# Patient Record
Sex: Male | Born: 1940 | Race: White | Hispanic: No | Smoking: Former smoker
Health system: Southern US, Community
[De-identification: ages and names within clinical notes are randomized; demographics above are authoritative.]

## PROBLEM LIST (undated history)

## (undated) DIAGNOSIS — I499 Cardiac arrhythmia, unspecified: Secondary | ICD-10-CM

## (undated) DIAGNOSIS — N400 Enlarged prostate without lower urinary tract symptoms: Secondary | ICD-10-CM

## (undated) DIAGNOSIS — G4752 REM sleep behavior disorder: Secondary | ICD-10-CM

## (undated) DIAGNOSIS — C7951 Secondary malignant neoplasm of bone: Secondary | ICD-10-CM

## (undated) DIAGNOSIS — C801 Malignant (primary) neoplasm, unspecified: Secondary | ICD-10-CM

## (undated) DIAGNOSIS — G4730 Sleep apnea, unspecified: Secondary | ICD-10-CM

## (undated) DIAGNOSIS — G20A1 Parkinson's disease without dyskinesia, without mention of fluctuations: Secondary | ICD-10-CM

## (undated) DIAGNOSIS — M8088XA Other osteoporosis with current pathological fracture, vertebra(e), initial encounter for fracture: Secondary | ICD-10-CM

## (undated) DIAGNOSIS — C61 Malignant neoplasm of prostate: Secondary | ICD-10-CM

## (undated) DIAGNOSIS — I4892 Unspecified atrial flutter: Secondary | ICD-10-CM

## (undated) DIAGNOSIS — G20 Parkinson's disease: Secondary | ICD-10-CM

## (undated) HISTORY — PX: COLONOSCOPY: SHX174

## (undated) HISTORY — DX: REM sleep behavior disorder: G47.52

## (undated) HISTORY — DX: Sleep apnea, unspecified: G47.30

## (undated) HISTORY — PX: APPENDECTOMY: SHX54

---

## 2001-08-17 ENCOUNTER — Ambulatory Visit (HOSPITAL_COMMUNITY): Admission: RE | Admit: 2001-08-17 | Discharge: 2001-08-17 | Payer: Self-pay | Admitting: Gastroenterology

## 2008-08-07 ENCOUNTER — Ambulatory Visit: Payer: Self-pay | Admitting: Internal Medicine

## 2008-10-04 ENCOUNTER — Ambulatory Visit: Payer: Self-pay | Admitting: Gastroenterology

## 2012-01-17 DIAGNOSIS — G4752 REM sleep behavior disorder: Secondary | ICD-10-CM | POA: Insufficient documentation

## 2012-01-17 DIAGNOSIS — G4733 Obstructive sleep apnea (adult) (pediatric): Secondary | ICD-10-CM | POA: Insufficient documentation

## 2013-09-07 DIAGNOSIS — K5790 Diverticulosis of intestine, part unspecified, without perforation or abscess without bleeding: Secondary | ICD-10-CM

## 2013-09-07 HISTORY — DX: Diverticulosis of intestine, part unspecified, without perforation or abscess without bleeding: K57.90

## 2014-01-12 DIAGNOSIS — N400 Enlarged prostate without lower urinary tract symptoms: Secondary | ICD-10-CM | POA: Insufficient documentation

## 2014-05-18 DIAGNOSIS — Z860100 Personal history of colon polyps, unspecified: Secondary | ICD-10-CM | POA: Insufficient documentation

## 2014-06-05 ENCOUNTER — Ambulatory Visit: Payer: Self-pay | Admitting: Gastroenterology

## 2015-05-09 HISTORY — PX: KIDNEY DONATION: SHX685

## 2015-05-20 DIAGNOSIS — Z524 Kidney donor: Secondary | ICD-10-CM | POA: Insufficient documentation

## 2015-06-10 DIAGNOSIS — Z905 Acquired absence of kidney: Secondary | ICD-10-CM | POA: Insufficient documentation

## 2015-11-07 DIAGNOSIS — G20 Parkinson's disease: Secondary | ICD-10-CM | POA: Insufficient documentation

## 2015-11-29 ENCOUNTER — Ambulatory Visit: Payer: Medicare Other | Admitting: Occupational Therapy

## 2015-11-29 ENCOUNTER — Ambulatory Visit: Payer: Medicare Other | Admitting: Physical Therapy

## 2015-12-03 ENCOUNTER — Encounter: Payer: Self-pay | Admitting: Occupational Therapy

## 2015-12-03 ENCOUNTER — Ambulatory Visit: Payer: Medicare Other | Attending: Neurology | Admitting: Physical Therapy

## 2015-12-03 ENCOUNTER — Ambulatory Visit: Payer: Medicare Other | Admitting: Occupational Therapy

## 2015-12-03 DIAGNOSIS — R2689 Other abnormalities of gait and mobility: Secondary | ICD-10-CM | POA: Insufficient documentation

## 2015-12-03 DIAGNOSIS — R293 Abnormal posture: Secondary | ICD-10-CM | POA: Insufficient documentation

## 2015-12-03 DIAGNOSIS — R2681 Unsteadiness on feet: Secondary | ICD-10-CM

## 2015-12-03 DIAGNOSIS — R279 Unspecified lack of coordination: Principal | ICD-10-CM

## 2015-12-03 DIAGNOSIS — R278 Other lack of coordination: Secondary | ICD-10-CM

## 2015-12-03 NOTE — Therapy (Signed)
[name] [name] [address] [place], [place], [address] Phone: [phone]   Fax:  [phone]  Occupational Therapy Evaluation  Patient Details  Name: [name] [name] MRN: [id number] Date of Birth: 21-Nov-1940 Referring Provider: Dr. Erie Noe  Encounter Date: 12/03/2015      OT End of Session - 12/03/15 1301    Visit Number 1   Number of Visits 17   Authorization Type Medicare, Tricare (g-code)   Authorization - Visit Number 1   Authorization - Number of Visits 10   OT Start Time 0850   OT Stop Time 0933   OT Time Calculation (min) 43 min   Activity Tolerance Patient tolerated treatment well   Behavior During Therapy [facility] for tasks assessed/performed      Past Medical History  Diagnosis Date  . Sleep apnea   . REM sleep behavior disorder     Past Surgical History  Procedure Laterality Date  . Appendectomy    . 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  Visit Diagnosis:  Decreased coordination  Unsteadiness on feet  Abnormal posture      Subjective Assessment - 12/03/15 0854    Subjective  Pt reports that he has had PD symptoms for approx 2 years and was diagnosed approx a month ago   Patient Stated Goals improve writing   Currently in Pain? No/denies           [facility] OT Assessment - 12/03/15 0854    Assessment   Diagnosis Parkinsons disease   Referring Provider Dr. Erie Noe   Onset Date --  a month ago diagnosis   Prior Therapy none   Precautions   Precautions Fall   Balance Screen   Has the patient fallen in the past 6 months No   Home  Environment   Family/patient expects to be discharged to: Private residence   Lives With Spouse   Prior Function   Level of Independence Independent with household mobility without device;Independent with community mobility without device;Independent with basic ADLs;Independent with homemaking with ambulation    Vocation Retired   [employer] retired Chief Financial Officer    Leisure Enjoys outside [employer], [place] grass; bill paying/paperwork activities  big walking, sit-ups, barbells for exercise   ADL   Eating/Feeding Modified independent  eating with L hand approx 50% of the time due to tremor   Grooming Modified independent  using both hands for shaving   Grooming Patient Percentage --  difficulty manipulating toothbrush   Upper Body Bathing Modified independent   Lower Body Bathing Modified independent   Upper Body Dressing Increased time  min difficulty with button   Lower Body Dressing Increased time   Engineering geologist -  Hydrographic surveyor Walk in shower   Transfers/Ambulation Related to ADL's mod I   IADL   Shopping Takes care of all shopping needs independently   Prior Level of Function Light Housekeeping --  house cleaners, but pt/wife share simple tasks   Light Housekeeping --  incr time for yard work/home maintence tasks   Prior Level of Function Meal Prep wife performs, but pt helps   Meal Prep --  incr time, particularly with cutting    [place] own vehicle  pt reports tremors R foot, but not limiting driving   Medication Management Is responsible for taking medication in correct dosages at correct  time   Financial Management --  difficulty writing   Mobility   Mobility Status Independent   Written Expression   Dominant Hand Right   Handwriting Increased time  prints more now, reports decr size if writing longer    Written Experience --  pt wrote 3 sentences   Vision - History   Baseline Vision Wears glasses all the time  trifocals   Additional Comments denies changes   Activity Tolerance   Activity Tolerance Comments pt reports incr fatigue   Cognition   Overall Cognitive Status Within Functional Limits for tasks assessed  pt denies changes    Observation/Other Assessments   Observations Posture:  forward head, rounded shoulders, posterior pelvic tilt   Standing Functional Reach Test R-14.5", L-13.5"   Other Surveys  Select   Physical Performance Test   Yes   Simulated Eating Time (seconds) 20.16sec   Donning Doffing Jacket Time (seconds) 13.81   Donning Doffing Jacket Comments Buttoning/unbuttoning 3 buttons on table in 21.25sec   Coordination   9 Hole Peg Test Right;Left   Right 9 Hole Peg Test 40.16sec   Left 9 Hole Peg Test 29.34sec   Box and Blocks R-36blocks, L-45blocks   Coordination Pt demo small amplitudemovements when manipulating items in pocket and picking up jacket (R hand)   Tone   Assessment Location Right Upper Extremity;Left Upper Extremity   ROM / Strength   AROM / PROM / Strength AROM   AROM   Overall AROM  Deficits   Overall AROM Comments BUEs grossly WNL except R shoulder flex 125* with -20* elbow extension (elbow ext improved with cues) which appears to be due to bradykinesia/rigidity (LUE 140* with full elbow ext without cues)   RUE Tone   RUE Tone Mild   LUE Tone   LUE Tone Within Functional Limits                         OT Education - 12/03/15 1252    Education provided Yes   Education Details POC, Eval results   Person(s) Educated Patient   Methods Explanation;Demonstration;Verbal cues   Comprehension Verbalized understanding;Returned demonstration          OT Short Term Goals - 12/03/15 1331    OT SHORT TERM GOAL #1   Title Pt will be independent with PD-specific HEP--check STGs 01/02/16   Time 4   Period Weeks   Status New   OT SHORT TERM GOAL #2   Title Pt will improve R shoulder flex to at least 135 with -10* elbow extension for functional reaching (without cues)   Baseline 125* with -20* elbow ext   Time 4   Period Weeks   Status New   OT SHORT TERM GOAL #3   Title Pt will improve coordination for ADLs as shown by improving time on 9-hole peg test by at  least 5sec with R hand.   Baseline 40.16sec   Time 4   Period Weeks   Status New   OT SHORT TERM GOAL #4   Title Pt will improve functional reaching/coordination for ADLs as shown by improving score on box and blocks test by at least 5 with RUE.   Baseline 36   Time 4   Period Weeks   Status New   OT SHORT TERM GOAL #5   Title Pt will demo incr ease with eating as shown by improving time on PPT#2 by at least 4sec.   Baseline 20.16sec  Time 4   Period Weeks   Status New   Additional Short Term Goals   Additional Short Term Goals Yes   OT SHORT TERM GOAL #6   Title Pt will be able to write 1/2 page with no significant decr in size.   Time 4   Period Weeks   Status New           OT Long Term Goals - 29-Dec-2015 1640    OT LONG TERM GOAL #1   Title Pt will verbalize understanding of adaptive strategies to incr ease of ADLs/IADLs prn.   Time 8   Period Weeks   Status New   OT LONG TERM GOAL #2   Title Pt will demo incr ability to dress as shown by improving PPT#4 by at least 4sec   Baseline 21.25sec   Time 8   Period Weeks   Status New   OT LONG TERM GOAL #3   Title Pt will improve coordination for ADLs as shown by improving time on 9-hole peg test by at least 10sec with R hand.   Baseline 40.16   Time 8   Period Weeks   Status New   OT LONG TERM GOAL #4   Title Pt will improve functional reaching/coordination for ADLs as shown by improving score on box and blocks test by at least 10 with RUE.   Baseline 36   Time 8   Period Weeks   Status New   OT LONG TERM GOAL #5   Title Pt will report eating with dominant RUE at least 90% of the time.   Baseline 50%   Time 8   Period Weeks   Status New   Long Term Additional Goals   Additional Long Term Goals Yes   OT LONG TERM GOAL #6   Title Pt will verbalize understanding of ways to prevent future complications and PD-specific community resources prn.   Time 8   Period Weeks   Status New               Plan  - 2015/12/29 1303    Clinical Impression Statement Pt is a 75 y.o. male recently diagnosed with Parkinsons disease.  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  Pt presents with bradykinesia, rigidity, decr coordination, decr balance for ADLs, abnormal posture.  Pt would benefit from occupational therapy to address these deficits in order to improve ADL/IADL performance, establish PD-specific HEP, improve dominant RUE functional use, amd prevent future complications.   Pt will benefit from skilled therapeutic intervention in order to improve on the following deficits (Retired) Decreased mobility;Impaired UE functional use;Decreased knowledge of use of DME;Decreased balance;Decreased activity tolerance;Impaired tone;Decreased coordination;Decreased range of motion;Improper spinal/pelvic alignment  bradykinesia   Rehab Potential Good   OT Frequency 2x / week   OT Duration 8 weeks  +eval   OT Treatment/Interventions Self-care/ADL training;Neuromuscular education;Balance training;Patient/family education;Therapist, nutritional;Therapeutic exercise;Therapeutic exercises;Therapeutic activities;DME and/or AE instruction;Cryotherapy;Electrical Stimulation;Moist Heat;Passive range of motion;Cognitive remediation/compensation;Ultrasound;Energy conservation;Manual Therapy   Plan initiate PWR! HEP   Consulted and Agree with Plan of Care Patient          G-Codes - 12-29-2015 1648    Functional Assessment Tool Used 9-hole peg test:  R-40.16sec, box and blocks test: 36blocks, only eating with RUE 50% of the time   Functional Limitation Carrying, moving and handling objects   Carrying, Moving and Handling Objects Current Status [id number]) At least 20 percent but less than 40 percent  impaired, limited or restricted   Carrying, Moving and Handling Objects Goal Status [phone]) At least 1 percent but less than 20 percent impaired, limited or restricted      Problem  List There are no active problems to display for this patient.   [facility] 12/03/2015, 4:51 PM  [place] [address] [place], [place], [address] Phone: [phone]   Fax:  [phone]  Name: [name] [name] MRN: [id number] Date of Birth: 1941-09-01  Vianne Bulls, OTR/L [facility] [address] [place], [place]  [phone] phone [phone] 12/03/2015 4:51 PM

## 2015-12-03 NOTE — Therapy (Signed)
[facility] [address] [place], [place], [address] Phone: [phone]   Fax:  [phone]  Physical Therapy Evaluation  Patient Details  Name: [name] [name] MRN: [id number] Date of Birth: 01/23/1941 Referring Provider: Dr. Erie Noe ([place])  Encounter Date: 12/03/2015      PT End of Session - 12/03/15 0950    Visit Number 1   Number of Visits 17   Date for PT Re-Evaluation 02/01/16   Authorization Type Medicare/Tricare 2nd-GCODE every 10th visit; No PTA   PT Start Time 0801   PT Stop Time 0844   PT Time Calculation (min) 43 min   Activity Tolerance Patient tolerated treatment well   Behavior During Therapy [facility] for tasks assessed/performed      No past medical history on file.  No past surgical history on file.  There were no vitals filed for this visit.  Visit Diagnosis:  Other abnormalities of gait and mobility  Unsteadiness on feet  Abnormal posture      Subjective Assessment - 12/03/15 0807    Subjective Pt is a 75 year old male with history of Parkinson's disease, who presents to OP PT with some reports of R foot drag, R foot tremors, R hand tremors.  He was diagnosed with PD about one month ago, despite concerns over the past several years.  He notes some minimal changes in balance, slowed movements in general.   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   Currently in Pain? No/denies            [facility] PT Assessment - 12/03/15 0001    Assessment   Medical Diagnosis REM behavioral disorder; Parkinson's disease   Referring Provider Dr. Erie Noe ([place])   Onset Date/Surgical Date --  one month ago-PD dx   Precautions   Precautions Fall   Balance Screen   Has the patient fallen in the past 6 months No    Has the patient had a decrease in activity level because of a fear of falling?  No   Is the patient reluctant to leave their home because of a fear of falling?  No   Home Environment   Living Environment Private residence   Living Arrangements Spouse/significant other   Available Help at Discharge Family   Type of [place] to enter   Entrance Stairs-Number of Steps 5   Entrance Stairs-Rails Right   [place] Two level   Prior Function   Level of Independence Independent with household mobility without device;Independent with community mobility without device;Independent with basic ADLs   Vocation Retired   Leisure Enjoys outside [employer], mowing grass; Location manager activities   Posture/Postural Control   Posture/Postural Control Postural limitations   Postural Limitations Rounded Shoulders;Forward head;Posterior pelvic tilt   ROM / Strength   AROM / PROM / Strength PROM;Strength   PROM   Overall PROM Comments grossly tested, noted stiffness RLE> LLE   Strength   Overall Strength Comments Grossly tested at least 4/5 throughout lower extremities; pt does note fatigue more quickly with activities than he used to.   Transfers   Transfers Sit to Stand;Stand to Sit   Sit to Stand 6: Modified independent (Device/Increase time);Without upper extremity assist;From chair/3-in-1   Five time sit to stand comments  8.91 sec   Stand to Sit 6: Modified independent (Device/Increase time);Without upper extremity assist;To chair/3-in-1   Ambulation/Gait   Ambulation/Gait Yes   Ambulation/Gait Assistance 7: Independent   Ambulation Distance (Feet) 200 Feet   Assistive device None   Gait Pattern Decreased arm swing - right;Decreased step length - right;Decreased stance time - right;Decreased dorsiflexion - right;Decreased trunk rotation;Narrow base of support;Poor foot clearance - right  R foot scuffs floor with gait   Ambulation Surface Level;Indoor   Gait  velocity 9.54 sec= 3.44 ft/sec   Standardized Balance Assessment   Standardized Balance Assessment Timed Up and Go Test;Four Square Step Test   Timed Up and Go Test   Normal TUG (seconds) 10.74   Manual TUG (seconds) 11.5   Cognitive TUG (seconds) 12.74   Four Square Step Test    Trial One  11.58   Trial Two 10.72   High Level Balance   High Level Balance Comments Push and release test:  posterior and anterior:  pt takes 2 small steps to recover   Functional Gait  Assessment   Gait assessed  Yes   Gait Level Surface Walks 20 ft in less than 7 sec but greater than 5.5 sec, uses assistive device, slower speed, mild gait deviations, or deviates 6-10 in outside of the 12 in walkway width.  6 sec   Change in Gait Speed Able to change speed, demonstrates mild gait deviations, deviates 6-10 in outside of the 12 in walkway width, or no gait deviations, unable to achieve a major change in velocity, or uses a change in velocity, or uses an assistive device.   Gait with Horizontal Head Turns Performs head turns with moderate changes in gait velocity, slows down, deviates 10-15 in outside 12 in walkway width but recovers, can continue to walk.   Gait with Vertical Head Turns Performs task with slight change in gait velocity (eg, minor disruption to smooth gait path), deviates 6 - 10 in outside 12 in walkway width or uses assistive device   Gait and Pivot Turn Pivot turns safely within 3 sec and stops quickly with no loss of balance.   Step Over Obstacle Is able to step over one shoe box (4.5 in total height) but must slow down and adjust steps to clear box safely. May require verbal cueing.   Gait with Narrow Base of Support Ambulates 7-9 steps.   Gait with Eyes Closed Walks 20 ft, slow speed, abnormal gait pattern, evidence for imbalance, deviates 10-15 in outside 12 in walkway width. Requires more than 9 sec to ambulate 20 ft.  9.04 sec; veers to L   Ambulating Backwards Walks 20 ft, uses assistive  device, slower speed, mild gait deviations, deviates 6-10 in outside 12 in walkway width.  12.72 sec   Steps Alternating feet, must use rail.  decreased foot clearance going up steps   Total Score 18   FGA comment: Scores <22/30 indicate increased fall risk                             PT Short Term Goals - 12/03/15 0958    PT SHORT TERM GOAL #1   Title Pt will be independent with HEP for improved balance, gait, functional mobility.  TARGET 01/02/16   Time 4   Period Weeks   Status New   PT SHORT TERM GOAL #2   Title Pt will improve TUG and TUG cognitive score to <10% difference  to demonstrate improved dual tasking with gait activities.   Time 4   Period Weeks   Status New   PT SHORT TERM GOAL #3   Title Pt will demonstrate improved balance recovery, being able to take <2 steps in posterior and anterior push and release test.   Time 4   Period Weeks   Status New   PT SHORT TERM GOAL #4   Title Pt will verbalize understanding of local Parkinson's disease related resources.   Time 4   Period Weeks   Status New           PT Long Term Goals - 2015-12-12 1000    PT LONG TERM GOAL #1   Title Pt will verbalize understanding of fall prevention in the home environment.  TARGET 02/01/16   Time 8   Period Weeks   Status New   PT LONG TERM GOAL #2   Title Pt will improve Functional Gait Assessment to at least 22/30 for improved dynamic balance and gait.   Time 8   Period Weeks   Status New   PT LONG TERM GOAL #3   Title 6 minute walk test to be completed, with pt improving distance in 6 minute walk test by at least 50 ft for improved gait efficiency.   Time 8   Period Weeks   Status New   PT LONG TERM GOAL #4   Title Pt will verbalize understanding of optimal fitness plans upon D/C from PT.   Time 8   Period Weeks   Status New               Plan - 2015-12-12 0951    Clinical Impression Statement Pt is a 75 year old male who presents to OP PT with  recent diagnosis (within past month) for moderate complexity PT evaluation.  Pt presents with bradykinesia, tremor, rigidity, decreased timing and coordination of gait, decreased dynamic balance, postural instability with balance strategies, abnormal posture.  Pt demonstrates difficulty with dual tasking with >10% difference on TUG and TUG cognitive scores.  Pt is at fall risk per Functional Gait Assessment score.  Pt would benefit from further skilled PT to address the above stated deficits to improve functional mobility and to decrease risk of falls.   Pt will benefit from skilled therapeutic intervention in order to improve on the following deficits Abnormal gait;Decreased balance;Decreased mobility;Difficulty walking;Impaired flexibility;Postural dysfunction   Rehab Potential Good   PT Frequency 2x / week  1x/wk 1 week, then    PT Duration 8 weeks   PT Treatment/Interventions ADLs/Self Care Home Management;Therapeutic exercise;Therapeutic activities;Functional mobility training;Gait training;Balance training;Neuromuscular re-education;Patient/family education   PT Next Visit Plan Initiate PWR! Moves (coordinate with OT) for large amplitude movement patterns; gait with large amplitude foot clearance and arm swing; perform 6 minute walk test   Consulted and Agree with Plan of Care Patient          G-Codes - 12/12/15 1005    Functional Assessment Tool Used Functional Gait Assessment 18/30; TUG 10.74 sec, TUG cog 12.74 sec; push and release test:  2+ small steps with therapist assistance-forward and posterior directions.   Functional Limitation Mobility: Walking and moving around   Mobility: Walking and Moving Around Current Status [phone]) At least 20 percent but less than 40 percent impaired, limited or restricted   Mobility: Walking and Moving Around Goal Status [id number]) At least 1 percent but less than 20 percent impaired, limited or restricted  Problem List There are no active  problems to display for this patient.   Gyselle Matthew W. 12/03/2015, 10:06 AM Frazier Butt., PT [place] [address] [place], [place], [address] Phone: [phone]   Fax:  [phone]  Name: [name] [name] MRN: [id number] Date of Birth: [birth year]

## 2015-12-06 ENCOUNTER — Ambulatory Visit: Payer: Medicare Other | Admitting: Physical Therapy

## 2015-12-06 DIAGNOSIS — R293 Abnormal posture: Secondary | ICD-10-CM

## 2015-12-06 DIAGNOSIS — R2689 Other abnormalities of gait and mobility: Secondary | ICD-10-CM | POA: Diagnosis not present

## 2015-12-06 DIAGNOSIS — R2681 Unsteadiness on feet: Secondary | ICD-10-CM

## 2015-12-06 NOTE — Therapy (Signed)
[facility] [address] [place], [place], [address] Phone: [phone]   Fax:  [phone]  Physical Therapy Treatment  Patient Details  Name: [name] [name] MRN: [id number] Date of Birth: 13-Apr-1941 Referring Provider: Dr. Erie Noe ([place])  Encounter Date: 12/06/2015      PT End of Session - 12/06/15 1159    Visit Number 2   Number of Visits 17   Date for PT Re-Evaluation 02/01/16   Authorization Type Medicare/Tricare 2nd-GCODE every 10th visit; No PTA   PT Start Time 0806   PT Stop Time 0844   PT Time Calculation (min) 38 min   Activity Tolerance Patient tolerated treatment well   Behavior During Therapy [facility] for tasks assessed/performed      Past Medical History  Diagnosis Date  . Sleep apnea   . REM sleep behavior disorder     Past Surgical History  Procedure Laterality Date  . Appendectomy    . 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  Visit Diagnosis:  Other abnormalities of gait and mobility  Abnormal posture  Unsteadiness on feet      Subjective Assessment - 12/06/15 1154    Subjective No changes since last visit-trying to walk with bigger effort to clear my feet.   Patient Stated Goals Pt's goals for therapy are to get started on regular exercise disease to control PD symptoms.   Currently in Pain? No/denies                         [facility] Adult PT Treatment/Exercise - 12/06/15 0834    Ambulation/Gait   Ambulation/Gait Yes   Ambulation/Gait Assistance 7: Independent   Ambulation Distance (Feet) 1300 Feet  400 ft, 200 ft   Assistive device None  use of walking poles to facilitate arm swing   Gait Pattern Decreased arm swing - right;Decreased step length - right;Decreased stance time - right;Decreased dorsiflexion - right;Decreased trunk rotation;Narrow base of support;Poor foot clearance - right  foot scuffs floor with gait    Ambulation Surface Level;Indoor   Gait Comments Intentional gait with cues for increased stride, increased heelstrike, relaxed armswing with improved trunk rotation with gait.  Utilized bilateral walking poles to initially cue pt in improved reciprocal arm swing.   High Level Balance   High Level Balance Comments Stagger stance forward and back weightshift x 10 reps each foot position           PWR [facility]) - 12/06/15 [id number]    PWR! exercises Moves in standing   PWR! Up x 15   PWR! Rock x 10   PWR! Twist x 10   PWR Step x 10  Forward step and weightshift x 10 reps   Comments Verbal, visual cues provided for technique, maximal BIG, deliberate effort      PWR! Up for posture, PWR! Rock for [employer], [place]! Twist for trunk rotation, then PWR! Step for improved transition step.  Explained to patient connection with each PWR! Move to functional activities.       PT Education - 12/06/15 1158    Education provided Yes   Education Details walking pattern with household gait; PWR! Moves in standing   Person(s) Educated Patient   Methods Explanation;Demonstration;Handout   Comprehension Verbalized understanding;Returned demonstration;Verbal cues required          PT Short Term Goals - 12/03/15 0958    PT SHORT TERM GOAL #  1   Title Pt will be independent with HEP for improved balance, gait, functional mobility.  TARGET 01/02/16   Time 4   Period Weeks   Status New   PT SHORT TERM GOAL #2   Title Pt will improve TUG and TUG cognitive score to <10% difference to demonstrate improved dual tasking with gait activities.   Time 4   Period Weeks   Status New   PT SHORT TERM GOAL #3   Title Pt will demonstrate improved balance recovery, being able to take <2 steps in posterior and anterior push and release test.   Time 4   Period Weeks   Status New   PT SHORT TERM GOAL #4   Title Pt will verbalize understanding of local Parkinson's disease related resources.   Time 4   Period  Weeks   Status New           PT Long Term Goals - 12/03/15 1000    PT LONG TERM GOAL #1   Title Pt will verbalize understanding of fall prevention in the home environment.  TARGET 02/01/16   Time 8   Period Weeks   Status New   PT LONG TERM GOAL #2   Title Pt will improve Functional Gait Assessment to at least 22/30 for improved dynamic balance and gait.   Time 8   Period Weeks   Status New   PT LONG TERM GOAL #3   Title 6 minute walk test to be completed, with pt improving distance in 6 minute walk test by at least 50 ft for improved gait efficiency.   Time 8   Period Weeks   Status New   PT LONG TERM GOAL #4   Title Pt will verbalize understanding of optimal fitness plans upon D/C from PT.   Time 8   Period Weeks   Status New               Plan - 12/06/15 1159    Clinical Impression Statement Initiated HEP for standing PWR! Moves today for improved posture, weightshift, trunk rotation, transition step.  Pt performs exercises well, with cues for improved deliberate large amplitude movements.  Pt will continue to benefit from further skilled PT to address balance, gait, functional mobility.   Pt will benefit from skilled therapeutic intervention in order to improve on the following deficits Abnormal gait;Decreased balance;Decreased mobility;Difficulty walking;Impaired flexibility;Postural dysfunction   Rehab Potential Good   PT Frequency 2x / week  1x/wk 1 week, then    PT Duration 8 weeks   PT Treatment/Interventions ADLs/Self Care Home Management;Therapeutic exercise;Therapeutic activities;Functional mobility training;Gait training;Balance training;Neuromuscular re-education;Patient/family education   PT Next Visit Plan Review PWR! MOves in standing; add step and weightshift forward and try backward; also add stagger stance rock and reach for improved dynamic balance and arm swing   Consulted and Agree with Plan of Care Patient        Problem List There are no  active problems to display for this patient.   [name] [name] W. 12/06/2015, 12:04 PM  [facility]raz[facility] Butt., PT [facility] [address] [place], [place], [address] Phone: [phone]   Fax:  [phone]  Name: [name] [name] MRN: [id number] Date of Birth: 24-Sep-1940

## 2015-12-06 NOTE — Patient Instructions (Signed)
Provided patient with PWR! Moves exercises in standing: -PWR! Up x 20 -PWR! Rock x 20 -PWR! Twist x 20 -PWR! Step x 20  To be performed once per day   -Encouraged patient to utilize maximal large effort, large step length and arm swing, even with short, room-room distances.

## 2015-12-11 ENCOUNTER — Ambulatory Visit: Payer: Medicare Other | Attending: Neurology | Admitting: Rehabilitative and Restorative Service Providers"

## 2015-12-11 DIAGNOSIS — R2689 Other abnormalities of gait and mobility: Secondary | ICD-10-CM | POA: Diagnosis present

## 2015-12-11 DIAGNOSIS — R293 Abnormal posture: Secondary | ICD-10-CM

## 2015-12-11 DIAGNOSIS — R29898 Other symptoms and signs involving the musculoskeletal system: Secondary | ICD-10-CM | POA: Diagnosis present

## 2015-12-11 DIAGNOSIS — R278 Other lack of coordination: Secondary | ICD-10-CM | POA: Insufficient documentation

## 2015-12-11 DIAGNOSIS — R29818 Other symptoms and signs involving the nervous system: Secondary | ICD-10-CM | POA: Insufficient documentation

## 2015-12-11 DIAGNOSIS — R2681 Unsteadiness on feet: Secondary | ICD-10-CM | POA: Diagnosis present

## 2015-12-11 NOTE — Therapy (Signed)
[facility] [address] [place], [place], [address] Phone: [phone]   Fax:  [phone]  Physical Therapy Treatment  Patient Details  Name: [name] [name] MRN: [id number] Date of Birth: August 24, 1941 Referring Provider: Dr. Erie Noe ([facility])  Encounter Date: 12/11/2015      PT End of Session - 12/11/15 1441    Visit Number 3   Number of Visits 17   Date for PT Re-Evaluation 02/01/16   Authorization Type Medicare/Tricare 2nd-GCODE every 10th visit; No PTA   PT Start Time 0933   PT Stop Time 1015   PT Time Calculation (min) 42 min   Activity Tolerance Patient tolerated treatment well   Behavior During Therapy WFL for tasks assessed/performed      Past Medical History  Diagnosis Date  . Sleep apnea   . REM sleep behavior disorder     Past Surgical History  Procedure Laterality Date  . Appendectomy    . 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  Visit Diagnosis:  Other abnormalities of gait and mobility  Unsteadiness on feet  Abnormal posture      Subjective Assessment - 12/11/15 0937    Subjective The patient is doing HEP for PWR! Moves.  Patient reports his most difficult tasks involve use of R hand (writing and typing).   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   Currently in Pain? No/denies                         [facility] Adult PT Treatment/Exercise - 12/11/15 0001    Ambulation/Gait   Ambulation/Gait Yes   Ambulation/Gait Assistance 7: Independent   Ambulation Distance (Feet) 1500 Feet   Assistive device None   Gait Pattern --  Dec'd R arm swing and R foot clearance worse with multi-task   Ambulation Surface Level   Gait Comments Gait with verbal cues for R  heel strike and longer stride length.  Tactile cues for R arm swing.  Performed gait activities forwards/backwards walking, multi-tasking with ball toss with cues to attend to R foot clearance (which is worse during multi-tasking), and cues for direction changes.   Neuro Re-ed    Neuro Re-ed Details  Marching with large UE movement/alternating UE motion x 20 times.  Sidestepping with lunges and exaggerated UE motion            PWR [facility]) - 12/11/15 0956    PWR! exercises Moves in standing;Moves in [place]! Up x 10   PWR! Rock x 10   PWR! Twist x 10   PWR! Step x 10   Comments Repeated each activity with cues to extend all the way through R fingertips during PWR twist q-ped.   PWR! Up x 15   PWR Step x 10 lateral and ant/posterior   Reaching Comments Lateral step and reach posterior emphasizing opening hand and dynamic weight shifting.   PWR! Multi-Directional Movements with Dual Tasks Randomized PWR! Moves adding alternating LEs and multi-directions.  Patient has a more difficult time in random vs bocked practice.           LSVT [facility]) - 12/11/15 1440    Step and Reach Backward 10 reps   Rock and  Reach Forward/Backward 10 reps            PT Education - 12/11/15 1440    Education provided Yes   Education Details HEP: Added LSVT rock and reach exercise to HEP.   Person(s) Educated Patient   Methods Explanation;Demonstration;Handout   Comprehension Returned demonstration;Verbalized understanding          PT Short Term Goals - 12/03/15 0958    PT SHORT TERM GOAL #1   Title Pt will be independent with HEP for improved balance, gait, functional mobility.  TARGET 01/02/16   Time 4   Period Weeks   Status New   PT SHORT TERM GOAL #2   Title Pt will improve TUG and TUG cognitive score to <10% difference to demonstrate improved dual tasking with gait activities.   Time 4   Period Weeks   Status New   PT SHORT TERM GOAL #3   Title Pt will demonstrate improved  balance recovery, being able to take <2 steps in posterior and anterior push and release test.   Time 4   Period Weeks   Status New   PT SHORT TERM GOAL #4   Title Pt will verbalize understanding of local Parkinson's disease related resources.   Time 4   Period Weeks   Status New           PT Long Term Goals - 12/03/15 1000    PT LONG TERM GOAL #1   Title Pt will verbalize understanding of fall prevention in the home environment.  TARGET 02/01/16   Time 8   Period Weeks   Status New   PT LONG TERM GOAL #2   Title Pt will improve Functional Gait Assessment to at least 22/30 for improved dynamic balance and gait.   Time 8   Period Weeks   Status New   PT LONG TERM GOAL #3   Title 6 minute walk test to be completed, with pt improving distance in 6 minute walk test by at least 50 ft for improved gait efficiency.   Time 8   Period Weeks   Status New   PT LONG TERM GOAL #4   Title Pt will verbalize understanding of optimal fitness plans upon D/C from PT.   Time 8   Period Weeks   Status New               Plan - 12/11/15 1441    Clinical Impression Statement The patient is tolerating activities well in HEP and in therapy.  Patient improving gait with attention to task, multi-tasking is more challenging with R foot drag noted.  Continue towards STG/LTGs.   Pt will benefit from skilled therapeutic intervention in order to improve on the following deficits Abnormal gait;Decreased balance;Decreased mobility;Difficulty walking;Impaired flexibility;Postural dysfunction   PT Treatment/Interventions ADLs/Self Care Home Management;Therapeutic exercise;Therapeutic activities;Functional mobility training;Gait training;Balance training;Neuromuscular re-education;Patient/family education   PT Next Visit Plan Review PWR! HEP and rock and reach for HEP.  Multi-tasking during gait.  Quadriped trunk extension (PWR weight shift) for posture.   Consulted and Agree with Plan of Care Patient         Problem List There are no active problems to display for this patient.   [place], [place] 12/11/2015, 2:43 PM  [facility] [address] [place], [place], [address] Phone: [phone]   Fax:  [phone]  Name: [name] [name] MRN: [id number] Date of Birth: Jan 13, 1941

## 2015-12-12 ENCOUNTER — Ambulatory Visit: Payer: Medicare Other | Admitting: Occupational Therapy

## 2015-12-12 DIAGNOSIS — R293 Abnormal posture: Secondary | ICD-10-CM

## 2015-12-12 DIAGNOSIS — R29818 Other symptoms and signs involving the nervous system: Secondary | ICD-10-CM

## 2015-12-12 DIAGNOSIS — R29898 Other symptoms and signs involving the musculoskeletal system: Secondary | ICD-10-CM

## 2015-12-12 DIAGNOSIS — R2681 Unsteadiness on feet: Secondary | ICD-10-CM

## 2015-12-12 DIAGNOSIS — R278 Other lack of coordination: Secondary | ICD-10-CM

## 2015-12-12 DIAGNOSIS — R2689 Other abnormalities of gait and mobility: Secondary | ICD-10-CM | POA: Diagnosis not present

## 2015-12-12 NOTE — Patient Instructions (Signed)
PWR! Hands  With arms stretched out in front of you (elbows straight), perform the following:  PWR! Rock: Move wrists up and down [employer]! Twist: Twist palms up and down BIG  Then, start with elbows bent and hands closed.  PWR! Step: Touch index finger to thumb while keeping other fingers straight. Flick fingers out BIG (thumb out/straighten fingers). Repeat with other fingers. (Step your thumb to each finger).  PWR! Hands: Push hands out BIG. Elbows straight, wrists up, fingers open and spread apart BIG. (Can also perform by pushing down on table, chair, knees. Push above head, out to the side, behind you, in front of you.)   ** Make each movement big and deliberate so that you feel the movement.  Perform at least 10 repetitions 1x/day, but perform PWR! hands throughout the day when you are having trouble using your hands (picking up/manipulating small objects, writing, eating, typing, sewing, buttoning, etc.).   Coordination Exercises  Perform the following exercises for 1 times per day. Perform with both hand(s). Perform using big movements.   Flipping Cards: Place deck of cards on the table. Flip cards over by opening your hand big to grasp and then turn your palm up big, opening hand fully to release.  Deal cards: Hold 1/2 or whole deck in your hand. Use thumb to push card off top of deck with one big push.  Rotate ball with fingertips: Pick up with fingers/thumb and move as much as you can with each turn/movement (clockwise and counter-clockwise).

## 2015-12-13 ENCOUNTER — Ambulatory Visit: Payer: Medicare Other | Admitting: Rehabilitative and Restorative Service Providers"

## 2015-12-13 DIAGNOSIS — R2689 Other abnormalities of gait and mobility: Secondary | ICD-10-CM | POA: Diagnosis not present

## 2015-12-13 DIAGNOSIS — R2681 Unsteadiness on feet: Secondary | ICD-10-CM

## 2015-12-13 DIAGNOSIS — R293 Abnormal posture: Secondary | ICD-10-CM

## 2015-12-13 NOTE — Therapy (Signed)
[facility] [address] [place], [place], [address] Phone: [phone]   Fax:  [phone]  Physical Therapy Treatment  Patient Details  Name: [name] [name] MRN: [id number] Date of Birth: Mar 01, 1941 Referring Provider: Dr. Erie Noe ([facility])  Encounter Date: 12/13/2015      PT End of Session - 12/13/15 1453    Visit Number 4   Number of Visits 17   Date for PT Re-Evaluation 02/01/16   Authorization Type Medicare/Tricare 2nd-GCODE every 10th visit; No PTA   PT Start Time 1402   PT Stop Time 1443   PT Time Calculation (min) 41 min   Activity Tolerance Patient tolerated treatment well   Behavior During Therapy WFL for tasks assessed/performed      Past Medical History  Diagnosis Date  . Sleep apnea   . REM sleep behavior disorder     Past Surgical History  Procedure Laterality Date  . Appendectomy    . 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      Subjective Assessment - 12/13/15 1403    Subjective The patient is doing rock and reach HEP.  Patient had OT yesterday.  He was tired after PT 2 days ago.   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   Currently in Pain? No/denies          [facility] Adult PT Treatment/Exercise - 12/13/15 1455    Ambulation/Gait   Ambulation/Gait Yes   Ambulation/Gait Assistance 7: Independent   Ambulation Distance (Feet) 1500 Feet   Assistive device None   Gait Pattern --  Dec'd R arm swing and R foot clearance worse with multi-task   Ambulation Surface Level   Gait Comments Treadmill to warm up performing 2.7 mph with bilateral UE support working up to 4% incline x 5 minutes.  Gait with verbal cues for R heel strike and longer stride length.  Tactile cues for R  arm swing.  Performed gait activities forwards/backwards walking, multi-tasking with ball toss with cues to attend to R foot clearance (which is worse during multi-tasking), and cues for direction changes.   Neuro Re-ed    Neuro Re-ed Details  Sidestepping activities working on large amplitude LE foot clearance on R with side step.  Performed lateral step on/off compliant surfaces with SBA for safety.   Exercises   Exercises Other Exercises   Other Exercises  towel roll stretch supine           PWR [facility]) - 12/13/15 1457    PWR! exercises Moves in standing   PWR! Rock x 10   PWR Step x 10 lateral and anterior   Comments Verbal cues to extend through R fingers and for R foot clearance.         LSVT [facility]) - 12/13/15 1457    Step and Reach Backward 10 reps   Rock and Reach Forward/Backward 10 reps              PT Short Term Goals - 12/03/15 0958    PT SHORT TERM GOAL #1   Title Pt will be independent with HEP for improved balance, gait, functional mobility.  TARGET 01/02/16   Time 4   Period Weeks   Status New   PT SHORT TERM  GOAL #2   Title Pt will improve TUG and TUG cognitive score to <10% difference to demonstrate improved dual tasking with gait activities.   Time 4   Period Weeks   Status New   PT SHORT TERM GOAL #3   Title Pt will demonstrate improved balance recovery, being able to take <2 steps in posterior and anterior push and release test.   Time 4   Period Weeks   Status New   PT SHORT TERM GOAL #4   Title Pt will verbalize understanding of local Parkinson's disease related resources.   Time 4   Period Weeks   Status New           PT Long Term Goals - 12/03/15 1000    PT LONG TERM GOAL #1   Title Pt will verbalize understanding of fall prevention in the home environment.  TARGET 02/01/16   Time 8   Period Weeks   Status New   PT LONG TERM GOAL #2   Title Pt will improve Functional Gait Assessment to at least 22/30 for improved dynamic balance  and gait.   Time 8   Period Weeks   Status New   PT LONG TERM GOAL #3   Title 6 minute walk test to be completed, with pt improving distance in 6 minute walk test by at least 50 ft for improved gait efficiency.   Time 8   Period Weeks   Status New   PT LONG TERM GOAL #4   Title Pt will verbalize understanding of optimal fitness plans upon D/C from PT.   Time 8   Period Weeks   Status New               Plan - 12/13/15 1453    Clinical Impression Statement The patient has improved stride length with treadmill use and with walking poles emphasizing larger amplitude movements.   Continue working towards [employer].   Rehab Potential Good   PT Frequency 2x / week   PT Duration 8 weeks   PT Treatment/Interventions ADLs/Self Care Home Management;Therapeutic exercise;Therapeutic activities;Functional mobility training;Gait training;Balance training;Neuromuscular re-education;Patient/family education   PT Next Visit Plan Review PWR! HEP and rock and reach for HEP.  Multi-tasking during gait.  Quadriped trunk extension (PWR weight shift) for posture.   Consulted and Agree with Plan of Care Patient      Patient will benefit from skilled therapeutic intervention in order to improve the following deficits and impairments:  Abnormal gait, Decreased balance, Decreased mobility, Difficulty walking, Impaired flexibility, Postural dysfunction  Visit Diagnosis: Other abnormalities of gait and mobility  Unsteadiness on feet  Abnormal posture     Problem List There are no active problems to display for this patient.   [place], [place] 12/13/2015, 3:00 PM  [facility] [address] [place], [place], [address] Phone: [phone]   Fax:  [phone]  Name: [name] [name] MRN: [id number] Date of Birth: 1941-02-03

## 2015-12-13 NOTE — Therapy (Signed)
[facility] [address] [place], [place], [address] Phone: [phone]   Fax:  [phone]  Occupational Therapy Treatment  Patient Details  Name: [name] [name] MRN: [id number] Date of Birth: 1941-05-12 Referring Provider: Dr. Erie Noe  Encounter Date: 12/12/2015      OT End of Session - 12/13/15 1521    Visit Number 2   Number of Visits 17   Authorization Type Medicare, Tricare (g-code)   Authorization Time Period re-cert period 123456   Authorization - Visit Number 2   Authorization - Number of Visits 10   OT Start Time [id number]   OT Stop Time 1402   OT Time Calculation (min) 45 min   Activity Tolerance Patient tolerated treatment well   Behavior During Therapy [facility] for tasks assessed/performed      Past Medical History  Diagnosis Date  . Sleep apnea   . REM sleep behavior disorder     Past Surgical History  Procedure Laterality Date  . Appendectomy    . 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   Patient Stated Goals improve writing   Currently in Pain? No/denies                              OT Education - 12/13/15 1516    Education Details PWR! Moves in sitting (basic 4); PWR! hands (basic 4); initiated coordination HEP with focus on large amplitude movements--see pt instructions   Person(s) Educated Patient   Methods Explanation;Demonstration;Handout;Verbal cues   Comprehension Verbalized understanding;Returned demonstration;Verbal cues required  min cueing for large amplitude          OT Short Term Goals - 12/03/15 1331    OT SHORT TERM GOAL #1   Title Pt will be independent with PD-specific HEP--check STGs 01/02/16   Time 4   Period Weeks   Status New   OT  SHORT TERM GOAL #2   Title Pt will improve R shoulder flex to at least 135 with -10* elbow extension for functional reaching (without cues)   Baseline 125* with -20* elbow ext   Time 4   Period Weeks   Status New   OT SHORT TERM GOAL #3   Title Pt will improve coordination for ADLs as shown by improving time on 9-hole peg test by at least 5sec with R hand.   Baseline 40.16sec   Time 4   Period Weeks   Status New   OT SHORT TERM GOAL #4   Title Pt will improve functional reaching/coordination for ADLs as shown by improving score on box and blocks test by at least 5 with RUE.   Baseline 36   Time 4   Period Weeks   Status New   OT SHORT TERM GOAL #5   Title Pt will demo incr ease with eating as shown by improving time on PPT#2 by at least 4sec.   Baseline 20.16sec   Time 4   Period Weeks   Status New   Additional Short Term Goals   Additional Short Term Goals Yes   OT SHORT TERM GOAL #6   Title Pt will be able to write 1/2 page with no significant decr in size.  Time 4   Period Weeks   Status New           OT Long Term Goals - 12/03/15 1640    OT LONG TERM GOAL #1   Title Pt will verbalize understanding of adaptive strategies to incr ease of ADLs/IADLs prn.   Time 8   Period Weeks   Status New   OT LONG TERM GOAL #2   Title Pt will demo incr ability to dress as shown by improving PPT#4 by at least 4sec   Baseline 21.25sec   Time 8   Period Weeks   Status New   OT LONG TERM GOAL #3   Title Pt will improve coordination for ADLs as shown by improving time on 9-hole peg test by at least 10sec with R hand.   Baseline 40.16   Time 8   Period Weeks   Status New   OT LONG TERM GOAL #4   Title Pt will improve functional reaching/coordination for ADLs as shown by improving score on box and blocks test by at least 10 with RUE.   Baseline 36   Time 8   Period Weeks   Status New   OT LONG TERM GOAL #5   Title Pt will report eating with dominant RUE at least 90% of the  time.   Baseline 50%   Time 8   Period Weeks   Status New   Long Term Additional Goals   Additional Long Term Goals Yes   OT LONG TERM GOAL #6   Title Pt will verbalize understanding of ways to prevent future complications and PD-specific community resources prn.   Time 8   Period Weeks   Status New               Plan - 12/13/15 1522    Clinical Impression Statement Pt responded well to cueing for large amplitude movements during session and is progressing toward goals.   Rehab Potential Good   OT Frequency 2x / week   OT Duration 8 weeks  +eval   OT Treatment/Interventions Self-care/ADL training;Neuromuscular education;Balance training;Patient/family education;Therapist, nutritional;Therapeutic exercise;Therapeutic exercises;Therapeutic activities;DME and/or AE instruction;Cryotherapy;Electrical Stimulation;Moist Heat;Passive range of motion;Cognitive remediation/compensation;Ultrasound;Energy conservation;Manual Therapy   Plan review PWR! hands (basic 4) and continue instruction of coordination HEP   OT Home Exercise Plan Education provided:  Seated PWR! moves (basic 4), PWR! hands (basic 4), initiated coordination HEP with emphasis on large amplitude   Consulted and Agree with Plan of Care Patient      Patient will benefit from skilled therapeutic intervention in order to improve the following deficits and impairments:  Decreased mobility, Impaired UE functional use, Decreased knowledge of use of DME, Decreased balance, Decreased activity tolerance, Impaired tone, Decreased coordination, Decreased range of motion, Improper spinal/pelvic alignment (bradykinesia)  Visit Diagnosis: Other lack of coordination  Unsteadiness on feet  Abnormal posture  Other symptoms and signs involving the musculoskeletal system  Other symptoms and signs involving the nervous system    Problem List There are no active problems to display for this  patient.   [facility] 12/13/2015, 3:25 PM  [facility] [address] [place], [place], [address] Phone: [phone]   Fax:  [phone]  Name: [name] [name] MRN: [id number] Date of Birth: [birth year]  Vianne Bulls, OTR/L [facility] [address] [place], [place]  [address] [phone] phone [phone] 12/13/2015 3:25 PM

## 2015-12-16 ENCOUNTER — Ambulatory Visit: Payer: Medicare Other | Admitting: Physical Therapy

## 2015-12-16 DIAGNOSIS — R2689 Other abnormalities of gait and mobility: Secondary | ICD-10-CM | POA: Diagnosis not present

## 2015-12-16 DIAGNOSIS — R2681 Unsteadiness on feet: Secondary | ICD-10-CM

## 2015-12-16 DIAGNOSIS — R293 Abnormal posture: Secondary | ICD-10-CM

## 2015-12-16 NOTE — Therapy (Signed)
[place] [facility] [address] [place], [place], [address] Phone: [phone]   Fax:  [phone]  Physical Therapy Treatment  Patient Details  Name: [name] [name] MRN: [id number] Date of Birth: 1941-02-02 Referring Provider: Dr. Erie Noe ([place])  Encounter Date: 12/16/2015      PT End of Session - 12/16/15 1506    Visit Number 4   Number of Visits 17   Date for PT Re-Evaluation 02/01/16   Authorization Type Medicare/Tricare 2nd-GCODE every 10th visit; No PTA   PT Start Time 0850   PT Stop Time 0929   PT Time Calculation (min) 39 min   Activity Tolerance Patient tolerated treatment well   Behavior During Therapy [facility] for tasks assessed/performed      Past Medical History  Diagnosis Date  . Sleep apnea   . REM sleep behavior disorder     Past Surgical History  Procedure Laterality Date  . Appendectomy    . 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      Subjective Assessment - 12/16/15 0848    Subjective Been practicing the exercises, trying to get into a routine.  Trying to do outside walking.   Patient Stated Goals Pt's goals for therapy are to get started on regular exercise disease to control PD symptoms.   Currently in Pain? Yes   Pain Score 7    Pain Location Shoulder   Pain Orientation Right   Pain Descriptors / Indicators Aching   Pain Type Acute pain   Pain Onset In the past 7 days   Pain Frequency Intermittent   Aggravating Factors  Reaching with RUE   Pain Relieving Factors resting/keeping in neutral position                         [facility] Adult PT Treatment/Exercise - 12/16/15 0903    Ambulation/Gait   Ambulation/Gait Yes   Ambulation/Gait Assistance 7: Independent   Ambulation Distance (Feet) 1000 Feet  then 120 ft with focus on trunk rotation   Assistive device None   Gait Pattern Decreased arm swing - right;Decreased step length -  right;Decreased stance time - right;Decreased dorsiflexion - right;Decreased trunk rotation;Narrow base of support;Poor foot clearance - right  improved with cues   Ambulation Surface Level;Indoor   Pre-Gait Activities Step and stop with exaggerated arm swing, marching with reciprocal arm swing/knee taps, forward/back exaggerated walking with cues for improved coordination of UE/LEs.  Trunk rotation in corner, reaching across body for improved trunk rotation with gait.   Gait Comments Slowed gait, reverts back to decr. R step length and decre. R arm swing with cognitive task with gait   High Level Balance   High Level Balance Comments Stagger stance position forward and back weightshift, rock and reach with bilateral arm swing, 10 reps each leg.           PWR [facility]) - 12/16/15 [id number]    PWR! exercises Moves in standing   PWR! Up x 20   PWR! Rock x 20  cues for posture/positioning for RUE reach   [employer]! Twist x 20   PWR Step x 10  lateral step, anterior step, posterior step-10 each     PWR! Up for posture, PWR! Rock for [employer] and reach, PWR! Twist for trunk rotation, PWR! Step for transition step and balance.  Provided manual/tactile cues for hand placement/positioning, to decreased R shoulder pain (pt has  no c/o pain in R shoulder during PT session)        PT Education - 12/16/15 1505    Education provided Yes   Education Details Review of HEP; added forward and back step and reach to HEP   Person(s) Educated Patient   Methods Explanation;Demonstration;Handout   Comprehension Verbalized understanding;Returned demonstration;Verbal cues required          PT Short Term Goals - 12/03/15 0958    PT SHORT TERM GOAL #1   Title Pt will be independent with HEP for improved balance, gait, functional mobility.  TARGET 01/02/16   Time 4   Period Weeks   Status New   PT SHORT TERM GOAL #2   Title Pt will improve TUG and TUG cognitive score to <10% difference to demonstrate improved  dual tasking with gait activities.   Time 4   Period Weeks   Status New   PT SHORT TERM GOAL #3   Title Pt will demonstrate improved balance recovery, being able to take <2 steps in posterior and anterior push and release test.   Time 4   Period Weeks   Status New   PT SHORT TERM GOAL #4   Title Pt will verbalize understanding of local Parkinson's disease related resources.   Time 4   Period Weeks   Status New           PT Long Term Goals - 12/03/15 1000    PT LONG TERM GOAL #1   Title Pt will verbalize understanding of fall prevention in the home environment.  TARGET 02/01/16   Time 8   Period Weeks   Status New   PT LONG TERM GOAL #2   Title Pt will improve Functional Gait Assessment to at least 22/30 for improved dynamic balance and gait.   Time 8   Period Weeks   Status New   PT LONG TERM GOAL #3   Title 6 minute walk test to be completed, with pt improving distance in 6 minute walk test by at least 50 ft for improved gait efficiency.   Time 8   Period Weeks   Status New   PT LONG TERM GOAL #4   Title Pt will verbalize understanding of optimal fitness plans upon D/C from PT.   Time 8   Period Weeks   Status New               Plan - 12/16/15 1507    Clinical Impression Statement With dual tasking activities, pt has difficulty with keeping large amplitude movement pattern of RUE and RLE.  Pt will continue to benefit from further skilled PT to address balance and gait and dual tasking activities and to conitnue to update HEP.   Rehab Potential Good   PT Frequency 2x / week   PT Duration 8 weeks   PT Treatment/Interventions ADLs/Self Care Home Management;Therapeutic exercise;Therapeutic activities;Functional mobility training;Gait training;Balance training;Neuromuscular re-education;Patient/family education   PT Next Visit Plan   Multi-tasking during gait, while maintaining large amplitude foot clearance/arm swing;  Quadriped trunk extension (PWR weight shift)  for posture.; may consider compliant surface work   Oncologist with Plan of Care Patient      Patient will benefit from skilled therapeutic intervention in order to improve the following deficits and impairments:  Abnormal gait, Decreased balance, Decreased mobility, Difficulty walking, Impaired flexibility, Postural dysfunction  Visit Diagnosis: Other abnormalities of gait and mobility  Abnormal posture  Unsteadiness on feet  Problem List There are no active problems to display for this patient.   [place] [facility] W. 12/16/2015, 3:11 PM Frazier Butt., PT [place] [address] [place], [place], [address] Phone: [phone]   Fax:  [phone]  Name: [name] [name] MRN: [id number] Date of Birth: Jan 30, 1941

## 2015-12-16 NOTE — Patient Instructions (Signed)
Provided patient with forward step and reach exercise, x 10 reps each leg, once per day  Provided patient with backward step and reach exercise, x 10 reps each leg, once per day

## 2015-12-24 ENCOUNTER — Ambulatory Visit: Payer: Medicare Other | Admitting: Occupational Therapy

## 2015-12-24 ENCOUNTER — Ambulatory Visit: Payer: Medicare Other | Admitting: Physical Therapy

## 2015-12-24 DIAGNOSIS — R2689 Other abnormalities of gait and mobility: Secondary | ICD-10-CM | POA: Diagnosis not present

## 2015-12-24 DIAGNOSIS — R29898 Other symptoms and signs involving the musculoskeletal system: Secondary | ICD-10-CM

## 2015-12-24 DIAGNOSIS — R278 Other lack of coordination: Secondary | ICD-10-CM

## 2015-12-24 DIAGNOSIS — R29818 Other symptoms and signs involving the nervous system: Secondary | ICD-10-CM

## 2015-12-24 NOTE — Therapy (Signed)
[facility] [address] [place], [place], [address] Phone: [phone]   Fax:  [phone]  Occupational Therapy Treatment  Patient Details  Name: [name] [name] MRN: [id number] Date of Birth: 12/24/1940 Referring Provider: Dr. Erie Noe  Encounter Date: 12/24/2015      OT End of Session - 12/24/15 1036    Visit Number 3   Number of Visits 17   Authorization Type Medicare, Tricare (g-code)   Authorization Time Period re-cert period 123456   Authorization - Visit Number 3   Authorization - Number of Visits 10   OT Start Time 1020   OT Stop Time 1100   OT Time Calculation (min) 40 min   Activity Tolerance Patient tolerated treatment well   Behavior During Therapy [facility] for tasks assessed/performed      Past Medical History  Diagnosis Date  . Sleep apnea   . REM sleep behavior disorder     Past Surgical History  Procedure Laterality Date  . Appendectomy    . 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   Patient Stated Goals improve writing   Currently in Pain? No/denies                              OT Education - 12/24/15 1353    Education Details Added to Coordination HEP and reviewed previous coordination and PWR! hands HEP--see pt instructions;  Use of exercise, trunk movements to prevent future complications (exercise as medicine)   Person(s) Educated Patient   Methods Explanation;Demonstration;Verbal cues;Handout   Comprehension Verbalized understanding;Returned demonstration;Verbal cues required  for incr movement amplitude          OT Short Term Goals - 12/03/15 1331    OT SHORT TERM GOAL #1   Title Pt will be  independent with PD-specific HEP--check STGs 01/02/16   Time 4   Period Weeks   Status New   OT SHORT TERM GOAL #2   Title Pt will improve R shoulder flex to at least 135 with -10* elbow extension for functional reaching (without cues)   Baseline 125* with -20* elbow ext   Time 4   Period Weeks   Status New   OT SHORT TERM GOAL #3   Title Pt will improve coordination for ADLs as shown by improving time on 9-hole peg test by at least 5sec with R hand.   Baseline 40.16sec   Time 4   Period Weeks   Status New   OT SHORT TERM GOAL #4   Title Pt will improve functional reaching/coordination for ADLs as shown by improving score on box and blocks test by at least 5 with RUE.   Baseline 36   Time 4   Period Weeks   Status New   OT SHORT TERM GOAL #5   Title Pt will demo incr ease with eating as shown by improving time on PPT#2 by at least 4sec.   Baseline 20.16sec   Time 4   Period Weeks   Status New   Additional Short Term Goals   Additional Short Term Goals Yes   OT SHORT TERM GOAL #6  Title Pt will be able to write 1/2 page with no significant decr in size.   Time 4   Period Weeks   Status New           OT Long Term Goals - 12/03/15 1640    OT LONG TERM GOAL #1   Title Pt will verbalize understanding of adaptive strategies to incr ease of ADLs/IADLs prn.   Time 8   Period Weeks   Status New   OT LONG TERM GOAL #2   Title Pt will demo incr ability to dress as shown by improving PPT#4 by at least 4sec   Baseline 21.25sec   Time 8   Period Weeks   Status New   OT LONG TERM GOAL #3   Title Pt will improve coordination for ADLs as shown by improving time on 9-hole peg test by at least 10sec with R hand.   Baseline 40.16   Time 8   Period Weeks   Status New   OT LONG TERM GOAL #4   Title Pt will improve functional reaching/coordination for ADLs as shown by improving score on box and blocks test by at least 10 with RUE.   Baseline 36   Time 8   Period Weeks    Status New   OT LONG TERM GOAL #5   Title Pt will report eating with dominant RUE at least 90% of the time.   Baseline 50%   Time 8   Period Weeks   Status New   Long Term Additional Goals   Additional Long Term Goals Yes   OT LONG TERM GOAL #6   Title Pt will verbalize understanding of ways to prevent future complications and PD-specific community resources prn.   Time 8   Period Weeks   Status New               Plan - 12/24/15 1036    Clinical Impression Statement Pt demo improvement with previously issued coordination and PWR! hands HEP and responds well to large amplitude movements.   Rehab Potential Good   OT Frequency 2x / week   OT Duration 8 weeks  +eval   OT Treatment/Interventions Self-care/ADL training;Neuromuscular education;Balance training;Patient/family education;Therapist, nutritional;Therapeutic exercise;Therapeutic exercises;Therapeutic activities;DME and/or AE instruction;Cryotherapy;Electrical Stimulation;Moist Heat;Passive range of motion;Cognitive remediation/compensation;Ultrasound;Energy conservation;Manual Therapy   Plan PWR! moves in quadraped, large amplitude movements for ADLs   OT Home Exercise Plan Education provided:  Seated PWR! moves (basic 4), PWR! hands (basic 4), initiated coordination HEP with emphasis on large amplitude   Consulted and Agree with Plan of Care Patient      Patient will benefit from skilled therapeutic intervention in order to improve the following deficits and impairments:  Decreased mobility, Impaired UE functional use, Decreased knowledge of use of DME, Decreased balance, Decreased activity tolerance, Impaired tone, Decreased coordination, Decreased range of motion, Improper spinal/pelvic alignment (bradykinesia)  Visit Diagnosis: Other symptoms and signs involving the nervous system  Other symptoms and signs involving the musculoskeletal system  Other lack of coordination    Problem List There are no  active problems to display for this patient.   [facility] 12/24/2015, 1:54 PM  [place] [address] [place], [place], [address] Phone: [phone]   Fax:  [phone]  Name: [name] [name] MRN: [id number] Date of Birth: [birth year]  Vianne Bulls, OTR/L [facility] [address] [place], [place]  [address] [phone] phone [phone] 12/24/2015 1:54 PM

## 2015-12-25 ENCOUNTER — Ambulatory Visit: Payer: Medicare Other | Admitting: Physical Therapy

## 2015-12-25 ENCOUNTER — Encounter: Payer: Medicare Other | Admitting: Occupational Therapy

## 2015-12-30 DIAGNOSIS — N400 Enlarged prostate without lower urinary tract symptoms: Secondary | ICD-10-CM | POA: Insufficient documentation

## 2015-12-31 ENCOUNTER — Encounter: Payer: Medicare Other | Admitting: Occupational Therapy

## 2015-12-31 ENCOUNTER — Ambulatory Visit: Payer: Medicare Other | Admitting: Physical Therapy

## 2016-01-02 ENCOUNTER — Ambulatory Visit: Payer: Medicare Other | Admitting: Physical Therapy

## 2016-01-02 ENCOUNTER — Ambulatory Visit: Payer: Medicare Other | Admitting: Occupational Therapy

## 2016-01-02 DIAGNOSIS — R29898 Other symptoms and signs involving the musculoskeletal system: Secondary | ICD-10-CM

## 2016-01-02 DIAGNOSIS — R2689 Other abnormalities of gait and mobility: Secondary | ICD-10-CM | POA: Diagnosis not present

## 2016-01-02 DIAGNOSIS — R293 Abnormal posture: Secondary | ICD-10-CM

## 2016-01-02 DIAGNOSIS — R278 Other lack of coordination: Secondary | ICD-10-CM

## 2016-01-02 DIAGNOSIS — R29818 Other symptoms and signs involving the nervous system: Secondary | ICD-10-CM

## 2016-01-02 NOTE — Therapy (Signed)
[facility] [address] [place], [place], [address] Phone: [phone]   Fax:  [phone]  Occupational Therapy Treatment  Patient Details  Name: [name] [name] MRN: [id number] Date of Birth: [birth year] Referring Provider: Dr. Erie Noe  Encounter Date: 01/02/2016      OT End of Session - 01/02/16 1018    Visit Number 4   Number of Visits 17   Date for OT Re-Evaluation 02/10/16   Authorization Type Medicare, Tricare (g-code)   Authorization Time Period re-cert period 123456   OT Start Time 0935   OT Stop Time 1015   OT Time Calculation (min) 40 min   Activity Tolerance Patient tolerated treatment well   Behavior During Therapy [facility] for tasks assessed/performed      Past Medical History  Diagnosis Date  . Sleep apnea   . REM sleep behavior disorder     Past Surgical History  Procedure Laterality Date  . Appendectomy    . 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   Patient Stated Goals improve writing   Currently in Pain? No/denies                         PWR [facility]) - 01/02/16 1032    PWR! exercises Moves in [place]! Up x20   PWR! Rock x10   PWR! Twist x20   PWR! Step x10   Comments min v.c.   PWR! Up x10   PWR! Rock x10   PWR! Twist x10   PWR! Step x5   Comments min v.c. pt followed with placing grooved pegs in pegboard and removing with PWR! step techniques bilateral UE's   ADL Comments Donning / doffing jacket with adapted techniques usinglarge amplitude movements x 3       Arm bike x 5 mins level 1 for conditioning, pt maintained >40 RPM       OT Education - 01/02/16 1010    Education provided Yes   Education Details PWR! modified quadraped    Person(s) Educated Patient   Methods Explanation;Demonstration;Verbal cues;Handout   Comprehension Verbalized understanding;Returned demonstration          OT Short Term Goals - 12/03/15 1331    OT SHORT TERM GOAL #1   Title Pt will be independent with PD-specific HEP--check STGs 01/02/16   Time 4   Period Weeks   Status New   OT SHORT TERM GOAL #2   Title Pt will improve R shoulder flex to at least 135 with -10* elbow extension for functional reaching (without cues)   Baseline 125* with -20* elbow ext   Time 4   Period Weeks   Status New   OT SHORT TERM GOAL #3   Title Pt will improve coordination for ADLs as shown by improving time on 9-hole peg test by at least 5sec with R hand.   Baseline 40.16sec   Time 4   Period Weeks   Status New   OT SHORT TERM GOAL #4   Title Pt will improve functional reaching/coordination for ADLs as shown by improving score on box and blocks test by at least 5 with RUE.   Baseline 36   Time 4   Period  Weeks   Status New   OT SHORT TERM GOAL #5   Title Pt will demo incr ease with eating as shown by improving time on PPT#2 by at least 4sec.   Baseline 20.16sec   Time 4   Period Weeks   Status New   Additional Short Term Goals   Additional Short Term Goals Yes   OT SHORT TERM GOAL #6   Title Pt will be able to write 1/2 page with no significant decr in size.   Time 4   Period Weeks   Status New           OT Long Term Goals - 12/03/15 1640    OT LONG TERM GOAL #1   Title Pt will verbalize understanding of adaptive strategies to incr ease of ADLs/IADLs prn.   Time 8   Period Weeks   Status New   OT LONG TERM GOAL #2   Title Pt will demo incr ability to dress as shown by improving PPT#4 by at least 4sec   Baseline 21.25sec   Time 8   Period Weeks   Status New   OT LONG TERM GOAL #3   Title Pt will improve coordination for ADLs as shown by improving time on 9-hole peg test by at least 10sec with R hand.   Baseline 40.16   Time 8    Period Weeks   Status New   OT LONG TERM GOAL #4   Title Pt will improve functional reaching/coordination for ADLs as shown by improving score on box and blocks test by at least 10 with RUE.   Baseline 36   Time 8   Period Weeks   Status New   OT LONG TERM GOAL #5   Title Pt will report eating with dominant RUE at least 90% of the time.   Baseline 50%   Time 8   Period Weeks   Status New   Long Term Additional Goals   Additional Long Term Goals Yes   OT LONG TERM GOAL #6   Title Pt will verbalize understanding of ways to prevent future complications and PD-specific community resources prn.   Time 8   Period Weeks   Status New               Plan - 01/02/16 1012    Clinical Impression Statement Pt is progressing towards goals. He responds well to cues for larger amplitude movements with right UE.   Rehab Potential Good   OT Frequency 2x / week   OT Duration 8 weeks   OT Treatment/Interventions Self-care/ADL training;Neuromuscular education;Balance training;Patient/family education;Therapist, nutritional;Therapeutic exercise;Therapeutic exercises;Therapeutic activities;DME and/or AE instruction;Cryotherapy;Electrical Stimulation;Moist Heat;Passive range of motion;Cognitive remediation/compensation;Ultrasound;Energy conservation;Manual Therapy   Plan large amplitude movements with ADLS,    OT Home Exercise Plan Education provided:  Seated PWR! moves (basic 4), PWR! hands (basic 4), initiated coordination HEP with emphasis on large amplitude, PWR1 modified quadraped   Consulted and Agree with Plan of Care Patient      Patient will benefit from skilled therapeutic intervention in order to improve the following deficits and impairments:  Decreased mobility, Impaired UE functional use, Decreased knowledge of use of DME, Decreased balance, Decreased activity tolerance, Impaired tone, Decreased coordination, Decreased range of motion, Improper spinal/pelvic  alignment  Visit Diagnosis: Other symptoms and signs involving the nervous system  Other symptoms and signs involving the musculoskeletal system  Other lack of coordination  Abnormal posture    Problem List There are no active  problems to display for this patient.   Khalel Alms 01/02/2016, 10:35 AM Theone Murdoch, OTR/L [phone] Phone: [phone] 10:35 AM 01/02/2016 [facility] [address] [place], [place], [address] Phone: [phone]   Fax:  [phone]  Name: [name] [name] MRN: [id number] Date of Birth: 03/19/1941

## 2016-01-02 NOTE — Therapy (Signed)
[facility] [address] [place], [place], [address] Phone: [phone]   Fax:  [phone]  Physical Therapy Treatment  Patient Details  Name: [name] [name] MRN: [id number] Date of Birth: 10-11-1940 Referring Provider: Dr. Erie Noe ([place])  Encounter Date: 01/02/2016      PT End of Session - 01/02/16 1416    Visit Number 5   Number of Visits 17   Date for PT Re-Evaluation 02/01/16   Authorization Type Medicare/Tricare 2nd-GCODE every 10th visit; No PTA   PT Start Time [phone]   PT Stop Time 0929   PT Time Calculation (min) 40 min   Activity Tolerance Patient tolerated treatment well   Behavior During Therapy [facility] for tasks assessed/performed      Past Medical History  Diagnosis Date  . Sleep apnea   . REM sleep behavior disorder     Past Surgical History  Procedure Laterality Date  . Appendectomy    . 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      Subjective Assessment - 01/02/16 0851    Subjective Had prostate surgery (outpatient planned surgery>overnight), with pt reporting no restrictions from surgery in regards to therapy or activity level.   Patient Stated Goals Pt's goals for therapy are to get started on regular exercise disease to control PD symptoms.   Currently in Pain? No/denies                         [facility] Adult PT Treatment/Exercise - 01/02/16 0907    Ambulation/Gait   Ambulation/Gait Yes   Ambulation/Gait Assistance 7: Independent   Ambulation Distance (Feet) 400 Feet  then, 800 ft outdoors   Assistive device None   Gait Pattern Decreased arm swing - right;Decreased step length - right;Decreased stance time - right;Decreased dorsiflexion - right;Decreased trunk rotation;Narrow base of support;Poor foot clearance - right  improved with cues   Ambulation Surface Level;Indoor;Outdoor;Paved   Standardized Balance Assessment   Standardized Balance Assessment Timed Up and Go Test   Timed Up and Go Test   TUG Normal TUG;Manual TUG;Cognitive TUG   Normal TUG (seconds) 10.34   Manual TUG (seconds) 12.38   Cognitive TUG (seconds) 10.25   Self-Care   Self-Care Other Self-Care Comments   Other Self-Care Comments  Discussed progress towards goals, plan of care, addressing mobility to work towards LTGs.  Discussed Power over Parkinson's group and discussed transition to community fitness upon eventual d/c from therapy.  Discussed possibility of using foot-up brace on RLE with gait next session.     With gait outdoors with conversation tasks/distractions, pt has episode of decreased R foot clearance.      PWR [facility]) - 01/02/16 1420    PWR! exercises Moves in standing   PWR! Up x 20   PWR! Rock x 20   PWR! Twist x 20   PWR Step x 20  forward, side, back directions   Comments Standing rock and reach with stagger stance with coordinated arm swing x 10 reps each foot position; pt needs occasional cues for technique, but overall appears independent with HEP.             PT Education - 01/02/16 1415    Education provided Yes   Education Details POC, goal progress, parkinson's resources, plans to try foot-up brace for improved R foot clearance   Person(s) Educated Patient   Methods Explanation   Comprehension  Verbalized understanding          PT Short Term Goals - 01/02/16 0906    PT SHORT TERM GOAL #1   Title Pt will be independent with HEP for improved balance, gait, functional mobility.  TARGET 01/02/16   Time 4   Period Weeks   Status Achieved   PT SHORT TERM GOAL #2   Title Pt will improve TUG and TUG cognitive score to <10% difference to demonstrate improved dual tasking with gait activities.   Time 4   Period Weeks   Status Partially Met   PT SHORT TERM GOAL #3   Title Pt will demonstrate improved balance recovery, being able to take <2 steps in posterior and anterior push and release test.    Time 4   Period Weeks   Status Achieved   PT SHORT TERM GOAL #4   Title Pt will verbalize understanding of local Parkinson's disease related resources.   Time 4   Period Weeks   Status Achieved           PT Long Term Goals - 12/03/15 1000    PT LONG TERM GOAL #1   Title Pt will verbalize understanding of fall prevention in the home environment.  TARGET 02/01/16   Time 8   Period Weeks   Status New   PT LONG TERM GOAL #2   Title Pt will improve Functional Gait Assessment to at least 22/30 for improved dynamic balance and gait.   Time 8   Period Weeks   Status New   PT LONG TERM GOAL #3   Title 6 minute walk test to be completed, with pt improving distance in 6 minute walk test by at least 50 ft for improved gait efficiency.   Time 8   Period Weeks   Status New   PT LONG TERM GOAL #4   Title Pt will verbalize understanding of optimal fitness plans upon D/C from PT.   Time 8   Period Weeks   Status New               Plan - 01/02/16 1417    Clinical Impression Statement Pt has missed several appointments due to therapist being out and pt cancelling previous visit this week due to planned prostate surgery.  Goals checked today, with pt meeting 3 of 4 STGs.  Pt feels he is making progress with mobility.  With fatigue and with distractions/dual tasking, pt has increased difficulty with R foot clearance.  Pt may benefit from trial of R foot-up brace for improved awareness and consistency of foot clearance.   Rehab Potential Good   PT Frequency 2x / week   PT Duration 8 weeks   PT Treatment/Interventions ADLs/Self Care Home Management;Therapeutic exercise;Therapeutic activities;Functional mobility training;Gait training;Balance training;Neuromuscular re-education;Patient/family education   PT Next Visit Plan Compliant surface, multi-tasking with gait; trial of foot-up brace on RLE   Consulted and Agree with Plan of Care Patient      Patient will benefit from skilled  therapeutic intervention in order to improve the following deficits and impairments:  Abnormal gait, Decreased balance, Decreased mobility, Difficulty walking, Impaired flexibility, Postural dysfunction  Visit Diagnosis: Other abnormalities of gait and mobility  Abnormal posture     Problem List There are no active problems to display for this patient.   Frazier Butt. 01/02/2016, 2:22 PM  Frazier Butt., PT  [facility] [address] [place], [place], [address] Phone: [phone]   Fax:  [place]  Name: [name] [name] MRN: [id number] Date of Birth: 07/05/1941

## 2016-01-07 ENCOUNTER — Ambulatory Visit: Payer: Medicare Other | Admitting: Occupational Therapy

## 2016-01-07 ENCOUNTER — Ambulatory Visit: Payer: Medicare Other | Attending: Neurology | Admitting: Physical Therapy

## 2016-01-07 DIAGNOSIS — R29818 Other symptoms and signs involving the nervous system: Secondary | ICD-10-CM | POA: Insufficient documentation

## 2016-01-07 DIAGNOSIS — R293 Abnormal posture: Secondary | ICD-10-CM

## 2016-01-07 DIAGNOSIS — R2689 Other abnormalities of gait and mobility: Secondary | ICD-10-CM | POA: Insufficient documentation

## 2016-01-07 DIAGNOSIS — R29898 Other symptoms and signs involving the musculoskeletal system: Secondary | ICD-10-CM | POA: Diagnosis present

## 2016-01-07 DIAGNOSIS — R278 Other lack of coordination: Secondary | ICD-10-CM

## 2016-01-07 DIAGNOSIS — R2681 Unsteadiness on feet: Secondary | ICD-10-CM | POA: Insufficient documentation

## 2016-01-07 NOTE — Therapy (Signed)
[facility] [address] [place], [place], [address] Phone: [phone]   Fax:  [phone]  Occupational Therapy Treatment  Patient Details  Name: [name] [name] MRN: [id number] Date of Birth: 05-07-1941 Referring Provider: Dr. Erie Noe  Encounter Date: 01/07/2016      OT End of Session - 01/07/16 1239    Visit Number 5   Number of Visits 17   Date for OT Re-Evaluation 02/10/16   Authorization Type Medicare, Tricare (g-code)   Authorization Time Period re-cert period 123456   Authorization - Visit Number 4   Authorization - Number of Visits 10   OT Start Time [phone]   OT Stop Time 1017   OT Time Calculation (min) 41 min      Past Medical History  Diagnosis Date  . Sleep apnea   . REM sleep behavior disorder     Past Surgical History  Procedure Laterality Date  . Appendectomy    . 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   Patient Stated Goals improve writing   Currently in Pain? Yes   Pain Score 3    Pain Location Shoulder   Pain Orientation Right   Pain Descriptors / Indicators Aching   Pain Type Acute pain   Pain Onset In the past 7 days   Pain Frequency Intermittent   Aggravating Factors  reaching with RUE   Pain Relieving Factors stretching, repositioning   Multiple Pain Sites No              Dynamic reaching with trunk rotation to place graded clothes pins on vertical antennae with bilateral UE's min v.c. For larger amplitude movements and PWR! Hands Donning / doffing jacket using adapted strategies, with improved performance x 2 reps, fastening buttons, using flicks in between each button Handwriting strategies and handout provided, min v.c, foam grip  issued for increased ease with handwriting           PWR [facility]) - 01/07/16 1241    PWR! exercises Moves in [place]! Up x10   PWR! Rock x10   PWR! Twist x10   PWR! Step x10   Comments min v.c. for larger amplitude movments with RUE             OT Education - 01/07/16 1244    Education provided Yes   Education Details Handwriting strategies, ways to prevent future complications   Person(s) Educated Patient   Methods Explanation;Demonstration;Verbal cues;Handout   Comprehension Verbalized understanding;Returned demonstration          OT Short Term Goals - 12/03/15 1331    OT SHORT TERM GOAL #1   Title Pt will be independent with PD-specific HEP--check STGs  Check 01/09/16   Time 4   Period Weeks   Status New   OT SHORT TERM GOAL #2   Title Pt will improve R shoulder flex to at least 135 with -10* elbow extension for functional reaching (without cues)   Baseline 125* with -20* elbow ext   Time 4   Period Weeks   Status New   OT SHORT TERM GOAL #3   Title Pt will improve coordination for ADLs as shown by improving time on 9-hole peg test by  at least 5sec with R hand.   Baseline 40.16sec   Time 4   Period Weeks   Status New   OT SHORT TERM GOAL #4   Title Pt will improve functional reaching/coordination for ADLs as shown by improving score on box and blocks test by at least 5 with RUE.   Baseline 36   Time 4   Period Weeks   Status New   OT SHORT TERM GOAL #5   Title Pt will demo incr ease with eating as shown by improving time on PPT#2 by at least 4sec.   Baseline 20.16sec   Time 4   Period Weeks   Status New   Additional Short Term Goals   Additional Short Term Goals Yes   OT SHORT TERM GOAL #6   Title Pt will be able to write 1/2 page with no significant decr in size.   Time 4   Period Weeks   Status New           OT Long Term Goals - 12/03/15 1640    OT LONG TERM GOAL #1   Title Pt will verbalize understanding of adaptive strategies  to incr ease of ADLs/IADLs prn.   Time 8   Period Weeks   Status New   OT LONG TERM GOAL #2   Title Pt will demo incr ability to dress as shown by improving PPT#4 by at least 4sec   Baseline 21.25sec   Time 8   Period Weeks   Status New   OT LONG TERM GOAL #3   Title Pt will improve coordination for ADLs as shown by improving time on 9-hole peg test by at least 10sec with R hand.   Baseline 40.16   Time 8   Period Weeks   Status New   OT LONG TERM GOAL #4   Title Pt will improve functional reaching/coordination for ADLs as shown by improving score on box and blocks test by at least 10 with RUE.   Baseline 36   Time 8   Period Weeks   Status New   OT LONG TERM GOAL #5   Title Pt will report eating with dominant RUE at least 90% of the time.   Baseline 50%   Time 8   Period Weeks   Status New   Long Term Additional Goals   Additional Long Term Goals Yes   OT LONG TERM GOAL #6   Title Pt will verbalize understanding of ways to prevent future complications and PD-specific community resources prn.   Time 8   Period Weeks   Status New               Plan - 01/07/16 1238    Clinical Impression Statement Pt is progressing towards goals for large amplitude movements with ADLS. He demonstrates improved performance of donning/ doffing jacket, and fastening buttons.   Rehab Potential Good   OT Frequency 2x / week   OT Duration 8 weeks   OT Treatment/Interventions Self-care/ADL training;Neuromuscular education;Balance training;Patient/family education;Therapist, nutritional;Therapeutic exercise;Therapeutic exercises;Therapeutic activities;DME and/or AE instruction;Cryotherapy;Electrical Stimulation;Moist Heat;Passive range of motion;Cognitive remediation/compensation;Ultrasound;Energy conservation;Manual Therapy   Plan large amplitude movements with ADLS   OT Home Exercise Plan Education provided:  Seated PWR! moves (basic 4), PWR! hands (basic 4), initiated coordination  HEP with emphasis on large amplitude, PWR!modified quadraped, handwriting strategies, donning jacket/buttons, ways to prevent future complications   Consulted and Agree with Plan of Care Patient      Patient will benefit from  skilled therapeutic intervention in order to improve the following deficits and impairments:  Decreased mobility, Impaired UE functional use, Decreased knowledge of use of DME, Decreased balance, Decreased activity tolerance, Impaired tone, Decreased coordination, Decreased range of motion, Improper spinal/pelvic alignment  Visit Diagnosis: Other symptoms and signs involving the nervous system  Other symptoms and signs involving the musculoskeletal system  Other lack of coordination  Abnormal posture    Problem List There are no active problems to display for this patient.   Eliyanna Ault 01/07/2016, 12:46 PM Theone Murdoch, OTR/L [phone] Phone: [phone] 12:46 PM 01/07/2016 [facility] [address] [place], [place], [address] Phone: [phone]   Fax:  [phone]  Name: LEIGHLAND MISFELDT MRN: [id number] Date of Birth: [birth year]

## 2016-01-09 ENCOUNTER — Ambulatory Visit: Payer: Medicare Other | Admitting: Occupational Therapy

## 2016-01-09 ENCOUNTER — Ambulatory Visit: Payer: Medicare Other | Admitting: Rehabilitative and Restorative Service Providers"

## 2016-01-09 DIAGNOSIS — R29818 Other symptoms and signs involving the nervous system: Secondary | ICD-10-CM

## 2016-01-09 DIAGNOSIS — R293 Abnormal posture: Secondary | ICD-10-CM

## 2016-01-09 DIAGNOSIS — R278 Other lack of coordination: Secondary | ICD-10-CM

## 2016-01-09 DIAGNOSIS — R29898 Other symptoms and signs involving the musculoskeletal system: Secondary | ICD-10-CM | POA: Diagnosis not present

## 2016-01-09 DIAGNOSIS — R2689 Other abnormalities of gait and mobility: Secondary | ICD-10-CM

## 2016-01-09 DIAGNOSIS — R2681 Unsteadiness on feet: Secondary | ICD-10-CM

## 2016-01-09 NOTE — Therapy (Signed)
[facility] [address] [place], [place], [address] Phone: [phone]   Fax:  [phone]  Occupational Therapy Treatment  Patient Details  Name: [name] [name] MRN: [id number] Date of Birth: 03/14/1941 Referring Provider: Dr. Erie Noe  Encounter Date: 01/09/2016      OT End of Session - 01/09/16 1158    Visit Number 6   Number of Visits 17   Date for OT Re-Evaluation 02/10/16   Authorization Type Medicare, Tricare (g-code)   Authorization Time Period re-cert period 123456   Authorization - Visit Number 6   Authorization - Number of Visits 10   OT Start Time 1021   OT Stop Time 1105   OT Time Calculation (min) 44 min   Activity Tolerance Patient tolerated treatment well   Behavior During Therapy [facility] for tasks assessed/performed      Past Medical History  Diagnosis Date  . Sleep apnea   . REM sleep behavior disorder     Past Surgical History  Procedure Laterality Date  . Appendectomy    . 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      Subjective Assessment - 01/09/16 1143    Subjective  Pt reports tightness in R shoulder.   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   Patient Stated Goals improve writing   Currently in Pain? Yes   Pain Score 3    Pain Location Shoulder   Pain Descriptors / Indicators Dull;Aching   Pain Frequency Intermittent   Aggravating Factors  with end range shoulder movement   Pain Relieving Factors stretching, repositioning           Neuro re-ed:  PWR! Moves (basic 4) in supine x 10 each with min cues For incr movement amplitude.  Self Care:     Simulated ADLs with bag with focus/min cues for large amplitude movements:  Donning/doffing pull-over shirt, donning/doffing pants, drying back.  Pt instructed in ways to prevent future complications and importance of exercise as medicine for  PD.  Pt verbalized understanding.                       OT Education - 01/09/16 1147    Education Details Large amplitude movements for ADLs; PD exercise flowsheet (recommended alternating exercises, daily PWR! hands) and discussed how to do so   Person(s) Educated Patient   Methods Explanation;Demonstration;Handout   Comprehension Verbalized understanding          OT Short Term Goals - 12/03/15 1331    OT SHORT TERM GOAL #1   Title Pt will be independent with PD-specific HEP--check STGs 01/02/16   Time 4   Period Weeks   Status New   OT SHORT TERM GOAL #2   Title Pt will improve R shoulder flex to at least 135 with -10* elbow extension for functional reaching (without cues)   Baseline 125* with -20* elbow ext   Time 4   Period Weeks   Status New   OT SHORT TERM GOAL #3   Title Pt will improve coordination for ADLs as shown by improving time on 9-hole peg test by at least 5sec with R hand.   Baseline 40.16sec   Time 4   Period Weeks   Status New   OT SHORT TERM GOAL #4   Title Pt will improve functional reaching/coordination for ADLs as shown by improving score on box and  blocks test by at least 5 with RUE.   Baseline 36   Time 4   Period Weeks   Status New   OT SHORT TERM GOAL #5   Title Pt will demo incr ease with eating as shown by improving time on PPT#2 by at least 4sec.   Baseline 20.16sec   Time 4   Period Weeks   Status New   Additional Short Term Goals   Additional Short Term Goals Yes   OT SHORT TERM GOAL #6   Title Pt will be able to write 1/2 page with no significant decr in size.   Time 4   Period Weeks   Status New           OT Long Term Goals - 12/03/15 1640    OT LONG TERM GOAL #1   Title Pt will verbalize understanding of adaptive strategies to incr ease of ADLs/IADLs prn.   Time 8   Period Weeks   Status New   OT LONG TERM GOAL #2   Title Pt will demo incr ability to dress as shown by improving PPT#4 by at least 4sec    Baseline 21.25sec   Time 8   Period Weeks   Status New   OT LONG TERM GOAL #3   Title Pt will improve coordination for ADLs as shown by improving time on 9-hole peg test by at least 10sec with R hand.   Baseline 40.16   Time 8   Period Weeks   Status New   OT LONG TERM GOAL #4   Title Pt will improve functional reaching/coordination for ADLs as shown by improving score on box and blocks test by at least 10 with RUE.   Baseline 36   Time 8   Period Weeks   Status New   OT LONG TERM GOAL #5   Title Pt will report eating with dominant RUE at least 90% of the time.   Baseline 50%   Time 8   Period Weeks   Status New   Long Term Additional Goals   Additional Long Term Goals Yes   OT LONG TERM GOAL #6   Title Pt will verbalize understanding of ways to prevent future complications and PD-specific community resources prn.   Time 8   Period Weeks   Status New               Plan - 01/09/16 1022    Clinical Impression Statement Pt is progressing towards goals with improved movement amplitude for ADLs.  Pt demo good response to cueing for large amplitude movements.    Rehab Potential Good   OT Frequency 2x / week   OT Duration 8 weeks   OT Treatment/Interventions Self-care/ADL training;Neuromuscular education;Balance training;Patient/family education;Therapist, nutritional;Therapeutic exercise;Therapeutic exercises;Therapeutic activities;DME and/or AE instruction;Cryotherapy;Electrical Stimulation;Moist Heat;Passive range of motion;Cognitive remediation/compensation;Ultrasound;Energy conservation;Manual Therapy   Plan check STGs (extended due to decr frequency with pt cancels) PWR! supine (?issue for home); coordination with large amplitude movemens   OT Home Exercise Plan Education provided:  Seated PWR! moves (basic 4), PWR! hands (basic 4), initiated coordination HEP with emphasis on large amplitude, PWR!modified quadraped, handwriting strategies, donning jacket/buttons,  ways to prevent future complications   Consulted and Agree with Plan of Care Patient      Patient will benefit from skilled therapeutic intervention in order to improve the following deficits and impairments:  Decreased mobility, Impaired UE functional use, Decreased knowledge of use of DME, Decreased balance, Decreased activity tolerance, Impaired  tone, Decreased coordination, Decreased range of motion, Improper spinal/pelvic alignment  Visit Diagnosis: Other symptoms and signs involving the nervous system  Other symptoms and signs involving the musculoskeletal system  Other lack of coordination  Abnormal posture  Unsteadiness on feet    Problem List There are no active problems to display for this patient.   [facility] 01/09/2016, 12:43 PM  [facility] [address] [place], [place], [address] Phone: [phone]   Fax:  [phone]  Name: [name] [name] MRN: [id number] Date of Birth: [birth year]  Vianne Bulls, OTR/L [facility] [address] [place], [place]  [address] [phone] phone [phone] 01/09/2016 12:44 PM

## 2016-01-09 NOTE — Patient Instructions (Addendum)
(  Exercise) Monday Tuesday Wednesday Thursday Friday Saturday Sunday  PWR hands (every day)         coordination           PWR  sitting           PWR Hands and knees          PWR standing                                                                                                Performing Daily Activities with Big Movements  Pick at least one activity a day and perform with BIG, DELIBERATE movements/effort. This can make the activity easier and turn daily activities into exercise!  If you are standing during the activity, make sure to keep feet apart and stand with good/big/PWR! UP posture.  Examples:  Dressing - Push arms in sleeves, twist when putting on jacket, push foot into pants, open hands to pull down shirt/put on socks/pull up pants  Bathing - Wash/dry with long strokes  Brushing your teeth - Big, slow movements  Cutting food - Long deliberate cuts  Opening jar/bottle - Move as much as you can with each turn  Picking up a cup/bottle - Open hand up big and get object all the way in palm  Hanging up clothes/getting clothes down from closet - Reach with big effort  Putting away groceries/dishes - Reach with big effort  Wiping counter/table - Move in big, long strokes  Stirring while cooking - Exaggerate movement  Cleaning windows - Move in big, long strokes  Sweeping - Move arms in big, long strokes  Vacuuming - Push with big movement  Folding clothes - Exaggerate arm movements  Washing car - Move in big, long strokes  Raking - Move arms in big, long strokes  Changing light bulb - Move as much as you can with each turn  Using a screwdriver - Move as much as you can with each turn  Walking into a store/restaurant - Walk with big steps, swing arms if able  Standing up from a chair/recliner/sofa - Scoot forward, lean forward, and stand with big effort

## 2016-01-09 NOTE — Therapy (Signed)
[facility] [address] [place], [place], [address] Phone: [phone]   Fax:  [phone]  Physical Therapy Treatment  Patient Details  Name: [name] [name] MRN: [id number] Date of Birth: 1940-12-01 Referring Provider: Dr. Erie Noe ([place])  Encounter Date: 01/09/2016      PT End of Session - 01/09/16 1214    Visit Number 6   Number of Visits 17   Date for PT Re-Evaluation 02/01/16   Authorization Type Medicare/Tricare 2nd-GCODE every 10th visit; No PTA   PT Start Time 0932   PT Stop Time 1015   PT Time Calculation (min) 43 min   Activity Tolerance Patient tolerated treatment well   Behavior During Therapy [facility] for tasks assessed/performed      Past Medical History  Diagnosis Date  . Sleep apnea   . REM sleep behavior disorder     Past Surgical History  Procedure Laterality Date  . Appendectomy    . 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      Subjective Assessment - 01/09/16 0936    Subjective The patient reports he is having difficulty managing all HEP with medical appts for himself and his wife.  PT and patient discussed dividing exercises into 2 groups and alternating days to make regular exercise more consistent.    Patient Stated Goals Pt's goals for therapy are to get started on regular exercise program to control PD symptoms.   Currently in Pain? Yes   Pain Score 3    Pain Location Shoulder  *OT addressing                         [facility] Adult PT Treatment/Exercise - 01/09/16 0950    Ambulation/Gait   Ambulation/Gait Yes   Ambulation/Gait Assistance 7: Independent   Ambulation Distance (Feet) 1000 Feet   Assistive device None   Gait Comments Tried walk on and foot up brace with patient reporting that braces feel that they hinder movement.  Walked outdoors on level and unleve surfaces without a device without loss of balance.  Added ball  toss and other multi-tasking without difficulty.   Performed heel/toe walking, direction changes, braiding, sidestepping, marching, without loss of balance.   Self-Care   Self-Care Other Self-Care Comments   Other Self-Care Comments  PT and patient discussed options with brace and when bracing indicated.  At  this time, the patient does not occasional toe drag, but it able to easily self recover.  He is walking for exercise daily.  The patient discussed difficulty with performing HEP PWR exercises and PT discussed possibility of alternating days to diminish time requirements due to patient + wife having medical appts.     Exercises   Exercises Other Exercises   Other Exercises  Seated toe/foot exercises including tennis ball roll, moving toes into flex/extension over tennis ball, ankle eversion, alternating inversion/eversion.  Performed due to patient report of difficulty slipping into shoes with right foot.                  PT Short Term Goals - 01/02/16 0906    PT SHORT TERM GOAL #1   Title Pt will be independent with HEP for improved balance, gait, functional mobility.  TARGET 01/02/16   Time 4   Period Weeks   Status Achieved   PT SHORT TERM GOAL #2   Title Pt will improve TUG and TUG cognitive  score to <10% difference to demonstrate improved dual tasking with gait activities.   Time 4   Period Weeks   Status Partially Met   PT SHORT TERM GOAL #3   Title Pt will demonstrate improved balance recovery, being able to take <2 steps in posterior and anterior push and release test.   Time 4   Period Weeks   Status Achieved   PT SHORT TERM GOAL #4   Title Pt will verbalize understanding of local Parkinson's disease related resources.   Time 4   Period Weeks   Status Achieved           PT Long Term Goals - 12/03/15 1000    PT LONG TERM GOAL #1   Title Pt will verbalize understanding of fall prevention in the home environment.  TARGET 02/01/16   Time 8   Period Weeks    Status New   PT LONG TERM GOAL #2   Title Pt will improve Functional Gait Assessment to at least 22/30 for improved dynamic balance and gait.   Time 8   Period Weeks   Status New   PT LONG TERM GOAL #3   Title 6 minute walk test to be completed, with pt improving distance in 6 minute walk test by at least 50 ft for improved gait efficiency.   Time 8   Period Weeks   Status New   PT LONG TERM GOAL #4   Title Pt will verbalize understanding of optimal fitness plans upon D/C from PT.   Time 8   Period Weeks   Status New               Plan - 01/09/16 1214    Clinical Impression Statement The patient did not feel that foot up or off the shelf AFO improved walking.  Per observation, it appeared to slow timining/initiation of R hip swing phase of gait.  PT to continue working towards [place].  Patient feels largest current deficits are in UE/hand being addressed by OT.    PT Treatment/Interventions ADLs/Self Care Home Management;Therapeutic exercise;Therapeutic activities;Functional mobility training;Gait training;Balance training;Neuromuscular re-education;Patient/family education   PT Next Visit Plan Compliant surfaces, multi-tasking, establishing consistent HEP, consider early d/c if continuing to progress well to LTGs.   Consulted and Agree with Plan of Care Patient      Patient will benefit from skilled therapeutic intervention in order to improve the following deficits and impairments:  Abnormal gait, Decreased balance, Decreased mobility, Difficulty walking, Impaired flexibility, Postural dysfunction  Visit Diagnosis: Other symptoms and signs involving the nervous system  Other abnormalities of gait and mobility  Unsteadiness on feet     Problem List There are no active problems to display for this patient.   [place], [place] 01/09/2016, 12:26 PM  [facility] [address] [place], [place],  [address] Phone: [phone]   Fax:  [phone]  Name: BARNES FLOREK MRN: [id number] Date of Birth: Oct 25, 1940

## 2016-01-14 ENCOUNTER — Ambulatory Visit: Payer: Medicare Other | Admitting: Physical Therapy

## 2016-01-14 ENCOUNTER — Ambulatory Visit: Payer: Medicare Other | Admitting: Occupational Therapy

## 2016-01-14 DIAGNOSIS — R2681 Unsteadiness on feet: Secondary | ICD-10-CM

## 2016-01-14 DIAGNOSIS — R29818 Other symptoms and signs involving the nervous system: Secondary | ICD-10-CM

## 2016-01-14 DIAGNOSIS — R293 Abnormal posture: Secondary | ICD-10-CM

## 2016-01-14 DIAGNOSIS — R29898 Other symptoms and signs involving the musculoskeletal system: Secondary | ICD-10-CM | POA: Diagnosis not present

## 2016-01-14 DIAGNOSIS — R278 Other lack of coordination: Secondary | ICD-10-CM

## 2016-01-14 DIAGNOSIS — R2689 Other abnormalities of gait and mobility: Secondary | ICD-10-CM

## 2016-01-14 NOTE — Therapy (Signed)
[facility] [address] [place], [place], [address] Phone: [phone]   Fax:  [phone]  Occupational Therapy Treatment  Patient Details  Name: [name] [name] MRN: [id number] Date of Birth: 1941/04/10 Referring Provider: Dr. Erie Noe  Encounter Date: 01/14/2016      OT End of Session - 01/14/16 0948    Visit Number 7   Number of Visits 17   Date for OT Re-Evaluation 02/10/16   Authorization Type Medicare, Tricare (g-code)   Authorization Time Period re-cert period 01/06/75-09/12/05   Authorization - Visit Number 7   Authorization - Number of Visits 10   OT Start Time [phone]   OT Stop Time 1019   OT Time Calculation (min) 45 min   Activity Tolerance Patient tolerated treatment well   Behavior During Therapy [facility] for tasks assessed/performed      Past Medical History  Diagnosis Date  . Sleep apnea   . REM sleep behavior disorder     Past Surgical History  Procedure Laterality Date  . Appendectomy    . 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      Subjective Assessment - 01/14/16 1232    Subjective  Pt reports improved coordination and writing   Pertinent History kidney donation (9/16), REM sleep disorder, sleep apnea.   Patient Stated Goals improve writing   Currently in Pain? No/denies          Self Care:    Writing:  Practiced copying 5 sentences with good legibility and size.  Very minor decr in size comparing 1st sentence with last, but no decline within words/sentences.   Pt utilized [employer]! Hands prn without cueing.  Checked STGs (see below) and discussed progress.  Reviewed use of HEP flowsheet.  Pt reports that this has helped with good success and that he thinks that he will be able to do all exercises everyday eventually.   Reviewed ways to prevent future complications from PD, potential/typical cognitive changes with PD and ways to challenge  cognition.  Pt verbalized understanding.                    OT Education - 01/14/16 1011    Education Details PWR! (basic 4) in supine;  Keeping thinking Skills [employer]) Educated Patient   Methods Explanation;Handout;Verbal cues;Demonstration   Comprehension Verbalized understanding;Returned demonstration;Verbal cues required  min v.c. intermittently for large amplitude movements          OT Short Term Goals - 01/14/16 0940    OT SHORT TERM GOAL #1   Title Pt will be independent with PD-specific HEP--check STGs 01/02/16   Time 4   Period Weeks   Status On-going  met with current HEP, but would benefit from updates   OT SHORT TERM GOAL #2   Title Pt will improve R shoulder flex to at least 135 with -10* elbow extension for functional reaching (without cues)   Baseline 125* with -20* elbow ext   Time 4   Period Weeks   Status Achieved  01/14/16:  met 135* with -10* elbow ext   OT SHORT TERM GOAL #3   Title Pt will improve coordination for ADLs as shown by improving time on 9-hole peg test by at least 5sec with R hand.   Baseline 40.16sec   Time 4   Period Weeks   Status Achieved  01/14/16:  32.15sec   OT SHORT TERM GOAL #  4   Title Pt will improve functional reaching/coordination for ADLs as shown by improving score on box and blocks test by at least 5 with RUE.   Baseline 36   Time 4   Period Weeks   Status Achieved  01/14/16:  46 blocks   OT SHORT TERM GOAL #5   Title Pt will demo incr ease with eating as shown by improving time on PPT#2 by at least 4sec.   Baseline 20.16sec   Time 4   Period Weeks   Status Achieved  01/14/15: 13.28sec   OT SHORT TERM GOAL #6   Title Pt will be able to write 1/2 page with no significant decr in size.   Time 4   Period Weeks   Status Achieved  01/14/16 met with incr time, good legibility and size           OT Long Term Goals - 12/03/15 1640    OT LONG TERM GOAL #1   Title Pt will verbalize understanding of  adaptive strategies to incr ease of ADLs/IADLs prn.   Time 8   Period Weeks   Status New   OT LONG TERM GOAL #2   Title Pt will demo incr ability to dress as shown by improving PPT#4 by at least 4sec   Baseline 21.25sec   Time 8   Period Weeks   Status New   OT LONG TERM GOAL #3   Title Pt will improve coordination for ADLs as shown by improving time on 9-hole peg test by at least 10sec with R hand.   Baseline 40.16   Time 8   Period Weeks   Status New   OT LONG TERM GOAL #4   Title Pt will improve functional reaching/coordination for ADLs as shown by improving score on box and blocks test by at least 10 with RUE.   Baseline 36   Time 8   Period Weeks   Status New   OT LONG TERM GOAL #5   Title Pt will report eating with dominant RUE at least 90% of the time.   Baseline 50%   Time 8   Period Weeks   Status New   Long Term Additional Goals   Additional Long Term Goals Yes   OT LONG TERM GOAL #6   Title Pt will verbalize understanding of ways to prevent future complications and PD-specific community resources prn.   Time 8   Period Weeks   Status New               Plan - 01/14/16 1014    Clinical Impression Statement Pt demo excellent progress towards goals with 5/6 STGs met (HEP ongoing).     Rehab Potential Good   OT Frequency 2x / week   OT Duration 8 weeks   OT Treatment/Interventions Self-care/ADL training;Neuromuscular education;Balance training;Patient/family education;Therapist, nutritional;Therapeutic exercise;Therapeutic exercises;Therapeutic activities;DME and/or AE instruction;Cryotherapy;Electrical Stimulation;Moist Heat;Passive range of motion;Cognitive remediation/compensation;Ultrasound;Energy conservation;Manual Therapy   Plan PWR! prone; coordination; large amplitude movements with dual task   OT Home Exercise Plan Education provided:  Seated PWR! moves (basic 4), PWR! hands (basic 4), initiated coordination HEP with emphasis on large  amplitude, PWR!modified quadraped, handwriting strategies, donning jacket/buttons, ways to prevent future complications; large amplitude movements for ADLs; 01/14/16 PWR! supine (basic 4)   Consulted and Agree with Plan of Care Patient      Patient will benefit from skilled therapeutic intervention in order to improve the following deficits and impairments:  Decreased mobility, Impaired UE  functional use, Decreased knowledge of use of DME, Decreased balance, Decreased activity tolerance, Impaired tone, Decreased coordination, Decreased range of motion, Improper spinal/pelvic alignment  Visit Diagnosis: Other symptoms and signs involving the nervous system  Other symptoms and signs involving the musculoskeletal system  Other lack of coordination  Abnormal posture  Unsteadiness on feet    Problem List There are no active problems to display for this patient.   [name] 01/14/2016, 12:37 PM  [facility] [place] [address] [place], [place], [address] Phone: [phone]   Fax:  [phone]  Name: [name] [name] MRN: [id number] Date of Birth: 12/12/1940  Vianne Bulls, OTR/L [facility] [address] [place], [place]  [address] [phone] phone [phone] 01/14/2016 12:37 PM

## 2016-01-14 NOTE — Patient Instructions (Signed)
Toe / Heel Raise    Standing at counter:  Gently rock back on heels and raise toes.  Hold for 3 seconds. Then rock forward on toes and raise heels.  Hold 3 seconds (make sure that your full foot, including big toe, are in contact with the ground.) Repeat sequence 2 sets of __15__ times per session. Do __3__ sessions per week.  Copyright  VHI. All rights reserved.  Walking on Heels    Walk on heels for _10-15__ feet while continuing on a straight path. You can hold to a counter for support, 3-5 lengths of the counter Do _1__ sessions per day.  Copyright  VHI. All rights reserved.  ANKLE: Inversion, Unilateral    Sit at edge of surface, feet on floor. Raise toes of foot up and move toward body. Do not move hip. 2 sets of _15__ reps , _3__ days per week  Copyright  VHI. All rights reserved.  ANKLE: Eversion, Unilateral    Sit at edge of surface, feet on floor. Raise toes of foot up and move away from body. Do not move hip or knee. _2 sets of 15__ reps , _3__ days per week  Copyright  VHI. All rights reserved.

## 2016-01-14 NOTE — Patient Instructions (Signed)
Keeping Thinking Skills [place]: 1. Jigsaw puzzles 2. Card/board games 3. Talking on the phone/social events 4. Lum[employer]com 5. Online games 6. Word serches/crossword puzzles 7.  Logic puzzles 8. Aerobic exercise (stationary bike) 9. Eating balanced diet (fruits & veggies) 10. Drink water 11. Try something new--new recipe, hobby 12. Crafts 13. Do a variety of activities that are challenging 14. Add cognitive activities to walking/exercising (think of animal/food/city with each letter of the alphabet, counting backwards, thinking of as many vegetables as you can, etc.).--Only do this  If safe (no freezing/falls).  

## 2016-01-15 NOTE — Therapy (Signed)
[facility] [address] [place], [place], [address] Phone: [phone]   Fax:  [phone]  Physical Therapy Treatment  Patient Details  Name: GURVIR SCHROM MRN: [id number] Date of Birth: 11-23-1940 Referring Provider: Dr. Erie Noe ([facility] [facility])  Encounter Date: 01/14/2016      PT End of Session - 01/15/16 0942    Visit Number 7   Number of Visits 17   Date for PT Re-Evaluation 02/01/16   Authorization Type Medicare/Tricare 2nd-GCODE every 10th visit; No PTA   PT Start Time 0933   PT Stop Time 1017   PT Time Calculation (min) 44 min   Activity Tolerance Patient tolerated treatment well   Behavior During Therapy WFL for tasks assessed/performed      Past Medical History  Diagnosis Date  . Sleep apnea   . REM sleep behavior disorder     Past Surgical History  Procedure Laterality Date  . Appendectomy    . 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      Subjective Assessment - 01/14/16 1022    Subjective Utilized the exercise chart to alternate days for performing exercise-the chart really helped.     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   Currently in Pain? No/denies                         [facility] Adult PT Treatment/Exercise - 01/14/16 1026    Ambulation/Gait   Ambulation/Gait Yes   Ambulation/Gait Assistance 7: Independent   Ambulation Distance (Feet) 1310 Feet  in 6 minute walk (1425 ft total)   Assistive device None   Gait Pattern Decreased arm swing - right;Decreased step length - right;Decreased stance time - right;Decreased dorsiflexion - right;Decreased trunk rotation;Narrow base of support;Poor foot clearance - right   Ambulation Surface Level;Indoor   Gait  Comments Dynamic gait activities to incorporate improved dorsiflexion and foot clearance:  heel walking, 12 ft x 3 reps then squat heel walking x 3 reps of 12 ft.   Exercises   Exercises Other Exercises  Heel/toe raises in standing x 10 reps 2 sets   Other Exercises  Seated resisted ankle dorsiflexion with green band 2 sets x 10 reps, then seated resisted ankle eversion with green band x 10 reps with cues to avoid compensation with hip.  Pt has difficulty with resisted eversion, so A/ROM eversion and inversion on RLE performed, using pillow case as visual mechanism for exercise, 3 sets each.     See instructions for HEP additions-     Self Care:      PT Education - 01/15/16 0940    Education provided Yes   Education Details Review of exercise chart for prioriting exercise; discussion/review of foot-up brace (pt prefers not to use at this time) and strategies for strengthening LLE at ankle for improved foot clearance   Person(s) Educated Patient   Methods Explanation;Demonstration;Handout;Verbal cues   Comprehension Verbalized understanding;Returned demonstration;Verbal cues required          PT Short Term Goals - 01/02/16 0906    PT SHORT TERM GOAL #1   Title Pt will be independent with HEP for improved balance, gait, functional mobility.  TARGET 01/02/16   Time 4  Period Weeks   Status Achieved   PT SHORT TERM GOAL #2   Title Pt will improve TUG and TUG cognitive score to <10% difference to demonstrate improved dual tasking with gait activities.   Time 4   Period Weeks   Status Partially Met   PT SHORT TERM GOAL #3   Title Pt will demonstrate improved balance recovery, being able to take <2 steps in posterior and anterior push and release test.   Time 4   Period Weeks   Status Achieved   PT SHORT TERM GOAL #4   Title Pt will verbalize understanding of local Parkinson's disease related resources.   Time 4   Period Weeks   Status Achieved           PT Long Term  Goals - 12/03/15 1000    PT LONG TERM GOAL #1   Title Pt will verbalize understanding of fall prevention in the home environment.  TARGET 02/01/16   Time 8   Period Weeks   Status New   PT LONG TERM GOAL #2   Title Pt will improve Functional Gait Assessment to at least 22/30 for improved dynamic balance and gait.   Time 8   Period Weeks   Status New   PT LONG TERM GOAL #3   Title 6 minute walk test to be completed, with pt improving distance in 6 minute walk test by at least 50 ft for improved gait efficiency.   Time 8   Period Weeks   Status New   PT LONG TERM GOAL #4   Title Pt will verbalize understanding of optimal fitness plans upon D/C from PT.   Time 8   Period Weeks   Status New               Plan - 01/15/16 0945    Clinical Impression Statement Treatment session focused today on strengthening exercises for R ankle and lower leg, with additions made to HEP.  Pt will continue to benefit from further skilled PT to address strength, timing and coordination of gait and balance.   PT Treatment/Interventions ADLs/Self Care Home Management;Therapeutic exercise;Therapeutic activities;Functional mobility training;Gait training;Balance training;Neuromuscular re-education;Patient/family education   PT Next Visit Plan REview HEP for ankle strengthening, work on multi-tasking/compliant surface, timing/coordination of gait, consider early d/c if continuing to progress well to LTGs.   Consulted and Agree with Plan of Care Patient      Patient will benefit from skilled therapeutic intervention in order to improve the following deficits and impairments:  Abnormal gait, Decreased balance, Decreased mobility, Difficulty walking, Impaired flexibility, Postural dysfunction  Visit Diagnosis: Other abnormalities of gait and mobility  Unsteadiness on feet  Abnormal posture     Problem List There are no active problems to display for this patient.   Ebony Yorio W. 01/15/2016,  9:49 AM  Frazier Butt., PT  [facility] [address] [place], [place], [address] Phone: [phone]   Fax:  [phone]  Name: BREYLEN AGYEMAN MRN: [id number] Date of Birth: [birth year]

## 2016-01-16 ENCOUNTER — Ambulatory Visit: Payer: Medicare Other | Admitting: Physical Therapy

## 2016-01-16 ENCOUNTER — Ambulatory Visit: Payer: Medicare Other | Admitting: Occupational Therapy

## 2016-01-16 DIAGNOSIS — R2689 Other abnormalities of gait and mobility: Secondary | ICD-10-CM

## 2016-01-16 DIAGNOSIS — R29898 Other symptoms and signs involving the musculoskeletal system: Secondary | ICD-10-CM

## 2016-01-16 DIAGNOSIS — R278 Other lack of coordination: Secondary | ICD-10-CM

## 2016-01-16 DIAGNOSIS — R29818 Other symptoms and signs involving the nervous system: Secondary | ICD-10-CM

## 2016-01-16 NOTE — Therapy (Signed)
[facility] [address] [place], [place], [address] Phone: [phone]   Fax:  [phone]  Occupational Therapy Treatment  Patient Details  Name: [name] [name] MRN: [id number] Date of Birth: Apr 18, 1941 Referring Provider: Dr. Erie Noe  Encounter Date: 01/16/2016      OT End of Session - 01/16/16 0952    Visit Number 8   Number of Visits 17   Date for OT Re-Evaluation 02/10/16   Authorization Type Medicare, Tricare (g-code)   Authorization Time Period re-cert period 03/13/15-0/7/37   Authorization - Visit Number 8   Authorization - Number of Visits 10   OT Start Time [phone]   OT Stop Time 1017   OT Time Calculation (min) 40 min   Activity Tolerance Patient tolerated treatment well      Past Medical History  Diagnosis Date  . Sleep apnea   . REM sleep behavior disorder     Past Surgical History  Procedure Laterality Date  . Appendectomy    . 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      Subjective Assessment - 01/16/16 1011    Pertinent History kidney donation (9/16), REM sleep disorder, sleep apnea.   Patient Stated Goals improve writing   Currently in Pain? No/denies        Ambulating while tossing ball between hands and performing category generation for dual-tasksing min difficulty/ drops Copying small peg design on vertical surface with RUE, incorporating trunk rotation and elbow extension, min v.c. For correct design/ techniques.   Armbike x 5 mins at for conditioning, pt maintained 40RPM                  PWR [facility]) - 01/16/16 1012    PWR! exercises Moves in prone   PWR! Up x20   PWR! Rock [employer]! Twist x20   PWR! Step x10   Comments min v.c./ demonstration               OT Short Term Goals - 01/14/16 0940    OT SHORT TERM GOAL #1   Title Pt will be independent with PD-specific HEP--check STGs 01/02/16   Time 4   Period  Weeks   Status On-going  met with current HEP, but would benefit from updates   OT SHORT TERM GOAL #2   Title Pt will improve R shoulder flex to at least 135 with -10* elbow extension for functional reaching (without cues)   Baseline 125* with -20* elbow ext   Time 4   Period Weeks   Status Achieved  01/14/16:  met 135* with -10* elbow ext   OT SHORT TERM GOAL #3   Title Pt will improve coordination for ADLs as shown by improving time on 9-hole peg test by at least 5sec with R hand.   Baseline 40.16sec   Time 4   Period Weeks   Status Achieved  01/14/16:  32.15sec   OT SHORT TERM GOAL #4   Title Pt will improve functional reaching/coordination for ADLs as shown by improving score on box and blocks test by at least 5 with RUE.   Baseline 36   Time 4   Period Weeks   Status Achieved  01/14/16:  46 blocks   OT SHORT TERM GOAL #5   Title Pt will demo incr ease with eating as shown by improving time on PPT#2 by at least 4sec.   Baseline 20.16sec  Time 4   Period Weeks   Status Achieved  01/14/15: 13.28sec   OT SHORT TERM GOAL #6   Title Pt will be able to write 1/2 page with no significant decr in size.   Time 4   Period Weeks   Status Achieved  01/14/16 met with incr time, good legibility and size           OT Long Term Goals - 12/03/15 1640    OT LONG TERM GOAL #1   Title Pt will verbalize understanding of adaptive strategies to incr ease of ADLs/IADLs prn.   Time 8   Period Weeks   Status New   OT LONG TERM GOAL #2   Title Pt will demo incr ability to dress as shown by improving PPT#4 by at least 4sec   Baseline 21.25sec   Time 8   Period Weeks   Status New   OT LONG TERM GOAL #3   Title Pt will improve coordination for ADLs as shown by improving time on 9-hole peg test by at least 10sec with R hand.   Baseline 40.16   Time 8   Period Weeks   Status New   OT LONG TERM GOAL #4   Title Pt will improve functional reaching/coordination for ADLs as shown by improving  score on box and blocks test by at least 10 with RUE.   Baseline 36   Time 8   Period Weeks   Status New   OT LONG TERM GOAL #5   Title Pt will report eating with dominant RUE at least 90% of the time.   Baseline 50%   Time 8   Period Weeks   Status New   Long Term Additional Goals   Additional Long Term Goals Yes   OT LONG TERM GOAL #6   Title Pt will verbalize understanding of ways to prevent future complications and PD-specific community resources prn.   Time 8   Period Weeks   Status New               Plan - 01/16/16 0942    Clinical Impression Statement Pt is progressing towards goals for PD specific HEP      Patient will benefit from skilled therapeutic intervention in order to improve the following deficits and impairments:     Visit Diagnosis: Other symptoms and signs involving the nervous system  Other symptoms and signs involving the musculoskeletal system  Other lack of coordination    Problem List There are no active problems to display for this patient.   RINE,KATHRYN 01/16/2016, 10:13 AM  [facility] [address] [place], [place], [address] Phone: [phone]   Fax:  [phone]  Name: [name] [name] MRN: [id number] Date of Birth: 1941-06-01

## 2016-01-16 NOTE — Patient Instructions (Signed)
ANKLE: Dorsiflexion (Band)    Sit at edge of surface. Place band around top of foot. Keeping heel on floor, raise toes of banded foot. Hold __3_ seconds. Use ____green____ band. _10__ reps per set, __2_ sets per day  Copyright  VHI. All rights reserved.

## 2016-01-16 NOTE — Therapy (Signed)
[facility] [address] [place], [place], [address] Phone: [phone]   Fax:  [phone]  Physical Therapy Treatment  Patient Details  Name: [name] [name] MRN: [id number] Date of Birth: [birth year] Referring Provider: Dr. Erie Noe ([place])  Encounter Date: 01/16/2016      PT End of Session - 01/16/16 1414    Visit Number 8   Number of Visits 17   Date for PT Re-Evaluation 02/01/16   Authorization Type Medicare/Tricare 2nd-GCODE every 10th visit; No PTA   PT Start Time [phone]   PT Stop Time 0930   PT Time Calculation (min) 41 min   Activity Tolerance Patient tolerated treatment well   Behavior During Therapy [facility] for tasks assessed/performed      Past Medical History  Diagnosis Date  . Sleep apnea   . REM sleep behavior disorder     Past Surgical History  Procedure Laterality Date  . Appendectomy    . 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      Subjective Assessment - 01/16/16 0850    Subjective Went to [place] yesterday to take my granddaughter some furniture-got some exercise doing that.   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   Currently in Pain? No/denies            Therapeutic Exercise: -Review of ankle strengthening exercises as part of HEP given last visit-pt requires verbal cues for technique of ankle inversion and eversion to avoid hip compensation. -Seated ankle dorsflexion resisted with green band 2 sets x 10 reps-added to HEP and provided patient with green theraband.  -Gait:  The following pre-gait activities performed with increased attention to awareness of timing and coordination with R hip flexion/swing through phase with gait. -Standing forward/back step  and weigthshift x 15 reps  -Standing stagger stance forward/back weightshifting with coordinated arm swing x 15 reps each -Standing step taps to 12" step, followed by step back and posterior weightshift x 10 reps, then step tap to 18" step, followed by step back and posterior weightshift x 10 reps each with UE support.  Gait activities in gym, indoor, level surface area, 400 ft, then 200 ft, then outdoor gait (sidewalk and paved surfaces, slight inclines/declines), no assistive device, x 800 ft, independently, with cues for improved awareness of timing and coordination with R hip flexion/swing through phase of gait.  Pt noted to have overall improved gait pattern, with improved trunk rotation, decreased incidence of foot slap on RLE.  Towards end of outdoor gait, pt noticeably fatigued with several episodes of decreased R foot clearance.                      PT Education - 01/16/16 1414    Education provided Yes   Education Details HEP addition-resisted seated ankle dorsiflexion   Person(s) Educated Patient   Methods Explanation;Demonstration;Handout   Comprehension Verbalized understanding;Returned demonstration          PT Short Term Goals - 01/02/16 0906    PT SHORT TERM GOAL #1   Title Pt will be independent with HEP for improved balance, gait, functional mobility.  TARGET 01/02/16   Time 4   Period Weeks   Status Achieved   PT SHORT TERM GOAL #  2   Title Pt will improve TUG and TUG cognitive score to <10% difference to demonstrate improved dual tasking with gait activities.   Time 4   Period Weeks   Status Partially Met   PT SHORT TERM GOAL #3   Title Pt will demonstrate improved balance recovery, being able to take <2 steps in posterior and anterior push and release test.   Time 4   Period Weeks   Status Achieved   PT SHORT TERM GOAL #4   Title Pt will verbalize understanding of local Parkinson's disease related resources.   Time 4   Period Weeks   Status  Achieved           PT Long Term Goals - 12/03/15 1000    PT LONG TERM GOAL #1   Title Pt will verbalize understanding of fall prevention in the home environment.  TARGET 02/01/16   Time 8   Period Weeks   Status New   PT LONG TERM GOAL #2   Title Pt will improve Functional Gait Assessment to at least 22/30 for improved dynamic balance and gait.   Time 8   Period Weeks   Status New   PT LONG TERM GOAL #3   Title 6 minute walk test to be completed, with pt improving distance in 6 minute walk test by at least 50 ft for improved gait efficiency.   Time 8   Period Weeks   Status New   PT LONG TERM GOAL #4   Title Pt will verbalize understanding of optimal fitness plans upon D/C from PT.   Time 8   Period Weeks   Status New               Plan - 01/16/16 1416    Clinical Impression Statement Treatment session focused on review of ankle strengthening exercises as well as pre-gait and gait activities focusing on initiation of hip flexion and improved swing phase on RLE during gait.  With improved focus and awareness on this, pt is able to sustain more nomral walking pattern for longer periods of time without R foot slap.  Discussed plans for next visit, including possible discharge from therapy due to pt being on target for meeting goals.   PT Treatment/Interventions ADLs/Self Care Home Management;Therapeutic exercise;Therapeutic activities;Functional mobility training;Gait training;Balance training;Neuromuscular re-education;Patient/family education   PT Next Visit Plan Review full HEP and discuss how to progress; discuss community fitness and plan for discharge next visit   Consulted and Agree with Plan of Care Patient      Patient will benefit from skilled therapeutic intervention in order to improve the following deficits and impairments:  Abnormal gait, Decreased balance, Decreased mobility, Difficulty walking, Impaired flexibility, Postural dysfunction  Visit  Diagnosis: Other abnormalities of gait and mobility     Problem List There are no active problems to display for this patient.   Tanaiya Kolarik W. 01/16/2016, 2:19 PM  Frazier Butt., PT  [facility] [address] [place], [place], [address] Phone: [phone]   Fax:  [phone]  Name: [name] [name] MRN: [id number] Date of Birth: 01/22/1941

## 2016-01-21 ENCOUNTER — Ambulatory Visit: Payer: Medicare Other | Admitting: Occupational Therapy

## 2016-01-21 ENCOUNTER — Ambulatory Visit: Payer: Medicare Other | Admitting: Physical Therapy

## 2016-01-21 DIAGNOSIS — R293 Abnormal posture: Secondary | ICD-10-CM

## 2016-01-21 DIAGNOSIS — R278 Other lack of coordination: Secondary | ICD-10-CM

## 2016-01-21 DIAGNOSIS — R29898 Other symptoms and signs involving the musculoskeletal system: Secondary | ICD-10-CM | POA: Diagnosis not present

## 2016-01-21 DIAGNOSIS — R2689 Other abnormalities of gait and mobility: Secondary | ICD-10-CM

## 2016-01-21 DIAGNOSIS — R2681 Unsteadiness on feet: Secondary | ICD-10-CM

## 2016-01-21 DIAGNOSIS — R29818 Other symptoms and signs involving the nervous system: Secondary | ICD-10-CM

## 2016-01-21 NOTE — Patient Instructions (Signed)

## 2016-01-21 NOTE — Therapy (Signed)
[facility] [address] [place], [place], [address] Phone: [phone]   Fax:  [phone]  Physical Therapy Treatment  Patient Details  Name: MERRIT FRIESEN MRN: [id number] Date of Birth: November 17, 1940 Referring Provider: Dr. Erie Noe ([place])  Encounter Date: 01/21/2016      PT End of Session - 01/21/16 1042    Visit Number 9   Number of Visits 17   Date for PT Re-Evaluation 02/01/16   Authorization Type Medicare/Tricare 2nd-GCODE every 10th visit; No PTA   PT Start Time 0933   PT Stop Time 1021   PT Time Calculation (min) 48 min   Activity Tolerance Patient tolerated treatment well   Behavior During Therapy WFL for tasks assessed/performed      Past Medical History  Diagnosis Date  . Sleep apnea   . REM sleep behavior disorder     Past Surgical History  Procedure Laterality Date  . Appendectomy    . 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      Subjective Assessment - 01/21/16 0936    Subjective No new changes, no difficulties today.  Want to go over my exercises and make sure I'm doing them correctly.   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   Currently in Pain? Yes   Pain Score 3    Pain Location Shoulder   Pain Orientation Right   Pain Descriptors / Indicators Aching   Pain Onset 1 to 4 weeks ago   Pain Frequency Intermittent   Aggravating Factors  hurts with certain movements-overhead   Pain Relieving Factors exercises and repositioning            [facility] PT Assessment - 01/21/16 0001    Transfers   Transfers Sit to Stand;Stand to Sit   Sit to Stand 6: Modified independent (Device/Increase time);Without upper extremity assist;From chair/3-in-1   Five time sit to stand  comments  11.49    Stand to Sit 6: Modified independent (Device/Increase time);Without upper extremity assist;To chair/3-in-1   Ambulation/Gait   Ambulation/Gait Yes   Ambulation/Gait Assistance 7: Independent   Ambulation Distance (Feet) 1425 Feet  in 3 minute walk test   Assistive device None   Gait Pattern Decreased arm swing - right;Decreased trunk rotation   Ambulation Surface Level;Indoor   Gait velocity 9.57 sec= 3.43 ft/sec   Timed Up and Go Test   Normal TUG (seconds) 8.84   Functional Gait  Assessment   Gait assessed  Yes   Gait Level Surface Walks 20 ft in less than 5.5 sec, no assistive devices, good speed, no evidence for imbalance, normal gait pattern, deviates no more than 6 in outside of the 12 in walkway width.   Change in Gait Speed Able to smoothly change walking speed without loss of balance or gait deviation. Deviate no more than 6 in outside of the 12 in walkway width.   Gait with Horizontal Head Turns Performs head turns smoothly with slight change in gait velocity (eg, minor disruption to smooth gait path), deviates 6-10 in outside 12 in walkway width, or uses an assistive device.   Gait with Vertical Head Turns Performs task with slight change in gait velocity (eg, minor disruption to smooth gait path), deviates 6 - 10 in  outside 12 in walkway width or uses assistive device   Gait and Pivot Turn Pivot turns safely within 3 sec and stops quickly with no loss of balance.   Step Over Obstacle Is able to step over one shoe box (4.5 in total height) without changing gait speed. No evidence of imbalance.   Gait with Narrow Base of Support Is able to ambulate for 10 steps heel to toe with no staggering.   Gait with Eyes Closed Walks 20 ft, uses assistive device, slower speed, mild gait deviations, deviates 6-10 in outside 12 in walkway width. Ambulates 20 ft in less than 9 sec but greater than 7 sec.   Ambulating Backwards Walks 20 ft, no assistive devices, good speed, no  evidence for imbalance, normal gait   Steps Alternating feet, no rail.   Total Score 26   FGA comment: improved from 18/30                     [facility] Adult PT Treatment/Exercise - 01/21/16 0001    Self-Care   Self-Care Other Self-Care Comments   Other Self-Care Comments  Provided patient with community fitness information regarding PWR! Moves exercise classes, fall prevention information and plans for discharge.  Discussed screens/vs. PT eval return in 6-9 months.           PWR [facility]) - 01/21/16 5726    PWR! exercises Moves in standing   PWR! Up x 10   PWR! Rock x 10   PWR! Twist x 10   PWR Step x 10   Comments Review of HEP-pt demo understanding.  Performed standing PWR! moves in FLOW as progression option for HEP.  Pt performs standing rock and reach x 10 reps with coordinated arm swing-return demo understanding.             PT Education - 01/21/16 1042    Education provided Yes   Education Details Plans for discharge, return screen/eval in 6 months; community fitness   Person(s) Educated Patient   Methods Explanation;Demonstration;Handout   Comprehension Verbalized understanding;Returned demonstration          PT Short Term Goals - 01/02/16 0906    PT SHORT TERM GOAL #1   Title Pt will be independent with HEP for improved balance, gait, functional mobility.  TARGET 01/02/16   Time 4   Period Weeks   Status Achieved   PT SHORT TERM GOAL #2   Title Pt will improve TUG and TUG cognitive score to <10% difference to demonstrate improved dual tasking with gait activities.   Time 4   Period Weeks   Status Partially Met   PT SHORT TERM GOAL #3   Title Pt will demonstrate improved balance recovery, being able to take <2 steps in posterior and anterior push and release test.   Time 4   Period Weeks   Status Achieved   PT SHORT TERM GOAL #4   Title Pt will verbalize understanding of local Parkinson's disease related resources.   Time 4   Period Weeks    Status Achieved           PT Long Term Goals - 01/21/16 0954    PT LONG TERM GOAL #1   Title Pt will verbalize understanding of fall prevention in the home environment.  TARGET 02/01/16   Time 8   Period Weeks   Status Achieved   PT LONG TERM GOAL #2   Title Pt will improve Functional Gait Assessment to at least  22/30 for improved dynamic balance and gait.   Time 8   Period Weeks   Status Achieved   PT LONG TERM GOAL #3   Title 6 minute walk test to be completed, with pt improving distance in 6 minute walk test by at least 50 ft for improved gait efficiency.   Time 8   Period Weeks   Status Achieved   PT LONG TERM GOAL #4   Title Pt will verbalize understanding of optimal fitness plans upon D/C from PT.   Time 8   Period Weeks   Status Achieved               Plan - 28-Jan-2016 1043    Clinical Impression Statement Pt has met all LTGs.  Pt has made good progress with functional mobility and balance during the course of therapy.  Pt return demonstrates understanding of HEP and verbalizes plans for continued community fitness.  Pt is appropriate for discharge at this time.   PT Treatment/Interventions ADLs/Self Care Home Management;Therapeutic exercise;Therapeutic activities;Functional mobility training;Gait training;Balance training;Neuromuscular re-education;Patient/family education   PT Next Visit Plan Discharge this visit; return for PT screen in 6-9 months.   Consulted and Agree with Plan of Care Patient      Patient will benefit from skilled therapeutic intervention in order to improve the following deficits and impairments:  Abnormal gait, Decreased balance, Decreased mobility, Difficulty walking, Impaired flexibility, Postural dysfunction  Visit Diagnosis: Other abnormalities of gait and mobility       G-Codes - 01-28-16 1045    Functional Assessment Tool Used Functional Gait Assessment 26/30, TUG 8.84 sec,6 minute walk test:  1425 ft (improved from 1310 ft)    Functional Limitation Mobility: Walking and moving around   Mobility: Walking and Moving Around Current Status [phone]) --   Mobility: Walking and Moving Around Goal Status [phone]) At least 1 percent but less than 20 percent impaired, limited or restricted   Mobility: Walking and Moving Around Discharge Status [phone]) At least 1 percent but less than 20 percent impaired, limited or restricted      Problem List There are no active problems to display for this patient.   Edison Wollschlager W. 01-28-16, 10:47 AM Frazier Butt., PT  [facility] [address] [place], [place], [address] Phone: [phone]   Fax:  [phone]  Name: TRACEY STEWART MRN: [id number] Date of Birth: Aug 06, 1941   PHYSICAL THERAPY DISCHARGE SUMMARY  Visits from Start of Care: 9  Current functional level related to goals / functional outcomes:     PT Long Term Goals - 01-28-2016 0954    PT LONG TERM GOAL #1   Title Pt will verbalize understanding of fall prevention in the home environment.  TARGET 02/01/16   Time 8   Period Weeks   Status Achieved   PT LONG TERM GOAL #2   Title Pt will improve Functional Gait Assessment to at least 22/30 for improved dynamic balance and gait.   Time 8   Period Weeks   Status Achieved   PT LONG TERM GOAL #3   Title 6 minute walk test to be completed, with pt improving distance in 6 minute walk test by at least 50 ft for improved gait efficiency.   Time 8   Period Weeks   Status Achieved   PT LONG TERM GOAL #4   Title Pt will verbalize understanding of optimal fitness plans upon D/C from PT.   Time 8   Period Weeks  Status Achieved    Pt has met all LTGs.   Remaining deficits: Bradykinesia, stiffness/rigidity, balance   Education / Equipment: HEP, community fitness, fall prevention  Plan: Patient agrees to discharge.  Patient goals were met. Patient is being discharged due to meeting the stated rehab  goals.  ?????Recommend PT screen in 6-9 months.   Mady Haagensen, PT 01/21/2016 10:49 AM Phone: [phone] Fax: [phone]

## 2016-01-21 NOTE — Therapy (Signed)
[facility] [address] [place], [place], [address] Phone: [phone]   Fax:  [phone]  Occupational Therapy Treatment  Patient Details  Name: [name] [name] MRN: [id number] Date of Birth: 06-07-1941 Referring Provider: Dr. Erie Noe  Encounter Date: 01/21/2016      OT End of Session - 01/21/16 1419    Visit Number 9   Number of Visits 17   Date for OT Re-Evaluation 02/10/16   Authorization Type Medicare, Tricare (g-code)   Authorization Time Period re-cert period 03/15/23-10/11/51   Authorization - Visit Number 9   Authorization - Number of Visits 10   OT Start Time 1022   OT Stop Time 1103   OT Time Calculation (min) 41 min   Activity Tolerance Patient tolerated treatment well   Behavior During Therapy [facility] for tasks assessed/performed      Past Medical History  Diagnosis Date  . Sleep apnea   . REM sleep behavior disorder     Past Surgical History  Procedure Laterality Date  . Appendectomy    . 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      Subjective Assessment - 01/21/16 1417    Patient Stated Goals improve writing   Currently in Pain? Yes   Pain Score 3    Pain Location Shoulder   Pain Orientation Right   Pain Descriptors / Indicators Aching   Pain Frequency Intermittent   Aggravating Factors  sometimes with overhead movements in shoulder flex/abduction   Pain Relieving Factors exercise, repositioning      Neuro re-ed:  PWR! Moves (basic 4) in supine, prone, quadraped x 10 each with min cues For incr movement amplitude/performance. (particularly head/eye turns, R elbow ext, and finger ext).  Reviewed/educated pt regarding importance of trunk rotation/posture and positioning for decr shoulder strain/prevention of pain.  Pt verbalized understanding and reports R shoulder pain improved with exercise and no pain by end of session.      Reviewed the  following for coordination HEP.  Flipping cards with min cues for larger amplitude movements with R hand.  Rotating 2 small balls in each hand with min-mod difficulty/cues R hand, min difficulty L hand.  Rotating ball in fingertips with R hand with min-mod difficulty/cues--improved movement with this.                           OT Education - 01/21/16 1418    Education Details Reviewed PWR! HEP   Person(s) Educated Patient   Methods Explanation;Demonstration;Verbal cues   Comprehension Verbalized understanding;Returned demonstration          OT Short Term Goals - 01/14/16 0940    OT SHORT TERM GOAL #1   Title Pt will be independent with PD-specific HEP--check STGs 01/02/16   Time 4   Period Weeks   Status On-going  met with current HEP, but would benefit from updates   OT SHORT TERM GOAL #2   Title Pt will improve R shoulder flex to at least 135 with -10* elbow extension for functional reaching (without cues)   Baseline 125* with -20* elbow ext   Time 4   Period Weeks   Status Achieved  01/14/16:  met 135* with -10* elbow ext   OT SHORT TERM GOAL #3   Title Pt will improve coordination for ADLs as shown by improving time on 9-hole peg test by at least 5sec with R  hand.   Baseline 40.16sec   Time 4   Period Weeks   Status Achieved  01/14/16:  32.15sec   OT SHORT TERM GOAL #4   Title Pt will improve functional reaching/coordination for ADLs as shown by improving score on box and blocks test by at least 5 with RUE.   Baseline 36   Time 4   Period Weeks   Status Achieved  01/14/16:  46 blocks   OT SHORT TERM GOAL #5   Title Pt will demo incr ease with eating as shown by improving time on PPT#2 by at least 4sec.   Baseline 20.16sec   Time 4   Period Weeks   Status Achieved  01/14/15: 13.28sec   OT SHORT TERM GOAL #6   Title Pt will be able to write 1/2 page with no significant decr in size.   Time 4   Period Weeks   Status Achieved  01/14/16 met with  incr time, good legibility and size           OT Long Term Goals - 01/21/16 1422    OT LONG TERM GOAL #1   Title Pt will verbalize understanding of adaptive strategies to incr ease of ADLs/IADLs prn.   Time 8   Period Weeks   Status New   OT LONG TERM GOAL #2   Title Pt will demo incr ability to dress as shown by improving PPT#4 by at least 4sec   Baseline 21.25sec   Time 8   Period Weeks   Status New   OT LONG TERM GOAL #3   Title Pt will improve coordination for ADLs as shown by improving time on 9-hole peg test by at least 10sec with R hand.   Baseline 40.16   Time 8   Period Weeks   Status New   OT LONG TERM GOAL #4   Title Pt will improve functional reaching/coordination for ADLs as shown by improving score on box and blocks test by at least 10 with RUE.   Baseline 36   Time 8   Period Weeks   Status Achieved   OT LONG TERM GOAL #5   Title Pt will report eating with dominant RUE at least 90% of the time.   Baseline 50%   Time 8   Period Weeks   Status New   OT LONG TERM GOAL #6   Title Pt will verbalize understanding of ways to prevent future complications and PD-specific community resources prn.   Time 8   Period Weeks   Status New               Plan - 01/21/16 1420    Clinical Impression Statement Pt is progressing towards goals with improved performance of HEP and improved movement amplitude with less cueing.   Rehab Potential Good   OT Frequency 2x / week   OT Duration 8 weeks   OT Treatment/Interventions Self-care/ADL training;Neuromuscular education;Balance training;Patient/family education;Therapist, nutritional;Therapeutic exercise;Therapeutic exercises;Therapeutic activities;DME and/or AE instruction;Cryotherapy;Electrical Stimulation;Moist Heat;Passive range of motion;Cognitive remediation/compensation;Ultrasound;Energy conservation;Manual Therapy   Plan **G-code next visit; coordination, large amplitude movements for ADLs   OT Home  Exercise Plan Education provided:  Seated PWR! moves (basic 4), PWR! hands (basic 4), initiated coordination HEP with emphasis on large amplitude, PWR!modified quadraped, handwriting strategies, donning jacket/buttons, ways to prevent future complications; large amplitude movements for ADLs; 01/14/16 PWR! supine (basic 4); prone (basic 4)   Consulted and Agree with Plan of Care Patient      Patient  will benefit from skilled therapeutic intervention in order to improve the following deficits and impairments:  Decreased mobility, Impaired UE functional use, Decreased knowledge of use of DME, Decreased balance, Decreased activity tolerance, Impaired tone, Decreased coordination, Decreased range of motion, Improper spinal/pelvic alignment  Visit Diagnosis: Other symptoms and signs involving the musculoskeletal system  Other symptoms and signs involving the nervous system  Other lack of coordination  Abnormal posture  Unsteadiness on feet    Problem List There are no active problems to display for this patient.   [facility] 01/21/2016, 2:23 PM  [facility] [address] [place], [place], [address] Phone: [phone]   Fax:  [phone]  Name: KIPPY GOHMAN MRN: [id number] Date of Birth: 1940-09-18  Vianne Bulls, OTR/L [facility] [address] [place], [place]  [address] [phone] phone [phone] 01/21/2016 2:23 PM

## 2016-01-24 ENCOUNTER — Ambulatory Visit: Payer: Medicare Other | Admitting: Occupational Therapy

## 2016-01-24 ENCOUNTER — Ambulatory Visit: Payer: Medicare Other | Admitting: Physical Therapy

## 2016-01-24 DIAGNOSIS — R278 Other lack of coordination: Secondary | ICD-10-CM

## 2016-01-24 DIAGNOSIS — R2681 Unsteadiness on feet: Secondary | ICD-10-CM

## 2016-01-24 DIAGNOSIS — R29898 Other symptoms and signs involving the musculoskeletal system: Secondary | ICD-10-CM | POA: Diagnosis not present

## 2016-01-24 DIAGNOSIS — R29818 Other symptoms and signs involving the nervous system: Secondary | ICD-10-CM

## 2016-01-24 DIAGNOSIS — R293 Abnormal posture: Secondary | ICD-10-CM

## 2016-01-24 NOTE — Therapy (Signed)
[facility] [address] [place], [place], [address] Phone: [phone]   Fax:  [phone]  Occupational Therapy Treatment  Patient Details  Name: [name] [name] MRN: [id number] Date of Birth: 1940/11/26 Referring Provider: Dr. Erie Noe  Encounter Date: 01/24/2016      OT End of Session - 01/24/16 0939    Visit Number 10   Number of Visits 17   Date for OT Re-Evaluation 02/10/16   Authorization Type Medicare, Tricare (g-code)   Authorization Time Period re-cert period 02/11/58-09/12/36   Authorization - Visit Number 10   Authorization - Number of Visits 10   OT Start Time [phone]   OT Stop Time 1016   OT Time Calculation (min) 39 min   Activity Tolerance Patient tolerated treatment well   Behavior During Therapy [facility] for tasks assessed/performed      Past Medical History  Diagnosis Date  . Sleep apnea   . REM sleep behavior disorder     Past Surgical History  Procedure Laterality Date  . Appendectomy    . 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      Checked box/ blocks and , 9 hole peg test for RUE for G-code see below. Arm bike x 6 mins level1 for conditioning/ warm up, pt maintained> 40 RPM Standing to perform dynamic reaching in diagonal pattern with trunk rotation to toss scarves to target with bilateral UE's min v.c. for intensity/ larger amplitude Ambulating while tossing scarf between hands and performing category generation for  dual tasking, min-mod v.c. Placing grooved pegs in pegboard then removing using PWR! Step for bilateral UE's min v.c. For technique                        OT Short Term Goals - 01/14/16 0940    OT SHORT TERM GOAL #1   Title Pt will be independent with PD-specific HEP--check STGs 01/02/16   Time 4   Period Weeks   Status On-going  met with current HEP, but would benefit from updates   OT SHORT TERM GOAL #2   Title Pt will improve R shoulder flex to at least 135 with -10* elbow extension for functional reaching (without cues)   Baseline 125* with -20* elbow ext   Time 4   Period Weeks   Status Achieved  01/14/16:  met 135* with -10* elbow ext   OT SHORT TERM GOAL #3   Title Pt will improve coordination for ADLs as shown by improving time on 9-hole peg test by at least 5sec with R hand.   Baseline 40.16sec   Time 4   Period Weeks   Status Achieved  01/14/16:  32.15sec   OT SHORT TERM GOAL #4   Title Pt will improve functional reaching/coordination for ADLs as shown by improving score on box and blocks test by at least 5 with RUE.   Baseline 36   Time 4   Period Weeks   Status Achieved  01/14/16:  46 blocks   OT SHORT TERM GOAL #5   Title Pt will demo incr ease with eating as shown by improving time on PPT#2 by at least 4sec.   Baseline 20.16sec   Time 4   Period Weeks   Status Achieved  01/14/15: 13.28sec   OT SHORT TERM GOAL #6   Title Pt will be able to write 1/2 page with no significant decr in size.  Time 4   Period Weeks   Status Achieved  01/14/16 met with incr time, good legibility and size           OT Long Term Goals - 02/17/16 0950    OT LONG TERM GOAL #1   Title Pt will verbalize understanding of adaptive strategies to incr ease of ADLs/IADLs prn.   Time 8   Period Weeks   Status New   OT LONG TERM GOAL #2   Title Pt will demo incr ability to dress as shown by improving PPT#4 by at least 4sec   Baseline 21.25sec   Time 8   Period Weeks   Status New   OT LONG TERM GOAL #3   Title Pt will improve coordination for ADLs as shown by improving time on 9-hole peg test by at least 10sec with R hand.   Baseline 40.16   Time 8   Period Weeks   Status On-going  33.03 secs   OT LONG TERM GOAL #4   Title Pt will improve functional reaching/coordination for ADLs as shown by improving score on box and blocks test by at least 10 with RUE.   Baseline 36   Time 8   Period  Weeks   Status Achieved   OT LONG TERM GOAL #5   Title Pt will report eating with dominant RUE at least 90% of the time.   Baseline 50%   Time 8   Period Weeks   Status New   OT LONG TERM GOAL #6   Title Pt will verbalize understanding of ways to prevent future complications and PD-specific community resources prn.   Time 8   Period Weeks   Status New               Plan - 2016/02/17 1010    Clinical Impression Statement Pt is progressing towards goals for ADLS with large amplitude movements and fine motor coordination. Pt can benefit from continued skilled occupational therapy to address the following deficits:rigidity, bradykinesia, decreased coordination, decreased balance, tremor in order to maximize independence with ADLs/ IADLs    Rehab Potential Good   OT Frequency 2x / week   OT Duration 8 weeks   OT Treatment/Interventions Self-care/ADL training;Neuromuscular education;Balance training;Patient/family education;Therapist, nutritional;Therapeutic exercise;Therapeutic exercises;Therapeutic activities;DME and/or AE instruction;Cryotherapy;Electrical Stimulation;Moist Heat;Passive range of motion;Cognitive remediation/compensation;Ultrasound;Energy conservation;Manual Therapy   Plan check long term goals, renew, large amplitude movements with ADLs.   OT Home Exercise Plan Education provided:  Seated PWR! moves (basic 4), PWR! hands (basic 4), initiated coordination HEP with emphasis on large amplitude, PWR!modified quadraped, handwriting strategies, donning jacket/buttons, ways to prevent future complications; large amplitude movements for ADLs; 01/14/16 PWR! supine (basic 4); prone (basic 4)   Consulted and Agree with Plan of Care Patient      Patient will benefit from skilled therapeutic intervention in order to improve the following deficits and impairments:  bradykinesia, decreased coordination, rigidity,Decreased mobility, Impaired UE functional use, Decreased knowledge  of use of DME, Decreased balance, Decreased activity tolerance, Impaired tone, Decreased coordination, Decreased range of motion, Improper spinal/pelvic alignmenttremor, abnormal posture, decreased balance  Visit Diagnosis: Other symptoms and signs involving the musculoskeletal system  Other symptoms and signs involving the nervous system  Other lack of coordination  Abnormal posture  Unsteadiness on feet      G-Codes - 02-17-2016 0940    Functional Assessment Tool Used 9 hole peg test RUE 33.03 secs     Box/ blocks 44, 49 blocks   eating  with RUE 60% of the time   Carrying, Moving and Handling Objects Current Status [phone]) At least 20 percent but less than 40 percent impaired, limited or restricted   Carrying, Moving and Handling Objects Goal Status ([id number]) At least 1 percent but less than 20 percent impaired, limited or restricted    Occupational Therapy Progress Note  Dates of Reporting Period: 12/03/15 to 01/24/16  Objective Reports of Subjective Statement: see above  Objective Measurements: see above  Goal Update: Pt met all short term goals, he is progressing towards long term goals  Plan: Continue to work towards unmet LTG's   Reason Skilled Services are Required: see clinical impressions  There are no active problems to display for this patient.   RINE,KATHRYN 01/24/2016, 3:43 PM Theone Murdoch, OTR/L [phone] Phone: [phone] 3:43 PM 01/24/2016 [facility] [address] [place], [place], [address] Phone: [phone]   Fax:  [phone]  Name: KAYIN OSMENT MRN: [id number] Date of Birth: 10/13/1940

## 2016-01-27 ENCOUNTER — Encounter: Payer: Medicare Other | Admitting: Occupational Therapy

## 2016-01-28 ENCOUNTER — Ambulatory Visit: Payer: Medicare Other | Admitting: Physical Therapy

## 2016-01-28 ENCOUNTER — Encounter: Payer: Medicare Other | Admitting: Occupational Therapy

## 2016-01-30 ENCOUNTER — Encounter: Payer: Medicare Other | Admitting: Occupational Therapy

## 2016-01-30 ENCOUNTER — Ambulatory Visit: Payer: Medicare Other | Admitting: Physical Therapy

## 2016-02-04 ENCOUNTER — Ambulatory Visit: Payer: Medicare Other | Admitting: Occupational Therapy

## 2016-02-04 ENCOUNTER — Encounter: Payer: Medicare Other | Admitting: Occupational Therapy

## 2016-02-04 DIAGNOSIS — R29818 Other symptoms and signs involving the nervous system: Secondary | ICD-10-CM

## 2016-02-04 DIAGNOSIS — R293 Abnormal posture: Secondary | ICD-10-CM

## 2016-02-04 DIAGNOSIS — R29898 Other symptoms and signs involving the musculoskeletal system: Secondary | ICD-10-CM

## 2016-02-04 DIAGNOSIS — R2681 Unsteadiness on feet: Secondary | ICD-10-CM

## 2016-02-04 DIAGNOSIS — R278 Other lack of coordination: Secondary | ICD-10-CM

## 2016-02-04 NOTE — Therapy (Signed)
[facility] [address] [place], [place], [address] Phone: [phone]   Fax:  [phone]  Occupational Therapy Treatment  Patient Details  Name: [name] [name] MRN: [id number] Date of Birth: 1941-07-10 Referring Provider: Dr. Erie Noe  Encounter Date: 02/04/2016      OT End of Session - 02/04/16 1039    Visit Number 11   Number of Visits 17   Date for OT Re-Evaluation 02/10/16   Authorization Type Medicare, T[employer]are (g-code)   Authorization Time Period re-cert period 02/13/61-05/13/27   Authorization - Visit Number 11   Authorization - Number of Visits 20   OT Start Time [phone]   OT Stop Time 1020   OT Time Calculation (min) 43 min   Activity Tolerance Patient tolerated treatment well   Behavior During Therapy [facility] for tasks assessed/performed      Past Medical History  Diagnosis Date  . Sleep apnea   . REM sleep behavior disorder     Past Surgical History  Procedure Laterality Date  . Appendectomy    . 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   Patient Stated Goals improve writing   Currently in Pain? No/denies         Treatment: Reviewed PWR! Exercises basic 4 in prone, 10-20 reps each , min v.c. And demonstration for larger amplitude movements.  Reviewed donning/ doffing jacket with adapted strategy, pt returned demonstration. (Therapist checked associated LTG and pt made excellent progress.) Cutting food using larger amplitude movements with improved performance. Reviewed community resources and ways to prevent future PD-related complications Handwriting strategies with improved letter size with min v.c. And PWR! Hands. Ambulating will  tossing scarf between hands and carrying on conversation for cognitive component/ dual tasking.                        OT Short Term Goals - 01/14/16 0940    OT SHORT TERM GOAL #1   Title Pt will be independent with PD-specific HEP--check STGs 01/02/16   Time 4   Period Weeks   Status On-going  met with current HEP, but would benefit from updates   OT SHORT TERM GOAL #2   Title Pt will improve R shoulder flex to at least 135 with -10* elbow extension for functional reaching (without cues)   Baseline 125* with -20* elbow ext   Time 4   Period Weeks   Status Achieved  01/14/16:  met 135* with -10* elbow ext   OT SHORT TERM GOAL #3   Title Pt will improve coordination for ADLs as shown by improving time on 9-hole peg test by at least 5sec with R hand.   Baseline 40.16sec   Time 4   Period Weeks   Status Achieved  01/14/16:  32.15sec   OT SHORT TERM GOAL #4   Title Pt will improve functional reaching/coordination for ADLs as shown by improving score on box and blocks test by at least 5 with RUE.   Baseline 36   Time 4   Period Weeks   Status Achieved  01/14/16:  46 blocks   OT SHORT TERM GOAL #5   Title Pt will demo  incr ease with eating as shown by improving time on PPT#2 by at least 4sec.   Baseline 20.16sec   Time 4   Period Weeks   Status Achieved  01/14/15: 13.28sec   OT SHORT TERM GOAL #6   Title Pt will be able to write 1/2 page with no significant decr in size.   Time 4   Period Weeks   Status Achieved  01/14/16 met with incr time, good legibility and size           OT Long Term Goals - 02/04/16 0948    OT LONG TERM GOAL #1   Title Pt will verbalize understanding of adaptive strategies to incr ease of ADLs/IADLs prn.   Time 8   Period Weeks   Status On-going   OT LONG TERM GOAL #2   Title Pt will demo incr ability to dress as shown by improving PPT#4 by at least 4sec   Baseline 21.25sec   Time 8   Period Weeks   Status Achieved  7.90   OT  LONG TERM GOAL #3   Title Pt will improve coordination for ADLs as shown by improving time on 9-hole peg test by at least 10sec with R hand.   Time 8   Period Weeks   Status On-going   OT LONG TERM GOAL #4   Title Pt will improve functional reaching/coordination for ADLs as shown by improving score on box and blocks test by at least 10 with RUE.   Time 8   Period Weeks   Status Achieved   OT LONG TERM GOAL #5   Title Pt will report eating with dominant RUE at least 90% of the time.   Time 8   Period Weeks   Status On-going   improved 75%   OT LONG TERM GOAL #6   Title Pt will verbalize understanding of ways to prevent future complications and PD-specific community resources prn.   Time 8   Period Weeks   Status Achieved               Plan - 02/04/16 1036    Clinical Impression Statement Pt demonstrates good overall progress. Pt reports he feels as thought he is nearing discharge and pt would like to wrap up next visit.   Rehab Potential Good   OT Frequency 2x / week   OT Duration 8 weeks   Plan finish checking goals, review exercises anticipate d/c   OT Home Exercise Plan Education provided:  Seated PWR! moves (basic 4), PWR! hands (basic 4), initiated coordination HEP with emphasis on large amplitude, PWR!modified quadraped, handwriting strategies, donning jacket/buttons, ways to prevent future complications; large amplitude movements for ADLs; 01/14/16 PWR! supine (basic 4); prone (basic 4)   Consulted and Agree with Plan of Care Patient      Patient will benefit from skilled therapeutic intervention in order to improve the following deficits and impairments:  Decreased mobility, Impaired UE functional use, Decreased knowledge of use of DME, Decreased balance, Decreased activity tolerance, Impaired tone, Decreased coordination, Decreased range of motion, Improper spinal/pelvic alignment  Visit Diagnosis: Other symptoms and signs involving the musculoskeletal  system  Other symptoms and signs involving the nervous system  Other lack of coordination  Abnormal posture  Unsteadiness on feet    Problem List There are no active problems to display for this patient.   [name] [name] 02/04/2016, 10:40 AM Theone Murdoch, OTR/L [phone] Phone: [phone] 10:40 AM 02/04/2016 [facility]  [address] [place], [place], [address] Phone: [phone]   Fax:  [phone]  Name: [name] [name] MRN: [id number] Date of Birth: 1941/09/04

## 2016-02-06 ENCOUNTER — Ambulatory Visit: Payer: Medicare Other | Attending: Neurology | Admitting: Occupational Therapy

## 2016-02-06 DIAGNOSIS — R293 Abnormal posture: Secondary | ICD-10-CM

## 2016-02-06 DIAGNOSIS — R278 Other lack of coordination: Secondary | ICD-10-CM | POA: Diagnosis present

## 2016-02-06 DIAGNOSIS — R29818 Other symptoms and signs involving the nervous system: Secondary | ICD-10-CM

## 2016-02-06 DIAGNOSIS — R29898 Other symptoms and signs involving the musculoskeletal system: Secondary | ICD-10-CM | POA: Insufficient documentation

## 2016-02-06 NOTE — Therapy (Signed)
[facility] [address] [place], [place], [address] Phone: [phone]   Fax:  [phone]  Occupational Therapy Treatment  Patient Details  Name: [name] [name] MRN: [id number] Date of Birth: August 09, 1941 Referring Provider: Dr. Erie Noe  Encounter Date: 02/06/2016      OT End of Session - 02/06/16 1313    Visit Number 12   Number of Visits 17   Date for OT Re-Evaluation 02/10/16   Authorization Type Medicare, T[employer]are (g-code)   Authorization Time Period re-cert period 01/13/73-09/12/36   Authorization - Visit Number 12   Authorization - Number of Visits 20   OT Start Time 0850   OT Stop Time 0920   OT Time Calculation (min) 30 min   Activity Tolerance Patient tolerated treatment well   Behavior During Therapy [facility] for tasks assessed/performed      Past Medical History  Diagnosis Date  . Sleep apnea   . REM sleep behavior disorder     Past Surgical History  Procedure Laterality Date  . Appendectomy    . 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   Patient Stated Goals improve writing   Currently in Pain? No/denies           Therapist checked progress towards goals and discussed overall progress with pt. Pt agrees with plans for a screen in 6 mons and he is aware of community exercise and support group options. Arm bike x 6 mins level 1 for conditioning, pt maintains 40 RPM. Reviewed activities from coordination HEP, stacking and manipulating coins and rotating1 to 2 balls in hand, min v.c then pt returned demonstration.                     OT Short Term Goals - 02/06/16 0856    OT SHORT TERM GOAL #1   Title Pt will be independent with PD-specific HEP--check STGs 01/02/16   Time 4   Period Weeks   Status Achieved  met with current HEP, but would benefit from updates   OT SHORT TERM GOAL #2   Title Pt will improve R shoulder flex to at least 135 with -10* elbow extension for functional reaching (without cues)   Baseline 125* with -20* elbow ext   Time 4   Period Weeks   Status Achieved  01/14/16:  met 135* with -10* elbow ext   OT SHORT TERM GOAL #3   Title Pt will improve coordination for ADLs as shown by improving time on 9-hole peg test by at least 5sec with R hand.   Baseline 40.16sec   Time 4   Period Weeks   Status Achieved  01/14/16:  32.15sec   OT SHORT TERM GOAL #4   Title Pt will improve functional reaching/coordination for ADLs as shown by improving score on box and blocks test by at least 5 with RUE.   Baseline 36   Time 4   Period Weeks   Status Achieved  01/14/16:  46 blocks   OT SHORT TERM GOAL #5   Title Pt will demo incr ease with eating as shown by improving time on PPT#2 by at least 4sec.   Baseline 20.16sec   Time 4   Period Weeks   Status  Achieved  01/14/15: 13.28sec   OT SHORT TERM GOAL #6   Title Pt will be able to write 1/2 page with no significant decr in size.   Time 4   Period Weeks   Status Achieved  01/14/16 met with incr time, good legibility and size           OT Long Term Goals - 02-22-2016 0858    OT LONG TERM GOAL #1   Title Pt will verbalize understanding of adaptive strategies to incr ease of ADLs/IADLs prn.   Time 8   Period Weeks   Status Achieved   OT LONG TERM GOAL #2   Title Pt will demo incr ability to dress as shown by improving PPT#4 by at least 4sec   Baseline 21.25sec   Time 8   Period Weeks   Status Achieved   OT LONG TERM GOAL #3   Title Pt will improve coordination for ADLs as shown by improving time on 9-hole peg test by at least 10sec with R hand.   Time 8   Period Weeks   Status Achieved  27.74 secs   OT LONG TERM GOAL #4   Title Pt will improve functional reaching/coordination for ADLs as shown  by improving score on box and blocks test by at least 10 with RUE.   Time 8   Period Weeks   Status Achieved   OT LONG TERM GOAL #5   Title Pt will report eating with dominant RUE at least 90% of the time.   Time 8   Period Weeks   Status Not Met  75%   OT LONG TERM GOAL #6   Title Pt will verbalize understanding of ways to prevent future complications and PD-specific community resources prn.   Time 8   Period Weeks   Status Achieved               Plan - Feb 22, 2016 1311    Clinical Impression Statement Pt made excellent overall progress. Pt agrees with plans to discharge today.   Rehab Potential Good   OT Frequency 2x / week   OT Duration 8 weeks   OT Treatment/Interventions Self-care/ADL training;Neuromuscular education;Balance training;Patient/family education;Therapist, nutritional;Therapeutic exercise;Therapeutic exercises;Therapeutic activities;DME and/or AE instruction;Cryotherapy;Electrical Stimulation;Moist Heat;Passive range of motion;Cognitive remediation/compensation;Ultrasound;Energy conservation;Manual Therapy   Plan discharge OT   OT Home Exercise Plan Education provided:  Seated PWR! moves (basic 4), PWR! hands (basic 4), initiated coordination HEP with emphasis on large amplitude, PWR!modified quadraped, handwriting strategies, donning jacket/buttons, ways to prevent future complications; large amplitude movements for ADLs; 01/14/16 PWR! supine (basic 4); prone (basic 4)   Consulted and Agree with Plan of Care Patient      Patient will benefit from skilled therapeutic intervention in order to improve the following deficits and impairments:  Decreased mobility, Impaired UE functional use, Decreased knowledge of use of DME, Decreased balance, Decreased activity tolerance, Impaired tone, Decreased coordination, Decreased range of motion, Improper spinal/pelvic alignment  Visit Diagnosis: Other symptoms and signs involving the musculoskeletal system  Other  symptoms and signs involving the nervous system  Other lack of coordination  Abnormal posture      G-Codes - February 22, 2016 0915    Functional Assessment Tool Used 9 hole peg test RUE 27.74 secs, eating 75% of the time with RUE, box/ blocks RUE 46 blocks    Functional Limitation Carrying, moving and handling objects   Carrying, Moving and Handling Objects Goal Status (C9449) At least 1 percent but less than 20 percent  impaired, limited or restricted   Carrying, Moving and Handling Objects Discharge Status [phone]) At least 1 percent but less than 20 percent impaired, limited or restricted     OCCUPATIONAL THERAPY DISCHARGE SUMMARY   Current functional level related to goals / functional outcomes: Pt made excellent progress and achieved 5/6 long term goals.   Remaining deficits: Decreased coordination, rigidity, bradykinesia, decreased balance, abnormal posture   Education / Equipment: Pt was educated regarding: HEP, community resources, ways to prevent future PD- related complications and adapted strategies for ADLs. Pt verbalizes understanding of all education. HE can benefit from a PD screen in 6 mons due to the progressive nature of his diagnosis. Plan: Patient agrees to discharge.  Patient goals were partially met. Patient is being discharged due to being pleased with the current functional level.  ?????     Problem List There are no active problems to display for this patient.   RINE,KATHRYN 02/06/2016, 1:21 PM Theone Murdoch, OTR/L [phone] Phone: [phone] 1:21 PM 02/06/2016 [facility] [address] [place], [place], [address] Phone: [phone]   Fax:  [phone]  Name: [name] [name] MRN: [id number] Date of Birth: 06/15/1941

## 2016-02-11 ENCOUNTER — Encounter: Payer: Medicare Other | Admitting: Occupational Therapy

## 2016-02-13 ENCOUNTER — Encounter: Payer: Medicare Other | Admitting: Occupational Therapy

## 2016-02-18 ENCOUNTER — Encounter: Payer: Medicare Other | Admitting: Occupational Therapy

## 2016-02-20 ENCOUNTER — Encounter: Payer: Medicare Other | Admitting: Occupational Therapy

## 2016-02-25 ENCOUNTER — Encounter: Payer: Medicare Other | Admitting: Occupational Therapy

## 2016-02-27 ENCOUNTER — Ambulatory Visit: Payer: Medicare Other | Admitting: Occupational Therapy

## 2016-08-25 ENCOUNTER — Ambulatory Visit: Payer: Medicare Other | Attending: Neurology | Admitting: Physical Therapy

## 2016-08-25 DIAGNOSIS — R2689 Other abnormalities of gait and mobility: Secondary | ICD-10-CM

## 2016-08-25 NOTE — Therapy (Signed)
[facility] [address] [place], [place], [address] Phone: [phone]   Fax:  [phone]  Patient Details  Name: [name] [name] MRN: [id number] Date of Birth: [birth year] Referring Provider:  Crecencio Mc,*  Encounter Date: 08/25/2016  Physical Therapy Parkinson's Disease Screen   Timed Up and Go test:10.08 sec  10 meter walk test:10.21 sec (3.21 ft/sec)  5 time sit to stand test:11.05 sec    Patient does not require Physical Therapy services at this time.  Recommend Physical Therapy screen in 6 months.  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  He reports he will attempt to return to consistent HEP performance.     Jadarion Halbig W. 08/25/2016, 1:19 PM  Frazier Butt., PT  [facility] [address] [place], [place], [address] Phone: [phone]   Fax:  [phone]

## 2016-09-01 ENCOUNTER — Ambulatory Visit: Payer: Medicare Other | Admitting: Occupational Therapy

## 2016-09-01 ENCOUNTER — Ambulatory Visit: Payer: Medicare Other

## 2016-09-01 ENCOUNTER — Ambulatory Visit: Payer: Medicare Other | Admitting: Physical Therapy

## 2016-09-08 ENCOUNTER — Other Ambulatory Visit: Payer: Self-pay | Admitting: Internal Medicine

## 2016-09-08 DIAGNOSIS — M5431 Sciatica, right side: Secondary | ICD-10-CM

## 2016-09-15 ENCOUNTER — Ambulatory Visit
Admission: RE | Admit: 2016-09-15 | Discharge: 2016-09-15 | Disposition: A | Discharge status: Home / Self Care | Payer: Medicare Other | Source: Ambulatory Visit | Attending: Internal Medicine | Admitting: Internal Medicine

## 2016-09-15 DIAGNOSIS — M545 Low back pain: Secondary | ICD-10-CM | POA: Diagnosis not present

## 2016-09-15 DIAGNOSIS — M5431 Sciatica, right side: Secondary | ICD-10-CM | POA: Diagnosis present

## 2016-09-15 DIAGNOSIS — M48061 Spinal stenosis, lumbar region without neurogenic claudication: Secondary | ICD-10-CM | POA: Insufficient documentation

## 2016-09-15 DIAGNOSIS — M5126 Other intervertebral disc displacement, lumbar region: Secondary | ICD-10-CM | POA: Diagnosis not present

## 2016-10-01 DIAGNOSIS — M5416 Radiculopathy, lumbar region: Secondary | ICD-10-CM | POA: Diagnosis not present

## 2016-10-05 DIAGNOSIS — G20 Parkinson's disease: Secondary | ICD-10-CM | POA: Diagnosis not present

## 2016-10-15 ENCOUNTER — Telehealth: Payer: Self-pay

## 2016-10-15 ENCOUNTER — Ambulatory Visit: Payer: Medicare Other | Attending: Neurology | Admitting: Occupational Therapy

## 2016-10-15 ENCOUNTER — Ambulatory Visit: Payer: Medicare Other | Admitting: Physical Therapy

## 2016-10-15 ENCOUNTER — Ambulatory Visit: Payer: Medicare Other | Admitting: Speech Pathology

## 2016-10-15 DIAGNOSIS — R471 Dysarthria and anarthria: Secondary | ICD-10-CM

## 2016-10-15 DIAGNOSIS — R278 Other lack of coordination: Secondary | ICD-10-CM | POA: Insufficient documentation

## 2016-10-15 DIAGNOSIS — R1312 Dysphagia, oropharyngeal phase: Secondary | ICD-10-CM

## 2016-10-15 NOTE — Therapy (Signed)
[address] [place], [place], [address] Phone: [phone]   Fax:  [phone]  Patient Details  Name: [name] [name] MRN: [id number] Date of Birth: [birth year] Referring Provider:  Crecencio Mc,*  Encounter Date: 10/15/2016  Speech Therapy Parkinson's Disease Screen         Decibel Level today: 69dB  (WNL=70-72 dB), with sound level meter 30cm away from pt's mouth.  Sustained /a/ 66.1 dB. Pt reports his wife complains of difficulty hearing him in the last year.   Pt complains of getting "choked up every now and then," about "one out of every ten times" which has occured with increasing frequency in the last year. Endorses difficulty with crackers.   Recommend clinical swallowing evaluation and assessment for dysarthria to determine need for skilled SLP intervention to address functional deficits in swallowing and communication.  [facility], [place] CF-SLP Speech-Language Pathologist  Aliene Altes 10/15/2016, 9:55 AM  [facility] [address] [place], [place], [address] Phone: [phone]   Fax:  [phone]

## 2016-10-15 NOTE — Telephone Encounter (Signed)
Dr. Anna Genre- We saw [name] [name] in our multi-disciplinary Parkinson's disease clinic today and as a result, a speech therapy evaluation was recommended due to decr'd voice volume and difficulty with liquids. If you agree, please fax an order to [phone].   Please call with any questions you may have.  Thank you. Garald Balding, ,[place], [place] [phone]

## 2016-10-15 NOTE — Therapy (Signed)
[facility] [address] [place], [place], [address] Phone: [phone]   Fax:  [phone]  Patient Details  Name: [name] [name] MRN: [id number] Date of Birth: 07/20/1941 Referring Provider:  Crecencio Mc,*  Encounter Date: 10/15/2016  Occupational Therapy Parkinson's Disease Screen Physical Performance Test item #2 (simulated eating):  14.22sec    9 hole peg test:    RUE  38.31sec        LUE  30.12sec  Change in ability to perform ADLs/IADLs:  Pt denies change in ADLs  Handwriting is legible, but demonstrates mild micrographia.   Pt does not require occupational therapy services at this time.  Recommended occupational therapy screen in   6 mons  [name] [name] 10/15/2016, 11:14 AM  [facility] [address] [place], [place], [address] Phone: [phone]   Fax:  [phone]

## 2016-10-23 DIAGNOSIS — M5126 Other intervertebral disc displacement, lumbar region: Secondary | ICD-10-CM | POA: Diagnosis not present

## 2016-10-23 DIAGNOSIS — M5416 Radiculopathy, lumbar region: Secondary | ICD-10-CM | POA: Diagnosis not present

## 2017-02-11 DIAGNOSIS — G4752 REM sleep behavior disorder: Secondary | ICD-10-CM | POA: Diagnosis not present

## 2017-02-11 DIAGNOSIS — Z9989 Dependence on other enabling machines and devices: Secondary | ICD-10-CM | POA: Diagnosis not present

## 2017-02-11 DIAGNOSIS — G4733 Obstructive sleep apnea (adult) (pediatric): Secondary | ICD-10-CM | POA: Diagnosis not present

## 2017-02-17 DIAGNOSIS — G20 Parkinson's disease: Secondary | ICD-10-CM | POA: Diagnosis not present

## 2017-02-17 DIAGNOSIS — Z9989 Dependence on other enabling machines and devices: Secondary | ICD-10-CM | POA: Diagnosis not present

## 2017-02-17 DIAGNOSIS — G4733 Obstructive sleep apnea (adult) (pediatric): Secondary | ICD-10-CM | POA: Diagnosis not present

## 2017-04-15 ENCOUNTER — Ambulatory Visit: Payer: Medicare Other | Attending: Internal Medicine | Admitting: Occupational Therapy

## 2017-04-15 ENCOUNTER — Ambulatory Visit: Payer: Medicare Other

## 2017-04-15 ENCOUNTER — Ambulatory Visit: Payer: Medicare Other | Admitting: Physical Therapy

## 2017-04-15 DIAGNOSIS — X32XXXA Exposure to sunlight, initial encounter: Secondary | ICD-10-CM | POA: Diagnosis not present

## 2017-04-15 DIAGNOSIS — R278 Other lack of coordination: Secondary | ICD-10-CM | POA: Insufficient documentation

## 2017-04-15 DIAGNOSIS — D485 Neoplasm of uncertain behavior of skin: Secondary | ICD-10-CM | POA: Diagnosis not present

## 2017-04-15 DIAGNOSIS — R29818 Other symptoms and signs involving the nervous system: Secondary | ICD-10-CM | POA: Insufficient documentation

## 2017-04-15 DIAGNOSIS — D2262 Melanocytic nevi of left upper limb, including shoulder: Secondary | ICD-10-CM | POA: Diagnosis not present

## 2017-04-15 DIAGNOSIS — D2261 Melanocytic nevi of right upper limb, including shoulder: Secondary | ICD-10-CM | POA: Diagnosis not present

## 2017-04-15 DIAGNOSIS — R1310 Dysphagia, unspecified: Secondary | ICD-10-CM | POA: Insufficient documentation

## 2017-04-15 DIAGNOSIS — D2272 Melanocytic nevi of left lower limb, including hip: Secondary | ICD-10-CM | POA: Diagnosis not present

## 2017-04-15 DIAGNOSIS — R471 Dysarthria and anarthria: Secondary | ICD-10-CM

## 2017-04-15 DIAGNOSIS — D225 Melanocytic nevi of trunk: Secondary | ICD-10-CM | POA: Diagnosis not present

## 2017-04-15 DIAGNOSIS — L814 Other melanin hyperpigmentation: Secondary | ICD-10-CM | POA: Diagnosis not present

## 2017-04-15 DIAGNOSIS — L570 Actinic keratosis: Secondary | ICD-10-CM | POA: Diagnosis not present

## 2017-04-15 NOTE — Therapy (Signed)
[facility] [address] [place], [place], [address] Phone: [phone]   Fax:  [phone]  Patient Details  Name: [name] [name] MRN: [id number] Date of Birth: 1941-08-10 Referring Provider:  Madelyn Brunner, MD  Encounter Date: 04/15/2017  Speech Therapy Parkinson's Disease Screen   Decibel Level today: mid to upper 60s dB  (WNL=70-72 dB) with sound level meter 30cm away from pt's mouth. Pt's conversational is below WNL.  At patient's last screen he reported difficulty swallowing liquids.  Pt would benefit from speech-language eval for dysarthria and possibly a bedside swallow eval.   Makaelah Cranfield ,MS, CCC-SLP  04/15/2017, 12:14 PM  [facility] [address] [place], [place], [address] Phone: [phone]   Fax:  [phone]

## 2017-04-15 NOTE — Therapy (Signed)
[facility] [address] [place], [place], [address] Phone: [phone]   Fax:  [phone]  Patient Details  Name: [name] [name] MRN: [id number] Date of Birth: 31-Jul-1941 Referring Provider:  Madelyn Brunner, MD  Encounter Date: 04/15/2017  Physical Therapy Parkinson's Disease Screen   Timed Up and Go test: 9.57 sec  10 meter walk test: 3.42 ft/sec  5 time sit to stand test: 9.09 sec   Patient does not require Physical Therapy services at this time.  Recommend Physical Therapy screen in 6 months.   Patient reports he has been more active this summer with walking and outdoor activities. He has not been doing his HEP and discussed signing up for PWR! Exercise class. Patient was given information for class in [place] as he prefers not to drive to [place] for the class.           Rexanne Mano, PT 04/15/2017, 8:09 AM  [facility] [address] [place], [place], [address] Phone: [phone]   Fax:  [phone]

## 2017-04-15 NOTE — Therapy (Deleted)
[facility] [address] [place], [place], [address] Phone: [phone]   Fax:  [phone]  Occupational Therapy Treatment  Patient Details  Name: EWART CARRERA MRN: [id number] Date of Birth: [birth year] No Data Recorded  Encounter Date: 04/15/2017    Past Medical History:  Diagnosis Date  . REM sleep behavior disorder   . Sleep apnea     Past Surgical History:  Procedure Laterality Date  . APPENDECTOMY    . 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                                      Patient will benefit from skilled therapeutic intervention in order to improve the following deficits and impairments:     Visit Diagnosis: No diagnosis found.    Problem List There are no active problems to display for this patient.   RINE,KATHRYN 04/15/2017, 8:08 AM  [facility] [address] [place], [place], [address] Phone: [phone]   Fax:  [phone]  Name: ERI PLATTEN MRN: [id number] Date of Birth: 04-06-1941

## 2017-04-15 NOTE — Therapy (Signed)
[facility] [address] [place], [place], [address] Phone: [phone]   Fax:  [phone]  Patient Details  Name: [name] [name] MRN: [id number] Date of Birth: Apr 26, 1941 Referring Provider:  Madelyn Brunner, MD  Encounter Date: 04/15/2017 Occupational Therapy Parkinson's Disease Screen  Hand dominance:  RUE  9-hole peg test:    RUE  41.31 sec        LUE  29.97 sec  Box & Blocks Test:   RUE  43 blocks        LUE  47 blocks  Change in ability to perform ADLs/IADLs:  Pt reports he has been using his left hand more due to difficulty using RUE. Pt demonstrates limited RUE shoulder ROM.  Handwriting is good letter size and legibility with printing.  Pt would benefit from occupational therapy evaluation due to  Decline in LUE coordination and ROM for ADLs.    RINE,KATHRYN 04/15/2017, 8:09 AM Theone Murdoch, OTR/L [phone] Phone: [phone] 8:37 AM 04/15/17 [facility] [address] [place], [place], [address] Phone: [phone]   Fax:  [phone]

## 2017-04-22 DIAGNOSIS — G20 Parkinson's disease: Secondary | ICD-10-CM | POA: Diagnosis not present

## 2017-05-24 ENCOUNTER — Ambulatory Visit: Payer: Medicare Other | Admitting: Speech Pathology

## 2017-05-24 ENCOUNTER — Ambulatory Visit: Payer: Medicare Other | Attending: Internal Medicine | Admitting: Occupational Therapy

## 2017-05-24 DIAGNOSIS — R471 Dysarthria and anarthria: Secondary | ICD-10-CM | POA: Insufficient documentation

## 2017-05-24 DIAGNOSIS — R29818 Other symptoms and signs involving the nervous system: Secondary | ICD-10-CM | POA: Diagnosis not present

## 2017-05-24 DIAGNOSIS — R29898 Other symptoms and signs involving the musculoskeletal system: Secondary | ICD-10-CM | POA: Diagnosis not present

## 2017-05-24 DIAGNOSIS — R2689 Other abnormalities of gait and mobility: Secondary | ICD-10-CM | POA: Diagnosis not present

## 2017-05-24 DIAGNOSIS — R1312 Dysphagia, oropharyngeal phase: Secondary | ICD-10-CM | POA: Insufficient documentation

## 2017-05-24 DIAGNOSIS — R293 Abnormal posture: Secondary | ICD-10-CM | POA: Insufficient documentation

## 2017-05-24 DIAGNOSIS — R278 Other lack of coordination: Secondary | ICD-10-CM | POA: Diagnosis not present

## 2017-05-24 DIAGNOSIS — R2681 Unsteadiness on feet: Secondary | ICD-10-CM | POA: Diagnosis not present

## 2017-05-24 NOTE — Therapy (Signed)
[facility] [address] [place], [place], [address] Phone: [phone]   Fax:  [phone]  Speech Language Pathology Evaluation  Patient Details  Name: [name] [name] MRN: [id number] Date of Birth: 14-Dec-1940 Referring Provider: Clyde Canterbury PA  Encounter Date: 05/24/2017      End of Session - 05/24/17 1315    Visit Number 1   Number of Visits 17   Date for SLP Re-Evaluation 07/23/17   Authorization Type MCR needs G code   SLP Start Time 1025  pt 10 min late   SLP Stop Time  1100   SLP Time Calculation (min) 35 min   Activity Tolerance Patient tolerated treatment well      Past Medical History:  Diagnosis Date  . REM sleep behavior disorder   . Sleep apnea     Past Surgical History:  Procedure Laterality Date  . APPENDECTOMY    . 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      Subjective Assessment - 05/24/17 1029    Subjective "I've always been kind of a low-spoken person, but I think it may be getting even lower, when I get tired especially."   Currently in Pain? No/denies            SLP Evaluation [facility] - 05/24/17 1029      SLP Visit Information   SLP Received On 05/24/17   Referring Provider Clyde Canterbury PA   Onset Date 11/2015   Medical Diagnosis Parkinson's Disease     General Information   HPI [name] [name] is a 76 y.o. male with hx RBD (REM Behavior Disorder). Pt was diagnosed with Parkinson's Disease in March 2017 at [facility].    Behavioral/Cognition alert, cooperative   Mobility Status ambulated to session     Prior Functional Status   Cognitive/Linguistic Baseline Within functional limits   Type of Home House    Lives With Spouse   Available Support Family;Available PRN/intermittently   Vocation Retired     [employer]   Overall Cognitive Status --  Will assess further in therapy     Auditory Comprehension   Overall Auditory  Comprehension Appears within functional limits for tasks assessed     Visual Recognition/Discrimination   Discrimination Not tested     Reading Comprehension   Reading Status Within funtional limits     Expression   Primary Mode of Expression Verbal     Verbal Expression   Overall Verbal Expression Appears within functional limits for tasks assessed     Written Expression   Dominant Hand Right   Written Expression Not tested     Oral Motor/Sensory Function   Overall Oral Motor/Sensory Function Appears within functional limits for tasks assessed   Labial ROM Reduced right   Overall Oral Motor/Sensory Function overall functional strength and ROM; mild labial tremor noted     Motor Speech   Overall Motor Speech Impaired   Respiration Impaired   Level of Impairment Phrase   Phonation Low vocal intensity   Resonance Within functional limits   Articulation Impaired   Level of Impairment Conversation   Intelligibility Intelligible  >95% in quiet environment   Motor Planning Witnin functional limits   Motor Speech Errors Not applicable   Effective Techniques Slow rate;Increased vocal intensity;Over-articulate   Phonation Impaired   Volume Soft   Pitch --  monotone     Standardized Assessments   Standardized Assessments  Other  Assessment   Other Assessment see clinical impressions                         SLP Education - 05/24/17 1316    Education provided Yes   Education Details loud /a/, HEP   Person(s) Educated Patient   Methods Explanation;Demonstration;Verbal cues;Handout   Comprehension Verbalized understanding          SLP Short Term Goals - 05/24/17 1324      SLP SHORT TERM GOAL #1   Title Pt will achieve average loud /a/ of average mid-upper 80sdB over 3 sessions   Time 4   Period Weeks   Status New     SLP SHORT TERM GOAL #2   Title Pt will use speech volume average low 70sdB when responding with sentences 18/20 over two sessions    Time 4   Period Weeks   Status New     SLP SHORT TERM GOAL #3   Title In 7 minutes simple conversation pt will achieve average speech volume of low 70s dB over two sessions   Time 4   Period Weeks   Status New     SLP SHORT TERM GOAL #4   Title pt will demo abdominal breathing at rest 75% of the time with rare min A   Time 4   Period Weeks   Status New     SLP SHORT TERM GOAL #5   Title pt will tell SLP 3 signs of aspiration pneumonia   Time 4   Period Weeks   Status New          SLP Long Term Goals - 05/24/17 1327      SLP LONG TERM GOAL #1   Title Pt will achieve average loud /a/ of average mid-upper 80sdB over total 6 sessions   Time 8   Period Weeks   Status New     SLP LONG TERM GOAL #2   Title pt will report lower frequency of people asking for repeats in conversation   Time 8   Period Weeks   Status New     SLP LONG TERM GOAL #3   Title Pt will use abdominal breathing in 5 minutes simple conversation 75% success over 2 sessions   Time 8   Period Weeks   Status New     SLP LONG TERM GOAL #4   Title Pt will maintain loudness average low 70s dB in 10 minutes simple conversation    Time 8   Period Weeks   Status New          Plan - 05/24/17 1314    Clinical Impression Statement Patient presents today 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 He reports his wife frequently requests repeats, "she complains she can't hear me," and states, "I sound monotone." He also reports occasional coughing/choking with liquids "more than I did a long time ago." Oral motor assessment revealed WFL lingual ROM and WFL lingual strength. Labial ROM was mildly reduced R>L and strength was [facility]. Velar ROM appeared [facility]. Measured when a sound level meter was placed 30 cm away from pt's mouth, 10 minutes of conversational speech was reduced today, at average  63.2dB (WNL= average 70-72dB) with range of 58-70 dB. Overall speech intelligibility for this listener in a quiet environment was not affected, at >95%. Production of sustained /a/ averaged 69.5dB (range of 66  to 75), 14.5 seconds avg. duration. In paragraph level reading task, pt's pitch range was 92.5-123.5 Hz, however pt primarily speaking in limited range ~103.8 Hz. With demonstration and mod-max verbal cues for loudness, pt stimulable for loud /a/ averaging 80 dB with pt reported effort level of 8/10 (10=maximal effort). In phrase level task pt was asked to use the same amount of effort as with loud /a/. Loudness average with this increased effort was 73.6dB (range of 70 to 78) with occasional mod A for loudness. Pt would benefit from skilled ST in order to improve speech intelligibility and pt's QOL. He may also benefit from swallowing evaluation; will assess further in therapy and add goals PRN.   Speech Therapy Frequency 2x / week   Duration --  8 weeks or 16 additional visits   Treatment/Interventions Aspiration precaution training;Environmental controls;Cueing hierarchy;SLP instruction and feedback;Functional tasks;Compensatory strategies;Patient/family education   Potential to Achieve Goals Good   SLP Home Exercise Plan reviewed and handout provided   Consulted and Agree with Plan of Care Patient      Patient will benefit from skilled therapeutic intervention in order to improve the following deficits and impairments:   Dysarthria and anarthria  Dysphagia, oropharyngeal phase      G-Codes - 06-18-2017 1332    Functional Assessment Tool Used skilled clinical judgment, NOMS   Functional Limitations Motor speech   Motor Speech Current Status [phone]) At least 20 percent but less than 40 percent impaired, limited or restricted   Motor Speech Goal Status (O2947) At least 1 percent but less than 20 percent impaired, limited or restricted      Problem List There are no active problems to  display for this patient.  Deneise Lever, [place], CCC-SLP Speech-Language Pathologist   Aliene Altes 06-18-17, 1:33 PM  [facility] [address] [place], [place], [address] Phone: [phone]   Fax:  [phone]  Name: [name] [name] MRN: [id number] Date of Birth: 1941/06/19

## 2017-05-24 NOTE — Therapy (Signed)
[facility] [address] [place] [address] [place], [place], [address] Phone: [phone]   Fax:  [phone]  Occupational Therapy Evaluation  Patient Details  Name: [name] [name] MRN: [id number] Date of Birth: Jan 24, 1941 Referring Provider: Dr. Cathlyn Parsons PA-c  Encounter Date: 05/24/2017      OT End of Session - 05/24/17 1312    Visit Number 1   Number of Visits 17   Date for OT Re-Evaluation 07/23/17   Authorization Type MCR / tricare secondary   Authorization - Visit Number 1   Authorization - Number of Visits 10   OT Start Time 1105   OT Stop Time 1145   OT Time Calculation (min) 40 min   Activity Tolerance Patient tolerated treatment well   Behavior During Therapy [facility] for tasks assessed/performed      Past Medical History:  Diagnosis Date  . REM sleep behavior disorder   . Sleep apnea     Past Surgical History:  Procedure Laterality Date  . APPENDECTOMY    . 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      Subjective Assessment - 05/24/17 1105    Subjective  Pt reports his MD has increased his sinemet to 3x day   Patient Stated Goals improve coordination for ADLs   Currently in Pain? No/denies           [facility] OT Assessment - 05/24/17 0001      Assessment   Diagnosis Parkinson's disease   Referring Provider Dr. Cathlyn Parsons PA-c   Onset Date --  March 2017   Prior Therapy OT     Precautions   Precautions Fall     Balance Screen   Has the patient fallen in the past 6 months No   Has the patient had a decrease in activity level because of a fear of falling?  No   Is the patient reluctant to leave their home because of a fear of falling?  No     Home  Environment   Family/patient expects to be discharged to: Private residence   Type of [place]   Lives With Spouse     Prior Function   Level of  [place] Retired   Leisure watch TV     ADL   Eating/Feeding Modified independent   Grooming Modified independent  increased time   [place] independent   [place] independent   Upper Body Dressing Increased time;Independent   Lower Body Dressing Increased time;Modified independent   Toilet Transfer Modified independent   Tub/Shower Transfer Modified independent     IADL   Shopping Takes care of all shopping needs independently   Light Housekeeping Performs light daily tasks such as dishwashing, bed making  has assist every other week,    Meal Prep Able to complete simple warm meal prep   Medication Management Is responsible for taking medication in correct dosages at correct time   Financial Management Manages financial matters independently (budgets, writes checks, pays rent, bills goes to bank), collects and keeps track of income     Mobility   Mobility Status Independent     Written Expression   Dominant Hand Right   Handwriting Increased time;100% legible  printing     Cognition   Overall Cognitive Status Within Functional Limits for tasks assessed     Observation/Other  Assessments   Other Surveys  Select   Simulated Eating Time (seconds) 15.48 secs   Donning Doffing Jacket Time (seconds) 17.57 secs   Donning Doffing Jacket Comments 3 button/ unbutton: 44.46 secs     Sensation   Light Touch Appears Intact     Coordination   Gross Motor Movements are Fluid and Coordinated No   Fine Motor Movements are Fluid and Coordinated No   9 Hole Peg Test Right;Left   Right 9 Hole Peg Test 51.22 secs   Left 9 Hole Peg Test 24.58 secs   Box and Blocks RUE 37 blocks, LUE 54 blocks   Tremors RUE resting and action tremor, LUE action tremor only     Tone   Assessment Location Right Upper Extremity;Left Upper Extremity     ROM / Strength   AROM / PROM / Strength AROM     AROM   Overall AROM  Within  functional limits for tasks performed   Overall AROM Comments RUE shoulder flexion 115 , -15 elbow extension, mild limitations in supination, wrist ext, , LUE sh. flex 125     RUE Tone   RUE Tone Moderate  rigidity     LUE Tone   LUE Tone Mild  rigidity                           OT Short Term Goals - 05/24/17 1313      OT SHORT TERM GOAL #1   Title Pt will be independent with PD-specific HEP   Time 4   Period Weeks   Status On-going   Target Date 06/23/17     OT SHORT TERM GOAL #2   Title Pt will improve R shoulder flex to at least 125 with -10* elbow extension for functional reaching (without cues)   Baseline 115 with -15 elbow ext   Time 4   Period Weeks   Status New     OT SHORT TERM GOAL #3   Title Pt will improve coordination for ADLs as shown by improving time on 9-hole peg test by at least 5sec with R hand.   Baseline 51.22 secs   Time 4   Period Weeks   Status New     OT SHORT TERM GOAL #4   Title Pt will improve functional reaching/coordination for ADLs as shown by improving score on box and blocks test by at least 5 with RUE.   Baseline 37 blocks   Time 4   Period Weeks   Status New     OT SHORT TERM GOAL #5   Title Pt will demonstrate improved ease with dressing as evidenced by decreasing PPT#4 to 14 secs or less.   Baseline 17.57 secs   Time 4   Period Weeks   Status New           OT Long Term Goals - 05/24/17 1316      OT LONG TERM GOAL #1   Title Pt will verbalize understanding of adaptive strategies to incr ease of ADLs/IADLs prn.   Time 8   Period Weeks   Status New   Target Date 07/23/17     OT LONG TERM GOAL #2   Title Pt will decrease 3 button/ unbutton time to 40 secs or less   Baseline 44.46 secs   Time 8   Period Weeks   Status New     OT LONG TERM GOAL #3   Title Pt will  improve coordination for ADLs as shown by improving time on 9-hole peg test by at least 10sec with R hand.   Baseline 51.22 secs     Time 8   Period Weeks   Status New     OT LONG TERM GOAL #4   Title Pt will verbalize understanding of ways to prevent future complications, and PD specific community resources.   Time 8   Period Weeks   Status New               Plan - 06-12-17 1337    Clinical Impression Statement Pt is a 76 y.o with diagnosis of Parkinson's disease who returns to occupational therapy with a decline in ADLS/IADLS. Pt demonstrates the following: decreased coordination, decreased balance, rigidity , tremor, and decreased ROM which limits pt's performance of daily activities. Pt can benefit from skilled occupational therapy to maximize safety and independence with ADLs/ IADLS.   Occupational Profile and client history currently impacting functional performance Pt is retired and caring for his wife who had a kidney transplant several years ago. PMH: REM sleep disorder, sleep apnea   Occupational performance deficits (Please refer to evaluation for details): ADL's;IADL's;Leisure;Social Participation   Rehab Potential Good   OT Frequency 2x / week   OT Duration 8 weeks   OT Treatment/Interventions Self-care/ADL training;Moist Heat;Fluidtherapy;DME and/or AE instruction;Splinting;Patient/family education;Balance training;Therapeutic exercises;Therapeutic activities;Therapeutic exercise;Cryotherapy;Iontophoresis;Neuromuscular education;Functional Mobility Training;Manual Therapy   Plan initiate coordinate HEP   Clinical Decision Making Limited treatment options, no task modification necessary   Consulted and Agree with Plan of Care Patient      Patient will benefit from skilled therapeutic intervention in order to improve the following deficits and impairments:  Abnormal gait, Decreased coordination, Decreased range of motion, Difficulty walking, Decreased endurance, Decreased safety awareness, Impaired tone, Decreased activity tolerance, Decreased knowledge of precautions, Decreased balance, Decreased  knowledge of use of DME, Pain, Impaired perceived functional ability, Decreased strength, Decreased mobility, Impaired UE functional use, Impaired flexibility  Visit Diagnosis: Other symptoms and signs involving the nervous system  Other lack of coordination  Other abnormalities of gait and mobility  Other symptoms and signs involving the musculoskeletal system  Abnormal posture  Unsteadiness on feet      G-Codes - 2017/06/12 1335    Functional Assessment Tool Used (Outpatient only) PPT#4 17.57 secs, 9 hole peg test RUE 51.22 secs, LUE 24.58 secs, Box/ blocks: RUE 37 blocks, LUE 54 blocks   Functional Limitation Self care   Self Care Current Status (E8315) At least 20 percent but less than 40 percent impaired, limited or restricted   Self Care Goal Status (V7616) At least 1 percent but less than 20 percent impaired, limited or restricted      Problem List There are no active problems to display for this patient.   Nashae Maudlin 2017-06-12, 1:47 PM Theone Murdoch, OTR/L [phone] Phone: [phone] 1:47 PM 06-12-2017 [facility] [address] [place], [place], [address] Phone: [phone]   Fax:  [phone]  Name: [name] [name] MRN: [id number] Date of Birth: 1940/11/19

## 2017-05-24 NOTE — Patient Instructions (Signed)
Home exercises, complete 2x day.  1) Say "ah" with your loud, good quality voice (use your abs). Aim for effort level 7 or 8.   5 times  2) Read aloud from your list of 10 phrases in your loud, good quality voice.   1.  2.   3.  4.   5.   6.  7.  8.  9.  10.   3) Additional reading tasks assigned by your therapist or read aloud (aim for at least 10 min per day). Remember to use your abdominal breathing and loud voice.

## 2017-05-26 DIAGNOSIS — M7751 Other enthesopathy of right foot: Secondary | ICD-10-CM | POA: Diagnosis not present

## 2017-05-26 DIAGNOSIS — Q6689 Other  specified congenital deformities of feet: Secondary | ICD-10-CM | POA: Diagnosis not present

## 2017-06-01 ENCOUNTER — Ambulatory Visit: Payer: Medicare Other | Admitting: Occupational Therapy

## 2017-06-01 ENCOUNTER — Ambulatory Visit: Payer: Medicare Other | Admitting: Speech Pathology

## 2017-06-01 DIAGNOSIS — R2681 Unsteadiness on feet: Secondary | ICD-10-CM

## 2017-06-01 DIAGNOSIS — R293 Abnormal posture: Secondary | ICD-10-CM | POA: Diagnosis not present

## 2017-06-01 DIAGNOSIS — R29898 Other symptoms and signs involving the musculoskeletal system: Secondary | ICD-10-CM | POA: Diagnosis not present

## 2017-06-01 DIAGNOSIS — R471 Dysarthria and anarthria: Secondary | ICD-10-CM

## 2017-06-01 DIAGNOSIS — R29818 Other symptoms and signs involving the nervous system: Secondary | ICD-10-CM | POA: Diagnosis not present

## 2017-06-01 DIAGNOSIS — R2689 Other abnormalities of gait and mobility: Secondary | ICD-10-CM

## 2017-06-01 DIAGNOSIS — R278 Other lack of coordination: Secondary | ICD-10-CM | POA: Diagnosis not present

## 2017-06-01 DIAGNOSIS — R1312 Dysphagia, oropharyngeal phase: Secondary | ICD-10-CM

## 2017-06-01 NOTE — Therapy (Signed)
[facility] [address] [place], [place], [address] Phone: [phone]   Fax:  [phone]  Speech Language Pathology Treatment  Patient Details  Name: [name] [name] MRN: [id number] Date of Birth: 30-Jul-1941 Referring Provider: Clyde Canterbury PA  Encounter Date: 06/01/2017      End of Session - 06/01/17 1518    Visit Number 2   Number of Visits 17   Date for SLP Re-Evaluation 07/23/17   Authorization Type MCR needs G code   SLP Start Time 1315   SLP Stop Time  1358   SLP Time Calculation (min) 43 min   Activity Tolerance Patient tolerated treatment well      Past Medical History:  Diagnosis Date  . REM sleep behavior disorder   . Sleep apnea     Past Surgical History:  Procedure Laterality Date  . APPENDECTOMY    . 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      Subjective Assessment - 06/01/17 1322    Subjective "Sometimes I swallow before my throat is ready"   Currently in Pain? No/denies               ADULT SLP TREATMENT - 06/01/17 1322      General Information   Behavior/Cognition Alert;Cooperative;Pleasant mood     Treatment Provided   Treatment provided Cognitive-Linquistic;Dysphagia     Dysphagia Treatment   Temperature Spikes Noted No   Respiratory Status Room air   Treatment Methods Skilled observation;Compensation strategy training;Patient/caregiver education   Patient observed directly with PO's Yes   Type of PO's observed Dysphagia 3 (soft);Thin liquids   Feeding Able to feed self   Liquids provided via Cup   Pharyngeal Phase Signs & Symptoms Delayed throat clear   Type of cueing Verbal   Amount of cueing Minimal   Other treatment/comments trained in basic swallow precautions     Pain Assessment   Pain Assessment No/denies pain     Cognitive-Linquistic Treatment   Treatment focused on Dysarthria   Skilled Treatment Loud /a/  to recalibrate loudness - average of 86dB with usual mod cues. Oral reading 5-10 word seneteces with average of 72dB and occasional min visual/verbal cues. Structured speech tasks with mild cognitive load - average of 68dB with usual min to mod cues for breath support and maintain volume.     Assessment / Recommendations / Plan   Plan Continue with current plan of care     Dysphagia Recommendations   Diet recommendations Regular;Thin liquid   Liquids provided via Cup   Medication Administration Whole meds with liquid   Compensations Small sips/bites;Slow rate;Effortful swallow   Postural Changes and/or Swallow Maneuvers Out of bed for meals;Seated upright 90 degrees     Progression Toward Goals   Progression toward goals Progressing toward goals          SLP Education - 06/01/17 1359    Education provided Yes   Education Details continue loud /a/; s/s of aspiration pna; swallow precautions   Person(s) Educated Patient   Methods Explanation;Demonstration;Verbal cues;Handout   Comprehension Verbalized understanding          SLP Short Term Goals - 06/01/17 1518      SLP SHORT TERM GOAL #1   Title Pt will achieve average loud /a/ of average mid-upper 80sdB over 3 sessions   Time 4   Period Weeks   Status On-going     SLP SHORT  TERM GOAL #2   Title Pt will use speech volume average low 70sdB when responding with sentences 18/20 over two sessions   Time 4   Period Weeks   Status On-going     SLP SHORT TERM GOAL #3   Title In 7 minutes simple conversation pt will achieve average speech volume of low 70s dB over two sessions   Time 4   Period Weeks   Status New     SLP SHORT TERM GOAL #4   Title pt will demo abdominal breathing at rest 75% of the time with rare min A   Time 4   Period Weeks   Status New     SLP SHORT TERM GOAL #5   Title pt will tell SLP 3 signs of aspiration pneumonia   Time 4   Period Weeks   Status On-going          SLP Long Term Goals -  06/01/17 1518      SLP LONG TERM GOAL #1   Title Pt will achieve average loud /a/ of average mid-upper 80sdB over total 6 sessions   Time 8   Period Weeks   Status On-going     SLP LONG TERM GOAL #2   Title pt will report lower frequency of people asking for repeats in conversation   Time 8   Period Weeks   Status On-going     SLP LONG TERM GOAL #3   Title Pt will use abdominal breathing in 5 minutes simple conversation 75% success over 2 sessions   Time 8   Period Weeks   Status On-going     SLP LONG TERM GOAL #4   Title Pt will maintain loudness average low 70s dB in 10 minutes simple conversation    Time 8   Period Weeks   Status On-going          Plan - 06/01/17 1401    Clinical Impression Statement Pt required usual min to mod A for breath support and volume in structured speech tasks and simple conversation. Volume continues to affect intellgibility. Trained pt in compensations for dysarthria and environmental compensations to improve intellgilbility. Continue skilled ST to maximize intellgibility for improved independence and QOL.   Speech Therapy Frequency 2x / week   Treatment/Interventions Aspiration precaution training;Environmental controls;Cueing hierarchy;SLP instruction and feedback;Functional tasks;Compensatory strategies;Patient/family education   Potential to Achieve Goals Good      Patient will benefit from skilled therapeutic intervention in order to improve the following deficits and impairments:   Dysarthria and anarthria  Dysphagia, oropharyngeal phase    Problem List There are no active problems to display for this patient.   [place], Annye Rusk MS, CCC-SLP 06/01/2017, 3:19 PM  [facility] [address] [place], [place], [address] Phone: [phone]   Fax:  [phone]   Name: TREYCE SPILLERS MRN: [id number] Date of Birth: 24-Mar-1941

## 2017-06-01 NOTE — Patient Instructions (Signed)
  PWR! Hands  With arms stretched out in front of you (elbows straight), perform the following: 1. PWR! Rock: Move wrists up and down BIG 2. PWR! Twist: Twist palms up and down BIG  Then, start with elbows bent and hands closed.  PWR! Step: Touch index finger to thumb while keeping other fingers straight. Flick fingers out BIG (thumb out/straighten fingers). Repeat with other fingers. (Step your thumb to each finger).  PWR! Hands: Push hands out BIG. Elbows straight, wrists up, fingers open and spread apart BIG. (Can also perform by pushing down on table, chair, knees. Push above head, out to the side, behind you, in front of you.)   ** Make each movement big and deliberate so that you feel the movement.  Perform at least 10 repetitions 1x/day, but perform PWR! hands throughout the day when you are having trouble using your hands (picking up/manipulating small objects, writing, eating, typing, sewing, buttoning, etc.).  Coordination Exercises  Perform the following exercises for 20 minutes 1 times per day. Perform with both hand(s). Perform using big movements.   Flipping Cards: Place deck of cards on the table. Flip cards over by opening your hand big to grasp and then turn your palm up big, opening hand fully to release.  Deal cards: Hold 1/2 or whole deck in your hand. Use thumb to push card off top of deck with one big push.  Rotate ball with fingertips: Pick up with fingers/thumb and move as much as you can with each turn/movement (clockwise and counter-clockwise).  Toss ball from one hand to the other: Toss big/high.  Deliberately open with toss and deliberately close hand after catch.  Toss ball in the air and catch with the same hand: Toss big/high.  Deliberately open with toss and deliberately close hand after catch.  Juggle 2 balls: Do not go fast. Pause after each toss.  Rotate 2 golf balls in your hand: Both directions.  Pick up 5-10 coins one at a time and  hold in palm. Then, move coins from palm to fingertips one at a time to stack.  Practice writing: Slow down, write big, and focus on forming each letter.  Practice typing.  Fasten nuts/bolts or put on bottle caps: Turn as much/as big as you can with each turn.  Move wrist.

## 2017-06-01 NOTE — Therapy (Signed)
[facility] [address] [place], [place], [address] Phone: [phone]   Fax:  [phone]  Occupational Therapy Treatment  Patient Details  Name: [name] [name] MRN: [id number] Date of Birth: June 23, 1941 Referring Provider: Dr. Cathlyn Parsons PA-c  Encounter Date: 06/01/2017      OT End of Session - 06/01/17 1407    Visit Number 2   Number of Visits 17   Date for OT Re-Evaluation 07/23/17   Authorization Type MCR / [employer] secondary   Authorization - Visit Number 2   Authorization - Number of Visits 10   OT Start Time 1408   OT Stop Time 1448   OT Time Calculation (min) 40 min   Activity Tolerance Patient tolerated treatment well   Behavior During Therapy [facility] for tasks assessed/performed      Past Medical History:  Diagnosis Date  . REM sleep behavior disorder   . Sleep apnea     Past Surgical History:  Procedure Laterality Date  . APPENDECTOMY    . 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      Subjective Assessment - 06/01/17 1408    Subjective  I'm having trouble with my dexterity   Patient Stated Goals improve coordination for ADLs   Currently in Pain? No/denies                              OT Education - 06/01/17 1416    Education provided Yes   Education Details Reviewed/re-issued PWR! hands (basic 4) and coordination HEP with focus on large amplitude movements--see pt instructions   Person(s) Educated Patient   Methods Explanation;Demonstration;Verbal cues;Handout   Comprehension Verbalized understanding;Returned demonstration;Verbal cues required  min cueing for incr movement amplitude          OT Short Term Goals - 05/24/17 1313      OT SHORT TERM GOAL #1   Title Pt will be independent with PD-specific HEP   Time 4   Period Weeks   Status On-going   Target Date 06/23/17     OT SHORT TERM GOAL #2   Title  Pt will improve R shoulder flex to at least 125 with -10* elbow extension for functional reaching (without cues)   Baseline 115 with -15 elbow ext   Time 4   Period Weeks   Status New     OT SHORT TERM GOAL #3   Title Pt will improve coordination for ADLs as shown by improving time on 9-hole peg test by at least 5sec with R hand.   Baseline 51.22 secs   Time 4   Period Weeks   Status New     OT SHORT TERM GOAL #4   Title Pt will improve functional reaching/coordination for ADLs as shown by improving score on box and blocks test by at least 5 with RUE.   Baseline 37 blocks   Time 4   Period Weeks   Status New     OT SHORT TERM GOAL #5   Title Pt will demonstrate improved ease with dressing as evidenced by decreasing PPT#4 to 14 secs or less.   Baseline 17.57 secs   Time 4   Period Weeks   Status New           OT Long Term Goals - 05/24/17 1316      OT LONG TERM GOAL #1  Title Pt will verbalize understanding of adaptive strategies to incr ease of ADLs/IADLs prn.   Time 8   Period Weeks   Status New   Target Date 07/23/17     OT LONG TERM GOAL #2   Title Pt will decrease 3 button/ unbutton time to 40 secs or less   Baseline 44.46 secs   Time 8   Period Weeks   Status New     OT LONG TERM GOAL #3   Title Pt will improve coordination for ADLs as shown by improving time on 9-hole peg test by at least 10sec with R hand.   Baseline 51.22 secs    Time 8   Period Weeks   Status New     OT LONG TERM GOAL #4   Title Pt will verbalize understanding of ways to prevent future complications, and PD specific community resources.   Time 8   Period Weeks   Status New               Plan - 06/01/17 1422    Clinical Impression Statement Pt responds well to cueing for large amplitude movements.  However, pt demo significant difficulty with R hand in-hand manipulation.     Rehab Potential Good   OT Frequency 2x / week   OT Duration 8 weeks   OT  Treatment/Interventions Self-care/ADL training;Moist Heat;Fluidtherapy;DME and/or AE instruction;Splinting;Patient/family education;Balance training;Therapeutic exercises;Therapeutic activities;Therapeutic exercise;Cryotherapy;Iontophoresis;Neuromuscular education;Functional Mobility Training;Manual Therapy   Plan PWR! moves (seated/quadruped)?, coordination with large amplitude   OT Home Exercise Plan Education provided:  PWR! hands (basic 4); coordination HEP   Consulted and Agree with Plan of Care Patient      Patient will benefit from skilled therapeutic intervention in order to improve the following deficits and impairments:  Abnormal gait, Decreased coordination, Decreased range of motion, Difficulty walking, Decreased endurance, Decreased safety awareness, Impaired tone, Decreased activity tolerance, Decreased knowledge of precautions, Decreased balance, Decreased knowledge of use of DME, Pain, Impaired perceived functional ability, Decreased strength, Decreased mobility, Impaired UE functional use, Impaired flexibility  Visit Diagnosis: Other symptoms and signs involving the nervous system  Other lack of coordination  Other abnormalities of gait and mobility  Other symptoms and signs involving the musculoskeletal system  Abnormal posture  Unsteadiness on feet    Problem List There are no active problems to display for this patient.   [facility] 06/01/2017, Ester Rink PM  [facility] [address] [place], [place], [address] Phone: [phone]   Fax:  [phone]  Name: [name] [name] MRN: [id number] Date of Birth: Nov 14, 1940   Vianne Bulls, OTR/L [facility] [address] [place], [place]  [address] [phone] phone [phone] 06/01/17 3:24 PM

## 2017-06-01 NOTE — Patient Instructions (Addendum)
   Limit distractions while eating - conversation, TV etc.  Small bites  Chew thoroughly  Single sips  Hard swallow with each bite/sip  Be aware of any throat clearing or coughing with meals or after you eat - journal   Try not to talk while eating  Be aware of any difficulty swallowing pills  Don't lay down for 30 minutes after eating  If your swallowing gets worse let your doctor or ST know and we can do a swallow x-ray (Modified Barium Swallow Study)   Signs of Aspiration Pneumonia   . Chest pain/tightness . Fever (can be low grade) . Cough  o With foul-smelling phlegm (sputum) o With sputum containing pus or blood o With greenish sputum . Fatigue  . Shortness of breath  . Wheezing   **IF YOU HAVE THESE SIGNS, CONTACT YOUR DOCTOR OR GO TO THE EMERGENCY DEPARTMENT OR URGENT CARE AS SOON AS POSSIBLE**      Get the persons attention before you speak  Use eye contact and face the person you are speaking to  Be in close proximity to the person you are speaking to  Turn down any noise in the environment such as the TV, walk away from loud appliances, air conditioners, fans, dish washers etc

## 2017-06-02 DIAGNOSIS — Z9079 Acquired absence of other genital organ(s): Secondary | ICD-10-CM | POA: Diagnosis not present

## 2017-06-03 ENCOUNTER — Ambulatory Visit: Payer: Medicare Other | Admitting: Speech Pathology

## 2017-06-03 ENCOUNTER — Ambulatory Visit: Payer: Medicare Other | Admitting: Occupational Therapy

## 2017-06-03 DIAGNOSIS — R29818 Other symptoms and signs involving the nervous system: Secondary | ICD-10-CM | POA: Diagnosis not present

## 2017-06-03 DIAGNOSIS — R29898 Other symptoms and signs involving the musculoskeletal system: Secondary | ICD-10-CM | POA: Diagnosis not present

## 2017-06-03 DIAGNOSIS — R278 Other lack of coordination: Secondary | ICD-10-CM | POA: Diagnosis not present

## 2017-06-03 DIAGNOSIS — R2681 Unsteadiness on feet: Secondary | ICD-10-CM | POA: Diagnosis not present

## 2017-06-03 DIAGNOSIS — R471 Dysarthria and anarthria: Secondary | ICD-10-CM

## 2017-06-03 DIAGNOSIS — R2689 Other abnormalities of gait and mobility: Secondary | ICD-10-CM | POA: Diagnosis not present

## 2017-06-03 DIAGNOSIS — R293 Abnormal posture: Secondary | ICD-10-CM | POA: Diagnosis not present

## 2017-06-03 NOTE — Therapy (Signed)
[facility] [address] [place], [place], [address] Phone: [phone]   Fax:  [phone]  Speech Language Pathology Treatment  Patient Details  Name: [name] [name] MRN: [id number] Date of Birth: [birth year] Referring Provider: Clyde Canterbury PA  Encounter Date: 06/03/2017      End of Session - 06/03/17 1235    Visit Number 3   Number of Visits 17   Date for SLP Re-Evaluation 07/23/17   SLP Start Time 0846   SLP Stop Time  0925   SLP Time Calculation (min) 39 min   Activity Tolerance Patient tolerated treatment well      Past Medical History:  Diagnosis Date  . REM sleep behavior disorder   . Sleep apnea     Past Surgical History:  Procedure Laterality Date  . APPENDECTOMY    . 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      Subjective Assessment - 06/03/17 0851    Subjective "I had to go to [place] so I got some practice in the car"   Currently in Pain? 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 Oral reading 10+ word sentences, with rare min A for breath support averaged 75dB. Structured speech tasks average of 71dB on low cognitive load task. Pt required occasional min to mod A to average 68dB with higher cognitive task generating sentneces with multiple meaning.      Assessment / Recommendations / Plan   Plan Continue with current plan of care     Progression Toward Goals   Progression toward goals Progressing  toward goals          SLP Education - 06/03/17 0925    Education provided Yes   Education Details Conitnue loud /a/, breath support for speech   Person(s) Educated Patient   Methods Explanation;Demonstration;Handout   Comprehension Verbalized understanding;Returned demonstration;Verbal cues required          SLP Short Term Goals - 06/03/17 1234      SLP SHORT TERM GOAL #1   Title Pt will achieve average loud /a/ of average mid-upper 80sdB over 3 sessions   Time 4   Period Weeks   Status On-going     SLP SHORT TERM GOAL #2   Title Pt will use speech volume average low 70sdB when responding with sentences 18/20 over two sessions   Time 4   Period Weeks   Status On-going     SLP SHORT TERM GOAL #3   Title In 7 minutes simple conversation pt will achieve average speech volume of low 70s dB over two sessions   Time 4   Period Weeks   Status New     SLP SHORT TERM GOAL #4   Title pt will demo abdominal breathing at rest 75% of the time with rare min A   Time 4   Period Weeks   Status New     SLP SHORT TERM GOAL #5  Title pt will tell SLP 3 signs of aspiration pneumonia   Time 4   Period Weeks   Status On-going          SLP Long Term Goals - 06/03/17 1234      SLP LONG TERM GOAL #1   Title Pt will achieve average loud /a/ of average mid-upper 80sdB over total 6 sessions   Time 8   Period Weeks   Status On-going     SLP LONG TERM GOAL #2   Title pt will report lower frequency of people asking for repeats in conversation   Time 8   Period Weeks   Status On-going     SLP LONG TERM GOAL #3   Title Pt will use abdominal breathing in 5 minutes simple conversation 75% success over 2 sessions   Time 8   Period Weeks   Status On-going     SLP LONG TERM GOAL #4   Title Pt will maintain loudness average low 70s dB in 10 minutes simple conversation    Time 8   Period Weeks   Status On-going          Plan - 06/03/17 1233    Clinical Impression  Statement Pt required usual min to mod A for breath support and volume in structured speech tasks and simple conversation. Volume continues to affect intellgibility. Trained pt in compensations for dysarthria and environmental compensations to improve intellgilbility. Continue skilled ST to maximize intellgibility for improved independence and QOL.   Speech Therapy Frequency 2x / week   Treatment/Interventions Aspiration precaution training;Environmental controls;Cueing hierarchy;SLP instruction and feedback;Functional tasks;Compensatory strategies;Patient/family education   Potential to Achieve Goals Good   SLP Home Exercise Plan reviewed and handout provided   Consulted and Agree with Plan of Care Patient      Patient will benefit from skilled therapeutic intervention in order to improve the following deficits and impairments:   Dysarthria and anarthria    Problem List There are no active problems to display for this patient.   [name] [name], [name] Rusk MS, CCC-SLP 06/03/2017, 12:36 PM  [facility] [address] [place], [place], [address] Phone: [phone]   Fax:  [phone]   Name: [name] [name] MRN: [id number] Date of Birth: 26-May-1941

## 2017-06-03 NOTE — Therapy (Signed)
[facility] [address] [place], [place], [address] Phone: [phone]   Fax:  [phone]  Occupational Therapy Treatment  Patient Details  Name: JALANI ROMINGER MRN: [id number] Date of Birth: [birth year] Referring Provider: Dr. Cathlyn Parsons PA-c  Encounter Date: 06/03/2017      OT End of Session - 06/03/17 0834    Visit Number 3   Number of Visits 17   Date for OT Re-Evaluation 07/23/17   Authorization Type MCR / [employer] secondary   Authorization - Visit Number 3   Authorization - Number of Visits 10   OT Start Time 0805   OT Stop Time 0845   OT Time Calculation (min) 40 min   Activity Tolerance Patient tolerated treatment well   Behavior During Therapy [facility] for tasks assessed/performed      Past Medical History:  Diagnosis Date  . REM sleep behavior disorder   . Sleep apnea     Past Surgical History:  Procedure Laterality Date  . APPENDECTOMY    . 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      Subjective Assessment - 06/03/17 0806    Patient Stated Goals improve coordination for ADLs   Currently in Pain? No/denies                              OT Education - 06/03/17 1239    Education provided Yes   Education Details coordination HEP review, quadraped PWR!, standing PWR! basic  4   Person(s) Educated Patient   Methods Explanation;Demonstration;Verbal cues;Handout   Comprehension Verbalized understanding;Returned demonstration;Verbal cues required          OT Short Term Goals - 05/24/17 1313      OT SHORT TERM GOAL #1   Title Pt will be independent with PD-specific HEP   Time 4   Period Weeks   Status On-going   Target Date 06/23/17     OT SHORT TERM GOAL #2   Title Pt will improve R shoulder flex to at least 125 with -10* elbow extension for functional reaching (without cues)   Baseline 115 with -15 elbow ext   Time 4   Period Weeks   Status New     OT SHORT TERM GOAL #3   Title Pt will improve coordination for ADLs as shown by improving time on 9-hole peg test by at least 5sec with R hand.   Baseline 51.22 secs   Time 4   Period Weeks   Status New     OT SHORT TERM GOAL #4   Title Pt will improve functional reaching/coordination for ADLs as shown by improving score on box and blocks test by at least 5 with RUE.   Baseline 37 blocks   Time 4   Period Weeks   Status New     OT SHORT TERM GOAL #5   Title Pt will demonstrate improved ease with dressing as evidenced by decreasing PPT#4 to 14 secs or less.   Baseline 17.57 secs   Time 4   Period Weeks   Status New           OT Long Term Goals - 05/24/17 1316      OT LONG TERM GOAL #1   Title Pt will verbalize understanding of adaptive strategies to incr ease of ADLs/IADLs prn.   Time 8   Period Weeks  Status New   Target Date 07/23/17     OT LONG TERM GOAL #2   Title Pt will decrease 3 button/ unbutton time to 40 secs or less   Baseline 44.46 secs   Time 8   Period Weeks   Status New     OT LONG TERM GOAL #3   Title Pt will improve coordination for ADLs as shown by improving time on 9-hole peg test by at least 10sec with R hand.   Baseline 51.22 secs    Time 8   Period Weeks   Status New     OT LONG TERM GOAL #4   Title Pt will verbalize understanding of ways to prevent future complications, and PD specific community resources.   Time 8   Period Weeks   Status New               Plan - 06/03/17 8756    Clinical Impression Statement Pt is progressing towards goals . Pt demonstrates understanding of coordination and quadraped PWR! exercises.   Rehab Potential Good   OT Frequency 2x / week   OT Duration 8 weeks   OT Treatment/Interventions Self-care/ADL training;Moist Heat;Fluidtherapy;DME and/or AE instruction;Splinting;Patient/family education;Balance training;Therapeutic exercises;Therapeutic  activities;Therapeutic exercise;Cryotherapy;Iontophoresis;Neuromuscular education;Functional Mobility Training;Manual Therapy   Plan review standing PWR! ADL strategies   OT Home Exercise Plan Education provided:  PWR! hands (basic 4); coordination HEP, PWR! basic 4 standing   Consulted and Agree with Plan of Care Patient      Patient will benefit from skilled therapeutic intervention in order to improve the following deficits and impairments:  Abnormal gait, Decreased coordination, Decreased range of motion, Difficulty walking, Decreased endurance, Decreased safety awareness, Impaired tone, Decreased activity tolerance, Decreased knowledge of precautions, Decreased balance, Decreased knowledge of use of DME, Pain, Impaired perceived functional ability, Decreased strength, Decreased mobility, Impaired UE functional use, Impaired flexibility  Visit Diagnosis: Other symptoms and signs involving the nervous system  Other lack of coordination  Other symptoms and signs involving the musculoskeletal system    Problem List There are no active problems to display for this patient.   RINE,KATHRYN 06/03/2017, 12:42 PM  [facility] [address] [place], [place], [address] Phone: [phone]   Fax:  [phone]  Name: ALYAS CREARY MRN: [id number] Date of Birth: 1940-10-06

## 2017-06-03 NOTE — Patient Instructions (Signed)
  Continue loud AH! 5x twice a day, followed by loud speech practice  When you are in your car you can practice loudly things you have memorized such as prayers, pledges, verses, poems/rhymes etc  Singing loudly can help with volume and maintaining pitch/prosody  Continue to be aware of taking a big breath to generate good volume  If you don't feel like you are taking too loud, you are likely not talking loud enough

## 2017-06-08 ENCOUNTER — Ambulatory Visit: Payer: Medicare Other | Admitting: Occupational Therapy

## 2017-06-08 ENCOUNTER — Ambulatory Visit: Payer: Medicare Other | Attending: Internal Medicine

## 2017-06-08 DIAGNOSIS — R29898 Other symptoms and signs involving the musculoskeletal system: Secondary | ICD-10-CM | POA: Insufficient documentation

## 2017-06-08 DIAGNOSIS — R2689 Other abnormalities of gait and mobility: Secondary | ICD-10-CM

## 2017-06-08 DIAGNOSIS — R293 Abnormal posture: Secondary | ICD-10-CM | POA: Insufficient documentation

## 2017-06-08 DIAGNOSIS — R2681 Unsteadiness on feet: Secondary | ICD-10-CM | POA: Insufficient documentation

## 2017-06-08 DIAGNOSIS — R278 Other lack of coordination: Secondary | ICD-10-CM | POA: Diagnosis not present

## 2017-06-08 DIAGNOSIS — R29818 Other symptoms and signs involving the nervous system: Secondary | ICD-10-CM | POA: Insufficient documentation

## 2017-06-08 DIAGNOSIS — R1312 Dysphagia, oropharyngeal phase: Secondary | ICD-10-CM | POA: Diagnosis not present

## 2017-06-08 DIAGNOSIS — R471 Dysarthria and anarthria: Secondary | ICD-10-CM

## 2017-06-08 NOTE — Therapy (Signed)
[facility] [address] [place], [place], [address] Phone: [phone]   Fax:  [phone]  Speech Language Pathology Treatment  Patient Details  Name: [name] [name] MRN: [id number] Date of Birth: 1940-12-17 Referring Provider: Clyde Canterbury PA  Encounter Date: 06/08/2017      End of Session - 06/08/17 0934    Visit Number 4   Number of Visits 17   Date for SLP Re-Evaluation 07/23/17   SLP Start Time 0846   SLP Stop Time  0930   SLP Time Calculation (min) 44 min   Activity Tolerance Patient tolerated treatment well      Past Medical History:  Diagnosis Date  . REM sleep behavior disorder   . Sleep apnea     Past Surgical History:  Procedure Laterality Date  . APPENDECTOMY    . 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      Subjective Assessment - 06/08/17 0856    Subjective "Yes, and that's a challenge for me." (pt, re: maintaining WNL conversational loudness)   Currently in Pain? 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 Oral reading 10+ word sentences, with rare min A for breath support averaged 75dB. Structured speech tasks average of 71dB on low cognitive load task. Pt required occasional min to mod A to average 68dB with higher cognitive task generating sentneces with multiple meaning.      Assessment / Recommendations / Plan   Plan Continue with current plan of care     Progression Toward Goals   Progression toward goals Progressing toward goals             SLP Short Term Goals - 06/08/17 0936      SLP SHORT TERM GOAL #1   Title Pt will achieve average loud /a/ of average mid-upper 80sdB over 3 sessions   Baseline 06-08-17   Time 3   Period Weeks   Status On-going     SLP SHORT TERM GOAL #2   Title Pt will use speech volume average low 70sdB when responding with sentences 18/20 over two sessions   Baseline 06-08-17   Time 3   Period Weeks   Status On-going     SLP SHORT TERM GOAL #3   Title In 7 minutes simple conversation pt will achieve average speech volume of low 70s dB over two sessions   Time 3   Period Weeks   Status New     SLP SHORT TERM GOAL #4   Title pt will demo abdominal breathing at rest 75% of the time with rare min A   Baseline 06-08-17   Time 3   Period Weeks   Status New     SLP SHORT TERM GOAL #5   Title pt will tell SLP 3 signs of aspiration pneumonia   Time 3   Period Weeks   Status On-going          SLP Long Term Goals - 06/08/17 0932      SLP LONG  TERM GOAL #1   Title Pt will achieve average loud /a/ of average mid-upper 80sdB over total 6 sessions   Time 7   Period Weeks   Status On-going     SLP LONG TERM GOAL #2   Title pt will report lower frequency of people asking for repeats in conversation   Time 7   Period Weeks   Status On-going     SLP LONG TERM GOAL #3   Title Pt will use abdominal breathing in 5 minutes simple conversation 75% success over 2 sessions   Time 7   Period Weeks   Status On-going     SLP LONG TERM GOAL #4   Title Pt will maintain loudness average low 70s dB in 10 minutes simple conversation    Time 7   Period Weeks   Status On-going          Plan - 06/08/17 0935    Clinical Impression Statement Pt presented today with suboptimal loudness upon entering ST room. however after loud /a/ maintained WNL loudness for remainder of session, some min cues (SBA) for breath support during session. Continue skilled ST to maximize intellgibility for improved  independence and QOL.   Speech Therapy Frequency 2x / week   Treatment/Interventions Aspiration precaution training;Environmental controls;Cueing hierarchy;SLP instruction and feedback;Functional tasks;Compensatory strategies;Patient/family education   Potential to Achieve Goals Good   SLP Home Exercise Plan reviewed and handout provided   Consulted and Agree with Plan of Care Patient      Patient will benefit from skilled therapeutic intervention in order to improve the following deficits and impairments:   Dysarthria and anarthria  Dysphagia, oropharyngeal phase    Problem List There are no active problems to display for this patient.   [facility], [place]  06/08/2017, 9:37 AM  [facility] [address] [place], [place], [address] Phone: [phone]   Fax:  [phone]   Name: [name] [name] MRN: [id number] Date of Birth: Dec 05, 1940

## 2017-06-08 NOTE — Therapy (Signed)
[facility] [address] [place], [place], [address] Phone: [phone]   Fax:  [phone]  Occupational Therapy Treatment  Patient Details  Name: [name] [name] MRN: [id number] Date of Birth: 07/22/1941 Referring Provider: Dr. Cathlyn Parsons PA-c  Encounter Date: 06/08/2017      OT End of Session - 06/08/17 0814    Visit Number 4   Number of Visits 17   Date for OT Re-Evaluation 07/23/17   Authorization Type MCR / tric[employer] secondary   Authorization - Visit Number 4   Authorization - Number of Visits 10   OT Start Time 0809   OT Stop Time 0845   OT Time Calculation (min) 36 min   Activity Tolerance Patient tolerated treatment well   Behavior During Therapy [facility] for tasks assessed/performed      Past Medical History:  Diagnosis Date  . REM sleep behavior disorder   . Sleep apnea     Past Surgical History:  Procedure Laterality Date  . APPENDECTOMY    . 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      Subjective Assessment - 06/08/17 0808    Subjective  A little sore   Patient Stated Goals improve coordination for ADLs   Currently in Pain? No/denies  soreness from yardwork          Treatment: Reviewed PWR! Basic 4 in standing 10-20 reps each, min v.c for larger amplitude movements. PWR! Hands basic 4 5-10 reps each, min v.c Fastening buttons with adapted strategy improved performance. Dynamic step and reach stepping over target to emphasize larger amplitude movments, while copying small peg design for cognitive component, mod difficutlty/ v.c when stepping with RUE.                      OT Short Term Goals - 05/24/17 1313      OT SHORT TERM GOAL #1   Title Pt will be independent with PD-specific HEP   Time 4   Period Weeks   Status On-going   Target Date 06/23/17     OT SHORT TERM GOAL #2   Title Pt will improve R shoulder  flex to at least 125 with -10* elbow extension for functional reaching (without cues)   Baseline 115 with -15 elbow ext   Time 4   Period Weeks   Status New     OT SHORT TERM GOAL #3   Title Pt will improve coordination for ADLs as shown by improving time on 9-hole peg test by at least 5sec with R hand.   Baseline 51.22 secs   Time 4   Period Weeks   Status New     OT SHORT TERM GOAL #4   Title Pt will improve functional reaching/coordination for ADLs as shown by improving score on box and blocks test by at least 5 with RUE.   Baseline 37 blocks   Time 4   Period Weeks   Status New     OT SHORT TERM GOAL #5   Title Pt will demonstrate improved ease with dressing as evidenced by decreasing PPT#4 to 14 secs or less.   Baseline 17.57 secs   Time 4   Period Weeks   Status New           OT Long Term Goals - 05/24/17 1316      OT LONG TERM GOAL #1   Title Pt will  verbalize understanding of adaptive strategies to incr ease of ADLs/IADLs prn.   Time 8   Period Weeks   Status New   Target Date 07/23/17     OT LONG TERM GOAL #2   Title Pt will decrease 3 button/ unbutton time to 40 secs or less   Baseline 44.46 secs   Time 8   Period Weeks   Status New     OT LONG TERM GOAL #3   Title Pt will improve coordination for ADLs as shown by improving time on 9-hole peg test by at least 10sec with R hand.   Baseline 51.22 secs    Time 8   Period Weeks   Status New     OT LONG TERM GOAL #4   Title Pt will verbalize understanding of ways to prevent future complications, and PD specific community resources.   Time 8   Period Weeks   Status New               Plan - 06/08/17 1000    Clinical Impression Statement Pt is progressing towards goals. He can benefit from continued dynamic stepping during functional reaching for reinforcement of larger amplitude movments. Pt continues to drag his right foot at times.   Rehab Potential Good   OT Duration 8 weeks   OT  Treatment/Interventions Self-care/ADL training;Moist Heat;Fluidtherapy;DME and/or AE instruction;Splinting;Patient/family education;Balance training;Therapeutic exercises;Therapeutic activities;Therapeutic exercise;Cryotherapy;Iontophoresis;Neuromuscular education;Functional Mobility Training;Manual Therapy   Plan continue dynamic stpping with big movements, ADL strategies   OT Home Exercise Plan Education provided:  PWR! hands (basic 4); coordination HEP, PWR! basic 4 standing   Consulted and Agree with Plan of Care Patient      Patient will benefit from skilled therapeutic intervention in order to improve the following deficits and impairments:  Abnormal gait, Decreased coordination, Decreased range of motion, Difficulty walking, Decreased endurance, Decreased safety awareness, Impaired tone, Decreased activity tolerance, Decreased knowledge of precautions, Decreased balance, Decreased knowledge of use of DME, Pain, Impaired perceived functional ability, Decreased strength, Decreased mobility, Impaired UE functional use, Impaired flexibility  Visit Diagnosis: Other symptoms and signs involving the nervous system  Other lack of coordination  Other symptoms and signs involving the musculoskeletal system  Other abnormalities of gait and mobility  Abnormal posture    Problem List There are no active problems to display for this patient.   RINE,KATHRYN 06/08/2017, 10:03 AM  [facility] [address] [place], [place], [address] Phone: [phone]   Fax:  [phone]  Name: [name] [name] MRN: [id number] Date of Birth: 1940/10/19

## 2017-06-08 NOTE — Patient Instructions (Signed)
  Please complete the assigned speech therapy homework prior to your next session and return it to the speech therapist at your next visit.  

## 2017-06-09 ENCOUNTER — Ambulatory Visit: Payer: Medicare Other | Admitting: Speech Pathology

## 2017-06-09 ENCOUNTER — Ambulatory Visit: Payer: Medicare Other | Admitting: Occupational Therapy

## 2017-06-09 DIAGNOSIS — R471 Dysarthria and anarthria: Secondary | ICD-10-CM

## 2017-06-09 DIAGNOSIS — R29898 Other symptoms and signs involving the musculoskeletal system: Secondary | ICD-10-CM | POA: Diagnosis not present

## 2017-06-09 DIAGNOSIS — R293 Abnormal posture: Secondary | ICD-10-CM | POA: Diagnosis not present

## 2017-06-09 DIAGNOSIS — R29818 Other symptoms and signs involving the nervous system: Secondary | ICD-10-CM | POA: Diagnosis not present

## 2017-06-09 DIAGNOSIS — R2689 Other abnormalities of gait and mobility: Secondary | ICD-10-CM

## 2017-06-09 DIAGNOSIS — R1312 Dysphagia, oropharyngeal phase: Secondary | ICD-10-CM | POA: Diagnosis not present

## 2017-06-09 DIAGNOSIS — R278 Other lack of coordination: Secondary | ICD-10-CM

## 2017-06-09 NOTE — Therapy (Signed)
[facility] [address] [place], [place], [address] Phone: [phone]   Fax:  [phone]  Speech Language Pathology Treatment  Patient Details  Name: [name] [name] MRN: [id number] Date of Birth: Jan 13, 1941 Referring Provider: Clyde Canterbury PA  Encounter Date: 06/09/2017      End of Session - 06/09/17 1440    Visit Number 5   Number of Visits 17   Date for SLP Re-Evaluation 07/23/17   Authorization Type MCR needs G code   SLP Start Time 1400   SLP Stop Time  1440   SLP Time Calculation (min) 40 min   Activity Tolerance Patient tolerated treatment well      Past Medical History:  Diagnosis Date  . REM sleep behavior disorder   . Sleep apnea     Past Surgical History:  Procedure Laterality Date  . APPENDECTOMY    . 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      Subjective Assessment - 06/09/17 1404    Subjective "I didn't sleep well last night - I've had a little bit of time to practice"   Currently in Pain? 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 Simple to mildly complex conversation over 20 minutes with various topics, pt required occasional to usual min verbal and visual cues for breath support and volume as volume decreases throughout conversation. Average of 71dB.      Assessment / Recommendations / Plan   Plan Continue with current plan of  care     Progression Toward Goals   Progression toward goals Progressing toward goals            SLP Short Term Goals - 06/09/17 1439      SLP SHORT TERM GOAL #1   Title Pt will achieve average loud /a/ of average mid-upper 80sdB over 3 sessions   Baseline 06-08-17, 06-09-17   Time 3   Period Weeks   Status On-going     SLP SHORT TERM GOAL #2   Title Pt will use speech volume average low 70sdB when responding with sentences 18/20 over two sessions   Baseline 06-08-17, 06-09-17   Time 3   Period Weeks   Status On-going     SLP SHORT TERM GOAL #3   Title In 7 minutes simple conversation pt will achieve average speech volume of low 70s dB over two sessions   Baseline 06-09-17   Time 3   Period Weeks   Status New     SLP SHORT TERM GOAL #4   Title pt will demo abdominal breathing at rest 75% of the time with rare min A   Baseline 06-08-17   Time 3   Period Weeks   Status New     SLP SHORT TERM GOAL #5   Title pt will tell SLP 3 signs of aspiration pneumonia   Time 3  Period Weeks   Status On-going          SLP Long Term Goals - 06/09/17 1440      SLP LONG TERM GOAL #1   Title Pt will achieve average loud /a/ of average mid-upper 80sdB over total 6 sessions   Time 7   Period Weeks   Status On-going     SLP LONG TERM GOAL #2   Title pt will report lower frequency of people asking for repeats in conversation   Time 7   Period Weeks   Status On-going     SLP LONG TERM GOAL #3   Title Pt will use abdominal breathing in 5 minutes simple conversation 75% success over 2 sessions   Time 7   Period Weeks   Status On-going     SLP LONG TERM GOAL #4   Title Pt will maintain loudness average low 70s dB in 10 minutes simple conversation    Time 7   Period Weeks   Status On-going          Plan - 06/09/17 1438    Clinical Impression Statement Pt reports his spouse has notice improvement in his volume and intellgiblity. Pt required occasional to usual min A to  maintain volume and breath support over 20 minutes conversing on various topics. Volume decay persists at conversation level. Continue skilled ST to maximize intellgilbity for improve indpendence and QOL.    Speech Therapy Frequency 2x / week   Treatment/Interventions Aspiration precaution training;Environmental controls;Cueing hierarchy;SLP instruction and feedback;Functional tasks;Compensatory strategies;Patient/family education   Potential to Achieve Goals Good      Patient will benefit from skilled therapeutic intervention in order to improve the following deficits and impairments:   Dysarthria and anarthria    Problem List There are no active problems to display for this patient.   Ashey Tramontana, Annye Rusk MS, CCC-SLP 06/09/2017, 2:40 PM  [facility] [address] [place], [place], [address] Phone: [phone]   Fax:  [phone]   Name: [name] [name] MRN: [id number] Date of Birth: 1941/05/23

## 2017-06-09 NOTE — Therapy (Signed)
[facility] [address] [place], [place], [address] Phone: [phone]   Fax:  [phone]  Occupational Therapy Treatment  Patient Details  Name: [name] [name] MRN: [id number] Date of Birth: Dec 19, 1940 Referring Provider: Dr. Cathlyn Parsons PA-c  Encounter Date: 06/09/2017      OT End of Session - 06/09/17 1638    Visit Number 5   Number of Visits 17   Date for OT Re-Evaluation 07/23/17   Authorization Type MCR / tricare secondary   Authorization - Visit Number 5   Authorization - Number of Visits 10   OT Start Time 7673   OT Stop Time 1615   OT Time Calculation (min) 40 min   Activity Tolerance Patient tolerated treatment well   Behavior During Therapy [facility] for tasks assessed/performed      Past Medical History:  Diagnosis Date  . REM sleep behavior disorder   . Sleep apnea     Past Surgical History:  Procedure Laterality Date  . APPENDECTOMY    . 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      Subjective Assessment - 06/09/17 1637    Patient Stated Goals improve coordination for ADLs   Currently in Pain? No/denies             Treatment: Dynamic step and reach over target to promote larger steps, min-mod v.c for larger amplitude movements/ steps Multi-directional stepping to follow written directions and step to particular targets, min difficulty/ v.c Placing grooved pegs into pegboard, using PWR! Step with LUE min-mod difficulty then placing using index finger and thumb with RUE, mod difficulty                 OT Education - 06/09/17 1637    Education provided Yes   Education Details reveiwed PWR! seated, basic 4, quadraped PWR! rock   [employer]) Educated Patient   Methods Explanation;Demonstration;Verbal cues;Handout   Comprehension Verbalized understanding;Returned demonstration;Verbal cues required          OT Short  Term Goals - 05/24/17 1313      OT SHORT TERM GOAL #1   Title Pt will be independent with PD-specific HEP   Time 4   Period Weeks   Status On-going   Target Date 06/23/17     OT SHORT TERM GOAL #2   Title Pt will improve R shoulder flex to at least 125 with -10* elbow extension for functional reaching (without cues)   Baseline 115 with -15 elbow ext   Time 4   Period Weeks   Status New     OT SHORT TERM GOAL #3   Title Pt will improve coordination for ADLs as shown by improving time on 9-hole peg test by at least 5sec with R hand.   Baseline 51.22 secs   Time 4   Period Weeks   Status New     OT SHORT TERM GOAL #4   Title Pt will improve functional reaching/coordination for ADLs as shown by improving score on box and blocks test by at least 5 with RUE.   Baseline 37 blocks   Time 4   Period Weeks   Status New     OT SHORT TERM GOAL #5   Title Pt will demonstrate improved ease with dressing as evidenced by decreasing PPT#4 to 14 secs or less.   Baseline 17.57 secs   Time 4   Period Weeks   Status  New           OT Long Term Goals - 05/24/17 1316      OT LONG TERM GOAL #1   Title Pt will verbalize understanding of adaptive strategies to incr ease of ADLs/IADLs prn.   Time 8   Period Weeks   Status New   Target Date 07/23/17     OT LONG TERM GOAL #2   Title Pt will decrease 3 button/ unbutton time to 40 secs or less   Baseline 44.46 secs   Time 8   Period Weeks   Status New     OT LONG TERM GOAL #3   Title Pt will improve coordination for ADLs as shown by improving time on 9-hole peg test by at least 10sec with R hand.   Baseline 51.22 secs    Time 8   Period Weeks   Status New     OT LONG TERM GOAL #4   Title Pt will verbalize understanding of ways to prevent future complications, and PD specific community resources.   Time 8   Period Weeks   Status New               Plan - 06/09/17 1639    Clinical Impression Statement Pt is progressing  towards goals. He benefits from repetition/ review of larger amplitude movements during functional activity.   Rehab Potential Good   OT Frequency 2x / week   OT Duration 8 weeks   Plan practice donning/ doffing jacket, functional overhead reaching   [place] Education provided:  PWR! hands (basic 4); coordination HEP, PWR! basic 4 standing   Consulted and Agree with Plan of Care Patient      Patient will benefit from skilled therapeutic intervention in order to improve the following deficits and impairments:  Abnormal gait, Decreased coordination, Decreased range of motion, Difficulty walking, Decreased endurance, Decreased safety awareness, Impaired tone, Decreased activity tolerance, Decreased knowledge of precautions, Decreased balance, Decreased knowledge of use of DME, Pain, Impaired perceived functional ability, Decreased strength, Decreased mobility, Impaired UE functional use, Impaired flexibility  Visit Diagnosis: Other symptoms and signs involving the nervous system  Other lack of coordination  Other symptoms and signs involving the musculoskeletal system  Other abnormalities of gait and mobility  Abnormal posture    Problem List There are no active problems to display for this patient.   RINE,KATHRYN 06/09/2017, 4:52 PM  [facility] [address] [place], [place], [address] Phone: [phone]   Fax:  [phone]  Name: [name] [name] MRN: [id number] Date of Birth: [birth year]

## 2017-06-16 ENCOUNTER — Ambulatory Visit: Payer: Medicare Other | Admitting: Speech Pathology

## 2017-06-16 ENCOUNTER — Ambulatory Visit: Payer: Medicare Other | Admitting: Occupational Therapy

## 2017-06-16 DIAGNOSIS — R29898 Other symptoms and signs involving the musculoskeletal system: Secondary | ICD-10-CM

## 2017-06-16 DIAGNOSIS — R29818 Other symptoms and signs involving the nervous system: Secondary | ICD-10-CM

## 2017-06-16 DIAGNOSIS — R2689 Other abnormalities of gait and mobility: Secondary | ICD-10-CM | POA: Diagnosis not present

## 2017-06-16 DIAGNOSIS — R471 Dysarthria and anarthria: Secondary | ICD-10-CM | POA: Diagnosis not present

## 2017-06-16 DIAGNOSIS — R293 Abnormal posture: Secondary | ICD-10-CM | POA: Diagnosis not present

## 2017-06-16 DIAGNOSIS — R1312 Dysphagia, oropharyngeal phase: Secondary | ICD-10-CM | POA: Diagnosis not present

## 2017-06-16 DIAGNOSIS — R278 Other lack of coordination: Secondary | ICD-10-CM

## 2017-06-16 NOTE — Therapy (Signed)
[facility] [address] [place], [place], [address] Phone: [phone]   Fax:  [phone]  Occupational Therapy Treatment  Patient Details  Name: [name] [name] MRN: [id number] Date of Birth: 13-Aug-1941 Referring Provider: Dr. Cathlyn Parsons PA-c  Encounter Date: 06/16/2017      OT End of Session - 06/16/17 1717    Visit Number 6   Number of Visits 17   Date for OT Re-Evaluation 07/23/17   Authorization Type MCR / tricare secondary   Authorization - Visit Number 6   Authorization - Number of Visits 10      Past Medical History:  Diagnosis Date  . REM sleep behavior disorder   . Sleep apnea     Past Surgical History:  Procedure Laterality Date  . APPENDECTOMY    . 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      Subjective Assessment - 06/16/17 1715    Subjective  Pt reports that he is not feeling well today   Patient Stated Goals improve coordination for ADLs   Currently in Pain? No/denies              Treatment: Therapist went througt exercise note book and streamlined pt's HEP and added to a flowsheet. Reviewed multi directional stepping exercises with patient. Dynamic step and reach with right and left UE/LE, stepping over targets, min difficulty/ v.c Arm bike x 5 mins level1 for conditioning                  OT Short Term Goals - 05/24/17 1313      OT SHORT TERM GOAL #1   Title Pt will be independent with PD-specific HEP   Time 4   Period Weeks   Status On-going   Target Date 06/23/17     OT SHORT TERM GOAL #2   Title Pt will improve R shoulder flex to at least 125 with -10* elbow extension for functional reaching (without cues)   Baseline 115 with -15 elbow ext   Time 4   Period Weeks   Status New     OT SHORT TERM GOAL #3   Title Pt will improve coordination for ADLs as shown by improving time on 9-hole peg test  by at least 5sec with R hand.   Baseline 51.22 secs   Time 4   Period Weeks   Status New     OT SHORT TERM GOAL #4   Title Pt will improve functional reaching/coordination for ADLs as shown by improving score on box and blocks test by at least 5 with RUE.   Baseline 37 blocks   Time 4   Period Weeks   Status New     OT SHORT TERM GOAL #5   Title Pt will demonstrate improved ease with dressing as evidenced by decreasing PPT#4 to 14 secs or less.   Baseline 17.57 secs   Time 4   Period Weeks   Status New           OT Long Term Goals - 05/24/17 1316      OT LONG TERM GOAL #1   Title Pt will verbalize understanding of adaptive strategies to incr ease of ADLs/IADLs prn.   Time 8   Period Weeks   Status New   Target Date 07/23/17     OT LONG TERM GOAL #2   Title Pt will decrease 3 button/ unbutton time to  40 secs or less   Baseline 44.46 secs   Time 8   Period Weeks   Status New     OT LONG TERM GOAL #3   Title Pt will improve coordination for ADLs as shown by improving time on 9-hole peg test by at least 10sec with R hand.   Baseline 51.22 secs    Time 8   Period Weeks   Status New     OT LONG TERM GOAL #4   Title Pt will verbalize understanding of ways to prevent future complications, and PD specific community resources.   Time 8   Period Weeks   Status New               Plan - 06/16/17 1717    Clinical Impression Statement Pt is progressing toward goals. He demonstrates understanding of updated exercise flow sheet   Rehab Potential Good   OT Frequency 2x / week   OT Duration 8 weeks   OT Treatment/Interventions Self-care/ADL training;Moist Heat;Fluidtherapy;DME and/or AE instruction;Splinting;Patient/family education;Balance training;Therapeutic exercises;Therapeutic activities;Therapeutic exercise;Cryotherapy;Iontophoresis;Neuromuscular education;Functional Mobility Training;Manual Therapy   Plan ways to prevent future complications, info about PWR!  class,       Patient will benefit from skilled therapeutic intervention in order to improve the following deficits and impairments:  Abnormal gait, Decreased coordination, Decreased range of motion, Difficulty walking, Decreased endurance, Decreased safety awareness, Impaired tone, Decreased activity tolerance, Decreased knowledge of precautions, Decreased balance, Decreased knowledge of use of DME, Pain, Impaired perceived functional ability, Decreased strength, Decreased mobility, Impaired UE functional use, Impaired flexibility  Visit Diagnosis: Other lack of coordination  Other symptoms and signs involving the nervous system  Other symptoms and signs involving the musculoskeletal system    Problem List There are no active problems to display for this patient.   Michele Kerlin 06/16/2017, 5:20 PM  [facility] [address] [place], [place], [address] Phone: [phone]   Fax:  [phone]  Name: [name] [name] MRN: [id number] Date of Birth: 1940/11/27

## 2017-06-16 NOTE — Therapy (Signed)
[facility] [address] [place], [place], [address] Phone: [phone]   Fax:  [phone]  Speech Language Pathology Treatment  Patient Details  Name: [name] [name] MRN: [id number] Date of Birth: 28-Jul-1941 Referring Provider: Clyde Canterbury PA  Encounter Date: 06/16/2017      End of Session - 06/16/17 1358    Visit Number 6   Number of Visits 17   Date for SLP Re-Evaluation 07/23/17   Authorization Type MCR needs G code   SLP Start Time 6962   SLP Stop Time  1356   SLP Time Calculation (min) 39 min   Activity Tolerance Patient tolerated treatment well      Past Medical History:  Diagnosis Date  . REM sleep behavior disorder   . Sleep apnea     Past Surgical History:  Procedure Laterality Date  . APPENDECTOMY    . 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      Subjective Assessment - 06/16/17 1325    Subjective "I lost my voice earlier today"   Currently in Pain? No/denies               ADULT SLP TREATMENT - 06/16/17 1329      General Information   Behavior/Cognition Alert;Cooperative;Pleasant mood     Treatment Provided   Treatment provided Cognitive-Linquistic     Pain Assessment   Pain Assessment No/denies pain     Cognitive-Linquistic Treatment   Treatment focused on Dysarthria   Skilled Treatment Facilitated volume with loud /a/ average of 85dB. Simple conversation  average 72dB with structured topics. Pt noted to have some hoarseness today - pt aware of this. Spontaneous converation average of 70dB with rare min A.       Assessment / Recommendations / Plan   Plan Continue with current plan of care     Progression Toward Goals   Progression toward goals Progressing toward goals            SLP Short Term Goals - 06/16/17 1356      SLP SHORT TERM GOAL #1   Title Pt will achieve average loud /a/ of average mid-upper 80sdB over 3  sessions   Baseline 06-08-17, 06-09-17, 06/16/17   Time 3   Period Weeks   Status Achieved     SLP SHORT TERM GOAL #2   Title Pt will use speech volume average low 70sdB when responding with sentences 18/20 over two sessions   Baseline 06-08-17, 06-09-17, 06/16/17   Time 3   Period Weeks   Status Achieved     SLP SHORT TERM GOAL #3   Title In 7 minutes simple conversation pt will achieve average speech volume of low 70s dB over two sessions   Baseline 06-09-17, 06/16/17   Time 3   Period Weeks   Status Achieved     SLP SHORT TERM GOAL #4   Title pt will demo abdominal breathing at rest 75% of the time with rare min A   Baseline 06-08-17   Time 3   Period Weeks   Status New     SLP SHORT TERM GOAL #5   Title pt will tell SLP 3 signs of aspiration pneumonia   Time 3   Period Weeks   Status On-going          SLP Long Term Goals - 06/16/17 1358      SLP LONG TERM GOAL #1  Title Pt will achieve average loud /a/ of average mid-upper 80sdB over total 6 sessions   Time 6   Period Weeks   Status On-going     SLP LONG TERM GOAL #2   Title pt will report lower frequency of people asking for repeats in conversation   Time 6   Period Weeks   Status On-going     SLP LONG TERM GOAL #3   Title Pt will use abdominal breathing in 5 minutes simple conversation 75% success over 2 sessions   Time 6   Period Weeks   Status On-going     SLP LONG TERM GOAL #4   Title Pt will maintain loudness average low 70s dB in 10 minutes simple conversation    Time 5   Period Weeks   Status On-going          Plan - 06/16/17 1352    Clinical Impression Statement Pt continues to improve with carryover of volume in converation following structured practice - he reports his wife still requests him to repeat himself at times. Continue skilled ST to maximize carryover of volume and intellgibility in a variety of environments.    Speech Therapy Frequency 2x / week   Treatment/Interventions  Aspiration precaution training;Environmental controls;Cueing hierarchy;SLP instruction and feedback;Functional tasks;Compensatory strategies;Patient/family education   Potential to Achieve Goals Good   Consulted and Agree with Plan of Care Patient      Patient will benefit from skilled therapeutic intervention in order to improve the following deficits and impairments:   Dysarthria and anarthria    Problem List There are no active problems to display for this patient.   Lovvorn, Annye Rusk MS, CCC-SLP 06/16/2017, 1:59 PM  [facility] [address] [place], [place], [address] Phone: [phone]   Fax:  [phone]   Name: CON ARGANBRIGHT MRN: [id number] Date of Birth: December 20, 1940

## 2017-06-16 NOTE — Patient Instructions (Addendum)
(  Exercise) Monday Tuesday Wednesday Thursday Friday Saturday Sunday  PWR! Hands/ coordination activities          PWR! stand          PWR! Laying down          [employer]! on all 4's          PWR! seated          Multi- directional step            Walking

## 2017-06-18 ENCOUNTER — Ambulatory Visit: Payer: Medicare Other

## 2017-06-18 ENCOUNTER — Ambulatory Visit: Payer: Medicare Other | Admitting: Occupational Therapy

## 2017-06-22 ENCOUNTER — Ambulatory Visit: Payer: Medicare Other | Admitting: Occupational Therapy

## 2017-06-22 ENCOUNTER — Ambulatory Visit: Payer: Medicare Other | Admitting: Speech Pathology

## 2017-06-22 DIAGNOSIS — R29898 Other symptoms and signs involving the musculoskeletal system: Secondary | ICD-10-CM

## 2017-06-22 DIAGNOSIS — R29818 Other symptoms and signs involving the nervous system: Secondary | ICD-10-CM

## 2017-06-22 DIAGNOSIS — R2681 Unsteadiness on feet: Secondary | ICD-10-CM

## 2017-06-22 DIAGNOSIS — R471 Dysarthria and anarthria: Secondary | ICD-10-CM

## 2017-06-22 DIAGNOSIS — R278 Other lack of coordination: Secondary | ICD-10-CM

## 2017-06-22 DIAGNOSIS — R293 Abnormal posture: Secondary | ICD-10-CM

## 2017-06-22 DIAGNOSIS — R1312 Dysphagia, oropharyngeal phase: Secondary | ICD-10-CM | POA: Diagnosis not present

## 2017-06-22 DIAGNOSIS — R2689 Other abnormalities of gait and mobility: Secondary | ICD-10-CM | POA: Diagnosis not present

## 2017-06-22 NOTE — Therapy (Signed)
[facility] [address] [place], [place], [address] Phone: [phone]   Fax:  [phone]  Speech Language Pathology Treatment  Patient Details  Name: LONDEN BOK MRN: [id number] Date of Birth: [birth year] Referring Provider: Clyde Canterbury PA  Encounter Date: 06/22/2017      End of Session - 06/22/17 1201    Visit Number 7   Number of Visits 17   Date for SLP Re-Evaluation 07/23/17   Authorization Type MCR needs G code   SLP Start Time 1102   SLP Stop Time  1146   SLP Time Calculation (min) 44 min   Activity Tolerance Patient tolerated treatment well      Past Medical History:  Diagnosis Date  . REM sleep behavior disorder   . Sleep apnea     Past Surgical History:  Procedure Laterality Date  . APPENDECTOMY    . 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      Subjective Assessment - 06/22/17 1110    Subjective "I've been talking not well - I've been catching myself, but my wife is asking me to speak up"   Currently in Pain? No/denies               ADULT SLP TREATMENT - 06/22/17 1112      General Information   Behavior/Cognition Alert;Cooperative;Pleasant mood     Treatment Provided   Treatment provided Cognitive-Linquistic     Pain Assessment   Pain Assessment No/denies pain     Cognitive-Linquistic Treatment   Treatment focused on Dysarthria   Skilled Treatment Recalibrated average of 85dB - with rare min A. Structured speech tasks with average of 70dB with rare min A. Multitasking, walking outside near noisy traffic while maintaining audible volume with rare min A over 15 minutes.      Assessment / Recommendations / Plan   Plan Continue with current plan of care     Progression Toward Goals   Progression toward goals Progressing toward goals          SLP Education - 06/22/17 1158    Education provided Yes   Education Details  consistent med schedule every 4-5 hours (reinforcing OT educations)   Person(s) Educated Patient   Methods Explanation;Verbal cues   Comprehension Verbalized understanding          SLP Short Term Goals - 06/22/17 1200      SLP SHORT TERM GOAL #1   Title Pt will achieve average loud /a/ of average mid-upper 80sdB over 3 sessions   Baseline 06-08-17, 06-09-17, 06/16/17   Time 3   Period Weeks   Status Achieved     SLP SHORT TERM GOAL #2   Title Pt will use speech volume average low 70sdB when responding with sentences 18/20 over two sessions   Baseline 06-08-17, 06-09-17, 06/16/17   Time 3   Period Weeks   Status Achieved     SLP SHORT TERM GOAL #3   Title In 7 minutes simple conversation pt will achieve average speech volume of low 70s dB over two sessions   Baseline 06-09-17, 06/16/17   Time 3   Period Weeks   Status Achieved     SLP SHORT TERM GOAL #4   Title pt will demo abdominal breathing at rest 75% of the time with rare min A   Baseline 06-08-17   Time 3   Period Weeks   Status New  SLP SHORT TERM GOAL #5   Title pt will tell SLP 3 signs of aspiration pneumonia   Time 2   Period Weeks   Status On-going          SLP Long Term Goals - 06/22/17 1201      SLP LONG TERM GOAL #1   Title Pt will achieve average loud /a/ of average mid-upper 80sdB over total 6 sessions   Time 5   Period Weeks   Status On-going     SLP LONG TERM GOAL #2   Title pt will report lower frequency of people asking for repeats in conversation   Time 5   Period Weeks   Status On-going     SLP LONG TERM GOAL #3   Title Pt will use abdominal breathing in 5 minutes simple conversation 75% success over 2 sessions   Time 5   Period Weeks   Status On-going     SLP LONG TERM GOAL #4   Title Pt will maintain loudness average low 70s dB in 10 minutes simple conversation    Time 5   Period Weeks   Status On-going          Plan - 06/22/17 1159    Clinical Impression Statement Pt  continues to improve with carryover of volume in converation following structured practice - he reports his wife still requests him to repeat himself at times. Continue skilled ST to maximize carryover of volume and intellgibility in a variety of environments. Pt requesting to be d/c'd next session as he is being d/c'd from OT and has many medical appointments with his wife. Will plan on d/cing next visits per his request.    Speech Therapy Frequency 2x / week   Treatment/Interventions Aspiration precaution training;Environmental controls;Cueing hierarchy;SLP instruction and feedback;Functional tasks;Compensatory strategies;Patient/family education   Potential to Achieve Goals Good      Patient will benefit from skilled therapeutic intervention in order to improve the following deficits and impairments:   Dysarthria and anarthria    Problem List There are no active problems to display for this patient.   Lovvorn, Annye Rusk MS, CCC-SLP 06/22/2017, 12:02 PM  [facility] [address] [place], [place], [address] Phone: [phone]   Fax:  [phone]   Name: TRUMAN ACEITUNO MRN: [id number] Date of Birth: [birth year]

## 2017-06-22 NOTE — Therapy (Signed)
[facility] [address] [place], [place], [address] Phone: [phone]   Fax:  [phone]  Occupational Therapy Treatment  Patient Details  Name: [name] [name] MRN: [id number] Date of Birth: 1941-02-26 Referring Provider: Dr. Cathlyn Parsons PA-c  Encounter Date: 06/22/2017      OT End of Session - 06/22/17 1455    Visit Number 7   Number of Visits 17   Date for OT Re-Evaluation 07/23/17   Authorization Type MCR / [employer] secondary   Authorization - Visit Number 7   Authorization - Number of Visits 10   OT Start Time 1761   OT Stop Time 1105   OT Time Calculation (min) 47 min   Activity Tolerance Patient tolerated treatment well   Behavior During Therapy [facility] for tasks assessed/performed      Past Medical History:  Diagnosis Date  . REM sleep behavior disorder   . Sleep apnea     Past Surgical History:  Procedure Laterality Date  . APPENDECTOMY    . 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      Subjective Assessment - 06/22/17 1453    Subjective  Pt reports that he is having trouble finding time to exercise due to caring for his wife but exercise chart helped   Patient Stated Goals improve coordination for ADLs   Currently in Pain? No/denies      Pt reports that he is not taking meds consistently and that he feels that tremors are affecting driving.  Pt educated on general principles of PD med timing and timing with meals as well as importance of consistency.  Pt instructed in importance of performing ADLs/IADLs with large amplitude to prevent future complications as well as to incr ease and assist in re-training the brain.  Emphasized importance of HEP to help with brain change/prevent future complications and recommended PWR! Moves class.  Sit>stand with large amplitude movement strategies with min cueing.                         OT Education - 06/22/17 1454    Education Details Using large amplitude movements for ADLs; PWR! moves class information; Timing of medication with meals and importance of consistency   Person(s) Educated Patient   Methods Explanation;Handout;Demonstration   Comprehension Verbalized understanding          OT Short Term Goals - 05/24/17 1313      OT SHORT TERM GOAL #1   Title Pt will be independent with PD-specific HEP   Time 4   Period Weeks   Status On-going   Target Date 06/23/17     OT SHORT TERM GOAL #2   Title Pt will improve R shoulder flex to at least 125 with -10* elbow extension for functional reaching (without cues)   Baseline 115 with -15 elbow ext   Time 4   Period Weeks   Status New     OT SHORT TERM GOAL #3   Title Pt will improve coordination for ADLs as shown by improving time on 9-hole peg test by at least 5sec with R hand.   Baseline 51.22 secs   Time 4   Period Weeks   Status New     OT SHORT TERM GOAL #4   Title Pt will improve functional reaching/coordination for ADLs as shown by improving score on box and blocks test by at least 5  with RUE.   Baseline 37 blocks   Time 4   Period Weeks   Status New     OT SHORT TERM GOAL #5   Title Pt will demonstrate improved ease with dressing as evidenced by decreasing PPT#4 to 14 secs or less.   Baseline 17.57 secs   Time 4   Period Weeks   Status New           OT Long Term Goals - 05/24/17 1316      OT LONG TERM GOAL #1   Title Pt will verbalize understanding of adaptive strategies to incr ease of ADLs/IADLs prn.   Time 8   Period Weeks   Status New   Target Date 07/23/17     OT LONG TERM GOAL #2   Title Pt will decrease 3 button/ unbutton time to 40 secs or less   Baseline 44.46 secs   Time 8   Period Weeks   Status New     OT LONG TERM GOAL #3   Title Pt will improve coordination for ADLs as shown by improving time on 9-hole peg test by at least 10sec with R hand.   Baseline 51.22  secs    Time 8   Period Weeks   Status New     OT LONG TERM GOAL #4   Title Pt will verbalize understanding of ways to prevent future complications, and PD specific community resources.   Time 8   Period Weeks   Status New               Plan - 06/22/17 1455    Clinical Impression Statement Pt is progressing towards goals and verbalizes understanding of education provided and use of large amplitude movement strategies for ADLs/IADLs.   Rehab Potential Good   OT Frequency 2x / week   OT Duration 8 weeks   OT Treatment/Interventions Self-care/ADL training;Moist Heat;Fluidtherapy;DME and/or AE instruction;Splinting;Patient/family education;Balance training;Therapeutic exercises;Therapeutic activities;Therapeutic exercise;Cryotherapy;Iontophoresis;Neuromuscular education;Functional Mobility Training;Manual Therapy   Plan continue with ways to prevent complications   OT Home Exercise Plan Education provided:  PWR! hands (basic 4); coordination HEP, PWR! basic 4 standing   Consulted and Agree with Plan of Care Patient      Patient will benefit from skilled therapeutic intervention in order to improve the following deficits and impairments:  Abnormal gait, Decreased coordination, Decreased range of motion, Difficulty walking, Decreased endurance, Decreased safety awareness, Impaired tone, Decreased activity tolerance, Decreased knowledge of precautions, Decreased balance, Decreased knowledge of use of DME, Pain, Impaired perceived functional ability, Decreased strength, Decreased mobility, Impaired UE functional use, Impaired flexibility  Visit Diagnosis: Other symptoms and signs involving the nervous system  Other symptoms and signs involving the musculoskeletal system  Other abnormalities of gait and mobility  Abnormal posture  Other lack of coordination  Unsteadiness on feet    Problem List There are no active problems to display for this  patient.   [facility] 06/22/2017, 3:02 PM  [facility] [address] [place], [place], [address] Phone: [phone]   Fax:  [phone]  Name: DENIRO LAYMON MRN: [id number] Date of Birth: 1941-07-28   Vianne Bulls, OTR/L [facility] [address] [place], [place]  [address] [phone] phone [phone] 06/22/17 3:06 PM

## 2017-06-22 NOTE — Patient Instructions (Signed)
  Take meds 1/2 hour before meals or 1 hour after  Be consistent - take PD meds about every 4 1/2 and 5 hours.

## 2017-06-24 ENCOUNTER — Encounter: Payer: Medicare Other | Admitting: Speech Pathology

## 2017-06-24 ENCOUNTER — Encounter: Payer: Medicare Other | Admitting: Occupational Therapy

## 2017-06-29 ENCOUNTER — Ambulatory Visit: Payer: Medicare Other | Admitting: Speech Pathology

## 2017-06-29 ENCOUNTER — Ambulatory Visit: Payer: Medicare Other | Admitting: Occupational Therapy

## 2017-06-29 DIAGNOSIS — R2689 Other abnormalities of gait and mobility: Secondary | ICD-10-CM

## 2017-06-29 DIAGNOSIS — R2681 Unsteadiness on feet: Secondary | ICD-10-CM

## 2017-06-29 DIAGNOSIS — R29818 Other symptoms and signs involving the nervous system: Secondary | ICD-10-CM | POA: Diagnosis not present

## 2017-06-29 DIAGNOSIS — R29898 Other symptoms and signs involving the musculoskeletal system: Secondary | ICD-10-CM | POA: Diagnosis not present

## 2017-06-29 DIAGNOSIS — R293 Abnormal posture: Secondary | ICD-10-CM

## 2017-06-29 DIAGNOSIS — R278 Other lack of coordination: Secondary | ICD-10-CM

## 2017-06-29 DIAGNOSIS — R471 Dysarthria and anarthria: Secondary | ICD-10-CM

## 2017-06-29 DIAGNOSIS — R1312 Dysphagia, oropharyngeal phase: Secondary | ICD-10-CM | POA: Diagnosis not present

## 2017-06-29 NOTE — Patient Instructions (Addendum)
  Continue daily loud "AH!" and oral reading  When you are asked to repeat yourself - take a big breath and Think Loud!  Continue to practice loud conversations throughout the week  Be aware of any changes in your swallowing - more frequent coughing on liquids, choking, strangling or feeling like food is going down the wrong pipe or getting stuck in your throat  Keep up the good work!!

## 2017-06-29 NOTE — Patient Instructions (Signed)

## 2017-06-29 NOTE — Therapy (Signed)
[facility] [address] [place], [place], [address] Phone: [phone]   Fax:  [phone]  Speech Language Pathology Treatment  Patient Details  Name: [name] [name] MRN: [id number] Date of Birth: 1940-09-30 Referring Provider: Clyde Canterbury PA  Encounter Date: 06/29/2017      End of Session - 06/29/17 1357    Visit Number 8   Number of Visits 17   Date for SLP Re-Evaluation 07/23/17   Authorization Type MCR needs G code   SLP Start Time 0539   SLP Stop Time  1358   SLP Time Calculation (min) 41 min   Activity Tolerance Patient tolerated treatment well      Past Medical History:  Diagnosis Date  . REM sleep behavior disorder   . Sleep apnea     Past Surgical History:  Procedure Laterality Date  . APPENDECTOMY    . 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      Subjective Assessment - 06/29/17 1321    Subjective "I can catch it pretty well when I start to talk low"   Currently in Pain? No/denies               ADULT SLP TREATMENT - 06/29/17 1321      General Information   Behavior/Cognition Alert;Cooperative;Pleasant mood     Treatment Provided   Treatment provided Cognitive-Linquistic     Pain Assessment   Pain Assessment No/denies pain     Cognitive-Linquistic Treatment   Treatment focused on Dysarthria   Skilled Treatment Loud /a/ to recalibrate volume with average of 87dB  and rare min A. Oral reading paragraph with average 73dB and rare min A. Complex conversation  with average 70dB with rare min A.      Assessment / Recommendations / Plan   Plan Continue with current plan of care     Progression Toward Goals   Progression toward goals Goals met, education completed, patient discharged from [facility] Education - 06/29/17 1355    Education provided Yes   Education Details continue loud /a/ and loud  conversation practice  upon d/c.    Person(s) Educated Patient   Methods Explanation;Handout   Comprehension Verbalized understanding    SPEECH THERAPY DISCHARGE SUMMARY  Visits from Start of Care: 8  Current functional level related to goals / functional outcomes: See goals below   Remaining deficits: dysarthria   Education / Equipment: Compensations for dysarthria, loud AH, s/s of dysphagia Plan: Patient agrees to discharge.  Patient goals were met. Patient is being discharged due to meeting the stated rehab goals.  ?????           SLP Short Term Goals - 06/29/17 1357      SLP SHORT TERM GOAL #1   Title Pt will achieve average loud /a/ of average mid-upper 80sdB over 3 sessions   Baseline 06-08-17, 06-09-17, 06/16/17   Time 3   Period Weeks   Status Achieved     SLP SHORT TERM GOAL #2   Title Pt will use speech volume average low 70sdB when responding with sentences 18/20 over two sessions   Baseline 06-08-17, 06-09-17, 06/16/17   Time 3   Period Weeks   Status Achieved     SLP SHORT TERM GOAL #3   Title In 7 minutes simple conversation pt will achieve average speech volume of low 70s dB  over two sessions   Baseline 06-09-17, 06/16/17   Time 3   Period Weeks   Status Achieved     SLP SHORT TERM GOAL #4   Title pt will demo abdominal breathing at rest 75% of the time with rare min A   Baseline 06-08-17   Time 3   Period Weeks   Status New     SLP SHORT TERM GOAL #5   Title pt will tell SLP 3 signs of aspiration pneumonia   Time 2   Period Weeks   Status On-going          SLP Long Term Goals - 07-13-2017 1357      SLP LONG TERM GOAL #1   Title Pt will achieve average loud /a/ of average mid-upper 80sdB over total 6 sessions   Time 5   Period Weeks   Status Achieved     SLP LONG TERM GOAL #2   Title pt will report lower frequency of people asking for repeats in conversation   Time 5   Period Weeks   Status Achieved     SLP LONG TERM GOAL #3   Title Pt will use  abdominal breathing in 5 minutes simple conversation 75% success over 2 sessions   Time 5   Period Weeks   Status Achieved     SLP LONG TERM GOAL #4   Title Pt will maintain loudness average low 70s dB in 10 minutes simple conversation    Time 5   Period Weeks   Status Achieved          Plan - 07-13-17 1355    Clinical Impression Statement Pt demonstrated average of 70dB over 25  minutes of complex conversation. He is aware of when his volume decreases with rare min A. He is to continue loud /a/ and conversation practice to maintain volume. D/c ST at this time. Pt in agreement.    Speech Therapy Frequency 2x / week   Treatment/Interventions Aspiration precaution training;Environmental controls;Cueing hierarchy;SLP instruction and feedback;Functional tasks;Compensatory strategies;Patient/family education   Potential to Achieve Goals Good   SLP Home Exercise Plan reviewed and handout provided   Consulted and Agree with Plan of Care Patient      Patient will benefit from skilled therapeutic intervention in order to improve the following deficits and impairments:   Dysarthria and anarthria      G-Codes - Jul 13, 2017 1358    Functional Assessment Tool Used skilled clinical judgment, NOMS   Functional Limitations Motor speech   Motor Speech Goal Status [phone]) At least 1 percent but less than 20 percent impaired, limited or restricted   Motor Speech Goal Status ([id number]) At least 1 percent but less than 20 percent impaired, limited or restricted  discharge status      Problem List There are no active problems to display for this patient.   Lovvorn, Annye Rusk MS, CCC-SLP 13-Jul-2017, 1:59 PM  [facility] [address] [place], [place], [address] Phone: [phone]   Fax:  [phone]   Name: [name] [name] MRN: [id number] Date of Birth: 05-Sep-1941

## 2017-06-30 NOTE — Therapy (Signed)
[facility] [address] [place], [place], [address] Phone: [phone]   Fax:  [phone]  Occupational Therapy Treatment  Patient Details  Name: [name] [name] MRN: [id number] Date of Birth: 1940-11-28 Referring Provider: Dr. Cathlyn Parsons PA-c  Encounter Date: 06/29/2017      OT End of Session - 06/29/17 1409    Visit Number 8   Number of Visits 17   Date for OT Re-Evaluation 07/23/17   Authorization Type MCR / tricare secondary   Authorization - Visit Number 8   Authorization - Number of Visits 10   OT Start Time 4656   OT Stop Time 1443   OT Time Calculation (min) 38 min   Activity Tolerance Patient tolerated treatment well   Behavior During Therapy [facility] for tasks assessed/performed      Past Medical History:  Diagnosis Date  . REM sleep behavior disorder   . Sleep apnea     Past Surgical History:  Procedure Laterality Date  . APPENDECTOMY    . 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      Subjective Assessment - 06/30/17 1638    Subjective  Pt reports that he is having trouble finding time to exercise due to caring for his wife but exercise chart helped   Patient Stated Goals improve coordination for ADLs   Currently in Pain? No/denies                              OT Education - 06/30/17 1641    Education provided Yes   Education Details ways to prevent future complications, checked progress towards goals, importance of continued regualr exercises and big movements with ADLs.   Person(s) Educated Patient   Methods Demonstration;Explanation;Verbal cues;Handout   Comprehension Verbalized understanding;Returned demonstration;Verbal cues required          OT Short Term Goals - 06/29/17 1411      OT SHORT TERM GOAL #1   Title Pt will be independent with PD-specific HEP   Status Achieved     OT SHORT TERM GOAL #2   Title Pt will improve R shoulder flex to at least 125 with -10* elbow extension for functional reaching (without cues)   Status Partially Met  125, -15     OT SHORT TERM GOAL #3   Title Pt will improve coordination for ADLs as shown by improving time on 9-hole peg test by at least 5sec with R hand.   Status Not Met  51.22 secs     OT SHORT TERM GOAL #4   Title Pt will improve functional reaching/coordination for ADLs as shown by improving score on box and blocks test by at least 5 with RUE.   Status Achieved  43 blocks     OT SHORT TERM GOAL #5   Title Pt will demonstrate improved ease with dressing as evidenced by decreasing PPT#4 to 14 secs or less.   Status Achieved  9.84 secs           OT Long Term Goals - 06/29/17 1440      OT LONG TERM GOAL #1   Title Pt will verbalize understanding of adaptive strategies to incr ease of ADLs/IADLs prn.   Time 8   Period Weeks   Status Achieved     OT LONG TERM GOAL #2   Title Pt will decrease 3 button/  unbutton time to 40 secs or less   Baseline 44.46 secs   Time 8   Period Weeks   Status Achieved  31.25     OT LONG TERM GOAL #3   Title Pt will improve coordination for ADLs as shown by improving time on 9-hole peg test by at least 10sec with R hand.   Baseline 51.22 secs    Time 8   Period Weeks   Status Not Met  51.22     OT LONG TERM GOAL #4   Title Pt will verbalize understanding of ways to prevent future complications, and PD specific community resources.   Time 8   Period Weeks   Status Achieved               Plan - 06/30/17 1647    Clinical Impression Statement Pt agrees with plans for discharge.   Rehab Potential Good   OT Frequency 2x / week   OT Duration 8 weeks   OT Treatment/Interventions Self-care/ADL training;Moist Heat;Fluidtherapy;DME and/or AE instruction;Splinting;Patient/family education;Balance training;Therapeutic exercises;Therapeutic activities;Therapeutic  exercise;Cryotherapy;Iontophoresis;Neuromuscular education;Functional Mobility Training;Manual Therapy   Plan d/c OT, PD screen in 6 mons   OT Home Exercise Plan Education provided:  PWR! hands (basic 4); coordination HEP, PWR! basic 4 standing   Consulted and Agree with Plan of Care Patient      Patient will benefit from skilled therapeutic intervention in order to improve the following deficits and impairments:  Abnormal gait, Decreased coordination, Decreased range of motion, Difficulty walking, Decreased endurance, Decreased safety awareness, Impaired tone, Decreased activity tolerance, Decreased knowledge of precautions, Decreased balance, Decreased knowledge of use of DME, Pain, Impaired perceived functional ability, Decreased strength, Decreased mobility, Impaired UE functional use, Impaired flexibility  Visit Diagnosis: Other symptoms and signs involving the nervous system  Other symptoms and signs involving the musculoskeletal system  Other abnormalities of gait and mobility  Abnormal posture  Other lack of coordination  Unsteadiness on feet      G-Codes - 07/27/17 1639    Functional Assessment Tool Used (Outpatient only) PPT#4:  9.84secs, 9 hole peg test RUE 51.22 secs, Box/ blocks: RUE 43 blocks,    Functional Limitation Self care   Self Care Goal Status (J0312) At least 1 percent but less than 20 percent impaired, limited or restricted   Self Care Discharge Status [phone]) At least 1 percent but less than 20 percent impaired, limited or restricted     OCCUPATIONAL THERAPY DISCHARGE SUMMARY    Current functional level related to goals / functional outcomes: Pt demonstrates overall good progress towards goals -see above.   Remaining deficits: Bradykinesia, decreased balance, abnormal posture, decreased coordination,    Education / Equipment: Pt was educated regarding : big movements with ADLs, HEP, ways to prevent future complications, and community resources. Pt  verbalizes understanding of all education.   Plan: Patient agrees to discharge.  Patient goals were partially met. Patient is being discharged due to being pleased with the current functional level.  ?????     Problem List There are no active problems to display for this patient.   [name] [name] 06/30/2017, 4:48 PM [name] [name], OTR/L [phone] Phone: [phone] 4:48 PM 06/30/17 [facility] [address] [place], [place], [address] Phone: [phone]   Fax:  [phone]  Name: [name] [name] MRN: [id number] Date of Birth: [birth year]

## 2017-07-01 ENCOUNTER — Encounter: Payer: Medicare Other | Admitting: Occupational Therapy

## 2017-07-01 ENCOUNTER — Encounter: Payer: Medicare Other | Admitting: Speech Pathology

## 2017-07-06 ENCOUNTER — Encounter: Payer: Medicare Other | Admitting: Speech Pathology

## 2017-07-06 ENCOUNTER — Encounter: Payer: Medicare Other | Admitting: Occupational Therapy

## 2017-07-08 ENCOUNTER — Encounter: Payer: Medicare Other | Admitting: Speech Pathology

## 2017-07-08 ENCOUNTER — Encounter: Payer: Medicare Other | Admitting: Occupational Therapy

## 2017-08-09 DIAGNOSIS — Z9989 Dependence on other enabling machines and devices: Secondary | ICD-10-CM | POA: Diagnosis not present

## 2017-08-09 DIAGNOSIS — G4733 Obstructive sleep apnea (adult) (pediatric): Secondary | ICD-10-CM | POA: Diagnosis not present

## 2017-08-09 DIAGNOSIS — G20 Parkinson's disease: Secondary | ICD-10-CM | POA: Diagnosis not present

## 2017-08-09 DIAGNOSIS — N400 Enlarged prostate without lower urinary tract symptoms: Secondary | ICD-10-CM | POA: Diagnosis not present

## 2017-08-13 DIAGNOSIS — M47812 Spondylosis without myelopathy or radiculopathy, cervical region: Secondary | ICD-10-CM | POA: Diagnosis not present

## 2017-08-13 DIAGNOSIS — M542 Cervicalgia: Secondary | ICD-10-CM | POA: Diagnosis not present

## 2017-08-18 ENCOUNTER — Other Ambulatory Visit: Payer: Self-pay | Admitting: Internal Medicine

## 2017-08-18 DIAGNOSIS — M542 Cervicalgia: Secondary | ICD-10-CM

## 2017-08-27 ENCOUNTER — Ambulatory Visit
Admission: RE | Admit: 2017-08-27 | Discharge: 2017-08-27 | Disposition: A | Discharge status: Home / Self Care | Payer: Medicare Other | Source: Ambulatory Visit | Attending: Internal Medicine | Admitting: Internal Medicine

## 2017-08-27 DIAGNOSIS — M4802 Spinal stenosis, cervical region: Secondary | ICD-10-CM | POA: Diagnosis not present

## 2017-08-27 DIAGNOSIS — M542 Cervicalgia: Secondary | ICD-10-CM | POA: Diagnosis not present

## 2017-08-27 DIAGNOSIS — M47892 Other spondylosis, cervical region: Secondary | ICD-10-CM | POA: Insufficient documentation

## 2017-08-27 DIAGNOSIS — M5031 Other cervical disc degeneration,  high cervical region: Secondary | ICD-10-CM | POA: Insufficient documentation

## 2017-10-27 DIAGNOSIS — G20 Parkinson's disease: Secondary | ICD-10-CM | POA: Diagnosis not present

## 2017-10-27 DIAGNOSIS — G4752 REM sleep behavior disorder: Secondary | ICD-10-CM | POA: Diagnosis not present

## 2018-02-21 DIAGNOSIS — Z9989 Dependence on other enabling machines and devices: Secondary | ICD-10-CM | POA: Diagnosis not present

## 2018-02-21 DIAGNOSIS — Z0000 Encounter for general adult medical examination without abnormal findings: Secondary | ICD-10-CM | POA: Diagnosis not present

## 2018-02-21 DIAGNOSIS — N400 Enlarged prostate without lower urinary tract symptoms: Secondary | ICD-10-CM | POA: Diagnosis not present

## 2018-02-21 DIAGNOSIS — G20 Parkinson's disease: Secondary | ICD-10-CM | POA: Diagnosis not present

## 2018-02-21 DIAGNOSIS — N529 Male erectile dysfunction, unspecified: Secondary | ICD-10-CM | POA: Diagnosis not present

## 2018-02-21 DIAGNOSIS — Z905 Acquired absence of kidney: Secondary | ICD-10-CM | POA: Diagnosis not present

## 2018-02-21 DIAGNOSIS — Z13220 Encounter for screening for lipoid disorders: Secondary | ICD-10-CM | POA: Diagnosis not present

## 2018-02-21 DIAGNOSIS — G4733 Obstructive sleep apnea (adult) (pediatric): Secondary | ICD-10-CM | POA: Diagnosis not present

## 2018-02-21 DIAGNOSIS — Z5181 Encounter for therapeutic drug level monitoring: Secondary | ICD-10-CM | POA: Diagnosis not present

## 2018-02-22 DIAGNOSIS — G4752 REM sleep behavior disorder: Secondary | ICD-10-CM | POA: Diagnosis not present

## 2018-02-22 DIAGNOSIS — G4733 Obstructive sleep apnea (adult) (pediatric): Secondary | ICD-10-CM | POA: Diagnosis not present

## 2018-03-01 ENCOUNTER — Ambulatory Visit: Payer: Medicare Other | Attending: Internal Medicine | Admitting: Physical Therapy

## 2018-03-01 ENCOUNTER — Ambulatory Visit: Payer: Medicare Other | Admitting: Occupational Therapy

## 2018-03-01 ENCOUNTER — Ambulatory Visit: Payer: Medicare Other | Admitting: Speech Pathology

## 2018-03-01 DIAGNOSIS — R29818 Other symptoms and signs involving the nervous system: Secondary | ICD-10-CM

## 2018-03-01 DIAGNOSIS — R471 Dysarthria and anarthria: Secondary | ICD-10-CM | POA: Insufficient documentation

## 2018-03-01 DIAGNOSIS — R2689 Other abnormalities of gait and mobility: Secondary | ICD-10-CM

## 2018-03-01 NOTE — Therapy (Signed)
[facility] [address] [place], [place], [address] Phone: [phone]   Fax:  [phone]  Patient Details  Name: [name] [name] MRN: [id number] Date of Birth: 1941-07-02 Referring Provider:  Madelyn Brunner, MD  Encounter Date: 03/01/2018   Speech Therapy Parkinson's Disease Screen   Decibel Level today: 68-72dB  (WNL=70-72 dB) with sound level meter 30cm away from pt's mouth. Pt's conversational volume has maintained since last treatment course  Pt has not experienced difficulty in swallowing warranting objective evaluation  Pt reports completing HEP for dysarthria regularly. He verbalizes strategies for volume with mod I.  Pt does does not require speech therapy services at this time. Recommend ST EVAL in another 6 months    Kyrianna Barletta, Annye Rusk MS, CCC-SLP 03/01/2018, 1:49 PM  [facility] [address] [place], [place], [address] Phone: [phone]   Fax:  [phone]

## 2018-03-01 NOTE — Therapy (Signed)
[facility] [address] [place], [place], [address] Phone: [phone]   Fax:  [phone]  Patient Details  Name: [name] [name] MRN: [id number] Date of Birth: 1940/09/30 Referring Provider:  Madelyn Brunner, MD  Encounter Date: 03/01/2018  Occupational Therapy Parkinson's Disease Screen  Hand dominance:  right   Physical Performance Test item #4 (donning/doffing jacket):  27.32 sec  Fastening/unfastening 3 buttons in:  36.56sec  9-hole peg test:    RUE  39.19 sec        LUE  26.44 sec  Change in ability to perform ADLs/IADLs:  Pt reports incr time for dressing, incr time in kitchen and fatigue for kitchen tasks.  Pt reports that he may be getting help for some meal prep soon.  Other Comments:  Pt reports participating in [employer] 3 days/week in [place].  OT/PWR! Moves HEP maybe 2x/week (inconsistent).  Pt would benefit from occupational therapy evaluation due to  Slowing/incr bradykinesia with ADLs/IADLs.  However, pt desires to wait and schedule evaluations in approx 6 months due to his personal obligations at this time.  Therefore, plan to request orders for evaluations in approx 6 months (pt scheduled evaluation today).     [facility] 03/01/2018, 1:21 PM  [facility] [address] [place], [place], [address] Phone: [phone]   Fax:  [place], OTR/L [facility] [address] [place], [place]  [address] [phone] phone [phone] 03/01/18 1:21 PM

## 2018-03-01 NOTE — Patient Instructions (Signed)
  [place]  [phone]  Audiologists    Get the persons attention before you speak  Use eye contact and face the person you are speaking to  Be in close proximity to the person you are speaking to  Turn down any noise in the environment such as the TV, walk away from loud appliances, air conditioners, fans, dish washers, faucets etc

## 2018-03-01 NOTE — Therapy (Signed)
[facility] [address] [place], [place], [address] Phone: [phone]   Fax:  [phone]  Patient Details  Name: [name] [name] MRN: [id number] Date of Birth: 22-Dec-1940 Referring Provider:  Madelyn Brunner, MD  Encounter Date: 03/01/2018  Physical Therapy Parkinson's Disease Screen   Timed Up and Go test: 10.57 sec  10 meter walk test: 8.41 sec (3.9 ft/sec)  5 time sit to stand test: 9.34 sec  Previous screen numbers from 04/2017:  TUG 9.57 sec, gt vel 3.42 ft/sec, 5x:  9.09 sec.  Pt noted to have slowed slightly with TUG score and 5x sit<>stand.  His gait pattern includes decreased timing and coordination of RLE swing through with gait. Pt feels he is very careful and does not notice balance changes.  He would like to hold on PT at this time, but pursue PT eval in 6 months.  Patient does not require Physical Therapy services at this time.  Recommend Physical Therapy EVAL in 6 months.     Rad Gramling W. 03/01/2018, 1:49 PM  Mady Haagensen, PT 03/01/18 1:59 PM Phone: [phone] Fax: [facility] [address] [place], [place], [address] Phone: [phone]   Fax:  [phone]

## 2018-04-06 DIAGNOSIS — H04123 Dry eye syndrome of bilateral lacrimal glands: Secondary | ICD-10-CM | POA: Diagnosis not present

## 2018-04-06 DIAGNOSIS — H2513 Age-related nuclear cataract, bilateral: Secondary | ICD-10-CM | POA: Diagnosis not present

## 2018-04-06 DIAGNOSIS — H43813 Vitreous degeneration, bilateral: Secondary | ICD-10-CM | POA: Diagnosis not present

## 2018-04-14 DIAGNOSIS — D225 Melanocytic nevi of trunk: Secondary | ICD-10-CM | POA: Diagnosis not present

## 2018-04-14 DIAGNOSIS — D2261 Melanocytic nevi of right upper limb, including shoulder: Secondary | ICD-10-CM | POA: Diagnosis not present

## 2018-04-14 DIAGNOSIS — L821 Other seborrheic keratosis: Secondary | ICD-10-CM | POA: Diagnosis not present

## 2018-04-14 DIAGNOSIS — D2272 Melanocytic nevi of left lower limb, including hip: Secondary | ICD-10-CM | POA: Diagnosis not present

## 2018-04-14 DIAGNOSIS — D2262 Melanocytic nevi of left upper limb, including shoulder: Secondary | ICD-10-CM | POA: Diagnosis not present

## 2018-04-14 DIAGNOSIS — D2271 Melanocytic nevi of right lower limb, including hip: Secondary | ICD-10-CM | POA: Diagnosis not present

## 2018-04-14 DIAGNOSIS — L218 Other seborrheic dermatitis: Secondary | ICD-10-CM | POA: Diagnosis not present

## 2018-05-24 DIAGNOSIS — G20 Parkinson's disease: Secondary | ICD-10-CM | POA: Diagnosis not present

## 2018-05-24 DIAGNOSIS — G4752 REM sleep behavior disorder: Secondary | ICD-10-CM | POA: Diagnosis not present

## 2018-06-07 DIAGNOSIS — Z9079 Acquired absence of other genital organ(s): Secondary | ICD-10-CM | POA: Diagnosis not present

## 2018-06-07 DIAGNOSIS — Z87438 Personal history of other diseases of male genital organs: Secondary | ICD-10-CM | POA: Diagnosis not present

## 2018-08-09 ENCOUNTER — Ambulatory Visit: Payer: Medicare Other | Admitting: Physical Therapy

## 2018-08-09 ENCOUNTER — Ambulatory Visit: Payer: Medicare Other | Admitting: Speech Pathology

## 2018-08-09 ENCOUNTER — Ambulatory Visit: Payer: Medicare Other | Admitting: Occupational Therapy

## 2018-08-29 ENCOUNTER — Encounter: Payer: Self-pay | Admitting: Physical Therapy

## 2018-08-29 ENCOUNTER — Ambulatory Visit: Payer: Medicare Other | Attending: Neurology | Admitting: Physical Therapy

## 2018-08-29 DIAGNOSIS — R2681 Unsteadiness on feet: Secondary | ICD-10-CM | POA: Diagnosis not present

## 2018-08-29 DIAGNOSIS — M25621 Stiffness of right elbow, not elsewhere classified: Secondary | ICD-10-CM | POA: Insufficient documentation

## 2018-08-29 DIAGNOSIS — R471 Dysarthria and anarthria: Secondary | ICD-10-CM | POA: Diagnosis not present

## 2018-08-29 DIAGNOSIS — R293 Abnormal posture: Secondary | ICD-10-CM | POA: Insufficient documentation

## 2018-08-29 DIAGNOSIS — R29898 Other symptoms and signs involving the musculoskeletal system: Secondary | ICD-10-CM | POA: Insufficient documentation

## 2018-08-29 DIAGNOSIS — R278 Other lack of coordination: Secondary | ICD-10-CM | POA: Diagnosis not present

## 2018-08-29 DIAGNOSIS — R29818 Other symptoms and signs involving the nervous system: Secondary | ICD-10-CM | POA: Diagnosis not present

## 2018-08-29 DIAGNOSIS — R2689 Other abnormalities of gait and mobility: Secondary | ICD-10-CM | POA: Insufficient documentation

## 2018-08-30 NOTE — Therapy (Signed)
[facility] [address] [place], [place], [address] Phone: [phone]   Fax:  [phone]  Physical Therapy Evaluation  Patient Details  Name: [name] [name] MRN: [id number] Date of Birth: 05-04-1941 Referring Provider (PT): Yetta Flock, MD (([facility]))   Encounter Date: 08/29/2018  PT End of Session - 08/30/18 0848    Visit Number  1    Number of Visits  1    Authorization Type  Medicare/Tricare    PT Start Time  1607    PT Stop Time  1316    PT Time Calculation (min)  42 min    Activity Tolerance  Patient tolerated treatment well    Behavior During Therapy  [facility] for tasks assessed/performed       Past Medical History:  Diagnosis Date  . REM sleep behavior disorder   . Sleep apnea     Past Surgical History:  Procedure Laterality Date  . APPENDECTOMY    . 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   Subjective Assessment - 08/29/18 1238    Subjective  Doing about the same.  No reported falls, no changes.  I feel like I might be a little better.  Limp a little, especially when I'm tired.  Really just wanted to get an eval every 6 months or so to see how I'm doing.    Pertinent History  Parkinson's disease    Patient Stated Goals  Pt's goals for therapy-can't think of anything.    Currently in Pain?  No/denies         [facility] PT Assessment - 08/29/18 1240      Assessment   Medical Diagnosis  Parkinson's disease    Referring Provider (PT)  Yetta Flock, MD   ([facility])   Onset Date/Surgical Date  --   from screens 02/2018     Precautions   Precautions  Fall      Balance Screen   Has the patient fallen in the past 6 months  No    Has the patient had a decrease in activity level because of a fear of falling?   No    Is the patient reluctant to leave their home because of a fear of falling?   No      Home Film/video editor residence    Living  Arrangements  Spouse/significant other    Available Help at Discharge  Family    Type of [place] to enter    Entrance Stairs-Number of Steps  5    Entrance Stairs-Rails  Right    [place]  Two level;Able to live on main level with bedroom/bathroom    Home Equipment  None    Additional Comments  Provides care and assistance for wife      Prior Function   Level of Independence  Independent    Leisure  Enjoys 3x/week [employer]); enjoys outside work and gardening      Observation/Other Assessments   Focus on Therapeutic Outcomes (FOTO)   NA      ROM / Strength   AROM / PROM / Strength  Strength      Strength   Overall Strength  Within functional limits for tasks performed      Transfers   Transfers  Sit to Stand;Stand to Sit    Sit to Stand  6: Modified independent (Device/Increase  time);Without upper extremity assist;From chair/3-in-1    Five time sit to stand comments   7.29    Stand to Sit  6: Modified independent (Device/Increase time);Without upper extremity assist;To chair/3-in-1      Ambulation/Gait   Ambulation/Gait  Yes    Ambulation/Gait Assistance  7: Independent    Ambulation Distance (Feet)  200 Feet    Assistive device  None    Gait Pattern  Step-through pattern;Decreased arm swing - right;Decreased step length - right    Ambulation Surface  Level;Indoor    Gait velocity  9.09 sec = 3.61 ft/sec      Standardized Balance Assessment   Standardized Balance Assessment  Timed Up and Go Test      Timed Up and Go Test   Normal TUG (seconds)  9.87    Manual TUG (seconds)  10.87    Cognitive TUG (seconds)  9.75    TUG Comments  Scores >13.5-15 seconds indicate increased fall risk      High Level Balance   High Level Balance Comments  MiniBESTest score:  25/28       On MiniBESTest, pt scores 1 on SLS, stepping over high obstacle, and head turns with gait.         Objective measurements completed on  examination: See above findings.              PT Education - 08/30/18 0847    Education Details  Results of objective measures (very comparable to previous screens) and appears to have no need for skilled PT at this time    Person(s) Educated  Patient    Methods  Explanation    Comprehension  Verbalized understanding                  Plan - 08/30/18 0849    Clinical Impression Statement  Pt is a 77 year old male who presents to OPPT with history of Parkinson's disease, REM sleep disorder.  He was seen for screens in June 2019, where he showed slightly slowed mobility measures and desired a follow-up evaluation.  With full evaluation today, pt demonstrates mobility measures within functional limits for transfers, gait and balance.  He does demonstrate decreased timing and coordination of RLE with gait at times; however, pt feels this has not changed or worsened.  He has had no falls and is actively participating in community fitness at least 3 times per week.  He does not appear to have skilled physical therapy needs at this time and is agreeable to return PT screen in 6 months.    History and Personal Factors relevant to plan of care:  Parkinson's disease, REM sleep disorder    Clinical Presentation  Stable    Clinical Presentation due to:  hx of PD    Clinical Decision Making  Low    PT Frequency  One time visit   Eval only   PT Next Visit Plan  Pt does not appear to have skilled PT needs at this time; recommend PT screen in 6 months.    Consulted and Agree with Plan of Care  Patient       Patient will benefit from skilled therapeutic intervention in order to improve the following deficits and impairments:     Visit Diagnosis: Other abnormalities of gait and mobility     Problem List There are no active problems to display for this patient.   MARRIOTT,AMY W. 08/30/2018, 8:54 AM Frazier Butt., PT [facility]  [facility] [address] [place], [place], [address] Phone: [phone]   Fax:  [phone]  Name: [name] [name] MRN: [id number] Date of Birth: 01-15-1941

## 2018-09-06 ENCOUNTER — Other Ambulatory Visit: Payer: Self-pay

## 2018-09-06 ENCOUNTER — Ambulatory Visit: Payer: Medicare Other | Admitting: Speech Pathology

## 2018-09-06 ENCOUNTER — Encounter: Payer: Self-pay | Admitting: Occupational Therapy

## 2018-09-06 ENCOUNTER — Ambulatory Visit: Payer: Medicare Other | Admitting: Occupational Therapy

## 2018-09-06 DIAGNOSIS — R278 Other lack of coordination: Secondary | ICD-10-CM

## 2018-09-06 DIAGNOSIS — R29818 Other symptoms and signs involving the nervous system: Secondary | ICD-10-CM

## 2018-09-06 DIAGNOSIS — R293 Abnormal posture: Secondary | ICD-10-CM | POA: Diagnosis not present

## 2018-09-06 DIAGNOSIS — R2681 Unsteadiness on feet: Secondary | ICD-10-CM | POA: Diagnosis not present

## 2018-09-06 DIAGNOSIS — R2689 Other abnormalities of gait and mobility: Secondary | ICD-10-CM

## 2018-09-06 DIAGNOSIS — M25621 Stiffness of right elbow, not elsewhere classified: Secondary | ICD-10-CM

## 2018-09-06 DIAGNOSIS — R471 Dysarthria and anarthria: Secondary | ICD-10-CM

## 2018-09-06 DIAGNOSIS — R29898 Other symptoms and signs involving the musculoskeletal system: Secondary | ICD-10-CM | POA: Diagnosis not present

## 2018-09-06 NOTE — Patient Instructions (Signed)
   Loud AH! 5x a day (effort of 8/10)  Read aloud 5 minutes 5 days a week so that your wife can hear you in the other room  You can come back sooner than 6 months if you need to - you don't have to wait 6 months if you are having difficulties

## 2018-09-06 NOTE — Therapy (Signed)
[facility] [address] [name], [place], [address] Phone: [phone]   Fax:  [phone]  Occupational Therapy Evaluation  Patient Details  Name: [name] [name] MRN: [id number] Date of Birth: 09/13/1940 Referring Provider (OT): Dr. Yetta Flock   Encounter Date: 09/06/2018  OT End of Session - 09/06/18 1544    Visit Number  1    Number of Visits  1    Authorization Type  Medicare & Tricare for Life, covered 100%    OT Start Time  1150    OT Stop Time  1239    OT Time Calculation (min)  49 min    Activity Tolerance  Patient tolerated treatment well    Behavior During Therapy  [facility] for tasks assessed/performed       Past Medical History:  Diagnosis Date  . REM sleep behavior disorder   . Sleep apnea     Past Surgical History:  Procedure Laterality Date  . APPENDECTOMY    . 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  Subjective Assessment - 09/06/18 1153    Subjective   I'm doing everything, just may be slower.  Pt reports no significant changes in last 6 months    Pertinent History  Parkinson's disease, REM sleep disorder, sleep apnea, kidney donation    Currently in Pain?  No/denies        [facility] OT Assessment - 09/06/18 1155      Assessment   Medical Diagnosis  Parkinson's disease    Referring Provider (OT)  Dr. Yetta Flock    Onset Date/Surgical Date  --   08/10/18 referral, last OT screen 03/01/18   Hand Dominance  Right    Prior Therapy  OT screen 03/01/18, last OT treatment d/c 06/29/17      Precautions   Precautions  Fall      Balance Screen   Has the patient fallen in the past 6 months  No      Prior Function   Level of Independence  Independent    Vocation  Retired    Leisure  Enjoys [employer]); enjoys outside work and gardening      ADL   Eating/Feeding  Modified independent   using soup spoon more than fork/small  spoon   Grooming  --   using LUE more than R for grooming   Upper Body Bathing  Modified independent    Lower Body Bathing  Modified independent    Upper Body Dressing  Increased time   difficulty with buttons   Lower Body Dressing  Modified independent    Toilet Transfer  Modified independent    Toileting - Clothing Manipulation  Modified independent    [place] Transfer  --   shower stall, bench seat   Transfers/Ambulation Related to ADL's  mod I per pt report    ADL comments  now has approx 15hrs help/week for cooking/cleaning tasks      IADL   Shopping  Takes care of all shopping needs independently    Prior Level of Function Light Housekeeping  incr time now    Prior Level of Function Meal Prep  incr time now, difficulty stirring with [place] own vehicle    Medication Management  Is responsible for taking medication in correct dosages at correct time    Financial Management  Manages financial matters independently (budgets, writes checks, pays rent, bills goes to bank), collects and keeps track of income      Mobility   Mobility Status  Independent    Mobility Status Comments  see PT eval for details      Written Expression   Dominant Hand  Right    Handwriting  Increased time;100% legible   printing     Vision - History   Baseline Vision  Wears glasses all the time      Cognition   Overall Cognitive Status  Within Functional Limits for tasks assessed   pt denies change     Observation/Other Assessments   Standing Functional Reach Test  R-10", L-10"    Other Surveys   Select    Physical Performance Test    Yes    Simulated Eating Time (seconds)  13.85   holds spoon at very end of utensil   Donning Doffing Jacket Time (seconds)  10.49    Donning Doffing Jacket Comments  Fasten/unfasten 3 buttons in 30.22sec      Posture/Postural Control   Posture/Postural Control  Postural limitations     Postural Limitations  Forward head;Rounded Shoulders      Coordination   9 Hole Peg Test  Right;Left    Right 9 Hole Peg Test  30.31    Left 9 Hole Peg Test  27.94    Box and Blocks  R-47blocks, L-52blocks    Tremors  bilateral hand tremor with rushing, fatigue, anxiety (R>L)      Tone   Assessment Location  Right Upper Extremity;Left Upper Extremity      ROM / Strength   AROM / PROM / Strength  AROM      AROM   Overall AROM   Deficits    Overall AROM Comments  shoulder flex R 135* with -30* elbow ext (-25* when cued), L shoulder flex 135* with elbow ext WNL      RUE Tone   RUE Tone  Mild      LUE Tone   LUE Tone  Within Functional Limits                      OT Education - 09/06/18 1541    Education Details  OT eval results; Recommendation that pt continue and refocus on coordination HEP, focus on elbow ext with RUE functional reach and with rock steady boxing, emphasize trunk movement in boxing class, open hand prior to grasp of objects with R hand, and hold spoon/fork in middle of handle vs. end; indications for earlier return to therapy    Person(s) Educated  Patient    Methods  Explanation    Comprehension  Verbalized understanding                 Plan - 09/06/18 1544    Clinical Impression Statement  Pt is a 77 y.o. male with Parkinson's disease.  Pt is known to this therapist from previous occupational therapy.  PMH includes:  REM sleep disorder, sleep apnea, kidney donation.  Pt was last seen for OT PD screen 03/01/18 and OT eval was recommended at that time due to slowing/incr bradykinesia with ADLs.  Pt presents with bradykinesia, rigidity, decr ROM, decr coordination, decr balance, and decr posture.  However, pt reports/demo improvement since screen and now has assist with cooking/cleaning tasks.  Pt's objective measurements are now close to when last discharged from OT 06/2017.   Therefore, no ongoing occupational therapy  recommended at this  time (pt in agreement).    Occupational Profile and client history currently impacting functional performance  Pt reports that he now has assist for cooking/cleaning tasks, but still does most IADLs due to wife's medical status.  Pt is mod I with BADLs.    Occupational performance deficits (Please refer to evaluation for details):  ADL's;IADL's;Leisure    Rehab Potential  Good    OT Frequency  --   eval only   OT Treatment/Interventions  Self-care/ADL training;DME and/or AE instruction;Neuromuscular education;Patient/family education    Plan  OT PD screen in approx 6 months due to progressive nature of diagnosis.  Pt to continue with community exercise, previous therapy recommendations, and HEP.  Pt agrees with plan.    Clinical Decision Making  Limited treatment options, no task modification necessary    Recommended Other Services  none    Consulted and Agree with Plan of Care  Patient       Patient will benefit from skilled therapeutic intervention in order to improve the following deficits and impairments:  Decreased range of motion, Impaired perceived functional ability, Impaired tone, Impaired UE functional use, Improper body mechanics, Decreased coordination, Decreased balance  Visit Diagnosis: Other symptoms and signs involving the nervous system  Other symptoms and signs involving the musculoskeletal system  Abnormal posture  Other lack of coordination  Unsteadiness on feet  Other abnormalities of gait and mobility  Stiffness of right elbow, not elsewhere classified    Problem List There are no active problems to display for this patient.   [facility] 09/06/2018, 3:58 PM  [facility] [address] [place], [place], [address] Phone: [phone]   Fax:  [phone]  Name: [name] [name] MRN: [id number] Date of Birth: 11/27/1940   Vianne Bulls, OTR/L [facility] [address] [place], [place]  [address] [phone] phone [phone] 09/06/18 3:58 PM

## 2018-09-06 NOTE — Therapy (Signed)
[facility] [address] [place], [place], [address] Phone: [phone]   Fax:  [phone]  Speech Language Pathology Evaluation  Patient Details  Name: TAGE FEGGINS MRN: [id number] Date of Birth: 1941/05/10 Referring Provider (SLP): Dr. Randa Evens   Encounter Date: 09/06/2018  End of Session - 09/06/18 1137    Visit Number  1    Number of Visits  1    Date for SLP Re-Evaluation  03/07/19    SLP Start Time  38    SLP Stop Time   1140    SLP Time Calculation (min)  38 min    Activity Tolerance  Patient tolerated treatment well       Past Medical History:  Diagnosis Date  . REM sleep behavior disorder   . Sleep apnea     Past Surgical History:  Procedure Laterality Date  . APPENDECTOMY    . 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  Subjective Assessment - 09/06/18 1114    Subjective  "My wife gets frustrated sometimes, I think her hearing is going a little"    Currently in Pain?  No/denies         SLP Evaluation [facility] - 09/06/18 1114      SLP Visit Information   SLP Received On  09/06/18    Referring Provider (SLP)  Dr. Randa Evens    Onset Date  last screen 02/2018    Medical Diagnosis  Parkinson's Disease      Subjective   Patient/Family Stated Goal  "Don't really need anything"      General Information   HPI  Mr. Alvizo is 77  y.o. male well known to [place] from prior course of ST. due to PD. His last screen was 02/2018. He returns for evaluation today. He denies any changes is speech and swallowing    Mobility Status  walks independently      Balance Screen   Has the patient fallen in the past 6 months  No    Has the patient had a decrease in activity level because of a fear of falling?   No    Is the patient reluctant to leave their home because of a fear of falling?   No      Prior Functional Status   Cognitive/Linguistic Baseline  Within  functional limits    Type of Home  House     Lives With  Spouse    Available Support  Family    Vocation  Retired      Associate Professor   Overall Cognitive Status  Within Functional Limits for tasks assessed      Visual Recognition/Discrimination   Discrimination  Not tested      Expression   Primary Mode of Expression  Verbal      Verbal Expression   Overall Verbal Expression  Appears within functional limits for tasks assessed      Oral Motor/Sensory Function   Overall Oral Motor/Sensory Function  Appears within functional limits for tasks assessed      Motor Speech   Overall Motor Speech  Appears within functional limits for tasks assessed    Respiration  Within functional limits    Phonation  Normal    Resonance  Within functional limits    Articulation  Within functional limitis    Intelligibility  Intelligible    Motor Planning  Witnin functional limits    Motor Speech Errors  Not applicable    Interfering Components  Premorbid status    Effective Techniques  Increased vocal intensity                      SLP Education - 09/06/18 1136    Education Details  Loud ah 5x and read aloud 5/7 days ; log for these exercises    Person(s) Educated  Patient    Methods  Explanation;Handout    Comprehension  Verbalized understanding           Plan - 09/06/18 1143    Clinical Impression Statement  Mr. Dollinger presents today with speech and langugae WNL. He averaged 68 to 70dB in conversation over 18 minutes. He required min verbal cue and 1 demonstration for accurate completion of loud /a/ to average 92dB. He rated this an effort of 8/10. Currentlyk Mr. Salehi reports completing loud /a/ 1x a week. I provided instruction and daily log to improve frequency of speech exercises 5/7 days each week. Pt verbalized understanding of need to complete speech/voice exercises daily. Mr. Monnier denies difficulty communicating with his family and in the community. He does state  his wife sometimes has difficulty hearing him, however he is able to repair this independently. At this time, I do not recommend skilled ST. Recommend screen vs re-eval in 6 months, unless pt has decline before then.     Potential Considerations  Medical prognosis    SLP Home Exercise Plan  loud /a/ and reading aloud - see pt instructions    Consulted and Agree with Plan of Care  Patient       Patient will benefit from skilled therapeutic intervention in order to improve the following deficits and impairments:   Dysarthria and anarthria    Problem List There are no active problems to display for this patient.   Noel Rodier, Annye Rusk MS, CCC-SLP 09/06/2018, 11:48 AM  [facility] [address] [place], [place], [address] Phone: [phone]   Fax:  [phone]  Name: ORLANDUS BOROWSKI MRN: [id number] Date of Birth: 03-May-1941

## 2018-09-26 ENCOUNTER — Encounter: Payer: Self-pay | Admitting: Family Medicine

## 2018-09-26 ENCOUNTER — Ambulatory Visit (INDEPENDENT_AMBULATORY_CARE_PROVIDER_SITE_OTHER): Payer: Medicare Other | Admitting: Family Medicine

## 2018-09-26 DIAGNOSIS — Z7689 Persons encountering health services in other specified circumstances: Secondary | ICD-10-CM | POA: Diagnosis not present

## 2018-09-26 NOTE — Progress Notes (Signed)
Date:  09/26/2018   Name:  [name] [name]   DOB:  [birth year]   MRN:  [id number]   Chief Complaint: Establish Care (switching from [facility])  Patient is a 78 year old male who presents for an establishment of care exam. The patient reports the following problems: parkinsonism. Health maintenance has been reviewed up to date.   Review of Systems  Constitutional: Negative for activity change, chills, fatigue, fever and unexpected weight change.  HENT: Positive for postnasal drip. Negative for congestion, drooling, ear discharge, ear pain and sore throat.   Eyes: Positive for visual disturbance.  Respiratory: Negative for cough, chest tightness, shortness of breath and wheezing.   Cardiovascular: Negative for chest pain, palpitations and leg swelling.  Gastrointestinal: Negative for abdominal pain, blood in stool, constipation, diarrhea and nausea.  Endocrine: Negative for polydipsia.  Genitourinary: Negative for dysuria, frequency, genital sores, hematuria, scrotal swelling, testicular pain and urgency.       S/p turp  Musculoskeletal: Negative for back pain, myalgias, neck pain and neck stiffness.  Skin: Negative for color change, pallor, rash and wound.       xerodermia  Allergic/Immunologic: Negative for environmental allergies.  Neurological: Negative for dizziness and headaches.  Hematological: Negative for adenopathy. Does not bruise/bleed easily.  Psychiatric/Behavioral: Positive for sleep disturbance. Negative for suicidal ideas. The patient is not nervous/anxious.     Patient Active Problem List   Diagnosis Date Noted  . 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 Parkinson's disease ([place]) 11/07/2015  . 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 Hx of colonic polyps 05/18/2014  . BPH (benign prostatic hyperplasia) 01/12/2014  . Obstructive sleep apnea (adult) (pediatric) 01/17/2012  . 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    Allergies    Allergen Reactions  . Influenza Vaccines Anaphylaxis    Flu shot: 1974 anaphylaxis. 2nd time swollen arm Flu shot: 1974 anaphylaxis. 2nd time swollen arm     Past Surgical History:  Procedure Laterality Date  . APPENDECTOMY    . 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 Smoking status: Former Smoker    Types: Cigarettes, Cigars    Last attempt to quit: 09/07/2010    Years since quitting: 8.0  . Smokeless tobacco: Never Used  Substance Use Topics  . Alcohol use: Yes    Comment: occasionally  . Drug use: Never     Medication list has been reviewed and updated.  Current Meds  Medication Sig  . carbidopa-levodopa (SINEMET IR) 25-100 MG tablet Take 2 tablets by mouth 3 (three) times daily. Dr Anna Genre  . clonazePAM (KLONOPIN) 0.5 MG tablet Take 1 tablet by mouth at bedtime. Dr Anna Genre  . Glycerin-Hypromellose-PEG 400 (VISINE DRY EYE OP) Apply to eye. otc  . ibuprofen (ADVIL,MOTRIN) 600 MG tablet Take by mouth as needed. otc  . [DISCONTINUED] carbidopa-levodopa (SINEMET IR) 25-100 MG tablet Take 1 tablet by mouth 3 (three) times daily.    PHQ 2/9 Scores 09/26/2018  PHQ - 2 Score 0  PHQ- 9 Score 0    Physical Exam Vitals signs and nursing note reviewed.  HENT:     Head: Normocephalic.     Right Ear: Tympanic membrane, ear canal and external ear normal.     Left Ear: Tympanic membrane, ear canal and external ear normal.     Nose: Nose normal. No congestion or rhinorrhea.     Mouth/Throat:     Mouth: Mucous membranes  are moist.  Eyes:     General: No scleral icterus.       Right eye: No discharge.        Left eye: No discharge.     Conjunctiva/sclera: Conjunctivae normal.     Pupils: Pupils are equal, round, and reactive to light.  Neck:     Musculoskeletal: Normal range of motion and neck supple.     Thyroid: No thyromegaly.     Vascular: No JVD.     Trachea: No tracheal deviation.  Cardiovascular:     Rate and Rhythm: Normal rate and regular  rhythm.     Pulses: Normal pulses.     Heart sounds: Normal heart sounds. No murmur. No friction rub. No gallop.   Pulmonary:     Effort: Pulmonary effort is normal. No respiratory distress.     Breath sounds: Normal breath sounds. No stridor. No wheezing, rhonchi or rales.  Chest:     Chest wall: No tenderness.  Abdominal:     General: Abdomen is flat. Bowel sounds are normal. There is no distension.     Palpations: Abdomen is soft. There is no mass.     Tenderness: There is no abdominal tenderness. There is no guarding or rebound.     Hernia: No hernia is present.  Musculoskeletal: Normal range of motion.        General: No swelling or tenderness.  Lymphadenopathy:     Cervical: No cervical adenopathy.  Skin:    General: Skin is warm.     Capillary Refill: Capillary refill takes less than 2 seconds.     Findings: No rash.  Neurological:     General: No focal deficit present.     Mental Status: He is alert and oriented to person, place, and time.     Cranial Nerves: No cranial nerve deficit.     Deep Tendon Reflexes: Reflexes are normal and symmetric.     BP 100/72   Pulse 64   Ht 5\' 10"  (1.778 m)   Wt 155 lb (70.3 kg)   BMI 22.24 kg/m   Assessment and Plan:  1. Establishing care with new doctor, encounter for Patient establishing care with a new physician.  Discussed his history of parkinsonism, REM behavior disorder, and prostate hypertrophy.  Will return in 6 months and we will do full medical exam at that time.

## 2018-11-24 ENCOUNTER — Encounter: Payer: Self-pay | Admitting: Neurology

## 2018-11-29 ENCOUNTER — Ambulatory Visit: Payer: Medicare Other | Admitting: Neurology

## 2019-01-07 ENCOUNTER — Emergency Department
Admission: EM | Admit: 2019-01-07 | Discharge: 2019-01-07 | Disposition: A | Discharge status: Home / Self Care | Payer: Medicare Other | Attending: Emergency Medicine | Admitting: Emergency Medicine

## 2019-01-07 ENCOUNTER — Emergency Department: Payer: Medicare Other

## 2019-01-07 ENCOUNTER — Other Ambulatory Visit: Payer: Self-pay

## 2019-01-07 ENCOUNTER — Encounter: Payer: Self-pay | Admitting: Emergency Medicine

## 2019-01-07 DIAGNOSIS — G20 Parkinson's disease: Secondary | ICD-10-CM | POA: Diagnosis not present

## 2019-01-07 DIAGNOSIS — K5732 Diverticulitis of large intestine without perforation or abscess without bleeding: Secondary | ICD-10-CM | POA: Insufficient documentation

## 2019-01-07 DIAGNOSIS — Z87891 Personal history of nicotine dependence: Secondary | ICD-10-CM | POA: Diagnosis not present

## 2019-01-07 DIAGNOSIS — Z79899 Other long term (current) drug therapy: Secondary | ICD-10-CM | POA: Diagnosis not present

## 2019-01-07 DIAGNOSIS — K7689 Other specified diseases of liver: Secondary | ICD-10-CM | POA: Diagnosis not present

## 2019-01-07 DIAGNOSIS — R1032 Left lower quadrant pain: Secondary | ICD-10-CM | POA: Diagnosis not present

## 2019-01-07 HISTORY — DX: Parkinson's disease without dyskinesia, without mention of fluctuations: G20.A1

## 2019-01-07 HISTORY — DX: Parkinson's disease: G20

## 2019-01-07 LAB — URINALYSIS, COMPLETE (UACMP) WITH MICROSCOPIC
Bacteria, UA: NONE SEEN
Bilirubin Urine: NEGATIVE
Glucose, UA: NEGATIVE mg/dL
Hgb urine dipstick: NEGATIVE
Ketones, ur: NEGATIVE mg/dL
Leukocytes,Ua: NEGATIVE
Nitrite: NEGATIVE
Protein, ur: NEGATIVE mg/dL
Specific Gravity, Urine: 1.002 — ABNORMAL LOW (ref 1.005–1.030)
Squamous Epithelial / LPF: NONE SEEN (ref 0–5)
WBC, UA: NONE SEEN WBC/hpf (ref 0–5)
pH: 7 (ref 5.0–8.0)

## 2019-01-07 LAB — CBC WITH DIFFERENTIAL/PLATELET
Abs Immature Granulocytes: 0.05 10*3/uL (ref 0.00–0.07)
Basophils Absolute: 0 10*3/uL (ref 0.0–0.1)
Basophils Relative: 0 %
Eosinophils Absolute: 0.1 10*3/uL (ref 0.0–0.5)
Eosinophils Relative: 1 %
HCT: 45.7 % (ref 39.0–52.0)
Hemoglobin: 14.9 g/dL (ref 13.0–17.0)
Immature Granulocytes: 0 %
Lymphocytes Relative: 17 %
Lymphs Abs: 2.1 10*3/uL (ref 0.7–4.0)
MCH: 30.5 pg (ref 26.0–34.0)
MCHC: 32.6 g/dL (ref 30.0–36.0)
MCV: 93.6 fL (ref 80.0–100.0)
Monocytes Absolute: 1.2 10*3/uL — ABNORMAL HIGH (ref 0.1–1.0)
Monocytes Relative: 10 %
Neutro Abs: 8.8 10*3/uL — ABNORMAL HIGH (ref 1.7–7.7)
Neutrophils Relative %: 72 %
Platelets: 273 10*3/uL (ref 150–400)
RBC: 4.88 MIL/uL (ref 4.22–5.81)
RDW: 13 % (ref 11.5–15.5)
WBC: 12.2 10*3/uL — ABNORMAL HIGH (ref 4.0–10.5)
nRBC: 0 % (ref 0.0–0.2)

## 2019-01-07 LAB — COMPREHENSIVE METABOLIC PANEL
ALT: <5 U/L (ref 0–44)
AST: 22 U/L (ref 15–41)
Albumin: 4.1 g/dL (ref 3.5–5.0)
Alkaline Phosphatase: 68 U/L (ref 38–126)
Anion gap: 9 (ref 5–15)
BUN: 13 mg/dL (ref 8–23)
CO2: 26 mmol/L (ref 22–32)
Calcium: 9.1 mg/dL (ref 8.9–10.3)
Chloride: 100 mmol/L (ref 98–111)
Creatinine, Ser: 1.29 mg/dL — ABNORMAL HIGH (ref 0.61–1.24)
GFR calc Af Amer: >60 mL/min (ref >60)
GFR calc non Af Amer: 53 mL/min — ABNORMAL LOW (ref >60)
Glucose, Bld: 102 mg/dL — ABNORMAL HIGH (ref 70–99)
Potassium: 3.8 mmol/L (ref 3.5–5.1)
Sodium: 135 mmol/L (ref 135–145)
Total Bilirubin: 2.6 mg/dL — ABNORMAL HIGH (ref 0.3–1.2)
Total Protein: 7.5 g/dL (ref 6.5–8.1)

## 2019-01-07 LAB — LIPASE, BLOOD: Lipase: 35 U/L (ref 11–51)

## 2019-01-07 MED ORDER — AMOXICILLIN-POT CLAVULANATE 875-125 MG PO TABS
1.0000 | ORAL_TABLET | Freq: Once | ORAL | Status: AC
  Administered 2019-01-07: 1 via ORAL
  Filled 2019-01-07: qty 1

## 2019-01-07 MED ORDER — METRONIDAZOLE 500 MG PO TABS: 500.0000 mg | ORAL_TABLET | Freq: Three times a day (TID) | ORAL | 0 refills | Status: AC

## 2019-01-07 MED ORDER — METRONIDAZOLE 500 MG PO TABS
500.0000 mg | ORAL_TABLET | Freq: Once | ORAL | Status: AC
  Administered 2019-01-07: 500 mg via ORAL
  Filled 2019-01-07: qty 1

## 2019-01-07 MED ORDER — ONDANSETRON 4 MG PO TBDP: 4.0000 mg | ORAL_TABLET | Freq: Three times a day (TID) | ORAL | 0 refills | Status: DC | PRN

## 2019-01-07 MED ORDER — AMOXICILLIN-POT CLAVULANATE 875-125 MG PO TABS: 1.0000 | ORAL_TABLET | Freq: Two times a day (BID) | ORAL | 0 refills | Status: DC

## 2019-01-07 MED ORDER — OXYCODONE HCL 5 MG PO TABS: 5.0000 mg | ORAL_TABLET | Freq: Three times a day (TID) | ORAL | 0 refills | Status: DC | PRN

## 2019-01-07 NOTE — ED Triage Notes (Signed)
Pt to ED via POV c/o LLQ abdominal pain x 2 days. Pt states that he was thinks that he is constipated. Pt used OTC medication and was able to have a bowel movement but he is still having pain. Pt is in NAD .

## 2019-01-07 NOTE — ED Notes (Signed)
Patient transported to CT 

## 2019-01-07 NOTE — ED Provider Notes (Signed)
[facility] Emergency Department Provider Note  ____________________________________________  Time seen: Approximately 4:13 PM  I have reviewed the triage vital signs and the nursing notes.   HISTORY  Chief Complaint Abdominal Pain   HPI [name] [name] is a 78 y.o. male history of Parkinson's disease, BPH, kidney transplant donor, appendectomy, colonic polyps who presents for evaluation of abdominal pain.  Patient reports 2 days of dull left lower quadrant abdominal pain.  The pain is mild at rest but when he moves around the pain becomes moderate in intensity.  Patient initially felt it could be gas or constipation.  He took a Dulcolax and Gas-X.  He reports passing a lot of gas and having normal bowel movement with no changes in his pain.  No nausea or vomiting, no fever or chills, no dysuria or hematuria.  Patient denies ever having similar pain.  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 Authorizing Provider  amoxicillin-clavulanate (AUGMENTIN) 875-125 MG tablet Take 1 tablet by mouth 2 (two) times daily for 10 days. 01/07/19 01/17/19  Rudene Re, MD  carbidopa-levodopa (SINEMET IR) 25-100 MG tablet Take 2 tablets by mouth 3 (three) times  daily. Dr Anna Genre 10/27/17   [provider]  clonazePAM (KLONOPIN) 0.5 MG tablet Take 1 tablet by mouth at bedtime. Dr Anna Genre 09/09/18 03/08/19  [provider]  Glycerin-Hypromellose-PEG 400 (VISINE DRY EYE OP) Apply to eye. otc    [provider]  ibuprofen (ADVIL,MOTRIN) 600 MG tablet Take by mouth as needed. otc    [provider]  metroNIDAZOLE (FLAGYL) 500 MG tablet Take 1 tablet (500 mg total) by mouth 3 (three) times daily for 10 days. 01/07/19 01/17/19  Rudene Re, MD  ondansetron (ZOFRAN ODT) 4 MG disintegrating tablet Take 1 tablet (4 mg total) by mouth every 8 (eight) hours as needed. 01/07/19   Rudene Re, MD  oxyCODONE (ROXICODONE) 5 MG immediate release tablet Take 1 tablet (5 mg total) by mouth every 8 (eight) hours as needed. 01/07/19 01/07/20  Rudene Re, MD    Allergies Influenza vaccines  Family History  Problem Relation Age of Onset   Heart disease Maternal Grandfather    Diabetes Paternal Grandfather     Social History Social History   Tobacco Use   Smoking status: Former Smoker    Types: Cigarettes, Cigars    Last attempt to quit: 09/07/2010    Years since quitting: 8.3   Smokeless tobacco: Never Used  Substance Use Topics   Alcohol use: Yes    Comment: occasionally   Drug use: Never    Review of Systems  Constitutional: Negative for fever. Eyes: Negative for visual changes. ENT: Negative for sore throat. Neck: No neck pain  Cardiovascular: Negative for  chest pain. Respiratory: Negative for shortness of breath. Gastrointestinal: + LLQ abdominal pain. No vomiting or diarrhea. Genitourinary: Negative for dysuria. Musculoskeletal: Negative for back pain. Skin: Negative for rash. Neurological: Negative for headaches, weakness or numbness. Psych: No SI or HI  ____________________________________________   PHYSICAL EXAM:  VITAL SIGNS: ED Triage Vitals [01/07/19 1447]  Enc Vitals Group     BP  127/83     Pulse Rate (!) 57     Resp 16     Temp 97.6 F (36.4 C)     Temp Source Oral     SpO2 97 %     Weight 152 lb (68.9 kg)     Height 5\' 10"  (1.778 m)     Head Circumference      Peak Flow      Pain Score 2     Pain Loc      Pain Edu?      Excl. in [place]?     Constitutional: Alert and oriented. Well appearing and in no apparent distress. HEENT:      Head: Normocephalic and atraumatic.         Eyes: Conjunctivae are normal. Sclera is non-icteric.       Mouth/Throat: Mucous membranes are moist.       Neck: Supple with no signs of meningismus. Cardiovascular: Regular rate and rhythm. No murmurs, gallops, or rubs. 2+ symmetrical distal pulses are present in all extremities. No JVD. Respiratory: Normal respiratory effort. Lungs are clear to auscultation bilaterally. No wheezes, crackles, or rhonchi.  Gastrointestinal: Soft, tender to palpation over the LLQ, and non distended with positive bowel sounds. No rebound or guarding. Musculoskeletal: Nontender with normal range of motion in all extremities. No edema, cyanosis, or erythema of extremities. Neurologic: Normal speech and language. Face is symmetric. Moving all extremities. No gross focal neurologic deficits are appreciated. Skin: Skin is warm, dry and intact. No rash noted. Psychiatric: Mood and affect are normal. Speech and behavior are normal.  ____________________________________________   LABS (all labs ordered are listed, but only abnormal results are displayed)  Labs Reviewed  CBC WITH DIFFERENTIAL/PLATELET - Abnormal; Notable for the following components:      Result Value   WBC 12.2 (*)    Neutro Abs 8.8 (*)    Monocytes Absolute 1.2 (*)    All other components within normal limits  COMPREHENSIVE METABOLIC PANEL - Abnormal; Notable for the following components:   Glucose, Bld 102 (*)    Creatinine, Ser 1.29 (*)    Total Bilirubin 2.6 (*)    GFR calc non Af Amer 53 (*)    All other components within normal  limits  URINALYSIS, COMPLETE (UACMP) WITH MICROSCOPIC - Abnormal; Notable for the following components:   Color, Urine STRAW (*)    APPearance CLEAR (*)    Specific Gravity, Urine 1.002 (*)    All other components within normal limits  LIPASE, BLOOD   ____________________________________________  EKG  none  ____________________________________________  RADIOLOGY  I have personally reviewed the images performed during this visit and I agree with the Radiologist's read.   Interpretation by Radiologist:  Ct Abdomen Pelvis Wo Contrast  Result Date: 01/07/2019 CLINICAL DATA:  Left lower quadrant pain for 2 days. Left nephrectomy due to kidney donation. EXAM: CT ABDOMEN AND PELVIS WITHOUT CONTRAST TECHNIQUE: Multidetector CT imaging of the abdomen and pelvis was performed following the standard protocol without IV contrast. COMPARISON:  None. FINDINGS: Lower chest: No acute abnormality. Hepatobiliary: There is a cyst  in the liver on series 2, image 11. There may be a few other tiny cysts in the dome, not well assessed without contrast. The liver is otherwise normal. There appears to be a tiny stone at the neck of the gallbladder seen on coronal coronal image 34. The gallbladder is otherwise normal in appearance without distension. Pancreas: Unremarkable. No pancreatic ductal dilatation or surrounding inflammatory changes. Spleen: Normal in size without focal abnormality. Adrenals/Urinary Tract: The patient is status post left nephrectomy. Adrenal glands are normal. The right kidney is normal in appearance. The right ureter is normal. The bladder is unremarkable. Stomach/Bowel: The stomach and small bowel are normal. 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 There is pericolonic stranding adjacent to the colon at the junction of the distal descending colon and sigmoid colon, in the region of diverticular prevalent. The findings are  consistent with diverticulitis. No extraluminal gas or abscess noted. The colon is otherwise normal. The patient is status post appendectomy. Vascular/Lymphatic: There is atherosclerosis in the tortuous nonaneurysmal aorta and iliac vessels. No adenopathy. Reproductive: Prostate is unremarkable. Other: No abdominal wall hernia or abnormality. No abdominopelvic ascites. Musculoskeletal: No acute or significant osseous findings. IMPRESSION: 1. The findings are consistent with diverticulitis at the junction of the distal descending colon and the proximal sigmoid colon. Recommend follow-up to resolution. 2. There appears to be a tiny stone at the neck of the gallbladder without wall thickening or pericholecystic stranding. 3. Left nephrectomy. 4. Atherosclerotic change in the nonaneurysmal aorta. Electronically Signed   By: Dorise Bullion III M.D   On: 01/07/2019 16:04     ____________________________________________   PROCEDURES  Procedure(s) performed: None Procedures Critical Care performed:  None ____________________________________________   INITIAL IMPRESSION / ASSESSMENT AND PLAN / ED COURSE   78 y.o. male history of Parkinson's disease, BPH, kidney transplant donor, appendectomy, colonic polyps who presents for evaluation of LLQ abdominal pain x 2 days.  Patient is well-appearing and in no distress, normal vital signs, abdomen is soft with left lower quadrant tenderness, no rebound or guarding.  Presentation was concerning for diverticulitis which was confirmed on CT scan with no evidence of perforation or abscess.  CT also concerning for possible small stone at the neck of the gallbladder, patient has no right upper quadrant or epigastric tenderness, normal LFTs and lipase with no clinical signs of cholecystitis. Patient started on augmentin and flagyl. Will dc home on standard return precautions and f/u with PCP.      As part of my medical decision making, I reviewed the following data  within the [facility] notes reviewed and incorporated, Labs reviewed , Old chart reviewed, Radiograph reviewed , Notes from prior ED visits and [place] Controlled Substance Database    Pertinent labs & imaging results that were available during my care of the patient were reviewed by me and considered in my medical decision making (see chart for details).    ____________________________________________   FINAL CLINICAL IMPRESSION(S) / ED DIAGNOSES  Final diagnoses:  Diverticulitis large intestine w/o perforation or abscess w/o bleeding      NEW MEDICATIONS STARTED DURING THIS VISIT:  ED Discharge Orders         Ordered    oxyCODONE (ROXICODONE) 5 MG immediate release tablet  Every 8 hours PRN     01/07/19 1645    ondansetron (ZOFRAN ODT) 4 MG disintegrating tablet  Every 8 hours PRN     01/07/19 1645  amoxicillin-clavulanate (AUGMENTIN) 875-125 MG tablet  2 times daily     01/07/19 1645    metroNIDAZOLE (FLAGYL) 500 MG tablet  3 times daily     01/07/19 1645           Note:  This document was prepared using Dragon voice recognition software and may include unintentional dictation errors.    Alfred Levins, [place], MD 01/07/19 [phone]

## 2019-01-07 NOTE — Discharge Instructions (Signed)
Pain control: Take tylenol 1000mg every 8 hours. Take 5mg of oxycodone every 6 hours for breakthrough pain. If you need the oxycodone make sure to take one senokot as well to prevent constipation.  Do not drink alcohol, drive or participate in any other potentially dangerous activities while taking this medication as it may make you sleepy. Do not take this medication with any other sedating medications, either prescription or over-the-counter.  

## 2019-01-11 ENCOUNTER — Encounter: Payer: Self-pay | Admitting: Family Medicine

## 2019-01-11 ENCOUNTER — Other Ambulatory Visit: Payer: Self-pay

## 2019-01-11 ENCOUNTER — Ambulatory Visit (INDEPENDENT_AMBULATORY_CARE_PROVIDER_SITE_OTHER): Payer: Medicare Other | Admitting: Family Medicine

## 2019-01-11 VITALS — BP 118/82 | HR 60 | Ht 70.0 in | Wt 155.0 lb

## 2019-01-11 DIAGNOSIS — K5792 Diverticulitis of intestine, part unspecified, without perforation or abscess without bleeding: Secondary | ICD-10-CM

## 2019-01-11 DIAGNOSIS — E860 Dehydration: Secondary | ICD-10-CM | POA: Diagnosis not present

## 2019-01-11 MED ORDER — LEVOFLOXACIN 500 MG PO TABS: 500.0000 mg | ORAL_TABLET | Freq: Every day | ORAL | 0 refills | Status: DC

## 2019-01-11 NOTE — Patient Instructions (Signed)
Diverticulitis  Diverticulitis is infection or inflammation of small pouches (diverticula) in the colon that form due to a condition called diverticulosis. Diverticula can trap stool (feces) and bacteria, causing infection and inflammation. Diverticulitis may cause severe stomach pain and diarrhea. It may lead to tissue damage in the colon that causes bleeding. The diverticula may also burst (rupture) and cause infected stool to enter other areas of the abdomen. Complications of diverticulitis can include:  Bleeding.  Severe infection.  Severe pain.  Rupture (perforation) of the colon.  Blockage (obstruction) of the colon. What are the causes? This condition is caused by stool becoming trapped in the diverticula, which allows bacteria to grow in the diverticula. This leads to inflammation and infection. What increases the risk? You are more likely to develop this condition if:  You have diverticulosis. The risk for diverticulosis increases if: ? You are overweight or obese. ? You use tobacco products. ? You do not get enough exercise.  You eat a diet that does not include enough fiber. High-fiber foods include fruits, vegetables, beans, nuts, and whole grains. What are the signs or symptoms? Symptoms of this condition may include:  Pain and tenderness in the abdomen. The pain is normally located on the left side of the abdomen, but it may occur in other areas.  Fever and chills.  Bloating.  Cramping.  Nausea.  Vomiting.  Changes in bowel routines.  Blood in your stool. How is this diagnosed? This condition is diagnosed based on:  Your medical history.  A physical exam.  Tests to make sure there is nothing else causing your condition. These tests may include: ? Blood tests. ? Urine tests. ? Imaging tests of the abdomen, including X-rays, ultrasounds, MRIs, or CT scans. How is this treated? Most cases of this condition are mild and can be treated at home.  Treatment may include:  Taking over-the-counter pain medicines.  Following a clear liquid diet.  Taking antibiotic medicines by mouth.  Rest. More severe cases may need to be treated at a hospital. Treatment may include:  Not eating or drinking.  Taking prescription pain medicine.  Receiving antibiotic medicines through an IV tube.  Receiving fluids and nutrition through an IV tube.  Surgery. When your condition is under control, your health care provider may recommend that you have a colonoscopy. This is an exam to look at the entire large intestine. During the exam, a lubricated, bendable tube is inserted into the anus and then passed into the rectum, colon, and other parts of the large intestine. A colonoscopy can show how severe your diverticula are and whether something else may be causing your symptoms. Follow these instructions at home: Medicines  Take over-the-counter and prescription medicines only as told by your health care provider. These include fiber supplements, probiotics, and stool softeners.  If you were prescribed an antibiotic medicine, take it as told by your health care provider. Do not stop taking the antibiotic even if you start to feel better.  Do not drive or use heavy machinery while taking prescription pain medicine. General instructions   Follow a full liquid diet or another diet as directed by your health care provider. After your symptoms improve, your health care provider may tell you to change your diet. He or she may recommend that you eat a diet that contains at least 25 g (25 grams) of fiber daily. Fiber makes it easier to pass stool. Healthy sources of fiber include: ? Berries. One cup contains 4-8 grams of   fiber. ? Beans or lentils. One half cup contains 5-8 grams of fiber. ? Green vegetables. One cup contains 4 grams of fiber.  Exercise for at least 30 minutes, 3 times each week. You should exercise hard enough to raise your heart rate and  break a sweat.  Keep all follow-up visits as told by your health care provider. This is important. You may need a colonoscopy. Contact a health care provider if:  Your pain does not improve.  You have a hard time drinking or eating food.  Your bowel movements do not return to normal. Get help right away if:  Your pain gets worse.  Your symptoms do not get better with treatment.  Your symptoms suddenly get worse.  You have a fever.  You vomit more than one time.  You have stools that are bloody, black, or tarry. Summary  Diverticulitis is infection or inflammation of small pouches (diverticula) in the colon that form due to a condition called diverticulosis. Diverticula can trap stool (feces) and bacteria, causing infection and inflammation.  You are at higher risk for this condition if you have diverticulosis and you eat a diet that does not include enough fiber.  Most cases of this condition are mild and can be treated at home. More severe cases may need to be treated at a hospital.  When your condition is under control, your health care provider may recommend that you have an exam called a colonoscopy. This exam can show how severe your diverticula are and whether something else may be causing your symptoms. This information is not intended to replace advice given to you by your health care provider. Make sure you discuss any questions you have with your health care provider. Document Released: 06/03/2005 Document Revised: 09/26/2016 Document Reviewed: 09/26/2016 Elsevier Interactive Patient Education  2019 [employer]vier Inc[employer]  

## 2019-01-11 NOTE — Progress Notes (Signed)
Date:  01/11/2019   Name:  [name] [name]   DOB:  04/22/1941   MRN:  [id number]   Chief Complaint: Follow-up (went to ER on Sat 5/2- was dx with diverticulitis- put on antibiotics- feels better, just tired)  Abdominal Pain  This is a new problem. The current episode started in the past 7 days. The onset quality is sudden. The problem occurs constantly. The problem has been waxing and waning. The pain is located in the LLQ. The pain is at a severity of 0/10. The patient is experiencing no pain. The abdominal pain does not radiate. Associated symptoms include diarrhea and nausea. Pertinent negatives include no constipation, dysuria, fever, frequency, headaches, hematochezia, hematuria, myalgias, vomiting or weight loss. Exacerbated by: palpation. Relieved by: augmentin/flagyl. He has tried antibiotics for the symptoms. The treatment provided moderate relief. Prior diagnostic workup includes CT scan. diverticulosis    Review of Systems  Constitutional: Negative for chills, fever and weight loss.  HENT: Negative for drooling, ear discharge, ear pain and sore throat.   Respiratory: Negative for cough, shortness of breath and wheezing.   Cardiovascular: Negative for chest pain, palpitations and leg swelling.  Gastrointestinal: Positive for abdominal pain, diarrhea and nausea. Negative for anal bleeding, blood in stool, constipation, hematochezia, rectal pain and vomiting.  Endocrine: Negative for polydipsia.  Genitourinary: Negative for dysuria, frequency, hematuria and urgency.  Musculoskeletal: Negative for back pain, myalgias and neck pain.  Skin: Negative for rash.  Allergic/Immunologic: Negative for environmental allergies.  Neurological: Negative for dizziness and headaches.  Hematological: Does not bruise/bleed easily.  Psychiatric/Behavioral: Negative for suicidal ideas. The patient is not nervous/anxious.     Patient Active Problem List   Diagnosis Date Noted  . Establishing care  with new doctor, encounter for 09/26/2018  . 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 Parkinson's disease ([place]) 11/07/2015  . 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 Hx of colonic polyps 05/18/2014  . BPH (benign prostatic hyperplasia) 01/12/2014  . Obstructive sleep apnea (adult) (pediatric) 01/17/2012  . 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 Influenza Vaccines Anaphylaxis    Flu shot: 1974 anaphylaxis. 2nd time swollen arm Flu shot: 1974 anaphylaxis. 2nd time swollen arm     Past Surgical History:  Procedure Laterality Date  . APPENDECTOMY    . 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 Smoking status: Former Smoker    Types: Cigarettes, Cigars    Last attempt to quit: 09/07/2010    Years since quitting: 8.3  . Smokeless tobacco: Never Used  Substance Use Topics  . Alcohol use: Yes    Comment: occasionally  . Drug use: Never     Medication list has been reviewed and updated.  Current Meds  Medication Sig  . amoxicillin-clavulanate (AUGMENTIN) 875-125 MG tablet Take 1 tablet by mouth 2 (two) times daily for 10 days.  . carbidopa-levodopa (SINEMET IR) 25-100 MG tablet Take 2 tablets by mouth 3 (three) times daily. Dr Anna Genre  . clonazePAM (KLONOPIN) 0.5 MG tablet Take 1 tablet by mouth at bedtime. Dr Anna Genre  . Glycerin-Hypromellose-PEG 400 (VISINE DRY EYE OP) Apply to eye. otc  . ibuprofen (ADVIL,MOTRIN) 600 MG tablet Take by mouth as needed. otc  . metroNIDAZOLE (FLAGYL) 500 MG tablet Take 1 tablet (500 mg total) by mouth 3 (three) times daily for 10 days.    PHQ 2/9 Scores 01/11/2019 09/26/2018  PHQ - 2 Score 0 0  PHQ- 9 Score 0 0    BP Readings from Last 3 Encounters:  01/11/19 118/82  01/07/19 (!) 136/92  09/26/18 100/72    Physical Exam Vitals signs and nursing note reviewed.  HENT:     Head: Normocephalic.     Right Ear: Tympanic membrane, ear canal and external  ear normal.     Left Ear: Tympanic membrane, ear canal and external ear normal.     Nose: Nose normal.     Mouth/Throat:     Mouth: Mucous membranes are dry.     Pharynx: No oropharyngeal exudate or posterior oropharyngeal erythema.  Eyes:     General: No scleral icterus.       Right eye: No discharge.        Left eye: No discharge.     Conjunctiva/sclera: Conjunctivae normal.     Pupils: Pupils are equal, round, and reactive to light.  Neck:     Musculoskeletal: Normal range of motion and neck supple.     Thyroid: No thyromegaly.     Vascular: No JVD.     Trachea: No tracheal deviation.  Cardiovascular:     Rate and Rhythm: Normal rate and regular rhythm.     Heart sounds: Normal heart sounds. No murmur. No friction rub. No gallop.   Pulmonary:     Effort: No respiratory distress.     Breath sounds: Normal breath sounds. No wheezing or rales.  Abdominal:     General: Abdomen is flat. Bowel sounds are normal. There is no distension.     Palpations: Abdomen is soft. There is no hepatomegaly, splenomegaly or mass.     Tenderness: There is no abdominal tenderness. There is no guarding or rebound.  Musculoskeletal: Normal range of motion.        General: No tenderness.  Lymphadenopathy:     Cervical: No cervical adenopathy.  Skin:    General: Skin is warm.     Findings: No rash.  Neurological:     Mental Status: He is alert and oriented to person, place, and time.     Cranial Nerves: No cranial nerve deficit.     Deep Tendon Reflexes: Reflexes are normal and symmetric.     Wt Readings from Last 3 Encounters:  01/11/19 155 lb (70.3 kg)  01/07/19 152 lb (68.9 kg)  09/26/18 155 lb (70.3 kg)    BP 118/82   Pulse 60   Ht 5\' 10"  (1.778 m)   Wt 155 lb (70.3 kg)   BMI 22.24 kg/m   Assessment and Plan: 1. Diverticulitis Patient has evidence of diverticulosis on colonoscopy.  Patient had an acute flareup of diverticulitis which was initially evaluated in the emergency room  and treated with Augmentin and metronidazole.  Patient has had reduction of his symptoms of pain and seems to be doing better however patient has had 2 episodes of diarrhea.  This is likely due to the Augmentin and therefore we will need to discontinue the Augmentin and replace it with Levaquin 500 mg once a day for 7 days.  Patient was instructed to return to clinic or contact us if the diarrhea continues in case we need to evaluate for C. difficile.  We will however check a CBC to make sure that the 12,200 white blood cell count is decreasing. - levofloxacin (LEVAQUIN) 500 MG tablet; Take 1 tablet (500 mg total) by mouth daily.  Dispense: 7 tablet; Refill: 0 - CBC with Differential/Platelet  2. Dehydration Patient was noted to be dehydrated and will at this time encourage fluids.

## 2019-01-12 LAB — CBC WITH DIFFERENTIAL/PLATELET
Basophils Absolute: 0.1 10*3/uL (ref 0.0–0.2)
Basos: 1 %
EOS (ABSOLUTE): 0.1 10*3/uL (ref 0.0–0.4)
Eos: 2 %
Hematocrit: 46.6 % (ref 37.5–51.0)
Hemoglobin: 15.7 g/dL (ref 13.0–17.7)
Immature Grans (Abs): 0.1 10*3/uL (ref 0.0–0.1)
Immature Granulocytes: 1 %
Lymphocytes Absolute: 1.7 10*3/uL (ref 0.7–3.1)
Lymphs: 26 %
MCH: 31.1 pg (ref 26.6–33.0)
MCHC: 33.7 g/dL (ref 31.5–35.7)
MCV: 92 fL (ref 79–97)
Monocytes Absolute: 0.8 10*3/uL (ref 0.1–0.9)
Monocytes: 12 %
Neutrophils Absolute: 3.9 10*3/uL (ref 1.4–7.0)
Neutrophils: 58 %
Platelets: 317 10*3/uL (ref 150–450)
RBC: 5.05 x10E6/uL (ref 4.14–5.80)
RDW: 12.7 % (ref 11.6–15.4)
WBC: 6.6 10*3/uL (ref 3.4–10.8)

## 2019-01-24 ENCOUNTER — Ambulatory Visit: Payer: Medicare Other | Admitting: Neurology

## 2019-02-02 NOTE — Progress Notes (Signed)
[name] [name] was seen today in the movement disorders clinic for neurologic consultation at the request of Baxter Hire, MD.  The consultation is for the evaluation of Parkinson's disease.  Patient was previously taken care of at [facility] by Dr. Maxine Glenn and then Dr. Anna Genre.  I have reviewed numerous records made available to me.  Patient first saw Dr. Maxine Glenn on November 06, 2015.  He presented with a long history of REM behavior disorder and dragging of the right foot.  He also had some tremor in the right side of the body.  He was diagnosed with Parkinson's disease at that first visit in March, 2017 and started on Azilect.  He began to see Dr. Anna Genre in January, 2018.  Carbidopa/levodopa 25/100 was started at that visit, 1 tablet 3 times per day.  He initially had nausea with that, so took it very inconsistently.  By February, 2019 he was still only taking it twice per day.  He was told in February, 2019 to take 2 tablets 2-3 times per day.  He was last seen at [facility] by the physician assistant on May 24, 2018.  He is currently taking carbidopa/levodopa 25/100, 2 tablets at 8am/1pm/6pm.  He has long been on clonazepam, 0.5 mg at night for REM behavior disorder.  He also has a history of sleep apnea and wears bipap.   Specific Symptoms:  Tremor: Yes.  , it comes and goes in the R hand and R leg (notes it in the R leg when trying to concentrate on handwriting) Family hx of similar:  No. Voice: wife tells him its gotten some weaker Sleep: sleeping  Vivid Dreams:  Yes.  , but does well with klonopin  Acting out dreams:  Yes.  , but does well with klonopin and "only jumps out of bed" rarely - 1 time a month may kick Wet Pillows: No. Postural symptoms:  No.  Falls?  No. Bradykinesia symptoms: shuffling gait Loss of smell:  Yes.   Loss of taste:  No. Urinary Incontinence:  No. but has frequency at night Difficulty Swallowing:  No. Handwriting, micrographia: No. Trouble with ADL's:  No.  Trouble buttoning clothing: Yes.   (can do it but is slow) Depression:  Yes.  , "just a little because my wife is not well and I take care of her." Memory changes:  No. Hallucinations:  No.  visual distortions: No. N/V:  No. Lightheaded:  No.  Syncope: No. Diplopia:  No. Dyskinesia:  No. Prior exposure to reglan/antipsychotics: No.  Neuroimaging of the brain has not previously been performed.    PREVIOUS MEDICATIONS: Sinemet; azilect  ALLERGIES:   Allergies  Allergen Reactions  . Influenza Vaccines Anaphylaxis    Flu shot: 1974 anaphylaxis. 2nd time swollen arm Flu shot: 1974 anaphylaxis. 2nd time swollen arm     CURRENT MEDICATIONS:  Outpatient Encounter Medications as of 02/06/2019  Medication Sig  . carbidopa-levodopa (SINEMET IR) 25-100 MG tablet Take 2 tablets by mouth 3 (three) times daily. Dr Anna Genre  . clonazePAM (KLONOPIN) 0.5 MG tablet Take 1 tablet by mouth at bedtime. Dr Anna Genre  . Glycerin-Hypromellose-PEG 400 (VISINE DRY EYE OP) Apply to eye. otc  . ibuprofen (ADVIL,MOTRIN) 600 MG tablet Take by mouth as needed. otc  . [DISCONTINUED] levofloxacin (LEVAQUIN) 500 MG tablet Take 1 tablet (500 mg total) by mouth daily. (Patient not taking: Reported on 02/06/2019)  . [DISCONTINUED] ondansetron (ZOFRAN ODT) 4 MG disintegrating tablet Take 1 tablet (4 mg total) by mouth  every 8 (eight) hours as needed. (Patient not taking: Reported on 01/11/2019)  . [DISCONTINUED] oxyCODONE (ROXICODONE) 5 MG immediate release tablet Take 1 tablet (5 mg total) by mouth every 8 (eight) hours as needed. (Patient not taking: Reported on 01/11/2019)   No facility-administered encounter medications on file as of 02/06/2019.     PAST MEDICAL HISTORY:   Past Medical History:  Diagnosis Date  . Parkinson's disease ([place])   . REM sleep behavior disorder   . Sleep apnea     PAST SURGICAL HISTORY:   Past Surgical History:  Procedure Laterality Date  . APPENDECTOMY    . 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     SOCIAL HISTORY:   Social History   Socioeconomic History  . Marital status: Married    Spouse name: Not on file  . Number of children: Not on file  . Years of education: Not on file  . Highest education level: Not on file  Occupational History  . Occupation: retired    Comment: Nutritional therapist  Social Needs  . Financial resource strain: Not on file  . Food insecurity:    Worry: Not on file    Inability: Not on file  . Transportation needs:    Medical: Not on file    Non-medical: Not on file  Tobacco Use  . Smoking status: Former Smoker    Types: Cigarettes, Cigars    Last attempt to quit: 09/07/2010    Years since quitting: 8.4  . Smokeless tobacco: Never Used  Substance and Sexual Activity  . Alcohol use: Yes    Comment: 1 can beer/day  . Drug use: Never  . Sexual activity: Not Currently  Lifestyle  . Physical activity:    Days per week: Not on file    Minutes per session: Not on file  . Stress: Not on file  Relationships  . Social connections:    Talks on phone: Not on file    Gets together: Not on file    Attends religious service: Not on file    Active member of club or organization: Not on file    Attends meetings of clubs or organizations: Not on file    Relationship status: Not on file  . Intimate partner violence:    Fear of current or ex partner: Not on file    Emotionally abused: Not on file    Physically abused: Not on file    Forced sexual activity: Not on file  Other Topics Concern  . Not on file  Social History Narrative  . Not on file    FAMILY HISTORY:   Family Status  Relation Name Status  . MGF  (Not Specified)  . PGF  (Not Specified)  . Mother  Deceased  . Father  Deceased  . Brother  Alive  . Daughter  Alive  . Son  Alive    ROS:  Review of Systems  Constitutional: Negative.   HENT: Negative.   Eyes: Negative.   Cardiovascular: Negative.   Gastrointestinal: Negative.   Genitourinary: Negative.   Musculoskeletal:  Negative.   Skin: Negative.   Endo/Heme/Allergies: Negative.     PHYSICAL EXAMINATION:    VITALS:   Vitals:   02/06/19 1254  BP: 128/78  Pulse: (!) 54  Temp: 98.2 F (36.8 C)  SpO2: 95%  Weight: 156 lb (70.8 kg)  Height: 5\' 10"  (1.778 m)    GEN:  The patient appears stated age and is in NAD. HEENT:  Normocephalic, atraumatic.  The  mucous membranes are moist. The superficial temporal arteries are without ropiness or tenderness. CV:  Loletha Grayer.  Regular Lungs:  CTAB Neck/HEME:  There are no carotid bruits bilaterally.  Neurological examination:  Orientation: The patient is alert and oriented x3. Fund of knowledge is appropriate.  Recent and remote memory are intact.  Attention and concentration are normal.    Able to name objects and repeat phrases. Cranial nerves: There is good facial symmetry. Pupils are equal round and reactive to light bilaterally. Fundoscopic exam reveals clear margins bilaterally. Extraocular muscles are intact. The visual fields are full to confrontational testing. The speech is fluent and clear. Soft palate rises symmetrically and there is no tongue deviation. Hearing is intact to conversational tone. Sensation: Sensation is intact to light and pinprick throughout (facial, trunk, extremities). Vibration is decreased at the bilateral big toe. There is no extinction with double simultaneous stimulation. There is no sensory dermatomal level identified. Motor: Strength is 5/5 in the bilateral upper and lower extremities.   Shoulder shrug is equal and symmetric.  There is no pronator drift. Deep tendon reflexes: Deep tendon reflexes are 3/4 at the bilateral biceps, triceps, brachioradialis, 3+ patella and achilles. Plantar responses are downgoing bilaterally.  Movement examination: Tone: There is mod increased tone in the RUE.  Tone elsewhere is normal Abnormal movements: There is RLE rest tremor and more rare right upper extremity rest tremor Coordination:  There is  decremation with RAM's, with any form of RAMS, including alternating supination and pronation of the forearm, hand opening and closing, finger taps, heel taps and toe taps on the right. Gait and Station: The patient has no difficulty arising out of a deep-seated chair without the use of the hands. The patient's stride length is good.  The patient has a neg pull test.      ASSESSMENT/PLAN:  1.   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  The patient has tremor, bradykinesia, rigidity and mild postural instability.  -We discussed the diagnosis as well as pathophysiology of the disease.  We discussed treatment options as well as prognostic indicators.  Patient education was provided.  -We discussed that it used to be thought that levodopa would increase risk of melanoma but now it is believed that Parkinsons itself likely increases risk of melanoma. he is to get regular skin checks.  -Greater than 50% of the 60 minute visit was spent in counseling answering questions and talking about what to expect now as well as in the future.  We talked about medication options as well as potential future surgical options.  We talked about safety in the home.  -We decided to increase carbidopa/levodopa 25/100, to 2 tablets at 7 AM/1 tablet at 11 AM/2 tablets at 3 PM/1 tablet at 7 PM.  -I will refer the patient to the Parkinson's program at the [facility], for PT/OT and ST.  We talked about the importance of safe, cardiovascular exercise in Parkinson's disease.  -We discussed community resources in the area including patient support groups and community exercise programs for PD and pt education was provided to the patient.  2.  REM behavior disorder  -This is commonly associated with PD and the patient is experiencing this.  We discussed that this can be very serious and even harmful.  We talked about medications as well as physical barriers to put in the bed (particularly soft bed  rails, pillow barriers).  We talked about moving the night stand so that it is not so close  to the side of the bed.  Patient has been on clonazepam for quite some time and is doing well with this.  He will continue the clonazepam, 0.5 mg at night.  3.  Obstructive sleep apnea syndrome.  -We discussed morbidity and mortality associated with untreated sleep apnea.  The patient expressed understanding.  He is on bipap.  4.  Hyperreflexia  -denies neck or back pain and otherwise has no s/s of myelopathy.  Will monitor and pt will let me know if sx's change  5.  Follow up is anticipated in the next 5-6 months, sooner should new neurologic issues arise.   Time above did not include the 40 min of record review which was detailed above, which was non face to face time.   Cc:  Juline Patch, MD '

## 2019-02-06 ENCOUNTER — Ambulatory Visit (INDEPENDENT_AMBULATORY_CARE_PROVIDER_SITE_OTHER): Payer: Medicare Other | Admitting: Neurology

## 2019-02-06 ENCOUNTER — Other Ambulatory Visit: Payer: Self-pay

## 2019-02-06 ENCOUNTER — Encounter: Payer: Self-pay | Admitting: Neurology

## 2019-02-06 VITALS — BP 128/78 | HR 54 | Temp 98.2°F | Ht 70.0 in | Wt 156.0 lb

## 2019-02-06 DIAGNOSIS — R292 Abnormal reflex: Secondary | ICD-10-CM

## 2019-02-06 DIAGNOSIS — G20 Parkinson's disease: Secondary | ICD-10-CM

## 2019-02-06 DIAGNOSIS — G4752 REM sleep behavior disorder: Secondary | ICD-10-CM | POA: Diagnosis not present

## 2019-02-06 DIAGNOSIS — G4733 Obstructive sleep apnea (adult) (pediatric): Secondary | ICD-10-CM

## 2019-02-06 NOTE — Patient Instructions (Addendum)
-  carbidopa-levodopa 25/100 2 tablets at 7AM, 1 tablet at 11AM, 2 tablets at 3PM, 1 tablet at [place] have been referred to [facility] for therapy. They will call you directly to schedule an appointment.  Please call [phone] if you do not hear from them.

## 2019-02-07 ENCOUNTER — Telehealth: Payer: Self-pay

## 2019-02-07 DIAGNOSIS — G20 Parkinson's disease: Secondary | ICD-10-CM

## 2019-02-07 NOTE — Telephone Encounter (Signed)
-----   Message from [place], DO sent at 02/06/2019  4:19 PM EDT ----- [place],   Can you please make sure that the referral reflects the services needed?  thanks ----- Message ----- From: Frazier Butt, PT Sent: 02/06/2019   4:02 PM EDT To: Eustace Quail Tat, DO  I see it when I look at his chart, but it just says [facility].  Can you please make sure it says PT, OT, speech?  Thank you.  Amy ----- Message ----- From: Ludwig Clarks, DO Sent: 02/06/2019   1:39 PM EDT To: Frazier Butt, PT  Will you look for this referral for PT/OT/ST?  thanks

## 2019-02-15 ENCOUNTER — Telehealth: Payer: Self-pay | Admitting: Neurology

## 2019-02-15 NOTE — Telephone Encounter (Signed)
Pt declined rehab.

## 2019-02-15 NOTE — Telephone Encounter (Signed)
-----   Message from Frazier Butt, PT sent at 02/15/2019  8:13 AM EDT ----- Good morning, Dr. Nani Gasser wanted you to know that when our office called Mr. Keena, he declined scheduling therapy evals at this time.  I called and spoke with him yesterday, and he stated no significant functional changes,and he wanted to wait to come into his scheduled screens, which are 04/06/2019.  I let him know that you had placed the referrals and they would still be good at the time of his screens, should we feel therapy is necessary at that time.  Thank you, Mady Haagensen, PT ----- Message ----- From: Ludwig Clarks, DO Sent: 02/06/2019   4:19 PM EDT To: Frazier Butt, PT, Ranae Plumber, CMA  [place],   Can you please make sure that the referral reflects the services needed?  thanks ----- Message ----- From: Frazier Butt, PT Sent: 02/06/2019   4:02 PM EDT To: Eustace Quail Selassie Spatafore, DO  I see it when I look at his chart, but it just says Neurorehab.  Can you please make sure it says PT, OT, speech?  Thank you.  Amy ----- Message ----- From: Ludwig Clarks, DO Sent: 02/06/2019   1:39 PM EDT To: Frazier Butt, PT  Will you look for this referral for PT/OT/ST?  thanks

## 2019-03-06 DIAGNOSIS — G4752 REM sleep behavior disorder: Secondary | ICD-10-CM | POA: Diagnosis not present

## 2019-03-06 DIAGNOSIS — Z9989 Dependence on other enabling machines and devices: Secondary | ICD-10-CM | POA: Diagnosis not present

## 2019-03-06 DIAGNOSIS — Z79899 Other long term (current) drug therapy: Secondary | ICD-10-CM | POA: Diagnosis not present

## 2019-03-06 DIAGNOSIS — G4733 Obstructive sleep apnea (adult) (pediatric): Secondary | ICD-10-CM | POA: Diagnosis not present

## 2019-03-16 ENCOUNTER — Other Ambulatory Visit: Payer: Self-pay

## 2019-03-16 ENCOUNTER — Encounter: Payer: Self-pay | Admitting: Family Medicine

## 2019-03-16 ENCOUNTER — Ambulatory Visit (INDEPENDENT_AMBULATORY_CARE_PROVIDER_SITE_OTHER): Payer: Medicare Other | Admitting: Family Medicine

## 2019-03-16 VITALS — BP 110/70 | HR 60 | Ht 70.0 in | Wt 156.0 lb

## 2019-03-16 DIAGNOSIS — N400 Enlarged prostate without lower urinary tract symptoms: Secondary | ICD-10-CM | POA: Diagnosis not present

## 2019-03-16 NOTE — Progress Notes (Signed)
Date:  03/16/2019   Name:  [name] [name]   DOB:  June 23, 1941   MRN:  [id number]   Chief Complaint: Follow-up (sees neurology for carb- lev)  Benign Prostatic Hypertrophy This is a recurrent problem. The problem has been gradually worsening since onset. Irritative symptoms include frequency and nocturia. Irritative symptoms do not include urgency. Obstructive symptoms include dribbling, a slower stream, straining and a weak stream. Obstructive symptoms do not include incomplete emptying or an intermittent stream. Associated symptoms include hesitancy. Pertinent negatives include no chills, dysuria, genital pain, hematuria, nausea or vomiting. The treatment provided mild relief.    Review of Systems  Constitutional: Negative for chills and fever.  HENT: Negative for drooling, ear discharge, ear pain and sore throat.   Respiratory: Negative for cough, shortness of breath and wheezing.   Cardiovascular: Negative for chest pain, palpitations and leg swelling.  Gastrointestinal: Negative for abdominal pain, blood in stool, constipation, diarrhea, nausea and vomiting.  Endocrine: Negative for polydipsia.  Genitourinary: Positive for difficulty urinating, frequency, hesitancy and nocturia. Negative for dysuria, flank pain, hematuria, incomplete emptying and urgency.  Musculoskeletal: Negative for back pain, myalgias and neck pain.  Skin: Negative for rash.  Allergic/Immunologic: Negative for environmental allergies.  Neurological: Negative for dizziness and headaches.  Hematological: Does not bruise/bleed easily.  Psychiatric/Behavioral: Negative for suicidal ideas. The patient is not nervous/anxious.     Patient Active Problem List   Diagnosis Date Noted  . Establishing care with new doctor, encounter for 09/26/2018  . 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 Hx of colonic  polyps 05/18/2014  . BPH (benign prostatic hyperplasia) 01/12/2014  . Obstructive sleep apnea (adult) (pediatric) 01/17/2012  . 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 Influenza Vaccines Anaphylaxis    Flu shot: 1974 anaphylaxis. 2nd time swollen arm Flu shot: 1974 anaphylaxis. 2nd time swollen arm     Past Surgical History:  Procedure Laterality Date  . APPENDECTOMY    . 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 Smoking status: Former Smoker    Types: Cigarettes, Cigars    Quit date: 09/07/2010    Years since quitting: 8.5  . Smokeless tobacco: Never Used  Substance Use Topics  . Alcohol use: Yes    Comment: 1 can beer/day  . Drug use: Never     Medication list has been reviewed and updated.  Current Meds  Medication Sig  . carbidopa-levodopa (SINEMET IR) 25-100 MG tablet Take 2 tablets by mouth 3 (three) times daily. TAKE 2 tablets at 7AM, 1 tablet at 11AM, 2 tablets at 3PM, 1 tablet at 7PM  . clonazePAM (KLONOPIN) 0.5 MG tablet Take 1 tablet by mouth at bedtime. Dr Anna Genre  . Glycerin-Hypromellose-PEG 400 (VISINE DRY EYE OP) Apply to eye. otc  . ibuprofen (ADVIL,MOTRIN) 600 MG tablet Take by mouth as needed. otc    PHQ 2/9 Scores 03/16/2019 01/11/2019 09/26/2018  PHQ - 2 Score 0 0 0  PHQ- 9 Score 0 0 0    BP Readings from Last 3 Encounters:  03/16/19 110/70  02/06/19 128/78  01/11/19 118/82    Physical Exam Vitals signs and nursing note reviewed.  HENT:     Head: Normocephalic.     Right Ear: External ear normal.     Left Ear: External ear normal.  Nose: Nose normal.  Eyes:     General: No scleral icterus.       Right eye: No discharge.        Left eye: No discharge.     Conjunctiva/sclera: Conjunctivae normal.     Pupils: Pupils are equal, round, and reactive to light.  Neck:     Musculoskeletal: Normal range of motion and neck supple.     Thyroid: No thyromegaly.     Vascular: No JVD.      Trachea: No tracheal deviation.  Cardiovascular:     Rate and Rhythm: Normal rate and regular rhythm.     Heart sounds: Normal heart sounds. No murmur. No friction rub. No gallop.   Pulmonary:     Effort: No respiratory distress.     Breath sounds: Normal breath sounds. No wheezing or rales.  Abdominal:     General: Bowel sounds are normal.     Palpations: Abdomen is soft. There is no mass.     Tenderness: There is no abdominal tenderness. There is no guarding or rebound.  Genitourinary:    Prostate: Enlarged, tender and nodules present.     Rectum: Guaiac result negative. No mass.  Musculoskeletal: Normal range of motion.        General: No tenderness.  Lymphadenopathy:     Cervical: No cervical adenopathy.  Skin:    General: Skin is warm.     Findings: No rash.  Neurological:     Mental Status: He is alert and oriented to person, place, and time.     Cranial Nerves: No cranial nerve deficit.     Deep Tendon Reflexes: Reflexes are normal and symmetric.     Wt Readings from Last 3 Encounters:  03/16/19 156 lb (70.8 kg)  02/06/19 156 lb (70.8 kg)  01/11/19 155 lb (70.3 kg)    BP 110/70   Pulse 60   Ht 5\' 10"  (1.778 m)   Wt 156 lb (70.8 kg)   BMI 22.38 kg/m   Assessment and Plan:  1. Enlarged prostate Patient has apparently had a laser TU RP in [place] but 2 to 3 years ago.  Over the past 6 months he is increasingly developing the same symptoms which led to this including hesitancy and decreased stream and feeling like he needs to void after a urination this evening more urine is present.  On DRE patient has an enlarged very firm irregular almost nodular prostate noted.  There is no mention of prostate cancer in the notes and I am not certain what is going on we will obtain a PSA will refer to urology for concern of prostate malignancy. - Ambulatory referral to Urology - PSA

## 2019-03-17 LAB — PSA: Prostate Specific Ag, Serum: 26.5 ng/mL — ABNORMAL HIGH (ref 0.0–4.0)

## 2019-03-17 MED ORDER — TAMSULOSIN HCL 0.4 MG PO CAPS: 0.4000 mg | ORAL_CAPSULE | Freq: Every day | ORAL | 0 refills | Status: DC

## 2019-03-17 NOTE — Addendum Note (Signed)
Addended by: Fredderick Severance on: 03/17/2019 11:41 AM   Modules accepted: Orders

## 2019-03-20 ENCOUNTER — Encounter: Payer: Self-pay | Admitting: Urology

## 2019-03-20 ENCOUNTER — Ambulatory Visit (INDEPENDENT_AMBULATORY_CARE_PROVIDER_SITE_OTHER): Payer: Medicare Other | Admitting: Urology

## 2019-03-20 ENCOUNTER — Other Ambulatory Visit: Payer: Self-pay

## 2019-03-20 VITALS — BP 112/73 | HR 64 | Ht 69.0 in | Wt 154.0 lb

## 2019-03-20 DIAGNOSIS — N400 Enlarged prostate without lower urinary tract symptoms: Secondary | ICD-10-CM | POA: Diagnosis not present

## 2019-03-20 LAB — BLADDER SCAN AMB NON-IMAGING

## 2019-03-20 LAB — MICROSCOPIC EXAMINATION
Bacteria, UA: NONE SEEN
Epithelial Cells (non renal): NONE SEEN /hpf (ref 0–10)
RBC, Urine: NONE SEEN /hpf (ref 0–2)
WBC, UA: NONE SEEN /hpf (ref 0–5)

## 2019-03-20 LAB — URINALYSIS, COMPLETE
Bilirubin, UA: NEGATIVE
Glucose, UA: NEGATIVE
Ketones, UA: NEGATIVE
Leukocytes,UA: NEGATIVE
Nitrite, UA: NEGATIVE
Protein,UA: NEGATIVE
RBC, UA: NEGATIVE
Specific Gravity, UA: <1.005 — ABNORMAL LOW (ref 1.005–1.030)
Urobilinogen, Ur: 0.2 mg/dL (ref 0.2–1.0)
pH, UA: 6 (ref 5.0–7.5)

## 2019-03-20 NOTE — Progress Notes (Signed)
03/20/19 3:32 PM   [name] [birth year] [id number]  Referring provider: Juline Patch, MD [place],  [place] [address]  CC: Elevated PSA  HPI: I saw Mr. Nicklaus in urology clinic today in consultation from Dr. Ronnald Ramp for elevated PSA of 26.5.  He is a 78 year old male with past medical history notable for well-controlled Parkinson's, as well as PVP in 2017 in [place] for obstructive urinary symptoms of weak stream and feeling of incomplete emptying who recently presented to his PCP with worsening urinary symptoms at which point a PSA was checked and was grossly elevated at 26.5.  He denies any weight loss or bone pain.  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  He denies a family history of prostate cancer, however there is a history of breast cancer in his daughter.  He denies any weight loss or bone pain.  There are no aggravating or alleviating factors.  Regarding his urinary symptoms, he is minimally bothered by his urinary symptoms of intermittency and weak stream.  IPSS score today is 16, with quality of life mostly satisfied.  PVR 65 cc.  Urinalysis is benign with 0 WBCs, 0 RBCs, and no bacteria.  Recent CT abdomen pelvis without contrast in May 2020 showed a 75 g prostate, but no hydronephrosis or obvious lymphadenopathy.   PMH: Past Medical History:  Diagnosis Date  . 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 REM sleep behavior disorder   . Sleep apnea     Surgical History: Past Surgical History:  Procedure Laterality Date  . APPENDECTOMY    . 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    Allergies:  Allergies  Allergen Reactions  . Influenza Vaccines Anaphylaxis    Flu shot: 1974 anaphylaxis. 2nd time swollen arm Flu shot: 1974 anaphylaxis. 2nd time swollen arm     Family History: Family History  Problem Relation Age of Onset  . Heart disease Maternal Grandfather   . Diabetes Paternal Grandfather   . Breast cancer Daughter      Social History:  reports that he quit smoking about 8 years ago. His smoking use included cigarettes and cigars. He has never used smokeless tobacco. He reports current alcohol use. He reports that he does not use drugs.  ROS: Please see flowsheet from today's date for complete review of systems.  Physical Exam: BP 112/73 (BP Location: Left Arm, Patient Position: Sitting)   Pulse 64   Ht 5\' 9"  (1.753 m)   Wt 154 lb (69.9 kg)   BMI 22.74 kg/m    Constitutional:  Alert and oriented, No acute distress. Cardiovascular: No clubbing, cyanosis, or edema. Respiratory: Normal respiratory effort, no increased work of breathing. GI: Abdomen is soft, nontender, nondistended, no abdominal masses GU: No CVA tenderness, phallus without lesions, widely patent meatus DRE: 75 g, grossly abnormal and nodular Lymph: No cervical or inguinal lymphadenopathy. Skin: No rashes, bruises or suspicious lesions. Neurologic: Grossly intact, no focal deficits, moving all 4 extremities. Psychiatric: Normal mood and affect.  Laboratory Data: Reviewed  Pertinent Imaging: Reviewed, see HPI  Assessment & Plan:   In summary, the patient is a 78 year old male with past medical history notable for well-controlled Parkinson's, as well as laser PVP in 2017 who presents with significantly elevated PSA of 26.5 and grossly abnormal DRE worrisome for prostate cancer, as well as moderate urinary symptoms of weak stream and intermittency.  We reviewed the implications of an elevated PSA and the uncertainty surrounding it.  In general, a man's PSA increases with age and is produced by both normal and cancerous prostate tissue. The differential diagnosis for elevated PSA includes BPH, prostate cancer, infection, recent intercourse/ejaculation, recent urethroscopic manipulation (foley placement/cystoscopy) or trauma, and prostatitis.   Management of an elevated PSA can include observation or prostate biopsy and we discussed  this in detail. Our goal is to detect clinically significant prostate cancers, and manage with either active surveillance, surgery, or radiation for localized disease. Risks of prostate biopsy include bleeding, infection (including life threatening sepsis), pain, and lower urinary symptoms. Hematuria, hematospermia, and blood in the stool are all common after biopsy and can persist up to 4 weeks.   Return for prostate biopsy  Billey Co, MD  [facility] [address], [place], [place] [address] [phone]

## 2019-03-20 NOTE — Patient Instructions (Signed)
Prostate Biopsy Instructions  Stop all aspirin or blood thinners (aspirin, plavix, coumadin, warfarin, motrin, ibuprofen, advil, aleve, naproxen, naprosyn) for 7 days prior to the procedure.  If you have any questions about stopping these medications, please contact your primary care physician or cardiologist.  Having a light meal prior to the procedure is recommended.  If you are diabetic or have low blood sugar please bring a small snack or glucose tablet.  A Fleets enema is needed to be purchased over the counter at a local pharmacy and used 2 hours before you scheduled appointment.  This can be purchased over the counter at any pharmacy.  Antibiotics will be administered in the clinic at the time of the procedure unless otherwise specified.    Please bring someone with you to the procedure to drive you home.  A follow up appointment has been scheduled for you to receive the results of the biopsy.  If you have any questions or concerns, please feel free to call the office at [phone] or send a Mychart message.    Thank you, Staff at [facility], Care After This sheet gives you information about how to care for yourself after your procedure. Your doctor may also give you more specific instructions. If you have problems or questions, contact your doctor. What can I expect after the procedure? After the procedure, it is common to have:  Pain and discomfort in your butt, especially while sitting.  Pink-colored pee (urine), due to small amounts of blood in the pee.  Burning while peeing (urinating).  Blood in your poop (stool).  Bleeding from your butt.  Blood in your semen. Follow these instructions at home: Medicines  Take over-the-counter and prescription medicines only as told by your doctor.  If you were prescribed antibiotic medicine, take it as told by your doctor. Do not stop taking the antibiotic even if  you start to feel better. Activity   Do not drive for 24 hours if you were given a medicine to help you relax (sedative) during your procedure.  Return to your normal activities as told by your doctor. Ask your doctor what activities are safe for you.  Ask your doctor when it is okay for you to have sex.  Do not lift anything that is heavier than 10 lb (4.5 kg), or the limit that you are told, until your doctor says that it is safe. General instructions   Drink enough water to keep your pee pale yellow.  Watch your pee, poop, and semen for new bleeding or bleeding that gets worse.  Keep all follow-up visits as told by your doctor. This is important. Contact a doctor if you:  Have blood clots in your pee or poop.  Notice that your pee smells bad or unusual.  Have very bad belly pain.  Have trouble peeing.  Notice that your lower belly feels firm.  Have blood in your pee for more than 2 weeks after the procedure.  Have blood in your semen for more than 2 months after the procedure.  Have problems getting an erection.  Feel sick to your stomach (nauseous).  Throw up (vomit).  Have new or worse bleeding in your pee, poop, or semen. Get help right away if you:  Have a fever or chills.  Have bright red pee.  Have very bad pain that does not get better with medicine.  Cannot pee. Summary  After this procedure, it is common to have  pain and discomfort around your butt, especially while sitting.  You may have blood in your pee and poop.  It is common to have blood in your semen for 1-2 months.  If you were prescribed antibiotic medicine, take it as told by your doctor. Do not stop taking the antibiotic even if you start to feel better.  Get help right away if you have a fever or chills. This information is not intended to replace advice given to you by your health care provider. Make sure you discuss any questions you have with your health care provider. Document  Released: 06/22/2017 Document Revised: 12/14/2018 Document Reviewed: 06/22/2017 Elsevier Patient Education  2020 [employer].    Prostate Cancer Screening  The prostate is a walnut-sized gland that is located below the bladder and in front of the rectum in males. The function of the prostate (prostate gland) is to add fluid to semen during ejaculation. Prostate cancer is the second most common type of cancer in men. A screening test for cancer is a test that is done before cancer symptoms start. Screening can help to identify cancer at an early stage, when the cancer can be treated more easily. The recommended prostate cancer screening test is a blood test called the prostate-specific antigen (PSA) test. PSA is a protein that is made in the prostate. As you age, your prostate naturally produces more PSA. Abnormally high PSA levels may be caused by:  Prostate cancer.  An enlarged prostate that is not caused by cancer (benign prostatic hyperplasia, BPH). This condition is very common in older men.  A prostate gland infection (prostatitis).  Medicines to assist with hair growth, such as finasteride. Depending on the PSA results, you may need more tests, such as:  A physical exam to check the size of your prostate gland.  Blood and imaging tests.  A procedure to remove tissue samples from your prostate gland for testing (biopsy). Who should have screening? Screening recommendations vary based on age.  If you are younger than age 53, screening is not recommended.  If you are age 56-54 and you have no risk factors, screening is not recommended.  If you are younger than age 76, ask your health care provider if you need screening if you have one of these risk factors: ? Being of African-American descent. ? Having a family history of prostate cancer.  If you are age 35-69, talk with your health care provider about your need for screening and how often screening should be done.  If you  are older than age 30, screening is not recommended. This is because the risks that screening can cause are greater than the benefits that it may provide (risks outweigh the benefits). If you are at high risk for prostate cancer, your health care provider may recommend that you have screenings more often or start screening at a younger age. You may be at high risk if you:  Are older than age 49.  Are African-American.  Have a father, brother, or uncle who has been diagnosed with prostate cancer. The risk may be higher if your family member's cancer occurred at an early age. What are the benefits of screening? There is a small chance that screening may lower your risk of dying from prostate cancer. The chance is small because prostate cancer is typically a slow-growing cancer, and most men with prostate cancer die from a different cause. What are the risks of screening? The main risk of prostate cancer screening is diagnosing  and treating prostate cancer that would never have caused any symptoms or problems (overdiagnosis and overtreatment). PSA screening cannot tell you if your PSA is high due to cancer or a different cause. A prostate biopsy is the only procedure to diagnose prostate cancer. Even the results of a biopsy may not tell you if your cancer needs to be treated. Slow-growing prostate cancer may not need any treatment other than monitoring, so diagnosing and treating it may cause unnecessary stress or other side effects. A prostate biopsy may also cause:  Infection or fever.  A false negative. This is a result that shows that you do not have prostate cancer when you actually do have prostate cancer. Questions to ask your health care provider  When should I start prostate cancer screening?  What is my risk for prostate cancer?  How often do I need screening?  What type of screening tests do I need?  How do I get my test results?  What do my results mean?  Do I need treatment?  Contact a health care provider if:  You have difficulty urinating.  You have pain when you urinate or ejaculate.  You have blood in your urine or semen.  You have pain in your back or in the area of your prostate.  You have trouble getting or maintaining an erection (erectile dysfunction, ED). Summary  Prostate cancer is a common type of cancer in men. The prostate (prostate gland) is located below the bladder and in front of the rectum. This gland adds fluid to semen during ejaculation.  Prostate cancer screening may identify cancer at an early stage, when the cancer can be treated more easily.  The prostate-specific antigen (PSA) test is the recommended screening test for prostate cancer.  Discuss the risks and benefits of prostate cancer screening with your health care provider. If you are age 73 or older, screening is likely to lead to more risks than benefits (risks outweigh the benefits). This information is not intended to replace advice given to you by your health care provider. Make sure you discuss any questions you have with your health care provider. Document Released: 06/04/2017 Document Revised: 08/06/2017 Document Reviewed: 06/04/2017 Elsevier Patient Education  2020 [employer].

## 2019-04-06 ENCOUNTER — Ambulatory Visit: Payer: Medicare Other | Attending: Family Medicine | Admitting: Physical Therapy

## 2019-04-06 ENCOUNTER — Other Ambulatory Visit: Payer: Self-pay

## 2019-04-06 ENCOUNTER — Ambulatory Visit: Payer: Medicare Other | Admitting: Occupational Therapy

## 2019-04-06 ENCOUNTER — Ambulatory Visit: Payer: Medicare Other

## 2019-04-06 DIAGNOSIS — R29818 Other symptoms and signs involving the nervous system: Secondary | ICD-10-CM

## 2019-04-06 DIAGNOSIS — R471 Dysarthria and anarthria: Secondary | ICD-10-CM | POA: Insufficient documentation

## 2019-04-06 DIAGNOSIS — R1312 Dysphagia, oropharyngeal phase: Secondary | ICD-10-CM

## 2019-04-06 DIAGNOSIS — R2689 Other abnormalities of gait and mobility: Secondary | ICD-10-CM | POA: Insufficient documentation

## 2019-04-06 NOTE — Therapy (Addendum)
[facility] [address] [place], [place], [address] Phone: [phone]   Fax:  [phone]  Patient Details  Name: [name] [name] MRN: [id number] Date of Birth: 07-Mar-1941 Referring Provider:  Juline Patch, MD  Encounter Date: 04/06/2019  Occupational Therapy Parkinson's Disease Screen  Hand dominance:  right   Fastening/unfastening 3 buttons in:  34.62sec  9-hole peg test:    RUE  43.28 sec        LUE  29.34 sec  Box & Blocks Test:   RUE  42 blocks        LUE  56 blocks  Change in ability to perform ADLs/IADLs:  Slowing in all ADLs, particularly with cooking tasks.  Other Comments:  Unable to participate in community exercise classes.  Pt would benefit from occupational therapy evaluation due to  Slowing of ADLs and decline in R dominant hand coordination.  Pt agrees with plan.  (Current OT referral was place in Epic 02/07/19)     [facility] 04/06/2019, 10:52 AM  [facility] [address] [place], [place], [address] Phone: [phone]   Fax:  [place], OTR/L [facility] [address] [place], [place]  [address] [phone] phone [phone] 04/06/19 10:52 AM

## 2019-04-06 NOTE — Therapy (Signed)
[facility] [address] [place], [place], [address] Phone: [phone]   Fax:  [phone]  Patient Details  Name: [name] [name] MRN: [id number] Date of Birth: November 18, 1940 Referring Provider:  Alonza Bogus, DO  Encounter Date: 04/06/2019  Speech Therapy Parkinson's Disease Screen   Decibel Level today: upper 60s-low 70sdB  (WNL=70-72 dB), masked, with sound level meter 30cm away from pt's mouth. Pt's conversational volume has remained the same/increased since last evaluation December 2019.  Pt does not report difficulty in swallowing warranting furhter evaluation.  Pt does does not require speech therapy services at this time. Recommend ST screen in another approx 6  months    [facility] ,MS, CCC-SLP  04/06/2019, 11:09 AM  [facility] [address] [place], [place], [address] Phone: [phone]   Fax:  [phone]

## 2019-04-06 NOTE — Therapy (Addendum)
[facility] [address] [place], [place], [address] Phone: [phone]   Fax:  [phone]  Patient Details  Name: [name] [name] MRN: [id number] Date of Birth: 10-Jan-1941 Referring Provider:  Juline Patch, MD  Encounter Date: 04/06/2019  Physical Therapy Parkinson's Disease Screen  Was doing [place] [employer] before covid.  Still doing some exercises from previous PT sessions and from [facility]. No falls since last screen  Timed Up and Go test: 7.44 sec normal     8.31 sec manual     8.11 sec cognitive   10 meter walk test:  7.82 sec=4.19 ft/sec 5 time sit to stand test: 9.25 seconds  Patient does not require Physical Therapy services at this time.  Recommend Physical Therapy screen in 6 months.    Narda Bonds, [place] 04/06/19 11:38 AM Phone: [phone] Fax: [facility] [place] [address] [place], [place], [address] Phone: [phone]   Fax:  [phone]  Agree with screen information and recommend screen in 6 months. Mady Haagensen, PT 04/07/19 7:08 AM Phone: [phone] Fax: [phone]

## 2019-04-11 DIAGNOSIS — H2513 Age-related nuclear cataract, bilateral: Secondary | ICD-10-CM | POA: Diagnosis not present

## 2019-04-11 DIAGNOSIS — H43813 Vitreous degeneration, bilateral: Secondary | ICD-10-CM | POA: Diagnosis not present

## 2019-04-11 DIAGNOSIS — H04123 Dry eye syndrome of bilateral lacrimal glands: Secondary | ICD-10-CM | POA: Diagnosis not present

## 2019-04-11 DIAGNOSIS — H21233 Degeneration of iris (pigmentary), bilateral: Secondary | ICD-10-CM | POA: Diagnosis not present

## 2019-04-14 ENCOUNTER — Ambulatory Visit: Payer: Medicare Other | Admitting: Urology

## 2019-04-17 ENCOUNTER — Ambulatory Visit (INDEPENDENT_AMBULATORY_CARE_PROVIDER_SITE_OTHER): Payer: Medicare Other | Admitting: Urology

## 2019-04-17 ENCOUNTER — Other Ambulatory Visit: Payer: Medicare Other | Admitting: Urology

## 2019-04-17 ENCOUNTER — Encounter: Payer: Self-pay | Admitting: Urology

## 2019-04-17 ENCOUNTER — Other Ambulatory Visit: Payer: Self-pay

## 2019-04-17 ENCOUNTER — Other Ambulatory Visit: Payer: Self-pay | Admitting: Urology

## 2019-04-17 VITALS — BP 104/68 | HR 60 | Ht 69.0 in | Wt 157.0 lb

## 2019-04-17 DIAGNOSIS — C61 Malignant neoplasm of prostate: Secondary | ICD-10-CM | POA: Diagnosis not present

## 2019-04-17 DIAGNOSIS — R972 Elevated prostate specific antigen [PSA]: Secondary | ICD-10-CM

## 2019-04-17 DIAGNOSIS — N400 Enlarged prostate without lower urinary tract symptoms: Secondary | ICD-10-CM

## 2019-04-17 MED ORDER — GENTAMICIN SULFATE 40 MG/ML IJ SOLN
80.0000 mg | Freq: Once | INTRAMUSCULAR | Status: AC
  Administered 2019-04-17: 80 mg via INTRAMUSCULAR

## 2019-04-17 MED ORDER — LEVOFLOXACIN 500 MG PO TABS
500.0000 mg | ORAL_TABLET | Freq: Once | ORAL | Status: AC
  Administered 2019-04-17: 500 mg via ORAL

## 2019-04-17 NOTE — Progress Notes (Signed)
   04/17/19  Indication: Elevated PSA, 26.5  Prostate Biopsy Procedure   Informed consent was obtained, and we discussed the risks of bleeding and infection/sepsis. A time out was performed to ensure correct patient identity.  Pre-Procedure: - Last PSA Level: 26.5 - Gentamicin and levaquin given for antibiotic prophylaxis - Transrectal Ultrasound performed revealing a 36 gm prostate, PSA density 0.73 - No significant median lobe noted - Right side of gland hypoechoic  Procedure: - Prostate block performed using 10 cc 1% lidocaine and biopsies taken from sextant areas, a total of 12 under ultrasound guidance.  Post-Procedure: - Patient tolerated the procedure well - He was counseled to seek immediate medical attention if experiences significant bleeding, fevers, or severe pain - Return in one week to discuss biopsy results  Assessment/ Plan: Will follow up in 1-2 weeks to discuss pathology  Nickolas Madrid, MD 04/17/2019

## 2019-04-18 ENCOUNTER — Ambulatory Visit: Payer: Medicare Other | Attending: Neurology | Admitting: Occupational Therapy

## 2019-04-18 DIAGNOSIS — R29818 Other symptoms and signs involving the nervous system: Secondary | ICD-10-CM | POA: Insufficient documentation

## 2019-04-18 DIAGNOSIS — R2689 Other abnormalities of gait and mobility: Secondary | ICD-10-CM | POA: Diagnosis not present

## 2019-04-18 DIAGNOSIS — R2681 Unsteadiness on feet: Secondary | ICD-10-CM | POA: Diagnosis not present

## 2019-04-18 DIAGNOSIS — R293 Abnormal posture: Secondary | ICD-10-CM | POA: Insufficient documentation

## 2019-04-18 DIAGNOSIS — R29898 Other symptoms and signs involving the musculoskeletal system: Secondary | ICD-10-CM | POA: Insufficient documentation

## 2019-04-18 DIAGNOSIS — M25621 Stiffness of right elbow, not elsewhere classified: Secondary | ICD-10-CM | POA: Diagnosis not present

## 2019-04-18 DIAGNOSIS — R278 Other lack of coordination: Secondary | ICD-10-CM | POA: Diagnosis not present

## 2019-04-18 NOTE — Patient Instructions (Signed)
?

## 2019-04-19 NOTE — Therapy (Signed)
[facility] [address] [name], [place], [address] Phone: [phone]   Fax:  [phone]  Occupational Therapy Evaluation  Patient Details  Name: [name] [name] MRN: [id number] Date of Birth: Jan 17, 1941 Referring Provider (OT): Dr. Carles Collet   Encounter Date: 04/18/2019  OT End of Session - 04/19/19 1055    Visit Number  1    Number of Visits  11    Date for OT Re-Evaluation  05/26/19    Authorization Type  Medicare/ Tricare    Authorization - Visit Number  1    Authorization - Number of Visits  10    OT Start Time  1107    OT Stop Time  1144    OT Time Calculation (min)  37 min    Activity Tolerance  Patient tolerated treatment well    Behavior During Therapy  [facility] for tasks assessed/performed       Past Medical History:  Diagnosis Date  . 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 REM sleep behavior disorder   . Sleep apnea     Past Surgical History:  Procedure Laterality Date  . APPENDECTOMY    . 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  Subjective Assessment - 04/18/19 1109    Subjective   Pt reports a decline in dexterity    Patient Stated Goals  improve dexterity and fine motor skills    Currently in Pain?  No/denies        [facility] OT Assessment - 04/19/19 0001      Assessment   Medical Diagnosis  Parkinson's diesease    Referring Provider (OT)  Dr. Carles Collet    Onset Date/Surgical Date  02/07/19    Hand Dominance  Right    Prior Therapy  OT,       Precautions   Precautions  Fall      Balance Screen   Has the patient fallen in the past 6 months  No    Has the patient had a decrease in activity level because of a fear of falling?   No    Is the patient reluctant to leave their home because of a fear of falling?   No      Home  Environment   Family/patient expects to be discharged to:  Private residence    Home Access  Stairs    Lives With  Spouse      Prior Function   Level of [place]  Retired    Leisure  went to rock steady 3x week prior to [employer]      ADL   Eating/Feeding  Modified independent   increased spills   Grooming  Modified independent    Upper Body Bathing  Modified independent    Lower Body Bathing  Modified independent    Upper Body Dressing  Increased time   modified independnet, difficulty with fastening buttons   Lower Body Dressing  Increased time    Toilet Transfer  Modified independent    Tub/Shower Transfer  Modified independent    ADL comments  Pt reports difficulty with the following activities: fastening buttons, writing, eating, cutting food      IADL   Shopping  Takes care of all shopping needs independently    [place] alone or with occasional assistance    Meal Prep  Plans, prepares and serves adequate meals independently    Medication  Management  Is responsible for taking medication in correct dosages at correct time    Financial Management  --   slowed handwriting     Mobility   Mobility Status  Independent      Written Expression   Dominant Hand  Right    Handwriting  100% legible;Increased time   Pt reports handwriting gets more difficult writing a while     Vision Assessment   Vision Assessment  Vision not tested      Cognition   Overall Cognitive Status  Within Functional Limits for tasks assessed      Observation/Other Assessments   Physical Performance Test    Yes    Simulated Eating Time (seconds)  18.09    Donning Doffing Jacket Time (seconds)  14.47    Donning Doffing Jacket Comments  3 button/ unbutton: 36.09      Sensation   Light Touch  Not tested      Coordination   Gross Motor Movements are Fluid and Coordinated  No    Fine Motor Movements are Fluid and Coordinated  No    9 Hole Peg Test  Right;Left    Right 9 Hole Peg Test  36.44    Left 9 Hole Peg Test  25.28    Box and Blocks  RUE 50 blocks, LUE 56    Tremors  RUE and RLE  resting tremor    Coordination  impaired for RUE      Tone   Assessment Location  Right Upper Extremity      ROM / Strength   AROM / PROM / Strength  AROM      AROM   Overall AROM   Deficits    Overall AROM Comments  RUE shoulder flexion: 110, elbow extension:-15, LUE shoulder flexion 120, elbow extension -10, Pt maintains MP's flexed at rest for RUE.      RUE Tone   RUE Tone  Mild   rigidity                     OT Education - 04/19/19 1056    Education Details  beginning coordination HEP    Person(s) Educated  Patient    Methods  Explanation;Demonstration;Handout;Verbal cues    Comprehension  Verbalized understanding;Returned demonstration          OT Long Term Goals - 04/19/19 1044      OT LONG TERM GOAL #1   Title  I with PD specific HEP    Time  5    Period  Weeks    Status  New    Target Date  05/26/19      OT LONG TERM GOAL #2   Title  Pt will demonstrate improved fine motor coordination for ADLs as evidenced by completing 9 hole peg test for RUE in 33secs or less    Baseline  RUE 36.94, LUE 25.28    Time  5    Period  Weeks    Status  New      OT LONG TERM GOAL #3   Title  Pt will verbalize understanding of adapted strategies for ADLs/IADLs (particularly strategies for writing, cutting food, eating, fasten buttons)    Time  5    Period  Weeks    Status  New      OT LONG TERM GOAL #4   Title  Pt will demonstrate ability to retrieve a lightweight object at 115 shoulder flexion with -10 elbow extension with RUE.  Baseline  RUE 110, -15, LUE 120, -10    Time  5    Period  Weeks    Status  New      OT LONG TERM GOAL #5   Title  Pt will demonstrate increased ease with fastening buttons as evidenced by performing 3 button/ unbutton in 32 secs or less.    Baseline  36.09    Time  5    Period  Weeks    Status  New      Long Term Additional Goals   Additional Long Term Goals  Yes      OT LONG TERM GOAL #6   Title  Pt will demonstrate  improved ease with self feeding as evidenced by decreasing PPT#2 to 15 secs or less    Baseline  18.09    Time  5    Period  Weeks    Status  New            Plan - 04/19/19 1041    Clinical Impression Statement  Pt is a 78 y.o with diagnosis of Parkinson's disease who returns to occupational therapy with a decline in ADLS/IADLS. Pt demonstrates the following: decreased coordination, decreased balance, rigidity , tremor, and decreased ROM which limits pt's performance of daily activities. Pt can benefit from skilled occupational therapy to maximize safety and independence with ADLs/ IADLS.    OT Occupational Profile and History  Problem Focused Assessment - Including review of records relating to presenting problem    Occupational performance deficits (Please refer to evaluation for details):  ADL's;IADL's;Leisure;Social Participation    Body Structure / Function / Physical Skills  ADL;UE functional use;Balance;Flexibility;FMC;ROM;Gait;Coordination;GMC;Decreased knowledge of precautions;Dexterity;Mobility;Tone;Strength    Rehab Potential  Good    Clinical Decision Making  Limited treatment options, no task modification necessary    Comorbidities Affecting Occupational Performance:  None    Modification or Assistance to Complete Evaluation   No modification of tasks or assist necessary to complete eval    OT Frequency  2x / week    OT Duration  --   5 weeks, plus eval   OT Treatment/Interventions  Self-care/ADL training;Energy conservation;Aquatic Therapy;DME and/or AE instruction;Patient/family education;Balance training;Passive range of motion;Paraffin;Gait Training;Fluidtherapy;Functional Mobility Training;Moist Heat;Therapeutic exercise;Manual Therapy;Therapeutic activities;Neuromuscular education    Plan  add to coordination HEP, PWR! moves    Consulted and Agree with Plan of Care  Patient       Patient will benefit from skilled therapeutic intervention in order to improve the  following deficits and impairments:   Body Structure / Function / Physical Skills: ADL, UE functional use, Balance, Flexibility, FMC, ROM, Gait, Coordination, GMC, Decreased knowledge of precautions, Dexterity, Mobility, Tone, Strength       Visit Diagnosis: 1. Other symptoms and signs involving the nervous system   2. Other symptoms and signs involving the musculoskeletal system   3. Other lack of coordination   4. Other abnormalities of gait and mobility   5. Abnormal posture       Problem List Patient Active Problem List   Diagnosis Date Noted  . Establishing care with new doctor, encounter for 09/26/2018  . 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 Hx of colonic polyps 05/18/2014  . BPH (benign prostatic hyperplasia) 01/12/2014  . Obstructive sleep apnea (adult) (pediatric) 01/17/2012  . 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    RINE,KATHRYN 04/19/2019, 10:59 AM  [facility] [address] [place], [place], [address] Phone: [phone]   Fax:  [phone]  Name: [name] [name] MRN: [id number] Date of Birth: Jan 29, 1941

## 2019-04-20 ENCOUNTER — Ambulatory Visit: Payer: Medicare Other | Admitting: Occupational Therapy

## 2019-04-21 ENCOUNTER — Encounter: Payer: Self-pay | Admitting: Family Medicine

## 2019-04-21 LAB — ANATOMIC PATHOLOGY REPORT: PDF Image: 0

## 2019-04-25 ENCOUNTER — Ambulatory Visit: Payer: Medicare Other | Admitting: Occupational Therapy

## 2019-04-25 ENCOUNTER — Other Ambulatory Visit: Payer: Self-pay

## 2019-04-25 DIAGNOSIS — R278 Other lack of coordination: Secondary | ICD-10-CM

## 2019-04-25 DIAGNOSIS — R293 Abnormal posture: Secondary | ICD-10-CM | POA: Diagnosis not present

## 2019-04-25 DIAGNOSIS — R2681 Unsteadiness on feet: Secondary | ICD-10-CM | POA: Diagnosis not present

## 2019-04-25 DIAGNOSIS — R2689 Other abnormalities of gait and mobility: Secondary | ICD-10-CM

## 2019-04-25 DIAGNOSIS — N400 Enlarged prostate without lower urinary tract symptoms: Secondary | ICD-10-CM

## 2019-04-25 DIAGNOSIS — R29898 Other symptoms and signs involving the musculoskeletal system: Secondary | ICD-10-CM | POA: Diagnosis not present

## 2019-04-25 DIAGNOSIS — R29818 Other symptoms and signs involving the nervous system: Secondary | ICD-10-CM

## 2019-04-25 MED ORDER — TAMSULOSIN HCL 0.4 MG PO CAPS: 0.4000 mg | ORAL_CAPSULE | Freq: Every day | ORAL | 3 refills | Status: DC

## 2019-04-25 NOTE — Therapy (Signed)
[facility] [address] [place], [place], [address] Phone: [phone]   Fax:  [phone]  Occupational Therapy Treatment  Patient Details  Name: [name] [name] MRN: [id number] Date of Birth: 01/14/1941 Referring Provider (OT): Dr. Carles Collet   Encounter Date: 04/25/2019  OT End of Session - 04/25/19 1524    Visit Number  2    Number of Visits  11    Date for OT Re-Evaluation  05/26/19    Authorization Type  Medicare/ Tricare    Authorization - Visit Number  2    Authorization - Number of Visits  10    OT Start Time  1450    OT Stop Time  1530    OT Time Calculation (min)  40 min    Activity Tolerance  Patient tolerated treatment well    Behavior During Therapy  [facility] for tasks assessed/performed       Past Medical History:  Diagnosis Date  . 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 REM sleep behavior disorder   . Sleep apnea     Past Surgical History:  Procedure Laterality Date  . APPENDECTOMY    . 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  Subjective Assessment - 04/25/19 1448    Subjective   --    Patient Stated Goals  improve dexterity and fine motor skills    Currently in Pain?  No/denies              Treatment: Pt practice writing with RUE supported on tabletop, min v.c for letter size, good legibility.             OT Education - 04/25/19 1502    Education Details  PWR! moves basic 4 standing 20 reps each, min v.c for amplitude, coordination HEP review, min v.c for larger amplitude movements, handwriting strategies    Person(s) Educated  Patient    Methods  Explanation;Demonstration;Verbal cues;Handout    Comprehension  Verbalized understanding;Returned demonstration;Verbal cues required          OT Long Term Goals - 04/19/19 1044      OT LONG TERM GOAL #1   Title  I with PD specific HEP    Time  5    Period  Weeks    Status  New    Target Date   05/26/19      OT LONG TERM GOAL #2   Title  Pt will demonstrate improved fine motor coordination for ADLs as evidenced by completing 9 hole peg test for RUE in 33secs or less    Baseline  RUE 36.94, LUE 25.28    Time  5    Period  Weeks    Status  New      OT LONG TERM GOAL #3   Title  Pt will verbalize understanding of adapted strategies for ADLs/IADLs (particularly strategies for writing, cutting food, eating, fasten buttons)    Time  5    Period  Weeks    Status  New      OT LONG TERM GOAL #4   Title  Pt will demonstrate ability to retrieve a lightweight object at 115 shoulder flexion with -10 elbow extension with RUE.    Baseline  RUE 110, -15, LUE 120, -10    Time  5    Period  Weeks    Status  New      OT LONG TERM GOAL #5   Title  Pt will demonstrate increased ease with fastening buttons as evidenced by performing 3 button/ unbutton in 32 secs or less.    Baseline  36.09    Time  5    Period  Weeks    Status  New      Long Term Additional Goals   Additional Long Term Goals  Yes      OT LONG TERM GOAL #6   Title  Pt will demonstrate improved ease with self feeding as evidenced by decreasing PPT#2 to 15 secs or less    Baseline  18.09    Time  5    Period  Weeks    Status  New              Patient will benefit from skilled therapeutic intervention in order to improve the following deficits and impairments:           Visit Diagnosis: No diagnosis found.    Problem List Patient Active Problem List   Diagnosis Date Noted  . Establishing care with new doctor, encounter for 09/26/2018  . 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 Hx of colonic polyps 05/18/2014  . BPH (benign prostatic hyperplasia) 01/12/2014  . Obstructive sleep apnea (adult) (pediatric) 01/17/2012  . 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     Tylor Courtwright 04/25/2019, 3:42 PM  [facility] [address] [place], [place], [address] Phone: [phone]   Fax:  [phone]  Name: [name] [name] MRN: [id number] Date of Birth: 1940/12/31

## 2019-04-27 ENCOUNTER — Ambulatory Visit: Payer: Medicare Other | Admitting: Occupational Therapy

## 2019-04-27 NOTE — Telephone Encounter (Signed)
-----   Message from Royanne Foots, [place] sent at 04/25/2019  9:32 AM EDT ----- Regarding: FW: virtual visit  ----- Message ----- From: Billey Co, MD Sent: 04/24/2019   4:46 PM EDT To: Rowe Robert Clinical Subject: virtual visit                                  I was unable to reach the patient by phone today, please set up a 15 minute virtual visit this week to review results, OK to overbook at 12pm or 4pm any day I'm in clinic  Nickolas Madrid, MD 04/24/2019

## 2019-04-27 NOTE — Telephone Encounter (Signed)
App made and patient is aware ° °[name] [name] °

## 2019-05-02 ENCOUNTER — Encounter: Payer: Self-pay | Admitting: Occupational Therapy

## 2019-05-02 ENCOUNTER — Ambulatory Visit: Payer: Medicare Other | Admitting: Occupational Therapy

## 2019-05-02 ENCOUNTER — Other Ambulatory Visit: Payer: Self-pay

## 2019-05-02 DIAGNOSIS — R2681 Unsteadiness on feet: Secondary | ICD-10-CM | POA: Diagnosis not present

## 2019-05-02 DIAGNOSIS — R2689 Other abnormalities of gait and mobility: Secondary | ICD-10-CM | POA: Diagnosis not present

## 2019-05-02 DIAGNOSIS — R278 Other lack of coordination: Secondary | ICD-10-CM

## 2019-05-02 DIAGNOSIS — R293 Abnormal posture: Secondary | ICD-10-CM

## 2019-05-02 DIAGNOSIS — R29818 Other symptoms and signs involving the nervous system: Secondary | ICD-10-CM | POA: Diagnosis not present

## 2019-05-02 DIAGNOSIS — R29898 Other symptoms and signs involving the musculoskeletal system: Secondary | ICD-10-CM

## 2019-05-02 DIAGNOSIS — M25621 Stiffness of right elbow, not elsewhere classified: Secondary | ICD-10-CM

## 2019-05-02 NOTE — Therapy (Signed)
[name] [address] [place], [place], [address] Phone: [phone]   Fax:  [phone]  Occupational Therapy Treatment  Patient Details  Name: [name] [name] MRN: [id number] Date of Birth: 1941-06-16 Referring Provider (OT): Dr. Carles Collet   Encounter Date: 05/02/2019  OT End of Session - 05/02/19 1138    Visit Number  3    Number of Visits  11    Date for OT Re-Evaluation  05/26/19    Authorization Type  Medicare/ Tricare    Authorization - Visit Number  3    Authorization - Number of Visits  10    OT Start Time  [id number]    OT Stop Time  1144    OT Time Calculation (min)  39 min    Activity Tolerance  Patient tolerated treatment well    Behavior During Therapy  [facility] for tasks assessed/performed       Past Medical History:  Diagnosis Date  . 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 REM sleep behavior disorder   . Sleep apnea     Past Surgical History:  Procedure Laterality Date  . APPENDECTOMY    . 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  Subjective Assessment - 05/02/19 1127    Subjective   missed last scheduled session    Patient Stated Goals  improve dexterity and fine motor skills    Currently in Pain?  No/denies           PWR! Moves (basic 4) in modified quadruped x 20 each with min cues For incr movement amplitude and deliberate stop/start to movement as well as to avoid hyperext trunk with PWR! up.  Sliding cards off table by using PWR! Hands with focus on finger ext and min cues for incr movement amplitude.  Min-mod difficulty R hand.  Tossing/bouncing ball with therapist, against floor, and against wall with BUEs with focus on deliberate/powerful opening of fingers and extending UEs with min cueing, then progressed to add sequencing for dual task.  Writing (copying recipe) with good size/legibility, but incr time.  Pt educated to observe hand positioning when holding  pen.  Simulated cutting food with fork/knife with min cueing for positioning and large amplitude movement strategies.  Reviewed strategies for eating with spoon/fork.  Verbally reviewed strategies for buttoning (use of PWR! Hands).            OT Long Term Goals - 04/19/19 1044      OT LONG TERM GOAL #1   Title  I with PD specific HEP    Time  5    Period  Weeks    Status  New    Target Date  05/26/19      OT LONG TERM GOAL #2   Title  Pt will demonstrate improved fine motor coordination for ADLs as evidenced by completing 9 hole peg test for RUE in 33secs or less    Baseline  RUE 36.94, LUE 25.28    Time  5    Period  Weeks    Status  New      OT LONG TERM GOAL #3   Title  Pt will verbalize understanding of adapted strategies for ADLs/IADLs (particularly strategies for writing, cutting food, eating, fasten buttons)    Time  5    Period  Weeks    Status  New      OT LONG TERM GOAL #4   Title  Pt  will demonstrate ability to retrieve a lightweight object at 115 shoulder flexion with -10 elbow extension with RUE.    Baseline  RUE 110, -15, LUE 120, -10    Time  5    Period  Weeks    Status  New      OT LONG TERM GOAL #5   Title  Pt will demonstrate increased ease with fastening buttons as evidenced by performing 3 button/ unbutton in 32 secs or less.    Baseline  36.09    Time  5    Period  Weeks    Status  New      Long Term Additional Goals   Additional Long Term Goals  Yes      OT LONG TERM GOAL #6   Title  Pt will demonstrate improved ease with self feeding as evidenced by decreasing PPT#2 to 15 secs or less    Baseline  18.09    Time  5    Period  Weeks    Status  New            Plan - 05/02/19 1139    Clinical Impression Statement  Pt is progressing towards goals with good understanding of adaptive strategies for ADLs.    OT Occupational Profile and History  Problem Focused Assessment - Including review of records relating to presenting problem     Occupational performance deficits (Please refer to evaluation for details):  ADL's;IADL's;Leisure;Social Participation    Body Structure / Function / Physical Skills  ADL;UE functional use;Balance;Flexibility;FMC;ROM;Gait;Coordination;GMC;Decreased knowledge of precautions;Dexterity;Mobility;Tone;Strength    Rehab Potential  Good    Clinical Decision Making  Limited treatment options, no task modification necessary    Comorbidities Affecting Occupational Performance:  None    Modification or Assistance to Complete Evaluation   No modification of tasks or assist necessary to complete eval    OT Frequency  2x / week    OT Duration  --   5 weeks, plus eval   OT Treatment/Interventions  Self-care/ADL training;Energy conservation;Aquatic Therapy;DME and/or AE instruction;Patient/family education;Balance training;Passive range of motion;Paraffin;Gait Training;Fluidtherapy;Functional Mobility Training;Moist Heat;Therapeutic exercise;Manual Therapy;Therapeutic activities;Neuromuscular education    Plan  continue with ADL strategies and large amplitude movements    Consulted and Agree with Plan of Care  Patient       Patient will benefit from skilled therapeutic intervention in order to improve the following deficits and impairments:   Body Structure / Function / Physical Skills: ADL, UE functional use, Balance, Flexibility, FMC, ROM, Gait, Coordination, GMC, Decreased knowledge of precautions, Dexterity, Mobility, Tone, Strength       Visit Diagnosis: Other symptoms and signs involving the nervous system  Other symptoms and signs involving the musculoskeletal system  Other lack of coordination  Other abnormalities of gait and mobility  Abnormal posture  Unsteadiness on feet  Stiffness of right elbow, not elsewhere classified    Problem List Patient Active Problem List   Diagnosis Date Noted  . Establishing care with new doctor, encounter for 09/26/2018  . Prostate hypertrophy  12/30/2015  . 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 Hx of colonic polyps 05/18/2014  . BPH (benign prostatic hyperplasia) 01/12/2014  . Obstructive sleep apnea (adult) (pediatric) 01/17/2012  . 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 [place], [place]  [address] [phone] phone [phone] 05/02/19 2:51 PM

## 2019-05-03 ENCOUNTER — Telehealth (INDEPENDENT_AMBULATORY_CARE_PROVIDER_SITE_OTHER): Payer: Medicare Other | Admitting: Urology

## 2019-05-03 ENCOUNTER — Telehealth: Payer: Self-pay | Admitting: Urology

## 2019-05-03 DIAGNOSIS — C61 Malignant neoplasm of prostate: Secondary | ICD-10-CM | POA: Diagnosis not present

## 2019-05-03 NOTE — Telephone Encounter (Signed)
-----   Message from Billey Co, MD sent at 05/03/2019  4:22 PM EDT ----- Regarding: follow up Please schedule follow up with me in 2 weeks to discuss imaging results.  Nickolas Madrid, MD 05/03/2019

## 2019-05-03 NOTE — Progress Notes (Signed)
Virtual Visit via Telephone Note  I connected with [name] [name] on 05/03/19 at  4:00 PM EDT by telephone and verified that I am speaking with the correct person using two identifiers.   I discussed the limitations, risks, security and privacy concerns of performing an evaluation and management service by telephone and the availability of in person appointments. We discussed the impact of the COVID-19 pandemic on the healthcare system, and the importance of social distancing and reducing patient and provider exposure. I also discussed with the patient that there may be a patient responsible charge related to this service. The patient expressed understanding and agreed to proceed.  Reason for visit: Discuss prostate biopsy results  History of Present Illness: I called [name] [name] today to discuss his prostate biopsy results.  To briefly summarize, he is a 78 year old relatively healthy male with well-controlled Parkinson's who was referred for an elevated PSA of 26.5.  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  These were almost entirely Gleason score 4+5=9, and almost all cores were greater than 80% involvement.  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  We discussed the next step would be staging imaging with CT abdomen pelvis with contrast(MRI if unable to get contrast from CKD), and bone scan.  We will obtain these this week, and follow-up next week to review results.    We discussed treatment options very on evidence of metastatic disease, but will likely include hormone treatment, and possible referral to medical oncology.  Radiation may also play a role pending the findings on his staging imaging.    Follow Up:  Complete staging imaging with bone scan and CT abdomen pelvis with contrast this week Follow-up in 1 week to discuss results   I  discussed the assessment and treatment plan with the patient. The patient was provided an opportunity to ask questions and all were answered. The patient agreed with the plan and demonstrated an understanding of the instructions.   The patient was advised to call back or seek an in-person evaluation if the symptoms worsen or if the condition fails to improve as anticipated.  I provided 15 minutes of non-face-to-face time during this encounter.   Billey Co, MD

## 2019-05-03 NOTE — Telephone Encounter (Signed)
Scheduled 2 wk follow up

## 2019-05-04 ENCOUNTER — Other Ambulatory Visit: Payer: Self-pay

## 2019-05-04 ENCOUNTER — Ambulatory Visit: Payer: Medicare Other | Admitting: Occupational Therapy

## 2019-05-04 DIAGNOSIS — R29818 Other symptoms and signs involving the nervous system: Secondary | ICD-10-CM | POA: Diagnosis not present

## 2019-05-04 DIAGNOSIS — R29898 Other symptoms and signs involving the musculoskeletal system: Secondary | ICD-10-CM | POA: Diagnosis not present

## 2019-05-04 DIAGNOSIS — R293 Abnormal posture: Secondary | ICD-10-CM | POA: Diagnosis not present

## 2019-05-04 DIAGNOSIS — R2689 Other abnormalities of gait and mobility: Secondary | ICD-10-CM

## 2019-05-04 DIAGNOSIS — R2681 Unsteadiness on feet: Secondary | ICD-10-CM | POA: Diagnosis not present

## 2019-05-04 DIAGNOSIS — R278 Other lack of coordination: Secondary | ICD-10-CM

## 2019-05-04 NOTE — Therapy (Signed)
[facility] [address] [place], [place], [address] Phone: [phone]   Fax:  [phone]  Occupational Therapy Treatment  Patient Details  Name: [name] [name] MRN: [id number] Date of Birth: April 19, 1941 Referring Provider (OT): Dr. Carles Collet   Encounter Date: 05/04/2019  OT End of Session - 05/04/19 1606    Visit Number  4    Number of Visits  11    Date for OT Re-Evaluation  05/26/19    Authorization Type  Medicare/ Tricare    Authorization - Visit Number  4    Authorization - Number of Visits  10    OT Start Time  [id number]    OT Stop Time  1614   2 units pt in BR   OT Time Calculation (min)  42 min    Activity Tolerance  Patient tolerated treatment well    Behavior During Therapy  [facility] for tasks assessed/performed       Past Medical History:  Diagnosis Date  . Parkinson's disease ([place])   . REM sleep behavior disorder   . Sleep apnea     Past Surgical History:  Procedure Laterality Date  . APPENDECTOMY    . 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  Subjective Assessment - 05/04/19 1535    Subjective   Pt reports he was diagnosed with prostate CA    Patient Stated Goals  improve dexterity and fine motor skills    Currently in Pain?  No/denies                Treatment: PWR! Moves basic 4 in standing 20 reps each, min v.c and demo Ambulating while tossing ball for dual tasking, min v.c for larger steps. Dynamic step and reach to flip large playing cards with right and left hand, min v.c Rotating ball and then 2 balls in hand and stacking coins for increased fine motor coordination, min v.c, mod difficulty with RUE                 OT Long Term Goals - 04/19/19 1044      OT LONG TERM GOAL #1   Title  I with PD specific HEP    Time  5    Period  Weeks    Status  New    Target Date  05/26/19      OT LONG TERM GOAL #2   Title  Pt will demonstrate  improved fine motor coordination for ADLs as evidenced by completing 9 hole peg test for RUE in 33secs or less    Baseline  RUE 36.94, LUE 25.28    Time  5    Period  Weeks    Status  New      OT LONG TERM GOAL #3   Title  Pt will verbalize understanding of adapted strategies for ADLs/IADLs (particularly strategies for writing, cutting food, eating, fasten buttons)    Time  5    Period  Weeks    Status  New      OT LONG TERM GOAL #4   Title  Pt will demonstrate ability to retrieve a lightweight object at 115 shoulder flexion with -10 elbow extension with RUE.    Baseline  RUE 110, -15, LUE 120, -10    Time  5    Period  Weeks    Status  New      OT LONG TERM GOAL #5   Title  Pt will demonstrate increased ease with fastening buttons as evidenced by performing 3 button/ unbutton in 32 secs or less.    Baseline  36.09    Time  5    Period  Weeks    Status  New      Long Term Additional Goals   Additional Long Term Goals  Yes      OT LONG TERM GOAL #6   Title  Pt will demonstrate improved ease with self feeding as evidenced by decreasing PPT#2 to 15 secs or less    Baseline  18.09    Time  5    Period  Weeks    Status  New            Plan - 05/04/19 1701    Clinical Impression Statement  Pt is progressing towards goals. He demonstrates understanding of PWR! moves yet has difficulty with RUe particularly when fatigued    OT Occupational Profile and History  Problem Focused Assessment - Including review of records relating to presenting problem    Occupational performance deficits (Please refer to evaluation for details):  ADL's;IADL's;Leisure;Social Participation    Body Structure / Function / Physical Skills  ADL;UE functional use;Balance;Flexibility;FMC;ROM;Gait;Coordination;GMC;Decreased knowledge of precautions;Dexterity;Mobility;Tone;Strength    Rehab Potential  Good    Clinical Decision Making  Limited treatment options, no task modification necessary     Comorbidities Affecting Occupational Performance:  None    Modification or Assistance to Complete Evaluation   No modification of tasks or assist necessary to complete eval    OT Frequency  2x / week    OT Duration  --   5 weeks, plus eval   OT Treatment/Interventions  Self-care/ADL training;Energy conservation;Aquatic Therapy;DME and/or AE instruction;Patient/family education;Balance training;Passive range of motion;Paraffin;Gait Training;Fluidtherapy;Functional Mobility Training;Moist Heat;Therapeutic exercise;Manual Therapy;Therapeutic activities;Neuromuscular education    Plan  continue with ADL strategies and large amplitude movements    Consulted and Agree with Plan of Care  Patient       Patient will benefit from skilled therapeutic intervention in order to improve the following deficits and impairments:   Body Structure / Function / Physical Skills: ADL, UE functional use, Balance, Flexibility, FMC, ROM, Gait, Coordination, GMC, Decreased knowledge of precautions, Dexterity, Mobility, Tone, Strength       Visit Diagnosis: Other symptoms and signs involving the nervous system  Other symptoms and signs involving the musculoskeletal system  Other lack of coordination  Other abnormalities of gait and mobility    Problem List Patient Active Problem List   Diagnosis Date Noted  . Establishing care with new doctor, encounter for 09/26/2018  . 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 Parkinson's disease ([place]) 11/07/2015  . 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 Hx of colonic polyps 05/18/2014  . BPH (benign prostatic hyperplasia) 01/12/2014  . Obstructive sleep apnea (adult) (pediatric) 01/17/2012  . 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    , 05/04/2019, 5:03 PM  [facility] [address] [place], [place], [address] Phone: [phone]   Fax:  [phone]  Name:  GREYCEN GEDDES MRN: [id number] Date of Birth: 1940/12/19

## 2019-05-09 ENCOUNTER — Other Ambulatory Visit: Payer: Self-pay

## 2019-05-09 ENCOUNTER — Ambulatory Visit: Payer: Medicare Other | Attending: Neurology | Admitting: Occupational Therapy

## 2019-05-09 ENCOUNTER — Encounter: Payer: Self-pay | Admitting: Occupational Therapy

## 2019-05-09 DIAGNOSIS — R293 Abnormal posture: Secondary | ICD-10-CM | POA: Diagnosis not present

## 2019-05-09 DIAGNOSIS — M25621 Stiffness of right elbow, not elsewhere classified: Secondary | ICD-10-CM | POA: Diagnosis not present

## 2019-05-09 DIAGNOSIS — R2689 Other abnormalities of gait and mobility: Secondary | ICD-10-CM | POA: Diagnosis not present

## 2019-05-09 DIAGNOSIS — R29818 Other symptoms and signs involving the nervous system: Secondary | ICD-10-CM

## 2019-05-09 DIAGNOSIS — R278 Other lack of coordination: Secondary | ICD-10-CM | POA: Diagnosis not present

## 2019-05-09 DIAGNOSIS — R2681 Unsteadiness on feet: Secondary | ICD-10-CM | POA: Diagnosis not present

## 2019-05-09 DIAGNOSIS — R29898 Other symptoms and signs involving the musculoskeletal system: Secondary | ICD-10-CM | POA: Diagnosis not present

## 2019-05-09 NOTE — Therapy (Signed)
[facility] [address] [place], [place], [address] Phone: [phone]   Fax:  [phone]  Occupational Therapy Treatment  Patient Details  Name: [name] [name] MRN: [id number] Date of Birth: 06-28-1941 Referring Provider (OT): Dr. Carles Collet   Encounter Date: 05/09/2019  OT End of Session - 05/09/19 1245    Visit Number  5    Number of Visits  11    Date for OT Re-Evaluation  05/26/19    Authorization Type  Medicare/ Tricare    Authorization - Visit Number  5    Authorization - Number of Visits  10    OT Start Time  [id number]    OT Stop Time  1317    OT Time Calculation (min)  39 min    Activity Tolerance  Patient tolerated treatment well    Behavior During Therapy  [facility] for tasks assessed/performed       Past Medical History:  Diagnosis Date  . Parkinson's disease ([place])   . REM sleep behavior disorder   . Sleep apnea     Past Surgical History:  Procedure Laterality Date  . APPENDECTOMY    . 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  Subjective Assessment - 05/09/19 1242    Subjective   doing ok, rotating ball is hard    Pertinent History  Parkinson's Disease.  PMH:  newly diagnosed with prostate CA    Patient Stated Goals  improve dexterity and fine motor skills    Currently in Pain?  No/denies        PWR! Moves (basic 4) in modified quadruped with min v.c. for large amplitude and incr effort/deliberate movements.  In standing, functional step and reach to the side to place small pegs in vertical pegboard with each hand with min difficulty/cueing for large amplitude movements.  Copied design accurately.  Practiced buttoning/unbuttoning shirt on table top with min cues for use of PWR! Hands prior to buttoning and use of deliberate/large amplitude movements for fastening and push-pull technique for unfastening after instruction.  Pt demo improvement with repetition and min cues for  use of large amplitude movements.  Then practiced with shirt that pt was wearing with incr ease reported.  Tossing/bouncing ball with therapist, against floor with BUEs and each UE in various positions with focus on deliberate/powerful opening of fingers and extending UEs with min cueing, then progressed to add sequencing for dual task.         OT Long Term Goals - 05/09/19 1304      OT LONG TERM GOAL #1   Title  I with PD specific HEP    Time  5    Period  Weeks    Status  New      OT LONG TERM GOAL #2   Title  Pt will demonstrate improved fine motor coordination for ADLs as evidenced by completing 9 hole peg test for RUE in 33secs or less    Baseline  RUE 36.94, LUE 25.28    Time  5    Period  Weeks    Status  New      OT LONG TERM GOAL #3   Title  Pt will verbalize understanding of adapted strategies for ADLs/IADLs (particularly strategies for writing, cutting food, eating, fasten buttons)    Time  5    Period  Weeks    Status  New      OT LONG TERM GOAL #  4   Title  Pt will demonstrate ability to retrieve a lightweight object at 115 shoulder flexion with -10 elbow extension with RUE.    Baseline  RUE 110, -15, LUE 120, -10    Time  5    Period  Weeks    Status  Achieved   05/09/19:  120* with -10* elbow ext     OT LONG TERM GOAL #5   Title  Pt will demonstrate increased ease with fastening buttons as evidenced by performing 3 button/ unbutton in 32 secs or less.    Baseline  36.09    Time  5    Period  Weeks    Status  New      OT LONG TERM GOAL #6   Title  Pt will demonstrate improved ease with self feeding as evidenced by decreasing PPT#2 to 15 secs or less    Baseline  18.09    Time  5    Period  Weeks    Status  New            Plan - 05/09/19 1258    Clinical Impression Statement  Pt is progressing towards goals with improved RUE ROM.    OT Occupational Profile and History  Problem Focused Assessment - Including review of records relating to  presenting problem    Occupational performance deficits (Please refer to evaluation for details):  ADL's;IADL's;Leisure;Social Participation    Body Structure / Function / Physical Skills  ADL;UE functional use;Balance;Flexibility;FMC;ROM;Gait;Coordination;GMC;Decreased knowledge of precautions;Dexterity;Mobility;Tone;Strength    Rehab Potential  Good    Clinical Decision Making  Limited treatment options, no task modification necessary    Comorbidities Affecting Occupational Performance:  None    Modification or Assistance to Complete Evaluation   No modification of tasks or assist necessary to complete eval    OT Frequency  2x / week    OT Duration  --   5 weeks, plus eval   OT Treatment/Interventions  Self-care/ADL training;Energy conservation;Aquatic Therapy;DME and/or AE instruction;Patient/family education;Balance training;Passive range of motion;Paraffin;Gait Training;Fluidtherapy;Functional Mobility Training;Moist Heat;Therapeutic exercise;Manual Therapy;Therapeutic activities;Neuromuscular education    Plan  check goals, ?d/c    Consulted and Agree with Plan of Care  Patient       Patient will benefit from skilled therapeutic intervention in order to improve the following deficits and impairments:   Body Structure / Function / Physical Skills: ADL, UE functional use, Balance, Flexibility, FMC, ROM, Gait, Coordination, GMC, Decreased knowledge of precautions, Dexterity, Mobility, Tone, Strength       Visit Diagnosis: Other symptoms and signs involving the nervous system  Other symptoms and signs involving the musculoskeletal system  Other lack of coordination  Other abnormalities of gait and mobility  Abnormal posture  Unsteadiness on feet  Stiffness of right elbow, not elsewhere classified    Problem List Patient Active Problem List   Diagnosis Date Noted  . Establishing care with new doctor, encounter for 09/26/2018  . 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  Parkinson's disease ([place]) 11/07/2015  . 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 Hx of colonic polyps 05/18/2014  . BPH (benign prostatic hyperplasia) 01/12/2014  . Obstructive sleep apnea (adult) (pediatric) 01/17/2012  . 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    [facility] 05/09/2019, 2:48 PM  [facility] [address] [place], [place], [address] Phone: [phone]   Fax:  [phone]  Name: [name] [name] MRN: [id number] Date of Birth: 04-09-1941  Vianne Bulls, OTR/L [facility] [address] [place], [place]  [address] [phone] phone [phone] 05/09/19 2:48 PM

## 2019-05-12 ENCOUNTER — Encounter: Payer: Self-pay | Admitting: Occupational Therapy

## 2019-05-12 ENCOUNTER — Other Ambulatory Visit: Payer: Self-pay

## 2019-05-12 ENCOUNTER — Ambulatory Visit: Payer: Medicare Other | Admitting: Occupational Therapy

## 2019-05-12 DIAGNOSIS — R29898 Other symptoms and signs involving the musculoskeletal system: Secondary | ICD-10-CM

## 2019-05-12 DIAGNOSIS — R2689 Other abnormalities of gait and mobility: Secondary | ICD-10-CM | POA: Diagnosis not present

## 2019-05-12 DIAGNOSIS — R293 Abnormal posture: Secondary | ICD-10-CM | POA: Diagnosis not present

## 2019-05-12 DIAGNOSIS — R278 Other lack of coordination: Secondary | ICD-10-CM

## 2019-05-12 DIAGNOSIS — R2681 Unsteadiness on feet: Secondary | ICD-10-CM

## 2019-05-12 DIAGNOSIS — M25621 Stiffness of right elbow, not elsewhere classified: Secondary | ICD-10-CM

## 2019-05-12 DIAGNOSIS — R29818 Other symptoms and signs involving the nervous system: Secondary | ICD-10-CM | POA: Diagnosis not present

## 2019-05-12 NOTE — Therapy (Signed)
[facility] [address] [place], [place], [address] Phone: [phone]   Fax:  [phone]  Occupational Therapy Treatment  Patient Details  Name: [name] [name] MRN: [id number] Date of Birth: 27-Feb-1941 Referring Provider (OT): Dr. Carles Collet   Encounter Date: 05/12/2019  OT End of Session - 05/12/19 1108    Visit Number  6    Number of Visits  11    Date for OT Re-Evaluation  05/26/19    Authorization Type  Medicare/ Tricare    Authorization - Visit Number  6    Authorization - Number of Visits  10    OT Start Time  1106    OT Stop Time  1145    OT Time Calculation (min)  39 min    Activity Tolerance  Patient tolerated treatment well    Behavior During Therapy  [facility] for tasks assessed/performed       Past Medical History:  Diagnosis Date  . Parkinson's disease ([place])   . REM sleep behavior disorder   . Sleep apnea     Past Surgical History:  Procedure Laterality Date  . APPENDECTOMY    . 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  Subjective Assessment - 05/12/19 1108    Subjective   pt reports that he only took 1 pill this morning instead of 3 that he should've had, was doing yardwork and forgot    Pertinent History  Parkinson's Disease.  PMH:  newly diagnosed with prostate CA    Patient Stated Goals  improve dexterity and fine motor skills    Currently in Pain?  No/denies          PWR! Moves (basic 4) in quadruped, standing, and prone x 10-20 each with min cues For incr movement amplitude/technique. (reissued handouts for quadruped and prone)    Checked remaining goals and discussed progress--see below.  Pt reports that he has missed medication in car, as it was near end of session, therapist gave pt cup of water to take meds when he leaves.     OT Education - 05/12/19 1116    Education Details  recommendation to perform quadruped and prone PWR! moves, plank instead of  sit-up to address core strength without increasing rigidity and for improved function.    Person(s) Educated  Patient    Methods  Explanation    Comprehension  Verbalized understanding          OT Long Term Goals - 05/12/19 1133      OT LONG TERM GOAL #1   Title  I with PD specific HEP    Time  5    Period  Weeks    Status  Achieved      OT LONG TERM GOAL #2   Title  Pt will demonstrate improved fine motor coordination for ADLs as evidenced by completing 9 hole peg test for RUE in 33secs or less    Baseline  RUE 36.94, LUE 25.28    Time  5    Period  Weeks    Status  Not Met   05/12/19: 70.10sec (but missed 2 doses of meds today, with significantly incr tremors)     OT LONG TERM GOAL #3   Title  Pt will verbalize understanding of adapted strategies for ADLs/IADLs (particularly strategies for writing, cutting food, eating, fasten buttons)    Time  5    Period  Weeks    Status  Achieved      OT LONG TERM GOAL #4   Title  Pt will demonstrate ability to retrieve a lightweight object at 115 shoulder flexion with -10 elbow extension with RUE.    Baseline  RUE 110, -15, LUE 120, -10    Time  5    Period  Weeks    Status  Achieved   05/09/19:  120* with -10* elbow ext     OT LONG TERM GOAL #5   Title  Pt will demonstrate increased ease with fastening buttons as evidenced by performing 3 button/ unbutton in 32 secs or less.    Baseline  36.09    Time  5    Period  Weeks    Status  Partially Met   05/12/19:  37.75sec, 30.94sec (inconsistent, but missed 2 doses of medication today)     OT LONG TERM GOAL #6   Title  Pt will demonstrate improved ease with self feeding as evidenced by decreasing PPT#2 to 15 secs or less    Baseline  18.09    Time  5    Period  Weeks    Status  Not Met   05/12/19:  24.16sec, 23.21sec (missed 2 doses of meds with incr tremors today)           Plan - 05/12/19 1108    Clinical Impression Statement  Pt is pleased with progress, however, pt demo  incr difficulty with tremors/coordination today due to missing 2 pills of PD meds.    OT Occupational Profile and History  Problem Focused Assessment - Including review of records relating to presenting problem    Occupational performance deficits (Please refer to evaluation for details):  ADL's;IADL's;Leisure;Social Participation    Body Structure / Function / Physical Skills  ADL;UE functional use;Balance;Flexibility;FMC;ROM;Gait;Coordination;GMC;Decreased knowledge of precautions;Dexterity;Mobility;Tone;Strength    Rehab Potential  Good    Clinical Decision Making  Limited treatment options, no task modification necessary    Comorbidities Affecting Occupational Performance:  None    Modification or Assistance to Complete Evaluation   No modification of tasks or assist necessary to complete eval    OT Frequency  2x / week    OT Duration  --   5 weeks, plus eval   OT Treatment/Interventions  Self-care/ADL training;Energy conservation;Aquatic Therapy;DME and/or AE instruction;Patient/family education;Balance training;Passive range of motion;Paraffin;Gait Training;Fluidtherapy;Functional Mobility Training;Moist Heat;Therapeutic exercise;Manual Therapy;Therapeutic activities;Neuromuscular education    Plan  d/c OT; recommend follow-up therapy screen in approx 6 months    Consulted and Agree with Plan of Care  Patient       Patient will benefit from skilled therapeutic intervention in order to improve the following deficits and impairments:   Body Structure / Function / Physical Skills: ADL, UE functional use, Balance, Flexibility, FMC, ROM, Gait, Coordination, GMC, Decreased knowledge of precautions, Dexterity, Mobility, Tone, Strength       Visit Diagnosis: Other symptoms and signs involving the nervous system  Other symptoms and signs involving the musculoskeletal system  Other lack of coordination  Other abnormalities of gait and mobility  Abnormal posture  Unsteadiness on  feet  Stiffness of right elbow, not elsewhere classified    Problem List Patient Active Problem List   Diagnosis Date Noted  . Establishing care with new doctor, encounter for 09/26/2018  . 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 Parkinson's disease ([place]) 11/07/2015  . 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 Hx of colonic polyps 05/18/2014  . BPH (benign prostatic  hyperplasia) 01/12/2014  . Obstructive sleep apnea (adult) (pediatric) 01/17/2012  . 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  Pt verbalized understanding of all education provided.    Plan: Patient agrees to discharge.  Patient goals were partially met. Patient is being discharged due to being pleased with the current functional level.  All goals not met, but pt missed 2 pills of PD meds this morning when re-assesed.   ????       [facility] 05/12/2019, 12:05 PM  [facility] [address] [place], [place], [address] Phone: [phone]   Fax:  [phone]  Name: [name] [name] MRN: [id number] Date of Birth: Feb 19, 1941   Vianne Bulls, OTR/L [facility] [address] [place], [place]  [address] [phone] phone [phone] 05/12/19 12:05 PM

## 2019-05-16 ENCOUNTER — Encounter
Admission: RE | Admit: 2019-05-16 | Discharge: 2019-05-16 | Disposition: A | Discharge status: Home / Self Care | Payer: Medicare Other | Source: Ambulatory Visit | Attending: Urology | Admitting: Urology

## 2019-05-16 ENCOUNTER — Ambulatory Visit
Admission: RE | Admit: 2019-05-16 | Discharge: 2019-05-16 | Disposition: A | Discharge status: Home / Self Care | Payer: Medicare Other | Source: Ambulatory Visit | Attending: Urology | Admitting: Urology

## 2019-05-16 ENCOUNTER — Other Ambulatory Visit: Payer: Self-pay

## 2019-05-16 DIAGNOSIS — C61 Malignant neoplasm of prostate: Secondary | ICD-10-CM | POA: Insufficient documentation

## 2019-05-16 DIAGNOSIS — K5730 Diverticulosis of large intestine without perforation or abscess without bleeding: Secondary | ICD-10-CM | POA: Diagnosis not present

## 2019-05-16 LAB — POCT I-STAT CREATININE: Creatinine, Ser: 1.5 mg/dL — ABNORMAL HIGH (ref 0.61–1.24)

## 2019-05-16 MED ORDER — IOHEXOL 300 MG/ML  SOLN
80.0000 mL | Freq: Once | INTRAMUSCULAR | Status: AC | PRN
  Administered 2019-05-16: 80 mL via INTRAVENOUS

## 2019-05-16 MED ORDER — IOHEXOL 300 MG/ML  SOLN: 100.0000 mL | Freq: Once | INTRAMUSCULAR | Status: DC | PRN

## 2019-05-16 MED ORDER — TECHNETIUM TC 99M MEDRONATE IV KIT
24.0200 | PACK | Freq: Once | INTRAVENOUS | Status: AC | PRN
  Administered 2019-05-16: 10:00:00 24.02 via INTRAVENOUS

## 2019-05-17 ENCOUNTER — Ambulatory Visit: Payer: Medicare Other | Admitting: Urology

## 2019-05-24 ENCOUNTER — Encounter: Payer: Self-pay | Admitting: Urology

## 2019-05-24 ENCOUNTER — Other Ambulatory Visit: Payer: Self-pay

## 2019-05-24 ENCOUNTER — Ambulatory Visit (INDEPENDENT_AMBULATORY_CARE_PROVIDER_SITE_OTHER): Payer: Medicare Other | Admitting: Urology

## 2019-05-24 VITALS — BP 114/71 | HR 48 | Ht 69.0 in | Wt 158.6 lb

## 2019-05-24 DIAGNOSIS — C7951 Secondary malignant neoplasm of bone: Secondary | ICD-10-CM | POA: Diagnosis not present

## 2019-05-24 DIAGNOSIS — C61 Malignant neoplasm of prostate: Secondary | ICD-10-CM | POA: Diagnosis not present

## 2019-05-24 MED ORDER — LEUPROLIDE ACETATE (4 MONTH) 30 MG IM KIT
30.0000 mg | PACK | Freq: Once | INTRAMUSCULAR | Status: AC
  Administered 2019-05-24: 30 mg via INTRAMUSCULAR

## 2019-05-24 NOTE — Progress Notes (Signed)
   05/24/2019 1:05 PM   [name] [name] 28-Oct-1940 [id number]  Reason for visit: Discuss prostate biopsy results  HPI: I saw [name] [name] in urology clinic to discuss his prostate biopsy results.  Briefly, he is a 78 year old relatively healthy male with well-controlled Parkinson's disease who was referred to me for an elevated PSA of 26.5.  His DRE was grossly abnormal and nodular consistent with prostate cancer.  He underwent prostate biopsy on 04/17/2019 which showed a 36 g prostate with a PSA density of 0.73, and all 12 cores were positive for prostate adenocarcinoma, almost entirely Gleason score 4+5 = 9, with almost all cores greater than 80% involvement.  Staging imaging performed on 05/16/2019 with bone scan and CT abdomen pelvis with contrast showed a foci of uptake in the left ilium and left ischial tuberosity consistent with metastatic disease, particularly given increase in size from May 2020.  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  We discussed there is not a role for prostatectomy in patients with metastatic disease, especially with his advanced age.  We discussed treatment typically consist of hormone therapy, antiandrogens, and even some newer medications that have been improved in the last few years.  We reviewed that medical oncology typically manages patients with metastatic prostate cancer, and I placed a referral to them.  He was given a 46-month depot Lupron injection today, and we discussed the risks and benefits at length.  We discussed ADT as a mainstay of treatment for metastatic prostate cancer, and we will continued to trend the PSA and I would defer further imaging and follow-up imaging to medical oncology.  Finally, he does have some urinary symptoms at baseline of weak stream and feeling of incomplete emptying.  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   Flomax is only minimally improved his symptoms.  I discussed with him that patients on ADT will often have significant improvement in their urinary symptoms as the prostate typically shrinks, however we could consider a TURP in the future if he continues to have severe urinary symptoms in the future despite ADT.  Referral placed to medical oncology for metastatic prostate cancer RTC 4 months for repeat Lupron and discuss urinary symptoms, PVR  A total of 25 minutes were spent face-to-face with the patient, greater than 50% was spent in patient education, counseling, and coordination of care regarding new diagnosis of metastatic prostate cancer.  [place], [facility] [address] [place], [place] [address] [phone]

## 2019-05-24 NOTE — Progress Notes (Signed)
Lupron IM Injection   Due to Prostate Cancer patient is present today for a Lupron Injection.  Medication: Lupron 4 month Dose: 30 mg  Location: left upper outer buttocks Lot: [id number] Exp: 04/27/2021  Patient tolerated well, no complications were noted  Performed by: Fonnie Jarvis, CMA  Follow up: 78mo

## 2019-05-24 NOTE — Patient Instructions (Signed)

## 2019-05-26 ENCOUNTER — Other Ambulatory Visit: Payer: Self-pay

## 2019-05-29 ENCOUNTER — Encounter: Payer: Self-pay | Admitting: Internal Medicine

## 2019-05-29 ENCOUNTER — Inpatient Hospital Stay: Payer: Medicare Other | Attending: Internal Medicine | Admitting: Internal Medicine

## 2019-05-29 ENCOUNTER — Other Ambulatory Visit: Payer: Self-pay

## 2019-05-29 DIAGNOSIS — Z8249 Family history of ischemic heart disease and other diseases of the circulatory system: Secondary | ICD-10-CM | POA: Diagnosis not present

## 2019-05-29 DIAGNOSIS — Z905 Acquired absence of kidney: Secondary | ICD-10-CM | POA: Diagnosis not present

## 2019-05-29 DIAGNOSIS — K5730 Diverticulosis of large intestine without perforation or abscess without bleeding: Secondary | ICD-10-CM

## 2019-05-29 DIAGNOSIS — I700 Atherosclerosis of aorta: Secondary | ICD-10-CM

## 2019-05-29 DIAGNOSIS — Z79899 Other long term (current) drug therapy: Secondary | ICD-10-CM

## 2019-05-29 DIAGNOSIS — G20 Parkinson's disease: Secondary | ICD-10-CM | POA: Insufficient documentation

## 2019-05-29 DIAGNOSIS — N400 Enlarged prostate without lower urinary tract symptoms: Secondary | ICD-10-CM | POA: Diagnosis not present

## 2019-05-29 DIAGNOSIS — Z833 Family history of diabetes mellitus: Secondary | ICD-10-CM

## 2019-05-29 DIAGNOSIS — Z803 Family history of malignant neoplasm of breast: Secondary | ICD-10-CM | POA: Insufficient documentation

## 2019-05-29 DIAGNOSIS — C7951 Secondary malignant neoplasm of bone: Secondary | ICD-10-CM | POA: Insufficient documentation

## 2019-05-29 DIAGNOSIS — C61 Malignant neoplasm of prostate: Secondary | ICD-10-CM | POA: Insufficient documentation

## 2019-05-29 DIAGNOSIS — R3915 Urgency of urination: Secondary | ICD-10-CM | POA: Insufficient documentation

## 2019-05-29 DIAGNOSIS — K7689 Other specified diseases of liver: Secondary | ICD-10-CM | POA: Diagnosis not present

## 2019-05-29 DIAGNOSIS — K429 Umbilical hernia without obstruction or gangrene: Secondary | ICD-10-CM | POA: Insufficient documentation

## 2019-05-29 DIAGNOSIS — R350 Frequency of micturition: Secondary | ICD-10-CM | POA: Diagnosis not present

## 2019-05-29 DIAGNOSIS — R3912 Poor urinary stream: Secondary | ICD-10-CM | POA: Diagnosis not present

## 2019-05-29 NOTE — Assessment & Plan Note (Addendum)
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  Long discussion the patient regarding stage and/the fact that unfortunately this cannot be cured.  Discussed the median survival for castrate sensitive prostate cancer metastatic-is anywhere between 3 to 5 years.  # I reviewed the data from STAMPEDE/CHAARTED clinical trials-in patients with high-risk /castrate sensitive metastatic prostate cancer; that have shown the benefit of chemotherapy-Taxotere every 3 weeks x 6 cycles-is an option.  #I also reviewed the data of from LATITUDE study, given the use of abiraterone plus prednisone-showed to improve survival benefit [median survival-53 versus 36 months.]  This would be a reasonable option given his age/oligometastatic disease/Parkinson's disease.   #Patient leaning towards the addition of abiraterone plus prednisone; encouraged the patient to talk to family/, with questions to discuss at next visit.  Will start prescription next visit.  #Difficulty with urination-stable.  Continue follow-up with urology.  # Genetics counseling-patient's daughter with breast cancer in her 72s [survived]; discussed genetic counseling/testing.  This would also help therapeutically back for the patient.  Will defer to next visit.  #We will speak to patient's wife as per his request.  Thank you Dr.Sninski for allowing me to participate in the care of your pleasant patient. Please do not hesitate to contact me with questions or concerns in the interim.  # DISPOSITION: # follow up in 3 weeks-MD- labs- cbc/cmp/PSA-Dr.B  # I reviewed the blood work- with the patient in detail; also reviewed the imaging independently [as summarized above]; and with the patient in detail.   I spoke at length with the patient's family/wife- regarding the patient's clinical status/plan of care/prognosis as discussed above.  Family agreement.

## 2019-05-29 NOTE — Progress Notes (Signed)
[facility] NOTE  Patient Care Team: Juline Patch, MD as PCP - General (Family Medicine) Tat, Eustace Quail, DO as Consulting Physician (Neurology)  CHIEF COMPLAINTS/PURPOSE OF CONSULTATION: PROSTATE CANCER  #  Oncology History Overview Note  # PROSTATE CANCER- METATSTATIC to BONE; PSA- 26.5. Sclerotic 1.5 cm left ischial lesion/ Sclerotic medial left iliac bone 1.5 cm lesion (series 2/image 47), increased from 1.0 cm. Gleason score of 4+5= 9; with almost all cores involved greater than 80%.  9/16 Lupron 78-month depot on 9/16. 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  male history of Parkinson's disease well-controlled; has been referred to urology regarding diagnosis of prostate cancer.  Patient had a prostate biopsy that showed a Gleason score of 4+5= 9; with almost all cores involved greater than 80%.  Pretreatment PSA 26.5.  Patient received Lupron 77-month depot on 9/16.  CT scan abdomen pelvis-left ilium/ischial tuberosity uptake.  Referred to [place] for further evaluation and recommendations for metastatic prostate cancer.  Patient continues to complain of a weak urinary stream.  Otherwise denies any significant pain.  No nausea no vomiting but no headaches.  Finally, he does have some urinary symptoms at baseline of weak stream and feeling of incomplete emptying.  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  Flomax is only minimally improved his symptoms.  I discussed with him that patients on ADT will often have significant improvement in their  urinary symptoms as the prostate typically shrinks, however we could consider a TURP in the future if he continues to have severe urinary symptoms in the future despite ADT.  Review of Systems  Constitutional: Negative for chills, diaphoresis, fever, malaise/fatigue and weight loss.  HENT: Negative for nosebleeds and sore throat.   Eyes: Negative for double vision.  Respiratory: Negative for cough, hemoptysis, sputum production, shortness of breath and wheezing.   Cardiovascular: Negative for chest pain, palpitations, orthopnea and leg swelling.  Gastrointestinal: Negative for abdominal pain, blood in stool, constipation, diarrhea, heartburn, melena, nausea and vomiting.  Genitourinary: Positive for frequency and urgency. Negative for dysuria.  Musculoskeletal: Negative for back pain and joint pain.  Skin: Negative.  Negative for itching and rash.  Neurological: Positive for tremors. Negative for dizziness, tingling, focal weakness, weakness and headaches.  Endo/Heme/Allergies: Does not bruise/bleed easily.  Psychiatric/Behavioral: Negative for depression. The patient is not nervous/anxious and does not have insomnia.     MEDICAL HISTORY:  Past Medical History:  Diagnosis Date  . Parkinson's disease ([place])   . REM sleep behavior disorder   . Sleep apnea     SURGICAL HISTORY: Past Surgical History:  Procedure Laterality Date  . APPENDECTOMY    . 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 Marital status: Married    Spouse name: Not on file  . Number of children: Not on file  . Years of education: Not on file  . Highest education level: Not on file  Occupational History  . Occupation: retired  Comment: Nutritional therapist  Social Needs  . Financial resource strain: Not on file  . Food insecurity    Worry: Not on file    Inability: Not on file  . Transportation needs    Medical: Not on file    Non-medical: Not on file   Tobacco Use  . Smoking status: Former Smoker    Types: Cigarettes, Cigars    Quit date: 09/07/2010    Years since quitting: 8.7  . Smokeless tobacco: Never Used  Substance and Sexual Activity  . Alcohol use: Yes    Comment: 1 can beer/day  . Drug use: Never  . Sexual activity: Not Currently  Lifestyle  . Physical activity    Days per week: Not on file    Minutes per session: Not on file  . Stress: Not on file  Relationships  . Social Herbalist on phone: Not on file    Gets together: Not on file    Attends religious service: Not on file    Active member of club or organization: Not on file    Attends meetings of clubs or organizations: Not on file    Relationship status: Not on file  . Intimate partner violence    Fear of current or ex partner: Not on file    Emotionally abused: Not on file    Physically abused: Not on file    Forced sexual activity: Not on file  Other Topics Concern  . Not on file  Social History Narrative   Lives in [place]; quit smoking in 2012; ocassional alcohol; Camera operator; wife; 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     FAMILY HISTORY: Family History  Problem Relation Age of Onset  . Heart disease Maternal Grandfather   . Diabetes Paternal Grandfather   . Breast cancer Daughter     ALLERGIES:  is allergic to influenza vaccines.  MEDICATIONS:  Current Outpatient Medications  Medication Sig Dispense Refill  . carbidopa-levodopa (SINEMET IR) 25-100 MG tablet Take 2 tablets by mouth 3 (three) times daily. TAKE 2 tablets at 7AM, 1 tablet at 11AM, 2 tablets at 3PM, 1 tablet at 7PM    . Glycerin-Hypromellose-PEG 400 (VISINE DRY EYE OP) Apply to eye. otc    . ibuprofen (ADVIL,MOTRIN) 600 MG tablet Take by mouth as needed. otc    . ketoconazole (NIZORAL) 2 % shampoo     . tamsulosin (FLOMAX) 0.4 MG CAPS capsule Take 1 capsule (0.4 mg total) by mouth daily. 90 capsule 3  . clonazePAM (KLONOPIN) 0.5 MG tablet Take 1 tablet by mouth at bedtime. Dr  Anna Genre     No current facility-administered medications for this visit.       Marland Kitchen  PHYSICAL EXAMINATION: ECOG PERFORMANCE STATUS: 0 - Asymptomatic  Vitals:   05/29/19 0902  BP: 115/78  Pulse: (!) 51  Resp: 16  Temp: 97.8 F (36.6 C)   Filed Weights   05/29/19 0902  Weight: 159 lb 14.4 oz (72.5 kg)    Physical Exam  Constitutional: He is oriented to person, place, and time and well-developed, well-nourished, and in no distress.  HENT:  Head: Normocephalic and atraumatic.  Mouth/Throat: Oropharynx is clear and moist. No oropharyngeal exudate.  Eyes: Pupils are equal, round, and reactive to light.  Neck: Normal range of motion. Neck supple.  Cardiovascular: Normal rate and regular rhythm.  Pulmonary/Chest: No respiratory distress. He has no wheezes.  Abdominal: Soft. Bowel sounds are normal. He exhibits no distension and no mass. There is  no abdominal tenderness. There is no rebound and no guarding.  Musculoskeletal: Normal range of motion.        General: No tenderness or edema.  Neurological: He is alert and oriented to person, place, and time.  Tremor of right upper extremity.  Skin: Skin is warm.  Psychiatric: Affect normal.     LABORATORY DATA:  I have reviewed the data as listed Lab Results  Component Value Date   WBC 6.6 01/11/2019   HGB 15.7 01/11/2019   HCT 46.6 01/11/2019   MCV 92 01/11/2019   PLT 317 01/11/2019   Recent Labs    01/07/19 1541 05/16/19 1036  NA 135  --   K 3.8  --   CL 100  --   CO2 26  --   GLUCOSE 102*  --   BUN 13  --   CREATININE 1.29* 1.50*  CALCIUM 9.1  --   GFRNONAA 53*  --   GFRAA >60  --   PROT 7.5  --   ALBUMIN 4.1  --   AST 22  --   ALT <5  --   ALKPHOS 68  --   BILITOT 2.6*  --     RADIOGRAPHIC STUDIES: I have personally reviewed the radiological images as listed and agreed with the findings in the report. Nm Bone Scan Whole Body  Result Date: 05/16/2019 CLINICAL DATA:  New diagnosis prostate carcinoma.  EXAM: NUCLEAR MEDICINE WHOLE BODY BONE SCAN TECHNIQUE: Whole body anterior and posterior images were obtained approximately 3 hours after intravenous injection of radiopharmaceutical. RADIOPHARMACEUTICALS:  24.02 mCi Technetium-67m MDP IV COMPARISON:  CT abdomen and pelvis this same day and 01/07/2019. FINDINGS: Foci of abnormal increased uptake are seen in the inferior aspect of the left ischium and in the right ilium adjacent to the SI joint and correlate with sclerotic lesions seen on the prior CT scans. Osseous uptake is otherwise normal. The patient has a solitary right kidney. Soft tissues are otherwise unremarkable. IMPRESSION: Foci of increased uptake in the left ilium and left ischial tuberosity are most likely due to metastatic disease particularly given increase in size as reported on CT scan abdomen and pelvis today. Electronically Signed   By: Inge Rise M.D.   On: 05/16/2019 14:06   Ct Abdomen Pelvis W Contrast  Result Date: 05/16/2019 CLINICAL DATA:  High-risk prostate cancer. Staging evaluation. History of left renal donation and appendectomy. EXAM: CT ABDOMEN AND PELVIS WITH CONTRAST TECHNIQUE: Multidetector CT imaging of the abdomen and pelvis was performed using the standard protocol following bolus administration of intravenous contrast. CONTRAST:  94mL OMNIPAQUE IOHEXOL 300 MG/ML  SOLN COMPARISON:  01/07/2027 CT abdomen/pelvis. FINDINGS: Lower chest: No significant pulmonary nodules or acute consolidative airspace disease. Hepatobiliary: Normal liver size. Simple 1.2 cm central liver cyst. Additional scattered subcentimeter hypodense liver lesions are too small to characterize and unchanged, probably benign. No new liver lesions. Normal gallbladder with no radiopaque cholelithiasis. No biliary ductal dilatation. Pancreas: Normal, with no mass or duct dilation. Spleen: Normal size. No mass. Adrenals/Urinary Tract: Normal adrenals. Status post left nephrectomy. No mass or fluid collection  in the left nephrectomy bed. Normal size right kidney. No right hydronephrosis. Subcentimeter hypodense renal cortical lesion in the posterior interpolar right kidney is too small to characterize and requires no follow-up. No additional right renal lesions. Normal bladder. Stomach/Bowel: Normal non-distended stomach. Normal caliber small bowel with no small bowel wall thickening. Appendectomy. Moderate sigmoid diverticulosis, with no large bowel wall thickening or significant pericolonic  fat stranding. Vascular/Lymphatic: Atherosclerotic nonaneurysmal abdominal aorta. Patent portal, splenic, hepatic and right renal veins. No pathologically enlarged lymph nodes in the abdomen or pelvis. Reproductive: Mildly enlarged prostate with heterogeneous enhancement. Other: No pneumoperitoneum, ascites or focal fluid collection. Tiny fat containing umbilical hernia unchanged. Musculoskeletal: Sclerotic 1.5 cm left ischial lesion (series 2/image 76), mildly increased from 1.3 cm on 01/07/2019 CT. Sclerotic medial left iliac bone 1.5 cm lesion (series 2/image 47), increased from 1.0 cm. Mild lumbar spondylosis. IMPRESSION: 1. Two sclerotic osseous lesions in the left pelvis have increased in size since 01/07/2019 CT, suspicious for osseous metastases. 2. No lymphadenopathy or other findings of metastatic disease in the abdomen or pelvis. 3. Mildly enlarged prostate. 4. Moderate sigmoid diverticulosis. 5.  Aortic Atherosclerosis (ICD10-I70.0). Electronically Signed   By: Ilona Sorrel M.D.   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  Long discussion the patient regarding stage and/the fact that unfortunately this cannot be cured.  Discussed the median survival for castrate sensitive prostate cancer metastatic-is anywhere between 3 to 5 years.  # I reviewed the data from  STAMPEDE/CHAARTED clinical trials-in patients with high-risk /castrate sensitive metastatic prostate cancer; that have shown the benefit of chemotherapy-Taxotere every 3 weeks x 6 cycles-is an option.  #I also reviewed the data of from LATITUDE study, given the use of abiraterone plus prednisone-showed to improve survival benefit [median survival-53 versus 36 months.]  This would be a reasonable option given his age/oligometastatic disease/Parkinson's disease.   #Patient leaning towards the addition of abiraterone plus prednisone; encouraged the patient to talk to family/, with questions to discuss at next visit.  Will start prescription next visit.  #Difficulty with urination-stable.  Continue follow-up with urology.  # Genetics counseling-patient's daughter with breast cancer in her 56s [survived]; discussed genetic counseling/testing.  This would also help therapeutically back for the patient.  Will defer to next visit.  #We will speak to patient's wife as per his request.  Thank you Dr.Sninski for allowing me to participate in the care of your pleasant patient. Please do not hesitate to contact me with questions or concerns in the interim.  # DISPOSITION: # follow up in 3 weeks-MD- labs- cbc/cmp/PSA-Dr.B  # I reviewed the blood work- with the patient in detail; also reviewed the imaging independently [as summarized above]; and with the patient in detail.   All questions were answered. The patient knows to call the clinic with any problems, questions or concerns.    Cammie Sickle, MD 05/29/2019 10:13 AM

## 2019-06-16 ENCOUNTER — Other Ambulatory Visit: Payer: Self-pay

## 2019-06-16 ENCOUNTER — Encounter: Payer: Self-pay | Admitting: Internal Medicine

## 2019-06-16 NOTE — Progress Notes (Signed)
Patient pre screened for office appointment, no questions or concerns today. 

## 2019-06-19 ENCOUNTER — Inpatient Hospital Stay (HOSPITAL_BASED_OUTPATIENT_CLINIC_OR_DEPARTMENT_OTHER): Payer: Medicare Other | Admitting: Internal Medicine

## 2019-06-19 ENCOUNTER — Other Ambulatory Visit: Payer: Self-pay

## 2019-06-19 ENCOUNTER — Telehealth: Payer: Self-pay | Admitting: Pharmacist

## 2019-06-19 ENCOUNTER — Telehealth: Payer: Self-pay | Admitting: Internal Medicine

## 2019-06-19 ENCOUNTER — Inpatient Hospital Stay: Payer: Medicare Other | Attending: Internal Medicine

## 2019-06-19 ENCOUNTER — Telehealth: Payer: Self-pay | Admitting: Pharmacy Technician

## 2019-06-19 DIAGNOSIS — R350 Frequency of micturition: Secondary | ICD-10-CM | POA: Diagnosis not present

## 2019-06-19 DIAGNOSIS — Z833 Family history of diabetes mellitus: Secondary | ICD-10-CM | POA: Insufficient documentation

## 2019-06-19 DIAGNOSIS — N400 Enlarged prostate without lower urinary tract symptoms: Secondary | ICD-10-CM | POA: Diagnosis not present

## 2019-06-19 DIAGNOSIS — C61 Malignant neoplasm of prostate: Secondary | ICD-10-CM

## 2019-06-19 DIAGNOSIS — Z803 Family history of malignant neoplasm of breast: Secondary | ICD-10-CM | POA: Insufficient documentation

## 2019-06-19 DIAGNOSIS — C7951 Secondary malignant neoplasm of bone: Secondary | ICD-10-CM

## 2019-06-19 DIAGNOSIS — Z79899 Other long term (current) drug therapy: Secondary | ICD-10-CM | POA: Insufficient documentation

## 2019-06-19 DIAGNOSIS — G20 Parkinson's disease: Secondary | ICD-10-CM | POA: Diagnosis not present

## 2019-06-19 DIAGNOSIS — R232 Flushing: Secondary | ICD-10-CM | POA: Diagnosis not present

## 2019-06-19 DIAGNOSIS — Z8249 Family history of ischemic heart disease and other diseases of the circulatory system: Secondary | ICD-10-CM | POA: Insufficient documentation

## 2019-06-19 DIAGNOSIS — N183 Chronic kidney disease, stage 3 unspecified: Secondary | ICD-10-CM | POA: Insufficient documentation

## 2019-06-19 LAB — COMPREHENSIVE METABOLIC PANEL
ALT: <5 U/L (ref 0–44)
AST: 29 U/L (ref 15–41)
Albumin: 4.1 g/dL (ref 3.5–5.0)
Alkaline Phosphatase: 51 U/L (ref 38–126)
Anion gap: 8 (ref 5–15)
BUN: 20 mg/dL (ref 8–23)
CO2: 25 mmol/L (ref 22–32)
Calcium: 9.5 mg/dL (ref 8.9–10.3)
Chloride: 106 mmol/L (ref 98–111)
Creatinine, Ser: 1.42 mg/dL — ABNORMAL HIGH (ref 0.61–1.24)
GFR calc Af Amer: 55 mL/min — ABNORMAL LOW (ref >60)
GFR calc non Af Amer: 47 mL/min — ABNORMAL LOW (ref >60)
Glucose, Bld: 111 mg/dL — ABNORMAL HIGH (ref 70–99)
Potassium: 4.1 mmol/L (ref 3.5–5.1)
Sodium: 139 mmol/L (ref 135–145)
Total Bilirubin: 1.9 mg/dL — ABNORMAL HIGH (ref 0.3–1.2)
Total Protein: 7.4 g/dL (ref 6.5–8.1)

## 2019-06-19 LAB — CBC WITH DIFFERENTIAL/PLATELET
Abs Immature Granulocytes: 0.02 10*3/uL (ref 0.00–0.07)
Basophils Absolute: 0.1 10*3/uL (ref 0.0–0.1)
Basophils Relative: 1 %
Eosinophils Absolute: 0.2 10*3/uL (ref 0.0–0.5)
Eosinophils Relative: 2 %
HCT: 44.6 % (ref 39.0–52.0)
Hemoglobin: 14.7 g/dL (ref 13.0–17.0)
Immature Granulocytes: 0 %
Lymphocytes Relative: 23 %
Lymphs Abs: 1.7 10*3/uL (ref 0.7–4.0)
MCH: 30.9 pg (ref 26.0–34.0)
MCHC: 33 g/dL (ref 30.0–36.0)
MCV: 93.9 fL (ref 80.0–100.0)
Monocytes Absolute: 0.7 10*3/uL (ref 0.1–1.0)
Monocytes Relative: 9 %
Neutro Abs: 4.8 10*3/uL (ref 1.7–7.7)
Neutrophils Relative %: 65 %
Platelets: 202 10*3/uL (ref 150–400)
RBC: 4.75 MIL/uL (ref 4.22–5.81)
RDW: 13 % (ref 11.5–15.5)
WBC: 7.4 10*3/uL (ref 4.0–10.5)
nRBC: 0 % (ref 0.0–0.2)

## 2019-06-19 LAB — PSA: Prostatic Specific Antigen: 15.87 ng/mL — ABNORMAL HIGH (ref 0.00–4.00)

## 2019-06-19 MED ORDER — ABIRATERONE ACETATE 250 MG PO TABS: 1000.0000 mg | ORAL_TABLET | Freq: Every day | ORAL | 4 refills | Status: DC

## 2019-06-19 MED ORDER — PREDNISONE 10 MG PO TABS: 10.0000 mg | ORAL_TABLET | Freq: Every day | ORAL | 6 refills | Status: DC

## 2019-06-19 NOTE — Assessment & Plan Note (Addendum)
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  #After lengthy discussion at last visit chemotherapy versus Zytiga/prednisone-patient is willing to proceed with Zytiga plus prednisone therapy.  Discussed the potential side effects including but not limited to leg swelling hypokalemia LFTs abnormalities elevated blood pressure etc.  Recommend prednisone 10 mg a day.  Prescription started.  Discussed with pharmacy.  Patient can get started when the medication available.  #Difficulty with urination-stable.  Continue follow-up with urology.  # Genetics counseling-patient's daughter with breast cancer in her 19s [survived]; discussed genetic counseling/testing.  Will refer next visit.  # I spoke at length with the patient's family/wife- regarding the patient's clinical status/plan of care.  Family agreement.   # DISPOSITION: # follow up in 4 weeks-MD- labs- cbc/cmp/PSA-Dr.B

## 2019-06-19 NOTE — Telephone Encounter (Signed)
Left a message for patient's wife/-with an update; PSA improving.

## 2019-06-19 NOTE — Telephone Encounter (Signed)
Oral Oncology Patient Advocate Encounter  Received notification from [employer] that prior authorization for Zytiga (Abiraterone) is required.  PA submitted on CoverMyMeds Key [id number]  Status is pending  Oral Oncology Clinic will continue to follow.   [name] [name] Phone [phone] Fax [phone] 06/19/2019 11:48 AM

## 2019-06-19 NOTE — Telephone Encounter (Signed)
Oral Oncology Patient Advocate Encounter  Prior Authorization for Abiraterone Fabio Asa) has been approved.    PA# [id number] Effective dates: 05/20/2019 through 09/06/2098  Patients co-pay is $13.00  Oral Oncology Clinic will continue to follow.   [place] [facility] [place] Phone [phone] Fax [phone] 06/19/2019 11:49 AM

## 2019-06-19 NOTE — Telephone Encounter (Signed)
Oral Chemotherapy Pharmacist Encounter  Patient Education I spoke with patient for overview of new oral chemotherapy medication: Zytiga (abiraterone) for the treatment of newly diagnosed metastatic castrate sensitive prostate cancer in conjunction with Lupron, planned duration until disease progression or unacceptable drug toxicity.  Counseled patient on administration, dosing, side effects, monitoring, drug-food interactions, safe handling, storage, and disposal. Patient will take abiraterone 4 tablets (1,000 mg total) by mouth daily. Take on an empty stomach 1 hour before or 2 hours after a meal.  He will also take prednisone 1 tablet (10 mg total) by mouth daily with breakfast  Side effects include but not limited to: HTN, fatigue, decreased wbc.    Reviewed with patient importance of keeping a medication schedule and plan for any missed doses.  [name] [name] voiced understanding and appreciation. All questions answered. Medication handout placed in the mail.  Provided patient with [facility] phone number. Patient knows to call the office with questions or concerns. Oral Chemotherapy Navigation Clinic will continue to follow.  Darl Pikes, PharmD, BCPS, [facility] Hematology/Oncology Clinical Pharmacist [facility]/HP/AP [facility] [phone]  06/19/2019 3:05 PM

## 2019-06-19 NOTE — Progress Notes (Signed)
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 Sclerotic 1.5 cm left ischial lesion/ Sclerotic medial left iliac bone 1.5 cm lesion (series 2/image 47), increased from 1.0 cm. Gleason score of 4+5= 9; with almost all cores involved greater than 80%.  9/16 Lupron 6-month depot on 9/16. 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  Patient denies any worsening joint pains or bone pain.  Appetite is fair.  Continues to have increased frequency of urination not any worse.  Intermittent hot flashes.   Review of Systems  Constitutional: Negative for chills, diaphoresis, fever, malaise/fatigue and weight loss.  HENT: Negative for nosebleeds and sore throat.   Eyes: Negative for double vision.  Respiratory: Negative for cough, hemoptysis, sputum production, shortness of breath and wheezing.   Cardiovascular: Negative for chest pain, palpitations, orthopnea and leg swelling.  Gastrointestinal: Negative for abdominal pain, blood in stool, constipation, diarrhea, heartburn, melena, nausea and vomiting.  Genitourinary: Positive for frequency and urgency. Negative for dysuria.  Musculoskeletal: Negative for back  pain and joint pain.  Skin: Negative.  Negative for itching and rash.  Neurological: Positive for tremors. Negative for dizziness, tingling, focal weakness, weakness and headaches.  Endo/Heme/Allergies: Does not bruise/bleed easily.  Psychiatric/Behavioral: Negative for depression. The patient is not nervous/anxious and does not have insomnia.     MEDICAL HISTORY:  Past Medical History:  Diagnosis Date  . Parkinson's disease ([place])   . REM sleep behavior disorder   . Sleep apnea     SURGICAL HISTORY: Past Surgical History:  Procedure Laterality Date  . APPENDECTOMY    . 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 Marital status: Married    Spouse name: Not on file  . Number of children: Not on file  . Years of education: Not on file  . Highest education level: Not on file  Occupational History  . Occupation: retired    Comment: Nutritional therapist  Social Needs  . Financial resource strain: Not on file  . Food insecurity    Worry: Not on file    Inability: Not on file  . Transportation needs    Medical: Not on file    Non-medical: Not on file  Tobacco Use  . Smoking status: Former Smoker    Types: Cigarettes, Cigars    Quit date: 09/07/2010    Years since quitting: 8.7  . Smokeless tobacco: Never Used  Substance and Sexual Activity  . Alcohol use: Yes    Comment: 1 can beer/day  . Drug use: Never  . Sexual activity: Not Currently  Lifestyle  . Physical activity    Days per week: Not on file    Minutes per session: Not  on file  . Stress: Not on file  Relationships  . Social Herbalist on phone: Not on file    Gets together: Not on file    Attends religious service: Not on file    Active member of club or organization: Not on file    Attends meetings of clubs or organizations: Not on file    Relationship status: Not on file  . Intimate partner violence    Fear of current or ex partner: Not on file     Emotionally abused: Not on file    Physically abused: Not on file    Forced sexual activity: Not on file  Other Topics Concern  . 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     FAMILY HISTORY: Family History  Problem Relation Age of Onset  . Heart disease Maternal Grandfather   . Diabetes Paternal Grandfather   . Breast cancer Daughter     ALLERGIES:  is allergic to influenza vaccines.  MEDICATIONS:  Current Outpatient Medications  Medication Sig Dispense Refill  . carbidopa-levodopa (SINEMET IR) 25-100 MG tablet Take 2 tablets by mouth 3 (three) times daily. TAKE 2 tablets at 7AM, 1 tablet at 11AM, 2 tablets at 3PM, 1 tablet at 7PM    . clonazePAM (KLONOPIN) 0.5 MG tablet Take 1 tablet by mouth at bedtime. Dr Anna Genre    . Glycerin-Hypromellose-PEG 400 (VISINE DRY EYE OP) Apply to eye. otc    . ibuprofen (ADVIL,MOTRIN) 600 MG tablet Take by mouth as needed. otc    . ketoconazole (NIZORAL) 2 % shampoo     . tamsulosin (FLOMAX) 0.4 MG CAPS capsule Take 1 capsule (0.4 mg total) by mouth daily. 90 capsule 3  . abiraterone acetate (ZYTIGA) 250 MG tablet Take 4 tablets (1,000 mg total) by mouth daily. Take on an empty stomach 1 hour before or 2 hours after a meal 120 tablet 4  . predniSONE (DELTASONE) 10 MG tablet Take 1 tablet (10 mg total) by mouth daily with breakfast. 30 tablet 6   No current facility-administered medications for this visit.       Marland Kitchen  PHYSICAL EXAMINATION: ECOG PERFORMANCE STATUS: 0 - Asymptomatic  Vitals:   06/19/19 0954  BP: 117/77  Pulse: (!) 49  Temp: 98 F (36.7 C)   Filed Weights   06/19/19 0954  Weight: 157 lb (71.2 kg)    Physical Exam  Constitutional: He is oriented to person, place, and time and well-developed, well-nourished, and in no distress.  HENT:  Head: Normocephalic and atraumatic.  Mouth/Throat: Oropharynx is clear and  moist. No oropharyngeal exudate.  Eyes: Pupils are equal, round, and reactive to light.  Neck: Normal range of motion. Neck supple.  Cardiovascular: Normal rate and regular rhythm.  Pulmonary/Chest: No respiratory distress. He has no wheezes.  Abdominal: Soft. Bowel sounds are normal. He exhibits no distension and no mass. There is no abdominal tenderness. There is no rebound and no guarding.  Musculoskeletal: Normal range of motion.        General: No tenderness or edema.  Neurological: He is alert and oriented to person, place, and time.  Tremor of right upper extremity.  Skin: Skin is warm.  Psychiatric: Affect normal.     LABORATORY DATA:  I have reviewed the data as listed Lab Results  Component Value Date   WBC 7.4 06/19/2019   HGB 14.7  06/19/2019   HCT 44.6 06/19/2019   MCV 93.9 06/19/2019   PLT 202 06/19/2019   Recent Labs    01/07/19 1541 05/16/19 1036 06/19/19 0925  NA 135  --  139  K 3.8  --  4.1  CL 100  --  106  CO2 26  --  25  GLUCOSE 102*  --  111*  BUN 13  --  20  CREATININE 1.29* 1.50* 1.42*  CALCIUM 9.1  --  9.5  GFRNONAA 53*  --  47*  GFRAA >60  --  55*  PROT 7.5  --  7.4  ALBUMIN 4.1  --  4.1  AST 22  --  29  ALT <5  --  <5  ALKPHOS 68  --  51  BILITOT 2.6*  --  1.9*    RADIOGRAPHIC STUDIES: I have personally reviewed the radiological images as listed and agreed with the findings in the report. No results found.  ASSESSMENT & PLAN:   Prostate cancer metastatic to bone ([place]) # Castrate sensitive prostate cancer-metastatic to bone-left ischial tuberosity/ilium-oligometastatic.  #After lengthy discussion at last visit chemotherapy versus Zytiga/prednisone-patient is willing to proceed with Zytiga plus prednisone therapy.  Discussed the potential side effects including but not limited to leg swelling hypokalemia LFTs abnormalities elevated blood pressure etc.  Recommend prednisone 10 mg a day.  Prescription started.  Discussed with pharmacy.   Patient can get started when the medication available.  #Difficulty with urination-stable.  Continue follow-up with urology.  # Genetics counseling-patient's daughter with breast cancer in her 42s [survived]; discussed genetic counseling/testing.  Will refer next visit.  # I spoke at length with the patient's family/wife- regarding the patient's clinical status/plan of care.  Family agreement.   # DISPOSITION: # follow up in 4 weeks-MD- labs- cbc/cmp/PSA-Dr.B   All questions were answered. The patient knows to call the clinic with any problems, questions or concerns.    Cammie Sickle, MD 06/19/2019 10:54 AM

## 2019-06-19 NOTE — Telephone Encounter (Signed)
Scheduled Zytiga and prednisone to be shipped 10/13 for delivery 10/14 from [facility].

## 2019-06-19 NOTE — Telephone Encounter (Signed)
Oral Oncology Pharmacist Encounter  Received new prescription for Zytiga (abiraterone) for the treatment of newly diagnosed metastatic castrate sensitive prostate cancer in conjunction with Lupron, planned duration until disease progression or unacceptable drug toxicity.  CMP from 06/19/2019 assessed, no relevant lab abnormalities. Prescription dose and frequency assessed.   Current medication list in Epic reviewed, one DDIs with abiraterone identified: - Tamsulosin: Abiraterone may increase the serum concentration of tamsulosin. Monitor for increased tamsulosin side effects (eg, hypotension, orthostasis). No baseline dose adjustment needed.   Prescription has been e-scribed to the [facility] for benefits analysis and approval.  Oral Oncology Clinic will continue to follow for insurance authorization, copayment issues, initial counseling and start date.  Darl Pikes, PharmD, BCPS, [facility] Hematology/Oncology Clinical Pharmacist [facility]/AP Oral [facility] [phone]  06/19/2019 11:08 AM

## 2019-06-20 MED FILL — ABIRATERONE ACETATE 250 MG: 250 | 30 days supply | Qty: 120 | Fill #0

## 2019-06-20 MED FILL — predniSONE 10 MG TABS: 10 | 30 days supply | Qty: 30 | Fill #0

## 2019-07-13 MED FILL — ABIRATERONE ACETATE 250 MG: 250 | 30 days supply | Qty: 120 | Fill #1

## 2019-07-13 MED FILL — predniSONE 10 MG TABS: 10 | 30 days supply | Qty: 30 | Fill #1

## 2019-07-17 ENCOUNTER — Inpatient Hospital Stay (HOSPITAL_BASED_OUTPATIENT_CLINIC_OR_DEPARTMENT_OTHER): Payer: Medicare Other | Admitting: Internal Medicine

## 2019-07-17 ENCOUNTER — Other Ambulatory Visit: Payer: Self-pay

## 2019-07-17 ENCOUNTER — Encounter: Payer: Self-pay | Admitting: Internal Medicine

## 2019-07-17 ENCOUNTER — Inpatient Hospital Stay: Payer: Medicare Other | Attending: Internal Medicine

## 2019-07-17 DIAGNOSIS — R232 Flushing: Secondary | ICD-10-CM | POA: Insufficient documentation

## 2019-07-17 DIAGNOSIS — N183 Chronic kidney disease, stage 3 unspecified: Secondary | ICD-10-CM | POA: Insufficient documentation

## 2019-07-17 DIAGNOSIS — Z87891 Personal history of nicotine dependence: Secondary | ICD-10-CM | POA: Insufficient documentation

## 2019-07-17 DIAGNOSIS — Z79899 Other long term (current) drug therapy: Secondary | ICD-10-CM | POA: Diagnosis not present

## 2019-07-17 DIAGNOSIS — C61 Malignant neoplasm of prostate: Secondary | ICD-10-CM

## 2019-07-17 DIAGNOSIS — Z7189 Other specified counseling: Secondary | ICD-10-CM

## 2019-07-17 DIAGNOSIS — Z833 Family history of diabetes mellitus: Secondary | ICD-10-CM | POA: Insufficient documentation

## 2019-07-17 DIAGNOSIS — C7951 Secondary malignant neoplasm of bone: Secondary | ICD-10-CM | POA: Diagnosis not present

## 2019-07-17 DIAGNOSIS — G20 Parkinson's disease: Secondary | ICD-10-CM | POA: Diagnosis not present

## 2019-07-17 DIAGNOSIS — Z8249 Family history of ischemic heart disease and other diseases of the circulatory system: Secondary | ICD-10-CM | POA: Insufficient documentation

## 2019-07-17 DIAGNOSIS — Z803 Family history of malignant neoplasm of breast: Secondary | ICD-10-CM | POA: Diagnosis not present

## 2019-07-17 DIAGNOSIS — Z5111 Encounter for antineoplastic chemotherapy: Secondary | ICD-10-CM | POA: Insufficient documentation

## 2019-07-17 LAB — COMPREHENSIVE METABOLIC PANEL
ALT: 6 U/L (ref 0–44)
AST: 32 U/L (ref 15–41)
Albumin: 4 g/dL (ref 3.5–5.0)
Alkaline Phosphatase: 55 U/L (ref 38–126)
Anion gap: 8 (ref 5–15)
BUN: 15 mg/dL (ref 8–23)
CO2: 27 mmol/L (ref 22–32)
Calcium: 9.5 mg/dL (ref 8.9–10.3)
Chloride: 105 mmol/L (ref 98–111)
Creatinine, Ser: 1.43 mg/dL — ABNORMAL HIGH (ref 0.61–1.24)
GFR calc Af Amer: 54 mL/min — ABNORMAL LOW (ref >60)
GFR calc non Af Amer: 47 mL/min — ABNORMAL LOW (ref >60)
Glucose, Bld: 106 mg/dL — ABNORMAL HIGH (ref 70–99)
Potassium: 3.6 mmol/L (ref 3.5–5.1)
Sodium: 140 mmol/L (ref 135–145)
Total Bilirubin: 1.6 mg/dL — ABNORMAL HIGH (ref 0.3–1.2)
Total Protein: 7 g/dL (ref 6.5–8.1)

## 2019-07-17 LAB — CBC WITH DIFFERENTIAL/PLATELET
Abs Immature Granulocytes: 0.05 10*3/uL (ref 0.00–0.07)
Basophils Absolute: 0.1 10*3/uL (ref 0.0–0.1)
Basophils Relative: 1 %
Eosinophils Absolute: 0.1 10*3/uL (ref 0.0–0.5)
Eosinophils Relative: 1 %
HCT: 44.2 % (ref 39.0–52.0)
Hemoglobin: 14.1 g/dL (ref 13.0–17.0)
Immature Granulocytes: 1 %
Lymphocytes Relative: 16 %
Lymphs Abs: 1.6 10*3/uL (ref 0.7–4.0)
MCH: 30.4 pg (ref 26.0–34.0)
MCHC: 31.9 g/dL (ref 30.0–36.0)
MCV: 95.3 fL (ref 80.0–100.0)
Monocytes Absolute: 0.7 10*3/uL (ref 0.1–1.0)
Monocytes Relative: 8 %
Neutro Abs: 7.3 10*3/uL (ref 1.7–7.7)
Neutrophils Relative %: 73 %
Platelets: 219 10*3/uL (ref 150–400)
RBC: 4.64 MIL/uL (ref 4.22–5.81)
RDW: 13.7 % (ref 11.5–15.5)
WBC: 9.9 10*3/uL (ref 4.0–10.5)
nRBC: 0 % (ref 0.0–0.2)

## 2019-07-17 LAB — PSA: Prostatic Specific Antigen: 4.91 ng/mL — ABNORMAL HIGH (ref 0.00–4.00)

## 2019-07-17 NOTE — Assessment & Plan Note (Addendum)
#   Castrate sensitive prostate cancer-metastatic to bone-left ischial tuberosity/ilium-oligometastatic.on Lupron [urology/ q36m;last 9/16] + Zytiga+prednsione.   # continue Zytiga+prednisone; Labs today reviewed;  acceptable for treatment today.  Again discussed the palliative nature of the treatments.  #Difficulty with urination-on flomax; stable.  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  Stable.  Monitor for now.  # Genetics counseling-patient's daughter with breast cancer in her 24s [survived]; discussed genetic counseling/testing.  Will refer to [facility].   # Flu shot [allergic/anaphylaxis]- HOLD off.   # DISPOSITION: # genetic counseling referral- Dx- Breast cancer # follow up in 4 weeks-MD- labs- cbc/cmp/PSA-Dr.B

## 2019-07-17 NOTE — Progress Notes (Signed)
[place] NOTE  Patient Care Team: Juline Patch, MD as PCP - General (Family Medicine) Tat, Eustace Quail, DO as Consulting Physician (Neurology)  CHIEF COMPLAINTS/PURPOSE OF CONSULTATION: PROSTATE CANCER  #  Oncology History Overview Note  # PROSTATE CANCER- METATSTATIC to BONE; PSA- 26.5. Sclerotic 1.5 cm left ischial lesion/ Sclerotic medial left iliac bone 1.5 cm lesion (series 2/image 47), increased from 1.0 cm. Gleason score of 4+5= 9; with almost all cores involved greater than 80%.  9/16 Lupron 63-month depot on 9/16. [Urology; Dr.Siniski]  # MID OCT 2020- Zytiga 1000 mg+ prednisone  # Parkinsons's syndrome [Dr.Tat; [place]; neurologist]; CKD-III [creat1.3-1.5]  DIAGNOSIS: Prostate cancer  STAGE:     4    ;  GOALS: Palliative/control  CURRENT/MOST RECENT THERAPY : Lupron      Prostate cancer metastatic to bone ([place])  05/24/2019 Initial Diagnosis   Prostate cancer metastatic to bone ([facility])      HISTORY OF PRESENTING ILLNESS:  [name] [name] 78 y.o.  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  Patient is currently on Lupron; and started on Zytiga approximately a month ago.  Patient's appetite is good.  No weight loss.  Denies any nausea vomiting diarrhea.  Denies any swelling in the legs.  Continues to have chronic mild difficulty with urination.  Not any worse.  Intermittent hot flashes.  Review of Systems  Constitutional: Negative for chills, diaphoresis, fever, malaise/fatigue and weight loss.  HENT: Negative for nosebleeds and sore throat.   Eyes: Negative for double vision.  Respiratory: Negative for cough, hemoptysis, sputum production, shortness of breath and wheezing.   Cardiovascular: Negative for chest pain, palpitations, orthopnea and leg swelling.  Gastrointestinal: Negative for abdominal pain, blood in stool, constipation, diarrhea, heartburn, melena, nausea and  vomiting.  Genitourinary: Positive for frequency and urgency. Negative for dysuria.  Musculoskeletal: Negative for back pain and joint pain.  Skin: Negative.  Negative for itching and rash.  Neurological: Positive for tremors. Negative for dizziness, tingling, focal weakness, weakness and headaches.  Endo/Heme/Allergies: Does not bruise/bleed easily.  Psychiatric/Behavioral: Negative for depression. The patient is not nervous/anxious and does not have insomnia.     MEDICAL HISTORY:  Past Medical History:  Diagnosis Date  . Parkinson's disease ([place])   . REM sleep behavior disorder   . Sleep apnea     SURGICAL HISTORY: Past Surgical History:  Procedure Laterality Date  . APPENDECTOMY    . 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 Marital status: Married    Spouse name: Not on file  . Number of children: Not on file  . Years of education: Not on file  . Highest education level: Not on file  Occupational History  . Occupation: retired    Comment: Nutritional therapist  Social Needs  . Financial resource strain: Not on file  . Food insecurity    Worry: Not on file    Inability: Not on file  . Transportation needs    Medical: Not on file    Non-medical: Not on file  Tobacco Use  . Smoking status: Former Smoker    Types: Cigarettes, Cigars    Quit date: 09/07/2010    Years since quitting: 8.8  . Smokeless tobacco: Never Used  Substance and Sexual Activity  . Alcohol use: Yes    Comment: 1 can beer/day  . Drug use: Never  . Sexual  activity: Not Currently  Lifestyle  . Physical activity    Days per week: Not on file    Minutes per session: Not on file  . Stress: Not on file  Relationships  . Social Herbalist on phone: Not on file    Gets together: Not on file    Attends religious service: Not on file    Active member of club or organization: Not on file    Attends meetings of clubs or organizations: Not on  file    Relationship status: Not on file  . Intimate partner violence    Fear of current or ex partner: Not on file    Emotionally abused: Not on file    Physically abused: Not on file    Forced sexual activity: Not on file  Other Topics Concern  . 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     FAMILY HISTORY: Family History  Problem Relation Age of Onset  . Heart disease Maternal Grandfather   . Diabetes Paternal Grandfather   . Breast cancer Daughter     ALLERGIES:  is allergic to influenza vaccines.  MEDICATIONS:  Current Outpatient Medications  Medication Sig Dispense Refill  . abiraterone acetate (ZYTIGA) 250 MG tablet Take 4 tablets (1,000 mg total) by mouth daily. Take on an empty stomach 1 hour before or 2 hours after a meal 120 tablet 4  . carbidopa-levodopa (SINEMET IR) 25-100 MG tablet Take 2 tablets by mouth 3 (three) times daily. TAKE 2 tablets at 7AM, 1 tablet at 11AM, 2 tablets at 3PM, 1 tablet at 7PM    . Glycerin-Hypromellose-PEG 400 (VISINE DRY EYE OP) Apply to eye. otc    . ibuprofen (ADVIL,MOTRIN) 600 MG tablet Take by mouth as needed. otc    . ketoconazole (NIZORAL) 2 % shampoo     . predniSONE (DELTASONE) 10 MG tablet Take 1 tablet (10 mg total) by mouth daily with breakfast. 30 tablet 6  . tamsulosin (FLOMAX) 0.4 MG CAPS capsule Take 1 capsule (0.4 mg total) by mouth daily. 90 capsule 3  . clonazePAM (KLONOPIN) 0.5 MG tablet Take 1 tablet by mouth at bedtime. Dr Anna Genre     No current facility-administered medications for this visit.       Marland Kitchen  PHYSICAL EXAMINATION: ECOG PERFORMANCE STATUS: 0 - Asymptomatic  Vitals:   07/17/19 0959  BP: 103/72  Pulse: (!) 48  Temp: 97.8 F (36.6 C)   Filed Weights   07/17/19 0959  Weight: 158 lb 8 oz (71.9 kg)    Physical Exam  Constitutional: He is oriented to person, place, and time and  well-developed, well-nourished, and in no distress.  HENT:  Head: Normocephalic and atraumatic.  Mouth/Throat: Oropharynx is clear and moist. No oropharyngeal exudate.  Eyes: Pupils are equal, round, and reactive to light.  Neck: Normal range of motion. Neck supple.  Cardiovascular: Normal rate and regular rhythm.  Pulmonary/Chest: No respiratory distress. He has no wheezes.  Abdominal: Soft. Bowel sounds are normal. He exhibits no distension and no mass. There is no abdominal tenderness. There is no rebound and no guarding.  Musculoskeletal: Normal range of motion.        General: No tenderness or edema.  Neurological: He is alert and oriented to person, place, and time.  Tremor of right upper extremity.  Skin: Skin is warm.  Psychiatric: Affect normal.  LABORATORY DATA:  I have reviewed the data as listed Lab Results  Component Value Date   WBC 9.9 07/17/2019   HGB 14.1 07/17/2019   HCT 44.2 07/17/2019   MCV 95.3 07/17/2019   PLT 219 07/17/2019   Recent Labs    01/07/19 1541 05/16/19 1036 06/19/19 0925 07/17/19 0948  NA 135  --  139 140  K 3.8  --  4.1 3.6  CL 100  --  106 105  CO2 26  --  25 27  GLUCOSE 102*  --  111* 106*  BUN 13  --  20 15  CREATININE 1.29* 1.50* 1.42* 1.43*  CALCIUM 9.1  --  9.5 9.5  GFRNONAA 53*  --  47* 47*  GFRAA >60  --  55* 54*  PROT 7.5  --  7.4 7.0  ALBUMIN 4.1  --  4.1 4.0  AST 22  --  29 32  ALT <5  --  <5 6  ALKPHOS 68  --  51 55  BILITOT 2.6*  --  1.9* 1.6*    RADIOGRAPHIC STUDIES: I have personally reviewed the radiological images as listed and agreed with the findings in the report. No results found.  ASSESSMENT & PLAN:   Prostate cancer metastatic to bone ([place]) # Castrate sensitive prostate cancer-metastatic to bone-left ischial tuberosity/ilium-oligometastatic.on Lupron [urology/ q52m;last 9/16] + Zytiga+prednsione.   # continue Zytiga+prednisone; Labs today reviewed;  acceptable for treatment today.  Again discussed  the palliative nature of the treatments.  #Difficulty with urination-on flomax; stable.  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  Stable.  Monitor for now.  # Genetics counseling-patient's daughter with breast cancer in her 21s [survived]; discussed genetic counseling/testing.  Will refer to [facility].   # Flu shot [allergic/anaphylaxis]- HOLD off.   # DISPOSITION: # genetic counseling referral- Dx- Breast cancer # follow up in 4 weeks-MD- labs- cbc/cmp/PSA-Dr.B   All questions were answered. The patient knows to call the clinic with any problems, questions or concerns.    Cammie Sickle, MD 07/17/2019 10:27 AM

## 2019-07-18 ENCOUNTER — Ambulatory Visit: Payer: Medicare Other | Admitting: Neurology

## 2019-07-25 NOTE — Progress Notes (Signed)
[name] [name] was seen today in follow up for Parkinsons disease.  His levodopa was increased last visit.  He reports that he is having no SE with the medication.  He did go to physical and occupational therapy since our last visit.  He is having a bit more problem with R hand dexterity and coordination. Those notes have been reviewed.  He also has been seen by [facility] neurology for his sleep apnea/BiPAP.  I have reviewed those notes as well.  Pt denies falls.  Pt denies lightheadedness, near syncope.  No hallucinations.  Mood has been good.  Patient has been seen by urology on July 13 and September 16.  Unfortunately, he was diagnosed with metastatic prostate cancer.  He does have bony mets.  He has no pain from that.  caregiving for wife who fell and broke her patella and that has been stressful.  Current prescribed movement disorder medications: Carbidopa/levodopa 25/100, 2 tablets at 7 AM/1 tablet at 11 AM/2 tablets at 3 PM/1 tablet at 7 PM (increased last visit) Clonazepam 0.5 mg, 1 tablet at bedtime   PREVIOUS MEDICATIONS: azilect, carbidopa/levodopa 25/100; klonopin  ALLERGIES:   Allergies  Allergen Reactions   Influenza Vaccines Anaphylaxis    Flu shot: 1974 anaphylaxis. 2nd time swollen arm Flu shot: 1974 anaphylaxis. 2nd time swollen arm     CURRENT MEDICATIONS:  Outpatient Encounter Medications as of 07/27/2019  Medication Sig   abiraterone acetate (ZYTIGA) 250 MG tablet Take 4 tablets (1,000 mg total) by mouth daily. Take on an empty stomach 1 hour before or 2 hours after a meal   carbidopa-levodopa (SINEMET IR) 25-100 MG tablet Take 2 tablets by mouth 3 (three) times daily. TAKE 2 tablets at 7AM, 2 tablet at 11AM, 2 tablets at 3PM, 1 tablet at 7PM   clonazePAM (KLONOPIN) 0.5 MG tablet Take 1 tablet by mouth at bedtime. Dr Anna Genre   Glycerin-Hypromellose-PEG 400 (VISINE DRY EYE OP) Apply to eye. otc   ibuprofen (ADVIL,MOTRIN) 600 MG tablet Take by mouth as needed. otc     ketoconazole (NIZORAL) 2 % shampoo    predniSONE (DELTASONE) 10 MG tablet Take 1 tablet (10 mg total) by mouth daily with breakfast.   tamsulosin (FLOMAX) 0.4 MG CAPS capsule Take 1 capsule (0.4 mg total) by mouth daily.   No facility-administered encounter medications on file as of 07/27/2019.     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    SOCIAL HISTORY:   Social History   Socioeconomic History   Marital status: Married    Spouse name: Not on file   Number of children: Not on file   Years of education: Not on file   Highest education level: Not on file  Occupational History   Occupation: retired    Comment: Technical sales engineer strain: Not on file   Food insecurity    Worry: Not on file    Inability: Not on file   Transportation needs    Medical: Not on file    Non-medical: Not on file  Tobacco Use   Smoking status: Former Smoker    Types: Cigarettes, Cigars    Quit date: 09/07/2010  Years since quitting: 8.8   Smokeless tobacco: Never Used  Substance and Sexual Activity   Alcohol use: Yes    Comment: 1 can beer/day   Drug use: Never   Sexual activity: Not Currently  Lifestyle   Physical activity    Days per week: Not on file    Minutes per session: Not on file   Stress: Not on file  Relationships   Social connections    Talks on phone: Not on file    Gets together: Not on file    Attends religious service: Not on file    Active member of club or organization: Not on file    Attends meetings of clubs or organizations: Not on file    Relationship status: Not on file   Intimate partner violence    Fear of current or ex partner: Not on file    Emotionally abused: Not on  file    Physically abused: Not on file    Forced sexual activity: Not on file  Other Topics Concern   Not on file  Social History Narrative   Lives in [place]; quit smoking in 2012; ocassional alcohol; Camera operator; wife; 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     FAMILY HISTORY:   Family Status  Relation Name Status   MGF  (Not Specified)   PGF  (Not Specified)   Mother  Deceased   Father  Deceased   Brother  Alive   Daughter  Alive   Son  Alive    ROS:  Review of Systems  Constitutional: Negative.   HENT: Negative.   Eyes: Negative.   Respiratory: Negative.   Cardiovascular: Negative.   Gastrointestinal: Negative.   Genitourinary: Negative.   Musculoskeletal: Negative.   Skin: Negative.     PHYSICAL EXAMINATION:    VITALS:   Vitals:   07/27/19 1043  BP: 102/61  Pulse: (!) 52  SpO2: 96%  Weight: 158 lb (71.7 kg)  Height: 5' 9"  (1.753 m)    GEN:  The patient appears stated age and is in NAD. HEENT:  Normocephalic, atraumatic.  The mucous membranes are moist. The superficial temporal arteries are without ropiness or tenderness. CV:  Loletha Grayer.  regular Lungs:  CTAB Neck/HEME:  There are no carotid bruits bilaterally.  Neurological examination:  Orientation: The patient is alert and oriented x3. Cranial nerves: There is good facial symmetry with mild facial hypomimia. The speech is fluent and clear. Soft palate rises symmetrically and there is no tongue deviation. Hearing is intact to conversational tone. Sensation: Sensation is intact to light touch throughout Motor: Strength is at least antigravity x4.  Movement examination: Tone: There is mild increased tone in the RUE Abnormal movements: none seen but mild felt in the RUE Coordination:  There is mild decremation with RAM's, with any form of RAMS, including alternating supination and pronation of the forearm, hand opening and closing, finger taps, heel taps and toe taps, on the R, UE more than LE Gait and  Station: The patient has no difficulty arising out of a deep-seated chair without the use of the hands. The patient's stride length is good.     ASSESSMENT/PLAN:  1.   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  The patient has tremor, bradykinesia, rigidity and mild postural instability.             -We decided to increase carbidopa/levodopa 25/100, to 2 tablets at 7 AM/2 tablet at 11 AM/2 tablets at 3 PM/1 tablet at 7 PM.             -  We discussed community resources in the area including patient support groups and community exercise programs for PD and pt education was provided to the patient.  -met with social worker today  -discussed online RSB as he doesn't feel comfortable with in person any longer  2.  REM behavior disorder             -This is commonly associated with PD and the patient is experiencing this.  We discussed that this can be very serious and even harmful.  We talked about medications as well as physical barriers to put in the bed (particularly soft bed rails, pillow barriers).  We talked about moving the night stand so that it is not so close to the side of the bed.  Patient has been on clonazepam for quite some time and is doing well with this.  He will continue the clonazepam, 0.5 mg at night.  3.  Obstructive sleep apnea syndrome.             -We discussed morbidity and mortality associated with untreated sleep apnea.  The patient expressed understanding.  He is on bipap.  -following with [facility] neuro for this  4.    Metastatic prostate cancer with bony mets  -Following with oncology.  5.  Follow up is anticipated in the next 4-6 months, sooner should new neurologic issues arise.  Much greater than 50% of this visit was spent in counseling and coordinating care.  Total face to face time:  25 min  Cc:  Juline Patch, MD

## 2019-07-27 ENCOUNTER — Ambulatory Visit (INDEPENDENT_AMBULATORY_CARE_PROVIDER_SITE_OTHER): Payer: Medicare Other | Admitting: Neurology

## 2019-07-27 ENCOUNTER — Other Ambulatory Visit: Payer: Self-pay

## 2019-07-27 ENCOUNTER — Encounter: Payer: Self-pay | Admitting: Neurology

## 2019-07-27 ENCOUNTER — Ambulatory Visit (INDEPENDENT_AMBULATORY_CARE_PROVIDER_SITE_OTHER): Payer: Medicare Other | Admitting: Clinical

## 2019-07-27 VITALS — BP 102/61 | HR 52 | Ht 69.0 in | Wt 158.0 lb

## 2019-07-27 DIAGNOSIS — C7951 Secondary malignant neoplasm of bone: Secondary | ICD-10-CM

## 2019-07-27 DIAGNOSIS — G20 Parkinson's disease: Secondary | ICD-10-CM | POA: Diagnosis not present

## 2019-07-27 DIAGNOSIS — Z719 Counseling, unspecified: Secondary | ICD-10-CM

## 2019-07-27 DIAGNOSIS — C61 Malignant neoplasm of prostate: Secondary | ICD-10-CM | POA: Diagnosis not present

## 2019-07-27 NOTE — Patient Instructions (Addendum)
increase carbidopa/levodopa 25/100, to 2 tablets at 7 AM/2 tablet at 11 AM/2 tablets at 3 PM/1 tablet at 7 PM.  The physicians and staff at [facility] are committed to providing excellent care. You may receive a survey requesting feedback about your experience at our office. We strive to receive "very good" responses to the survey questions. If you feel that your experience would prevent you from giving the office a "very good " response, please contact our office to try to remedy the situation. We may be reached at [phone]. Thank you for taking the time out of your busy day to complete the survey.

## 2019-08-01 NOTE — BH Specialist Note (Signed)
Referring Provider: Alonza Bogus, DO Date of Referral: 07/27/19 Primary Reason for Referral: Pt Education Location of Service: Individual, office visit  Suicide/Homicide Risk: Pt denies risk  Pt presentswith h/o Parkinson's Disease. Pt presents today for patient education re management of chronic sx related to PD. LCSW provided patienteducation re non-motor sx including cognitive and moodcomponents of the disease as well as thevalue of forced intense exercise to manage motor sx (in addition to medication regimen).Pt responded receptively to information. Pt also provided with information for PD educational support group, andexercise info for Parkinson's [employer], as well as plan for more options for virtual classes in future. Ptattended RSB online in past and is interested in online exercise opportunities.LCSW will remain available for future consultation.   1. Patient to follow up with LCSW: as needed 2. Medication Recommendation:none 3. Behavioral Recommendation(s): A:Attend RSB online orientation 12/3 B Consider goal of 3-4 hrs forced intense exercise/week to maximize benefit of slowing PD progression

## 2019-08-14 ENCOUNTER — Encounter: Payer: Self-pay | Admitting: Internal Medicine

## 2019-08-14 ENCOUNTER — Inpatient Hospital Stay: Payer: Medicare Other | Attending: Internal Medicine

## 2019-08-14 ENCOUNTER — Inpatient Hospital Stay (HOSPITAL_BASED_OUTPATIENT_CLINIC_OR_DEPARTMENT_OTHER): Payer: Medicare Other | Admitting: Internal Medicine

## 2019-08-14 ENCOUNTER — Other Ambulatory Visit: Payer: Self-pay

## 2019-08-14 DIAGNOSIS — N183 Chronic kidney disease, stage 3 unspecified: Secondary | ICD-10-CM | POA: Diagnosis not present

## 2019-08-14 DIAGNOSIS — I4892 Unspecified atrial flutter: Secondary | ICD-10-CM | POA: Diagnosis not present

## 2019-08-14 DIAGNOSIS — Z20828 Contact with and (suspected) exposure to other viral communicable diseases: Secondary | ICD-10-CM | POA: Diagnosis not present

## 2019-08-14 DIAGNOSIS — C61 Malignant neoplasm of prostate: Secondary | ICD-10-CM

## 2019-08-14 DIAGNOSIS — Z79899 Other long term (current) drug therapy: Secondary | ICD-10-CM | POA: Diagnosis not present

## 2019-08-14 DIAGNOSIS — C7951 Secondary malignant neoplasm of bone: Secondary | ICD-10-CM

## 2019-08-14 DIAGNOSIS — Z03818 Encounter for observation for suspected exposure to other biological agents ruled out: Secondary | ICD-10-CM | POA: Diagnosis not present

## 2019-08-14 LAB — COMPREHENSIVE METABOLIC PANEL
ALT: 8 U/L (ref 0–44)
AST: 33 U/L (ref 15–41)
Albumin: 3.9 g/dL (ref 3.5–5.0)
Alkaline Phosphatase: 58 U/L (ref 38–126)
Anion gap: 8 (ref 5–15)
BUN: 19 mg/dL (ref 8–23)
CO2: 23 mmol/L (ref 22–32)
Calcium: 9.7 mg/dL (ref 8.9–10.3)
Chloride: 109 mmol/L (ref 98–111)
Creatinine, Ser: 1.42 mg/dL — ABNORMAL HIGH (ref 0.61–1.24)
GFR calc Af Amer: 55 mL/min — ABNORMAL LOW (ref >60)
GFR calc non Af Amer: 47 mL/min — ABNORMAL LOW (ref >60)
Glucose, Bld: 122 mg/dL — ABNORMAL HIGH (ref 70–99)
Potassium: 3.7 mmol/L (ref 3.5–5.1)
Sodium: 140 mmol/L (ref 135–145)
Total Bilirubin: 1.8 mg/dL — ABNORMAL HIGH (ref 0.3–1.2)
Total Protein: 6.8 g/dL (ref 6.5–8.1)

## 2019-08-14 LAB — CBC WITH DIFFERENTIAL/PLATELET
Abs Immature Granulocytes: 0.08 10*3/uL — ABNORMAL HIGH (ref 0.00–0.07)
Basophils Absolute: 0.1 10*3/uL (ref 0.0–0.1)
Basophils Relative: 1 %
Eosinophils Absolute: 0.1 10*3/uL (ref 0.0–0.5)
Eosinophils Relative: 1 %
HCT: 45.2 % (ref 39.0–52.0)
Hemoglobin: 14.5 g/dL (ref 13.0–17.0)
Immature Granulocytes: 1 %
Lymphocytes Relative: 16 %
Lymphs Abs: 1.6 10*3/uL (ref 0.7–4.0)
MCH: 30.7 pg (ref 26.0–34.0)
MCHC: 32.1 g/dL (ref 30.0–36.0)
MCV: 95.8 fL (ref 80.0–100.0)
Monocytes Absolute: 0.6 10*3/uL (ref 0.1–1.0)
Monocytes Relative: 7 %
Neutro Abs: 7.3 10*3/uL (ref 1.7–7.7)
Neutrophils Relative %: 74 %
Platelets: 236 10*3/uL (ref 150–400)
RBC: 4.72 MIL/uL (ref 4.22–5.81)
RDW: 13.9 % (ref 11.5–15.5)
WBC: 9.7 10*3/uL (ref 4.0–10.5)
nRBC: 0 % (ref 0.0–0.2)

## 2019-08-14 LAB — PSA: Prostatic Specific Antigen: 2.4 ng/mL (ref 0.00–4.00)

## 2019-08-14 MED FILL — ABIRATERONE ACETATE 250 MG: 250 | 30 days supply | Qty: 120 | Fill #2

## 2019-08-14 MED FILL — predniSONE 10 MG TABS: 10 | 30 days supply | Qty: 30 | Fill #2

## 2019-08-14 NOTE — Assessment & Plan Note (Addendum)
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 PSA- 4.9; trending down; STABLE.   # continue Zytiga+prednisone; Labs today reviewed;  acceptable for treatment today.  #Difficulty with urination-on flomax; stable.  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  STABLE;  Monitor for now.  # HOt flashes-grade 1-2 sec to Anti-androgen therapies-monitor for now.  # Genetics counseling-patient's daughter with breast cancer in her 64s [survived]; discussed genetic counseling/testing.  Will refer to [facility]. Again discussed today.   # ? Panic attack- wife's care giver [broken knee]- defer to PCP.   # DISPOSITION: # genetic counseling referral- Dx- prostate cancer # follow up in 4 weeks-MD-VIDEO; Labs-1-2 day prior- cbc/cmp/PSA;-Dr.B

## 2019-08-14 NOTE — Progress Notes (Signed)
[facility] NOTE  Patient Care Team: Juline Patch, MD as PCP - General (Family Medicine) Tat, Eustace Quail, DO as Consulting Physician (Neurology)  CHIEF COMPLAINTS/PURPOSE OF CONSULTATION: PROSTATE CANCER  #  Oncology History Overview Note  # PROSTATE CANCER- METATSTATIC to BONE; PSA- 26.5. Sclerotic 1.5 cm left ischial lesion/ Sclerotic medial left iliac bone 1.5 cm lesion (series 2/image 47), increased from 1.0 cm. Gleason score of 4+5= 9; with almost all cores involved greater than 80%.  9/16 Lupron 29-month depot on 9/16. [Urology; Dr.Siniski]  # MID OCT 2020- Zytiga 1000 mg+ prednisone  # Parkinsons's syndrome [Dr.Tat; [place]; neurologist]; CKD-III [creat1.3-1.5]  DIAGNOSIS: Prostate cancer  STAGE:     4    ;  GOALS: Palliative/control  CURRENT/MOST RECENT THERAPY : Lupron      Prostate cancer metastatic to bone ([place])  05/24/2019 Initial Diagnosis   Prostate cancer metastatic to bone ([facility])    HISTORY OF PRESENTING ILLNESS:  [name] [name] 78 y.o.  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  Patient is currently on Lupron; and started on Zytiga approximately 2 months ago.  Patient's appetite is good but denies any bone pain pain no nausea no vomiting no diarrhea. No swelling in the legs.  He does complain of intermittent hot flashes.  Patient noted to have episode of chest tightness/palpitations and sweating that lasted for few minutes. It has resolved since then. Not associated with any exertion or chest pain. Patient has history of panic attacks. He is currently taking care of his wife who has a broken knee. States he is under a lot of stress at home  Review of Systems  Constitutional: Negative for chills, diaphoresis, fever, malaise/fatigue and weight loss.  HENT: Negative for nosebleeds and sore throat.   Eyes: Negative for double vision.  Respiratory: Negative for cough,  hemoptysis, sputum production, shortness of breath and wheezing.   Cardiovascular: Negative for chest pain, palpitations, orthopnea and leg swelling.  Gastrointestinal: Negative for abdominal pain, blood in stool, constipation, diarrhea, heartburn, melena, nausea and vomiting.  Genitourinary: Positive for frequency and urgency. Negative for dysuria.  Musculoskeletal: Negative for back pain and joint pain.  Skin: Negative.  Negative for itching and rash.  Neurological: Positive for tremors. Negative for dizziness, tingling, focal weakness, weakness and headaches.  Endo/Heme/Allergies: Does not bruise/bleed easily.  Psychiatric/Behavioral: Negative for depression. The patient is not nervous/anxious and does not have insomnia.     MEDICAL HISTORY:  Past Medical History:  Diagnosis Date  . Parkinson's disease ([place])   . REM sleep behavior disorder   . Sleep apnea     SURGICAL HISTORY: Past Surgical History:  Procedure Laterality Date  . APPENDECTOMY    . 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 Marital status: Married    Spouse name: Not on file  . Number of children: Not on file  . Years of education: Not on file  . Highest education level: Not on file  Occupational History  . Occupation: retired    Comment: Nutritional therapist  Social Needs  . Financial resource strain: Not on file  . Food insecurity    Worry: Not on file    Inability: Not on file  . Transportation needs    Medical: Not on file    Non-medical: Not on file  Tobacco Use  . Smoking status: Former Smoker  Types: Cigarettes, Cigars    Quit date: 09/07/2010    Years since quitting: 8.9  . Smokeless tobacco: Never Used  Substance and Sexual Activity  . Alcohol use: Yes    Comment: 1 can beer/day  . Drug use: Never  . Sexual activity: Not Currently  Lifestyle  . Physical activity    Days per week: Not on file    Minutes per session: Not on file  . Stress:  Not on file  Relationships  . Social Herbalist on phone: Not on file    Gets together: Not on file    Attends religious service: Not on file    Active member of club or organization: Not on file    Attends meetings of clubs or organizations: Not on file    Relationship status: Not on file  . Intimate partner violence    Fear of current or ex partner: Not on file    Emotionally abused: Not on file    Physically abused: Not on file    Forced sexual activity: Not on file  Other Topics Concern  . 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     FAMILY HISTORY: Family History  Problem Relation Age of Onset  . Heart disease Maternal Grandfather   . Diabetes Paternal Grandfather   . Breast cancer Daughter     ALLERGIES:  is allergic to influenza vaccines.  MEDICATIONS:  Current Outpatient Medications  Medication Sig Dispense Refill  . abiraterone acetate (ZYTIGA) 250 MG tablet Take 4 tablets (1,000 mg total) by mouth daily. Take on an empty stomach 1 hour before or 2 hours after a meal 120 tablet 4  . carbidopa-levodopa (SINEMET IR) 25-100 MG tablet Take 2 tablets by mouth 3 (three) times daily. TAKE 2 tablets at 7AM, 2 tablet at 11AM, 2 tablets at 3PM, 1 tablet at 7PM    . Glycerin-Hypromellose-PEG 400 (VISINE DRY EYE OP) Apply to eye. otc    . ibuprofen (ADVIL,MOTRIN) 600 MG tablet Take by mouth as needed. otc    . ketoconazole (NIZORAL) 2 % shampoo     . predniSONE (DELTASONE) 10 MG tablet Take 1 tablet (10 mg total) by mouth daily with breakfast. 30 tablet 6  . tamsulosin (FLOMAX) 0.4 MG CAPS capsule Take 1 capsule (0.4 mg total) by mouth daily. 90 capsule 3  . clonazePAM (KLONOPIN) 0.5 MG tablet Take 1 tablet by mouth at bedtime. Dr Anna Genre     No current facility-administered medications for this visit.       Marland Kitchen  PHYSICAL EXAMINATION: ECOG PERFORMANCE  STATUS: 0 - Asymptomatic  Vitals:   08/14/19 1018  BP: 108/67  Pulse: (!) 59  Resp: 18  Temp: 97.6 F (36.4 C)   Filed Weights   08/14/19 1018  Weight: 158 lb (71.7 kg)    Physical Exam  Constitutional: He is oriented to person, place, and time and well-developed, well-nourished, and in no distress.  HENT:  Head: Normocephalic and atraumatic.  Mouth/Throat: Oropharynx is clear and moist. No oropharyngeal exudate.  Eyes: Pupils are equal, round, and reactive to light.  Neck: Normal range of motion. Neck supple.  Cardiovascular: Normal rate and regular rhythm.  Pulmonary/Chest: No respiratory distress. He has no wheezes.  Abdominal: Soft. Bowel sounds are normal. He exhibits no distension and no mass. There is no abdominal tenderness. There is no rebound and no  guarding.  Musculoskeletal: Normal range of motion.        General: No tenderness or edema.  Neurological: He is alert and oriented to person, place, and time.  Tremor of right upper extremity.  Skin: Skin is warm.  Psychiatric: Affect normal.     LABORATORY DATA:  I have reviewed the data as listed Lab Results  Component Value Date   WBC 9.7 08/14/2019   HGB 14.5 08/14/2019   HCT 45.2 08/14/2019   MCV 95.8 08/14/2019   PLT 236 08/14/2019   Recent Labs    06/19/19 0925 07/17/19 0948 08/14/19 0953  NA 139 140 140  K 4.1 3.6 3.7  CL 106 105 109  CO2 25 27 23   GLUCOSE 111* 106* 122*  BUN 20 15 19   CREATININE 1.42* 1.43* 1.42*  CALCIUM 9.5 9.5 9.7  GFRNONAA 47* 47* 47*  GFRAA 55* 54* 55*  PROT 7.4 7.0 6.8  ALBUMIN 4.1 4.0 3.9  AST 29 32 33  ALT 5 6 8   ALKPHOS 51 55 58  BILITOT 1.9* 1.6* 1.8*    RADIOGRAPHIC STUDIES: I have personally reviewed the radiological images as listed and agreed with the findings in the report. No results found.  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 PSA- 4.9; trending down; STABLE.   # continue Zytiga+prednisone; Labs today reviewed;  acceptable for treatment today.  #Difficulty with urination-on flomax; stable.  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  STABLE;  Monitor for now.  # HOt flashes-grade 1-2 sec to Anti-androgen therapies-monitor for now.  # Genetics counseling-patient's daughter with breast cancer in her 24s [survived]; discussed genetic counseling/testing.  Will refer to [facility]. Again discussed today.   # ? Panic attack- wife's care giver [broken knee]- defer to PCP.   # DISPOSITION: # genetic counseling referral- Dx- prostate cancer # follow up in 4 weeks-MD-VIDEO; Labs-1-2 day prior- cbc/cmp/PSA;-Dr.B   All questions were answered. The patient knows to call the clinic with any problems, questions or concerns.    Cammie Sickle, MD 08/14/2019 11:46 AM

## 2019-08-16 ENCOUNTER — Encounter: Payer: Self-pay | Admitting: Emergency Medicine

## 2019-08-16 ENCOUNTER — Encounter: Payer: Self-pay | Admitting: Family Medicine

## 2019-08-16 ENCOUNTER — Ambulatory Visit (INDEPENDENT_AMBULATORY_CARE_PROVIDER_SITE_OTHER): Payer: Medicare Other | Admitting: Family Medicine

## 2019-08-16 ENCOUNTER — Other Ambulatory Visit: Payer: Self-pay

## 2019-08-16 ENCOUNTER — Emergency Department: Payer: Medicare Other

## 2019-08-16 ENCOUNTER — Inpatient Hospital Stay
Admission: EM | Admit: 2019-08-16 | Discharge: 2019-08-18 | DRG: 309 | Disposition: A | Discharge status: Home / Self Care | Payer: Medicare Other | Source: Ambulatory Visit | Attending: Internal Medicine | Admitting: Internal Medicine

## 2019-08-16 VITALS — BP 108/91 | HR 96 | Ht 69.0 in | Wt 158.0 lb

## 2019-08-16 DIAGNOSIS — Z803 Family history of malignant neoplasm of breast: Secondary | ICD-10-CM | POA: Diagnosis not present

## 2019-08-16 DIAGNOSIS — F419 Anxiety disorder, unspecified: Secondary | ICD-10-CM | POA: Diagnosis present

## 2019-08-16 DIAGNOSIS — G20 Parkinson's disease: Secondary | ICD-10-CM | POA: Diagnosis present

## 2019-08-16 DIAGNOSIS — R778 Other specified abnormalities of plasma proteins: Secondary | ICD-10-CM | POA: Diagnosis present

## 2019-08-16 DIAGNOSIS — G4730 Sleep apnea, unspecified: Secondary | ICD-10-CM | POA: Diagnosis present

## 2019-08-16 DIAGNOSIS — R000 Tachycardia, unspecified: Secondary | ICD-10-CM

## 2019-08-16 DIAGNOSIS — I4891 Unspecified atrial fibrillation: Secondary | ICD-10-CM | POA: Diagnosis not present

## 2019-08-16 DIAGNOSIS — G4733 Obstructive sleep apnea (adult) (pediatric): Secondary | ICD-10-CM | POA: Diagnosis not present

## 2019-08-16 DIAGNOSIS — G20A1 Parkinson's disease without dyskinesia, without mention of fluctuations: Secondary | ICD-10-CM | POA: Diagnosis present

## 2019-08-16 DIAGNOSIS — R0789 Other chest pain: Secondary | ICD-10-CM

## 2019-08-16 DIAGNOSIS — C61 Malignant neoplasm of prostate: Secondary | ICD-10-CM | POA: Diagnosis present

## 2019-08-16 DIAGNOSIS — Z03818 Encounter for observation for suspected exposure to other biological agents ruled out: Secondary | ICD-10-CM | POA: Diagnosis not present

## 2019-08-16 DIAGNOSIS — Z20828 Contact with and (suspected) exposure to other viral communicable diseases: Secondary | ICD-10-CM | POA: Diagnosis present

## 2019-08-16 DIAGNOSIS — Z87891 Personal history of nicotine dependence: Secondary | ICD-10-CM | POA: Diagnosis not present

## 2019-08-16 DIAGNOSIS — I4892 Unspecified atrial flutter: Secondary | ICD-10-CM | POA: Diagnosis present

## 2019-08-16 DIAGNOSIS — Z887 Allergy status to serum and vaccine status: Secondary | ICD-10-CM | POA: Diagnosis not present

## 2019-08-16 DIAGNOSIS — Z79899 Other long term (current) drug therapy: Secondary | ICD-10-CM | POA: Diagnosis not present

## 2019-08-16 DIAGNOSIS — C7951 Secondary malignant neoplasm of bone: Secondary | ICD-10-CM | POA: Diagnosis present

## 2019-08-16 DIAGNOSIS — Z833 Family history of diabetes mellitus: Secondary | ICD-10-CM

## 2019-08-16 DIAGNOSIS — Z7952 Long term (current) use of systemic steroids: Secondary | ICD-10-CM

## 2019-08-16 DIAGNOSIS — Z9989 Dependence on other enabling machines and devices: Secondary | ICD-10-CM

## 2019-08-16 DIAGNOSIS — R002 Palpitations: Secondary | ICD-10-CM

## 2019-08-16 DIAGNOSIS — N183 Chronic kidney disease, stage 3 unspecified: Secondary | ICD-10-CM | POA: Diagnosis present

## 2019-08-16 DIAGNOSIS — Z524 Kidney donor: Secondary | ICD-10-CM

## 2019-08-16 DIAGNOSIS — Z905 Acquired absence of kidney: Secondary | ICD-10-CM

## 2019-08-16 DIAGNOSIS — I471 Supraventricular tachycardia: Secondary | ICD-10-CM | POA: Diagnosis not present

## 2019-08-16 DIAGNOSIS — Z791 Long term (current) use of non-steroidal anti-inflammatories (NSAID): Secondary | ICD-10-CM | POA: Diagnosis not present

## 2019-08-16 DIAGNOSIS — Z8249 Family history of ischemic heart disease and other diseases of the circulatory system: Secondary | ICD-10-CM

## 2019-08-16 LAB — CBC WITH DIFFERENTIAL/PLATELET
Abs Immature Granulocytes: 0.05 10*3/uL (ref 0.00–0.07)
Basophils Absolute: 0 10*3/uL (ref 0.0–0.1)
Basophils Relative: 0 %
Eosinophils Absolute: 0 10*3/uL (ref 0.0–0.5)
Eosinophils Relative: 0 %
HCT: 48.4 % (ref 39.0–52.0)
Hemoglobin: 16.2 g/dL (ref 13.0–17.0)
Immature Granulocytes: 1 %
Lymphocytes Relative: 12 %
Lymphs Abs: 1 10*3/uL (ref 0.7–4.0)
MCH: 30.8 pg (ref 26.0–34.0)
MCHC: 33.5 g/dL (ref 30.0–36.0)
MCV: 92 fL (ref 80.0–100.0)
Monocytes Absolute: 0.4 10*3/uL (ref 0.1–1.0)
Monocytes Relative: 4 %
Neutro Abs: 7.5 10*3/uL (ref 1.7–7.7)
Neutrophils Relative %: 83 %
Platelets: 265 10*3/uL (ref 150–400)
RBC: 5.26 MIL/uL (ref 4.22–5.81)
RDW: 14.1 % (ref 11.5–15.5)
WBC: 9 10*3/uL (ref 4.0–10.5)
nRBC: 0 % (ref 0.0–0.2)

## 2019-08-16 LAB — COMPREHENSIVE METABOLIC PANEL
ALT: 10 U/L (ref 0–44)
AST: 41 U/L (ref 15–41)
Albumin: 4.2 g/dL (ref 3.5–5.0)
Alkaline Phosphatase: 58 U/L (ref 38–126)
Anion gap: 12 (ref 5–15)
BUN: 18 mg/dL (ref 8–23)
CO2: 23 mmol/L (ref 22–32)
Calcium: 10.2 mg/dL (ref 8.9–10.3)
Chloride: 107 mmol/L (ref 98–111)
Creatinine, Ser: 1.48 mg/dL — ABNORMAL HIGH (ref 0.61–1.24)
GFR calc Af Amer: 52 mL/min — ABNORMAL LOW (ref >60)
GFR calc non Af Amer: 45 mL/min — ABNORMAL LOW (ref >60)
Glucose, Bld: 113 mg/dL — ABNORMAL HIGH (ref 70–99)
Potassium: 4.4 mmol/L (ref 3.5–5.1)
Sodium: 142 mmol/L (ref 135–145)
Total Bilirubin: 2.3 mg/dL — ABNORMAL HIGH (ref 0.3–1.2)
Total Protein: 7.1 g/dL (ref 6.5–8.1)

## 2019-08-16 LAB — TROPONIN I (HIGH SENSITIVITY)
Troponin I (High Sensitivity): 223 ng/L (ref <18)
Troponin I (High Sensitivity): 330 ng/L (ref <18)
Troponin I (High Sensitivity): 348 ng/L (ref <18)

## 2019-08-16 LAB — TSH: TSH: 1.217 u[IU]/mL (ref 0.350–4.500)

## 2019-08-16 MED ORDER — ABIRATERONE ACETATE 250 MG PO TABS
1000.0000 mg | ORAL_TABLET | Freq: Every day | ORAL | Status: DC
  Administered 2019-08-17 – 2019-08-18 (×2): 1000 mg via ORAL
  Filled 2019-08-16 (×2): qty 4

## 2019-08-16 MED ORDER — ADENOSINE 12 MG/4ML IV SOLN
INTRAVENOUS | Status: AC
  Filled 2019-08-16: qty 4

## 2019-08-16 MED ORDER — DILTIAZEM HCL ER COATED BEADS 240 MG PO CP24
240.0000 mg | ORAL_CAPSULE | Freq: Once | ORAL | Status: AC
  Administered 2019-08-16: 240 mg via ORAL
  Filled 2019-08-16 (×3): qty 1

## 2019-08-16 MED ORDER — DILTIAZEM HCL 25 MG/5ML IV SOLN
25.0000 mg | Freq: Once | INTRAVENOUS | Status: AC
  Administered 2019-08-16: 25 mg via INTRAVENOUS

## 2019-08-16 MED ORDER — APIXABAN 5 MG PO TABS
5.0000 mg | ORAL_TABLET | Freq: Two times a day (BID) | ORAL | Status: DC
  Administered 2019-08-16 – 2019-08-18 (×4): 5 mg via ORAL
  Filled 2019-08-16 (×4): qty 1

## 2019-08-16 MED ORDER — CARBIDOPA-LEVODOPA 25-100 MG PO TABS
2.0000 | ORAL_TABLET | ORAL | Status: DC
  Administered 2019-08-17 – 2019-08-18 (×5): 2 via ORAL
  Filled 2019-08-16 (×7): qty 2

## 2019-08-16 MED ORDER — CLONAZEPAM 0.5 MG PO TABS
0.5000 mg | ORAL_TABLET | Freq: Every day | ORAL | Status: DC
  Administered 2019-08-17 (×2): 0.5 mg via ORAL
  Filled 2019-08-16 (×2): qty 1

## 2019-08-16 MED ORDER — ONDANSETRON HCL 4 MG/2ML IJ SOLN: 4.0000 mg | Freq: Four times a day (QID) | INTRAMUSCULAR | Status: DC | PRN

## 2019-08-16 MED ORDER — CARBIDOPA-LEVODOPA 25-100 MG PO TABS
1.0000 | ORAL_TABLET | ORAL | Status: DC
  Administered 2019-08-17: 1 via ORAL
  Filled 2019-08-16 (×2): qty 1

## 2019-08-16 MED ORDER — ADENOSINE 6 MG/2ML IV SOLN
6.0000 mg | Freq: Once | INTRAVENOUS | Status: AC
  Administered 2019-08-16: 6 mg via INTRAVENOUS
  Filled 2019-08-16: qty 2

## 2019-08-16 MED ORDER — DILTIAZEM HCL 25 MG/5ML IV SOLN
INTRAVENOUS | Status: AC
  Filled 2019-08-16: qty 5

## 2019-08-16 MED ORDER — ACETAMINOPHEN 325 MG PO TABS
650.0000 mg | ORAL_TABLET | ORAL | Status: DC | PRN
  Administered 2019-08-16: 650 mg via ORAL
  Filled 2019-08-16: qty 2

## 2019-08-16 MED ORDER — TAMSULOSIN HCL 0.4 MG PO CAPS
0.4000 mg | ORAL_CAPSULE | Freq: Every day | ORAL | Status: DC
  Administered 2019-08-17 – 2019-08-18 (×2): 0.4 mg via ORAL
  Filled 2019-08-16 (×2): qty 1

## 2019-08-16 MED ORDER — DILTIAZEM HCL 100 MG IV SOLR
5.0000 mg/h | INTRAVENOUS | Status: DC
  Administered 2019-08-16: 5 mg/h via INTRAVENOUS
  Filled 2019-08-16: qty 100

## 2019-08-16 NOTE — ED Triage Notes (Signed)
Pt sent for elevated HR.  Pt c/o chest discomfort.  No SHOB. No fever or cough.  Sx started today when pt woke up.

## 2019-08-16 NOTE — ED Notes (Signed)
Pt assisted to bathroom

## 2019-08-16 NOTE — ED Notes (Signed)
Date and time results received: 08/16/19 1432   (use smartphrase ".now" to insert current time)  Test: trop Critical Value: 232  Name of Provider Notified: malinda md  Orders Received? Or Actions Taken?: Actions Taken: md malinda made aware of results.

## 2019-08-16 NOTE — ED Provider Notes (Signed)
[facility] Emergency Department Provider Note   ____________________________________________   First MD Initiated Contact with Patient 08/16/19 1511     (approximate)  I have reviewed the triage vital signs and the nursing notes.   HISTORY  Chief Complaint Tachycardia    HPI [name] [name] is a 78 y.o. male who went to his doctor's office with his heart racing.  His doctor sent him to the cardiologist who then sent him here to the emergency room.  Here in the emergency room patient reports his heart has been racing.  He told me he did not have any chest discomfort apparently he told the triage nurse he did.  He has been having symptoms since he woke up this morning.  Patient says years ago he had several episodes like this but they resolved and have stayed gone.  He does not drink caffeine.  He has about 2 beers every other day but that is it.  Discussed him in detail with Dr. Laverle Patter shows.  We tried some adenosine IV which revealed a flutter.  Then we gave him some diltiazem IV which for about 5 to 10 minutes brought him down to a flutter at a normal rate but he sped back up again.  We therefore we after discussing again with Dr. Pressures gave him some p.o. diltiazem however over an hour later he is still not change his heart rate.  Dr. Saralyn Pilar is not worried about his troponin.  However since the patient is still not slowing his heart rate we will go ahead and start him on a dill drip and get him in the hospital.         Past Medical History:  Diagnosis Date  . Parkinson's disease ([place])   . REM sleep behavior disorder   . Sleep apnea     Patient Active Problem List   Diagnosis Date Noted  . Goals of care, counseling/discussion 07/17/2019  . 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 Establishing care with new doctor, encounter for 09/26/2018  . 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 Parkinson's disease ([place]) 11/07/2015  . 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 Hx of colonic polyps 05/18/2014  . BPH (benign prostatic hyperplasia) 01/12/2014  . Obstructive sleep apnea (adult) (pediatric) 01/17/2012  . 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 APPENDECTOMY    . 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 Authorizing Provider  abiraterone acetate (ZYTIGA) 250 MG tablet Take 4 tablets (1,000 mg total) by mouth daily. Take on an empty stomach 1 hour before or 2 hours after a meal 06/19/19   Cammie Sickle, MD  carbidopa-levodopa (SINEMET IR) 25-100 MG tablet Take 2 tablets by mouth 3 (three) times daily. TAKE 2 tablets at 7AM, 2 tablet at 11AM, 2 tablets at 3PM, 1 tablet at [facility] 10/27/17   [provider]  clonazePAM (KLONOPIN) 0.5 MG tablet Take 1 tablet by mouth at bedtime. Dr Lisabeth Devoid 09/09/18 08/16/19  [provider]  Glycerin-Hypromellose-PEG 400 (VISINE DRY EYE OP) Apply to eye. otc    [provider]  ibuprofen (ADVIL,MOTRIN) 600 MG tablet Take by mouth as needed. otc    [provider]  ketoconazole (NIZORAL) 2 % shampoo  02/15/19   [provider]  predniSONE (DELTASONE) 10 MG tablet Take 1 tablet (  10 mg total) by mouth daily with breakfast. 06/19/19   Cammie Sickle, MD  tamsulosin (FLOMAX) 0.4 MG CAPS capsule Take 1 capsule (0.4 mg total) by mouth daily. Patient taking differently: Take 0.4 mg by mouth daily. [place] 04/25/19   Billey Co, MD    Allergies Influenza vaccines  Family History  Problem Relation Age of Onset  . Heart disease Maternal Grandfather   . Diabetes Paternal Grandfather   . Breast cancer Daughter     Social History Social History   Tobacco Use  . Smoking status: Former Smoker    Types: Cigarettes, Cigars    Quit date: 09/07/2010    Years since quitting: 8.9  .  Smokeless tobacco: Never Used  Substance Use Topics  . Alcohol use: Yes    Comment: 1 can beer/day  . Drug use: Never    Review of Systems  Constitutional: No fever/chills Eyes: No visual changes. ENT: No sore throat. Cardiovascular: Denies chest pain. Respiratory: Denies shortness of breath. Gastrointestinal: No abdominal pain.  No nausea, no vomiting.  No diarrhea.  No constipation. Genitourinary: Negative for dysuria. Musculoskeletal: Negative for back pain. Skin: Negative for rash. Neurological: Negative for headaches, focal weakness   ____________________________________________   PHYSICAL EXAM:  VITAL SIGNS: ED Triage Vitals [08/16/19 1506]  Enc Vitals Group     BP (!) 144/64     Pulse Rate (!) 136     Resp 20     Temp 97.7 F (36.5 C)     Temp Source Oral     SpO2 96 %     Weight 158 lb (71.7 kg)     Height 5\' 9"  (1.753 m)     Head Circumference      Peak Flow      Pain Score 5     Pain Loc      Pain Edu?      Excl. in [place]?     Constitutional: Alert and oriented. Well appearing and in no acute distress. Eyes: Conjunctivae are normal.  Head: Atraumatic. Nose: No congestion/rhinnorhea. Mouth/Throat: Mucous membranes are moist.  Oropharynx non-erythematous. Neck: No stridor.  Cardiovascular: Normal rate, regular rhythm. Grossly normal heart sounds.  Good peripheral circulation. Respiratory: Normal respiratory effort.  No retractions. Lungs CTAB. Gastrointestinal: Soft and nontender. No distention. No abdominal bruits. No CVA tenderness. Musculoskeletal: No lower extremity tenderness nor edema.  Neurologic:  Normal speech and language. No gross focal neurologic deficits are appreciated.  Skin:  Skin is warm, dry and intact. No rash noted.   ____________________________________________   LABS (all labs ordered are listed, but only abnormal results are displayed)  Labs Reviewed  COMPREHENSIVE METABOLIC PANEL - Abnormal; Notable for the following  components:      Result Value   Glucose, Bld 113 (*)    Creatinine, Ser 1.48 (*)    Total Bilirubin 2.3 (*)    GFR calc non Af Amer 45 (*)    GFR calc Af Amer 52 (*)    All other components within normal limits  TROPONIN I (HIGH SENSITIVITY) - Abnormal; Notable for the following components:   Troponin I (High Sensitivity) 223 (*)    All other components within normal limits  TROPONIN I (HIGH SENSITIVITY) - Abnormal; Notable for the following components:   Troponin I (High Sensitivity) 330 (*)    All other components within normal limits  CBC WITH DIFFERENTIAL/PLATELET  TSH   ____________________________________________  EKG EKG #1 read and interpreted by me shows a flutter  at a rate of 136 left axis nonspecific ST-T changes incomplete right bundle branch block and computer is reading left anterior hemiblock. EKG #2 no change from #1.  I read interpreted both of these ____________________________________________  RADIOLOGY  ED MD interpretation: Chest x-ray read by radiology reviewed by me shows no active disease  Official radiology report(s): Dg Chest Portable 1 View  Result Date: 08/16/2019 CLINICAL DATA:  Atrial flutter. EXAM: PORTABLE CHEST 1 VIEW COMPARISON:  None. FINDINGS: The heart size and mediastinal contours are within normal limits. Both lungs are clear. No pneumothorax or pleural effusion is noted. The visualized skeletal structures are unremarkable. IMPRESSION: No active disease. Electronically Signed   By: Marijo Conception M.D.   On: 08/16/2019 15:38    ____________________________________________   PROCEDURES  Procedure(s) performed (including Critical Care):  Procedures   ____________________________________________   INITIAL IMPRESSION / ASSESSMENT AND PLAN / ED COURSE ANTRON GLAVIANO was evaluated in Emergency Department on 08/16/2019 for the symptoms described in the history of present illness. He was evaluated in the context of the global COVID-19  pandemic, which necessitated consideration that the patient might be at risk for infection with the SARS-CoV-2 virus that causes COVID-19. Institutional protocols and algorithms that pertain to the evaluation of patients at risk for COVID-19 are in a state of rapid change based on information released by regulatory bodies including the CDC and federal and state organizations. These policies and algorithms were followed during the patient's care in the ED.    Patient with no response to p.o. diltiazem we will start IV drip and the plan on admitting him.          ____________________________________________   FINAL CLINICAL IMPRESSION(S) / ED DIAGNOSES  Final diagnoses:  Atrial flutter with rapid ventricular response [facility])     ED Discharge Orders    None       Note:  This document was prepared using Dragon voice recognition software and may include unintentional dictation errors.    Nena Polio, MD 08/16/19 [id number]

## 2019-08-16 NOTE — ED Notes (Signed)
Pt assisted to restroom.  

## 2019-08-16 NOTE — ED Notes (Signed)
Pt's HR consistently in the 85-90s. Pt continues to be asymptomatic. Will continue to monitor HR and BP.

## 2019-08-16 NOTE — ED Notes (Addendum)
Patient denies pain, CP or SOB and is resting comfortably. Pt HR is still in the 140s. Will continue to monitor.

## 2019-08-16 NOTE — Progress Notes (Signed)
Date:  08/16/2019   Name:  [name] [name]   DOB:  [birth year]   MRN:  [id number]   Chief Complaint: Palpitations (elevated b/p and pulse reading at home x 7-10 days ago. Checking b/p when I feel "anxiety attack")  Palpitations  This is a new problem. The current episode started 1 to 4 weeks ago (10 - 14 days). The problem occurs every several days. The problem has been waxing and waning. Episode Length: 1-2. Nothing (? Zytiga) aggravates the symptoms. Associated symptoms include anxiety, chest fullness and chest pain. Pertinent negatives include no coughing, diaphoresis, dizziness, fever, irregular heartbeat, malaise/fatigue, nausea, near-syncope, shortness of breath or syncope. He has tried nothing for the symptoms. The treatment provided moderate relief.  Chest Pain  This is a new problem. The current episode started in the past 7 days. The onset quality is sudden. The problem occurs every several days. The problem has been waxing and waning. The pain is present in the substernal region. The quality of the pain is described as tightness. Radiates to: throat tightness. Associated symptoms include palpitations. Pertinent negatives include no abdominal pain, back pain, cough, diaphoresis, dizziness, fever, headaches, irregular heartbeat, lower extremity edema, malaise/fatigue, nausea, near-syncope, shortness of breath or syncope. The pain is aggravated by nothing (? stress).    Lab Results  Component Value Date   CREATININE 1.42 (H) 08/14/2019   BUN 19 08/14/2019   NA 140 08/14/2019   K 3.7 08/14/2019   CL 109 08/14/2019   CO2 23 08/14/2019   No results found for: CHOL, HDL, LDLCALC, LDLDIRECT, TRIG, CHOLHDL No results found for: TSH No results found for: HGBA1C   Review of Systems  Constitutional: Negative for chills, diaphoresis, fever and malaise/fatigue.  HENT: Negative for drooling, ear discharge, ear pain and sore throat.   Respiratory: Negative for cough, shortness of breath  and wheezing.   Cardiovascular: Positive for chest pain and palpitations. Negative for leg swelling, syncope and near-syncope.  Gastrointestinal: Negative for abdominal pain, blood in stool, constipation, diarrhea and nausea.  Endocrine: Negative for polydipsia.  Genitourinary: Negative for dysuria, frequency, hematuria and urgency.  Musculoskeletal: Negative for back pain, myalgias and neck pain.  Skin: Negative for rash.  Allergic/Immunologic: Negative for environmental allergies.  Neurological: Negative for dizziness and headaches.  Hematological: Does not bruise/bleed easily.  Psychiatric/Behavioral: Negative for suicidal ideas. The patient is nervous/anxious.     Patient Active Problem List   Diagnosis Date Noted  . Goals of care, counseling/discussion 07/17/2019  . 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 Establishing care with new doctor, encounter for 09/26/2018  . 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 Parkinson's disease ([place]) 11/07/2015  . 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 Hx of colonic polyps 05/18/2014  . BPH (benign prostatic hyperplasia) 01/12/2014  . Obstructive sleep apnea (adult) (pediatric) 01/17/2012  . 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 Influenza Vaccines Anaphylaxis    Flu shot: 1974 anaphylaxis. 2nd time swollen arm Flu shot: 1974 anaphylaxis. 2nd time swollen arm     Past Surgical History:  Procedure Laterality Date  . APPENDECTOMY    . 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 Smoking status: Former Smoker    Types: Cigarettes, Cigars    Quit date: 09/07/2010    Years since quitting: 8.9  . Smokeless tobacco: Never Used  Substance Use Topics  .  Alcohol use: Yes    Comment: 1 can beer/day  . Drug use: Never     Medication list has been reviewed and updated.  Current Meds  Medication Sig  . abiraterone acetate (ZYTIGA) 250  MG tablet Take 4 tablets (1,000 mg total) by mouth daily. Take on an empty stomach 1 hour before or 2 hours after a meal  . carbidopa-levodopa (SINEMET IR) 25-100 MG tablet Take 2 tablets by mouth 3 (three) times daily. TAKE 2 tablets at 7AM, 2 tablet at 11AM, 2 tablets at 3PM, 1 tablet at 7PM  . clonazePAM (KLONOPIN) 0.5 MG tablet Take 1 tablet by mouth at bedtime. Dr Lisabeth Devoid  . Glycerin-Hypromellose-PEG 400 (VISINE DRY EYE OP) Apply to eye. otc  . ibuprofen (ADVIL,MOTRIN) 600 MG tablet Take by mouth as needed. otc  . ketoconazole (NIZORAL) 2 % shampoo   . predniSONE (DELTASONE) 10 MG tablet Take 1 tablet (10 mg total) by mouth daily with breakfast.  . tamsulosin (FLOMAX) 0.4 MG CAPS capsule Take 1 capsule (0.4 mg total) by mouth daily. (Patient taking differently: Take 0.4 mg by mouth daily. [place])    PHQ 2/9 Scores 08/16/2019 03/16/2019 01/11/2019 09/26/2018  PHQ - 2 Score 0 0 0 0  PHQ- 9 Score 0 0 0 0    BP Readings from Last 3 Encounters:  08/16/19 (!) 108/91  08/14/19 108/67  07/27/19 102/61    Physical Exam Vitals signs and nursing note reviewed.  HENT:     Head: Normocephalic.     Right Ear: Tympanic membrane, ear canal and external ear normal.     Left Ear: Tympanic membrane, ear canal and external ear normal.     Nose: Nose normal.  Eyes:     General: No scleral icterus.       Right eye: No discharge.        Left eye: No discharge.     Conjunctiva/sclera: Conjunctivae normal.     Pupils: Pupils are equal, round, and reactive to light.  Neck:     Musculoskeletal: Normal range of motion and neck supple.     Thyroid: No thyromegaly.     Vascular: No JVD.     Trachea: No tracheal deviation.  Cardiovascular:     Rate and Rhythm: Regular rhythm. Tachycardia present.     Heart sounds: Normal heart sounds. No murmur. No friction rub. No gallop.   Pulmonary:     Effort: No respiratory distress.     Breath sounds: Normal breath sounds. No wheezing or rales.  Abdominal:      General: Bowel sounds are normal.     Palpations: Abdomen is soft. There is no mass.     Tenderness: There is no abdominal tenderness. There is no right CVA tenderness, left CVA tenderness, guarding or rebound.  Musculoskeletal: Normal range of motion.        General: No tenderness.  Lymphadenopathy:     Cervical: No cervical adenopathy.  Skin:    General: Skin is warm.     Findings: No rash.  Neurological:     Mental Status: He is alert and oriented to person, place, and time.     Cranial Nerves: No cranial nerve deficit.     Motor: No weakness.     Deep Tendon Reflexes: Reflexes are normal and symmetric.     Wt Readings from Last 3 Encounters:  08/16/19 158 lb (71.7 kg)  08/14/19 158 lb (71.7 kg)  07/27/19 158 lb (71.7 kg)    BP Marland Kitchen)  108/91   Pulse 96   Ht 5\' 9"  (1.753 m)   Wt 158 lb (71.7 kg)   BMI 23.33 kg/m   Assessment and Plan: 1. Palpitations For the past 10 to 14 days patient is felt palpitations for which he bought a blood pressure cuff.  Even though blood pressure has not been elevated he has noted that his pulse rate has been elevated at times which may last 1 to 2 hours.  There is no association of dizziness near syncope or nausea diaphoresis or dyspnea. - EKG 12-Lead  2. Tachycardia EKG notes that he has a pulse rate of 130 with no discernible P waves.  This appears to either be in SVT versus an atrial flutter/fib with rapid ventricular response.  Will refer to cardiology for evaluation patient is to be seen this afternoon by Dr. Josefa Half for evaluation and treatment.  Attempts to slow the ventricular rate down by carotid massage were not successful.  Nurse noted that there was initially a pulse rate of 88 and then it went to 96 manually but with EKG was noted to be in the 130 range. I have reviewed EKG which shows an undetermined etiology of tachycardia as noted SVT versus A. fib flutter with rapid ventricular response.  There is also noted to be a right bundle  branch block which is present on EKG done in 2015.   Marland Kitchen Comparison to previous EKG dated 2015 which is noted to be in sinus bradycardia with right bundle branch block but no ischemic changes. 3. Chest discomfort Patient when he is having the rapid heart rate and not particularly right now has noticed some chest discomfort and one time it went up into the throat there was almost a squeezing sensation.  This is concerning for possible anginal circumstance and this may need to be further evaluated for CAD.

## 2019-08-17 ENCOUNTER — Inpatient Hospital Stay
Admit: 2019-08-17 | Discharge: 2019-08-17 | Disposition: A | Discharge status: Home / Self Care | Payer: Medicare Other | Attending: Internal Medicine | Admitting: Internal Medicine

## 2019-08-17 ENCOUNTER — Other Ambulatory Visit: Payer: Self-pay

## 2019-08-17 LAB — CBC
HCT: 44.4 % (ref 39.0–52.0)
Hemoglobin: 14.6 g/dL (ref 13.0–17.0)
MCH: 30.8 pg (ref 26.0–34.0)
MCHC: 32.9 g/dL (ref 30.0–36.0)
MCV: 93.7 fL (ref 80.0–100.0)
Platelets: 223 10*3/uL (ref 150–400)
RBC: 4.74 MIL/uL (ref 4.22–5.81)
RDW: 13.9 % (ref 11.5–15.5)
WBC: 10.3 10*3/uL (ref 4.0–10.5)
nRBC: 0 % (ref 0.0–0.2)

## 2019-08-17 LAB — LIPID PANEL
Cholesterol: 216 mg/dL — ABNORMAL HIGH (ref 0–200)
HDL: 72 mg/dL (ref >40)
LDL Cholesterol: 124 mg/dL — ABNORMAL HIGH (ref 0–99)
Total CHOL/HDL Ratio: 3 RATIO
Triglycerides: 98 mg/dL (ref <150)
VLDL: 20 mg/dL (ref 0–40)

## 2019-08-17 LAB — BASIC METABOLIC PANEL
Anion gap: 8 (ref 5–15)
BUN: 16 mg/dL (ref 8–23)
CO2: 24 mmol/L (ref 22–32)
Calcium: 9.2 mg/dL (ref 8.9–10.3)
Chloride: 108 mmol/L (ref 98–111)
Creatinine, Ser: 1.3 mg/dL — ABNORMAL HIGH (ref 0.61–1.24)
GFR calc Af Amer: >60 mL/min (ref >60)
GFR calc non Af Amer: 53 mL/min — ABNORMAL LOW (ref >60)
Glucose, Bld: 103 mg/dL — ABNORMAL HIGH (ref 70–99)
Potassium: 3.6 mmol/L (ref 3.5–5.1)
Sodium: 140 mmol/L (ref 135–145)

## 2019-08-17 LAB — ECHOCARDIOGRAM COMPLETE
Height: 69 in
Weight: 2527.99 oz

## 2019-08-17 LAB — SARS CORONAVIRUS 2 (TAT 6-24 HRS): SARS Coronavirus 2: NEGATIVE

## 2019-08-17 LAB — TROPONIN I (HIGH SENSITIVITY): Troponin I (High Sensitivity): 539 ng/L (ref <18)

## 2019-08-17 MED ORDER — DILTIAZEM HCL 30 MG PO TABS
30.0000 mg | ORAL_TABLET | Freq: Four times a day (QID) | ORAL | Status: DC
  Administered 2019-08-17 – 2019-08-18 (×4): 30 mg via ORAL
  Filled 2019-08-17 (×4): qty 1

## 2019-08-17 MED ORDER — PREDNISONE 10 MG PO TABS
10.0000 mg | ORAL_TABLET | Freq: Every day | ORAL | Status: DC
  Administered 2019-08-18: 10 mg via ORAL
  Filled 2019-08-17: qty 1

## 2019-08-17 NOTE — Progress Notes (Signed)
PROGRESS NOTE                                                                                                                                                                                                             Patient Demographics:    [name] [name], is a 78 y.o. male, DOB - 1941/01/06, [id number]  Admit date - 08/16/2019   Admitting Physician Athena Masse, MD  Outpatient Primary MD for the patient is Juline Patch, MD  LOS - 1  Outpatient Specialists: None  Chief Complaint  Patient presents with  . Tachycardia       Brief Narrative 78 year old male with history of Parkinson's disease, recently diagnosed prostate cancer, OSA on nighttime CPAP, anxiety, presented with palpitations for past 1 week.  On the day of admission he woke up early in the morning with palpitations and feeling weak.  Denies any chest pain, shortness of breath, fevers, chills, nausea, vomiting, abdominal pain, weight loss tremors or recent illness.  His PCP referred him to cardiologist to then sent him to the ED. In the ED his heart rate was in the 140s and in rapid A.  Flutter. Received 6 mg IV hydration with no improvement and then got IV Cardizem bolus followed by infusion.  Blood work showed mildly elevated troponin. Admitted for further management.   Subjective:   Patient seen and examined.  Denies palpitation at this time.  Heart rate better controlled with Cardizem.   Assessment  & Plan :    Principal Problem:   New onset atrial flutter with RVR (HCC) No clear etiology.  Rate controlled with Cardizem infusion, transition to p.o. Cardizem 30 mg every 6 hours.  Transition to long-acting Cardizem in a.m. if heart rate stable.  Check TSH.  Monitor K and mag.  He is CHA2DS2-VASc score is 2 and started on Eliquis. Check 2D echo.  Cardiology consulted and will follow up with recommendations.   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  Elevated troponin No chest pain symptoms.  Follow 2D echo.    Sleep apnea treated with nocturnal BiPAP Stable.    Parkinson's disease ([place]) Stable.  Continue Sinemet    Prostate cancer metastatic to bone [facility]) Outpatient follow-up.    Code Status : Full code  Family Communication  :  None  Disposition Plan  : Home tomorrow if heart rate controlled  Barriers For Discharge : Active symptoms  Consults  : Cardiology  Procedures  : 2D echo  DVT Prophylaxis  : Eliquis  Lab Results  Component Value Date   PLT 223 08/17/2019    Antibiotics  :    Anti-infectives (From admission, onward)   None        Objective:   Vitals:   08/17/19 0938 08/17/19 0955 08/17/19 1000 08/17/19 1119  BP: 111/86  104/84 105/81  Pulse: 89  80 80  Resp: (!) 28  (!) 27 15  Temp:      TempSrc:      SpO2: 99%  99% 99%  Weight:  71.7 kg    Height:  5\' 9"  (1.753 m)      Wt Readings from Last 3 Encounters:  08/17/19 71.7 kg  08/16/19 71.7 kg  08/14/19 71.7 kg     Intake/Output Summary (Last 24 hours) at 08/17/2019 1300 Last data filed at 08/17/2019 0954 Gross per 24 hour  Intake -  Output 1150 ml  Net -1150 ml     Physical Exam  Gen: not in distress HEENT: , moist mucosa, supple neck Chest: clear b/l, no added sounds CVS: S1-S2 irregularly irregular, no murmurs rub or gallop GI: soft, NT, ND, BS+ Musculoskeletal: warm, no edema     Data Review:    CBC Recent Labs  Lab 08/14/19 0953 08/16/19 1505 08/17/19 0445  WBC 9.7 9.0 10.3  HGB 14.5 16.2 14.6  HCT 45.2 48.4 44.4  PLT 236 265 223  MCV 95.8 92.0 93.7  MCH 30.7 30.8 30.8  MCHC 32.1 33.5 32.9  RDW 13.9 14.1 13.9  LYMPHSABS 1.6 1.0  --   MONOABS 0.6 0.4  --   EOSABS 0.1 0.0  --   BASOSABS 0.1 0.0  --     Chemistries  Recent Labs  Lab 08/14/19 0953 08/16/19 1505 08/17/19 0445  NA 140 142 140  K 3.7 4.4 3.6  CL 109 107 108  CO2 23  23 24   GLUCOSE 122* 113* 103*  BUN 19 18 16   CREATININE 1.42* 1.48* 1.30*  CALCIUM 9.7 10.2 9.2  AST 33 41  --   ALT 8 10  --   ALKPHOS 58 58  --   BILITOT 1.8* 2.3*  --    ------------------------------------------------------------------------------------------------------------------ Recent Labs    08/17/19 0445  CHOL 216*  HDL 72  LDLCALC 124*  TRIG 98  CHOLHDL 3.0    No results found for: HGBA1C ------------------------------------------------------------------------------------------------------------------ Recent Labs    08/16/19 1505  TSH 1.217   ------------------------------------------------------------------------------------------------------------------ No results for input(s): VITAMINB12, FOLATE, FERRITIN, TIBC, IRON, RETICCTPCT in the last 72 hours.  Coagulation profile No results for input(s): INR, PROTIME in the last 168 hours.  No results for input(s): DDIMER in the last 72 hours.  Cardiac Enzymes No results for input(s): CKMB, TROPONINI, MYOGLOBIN in the last 168 hours.  Invalid input(s): CK ------------------------------------------------------------------------------------------------------------------ No results found for: BNP  Inpatient Medications  Scheduled Meds: . abiraterone acetate  1,000 mg Oral Daily  . apixaban  5 mg Oral BID  . carbidopa-levodopa  1 tablet Oral Q24H  . carbidopa-levodopa  2 tablet Oral 3 times per day  . clonazePAM  0.5 mg Oral QHS  . diltiazem  30 mg Oral Q6H  . tamsulosin  0.4 mg Oral Daily   Continuous Infusions: PRN Meds:.acetaminophen, ondansetron (ZOFRAN) IV  Micro Results Recent Results (from the past  240 hour(s))  SARS CORONAVIRUS 2 (TAT 6-24 HRS) Nasopharyngeal Nasopharyngeal Swab     Status: None   Collection Time: 08/16/19  7:35 PM   Specimen: Nasopharyngeal Swab  Result Value Ref Range Status   SARS Coronavirus 2 NEGATIVE NEGATIVE Final    Comment: (NOTE) SARS-CoV-2 target nucleic acids are  NOT DETECTED. The SARS-CoV-2 RNA is generally detectable in upper and lower respiratory specimens during the acute phase of infection. Negative results do not preclude SARS-CoV-2 infection, do not rule out co-infections with other pathogens, and should not be used as the sole basis for treatment or other patient management decisions. Negative results must be combined with clinical observations, patient history, and epidemiological information. The expected result is Negative. Fact Sheet for Patients: SugarRoll.be Fact Sheet for Healthcare Providers: https://www.woods-mathews.com/ This test is not yet approved or cleared by the [place] FDA and  has been authorized for detection and/or diagnosis of SARS-CoV-2 by FDA under an Emergency Use Authorization (EUA). This EUA will remain  in effect (meaning this test can be used) for the duration of the COVID-19 declaration under Section 56 4(b)(1) of the Act, 21 U.S.C. section 360bbb-3(b)(1), unless the authorization is terminated or revoked sooner. Performed at [facility], [place] [address], [place], [place] [address]     Radiology Reports DG Chest Portable 1 View  Result Date: 08/16/2019 CLINICAL DATA:  Atrial flutter. EXAM: PORTABLE CHEST 1 VIEW COMPARISON:  None. FINDINGS: The heart size and mediastinal contours are within normal limits. Both lungs are clear. No pneumothorax or pleural effusion is noted. The visualized skeletal structures are unremarkable. IMPRESSION: No active disease. Electronically Signed   By: Marijo Conception M.D.   On: 08/16/2019 15:38    Time Spent in minutes  25   Mairely Foxworth M.D on 08/17/2019 at 1:00 PM  Between 7am to 7pm - Pager - [phone]  After 7pm go to [email or web address] - password [facility]  [facility] -  Office  [phone]

## 2019-08-17 NOTE — ED Notes (Signed)
Pt given meal tray at this time 

## 2019-08-17 NOTE — ED Notes (Signed)
Pt up to toilet at this time. ?

## 2019-08-17 NOTE — ED Notes (Signed)
Pt updated on room status and plan of care

## 2019-08-17 NOTE — ED Notes (Signed)
Attempted to call floor for room assignment, per charge nurse they are unable to take any patients at this time and they will be contacting the nursing supervisor. Will inform charge nurse RN Marya Amsler.

## 2019-08-17 NOTE — ED Notes (Signed)
ED TO INPATIENT HANDOFF REPORT  ED Nurse Name and Phone #: Bascom Levels Name/Age/Gender [name] [name] 78 y.o. male Room/Bed: ED14A/ED14A  Code Status   Code Status: Full Code  Home/SNF/Other Home Patient oriented to: self, place, time and situation Is this baseline? Yes   Triage Complete: Triage complete  Chief Complaint Atrial flutter with rapid ventricular response ([place]) [I48.92]  Triage Note Pt sent for elevated HR.  Pt c/o chest discomfort.  No SHOB. No fever or cough.  Sx started today when pt woke up.      Allergies Allergies  Allergen Reactions  . Influenza Vaccines Anaphylaxis    Flu shot: 1974 anaphylaxis. 2nd time swollen arm Flu shot: 1974 anaphylaxis. 2nd time swollen arm     Level of Care/Admitting Diagnosis ED Disposition    ED Disposition Condition [facility] Area: [place] [[id number]]  Level of Care: Telemetry [5]  Covid Evaluation: Asymptomatic Screening Protocol (No Symptoms)  Diagnosis: Atrial flutter with rapid ventricular response [id number]  Admitting Physician: Athena Masse [id number]  Attending Physician: Athena Masse [id number]  Estimated length of stay: 3 - 4 days  Certification:: I certify this patient will need inpatient services for at least 2 midnights  PT Class (Do Not Modify): Inpatient [101]  PT Acc Code (Do Not Modify): Private [1]       B Medical/Surgery History Past Medical History:  Diagnosis Date  . Parkinson's disease ([place])   . REM sleep behavior disorder   . Sleep apnea    Past Surgical History:  Procedure Laterality Date  . APPENDECTOMY    . 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     A IV Location/Drains/Wounds Patient Lines/Drains/Airways Status   Active Line/Drains/Airways    Name:   Placement date:   Placement time:   Site:   Days:   Peripheral IV 08/16/19 Left Antecubital   08/16/19    1527    Antecubital   1          Intake/Output Last 24 hours  Intake/Output  Summary (Last 24 hours) at 08/17/2019 1936 Last data filed at 08/17/2019 [id number] Gross per 24 hour  Intake --  Output 650 ml  Net -650 ml    Labs/Imaging Results for orders placed or performed during the hospital encounter of 08/16/19 (from the past 48 hour(s))  Comprehensive metabolic panel     Status: Abnormal   Collection Time: 08/16/19  3:05 PM  Result Value Ref Range   Sodium 142 135 - 145 mmol/L   Potassium 4.4 3.5 - 5.1 mmol/L    Comment: HEMOLYSIS AT THIS LEVEL MAY AFFECT RESULT   Chloride 107 98 - 111 mmol/L   CO2 23 22 - 32 mmol/L   Glucose, Bld 113 (H) 70 - 99 mg/dL   BUN 18 8 - 23 mg/dL   Creatinine, Ser 1.48 (H) 0.61 - 1.24 mg/dL   Calcium 10.2 8.9 - 10.3 mg/dL   Total Protein 7.1 6.5 - 8.1 g/dL   Albumin 4.2 3.5 - 5.0 g/dL   AST 41 15 - 41 U/L   ALT 10 0 - 44 U/L   Alkaline Phosphatase 58 38 - 126 U/L   Total Bilirubin 2.3 (H) 0.3 - 1.2 mg/dL   GFR calc non Af Amer 45 (L) >60 mL/min   GFR calc Af Amer 52 (L) >60 mL/min   Anion gap 12 5 - 15    Comment: Performed at [facility], [address] [place], [place] [address]  CBC with Differential     Status: None   Collection Time: 08/16/19  3:05 PM  Result Value Ref Range   WBC 9.0 4.0 - 10.5 K/uL   RBC 5.26 4.22 - 5.81 MIL/uL   Hemoglobin 16.2 13.0 - 17.0 g/dL   HCT 48.4 39.0 - 52.0 %   MCV 92.0 80.0 - 100.0 fL   MCH 30.8 26.0 - 34.0 pg   MCHC 33.5 30.0 - 36.0 g/dL   RDW 14.1 11.5 - 15.5 %   Platelets 265 150 - 400 K/uL   nRBC 0.0 0.0 - 0.2 %   Neutrophils Relative % 83 %   Neutro Abs 7.5 1.7 - 7.7 K/uL   Lymphocytes Relative 12 %   Lymphs Abs 1.0 0.7 - 4.0 K/uL   Monocytes Relative 4 %   Monocytes Absolute 0.4 0.1 - 1.0 K/uL   Eosinophils Relative 0 %   Eosinophils Absolute 0.0 0.0 - 0.5 K/uL   Basophils Relative 0 %   Basophils Absolute 0.0 0.0 - 0.1 K/uL   Immature Granulocytes 1 %   Abs Immature Granulocytes 0.05 0.00 - 0.07 K/uL    Comment: Performed at [facility], [place], [place] [address]  Troponin I (High Sensitivity)     Status: Abnormal   Collection Time: 08/16/19  3:05 PM  Result Value Ref Range   Troponin I (High Sensitivity) 223 (HH) <18 ng/L    Comment: CRITICAL RESULT CALLED TO, READ BACK BY AND VERIFIED WITH YESSICA AGUAS @1631  08/16/19 MJU (NOTE) Elevated high sensitivity troponin I (hsTnI) values and significant  changes across serial measurements may suggest ACS but many other  chronic and acute conditions are known to elevate hsTnI results.  Refer to the "Links" section for chest pain algorithms and additional  guidance. Performed at [facility], [address] [place], [place] [address]   TSH     Status: None   Collection Time: 08/16/19  3:05 PM  Result Value Ref Range   TSH 1.217 0.350 - 4.500 uIU/mL    Comment: Performed by a 3rd Generation assay with a functional sensitivity of <=0.01 uIU/mL. Performed at [facility], [address] [place], [place] [address]   Troponin I (High Sensitivity)     Status: Abnormal   Collection Time: 08/16/19  5:54 PM  Result Value Ref Range   Troponin I (High Sensitivity) 330 (HH) <18 ng/L    Comment: CRITICAL VALUE NOTED. VALUE IS CONSISTENT WITH PREVIOUSLY REPORTED/CALLED VALUE MJU (NOTE) Elevated high sensitivity troponin I (hsTnI) values and significant  changes across serial measurements may suggest ACS but many other  chronic and acute conditions are known to elevate hsTnI results.  Refer to the "Links" section for chest pain algorithms and additional  guidance. Performed at [facility], [place], [place] [address]   SARS CORONAVIRUS 2 (TAT 6-24 HRS) Nasopharyngeal Nasopharyngeal Swab     Status: None   Collection Time: 08/16/19  7:35 PM   Specimen: Nasopharyngeal Swab  Result Value Ref Range   SARS Coronavirus 2 NEGATIVE NEGATIVE    Comment: (NOTE) SARS-CoV-2 target nucleic acids are NOT DETECTED. The SARS-CoV-2 RNA  is generally detectable in upper and lower respiratory specimens during the acute phase of infection. Negative results do not preclude SARS-CoV-2 infection, do not rule out co-infections with other pathogens, and should not be used as the sole basis for treatment or other patient management decisions. Negative results  must be combined with clinical observations, patient history, and epidemiological information. The expected result is Negative. Fact Sheet for Patients: SugarRoll.be Fact Sheet for Healthcare Providers: https://www.woods-mathews.com/ This test is not yet approved or cleared by the [place] FDA and  has been authorized for detection and/or diagnosis of SARS-CoV-2 by FDA under an Emergency Use Authorization (EUA). This EUA will remain  in effect (meaning this test can be used) for the duration of the COVID-19 declaration under Section 56 4(b)(1) of the Act, 21 U.S.C. section 360bbb-3(b)(1), unless the authorization is terminated or revoked sooner. Performed at [facility], [place] [address], [place], [place] [address]   Troponin I (High Sensitivity)     Status: Abnormal   Collection Time: 08/16/19  7:35 PM  Result Value Ref Range   Troponin I (High Sensitivity) 348 (HH) <18 ng/L    Comment: CRITICAL VALUE NOTED. VALUE IS CONSISTENT WITH PREVIOUSLY REPORTED/CALLED VALUE MJU (NOTE) Elevated high sensitivity troponin I (hsTnI) values and significant  changes across serial measurements may suggest ACS but many other  chronic and acute conditions are known to elevate hsTnI results.  Refer to the "Links" section for chest pain algorithms and additional  guidance. Performed at [facility], [address] [place], [place] [address]   Troponin I (High Sensitivity)     Status: Abnormal   Collection Time: 08/17/19 12:15 AM  Result Value Ref Range   Troponin I (High Sensitivity) 539 (HH) <18 ng/L    Comment: CRITICAL  VALUE NOTED. VALUE IS CONSISTENT WITH PREVIOUSLY REPORTED/CALLED VALUE SNG (NOTE) Elevated high sensitivity troponin I (hsTnI) values and significant  changes across serial measurements may suggest ACS but many other  chronic and acute conditions are known to elevate hsTnI results.  Refer to the "Links" section for chest pain algorithms and additional  guidance. Performed at [facility], [address] [place], [place] [id number]   Basic metabolic panel     Status: Abnormal   Collection Time: 08/17/19  4:45 AM  Result Value Ref Range   Sodium 140 135 - 145 mmol/L   Potassium 3.6 3.5 - 5.1 mmol/L   Chloride 108 98 - 111 mmol/L   CO2 24 22 - 32 mmol/L   Glucose, Bld 103 (H) 70 - 99 mg/dL   BUN 16 8 - 23 mg/dL   Creatinine, Ser 1.30 (H) 0.61 - 1.24 mg/dL   Calcium 9.2 8.9 - 10.3 mg/dL   GFR calc non Af Amer 53 (L) >60 mL/min   GFR calc Af Amer >60 >60 mL/min   Anion gap 8 5 - 15    Comment: Performed at [facility], [address] [place], [place] [address]  CBC     Status: None   Collection Time: 08/17/19  4:45 AM  Result Value Ref Range   WBC 10.3 4.0 - 10.5 K/uL   RBC 4.74 4.22 - 5.81 MIL/uL   Hemoglobin 14.6 13.0 - 17.0 g/dL   HCT 44.4 39.0 - 52.0 %   MCV 93.7 80.0 - 100.0 fL   MCH 30.8 26.0 - 34.0 pg   MCHC 32.9 30.0 - 36.0 g/dL   RDW 13.9 11.5 - 15.5 %   Platelets 223 150 - 400 K/uL   nRBC 0.0 0.0 - 0.2 %    Comment: Performed at [facility], [address], [place],  [address]  Lipid panel     Status: Abnormal   Collection Time: 08/17/19  4:45 AM  Result Value  Ref Range   Cholesterol 216 (H) 0 - 200 mg/dL   Triglycerides 98 <150 mg/dL   HDL 72 >40 mg/dL   Total CHOL/HDL Ratio 3.0 RATIO   VLDL 20 0 - 40 mg/dL   LDL Cholesterol 124 (H) 0 - 99 mg/dL    Comment:        Total Cholesterol/HDL:CHD Risk Coronary Heart Disease Risk Table                     Men   Women  1/2 Average Risk   3.4   3.3  Average Risk       5.0    4.4  2 X Average Risk   9.6   7.1  3 X Average Risk  23.4   11.0        Use the calculated Patient Ratio above and the CHD Risk Table to determine the patient's CHD Risk.        ATP III CLASSIFICATION (LDL):  <100     mg/dL   Optimal  100-129  mg/dL   Near or Above                    Optimal  130-159  mg/dL   Borderline  160-189  mg/dL   High  >190     mg/dL   Very High Performed at [facility], [address], [place], [place] [address]    DG Chest Portable 1 View  Result Date: 08/16/2019 CLINICAL DATA:  Atrial flutter. EXAM: PORTABLE CHEST 1 VIEW COMPARISON:  None. FINDINGS: The heart size and mediastinal contours are within normal limits. Both lungs are clear. No pneumothorax or pleural effusion is noted. The visualized skeletal structures are unremarkable. IMPRESSION: No active disease. Electronically Signed   By: Marijo Conception M.D.   On: 08/16/2019 15:38   ECHOCARDIOGRAM COMPLETE  Result Date: 08/17/2019   ECHOCARDIOGRAM REPORT   Patient Name:   [name] [name] Date of Exam: 08/17/2019 Medical Rec #:  [id number]        Height:       69.0 in Accession #:    [id number]       Weight:       158.0 lb Date of Birth:  [birth year]       BSA:          1.87 m Patient Age:    7 years         BP:           98/69 mmHg Patient Gender: M                HR:           102 bpm. Exam Location:  [facility] Procedure: 2D Echo, Cardiac Doppler and Color Doppler Indications:     Atrial Fibrillation 427.31  History:         Patient has no prior history of Echocardiogram examinations.                  Sleep apnea.  Sonographer:     Sherrie Sport RDCS (AE) Referring Phys:  [id number] [facility] Diagnosing Phys: Bartholome Bill MD IMPRESSIONS  1. Left ventricular ejection fraction, by visual estimation, is 50 to 55%. The left ventricle has low normal function. Left ventricular septal wall thickness was moderately increased. Moderately increased left ventricular posterior wall thickness. There  is moderately  increased left ventricular hypertrophy.  2. Left ventricular diastolic parameters are consistent with  Grade I diastolic dysfunction (impaired relaxation).  3. The left ventricle has no regional wall motion abnormalities.  4. Global right ventricle has normal systolic function.The right ventricular size is normal. No increase in right ventricular wall thickness.  5. Left atrial size was normal.  6. Right atrial size was mildly dilated.  7. The mitral valve was not well visualized. Trivial mitral valve regurgitation.  8. The tricuspid valve is not well visualized. Tricuspid valve regurgitation is trivial.  9. The aortic valve was not well visualized. Aortic valve regurgitation is trivial. 10. The pulmonic valve was not well visualized. Pulmonic valve regurgitation is trivial. 11. The aortic root was not well visualized. 12. Normal pulmonary artery systolic pressure. 13. The atrial septum is grossly normal. FINDINGS  Left Ventricle: Left ventricular ejection fraction, by visual estimation, is 50 to 55%. The left ventricle has low normal function. The left ventricle has no regional wall motion abnormalities. Moderately increased left ventricular posterior wall thickness. There is moderately increased left ventricular hypertrophy. Left ventricular diastolic parameters are consistent with Grade I diastolic dysfunction (impaired relaxation). Right Ventricle: The right ventricular size is normal. No increase in right ventricular wall thickness. Global RV systolic function is has normal systolic function. The tricuspid regurgitant velocity is 2.04 m/s, and with an assumed right atrial pressure  of 10 mmHg, the estimated right ventricular systolic pressure is normal at 26.6 mmHg. Left Atrium: Left atrial size was normal in size. Right Atrium: Right atrial size was mildly dilated Pericardium: There is no evidence of pericardial effusion. Mitral Valve: The mitral valve was not well visualized. Trivial mitral valve  regurgitation. Tricuspid Valve: The tricuspid valve is not well visualized. Tricuspid valve regurgitation is trivial. Aortic Valve: The aortic valve was not well visualized. Aortic valve regurgitation is trivial. Aortic valve mean gradient measures 1.5 mmHg. Aortic valve peak gradient measures 2.7 mmHg. Aortic valve area, by VTI measures 3.10 cm. Pulmonic Valve: The pulmonic valve was not well visualized. Pulmonic valve regurgitation is trivial. Pulmonic regurgitation is trivial. Aorta: The aortic root was not well visualized. IAS/Shunts: The atrial septum is grossly normal.  LEFT VENTRICLE PLAX 2D LVIDd:         3.94 cm LVIDs:         2.94 cm LV PW:         1.40 cm LV IVS:        1.19 cm LVOT diam:     2.10 cm LV SV:         34 ml LV SV Index:   18.28 LVOT Area:     3.46 cm  RIGHT VENTRICLE RV Basal diam:  3.79 cm LEFT ATRIUM             Index       RIGHT ATRIUM           Index LA diam:        3.70 cm 1.98 cm/m  RA Area:     18.90 cm LA Vol (A2C):   59.8 ml 31.99 ml/m RA Volume:   48.50 ml  25.95 ml/m LA Vol (A4C):   42.4 ml 22.69 ml/m LA Biplane Vol: 49.8 ml 26.64 ml/m  AORTIC VALVE AV Area (Vmax):    2.82 cm AV Area (Vmean):   2.76 cm AV Area (VTI):     3.10 cm AV Vmax:           82.50 cm/s AV Vmean:          52.450 cm/s AV VTI:  0.096 m AV Peak Grad:      2.7 mmHg AV Mean Grad:      1.5 mmHg LVOT Vmax:         67.20 cm/s LVOT Vmean:        41.800 cm/s LVOT VTI:          0.086 m LVOT/AV VTI ratio: 0.90  AORTA Ao Root diam: 3.50 cm MITRAL VALVE                       TRICUSPID VALVE MV Area (PHT): 3.21 cm            TR Peak grad:   16.6 mmHg MV PHT:        68.44 msec          TR Vmax:        204.00 cm/s MV Decel Time: 236 msec MV E velocity: 53.80 cm/s 103 cm/s SHUNTS                                    Systemic VTI:  0.09 m                                    Systemic Diam: 2.10 cm  Bartholome Bill MD Electronically signed by Bartholome Bill MD Signature Date/Time: 08/17/2019/4:13:35 PM    Final      Pending Labs Unresulted Labs (From admission, onward)   None      Vitals/Pain Today's Vitals   08/17/19 1715 08/17/19 1730 08/17/19 1800 08/17/19 1830  BP:  98/77 110/85 109/68  Pulse: (!) 58 (!) 45 66 60  Resp: 18 19 (!) 25 20  Temp:      TempSrc:      SpO2: 98% 99% 97% 98%  Weight:      Height:      PainSc:        Isolation Precautions No active isolations  Medications Medications  apixaban (ELIQUIS) tablet 5 mg (5 mg Oral Given 08/17/19 1114)  acetaminophen (TYLENOL) tablet 650 mg (650 mg Oral Given 08/16/19 2304)  ondansetron (ZOFRAN) injection 4 mg (has no administration in time range)  abiraterone acetate (ZYTIGA) tablet 1,000 mg (1,000 mg Oral Given 08/17/19 1116)  tamsulosin (FLOMAX) capsule 0.4 mg (0.4 mg Oral Given 08/17/19 1113)  carbidopa-levodopa (SINEMET IR) 25-100 MG per tablet immediate release 2 tablet (2 tablets Oral Given 08/17/19 1539)  clonazePAM (KLONOPIN) tablet 0.5 mg (0.5 mg Oral Given 08/17/19 0056)  carbidopa-levodopa (SINEMET IR) 25-100 MG per tablet immediate release 1 tablet (has no administration in time range)  diltiazem (CARDIZEM) tablet 30 mg (30 mg Oral Given 08/17/19 1814)  predniSONE (DELTASONE) tablet 10 mg (has no administration in time range)  adenosine (ADENOCARD) 6 MG/2ML injection 6 mg ( Intravenous Not Given 08/16/19 1559)  diltiazem (CARDIZEM) injection 25 mg (25 mg Intravenous Given 08/16/19 1531)  diltiazem (CARDIZEM CD) 24 hr capsule 240 mg (240 mg Oral Given 08/16/19 1748)    Mobility walks with person assist Moderate fall risk   Focused Assessments Cardiac Assessment Handoff:  Cardiac Rhythm: Supraventricular tachycardia(HR 148) No results found for: CKTOTAL, CKMB, CKMBINDEX, TROPONINI No results found for: DDIMER Does the Patient currently have chest pain? No      R Recommendations: See Admitting Provider Note  Report given to:   Additional Notes:

## 2019-08-17 NOTE — ED Notes (Signed)
Admit MD at bedside

## 2019-08-17 NOTE — ED Notes (Signed)
Pt given graham crackers and water.

## 2019-08-17 NOTE — Progress Notes (Signed)
*  PRELIMINARY RESULTS* Echocardiogram 2D Echocardiogram has been performed.  [name] [name] 08/17/2019, 1:47 PM

## 2019-08-17 NOTE — Progress Notes (Signed)
Nurse reports IV diltiazem discontinued with slow rate earlier in shift/day.  Atrial flutter remains, rate now 76. With stable BP./  Will start short acting cardizem until dose required to control rate can be determines and transisitoned to long acting. Noted patietn is anticoagulated on eliquis

## 2019-08-17 NOTE — H&P (Signed)
History and Physical    [name] [name] [id number] DOB: Nov 04, 1940 DOA: 08/16/2019  PCP: Juline Patch, MD (Confirm with patient/family/NH records and if not entered, this has to be entered at [facility] point of entry) Patient coming from: home  I have personally briefly reviewed patient's old medical records in [place]  Chief Complaint: palpitations  HPI: [name] [name] is a 78 y.o. 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 Reports that he has had intermittent heart palpitations, up to 4 episodes in the past week with the longest one lasting an hour and a half, however this morning he awoke in the palpitations. He states that he has remittent chest discomfort.  Denies nausea vomiting or diaphoresis denies shortness of breath.  Denies headache or blurred vision.  Care provider sent him to cardiologist Dr. Saralyn Pilar who subsequently sent him to the emergency room for arrival.  On arrival to the emergency room his heart rate is 140s.  He got a 6 mg dose of adenosine with only transient improvement and subsequently got to IV diltiazem boluses due to only transient improvement he was subsequently placed on an IV infusion.  EKG showed rapid a flutter.  Blood work significant for elevated troponin of first set 230 and second set 330s.  Emergency room provider discussed with cardiologist Dr. Lorinda Creed, who will see patient .  Review of Systems: As per HPI otherwise 10 point review of systems negative.   Past Medical History:  Diagnosis Date  . Parkinson's disease ([place])   . REM sleep behavior  disorder   . Sleep apnea     Past Surgical History:  Procedure Laterality Date  . APPENDECTOMY    . 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     reports that he quit smoking about 8 years ago. His smoking use included cigarettes and cigars. He has never used smokeless tobacco. He reports current alcohol use. He reports that he does not use drugs.  Allergies  Allergen Reactions  . Influenza Vaccines Anaphylaxis    Flu shot: 1974 anaphylaxis. 2nd time swollen arm Flu shot: 1974 anaphylaxis. 2nd time swollen arm     Family History  Problem Relation Age of Onset  . Heart disease Maternal Grandfather   . Diabetes Paternal Grandfather   . Breast cancer Daughter     Unacceptable: Noncontributory, unremarkable, or negative. Acceptable: (example)Family history negative for heart disease  Prior to Admission medications   Medication Sig Start Date End Date Taking? Authorizing Provider  abiraterone acetate (ZYTIGA) 250 MG tablet Take 4 tablets (1,000 mg total) by mouth daily. Take on an empty stomach 1 hour before or 2 hours after a meal 06/19/19  Yes Brahmanday, Elisha Headland, MD  carbidopa-levodopa (SINEMET IR) 25-100 MG tablet Take 2 tablets by mouth 4 (four) times daily. TAKE 2 tablets at 7AM, 2 tablet at 11AM, 2 tablets at 3PM, 1 tablet at [facility] 10/27/17  Yes [provider]  clonazePAM (KLONOPIN) 0.5 MG tablet Take 0.5 mg by mouth at bedtime. Dr Lisabeth Devoid 09/09/18 08/16/19 Yes [provider]  predniSONE (DELTASONE) 10 MG tablet Take 1 tablet (10 mg total) by mouth  daily with breakfast. 06/19/19  Yes Cammie Sickle, MD  tamsulosin (FLOMAX) 0.4 MG CAPS capsule Take 1 capsule (0.4 mg total) by mouth daily. Patient taking differently: Take 0.4 mg by mouth daily. [place] 04/25/19  Yes Billey Co, MD  Glycerin-Hypromellose-PEG 400 (VISINE DRY EYE OP) Apply to eye. otc    [provider]  ibuprofen (ADVIL,MOTRIN) 600 MG tablet Take by mouth as needed. otc    [provider]  ketoconazole (NIZORAL) 2 % shampoo  02/15/19   [provider]    Physical Exam: Vitals:   08/17/19 0350 08/17/19 0400 08/17/19 0430 08/17/19 0500  BP:  103/76 104/79 104/76  Pulse: (!) 58 76 74 77  Resp: 15 18 17 18   Temp:      TempSrc:      SpO2: 99% 97% 96% 96%  Weight:      Height:        Constitutional: NAD, calm, comfortable Vitals:   08/17/19 0350 08/17/19 0400 08/17/19 0430 08/17/19 0500  BP:  103/76 104/79 104/76  Pulse: (!) 58 76 74 77  Resp: 15 18 17 18   Temp:      TempSrc:      SpO2: 99% 97% 96% 96%  Weight:      Height:       Eyes: PERRL, lids and conjunctivae normal ENMT: Mucous membranes are moist. Posterior pharynx clear of any exudate or lesions.Normal dentition.  Neck: normal, supple, no masses, no thyromegaly Respiratory: clear to auscultation bilaterally, no wheezing, no crackles. Normal respiratory effort. No accessory muscle use.  Cardiovascular: irregularly irregular rhythm Abdomen: no tenderness, no masses palpated. No hepatosplenomegaly. Bowel sounds positive.  Musculoskeletal: no clubbing / cyanosis. No joint deformity upper and lower extremities. Good ROM, no contractures. Normal muscle tone.  Skin: no rashes, lesions, ulcers. No induration Neurologic: CN 2-12 grossly intact. Sensation intact, DTR normal. Strength 5/5 in all 4.  Psychiatric: Normal judgment and insight. Alert and oriented x 3. Normal mood.    Labs on Admission: I have personally reviewed following labs and imaging studies  CBC: Recent Labs  Lab 08/14/19 0953 08/16/19 1505 08/17/19 0445  WBC 9.7 9.0 10.3  NEUTROABS 7.3 7.5  --   HGB 14.5 16.2 14.6  HCT 45.2 48.4 44.4  MCV 95.8 92.0 93.7  PLT 236 265 [id number]   Basic Metabolic Panel: Recent Labs  Lab 08/14/19 0953 08/16/19 1505 08/17/19 0445  NA 140 142 140  K 3.7 4.4 3.6  CL 109 107 108  CO2 23 23 24   GLUCOSE 122* 113* 103*  BUN 19 18 16   CREATININE 1.42* 1.48* 1.30*  CALCIUM 9.7 10.2 9.2   GFR:  Estimated Creatinine Clearance: 47.6 mL/min (A) (by C-G formula based on SCr of 1.3 mg/dL (H)). Liver Function Tests: Recent Labs  Lab 08/14/19 0953 08/16/19 1505  AST 33 41  ALT 8 10  ALKPHOS 58 58  BILITOT 1.8* 2.3*  PROT 6.8 7.1  ALBUMIN 3.9 4.2   No results for input(s): LIPASE, AMYLASE in the last 168 hours. No results for input(s): AMMONIA in the last 168 hours. Coagulation Profile: No results for input(s): INR, PROTIME in the last 168 hours. Cardiac Enzymes: No results for input(s): CKTOTAL, CKMB, CKMBINDEX, TROPONINI in the last 168 hours. BNP (last 3 results) No results for input(s): PROBNP in the last 8760 hours. HbA1C: No results for input(s): HGBA1C in the last 72 hours. CBG: No results for input(s): GLUCAP in the last 168 hours. Lipid Profile: Recent  Labs    08/17/19 0445  CHOL 216*  HDL 72  LDLCALC 124*  TRIG 98  CHOLHDL 3.0   Thyroid Function Tests: Recent Labs    08/16/19 1505  TSH 1.217   Anemia Panel: No results for input(s): VITAMINB12, FOLATE, FERRITIN, TIBC, IRON, RETICCTPCT in the last 72 hours. Urine analysis:    Component Value Date/Time   COLORURINE STRAW (A) 01/07/2019 1541   APPEARANCEUR Clear 03/20/2019 1412   LABSPEC 1.002 (L) 01/07/2019 1541   PHURINE 7.0 01/07/2019 1541   GLUCOSEU Negative 03/20/2019 1412   HGBUR NEGATIVE 01/07/2019 1541   BILIRUBINUR Negative 03/20/2019 1412   KETONESUR NEGATIVE 01/07/2019 1541   PROTEINUR Negative 03/20/2019 1412   PROTEINUR NEGATIVE 01/07/2019 1541   NITRITE Negative 03/20/2019 1412   NITRITE NEGATIVE 01/07/2019 1541   LEUKOCYTESUR Negative 03/20/2019 1412   LEUKOCYTESUR NEGATIVE 01/07/2019 1541    Radiological Exams on Admission: DG Chest Portable 1 View  Result Date: 08/16/2019 CLINICAL DATA:  Atrial flutter. EXAM: PORTABLE CHEST 1 VIEW COMPARISON:  None. FINDINGS: The heart size and mediastinal contours are within normal limits. Both lungs are clear. No pneumothorax or pleural  effusion is noted. The visualized skeletal structures are unremarkable. IMPRESSION: No active disease. Electronically Signed   By: Marijo Conception M.D.   On: 08/16/2019 15:38    EKG: Independently reviewed.   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

## 2019-08-17 NOTE — ED Notes (Addendum)
Diltiazem infusion stopped due to HR consistently staying in the 70s and last BP 98/76. Admitting MD's request to keep HR between 80-100.

## 2019-08-17 NOTE — ED Notes (Signed)
Patient's home medications delivered to pharmacy by this RN

## 2019-08-18 DIAGNOSIS — C7951 Secondary malignant neoplasm of bone: Secondary | ICD-10-CM

## 2019-08-18 DIAGNOSIS — C61 Malignant neoplasm of prostate: Secondary | ICD-10-CM

## 2019-08-18 DIAGNOSIS — I4892 Unspecified atrial flutter: Principal | ICD-10-CM

## 2019-08-18 MED ORDER — DILTIAZEM HCL ER COATED BEADS 120 MG PO CP24: 120.0000 mg | ORAL_CAPSULE | Freq: Every day | ORAL | 2 refills | Status: DC

## 2019-08-18 MED ORDER — DILTIAZEM HCL ER COATED BEADS 120 MG PO CP24
120.0000 mg | ORAL_CAPSULE | Freq: Every day | ORAL | Status: DC
  Administered 2019-08-18: 120 mg via ORAL
  Filled 2019-08-18: qty 1

## 2019-08-18 MED ORDER — APIXABAN 5 MG PO TABS: 5.0000 mg | ORAL_TABLET | Freq: Two times a day (BID) | ORAL | 1 refills | Status: DC

## 2019-08-18 NOTE — Care Management Important Message (Signed)
Important Message  Patient Details  Name: [name] [name] MRN: [id number] Date of Birth: 01/30/1941   Medicare Important Message Given:  Yes     Dannette Barbara 08/18/2019, 11:31 AM

## 2019-08-18 NOTE — Discharge Instructions (Signed)
Atrial Flutter    Atrial flutter is a type of abnormal heart rhythm (arrhythmia). The heart has an electrical system that tells the heart how to beat. In atrial flutter, the signals move rapidly in the top chambers of the heart (the atria). This makes your heart beat very fast. Atrial flutter can come and go, or it can be permanent.  If this condition is not treated it can cause serious complications, such as stroke or weakened heart muscle (cardiomyopathy).  What are the causes?  This condition may be caused by:   A heart condition or problem, such as:  ? A heart attack.  ? Heart failure.  ? A heart valve problem.  ? Heart surgery.   A lung problem, such as:  ? A blood clot in the lungs (pulmonary embolism, or PE).  ? Chronic obstructive pulmonary disease.   Poorly controlled high blood pressure (hypertension).   Overactive thyroid (hyperthyroidism).   Caffeine.   Some decongestant cold medicines.   Low levels of minerals called electrolytes in the blood.   Cocaine.  What increases the risk?  You are more likely to develop this condition if:   You are an elderly adult.   You are a man.   You are obese.   You have obstructive sleep apnea.   You have a family history of atrial flutter.   You have diabetes.  What are the signs or symptoms?  Symptoms of this condition include:   A feeling that your heart is pounding or racing (palpitations).   Shortness of breath.   Chest pain.   Feeling light-headed.   Dizziness.   Fainting.   Low blood pressure (hypotension).   Fatigue.  Sometimes there are no symptoms associated with arrhythmia.  How is this diagnosed?  This condition may be diagnosed based on:   An electrocardiogram (ECG). This is a test that records the electrical signals in the heart.   Ambulatory cardiac monitoring. This is a small recording device that is connected by wires to flat, sticky disks (electrodes) that are attached to your chest.   An echocardiogram. This is a test that uses  sound waves to create pictures of your heart.   A transesophageal echocardiogram (TEE). In this test, a device is placed down your esophagus. This device then uses sounds waves to create even closer pictures of your heart.   Stress test. This test records your heartbeat while you exercise and checks to see if the heart muscle is receiving adequate blood supply.  How is this treated?  This condition may be treated with:   Medicines to:  ? Make your heart beat more slowly.  ? Keep your heart in normal rhythm.  ? Prevent a stroke.   Cardioversion. This uses medicines or an electrical shock to make the heart beat normally.   Ablation. This destroys the heart tissue that is causing the problem.  In some cases, your health care provider will treat other underlying conditions.  Follow these instructions at home:  Medicines   Take over-the-counter and prescription medicines only as told by your health care provider.  ? Make sure you take your medicines exactly as told by your health care provider.  ? Do not miss any doses.   Do not take any new medicines without talking to your health care provider.  Lifestyle   Eat heart-healthy foods. Talk with a dietitian to make an eating plan that is right for you.   Do not use any products that   stress. Stress can make your symptoms worse.  Get screened for sleep apnea. If you have the condition, work with your health care provider to find a treatment that works for you.  Do not use drugs.  Avoid excessive caffeine. General instructions  Lose weight if your health care provider tells you to do that.  Keep all follow-up visits as told by your health  care provider. This is important. Contact a health care provider if:  Your symptoms get worse.  You notice that your palpitations are increasing. Get help right away if:  You have any symptoms of a stroke. "BE FAST" is an easy way to remember the main warning signs of a stroke: ? B - Balance. Signs are dizziness, sudden trouble walking, or loss of balance. ? E - Eyes. Signs are trouble seeing or a sudden change in vision. ? F - Face. Signs are sudden weakness or numbness of the face, or the face or eyelid drooping on one side. ? A - Arms. Signs are weakness or numbness in an arm. This happens suddenly and usually on one side of the body. ? S - Speech. Signs are sudden trouble speaking, slurred speech, or trouble understanding what people say. ? T - Time. Time to call emergency services. Write down what time symptoms started.  You have other signs of a stroke, such as: ? A sudden, severe headache with no known cause. ? Nausea or vomiting. ? Seizure.  You have additional symptoms, such as: ? Fainting. ? Shortness of breath. ? Pain or pressure in your chest. ? Suddenly feeling nauseous or suddenly vomiting. ? Increased sweating with no known cause.  These symptoms may represent a serious problem that is an emergency. Do not wait to see if the symptoms will go away. Get medical help right away. Call your local emergency services (911 in the [place] Do not drive yourself to the hospital. Summary  Atrial flutter is an abnormal heart rhythm that can give you symptoms of palpitations, shortness of breath, or fatigue.  Atrial flutter is often treated with medicines to keep your heart in a normal rhythm and to prevent a stroke.  You should seek immediate help if you cannot catch your breath, have chest pain or pressure, or have weakness, especially on one side of your body. This information is not intended to replace advice given to you by your health care provider. Make sure you discuss any  questions you have with your health care provider. Document Released: 01/10/2009 Document Revised: 05/13/2018 Document Reviewed: 05/27/2017 Elsevier Patient Education  2020 [employer].

## 2019-08-18 NOTE — Discharge Summary (Addendum)
Physician Discharge Summary  [name] [name] [id number] DOB: [birth year] DOA: 08/16/2019  PCP: Juline Patch, MD  Admit date: 08/16/2019 Discharge date: 08/18/2019  Admitted From: Home Disposition: Home  Recommendations for Outpatient Follow-up:  Follow-up with PCP in 1 week.  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  On the day of admission he woke up early in the morning with palpitations and feeling weak.  Denies any chest pain, shortness of breath, fevers, chills, nausea, vomiting, abdominal pain, weight loss tremors or recent illness.  His PCP referred him to cardiologist to then sent him to the ED. In the ED his heart rate was in the 140s and in rapid A.  Flutter. Received 6 mg IV hydration with no improvement and then got IV Cardizem bolus followed by infusion.  Blood work showed mildly elevated troponin. Admitted for further management.   Hospital course   Principal Problem:   New onset atrial flutter with RVR ([facility])  Rate controlled with Cardizem infusion initially, transition to oral Cardizem within few hours.  Now in sinus rhythm.  TSH and electrolytes normal. 2D echo shows low normal EF of 50-55% with increased left ventricular septal and posterior wall thickness and grade 1 diastolic dysfunction.  Patient is on  Zytiga for his prostate cancer which has <10% risk of causing arrhythmias.  If he has persistent A. fib no new arrhythmias, continuing Zytiga may need to be addressed by his oncologist.   Patient now asymptomatic and heart rate well controlled in sinus rhythm.  I will discharge him on oral long-acting Cardizem 120 mg daily.  Started him on Eliquis 5 mg twice daily given his CHADS2Vasc of 2.  (Age >75).   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  Elevated troponin No chest pain symptoms.    2D echo with low normal EF and no wall motion abnormality.    Sleep apnea treated with nocturnal BiPAP Stable.    Parkinson's disease ([place]) Stable.  Continue Sinemet    Prostate cancer metastatic to bone [facility]) Outpatient follow-up with his oncologist in [place].     Family Communication  : None  Disposition Plan  : Home    Procedures  : 2D echo  Discharge Instructions  Discharge Instructions    Amb referral to [facility]   Complete by: As directed      Allergies as of 08/18/2019      Reactions   Influenza Vaccines Anaphylaxis   Flu shot: 1974 anaphylaxis. 2nd time swollen arm Flu shot: 1974 anaphylaxis. 2nd time swollen arm      Medication List    TAKE these medications   abiraterone acetate 250 MG tablet Commonly known as: ZYTIGA Take 4 tablets (1,000 mg total) by mouth daily. Take on an empty stomach 1 hour before or 2 hours after a meal   apixaban  5 MG Tabs tablet Commonly known as: ELIQUIS Take 1 tablet (5 mg total) by mouth 2 (two) times daily.   carbidopa-levodopa 25-100 MG tablet Commonly known as: SINEMET IR Take 2 tablets by mouth 4 (four) times daily. TAKE 2 tablets at 7AM, 2 tablet at 11AM, 2 tablets at 3PM, 1 tablet at 7PM   clonazePAM 0.5 MG tablet Commonly known as: KLONOPIN Take 0.5 mg by mouth at bedtime. Dr Lisabeth Devoid   diltiazem 120 MG 24 hr capsule Commonly known as: CARDIZEM  CD Take 1 capsule (120 mg total) by mouth daily.   ibuprofen 600 MG tablet Commonly known as: ADVIL Take by mouth as needed. otc   ketoconazole 2 % shampoo Commonly known as: NIZORAL   predniSONE 10 MG tablet Commonly known as: DELTASONE Take 1 tablet (10 mg total) by mouth daily with breakfast.   tamsulosin 0.4 MG Caps capsule Commonly known as: FLOMAX Take 1 capsule (0.4 mg total) by mouth daily. What changed: additional instructions   VISINE DRY EYE OP Apply to eye. otc      Follow-up Information    Juline Patch, MD Follow up in 1 week(s).   Specialty: Family Medicine Contact information: [address] [place] [address] [phone]        Isaias Cowman, MD Follow up in 2 week(s).   Specialty: Cardiology Why: office will call Contact information: [facility] [address] [phone]          Allergies  Allergen Reactions  . Influenza Vaccines Anaphylaxis    Flu shot: 1974 anaphylaxis. 2nd time swollen arm Flu shot: 1974 anaphylaxis. 2nd time swollen arm      Procedures/Studies: DG Chest Portable 1 View  Result Date: 08/16/2019 CLINICAL DATA:  Atrial flutter. EXAM: PORTABLE CHEST 1 VIEW COMPARISON:  None. FINDINGS: The heart size and mediastinal contours are within normal limits. Both lungs are clear. No pneumothorax or pleural effusion is noted. The visualized skeletal structures are unremarkable. IMPRESSION: No active disease. Electronically Signed   By: Marijo Conception M.D.   On: 08/16/2019 15:38   ECHOCARDIOGRAM COMPLETE  Result Date: 08/17/2019   ECHOCARDIOGRAM REPORT   Patient Name:   [name] [name] Date of Exam: 08/17/2019 Medical Rec #:  [id number]        Height:       69.0 in Accession #:    [id number]       Weight:       158.0 lb Date of Birth:  [birth year]       BSA:          1.87 m Patient Age:    67 years         BP:           98/69 mmHg Patient Gender: M                 HR:           102 bpm. Exam Location:  [facility] Procedure: 2D Echo, Cardiac Doppler and Color Doppler Indications:     Atrial Fibrillation 427.31  History:         Patient has no prior history of Echocardiogram examinations.                  Sleep apnea.  Sonographer:     Sherrie Sport RDCS (AE) Referring Phys:  [id number] [facility] Diagnosing Phys: Bartholome Bill MD IMPRESSIONS  1. Left ventricular ejection fraction, by visual  estimation, is 50 to 55%. The left ventricle has low normal function. Left ventricular septal wall thickness was moderately increased. Moderately increased left ventricular posterior wall thickness. There  is moderately increased left ventricular hypertrophy.  2. Left ventricular diastolic parameters are consistent with Grade I diastolic dysfunction (impaired relaxation).  3. The left ventricle has no regional wall motion abnormalities.  4. Global right ventricle has normal systolic function.The right ventricular size is normal. No increase in right ventricular wall thickness.  5. Left atrial size was normal.  6. Right atrial size was mildly dilated.  7. The mitral valve was not well visualized. Trivial mitral valve regurgitation.  8. The tricuspid valve is not well visualized. Tricuspid valve regurgitation is trivial.  9. The aortic valve was not well visualized. Aortic valve regurgitation is trivial. 10. The pulmonic valve was not well visualized. Pulmonic valve regurgitation is trivial. 11. The aortic root was not well visualized. 12. Normal pulmonary artery systolic pressure. 13. The atrial septum is grossly normal. FINDINGS  Left Ventricle: Left ventricular ejection fraction, by visual estimation, is 50 to 55%. The left ventricle has low normal function. The left ventricle has no regional wall motion abnormalities. Moderately increased left ventricular posterior wall thickness. There is moderately increased left ventricular hypertrophy. Left ventricular diastolic parameters are consistent  with Grade I diastolic dysfunction (impaired relaxation). Right Ventricle: The right ventricular size is normal. No increase in right ventricular wall thickness. Global RV systolic function is has normal systolic function. The tricuspid regurgitant velocity is 2.04 m/s, and with an assumed right atrial pressure  of 10 mmHg, the estimated right ventricular systolic pressure is normal at 26.6 mmHg. Left Atrium: Left atrial size was normal in size. Right Atrium: Right atrial size was mildly dilated Pericardium: There is no evidence of pericardial effusion. Mitral Valve: The mitral valve was not well visualized. Trivial mitral valve regurgitation. Tricuspid Valve: The tricuspid valve is not well visualized. Tricuspid valve regurgitation is trivial. Aortic Valve: The aortic valve was not well visualized. Aortic valve regurgitation is trivial. Aortic valve mean gradient measures 1.5 mmHg. Aortic valve peak gradient measures 2.7 mmHg. Aortic valve area, by VTI measures 3.10 cm. Pulmonic Valve: The pulmonic valve was not well visualized. Pulmonic valve regurgitation is trivial. Pulmonic regurgitation is trivial. Aorta: The aortic root was not well visualized. IAS/Shunts: The atrial septum is grossly normal.  LEFT VENTRICLE PLAX 2D LVIDd:         3.94 cm LVIDs:         2.94 cm LV PW:         1.40 cm LV IVS:        1.19 cm LVOT diam:     2.10 cm LV SV:         34 ml LV SV Index:   18.28 LVOT Area:     3.46 cm  RIGHT VENTRICLE RV Basal diam:  3.79 cm LEFT ATRIUM             Index       RIGHT ATRIUM           Index LA diam:        3.70 cm 1.98 cm/m  RA Area:     18.90 cm LA Vol (A2C):   59.8 ml 31.99 ml/m RA Volume:   48.50 ml  25.95 ml/m LA Vol (A4C):   42.4 ml 22.69 ml/m LA Biplane Vol: 49.8 ml 26.64 ml/m  AORTIC VALVE AV Area (Vmax):    2.82 cm AV  Area (Vmean):   2.76 cm AV Area (VTI):     3.10 cm AV Vmax:           82.50 cm/s AV Vmean:          52.450 cm/s AV VTI:            0.096 m AV Peak Grad:      2.7 mmHg AV  Mean Grad:      1.5 mmHg LVOT Vmax:         67.20 cm/s LVOT Vmean:        41.800 cm/s LVOT VTI:          0.086 m LVOT/AV VTI ratio: 0.90  AORTA Ao Root diam: 3.50 cm MITRAL VALVE                       TRICUSPID VALVE MV Area (PHT): 3.21 cm            TR Peak grad:   16.6 mmHg MV PHT:        68.44 msec          TR Vmax:        204.00 cm/s MV Decel Time: 236 msec MV E velocity: 53.80 cm/s 103 cm/s SHUNTS                                    Systemic VTI:  0.09 m                                    Systemic Diam: 2.10 cm  Bartholome Bill MD Electronically signed by Bartholome Bill MD Signature Date/Time: 08/17/2019/4:13:35 PM    Final        Subjective: Denies further palpitations.  No overnight events.  Heart rate stable on the monitor.  Discharge Exam: Vitals:   08/18/19 0605 08/18/19 0906  BP:  101/61  Pulse: 72 69  Resp:  18  Temp:  97.9 F (36.6 C)  SpO2:  98%   Vitals:   08/17/19 2005 08/18/19 0509 08/18/19 0605 08/18/19 0906  BP: 112/68 118/80  101/61  Pulse: (!) 58 (!) 51 72 69  Resp:  18  18  Temp: 98.3 F (36.8 C) 97.6 F (36.4 C)  97.9 F (36.6 C)  TempSrc: Oral Oral  Oral  SpO2:  100%  98%  Weight: 69.4 kg 69.1 kg    Height: 5\' 9"  (1.753 m)       General: Elderly male not in distress HEENT: Moist mucosa, supple neck Chest: Clear bilaterally CVs: Normal S1-S2, no murmurs GI: Soft, nondistended, nontender Musculoskeletal: Warm, no edema    The results of significant diagnostics from this hospitalization (including imaging, microbiology, ancillary and laboratory) are listed below for reference.     Microbiology: Recent Results (from the past 240 hour(s))  SARS CORONAVIRUS 2 (TAT 6-24 HRS) Nasopharyngeal Nasopharyngeal Swab     Status: None   Collection Time: 08/16/19  7:35 PM   Specimen: Nasopharyngeal Swab  Result Value Ref Range Status   SARS Coronavirus 2 NEGATIVE NEGATIVE Final    Comment: (NOTE) SARS-CoV-2 target nucleic acids are NOT DETECTED. The SARS-CoV-2  RNA is generally detectable in upper and lower respiratory specimens during the acute phase of infection. Negative results do not preclude SARS-CoV-2 infection, do not rule out co-infections with other pathogens,  and should not be used as the sole basis for treatment or other patient management decisions. Negative results must be combined with clinical observations, patient history, and epidemiological information. The expected result is Negative. Fact Sheet for Patients: SugarRoll.be Fact Sheet for Healthcare Providers: https://www.woods-mathews.com/ This test is not yet approved or cleared by the [place] FDA and  has been authorized for detection and/or diagnosis of SARS-CoV-2 by FDA under an Emergency Use Authorization (EUA). This EUA will remain  in effect (meaning this test can be used) for the duration of the COVID-19 declaration under Section 56 4(b)(1) of the Act, 21 U.S.C. section 360bbb-3(b)(1), unless the authorization is terminated or revoked sooner. Performed at [facility], [place] [address], [place], [place] [address]      Labs: BNP (last 3 results) No results for input(s): BNP in the last 8760 hours. Basic Metabolic Panel: Recent Labs  Lab 08/14/19 0953 08/16/19 1505 08/17/19 0445  NA 140 142 140  K 3.7 4.4 3.6  CL 109 107 108  CO2 23 23 24   GLUCOSE 122* 113* 103*  BUN 19 18 16   CREATININE 1.42* 1.48* 1.30*  CALCIUM 9.7 10.2 9.2   Liver Function Tests: Recent Labs  Lab 08/14/19 0953 08/16/19 1505  AST 33 41  ALT 8 10  ALKPHOS 58 58  BILITOT 1.8* 2.3*  PROT 6.8 7.1  ALBUMIN 3.9 4.2   No results for input(s): LIPASE, AMYLASE in the last 168 hours. No results for input(s): AMMONIA in the last 168 hours. CBC: Recent Labs  Lab 08/14/19 0953 08/16/19 1505 08/17/19 0445  WBC 9.7 9.0 10.3  NEUTROABS 7.3 7.5  --   HGB 14.5 16.2 14.6  HCT 45.2 48.4 44.4  MCV 95.8 92.0 93.7  PLT 236 265 223    Cardiac Enzymes: No results for input(s): CKTOTAL, CKMB, CKMBINDEX, TROPONINI in the last 168 hours. BNP: Invalid input(s): POCBNP CBG: No results for input(s): GLUCAP in the last 168 hours. D-Dimer No results for input(s): DDIMER in the last 72 hours. Hgb A1c No results for input(s): HGBA1C in the last 72 hours. Lipid Profile Recent Labs    08/17/19 0445  CHOL 216*  HDL 72  LDLCALC 124*  TRIG 98  CHOLHDL 3.0   Thyroid function studies Recent Labs    08/16/19 1505  TSH 1.217   Anemia work up No results for input(s): VITAMINB12, FOLATE, FERRITIN, TIBC, IRON, RETICCTPCT in the last 72 hours. Urinalysis    Component Value Date/Time   COLORURINE STRAW (A) 01/07/2019 1541   APPEARANCEUR Clear 03/20/2019 1412   LABSPEC 1.002 (L) 01/07/2019 1541   PHURINE 7.0 01/07/2019 1541   GLUCOSEU Negative 03/20/2019 1412   HGBUR NEGATIVE 01/07/2019 1541   BILIRUBINUR Negative 03/20/2019 1412   KETONESUR NEGATIVE 01/07/2019 1541   PROTEINUR Negative 03/20/2019 1412   PROTEINUR NEGATIVE 01/07/2019 1541   NITRITE Negative 03/20/2019 1412   NITRITE NEGATIVE 01/07/2019 1541   LEUKOCYTESUR Negative 03/20/2019 1412   LEUKOCYTESUR NEGATIVE 01/07/2019 1541   Sepsis Labs Invalid input(s): PROCALCITONIN,  WBC,  LACTICIDVEN Microbiology Recent Results (from the past 240 hour(s))  SARS CORONAVIRUS 2 (TAT 6-24 HRS) Nasopharyngeal Nasopharyngeal Swab     Status: None   Collection Time: 08/16/19  7:35 PM   Specimen: Nasopharyngeal Swab  Result Value Ref Range Status   SARS Coronavirus 2 NEGATIVE NEGATIVE Final    Comment: (NOTE) SARS-CoV-2 target nucleic acids are NOT DETECTED. The SARS-CoV-2 RNA is generally detectable in upper and lower respiratory specimens during  the acute phase of infection. Negative results do not preclude SARS-CoV-2 infection, do not rule out co-infections with other pathogens, and should not be used as the sole basis for treatment or other patient management  decisions. Negative results must be combined with clinical observations, patient history, and epidemiological information. The expected result is Negative. Fact Sheet for Patients: SugarRoll.be Fact Sheet for Healthcare Providers: https://www.woods-mathews.com/ This test is not yet approved or cleared by the [place] FDA and  has been authorized for detection and/or diagnosis of SARS-CoV-2 by FDA under an Emergency Use Authorization (EUA). This EUA will remain  in effect (meaning this test can be used) for the duration of the COVID-19 declaration under Section 56 4(b)(1) of the Act, 21 U.S.C. section 360bbb-3(b)(1), unless the authorization is terminated or revoked sooner. Performed at [facility], [place] [address] [place], [place] [address]      Time coordinating discharge: 35 minutes  SIGNED:   Louellen Molder, MD  [facility] 08/18/2019, 11:12 AM Pager   If 7PM-7AM, please contact night-coverage [email or web address] Password [email or web address]

## 2019-08-21 ENCOUNTER — Telehealth: Payer: Self-pay | Admitting: Internal Medicine

## 2019-08-21 ENCOUNTER — Telehealth: Payer: Self-pay

## 2019-08-21 ENCOUNTER — Telehealth: Payer: Self-pay | Admitting: *Deleted

## 2019-08-21 NOTE — Telephone Encounter (Signed)
Transition Care Management Follow-up Telephone Call  Date of discharge and from where: [facility] on 08/18/19   How have you been since you were released from the hospital? Feeling much better and has been monitoring b/p and pulse often. His pulse elevates with activity to around 90 but it is better than when first d/c home. B/p has been good and steady since d/c. Declines fever, pain, dizziness, SOB or n/v/d.  Any questions or concerns? No   Items Reviewed:  Did the pt receive and understand the discharge instructions provided? Yes   Medications obtained and verified? Yes   Any new allergies since your discharge? No   Dietary orders reviewed? Yes  Do you have support at home? Yes   Other (ie: DME, Home Health, etc) N/A  Functional Questionnaire: (I = Independent and D = Dependent)  Bathing/Dressing- I   Meal Prep- I  Eating- I  Maintaining continence- I  Transferring/Ambulation- I  Managing Meds- I   Follow up appointments reviewed:    PCP Hospital f/u appt confirmed? No , pt to call the office to schedule virtual visit.  [facility] f/u appt confirmed? Yes    Are transportation arrangements needed? No   If their condition worsens, is the pt aware to call  their PCP or go to the ED? Yes  Was the patient provided with contact information for the PCP's office or ED? Yes  Was the pt encouraged to call back with questions or concerns? Yes

## 2019-08-21 NOTE — Telephone Encounter (Signed)
Spoke to patient regarding his recent admission to hospital for A. Fib.  A. fib not a common adverse event with Zytiga [cardiac erythremia up to 7%].  Patient awaiting follow-up with cardiology Dr. Stephani Police. Follow up as planned; however if a.fib reoccurs patient will call us back; then we can stop Zytiga.

## 2019-08-21 NOTE — Telephone Encounter (Addendum)
Patient called reporting that one side effect of Zytiga is increased heart rate and he is asking if his dose needs to be adjusted as he was hospitalized last week with Afib. please advise

## 2019-08-22 NOTE — Telephone Encounter (Signed)
Dr. B spoke with patient.

## 2019-08-24 ENCOUNTER — Ambulatory Visit (INDEPENDENT_AMBULATORY_CARE_PROVIDER_SITE_OTHER): Payer: Medicare Other | Admitting: Family Medicine

## 2019-08-24 ENCOUNTER — Encounter: Payer: Self-pay | Admitting: Family Medicine

## 2019-08-24 ENCOUNTER — Other Ambulatory Visit: Payer: Self-pay

## 2019-08-24 VITALS — BP 110/60 | HR 80 | Ht 69.0 in | Wt 159.0 lb

## 2019-08-24 DIAGNOSIS — Z09 Encounter for follow-up examination after completed treatment for conditions other than malignant neoplasm: Secondary | ICD-10-CM | POA: Diagnosis not present

## 2019-08-24 DIAGNOSIS — I4892 Unspecified atrial flutter: Secondary | ICD-10-CM

## 2019-08-24 DIAGNOSIS — R0789 Other chest pain: Secondary | ICD-10-CM

## 2019-08-24 NOTE — Progress Notes (Signed)
Date:  08/24/2019   Name:  [name] [name]   DOB:  15-Jul-1941   MRN:  [id number]   Chief Complaint: Follow-up (hospital admitted on 12/09 and d/c on 08/18/2019. Atrial Flutter)  Pt was recently admitted to [facility] for arial flutter with tachcardia on 08/16/19 and was discharged on 08/18/19. Transition of care call placed on 08/21/19.   Palpitations  This is a new problem. The current episode started 1 to 4 weeks ago. The problem occurs intermittently. The problem has been gradually improving. Associated symptoms include chest fullness. Pertinent negatives include no anxiety, chest pain, coughing, diaphoresis, dizziness, fever, irregular heartbeat, malaise/fatigue, nausea, near-syncope, numbness, shortness of breath, syncope, vomiting or weakness. Treatments tried: adenosine/calcium channel. The treatment provided significant relief.    Lab Results  Component Value Date   CREATININE 1.30 (H) 08/17/2019   BUN 16 08/17/2019   NA 140 08/17/2019   K 3.6 08/17/2019   CL 108 08/17/2019   CO2 24 08/17/2019   Lab Results  Component Value Date   CHOL 216 (H) 08/17/2019   HDL 72 08/17/2019   LDLCALC 124 (H) 08/17/2019   TRIG 98 08/17/2019   CHOLHDL 3.0 08/17/2019   Lab Results  Component Value Date   TSH 1.217 08/16/2019   No results found for: HGBA1C   Review of Systems  Constitutional: Negative for chills, diaphoresis, fever and malaise/fatigue.  HENT: Negative for drooling, ear discharge, ear pain, postnasal drip, rhinorrhea, sinus pressure, sinus pain and sore throat.   Respiratory: Negative for apnea, cough, chest tightness, shortness of breath and wheezing.   Cardiovascular: Positive for palpitations. Negative for chest pain, leg swelling, syncope and near-syncope.  Gastrointestinal: Negative for abdominal pain, blood in stool, constipation, diarrhea, nausea and vomiting.  Endocrine: Negative for polydipsia.  Genitourinary: Negative for dysuria, frequency,  hematuria and urgency.  Musculoskeletal: Negative for back pain, myalgias and neck pain.  Skin: Negative for rash.  Allergic/Immunologic: Negative for environmental allergies.  Neurological: Negative for dizziness, weakness, numbness and headaches.  Hematological: Does not bruise/bleed easily.  Psychiatric/Behavioral: Negative for suicidal ideas. The patient is not nervous/anxious.     Patient Active Problem List   Diagnosis Date Noted  . Atrial flutter with rapid ventricular response ([place]) 08/18/2019  . New onset atrial flutter ([place]) 08/16/2019  . Goals of care, counseling/discussion 07/17/2019  . Prostate cancer metastatic to bone ([place]) 05/24/2019  . Establishing care with new doctor, encounter for 09/26/2018  . 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 Parkinson's disease ([place]) 11/07/2015  . 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 Hx of colonic polyps 05/18/2014  . BPH (benign prostatic hyperplasia) 01/12/2014  . Sleep apnea treated with nocturnal BiPAP 01/17/2012  . 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 Influenza Vaccines Anaphylaxis    Flu shot: 1974 anaphylaxis. 2nd time swollen arm Flu shot: 1974 anaphylaxis. 2nd time swollen arm     Past Surgical History:  Procedure Laterality Date  . APPENDECTOMY    . 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 Smoking status: Former Smoker    Types: Cigarettes, Cigars    Quit date: 09/07/2010    Years since quitting: 8.9  . Smokeless tobacco: Never Used  Substance Use Topics  . Alcohol use: Yes    Comment: 1 can beer/day  . Drug use: Never     Medication list has been reviewed and updated.  Current Meds  Medication Sig  . abiraterone acetate (ZYTIGA) 250 MG tablet Take 4 tablets (1,000 mg total) by mouth daily. Take on an empty stomach 1 hour before or 2 hours after a meal  . apixaban (ELIQUIS) 5 MG TABS tablet Take 1 tablet (5 mg total) by mouth 2  (two) times daily.  . carbidopa-levodopa (SINEMET IR) 25-100 MG tablet Take 2 tablets by mouth 4 (four) times daily. TAKE 2 tablets at 7AM, 2 tablet at 11AM, 2 tablets at 3PM, 1 tablet at 7PM  . clonazePAM (KLONOPIN) 0.5 MG tablet Take 0.5 mg by mouth at bedtime. Dr Lisabeth Devoid  . diltiazem (CARDIZEM CD) 120 MG 24 hr capsule Take 1 capsule (120 mg total) by mouth daily.  . Glycerin-Hypromellose-PEG 400 (VISINE DRY EYE OP) Apply to eye. otc  . ketoconazole (NIZORAL) 2 % shampoo Apply 1 application topically 2 (two) times a week.   . predniSONE (DELTASONE) 10 MG tablet Take 1 tablet (10 mg total) by mouth daily with breakfast.  . tamsulosin (FLOMAX) 0.4 MG CAPS capsule Take 1 capsule (0.4 mg total) by mouth daily. (Patient taking differently: Take 0.4 mg by mouth daily. [place])    PHQ 2/9 Scores 08/16/2019 03/16/2019 01/11/2019 09/26/2018  PHQ - 2 Score 0 0 0 0  PHQ- 9 Score 0 0 0 0    BP Readings from Last 3 Encounters:  08/24/19 110/60  08/18/19 101/61  08/16/19 (!) 108/91    Physical Exam Vitals and nursing note reviewed.  HENT:     Head: Normocephalic.     Right Ear: Tympanic membrane, ear canal and external ear normal.     Left Ear: Tympanic membrane, ear canal and external ear normal.     Nose: Nose normal.  Eyes:     General: No scleral icterus.       Right eye: No discharge.        Left eye: No discharge.     Conjunctiva/sclera: Conjunctivae normal.     Pupils: Pupils are equal, round, and reactive to light.  Neck:     Thyroid: No thyromegaly.     Vascular: No JVD.     Trachea: No tracheal deviation.  Cardiovascular:     Rate and Rhythm: Normal rate and regular rhythm.     Heart sounds: Normal heart sounds. No murmur. No friction rub. No gallop.   Pulmonary:     Effort: No respiratory distress.     Breath sounds: Normal breath sounds. No wheezing or rales.  Abdominal:     General: Bowel sounds are normal.     Palpations: Abdomen is soft. There is no mass.     Tenderness:  There is no abdominal tenderness. There is no guarding or rebound.  Musculoskeletal:        General: No tenderness. Normal range of motion.     Cervical back: Normal range of motion and neck supple.  Lymphadenopathy:     Cervical: No cervical adenopathy.  Skin:    General: Skin is warm.     Findings: No rash.  Neurological:     Mental Status: He is alert and oriented to person, place, and time.     Cranial Nerves: No cranial nerve deficit.     Deep Tendon Reflexes: Reflexes are normal and symmetric.     Wt Readings from Last 3 Encounters:  08/24/19 159 lb (72.1 kg)  08/18/19 152 lb 6.4 oz (69.1 kg)  08/16/19 158 lb (71.7 kg)    BP 110/60   Pulse 80  Ht 5\' 9"  (1.753 m)   Wt 159 lb (72.1 kg)   BMI 23.48 kg/m     Assessment and Plan:  [facility] Discharge Acute Issues Care Follow Up                                                                        Patient Demographics  [name] [name], is a 78 y.o. male  DOB 1940-12-29  MRN [id number].  Primary MD  Juline Patch, MD   Reason for TCC follow Up - Hosp discharge followup/recheck atrial flutter/tachycardia   Past Medical History:  Diagnosis Date  . Parkinson's disease ([place])   . REM sleep behavior disorder   . Sleep apnea     Past Surgical History:  Procedure Laterality Date  . APPENDECTOMY    . 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       Recent HPI and Hospital Course Patient is doing very well status post discharge from the hospital and is currently on rate control with diltiazem and Eliquis for prophylaxis of atrial flutter fib.   [facility] Acute Care Issue to be followed in the Clinic   We also discussed that the patient was having some discomfort at the time of his tachyarrhythmia and that this needs to be addressed with the cardiologist on the when he 2 December with Dr. Tyler Aas  shows   Subjective:   Princess Bruins today has, No headache, No chest pain, No abdominal pain - No Nausea, No new weakness tingling or numbness, No Cough - SOB.  In retrospect when the patient was here he was having some tightness in the chest when his heart rate was 169 which I have encouraged him to bring to the attention of the cardiologist if further evaluation for coronary artery disease needs to be investigated.  Assessment & Plan    1. Hospital discharge follow-up Patient is status post discharge for atrial flutter with rapid ventricular response which did not respond to emergency room endeavors and required hospitalization for diltiazem drip and further evaluation to prepare for anticoagulation.  2. Atrial flutter with rapid ventricular response (HCC) Acute.  Controlled.  Stable.  Patient is on diltiazem CD 120 mg for rate control.  Patient is on Eliquis 5 mg twice a day for prophylaxis.  For oncology he is continuing Zytiga 250 mg and this is followed by Dr. Yevette Edwards.  3. Chest discomfort Patient had chest discomfort when he was initially evaluated by me when his heart rate was 169 this is resolved since he was able to control the rate.  We will suggest that he mentioned this to the cardiologist to determine if any further evaluation needs to be done.    Reason for frequent admissions/ER visits not applicable      Objective:   Vitals:   08/24/19 1400  BP: 110/60  Pulse: 80  Weight: 159 lb (72.1 kg)  Height: 5\' 9"  (1.753 m)    Wt Readings from Last 3 Encounters:  08/24/19 159 lb (72.1 kg)  08/18/19 152 lb 6.4 oz (69.1 kg)  08/16/19 158 lb (71.7 kg)    Allergies as of 08/24/2019      Reactions   Influenza Vaccines Anaphylaxis   Flu shot: 1974 anaphylaxis. 2nd time swollen arm Flu shot: 1974 anaphylaxis. 2nd time swollen arm      Medication List       Accurate as of August 24, 2019  2:29 PM. If you have any questions, ask your nurse or doctor.         abiraterone acetate 250 MG tablet Commonly known as: ZYTIGA Take 4 tablets (1,000 mg total) by mouth daily. Take on an empty stomach 1 hour before or 2 hours after a meal   apixaban 5 MG Tabs tablet Commonly known as: ELIQUIS Take 1 tablet (5 mg total) by mouth 2 (two) times daily.   carbidopa-levodopa 25-100 MG tablet Commonly known as: SINEMET IR Take 2 tablets by mouth 4 (four) times daily. TAKE 2 tablets at 7AM, 2 tablet at 11AM, 2 tablets at 3PM, 1 tablet at 7PM   clonazePAM 0.5 MG tablet Commonly known as: KLONOPIN Take 0.5 mg by mouth at bedtime. Dr Lisabeth Devoid   diltiazem 120 MG 24 hr capsule Commonly known as: CARDIZEM CD Take 1 capsule (120 mg total) by mouth daily.   ibuprofen 600 MG tablet Commonly known as: ADVIL Take by mouth as needed. otc   ketoconazole 2 % shampoo Commonly known as: NIZORAL Apply 1 application topically 2 (two) times a week.   predniSONE 10 MG tablet Commonly known as: DELTASONE Take 1 tablet (10 mg total) by mouth daily with breakfast.   tamsulosin 0.4 MG Caps capsule Commonly known as: FLOMAX Take 1 capsule (0.4 mg total) by mouth daily. What changed: additional instructions   VISINE DRY EYE OP Apply to eye. otc        Physical Exam: Constitutional: Patient appears well-developed and well-nourished. Not in obvious distress. HENT: Normocephalic, atraumatic, External right and left ear normal. Oropharynx is clear and moist.  Eyes: Conjunctivae and EOM are normal. PERRLA, no scleral icterus. Neck: Normal ROM. Neck supple. No JVD. No tracheal deviation. No thyromegaly. CVS: RRR, S1/S2 +, no murmurs, no gallops, no carotid bruit.  Pulmonary: Effort and breath sounds normal, no stridor, rhonchi, wheezes, rales.  Abdominal: Soft. BS +, no distension, tenderness, rebound or guarding.  Musculoskeletal: Normal range of motion. No edema and no tenderness.  Lymphadenopathy: No lymphadenopathy noted, cervical, inguinal or axillary Neuro:  Alert. Normal reflexes, muscle tone coordination. No cranial nerve deficit. Skin: Skin is warm and dry. No rash noted. Not diaphoretic. No erythema. No pallor. Psychiatric: Normal mood and affect. Behavior, judgment, thought content normal.   Data Review   Micro Results Recent Results (from the past 240 hour(s))  SARS CORONAVIRUS 2 (TAT 6-24 HRS) Nasopharyngeal Nasopharyngeal Swab     Status: None   Collection Time: 08/16/19  7:35 PM   Specimen: Nasopharyngeal Swab  Result Value Ref Range Status   SARS Coronavirus 2 NEGATIVE NEGATIVE Final    Comment: (NOTE) SARS-CoV-2 target nucleic acids are NOT DETECTED. The SARS-CoV-2 RNA is generally detectable in upper and lower respiratory specimens during the acute phase of infection. Negative results do not preclude SARS-CoV-2 infection, do not rule out co-infections with other pathogens, and should not be used as the sole basis for treatment or other patient management decisions. Negative results must be combined with clinical observations, patient history, and epidemiological information. The expected result  is Negative. Fact Sheet for Patients: SugarRoll.be Fact Sheet for Healthcare Providers: https://www.woods-mathews.com/ This test is not yet approved or cleared by the [place] FDA and  has been authorized for detection and/or diagnosis of SARS-CoV-2 by FDA under an Emergency Use Authorization (EUA). This EUA will remain  in effect (meaning this test can be used) for the duration of the COVID-19 declaration under Section 56 4(b)(1) of the Act, 21 U.S.C. section 360bbb-3(b)(1), unless the authorization is terminated or revoked sooner. Performed at [facility], [place] [address] [place], [place] [address]   Labs were reviewed upon discharge and there is no follow-up labs or abnormality necessitated.   CBC No results for input(s): WBC, HGB, HCT, PLT, MCV, MCH, MCHC, RDW, LYMPHSABS,  MONOABS, EOSABS, BASOSABS, BANDABS in the last 168 hours.  Invalid input(s): NEUTRABS, BANDSABD  Chemistries  No results for input(s): NA, K, CL, CO2, GLUCOSE, BUN, CREATININE, CALCIUM, MG, AST, ALT, ALKPHOS, BILITOT in the last 168 hours.  Invalid input(s): GFRCGP/will follow gfr ------------------------------------------------------------------------------------------------------------------ estimated creatinine clearance is 47.6 mL/min (A) (by C-G formula based on SCr of 1.3 mg/dL (H)). ------------------------------------------------------------------------------------------------------------------ No results for input(s): HGBA1C in the last 72 hours. ------------------------------------------------------------------------------------------------------------------ No results for input(s): CHOL, HDL, LDLCALC, TRIG, CHOLHDL, LDLDIRECT in the last 72 hours. ------------------------------------------------------------------------------------------------------------------ No results for input(s): TSH, T4TOTAL, T3FREE, THYROIDAB in the last 72 hours.  Invalid input(s): FREET3 ------------------------------------------------------------------------------------------------------------------ No results for input(s): VITAMINB12, FOLATE, FERRITIN, TIBC, IRON, RETICCTPCT in the last 72 hours.  Coagulation profile No results for input(s): INR, PROTIME in the last 168 hours.  No results for input(s): DDIMER in the last 72 hours.  Cardiac Enzymes No results for input(s): CKMB, TROPONINI, MYOGLOBIN in the last 168 hours.  Invalid input(s): CK ------------------------------------------------------------------------------------------------------------------ Invalid input(s): POCBNP   Time Spent in minutes  80.   Otilio Miu M.D on 08/24/2019 at 2:29 PM   **Disclaimer: This note may have been dictated with voice recognition software. Similar sounding words can inadvertently be transcribed  and this note may contain transcription errors which may not have been corrected upon publication of note.**

## 2019-09-11 ENCOUNTER — Other Ambulatory Visit: Payer: Self-pay

## 2019-09-11 ENCOUNTER — Inpatient Hospital Stay: Payer: Medicare Other | Attending: Internal Medicine

## 2019-09-11 DIAGNOSIS — R232 Flushing: Secondary | ICD-10-CM | POA: Diagnosis not present

## 2019-09-11 DIAGNOSIS — C61 Malignant neoplasm of prostate: Secondary | ICD-10-CM | POA: Insufficient documentation

## 2019-09-11 DIAGNOSIS — N183 Chronic kidney disease, stage 3 unspecified: Secondary | ICD-10-CM | POA: Insufficient documentation

## 2019-09-11 DIAGNOSIS — M545 Low back pain: Secondary | ICD-10-CM | POA: Diagnosis not present

## 2019-09-11 DIAGNOSIS — Z803 Family history of malignant neoplasm of breast: Secondary | ICD-10-CM | POA: Diagnosis not present

## 2019-09-11 DIAGNOSIS — Z79899 Other long term (current) drug therapy: Secondary | ICD-10-CM | POA: Insufficient documentation

## 2019-09-11 DIAGNOSIS — I4891 Unspecified atrial fibrillation: Secondary | ICD-10-CM | POA: Insufficient documentation

## 2019-09-11 DIAGNOSIS — C7951 Secondary malignant neoplasm of bone: Secondary | ICD-10-CM | POA: Insufficient documentation

## 2019-09-11 DIAGNOSIS — G20 Parkinson's disease: Secondary | ICD-10-CM | POA: Diagnosis not present

## 2019-09-11 LAB — CBC WITH DIFFERENTIAL/PLATELET
Abs Immature Granulocytes: 0.06 10*3/uL (ref 0.00–0.07)
Basophils Absolute: 0.1 10*3/uL (ref 0.0–0.1)
Basophils Relative: 1 %
Eosinophils Absolute: 0 10*3/uL (ref 0.0–0.5)
Eosinophils Relative: 0 %
HCT: 42.5 % (ref 39.0–52.0)
Hemoglobin: 13.7 g/dL (ref 13.0–17.0)
Immature Granulocytes: 1 %
Lymphocytes Relative: 12 %
Lymphs Abs: 1.1 10*3/uL (ref 0.7–4.0)
MCH: 31.1 pg (ref 26.0–34.0)
MCHC: 32.2 g/dL (ref 30.0–36.0)
MCV: 96.6 fL (ref 80.0–100.0)
Monocytes Absolute: 0.5 10*3/uL (ref 0.1–1.0)
Monocytes Relative: 5 %
Neutro Abs: 7.5 10*3/uL (ref 1.7–7.7)
Neutrophils Relative %: 81 %
Platelets: 211 10*3/uL (ref 150–400)
RBC: 4.4 MIL/uL (ref 4.22–5.81)
RDW: 13.7 % (ref 11.5–15.5)
WBC: 9.2 10*3/uL (ref 4.0–10.5)
nRBC: 0 % (ref 0.0–0.2)

## 2019-09-11 LAB — COMPREHENSIVE METABOLIC PANEL
ALT: 7 U/L (ref 0–44)
AST: 28 U/L (ref 15–41)
Albumin: 4 g/dL (ref 3.5–5.0)
Alkaline Phosphatase: 52 U/L (ref 38–126)
Anion gap: 9 (ref 5–15)
BUN: 22 mg/dL (ref 8–23)
CO2: 25 mmol/L (ref 22–32)
Calcium: 10.2 mg/dL (ref 8.9–10.3)
Chloride: 106 mmol/L (ref 98–111)
Creatinine, Ser: 1.46 mg/dL — ABNORMAL HIGH (ref 0.61–1.24)
GFR calc Af Amer: 53 mL/min — ABNORMAL LOW (ref >60)
GFR calc non Af Amer: 45 mL/min — ABNORMAL LOW (ref >60)
Glucose, Bld: 126 mg/dL — ABNORMAL HIGH (ref 70–99)
Potassium: 3.6 mmol/L (ref 3.5–5.1)
Sodium: 140 mmol/L (ref 135–145)
Total Bilirubin: 2.5 mg/dL — ABNORMAL HIGH (ref 0.3–1.2)
Total Protein: 6.9 g/dL (ref 6.5–8.1)

## 2019-09-11 LAB — PSA: Prostatic Specific Antigen: 1.55 ng/mL (ref 0.00–4.00)

## 2019-09-11 NOTE — Progress Notes (Signed)
Patient has virtual visit, he is doing well mentions minor back pain 2/10

## 2019-09-12 ENCOUNTER — Other Ambulatory Visit: Payer: Self-pay | Admitting: *Deleted

## 2019-09-12 ENCOUNTER — Inpatient Hospital Stay (HOSPITAL_BASED_OUTPATIENT_CLINIC_OR_DEPARTMENT_OTHER): Payer: Medicare Other | Admitting: Internal Medicine

## 2019-09-12 DIAGNOSIS — C7951 Secondary malignant neoplasm of bone: Secondary | ICD-10-CM

## 2019-09-12 DIAGNOSIS — C61 Malignant neoplasm of prostate: Secondary | ICD-10-CM

## 2019-09-12 DIAGNOSIS — G20 Parkinson's disease: Secondary | ICD-10-CM | POA: Diagnosis not present

## 2019-09-12 DIAGNOSIS — I4891 Unspecified atrial fibrillation: Secondary | ICD-10-CM | POA: Diagnosis not present

## 2019-09-12 DIAGNOSIS — M545 Low back pain: Secondary | ICD-10-CM | POA: Diagnosis not present

## 2019-09-12 DIAGNOSIS — N183 Chronic kidney disease, stage 3 unspecified: Secondary | ICD-10-CM | POA: Diagnosis not present

## 2019-09-12 NOTE — Progress Notes (Signed)
I connected with MADDOXX PORTNOY on 09/12/19 at 11:00 AM EST by video enabled telemedicine visit and verified that I am speaking with the correct person using two identifiers.  I discussed the limitations, risks, security and privacy concerns of performing an evaluation and management service by telemedicine and the availability of in-person appointments. I also discussed with the patient that there may be a patient responsible charge related to this service. The patient expressed understanding and agreed to proceed.    Other persons participating in the visit and their role in the encounter: RN/medical reconciliation Patient's location: home Provider's location: office  Oncology History Overview Note  # PROSTATE CANCER- METATSTATIC to BONE; PSA- 26.5. Sclerotic 1.5 cm left ischial lesion/ Sclerotic medial left iliac bone 1.5 cm lesion (series 2/image 47), increased from 1.0 cm. Gleason score of 4+5= 9; with almost all cores involved greater than 80%.  9/16 Lupron 24-month depot on 9/16. 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  male with history of metastatic prostate cancer oligometastatic is here for follow-up.  Patient was recently admitted to hospital for A. fib with RVR.  States that his heart rate is under improved control.  Complains of mild left lower back pain.  Not any worse.  No significant radiation.  Is not taking any pain medications.  He denies any swelling in the legs.  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 Jan 2021- PSA- 1.55 trending down; STABLE.   # continue Zytiga+prednisone; Labs today reviewed;  acceptable for treatment.  Discussed that we will continue current dose of Zytiga at this time.  1 pill of Zytiga could be considered down the line.  # A.fib with RVR- ? Etiology- Unlikely Zytiga- stable.   # Low Back pain-from [facility] ? Recommend tylenol; avoid motrin/aleeve/advil.   # Difficulty with urination-on flomax; STABLE.   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  STABLE;   Monitor for now.  # HOt flashes-grade 1; STABLE;   # Genetics counseling-patient's daughter with breast cancer in her 41s [survived]; discussed genetic counseling/testing.  Will refer to [facility]. Again discussed today.   # DISPOSITION: # genetic counseling referral- Dx- prostate cancer # follow up in 6 weeks-MD; Labs-1-2 day prior- cbc/cmp/PSA;-Dr.B   Follow-up instructions:  I discussed the assessment and treatment plan with the patient.  The patient was provided an opportunity to ask questions and all were answered.  The patient agreed with the plan and demonstrated understanding of instructions.  The patient was advised to call back or seek an in person evaluation if the symptoms worsen or if the condition fails to improve as anticipated.  Dr. Charlaine Dalton [place] at [facility] 09/12/2019 11:25 AM

## 2019-09-12 NOTE — Assessment & Plan Note (Addendum)
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 Jan 2021- PSA- 1.55 trending down; STABLE.   # continue Zytiga+prednisone; Labs today reviewed;  acceptable for treatment.  Discussed that we will continue current dose of Zytiga at this time.  1 pill of Zytiga could be considered down the line.  # A.fib with RVR- ? Etiology- Unlikely Zytiga- stable.   # Low Back pain-from [facility] ? Recommend tylenol; avoid motrin/aleeve/advil.   # Difficulty with urination-on flomax; STABLE.   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  STABLE;   Monitor for now.  # HOt flashes-grade 1; STABLE;   # Genetics counseling-patient's daughter with breast cancer in her 4s [survived]; discussed genetic counseling/testing.  Will refer to [facility]. Again discussed today.   # DISPOSITION: # genetic counseling referral- Dx- prostate cancer # follow up in 6 weeks-MD; [id number] day prior- cbc/cmp/PSA;-Dr.B

## 2019-09-13 MED FILL — predniSONE 10 MG TABS: 10 | 30 days supply | Qty: 30 | Fill #3

## 2019-09-13 MED FILL — ABIRATERONE ACETATE 250 MG: 250 | 30 days supply | Qty: 120 | Fill #3

## 2019-09-14 ENCOUNTER — Encounter: Payer: Self-pay | Admitting: Family Medicine

## 2019-09-21 ENCOUNTER — Other Ambulatory Visit: Payer: Self-pay

## 2019-09-21 DIAGNOSIS — C61 Malignant neoplasm of prostate: Secondary | ICD-10-CM

## 2019-09-21 DIAGNOSIS — C7951 Secondary malignant neoplasm of bone: Secondary | ICD-10-CM

## 2019-09-22 ENCOUNTER — Other Ambulatory Visit: Payer: Medicare Other

## 2019-09-22 DIAGNOSIS — C7951 Secondary malignant neoplasm of bone: Secondary | ICD-10-CM

## 2019-09-22 DIAGNOSIS — C61 Malignant neoplasm of prostate: Secondary | ICD-10-CM

## 2019-09-23 LAB — PSA: Prostate Specific Ag, Serum: 1.7 ng/mL (ref 0.0–4.0)

## 2019-09-25 ENCOUNTER — Encounter: Payer: Self-pay | Admitting: Urology

## 2019-09-25 ENCOUNTER — Ambulatory Visit (INDEPENDENT_AMBULATORY_CARE_PROVIDER_SITE_OTHER): Payer: Medicare Other | Admitting: Urology

## 2019-09-25 ENCOUNTER — Other Ambulatory Visit: Payer: Self-pay

## 2019-09-25 VITALS — BP 114/70 | HR 50 | Ht 69.0 in | Wt 160.0 lb

## 2019-09-25 DIAGNOSIS — C61 Malignant neoplasm of prostate: Secondary | ICD-10-CM

## 2019-09-25 LAB — BLADDER SCAN AMB NON-IMAGING

## 2019-09-25 MED ORDER — LEUPROLIDE ACETATE (6 MONTH) 45 MG ~~LOC~~ KIT
45.0000 mg | PACK | Freq: Once | SUBCUTANEOUS | Status: AC
  Administered 2019-09-25: 45 mg via SUBCUTANEOUS

## 2019-09-25 NOTE — Progress Notes (Signed)
   09/25/2019 2:17 PM   [name] [name] Jan 16, 1941 [id number]  Reason for visit: Follow up metastatic prostate cancer, urinary symptoms  HPI: I saw [name] back in urology clinic for the above issues.  He is a 79 year old male with Parkinson's disease, solitary right kidney, and metastatic prostate cancer to the left ischial tuberosity/ilium currently being treated with ADT, plus Zytiga and prednisone managed by oncology.  PSA is stable at 1.7.  He reports that his urination has improved since being on ADT and he denies any urinary complaints today.  He continues to take Flomax.  He denies any gross hematuria, weak stream, or incontinence.  PVR is 49 mL.  He was recently hospitalized in December for A. fib with RVR, and is on diltiazem and Eliquis.  Overall, he feels he is doing well and has no specific complaints today.  37-month Lupron injection given today.  Continue follow-up with oncology for treatment of metastatic prostate cancer RTC 6 months for Lupron injection, check on urinary symptoms  A total of 30 minutes were spent face-to-face with the patient, greater than 50% was spent in patient education, counseling, and coordination of care regarding metastatic prostate cancer and urinary symptoms.  [place], [facility] [address] [name], [place] [address] [phone]

## 2019-09-25 NOTE — Patient Instructions (Signed)
Leuprolide injection What is this medicine? LEUPROLIDE (loo PROE lide) is a man-made hormone. It is used to treat the symptoms of prostate cancer. This medicine may also be used to treat children with early onset of puberty. It may be used for other hormonal conditions. This medicine may be used for other purposes; ask your health care provider or pharmacist if you have questions. COMMON BRAND NAME(S): Lupron What should I tell my health care provider before I take this medicine? They need to know if you have any of these conditions:  diabetes  heart disease or previous heart attack  high blood pressure  high cholesterol  pain or difficulty passing urine  spinal cord metastasis  stroke  tobacco smoker  an unusual or allergic reaction to leuprolide, benzyl alcohol, other medicines, foods, dyes, or preservatives  pregnant or trying to get pregnant  breast-feeding How should I use this medicine? This medicine is for injection under the skin or into a muscle. You will be taught how to prepare and give this medicine. Use exactly as directed. Take your medicine at regular intervals. Do not take your medicine more often than directed. It is important that you put your used needles and syringes in a special sharps container. Do not put them in a trash can. If you do not have a sharps container, call your pharmacist or healthcare provider to get one. A special MedGuide will be given to you by the pharmacist with each prescription and refill. Be sure to read this information carefully each time. Talk to your pediatrician regarding the use of this medicine in children. While this medicine may be prescribed for children as young as 8 years for selected conditions, precautions do apply. Overdosage: If you think you have taken too much of this medicine contact a poison control center or emergency room at once. NOTE: This medicine is only for you. Do not share this medicine with others. What if  I miss a dose? If you miss a dose, take it as soon as you can. If it is almost time for your next dose, take only that dose. Do not take double or extra doses. What may interact with this medicine? Do not take this medicine with any of the following medications:  chasteberry This medicine may also interact with the following medications:  herbal or dietary supplements, like black cohosh or DHEA  male hormones, like estrogens or progestins and birth control pills, patches, rings, or injections  male hormones, like testosterone This list may not describe all possible interactions. Give your health care provider a list of all the medicines, herbs, non-prescription drugs, or dietary supplements you use. Also tell them if you smoke, drink alcohol, or use illegal drugs. Some items may interact with your medicine. What should I watch for while using this medicine? Visit your doctor or health care professional for regular checks on your progress. During the first week, your symptoms may get worse, but then will improve as you continue your treatment. You may get hot flashes, increased bone pain, increased difficulty passing urine, or an aggravation of nerve symptoms. Discuss these effects with your doctor or health care professional, some of them may improve with continued use of this medicine. Male patients may experience a menstrual cycle or spotting during the first 2 months of therapy with this medicine. If this continues, contact your doctor or health care professional. This medicine may increase blood sugar. Ask your healthcare provider if changes in diet or medicines are needed if   you have diabetes. What side effects may I notice from receiving this medicine? Side effects that you should report to your doctor or health care professional as soon as possible:  allergic reactions like skin rash, itching or hives, swelling of the face, lips, or tongue  breathing problems  chest  pain  depression or memory disorders  pain in your legs or groin  pain at site where injected  severe headache  signs and symptoms of high blood sugar such as being more thirsty or hungry or having to urinate more than normal. You may also feel very tired or have blurry vision  swelling of the feet and legs  visual changes  vomiting Side effects that usually do not require medical attention (report to your doctor or health care professional if they continue or are bothersome):  breast swelling or tenderness  decrease in sex drive or performance  diarrhea  hot flashes  loss of appetite  muscle, joint, or bone pains  nausea  redness or irritation at site where injected  skin problems or acne This list may not describe all possible side effects. Call your doctor for medical advice about side effects. You may report side effects to FDA at 1-800-FDA-1088. Where should I keep my medicine? Keep out of the reach of children. Store below 25 degrees C (77 degrees F). Do not freeze. Protect from light. Do not use if it is not clear or if there are particles present. Throw away any unused medicine after the expiration date. NOTE: This sheet is a summary. It may not cover all possible information. If you have questions about this medicine, talk to your doctor, pharmacist, or health care provider.  2020 Elsevier/Gold Standard (2018-06-23 09:52:48)  

## 2019-09-25 NOTE — Progress Notes (Signed)
Eligard SubQ Injection   Due to Prostate Cancer patient is present today for a Eligard Injection.  Medication: Eligard 6 month Dose: 45 mg  Location: right abdomen Lot: [id number] Exp: 06/06/2021  Patient tolerated well, no complications were noted  Performed by: Fonnie Jarvis, CMA  Per Dr. Diamantina Providence patient is to continue therapy indefinitely . Patient's next follow up was scheduled for 6 months. This appointment was scheduled using wheel and given to patient today along with reminder continue on Vitamin D 800-1000iu and Calium 1000-1200mg  daily while on Androgen Deprivation Therapy.

## 2019-10-03 DIAGNOSIS — M5126 Other intervertebral disc displacement, lumbar region: Secondary | ICD-10-CM | POA: Diagnosis not present

## 2019-10-03 DIAGNOSIS — M47816 Spondylosis without myelopathy or radiculopathy, lumbar region: Secondary | ICD-10-CM | POA: Diagnosis not present

## 2019-10-03 DIAGNOSIS — M5489 Other dorsalgia: Secondary | ICD-10-CM | POA: Diagnosis not present

## 2019-10-03 DIAGNOSIS — M5136 Other intervertebral disc degeneration, lumbar region: Secondary | ICD-10-CM | POA: Diagnosis not present

## 2019-10-18 MED FILL — predniSONE 10 MG TABS: 10 | 30 days supply | Qty: 30 | Fill #4

## 2019-10-18 MED FILL — ABIRATERONE ACETATE 250 MG: 250 | 30 days supply | Qty: 120 | Fill #4

## 2019-10-20 ENCOUNTER — Other Ambulatory Visit: Payer: Self-pay

## 2019-10-23 ENCOUNTER — Other Ambulatory Visit: Payer: Self-pay

## 2019-10-23 ENCOUNTER — Inpatient Hospital Stay: Payer: Medicare Other | Attending: Internal Medicine

## 2019-10-23 DIAGNOSIS — Z803 Family history of malignant neoplasm of breast: Secondary | ICD-10-CM | POA: Diagnosis not present

## 2019-10-23 DIAGNOSIS — Z833 Family history of diabetes mellitus: Secondary | ICD-10-CM | POA: Insufficient documentation

## 2019-10-23 DIAGNOSIS — R232 Flushing: Secondary | ICD-10-CM | POA: Insufficient documentation

## 2019-10-23 DIAGNOSIS — N183 Chronic kidney disease, stage 3 unspecified: Secondary | ICD-10-CM | POA: Diagnosis not present

## 2019-10-23 DIAGNOSIS — K5900 Constipation, unspecified: Secondary | ICD-10-CM | POA: Insufficient documentation

## 2019-10-23 DIAGNOSIS — G20 Parkinson's disease: Secondary | ICD-10-CM | POA: Insufficient documentation

## 2019-10-23 DIAGNOSIS — C7951 Secondary malignant neoplasm of bone: Secondary | ICD-10-CM | POA: Diagnosis not present

## 2019-10-23 DIAGNOSIS — Z8249 Family history of ischemic heart disease and other diseases of the circulatory system: Secondary | ICD-10-CM | POA: Insufficient documentation

## 2019-10-23 DIAGNOSIS — C61 Malignant neoplasm of prostate: Secondary | ICD-10-CM | POA: Diagnosis not present

## 2019-10-23 DIAGNOSIS — Z87891 Personal history of nicotine dependence: Secondary | ICD-10-CM | POA: Diagnosis not present

## 2019-10-23 DIAGNOSIS — Z7901 Long term (current) use of anticoagulants: Secondary | ICD-10-CM | POA: Insufficient documentation

## 2019-10-23 DIAGNOSIS — Z79899 Other long term (current) drug therapy: Secondary | ICD-10-CM | POA: Diagnosis not present

## 2019-10-23 LAB — COMPREHENSIVE METABOLIC PANEL
ALT: 6 U/L (ref 0–44)
AST: 35 U/L (ref 15–41)
Albumin: 4.1 g/dL (ref 3.5–5.0)
Alkaline Phosphatase: 64 U/L (ref 38–126)
Anion gap: 9 (ref 5–15)
BUN: 18 mg/dL (ref 8–23)
CO2: 30 mmol/L (ref 22–32)
Calcium: 10.3 mg/dL (ref 8.9–10.3)
Chloride: 103 mmol/L (ref 98–111)
Creatinine, Ser: 1.69 mg/dL — ABNORMAL HIGH (ref 0.61–1.24)
GFR calc Af Amer: 44 mL/min — ABNORMAL LOW (ref >60)
GFR calc non Af Amer: 38 mL/min — ABNORMAL LOW (ref >60)
Glucose, Bld: 113 mg/dL — ABNORMAL HIGH (ref 70–99)
Potassium: 4.1 mmol/L (ref 3.5–5.1)
Sodium: 142 mmol/L (ref 135–145)
Total Bilirubin: 1.4 mg/dL — ABNORMAL HIGH (ref 0.3–1.2)
Total Protein: 6.9 g/dL (ref 6.5–8.1)

## 2019-10-23 LAB — CBC WITH DIFFERENTIAL/PLATELET
Abs Immature Granulocytes: 0.07 10*3/uL (ref 0.00–0.07)
Basophils Absolute: 0.1 10*3/uL (ref 0.0–0.1)
Basophils Relative: 1 %
Eosinophils Absolute: 0.1 10*3/uL (ref 0.0–0.5)
Eosinophils Relative: 1 %
HCT: 42.4 % (ref 39.0–52.0)
Hemoglobin: 13.3 g/dL (ref 13.0–17.0)
Immature Granulocytes: 1 %
Lymphocytes Relative: 11 %
Lymphs Abs: 1.2 10*3/uL (ref 0.7–4.0)
MCH: 31.5 pg (ref 26.0–34.0)
MCHC: 31.4 g/dL (ref 30.0–36.0)
MCV: 100.5 fL — ABNORMAL HIGH (ref 80.0–100.0)
Monocytes Absolute: 0.6 10*3/uL (ref 0.1–1.0)
Monocytes Relative: 6 %
Neutro Abs: 8.6 10*3/uL — ABNORMAL HIGH (ref 1.7–7.7)
Neutrophils Relative %: 80 %
Platelets: 227 10*3/uL (ref 150–400)
RBC: 4.22 MIL/uL (ref 4.22–5.81)
RDW: 13.1 % (ref 11.5–15.5)
WBC: 10.6 10*3/uL — ABNORMAL HIGH (ref 4.0–10.5)
nRBC: 0 % (ref 0.0–0.2)

## 2019-10-23 LAB — PSA: Prostatic Specific Antigen: 1.14 ng/mL (ref 0.00–4.00)

## 2019-10-24 ENCOUNTER — Other Ambulatory Visit: Payer: Self-pay

## 2019-10-24 ENCOUNTER — Inpatient Hospital Stay (HOSPITAL_BASED_OUTPATIENT_CLINIC_OR_DEPARTMENT_OTHER): Payer: Medicare Other | Admitting: Internal Medicine

## 2019-10-24 DIAGNOSIS — G20 Parkinson's disease: Secondary | ICD-10-CM | POA: Diagnosis not present

## 2019-10-24 DIAGNOSIS — K5900 Constipation, unspecified: Secondary | ICD-10-CM | POA: Diagnosis not present

## 2019-10-24 DIAGNOSIS — C7951 Secondary malignant neoplasm of bone: Secondary | ICD-10-CM

## 2019-10-24 DIAGNOSIS — N183 Chronic kidney disease, stage 3 unspecified: Secondary | ICD-10-CM | POA: Diagnosis not present

## 2019-10-24 DIAGNOSIS — R232 Flushing: Secondary | ICD-10-CM | POA: Diagnosis not present

## 2019-10-24 DIAGNOSIS — C61 Malignant neoplasm of prostate: Secondary | ICD-10-CM | POA: Diagnosis not present

## 2019-10-24 NOTE — Assessment & Plan Note (Addendum)
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 On Lupron [urology/ q34m;last 10/2019] + Zytiga+prednsione. Jan 2021- PSA- 1.15 trending down; STABLE.   # continue Zytiga+prednisone; Labs today reviewed. Doing well except for slightly worsening creat- see below.   # Constipation- continue miralax.   #A. Fib-Eliquis stable.   # Difficulty with urination-on flomax; stable.   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 Recommend increase fluids intake.  If renal function worse would recommend further work-up with a ultrasound kidneys.  # HOt flashes-grade 1- stable.   # DISPOSITION: # follow up in 4 weeks-MD; Labs-1-2 day prior- cbc/cmp/PSA;-Dr.B

## 2019-10-24 NOTE — Progress Notes (Signed)
[facility] NOTE  Patient Care Team: Juline Patch, MD as PCP - General (Family Medicine) Tat, Eustace Quail, DO as Consulting Physician (Neurology) Cammie Sickle, MD as Consulting Physician (Hematology and Oncology)  CHIEF COMPLAINTS/PURPOSE OF CONSULTATION: PROSTATE CANCER  #  Oncology History Overview Note  # PROSTATE CANCER- METATSTATIC to BONE; PSA- 26.5. Sclerotic 1.5 cm left ischial lesion/ Sclerotic medial left iliac bone 1.5 cm lesion (series 2/image 47), increased from 1.0 cm. Gleason score of 4+5= 9; with almost all cores involved greater than 80%.  9/16 Lupron 66-month depot on 9/16. [Urology; Dr.Siniski]  # MID OCT 2020- Zytiga 1000 mg+ prednisone  # Parkinsons's syndrome [Dr.Tat; [place]; neurologist]; CKD-III [creat1.3-1.5]  # [facility]- referred  #  DIAGNOSIS: Prostate cancer  STAGE:     4    ;  GOALS: Palliative/control  CURRENT/MOST RECENT THERAPY : Lupron+ Zytiga      Prostate cancer metastatic to bone ([place])  05/24/2019 Initial Diagnosis   Prostate cancer metastatic to bone ([place])    HISTORY OF PRESENTING ILLNESS:  [name] [name] 79 y.o.  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  Patient is currently on Lupron; and started on Zytiga approximately 3 months ago.  Patient denies any swelling in the legs.  Denies any nausea vomiting.  No diarrhea.  No further episodes of A. fib.   Review of Systems  Constitutional: Negative for chills, diaphoresis, fever, malaise/fatigue and weight loss.  HENT: Negative for nosebleeds and sore throat.   Eyes: Negative for double vision.  Respiratory: Negative for cough, hemoptysis, sputum production, shortness of breath and wheezing.   Cardiovascular: Negative for chest pain, palpitations, orthopnea and leg swelling.  Gastrointestinal: Positive for constipation. Negative for abdominal pain, blood in stool, diarrhea, heartburn,  melena, nausea and vomiting.  Genitourinary: Positive for frequency and urgency. Negative for dysuria.  Musculoskeletal: Negative for back pain and joint pain.  Skin: Negative.  Negative for itching and rash.  Neurological: Positive for tremors. Negative for dizziness, tingling, focal weakness, weakness and headaches.  Endo/Heme/Allergies: Does not bruise/bleed easily.  Psychiatric/Behavioral: Negative for depression. The patient is not nervous/anxious and does not have insomnia.     MEDICAL HISTORY:  Past Medical History:  Diagnosis Date  . Parkinson's disease ([place])   . REM sleep behavior disorder   . Sleep apnea     SURGICAL HISTORY: Past Surgical History:  Procedure Laterality Date  . APPENDECTOMY    . 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 Marital status: Married    Spouse name: Not on file  . Number of children: Not on file  . Years of education: Not on file  . Highest education level: Not on file  Occupational History  . Occupation: retired    Comment: Nutritional therapist  Tobacco Use  . Smoking status: Former Smoker    Types: Cigarettes, Cigars    Quit date: 09/07/2010    Years since quitting: 9.1  . Smokeless tobacco: Never Used  Substance and Sexual Activity  . Alcohol use: Yes    Comment: 1 can beer/day  . Drug use: Never  . Sexual activity: Not Currently  Other Topics Concern  . Not on file  Social History Narrative   Lives in [place]; quit smoking in 2012; ocassional alcohol; Camera operator; wife; 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    Social Determinants of Radio broadcast assistant  Strain:   . Difficulty of Paying Living Expenses: Not on file  Food Insecurity:   . Worried About Charity fundraiser in the Last Year: Not on file  . Ran Out of Food in the Last Year: Not on file  Transportation Needs:   . Lack of Transportation (Medical): Not on file  . Lack of Transportation (Non-Medical): Not on file   Physical Activity:   . Days of Exercise per Week: Not on file  . Minutes of Exercise per Session: Not on file  Stress:   . Feeling of Stress : Not on file  Social Connections:   . Frequency of Communication with Friends and Family: Not on file  . Frequency of Social Gatherings with Friends and Family: Not on file  . Attends Religious Services: Not on file  . Active Member of Clubs or Organizations: Not on file  . Attends Archivist Meetings: Not on file  . Marital Status: Not on file  Intimate Partner Violence:   . Fear of Current or Ex-Partner: Not on file  . Emotionally Abused: Not on file  . Physically Abused: Not on file  . Sexually Abused: Not on file    FAMILY HISTORY: Family History  Problem Relation Age of Onset  . Heart disease Maternal Grandfather   . Diabetes Paternal Grandfather   . Breast cancer Daughter     ALLERGIES:  is allergic to influenza vaccines.  MEDICATIONS:  Current Outpatient Medications  Medication Sig Dispense Refill  . abiraterone acetate (ZYTIGA) 250 MG tablet Take 4 tablets (1,000 mg total) by mouth daily. Take on an empty stomach 1 hour before or 2 hours after a meal 120 tablet 4  . apixaban (ELIQUIS) 5 MG TABS tablet Take 1 tablet (5 mg total) by mouth 2 (two) times daily. 60 tablet 1  . carbidopa-levodopa (SINEMET IR) 25-100 MG tablet Take 2 tablets by mouth 4 (four) times daily. TAKE 2 tablets at 7AM, 2 tablet at 11AM, 2 tablets at 3PM, 1 tablet at 7PM    . clonazePAM (KLONOPIN) 0.5 MG tablet Take 0.5 mg by mouth at bedtime. Dr Lisabeth Devoid    . diltiazem (CARDIZEM CD) 120 MG 24 hr capsule Take 1 capsule (120 mg total) by mouth daily. 30 capsule 2  . Glycerin-Hypromellose-PEG 400 (VISINE DRY EYE OP) Apply to eye. otc    . ibuprofen (ADVIL,MOTRIN) 600 MG tablet Take by mouth as needed. otc    . ketoconazole (NIZORAL) 2 % shampoo Apply 1 application topically 2 (two) times a week.     . predniSONE (DELTASONE) 10 MG tablet Take 1 tablet (10  mg total) by mouth daily with breakfast. 30 tablet 6  . tamsulosin (FLOMAX) 0.4 MG CAPS capsule Take 1 capsule (0.4 mg total) by mouth daily. (Patient taking differently: Take 0.4 mg by mouth daily. [place]) 90 capsule 3   No current facility-administered medications for this visit.      Marland Kitchen  PHYSICAL EXAMINATION: ECOG PERFORMANCE STATUS: 0 - Asymptomatic  Vitals:   10/24/19 1005  BP: 111/74  Pulse: (!) 53  Resp: 18  Temp: (!) 96.8 F (36 C)  SpO2: 100%   Filed Weights   10/24/19 1005  Weight: 166 lb 12.8 oz (75.7 kg)    Physical Exam  Constitutional: He is oriented to person, place, and time and well-developed, well-nourished, and in no distress.  HENT:  Head: Normocephalic and atraumatic.  Mouth/Throat: Oropharynx is clear and moist. No oropharyngeal exudate.  Eyes: Pupils are equal,  round, and reactive to light.  Cardiovascular: Normal rate and regular rhythm.  Pulmonary/Chest: No respiratory distress. He has no wheezes.  Abdominal: Soft. Bowel sounds are normal. He exhibits no distension and no mass. There is no abdominal tenderness. There is no rebound and no guarding.  Musculoskeletal:        General: No tenderness or edema. Normal range of motion.     Cervical back: Normal range of motion and neck supple.  Neurological: He is alert and oriented to person, place, and time.  Tremor of right upper extremity.  Skin: Skin is warm.  Psychiatric: Affect normal.     LABORATORY DATA:  I have reviewed the data as listed Lab Results  Component Value Date   WBC 10.6 (H) 10/23/2019   HGB 13.3 10/23/2019   HCT 42.4 10/23/2019   MCV 100.5 (H) 10/23/2019   PLT 227 10/23/2019   Recent Labs    08/16/19 1505 08/16/19 1505 08/17/19 0445 09/11/19 1032 10/23/19 1056  NA 142   < > 140 140 142  K 4.4   < > 3.6 3.6 4.1  CL 107   < > 108 106 103  CO2 23   < > 24 25 30   GLUCOSE 113*   < > 103* 126* 113*  BUN 18   < > 16 22 18   CREATININE 1.48*   < > 1.30* 1.46* 1.69*   CALCIUM 10.2   < > 9.2 10.2 10.3  GFRNONAA 45*   < > 53* 45* 38*  GFRAA 52*   < > >60 53* 44*  PROT 7.1  --   --  6.9 6.9  ALBUMIN 4.2  --   --  4.0 4.1  AST 41  --   --  28 35  ALT 10  --   --  7 6  ALKPHOS 58  --   --  52 64  BILITOT 2.3*  --   --  2.5* 1.4*   < > = values in this interval not displayed.    RADIOGRAPHIC STUDIES: I have personally reviewed the radiological images as listed and agreed with the findings in the report. No results found.  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 On Lupron [urology/ q34m;last 10/2019] + Zytiga+prednsione. Jan 2021- PSA- 1.15 trending down; STABLE.   # continue Zytiga+prednisone; Labs today reviewed. Doing well except for slightly worsening creat- see below.   # Constipation- continue miralax.   # Difficulty with urination-on flomax; stable.   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 Recommend increase fluids intake.   # HOt flashes-grade 1- stable.   # DISPOSITION: # follow up in 4 weeks-MD; Labs-1-2 day prior- cbc/cmp/PSA;-Dr.B   All questions were answered. The patient knows to call the clinic with any problems, questions or concerns.    Cammie Sickle, MD 10/24/2019 11:10 AM

## 2019-11-13 ENCOUNTER — Other Ambulatory Visit: Payer: Self-pay | Admitting: Internal Medicine

## 2019-11-13 DIAGNOSIS — C61 Malignant neoplasm of prostate: Secondary | ICD-10-CM

## 2019-11-13 DIAGNOSIS — C7951 Secondary malignant neoplasm of bone: Secondary | ICD-10-CM

## 2019-11-17 ENCOUNTER — Other Ambulatory Visit: Payer: Self-pay

## 2019-11-17 ENCOUNTER — Inpatient Hospital Stay: Payer: Medicare Other | Attending: Internal Medicine

## 2019-11-17 DIAGNOSIS — Z803 Family history of malignant neoplasm of breast: Secondary | ICD-10-CM | POA: Insufficient documentation

## 2019-11-17 DIAGNOSIS — R3915 Urgency of urination: Secondary | ICD-10-CM | POA: Insufficient documentation

## 2019-11-17 DIAGNOSIS — G20 Parkinson's disease: Secondary | ICD-10-CM | POA: Diagnosis not present

## 2019-11-17 DIAGNOSIS — R350 Frequency of micturition: Secondary | ICD-10-CM | POA: Diagnosis not present

## 2019-11-17 DIAGNOSIS — N183 Chronic kidney disease, stage 3 unspecified: Secondary | ICD-10-CM | POA: Diagnosis not present

## 2019-11-17 DIAGNOSIS — Z7952 Long term (current) use of systemic steroids: Secondary | ICD-10-CM | POA: Diagnosis not present

## 2019-11-17 DIAGNOSIS — Z79899 Other long term (current) drug therapy: Secondary | ICD-10-CM | POA: Diagnosis not present

## 2019-11-17 DIAGNOSIS — Z833 Family history of diabetes mellitus: Secondary | ICD-10-CM | POA: Insufficient documentation

## 2019-11-17 DIAGNOSIS — Z7901 Long term (current) use of anticoagulants: Secondary | ICD-10-CM | POA: Diagnosis not present

## 2019-11-17 DIAGNOSIS — Z87891 Personal history of nicotine dependence: Secondary | ICD-10-CM | POA: Insufficient documentation

## 2019-11-17 DIAGNOSIS — Z8249 Family history of ischemic heart disease and other diseases of the circulatory system: Secondary | ICD-10-CM | POA: Insufficient documentation

## 2019-11-17 DIAGNOSIS — C61 Malignant neoplasm of prostate: Secondary | ICD-10-CM | POA: Insufficient documentation

## 2019-11-17 DIAGNOSIS — C7951 Secondary malignant neoplasm of bone: Secondary | ICD-10-CM | POA: Diagnosis not present

## 2019-11-17 LAB — CBC WITH DIFFERENTIAL/PLATELET
Abs Immature Granulocytes: 0.06 10*3/uL (ref 0.00–0.07)
Basophils Absolute: 0 10*3/uL (ref 0.0–0.1)
Basophils Relative: 0 %
Eosinophils Absolute: 0 10*3/uL (ref 0.0–0.5)
Eosinophils Relative: 0 %
HCT: 41.2 % (ref 39.0–52.0)
Hemoglobin: 13.6 g/dL (ref 13.0–17.0)
Immature Granulocytes: 1 %
Lymphocytes Relative: 11 %
Lymphs Abs: 1 10*3/uL (ref 0.7–4.0)
MCH: 31.9 pg (ref 26.0–34.0)
MCHC: 33 g/dL (ref 30.0–36.0)
MCV: 96.5 fL (ref 80.0–100.0)
Monocytes Absolute: 0.3 10*3/uL (ref 0.1–1.0)
Monocytes Relative: 3 %
Neutro Abs: 7.5 10*3/uL (ref 1.7–7.7)
Neutrophils Relative %: 85 %
Platelets: 217 10*3/uL (ref 150–400)
RBC: 4.27 MIL/uL (ref 4.22–5.81)
RDW: 12.9 % (ref 11.5–15.5)
WBC: 8.9 10*3/uL (ref 4.0–10.5)
nRBC: 0 % (ref 0.0–0.2)

## 2019-11-17 LAB — COMPREHENSIVE METABOLIC PANEL
ALT: 16 U/L (ref 0–44)
AST: 36 U/L (ref 15–41)
Albumin: 4.1 g/dL (ref 3.5–5.0)
Alkaline Phosphatase: 61 U/L (ref 38–126)
Anion gap: 11 (ref 5–15)
BUN: 15 mg/dL (ref 8–23)
CO2: 23 mmol/L (ref 22–32)
Calcium: 9.6 mg/dL (ref 8.9–10.3)
Chloride: 106 mmol/L (ref 98–111)
Creatinine, Ser: 1.57 mg/dL — ABNORMAL HIGH (ref 0.61–1.24)
GFR calc Af Amer: 48 mL/min — ABNORMAL LOW (ref >60)
GFR calc non Af Amer: 42 mL/min — ABNORMAL LOW (ref >60)
Glucose, Bld: 133 mg/dL — ABNORMAL HIGH (ref 70–99)
Potassium: 3.9 mmol/L (ref 3.5–5.1)
Sodium: 140 mmol/L (ref 135–145)
Total Bilirubin: 1.4 mg/dL — ABNORMAL HIGH (ref 0.3–1.2)
Total Protein: 7.1 g/dL (ref 6.5–8.1)

## 2019-11-17 LAB — PSA: Prostatic Specific Antigen: 0.8 ng/mL (ref 0.00–4.00)

## 2019-11-20 ENCOUNTER — Other Ambulatory Visit: Payer: Self-pay

## 2019-11-20 MED FILL — predniSONE 10 MG TABS: 10 | 30 days supply | Qty: 30 | Fill #5

## 2019-11-20 MED FILL — ABIRATERONE ACETATE 250 MG: 250 | 30 days supply | Qty: 120 | Fill #0

## 2019-11-21 ENCOUNTER — Inpatient Hospital Stay (HOSPITAL_BASED_OUTPATIENT_CLINIC_OR_DEPARTMENT_OTHER): Payer: Medicare Other | Admitting: Internal Medicine

## 2019-11-21 DIAGNOSIS — M5489 Other dorsalgia: Secondary | ICD-10-CM | POA: Diagnosis not present

## 2019-11-21 DIAGNOSIS — C61 Malignant neoplasm of prostate: Secondary | ICD-10-CM | POA: Diagnosis not present

## 2019-11-21 DIAGNOSIS — M5136 Other intervertebral disc degeneration, lumbar region: Secondary | ICD-10-CM | POA: Diagnosis not present

## 2019-11-21 DIAGNOSIS — R3915 Urgency of urination: Secondary | ICD-10-CM | POA: Diagnosis not present

## 2019-11-21 DIAGNOSIS — R350 Frequency of micturition: Secondary | ICD-10-CM | POA: Diagnosis not present

## 2019-11-21 DIAGNOSIS — C7951 Secondary malignant neoplasm of bone: Secondary | ICD-10-CM

## 2019-11-21 DIAGNOSIS — N183 Chronic kidney disease, stage 3 unspecified: Secondary | ICD-10-CM | POA: Diagnosis not present

## 2019-11-21 DIAGNOSIS — G20 Parkinson's disease: Secondary | ICD-10-CM | POA: Diagnosis not present

## 2019-11-21 DIAGNOSIS — M5126 Other intervertebral disc displacement, lumbar region: Secondary | ICD-10-CM | POA: Diagnosis not present

## 2019-11-21 NOTE — Assessment & Plan Note (Addendum)
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 On Lupron [urology/ q90m;last 10/2019] + Zytiga+prednsione. FEB 2021- PSA- 0.8 trending down; STABLE.  # continue Zytiga+prednisone; Labs today reviewed.  Discussed the prognosis of metastatic prostate cancer is overall incurable.  However median survival is in the order of 5 years.  Given the patient has oligometastatic disease/and had a good response to current therapy-life expectancy in the order of years.  #A. Fib-Eliquis- STABLE.   # Difficulty with urination-on flomax;-STABLE.   #CKD stage III-creatinine 1.58; worse- GFR ~43-overall stable.  # HOt flashes-grade 1- STABLE.   # DISPOSITION: # follow up in 6 weeks-MD; Labs-1-2 day prior- cbc/cmp/PSA;-Dr.B

## 2019-11-21 NOTE — Progress Notes (Signed)
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 Sclerotic 1.5 cm left ischial lesion/ Sclerotic medial left iliac bone 1.5 cm lesion (series 2/image 47), increased from 1.0 cm. Gleason score of 4+5= 9; with almost all cores involved greater than 80%.  9/16 Lupron 1-month depot on 9/16. [Urology; Dr.Siniski]  # MID OCT 2020- Zytiga 1000 mg+ prednisone  # Parkinsons's syndrome [Dr.Tat; [place]; neurologist]; CKD-III [creat1.3-1.5]  # GC- referred  # DECLINES- Palliative care [316/2021]  DIAGNOSIS: Prostate cancer  STAGE:     4    ;  GOALS: Palliative/control  CURRENT/MOST RECENT THERAPY : Lupron+ Zytiga      Prostate cancer metastatic to bone ([place])  05/24/2019 Initial Diagnosis   Prostate cancer metastatic to bone ([facility])    HISTORY OF PRESENTING ILLNESS:  [name] [name] 79 y.o.  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  Patient has not had any further episodes of A. Fib.  Denies any swelling in the legs.  No nausea vomiting.    Review of Systems  Constitutional: Negative for chills, diaphoresis, fever, malaise/fatigue and weight loss.  HENT: Negative for nosebleeds and sore throat.   Eyes: Negative for double vision.  Respiratory: Negative for cough, hemoptysis, sputum production, shortness of breath and wheezing.   Cardiovascular: Negative for chest pain, palpitations, orthopnea and leg swelling.  Gastrointestinal: Positive for constipation. Negative for abdominal pain, blood in stool, diarrhea, heartburn, melena, nausea and  vomiting.  Genitourinary: Positive for frequency and urgency. Negative for dysuria.  Musculoskeletal: Negative for back pain and joint pain.  Skin: Negative.  Negative for itching and rash.  Neurological: Positive for tremors. Negative for dizziness, tingling, focal weakness, weakness and headaches.  Endo/Heme/Allergies: Does not bruise/bleed easily.  Psychiatric/Behavioral: Negative for depression. The patient is not nervous/anxious and does not have insomnia.     MEDICAL HISTORY:  Past Medical History:  Diagnosis Date  . Parkinson's disease ([place])   . REM sleep behavior disorder   . Sleep apnea     SURGICAL HISTORY: Past Surgical History:  Procedure Laterality Date  . APPENDECTOMY    . 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 Marital status: Married    Spouse name: Not on file  . Number of children: Not on file  . Years of education: Not on file  . Highest education level: Not on file  Occupational History  . Occupation: retired    Comment: Nutritional therapist  Tobacco Use  . Smoking status: Former Smoker    Types: Cigarettes, Cigars    Quit date: 09/07/2010    Years since quitting: 9.2  . Smokeless tobacco: Never Used  Substance and Sexual Activity  . Alcohol use: Yes    Comment: 1 can beer/day  . Drug use: Never  . Sexual activity: Not Currently  Other Topics Concern  . Not on file  Social History Narrative   Lives in [place]; quit smoking in 2012; ocassional alcohol; Camera operator; wife; 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    Social Determinants of Health   Financial Resource Strain:   . Difficulty of Paying Living  Expenses:   Food Insecurity:   . Worried About Charity fundraiser in the Last Year:   . Arboriculturist in the Last Year:   Transportation Needs:   . Film/video editor (Medical):   Marland Kitchen Lack of Transportation (Non-Medical):   Physical Activity:   . Days of Exercise per Week:   . Minutes of Exercise  per Session:   Stress:   . Feeling of Stress :   Social Connections:   . Frequency of Communication with Friends and Family:   . Frequency of Social Gatherings with Friends and Family:   . Attends Religious Services:   . Active Member of Clubs or Organizations:   . Attends Archivist Meetings:   Marland Kitchen Marital Status:   Intimate Partner Violence:   . Fear of Current or Ex-Partner:   . Emotionally Abused:   Marland Kitchen Physically Abused:   . Sexually Abused:     FAMILY HISTORY: Family History  Problem Relation Age of Onset  . Heart disease Maternal Grandfather   . Diabetes Paternal Grandfather   . Breast cancer Daughter     ALLERGIES:  is allergic to influenza vaccines.  MEDICATIONS:  Current Outpatient Medications  Medication Sig Dispense Refill  . abiraterone acetate (ZYTIGA) 250 MG tablet TAKE 4 TABLETS (1,000 MG TOTAL) BY MOUTH DAILY. TAKE ON AN EMPTY STOMACH 1 HOUR BEFORE OR 2 HOURS AFTER A MEAL 120 tablet 4  . apixaban (ELIQUIS) 5 MG TABS tablet Take 1 tablet (5 mg total) by mouth 2 (two) times daily. 60 tablet 1  . carbidopa-levodopa (SINEMET IR) 25-100 MG tablet Take 2 tablets by mouth 4 (four) times daily. TAKE 2 tablets at 7AM, 2 tablet at 11AM, 2 tablets at 3PM, 1 tablet at 7PM    . clonazePAM (KLONOPIN) 0.5 MG tablet Take 0.5 mg by mouth at bedtime. Dr Lisabeth Devoid    . diltiazem (CARDIZEM CD) 120 MG 24 hr capsule Take 1 capsule (120 mg total) by mouth daily. 30 capsule 2  . Glycerin-Hypromellose-PEG 400 (VISINE DRY EYE OP) Apply to eye. otc    . ketoconazole (NIZORAL) 2 % shampoo Apply 1 application topically 2 (two) times a week.     . predniSONE (DELTASONE) 10 MG tablet Take 1 tablet (10 mg total) by mouth daily with breakfast. 30 tablet 6  . tamsulosin (FLOMAX) 0.4 MG CAPS capsule Take 1 capsule (0.4 mg total) by mouth daily. (Patient taking differently: Take 0.4 mg by mouth daily. [place]) 90 capsule 3   No current facility-administered medications for this visit.       Marland Kitchen  PHYSICAL EXAMINATION: ECOG PERFORMANCE STATUS: 0 - Asymptomatic  Vitals:   11/21/19 1407  BP: 109/69  Pulse: (!) 54  Resp: 20  Temp: (!) 97.1 F (36.2 C)   Filed Weights   11/21/19 1407  Weight: 167 lb 8 oz (76 kg)    Physical Exam  Constitutional: He is oriented to person, place, and time and well-developed, well-nourished, and in no distress.  HENT:  Head: Normocephalic and atraumatic.  Mouth/Throat: Oropharynx is clear and moist. No oropharyngeal exudate.  Eyes: Pupils are equal, round, and reactive to light.  Cardiovascular: Normal rate and regular rhythm.  Pulmonary/Chest: No respiratory distress. He has no wheezes.  Abdominal: Soft. Bowel sounds are normal. He exhibits no distension and no mass. There is no abdominal tenderness. There is no rebound and no guarding.  Musculoskeletal:        General: No tenderness or edema. Normal  range of motion.     Cervical back: Normal range of motion and neck supple.  Neurological: He is alert and oriented to person, place, and time.  Tremor of right upper extremity.  Skin: Skin is warm.  Psychiatric: Affect normal.     LABORATORY DATA:  I have reviewed the data as listed Lab Results  Component Value Date   WBC 8.9 11/17/2019   HGB 13.6 11/17/2019   HCT 41.2 11/17/2019   MCV 96.5 11/17/2019   PLT 217 11/17/2019   Recent Labs    09/11/19 1032 10/23/19 1056 11/17/19 1347  NA 140 142 140  K 3.6 4.1 3.9  CL 106 103 106  CO2 25 30 23   GLUCOSE 126* 113* 133*  BUN 22 18 15   CREATININE 1.46* 1.69* 1.57*  CALCIUM 10.2 10.3 9.6  GFRNONAA 45* 38* 42*  GFRAA 53* 44* 48*  PROT 6.9 6.9 7.1  ALBUMIN 4.0 4.1 4.1  AST 28 35 36  ALT 7 6 16   ALKPHOS 52 64 61  BILITOT 2.5* 1.4* 1.4*    RADIOGRAPHIC STUDIES: I have personally reviewed the radiological images as listed and agreed with the findings in the report. No results found.  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 On Lupron [urology/ q38m;last 10/2019] + Zytiga+prednsione. FEB 2021- PSA- 0.8 trending down; STABLE.  # continue Zytiga+prednisone; Labs today reviewed.  Discussed the prognosis of metastatic prostate cancer is overall incurable.  However median survival is in the order of 5 years.  Given the patient has oligometastatic disease/and had a good response to current therapy-life expectancy in the order of years.  #A. Fib-Eliquis- STABLE.   # Difficulty with urination-on flomax;-STABLE.   #CKD stage III-creatinine 1.58; worse- GFR ~43-overall stable.  # HOt flashes-grade 1- STABLE.   # DISPOSITION: # follow up in 6 weeks-MD; Labs-1-2 day prior- cbc/cmp/PSA;-Dr.B   All questions were answered. The patient knows to call the clinic with any problems, questions or concerns.    Cammie Sickle, MD 11/21/2019 5:29 PM

## 2019-11-22 ENCOUNTER — Encounter: Payer: Self-pay | Admitting: Family Medicine

## 2019-11-22 ENCOUNTER — Other Ambulatory Visit: Payer: Self-pay

## 2019-11-22 ENCOUNTER — Ambulatory Visit (INDEPENDENT_AMBULATORY_CARE_PROVIDER_SITE_OTHER): Payer: Medicare Other | Admitting: Family Medicine

## 2019-11-22 VITALS — BP 100/60 | HR 62 | Ht 69.0 in | Wt 165.0 lb

## 2019-11-22 DIAGNOSIS — M7989 Other specified soft tissue disorders: Secondary | ICD-10-CM | POA: Diagnosis not present

## 2019-11-22 NOTE — Progress Notes (Signed)
Date:  11/22/2019   Name:  [name] [name]   DOB:  [birth year]   MRN:  [id number]   Chief Complaint: lump in neck (in the front- noticed last night- feels "firm")  Patient is a 79 year old male who presents for a comprehensive physical exam. Patient has history of stage 4 prostate cancer. The patient reports the following problems: palpable mass/superficial right anterior neck.. Health maintenance has been reviewed up to date.   Lab Results  Component Value Date   CREATININE 1.57 (H) 11/17/2019   BUN 15 11/17/2019   NA 140 11/17/2019   K 3.9 11/17/2019   CL 106 11/17/2019   CO2 23 11/17/2019   Lab Results  Component Value Date   CHOL 216 (H) 08/17/2019   HDL 72 08/17/2019   LDLCALC 124 (H) 08/17/2019   TRIG 98 08/17/2019   CHOLHDL 3.0 08/17/2019   Lab Results  Component Value Date   TSH 1.217 08/16/2019   No results found for: HGBA1C Lab Results  Component Value Date   WBC 8.9 11/17/2019   HGB 13.6 11/17/2019   HCT 41.2 11/17/2019   MCV 96.5 11/17/2019   PLT 217 11/17/2019   Lab Results  Component Value Date   ALT 16 11/17/2019   AST 36 11/17/2019   ALKPHOS 61 11/17/2019   BILITOT 1.4 (H) 11/17/2019     Review of Systems  Constitutional: Negative for chills and fever.  HENT: Negative for drooling, ear discharge, ear pain and sore throat.   Respiratory: Negative for cough, shortness of breath and wheezing.   Cardiovascular: Negative for chest pain, palpitations and leg swelling.  Gastrointestinal: Negative for abdominal pain, blood in stool, constipation, diarrhea and nausea.  Endocrine: Negative for polydipsia.  Genitourinary: Negative for dysuria, frequency, hematuria and urgency.  Musculoskeletal: Negative for back pain, myalgias and neck pain.  Skin: Negative for rash.  Allergic/Immunologic: Negative for environmental allergies.  Neurological: Negative for dizziness and headaches.  Hematological: Does not bruise/bleed easily.    Psychiatric/Behavioral: Negative for suicidal ideas. The patient is not nervous/anxious.     Patient Active Problem List   Diagnosis Date Noted  . Atrial flutter with rapid ventricular response ([place]) 08/18/2019  . New onset atrial flutter ([place]) 08/16/2019  . Goals of care, counseling/discussion 07/17/2019  . Prostate cancer metastatic to bone ([place]) 05/24/2019  . Establishing care with new doctor, encounter for 09/26/2018  . 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 Parkinson's disease ([place]) 11/07/2015  . 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 Hx of colonic polyps 05/18/2014  . BPH (benign prostatic hyperplasia) 01/12/2014  . Sleep apnea treated with nocturnal BiPAP 01/17/2012  . 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 Influenza Vaccines Anaphylaxis    Flu shot: 1974 anaphylaxis. 2nd time swollen arm Flu shot: 1974 anaphylaxis. 2nd time swollen arm     Past Surgical History:  Procedure Laterality Date  . APPENDECTOMY    . 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 Smoking status: Former Smoker    Types: Cigarettes, Cigars    Quit date: 09/07/2010    Years since quitting: 9.2  . Smokeless tobacco: Never Used  Substance Use Topics  . Alcohol use: Yes    Comment: 1 can beer/day  . Drug use: Never     Medication list has been reviewed and updated.  Current Meds  Medication Sig  . abiraterone acetate (ZYTIGA)  250 MG tablet TAKE 4 TABLETS (1,000 MG TOTAL) BY MOUTH DAILY. TAKE ON AN EMPTY STOMACH 1 HOUR BEFORE OR 2 HOURS AFTER A MEAL  . apixaban (ELIQUIS) 5 MG TABS tablet Take 1 tablet (5 mg total) by mouth 2 (two) times daily.  . carbidopa-levodopa (SINEMET IR) 25-100 MG tablet Take 2 tablets by mouth 4 (four) times daily. TAKE 2 tablets at 7AM, 2 tablet at 11AM, 2 tablets at 3PM, 1 tablet at 7PM  . clonazePAM (KLONOPIN) 0.5 MG tablet Take 0.5 mg by mouth at bedtime. Dr Lisabeth Devoid  . diltiazem (CARDIZEM  CD) 120 MG 24 hr capsule Take 1 capsule (120 mg total) by mouth daily.  . Glycerin-Hypromellose-PEG 400 (VISINE DRY EYE OP) Apply to eye. otc  . ketoconazole (NIZORAL) 2 % shampoo Apply 1 application topically 2 (two) times a week.   . predniSONE (DELTASONE) 10 MG tablet Take 1 tablet (10 mg total) by mouth daily with breakfast.  . tamsulosin (FLOMAX) 0.4 MG CAPS capsule Take 1 capsule (0.4 mg total) by mouth daily. (Patient taking differently: Take 0.4 mg by mouth daily. [place])    PHQ 2/9 Scores 11/22/2019 08/16/2019 03/16/2019 01/11/2019  PHQ - 2 Score 0 0 0 0  PHQ- 9 Score 0 0 0 0    BP Readings from Last 3 Encounters:  11/22/19 100/60  11/21/19 109/69  10/24/19 111/74    Physical Exam Vitals and nursing note reviewed.  HENT:     Head: Normocephalic.     Right Ear: External ear normal.     Left Ear: External ear normal.     Nose: Nose normal.  Eyes:     General: No scleral icterus.       Right eye: No discharge.        Left eye: No discharge.     Conjunctiva/sclera: Conjunctivae normal.     Pupils: Pupils are equal, round, and reactive to light.  Neck:     Thyroid: No thyroid mass, thyromegaly or thyroid tenderness.     Vascular: Normal carotid pulses. No carotid bruit, hepatojugular reflux or JVD.     Trachea: Trachea and phonation normal. No tracheal deviation.      Comments: 3x3 cm palpable, soft, nontendermass that transilluminates. Cystic or soft tissue. Cardiovascular:     Rate and Rhythm: Normal rate and regular rhythm.     Heart sounds: Normal heart sounds. No murmur. No friction rub. No gallop.   Pulmonary:     Effort: No respiratory distress.     Breath sounds: Normal breath sounds. No wheezing or rales.  Abdominal:     General: Bowel sounds are normal.     Palpations: Abdomen is soft. There is no mass.     Tenderness: There is no abdominal tenderness. There is no guarding or rebound.  Musculoskeletal:        General: No tenderness. Normal range of motion.      Cervical back: Full passive range of motion without pain, normal range of motion and neck supple. No erythema. Normal range of motion.  Lymphadenopathy:     Cervical: No cervical adenopathy.     Right cervical: No superficial, deep or posterior cervical adenopathy.    Left cervical: No superficial, deep or posterior cervical adenopathy.  Skin:    General: Skin is warm.     Findings: No rash.  Neurological:     Mental Status: He is alert and oriented to person, place, and time.     Cranial Nerves: No cranial nerve  deficit.     Deep Tendon Reflexes: Reflexes are normal and symmetric.     Wt Readings from Last 3 Encounters:  11/22/19 165 lb (74.8 kg)  11/21/19 167 lb 8 oz (76 kg)  10/24/19 166 lb 12.8 oz (75.7 kg)    BP 100/60   Pulse 62   Ht 5\' 9"  (1.753 m)   Wt 165 lb (74.8 kg)   BMI 24.37 kg/m   Assessment and Plan:  1. Mass of soft tissue New onset.  Complicated.  Palpable soft 3 x 3 cm mass right anterior cervical, nontender which suddenly appeared.  Area seems to transilluminate which means it is either fluid-filled versus low-density soft tissue like a lipoma.  Will ultrasound  to determine its consistency.  This was discussed with Dr. Dorthula Perfect given the patient's history of metastatic prostate cancer.- US Soft Tissue Head/Neck (NON-THYROID); Future  I spent 60 minutes with this patient, More than 50% of that time was spent in face to face education, counseling and care coordination.

## 2019-11-23 ENCOUNTER — Ambulatory Visit: Payer: Medicare Other

## 2019-11-23 ENCOUNTER — Other Ambulatory Visit: Payer: Self-pay

## 2019-11-23 ENCOUNTER — Ambulatory Visit
Admission: RE | Admit: 2019-11-23 | Discharge: 2019-11-23 | Disposition: A | Discharge status: Home / Self Care | Payer: Medicare Other | Source: Ambulatory Visit | Attending: Family Medicine | Admitting: Family Medicine

## 2019-11-23 DIAGNOSIS — M7989 Other specified soft tissue disorders: Secondary | ICD-10-CM

## 2019-11-23 DIAGNOSIS — R221 Localized swelling, mass and lump, neck: Secondary | ICD-10-CM | POA: Diagnosis not present

## 2019-12-01 DIAGNOSIS — M778 Other enthesopathies, not elsewhere classified: Secondary | ICD-10-CM | POA: Diagnosis not present

## 2019-12-19 MED FILL — ABIRATERONE ACETATE 250 MG: 250 | 30 days supply | Qty: 120 | Fill #1

## 2019-12-19 MED FILL — predniSONE 10 MG TABS: 10 | 30 days supply | Qty: 30 | Fill #6

## 2019-12-25 ENCOUNTER — Ambulatory Visit: Payer: Medicare Other | Admitting: Neurology

## 2019-12-28 DIAGNOSIS — I483 Typical atrial flutter: Secondary | ICD-10-CM | POA: Diagnosis not present

## 2019-12-28 DIAGNOSIS — R002 Palpitations: Secondary | ICD-10-CM | POA: Diagnosis not present

## 2019-12-28 DIAGNOSIS — Z9989 Dependence on other enabling machines and devices: Secondary | ICD-10-CM | POA: Diagnosis not present

## 2019-12-28 DIAGNOSIS — G4733 Obstructive sleep apnea (adult) (pediatric): Secondary | ICD-10-CM | POA: Diagnosis not present

## 2019-12-29 DIAGNOSIS — R002 Palpitations: Secondary | ICD-10-CM | POA: Insufficient documentation

## 2020-01-01 ENCOUNTER — Inpatient Hospital Stay: Payer: Medicare Other | Attending: Internal Medicine

## 2020-01-01 ENCOUNTER — Other Ambulatory Visit: Payer: Self-pay

## 2020-01-01 DIAGNOSIS — Z8249 Family history of ischemic heart disease and other diseases of the circulatory system: Secondary | ICD-10-CM | POA: Diagnosis not present

## 2020-01-01 DIAGNOSIS — Z87891 Personal history of nicotine dependence: Secondary | ICD-10-CM | POA: Insufficient documentation

## 2020-01-01 DIAGNOSIS — N183 Chronic kidney disease, stage 3 unspecified: Secondary | ICD-10-CM | POA: Diagnosis not present

## 2020-01-01 DIAGNOSIS — Z79899 Other long term (current) drug therapy: Secondary | ICD-10-CM | POA: Insufficient documentation

## 2020-01-01 DIAGNOSIS — G4730 Sleep apnea, unspecified: Secondary | ICD-10-CM | POA: Insufficient documentation

## 2020-01-01 DIAGNOSIS — Z803 Family history of malignant neoplasm of breast: Secondary | ICD-10-CM | POA: Diagnosis not present

## 2020-01-01 DIAGNOSIS — C61 Malignant neoplasm of prostate: Secondary | ICD-10-CM | POA: Insufficient documentation

## 2020-01-01 DIAGNOSIS — K5900 Constipation, unspecified: Secondary | ICD-10-CM | POA: Diagnosis not present

## 2020-01-01 DIAGNOSIS — Z833 Family history of diabetes mellitus: Secondary | ICD-10-CM | POA: Insufficient documentation

## 2020-01-01 DIAGNOSIS — Z7901 Long term (current) use of anticoagulants: Secondary | ICD-10-CM | POA: Diagnosis not present

## 2020-01-01 DIAGNOSIS — R232 Flushing: Secondary | ICD-10-CM | POA: Diagnosis not present

## 2020-01-01 DIAGNOSIS — G20 Parkinson's disease: Secondary | ICD-10-CM | POA: Diagnosis not present

## 2020-01-01 DIAGNOSIS — C7951 Secondary malignant neoplasm of bone: Secondary | ICD-10-CM | POA: Diagnosis not present

## 2020-01-01 LAB — CBC WITH DIFFERENTIAL/PLATELET
Abs Immature Granulocytes: 0.05 10*3/uL (ref 0.00–0.07)
Basophils Absolute: 0 10*3/uL (ref 0.0–0.1)
Basophils Relative: 1 %
Eosinophils Absolute: 0 10*3/uL (ref 0.0–0.5)
Eosinophils Relative: 0 %
HCT: 43.7 % (ref 39.0–52.0)
Hemoglobin: 14.2 g/dL (ref 13.0–17.0)
Immature Granulocytes: 1 %
Lymphocytes Relative: 11 %
Lymphs Abs: 0.9 10*3/uL (ref 0.7–4.0)
MCH: 31.2 pg (ref 26.0–34.0)
MCHC: 32.5 g/dL (ref 30.0–36.0)
MCV: 96 fL (ref 80.0–100.0)
Monocytes Absolute: 0.4 10*3/uL (ref 0.1–1.0)
Monocytes Relative: 4 %
Neutro Abs: 7 10*3/uL (ref 1.7–7.7)
Neutrophils Relative %: 83 %
Platelets: 237 10*3/uL (ref 150–400)
RBC: 4.55 MIL/uL (ref 4.22–5.81)
RDW: 13.1 % (ref 11.5–15.5)
WBC: 8.4 10*3/uL (ref 4.0–10.5)
nRBC: 0 % (ref 0.0–0.2)

## 2020-01-01 LAB — COMPREHENSIVE METABOLIC PANEL
ALT: 13 U/L (ref 0–44)
AST: 32 U/L (ref 15–41)
Albumin: 4.2 g/dL (ref 3.5–5.0)
Alkaline Phosphatase: 67 U/L (ref 38–126)
Anion gap: 11 (ref 5–15)
BUN: 19 mg/dL (ref 8–23)
CO2: 23 mmol/L (ref 22–32)
Calcium: 9.6 mg/dL (ref 8.9–10.3)
Chloride: 106 mmol/L (ref 98–111)
Creatinine, Ser: 1.5 mg/dL — ABNORMAL HIGH (ref 0.61–1.24)
GFR calc Af Amer: 51 mL/min — ABNORMAL LOW (ref >60)
GFR calc non Af Amer: 44 mL/min — ABNORMAL LOW (ref >60)
Glucose, Bld: 138 mg/dL — ABNORMAL HIGH (ref 70–99)
Potassium: 3.9 mmol/L (ref 3.5–5.1)
Sodium: 140 mmol/L (ref 135–145)
Total Bilirubin: 1.6 mg/dL — ABNORMAL HIGH (ref 0.3–1.2)
Total Protein: 7.2 g/dL (ref 6.5–8.1)

## 2020-01-01 LAB — PSA: Prostatic Specific Antigen: 0.68 ng/mL (ref 0.00–4.00)

## 2020-01-03 ENCOUNTER — Other Ambulatory Visit: Payer: Self-pay

## 2020-01-03 ENCOUNTER — Encounter: Payer: Self-pay | Admitting: Internal Medicine

## 2020-01-03 ENCOUNTER — Inpatient Hospital Stay (HOSPITAL_BASED_OUTPATIENT_CLINIC_OR_DEPARTMENT_OTHER): Payer: Medicare Other | Admitting: Internal Medicine

## 2020-01-03 DIAGNOSIS — G4730 Sleep apnea, unspecified: Secondary | ICD-10-CM | POA: Diagnosis not present

## 2020-01-03 DIAGNOSIS — C61 Malignant neoplasm of prostate: Secondary | ICD-10-CM

## 2020-01-03 DIAGNOSIS — K5900 Constipation, unspecified: Secondary | ICD-10-CM | POA: Diagnosis not present

## 2020-01-03 DIAGNOSIS — G20 Parkinson's disease: Secondary | ICD-10-CM | POA: Diagnosis not present

## 2020-01-03 DIAGNOSIS — N183 Chronic kidney disease, stage 3 unspecified: Secondary | ICD-10-CM | POA: Diagnosis not present

## 2020-01-03 DIAGNOSIS — C7951 Secondary malignant neoplasm of bone: Secondary | ICD-10-CM

## 2020-01-03 NOTE — Progress Notes (Signed)
Pt in for follow up, reports started new med for muscular pain in back, Robaxin.

## 2020-01-03 NOTE — Assessment & Plan Note (Addendum)
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 On Lupron [urology/ q17m;last 10/2019] + Zytiga+prednsione.  April 2021-0.68 trending down; STABLE.   # continue Zytiga+prednisone; Labs today reviewed.   #A. Fib-Eliquis- stable.    #CKD stage III- stable;   # HOt flashes-grade 1- STABLE.   # DISPOSITION: # follow up in 6 weeks-MD; Labs-1-2 day prior- cbc/cmp/PSA;-Dr.B

## 2020-01-03 NOTE — Progress Notes (Signed)
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 Sclerotic 1.5 cm left ischial lesion/ Sclerotic medial left iliac bone 1.5 cm lesion (series 2/image 47), increased from 1.0 cm. Gleason score of 4+5= 9; with almost all cores involved greater than 80%.  9/16 Lupron 67-month depot on 9/16. [Urology; Dr.Siniski]  # MID OCT 2020- Zytiga 1000 mg+ prednisone  # Parkinsons's syndrome [Dr.Tat; [place]; neurologist]; CKD-III [creat1.3-1.5]  # GC- referred  # DECLINES- Palliative care [316/2021]  DIAGNOSIS: Prostate cancer  STAGE:     4    ;  GOALS: Palliative/control  CURRENT/MOST RECENT THERAPY : Lupron+ Zytiga      Prostate cancer metastatic to bone ([place])  05/24/2019 Initial Diagnosis   Prostate cancer metastatic to bone ([facility])    HISTORY OF PRESENTING ILLNESS:  [name] [name] 79 y.o.  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  Patient denies any further episodes of hospitalizations.  Denies any further episodes of A. fib or palpitations.  Denies any swelling in the legs.  No cramping.  No nausea no vomiting.  No diarrhea.  Review of Systems  Constitutional: Negative for chills, diaphoresis, fever, malaise/fatigue and weight loss.  HENT: Negative for nosebleeds and sore throat.   Eyes: Negative for double vision.  Respiratory: Negative for cough, hemoptysis, sputum production, shortness of breath and wheezing.   Cardiovascular: Negative for chest pain, palpitations, orthopnea and leg swelling.  Gastrointestinal: Positive for constipation.  Negative for abdominal pain, blood in stool, diarrhea, heartburn, melena, nausea and vomiting.  Genitourinary: Positive for frequency and urgency. Negative for dysuria.  Musculoskeletal: Negative for back pain and joint pain.  Skin: Negative.  Negative for itching and rash.  Neurological: Positive for tremors. Negative for dizziness, tingling, focal weakness, weakness and headaches.  Endo/Heme/Allergies: Does not bruise/bleed easily.  Psychiatric/Behavioral: Negative for depression. The patient is not nervous/anxious and does not have insomnia.     MEDICAL HISTORY:  Past Medical History:  Diagnosis Date  . Parkinson's disease ([place])   . REM sleep behavior disorder   . Sleep apnea     SURGICAL HISTORY: Past Surgical History:  Procedure Laterality Date  . APPENDECTOMY    . 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 Marital status: Married    Spouse name: Not on file  . Number of children: Not on file  . Years of education: Not on file  . Highest education level: Not on file  Occupational History  . Occupation: retired    Comment: Nutritional therapist  Tobacco Use  . Smoking status: Former Smoker    Types: Cigarettes, Cigars    Quit date: 09/07/2010    Years since quitting: 9.3  . Smokeless tobacco: Never Used  Substance and Sexual Activity  . Alcohol use: Yes    Comment: 1 can beer/day  . Drug use: Never  . Sexual activity: Not Currently  Other Topics Concern  . Not on file  Social History Narrative   Lives in [place]; quit smoking in 2012; ocassional alcohol; Camera operator; wife; 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    Social Determinants of Health  Financial Resource Strain:   . Difficulty of Paying Living Expenses:   Food Insecurity:   . Worried About Charity fundraiser in the Last Year:   . Arboriculturist in the Last Year:   Transportation Needs:   . Film/video editor (Medical):   Marland Kitchen Lack of Transportation  (Non-Medical):   Physical Activity:   . Days of Exercise per Week:   . Minutes of Exercise per Session:   Stress:   . Feeling of Stress :   Social Connections:   . Frequency of Communication with Friends and Family:   . Frequency of Social Gatherings with Friends and Family:   . Attends Religious Services:   . Active Member of Clubs or Organizations:   . Attends Archivist Meetings:   Marland Kitchen Marital Status:   Intimate Partner Violence:   . Fear of Current or Ex-Partner:   . Emotionally Abused:   Marland Kitchen Physically Abused:   . Sexually Abused:     FAMILY HISTORY: Family History  Problem Relation Age of Onset  . Heart disease Maternal Grandfather   . Diabetes Paternal Grandfather   . Breast cancer Daughter     ALLERGIES:  is allergic to influenza vaccines.  MEDICATIONS:  Current Outpatient Medications  Medication Sig Dispense Refill  . abiraterone acetate (ZYTIGA) 250 MG tablet TAKE 4 TABLETS (1,000 MG TOTAL) BY MOUTH DAILY. TAKE ON AN EMPTY STOMACH 1 HOUR BEFORE OR 2 HOURS AFTER A MEAL 120 tablet 4  . apixaban (ELIQUIS) 5 MG TABS tablet Take 1 tablet (5 mg total) by mouth 2 (two) times daily. 60 tablet 1  . carbidopa-levodopa (SINEMET IR) 25-100 MG tablet Take 2 tablets by mouth 4 (four) times daily. TAKE 2 tablets at 7AM, 2 tablet at 11AM, 2 tablets at 3PM, 1 tablet at 7PM    . clonazePAM (KLONOPIN) 0.5 MG tablet Take 0.5 mg by mouth at bedtime. Dr Lisabeth Devoid    . diltiazem (CARDIZEM CD) 120 MG 24 hr capsule Take 1 capsule (120 mg total) by mouth daily. 30 capsule 2  . Glycerin-Hypromellose-PEG 400 (VISINE DRY EYE OP) Apply to eye. otc    . ketoconazole (NIZORAL) 2 % shampoo Apply 1 application topically 2 (two) times a week.     . methocarbamol (ROBAXIN) 500 MG tablet 1/2-1 po qHS prn    . predniSONE (DELTASONE) 10 MG tablet Take 1 tablet (10 mg total) by mouth daily with breakfast. 30 tablet 6  . tamsulosin (FLOMAX) 0.4 MG CAPS capsule Take 1 capsule (0.4 mg total) by mouth  daily. (Patient taking differently: Take 0.4 mg by mouth daily. [place]) 90 capsule 3   No current facility-administered medications for this visit.      Marland Kitchen  PHYSICAL EXAMINATION: ECOG PERFORMANCE STATUS: 0 - Asymptomatic  Vitals:   01/03/20 1100  BP: 116/71  Pulse: (!) 56  Resp: 18  Temp: (!) 96.8 F (36 C)   Filed Weights   01/03/20 1100  Weight: 167 lb 6.4 oz (75.9 kg)    Physical Exam  Constitutional: He is oriented to person, place, and time and well-developed, well-nourished, and in no distress.  HENT:  Head: Normocephalic and atraumatic.  Mouth/Throat: Oropharynx is clear and moist. No oropharyngeal exudate.  Eyes: Pupils are equal, round, and reactive to light.  Cardiovascular: Normal rate and regular rhythm.  Pulmonary/Chest: No respiratory distress. He has no wheezes.  Abdominal: Soft. Bowel sounds are normal. He exhibits no distension and no mass. There is no abdominal  tenderness. There is no rebound and no guarding.  Musculoskeletal:        General: No tenderness or edema. Normal range of motion.     Cervical back: Normal range of motion and neck supple.  Neurological: He is alert and oriented to person, place, and time.  Tremor of right upper extremity.  Skin: Skin is warm.  Psychiatric: Affect normal.     LABORATORY DATA:  I have reviewed the data as listed Lab Results  Component Value Date   WBC 8.4 01/01/2020   HGB 14.2 01/01/2020   HCT 43.7 01/01/2020   MCV 96.0 01/01/2020   PLT 237 01/01/2020   Recent Labs    10/23/19 1056 11/17/19 1347 01/01/20 1438  NA 142 140 140  K 4.1 3.9 3.9  CL 103 106 106  CO2 30 23 23   GLUCOSE 113* 133* 138*  BUN 18 15 19   CREATININE 1.69* 1.57* 1.50*  CALCIUM 10.3 9.6 9.6  GFRNONAA 38* 42* 44*  GFRAA 44* 48* 51*  PROT 6.9 7.1 7.2  ALBUMIN 4.1 4.1 4.2  AST 35 36 32  ALT 6 16 13   ALKPHOS 64 61 67  BILITOT 1.4* 1.4* 1.6*    RADIOGRAPHIC STUDIES: I have personally reviewed the radiological images as  listed and agreed with the findings in the report. No results found.  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 On Lupron [urology/ q34m;last 10/2019] + Zytiga+prednsione.  April 2021-0.68 trending down; STABLE.   # continue Zytiga+prednisone; Labs today reviewed.   #A. Fib-Eliquis- stable.    #CKD stage III- stable;   # HOt flashes-grade 1- STABLE.   # DISPOSITION: # follow up in 6 weeks-MD; Labs-1-2 day prior- cbc/cmp/PSA;-Dr.B   All questions were answered. The patient knows to call the clinic with any problems, questions or concerns.    Cammie Sickle, MD 01/09/2020 8:51 AM

## 2020-01-10 ENCOUNTER — Other Ambulatory Visit: Payer: Self-pay | Admitting: Internal Medicine

## 2020-01-10 DIAGNOSIS — C61 Malignant neoplasm of prostate: Secondary | ICD-10-CM

## 2020-01-11 MED FILL — predniSONE 10 MG TABS: 10 | 30 days supply | Qty: 30 | Fill #0

## 2020-01-18 ENCOUNTER — Other Ambulatory Visit: Payer: Self-pay

## 2020-01-18 ENCOUNTER — Ambulatory Visit: Payer: Medicare Other | Attending: Family Medicine | Admitting: Physical Therapy

## 2020-01-18 ENCOUNTER — Ambulatory Visit: Payer: Medicare Other

## 2020-01-18 ENCOUNTER — Ambulatory Visit: Payer: Medicare Other | Admitting: Occupational Therapy

## 2020-01-18 DIAGNOSIS — R278 Other lack of coordination: Secondary | ICD-10-CM | POA: Insufficient documentation

## 2020-01-18 DIAGNOSIS — R471 Dysarthria and anarthria: Secondary | ICD-10-CM

## 2020-01-18 DIAGNOSIS — R2689 Other abnormalities of gait and mobility: Secondary | ICD-10-CM | POA: Insufficient documentation

## 2020-01-18 NOTE — Therapy (Signed)
[facility] [address] [place], [place], [address] Phone: [phone]   Fax:  [phone]  Patient Details  Name: [name] [name] MRN: [id number] Date of Birth: June 26, 1941 Referring Provider:  Juline Patch, MD  Encounter Date: 01/18/2020 Occupational Therapy Parkinson's Disease Scree  Physical Performance Test item #2 (simulated eating):  21.91 sec  Fastening/unfastening 3 buttons in:  1 min 21sec  9-hole peg test:    RUE  47.91 sec        LUE  36.09 sec  Change in ability to perform ADLs/IADLs:  Slowed ADLs, decreased coordination   Pt can benefit from skilled occupational therapy due to a decline in ADLs, and decreased coordination. [name] 01/18/2020, [facility] [address] [place], [place], [address] Phone: [phone]   Fax:  [phone]

## 2020-01-18 NOTE — Therapy (Signed)
[facility] [address] [place], [place], [address] Phone: [phone]   Fax:  [phone]  Patient Details  Name: [name] [name] MRN: [id number] Date of Birth: [birth year] Referring Provider:  Juline Patch, MD  Encounter Date: 01/18/2020   Physical Therapy Parkinson's Disease Screen   Timed Up and Go test: 11.75 sec  10 meter walk test: 10 sec = 3.28 ft/sec  5 time sit to stand test:  12.97 sec  Patient would benefit from Physical Therapy evaluation due to slowed mobility measures compared to previous screen.         Tyan Dy W. 01/18/2020, 8:08 AM  Frazier Butt., PT   [facility] [address] [place], [place], [address] Phone: [phone]   Fax:  [phone]

## 2020-01-18 NOTE — Therapy (Signed)
[facility] [address] [place], [place], [address] Phone: [phone]   Fax:  [phone]  Patient Details  Name: [name] [name] MRN: [id number] Date of Birth: 08/01/1941 Referring Provider:  Alonza Bogus, DO  Encounter Date: 01/18/2020  Speech Therapy Parkinson's Disease Screen   Decibel Level today: lower 70s dB  (WNL=70-72 dB) with sound level meter 30cm away from pt's mouth. Pt's conversational volume has remained essentially the same since last treatment course. SLP encouraged pt to restart loud "AH" x6, BID.  Pt does not report difficulty in swallowing warranting further evaluation.  Pt does does not require speech therapy services at this time. Recommend ST screen in another 6 months.    [facility] ,[place], [place]  01/18/2020, 8:42 AM  [facility] [address] [place], [place], [address] Phone: [phone]   Fax:  [phone]

## 2020-01-23 NOTE — Progress Notes (Signed)
Assessment/Plan:   1.  Parkinsons Disease  -Continue carbidopa/levodopa 25/100, 2 tablets at 7 AM/11 AM/3 PM/1 tablet at 7 PM  -refer to PT/OT as recommend by therapy screens - referral sent this morning  -start opicapone 50 mg q hs.  R/B/SE were discussed.  The opportunity to ask questions was given and they were answered to the best of my ability.  The patient expressed understanding and willingness to follow the outlined treatment protocols.  2.REM behavior disorder -Continue clonazepam, 0.5 mg at bedtime.  R/B/SE were discussed.  The opportunity to ask questions was given and they were answered to the best of my ability.  The patient expressed understanding and willingness to follow the outlined treatment protocols.  3.Obstructive sleep apnea syndrome. -We discussed morbidity and mortality associated with untreated sleep apnea. The patient expressed understanding. He is on bipap.             -following with [facility] neuro for this   4.   Metastatic prostate cancer with bony mets             -Following with oncology.  5.  A-flutter  -started on elquis  -started on diltiazem - doesn't think that this is contributing to fatigue.  Subjective:   [name] [name] was seen today in follow up for Parkinsons disease.  My previous records were reviewed prior to todays visit as well as outside records available to me.  His levodopa was slightly increased last visit.  He reports that he is doing okay with that but overall feels that he is on the decline.  Part of that is due to lack of energy that he attributes to other meds and part due to loss of dexterity in the R hand.  pt denies falls.  Pt denies lightheadedness, near syncope.  No hallucinations.  Mood has been good.  Patient just had his therapy screens.  Physical and Occupational Therapy were recommended.  He is having more trouble buttoning objects.  Notes that had been dx with a-flutter since last visit and  placed on eliquis and diltiazem.    Current prescribed movement disorder medications: Carbidopa/levodopa 25/100, 2 tablets at 7 AM/2 tablet at 11 AM/2 tablets at 3 PM/1 tablet at 7 PM (increased last visit) Clonazepam 0.5 mg, 1 tablet at bedtime   PREVIOUS MEDICATIONS: azilect, carbidopa/levodopa 25/100; klonopin  ALLERGIES:   Allergies  Allergen Reactions  . Influenza Vaccines Anaphylaxis    Flu shot: 1974 anaphylaxis. 2nd time swollen arm Flu shot: 1974 anaphylaxis. 2nd time swollen arm     CURRENT MEDICATIONS:  Outpatient Encounter Medications as of 01/25/2020  Medication Sig  . abiraterone acetate (ZYTIGA) 250 MG tablet TAKE 4 TABLETS (1,000 MG TOTAL) BY MOUTH DAILY. TAKE ON AN EMPTY STOMACH 1 HOUR BEFORE OR 2 HOURS AFTER A MEAL  . apixaban (ELIQUIS) 5 MG TABS tablet Take 1 tablet (5 mg total) by mouth 2 (two) times daily.  . carbidopa-levodopa (SINEMET IR) 25-100 MG tablet Take 2 tablets by mouth as directed. TAKE 2 tablets at 7AM, 2 tablet at 11AM, 2 tablets at 3PM, 1 tablet at 7PM  . clonazePAM (KLONOPIN) 0.5 MG tablet Take 0.5 mg by mouth at bedtime. Dr Lisabeth Devoid  . diltiazem (CARDIZEM CD) 120 MG 24 hr capsule Take 1 capsule (120 mg total) by mouth daily.  . Glycerin-Hypromellose-PEG 400 (VISINE DRY EYE OP) Apply to eye. otc  . ketoconazole (NIZORAL) 2 % shampoo Apply 1 application topically 2 (two) times a week.   Marland Kitchen  methocarbamol (ROBAXIN) 500 MG tablet 1/2-1 po qHS prn  . predniSONE (DELTASONE) 10 MG tablet TAKE 1 TABLET (10 MG TOTAL) BY MOUTH DAILY WITH BREAKFAST.  . tamsulosin (FLOMAX) 0.4 MG CAPS capsule Take 1 capsule (0.4 mg total) by mouth daily. (Patient taking differently: Take 0.4 mg by mouth daily. [place])   No facility-administered encounter medications on file as of 01/25/2020.    Objective:   PHYSICAL EXAMINATION:    VITALS:   Vitals:   01/25/20 1429  BP: 111/69  Pulse: (!) 57  SpO2: 99%  Weight: 167 lb (75.8 kg)  Height: 5' 8.5" (1.74 m)    GEN:   The patient appears stated age and is in NAD. HEENT:  Normocephalic, atraumatic.  The mucous membranes are moist. The superficial temporal arteries are without ropiness or tenderness. CV:  Loletha Grayer.   Lungs:  CTAB Neck/HEME:  There are no carotid bruits bilaterally.  Neurological examination:  Orientation: The patient is alert and oriented x3. Cranial nerves: There is good facial symmetry with facial hypomimia. The speech is fluent and clear. She is hypophonic.  Soft palate rises symmetrically and there is no tongue deviation. Hearing is intact to conversational tone. Sensation: Sensation is intact to light touch throughout Motor: Strength is at least antigravity x4.  Movement examination: Tone: There is mild to mod increased tone in the RUE  Abnormal movements: none Coordination:  There is decremation with RAM's, with any form of RAMS, including alternating supination and pronation of the forearm, hand opening and closing, finger taps, heel taps and toe taps, R more the L Gait and Station: The patient has no difficulty arising out of a deep-seated chair without the use of the hands. The patient's stride length is good but he drags the heels with ambulation.    I have reviewed and interpreted the following labs independently    Chemistry      Component Value Date/Time   NA 140 01/01/2020 1438   K 3.9 01/01/2020 1438   CL 106 01/01/2020 1438   CO2 23 01/01/2020 1438   BUN 19 01/01/2020 1438   CREATININE 1.50 (H) 01/01/2020 1438      Component Value Date/Time   CALCIUM 9.6 01/01/2020 1438   ALKPHOS 67 01/01/2020 1438   AST 32 01/01/2020 1438   ALT 13 01/01/2020 1438   BILITOT 1.6 (H) 01/01/2020 1438       Lab Results  Component Value Date   WBC 8.4 01/01/2020   HGB 14.2 01/01/2020   HCT 43.7 01/01/2020   MCV 96.0 01/01/2020   PLT 237 01/01/2020    Lab Results  Component Value Date   TSH 1.217 08/16/2019     Total time spent on today's visit was 30 minutes, including  both face-to-face time and nonface-to-face time.  Time included that spent on review of records (prior notes available to me/labs/imaging if pertinent), discussing treatment and goals, answering patient's questions and coordinating care.  Cc:  Juline Patch, MD

## 2020-01-25 ENCOUNTER — Ambulatory Visit (INDEPENDENT_AMBULATORY_CARE_PROVIDER_SITE_OTHER): Payer: Medicare Other | Admitting: Neurology

## 2020-01-25 ENCOUNTER — Other Ambulatory Visit: Payer: Self-pay

## 2020-01-25 ENCOUNTER — Other Ambulatory Visit: Payer: Self-pay | Admitting: Neurology

## 2020-01-25 ENCOUNTER — Encounter: Payer: Self-pay | Admitting: Neurology

## 2020-01-25 VITALS — BP 111/69 | HR 57 | Ht 68.5 in | Wt 167.0 lb

## 2020-01-25 DIAGNOSIS — G20 Parkinson's disease: Secondary | ICD-10-CM

## 2020-01-25 DIAGNOSIS — G4752 REM sleep behavior disorder: Secondary | ICD-10-CM

## 2020-01-25 NOTE — Patient Instructions (Signed)
Start ongentys (opicapone) 50 mg at bedtime.  We will talk with you in a week to see how you are doing and see if we should continue this.  No changes to your other Parkinsons Disease medications.  The physicians and staff at [facility] are committed to providing excellent care. You may receive a survey requesting feedback about your experience at our office. We strive to receive "very good" responses to the survey questions. If you feel that your experience would prevent you from giving the office a "very good " response, please contact our office to try to remedy the situation. We may be reached at [phone]. Thank you for taking the time out of your busy day to complete the survey.

## 2020-02-01 ENCOUNTER — Inpatient Hospital Stay: Payer: Medicare Other | Attending: Oncology | Admitting: Licensed Clinical Social Worker

## 2020-02-02 ENCOUNTER — Telehealth: Payer: Self-pay | Admitting: Neurology

## 2020-02-02 ENCOUNTER — Encounter: Payer: Self-pay | Admitting: Urology

## 2020-02-02 MED ORDER — OPICAPONE 50 MG PO CAPS: 50.0000 mg | ORAL_CAPSULE | Freq: Every day | ORAL | 2 refills | Status: DC

## 2020-02-02 NOTE — Telephone Encounter (Signed)
-----   Message from [place], DO sent at 01/25/2020  3:01 PM EDT ----- Find out how pt doing on opicapone.  If doing okay, start the prior auth process and send to [facility]

## 2020-02-02 NOTE — Telephone Encounter (Signed)
Patient states he has been taking the medication regularly and states he thinks its helping. He states he has enough for two more weeks. He states he thinks he would like to continue taking this medication.  Rx(s) sent to pharmacy electronically to [facility].

## 2020-02-07 ENCOUNTER — Encounter: Payer: Self-pay | Admitting: Occupational Therapy

## 2020-02-07 ENCOUNTER — Ambulatory Visit: Payer: Medicare Other | Attending: Family Medicine | Admitting: Physical Therapy

## 2020-02-07 ENCOUNTER — Other Ambulatory Visit: Payer: Self-pay

## 2020-02-07 ENCOUNTER — Ambulatory Visit: Payer: Medicare Other | Admitting: Occupational Therapy

## 2020-02-07 DIAGNOSIS — R29898 Other symptoms and signs involving the musculoskeletal system: Secondary | ICD-10-CM

## 2020-02-07 DIAGNOSIS — M25621 Stiffness of right elbow, not elsewhere classified: Secondary | ICD-10-CM

## 2020-02-07 DIAGNOSIS — R278 Other lack of coordination: Secondary | ICD-10-CM | POA: Insufficient documentation

## 2020-02-07 DIAGNOSIS — R293 Abnormal posture: Secondary | ICD-10-CM

## 2020-02-07 DIAGNOSIS — R2689 Other abnormalities of gait and mobility: Secondary | ICD-10-CM

## 2020-02-07 DIAGNOSIS — M25622 Stiffness of left elbow, not elsewhere classified: Secondary | ICD-10-CM | POA: Diagnosis not present

## 2020-02-07 DIAGNOSIS — R2681 Unsteadiness on feet: Secondary | ICD-10-CM | POA: Insufficient documentation

## 2020-02-07 DIAGNOSIS — R29818 Other symptoms and signs involving the nervous system: Secondary | ICD-10-CM | POA: Diagnosis not present

## 2020-02-08 NOTE — Therapy (Signed)
[facility] [address] [place], [place], [address] Phone: [phone]   Fax:  [phone]  Physical Therapy Evaluation  Patient Details  Name: [name] [name] MRN: [id number] Date of Birth: 1940-11-10 Referring Provider (PT): Alonza Bogus, DO   Encounter Date: 02/07/2020  PT End of Session - 02/08/20 1340    Visit Number  1    Number of Visits  9    Authorization Type  Medicare/Tricare    Progress Note Due on Visit  10    PT Start Time  [id number]    PT Stop Time  1700    PT Time Calculation (min)  45 min    Activity Tolerance  Patient tolerated treatment well    Behavior During Therapy  [facility] for tasks assessed/performed       Past Medical History:  Diagnosis Date  . Parkinson's disease ([place])   . REM sleep behavior disorder   . Sleep apnea     Past Surgical History:  Procedure Laterality Date  . APPENDECTOMY    . 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   Subjective Assessment - 02/07/20 1617    Subjective  Feel like I'm losing dexterity in R hand.  Physically, don't feel any issues.  Do tend to get tired from medicaion; have had no falls.  Seems to take me longer to get dressed.  Have had some weight gain and feel that has affected my balance.    Pertinent History  Parkinson's disease    Patient Stated Goals  Pt's goal for therapy is to help not drag foot as much.    Currently in Pain?  No/denies         [facility] PT Assessment - 02/07/20 1620      Assessment   Medical Diagnosis  Parkinson's disease    Referring Provider (PT)  Wells Guiles Tat, DO    Onset Date/Surgical Date  01/18/20   PD screen   Hand Dominance  Right      Precautions   Precautions  Fall      Balance Screen   Has the patient fallen in the past 6 months  No    Has the patient had a decrease in activity level because of a fear of falling?   No    Is the patient reluctant to leave their home because of a fear  of falling?   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); enjoys outside work and Engineer, agricultural   Posture/Postural Control  Postural limitations    Postural Limitations  Forward head;Rounded Shoulders      Tone   Assessment Location  Right Lower Extremity;Left Lower Extremity      ROM / Strength   AROM / PROM /  Strength  Strength      Strength   Overall Strength  Within functional limits for tasks performed      Transfers   Transfers  Sit to Stand;Stand to Sit    Sit to Stand  6: Modified independent (Device/Increase time);Without upper extremity assist;From chair/3-in-1    Five time sit to stand comments   9.35    Stand to Sit  6: Modified independent (Device/Increase time);Without upper extremity assist;To chair/3-in-1    Comments  Sometimes, describes difficulty getting up from lower surfaces      Ambulation/Gait   Ambulation/Gait  Yes    Ambulation/Gait Assistance  7: Independent    Assistive device  None    Gait Pattern  Step-through pattern;Decreased arm swing - right;Decreased step length - right;Narrow base of support;Decreased trunk rotation    Ambulation Surface  Level;Indoor    Gait velocity  9.63 sec = 3.41 ft/sec      6 Minute Walk- Baseline   6 Minute Walk- Baseline  yes    BP (mmHg)  111/68    HR (bpm)  (!) 47    02 Sat (%RA)  98 %      6 Minute walk- Post Test   6 Minute Walk Post Test  yes    HR (bpm)  59    02 Sat (%RA)  96 %     Modified Borg Scale for Dyspnea  1- Very mild shortness of breath    Perceived Rate of Exertion (Borg)  9- very light      6 minute walk test results    Aerobic Endurance Distance Walked  1178    Endurance additional comments  Normal values for 61-79 yo age range:  1729 ft      Standardized Balance Assessment   Standardized Balance Assessment  Timed Up and Go Test;Mini-BESTest      Mini-BESTest   Sit To Stand  Normal: Comes to stand without use of hands and stabilizes independently.    Rise to Toes  Normal: Stable for 3 s with maximum height.    Stand on one leg (left)  Moderate: < 20 s   3.12, 5.66   Stand on one leg (right)  Moderate: < 20 s   2.37, 5.62   Stand on one leg - lowest score  1    Compensatory Stepping Correction - Forward  Normal: Recovers independently with a single, large step (second realignement is allowed).    Compensatory Stepping Correction - Backward  Normal: Recovers independently with a single, large step    Compensatory Stepping Correction - Left Lateral  Normal: Recovers independently with 1 step (crossover or lateral OK)    Compensatory Stepping Correction - Right Lateral  Normal: Recovers independently with 1 step (crossover or lateral OK)    Stepping Corredtion Lateral - lowest score  2    Stance - Feet together, eyes open, firm surface   Normal: 30s    Stance - Feet together, eyes closed, foam surface   Normal: 30s    Incline - Eyes Closed  Normal: Stands independently 30s and aligns with gravity    Change in Gait Speed  Normal: Significantly changes walkling speed without imbalance    Walk with head turns - Horizontal  Moderate: performs head turns with reduction in gait speed.    Walk with pivot turns  Moderate:Turns with feet close SLOW (>4 steps) with good balance.    Step over obstacles  Moderate: Steps over box but touches box OR  displays cautious behavior by slowing gait.    Timed UP & GO with Dual Task  Moderate: Dual Task affects either  counting OR walking (>10%) when compared to the TUG without Dual Task.    Mini-BEST total score  23      Timed Up and Go Test   TUG  Normal TUG    Normal TUG (seconds)  10.5    Manual TUG (seconds)  11.28    Cognitive TUG (seconds)  11.84    TUG Comments  Scores > 10% difference between TUG and TUG cognitive indicate difficulty with dual tasking.      RLE Tone   RLE Tone  Mild      LLE Tone   LLE Tone  Within Functional Limits                  Objective measurements completed on examination: See above findings.              PT Education - 02/08/20 1339    Education Details  Eval results, POC    Person(s) Educated  Patient    Methods  Explanation    Comprehension  Verbalized understanding          PT Long Term Goals - 02/08/20 1353      PT LONG TERM GOAL #1   Title  Pt will be independent with HEP to address Parkinson's specific deficits.  TARGET 03/08/2020    Time  4    Period  Weeks    Status  New      PT LONG TERM GOAL #2   Title  Pt will perform at least 4 of 5 reps of sit<>stand transfers from <18 inch surfaces, minimal UE support, no LOB, for improved low surfaces transfers.    Time  4    Period  Weeks    Status  New      PT LONG TERM GOAL #3   Title  Pt will improve 6MWT distance by at least 100 ft, for improved gait efficiency and safety with long distance gait.    Baseline  6MWT:  1178 ft    Time  4    Period  Weeks    Status  New      PT LONG TERM GOAL #4   Title  Pt will improve MiniBESTest score to at least 25/28 for improved balance, decreased fall risk.    Time  4    Period  Weeks    Status  New      PT LONG TERM GOAL #5   Title  Pt will verbalize ongoing fitness plans upon d/c from PT.    Time  4    Period  Weeks    Status  New             Plan - 02/08/20 1344    Clinical Impression Statement  Pt is a 79 year old male who presents to OPPT with history of Parkinson's disease, REM sleep disorder.  He has  consistently been seen for PD screens over the past several years; with recent PD screen in May 2021, pt has demonstrated slowed overall mobility measures.  Since patient has not had physical therapy since 2019 with slowed functional mobility measures, PT recommended PT evaluation at this time.  Pt presents with decreased timing and coordination of gait, decreased arm swing/step length with gait, abnormal posture, bradykinesia, unsteadiness on feet, difficulty with low surface transfers.  He would benefit from short course  of therapy to address the above stated deficits to decrease fall risk and improve overall functional mobility.    Personal Factors and Comorbidities  Comorbidity 2    Comorbidities  Parkinson's disease, REM sleep disorder    Examination-Activity Limitations  Locomotion Level;Transfers    Examination-Participation Restrictions  Community Activity    Stability/Clinical Decision Making  Stable/Uncomplicated    Clinical Decision Making  Low    Rehab Potential  Good    PT Frequency  2x / week    PT Duration  4 weeks   plus eval   PT Treatment/Interventions  ADLs/Self Care Home Management;Gait training;Functional mobility training;Therapeutic activities;Therapeutic exercise;Balance training;Neuromuscular re-education;Patient/family education    PT Next Visit Plan  Initiate/review HEP-focus on PWR! Moves, gait with arm swing, step length; walking as part of HEP; high level balance exercises; sit<>stand from low surfaces    Consulted and Agree with Plan of Care  Patient       Patient will benefit from skilled therapeutic intervention in order to improve the following deficits and impairments:  Abnormal gait, Decreased coordination, Difficulty walking, Decreased balance, Decreased mobility, Postural dysfunction  Visit Diagnosis: Other abnormalities of gait and mobility  Unsteadiness on feet  Abnormal posture  Other symptoms and signs involving the nervous system     Problem  List Patient Active Problem List   Diagnosis Date Noted  . Atrial flutter with rapid ventricular response ([place]) 08/18/2019  . New onset atrial flutter ([place]) 08/16/2019  . Goals of care, counseling/discussion 07/17/2019  . Prostate cancer metastatic to bone ([place]) 05/24/2019  . Establishing care with new doctor, encounter for 09/26/2018  . 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 Parkinson's disease ([place]) 11/07/2015  . 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 Hx of colonic polyps 05/18/2014  . BPH (benign prostatic hyperplasia) 01/12/2014  . Sleep apnea treated with nocturnal BiPAP 01/17/2012  . 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    Keiva Dina W. 02/08/2020, 1:56 PM Frazier Butt., PT  [facility] [address] [place], [place], [address] Phone: [phone]   Fax:  [phone]  Name: [name] [name] MRN: [id number] Date of Birth: May 26, 1941

## 2020-02-09 ENCOUNTER — Encounter: Payer: Self-pay | Admitting: Occupational Therapy

## 2020-02-09 NOTE — Therapy (Signed)
[facility] [address] [place], [place], [address] Phone: [phone]   Fax:  [phone]  Occupational Therapy Evaluation  Patient Details  Name: [name] [name] MRN: [id number] Date of Birth: 06/03/1941 Referring Provider (OT): Dr. Carles Collet   Encounter Date: 02/07/2020  OT End of Session - 02/09/20 0905    Visit Number  1    Number of Visits  9    Date for OT Re-Evaluation  03/17/20    Authorization - Visit Number  1    Authorization - Number of Visits  10    OT Start Time  6948    OT Stop Time  1610    OT Time Calculation (min)  35 min       Past Medical History:  Diagnosis Date  . Parkinson's disease ([place])   . REM sleep behavior disorder   . Sleep apnea     Past Surgical History:  Procedure Laterality Date  . APPENDECTOMY    . 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  Subjective Assessment - 02/09/20 0923    Subjective   Denies pain    Pertinent History  Parkinson's Disease.  PMH:  newly diagnosed with prostate CA    Patient Stated Goals  improve dexterity and fine motor skills    Currently in Pain?  No/denies        [facility] OT Assessment - 02/09/20 0001      Assessment   Medical Diagnosis  Parkinson's disease    Onset Date/Surgical Date  01/18/20    Hand Dominance  Right      Precautions   Precautions  Fall      Balance Screen   Has the patient fallen in the past 6 months  No    Has the patient had a decrease in activity level because of a fear of falling?   No    Is the patient reluctant to leave their home because of a fear of falling?   No      Prior Function   Level of Independence  Independent    Leisure  Enjoys Public librarian); enjoys outside work and gardening      ADL   Eating/Feeding  Modified independent    Grooming  Modified independent    Upper Body Bathing  Modified independent   increased difficultywith washing hair   Lower  Body Bathing  Modified independent    Upper Body Dressing  Increased time;Needs assist for fasteners   shoes and socks are slow   Lower Body Dressing  Increased time    Toilet Transfer  Modified independent    Tub/Shower Transfer  Modified independent    ADL comments  reports difficulty opening containers, donning socks and shoes, fastening buttons, decreased dexterity in right hand      IADL   Shopping  Takes care of all shopping needs independently    [employer] alone or with occasional assistance    Meal Prep  Able to complete simple warm meal prep    Medication Management  Is responsible for taking medication in correct dosages at correct time      Mobility   Mobility Status  Independent      Activity Tolerance   Activity Tolerance Comments  Pt is undergoing chemo and he fatigues quickly      Cognition   Overall Cognitive Status  Within Functional Limits for tasks assessed  Observation/Other Assessments   Other Surveys   Select    Physical Performance Test    Yes    Simulated Eating Comments  21.09 secs   Donning Doffing Jacket Time (seconds)  27.63 secs   Donning Doffing Jacket Comments  3 button/ unbutton 33.88 secs     Posture/Postural Control   Posture/Postural Control  Postural limitations    Postural Limitations  Forward head;Rounded Shoulders      Coordination   Fine Motor Movements are Fluid and Coordinated  No    9 Hole Peg Test  Right;Left    Right 9 Hole Peg Test  39.47    Left 9 Hole Peg Test  31.75    Box and Blocks  RUE 39, LUE 53      ROM / Strength   AROM / PROM / Strength  AROM      AROM   Overall AROM   Deficits    Overall AROM Comments  RUE shoulder flexion 120, elbow ext -20, LUE shoulder flexion 115, elbow ext -20, decreased bilateral supination and decreased bilateral MP ext                           OT Long Term Goals - 02/09/20 0908      OT LONG TERM GOAL #1   Title  I with PD specific  HEP    Time  4    Period  Weeks    Status  New      OT LONG TERM GOAL #2   Title  Pt will demonstrate improved fine motor coordination for ADLs as evidenced by completing 9 hole peg test for RUE in 36 secs or less    Baseline  RUE 39.47, LUE 31.75    Time  4    Period  Weeks    Status  New      OT LONG TERM GOAL #3   Title  Pt will verbalize understanding of adapted strategies for ADLs/IADLs (particularly strategies for donning jacket, opening containers, putting on socks and shoes,  eating, fasten buttons)    Time  4    Period  Weeks    Status  New      OT LONG TERM GOAL #4   Title  Pt will demonstrate improved RUE functional use for ADLs as evidenced by increasing RUE box/ blocks score by 3 blocks    Baseline  RUE 39, LUE 53    Time  4    Period  Weeks    Status  New      OT LONG TERM GOAL #5   Title  Pt will demonstrate improved ease with self feeding as evidenced by decreasing PPT#2 to 18 secs or less    Baseline  21.09 secs    Time  4    Period  Weeks    Status  New      OT LONG TERM GOAL #6   Title  Pt will demonstrate improved ease with dressing as evidenced by decreasing PPT#4 to 22 secs or less    Baseline  27.63    Time  4    Period  Weeks    Status  New            Plan - 02/09/20 0907    Clinical Impression Statement  Pt is a 79 y.o with diagnosis of Parkinson's disease who returns to occupational therapy with a decline in coordination and  ADLS/IADLS. Pt  is currently undergoing treatment for metastatic prostate CA.Pt demonstrates the following: decreased coordination, decreased balance, rigidity , tremor, and decreased ROM which limits pt's performance of daily activities. Pt can benefit from skilled occupational therapy to maximize safety and independence with ADLs/ IADLS.    OT Occupational Profile and History  Problem Focused Assessment - Including review of records relating to presenting problem    Occupational performance deficits (Please refer to  evaluation for details):  ADL's;IADL's;Leisure;Social Participation    Body Structure / Function / Physical Skills  ADL;UE functional use;Balance;Flexibility;FMC;ROM;Gait;Coordination;GMC;Decreased knowledge of precautions;Dexterity;Mobility;Tone;Strength    Rehab Potential  Good    Clinical Decision Making  Limited treatment options, no task modification necessary    Comorbidities Affecting Occupational Performance:  None    Modification or Assistance to Complete Evaluation   No modification of tasks or assist necessary to complete eval    OT Frequency  2x / week    OT Duration  --   5 weeks, plus eval   OT Treatment/Interventions  Self-care/ADL training;Energy conservation;Aquatic Therapy;DME and/or AE instruction;Patient/family education;Balance training;Passive range of motion;Paraffin;Gait Training;Fluidtherapy;Functional Mobility Training;Moist Heat;Therapeutic exercise;Manual Therapy;Therapeutic activities;Neuromuscular education    Plan  coordination HEP, pt is only scheduled for 3 weeks of tx,    Consulted and Agree with Plan of Care  Patient       Patient will benefit from skilled therapeutic intervention in order to improve the following deficits and impairments:   Body Structure / Function / Physical Skills: ADL, UE functional use, Balance, Flexibility, FMC, ROM, Gait, Coordination, GMC, Decreased knowledge of precautions, Dexterity, Mobility, Tone, Strength       Visit Diagnosis: Other lack of coordination  Other symptoms and signs involving the nervous system  Other symptoms and signs involving the musculoskeletal system  Abnormal posture  Other abnormalities of gait and mobility  Stiffness of right elbow, not elsewhere classified  Stiffness of left elbow, not elsewhere classified    Problem List Patient Active Problem List   Diagnosis Date Noted  . Atrial flutter with rapid ventricular response ([place]) 08/18/2019  . New onset atrial flutter ([place]) 08/16/2019  .  Goals of care, counseling/discussion 07/17/2019  . Prostate cancer metastatic to bone ([place]) 05/24/2019  . Establishing care with new doctor, encounter for 09/26/2018  . 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 Parkinson's disease ([place]) 11/07/2015  . 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 Hx of colonic polyps 05/18/2014  . BPH (benign prostatic hyperplasia) 01/12/2014  . Sleep apnea treated with nocturnal BiPAP 01/17/2012  . 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    Eline Geng 02/09/2020, 9:23 AM Theone Murdoch, OTR/L [phone] Phone: [phone] 9:24 AM 02/09/20 [facility] [address] [place], [place], [address] Phone: [phone]   Fax:  [phone]  Name: MICKY SHELLER MRN: [id number] Date of Birth: [birth year]

## 2020-02-12 ENCOUNTER — Other Ambulatory Visit: Payer: Self-pay

## 2020-02-12 ENCOUNTER — Inpatient Hospital Stay: Payer: Medicare Other | Attending: Internal Medicine

## 2020-02-12 ENCOUNTER — Telehealth: Payer: Self-pay | Admitting: Neurology

## 2020-02-12 DIAGNOSIS — N183 Chronic kidney disease, stage 3 unspecified: Secondary | ICD-10-CM | POA: Diagnosis not present

## 2020-02-12 DIAGNOSIS — Z7901 Long term (current) use of anticoagulants: Secondary | ICD-10-CM | POA: Diagnosis not present

## 2020-02-12 DIAGNOSIS — C61 Malignant neoplasm of prostate: Secondary | ICD-10-CM | POA: Diagnosis not present

## 2020-02-12 DIAGNOSIS — Z8249 Family history of ischemic heart disease and other diseases of the circulatory system: Secondary | ICD-10-CM | POA: Insufficient documentation

## 2020-02-12 DIAGNOSIS — K5900 Constipation, unspecified: Secondary | ICD-10-CM | POA: Insufficient documentation

## 2020-02-12 DIAGNOSIS — Z833 Family history of diabetes mellitus: Secondary | ICD-10-CM | POA: Insufficient documentation

## 2020-02-12 DIAGNOSIS — R232 Flushing: Secondary | ICD-10-CM | POA: Diagnosis not present

## 2020-02-12 DIAGNOSIS — Z79899 Other long term (current) drug therapy: Secondary | ICD-10-CM | POA: Insufficient documentation

## 2020-02-12 DIAGNOSIS — Z87891 Personal history of nicotine dependence: Secondary | ICD-10-CM | POA: Diagnosis not present

## 2020-02-12 DIAGNOSIS — C7951 Secondary malignant neoplasm of bone: Secondary | ICD-10-CM | POA: Insufficient documentation

## 2020-02-12 DIAGNOSIS — G20 Parkinson's disease: Secondary | ICD-10-CM | POA: Insufficient documentation

## 2020-02-12 DIAGNOSIS — Z803 Family history of malignant neoplasm of breast: Secondary | ICD-10-CM | POA: Diagnosis not present

## 2020-02-12 LAB — COMPREHENSIVE METABOLIC PANEL
ALT: 6 U/L (ref 0–44)
AST: 42 U/L — ABNORMAL HIGH (ref 15–41)
Albumin: 4 g/dL (ref 3.5–5.0)
Alkaline Phosphatase: 63 U/L (ref 38–126)
Anion gap: 9 (ref 5–15)
BUN: 25 mg/dL — ABNORMAL HIGH (ref 8–23)
CO2: 27 mmol/L (ref 22–32)
Calcium: 9.7 mg/dL (ref 8.9–10.3)
Chloride: 102 mmol/L (ref 98–111)
Creatinine, Ser: 1.77 mg/dL — ABNORMAL HIGH (ref 0.61–1.24)
GFR calc Af Amer: 42 mL/min — ABNORMAL LOW (ref >60)
GFR calc non Af Amer: 36 mL/min — ABNORMAL LOW (ref >60)
Glucose, Bld: 132 mg/dL — ABNORMAL HIGH (ref 70–99)
Potassium: 4.1 mmol/L (ref 3.5–5.1)
Sodium: 138 mmol/L (ref 135–145)
Total Bilirubin: 1.6 mg/dL — ABNORMAL HIGH (ref 0.3–1.2)
Total Protein: 6.9 g/dL (ref 6.5–8.1)

## 2020-02-12 LAB — PSA: Prostatic Specific Antigen: 0.53 ng/mL (ref 0.00–4.00)

## 2020-02-12 LAB — CBC WITH DIFFERENTIAL/PLATELET
Abs Immature Granulocytes: 0.1 10*3/uL — ABNORMAL HIGH (ref 0.00–0.07)
Basophils Absolute: 0.1 10*3/uL (ref 0.0–0.1)
Basophils Relative: 1 %
Eosinophils Absolute: 0.1 10*3/uL (ref 0.0–0.5)
Eosinophils Relative: 1 %
HCT: 40.4 % (ref 39.0–52.0)
Hemoglobin: 13.5 g/dL (ref 13.0–17.0)
Immature Granulocytes: 1 %
Lymphocytes Relative: 9 %
Lymphs Abs: 0.9 10*3/uL (ref 0.7–4.0)
MCH: 31.8 pg (ref 26.0–34.0)
MCHC: 33.4 g/dL (ref 30.0–36.0)
MCV: 95.3 fL (ref 80.0–100.0)
Monocytes Absolute: 0.6 10*3/uL (ref 0.1–1.0)
Monocytes Relative: 6 %
Neutro Abs: 8.5 10*3/uL — ABNORMAL HIGH (ref 1.7–7.7)
Neutrophils Relative %: 82 %
Platelets: 237 10*3/uL (ref 150–400)
RBC: 4.24 MIL/uL (ref 4.22–5.81)
RDW: 13.5 % (ref 11.5–15.5)
WBC: 10.3 10*3/uL (ref 4.0–10.5)
nRBC: 0 % (ref 0.0–0.2)

## 2020-02-12 NOTE — Telephone Encounter (Signed)
Looking into this.

## 2020-02-12 NOTE — Telephone Encounter (Signed)
Chasity from [facility] called requesting a call back about the patient's Ongentys prescription.

## 2020-02-13 NOTE — Telephone Encounter (Signed)
Line busy at 1014 02/13/2020

## 2020-02-13 NOTE — Telephone Encounter (Signed)
For your review, I was never able to get them, so she called back to office to report this.

## 2020-02-13 NOTE — Telephone Encounter (Signed)
Access[employer] message:  Armed forces technical officer from pharmacy and she is notifying office a pt is declining delivery of specialty drug, [employer], Lauren, states pt declined medication delivery, the pharmacy offered the lowest deductible and he said that was not the issue, medication is Ongentys."

## 2020-02-14 ENCOUNTER — Ambulatory Visit: Payer: Medicare Other | Admitting: Physical Therapy

## 2020-02-14 ENCOUNTER — Other Ambulatory Visit: Payer: Self-pay

## 2020-02-14 ENCOUNTER — Ambulatory Visit: Payer: Medicare Other | Admitting: Occupational Therapy

## 2020-02-14 ENCOUNTER — Encounter: Payer: Self-pay | Admitting: Internal Medicine

## 2020-02-14 ENCOUNTER — Inpatient Hospital Stay (HOSPITAL_BASED_OUTPATIENT_CLINIC_OR_DEPARTMENT_OTHER): Payer: Medicare Other | Admitting: Internal Medicine

## 2020-02-14 DIAGNOSIS — R2681 Unsteadiness on feet: Secondary | ICD-10-CM | POA: Diagnosis not present

## 2020-02-14 DIAGNOSIS — C61 Malignant neoplasm of prostate: Secondary | ICD-10-CM

## 2020-02-14 DIAGNOSIS — R29898 Other symptoms and signs involving the musculoskeletal system: Secondary | ICD-10-CM | POA: Diagnosis not present

## 2020-02-14 DIAGNOSIS — R278 Other lack of coordination: Secondary | ICD-10-CM | POA: Diagnosis not present

## 2020-02-14 DIAGNOSIS — R2689 Other abnormalities of gait and mobility: Secondary | ICD-10-CM

## 2020-02-14 DIAGNOSIS — R293 Abnormal posture: Secondary | ICD-10-CM

## 2020-02-14 DIAGNOSIS — G20 Parkinson's disease: Secondary | ICD-10-CM | POA: Diagnosis not present

## 2020-02-14 DIAGNOSIS — C7951 Secondary malignant neoplasm of bone: Secondary | ICD-10-CM | POA: Diagnosis not present

## 2020-02-14 DIAGNOSIS — R29818 Other symptoms and signs involving the nervous system: Secondary | ICD-10-CM | POA: Diagnosis not present

## 2020-02-14 DIAGNOSIS — R232 Flushing: Secondary | ICD-10-CM | POA: Diagnosis not present

## 2020-02-14 DIAGNOSIS — K5900 Constipation, unspecified: Secondary | ICD-10-CM | POA: Diagnosis not present

## 2020-02-14 DIAGNOSIS — N183 Chronic kidney disease, stage 3 unspecified: Secondary | ICD-10-CM | POA: Diagnosis not present

## 2020-02-14 NOTE — Therapy (Signed)
[facility] [address] [place], [place], [address] Phone: [phone]   Fax:  [phone]  Occupational Therapy Treatment  Patient Details  Name: [name] [name] MRN: [id number] Date of Birth: 1941-06-28 Referring Provider (OT): Dr. Carles Collet   Encounter Date: 02/14/2020  OT End of Session - 02/14/20 1841    Visit Number  2    Number of Visits  9    Date for OT Re-Evaluation  03/17/20    Authorization - Visit Number  2    Authorization - Number of Visits  10    OT Start Time  6283    OT Stop Time  1829    OT Time Calculation (min)  42 min    Activity Tolerance  Patient tolerated treatment well    Behavior During Therapy  [facility] for tasks assessed/performed       Past Medical History:  Diagnosis Date  . Parkinson's disease ([place])   . REM sleep behavior disorder   . Sleep apnea     Past Surgical History:  Procedure Laterality Date  . APPENDECTOMY    . 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  Subjective Assessment - 02/14/20 1841    Subjective   Denies pain    Pertinent History  Parkinson's Disease.  PMH:  newly diagnosed with prostate CA    Patient Stated Goals  improve dexterity and fine motor skills    Currently in Pain?  No/denies                PWR! seated basic 4 x 10 reps each, min vc.           OT Education - 02/14/20 1850    Education Details  coordination HEP, see pt instructions, min v.c for larger amplitude movments,. Strategies for donning socks, with simulation, compensation strategeis for tremors.    Person(s) Educated  Patient    Methods  Explanation;Verbal cues;Handout    Comprehension  Verbalized understanding;Returned demonstration;Verbal cues required          OT Long Term Goals - 02/09/20 0908      OT LONG TERM GOAL #1   Title  I with PD specific HEP    Time  4    Period  Weeks    Status  New      OT LONG TERM GOAL #2   Title   Pt will demonstrate improved fine motor coordination for ADLs as evidenced by completing 9 hole peg test for RUE in 36 secs or less    Baseline  RUE 39.47, LUE 31.75    Time  4    Period  Weeks    Status  New      OT LONG TERM GOAL #3   Title  Pt will verbalize understanding of adapted strategies for ADLs/IADLs (particularly strategies for donning jacket, opening containers, putting on socks and shoes,  eating, fasten buttons)    Time  4    Period  Weeks    Status  New      OT LONG TERM GOAL #4   Title  Pt will demonstrate improved RUE functional use for ADLs as evidenced by increasing RUE box/ blocks score by 3 blocks    Baseline  RUE 39, LUE 53    Time  4    Period  Weeks    Status  New      OT LONG TERM GOAL #5  Title  Pt will demonstrate improved ease with self feeding as evidenced by decreasing PPT#2 to 18 secs or less    Baseline  21.09 secs    Time  4    Period  Weeks    Status  New      OT LONG TERM GOAL #6   Title  Pt will demonstrate improved ease with dressing as evidenced by decreasing PPT#4 to 22 secs or less    Baseline  27.63    Time  4    Period  Weeks    Status  New            Plan - 02/14/20 1845    Clinical Impression Statement  Pt is progressing towards goals. He verbalizes understanding of strategies for management of tremors.    OT Occupational Profile and History  Problem Focused Assessment - Including review of records relating to presenting problem    Occupational performance deficits (Please refer to evaluation for details):  ADL's;IADL's;Leisure;Social Participation    Body Structure / Function / Physical Skills  ADL;UE functional use;Balance;Flexibility;FMC;ROM;Gait;Coordination;GMC;Decreased knowledge of precautions;Dexterity;Mobility;Tone;Strength    Rehab Potential  Good    Clinical Decision Making  Limited treatment options, no task modification necessary    Comorbidities Affecting Occupational Performance:  None    Modification or  Assistance to Complete Evaluation   No modification of tasks or assist necessary to complete eval    OT Frequency  2x / week    OT Duration  4 weeks   plus eval   OT Treatment/Interventions  Self-care/ADL training;Energy conservation;Aquatic Therapy;DME and/or AE instruction;Patient/family education;Balance training;Passive range of motion;Paraffin;Gait Training;Fluidtherapy;Functional Mobility Training;Moist Heat;Therapeutic exercise;Manual Therapy;Therapeutic activities;Neuromuscular education    Plan  coordination HEP, pt is only scheduled for 3 weeks of tx,    Consulted and Agree with Plan of Care  Patient       Patient will benefit from skilled therapeutic intervention in order to improve the following deficits and impairments:   Body Structure / Function / Physical Skills: ADL, UE functional use, Balance, Flexibility, FMC, ROM, Gait, Coordination, GMC, Decreased knowledge of precautions, Dexterity, Mobility, Tone, Strength       Visit Diagnosis: Other lack of coordination  Other symptoms and signs involving the nervous system  Other symptoms and signs involving the musculoskeletal system  Abnormal posture    Problem List Patient Active Problem List   Diagnosis Date Noted  . Atrial flutter with rapid ventricular response ([place]) 08/18/2019  . New onset atrial flutter ([place]) 08/16/2019  . Goals of care, counseling/discussion 07/17/2019  . Prostate cancer metastatic to bone ([place]) 05/24/2019  . Establishing care with new doctor, encounter for 09/26/2018  . 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 Parkinson's disease ([place]) 11/07/2015  . 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 Hx of colonic polyps 05/18/2014  . BPH (benign prostatic hyperplasia) 01/12/2014  . Sleep apnea treated with nocturnal BiPAP 01/17/2012  . 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    [name] [name] 02/14/2020, 6:52 PM [name] [name], OTR/L [facility] [address] [place], [place], [address] Phone: [phone]   Fax:  [phone]  Name: [name] [name] MRN: [id number] Date of Birth: 02-03-1941

## 2020-02-14 NOTE — Assessment & Plan Note (Addendum)
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 On Lupron [urology/ q58m;last 10/2019] + Zytiga+prednsione.  June 2021-0.5 trending down; STABLE.   # continue Zytiga+prednisone; Labs today reviewed; plan scan in sep 2021.   # Constipation recently worse; Reocmmend BID. [8850; Dr.Skulskie]; if not improved recommend follow-up with GI.  #A. 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 ; if not better- follow up with GI.   # HOt flashes-grade 1- STABLE.   # DISPOSITION: # follow up in 6 weeks-MD; Labs-1-2 day prior- cbc/cmp/PSA;-Dr.B

## 2020-02-14 NOTE — Patient Instructions (Signed)
Access Code: [email or web address] URL: [email or web address] Date: 02/14/2020 Prepared by: Mady Haagensen  Exercises Stride Stance Weight Shift - 1-2 x daily - 5 x weekly - 1-2 sets - 10 reps Step forward with counter support - 1-2 x daily - 5 x weekly - 1-2 sets - 10 reps Backward Lunge with Counter Support - 1-2 x daily - 5 x weekly - 1-2 sets - 10 reps

## 2020-02-14 NOTE — Progress Notes (Signed)
[facility] NOTE  Patient Care Team: Juline Patch, MD as PCP - General (Family Medicine) Tat, Eustace Quail, DO as Consulting Physician (Neurology) Cammie Sickle, MD as Consulting Physician (Hematology and Oncology)  CHIEF COMPLAINTS/PURPOSE OF CONSULTATION: PROSTATE CANCER  #  Oncology History Overview Note  # PROSTATE CANCER- METATSTATIC to BONE; PSA- 26.5. Sclerotic 1.5 cm left ischial lesion/ Sclerotic medial left iliac bone 1.5 cm lesion (series 2/image 47), increased from 1.0 cm. Gleason score of 4+5= 9; with almost all cores involved greater than 80%.  9/16 Lupron 36-month depot on 9/16. [Urology; Dr.Siniski]  # MID OCT 2020- Zytiga 1000 mg+ prednisone  # Parkinsons's syndrome [Dr.Tat; [place]; neurologist]; CKD-III [creat1.3-1.5]  # GC- referred  # DECLINES- Palliative care [316/2021]  DIAGNOSIS: Prostate cancer  STAGE:     4    ;  GOALS: Palliative/control  CURRENT/MOST RECENT THERAPY : Lupron+ Zytiga      Prostate cancer metastatic to bone ([place])  05/24/2019 Initial Diagnosis   Prostate cancer metastatic to bone ([facility])    HISTORY OF PRESENTING ILLNESS:  [name] [name] 79 y.o.  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  Patient denies any nausea vomiting.  Denies any worsening pain.  Complains of constipation; no blood in stools or black or stools.  Currently taking MiraLAX once a day.   Review of Systems  Constitutional: Negative for chills, diaphoresis, fever, malaise/fatigue and weight loss.  HENT: Negative for nosebleeds and sore throat.   Eyes: Negative for double vision.  Respiratory: Negative for cough, hemoptysis, sputum production, shortness of breath and wheezing.   Cardiovascular: Negative for chest pain, palpitations, orthopnea and leg swelling.  Gastrointestinal: Positive for constipation. Negative for abdominal pain,  blood in stool, diarrhea, heartburn, melena, nausea and vomiting.  Genitourinary: Positive for frequency and urgency. Negative for dysuria.  Musculoskeletal: Negative for back pain and joint pain.  Skin: Negative.  Negative for itching and rash.  Neurological: Positive for tremors. Negative for dizziness, tingling, focal weakness, weakness and headaches.  Endo/Heme/Allergies: Does not bruise/bleed easily.  Psychiatric/Behavioral: Negative for depression. The patient is not nervous/anxious and does not have insomnia.     MEDICAL HISTORY:  Past Medical History:  Diagnosis Date  . Parkinson's disease ([place])   . REM sleep behavior disorder   . Sleep apnea     SURGICAL HISTORY: Past Surgical History:  Procedure Laterality Date  . APPENDECTOMY    . 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 Marital status: Married    Spouse name: Not on file  . Number of children: Not on file  . Years of education: Not on file  . Highest education level: Not on file  Occupational History  . Occupation: retired    Comment: Nutritional therapist  Tobacco Use  . Smoking status: Former Smoker    Types: Cigarettes, Cigars    Quit date: 09/07/2010    Years since quitting: 9.4  . Smokeless tobacco: Never Used  Vaping Use  . Vaping Use: Never used  Substance and Sexual Activity  . Alcohol use: Yes    Comment: 1 can beer/day  . Drug use: Never  . Sexual activity: Not Currently  Other Topics Concern  . 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  Social Determinants of Health   Financial Resource Strain:   . Difficulty of Paying Living Expenses:   Food Insecurity:   . Worried About Charity fundraiser in the Last Year:   . Arboriculturist in the Last Year:   Transportation Needs:   . Film/video editor (Medical):   Marland Kitchen Lack of  Transportation (Non-Medical):   Physical Activity:   . Days of Exercise per Week:   . Minutes of Exercise per Session:   Stress:   . Feeling of Stress :   Social Connections:   . Frequency of Communication with Friends and Family:   . Frequency of Social Gatherings with Friends and Family:   . Attends Religious Services:   . Active Member of Clubs or Organizations:   . Attends Archivist Meetings:   Marland Kitchen Marital Status:   Intimate Partner Violence:   . Fear of Current or Ex-Partner:   . Emotionally Abused:   Marland Kitchen Physically Abused:   . Sexually Abused:     FAMILY HISTORY: Family History  Problem Relation Age of Onset  . Heart disease Maternal Grandfather   . Diabetes Paternal Grandfather   . Breast cancer Daughter     ALLERGIES:  is allergic to influenza vaccines.  MEDICATIONS:  Current Outpatient Medications  Medication Sig Dispense Refill  . abiraterone acetate (ZYTIGA) 250 MG tablet TAKE 4 TABLETS (1,000 MG TOTAL) BY MOUTH DAILY. TAKE ON AN EMPTY STOMACH 1 HOUR BEFORE OR 2 HOURS AFTER A MEAL 120 tablet 4  . apixaban (ELIQUIS) 5 MG TABS tablet Take 1 tablet (5 mg total) by mouth 2 (two) times daily. 60 tablet 1  . carbidopa-levodopa (SINEMET IR) 25-100 MG tablet Take 2 tablets by mouth as directed. TAKE 2 tablets at 7AM, 2 tablet at 11AM, 2 tablets at 3PM, 1 tablet at 7PM    . clonazePAM (KLONOPIN) 0.5 MG tablet Take 0.5 mg by mouth at bedtime. Dr Lisabeth Devoid    . diltiazem (CARDIZEM CD) 120 MG 24 hr capsule Take 1 capsule (120 mg total) by mouth daily. 30 capsule 2  . Glycerin-Hypromellose-PEG 400 (VISINE DRY EYE OP) Apply to eye. otc    . ketoconazole (NIZORAL) 2 % shampoo Apply 1 application topically 2 (two) times a week.     . methocarbamol (ROBAXIN) 500 MG tablet 1/2-1 po qHS prn    . Opicapone 50 MG CAPS Take 50 mg by mouth daily. 90 capsule 2  . predniSONE (DELTASONE) 10 MG tablet TAKE 1 TABLET (10 MG TOTAL) BY MOUTH DAILY WITH BREAKFAST. 30 tablet 6  . tamsulosin  (FLOMAX) 0.4 MG CAPS capsule Take 1 capsule (0.4 mg total) by mouth daily. (Patient taking differently: Take 0.4 mg by mouth daily. [place]) 90 capsule 3   No current facility-administered medications for this visit.      Marland Kitchen  PHYSICAL EXAMINATION: ECOG PERFORMANCE STATUS: 0 - Asymptomatic  Vitals:   02/14/20 0952  BP: 104/75  Pulse: 71  Temp: (!) 96.9 F (36.1 C)   Filed Weights   02/14/20 0952  Weight: 167 lb 6 oz (75.9 kg)    Physical Exam  Constitutional: He is oriented to person, place, and time and well-developed, well-nourished, and in no distress.  HENT:  Head: Normocephalic and atraumatic.  Mouth/Throat: Oropharynx is clear and moist. No oropharyngeal exudate.  Eyes: Pupils are equal, round, and reactive to light.  Cardiovascular: Normal rate and regular rhythm.  Pulmonary/Chest: No respiratory distress. He has no wheezes.  Abdominal: Soft.  Bowel sounds are normal. He exhibits no distension and no mass. There is no abdominal tenderness. There is no rebound and no guarding.  Musculoskeletal:        General: No tenderness or edema. Normal range of motion.     Cervical back: Normal range of motion and neck supple.  Neurological: He is alert and oriented to person, place, and time.  Tremor of right upper extremity.  Skin: Skin is warm.  Psychiatric: Affect normal.     LABORATORY DATA:  I have reviewed the data as listed Lab Results  Component Value Date   WBC 10.3 02/12/2020   HGB 13.5 02/12/2020   HCT 40.4 02/12/2020   MCV 95.3 02/12/2020   PLT 237 02/12/2020   Recent Labs    11/17/19 1347 01/01/20 1438 02/12/20 1053  NA 140 140 138  K 3.9 3.9 4.1  CL 106 106 102  CO2 23 23 27   GLUCOSE 133* 138* 132*  BUN 15 19 25*  CREATININE 1.57* 1.50* 1.77*  CALCIUM 9.6 9.6 9.7  GFRNONAA 42* 44* 36*  GFRAA 48* 51* 42*  PROT 7.1 7.2 6.9  ALBUMIN 4.1 4.2 4.0  AST 36 32 42*  ALT 16 13 6   ALKPHOS 61 67 63  BILITOT 1.4* 1.6* 1.6*    RADIOGRAPHIC  STUDIES: I have personally reviewed the radiological images as listed and agreed with the findings in the report. No results found.  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 On Lupron [urology/ q68m;last 10/2019] + Zytiga+prednsione.  June 2021-0.5 trending down; STABLE.   # continue Zytiga+prednisone; Labs today reviewed; plan scan in sep 2021.   # Constipation recently worse; Reocmmend BID. [9379; Dr.Skulskie]; if not improved recommend follow-up with GI.  #A. 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 ; if not better- follow up with GI.   # HOt flashes-grade 1- STABLE.   # DISPOSITION: # follow up in 6 weeks-MD; Labs-1-2 day prior- cbc/cmp/PSA;-Dr.B   All questions were answered. The patient knows to call the clinic with any problems, questions or concerns.    Cammie Sickle, MD 02/15/2020 5:29 PM

## 2020-02-14 NOTE — Patient Instructions (Signed)
Coordination Exercises  Perform the following exercises for 20 minutes 1 times per day. Perform with both hand(s). Perform using big movements.  Flipping Cards: Place deck of cards on the table. Flip cards over by opening your hand big to grasp and then turn your palm up big. Deal cards: Hold 1/2 or whole deck in your hand. Use thumb to push card off top of deck with one big push. Rotate ball with fingertips: Pick up with fingers/thumb and move as much as you can with each turn/movement (clockwise and counter-clockwise). Pick up coins and place in coin bank or container: Pick up with big, intentional movements. Do not drag coin to the edge. Pick up coins and stack one at a time: Pick up with big, intentional movements. Do not drag coin to the edge. (5-10 in a stack) Pick up 5-10 coins one at a time and hold in palm. Then, move coins from palm to fingertips one at time and place in coin bank/container. Practice writing: Slow down, write big, and focus on forming each letter. Perform "Flicks"/hand stretches (PWR! Hands): Close hands then flick out your fingers with focus on opening hands, pulling wrists back, and extending elbows like you are pushing. 

## 2020-02-15 NOTE — Therapy (Signed)
[facility] [address] [place], [place], [address] Phone: [phone]   Fax:  [phone]  Physical Therapy Treatment  Patient Details  Name: [name] [name] MRN: [id number] Date of Birth: 1940/12/11 Referring Provider (PT): Alonza Bogus, DO   Encounter Date: 02/14/2020   PT End of Session - 02/15/20 0903    Visit Number 2    Number of Visits 9    Date for PT Re-Evaluation 18/84/16   60 day cert for 4 wk POC   Authorization Type Medicare/Tricare    Progress Note Due on Visit 10    PT Start Time 6063    PT Stop Time 1743    PT Time Calculation (min) 38 min    Activity Tolerance Patient tolerated treatment well    Behavior During Therapy [facility] for tasks assessed/performed           Past Medical History:  Diagnosis Date   Parkinson's disease ([place])    REM sleep behavior disorder    Sleep apnea     Past Surgical History:  Procedure Laterality Date   APPENDECTOMY     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   Subjective Assessment - 02/14/20 1707    Subjective Had some discomfort with constipation; saw my cancer doctor earlier today and discussed.    Pertinent History Parkinson's disease    Patient Stated Goals Pt's goal for therapy is to help not drag foot as much.    Currently in Pain? No/denies                             [facility] Adult PT Treatment/Exercise - 02/14/20 1710      Transfers   Transfers Sit to Stand;Stand to Sit    Sit to Stand 6: Modified independent (Device/Increase time);Without upper extremity assist;From chair/3-in-1    Stand to Sit 6: Modified independent (Device/Increase time);Without upper extremity assist;To chair/3-in-1      Ambulation/Gait   Ambulation/Gait Yes    Ambulation/Gait Assistance 7: Independent    Ambulation Distance (Feet) 230 Feet   230; 200 ft x 2, 80 ft x 2   Assistive device None    Gait Pattern  Step-through pattern;Decreased arm swing - right;Decreased step length - right;Narrow base of support;Decreased trunk rotation    Gait Comments Pt rates effort with walking initially at 5/10 (noted to have decreased R step length, decreased foot clearance); with second bout of walking, pt rates effort level as 8-9/10  (pt noted to scuff R foot only once during second bout of gait, overall improved increased step length and heelstrike).  PT provides cues each bout of walking to start with and maintain increased intensity for improved R step length, foot clearance, R arm swing.      Neuro Re-ed    Neuro Re-ed Details  Stagger stance rocking anterior/posterior direction, x 3 initial reps then 10 additional reps, each foot position.  Forward step and weightshift x 10 reps, then back step and weigthshift x 10 reps cues for improved R foot clearance and technique.      Exercises   Exercises Knee/Hip;Ankle      Knee/Hip Exercises: Aerobic   Stepper Seated SciFit Stepper, Level 1.8, 4 extremities, x 6 minutes, cudes to keep RPM >80 for improved reciprocal arm/leg motion.  Pt rates effort level as 8-9/10 during this activity.      Ankle Exercises:  Seated   Heel Raises Both;10 reps;3 seconds   2 sets   Toe Raise 10 reps;3 seconds   BLEs, 2 sets                 PT Education - 02/15/20 0902    Education Details Initiated HEP-see instructions; cues to increase effort level for improved intensity of movement for improved R foot clearance with gait    Person(s) Educated Patient    Methods Explanation;Demonstration;Handout;Verbal cues    Comprehension Verbalized understanding;Returned demonstration;Verbal cues required;Need further instruction               PT Long Term Goals - 02/08/20 1353      PT LONG TERM GOAL #1   Title Pt will be independent with HEP to address Parkinson's specific deficits.  TARGET 03/08/2020    Time 4    Period Weeks    Status New      PT LONG TERM GOAL #2    Title Pt will perform at least 4 of 5 reps of sit<>stand transfers from <18 inch surfaces, minimal UE support, no LOB, for improved low surfaces transfers.    Time 4    Period Weeks    Status New      PT LONG TERM GOAL #3   Title Pt will improve 6MWT distance by at least 100 ft, for improved gait efficiency and safety with long distance gait.    Baseline 6MWT:  1178 ft    Time 4    Period Weeks    Status New      PT LONG TERM GOAL #4   Title Pt will improve MiniBESTest score to at least 25/28 for improved balance, decreased fall risk.    Time 4    Period Weeks    Status New      PT LONG TERM GOAL #5   Title Pt will verbalize ongoing fitness plans upon d/c from PT.    Time 4    Period Weeks    Status New                 Plan - 02/15/20 0904    Clinical Impression Statement Initiated HEP today to address improved step length, foot clearance, heelstrike on RLE.  Gait training with cues for increased intensity of movement for improved R foot clearance, step length, and heelstrike.  With increased intensity, pt rates effort as 8-9/10, and is able to maintain for less than 100 ft prior to needing reminder cues to maintain that effort level.  Pt will continue to benefit from skilled PT to further address gait, balance, strength for improved functional mobility and decreased fall risk.    Personal Factors and Comorbidities Comorbidity 2    Comorbidities Parkinson's disease, REM sleep disorder    Examination-Activity Limitations Locomotion Level;Transfers    Examination-Participation Restrictions Community Activity    Stability/Clinical Decision Making Stable/Uncomplicated    Rehab Potential Good    PT Frequency 2x / week    PT Duration 4 weeks   plus eval   PT Treatment/Interventions ADLs/Self Care Home Management;Gait training;Functional mobility training;Therapeutic activities;Therapeutic exercise;Balance training;Neuromuscular re-education;Patient/family education    PT Next  Visit Plan Review HEP-focus on PWR! Moves, gait with arm swing, step length; walking as part of HEP (?do we need to try foot-up brace on RLE?); high level balance exercises; sit<>stand from low surfaces    Consulted and Agree with Plan of Care Patient  Patient will benefit from skilled therapeutic intervention in order to improve the following deficits and impairments:  Abnormal gait, Decreased coordination, Difficulty walking, Decreased balance, Decreased mobility, Postural dysfunction  Visit Diagnosis: Other abnormalities of gait and mobility  Other symptoms and signs involving the nervous system     Problem List Patient Active Problem List   Diagnosis Date Noted   Atrial flutter with rapid ventricular response ([place]) 08/18/2019   New onset atrial flutter ([place]) 08/16/2019   Goals of care, counseling/discussion 07/17/2019   Prostate cancer metastatic to bone ([place]) 05/24/2019   Establishing care with new doctor, encounter for 09/26/2018   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/30/2015   Parkinson's disease ([place]) 11/07/2015   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 02/15/2020, 9:07 AM Frazier Butt., PT  [facility] [address] [place], [place], [address] Phone: [phone]   Fax:  [phone]  Name: [name] [name] MRN: [id number] Date of Birth: Oct 31, 1940

## 2020-02-20 ENCOUNTER — Ambulatory Visit: Payer: Medicare Other | Admitting: Occupational Therapy

## 2020-02-20 ENCOUNTER — Ambulatory Visit: Payer: Medicare Other | Admitting: Physical Therapy

## 2020-02-23 ENCOUNTER — Other Ambulatory Visit: Payer: Self-pay

## 2020-02-23 ENCOUNTER — Ambulatory Visit: Payer: Medicare Other | Admitting: Physical Therapy

## 2020-02-23 ENCOUNTER — Ambulatory Visit: Payer: Medicare Other | Admitting: Occupational Therapy

## 2020-02-23 ENCOUNTER — Encounter: Payer: Self-pay | Admitting: Occupational Therapy

## 2020-02-23 DIAGNOSIS — R293 Abnormal posture: Secondary | ICD-10-CM | POA: Diagnosis not present

## 2020-02-23 DIAGNOSIS — R2681 Unsteadiness on feet: Secondary | ICD-10-CM

## 2020-02-23 DIAGNOSIS — R278 Other lack of coordination: Secondary | ICD-10-CM

## 2020-02-23 DIAGNOSIS — R29818 Other symptoms and signs involving the nervous system: Secondary | ICD-10-CM

## 2020-02-23 DIAGNOSIS — R2689 Other abnormalities of gait and mobility: Secondary | ICD-10-CM

## 2020-02-23 DIAGNOSIS — R29898 Other symptoms and signs involving the musculoskeletal system: Secondary | ICD-10-CM | POA: Diagnosis not present

## 2020-02-23 DIAGNOSIS — M25622 Stiffness of left elbow, not elsewhere classified: Secondary | ICD-10-CM

## 2020-02-23 DIAGNOSIS — M25621 Stiffness of right elbow, not elsewhere classified: Secondary | ICD-10-CM

## 2020-02-23 NOTE — Therapy (Signed)
[facility] [address] [place], [place], [address] Phone: [phone]   Fax:  [phone]  Physical Therapy Treatment  Patient Details  Name: [name] [name] MRN: [id number] Date of Birth: 06/12/1941 Referring Provider (PT): Alonza Bogus, DO   Encounter Date: 02/23/2020   PT End of Session - 02/23/20 1429    Visit Number 3    Number of Visits 9    Date for PT Re-Evaluation 56/81/27   60 day cert for 4 wk POC   Authorization Type Medicare/Tricare    Progress Note Due on Visit 10    PT Start Time 0849    PT Stop Time 0931    PT Time Calculation (min) 42 min    Activity Tolerance Patient tolerated treatment well    Behavior During Therapy [facility] for tasks assessed/performed           Past Medical History:  Diagnosis Date   Parkinson's disease ([place])    REM sleep behavior disorder    Sleep apnea     Past Surgical History:  Procedure Laterality Date   APPENDECTOMY     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   Subjective Assessment - 02/23/20 0850    Subjective No new changes.  No falls, no stumbles.    Pertinent History Parkinson's disease    Patient Stated Goals Pt's goal for therapy is to help not drag foot as much.    Currently in Pain? No/denies                             [facility] Adult PT Treatment/Exercise - 02/23/20 0001      Ambulation/Gait   Ambulation/Gait Yes    Ambulation/Gait Assistance 7: Independent    Ambulation Distance (Feet) 345 Feet   x 2; then 115 ft x 2   Assistive device None    Gait Pattern Step-through pattern;Decreased arm swing - right;Decreased step length - right;Narrow base of support;Decreased trunk rotation;Poor foot clearance - right   foot clearance improved with foot-up brace   Ambulation Surface Level;Unlevel;Indoor;Outdoor    Gait velocity 9.44 sec = 3.47 ft/sec wearing foot-up brace on RLE    Gait Comments Trial  of R foot-up brace to help with timing, coordination, foot clearance with RLE with gait.  Pt ambulates 345 ft without foot-up, then 345 ft with foot-up, then additional 115 ft with and without foot-up brace.  Pt reports noting immediate difference, where he "doesn't have to think about R leg."  Encouraged pt to continue to think of increased effort for RLE for improved overall swing through and step length on RLE.  Provided pt information on how to obtain foot-up brace for home use if he desires.  Additional gait on outdoor surfaces, x 500 ft, no device, wearing foot-up brace RLE, with no overt LOB.  Takes about 4 minutes for gait and educated patient how he can work on walking as part of general exercise program.  Educated in walking program for HEP.             Neuro Re-education  Reviewed HEP given last visit.  Pt reports not doing since being in PT last.  PT provided cues for technique and for importance of HEP performance.  Stride Stance Weight Shift - 1-2 x daily - 5 x weekly - 1-2 sets - 10 reps  (While at counter, holding with 1  UE support, worked on R UE swing forward and back) Step forward with counter support - 1-2 x daily - 5 x weekly - 1-2 sets - 10 reps Backward Lunge with Counter Support - 1-2 x daily - 5 x weekly - 1-2 sets - 10 reps  Cues for step length and technique.     PT Education - 02/23/20 1428    Education Details Walking program for home; benefits of foot-up brace and how to obtain    Person(s) Educated Patient    Methods Explanation;Demonstration;Handout    Comprehension Verbalized understanding;Returned demonstration               PT Long Term Goals - 02/08/20 1353      PT LONG TERM GOAL #1   Title Pt will be independent with HEP to address Parkinson's specific deficits.  TARGET 03/08/2020    Time 4    Period Weeks    Status New      PT LONG TERM GOAL #2   Title Pt will perform at least 4 of 5 reps of sit<>stand transfers from <18 inch surfaces,  minimal UE support, no LOB, for improved low surfaces transfers.    Time 4    Period Weeks    Status New      PT LONG TERM GOAL #3   Title Pt will improve 6MWT distance by at least 100 ft, for improved gait efficiency and safety with long distance gait.    Baseline 6MWT:  1178 ft    Time 4    Period Weeks    Status New      PT LONG TERM GOAL #4   Title Pt will improve MiniBESTest score to at least 25/28 for improved balance, decreased fall risk.    Time 4    Period Weeks    Status New      PT LONG TERM GOAL #5   Title Pt will verbalize ongoing fitness plans upon d/c from PT.    Time 4    Period Weeks    Status New                 Plan - 02/23/20 1429    Clinical Impression Statement Reviewed HEP from last visit, with pt needing cues for correct technique throughout; he admits not doing HEP at home over this past week.  Explained importance of HEP and consistently performing to help with improved carryover of movement patterns learned in therapy.  Pt will continue to benefit from skilled PT to further address gait, blaance, stregnth for improved functional mobility.    Personal Factors and Comorbidities Comorbidity 2    Comorbidities Parkinson's disease, REM sleep disorder    Examination-Activity Limitations Locomotion Level;Transfers    Examination-Participation Restrictions Community Activity    Stability/Clinical Decision Making Stable/Uncomplicated    Rehab Potential Good    PT Frequency 2x / week    PT Duration 4 weeks   plus eval   PT Treatment/Interventions ADLs/Self Care Home Management;Gait training;Functional mobility training;Therapeutic activities;Therapeutic exercise;Balance training;Neuromuscular re-education;Patient/family education    PT Next Visit Plan Review HEP (has he done them at home?)-focus on PWR! Moves, gait with arm swing, step length; walking as part of HEP ask about foot-up brace on RLE); high level balance exercises; sit<>stand from low surfaces     Consulted and Agree with Plan of Care Patient           Patient will benefit from skilled therapeutic intervention in order to improve the following  deficits and impairments:  Abnormal gait, Decreased coordination, Difficulty walking, Decreased balance, Decreased mobility, Postural dysfunction  Visit Diagnosis: Other abnormalities of gait and mobility  Unsteadiness on feet     Problem List Patient Active Problem List   Diagnosis Date Noted   Atrial flutter with rapid ventricular response ([place]) 08/18/2019   New onset atrial flutter ([place]) 08/16/2019   Goals of care, counseling/discussion 07/17/2019   Prostate cancer metastatic to bone ([place]) 05/24/2019   Establishing care with new doctor, encounter for 09/26/2018   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 02/23/2020, 2:33 PM  Frazier Butt., PT   [facility] [address] [place], [place], [address] Phone: [phone]   Fax:  [phone]  Name: JAHLANI LORENTZ MRN: [id number] Date of Birth: Apr 21, 1941

## 2020-02-23 NOTE — Patient Instructions (Signed)
WALKING  Walking is a great form of exercise to increase your strength, endurance and overall fitness.  A walking program can help you start slowly and gradually build endurance as you go.  Everyone's ability is different, so each person's starting point will be different.  You do not have to follow them exactly.  The are just samples. You should simply find out what's right for you and stick to that program.   In the beginning, you'll start off walking 2-3 times a day for short distances.  As you get stronger, you'll be walking further at just 1-2 times per day.  A. You Can Walk For A Certain Length Of Time Each Day    Walk 3-4 minutes 3 times per day.  Increase 1-2 minutes every 2 days (3 times per day).  Work up to 10-12 minutes (1-2 times per day).   Example:   Day 1-2 3-4 minutes 3 times per day   Day 7-8 7-8 minutes 2-3 times per day   Day 13-14 10-12 minutes 1-2 times per day  B. You Can Walk For a Certain Distance Each Day     Distance can be substituted for time.    Example:   3 laps around the inside of the house   3 trips to [employer]

## 2020-02-23 NOTE — Therapy (Signed)
[facility] [address] [place], [place], [address] Phone: [phone]   Fax:  [phone]  Occupational Therapy Treatment  Patient Details  Name: [name] [name] MRN: [id number] Date of Birth: June 09, 1941 Referring Provider (OT): Dr. Carles Collet   Encounter Date: 02/23/2020   OT End of Session - 02/23/20 0815    Visit Number 3    Number of Visits 9    Date for OT Re-Evaluation 03/17/20    Authorization - Visit Number 3    Authorization - Number of Visits 10    OT Start Time 0806    OT Stop Time 0845    OT Time Calculation (min) 39 min    Activity Tolerance Patient tolerated treatment well    Behavior During Therapy [facility] for tasks assessed/performed           Past Medical History:  Diagnosis Date  . Parkinson's disease ([place])   . REM sleep behavior disorder   . Sleep apnea     Past Surgical History:  Procedure Laterality Date  . APPENDECTOMY    . 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   Subjective Assessment - 02/23/20 0809    Subjective  I'm just tired and shakey    Pertinent History Parkinson's Disease.  PMH:  newly diagnosed with prostate CA    Patient Stated Goals improve dexterity and fine motor skills    Currently in Pain? No/denies             Practiced use of large amplitude movement strategies for opening bottles and container with pull off lid with min cueing after review of strategies.         OT Education - 02/23/20 [phone]    Education Details Reviewed coordination HEP with min cueing for incr movement amplitude    Person(s) Educated Patient    Methods Explanation;Demonstration;Verbal cues    Comprehension Verbalized understanding;Returned demonstration               OT Long Term Goals - 02/09/20 0908      OT LONG TERM GOAL #1   Title I with PD specific HEP    Time 4    Period Weeks    Status New      OT LONG TERM GOAL #2   Title Pt will  demonstrate improved fine motor coordination for ADLs as evidenced by completing 9 hole peg test for RUE in 36 secs or less    Baseline RUE 39.47, LUE 31.75    Time 4    Period Weeks    Status New      OT LONG TERM GOAL #3   Title Pt will verbalize understanding of adapted strategies for ADLs/IADLs (particularly strategies for donning jacket, opening containers, putting on socks and shoes,  eating, fasten buttons)    Time 4    Period Weeks    Status New      OT LONG TERM GOAL #4   Title Pt will demonstrate improved RUE functional use for ADLs as evidenced by increasing RUE box/ blocks score by 3 blocks    Baseline RUE 39, LUE 53    Time 4    Period Weeks    Status New      OT LONG TERM GOAL #5   Title Pt will demonstrate improved ease with self feeding as evidenced by decreasing PPT#2 to 18 secs or less    Baseline 21.09 secs  Time 4    Period Weeks    Status New      OT LONG TERM GOAL #6   Title Pt will demonstrate improved ease with dressing as evidenced by decreasing PPT#4 to 22 secs or less    Baseline 27.63    Time 4    Period Weeks    Status New                 Plan - 02/23/20 0806    Clinical Impression Statement Pt is progressing towards goals with understanding of coordination HEP and strategies for opening containers and buttoning.    OT Occupational Profile and History Problem Focused Assessment - Including review of records relating to presenting problem    Occupational performance deficits (Please refer to evaluation for details): ADL's;IADL's;Leisure;Social Participation    Body Structure / Function / Physical Skills ADL;UE functional use;Balance;Flexibility;FMC;ROM;Gait;Coordination;GMC;Decreased knowledge of precautions;Dexterity;Mobility;Tone;Strength    Rehab Potential Good    Clinical Decision Making Limited treatment options, no task modification necessary    Comorbidities Affecting Occupational Performance: None    Modification or Assistance to  Complete Evaluation  No modification of tasks or assist necessary to complete eval    OT Frequency 2x / week    OT Duration 4 weeks   plus eval   OT Treatment/Interventions Self-care/ADL training;Energy conservation;Aquatic Therapy;DME and/or AE instruction;Patient/family education;Balance training;Passive range of motion;Paraffin;Gait Training;Fluidtherapy;Functional Mobility Training;Moist Heat;Therapeutic exercise;Manual Therapy;Therapeutic activities;Neuromuscular education    Plan pt is only scheduled for 3 weeks of tx:  strategies for ADLs    Consulted and Agree with Plan of Care Patient           Patient will benefit from skilled therapeutic intervention in order to improve the following deficits and impairments:   Body Structure / Function / Physical Skills: ADL, UE functional use, Balance, Flexibility, FMC, ROM, Gait, Coordination, GMC, Decreased knowledge of precautions, Dexterity, Mobility, Tone, Strength       Visit Diagnosis: Other symptoms and signs involving the nervous system  Other lack of coordination  Other symptoms and signs involving the musculoskeletal system  Abnormal posture  Stiffness of right elbow, not elsewhere classified  Stiffness of left elbow, not elsewhere classified  Unsteadiness on feet  Other abnormalities of gait and mobility    Problem List Patient Active Problem List   Diagnosis Date Noted  . Atrial flutter with rapid ventricular response ([place]) 08/18/2019  . New onset atrial flutter ([place]) 08/16/2019  . Goals of care, counseling/discussion 07/17/2019  . Prostate cancer metastatic to bone ([place]) 05/24/2019  . Establishing care with new doctor, encounter for 09/26/2018  . 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 Parkinson's disease ([place]) 11/07/2015  . 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 Hx of colonic polyps 05/18/2014  . BPH (benign prostatic hyperplasia) 01/12/2014  . Sleep apnea treated with nocturnal BiPAP  01/17/2012  . 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    [facility] 02/23/2020, 3:24 PM  [facility] [address] [place], [place], [address] Phone: [phone]   Fax:  [phone]  Name: VADA YELLEN MRN: [id number] Date of Birth: Aug 23, 1941   Vianne Bulls, OTR/L [facility] [address] [place], [place]  [address] [phone] phone [phone] 02/23/20 3:24 PM

## 2020-02-27 ENCOUNTER — Ambulatory Visit: Payer: Medicare Other | Admitting: Physical Therapy

## 2020-02-27 ENCOUNTER — Ambulatory Visit: Payer: Medicare Other | Admitting: Occupational Therapy

## 2020-02-27 ENCOUNTER — Other Ambulatory Visit: Payer: Self-pay

## 2020-02-27 VITALS — BP 96/61 | HR 64

## 2020-02-27 DIAGNOSIS — R29898 Other symptoms and signs involving the musculoskeletal system: Secondary | ICD-10-CM

## 2020-02-27 DIAGNOSIS — R278 Other lack of coordination: Secondary | ICD-10-CM

## 2020-02-27 DIAGNOSIS — R293 Abnormal posture: Secondary | ICD-10-CM | POA: Diagnosis not present

## 2020-02-27 DIAGNOSIS — R29818 Other symptoms and signs involving the nervous system: Secondary | ICD-10-CM | POA: Diagnosis not present

## 2020-02-27 DIAGNOSIS — R2689 Other abnormalities of gait and mobility: Secondary | ICD-10-CM | POA: Diagnosis not present

## 2020-02-27 DIAGNOSIS — R2681 Unsteadiness on feet: Secondary | ICD-10-CM | POA: Diagnosis not present

## 2020-02-27 NOTE — Therapy (Signed)
[facility] [address] [place], [place], [address] Phone: [phone]   Fax:  [phone]  Occupational Therapy Treatment  Patient Details  Name: [name] [name] MRN: [id number] Date of Birth: April 09, 1941 Referring Provider (OT): Dr. Carles Collet   Encounter Date: 02/27/2020   OT End of Session - 02/27/20 1318    Visit Number 4    Number of Visits 9    Date for OT Re-Evaluation 03/17/20    Authorization - Visit Number 4    Authorization - Number of Visits 10    OT Start Time 6962   Pt left early not feeling well   OT Stop Time 1330    OT Time Calculation (min) 13 min    Activity Tolerance Patient tolerated treatment well    Behavior During Therapy [facility] for tasks assessed/performed           Past Medical History:  Diagnosis Date  . Parkinson's disease ([place])   . REM sleep behavior disorder   . Sleep apnea     Past Surgical History:  Procedure Laterality Date  . APPENDECTOMY    . 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   Subjective Assessment - 02/27/20 1317    Subjective  Pt reports feels a little dizzy today    Pertinent History Parkinson's Disease.  PMH:  newly diagnosed with prostate CA    Patient Stated Goals improve dexterity and fine motor skills    Currently in Pain? No/denies             Treatment: Crumpling bag to simulate pulling up socks, min v.c for larger amplitude movements. Rotating ball with left and right UE's mod difficulty with RUE.  Pt reports not feeling well and session was ended early. Pt was instructed to call his oncologist if he continues to feel bad.                   OT Education - 02/27/20 1341    Education Details PWR! hands basic 4    Person(s) Educated Patient    Methods Explanation;Demonstration;Verbal cues    Comprehension Verbalized understanding;Returned demonstration               OT Long Term Goals - 02/09/20  0908      OT LONG TERM GOAL #1   Title I with PD specific HEP    Time 4    Period Weeks    Status New      OT LONG TERM GOAL #2   Title Pt will demonstrate improved fine motor coordination for ADLs as evidenced by completing 9 hole peg test for RUE in 36 secs or less    Baseline RUE 39.47, LUE 31.75    Time 4    Period Weeks    Status New      OT LONG TERM GOAL #3   Title Pt will verbalize understanding of adapted strategies for ADLs/IADLs (particularly strategies for donning jacket, opening containers, putting on socks and shoes,  eating, fasten buttons)    Time 4    Period Weeks    Status New      OT LONG TERM GOAL #4   Title Pt will demonstrate improved RUE functional use for ADLs as evidenced by increasing RUE box/ blocks score by 3 blocks    Baseline RUE 39, LUE 53    Time 4    Period Weeks    Status New  OT LONG TERM GOAL #5   Title Pt will demonstrate improved ease with self feeding as evidenced by decreasing PPT#2 to 18 secs or less    Baseline 21.09 secs    Time 4    Period Weeks    Status New      OT LONG TERM GOAL #6   Title Pt will demonstrate improved ease with dressing as evidenced by decreasing PPT#4 to 22 secs or less    Baseline 27.63    Time 4    Period Weeks    Status New                 Plan - 02/27/20 1319    Clinical Impression Statement Pt progress limited today due to plt not feeling well. Session was ended early.    OT Occupational Profile and History Problem Focused Assessment - Including review of records relating to presenting problem    Occupational performance deficits (Please refer to evaluation for details): ADL's;IADL's;Leisure;Social Participation    Body Structure / Function / Physical Skills ADL;UE functional use;Balance;Flexibility;FMC;ROM;Gait;Coordination;GMC;Decreased knowledge of precautions;Dexterity;Mobility;Tone;Strength    Rehab Potential Good    Clinical Decision Making Limited treatment options, no task  modification necessary    Comorbidities Affecting Occupational Performance: None    Modification or Assistance to Complete Evaluation  No modification of tasks or assist necessary to complete eval    OT Frequency 2x / week    OT Duration 4 weeks   plus eval   OT Treatment/Interventions Self-care/ADL training;Energy conservation;Aquatic Therapy;DME and/or AE instruction;Patient/family education;Balance training;Passive range of motion;Paraffin;Gait Training;Fluidtherapy;Functional Mobility Training;Moist Heat;Therapeutic exercise;Manual Therapy;Therapeutic activities;Neuromuscular education    Plan work towards goals as able, anticipate d/c next week    Consulted and Agree with Plan of Care Patient           Patient will benefit from skilled therapeutic intervention in order to improve the following deficits and impairments:   Body Structure / Function / Physical Skills: ADL, UE functional use, Balance, Flexibility, FMC, ROM, Gait, Coordination, GMC, Decreased knowledge of precautions, Dexterity, Mobility, Tone, Strength       Visit Diagnosis: Other symptoms and signs involving the nervous system  Other lack of coordination  Other symptoms and signs involving the musculoskeletal system    Problem List Patient Active Problem List   Diagnosis Date Noted  . Atrial flutter with rapid ventricular response ([place]) 08/18/2019  . New onset atrial flutter ([place]) 08/16/2019  . Goals of care, counseling/discussion 07/17/2019  . Prostate cancer metastatic to bone ([place]) 05/24/2019  . Establishing care with new doctor, encounter for 09/26/2018  . 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 Parkinson's disease ([place]) 11/07/2015  . 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 Hx of colonic polyps 05/18/2014  . BPH (benign prostatic hyperplasia) 01/12/2014  . Sleep apnea treated with nocturnal BiPAP 01/17/2012  . 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    Khaylee Mcevoy 02/27/2020, 1:42  PM  [facility] [address] [place], [place], [address] Phone: [phone]   Fax:  [phone]  Name: [name] [name] MRN: [id number] Date of Birth: 06/18/1941

## 2020-02-27 NOTE — Therapy (Signed)
[facility] [address] [place], [place], [address] Phone: [phone]   Fax:  [phone]  Physical Therapy Treatment  Patient Details  Name: [name] [name] MRN: [id number] Date of Birth: [birth year] Referring Provider (PT): Alonza Bogus, DO   Encounter Date: 02/27/2020   PT End of Session - 02/27/20 1437    Visit Number 4    Number of Visits 9    Date for PT Re-Evaluation 23/53/61   60 day cert for 4 wk POC   Authorization Type Medicare/Tricare    Progress Note Due on Visit 10    PT Start Time 1234    PT Stop Time 1313    PT Time Calculation (min) 39 min    Activity Tolerance Patient tolerated treatment well;Patient limited by fatigue   lethargic today   Behavior During Therapy [facility] for tasks assessed/performed;Flat affect           Past Medical History:  Diagnosis Date  . Parkinson's disease ([place])   . REM sleep behavior disorder   . Sleep apnea     Past Surgical History:  Procedure Laterality Date  . APPENDECTOMY    . 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  Feel a little more tired than usual.    Pertinent History Parkinson's disease    Patient Stated Goals Pt's goal for therapy is to help not drag foot as much.    Currently in Pain? No/denies                             [facility] Adult PT Treatment/Exercise - 02/27/20 0001      Transfers   Transfers Sit to Stand;Stand to Sit    Sit to Stand 6: Modified independent (Device/Increase time);Without upper extremity assist;From chair/3-in-1    Stand to Sit 6: Modified independent (Device/Increase time);Without upper extremity assist;To chair/3-in-1      Ambulation/Gait   Ambulation/Gait Yes    Ambulation/Gait Assistance 7: Independent    Ambulation Distance (Feet) 400 Feet    then 150, 100 ft   Assistive device None    Gait Pattern Step-through pattern;Decreased arm swing - right;Decreased step length - right;Narrow base of support;Decreased trunk rotation;Poor foot clearance - right   foot clearance improves with foot-up brace   Ambulation Surface Level;Indoor    Gait Comments Indoor gait with trial again of R foot-up brace.  Pt has no episodes of foot scuffing or decreased foot clearance today RLE wearing foot-up brace.  Additional gait activities with quick stops/starts, forward/back walkinga nd turns; no overt LOB noted throughout.  Reviewed discussion last visit about walking program at home; pt verbalizes walking 3-4 minutes, once per day and PT discusses again working up to 3x , 3-4 minutes per day for improved endurance for gait.      High Level Balance   High Level Balance Comments Standing lateral weightshifting x 10 reps.  REviewed previous HEP, with pt return demo understanding:  stagger stance anterior/posterior weightshift x 10 reps, then wtih added arm swing x 10 reps.  Forward step and weigthshift x 10, then back step and weigthshift x 10, then forward/back step and weightshift x 10 reps.  Progressed to forward step over obstacle-5 reps  x 2 sets, then side step and weightshift/return to midline x 10 reps, over obstacle.      Therapeutic Activites    Therapeutic Activities Other Therapeutic Activities    Other Therapeutic Activities Discussed in relation to pt's decrease in BP measure from standing to sitting today (systolic decreases 11 points), and pt's c/o dizziness and fatigue upon coming into therapy.  Discussed intaking plenty of fluids (pt verbalizes doing this due to other medical issues), slowing his change of positions and performing seated exercises upon sitting for long periods prior to stnading.  Upon standing, practiced lateral weigthshifting to slowly get ready to ambulate (making sure he does not have dizzines prior to initiating gait).   Educated pt that if he continues to experience this, to reach out to his MD and make aware.      Knee/Hip Exercises: Seated   Long Arc Quad AROM;Right;Left;1 set;10 reps    Other Seated Knee/Hip Exercises seated ankle pumps, 10 reps    Marching AROM;Right;Left;1 set;10 reps                       PT Long Term Goals - 02/08/20 1353      PT LONG TERM GOAL #1   Title Pt will be independent with HEP to address Parkinson's specific deficits.  TARGET 03/08/2020    Time 4    Period Weeks    Status New      PT LONG TERM GOAL #2   Title Pt will perform at least 4 of 5 reps of sit<>stand transfers from <18 inch surfaces, minimal UE support, no LOB, for improved low surfaces transfers.    Time 4    Period Weeks    Status New      PT LONG TERM GOAL #3   Title Pt will improve 6MWT distance by at least 100 ft, for improved gait efficiency and safety with long distance gait.    Baseline 6MWT:  1178 ft    Time 4    Period Weeks    Status New      PT LONG TERM GOAL #4   Title Pt will improve MiniBESTest score to at least 25/28 for improved balance, decreased fall risk.    Time 4    Period Weeks    Status New      PT LONG TERM GOAL #5   Title Pt will verbalize ongoing fitness plans upon d/c from PT.    Time 4    Period Weeks    Status New                 Plan - 02/27/20 1438    Clinical Impression Statement Reviewed again HEP and walking program, with pt reporting he has done HEP and has done walking once per day.  He is overall fatigued today compared to previous sessions, with slight decrease in BP measures from standing to sitting.  Provided several rest breaks through session, but overall, pt responds well to use of foot-up brace (has not yet gotten his for home, but plans to order).  Will continue to benefit from further skilled PT to address LTGs, with pt in agreement to d/c next week.    Personal Factors and Comorbidities Comorbidity 2    Comorbidities Parkinson's  disease, REM sleep disorder, prostate cancer (metastatic)    Examination-Activity Limitations Locomotion Level;Transfers    Examination-Participation Restrictions Community Activity    Stability/Clinical Decision Making Stable/Uncomplicated    Rehab Potential Good  PT Frequency 2x / week    PT Duration 4 weeks   plus eval   PT Treatment/Interventions ADLs/Self Care Home Management;Gait training;Functional mobility training;Therapeutic activities;Therapeutic exercise;Balance training;Neuromuscular re-education;Patient/family education    PT Next Visit Plan Ask about walking program and HEP; try standing PWR! Moves as part of HEP if needed; ask about ordering of foot-up brace; work torwards LTGs, with plans for d/c next week    Consulted and Agree with Plan of Care Patient           Patient will benefit from skilled therapeutic intervention in order to improve the following deficits and impairments:  Abnormal gait, Decreased coordination, Difficulty walking, Decreased balance, Decreased mobility, Postural dysfunction  Visit Diagnosis: Other abnormalities of gait and mobility  Unsteadiness on feet     Problem List Patient Active Problem List   Diagnosis Date Noted  . Atrial flutter with rapid ventricular response ([place]) 08/18/2019  . New onset atrial flutter ([place]) 08/16/2019  . Goals of care, counseling/discussion 07/17/2019  . Prostate cancer metastatic to bone ([place]) 05/24/2019  . Establishing care with new doctor, encounter for 09/26/2018  . 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 Parkinson's disease ([place]) 11/07/2015  . 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 Hx of colonic polyps 05/18/2014  . BPH (benign prostatic hyperplasia) 01/12/2014  . Sleep apnea treated with nocturnal BiPAP 01/17/2012  . 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    Atwell Mcdanel W. 02/27/2020, 2:44 PM Frazier Butt., PT [facility] [address] [place], [place], [address] Phone: [phone]   Fax:  [phone]  Name: [name] [name] MRN: [id number] Date of Birth: 1941/03/14

## 2020-02-29 ENCOUNTER — Encounter: Payer: Self-pay | Admitting: Occupational Therapy

## 2020-02-29 ENCOUNTER — Other Ambulatory Visit: Payer: Self-pay

## 2020-02-29 ENCOUNTER — Ambulatory Visit: Payer: Medicare Other | Admitting: Physical Therapy

## 2020-02-29 ENCOUNTER — Ambulatory Visit: Payer: Medicare Other | Admitting: Occupational Therapy

## 2020-02-29 ENCOUNTER — Telehealth: Payer: Self-pay

## 2020-02-29 VITALS — BP 90/57

## 2020-02-29 DIAGNOSIS — R2681 Unsteadiness on feet: Secondary | ICD-10-CM | POA: Diagnosis not present

## 2020-02-29 DIAGNOSIS — R29898 Other symptoms and signs involving the musculoskeletal system: Secondary | ICD-10-CM | POA: Diagnosis not present

## 2020-02-29 DIAGNOSIS — R278 Other lack of coordination: Secondary | ICD-10-CM

## 2020-02-29 DIAGNOSIS — M25622 Stiffness of left elbow, not elsewhere classified: Secondary | ICD-10-CM

## 2020-02-29 DIAGNOSIS — R293 Abnormal posture: Secondary | ICD-10-CM | POA: Diagnosis not present

## 2020-02-29 DIAGNOSIS — R29818 Other symptoms and signs involving the nervous system: Secondary | ICD-10-CM

## 2020-02-29 DIAGNOSIS — M25621 Stiffness of right elbow, not elsewhere classified: Secondary | ICD-10-CM

## 2020-02-29 DIAGNOSIS — R2689 Other abnormalities of gait and mobility: Secondary | ICD-10-CM | POA: Diagnosis not present

## 2020-02-29 NOTE — Therapy (Signed)
[facility] [address] [place], [place], [address] Phone: [phone]   Fax:  [phone]  Physical Therapy Treatment  Patient Details  Name: [name] [name] MRN: [id number] Date of Birth: 1941/07/22 Referring Provider (PT): Alonza Bogus, DO   Encounter Date: 02/29/2020   PT End of Session - 02/29/20 1228    Visit Number 5    Number of Visits 9    Date for PT Re-Evaluation 69/62/95   60 day cert for 4 wk POC   Authorization Type Medicare/Tricare    Progress Note Due on Visit 10    PT Start Time 0932    PT Stop Time 1015    PT Time Calculation (min) 43 min    Activity Tolerance Patient tolerated treatment well;Patient limited by fatigue   Assessed BP and O2 measures throughout   Behavior During Therapy [facility] for tasks assessed/performed;Flat affect           Past Medical History:  Diagnosis Date  . Parkinson's disease ([place])   . REM sleep behavior disorder   . Sleep apnea     Past Surgical History:  Procedure Laterality Date  . APPENDECTOMY    . 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   Subjective Assessment - 02/29/20 0934    Subjective Did some of my exercises and added a minute to my walking at home.    Pertinent History Parkinson's disease    Patient Stated Goals Pt's goal for therapy is to help not drag foot as much.    Currently in Pain? No/denies              [facility] PT Assessment - 02/29/20 0001      6 Minute Walk- Baseline   6 Minute Walk- Baseline yes    BP (mmHg) 114/67    HR (bpm) 52    02 Sat (%RA) 96 %      6 Minute walk- Post Test   6 Minute Walk Post Test yes    BP (mmHg) 119/69    HR (bpm) 51    02 Sat (%RA) 98 %    Modified Borg Scale for Dyspnea 0.5- Very, very slight shortness of breath    Perceived Rate of Exertion (Borg) 12-      6 minute walk test results    Aerobic Endurance Distance Walked 1226                          [facility] Adult PT Treatment/Exercise - 02/29/20 0001      Transfers   Transfers Sit to Stand;Stand to Sit    Sit to Stand 6: Modified independent (Device/Increase time);Without upper extremity assist;From chair/3-in-1    Stand to Sit 6: Modified independent (Device/Increase time);Without upper extremity assist;To chair/3-in-1      Ambulation/Gait   Ambulation/Gait Yes    Ambulation/Gait Assistance 7: Independent    Ambulation Distance (Feet) 1240 Feet   total; 1226 ft in 6 MWT   Assistive device None    Gait Pattern Step-through pattern;Decreased arm swing - right;Decreased step length - right;Narrow base of support;Decreased trunk rotation;Poor foot clearance - right    Ambulation Surface Level;Indoor    Gait Comments Did not use foot-up brace on RLE today; pt has not yet ordered for home, but plans to do so.      Neuro Re-ed    Neuro Re-ed Details  Performed forward/back step  and weightshift at counter with UE support, x 10 reps, then stagger stance forward/back rocking with added RUE arm swing (LUE at counter for support), x 10 reps each foot position.  Pt return demo understanding of this exercise as part of HEP.           Pt performs PWR! Moves in standing position (Pt already has pictures and is doing at home, about 3x/wk)-PT provides min cues for correct technique and intensity.  Pt requires seated rest break mid-way through.   PWR! Up for improved posture x 10 reps  PWR! Rock for improved weighshifting x 10 reps each side  PWR! Twist for improved trunk rotation x 5 reps each side  PWR! Step for improved step initiation x 5 reps each side-cues for increased step height.         Self Care:  Discussed relation of PWR! Moves into functional activities.  Discussed continueing optimal fitness after discharge (walking, HEP from PT, and aerobic activity with [employer]).  Pt is adding to his walking program and verbalizes understanding of  continued fitness.    PT Education - 02/29/20 1227    Education Details Reviewed PWR! Moves, discussed optimal fitness post d/c from PT    Person(s) Educated Patient    Methods Explanation;Demonstration   Pt already has handout for PWR! Moves in standing   Comprehension Verbalized understanding               PT Long Term Goals - 02/29/20 0950      PT LONG TERM GOAL #1   Title Pt will be independent with HEP to address Parkinson's specific deficits.  TARGET 03/08/2020    Time 4    Period Weeks    Status Achieved      PT LONG TERM GOAL #2   Title Pt will perform at least 4 of 5 reps of sit<>stand transfers from <18 inch surfaces, minimal UE support, no LOB, for improved low surfaces transfers.    Time 4    Period Weeks    Status New      PT LONG TERM GOAL #3   Title Pt will improve 6MWT distance by at least 100 ft, for improved gait efficiency and safety with long distance gait.    Baseline 6MWT:  1178 ft; 1226 ft 02/29/2020    Time 4    Period Weeks    Status On-going      PT LONG TERM GOAL #4   Title Pt will improve MiniBESTest score to at least 25/28 for improved balance, decreased fall risk.    Time 4    Period Weeks    Status New      PT LONG TERM GOAL #5   Title Pt will verbalize ongoing fitness plans upon d/c from PT.    Baseline [employer] 3x/wk, PT HEP 3x/wk, walking program daily    Time 4    Period Weeks    Status Achieved                 Plan - 02/29/20 1319    Clinical Impression Statement Pt continues to report some fatigue, but feels better than last visit.  BP measures appear WNL during assessements surrounding 6 MWT measures.  Pt reports doing more of his exercises at home and has added time to walking program.  Began assessing LTGs, with pt meeting LTG 1 and 5.  He has improved 6 MWT by 50 ft since eval.  He is  making progress towards goals, but he has had some limitations due to fatigue.  PT has encouraged pt to follow up with  physician if fatigue worsens or limits daily activities.  Anticipate dishcarge from PT next week.  (May want to monitor vitals-his BP was low previous session and his heartrate is typically low-nothing new)    Personal Factors and Comorbidities Comorbidity 2    Comorbidities Parkinson's disease, REM sleep disorder, prostate cancer (metastatic)    Examination-Activity Limitations Locomotion Level;Transfers    Examination-Participation Restrictions Community Activity    Stability/Clinical Decision Making Stable/Uncomplicated    Rehab Potential Good    PT Frequency 2x / week    PT Duration 4 weeks   plus eval   PT Treatment/Interventions ADLs/Self Care Home Management;Gait training;Functional mobility training;Therapeutic activities;Therapeutic exercise;Balance training;Neuromuscular re-education;Patient/family education    PT Next Visit Plan Began assessing LTGs this session, as pt is feeling he is not having as many walking/balance problems.  I talked with him about d/c first visit next week and he was agreeable.  Check remaining goals, ask about foot-up brace; discuss return PT screen in 6 months.    Consulted and Agree with Plan of Care Patient           Patient will benefit from skilled therapeutic intervention in order to improve the following deficits and impairments:  Abnormal gait, Decreased coordination, Difficulty walking, Decreased balance, Decreased mobility, Postural dysfunction  Visit Diagnosis: Unsteadiness on feet  Other abnormalities of gait and mobility  Other symptoms and signs involving the nervous system     Problem List Patient Active Problem List   Diagnosis Date Noted  . Atrial flutter with rapid ventricular response ([place]) 08/18/2019  . New onset atrial flutter ([place]) 08/16/2019  . Goals of care, counseling/discussion 07/17/2019  . Prostate cancer metastatic to bone ([place]) 05/24/2019  . Establishing care with new doctor, encounter for 09/26/2018  . Prostate  hypertrophy 12/30/2015  . Parkinson's disease ([place]) 11/07/2015  . 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 Hx of colonic polyps 05/18/2014  . BPH (benign prostatic hyperplasia) 01/12/2014  . Sleep apnea treated with nocturnal BiPAP 01/17/2012  . 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    Meilyn Heindl W. 02/29/2020, 1:25 PM  Frazier Butt., PT   [facility] [address] [place], [place], [address] Phone: [phone]   Fax:  [phone]  Name: NIRANJAN RUFENER MRN: [id number] Date of Birth: 1941-05-26

## 2020-02-29 NOTE — Telephone Encounter (Signed)
PA for Eligard/Lupron submitted today

## 2020-02-29 NOTE — Patient Instructions (Addendum)
   Pt already performing these at home-recommend 3-5 times per week as part of his current HEP

## 2020-02-29 NOTE — Therapy (Signed)
[facility] [address] [place], [place], [address] Phone: [phone]   Fax:  [phone]  Occupational Therapy Treatment  Patient Details  Name: [name] [name] MRN: [id number] Date of Birth: 03/25/1941 Referring Provider (OT): Dr. Carles Collet   Encounter Date: 02/29/2020   OT End of Session - 02/29/20 0859    Visit Number 5    Number of Visits 9    Date for OT Re-Evaluation 03/17/20    Authorization - Visit Number 5    Authorization - Number of Visits 10    OT Start Time 0849    OT Stop Time 0929    OT Time Calculation (min) 40 min    Activity Tolerance Patient tolerated treatment well    Behavior During Therapy [facility] for tasks assessed/performed           Past Medical History:  Diagnosis Date  . Parkinson's disease ([place])   . REM sleep behavior disorder   . Sleep apnea     Past Surgical History:  Procedure Laterality Date  . APPENDECTOMY    . 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  PMH:  newly diagnosed with prostate CA    Patient Stated Goals improve dexterity and fine motor skills    Currently in Pain? No/denies                                OT treatment/Education - 02/29/20 0928    Education Details PWR! modified quadraped basic 4- x 10 reps in standing at table, min v.c initally then pt returned demo,  adapted strategies for ADLS with big movement(fastening buttons, donning socks and shoes, opening containers, eating, and donning jacket like a cape, pt returned demonstration, min v.c)    Person(s) Educated Patient    Methods Explanation;Demonstration;Verbal cues;Handout    Comprehension Verbalized understanding;Returned demonstration               OT Long Term Goals - 02/29/20 0904      OT LONG TERM GOAL #1   Title  I with PD specific HEP    Time 4    Period Weeks    Status Achieved      OT LONG TERM GOAL #2   Title Pt will demonstrate improved fine motor coordination for ADLs as evidenced by completing 9 hole peg test for RUE in 36 secs or less    Baseline RUE 39.47, LUE 31.75    Time 4    Period Weeks    Status On-going      OT LONG TERM GOAL #3   Title Pt will verbalize understanding of adapted strategies for ADLs/IADLs (particularly strategies for donning jacket, opening containers, putting on socks and shoes,  eating, fasten buttons)    Time 4    Period Weeks    Status Achieved      OT LONG TERM GOAL #4   Title Pt will demonstrate improved RUE functional use for ADLs as evidenced by increasing RUE box/ blocks score by 3 blocks    Baseline RUE 39, LUE 53    Time 4    Period Weeks    Status Achieved   42     OT LONG TERM GOAL #5  Title Pt will demonstrate improved ease with self feeding as evidenced by decreasing PPT#2 to 18 secs or less    Baseline 21.09 secs    Time 4    Period Weeks    Status On-going      OT LONG TERM GOAL #6   Title Pt will demonstrate improved ease with dressing as evidenced by decreasing PPT#4 to 22 secs or less    Baseline 27.63    Time 4    Period Weeks    Status On-going                 Plan - 02/29/20 0925    Clinical Impression Statement Pt is feeling better today. Pt verbalizes understanding of adapted strategies for    OT Occupational Profile and History Problem Focused Assessment - Including review of records relating to presenting problem    Occupational performance deficits (Please refer to evaluation for details): ADL's;IADL's;Leisure;Social Participation    Body Structure / Function / Physical Skills ADL;UE functional use;Balance;Flexibility;FMC;ROM;Gait;Coordination;GMC;Decreased knowledge of precautions;Dexterity;Mobility;Tone;Strength    Rehab Potential Good    Clinical Decision Making Limited treatment options, no task modification  necessary    Comorbidities Affecting Occupational Performance: None    Modification or Assistance to Complete Evaluation  No modification of tasks or assist necessary to complete eval    OT Frequency 2x / week    OT Duration 4 weeks   plus eval   OT Treatment/Interventions Self-care/ADL training;Energy conservation;Aquatic Therapy;DME and/or AE instruction;Patient/family education;Balance training;Passive range of motion;Paraffin;Gait Training;Fluidtherapy;Functional Mobility Training;Moist Heat;Therapeutic exercise;Manual Therapy;Therapeutic activities;Neuromuscular education    Plan work towards goals as able, anticipate d/c next week    Consulted and Agree with Plan of Care Patient           Patient will benefit from skilled therapeutic intervention in order to improve the following deficits and impairments:   Body Structure / Function / Physical Skills: ADL, UE functional use, Balance, Flexibility, FMC, ROM, Gait, Coordination, GMC, Decreased knowledge of precautions, Dexterity, Mobility, Tone, Strength       Visit Diagnosis: Other symptoms and signs involving the nervous system  Other lack of coordination  Other symptoms and signs involving the musculoskeletal system  Abnormal posture  Stiffness of right elbow, not elsewhere classified  Stiffness of left elbow, not elsewhere classified  Unsteadiness on feet    Problem List Patient Active Problem List   Diagnosis Date Noted  . Atrial flutter with rapid ventricular response ([place]) 08/18/2019  . New onset atrial flutter ([place]) 08/16/2019  . Goals of care, counseling/discussion 07/17/2019  . Prostate cancer metastatic to bone ([place]) 05/24/2019  . Establishing care with new doctor, encounter for 09/26/2018  . 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 Parkinson's disease ([place]) 11/07/2015  . 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 Hx of colonic polyps 05/18/2014  . BPH (benign prostatic hyperplasia)  01/12/2014  . Sleep apnea treated with nocturnal BiPAP 01/17/2012  . 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    Payslie Mccaig 02/29/2020, 5:06 PM  [facility] [address] [place], [place], [address] Phone: [phone]   Fax:  [phone]  Name: MAN EFFERTZ MRN: [id number] Date of Birth: 24-Nov-1940

## 2020-03-04 NOTE — Telephone Encounter (Signed)
Incoming authorization for Lupron, no PA needed. Case #[id number]

## 2020-03-06 ENCOUNTER — Encounter: Payer: Self-pay | Admitting: Physical Therapy

## 2020-03-06 ENCOUNTER — Encounter: Payer: Self-pay | Admitting: Occupational Therapy

## 2020-03-06 ENCOUNTER — Ambulatory Visit: Payer: Medicare Other | Admitting: Occupational Therapy

## 2020-03-06 ENCOUNTER — Ambulatory Visit: Payer: Medicare Other | Admitting: Physical Therapy

## 2020-03-06 ENCOUNTER — Other Ambulatory Visit: Payer: Self-pay

## 2020-03-06 VITALS — BP 104/64 | HR 56

## 2020-03-06 DIAGNOSIS — R278 Other lack of coordination: Secondary | ICD-10-CM | POA: Diagnosis not present

## 2020-03-06 DIAGNOSIS — M25621 Stiffness of right elbow, not elsewhere classified: Secondary | ICD-10-CM

## 2020-03-06 DIAGNOSIS — R29898 Other symptoms and signs involving the musculoskeletal system: Secondary | ICD-10-CM | POA: Diagnosis not present

## 2020-03-06 DIAGNOSIS — R293 Abnormal posture: Secondary | ICD-10-CM

## 2020-03-06 DIAGNOSIS — R2689 Other abnormalities of gait and mobility: Secondary | ICD-10-CM | POA: Diagnosis not present

## 2020-03-06 DIAGNOSIS — R29818 Other symptoms and signs involving the nervous system: Secondary | ICD-10-CM

## 2020-03-06 DIAGNOSIS — M25622 Stiffness of left elbow, not elsewhere classified: Secondary | ICD-10-CM

## 2020-03-06 DIAGNOSIS — R2681 Unsteadiness on feet: Secondary | ICD-10-CM

## 2020-03-06 NOTE — Therapy (Signed)
[facility] [address] [place], [place], [address] Phone: [phone]   Fax:  [phone]  Physical Therapy Treatment/Discharge Summary  Patient Details  Name: [name] [name] MRN: [id number] Date of Birth: 10-26-1940 Referring Provider (PT): Alonza Bogus, DO   Encounter Date: 03/06/2020   PT End of Session - 03/06/20 1001    Visit Number 6    Number of Visits 9    Date for PT Re-Evaluation 28/31/51   60 day cert for 4 wk POC   Authorization Type Medicare/Tric[employer]    Progress Note Due on Visit 10    PT Start Time [phone]   full time not used due to discharge and pt requesting to end early before OT session   PT Stop Time 0955    PT Time Calculation (min) 24 min    Activity Tolerance Patient tolerated treatment well    Behavior During Therapy [facility] for tasks assessed/performed;Flat affect           Past Medical History:  Diagnosis Date  . Parkinson's disease ([place])   . REM sleep behavior disorder   . Sleep apnea     Past Surgical History:  Procedure Laterality Date  . APPENDECTOMY    . 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 Did a lot of housework yesterday. No falls.    Pertinent History Parkinson's disease    Patient Stated Goals Pt's goal for therapy is to help not drag foot as much.    Currently in Pain? No/denies              [facility] PT Assessment - 03/06/20 0939      Mini-BESTest   Sit To Stand Normal: Comes to stand without use of hands and stabilizes independently.    Rise to Toes Normal: Stable for 3 s with maximum height.    Stand on one leg (left) Moderate: < 20 s   7 seconds, 8.22 seconds    Stand on one leg (right) Moderate: < 20 s   10 seconds both timrs   Stand on one leg - lowest score 1    Compensatory Stepping Correction - Forward Normal: Recovers  independently with a single, large step (second realignement is allowed).    Compensatory Stepping Correction - Backward Normal: Recovers independently with a single, large step    Compensatory Stepping Correction - Left Lateral Normal: Recovers independently with 1 step (crossover or lateral OK)    Compensatory Stepping Correction - Right Lateral Normal: Recovers independently with 1 step (crossover or lateral OK)    Stepping Corredtion Lateral - lowest score 2    Stance - Feet together, eyes open, firm surface  Normal: 30s    Stance - Feet together, eyes closed, foam surface  Normal: 30s    Incline - Eyes Closed Normal: Stands independently 30s and aligns with gravity    Change in Gait Speed Normal: Significantly changes walkling speed without imbalance    Walk with head turns - Horizontal Normal: performs head turns with no change in gait speed and good balance    Walk with pivot turns Moderate:Turns with feet close SLOW (>4 steps) with good balance.    Step over obstacles Normal: Able to step over box with minimal change of gait speed and with good balance.  Timed UP & GO with Dual Task Normal: No noticeable change in sitting, standing or walking while backward counting when compared to TUG without    Mini-BEST total score 26      Timed Up and Go Test   Normal TUG (seconds) 10.16    Cognitive TUG (seconds) 11.5                         [facility] Adult PT Treatment/Exercise - 03/06/20 0939      Transfers   Transfers Sit to Stand;Stand to Sit    Sit to Stand 6: Modified independent (Device/Increase time);Without upper extremity assist;From chair/3-in-1    Five time sit to stand comments  from lower surface chair - 5 reps of sit <> stand from lower chair with no UE support and no LOB.     Stand to Sit 6: Modified independent (Device/Increase time);Without upper extremity assist;To chair/3-in-1      Therapeutic Activites    Therapeutic Activities Other Therapeutic Activities     Other Therapeutic Activities discussed with pt about purchasing foot-up brace for RLE for improved foot clearance, pt reports he has not yet ordered it, but is aware on how to order and has sheet in his folder                  PT Education - 03/06/20 1000    Education Details getting scheduled for PD screen in 6 months, importance of purchasing foot up brace, progress towards goals, D/C    Person(s) Educated Patient    Methods Explanation    Comprehension Verbalized understanding               PT Long Term Goals - 03/06/20 0939      PT LONG TERM GOAL #1   Title Pt will be independent with HEP to address Parkinson's specific deficits.  TARGET 03/08/2020    Time 4    Period Weeks    Status Achieved      PT LONG TERM GOAL #2   Title Pt will perform at least 4 of 5 reps of sit<>stand transfers from <18 inch surfaces, minimal UE support, no LOB, for improved low surfaces transfers.    Baseline 5 reps of sit <> stand from lower chair with no UE support and no LOB.    Time 4    Period Weeks    Status Achieved      PT LONG TERM GOAL #3   Title Pt will improve 6MWT distance by at least 100 ft, for improved gait efficiency and safety with long distance gait.    Baseline 6MWT:  1178 ft; 1226 ft 02/29/2020    Time 4    Period Weeks    Status On-going      PT LONG TERM GOAL #4   Title Pt will improve MiniBESTest score to at least 25/28 for improved balance, decreased fall risk.    Baseline 26/28 on 03/06/20    Time 4    Period Weeks    Status Achieved      PT LONG TERM GOAL #5   Title Pt will verbalize ongoing fitness plans upon d/c from PT.    Baseline [employer] 3x/wk, PT HEP 3x/wk, walking program daily    Time 4    Period Weeks    Status Achieved             PHYSICAL THERAPY DISCHARGE SUMMARY  Visits from Start of Care: 6  Current functional level related to goals / functional outcomes: See LTGs.   Remaining deficits: Impaired SLS, impaired gait  while turning, incr fatigue.    Education / Equipment: HEP, community fitness program   Plan: Patient agrees to discharge.  Patient goals were met. Patient is being discharged due to meeting the stated rehab goals.  ?????          Plan - 03/06/20 1005    Clinical Impression Statement Focus of today's skilled session was assessing remainder of LTGs for anticipated D/C. Pt met LTG #2 and #4. Pt able to perform 5 reps of sit <> stand from a lower surface with no UE support and no LOB. Pt improved miniBEST score to a 26/28 (previously was 23/28). Pt has not yet purchased foot up brace, but reports he has the handout and is aware on how to do so. Pt is agreeable to D/C from PT due to achieving LTGs - will schedule PD screen in 6 months.    Personal Factors and Comorbidities Comorbidity 2    Comorbidities Parkinson's disease, REM sleep disorder, prostate cancer (metastatic)    Examination-Activity Limitations Locomotion Level;Transfers    Examination-Participation Restrictions Community Activity    Stability/Clinical Decision Making Stable/Uncomplicated    Rehab Potential Good    PT Frequency 2x / week    PT Duration 4 weeks   plus eval   PT Treatment/Interventions ADLs/Self Care Home Management;Gait training;Functional mobility training;Therapeutic activities;Therapeutic exercise;Balance training;Neuromuscular re-education;Patient/family education    PT Next Visit Plan D/C    Consulted and Agree with Plan of Care Patient           Patient will benefit from skilled therapeutic intervention in order to improve the following deficits and impairments:  Abnormal gait, Decreased coordination, Difficulty walking, Decreased balance, Decreased mobility, Postural dysfunction  Visit Diagnosis: Unsteadiness on feet  Other abnormalities of gait and mobility  Other symptoms and signs involving the nervous system  Other lack of coordination     Problem List Patient Active Problem List    Diagnosis Date Noted  . Atrial flutter with rapid ventricular response ([place]) 08/18/2019  . New onset atrial flutter ([place]) 08/16/2019  . Goals of care, counseling/discussion 07/17/2019  . Prostate cancer metastatic to bone ([place]) 05/24/2019  . Establishing care with new doctor, encounter for 09/26/2018  . 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 Parkinson's disease ([place]) 11/07/2015  . 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 Hx of colonic polyps 05/18/2014  . BPH (benign prostatic hyperplasia) 01/12/2014  . Sleep apnea treated with nocturnal BiPAP 01/17/2012  . 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    Arliss Journey, PT, DPT  03/06/2020, 10:06 AM  [facility] [address] [place], [place], [address] Phone: [phone]   Fax:  [phone]  Name: [name] [name] MRN: [id number] Date of Birth: [birth year]

## 2020-03-06 NOTE — Patient Instructions (Addendum)
° ° °  When taking jacket/button-up shirt off, hold at belly button level (get a lot of material in your hand).  Then twist to take off 1 shoulder, then twist off other shoulder.  Try squeezing spoon/fork when eating (off/on-pulse) if needed for tremors    Energy Conservation Techniques  1.  Sit for as many activities as possible.  2.  Use slow, smooth movements.  Rushing increased discomfort.  3. Determine the necessity of performing the task.  Simplify those tasks that are necessary.  (Get clothes out of the dryer when theyre warm instead of ironing, let dishes air dry, etc.)  4. Take frequent rests both during and between activities.  Avoid repetitive tasks.  5. Pre-plan your activities; try a daily and/or weekly schedule.  Spread out the activities that are most fatiguing (break up cleaning tasks over multiple days).  6.  Remember to plan a balance of work, rest and recreation.  7. Consider the best time for each activity.  Do the most exertive task when you have the most energy.  8. Dont carry items if you can push them.  Slide, dont lift.  Push, dont pull.  9. Utilize two hands when appropriate.  10.   Maintain good posture and use proper body mechanics. a. Avoid remaining in one position for too long. b. When lifting , bend at the knees, not at the waist.  Exhale when bending down, inhale when straightening up. c. Carry objects as close to your body and as near to the center of the pelvis.  11.  Avoid wasted body movements (position yourself for the task so that you avoid bending, twisting, etc. when possible).  12. Select the best working environment.  Consider lighting, ventilation, clothing, and equipment.   a. Hot, cold, or humid weather puts increased stress on the body.   b. Dressing in layers is more flexible. c. Walk in climate-controlled conditions if weather is not ideal or walk in early morning or late evening in the summer.  59. Organize your storage areas,  making the items you use daily convenient.   a. Store heaviest items at waist height.   b. Store frequently used items between shoulder and knee height.   c. Consider leaving frequently used items on countertops. (You can organize in storage baskets based on time used/purpose.) d. Avoid clutter  14. Feelings and emotions can be real causes of fatigue.  Try to avoid unnecessary worry, irritation, or frustration.  Avoid stress, it can also be a source of fatigue.  15.   Get help from other people for difficult tasks.  16. Explore equipment or items that may be able to do the job for you with greater ease.  (Electric can openers, blenders, lightweight items for cleaning, etc.)

## 2020-03-06 NOTE — Therapy (Signed)
[name] [address] [name], [place], [address] Phone: [phone]   Fax:  [phone]  Occupational Therapy Treatment  Patient Details  Name: [name] [name] MRN: [id number] Date of Birth: 07-18-1941 Referring Provider (OT): Dr. Carles Collet   Encounter Date: 03/06/2020   OT End of Session - 03/06/20 1213    Visit Number 6    Number of Visits 9    Date for OT Re-Evaluation 03/17/20    Authorization - Visit Number 6    Authorization - Number of Visits 10    OT Start Time 1007    OT Stop Time 1055    OT Time Calculation (min) 48 min    Activity Tolerance Patient tolerated treatment well    Behavior During Therapy [facility] for tasks assessed/performed           Past Medical History:  Diagnosis Date  . Parkinson's disease ([place])   . REM sleep behavior disorder   . Sleep apnea     Past Surgical History:  Procedure Laterality Date  . APPENDECTOMY    . 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   Subjective Assessment - 03/06/20 1008    Subjective  little sore--did a lot of housework yesterday.    Pertinent History Parkinson's Disease.  PMH:  newly diagnosed with prostate CA    Patient Stated Goals improve dexterity and fine motor skills    Currently in Pain? No/denies             Checked remaining goals and discussed progress.        OT Education - 03/06/20 1205    Education Details Energy Conservation Strategies and Reviewed adaptive strategies/large amplitude movement strategies for ADLs including:  eating (also recommended squeezing utensil some to decr tremors temporialy), donning/doffing jacket (practiced and instructed in strategies for doffing), recommended opening containers and pull top with shelf liner for incr ease    Person(s) Educated Patient    Methods Explanation;Demonstration;Verbal cues;Handout    Comprehension Verbalized understanding;Returned demonstration                 OT Long Term Goals - 03/06/20 1021      OT LONG TERM GOAL #1   Title I with PD specific HEP    Time 4    Period Weeks    Status Achieved      OT LONG TERM GOAL #2   Title Pt will demonstrate improved fine motor coordination for ADLs as evidenced by completing 9 hole peg test for RUE in 36 secs or less    Baseline RUE 39.47, LUE 31.75    Time 4    Period Weeks    Status Not Met   03/06/20:  43.75sec     OT LONG TERM GOAL #3   Title Pt will verbalize understanding of adapted strategies for ADLs/IADLs (particularly strategies for donning jacket, opening containers, putting on socks and shoes,  eating, fasten buttons)    Time 4    Period Weeks    Status Achieved      OT LONG TERM GOAL #4   Title Pt will demonstrate improved RUE functional use for ADLs as evidenced by increasing RUE box/ blocks score by 3 blocks    Baseline RUE 39, LUE 53    Time 4    Period Weeks    Status Achieved   42     OT LONG TERM GOAL #5  Title Pt will demonstrate improved ease with self feeding as evidenced by decreasing PPT#2 to 18 secs or less    Baseline 21.09 secs    Time 4    Period Weeks    Status Not Met   03/06/20:18.53sec     OT LONG TERM GOAL #6   Title Pt will demonstrate improved ease with dressing as evidenced by decreasing PPT#4 to 22 secs or less    Baseline 27.63    Time 4    Period Weeks    Status Achieved   03/06/20:  14.41sec with use of strategies                Plan - 03/06/20 1213    Clinical Impression Statement Pt verbalizes good understanding of adaptive strategies for ADLs and reports incr ease.  Pt requests d/c today.    OT Occupational Profile and History Problem Focused Assessment - Including review of records relating to presenting problem    Occupational performance deficits (Please refer to evaluation for details): ADL's;IADL's;Leisure;Social Participation    Body Structure / Function / Physical Skills ADL;UE functional  use;Balance;Flexibility;FMC;ROM;Gait;Coordination;GMC;Decreased knowledge of precautions;Dexterity;Mobility;Tone;Strength    Rehab Potential Good    Clinical Decision Making Limited treatment options, no task modification necessary    Comorbidities Affecting Occupational Performance: None    Modification or Assistance to Complete Evaluation  No modification of tasks or assist necessary to complete eval    OT Frequency 2x / week    OT Duration 4 weeks   plus eval   OT Treatment/Interventions Self-care/ADL training;Energy conservation;Aquatic Therapy;DME and/or AE instruction;Patient/family education;Balance training;Passive range of motion;Paraffin;Gait Training;Fluidtherapy;Functional Mobility Training;Moist Heat;Therapeutic exercise;Manual Therapy;Therapeutic activities;Neuromuscular education    Plan d/c OT, reccommend screen in approx 5-7 months    Consulted and Agree with Plan of Care Patient           Patient will benefit from skilled therapeutic intervention in order to improve the following deficits and impairments:   Body Structure / Function / Physical Skills: ADL, UE functional use, Balance, Flexibility, FMC, ROM, Gait, Coordination, GMC, Decreased knowledge of precautions, Dexterity, Mobility, Tone, Strength       Visit Diagnosis: Other symptoms and signs involving the nervous system  Other lack of coordination  Other symptoms and signs involving the musculoskeletal system  Abnormal posture  Stiffness of right elbow, not elsewhere classified  Stiffness of left elbow, not elsewhere classified  Unsteadiness on feet    Problem List Patient Active Problem List   Diagnosis Date Noted  . Atrial flutter with rapid ventricular response ([place]) 08/18/2019  . New onset atrial flutter ([place]) 08/16/2019  . Goals of care, counseling/discussion 07/17/2019  . Prostate cancer metastatic to bone ([place]) 05/24/2019  . Establishing care with new doctor, encounter for 09/26/2018  .  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 Parkinson's disease ([place]) 11/07/2015  . 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 Hx of colonic polyps 05/18/2014  . BPH (benign prostatic hyperplasia) 01/12/2014  . Sleep apnea treated with nocturnal BiPAP 01/17/2012  . 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    OCCUPATIONAL THERAPY DISCHARGE SUMMARY  Visits from Start of Care: 6  Current functional level related to goals / functional outcomes: See above   Remaining deficits: Bradykinesia, rigidity, decr coordination, abnormal posture, decr balance/functional mobility, Tremors   Education / Equipment: Pt was instructed in the following:  PD-specific HEP, adaptive strategies for ADLs/IADLs, energy conservation strategies.  Pt verbalized understanding of all education provided.   Plan: Patient  agrees to discharge.  Patient goals were partially met. Patient is being discharged due to being pleased with the current functional level.  Pt would benefit from occupational therapy screen  in approx 5-7 months to assess for need for further therapy/functional changes due to progressive nature of diagnosis.     [facility] 03/06/2020, 12:17 PM  [facility] [address] [place], [place], [address] Phone: [phone]   Fax:  [phone]  Name: [name] [name] MRN: [id number] Date of Birth: 05-Feb-1941   Vianne Bulls, OTR/L [facility] [address] [place], [place]  [address] [phone] phone [phone] 03/06/20 12:17 PM

## 2020-03-08 ENCOUNTER — Ambulatory Visit: Payer: Medicare Other | Admitting: Physical Therapy

## 2020-03-08 ENCOUNTER — Ambulatory Visit: Payer: Medicare Other | Admitting: Occupational Therapy

## 2020-03-14 MED FILL — predniSONE 10 MG TABS: 10 | 30 days supply | Qty: 30 | Fill #2

## 2020-03-14 MED FILL — ABIRATERONE ACETATE 250 MG: 250 | 30 days supply | Qty: 120 | Fill #4

## 2020-03-18 ENCOUNTER — Other Ambulatory Visit: Payer: Medicare Other

## 2020-03-25 ENCOUNTER — Inpatient Hospital Stay: Payer: Medicare Other | Attending: Internal Medicine

## 2020-03-25 ENCOUNTER — Other Ambulatory Visit: Payer: Self-pay

## 2020-03-25 ENCOUNTER — Ambulatory Visit: Payer: Self-pay | Admitting: Urology

## 2020-03-25 DIAGNOSIS — R232 Flushing: Secondary | ICD-10-CM | POA: Diagnosis not present

## 2020-03-25 DIAGNOSIS — G20 Parkinson's disease: Secondary | ICD-10-CM | POA: Diagnosis not present

## 2020-03-25 DIAGNOSIS — Z8249 Family history of ischemic heart disease and other diseases of the circulatory system: Secondary | ICD-10-CM | POA: Diagnosis not present

## 2020-03-25 DIAGNOSIS — C61 Malignant neoplasm of prostate: Secondary | ICD-10-CM | POA: Insufficient documentation

## 2020-03-25 DIAGNOSIS — Z7901 Long term (current) use of anticoagulants: Secondary | ICD-10-CM | POA: Insufficient documentation

## 2020-03-25 DIAGNOSIS — Z79899 Other long term (current) drug therapy: Secondary | ICD-10-CM | POA: Insufficient documentation

## 2020-03-25 DIAGNOSIS — Z833 Family history of diabetes mellitus: Secondary | ICD-10-CM | POA: Diagnosis not present

## 2020-03-25 DIAGNOSIS — Z87891 Personal history of nicotine dependence: Secondary | ICD-10-CM | POA: Insufficient documentation

## 2020-03-25 DIAGNOSIS — C7951 Secondary malignant neoplasm of bone: Secondary | ICD-10-CM | POA: Insufficient documentation

## 2020-03-25 DIAGNOSIS — I4891 Unspecified atrial fibrillation: Secondary | ICD-10-CM | POA: Diagnosis not present

## 2020-03-25 DIAGNOSIS — N183 Chronic kidney disease, stage 3 unspecified: Secondary | ICD-10-CM | POA: Insufficient documentation

## 2020-03-25 DIAGNOSIS — Z803 Family history of malignant neoplasm of breast: Secondary | ICD-10-CM | POA: Diagnosis not present

## 2020-03-25 DIAGNOSIS — K5900 Constipation, unspecified: Secondary | ICD-10-CM | POA: Diagnosis not present

## 2020-03-25 LAB — CBC WITH DIFFERENTIAL/PLATELET
Abs Immature Granulocytes: 0.06 10*3/uL (ref 0.00–0.07)
Basophils Absolute: 0.1 10*3/uL (ref 0.0–0.1)
Basophils Relative: 1 %
Eosinophils Absolute: 0.1 10*3/uL (ref 0.0–0.5)
Eosinophils Relative: 1 %
HCT: 39.7 % (ref 39.0–52.0)
Hemoglobin: 13.4 g/dL (ref 13.0–17.0)
Immature Granulocytes: 1 %
Lymphocytes Relative: 10 %
Lymphs Abs: 1 10*3/uL (ref 0.7–4.0)
MCH: 31.4 pg (ref 26.0–34.0)
MCHC: 33.8 g/dL (ref 30.0–36.0)
MCV: 93 fL (ref 80.0–100.0)
Monocytes Absolute: 0.6 10*3/uL (ref 0.1–1.0)
Monocytes Relative: 6 %
Neutro Abs: 8.1 10*3/uL — ABNORMAL HIGH (ref 1.7–7.7)
Neutrophils Relative %: 81 %
Platelets: 203 10*3/uL (ref 150–400)
RBC: 4.27 MIL/uL (ref 4.22–5.81)
RDW: 13.2 % (ref 11.5–15.5)
WBC: 9.9 10*3/uL (ref 4.0–10.5)
nRBC: 0 % (ref 0.0–0.2)

## 2020-03-25 LAB — COMPREHENSIVE METABOLIC PANEL
ALT: 8 U/L (ref 0–44)
AST: 28 U/L (ref 15–41)
Albumin: 3.8 g/dL (ref 3.5–5.0)
Alkaline Phosphatase: 61 U/L (ref 38–126)
Anion gap: 9 (ref 5–15)
BUN: 21 mg/dL (ref 8–23)
CO2: 25 mmol/L (ref 22–32)
Calcium: 9.5 mg/dL (ref 8.9–10.3)
Chloride: 104 mmol/L (ref 98–111)
Creatinine, Ser: 1.46 mg/dL — ABNORMAL HIGH (ref 0.61–1.24)
GFR calc Af Amer: 53 mL/min — ABNORMAL LOW (ref >60)
GFR calc non Af Amer: 45 mL/min — ABNORMAL LOW (ref >60)
Glucose, Bld: 128 mg/dL — ABNORMAL HIGH (ref 70–99)
Potassium: 3.6 mmol/L (ref 3.5–5.1)
Sodium: 138 mmol/L (ref 135–145)
Total Bilirubin: 1.8 mg/dL — ABNORMAL HIGH (ref 0.3–1.2)
Total Protein: 6.5 g/dL (ref 6.5–8.1)

## 2020-03-25 LAB — PSA: Prostatic Specific Antigen: 0.45 ng/mL (ref 0.00–4.00)

## 2020-03-26 ENCOUNTER — Other Ambulatory Visit: Payer: Self-pay

## 2020-03-26 DIAGNOSIS — C61 Malignant neoplasm of prostate: Secondary | ICD-10-CM

## 2020-03-26 DIAGNOSIS — M5126 Other intervertebral disc displacement, lumbar region: Secondary | ICD-10-CM | POA: Diagnosis not present

## 2020-03-26 DIAGNOSIS — M5136 Other intervertebral disc degeneration, lumbar region: Secondary | ICD-10-CM | POA: Diagnosis not present

## 2020-03-26 DIAGNOSIS — M5416 Radiculopathy, lumbar region: Secondary | ICD-10-CM | POA: Diagnosis not present

## 2020-03-27 ENCOUNTER — Inpatient Hospital Stay (HOSPITAL_BASED_OUTPATIENT_CLINIC_OR_DEPARTMENT_OTHER): Payer: Medicare Other | Admitting: Internal Medicine

## 2020-03-27 ENCOUNTER — Encounter: Payer: Self-pay | Admitting: Internal Medicine

## 2020-03-27 ENCOUNTER — Other Ambulatory Visit: Payer: Self-pay

## 2020-03-27 ENCOUNTER — Ambulatory Visit (INDEPENDENT_AMBULATORY_CARE_PROVIDER_SITE_OTHER): Payer: Medicare Other | Admitting: Urology

## 2020-03-27 ENCOUNTER — Encounter: Payer: Self-pay | Admitting: Urology

## 2020-03-27 VITALS — BP 110/72 | HR 55 | Ht 68.5 in | Wt 164.5 lb

## 2020-03-27 DIAGNOSIS — G20 Parkinson's disease: Secondary | ICD-10-CM | POA: Diagnosis not present

## 2020-03-27 DIAGNOSIS — C7951 Secondary malignant neoplasm of bone: Secondary | ICD-10-CM | POA: Diagnosis not present

## 2020-03-27 DIAGNOSIS — N183 Chronic kidney disease, stage 3 unspecified: Secondary | ICD-10-CM | POA: Diagnosis not present

## 2020-03-27 DIAGNOSIS — R232 Flushing: Secondary | ICD-10-CM | POA: Diagnosis not present

## 2020-03-27 DIAGNOSIS — C61 Malignant neoplasm of prostate: Secondary | ICD-10-CM

## 2020-03-27 DIAGNOSIS — N401 Enlarged prostate with lower urinary tract symptoms: Secondary | ICD-10-CM

## 2020-03-27 DIAGNOSIS — N138 Other obstructive and reflux uropathy: Secondary | ICD-10-CM | POA: Diagnosis not present

## 2020-03-27 DIAGNOSIS — I4891 Unspecified atrial fibrillation: Secondary | ICD-10-CM | POA: Diagnosis not present

## 2020-03-27 LAB — BLADDER SCAN AMB NON-IMAGING

## 2020-03-27 MED ORDER — LEUPROLIDE ACETATE (6 MONTH) 45 MG ~~LOC~~ KIT
45.0000 mg | PACK | Freq: Once | SUBCUTANEOUS | Status: AC
  Administered 2020-03-27: 45 mg via SUBCUTANEOUS

## 2020-03-27 NOTE — Progress Notes (Signed)
   03/27/2020 11:03 AM   [name] [birth year] [phone]  Reason for visit: Follow up prostate cancer, urinary symptoms  HPI: I saw Mr. Schaum back in urology clinic today for follow-up of prostate cancer.  He is a 79 year old male with Parkinson's disease, solitary right kidney, and metastatic prostate cancer(diagnosed August 2020) to the left ischial tuberosity/ilium currently treated with ADT(urology), Zytiga/prednisone(oncology).  He also has a history of bothersome urinary symptoms and incomplete bladder emptying, as well as a history of a PVP with an outside urologist a few years ago.  He reports that his urinary symptoms have significantly improved after being on ADT for about a year now, and he really denies any bothersome urinary symptoms today.  He is very satisfied with his urination at this time, and PVR is normal at 95 mL.  He is also had a excellent PSA response to treatment, and most recent PSA was 0.45 in July 2021.  Continue follow-up with oncology RTC 6 months for ADT(lifelong), and PVR   Billey Co, MD  [facility] [address], [place], [place] [address] [phone]

## 2020-03-27 NOTE — Assessment & Plan Note (Addendum)
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 On Lupron [urology/ q55m;last 03/27/2020] + Zytiga+prednsione.  July  2021-0.43 trending down; STABLE.   # continue Zytiga+prednisone; Labs today reviewed; order bone scan today.   # Constipation--improved;  [id number]; Dr.Skulskie]; miralax;   # A. 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   # HOt flashes-grade 1- STABLE.   # DISPOSITION: # follow up in 6 weeks-MD; Labs-1-2 day prior- cbc/cmp/PSA;bone scan prior-Dr.B

## 2020-03-27 NOTE — Patient Instructions (Signed)
Please continue calcium and vitamin D supplements while on Eligard therapy   Leuprolide depot injection What is this medicine? LEUPROLIDE (loo PROE lide) is a man-made protein that acts like a natural hormone in the body. It decreases testosterone in men and decreases estrogen in women. In men, this medicine is used to treat advanced prostate cancer. In women, some forms of this medicine may be used to treat endometriosis, uterine fibroids, or other male hormone-related problems. This medicine may be used for other purposes; ask your health care provider or pharmacist if you have questions. COMMON BRAND NAME(S): Eligard, Fensolv, Lupron Depot, Lupron Depot-Ped, Viadur What should I tell my health care provider before I take this medicine? They need to know if you have any of these conditions:  diabetes  heart disease or previous heart attack  high blood pressure  high cholesterol  mental illness  osteoporosis  pain or difficulty passing urine  seizures  spinal cord metastasis  stroke  suicidal thoughts, plans, or attempt; a previous suicide attempt by you or a family member  tobacco smoker  unusual vaginal bleeding (women)  an unusual or allergic reaction to leuprolide, benzyl alcohol, other medicines, foods, dyes, or preservatives  pregnant or trying to get pregnant  breast-feeding How should I use this medicine? This medicine is for injection into a muscle or for injection under the skin. It is given by a health care professional in a hospital or clinic setting. The specific product will determine how it will be given to you. Make sure you understand which product you receive and how often you will receive it. Talk to your pediatrician regarding the use of this medicine in children. Special care may be needed. Overdosage: If you think you have taken too much of this medicine contact a poison control center or emergency room at once. NOTE: This medicine is only for  you. Do not share this medicine with others. What if I miss a dose? It is important not to miss a dose. Call your doctor or health care professional if you are unable to keep an appointment. Depot injections: Depot injections are given either once-monthly, every 12 weeks, every 16 weeks, or every 24 weeks depending on the product you are prescribed. The product you are prescribed will be based on if you are male or male, and your condition. Make sure you understand your product and dosing. What may interact with this medicine? Do not take this medicine with any of the following medications:  chasteberry This medicine may also interact with the following medications:  herbal or dietary supplements, like black cohosh or DHEA  male hormones, like estrogens or progestins and birth control pills, patches, rings, or injections  male hormones, like testosterone This list may not describe all possible interactions. Give your health care provider a list of all the medicines, herbs, non-prescription drugs, or dietary supplements you use. Also tell them if you smoke, drink alcohol, or use illegal drugs. Some items may interact with your medicine. What should I watch for while using this medicine? Visit your doctor or health care professional for regular checks on your progress. During the first weeks of treatment, your symptoms may get worse, but then will improve as you continue your treatment. You may get hot flashes, increased bone pain, increased difficulty passing urine, or an aggravation of nerve symptoms. Discuss these effects with your doctor or health care professional, some of them may improve with continued use of this medicine. Male patients may experience a  menstrual cycle or spotting during the first months of therapy with this medicine. If this continues, contact your doctor or health care professional. This medicine may increase blood sugar. Ask your healthcare provider if changes in  diet or medicines are needed if you have diabetes. What side effects may I notice from receiving this medicine? Side effects that you should report to your doctor or health care professional as soon as possible:  allergic reactions like skin rash, itching or hives, swelling of the face, lips, or tongue  breathing problems  chest pain  depression or memory disorders  pain in your legs or groin  pain at site where injected or implanted  seizures  severe headache  signs and symptoms of high blood sugar such as being more thirsty or hungry or having to urinate more than normal. You may also feel very tired or have blurry vision  swelling of the feet and legs  suicidal thoughts or other mood changes  visual changes  vomiting Side effects that usually do not require medical attention (report to your doctor or health care professional if they continue or are bothersome):  breast swelling or tenderness  decrease in sex drive or performance  diarrhea  hot flashes  loss of appetite  muscle, joint, or bone pains  nausea  redness or irritation at site where injected or implanted  skin problems or acne This list may not describe all possible side effects. Call your doctor for medical advice about side effects. You may report side effects to FDA at 1-800-FDA-1088. Where should I keep my medicine? This drug is given in a hospital or clinic and will not be stored at home. NOTE: This sheet is a summary. It may not cover all possible information. If you have questions about this medicine, talk to your doctor, pharmacist, or health care provider.  2020 Elsevier/Gold Standard (2018-06-23 09:27:03)

## 2020-03-27 NOTE — Progress Notes (Signed)
Eligard SubQ Injection   Due to Prostate Cancer patient is present today for a Eligard Injection.  Medication: Eligard 6 month Dose: 45 mg  Location: Abdominal tissue  Lot: [id number] Exp: 08/2021  Patient tolerated well, no complications were noted.  Performed by: Gordy Clement, CMA   Patient's next follow up was scheduled for 09/30/2020. This appointment was scheduled using wheel and given to patient today along with reminder continue on Vitamin D 800-1000iu and Calium 1000-1200mg  daily while on Androgen Deprivation Therapy.  PA approval dates: No PA needed

## 2020-03-27 NOTE — Progress Notes (Signed)
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 Sclerotic 1.5 cm left ischial lesion/ Sclerotic medial left iliac bone 1.5 cm lesion (series 2/image 47), increased from 1.0 cm. Gleason score of 4+5= 9; with almost all cores involved greater than 80%.  9/16 Lupron 90-month depot on 9/16. [Urology; Dr.Siniski]  # MID OCT 2020- Zytiga 1000 mg+ prednisone  # Parkinsons's syndrome [Dr.Tat; [place]; neurologist]; CKD-III [creat1.3-1.5]  # GC- referred  # DECLINES- Palliative care [316/2021]  DIAGNOSIS: Prostate cancer  STAGE:     4    ;  GOALS: Palliative/control  CURRENT/MOST RECENT THERAPY : Lupron+ Zytiga      Prostate cancer metastatic to bone ([place])  05/24/2019 Initial Diagnosis   Prostate cancer metastatic to bone (H[facility])    HISTORY OF PRESENTING ILLNESS:  [name] [name] 79 y.o.  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  Patient denies any leg swelling.  Denies any recent worsening of fatigue.  Constipation improved.  No blood in stools no blood per stools.     Review of Systems  Constitutional: Negative for chills, diaphoresis, fever, malaise/fatigue and weight loss.  HENT: Negative for nosebleeds and sore throat.   Eyes: Negative for double vision.  Respiratory: Negative for cough, hemoptysis, sputum production, shortness of breath and wheezing.   Cardiovascular: Negative for chest pain, palpitations, orthopnea and leg swelling.  Gastrointestinal: Positive for constipation. Negative for abdominal pain, blood in stool, diarrhea,  heartburn, melena, nausea and vomiting.  Genitourinary: Positive for frequency and urgency. Negative for dysuria.  Musculoskeletal: Negative for back pain and joint pain.  Skin: Negative.  Negative for itching and rash.  Neurological: Positive for tremors. Negative for dizziness, tingling, focal weakness, weakness and headaches.  Endo/Heme/Allergies: Does not bruise/bleed easily.  Psychiatric/Behavioral: Negative for depression. The patient is not nervous/anxious and does not have insomnia.     MEDICAL HISTORY:  Past Medical History:  Diagnosis Date  . Parkinson's disease ([place])   . REM sleep behavior disorder   . Sleep apnea     SURGICAL HISTORY: Past Surgical History:  Procedure Laterality Date  . APPENDECTOMY    . 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 Marital status: Married    Spouse name: Not on file  . Number of children: Not on file  . Years of education: Not on file  . Highest education level: Not on file  Occupational History  . Occupation: retired    Comment: Nutritional therapist  Tobacco Use  . Smoking status: Former Smoker    Types: Cigarettes, Cigars    Quit date: 09/07/2010    Years since quitting: 9.5  . Smokeless tobacco: Never Used  Vaping Use  . Vaping Use: Never used  Substance and Sexual Activity  . Alcohol use: Yes    Comment: 1 can beer/day  . Drug use: Never  . Sexual activity: Not Currently  Other Topics Concern  . 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    Social Determinants of  Health   Financial Resource Strain:   . Difficulty of Paying Living Expenses:   Food Insecurity:   . Worried About Charity fundraiser in the Last Year:   . Arboriculturist in the Last Year:   Transportation Needs:   . Film/video editor (Medical):   Marland Kitchen Lack of Transportation (Non-Medical):    Physical Activity:   . Days of Exercise per Week:   . Minutes of Exercise per Session:   Stress:   . Feeling of Stress :   Social Connections:   . Frequency of Communication with Friends and Family:   . Frequency of Social Gatherings with Friends and Family:   . Attends Religious Services:   . Active Member of Clubs or Organizations:   . Attends Archivist Meetings:   Marland Kitchen Marital Status:   Intimate Partner Violence:   . Fear of Current or Ex-Partner:   . Emotionally Abused:   Marland Kitchen Physically Abused:   . Sexually Abused:     FAMILY HISTORY: Family History  Problem Relation Age of Onset  . Heart disease Maternal Grandfather   . Diabetes Paternal Grandfather   . Breast cancer Daughter     ALLERGIES:  is allergic to influenza vaccines.  MEDICATIONS:  Current Outpatient Medications  Medication Sig Dispense Refill  . abiraterone acetate (ZYTIGA) 250 MG tablet TAKE 4 TABLETS (1,000 MG TOTAL) BY MOUTH DAILY. TAKE ON AN EMPTY STOMACH 1 HOUR BEFORE OR 2 HOURS AFTER A MEAL 120 tablet 4  . apixaban (ELIQUIS) 5 MG TABS tablet Take 1 tablet (5 mg total) by mouth 2 (two) times daily. 60 tablet 1  . carbidopa-levodopa (SINEMET IR) 25-100 MG tablet Take 2 tablets by mouth as directed. TAKE 2 tablets at 7AM, 2 tablet at 11AM, 2 tablets at 3PM, 1 tablet at 7PM    . clonazePAM (KLONOPIN) 0.5 MG tablet Take 0.5 mg by mouth at bedtime. Dr Lisabeth Devoid    . diltiazem (CARDIZEM CD) 120 MG 24 hr capsule Take 1 capsule (120 mg total) by mouth daily. 30 capsule 2  . Glycerin-Hypromellose-PEG 400 (VISINE DRY EYE OP) Apply to eye. otc    . ketoconazole (NIZORAL) 2 % shampoo Apply 1 application topically 2 (two) times a week.     . methocarbamol (ROBAXIN) 500 MG tablet 1/2-1 po qHS prn    . Opicapone 50 MG CAPS Take 50 mg by mouth daily. 90 capsule 2  . predniSONE (DELTASONE) 10 MG tablet TAKE 1 TABLET (10 MG TOTAL) BY MOUTH DAILY WITH BREAKFAST. 30 tablet 6  . tamsulosin (FLOMAX) 0.4 MG CAPS capsule  Take 1 capsule (0.4 mg total) by mouth daily. (Patient taking differently: Take 0.4 mg by mouth daily. [place]) 90 capsule 3   No current facility-administered medications for this visit.      Marland Kitchen  PHYSICAL EXAMINATION: ECOG PERFORMANCE STATUS: 0 - Asymptomatic  Vitals:   03/27/20 1027  BP: 114/64  Pulse: (!) 53  Resp: 16  Temp: 98.4 F (36.9 C)  SpO2: 97%   Filed Weights   03/27/20 1027  Weight: 164 lb (74.4 kg)    Physical Exam HENT:     Head: Normocephalic and atraumatic.     Mouth/Throat:     Pharynx: No oropharyngeal exudate.  Eyes:     Pupils: Pupils are equal, round, and reactive to light.  Cardiovascular:     Rate and Rhythm: Normal rate and regular rhythm.  Pulmonary:     Effort: No respiratory distress.  Breath sounds: No wheezing.  Abdominal:     General: Bowel sounds are normal. There is no distension.     Palpations: Abdomen is soft. There is no mass.     Tenderness: There is no abdominal tenderness. There is no guarding or rebound.  Musculoskeletal:        General: No tenderness. Normal range of motion.     Cervical back: Normal range of motion and neck supple.  Skin:    General: Skin is warm.  Neurological:     Mental Status: He is alert and oriented to person, place, and time.     Comments: Tremor of right upper extremity.  Psychiatric:        Mood and Affect: Affect normal.      LABORATORY DATA:  I have reviewed the data as listed Lab Results  Component Value Date   WBC 9.9 03/25/2020   HGB 13.4 03/25/2020   HCT 39.7 03/25/2020   MCV 93.0 03/25/2020   PLT 203 03/25/2020   Recent Labs    01/01/20 1438 02/12/20 1053 03/25/20 1048  NA 140 138 138  K 3.9 4.1 3.6  CL 106 102 104  CO2 23 27 25   GLUCOSE 138* 132* 128*  BUN 19 25* 21  CREATININE 1.50* 1.77* 1.46*  CALCIUM 9.6 9.7 9.5  GFRNONAA 44* 36* 45*  GFRAA 51* 42* 53*  PROT 7.2 6.9 6.5  ALBUMIN 4.2 4.0 3.8  AST 32 42* 28  ALT 13 6 8   ALKPHOS 67 63 61  BILITOT 1.6*  1.6* 1.8*    RADIOGRAPHIC STUDIES: I have personally reviewed the radiological images as listed and agreed with the findings in the report. No results found.  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 On Lupron [urology/ q74m;last 03/27/2020] + Zytiga+prednsione.  July  2021-0.43 trending down; STABLE.   # continue Zytiga+prednisone; Labs today reviewed; order bone scan today.   # Constipation--improved;  [id number]; Dr.Skulskie]; miralax;   # A. 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   # HOt flashes-grade 1- STABLE.   # DISPOSITION: # follow up in 6 weeks-MD; Labs-1-2 day prior- cbc/cmp/PSA;bone scan prior-Dr.B   All questions were answered. The patient knows to call the clinic with any problems, questions or concerns.    Cammie Sickle, MD 03/27/2020 7:19 PM

## 2020-03-28 ENCOUNTER — Other Ambulatory Visit: Payer: Self-pay | Admitting: Physical Medicine and Rehabilitation

## 2020-03-28 DIAGNOSIS — M5416 Radiculopathy, lumbar region: Secondary | ICD-10-CM

## 2020-04-10 ENCOUNTER — Ambulatory Visit (INDEPENDENT_AMBULATORY_CARE_PROVIDER_SITE_OTHER): Payer: Medicare Other | Admitting: Family Medicine

## 2020-04-10 ENCOUNTER — Encounter: Payer: Self-pay | Admitting: Family Medicine

## 2020-04-10 ENCOUNTER — Other Ambulatory Visit: Payer: Self-pay

## 2020-04-10 VITALS — BP 106/74 | HR 54 | Ht 68.5 in | Wt 165.0 lb

## 2020-04-10 DIAGNOSIS — I4892 Unspecified atrial flutter: Secondary | ICD-10-CM | POA: Diagnosis not present

## 2020-04-10 DIAGNOSIS — I700 Atherosclerosis of aorta: Secondary | ICD-10-CM

## 2020-04-10 DIAGNOSIS — B029 Zoster without complications: Secondary | ICD-10-CM | POA: Diagnosis not present

## 2020-04-10 MED ORDER — VALACYCLOVIR HCL 1 G PO TABS: 1000.0000 mg | ORAL_TABLET | Freq: Two times a day (BID) | ORAL | 0 refills | Status: DC

## 2020-04-10 NOTE — Progress Notes (Signed)
Date:  04/10/2020   Name:  [name] [name]   DOB:  Aug 20, 1941   MRN:  [id number]   Chief Complaint: Rash (on back, sore to touch - started over a week ago. )  Rash This is a new problem. The current episode started 1 to 4 weeks ago (2 weeks). The problem has been waxing and waning since onset. The affected locations include the back and torso. The rash is characterized by blistering and redness. Pertinent negatives include no cough, diarrhea, fever, shortness of breath or sore throat. Past treatments include antibiotic cream. The treatment provided no relief.    Lab Results  Component Value Date   CREATININE 1.46 (H) 03/25/2020   BUN 21 03/25/2020   NA 138 03/25/2020   K 3.6 03/25/2020   CL 104 03/25/2020   CO2 25 03/25/2020   Lab Results  Component Value Date   CHOL 216 (H) 08/17/2019   HDL 72 08/17/2019   LDLCALC 124 (H) 08/17/2019   TRIG 98 08/17/2019   CHOLHDL 3.0 08/17/2019   Lab Results  Component Value Date   TSH 1.217 08/16/2019   No results found for: HGBA1C Lab Results  Component Value Date   WBC 9.9 03/25/2020   HGB 13.4 03/25/2020   HCT 39.7 03/25/2020   MCV 93.0 03/25/2020   PLT 203 03/25/2020   Lab Results  Component Value Date   ALT 8 03/25/2020   AST 28 03/25/2020   ALKPHOS 61 03/25/2020   BILITOT 1.8 (H) 03/25/2020     Review of Systems  Constitutional: Negative for chills and fever.  HENT: Negative for drooling, ear discharge, ear pain and sore throat.   Respiratory: Negative for cough, shortness of breath and wheezing.   Cardiovascular: Negative for chest pain, palpitations and leg swelling.  Gastrointestinal: Negative for abdominal pain, blood in stool, constipation, diarrhea and nausea.  Endocrine: Negative for polydipsia.  Genitourinary: Negative for dysuria, frequency, hematuria and urgency.  Musculoskeletal: Negative for back pain, myalgias and neck pain.  Skin: Positive for rash.  Allergic/Immunologic: Negative for environmental  allergies.  Neurological: Negative for dizziness and headaches.  Hematological: Does not bruise/bleed easily.  Psychiatric/Behavioral: Negative for suicidal ideas. The patient is not nervous/anxious.     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 2nd time swollen arm Flu shot: 1974 anaphylaxis. 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  Current Meds  Medication Sig   abiraterone acetate (ZYTIGA) 250 MG tablet TAKE 4 TABLETS (1,000 MG TOTAL) BY MOUTH DAILY. TAKE ON AN EMPTY STOMACH 1 HOUR BEFORE OR 2 HOURS AFTER A MEAL   apixaban (ELIQUIS) 5 MG TABS tablet Take 1 tablet (5 mg total) by mouth 2 (two) times daily.   carbidopa-levodopa (SINEMET IR) 25-100 MG tablet Take 2 tablets by mouth as directed. TAKE  2 tablets at 7AM, 2 tablet at 11AM, 2 tablets at 3PM, 1 tablet at 7PM   clonazePAM (KLONOPIN) 0.5 MG tablet Take 0.5 mg by mouth at bedtime. Dr Lisabeth Devoid   diltiazem (CARDIZEM CD) 120 MG 24 hr capsule Take 1 capsule (120 mg total) by mouth daily.   Glycerin-Hypromellose-PEG 400 (VISINE DRY EYE OP) Apply to eye. otc   ketoconazole (NIZORAL) 2 % shampoo Apply 1 application topically 2 (two) times a week.    Opicapone 50 MG CAPS Take 50 mg by mouth daily.   tamsulosin (FLOMAX) 0.4 MG CAPS capsule Take 1 capsule (0.4 mg total) by mouth daily. (Patient taking differently: Take 0.4 mg by mouth daily. [place])    PHQ 2/9 Scores 04/10/2020 01/25/2020 11/22/2019 08/16/2019  PHQ - 2 Score 0 0 0 0  PHQ- 9 Score 0 - 0 0    GAD 7 : Generalized Anxiety Score 04/10/2020 11/22/2019 08/16/2019  Nervous, Anxious, on Edge 0 0 1  Control/stop worrying 0 0 0  Worry too much - different things 0 0 0  Trouble relaxing 0 0 0  Restless 0 0 0  Easily annoyed or irritable 0 0 1  Afraid - awful might happen 0 0 0  Total GAD 7 Score 0 0 2  Anxiety Difficulty Not difficult at all - Not difficult at all    BP Readings from Last 3 Encounters:  04/10/20 106/74  03/27/20 114/64  03/27/20 110/72    Physical Exam Vitals and nursing note reviewed.  HENT:     Head: Normocephalic.     Right Ear: Tympanic membrane, ear canal and external ear normal. There is no impacted cerumen.     Left Ear: Tympanic membrane, ear canal and external ear normal. There is no impacted cerumen.     Nose: Nose normal. No congestion or rhinorrhea.  Eyes:     General: No scleral icterus.       Right eye: No discharge.        Left eye: No discharge.     Conjunctiva/sclera: Conjunctivae normal.     Pupils: Pupils are equal, round, and reactive to light.  Neck:     Thyroid: No thyromegaly.     Vascular: No JVD.     Trachea: No tracheal deviation.  Cardiovascular:     Rate and Rhythm: Normal rate and regular rhythm.     Heart sounds:  Normal heart sounds. No murmur heard.  No friction rub. No gallop.   Pulmonary:     Effort: No respiratory distress.     Breath sounds: Normal breath sounds. No wheezing or rales.  Abdominal:     General: Bowel sounds are normal.     Palpations: Abdomen is soft. There is no mass.     Tenderness: There is no abdominal tenderness. There is no guarding or rebound.  Musculoskeletal:        General: No  tenderness. Normal range of motion.     Cervical back: Normal range of motion and neck supple.  Lymphadenopathy:     Cervical: No cervical adenopathy.  Skin:    General: Skin is warm.     Findings: Erythema and rash present. Rash is vesicular.  Neurological:     Mental Status: He is alert and oriented to person, place, and time.     Cranial Nerves: No cranial nerve deficit.     Deep Tendon Reflexes: Reflexes are normal and symmetric.     Wt Readings from Last 3 Encounters:  04/10/20 165 lb (74.8 kg)  03/27/20 164 lb (74.4 kg)  03/27/20 164 lb 8 oz (74.6 kg)    BP 106/74    Pulse (!) 54    Ht 5' 8.5" (1.74 m)    Wt 165 lb (74.8 kg)    SpO2 96%    BMI 24.72 kg/m   Assessment and Plan:  1. Herpes zoster without complication New onset.  New onset.  Stable.  Uncomplicated.  1 area of rash.  If mild discomfort.  Will initiate Valtrex 1 g twice a day for 7 days.  2. Atherosclerosis of aorta ([facility]) Chronic.  Controlled.  Stable.  Will optimize control of blood pressure, lipid, and weight management.  3. Atrial flutter with rapid ventricular response ([facility]) Chronic.  Controlled.  Stable.  Currently followed by Dr.Parachos with any coagulation therapy/Eliquis.

## 2020-04-11 ENCOUNTER — Other Ambulatory Visit: Payer: Self-pay | Admitting: Internal Medicine

## 2020-04-11 DIAGNOSIS — C7951 Secondary malignant neoplasm of bone: Secondary | ICD-10-CM

## 2020-04-12 ENCOUNTER — Other Ambulatory Visit: Payer: Self-pay | Admitting: Internal Medicine

## 2020-04-12 NOTE — Telephone Encounter (Signed)
CBC with Differential Order: [id number] Status:  Final result Visible to patient:  Yes (seen) Next appt:  04/15/2020 at 05:00 PM in Radiology ([facility]) Dx:  Prostate cancer metastatic to bone ([place])  0 Result Notes  Ref Range & Units 2 wk ago 2 mo ago 3 mo ago 4 mo ago  WBC 4.0 - 10.5 K/uL 9.9  10.3  8.4  8.9   RBC 4.22 - 5.81 MIL/uL 4.27  4.24  4.55  4.27   Hemoglobin 13.0 - 17.0 g/dL 13.4  13.5  14.2  13.6   HCT 39 - 52 % 39.7  40.4  43.7  41.2   MCV 80.0 - 100.0 fL 93.0  95.3  96.0  96.5   MCH 26.0 - 34.0 pg 31.4  31.8  31.2  31.9   MCHC 30.0 - 36.0 g/dL 33.8  33.4  32.5  33.0   RDW 11.5 - 15.5 % 13.2  13.5  13.1  12.9   Platelets 150 - 400 K/uL 203  237  237  217   nRBC 0.0 - 0.2 % 0.0  0.0  0.0  0.0   Neutrophils Relative % % 81  82  83  85   Neutro Abs 1.7 - 7.7 K/uL 8.1High  8.5High  7.0  7.5   Lymphocytes Relative % 10  9  11  11    Lymphs Abs 0.7 - 4.0 K/uL 1.0  0.9  0.9  1.0   Monocytes Relative % 6  6  4  3    Monocytes Absolute 0 - 1 K/uL 0.6  0.6  0.4  0.3   Eosinophils Relative % 1  1  0  0   Eosinophils Absolute 0 - 0 K/uL 0.1  0.1  0.0  0.0   Basophils Relative % 1  1  1   0   Basophils Absolute 0 - 0 K/uL 0.1  0.1  0.0  0.0   Immature Granulocytes % 1  1  1  1    Abs Immature Granulocytes 0.00 - 0.07 K/uL 0.06  0.10High CM  0.05 CM  0.06 CM   Comment: Performed at [facility], [address] [place], [place] [address]  [place]  [facility] CLIN LAB [place] CLIN LAB [place] CLIN LAB [facility] CLIN LAB      Specimen Collected: 03/25/20 10:48 Last Resulted: 03/25/20 10:58     Lab Flowsheet   Order Details   View Encounter   Lab and Collection Details   Routing   Result History     CM=Additional comments    Result Care Coordination  Patient Communication  Add Comments Seen Back to Top      Other Results from 03/25/2020  PSA  Status:  Final result Visible to patient:  Yes (not seen) Next appt:  04/15/2020 at 05:00 PM in Radiology ([facility]) Dx:   Prostate cancer metastatic to bone [facility]) Order: [id number]  0 Result Notes  Ref Range & Units 2 wk ago 2 mo ago 3 mo ago 4 mo ago  Prostatic Specific Antigen 0.00 - 4.00 ng/mL 0.45  0.53 CM  0.68 CM  0.80 CM   Comment: (NOTE)  While PSA levels of <=4.0 ng/ml are reported as reference range, some  men with levels below 4.0 ng/ml can have prostate cancer and many men  with PSA above 4.0 ng/ml do not have prostate cancer. Other tests  such as free PSA, age specific reference ranges, PSA velocity and PSA  doubling time may be  helpful especially in men less than 56 years  old.  Performed at [facility], [place] [address], [place], [place]  [address]   Resulting Agency  [place] CLIN LAB [place] CLIN LAB [place] CLIN LAB [facility] CLIN LAB      Specimen Collected: 03/25/20 10:48 Last Resulted: 03/25/20 13:28     Lab Flowsheet   Order Details   View Encounter   Lab and Collection Details   Routing   Result History     CM=Additional comments    Result Care Coordination  Patient Communication  Add Comments Add Notifications Back to Top        Contains abnormal dataComprehensive metabolic panel  Status:  Final result Visible to patient:  Yes (seen) Next appt:  04/15/2020 at 05:00 PM in Radiology ([facility]) Dx:  Prostate cancer metastatic to bone [facility]) Order: [id number]  0 Result Notes  Ref Range & Units 2 wk ago 2 mo ago 3 mo ago 4 mo ago  Sodium 135 - 145 mmol/L 138  138  140  140   Potassium 3.5 - 5.1 mmol/L 3.6  4.1  3.9  3.9   Chloride 98 - 111 mmol/L 104  102  106  106   CO2 22 - 32 mmol/L 25  27  23  23    Glucose, Bld 70 - 99 mg/dL 128High  132High CM  138High CM  133High CM   Comment: Glucose reference range applies only to samples taken after fasting for at least 8 hours.  BUN 8 - 23 mg/dL 21  25High  19  15   Creatinine, Ser 0.61 - 1.24 mg/dL 1.46High  1.77High  1.50High  1.57High   Calcium 8.9 - 10.3 mg/dL 9.5  9.7  9.6  9.6   Total Protein 6.5 - 8.1  g/dL 6.5  6.9  7.2  7.1   Albumin 3.5 - 5.0 g/dL 3.8  4.0  4.2  4.1   AST 15 - 41 U/L 28  42High  32  36   ALT 0 - 44 U/L 8  6  13  16    Alkaline Phosphatase 38 - 126 U/L 61  63  67  61   Total Bilirubin 0.3 - 1.2 mg/dL 1.8High  1.6High  1.6High  1.4High   GFR calc non Af Amer >60 mL/min 45Low  36Low  44Low  42Low   GFR calc Af Amer >60 mL/min 53Low  42Low  51Low  48Low   Anion gap 5 - 15 9  9  CM  11 CM  11 CM   Comment: Performed at [facility], [address] [place], [place] [address]  Resulting Agency  [facility] CLIN LAB [facility] CLIN LAB [facility] CLIN LAB [facility] CLIN LAB      Specimen Collected: 03/25/20 10:48 Last Resulted: 03/25/20 11:09

## 2020-04-15 ENCOUNTER — Other Ambulatory Visit: Payer: Self-pay

## 2020-04-15 ENCOUNTER — Ambulatory Visit
Admission: RE | Admit: 2020-04-15 | Discharge: 2020-04-15 | Disposition: A | Discharge status: Home / Self Care | Payer: Medicare Other | Source: Ambulatory Visit | Attending: Physical Medicine and Rehabilitation | Admitting: Physical Medicine and Rehabilitation

## 2020-04-15 DIAGNOSIS — B351 Tinea unguium: Secondary | ICD-10-CM | POA: Diagnosis not present

## 2020-04-15 DIAGNOSIS — M778 Other enthesopathies, not elsewhere classified: Secondary | ICD-10-CM | POA: Diagnosis not present

## 2020-04-15 DIAGNOSIS — M5416 Radiculopathy, lumbar region: Secondary | ICD-10-CM | POA: Diagnosis not present

## 2020-04-15 DIAGNOSIS — M545 Low back pain: Secondary | ICD-10-CM | POA: Diagnosis not present

## 2020-04-15 DIAGNOSIS — L600 Ingrowing nail: Secondary | ICD-10-CM | POA: Diagnosis not present

## 2020-04-16 DIAGNOSIS — G4733 Obstructive sleep apnea (adult) (pediatric): Secondary | ICD-10-CM | POA: Diagnosis not present

## 2020-04-16 DIAGNOSIS — G4752 REM sleep behavior disorder: Secondary | ICD-10-CM | POA: Diagnosis not present

## 2020-04-16 DIAGNOSIS — Z9989 Dependence on other enabling machines and devices: Secondary | ICD-10-CM | POA: Diagnosis not present

## 2020-04-18 DIAGNOSIS — M5416 Radiculopathy, lumbar region: Secondary | ICD-10-CM | POA: Diagnosis not present

## 2020-04-18 DIAGNOSIS — M5126 Other intervertebral disc displacement, lumbar region: Secondary | ICD-10-CM | POA: Diagnosis not present

## 2020-04-18 DIAGNOSIS — M5136 Other intervertebral disc degeneration, lumbar region: Secondary | ICD-10-CM | POA: Diagnosis not present

## 2020-04-18 MED FILL — ABIRATERONE ACETATE 250 MG: 250 | 30 days supply | Qty: 120 | Fill #0

## 2020-04-18 MED FILL — predniSONE 10 MG TABS: 10 | 30 days supply | Qty: 30 | Fill #3

## 2020-04-20 ENCOUNTER — Other Ambulatory Visit: Payer: Self-pay | Admitting: Urology

## 2020-04-20 DIAGNOSIS — N400 Enlarged prostate without lower urinary tract symptoms: Secondary | ICD-10-CM

## 2020-04-21 NOTE — Telephone Encounter (Signed)
Dr. Doristine Counter last note stated his urinary symptoms have improved after ADT.  Is he still wanting a refill on Flomax?

## 2020-04-24 ENCOUNTER — Ambulatory Visit: Payer: Medicare Other | Admitting: Internal Medicine

## 2020-04-30 DIAGNOSIS — I959 Hypotension, unspecified: Secondary | ICD-10-CM | POA: Diagnosis not present

## 2020-04-30 DIAGNOSIS — I483 Typical atrial flutter: Secondary | ICD-10-CM | POA: Diagnosis not present

## 2020-04-30 DIAGNOSIS — R002 Palpitations: Secondary | ICD-10-CM | POA: Diagnosis not present

## 2020-05-06 ENCOUNTER — Inpatient Hospital Stay: Payer: Medicare Other | Attending: Internal Medicine

## 2020-05-06 ENCOUNTER — Encounter
Admission: RE | Admit: 2020-05-06 | Discharge: 2020-05-06 | Disposition: A | Discharge status: Home / Self Care | Payer: Medicare Other | Source: Ambulatory Visit | Attending: Internal Medicine | Admitting: Internal Medicine

## 2020-05-06 ENCOUNTER — Ambulatory Visit
Admission: RE | Admit: 2020-05-06 | Discharge: 2020-05-06 | Disposition: A | Discharge status: Home / Self Care | Payer: Medicare Other | Source: Ambulatory Visit | Attending: Internal Medicine | Admitting: Internal Medicine

## 2020-05-06 ENCOUNTER — Other Ambulatory Visit: Payer: Self-pay

## 2020-05-06 DIAGNOSIS — C7951 Secondary malignant neoplasm of bone: Secondary | ICD-10-CM | POA: Diagnosis not present

## 2020-05-06 DIAGNOSIS — C61 Malignant neoplasm of prostate: Secondary | ICD-10-CM | POA: Insufficient documentation

## 2020-05-06 LAB — COMPREHENSIVE METABOLIC PANEL
ALT: 6 U/L (ref 0–44)
AST: 31 U/L (ref 15–41)
Albumin: 3.9 g/dL (ref 3.5–5.0)
Alkaline Phosphatase: 53 U/L (ref 38–126)
Anion gap: 9 (ref 5–15)
BUN: 21 mg/dL (ref 8–23)
CO2: 25 mmol/L (ref 22–32)
Calcium: 9.5 mg/dL (ref 8.9–10.3)
Chloride: 108 mmol/L (ref 98–111)
Creatinine, Ser: 1.6 mg/dL — ABNORMAL HIGH (ref 0.61–1.24)
GFR calc Af Amer: 47 mL/min — ABNORMAL LOW (ref >60)
GFR calc non Af Amer: 41 mL/min — ABNORMAL LOW (ref >60)
Glucose, Bld: 108 mg/dL — ABNORMAL HIGH (ref 70–99)
Potassium: 3.7 mmol/L (ref 3.5–5.1)
Sodium: 142 mmol/L (ref 135–145)
Total Bilirubin: 2.1 mg/dL — ABNORMAL HIGH (ref 0.3–1.2)
Total Protein: 6.7 g/dL (ref 6.5–8.1)

## 2020-05-06 LAB — CBC WITH DIFFERENTIAL/PLATELET
Abs Immature Granulocytes: 0.08 10*3/uL — ABNORMAL HIGH (ref 0.00–0.07)
Basophils Absolute: 0.1 10*3/uL (ref 0.0–0.1)
Basophils Relative: 1 %
Eosinophils Absolute: 0.2 10*3/uL (ref 0.0–0.5)
Eosinophils Relative: 2 %
HCT: 39.9 % (ref 39.0–52.0)
Hemoglobin: 13.5 g/dL (ref 13.0–17.0)
Immature Granulocytes: 1 %
Lymphocytes Relative: 14 %
Lymphs Abs: 1.4 10*3/uL (ref 0.7–4.0)
MCH: 31.8 pg (ref 26.0–34.0)
MCHC: 33.8 g/dL (ref 30.0–36.0)
MCV: 94.1 fL (ref 80.0–100.0)
Monocytes Absolute: 0.7 10*3/uL (ref 0.1–1.0)
Monocytes Relative: 7 %
Neutro Abs: 7.6 10*3/uL (ref 1.7–7.7)
Neutrophils Relative %: 75 %
Platelets: 221 10*3/uL (ref 150–400)
RBC: 4.24 MIL/uL (ref 4.22–5.81)
RDW: 14.4 % (ref 11.5–15.5)
WBC: 10 10*3/uL (ref 4.0–10.5)
nRBC: 0 % (ref 0.0–0.2)

## 2020-05-06 LAB — PSA: Prostatic Specific Antigen: 0.37 ng/mL (ref 0.00–4.00)

## 2020-05-06 MED ORDER — TECHNETIUM TC 99M MEDRONATE IV KIT
20.0000 | PACK | Freq: Once | INTRAVENOUS | Status: AC | PRN
  Administered 2020-05-06: 20.38 via INTRAVENOUS

## 2020-05-07 ENCOUNTER — Encounter: Payer: Self-pay | Admitting: Internal Medicine

## 2020-05-08 ENCOUNTER — Encounter: Payer: Self-pay | Admitting: Internal Medicine

## 2020-05-08 ENCOUNTER — Inpatient Hospital Stay: Payer: Medicare Other | Attending: Internal Medicine | Admitting: Internal Medicine

## 2020-05-08 ENCOUNTER — Other Ambulatory Visit: Payer: Self-pay

## 2020-05-08 DIAGNOSIS — Z87891 Personal history of nicotine dependence: Secondary | ICD-10-CM | POA: Diagnosis not present

## 2020-05-08 DIAGNOSIS — N183 Chronic kidney disease, stage 3 unspecified: Secondary | ICD-10-CM | POA: Insufficient documentation

## 2020-05-08 DIAGNOSIS — Z9049 Acquired absence of other specified parts of digestive tract: Secondary | ICD-10-CM | POA: Insufficient documentation

## 2020-05-08 DIAGNOSIS — G20 Parkinson's disease: Secondary | ICD-10-CM | POA: Insufficient documentation

## 2020-05-08 DIAGNOSIS — Z79899 Other long term (current) drug therapy: Secondary | ICD-10-CM | POA: Diagnosis not present

## 2020-05-08 DIAGNOSIS — C7951 Secondary malignant neoplasm of bone: Secondary | ICD-10-CM | POA: Diagnosis not present

## 2020-05-08 DIAGNOSIS — Z833 Family history of diabetes mellitus: Secondary | ICD-10-CM | POA: Diagnosis not present

## 2020-05-08 DIAGNOSIS — C61 Malignant neoplasm of prostate: Secondary | ICD-10-CM | POA: Diagnosis not present

## 2020-05-08 DIAGNOSIS — I4891 Unspecified atrial fibrillation: Secondary | ICD-10-CM | POA: Insufficient documentation

## 2020-05-08 DIAGNOSIS — Z803 Family history of malignant neoplasm of breast: Secondary | ICD-10-CM | POA: Insufficient documentation

## 2020-05-08 DIAGNOSIS — G4730 Sleep apnea, unspecified: Secondary | ICD-10-CM | POA: Insufficient documentation

## 2020-05-08 DIAGNOSIS — Z7901 Long term (current) use of anticoagulants: Secondary | ICD-10-CM | POA: Insufficient documentation

## 2020-05-08 DIAGNOSIS — Z8249 Family history of ischemic heart disease and other diseases of the circulatory system: Secondary | ICD-10-CM | POA: Diagnosis not present

## 2020-05-08 DIAGNOSIS — R232 Flushing: Secondary | ICD-10-CM | POA: Insufficient documentation

## 2020-05-08 NOTE — Assessment & Plan Note (Signed)
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 On Eligard [urology/ q36m;last 03/27/2020] + Zytiga+prednsione. AUG  2021-0.34 trending down; STABLE.   # continue Zytiga+prednisone; Labs today reviewed;   # A. 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   # HOt flashes-grade 1- STABLE.   # DISPOSITION: # follow up in 2 months-MD; YWSB-9-7 day prior- cbc/cmp/PSA--Dr.B

## 2020-05-08 NOTE — Progress Notes (Signed)
[name] NOTE  Patient Care Team: Juline Patch, MD as PCP - General (Family Medicine) Tat, Eustace Quail, DO as Consulting Physician (Neurology) Cammie Sickle, MD as Consulting Physician (Hematology and Oncology)  CHIEF COMPLAINTS/PURPOSE OF CONSULTATION: PROSTATE CANCER  #  Oncology History Overview Note  # PROSTATE CANCER- METATSTATIC to BONE; PSA- 26.5. Sclerotic 1.5 cm left ischial lesion/ Sclerotic medial left iliac bone 1.5 cm lesion (series 2/image 47), increased from 1.0 cm. Gleason score of 4+5= 9; with almost all cores involved greater than 80%.  9/16 Lupron 54-month depot on 9/16. [Urology; Dr.Siniski]  # MID OCT 2020- Zytiga 1000 mg+ prednisone  # Parkinsons's syndrome [Dr.Tat; [place]; neurologist]; CKD-III [creat1.3-1.5]  # GC- referred  # DECLINES- Palliative care [316/2021]  DIAGNOSIS: Prostate cancer  STAGE:     4    ;  GOALS: Palliative/control  CURRENT/MOST RECENT THERAPY : Lupron+ Zytiga      Prostate cancer metastatic to bone ([place])  05/24/2019 Initial Diagnosis   Prostate cancer metastatic to bone ([facility])    HISTORY OF PRESENTING ILLNESS:  [name] [name] 79 y.o.  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  Patient denies any worsening palpitations or hospitalization for his A. fib.  He continues to be in anticoagulation.  No falls.  Denies any swelling in the legs.  Denies any worsening fatigue.  No nausea vomiting.     Review of Systems  Constitutional: Negative for chills, diaphoresis, fever, malaise/fatigue and weight loss.  HENT: Negative for nosebleeds and sore throat.   Eyes: Negative for double vision.  Respiratory: Negative for cough, hemoptysis, sputum production, shortness of breath and wheezing.   Cardiovascular: Negative for chest pain, palpitations, orthopnea and leg swelling.  Gastrointestinal: Positive  for constipation. Negative for abdominal pain, blood in stool, diarrhea, heartburn, melena, nausea and vomiting.  Genitourinary: Positive for frequency and urgency. Negative for dysuria.  Musculoskeletal: Negative for back pain and joint pain.  Skin: Negative.  Negative for itching and rash.  Neurological: Positive for tremors. Negative for dizziness, tingling, focal weakness, weakness and headaches.  Endo/Heme/Allergies: Does not bruise/bleed easily.  Psychiatric/Behavioral: Negative for depression. The patient is not nervous/anxious and does not have insomnia.     MEDICAL HISTORY:  Past Medical History:  Diagnosis Date  . Parkinson's disease ([place])   . REM sleep behavior disorder   . Sleep apnea     SURGICAL HISTORY: Past Surgical History:  Procedure Laterality Date  . APPENDECTOMY    . 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 Marital status: Married    Spouse name: Not on file  . Number of children: Not on file  . Years of education: Not on file  . Highest education level: Not on file  Occupational History  . Occupation: retired    Comment: Nutritional therapist  Tobacco Use  . Smoking status: Former Smoker    Types: Cigarettes, Cigars    Quit date: 09/07/2010    Years since quitting: 9.7  . Smokeless tobacco: Never Used  Vaping Use  . Vaping Use: Never used  Substance and Sexual Activity  . Alcohol use: Yes    Comment: 1 can beer/day  . Drug use: Never  . Sexual activity: Not Currently  Other Topics Concern  . 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    Social Determinants of Health   Financial Resource Strain:   . Difficulty of Paying Living Expenses: Not on file  Food Insecurity:   . Worried About Charity fundraiser in the Last Year: Not on file  . Ran Out of Food in the Last Year: Not on file   Transportation Needs:   . Lack of Transportation (Medical): Not on file  . Lack of Transportation (Non-Medical): Not on file  Physical Activity:   . Days of Exercise per Week: Not on file  . Minutes of Exercise per Session: Not on file  Stress:   . Feeling of Stress : Not on file  Social Connections:   . Frequency of Communication with Friends and Family: Not on file  . Frequency of Social Gatherings with Friends and Family: Not on file  . Attends Religious Services: Not on file  . Active Member of Clubs or Organizations: Not on file  . Attends Archivist Meetings: Not on file  . Marital Status: Not on file  Intimate Partner Violence:   . Fear of Current or Ex-Partner: Not on file  . Emotionally Abused: Not on file  . Physically Abused: Not on file  . Sexually Abused: Not on file    FAMILY HISTORY: Family History  Problem Relation Age of Onset  . Heart disease Maternal Grandfather   . Diabetes Paternal Grandfather   . Breast cancer Daughter     ALLERGIES:  is allergic to influenza vaccines.  MEDICATIONS:  Current Outpatient Medications  Medication Sig Dispense Refill  . abiraterone acetate (ZYTIGA) 250 MG tablet TAKE 4 TABLETS (1,000 MG TOTAL) BY MOUTH DAILY. TAKE ON AN EMPTY STOMACH 1 HOUR BEFORE OR 2 HOURS AFTER A MEAL 120 tablet 4  . apixaban (ELIQUIS) 5 MG TABS tablet Take 1 tablet (5 mg total) by mouth 2 (two) times daily. 60 tablet 1  . carbidopa-levodopa (SINEMET IR) 25-100 MG tablet Take 2 tablets by mouth as directed. TAKE 2 tablets at 7AM, 2 tablet at 11AM, 2 tablets at 3PM, 1 tablet at 7PM    . clonazePAM (KLONOPIN) 0.5 MG tablet Take 0.5 mg by mouth at bedtime. Dr Lisabeth Devoid    . diltiazem (CARDIZEM CD) 120 MG 24 hr capsule Take 1 capsule (120 mg total) by mouth daily. 30 capsule 2  . Glycerin-Hypromellose-PEG 400 (VISINE DRY EYE OP) Apply to eye. otc    . ketoconazole (NIZORAL) 2 % shampoo Apply 1 application topically 2 (two) times a week.     Marland Kitchen  Opicapone 50 MG CAPS Take 50 mg by mouth daily. 90 capsule 2  . valACYclovir (VALTREX) 1000 MG tablet Take 1 tablet (1,000 mg total) by mouth 2 (two) times daily. 14 tablet 0  . tamsulosin (FLOMAX) 0.4 MG CAPS capsule Take 1 capsule (0.4 mg total) by mouth daily. 90 capsule 1   No current facility-administered medications for this visit.      Marland Kitchen  PHYSICAL EXAMINATION: ECOG PERFORMANCE STATUS: 0 - Asymptomatic  Vitals:   05/08/20 1104  BP: 120/75  Pulse: (!) 53  Resp: 16  Temp: 98.8 F (37.1 C)  SpO2: 98%   Filed Weights   05/08/20 1104  Weight: 168 lb (76.2 kg)    Physical Exam HENT:     Head: Normocephalic and atraumatic.     Mouth/Throat:     Pharynx: No oropharyngeal exudate.  Eyes:     Pupils: Pupils are equal, round, and reactive  to light.  Cardiovascular:     Rate and Rhythm: Normal rate and regular rhythm.  Pulmonary:     Effort: No respiratory distress.     Breath sounds: No wheezing.  Abdominal:     General: Bowel sounds are normal. There is no distension.     Palpations: Abdomen is soft. There is no mass.     Tenderness: There is no abdominal tenderness. There is no guarding or rebound.  Musculoskeletal:        General: No tenderness. Normal range of motion.     Cervical back: Normal range of motion and neck supple.  Skin:    General: Skin is warm.  Neurological:     Mental Status: He is alert and oriented to person, place, and time.     Comments: Tremor of right upper extremity.  Psychiatric:        Mood and Affect: Affect normal.      LABORATORY DATA:  I have reviewed the data as listed Lab Results  Component Value Date   WBC 10.0 05/06/2020   HGB 13.5 05/06/2020   HCT 39.9 05/06/2020   MCV 94.1 05/06/2020   PLT 221 05/06/2020   Recent Labs    02/12/20 1053 03/25/20 1048 05/06/20 0935  NA 138 138 142  K 4.1 3.6 3.7  CL 102 104 108  CO2 27 25 25   GLUCOSE 132* 128* 108*  BUN 25* 21 21  CREATININE 1.77* 1.46* 1.60*  CALCIUM 9.7  9.5 9.5  GFRNONAA 36* 45* 41*  GFRAA 42* 53* 47*  PROT 6.9 6.5 6.7  ALBUMIN 4.0 3.8 3.9  AST 42* 28 31  ALT 6 8 6   ALKPHOS 63 61 53  BILITOT 1.6* 1.8* 2.1*    RADIOGRAPHIC STUDIES: I have personally reviewed the radiological images as listed and agreed with the findings in the report. NM Bone Scan Whole Body  Result Date: 05/07/2020 CLINICAL DATA:  Prostate carcinoma. EXAM: NUCLEAR MEDICINE WHOLE BODY BONE SCAN TECHNIQUE: Whole body anterior and posterior images were obtained approximately 3 hours after intravenous injection of radiopharmaceutical. RADIOPHARMACEUTICALS:  20.38 mCi Technetium-22m MDP IV COMPARISON:  May 16, 2019.  April 15, 2020.  May 16, 2019. FINDINGS: Grossly stable appearance of foci of abnormal uptake involving the left ilium and left ischial tuberosity. Increased focus of abnormal uptake is seen to the right of the L5-S1 disc space which may represent degenerative change, but metastatic disease cannot be excluded. No other areas of abnormal uptake are noted. IMPRESSION: Stable abnormal foci seen involving the left ilium and left ischial tuberosity consistent with metastatic disease as noted on prior exam. Increased focus of abnormal uptake is seen to the right of the L5-S1 disc space which may represent degenerative change, but metastatic disease cannot be excluded. Electronically Signed   By: Marijo Conception M.D.   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 On Eligard [urology/ q10m;last 03/27/2020] + Zytiga+prednsione. AUG  2021-0.34 trending down; STABLE.   # continue Zytiga+prednisone; Labs today reviewed;   # A. 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   # HOt flashes-grade 1- STABLE.   # DISPOSITION: # follow up in 2 months-MD; JYNW-2-9 day prior- cbc/cmp/PSA--Dr.B   All questions  were answered. The patient knows to call the clinic with any problems, questions or concerns.    Cammie Sickle, MD 05/26/2020 6:55 PM

## 2020-05-15 MED FILL — predniSONE 10 MG TABS: 10 | 30 days supply | Qty: 30 | Fill #4

## 2020-05-15 MED FILL — ABIRATERONE ACETATE 250 MG: 250 | 30 days supply | Qty: 120 | Fill #1

## 2020-05-20 ENCOUNTER — Other Ambulatory Visit: Payer: Medicare Other

## 2020-05-22 DIAGNOSIS — N400 Enlarged prostate without lower urinary tract symptoms: Secondary | ICD-10-CM

## 2020-05-22 MED ORDER — TAMSULOSIN HCL 0.4 MG PO CAPS: 0.4000 mg | ORAL_CAPSULE | Freq: Every day | ORAL | 1 refills | Status: DC

## 2020-06-17 MED FILL — predniSONE 10 MG TABS: 10 | 30 days supply | Qty: 30 | Fill #5

## 2020-06-17 MED FILL — ABIRATERONE ACETATE 250 MG: 250 | 30 days supply | Qty: 120 | Fill #2

## 2020-06-18 DIAGNOSIS — M5416 Radiculopathy, lumbar region: Secondary | ICD-10-CM | POA: Diagnosis not present

## 2020-06-18 DIAGNOSIS — M5489 Other dorsalgia: Secondary | ICD-10-CM | POA: Diagnosis not present

## 2020-06-18 DIAGNOSIS — G8929 Other chronic pain: Secondary | ICD-10-CM | POA: Diagnosis not present

## 2020-06-18 DIAGNOSIS — M5441 Lumbago with sciatica, right side: Secondary | ICD-10-CM | POA: Diagnosis not present

## 2020-06-18 DIAGNOSIS — M5126 Other intervertebral disc displacement, lumbar region: Secondary | ICD-10-CM | POA: Diagnosis not present

## 2020-06-18 DIAGNOSIS — M5136 Other intervertebral disc degeneration, lumbar region: Secondary | ICD-10-CM | POA: Diagnosis not present

## 2020-06-24 NOTE — Progress Notes (Deleted)
Assessment/Plan:   1.  Parkinsons Disease  -***Continue carbidopa/levodopa 25/100, 2 tablets at 7 AM/11 AM/3 PM/1 tablet at 7 PM   2.  RBD  -Diazepam tablet 0.5 mg at bed  3.  osas  -On BiPAP.  -Follows with [facility] neurology/sleep medicine  4.  Metastatic prostate cancer with bony mets  -Following with oncology  5.  Atrial flutter  -On Eliquis   Subjective:   [name] [name] was seen today in follow up for Parkinsons disease.  My previous records were reviewed prior to todays visit as well as outside records available to me. Pt denies falls.  Pt denies lightheadedness, near syncope.  No hallucinations.  Mood has been good.  Was last seen by [facility] neurology for his sleep apnea in August.  This was a telemedicine visit.  We started him on opicapone last visit.  He initially thought that the medication helped, but later on decided that it did not help, and ultimately decided not to fill the prescription (only took the sample bottles).  Current prescribed movement disorder medications: Carbidopa/levodopa 25/100, 2 tablets at 7 AM/2 tablet at 11 AM/2 tablets at 3 PM/1 tablet at 7 PM  Clonazepam 0.5 mg, 1 tablet at bedtime Opicapone, 50 mg daily (started last visit)  PREVIOUS MEDICATIONS: azilect,carbidopa/levodopa 25/100; klonopin; opicapone (took sample bottles and did not think it was that helpful and I decided not to fill prescription)  ALLERGIES:   Allergies  Allergen Reactions  . Influenza Vaccines Anaphylaxis    Flu shot: 1974 anaphylaxis. 2nd time swollen arm Flu shot: 1974 anaphylaxis. 2nd time swollen arm     CURRENT MEDICATIONS:  Outpatient Encounter Medications as of 06/27/2020  Medication Sig  . abiraterone acetate (ZYTIGA) 250 MG tablet TAKE 4 TABLETS (1,000 MG TOTAL) BY MOUTH DAILY. TAKE ON AN EMPTY STOMACH 1 HOUR BEFORE OR 2 HOURS AFTER A MEAL  . apixaban (ELIQUIS) 5 MG TABS tablet Take 1 tablet (5 mg total) by mouth 2 (two) times daily.  .  carbidopa-levodopa (SINEMET IR) 25-100 MG tablet Take 2 tablets by mouth as directed. TAKE 2 tablets at 7AM, 2 tablet at 11AM, 2 tablets at 3PM, 1 tablet at 7PM  . clonazePAM (KLONOPIN) 0.5 MG tablet Take 0.5 mg by mouth at bedtime. Dr Lisabeth Devoid  . diltiazem (CARDIZEM CD) 120 MG 24 hr capsule Take 1 capsule (120 mg total) by mouth daily.  . Glycerin-Hypromellose-PEG 400 (VISINE DRY EYE OP) Apply to eye. otc  . ketoconazole (NIZORAL) 2 % shampoo Apply 1 application topically 2 (two) times a week.   Marland Kitchen Opicapone 50 MG CAPS Take 50 mg by mouth daily.  . tamsulosin (FLOMAX) 0.4 MG CAPS capsule Take 1 capsule (0.4 mg total) by mouth daily.  . valACYclovir (VALTREX) 1000 MG tablet Take 1 tablet (1,000 mg total) by mouth 2 (two) times daily.   No facility-administered encounter medications on file as of 06/27/2020.    Objective:   PHYSICAL EXAMINATION:    VITALS:  There were no vitals filed for this visit.  GEN:  The patient appears stated age and is in NAD. HEENT:  Normocephalic, atraumatic.  The mucous membranes are moist. The superficial temporal arteries are without ropiness or tenderness. CV:  RRR Lungs:  CTAB Neck/HEME:  There are no carotid bruits bilaterally.  Neurological examination:  Orientation: The patient is alert and oriented x3. Cranial nerves: There is good facial symmetry with*** facial hypomimia. The speech is fluent and clear. Soft palate rises symmetrically and there is  no tongue deviation. Hearing is intact to conversational tone. Sensation: Sensation is intact to light touch throughout Motor: Strength is at least antigravity x4.  Movement examination: Tone: There is ***tone in the *** Abnormal movements: *** Coordination:  There is *** decremation with RAM's, *** Gait and Station: The patient has *** difficulty arising out of a deep-seated chair without the use of the hands. The patient's stride length is ***.  The patient has a *** pull test.     I have reviewed and  interpreted the following labs independently    Chemistry      Component Value Date/Time   NA 142 05/06/2020 0935   K 3.7 05/06/2020 0935   CL 108 05/06/2020 0935   CO2 25 05/06/2020 0935   BUN 21 05/06/2020 0935   CREATININE 1.60 (H) 05/06/2020 0935      Component Value Date/Time   CALCIUM 9.5 05/06/2020 0935   ALKPHOS 53 05/06/2020 0935   AST 31 05/06/2020 0935   ALT 6 05/06/2020 0935   BILITOT 2.1 (H) 05/06/2020 0935       Lab Results  Component Value Date   WBC 10.0 05/06/2020   HGB 13.5 05/06/2020   HCT 39.9 05/06/2020   MCV 94.1 05/06/2020   PLT 221 05/06/2020    Lab Results  Component Value Date   TSH 1.217 08/16/2019     Total time spent on today's visit was ***30 minutes, including both face-to-face time and nonface-to-face time.  Time included that spent on review of records (prior notes available to me/labs/imaging if pertinent), discussing treatment and goals, answering patient's questions and coordinating care.  Cc:  Juline Patch, MD

## 2020-06-26 DIAGNOSIS — M5416 Radiculopathy, lumbar region: Secondary | ICD-10-CM | POA: Diagnosis not present

## 2020-06-27 ENCOUNTER — Ambulatory Visit: Payer: Medicare Other | Admitting: Neurology

## 2020-07-01 DIAGNOSIS — M5416 Radiculopathy, lumbar region: Secondary | ICD-10-CM | POA: Diagnosis not present

## 2020-07-01 DIAGNOSIS — G8929 Other chronic pain: Secondary | ICD-10-CM | POA: Diagnosis not present

## 2020-07-01 DIAGNOSIS — M5136 Other intervertebral disc degeneration, lumbar region: Secondary | ICD-10-CM | POA: Diagnosis not present

## 2020-07-01 DIAGNOSIS — M5126 Other intervertebral disc displacement, lumbar region: Secondary | ICD-10-CM | POA: Diagnosis not present

## 2020-07-01 DIAGNOSIS — M5441 Lumbago with sciatica, right side: Secondary | ICD-10-CM | POA: Diagnosis not present

## 2020-07-01 DIAGNOSIS — M5489 Other dorsalgia: Secondary | ICD-10-CM | POA: Diagnosis not present

## 2020-07-02 ENCOUNTER — Telehealth: Payer: Self-pay | Admitting: Family Medicine

## 2020-07-02 NOTE — Telephone Encounter (Signed)
Left message for patient to call back and schedule Medicare Annual Wellness Visit (AWV) either virtually/audio only or in office. Whichever the patients preference is.  No history of AWV; please schedule at anytime with [facility] Health Advisor.  This should be a 40 minute visit

## 2020-07-04 ENCOUNTER — Other Ambulatory Visit: Payer: Self-pay | Admitting: Physical Medicine and Rehabilitation

## 2020-07-04 ENCOUNTER — Other Ambulatory Visit (HOSPITAL_COMMUNITY): Payer: Self-pay | Admitting: Physical Medicine and Rehabilitation

## 2020-07-04 DIAGNOSIS — M545 Low back pain, unspecified: Secondary | ICD-10-CM

## 2020-07-05 ENCOUNTER — Other Ambulatory Visit: Payer: Self-pay

## 2020-07-05 ENCOUNTER — Inpatient Hospital Stay: Payer: Medicare Other | Attending: Internal Medicine

## 2020-07-05 DIAGNOSIS — C7951 Secondary malignant neoplasm of bone: Secondary | ICD-10-CM | POA: Insufficient documentation

## 2020-07-05 DIAGNOSIS — C61 Malignant neoplasm of prostate: Secondary | ICD-10-CM | POA: Insufficient documentation

## 2020-07-05 LAB — CBC WITH DIFFERENTIAL/PLATELET
Abs Immature Granulocytes: 0.03 10*3/uL (ref 0.00–0.07)
Basophils Absolute: 0 10*3/uL (ref 0.0–0.1)
Basophils Relative: 0 %
Eosinophils Absolute: 0.1 10*3/uL (ref 0.0–0.5)
Eosinophils Relative: 1 %
HCT: 40.8 % (ref 39.0–52.0)
Hemoglobin: 13.6 g/dL (ref 13.0–17.0)
Immature Granulocytes: 0 %
Lymphocytes Relative: 4 %
Lymphs Abs: 0.5 10*3/uL — ABNORMAL LOW (ref 0.7–4.0)
MCH: 31.9 pg (ref 26.0–34.0)
MCHC: 33.3 g/dL (ref 30.0–36.0)
MCV: 95.6 fL (ref 80.0–100.0)
Monocytes Absolute: 0.3 10*3/uL (ref 0.1–1.0)
Monocytes Relative: 3 %
Neutro Abs: 9.8 10*3/uL — ABNORMAL HIGH (ref 1.7–7.7)
Neutrophils Relative %: 92 %
Platelets: 263 10*3/uL (ref 150–400)
RBC: 4.27 MIL/uL (ref 4.22–5.81)
RDW: 12.9 % (ref 11.5–15.5)
WBC: 10.7 10*3/uL — ABNORMAL HIGH (ref 4.0–10.5)
nRBC: 0 % (ref 0.0–0.2)

## 2020-07-05 LAB — COMPREHENSIVE METABOLIC PANEL
ALT: 8 U/L (ref 0–44)
AST: 33 U/L (ref 15–41)
Albumin: 4 g/dL (ref 3.5–5.0)
Alkaline Phosphatase: 86 U/L (ref 38–126)
Anion gap: 8 (ref 5–15)
BUN: 21 mg/dL (ref 8–23)
CO2: 25 mmol/L (ref 22–32)
Calcium: 9.8 mg/dL (ref 8.9–10.3)
Chloride: 107 mmol/L (ref 98–111)
Creatinine, Ser: 1.31 mg/dL — ABNORMAL HIGH (ref 0.61–1.24)
GFR, Estimated: 56 mL/min — ABNORMAL LOW (ref >60)
Glucose, Bld: 124 mg/dL — ABNORMAL HIGH (ref 70–99)
Potassium: 3.9 mmol/L (ref 3.5–5.1)
Sodium: 140 mmol/L (ref 135–145)
Total Bilirubin: 2 mg/dL — ABNORMAL HIGH (ref 0.3–1.2)
Total Protein: 7.1 g/dL (ref 6.5–8.1)

## 2020-07-05 LAB — PSA: Prostatic Specific Antigen: 0.32 ng/mL (ref 0.00–4.00)

## 2020-07-06 ENCOUNTER — Ambulatory Visit
Admission: RE | Admit: 2020-07-06 | Discharge: 2020-07-06 | Disposition: A | Discharge status: Home / Self Care | Payer: Medicare Other | Source: Ambulatory Visit | Attending: Physical Medicine and Rehabilitation | Admitting: Physical Medicine and Rehabilitation

## 2020-07-06 DIAGNOSIS — M545 Low back pain, unspecified: Secondary | ICD-10-CM | POA: Insufficient documentation

## 2020-07-08 ENCOUNTER — Encounter: Payer: Self-pay | Admitting: Internal Medicine

## 2020-07-08 ENCOUNTER — Inpatient Hospital Stay: Payer: Medicare Other | Attending: Internal Medicine | Admitting: Internal Medicine

## 2020-07-08 ENCOUNTER — Other Ambulatory Visit: Payer: Self-pay

## 2020-07-08 VITALS — BP 111/76 | HR 61 | Temp 97.9°F | Resp 16 | Ht 68.5 in | Wt 153.6 lb

## 2020-07-08 DIAGNOSIS — Z8249 Family history of ischemic heart disease and other diseases of the circulatory system: Secondary | ICD-10-CM | POA: Diagnosis not present

## 2020-07-08 DIAGNOSIS — Z79899 Other long term (current) drug therapy: Secondary | ICD-10-CM | POA: Diagnosis not present

## 2020-07-08 DIAGNOSIS — M549 Dorsalgia, unspecified: Secondary | ICD-10-CM | POA: Insufficient documentation

## 2020-07-08 DIAGNOSIS — C61 Malignant neoplasm of prostate: Secondary | ICD-10-CM | POA: Diagnosis not present

## 2020-07-08 DIAGNOSIS — M5126 Other intervertebral disc displacement, lumbar region: Secondary | ICD-10-CM | POA: Insufficient documentation

## 2020-07-08 DIAGNOSIS — R232 Flushing: Secondary | ICD-10-CM | POA: Insufficient documentation

## 2020-07-08 DIAGNOSIS — Z7983 Long term (current) use of bisphosphonates: Secondary | ICD-10-CM | POA: Diagnosis not present

## 2020-07-08 DIAGNOSIS — Z9049 Acquired absence of other specified parts of digestive tract: Secondary | ICD-10-CM | POA: Insufficient documentation

## 2020-07-08 DIAGNOSIS — N183 Chronic kidney disease, stage 3 unspecified: Secondary | ICD-10-CM | POA: Diagnosis not present

## 2020-07-08 DIAGNOSIS — Z87891 Personal history of nicotine dependence: Secondary | ICD-10-CM | POA: Insufficient documentation

## 2020-07-08 DIAGNOSIS — Z803 Family history of malignant neoplasm of breast: Secondary | ICD-10-CM | POA: Diagnosis not present

## 2020-07-08 DIAGNOSIS — Z7901 Long term (current) use of anticoagulants: Secondary | ICD-10-CM | POA: Diagnosis not present

## 2020-07-08 DIAGNOSIS — M48061 Spinal stenosis, lumbar region without neurogenic claudication: Secondary | ICD-10-CM | POA: Diagnosis not present

## 2020-07-08 DIAGNOSIS — G20 Parkinson's disease: Secondary | ICD-10-CM | POA: Insufficient documentation

## 2020-07-08 DIAGNOSIS — M47819 Spondylosis without myelopathy or radiculopathy, site unspecified: Secondary | ICD-10-CM | POA: Insufficient documentation

## 2020-07-08 DIAGNOSIS — C7951 Secondary malignant neoplasm of bone: Secondary | ICD-10-CM

## 2020-07-08 DIAGNOSIS — M5136 Other intervertebral disc degeneration, lumbar region: Secondary | ICD-10-CM | POA: Diagnosis not present

## 2020-07-08 DIAGNOSIS — M5117 Intervertebral disc disorders with radiculopathy, lumbosacral region: Secondary | ICD-10-CM | POA: Insufficient documentation

## 2020-07-08 DIAGNOSIS — Z5181 Encounter for therapeutic drug level monitoring: Secondary | ICD-10-CM | POA: Diagnosis not present

## 2020-07-08 DIAGNOSIS — K5900 Constipation, unspecified: Secondary | ICD-10-CM | POA: Diagnosis not present

## 2020-07-08 DIAGNOSIS — Z833 Family history of diabetes mellitus: Secondary | ICD-10-CM | POA: Insufficient documentation

## 2020-07-08 DIAGNOSIS — M4856XA Collapsed vertebra, not elsewhere classified, lumbar region, initial encounter for fracture: Secondary | ICD-10-CM | POA: Diagnosis not present

## 2020-07-08 DIAGNOSIS — R5383 Other fatigue: Secondary | ICD-10-CM | POA: Insufficient documentation

## 2020-07-08 DIAGNOSIS — M8088XA Other osteoporosis with current pathological fracture, vertebra(e), initial encounter for fracture: Secondary | ICD-10-CM | POA: Insufficient documentation

## 2020-07-08 DIAGNOSIS — I4891 Unspecified atrial fibrillation: Secondary | ICD-10-CM | POA: Diagnosis not present

## 2020-07-08 NOTE — Progress Notes (Signed)
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 Sclerotic 1.5 cm left ischial lesion/ Sclerotic medial left iliac bone 1.5 cm lesion (series 2/image 47), increased from 1.0 cm. Gleason score of 4+5= 9; with almost all cores involved greater than 80%.  9/16 Lupron 92-month depot on 9/16. [Urology; Dr.Siniski]  # MID OCT 2020- Zytiga 1000 mg+ prednisone  # Parkinsons's syndrome [Dr.Tat; [place]; neurologist]; CKD-III [creat1.3-1.5]  # GC- referred  # DECLINES- Palliative care [316/2021]  DIAGNOSIS: Prostate cancer  STAGE:     4    ;  GOALS: Palliative/control  CURRENT/MOST RECENT THERAPY : Lupron+ Zytiga      Prostate cancer metastatic to bone ([facility])  05/24/2019 Initial Diagnosis   Prostate cancer metastatic to bone (H[facility])    HISTORY OF PRESENTING ILLNESS:  [name] [name] 79 y.o.  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  Patient states that he recently hurt his back while trying to get his wife out of the bathtub.  Patient had MRI that showed L4 compression fracture up to 40%.  Patient has received injections in his back.   Patient denies any swelling in the legs.  Denies any nausea vomiting.  He is on Eliquis.  Complains of moderate fatigue.  Review of Systems  Constitutional: Negative for chills, diaphoresis, fever, malaise/fatigue and weight loss.  HENT: Negative for nosebleeds and sore throat.   Eyes: Negative for double vision.  Respiratory: Negative for cough, hemoptysis, sputum production, shortness of breath and wheezing.    Cardiovascular: Negative for chest pain, palpitations, orthopnea and leg swelling.  Gastrointestinal: Positive for constipation. Negative for abdominal pain, blood in stool, diarrhea, heartburn, melena, nausea and vomiting.  Genitourinary: Positive for frequency and urgency. Negative for dysuria.  Musculoskeletal: Negative for back pain and joint pain.  Skin: Negative.  Negative for itching and rash.  Neurological: Positive for tremors. Negative for dizziness, tingling, focal weakness, weakness and headaches.  Endo/Heme/Allergies: Does not bruise/bleed easily.  Psychiatric/Behavioral: Negative for depression. The patient is not nervous/anxious and does not have insomnia.     MEDICAL HISTORY:  Past Medical History:  Diagnosis Date  . Parkinson's disease ([place])   . REM sleep behavior disorder   . Sleep apnea     SURGICAL HISTORY: Past Surgical History:  Procedure Laterality Date  . APPENDECTOMY    . 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 Marital status: Married    Spouse name: Not on file  . Number of children: Not on file  . Years of education: Not on file  . Highest education level: Not on file  Occupational History  . Occupation: retired    Comment: Nutritional therapist  Tobacco Use  . Smoking status: Former Smoker    Types: Cigarettes, Cigars    Quit date: 09/07/2010    Years since quitting: 9.8  . Smokeless tobacco: Never Used  Vaping Use  . Vaping Use: Never used  Substance and Sexual Activity  . Alcohol use: Yes    Comment: 1 can beer/day  . Drug use: Never  . Sexual activity: Not Currently  Other  Topics Concern  . 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    Social Determinants of Health   Financial Resource Strain:   . Difficulty of Paying Living Expenses: Not on file  Food Insecurity:   . Worried  About Charity fundraiser in the Last Year: Not on file  . Ran Out of Food in the Last Year: Not on file  Transportation Needs:   . Lack of Transportation (Medical): Not on file  . Lack of Transportation (Non-Medical): Not on file  Physical Activity:   . Days of Exercise per Week: Not on file  . Minutes of Exercise per Session: Not on file  Stress:   . Feeling of Stress : Not on file  Social Connections:   . Frequency of Communication with Friends and Family: Not on file  . Frequency of Social Gatherings with Friends and Family: Not on file  . Attends Religious Services: Not on file  . Active Member of Clubs or Organizations: Not on file  . Attends Archivist Meetings: Not on file  . Marital Status: Not on file  Intimate Partner Violence:   . Fear of Current or Ex-Partner: Not on file  . Emotionally Abused: Not on file  . Physically Abused: Not on file  . Sexually Abused: Not on file    FAMILY HISTORY: Family History  Problem Relation Age of Onset  . Heart disease Maternal Grandfather   . Diabetes Paternal Grandfather   . Breast cancer Daughter     ALLERGIES:  is allergic to influenza vaccines.  MEDICATIONS:  Current Outpatient Medications  Medication Sig Dispense Refill  . abiraterone acetate (ZYTIGA) 250 MG tablet TAKE 4 TABLETS (1,000 MG TOTAL) BY MOUTH DAILY. TAKE ON AN EMPTY STOMACH 1 HOUR BEFORE OR 2 HOURS AFTER A MEAL 120 tablet 4  . apixaban (ELIQUIS) 5 MG TABS tablet Take 1 tablet (5 mg total) by mouth 2 (two) times daily. 60 tablet 1  . carbidopa-levodopa (SINEMET IR) 25-100 MG tablet Take 2 tablets by mouth as directed. TAKE 2 tablets at 7AM, 2 tablet at 11AM, 2 tablets at 3PM, 1 tablet at 7PM    . clonazePAM (KLONOPIN) 0.5 MG tablet Take 0.5 mg by mouth at bedtime. Dr Lisabeth Devoid    . diltiazem (CARDIZEM CD) 120 MG 24 hr capsule Take 1 capsule (120 mg total) by mouth daily. 30 capsule 2  . Glycerin-Hypromellose-PEG 400 (VISINE DRY EYE OP) Apply to eye. otc     . ketoconazole (NIZORAL) 2 % shampoo Apply 1 application topically 2 (two) times a week.     . methocarbamol (ROBAXIN) 500 MG tablet Take 250-500 mg by mouth at bedtime as needed.    . predniSONE (DELTASONE) 10 MG tablet Take by mouth.    . tamsulosin (FLOMAX) 0.4 MG CAPS capsule Take 1 capsule (0.4 mg total) by mouth daily. 90 capsule 1  . traMADol (ULTRAM) 50 MG tablet Take by mouth.     No current facility-administered medications for this visit.      Marland Kitchen  PHYSICAL EXAMINATION: ECOG PERFORMANCE STATUS: 0 - Asymptomatic  Vitals:   07/08/20 1036  BP: 111/76  Pulse: 61  Resp: 16  Temp: 97.9 F (36.6 C)  SpO2: 97%   Filed Weights   07/08/20 1036  Weight: 153 lb 9.6 oz (69.7 kg)    Physical Exam HENT:     Head: Normocephalic  and atraumatic.     Mouth/Throat:     Pharynx: No oropharyngeal exudate.  Eyes:     Pupils: Pupils are equal, round, and reactive to light.  Cardiovascular:     Rate and Rhythm: Normal rate and regular rhythm.  Pulmonary:     Effort: No respiratory distress.     Breath sounds: No wheezing.  Abdominal:     General: Bowel sounds are normal. There is no distension.     Palpations: Abdomen is soft. There is no mass.     Tenderness: There is no abdominal tenderness. There is no guarding or rebound.  Musculoskeletal:        General: No tenderness. Normal range of motion.     Cervical back: Normal range of motion and neck supple.  Skin:    General: Skin is warm.  Neurological:     Mental Status: He is alert and oriented to person, place, and time.     Comments: Tremor of right upper extremity.  Psychiatric:        Mood and Affect: Affect normal.      LABORATORY DATA:  I have reviewed the data as listed Lab Results  Component Value Date   WBC 10.7 (H) 07/05/2020   HGB 13.6 07/05/2020   HCT 40.8 07/05/2020   MCV 95.6 07/05/2020   PLT 263 07/05/2020   Recent Labs    02/12/20 1053 02/12/20 1053 03/25/20 1048 05/06/20 0935  07/05/20 1132  NA 138   < > 138 142 140  K 4.1   < > 3.6 3.7 3.9  CL 102   < > 104 108 107  CO2 27   < > 25 25 25   GLUCOSE 132*   < > 128* 108* 124*  BUN 25*   < > 21 21 21   CREATININE 1.77*   < > 1.46* 1.60* 1.31*  CALCIUM 9.7   < > 9.5 9.5 9.8  GFRNONAA 36*   < > 45* 41* 56*  GFRAA 42*  --  53* 47*  --   PROT 6.9   < > 6.5 6.7 7.1  ALBUMIN 4.0   < > 3.8 3.9 4.0  AST 42*   < > 28 31 33  ALT 6   < > 8 6 8   ALKPHOS 63   < > 61 53 86  BILITOT 1.6*   < > 1.8* 2.1* 2.0*   < > = values in this interval not displayed.    RADIOGRAPHIC STUDIES: I have personally reviewed the radiological images as listed and agreed with the findings in the report. MR LUMBAR SPINE WO CONTRAST  Result Date: 07/06/2020 CLINICAL DATA:  Back pain. EXAM: MRI LUMBAR SPINE WITHOUT CONTRAST TECHNIQUE: Multiplanar, multisequence MR imaging of the lumbar spine was performed. No intravenous contrast was administered. COMPARISON:  04/15/2020 FINDINGS: Segmentation: There are five lumbar type vertebral bodies. The last full intervertebral disc space is labeled L5-S1. Alignment:  Normal Vertebrae: Acute inferior endplate compression fracture of L2 with approximately 40% compression. No significant retropulsion or canal compromise. The other lower thoracic and lumbar vertebral bodies are intact. Conus medullaris and cauda equina: Conus extends to the L1 level. Conus and cauda equina appear normal. Paraspinal and other soft tissues: Mild paraspinal soft tissue swelling/hematoma at L2. Disc levels: T12-L1: No significant findings. L1-2: No significant findings. L2-3: Mild retropulsion of L2 inferiorly with mild canal encroachment and mild bilateral lateral recess stenosis. No significant spinal stenosis or foraminal stenosis. L3-4: Bulging annulus and mild facet  disease. Slight flattening of the ventral thecal sac. There is a very small focal left paracentral disc protrusion potentially irritating the left L4 nerve root in the  lateral recess. L4-5: Advanced degenerative disc disease and moderate facet disease. Bulging annulus and osteophytic ridging and ligamentum flavum thickening contributing to mild spinal stenosis and moderate bilateral lateral recess stenosis. No foraminal stenosis. L5-S1: Disc disease and facet disease and a shallow right paracentral disc protrusion. Potential irritation of the right S1 nerve root. No significant foraminal stenosis. IMPRESSION: 1. Acute inferior endplate compression fracture of L2 with approximately 40% compression. Mild retropulsion but no significant canal compromise. 2. Very small focal left paracentral disc protrusion at L3-4 potentially irritating the left L4 nerve root in the lateral recess. 3. Mild spinal stenosis and moderate bilateral lateral recess stenosis at L4-5. 4. Shallow right paracentral disc protrusion at L5-S1. Potential irritation of the right S1 nerve root. Electronically Signed   By: Marijo Sanes M.D.   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 On Eligard [urology/ q61m;last 03/27/2020] + Zytiga+prednsione. AUG  2021-0.32 trending down; STABLE.   # continue Zytiga+prednisone; Labs today reviewed;   # A. Fib-Eliquis--STABLE  #CKD stage III- GFR-54 STABLE.   # HOt flashes-grade 1- STABLE.   #L4 compression fracture/back pain- s/p no significant trauma.  High risk for osteoporosis.  Check bone density test.  Discussed regarding use of bisphosphonates.  Discussed the potential side effects including but not limited to hypocalcemia and also osteonecrosis of jaw.  Recommend calcium plus vitamin D.  Await bone density test.  # DISPOSITION: # BMD in 1-2 weeks.  # follow up in 1 months-MD; Labs-1-2 day prior- cbc/cmp/PSA; RE[facility] -Dr.B   All questions were answered. The patient knows to call the clinic with any  problems, questions or concerns.    Cammie Sickle, MD 07/08/2020 2:16 PM

## 2020-07-08 NOTE — Assessment & Plan Note (Addendum)
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 On Eligard [urology/ q67m;last 03/27/2020] + Zytiga+prednsione. AUG  2021-0.32 trending down; STABLE.   # continue Zytiga+prednisone; Labs today reviewed;   # A. Fib-Eliquis--STABLE  #CKD stage III- GFR-54 STABLE.   # HOt flashes-grade 1- STABLE.   #L4 compression fracture/back pain- s/p no significant trauma.  High risk for osteoporosis.  Check bone density test.  Discussed regarding use of bisphosphonates.  Discussed the potential side effects including but not limited to hypocalcemia and also osteonecrosis of jaw.  Recommend calcium plus vitamin D.  Await bone density test.  # DISPOSITION: # BMD in 1-2 weeks.  # follow up in 1 months-MD; Labs-1-2 day prior- cbc/cmp/PSA; [facility] -Dr.B

## 2020-07-09 ENCOUNTER — Ambulatory Visit: Payer: Medicare Other | Admitting: Neurology

## 2020-07-09 ENCOUNTER — Telehealth: Payer: Self-pay | Admitting: *Deleted

## 2020-07-09 NOTE — Telephone Encounter (Signed)
Contacted patient. Patient made aware that his son's FMLA papers have been completed. Patient may pick up the completed documentation at the front reception area. Pt gave verbal understanding of the plan of care.

## 2020-07-10 DIAGNOSIS — H04123 Dry eye syndrome of bilateral lacrimal glands: Secondary | ICD-10-CM | POA: Diagnosis not present

## 2020-07-10 DIAGNOSIS — H2513 Age-related nuclear cataract, bilateral: Secondary | ICD-10-CM | POA: Diagnosis not present

## 2020-07-10 DIAGNOSIS — H21233 Degeneration of iris (pigmentary), bilateral: Secondary | ICD-10-CM | POA: Diagnosis not present

## 2020-07-10 DIAGNOSIS — H43813 Vitreous degeneration, bilateral: Secondary | ICD-10-CM | POA: Diagnosis not present

## 2020-07-12 ENCOUNTER — Other Ambulatory Visit: Payer: Self-pay

## 2020-07-12 ENCOUNTER — Other Ambulatory Visit
Admission: RE | Admit: 2020-07-12 | Discharge: 2020-07-12 | Disposition: A | Discharge status: Home / Self Care | Payer: Medicare Other | Source: Ambulatory Visit | Attending: Orthopedic Surgery | Admitting: Orthopedic Surgery

## 2020-07-12 ENCOUNTER — Other Ambulatory Visit: Payer: Self-pay | Admitting: Orthopedic Surgery

## 2020-07-12 DIAGNOSIS — S32010A Wedge compression fracture of first lumbar vertebra, initial encounter for closed fracture: Secondary | ICD-10-CM | POA: Diagnosis not present

## 2020-07-12 DIAGNOSIS — Z01812 Encounter for preprocedural laboratory examination: Secondary | ICD-10-CM | POA: Insufficient documentation

## 2020-07-12 DIAGNOSIS — Z20822 Contact with and (suspected) exposure to covid-19: Secondary | ICD-10-CM | POA: Insufficient documentation

## 2020-07-12 DIAGNOSIS — I700 Atherosclerosis of aorta: Secondary | ICD-10-CM | POA: Diagnosis not present

## 2020-07-13 LAB — SARS CORONAVIRUS 2 (TAT 6-24 HRS): SARS Coronavirus 2: NEGATIVE

## 2020-07-15 ENCOUNTER — Other Ambulatory Visit: Payer: Self-pay

## 2020-07-15 ENCOUNTER — Encounter
Admission: RE | Admit: 2020-07-15 | Discharge: 2020-07-15 | Disposition: A | Discharge status: Home / Self Care | Payer: Medicare Other | Source: Ambulatory Visit | Attending: Orthopedic Surgery | Admitting: Orthopedic Surgery

## 2020-07-15 HISTORY — DX: Unspecified atrial flutter: I48.92

## 2020-07-15 HISTORY — DX: Malignant (primary) neoplasm, unspecified: C80.1

## 2020-07-15 NOTE — Pre-Procedure Instructions (Signed)
Progress Notes - documented in this encounter [name] [name], [employer] - 04/30/2020 2:00 PM EDT Formatting of this note is different from the original. Established Patient Visit   Chief Complaint: No chief complaint on file. Date of Service: 04/30/2020 Date of Birth: 1941/08/22 PCP: Armando Reichert, MD  History of Present Illness: [name] [name] is a 79 y.o.male patient with a history of paroxysmal atrial flutter, Parkinson's disease, prostate cancer, and OSA on CPAP. The patient was seen on 08/16/2019 with 10-day history of intermittent episodes of elevated heart rate. ECG revealed narrow complex tachycardia rate of 130 bpm. The patient was sent to [facility] ED where he was treated with Cardizem bolus and drip. ECG revealed atrial flutter; he then converted to sinus rhythm. The patient was maintained on Cardizem CD 120 mg daily, and started on Eliquis 5 mg twice daily for stroke prevention. 2D echocardiogram 08/17/2019 revealed normal left ventricular function, with LVEF of 50 to 55%.   The patient returns today for a 10-month follow-up and reports doing well from a cardiovascular perspective with no complaints. He denies chest pain or shortness of breath. He denies peripheral edema. He denies lightheadedness, dizziness, presyncope, or syncope. He reports occasional palpitations that are brief in nature. He checks his blood pressure and heart rate regularly, and reports that systolic blood pressure is typically 100-110. His blood pressure today is low at 92/70, but appears asymptomatic. He states that his heart rate and blood pressure have always been low with heart rates in the 50s. His heart rate today is 67 bpm. He remains on Cardizem CD 120 mg daily. He is on Eliquis 5 mg twice daily, which is well-tolerated without apparent bleeding side effects. Labs on 03/25/2020 reveal stable hemoglobin 13.4 and hematocrit 39.7.  Past Medical and Surgical History  Past Medical History Past Medical History:   Diagnosis Date  . BPH (benign prostatic hyperplasia)  . Diverticulosis 06/05/14  . Parkinson disease (CMS-HCC) 2015  . RBD (REM behavioral disorder) 2012  . Sleep apnea  BiPap   Past Surgical History He has a past surgical history that includes colonoscopy (10/04/2008); Appendectomy (1963); and Colonoscopy (06/05/14).   Medications and Allergies  Current Medications  Current Outpatient Medications  Medication Sig Dispense Refill  . abiraterone (ZYTIGA) 250 mg tablet Take 1,000 mg by mouth once daily  . carbidopa-levodopa (SINEMET) 25-100 mg tablet TAKE 2 TABLETS THREE TIMES A DAY AROUND MEAL TIMES 540 tablet 0  . clonazePAM (KLONOPIN) 0.5 MG tablet Take 1 tablet (0.5 mg total) by mouth nightly for 180 days 90 tablet 1  . diltiazem (CARDIZEM CD) 120 MG XR capsule Take 1 capsule (120 mg total) by mouth once daily 90 capsule 3  . ELIQUIS 5 mg tablet TAKE 1 TABLET TWICE A DAY 180 tablet 3  . ketoconazole (NIZORAL) 2 % shampoo Apply topically as needed  . predniSONE (DELTASONE) 10 MG tablet Take 10 mg by mouth once daily  . tamsulosin (FLOMAX) 0.4 mg capsule Take 0.4 mg by mouth once daily  . tetrahydroz-peg 400-hyprom-gly 0.05-1-0.36-0.2 % Drop Place 2 drops into both eyes as needed for Dry Eyes.  . triamcinolone 0.1 % cream Apply topically as directed  . methocarbamoL (ROBAXIN) 500 MG tablet 1/2-1 po qHS prn (Patient not taking: Reported on 04/30/2020 ) 30 tablet 5  . opicapone 50 mg Cap Take by mouth (Patient not taking: Reported on 04/30/2020 )  . valACYclovir (VALTREX) 1000 MG tablet Take by mouth (Patient not taking: Reported on 04/30/2020 )  No current facility-administered medications for this visit.   Allergies: Influenza virus vaccine qv 2015-16(18 thru 64 yrs) and Others  Social and Family History  Social History reports that he quit smoking about 12 years ago. His smoking use included cigars. He started smoking about 17 years ago. He has a 7.50 pack-year smoking history. He  has never used smokeless tobacco. He reports current alcohol use. He reports that he does not use drugs.  Family History Family History  Problem Relation Age of Onset  . Coronary Artery Disease (Blocked arteries around heart) Father  . Rheum arthritis Maternal Grandmother  . Diabetes type II Paternal Grandfather  . Myocardial Infarction (Heart attack) Maternal Grandfather  . Coronary Artery Disease (Blocked arteries around heart) Maternal Grandfather  . Colon cancer Neg Hx  . Colon polyps Neg Hx  . Rectal cancer Neg Hx  . Liver disease Neg Hx  . Ulcers Neg Hx   Review of Systems   Review of Systems: The patient denies chest pain, shortness of breath, orthopnea, paroxysmal nocturnal dyspnea, pedal edema, reports occasional palpitations, without prolonged heart racing, presyncope, syncope, without dizziness, lightheadedness, or poor energy. Review of 10 Systems is negative except as described above.  Physical Examination   Vitals:BP 92/70 (BP Location: Left upper arm, Patient Position: Sitting, BP Cuff Size: Adult)  Pulse 67  Resp 16  Ht 176.5 cm (5' 9.5")  Wt 75 kg (165 lb 5.8 oz)  SpO2 97%  BMI 24.07 kg/m  Ht:176.5 cm (5' 9.5") Wt:75 kg (165 lb 5.8 oz) ZOX:WRUE surface area is 1.92 meters squared. Body mass index is 24.07 kg/m.  General: Alert and oriented. Well-appearing. No acute distress. HEENT: Pupils equally reactive to light and accomodation  Neck: Supple, no JVD Lungs: Normal effort of breathing; clear to auscultation bilaterally; no wheezes, rales, rhonchi Heart: Irregularly irregular, rate controlled. Muffled heart sounds. No murmur, rub, or gallop Abdomen: nondistended Extremities: no cyanosis, clubbing, or edema Peripheral Pulses: 2+ radial bilaterally Skin: Warm, dry, no diaphoresis  Assessment   79 y.o. male with  1. Typical atrial flutter (CMS-HCC)  2. Heart palpitations  3. Hypotension, unspecified hypotension type   79 year old gentleman with a  history of Parkinson's disease and sleep apnea, with recent diagnosis of paroxysmal atrial flutter, on Cardizem CD 120 mg daily, and on Eliquis for stroke prevention. He is minimally symptomatic. The patient's blood pressure and heart rate are chronically low. Blood pressure today is 92/70; patient is asymptomatic. Patient has no JVD, does not appear fluid overloaded, reports feeling generally well, and is rate controlled.  Plan   1. Continue current medications 2. Continue Eliquis for stroke prevention 3. Continue Cardizem CD 120 mg daily for rate control 4. Return precautions discussed with the patient 5. Return to clinic for follow-up in 3 months  No orders of the defined types were placed in this encounter.  Return in about 3 months (around 07/31/2020). I personally performed the service, non-incident to. (WP)   ANNA MARIA DRANE, PA-C  The patient was also evaluated by Dr. Ubaldo Glassing and the plan was made in collaboration with him.   Electronically signed by [name] [name], [place] at 04/30/2020 4:36 PM EDT  Plan of Treatment - documented as of this encounter Upcoming Encounters Upcoming Encounters  Date Type Specialty Care Team Description  07/30/2020 Office Visit Cardiology Isaias Cowman, MD  [place]  [facility]  [place], [place] [address]  [phone]  [phone] ([address])    Margarito Courser, Jeraldine Loots, [place]  [place]  [facility]  [place], [place] [address]  [phone]  [phone] (Fax)    04/17/2021 Office Visit Neurology Kandy Garrison, [place]  [place], [place] [address]  [phone]  [phone] ([address]    Evlyn Courier, NP  [place]  [place], [place] [address]  [phone]  [phone] (Fax)    Goals - documented as of this encounter Goal Patient Goal Type Associated Problems Recent Progress Patient-Stated? Author  Eat more fruits and vegetables  Diet   Yes [place], Caryl Pina,  CMA  Note:   Formatting of this note might be different from the original. Eat healthier and reduce stress.    Visit Diagnoses - documented in this encounter Diagnosis  Typical atrial flutter (CMS-HCC) - Primary   Heart palpitations  Palpitations   Hypotension, unspecified hypotension type   Discontinued Medications - documented as of this encounter Medication Sig Discontinue Reason Start Date End Date  ibuprofen (ADVIL,MOTRIN) 600 MG tablet  Take 600 mg by mouth as needed for Pain. Therapy completed  04/30/2020  Images Patient Demographics  Patient Address Communication Language Race / Ethnicity Marital Status  [address] [place], [place] [address] [phone] [facility]) [phone] (Home) [email or web address] English (Preferred) White / Not Hispanic or Latino Married  Patient Contacts  Contact Name Contact Address Communication Relationship to Patient  Llewellyn Choplin Unknown [phone] [facility]) Spouse, Emergency Contact  Document Information  Primary Care Provider Other Service Providers Document Coverage Dates  Armando Reichert, MD (Dec. 09, 2020December 09, 2020 - Present) [phone] (Work) [phone] (Fax) [facility] [place], [place] [address] Family Medicine    Aug. 24, 2021August 24, 2021   [employer] [address] [place], [place] [address]   Encounter Providers Encounter Date  [name] [name], [place] (Attending) [phone] (Work) [phone] (Fax) [place], [place] [address] Cardiovascular Disease Aug. 24, 2021August 24, 2021    Show All Sections

## 2020-07-15 NOTE — Patient Instructions (Addendum)
Your procedure is scheduled on:07-16-20 TUESDAY Report to the Registration Desk on the 1st floor of the [facility]. To find out your arrival time, please call [phone] between 1PM - 3PM on:07-15-20 MONDAY  REMEMBER: Instructions that are not followed completely may result in serious medical risk, up to and including death; or upon the discretion of your surgeon and anesthesiologist your surgery may need to be rescheduled.  Do not eat food after midnight the night before surgery.  No gum chewing, lozengers or hard candies.  You may however, drink CLEAR liquids up to 2 hours before you are scheduled to arrive for your surgery. Do not drink anything within 2 hours of your scheduled arrival time.  Clear liquids include: - water  - apple juice without pulp - gatorade (not RED, PURPLE, OR BLUE) - black coffee or tea (Do NOT add milk or creamers to the coffee or tea) Do NOT drink anything that is not on this list.  TAKE THESE MEDICATIONS THE MORNING OF SURGERY WITH A SIP OF WATER: -DILTIAZEM (CARDIZEM) -SINEMET (CARBIDOPA-LEVADOPA) -PREDNISONE -ZYTIGA  -FLOMAX (TAMSULOSIN) -YOU MAY TAKE TRAMADOL (ULTRAM) IF NEEDED  Follow recommendations from Cardiologist, Pulmonologist or PCP regarding stopping Aspirin, Coumadin, Plavix, Eliquis, Pradaxa, or Pletal-PT WAS INSTRUCTED TO STOP ELIQUIS 2 DAYS PRIOR TO SURGERY-LAST DOSE OF ELIQUIS WAS ON 07-13-20   One week prior to surgery: Stop Anti-inflammatories (NSAIDS) such as Advil, Aleve, Ibuprofen, Motrin, Naproxen, Naprosyn and Aspirin based products such as Excedrin, Goodys Powder, BC Powder-OK TO TAKE TYLENOL/TRAMADOL IF NEEDED  Stop ANY OVER THE COUNTER supplements until after surgery.  No Alcohol for 24 hours before or after surgery.  No Smoking including e-cigarettes for 24 hours prior to surgery.  No chewable tobacco products for at least 6 hours prior to surgery.  No nicotine patches on the day of surgery.  Do not use any  "recreational" drugs for at least a week prior to your surgery.  Please be advised that the combination of cocaine and anesthesia may have negative outcomes, up to and including death. If you test positive for cocaine, your surgery will be cancelled.  On the morning of surgery brush your teeth with toothpaste and water, you may rinse your mouth with mouthwash if you wish. Do not swallow any toothpaste or mouthwash.  Do not wear jewelry, make-up, hairpins, clips or nail polish.  Do not wear lotions, powders, or perfumes.   Do not shave 48 hours prior to surgery.   Contact lenses, hearing aids and dentures may not be worn into surgery.  Do not bring valuables to the hospital. [facility] is not responsible for any missing/lost belongings or valuables.   Bring your C-PAP to the hospital with you in case you may have to spend the night.   Notify your doctor if there is any change in your medical condition (cold, fever, infection).  Wear comfortable clothing (specific to your surgery type) to the hospital.  Plan for stool softeners for home use; pain medications have a tendency to cause constipation. You can also help prevent constipation by eating foods high in fiber such as fruits and vegetables and drinking plenty of fluids as your diet allows.  After surgery, you can help prevent lung complications by doing breathing exercises.  Take deep breaths and cough every 1-2 hours. Your doctor may order a device called an Incentive Spirometer to help you take deep breaths. When coughing or sneezing, hold a pillow firmly against your incision with both hands. This is called "  splinting." Doing this helps protect your incision. It also decreases belly discomfort.  If you are being admitted to the hospital overnight, leave your suitcase in the car. After surgery it may be brought to your room.  If you are being discharged the day of surgery, you will not be allowed to drive home. You will need a  responsible adult (18 years or older) to drive you home and stay with you that night.   If you are taking public transportation, you will need to have a responsible adult (18 years or older) with you. Please confirm with your physician that it is acceptable to use public transportation.   Please call the [facility] at [phone] if you have any questions about these instructions.  Visitation Policy:  Patients undergoing a surgery or procedure may have one family member or support person with them as long as that person is not COVID-19 positive or experiencing its symptoms.  That person may remain in the waiting area during the procedure.  Inpatient Visitation Update:   In an effort to ensure the safety of our team members and our patients, we are implementing a change to our visitation policy:  Effective Monday, Aug. 9, at 7 a.m., inpatients will be allowed one support person.  o The support person may change daily.  o The support person must pass our screening, gel in and out, and wear a mask at all times, including in the patient's room.  o Patients must also wear a mask when staff or their support person are in the room.  o Masking is required regardless of vaccination status.  Systemwide, no visitors 17 or younger.

## 2020-07-15 NOTE — Anesthesia Preprocedure Evaluation (Deleted)
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

## 2020-07-16 ENCOUNTER — Telehealth: Payer: Self-pay | Admitting: Neurology

## 2020-07-16 ENCOUNTER — Other Ambulatory Visit: Payer: Self-pay

## 2020-07-16 ENCOUNTER — Encounter
Admission: RE | Disposition: A | Discharge status: Home / Self Care | Payer: Self-pay | Source: Home / Self Care | Attending: Orthopedic Surgery

## 2020-07-16 ENCOUNTER — Encounter: Payer: Self-pay | Admitting: Orthopedic Surgery

## 2020-07-16 ENCOUNTER — Ambulatory Visit
Admission: RE | Admit: 2020-07-16 | Discharge: 2020-07-16 | Disposition: A | Discharge status: Home / Self Care | Payer: Medicare Other | Attending: Orthopedic Surgery | Admitting: Orthopedic Surgery

## 2020-07-16 DIAGNOSIS — G20 Parkinson's disease: Secondary | ICD-10-CM | POA: Diagnosis not present

## 2020-07-16 DIAGNOSIS — I4891 Unspecified atrial fibrillation: Secondary | ICD-10-CM | POA: Insufficient documentation

## 2020-07-16 DIAGNOSIS — Z5309 Procedure and treatment not carried out because of other contraindication: Secondary | ICD-10-CM | POA: Insufficient documentation

## 2020-07-16 DIAGNOSIS — I4892 Unspecified atrial flutter: Secondary | ICD-10-CM | POA: Insufficient documentation

## 2020-07-16 DIAGNOSIS — X58XXXA Exposure to other specified factors, initial encounter: Secondary | ICD-10-CM | POA: Diagnosis not present

## 2020-07-16 DIAGNOSIS — Z8546 Personal history of malignant neoplasm of prostate: Secondary | ICD-10-CM | POA: Insufficient documentation

## 2020-07-16 DIAGNOSIS — G4733 Obstructive sleep apnea (adult) (pediatric): Secondary | ICD-10-CM | POA: Insufficient documentation

## 2020-07-16 DIAGNOSIS — S32029A Unspecified fracture of second lumbar vertebra, initial encounter for closed fracture: Secondary | ICD-10-CM | POA: Insufficient documentation

## 2020-07-16 SURGERY — KYPHOPLASTY
Anesthesia: Choice

## 2020-07-16 MED ORDER — CHLORHEXIDINE GLUCONATE 0.12 % MT SOLN
OROMUCOSAL | Status: AC
  Administered 2020-07-16: 15 mL via OROMUCOSAL
  Filled 2020-07-16: qty 15

## 2020-07-16 MED ORDER — DILTIAZEM LOAD VIA INFUSION: 5.0000 mg | Freq: Once | INTRAVENOUS | Status: DC

## 2020-07-16 MED ORDER — FAMOTIDINE 20 MG PO TABS
ORAL_TABLET | ORAL | Status: AC
  Administered 2020-07-16: 20 mg via ORAL
  Filled 2020-07-16: qty 1

## 2020-07-16 MED ORDER — DILTIAZEM HCL 30 MG PO TABS
30.0000 mg | ORAL_TABLET | Freq: Once | ORAL | Status: DC
  Filled 2020-07-16: qty 1

## 2020-07-16 MED ORDER — FAMOTIDINE 20 MG PO TABS: 20.0000 mg | ORAL_TABLET | Freq: Once | ORAL | Status: AC

## 2020-07-16 MED ORDER — CHLORHEXIDINE GLUCONATE 0.12 % MT SOLN: 15.0000 mL | Freq: Once | OROMUCOSAL | Status: AC

## 2020-07-16 MED ORDER — ORAL CARE MOUTH RINSE: 15.0000 mL | Freq: Once | OROMUCOSAL | Status: AC

## 2020-07-16 MED ORDER — LACTATED RINGERS IV SOLN: INTRAVENOUS | Status: DC

## 2020-07-16 MED ORDER — SODIUM CHLORIDE (PF) 0.9 % IJ SOLN
INTRAMUSCULAR | Status: AC
  Filled 2020-07-16: qty 10

## 2020-07-16 MED ORDER — CARBIDOPA-LEVODOPA 25-100 MG PO TABS
ORAL_TABLET | ORAL | 0 refills | Status: DC
Start: 2020-07-16 — End: 2020-08-14

## 2020-07-16 MED ORDER — CEFAZOLIN SODIUM-DEXTROSE 1-4 GM/50ML-% IV SOLN
INTRAVENOUS | Status: AC
  Filled 2020-07-16: qty 50

## 2020-07-16 MED ORDER — CEFAZOLIN SODIUM-DEXTROSE 1-4 GM/50ML-% IV SOLN: 1.0000 g | INTRAVENOUS | Status: DC

## 2020-07-16 MED ORDER — DILTIAZEM HCL 25 MG/5ML IV SOLN
5.0000 mg | Freq: Once | INTRAVENOUS | Status: AC
  Administered 2020-07-16: 5 mg via INTRAVENOUS
  Filled 2020-07-16: qty 5

## 2020-07-16 SURGICAL SUPPLY — 20 items
COVER WAND RF STERILE (DRAPES) ×3 IMPLANT
DERMABOND ADVANCED (GAUZE/BANDAGES/DRESSINGS) ×2
DERMABOND ADVANCED .7 DNX12 (GAUZE/BANDAGES/DRESSINGS) ×1 IMPLANT
DEVICE BIOPSY BONE KYPHX (INSTRUMENTS) ×3 IMPLANT
DRAPE C-ARM XRAY 36X54 (DRAPES) ×3 IMPLANT
DURAPREP 26ML APPLICATOR (WOUND CARE) ×3 IMPLANT
GLOVE SURG SYN 9.0  PF PI (GLOVE) ×2
GLOVE SURG SYN 9.0 PF PI (GLOVE) ×1 IMPLANT
GOWN SRG 2XL LVL 4 RGLN SLV (GOWNS) ×1 IMPLANT
GOWN STRL NON-REIN 2XL LVL4 (GOWNS) ×2
GOWN STRL REUS W/ TWL LRG LVL3 (GOWN DISPOSABLE) ×1 IMPLANT
GOWN STRL REUS W/TWL LRG LVL3 (GOWN DISPOSABLE) ×2
MANIFOLD NEPTUNE II (INSTRUMENTS) ×3 IMPLANT
PACK KYPHOPLASTY (MISCELLANEOUS) ×3 IMPLANT
RENTAL RFA  GENERATOR (MISCELLANEOUS)
RENTAL RFA GENERATOR (MISCELLANEOUS) IMPLANT
STRAP SAFETY 5IN WIDE (MISCELLANEOUS) ×3 IMPLANT
SWABSTK COMLB BENZOIN TINCTURE (MISCELLANEOUS) ×3 IMPLANT
TRAY KYPHOPAK 15/3 EXPRESS 1ST (MISCELLANEOUS) ×3 IMPLANT
TRAY KYPHOPAK 20/3 EXPRESS 1ST (MISCELLANEOUS) ×3 IMPLANT

## 2020-07-16 NOTE — Telephone Encounter (Signed)
Okay for 30 day supply only to get to appt

## 2020-07-16 NOTE — Progress Notes (Signed)
Patient in preop had irregular HR in the 140s, EKG showing atrial fibrillation . Patient has hx of a-flutter requiring diltiazem cardioversion last year. Patient asymptomatic.  Dr Ubaldo Glassing (cardiologist) consulted to provide guidance - he advised giving a 5mg  IV dose of diltiazem and if the rhythm broke it would be reasonable to proceed. If not, it would be safest to cancel today's procedure to address cardiovascular status on an outpatient basis. Dr Ubaldo Glassing did not believe any inpatient admission was warranted on this asymptomatic patient.   30 minutes after the diltiazem administration, patient's heart rate still atrial fibrillation in the 120s-130s. Will cancel case.

## 2020-07-16 NOTE — Telephone Encounter (Signed)
Patient called and requested refills on carbidopa-levodopa 25-100 MG. He is scheduled for 08/15/20 for follow up with Dr. Carles Collet and almost out of the medication.  [facility]

## 2020-07-16 NOTE — Telephone Encounter (Signed)
Patient notifed and voiced understanding.

## 2020-07-16 NOTE — Consult Note (Signed)
Patient Name: [name] [name] Date of Encounter: 07/16/2020  Hospital Problem List     Active Problems:   * No active hospital problems. *    Patient Profile     79 y.o.male patient with a history of paroxysmal atrial flutter, Parkinson's disease, prostate cancer, and OSA on CPAP. The patient was seen on 08/16/2019 with 10-day history of intermittent episodes of elevated heart rate. ECG revealed narrow complex tachycardia rate of 130 bpm. The patient was sent to [facility] ED where he was treated with Cardizem bolus and drip. ECG revealed atrial flutter; he then converted to sinus rhythm. The patient was maintained on Cardizem CD 120 mg daily, and started on Eliquis 5 mg twice daily for stroke prevention. 2D echocardiogram 08/17/2019 revealed normal left ventricular function, with LVEF of 50 to 55%. He now presents for elective kyphoplasty and was oted to be in afib with rvr at rates in the upper 140's. He is asymptomatic from this and states that he occasionally will note his heart rate in the range at home. He usually is not symptomatic when this occurs. He took his cardizem 120 this am but has been off of his eliquis. He denies chest pain.    Subjective   Back pain. No chest pain  Inpatient Medications    . diltiazem  30 mg Oral Once    Vital Signs    Vitals:   07/16/20 1140  BP: 125/89  Pulse: 80  Resp: 18  Temp: (!) 96.3 F (35.7 C)  TempSrc: Tympanic  SpO2: 98%  Weight: 68.9 kg  Height: 5\' 9"  (1.753 m)   No intake or output data in the 24 hours ending 07/16/20 1212 Filed Weights   07/16/20 1140  Weight: 68.9 kg    Physical Exam    GEN: Well nourished, well developed, in no acute distress.  HEENT: normal.  Neck: Supple, no JVD, carotid bruits, or masses. Cardiac: tachycardia Respiratory:  Respirations regular and unlabored, clear to auscultation bilaterally. GI: Soft, nontender, nondistended, BS + x 4. MS: no deformity or atrophy. Skin: warm and dry, no  rash. Neuro:  Strength and sensation are intact. Psych: Normal affect.  Labs    CBC No results for input(s): WBC, NEUTROABS, HGB, HCT, MCV, PLT in the last 72 hours. Basic Metabolic Panel No results for input(s): NA, K, CL, CO2, GLUCOSE, BUN, CREATININE, CALCIUM, MG, PHOS in the last 72 hours. Liver Function Tests No results for input(s): AST, ALT, ALKPHOS, BILITOT, PROT, ALBUMIN in the last 72 hours. No results for input(s): LIPASE, AMYLASE in the last 72 hours. Cardiac Enzymes No results for input(s): CKTOTAL, CKMB, CKMBINDEX, TROPONINI in the last 72 hours. BNP No results for input(s): BNP in the last 72 hours. D-Dimer No results for input(s): DDIMER in the last 72 hours. Hemoglobin A1C No results for input(s): HGBA1C in the last 72 hours. Fasting Lipid Panel No results for input(s): CHOL, HDL, LDLCALC, TRIG, CHOLHDL, LDLDIRECT in the last 72 hours. Thyroid Function Tests No results for input(s): TSH, T4TOTAL, T3FREE, THYROIDAB in the last 72 hours.  Invalid input(s): FREET3  Telemetry    afib with rvr  ECG    afib with rvr  Radiology    MR LUMBAR SPINE WO CONTRAST  Result Date: 07/06/2020 CLINICAL DATA:  Back pain. EXAM: MRI LUMBAR SPINE WITHOUT CONTRAST TECHNIQUE: Multiplanar, multisequence MR imaging of the lumbar spine was performed. No intravenous contrast was administered. COMPARISON:  04/15/2020 FINDINGS: Segmentation: There are five lumbar type vertebral bodies.  The last full intervertebral disc space is labeled L5-S1. Alignment:  Normal Vertebrae: Acute inferior endplate compression fracture of L2 with approximately 40% compression. No significant retropulsion or canal compromise. The other lower thoracic and lumbar vertebral bodies are intact. Conus medullaris and cauda equina: Conus extends to the L1 level. Conus and cauda equina appear normal. Paraspinal and other soft tissues: Mild paraspinal soft tissue swelling/hematoma at L2. Disc levels: T12-L1: No  significant findings. L1-2: No significant findings. L2-3: Mild retropulsion of L2 inferiorly with mild canal encroachment and mild bilateral lateral recess stenosis. No significant spinal stenosis or foraminal stenosis. L3-4: Bulging annulus and mild facet disease. Slight flattening of the ventral thecal sac. There is a very small focal left paracentral disc protrusion potentially irritating the left L4 nerve root in the lateral recess. L4-5: Advanced degenerative disc disease and moderate facet disease. Bulging annulus and osteophytic ridging and ligamentum flavum thickening contributing to mild spinal stenosis and moderate bilateral lateral recess stenosis. No foraminal stenosis. L5-S1: Disc disease and facet disease and a shallow right paracentral disc protrusion. Potential irritation of the right S1 nerve root. No significant foraminal stenosis. IMPRESSION: 1. Acute inferior endplate compression fracture of L2 with approximately 40% compression. Mild retropulsion but no significant canal compromise. 2. Very small focal left paracentral disc protrusion at L3-4 potentially irritating the left L4 nerve root in the lateral recess. 3. Mild spinal stenosis and moderate bilateral lateral recess stenosis at L4-5. 4. Shallow right paracentral disc protrusion at L5-S1. Potential irritation of the right S1 nerve root. Electronically Signed   By: Marijo Sanes M.D.   On: 07/06/2020 12:29    Assessment & Plan    78 y.o.male patient with a history of paroxysmal atrial flutter, Parkinson's disease, prostate cancer, and OSA on CPAP. The patient was seen on 08/16/2019 with 10-day history of intermittent episodes of elevated heart rate. ECG revealed narrow complex tachycardia rate of 130 bpm. The patient was sent to [facility] ED where he was treated with Cardizem bolus and drip. ECG revealed atrial flutter; he then converted to sinus rhythm. The patient was maintained on Cardizem CD 120 mg daily, and started on Eliquis 5 mg twice  daily for stroke prevention. 2D echocardiogram 08/17/2019 revealed normal left ventricular function, with LVEF of 50 to 55%.   afib-rvr. Hemodynamically stable. Will try to break with iv cardizem times one. If rate des not improved, would discharge to home on his home meds and will see tomorrow in the clnic and continue workup and treatemnt of his afib.  Back pain-for kyphoplasty. Would proceed if his heart rate improves.    Signed, Javier Docker Aloise Copus MD 07/16/2020, 12:12 PM  Pager: [phone]

## 2020-07-16 NOTE — Telephone Encounter (Signed)
Rx(s) sent to pharmacy electronically.  

## 2020-07-16 NOTE — Progress Notes (Signed)
Routine EKG on admission showed Atrial Fibrillation with RVR.  Cardiology consult performed. Patient placed on cardiac monitoring and Cardizem 5 mg infused IV over 3 minutes.  Patient monitored continuously.  HR briefly dropped tp 105 but quickly increased to 120's.  Patient denies any SOB, CP or dizzyness.

## 2020-07-16 NOTE — Progress Notes (Signed)
Notified son on OR cancellation.  Appointment made with Dr. Saralyn Pilar for tomorrow morning at 9 AM, Patient verbalizes understanding.  Dr. Rudene Christians in to see patient prior to discharge.

## 2020-07-17 DIAGNOSIS — G4733 Obstructive sleep apnea (adult) (pediatric): Secondary | ICD-10-CM | POA: Diagnosis not present

## 2020-07-17 DIAGNOSIS — Z23 Encounter for immunization: Secondary | ICD-10-CM | POA: Diagnosis not present

## 2020-07-17 DIAGNOSIS — Z9989 Dependence on other enabling machines and devices: Secondary | ICD-10-CM | POA: Diagnosis not present

## 2020-07-17 DIAGNOSIS — R002 Palpitations: Secondary | ICD-10-CM | POA: Diagnosis not present

## 2020-07-17 DIAGNOSIS — G20 Parkinson's disease: Secondary | ICD-10-CM | POA: Diagnosis not present

## 2020-07-17 DIAGNOSIS — I483 Typical atrial flutter: Secondary | ICD-10-CM | POA: Diagnosis not present

## 2020-07-17 MED FILL — predniSONE 10 MG TABS: 10 | 30 days supply | Qty: 30 | Fill #6

## 2020-07-17 MED FILL — ABIRATERONE ACETATE 250 MG: 250 | 30 days supply | Qty: 120 | Fill #3

## 2020-07-19 DIAGNOSIS — G8929 Other chronic pain: Secondary | ICD-10-CM | POA: Diagnosis not present

## 2020-07-19 DIAGNOSIS — M5126 Other intervertebral disc displacement, lumbar region: Secondary | ICD-10-CM | POA: Diagnosis not present

## 2020-07-19 DIAGNOSIS — S32020A Wedge compression fracture of second lumbar vertebra, initial encounter for closed fracture: Secondary | ICD-10-CM | POA: Diagnosis not present

## 2020-07-19 DIAGNOSIS — M5136 Other intervertebral disc degeneration, lumbar region: Secondary | ICD-10-CM | POA: Diagnosis not present

## 2020-07-19 DIAGNOSIS — M5489 Other dorsalgia: Secondary | ICD-10-CM | POA: Diagnosis not present

## 2020-07-19 DIAGNOSIS — M5416 Radiculopathy, lumbar region: Secondary | ICD-10-CM | POA: Diagnosis not present

## 2020-07-19 DIAGNOSIS — M5441 Lumbago with sciatica, right side: Secondary | ICD-10-CM | POA: Diagnosis not present

## 2020-07-19 DIAGNOSIS — Z79899 Other long term (current) drug therapy: Secondary | ICD-10-CM | POA: Diagnosis not present

## 2020-07-31 DIAGNOSIS — I483 Typical atrial flutter: Secondary | ICD-10-CM | POA: Diagnosis not present

## 2020-07-31 DIAGNOSIS — G20 Parkinson's disease: Secondary | ICD-10-CM | POA: Diagnosis not present

## 2020-07-31 DIAGNOSIS — R002 Palpitations: Secondary | ICD-10-CM | POA: Diagnosis not present

## 2020-07-31 DIAGNOSIS — Z23 Encounter for immunization: Secondary | ICD-10-CM | POA: Diagnosis not present

## 2020-07-31 DIAGNOSIS — Z9989 Dependence on other enabling machines and devices: Secondary | ICD-10-CM | POA: Diagnosis not present

## 2020-07-31 DIAGNOSIS — G4733 Obstructive sleep apnea (adult) (pediatric): Secondary | ICD-10-CM | POA: Diagnosis not present

## 2020-08-02 DIAGNOSIS — J320 Chronic maxillary sinusitis: Secondary | ICD-10-CM | POA: Diagnosis not present

## 2020-08-07 ENCOUNTER — Other Ambulatory Visit: Payer: Self-pay | Admitting: Internal Medicine

## 2020-08-08 ENCOUNTER — Other Ambulatory Visit: Payer: Self-pay

## 2020-08-08 ENCOUNTER — Ambulatory Visit
Admission: RE | Admit: 2020-08-08 | Discharge: 2020-08-08 | Disposition: A | Discharge status: Home / Self Care | Payer: Medicare Other | Source: Ambulatory Visit | Attending: Internal Medicine | Admitting: Internal Medicine

## 2020-08-08 ENCOUNTER — Inpatient Hospital Stay: Payer: Medicare Other | Attending: Internal Medicine

## 2020-08-08 DIAGNOSIS — Z79899 Other long term (current) drug therapy: Secondary | ICD-10-CM | POA: Diagnosis not present

## 2020-08-08 DIAGNOSIS — C7951 Secondary malignant neoplasm of bone: Secondary | ICD-10-CM | POA: Insufficient documentation

## 2020-08-08 DIAGNOSIS — R634 Abnormal weight loss: Secondary | ICD-10-CM | POA: Diagnosis not present

## 2020-08-08 DIAGNOSIS — Z9049 Acquired absence of other specified parts of digestive tract: Secondary | ICD-10-CM | POA: Insufficient documentation

## 2020-08-08 DIAGNOSIS — I4891 Unspecified atrial fibrillation: Secondary | ICD-10-CM | POA: Diagnosis not present

## 2020-08-08 DIAGNOSIS — Z7983 Long term (current) use of bisphosphonates: Secondary | ICD-10-CM | POA: Diagnosis not present

## 2020-08-08 DIAGNOSIS — Z87891 Personal history of nicotine dependence: Secondary | ICD-10-CM | POA: Diagnosis not present

## 2020-08-08 DIAGNOSIS — R5383 Other fatigue: Secondary | ICD-10-CM | POA: Insufficient documentation

## 2020-08-08 DIAGNOSIS — K5900 Constipation, unspecified: Secondary | ICD-10-CM | POA: Insufficient documentation

## 2020-08-08 DIAGNOSIS — R232 Flushing: Secondary | ICD-10-CM | POA: Diagnosis not present

## 2020-08-08 DIAGNOSIS — C61 Malignant neoplasm of prostate: Secondary | ICD-10-CM

## 2020-08-08 DIAGNOSIS — N183 Chronic kidney disease, stage 3 unspecified: Secondary | ICD-10-CM | POA: Insufficient documentation

## 2020-08-08 DIAGNOSIS — Z803 Family history of malignant neoplasm of breast: Secondary | ICD-10-CM | POA: Diagnosis not present

## 2020-08-08 DIAGNOSIS — Z833 Family history of diabetes mellitus: Secondary | ICD-10-CM | POA: Insufficient documentation

## 2020-08-08 DIAGNOSIS — Z5181 Encounter for therapeutic drug level monitoring: Secondary | ICD-10-CM | POA: Insufficient documentation

## 2020-08-08 DIAGNOSIS — R29890 Loss of height: Secondary | ICD-10-CM | POA: Diagnosis not present

## 2020-08-08 DIAGNOSIS — Z7901 Long term (current) use of anticoagulants: Secondary | ICD-10-CM | POA: Diagnosis not present

## 2020-08-08 DIAGNOSIS — M549 Dorsalgia, unspecified: Secondary | ICD-10-CM | POA: Diagnosis not present

## 2020-08-08 DIAGNOSIS — Z8546 Personal history of malignant neoplasm of prostate: Secondary | ICD-10-CM | POA: Diagnosis not present

## 2020-08-08 DIAGNOSIS — G20 Parkinson's disease: Secondary | ICD-10-CM | POA: Diagnosis not present

## 2020-08-08 DIAGNOSIS — Z8249 Family history of ischemic heart disease and other diseases of the circulatory system: Secondary | ICD-10-CM | POA: Insufficient documentation

## 2020-08-08 LAB — CBC WITH DIFFERENTIAL/PLATELET
Abs Immature Granulocytes: 0.08 10*3/uL — ABNORMAL HIGH (ref 0.00–0.07)
Basophils Absolute: 0 10*3/uL (ref 0.0–0.1)
Basophils Relative: 0 %
Eosinophils Absolute: 0.1 10*3/uL (ref 0.0–0.5)
Eosinophils Relative: 1 %
HCT: 45.1 % (ref 39.0–52.0)
Hemoglobin: 14.5 g/dL (ref 13.0–17.0)
Immature Granulocytes: 1 %
Lymphocytes Relative: 7 %
Lymphs Abs: 0.8 10*3/uL (ref 0.7–4.0)
MCH: 30.7 pg (ref 26.0–34.0)
MCHC: 32.2 g/dL (ref 30.0–36.0)
MCV: 95.6 fL (ref 80.0–100.0)
Monocytes Absolute: 0.5 10*3/uL (ref 0.1–1.0)
Monocytes Relative: 5 %
Neutro Abs: 9.2 10*3/uL — ABNORMAL HIGH (ref 1.7–7.7)
Neutrophils Relative %: 86 %
Platelets: 247 10*3/uL (ref 150–400)
RBC: 4.72 MIL/uL (ref 4.22–5.81)
RDW: 12.8 % (ref 11.5–15.5)
WBC: 10.7 10*3/uL — ABNORMAL HIGH (ref 4.0–10.5)
nRBC: 0 % (ref 0.0–0.2)

## 2020-08-08 LAB — COMPREHENSIVE METABOLIC PANEL
ALT: 8 U/L (ref 0–44)
AST: 30 U/L (ref 15–41)
Albumin: 4.1 g/dL (ref 3.5–5.0)
Alkaline Phosphatase: 76 U/L (ref 38–126)
Anion gap: 12 (ref 5–15)
BUN: 23 mg/dL (ref 8–23)
CO2: 27 mmol/L (ref 22–32)
Calcium: 10.3 mg/dL (ref 8.9–10.3)
Chloride: 100 mmol/L (ref 98–111)
Creatinine, Ser: 1.52 mg/dL — ABNORMAL HIGH (ref 0.61–1.24)
GFR, Estimated: 47 mL/min — ABNORMAL LOW (ref >60)
Glucose, Bld: 93 mg/dL (ref 70–99)
Potassium: 4 mmol/L (ref 3.5–5.1)
Sodium: 139 mmol/L (ref 135–145)
Total Bilirubin: 2.4 mg/dL — ABNORMAL HIGH (ref 0.3–1.2)
Total Protein: 7.4 g/dL (ref 6.5–8.1)

## 2020-08-08 LAB — PSA: Prostatic Specific Antigen: 0.31 ng/mL (ref 0.00–4.00)

## 2020-08-09 ENCOUNTER — Other Ambulatory Visit: Payer: Medicare Other

## 2020-08-09 ENCOUNTER — Other Ambulatory Visit: Payer: Self-pay

## 2020-08-09 DIAGNOSIS — C7951 Secondary malignant neoplasm of bone: Secondary | ICD-10-CM

## 2020-08-12 ENCOUNTER — Inpatient Hospital Stay: Payer: Medicare Other

## 2020-08-12 ENCOUNTER — Encounter: Payer: Self-pay | Admitting: Internal Medicine

## 2020-08-12 ENCOUNTER — Inpatient Hospital Stay (HOSPITAL_BASED_OUTPATIENT_CLINIC_OR_DEPARTMENT_OTHER): Payer: Medicare Other | Admitting: Internal Medicine

## 2020-08-12 ENCOUNTER — Other Ambulatory Visit: Payer: Self-pay

## 2020-08-12 VITALS — BP 85/65 | HR 90 | Temp 97.7°F | Resp 20 | Wt 152.0 lb

## 2020-08-12 DIAGNOSIS — I4891 Unspecified atrial fibrillation: Secondary | ICD-10-CM | POA: Diagnosis not present

## 2020-08-12 DIAGNOSIS — G20 Parkinson's disease: Secondary | ICD-10-CM | POA: Diagnosis not present

## 2020-08-12 DIAGNOSIS — R000 Tachycardia, unspecified: Secondary | ICD-10-CM | POA: Diagnosis not present

## 2020-08-12 DIAGNOSIS — C61 Malignant neoplasm of prostate: Secondary | ICD-10-CM | POA: Diagnosis not present

## 2020-08-12 DIAGNOSIS — I959 Hypotension, unspecified: Secondary | ICD-10-CM | POA: Diagnosis not present

## 2020-08-12 DIAGNOSIS — C7951 Secondary malignant neoplasm of bone: Secondary | ICD-10-CM

## 2020-08-12 DIAGNOSIS — Z01818 Encounter for other preprocedural examination: Secondary | ICD-10-CM | POA: Diagnosis not present

## 2020-08-12 DIAGNOSIS — R5383 Other fatigue: Secondary | ICD-10-CM | POA: Diagnosis not present

## 2020-08-12 DIAGNOSIS — N183 Chronic kidney disease, stage 3 unspecified: Secondary | ICD-10-CM | POA: Diagnosis not present

## 2020-08-12 NOTE — Progress Notes (Signed)
Pt and daughter in for follow up, pt reports having back pain and is having surgery on Thursday.   Pt heart rate 162 manually and BP 84/68 manually per wall cuff and stethoscope.  Pt skin is diaphoretic to touch.

## 2020-08-12 NOTE — Patient Instructions (Signed)
#   STOP ZYTIGA + prednisone until next visit.

## 2020-08-12 NOTE — Assessment & Plan Note (Addendum)
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 On Eligard [urology/ q32m;last 03/27/2020] + Zytiga+prednsione. AUG  2021-0.32 trending down; STABLE.   #Hold Zytiga plus prednisone at this time-see regarding A. Fib/RVR. Patient has poor tolerance I think is reasonable to hold off Zytiga plus prednisone. Will discuss further.  #A. fib with RVR-systolic blood pressures 47X; heart rate 140s to 90s. Stat EKG ordered reviewed. Discussed with cardiology, Dr.Parashoes as patient is asymptomatic-repeat blood pressures in the 90s okay to go home. However need to contact cardiology ASAP. Patient and daughter verbalized. Continue Eliquis.  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 HOLD recast today given A. fib RVR.  # DISPOSITION: # HOLD Reclast today.  # follow up in 6 weeks- -MD; Labs-1-2 day prior- cbc/cmp/PSA; RECLAST -Dr.B

## 2020-08-12 NOTE — Progress Notes (Signed)
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 Sclerotic 1.5 cm left ischial lesion/ Sclerotic medial left iliac bone 1.5 cm lesion (series 2/image 47), increased from 1.0 cm. Gleason score of 4+5= 9; with almost all cores involved greater than 80%.  9/16 Lupron 11-month depot on 9/16. [Urology; Dr.Siniski]  # MID OCT 2020- Zytiga 1000 mg+ prednisone  # Parkinsons's syndrome [Dr.Tat; [place]; neurologist]; CKD-III [creat1.3-1.5]  # GC- referred  # DECLINES- Palliative care [316/2021]  DIAGNOSIS: Prostate cancer  STAGE:     4    ;  GOALS: Palliative/control  CURRENT/MOST RECENT THERAPY : Lupron+ Zytiga      Prostate cancer metastatic to bone ([place])  05/24/2019 Initial Diagnosis   Prostate cancer metastatic to bone ([facility])    HISTORY OF PRESENTING ILLNESS:  JAVELL BLACKBURN 79 y.o.  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 fib with RVR. Is due for surgery again on December 9. In the interim he was evaluated by cardiology.  Patient feels fatigued. However denies any syncopal episodes or worsening palpitations. He is accompanied by his daughter today. No swelling in the legs. No worsening shortness of breath or cough.  Review of Systems  Constitutional: Positive for malaise/fatigue. Negative for chills, diaphoresis, fever and weight loss.  HENT: Negative for nosebleeds and sore throat.   Eyes: Negative for double vision.  Respiratory: Negative for cough,  hemoptysis, sputum production, shortness of breath and wheezing.   Cardiovascular: Negative for chest pain, palpitations, orthopnea and leg swelling.  Gastrointestinal: Positive for constipation. Negative for abdominal pain, blood in stool, diarrhea, heartburn, melena, nausea and vomiting.  Genitourinary: Positive for frequency and urgency. Negative for dysuria.  Musculoskeletal: Negative for back pain and joint pain.  Skin: Negative.  Negative for itching and rash.  Neurological: Positive for tremors. Negative for dizziness, tingling, focal weakness, weakness and headaches.  Endo/Heme/Allergies: Does not bruise/bleed easily.  Psychiatric/Behavioral: Negative for depression. The patient is not nervous/anxious and does not have insomnia.     MEDICAL HISTORY:  Past Medical History:  Diagnosis Date  . Atrial flutter ([place])   . Cancer ([place])    PROSTATE  . Parkinson's disease ([place])   . REM sleep behavior disorder   . Sleep apnea    CPAP    SURGICAL HISTORY: Past Surgical History:  Procedure Laterality Date  . APPENDECTOMY    . 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 KYPHOPLASTY N/A 08/15/2020   Procedure: L2 compression fracture;  Surgeon: Hessie Knows, MD;  Location: [facility] ORS;  Service: Orthopedics;  Laterality: N/A;    SOCIAL HISTORY: Social History   Socioeconomic History  . Marital status: Married    Spouse name: Not on file  . Number of children: Not on file  . Years of education: Not on file  . Highest education level: Not on file  Occupational History  . Occupation: retired    Comment: Nutritional therapist  Tobacco Use  . Smoking status: Former Smoker    Years: 4.00    Types: Cigars    Quit date: 09/07/2010  Years since quitting: 9.9  . Smokeless tobacco: Never Used  . Tobacco comment: 2 CIGARS PER DAY  Vaping Use  . Vaping Use: Never used  Substance and Sexual Activity  . Alcohol use: Not Currently    Comment: 1 can beer/day  . Drug use: Never  . Sexual activity: Not  Currently  Other Topics Concern  . Not on file  Social History Narrative   Lives in [place]; quit smoking in 2012; ocassional alcohol; Camera operator; wife; 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    Social Determinants of Health   Financial Resource Strain: Not on file  Food Insecurity: Not on file  Transportation Needs: Not on file  Physical Activity: Not on file  Stress: Not on file  Social Connections: Not on file  Intimate Partner Violence: Not on file    FAMILY HISTORY: Family History  Problem Relation Age of Onset  . Heart disease Maternal Grandfather   . Diabetes Paternal Grandfather   . Breast cancer Daughter     ALLERGIES:  is allergic to influenza vaccines.  MEDICATIONS:  Current Outpatient Medications  Medication Sig Dispense Refill  . abiraterone acetate (ZYTIGA) 250 MG tablet TAKE 4 TABLETS (1,000 MG TOTAL) BY MOUTH DAILY. TAKE ON AN EMPTY STOMACH 1 HOUR BEFORE OR 2 HOURS AFTER A MEAL (Patient taking differently: Take 1,000 mg by mouth daily after lunch. Take on an empty stomach 1 hour before or 2 hours after a meal-AFTERNOON) 120 tablet 4  . apixaban (ELIQUIS) 5 MG TABS tablet Take 1 tablet (5 mg total) by mouth 2 (two) times daily. 60 tablet 1  . clonazePAM (KLONOPIN) 0.5 MG tablet Take 0.5 mg by mouth at bedtime. Dr Lisabeth Devoid    . diltiazem (CARDIZEM CD) 120 MG 24 hr capsule Take 1 capsule (120 mg total) by mouth daily. 30 capsule 2  . Glycerin-Hypromellose-PEG 400 (VISINE DRY EYE OP) Place 1 drop into both eyes 2 (two) times daily as needed (eye irritation).     Marland Kitchen ketoconazole (NIZORAL) 2 % shampoo Apply 1 application topically once a week.     . predniSONE (DELTASONE) 10 MG tablet TAKE 1 TABLET BY MOUTH ONCE DAILY WITH BREAKFAST (Patient not taking: Reported on 08/15/2020) 30 tablet 6  . tamsulosin (FLOMAX) 0.4 MG CAPS capsule Take 1 capsule (0.4 mg total) by mouth daily. 90 capsule 1  . traMADol (ULTRAM) 50 MG tablet Take 50 mg by mouth 2 (two) times daily as needed for  pain.    . carbidopa-levodopa (SINEMET IR) 25-100 MG tablet TAKE 2 TABLETS BY MOUTH AT 7 AM, 11AM AND 3 PM AND TAKE 1 TABLET AT 7 PM 210 tablet 0  . HYDROcodone-acetaminophen (NORCO) 5-325 MG tablet Take 1 tablet by mouth every 6 (six) hours as needed for moderate pain. 30 tablet 0   No current facility-administered medications for this visit.      Marland Kitchen  PHYSICAL EXAMINATION: ECOG PERFORMANCE STATUS: 0 - Asymptomatic  Vitals:   08/12/20 1136 08/12/20 1236  BP: (!) 79/65 (!) 85/65  Pulse: (!) 158 90  Resp: 20   Temp: 97.7 F (36.5 C)   SpO2: 97%    Filed Weights   08/12/20 1136  Weight: 152 lb (68.9 kg)    Physical Exam HENT:     Head: Normocephalic and atraumatic.     Mouth/Throat:     Pharynx: No oropharyngeal exudate.  Eyes:     Pupils: Pupils are equal, round, and reactive to light.  Cardiovascular:     Rate and  Rhythm: Normal rate and regular rhythm.  Pulmonary:     Effort: No respiratory distress.     Breath sounds: No wheezing.  Abdominal:     General: Bowel sounds are normal. There is no distension.     Palpations: Abdomen is soft. There is no mass.     Tenderness: There is no abdominal tenderness. There is no guarding or rebound.  Musculoskeletal:        General: No tenderness. Normal range of motion.     Cervical back: Normal range of motion and neck supple.  Skin:    General: Skin is warm.  Neurological:     Mental Status: He is alert and oriented to person, place, and time.     Comments: Tremor of right upper extremity.  Psychiatric:        Mood and Affect: Affect normal.      LABORATORY DATA:  I have reviewed the data as listed Lab Results  Component Value Date   WBC 10.7 (H) 08/08/2020   HGB 14.5 08/08/2020   HCT 45.1 08/08/2020   MCV 95.6 08/08/2020   PLT 247 08/08/2020   Recent Labs    02/12/20 1053 03/25/20 1048 05/06/20 0935 07/05/20 1132 08/08/20 0934  NA 138 138 142 140 139  K 4.1 3.6 3.7 3.9 4.0  CL 102 104 108 107 100  CO2  27 25 25 25 27   GLUCOSE 132* 128* 108* 124* 93  BUN 25* 21 21 21 23   CREATININE 1.77* 1.46* 1.60* 1.31* 1.52*  CALCIUM 9.7 9.5 9.5 9.8 10.3  GFRNONAA 36* 45* 41* 56* 47*  GFRAA 42* 53* 47*  --   --   PROT 6.9 6.5 6.7 7.1 7.4  ALBUMIN 4.0 3.8 3.9 4.0 4.1  AST 42* 28 31 33 30  ALT 6 8 6 8 8   ALKPHOS 63 61 53 86 76  BILITOT 1.6* 1.8* 2.1* 2.0* 2.4*    RADIOGRAPHIC STUDIES: I have personally reviewed the radiological images as listed and agreed with the findings in the report. DG Lumbar Spine 2-3 Views  Result Date: 08/15/2020 CLINICAL DATA:  Surgery, elective. Additional history provided: L2 kyphoplasty. Provided fluoroscopy time 1 minutes, 20 seconds. EXAM: LUMBAR SPINE - 2-3 VIEW; DG C-ARM 1-60 MIN COMPARISON:  Lumbar spine MRI 07/06/2020. FINDINGS: PA and lateral view intraoperative fluoroscopic images of the lumbar spine are submitted, two images total. The images demonstrate kyphoplasty material within the L2 vertebral body at site of a known compression fracture. IMPRESSION: Two intraoperative fluoroscopic images from L2 kyphoplasty, as described. Electronically Signed   By: Kellie Simmering DO   On: 08/15/2020 17:12   DG Bone Density  Result Date: 08/08/2020 EXAM: DUAL X-RAY ABSORPTIOMETRY (DXA) FOR BONE MINERAL DENSITY IMPRESSION: Your patient Dowell Hoon completed a BMD test on 08/08/2020 using the Progress (software version: 14.10) manufactured by [employer]. The following summarizes the results of our evaluation. Technologist: PATIENT BIOGRAPHICAL: Name: Ivan, Lacher Patient ID: [id number] Birth Date: Oct 20, 1940 Height:     66.5 in. Gender: Male Exam Date: 08/08/2020 Weight:     149.0 lbs. Indications: Height Loss, Hx. of Prostate Cancer, Parkinson's disease Fractures: Treatments: zytiga DENSITOMETRY RESULTS: Site      Region     Measured Date Measured Age WHO Classification Young Adult T-score BMD         %Change vs. Previous Significant Change (*) AP  Spine L1-L4 (L2) 08/08/[age] Osteoporosis -2.6 0.910 g/cm2 - - DualFemur Neck Right 8/08/[age]  [age] Osteoporosis -3.0 0.685 g/cm2 - - ASSESSMENT: The BMD measured at Femur Neck Right is 0.685 g/cm2 with a T-score of -3.0. This patient is considered OSTEOPOROTIC according to [facility]) criteria. L-2 was excluded due to degenerative changes. The scan quality is good. World Pharmacologist [facility]) criteria for post-menopausal, Caucasian Women: Normal:                   T-score at or above -1 SD Osteopenia/low bone mass: T-score between -1 and -2.5 SD Osteoporosis:             T-score at or below -2.5 SD RECOMMENDATIONS: 1. All patients should optimize calcium and vitamin D intake. 2. Consider FDA-approved medical therapies in postmenopausal women and men aged 44 years and older, based on the following: a. A hip or vertebral(clinical or morphometric) fracture b. T-score < -2.5 at the femoral neck or spine after appropriate evaluation to exclude secondary causes c. Low bone mass (T-score between -1.0 and -2.5 at the femoral neck or spine) and a 10-year probability of a hip fracture > 3% or a 10-year probability of a major osteoporosis-related fracture > 20% based on the [place]-adapted WHO algorithm 3. Clinician judgment and/or patient preferences may indicate treatment for people with 10-year fracture probabilities above or below these levels FOLLOW-UP: People with diagnosed cases of osteoporosis or osteopenia should be regularly tested for bone mineral density. For patients eligible for Medicare, routine testing is allowed once every 2 years. The testing frequency can be increased to one year for patients who have rapidly progressing disease, or for those who are receiving medical therapy to restore bone mass. I have reviewed this report, and agree with the above findings. Mark A. Thornton Papas, M.D. [facility] Electronically Signed   By: Lavonia Dana M.D.   On: 08/08/2020 09:43   DG C-Arm  1-60 Min  Result Date: 08/15/2020 CLINICAL DATA:  Surgery, elective. Additional history provided: L2 kyphoplasty. Provided fluoroscopy time 1 minutes, 20 seconds. EXAM: LUMBAR SPINE - 2-3 VIEW; DG C-ARM 1-60 MIN COMPARISON:  Lumbar spine MRI 07/06/2020. FINDINGS: PA and lateral view intraoperative fluoroscopic images of the lumbar spine are submitted, two images total. The images demonstrate kyphoplasty material within the L2 vertebral body at site of a known compression fracture. IMPRESSION: Two intraoperative fluoroscopic images from L2 kyphoplasty, as described. 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 On Eligard [urology/ q67m;last 03/27/2020] + Zytiga+prednsione. AUG  2021-0.32 trending down; STABLE.   #Hold Zytiga plus prednisone at this time-see regarding A. Fib/RVR. Patient has poor tolerance I think is reasonable to hold off Zytiga plus prednisone. Will discuss further.  #A. fib with RVR-systolic blood pressures 76E; heart rate 140s to 90s. Stat EKG ordered reviewed. Discussed with cardiology, Dr.Parashoes as patient is asymptomatic-repeat blood pressures in the 90s okay to go home. However need to contact cardiology ASAP. Patient and daughter verbalized. Continue Eliquis.  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 HOLD recast today given A. fib RVR.  # DISPOSITION: # HOLD Reclast today.  # follow up in 6 weeks- -MD; Labs-1-2 day prior- cbc/cmp/PSA; RECLAST -Dr.B   All questions were answered. The patient knows to  call the clinic with any problems, questions or concerns.    Cammie Sickle, MD 08/17/2020 6:27 AM

## 2020-08-13 ENCOUNTER — Other Ambulatory Visit: Payer: Self-pay | Admitting: Neurology

## 2020-08-14 ENCOUNTER — Other Ambulatory Visit: Payer: Self-pay

## 2020-08-14 ENCOUNTER — Other Ambulatory Visit
Admission: RE | Admit: 2020-08-14 | Discharge: 2020-08-14 | Disposition: A | Discharge status: Home / Self Care | Payer: Medicare Other | Source: Ambulatory Visit | Attending: Orthopedic Surgery | Admitting: Orthopedic Surgery

## 2020-08-14 DIAGNOSIS — Z01812 Encounter for preprocedural laboratory examination: Secondary | ICD-10-CM | POA: Insufficient documentation

## 2020-08-14 DIAGNOSIS — Z20822 Contact with and (suspected) exposure to covid-19: Secondary | ICD-10-CM | POA: Insufficient documentation

## 2020-08-14 MED FILL — ABIRATERONE ACETATE 250 MG: 250 | 30 days supply | Qty: 120 | Fill #4

## 2020-08-14 NOTE — Telephone Encounter (Signed)
Left detailed message with Dr Doristine Devoid recommendations. Advised a call back with any questions or concerns.   30 day supply sent to the pharmacy per Dr Tat.

## 2020-08-14 NOTE — Telephone Encounter (Signed)
Patient had vv scheduled for 08/15/20 but canceled appt; last rx given to last until tomorrows appt but pt cx that.  Please call the patient and let him know that he cannot go 1 year between visits, as it is just not good for his health and he will need to be seen.  You may give a 30 day supply and put on cx list

## 2020-08-15 ENCOUNTER — Telehealth: Payer: Medicare Other | Admitting: Neurology

## 2020-08-15 ENCOUNTER — Ambulatory Visit
Admission: RE | Admit: 2020-08-15 | Discharge: 2020-08-15 | Disposition: A | Discharge status: Home / Self Care | Payer: Medicare Other | Attending: Orthopedic Surgery | Admitting: Orthopedic Surgery

## 2020-08-15 ENCOUNTER — Other Ambulatory Visit: Payer: Self-pay

## 2020-08-15 ENCOUNTER — Ambulatory Visit: Payer: Medicare Other

## 2020-08-15 ENCOUNTER — Ambulatory Visit: Payer: Medicare Other | Admitting: Anesthesiology

## 2020-08-15 ENCOUNTER — Encounter: Payer: Self-pay | Admitting: Orthopedic Surgery

## 2020-08-15 ENCOUNTER — Encounter
Admission: RE | Disposition: A | Discharge status: Home / Self Care | Payer: Self-pay | Source: Home / Self Care | Attending: Orthopedic Surgery

## 2020-08-15 DIAGNOSIS — Z7901 Long term (current) use of anticoagulants: Secondary | ICD-10-CM | POA: Diagnosis not present

## 2020-08-15 DIAGNOSIS — G4730 Sleep apnea, unspecified: Secondary | ICD-10-CM | POA: Diagnosis not present

## 2020-08-15 DIAGNOSIS — S32020A Wedge compression fracture of second lumbar vertebra, initial encounter for closed fracture: Secondary | ICD-10-CM | POA: Diagnosis not present

## 2020-08-15 DIAGNOSIS — Z79899 Other long term (current) drug therapy: Secondary | ICD-10-CM | POA: Insufficient documentation

## 2020-08-15 DIAGNOSIS — X58XXXA Exposure to other specified factors, initial encounter: Secondary | ICD-10-CM | POA: Diagnosis not present

## 2020-08-15 DIAGNOSIS — M8088XA Other osteoporosis with current pathological fracture, vertebra(e), initial encounter for fracture: Secondary | ICD-10-CM | POA: Diagnosis not present

## 2020-08-15 DIAGNOSIS — I4892 Unspecified atrial flutter: Secondary | ICD-10-CM | POA: Insufficient documentation

## 2020-08-15 DIAGNOSIS — M4326 Fusion of spine, lumbar region: Secondary | ICD-10-CM | POA: Diagnosis not present

## 2020-08-15 DIAGNOSIS — Z87891 Personal history of nicotine dependence: Secondary | ICD-10-CM | POA: Insufficient documentation

## 2020-08-15 DIAGNOSIS — Z8546 Personal history of malignant neoplasm of prostate: Secondary | ICD-10-CM | POA: Diagnosis not present

## 2020-08-15 DIAGNOSIS — Z419 Encounter for procedure for purposes other than remedying health state, unspecified: Secondary | ICD-10-CM

## 2020-08-15 HISTORY — PX: KYPHOPLASTY: SHX5884

## 2020-08-15 LAB — SARS CORONAVIRUS 2 (TAT 6-24 HRS): SARS Coronavirus 2: NEGATIVE

## 2020-08-15 SURGERY — KYPHOPLASTY
Anesthesia: General

## 2020-08-15 MED ORDER — METOCLOPRAMIDE HCL 5 MG/ML IJ SOLN
5.0000 mg | Freq: Three times a day (TID) | INTRAMUSCULAR | Status: DC | PRN
  Administered 2020-08-15: 10 mg via INTRAVENOUS

## 2020-08-15 MED ORDER — CEFAZOLIN SODIUM-DEXTROSE 1-4 GM/50ML-% IV SOLN
1.0000 g | Freq: Once | INTRAVENOUS | Status: AC
  Administered 2020-08-15: 1 g via INTRAVENOUS

## 2020-08-15 MED ORDER — ONDANSETRON HCL 4 MG/2ML IJ SOLN: 4.0000 mg | Freq: Once | INTRAMUSCULAR | Status: DC | PRN

## 2020-08-15 MED ORDER — HYDROCODONE-ACETAMINOPHEN 5-325 MG PO TABS
ORAL_TABLET | ORAL | Status: AC
  Administered 2020-08-15: 1 via ORAL
  Filled 2020-08-15: qty 1

## 2020-08-15 MED ORDER — FENTANYL CITRATE (PF) 100 MCG/2ML IJ SOLN
25.0000 ug | INTRAMUSCULAR | Status: DC | PRN
  Administered 2020-08-15 (×3): 25 ug via INTRAVENOUS

## 2020-08-15 MED ORDER — PROPOFOL 10 MG/ML IV BOLUS
INTRAVENOUS | Status: DC | PRN
  Administered 2020-08-15: 20 mg via INTRAVENOUS

## 2020-08-15 MED ORDER — CHLORHEXIDINE GLUCONATE 0.12 % MT SOLN
OROMUCOSAL | Status: AC
  Administered 2020-08-15: 15 mL via OROMUCOSAL
  Filled 2020-08-15: qty 15

## 2020-08-15 MED ORDER — SODIUM CHLORIDE 0.9 % IV SOLN: INTRAVENOUS | Status: DC

## 2020-08-15 MED ORDER — DEXMEDETOMIDINE (PRECEDEX) IN NS 20 MCG/5ML (4 MCG/ML) IV SYRINGE
PREFILLED_SYRINGE | INTRAVENOUS | Status: AC
  Filled 2020-08-15: qty 5

## 2020-08-15 MED ORDER — LACTATED RINGERS IV SOLN: INTRAVENOUS | Status: DC

## 2020-08-15 MED ORDER — KETAMINE HCL 50 MG/ML IJ SOLN
INTRAMUSCULAR | Status: DC | PRN
  Administered 2020-08-15 (×2): 10 mg via INTRAMUSCULAR

## 2020-08-15 MED ORDER — ONDANSETRON HCL 4 MG PO TABS: 4.0000 mg | ORAL_TABLET | Freq: Four times a day (QID) | ORAL | Status: DC | PRN

## 2020-08-15 MED ORDER — BUPIVACAINE-EPINEPHRINE (PF) 0.5% -1:200000 IJ SOLN
INTRAMUSCULAR | Status: AC
  Filled 2020-08-15: qty 30

## 2020-08-15 MED ORDER — PROPOFOL 500 MG/50ML IV EMUL
INTRAVENOUS | Status: AC
  Filled 2020-08-15: qty 50

## 2020-08-15 MED ORDER — HYDROCODONE-ACETAMINOPHEN 5-325 MG PO TABS: 1.0000 | ORAL_TABLET | Freq: Once | ORAL | Status: AC

## 2020-08-15 MED ORDER — LIDOCAINE HCL (PF) 1 % IJ SOLN
INTRAMUSCULAR | Status: AC
  Filled 2020-08-15: qty 60

## 2020-08-15 MED ORDER — HYDROCODONE-ACETAMINOPHEN 5-325 MG PO TABS: 1.0000 | ORAL_TABLET | Freq: Four times a day (QID) | ORAL | 0 refills | Status: DC | PRN

## 2020-08-15 MED ORDER — DEXMEDETOMIDINE HCL 200 MCG/2ML IV SOLN
INTRAVENOUS | Status: DC | PRN
  Administered 2020-08-15: 12 ug via INTRAVENOUS
  Administered 2020-08-15: 8 ug via INTRAVENOUS

## 2020-08-15 MED ORDER — METOCLOPRAMIDE HCL 5 MG/ML IJ SOLN
INTRAMUSCULAR | Status: AC
  Filled 2020-08-15: qty 2

## 2020-08-15 MED ORDER — LIDOCAINE HCL (PF) 2 % IJ SOLN
INTRAMUSCULAR | Status: AC
  Filled 2020-08-15: qty 5

## 2020-08-15 MED ORDER — PROPOFOL 500 MG/50ML IV EMUL
INTRAVENOUS | Status: DC | PRN
  Administered 2020-08-15: 50 ug/kg/min via INTRAVENOUS

## 2020-08-15 MED ORDER — FENTANYL CITRATE (PF) 100 MCG/2ML IJ SOLN
INTRAMUSCULAR | Status: AC
  Administered 2020-08-15: 25 ug via INTRAVENOUS
  Filled 2020-08-15: qty 2

## 2020-08-15 MED ORDER — CEFAZOLIN SODIUM-DEXTROSE 1-4 GM/50ML-% IV SOLN
INTRAVENOUS | Status: AC
  Filled 2020-08-15: qty 50

## 2020-08-15 MED ORDER — LIDOCAINE HCL (CARDIAC) PF 100 MG/5ML IV SOSY
PREFILLED_SYRINGE | INTRAVENOUS | Status: DC | PRN
  Administered 2020-08-15: 40 mg via INTRAVENOUS

## 2020-08-15 MED ORDER — ONDANSETRON HCL 4 MG/2ML IJ SOLN: 4.0000 mg | Freq: Four times a day (QID) | INTRAMUSCULAR | Status: DC | PRN

## 2020-08-15 MED ORDER — CHLORHEXIDINE GLUCONATE 0.12 % MT SOLN: 15.0000 mL | Freq: Once | OROMUCOSAL | Status: AC

## 2020-08-15 MED ORDER — LIDOCAINE HCL 1 % IJ SOLN
INTRAMUSCULAR | Status: DC | PRN
  Administered 2020-08-15: 10 mL

## 2020-08-15 MED ORDER — METOCLOPRAMIDE HCL 10 MG PO TABS: 5.0000 mg | ORAL_TABLET | Freq: Three times a day (TID) | ORAL | Status: DC | PRN

## 2020-08-15 MED ORDER — ORAL CARE MOUTH RINSE: 15.0000 mL | Freq: Once | OROMUCOSAL | Status: AC

## 2020-08-15 SURGICAL SUPPLY — 21 items
ADH SKN CLS APL DERMABOND .7 (GAUZE/BANDAGES/DRESSINGS) ×1
COVER WAND RF STERILE (DRAPES) ×3 IMPLANT
DERMABOND ADVANCED (GAUZE/BANDAGES/DRESSINGS) ×2
DERMABOND ADVANCED .7 DNX12 (GAUZE/BANDAGES/DRESSINGS) ×1 IMPLANT
DEVICE BIOPSY BONE KYPHX (INSTRUMENTS) ×3 IMPLANT
DRAPE C-ARM XRAY 36X54 (DRAPES) ×3 IMPLANT
DURAPREP 26ML APPLICATOR (WOUND CARE) ×3 IMPLANT
GLOVE SURG SYN 9.0  PF PI (GLOVE) ×3
GLOVE SURG SYN 9.0 PF PI (GLOVE) ×1 IMPLANT
GOWN SRG 2XL LVL 4 RGLN SLV (GOWNS) ×1 IMPLANT
GOWN STRL NON-REIN 2XL LVL4 (GOWNS) ×3
GOWN STRL REUS W/ TWL LRG LVL3 (GOWN DISPOSABLE) ×1 IMPLANT
GOWN STRL REUS W/TWL LRG LVL3 (GOWN DISPOSABLE) ×3
MANIFOLD NEPTUNE II (INSTRUMENTS) ×3 IMPLANT
PACK KYPHOPLASTY (MISCELLANEOUS) ×3 IMPLANT
RENTAL RFA  GENERATOR (MISCELLANEOUS)
RENTAL RFA GENERATOR (MISCELLANEOUS) IMPLANT
STRAP SAFETY 5IN WIDE (MISCELLANEOUS) ×3 IMPLANT
SWABSTK COMLB BENZOIN TINCTURE (MISCELLANEOUS) ×3 IMPLANT
TRAY KYPHOPAK 15/3 EXPRESS 1ST (MISCELLANEOUS) IMPLANT
TRAY KYPHOPAK 20/3 EXPRESS 1ST (MISCELLANEOUS) ×3 IMPLANT

## 2020-08-15 NOTE — Discharge Instructions (Addendum)
Take it easy today and tomorrow then try to resume normal activities other than lifting. Walk is much as you can. Remove Band-Aids on Saturday then okay to shower Pain medicine as directed Call office if you are having any problems.   AMBULATORY SURGERY  DISCHARGE INSTRUCTIONS   1) The drugs that you were given will stay in your system until tomorrow so for the next 24 hours you should not:  A) Drive an automobile B) Make any legal decisions C) Drink any alcoholic beverage   2) You may resume regular meals tomorrow.  Today it is better to start with liquids and gradually work up to solid foods.  You may eat anything you prefer, but it is better to start with liquids, then soup and crackers, and gradually work up to solid foods.   3) Please notify your doctor immediately if you have any unusual bleeding, trouble breathing, redness and pain at the surgery site, drainage, fever, or pain not relieved by medication.    4) Additional Instructions:        Please contact your physician with any problems or Same Day Surgery at [phone], Monday through Friday 6 am to 4 pm, or [place] at [facility] number at [phone].

## 2020-08-15 NOTE — Progress Notes (Signed)
[facility] Perioperative Services: Pre-Admission/Anesthesia Testing     Date: 08/15/20  Name: [name] [name] MRN:   [id number]  Re: Plans/clearance for surgery   Case: [id number] Date/Time: 08/15/20 1524   Procedure: L2 compression fracture (N/A )   Anesthesia type: Choice   Pre-op diagnosis: Closed wedge compression fracture of L2 vertebra, initial encounter S32.020A   Location: [place] [facility] / [place] ORS FOR ANESTHESIA GROUP   Surgeons: [name] Knows, MD    Is scheduled to undergo the above procedure today (08/15/2020) with Dr. Hessie [name].  Of note, this procedure was originally scheduled for 07/16/2020, however on the day of surgery patient presented with an elevated heart rate documented to be in the 140s.  ECG revealed atrial fibrillation with RVR.  Cardiology was consulted.  PMH positive for atrial flutter for which patient already taking diltiazem CD 120 mg daily.  Recommendations were for intravenous bolus dose of diltiazem with plans to proceed if this intervention converted patient back to a sinus rhythm.  Diltiazem did not convert patient.  Procedure was subsequently canceled and patient was discharged home to follow-up with outpatient cardiology.   Patient was seen in outpatient consult on 07/17/2020 by Dr. Isaias [name]; notes reviewed.  At the time of this visit, patient doing well overall.  He denied chest pain, shortness of breath, PND, orthopnea, palpitations, peripheral edema, vertiginous symptoms, and presyncope/syncope.  Patient reported that he was not active due to back pain.  Blood pressure in the office 112/70 with a heart rate of 72 bpm.  PMH again positive for paroxysmal atrial flutter for which patient is on rate control (diltiazem CD 120 mg) and chronic anticoagulation (apixaban).  Patient was scheduled for a 72-hour event monitor study with plans to follow-up with cardiology to review results and recommendations.   Patient seen in  follow-up consult on 07/31/2020 by Dr. Saralyn [name]; notes reviewed.  Results from event monitor study discussed with patient.  Study revealed a predominant sinus rhythm with a mean heart rate of 79 bpm.  A. fib with RVR was noted with the longest episode lasting 1 day and 58 minutes with a mean heart rate of 106 bpm; frequent PACs were present.  Patient reported to be completely asymptomatic throughout the duration of his event monitor study; unaware when he is in atrial fibrillation.  Blood pressure in the office that day noted to be 100/64 with a heart rate of 63 bpm.  Given the fact the patient is completely asymptomatic, cardiologist notes reluctance to up titrate AV nodal blocking agent, recommend the addition of antiarrhythmics, or recommend catheter ablation at this time.  Per cardiology, "patient may proceed with kyphoplasty as planned".  He is scheduled to follow-up with outpatient cardiology in 3 months or sooner if needed.   Patient also being followed in the [facility] cancer center by Dr. Charlaine [name].  Patient was last seen on 08/12/2020 at which time he was found to be tachycardic to 162 bpm and hypotensive with a documented blood pressure of 84/68.  Exam revealed that patient was diaphoretic.  ECG performed revealing A. fib with RVR at rate of 122 bpm; RBBB, and evidence of an age undetermined septal infarct.  ECG suggesting that inverted T waves have replaced previously documented nonspecific T waves in the inferior leads. Oncologist reached out to cardiologist to discuss.  Patient was contacted by cardiology and offered an appointment, however patient reported that he was "doing fine" and had rather wait till next week.  In review of the available medical records, it appears as if patient was seen on 08/02/2020 at [facility] urgent care and diagnosed with LEFT maxillary sinusitis.  Blood pressure 98/60 and heart rate 57 bpm.  Patient was prescribed a 7-day course of  amoxicillin-clavulanate.   Patient was seen in follow-up consult on 08/14/2020 by orthopedics.  Notes indicate that patient complained of persistent severe and debilitating back pain secondary to known L2 compression fracture.  Patient has been seeing physiatry and has had 2 spinal injections that have resulted in no relief of his symptoms.  Pain noted to be interfering with patient's QOL and ADLs.  Blood pressure in the office documented at 114/70.  Recommendations were to proceed with previously scheduled kyphoplasty on 08/15/2020.  Patient discussed with attending anesthesiologist [name] Memos, MD) on 08/14/2020 by [place] staff.  ECG reviewed and recommendations were for cardiology input prior to proceeding.  Discussed case and recent events as outlined above with primary attending cardiologist [name] Pilar, MD).  ECG was reviewed by MD who advised that ECG appeared grossly unchanged, and that "atrial fibrillation rate reasonably controlled for patient and constant pain".  Cardiology advising that there are no further interventions required at this time that would further optimize patient for procedure. Recommendations were to proceed with planned kyphoplasty today.  Call placed to [facility] staff to make them aware; spoke with [name] [name], RN. Provider on call list checked. Dr. Saralyn [name] is on call today should further input/consult be deemed necessary by surgical/anesthesia team.   [name] Loh, MSN, APRN, FNP-C, [place] Nurse Practitioner Phone: [phone] 08/15/20 9:18 AM

## 2020-08-15 NOTE — Anesthesia Preprocedure Evaluation (Addendum)
Anesthesia Evaluation  Patient identified by MRN, date of birth, ID band Patient awake    Reviewed: Allergy & Precautions, NPO status , Patient's Chart, lab work & pertinent test results  History of Anesthesia Complications Negative for: history of anesthetic complications  Airway Mallampati: II       Dental   Pulmonary sleep apnea , neg COPD, Not current smoker, former smoker,           Cardiovascular (-) hypertension+CHF  (-) Past MI + dysrhythmias Atrial Fibrillation (-) Valvular Problems/Murmurs     Neuro/Psych neg Seizures    GI/Hepatic Neg liver ROS, neg GERD  ,  Endo/Other  neg diabetes  Renal/GU negative Renal ROS     Musculoskeletal   Abdominal   Peds  Hematology Prostate cancer   Anesthesia Other Findings   Reproductive/Obstetrics                            Anesthesia Physical Anesthesia Plan  ASA: III  Anesthesia Plan: General   Post-op Pain Management:    Induction: Intravenous  PONV Risk Score and Plan:   Airway Management Planned: Oral ETT  Additional Equipment:   Intra-op Plan:   Post-operative Plan:   Informed Consent: I have reviewed the patients History and Physical, chart, labs and discussed the procedure including the risks, benefits and alternatives for the proposed anesthesia with the patient or authorized representative who has indicated his/her understanding and acceptance.       Plan Discussed with:   Anesthesia Plan Comments:         Anesthesia Quick Evaluation

## 2020-08-15 NOTE — Op Note (Signed)
08/15/2020  4:11 PM  PATIENT:  [name] [name]   MRN: [id number]   PRE-OPERATIVE DIAGNOSIS:  closed wedge compression fracture of L2   POST-OPERATIVE DIAGNOSIS:  closed wedge compression fracture of L2   PROCEDURE:  Procedure(s): KYPHOPLASTY L2  SURGEON: Laurene Footman, MD   ASSISTANTS: None   ANESTHESIA:   local and MAC   EBL:  No intake/output data recorded.   BLOOD ADMINISTERED:none   DRAINS: none    LOCAL MEDICATIONS USED:  MARCAINE    and XYLOCAINE    SPECIMEN:  L2 vertebral biopsy   DISPOSITION OF SPECIMEN:  Pathology   COUNTS:  YES   TOURNIQUET:  * No tourniquets in log *   IMPLANTS: Bone cement   DICTATION: .Dragon Dictation  patient was brought to the operating room and after adequate anesthesia was obtained the patient was placed prone.  C arm was brought in in good visualization of the affected level obtained on both AP and lateral projections.  After patient identification and timeout procedures were completed, local anesthetic was infiltrated with 10 cc 1% Xylocaine infiltrated subcutaneously.  This is done the area on the each side of the planned approach.  The back was then prepped and draped in the usual sterile manner and repeat timeout procedure carried out.  A spinal needle was brought down to the pedicle on the each side of  L2 and a 50-50 mix of 1% Xylocaine half percent Sensorcaine with epinephrine total of 20 cc injected on each side.  After allowing this to set a small incision was made and the trocar was advanced into the vertebral body in an extrapedicular fashion.  Biopsy was obtained Drilling was carried out balloon inserted with inflation to  2-1/2 cc on the right and 2-1/2 cc on the left.  When the cement was appropriate consistency 4-1/2 cc were injected on the right and 4 cc on the left into the vertebral body without extravasation, good fill superior to inferior endplates and from right to left sides along the inferior endplate.  After the cement  had set the trochar was removed and permanent C-arm views obtained.  The wound was closed with Dermabond followed by [place]:  Discharged home after recovery   PATIENT DISPOSITION:  PACU - hemodynamically stable.

## 2020-08-15 NOTE — H&P (Signed)
Chief Complaint  Patient presents with  . Back Pain  H & P LOW BACK    History of the Present Illness: [name] [name] is a 79 y.o. male here today for history and physical for L2 kyphoplasty with Dr. Hessie Knows on 08/15/2020. Patient fell back in October 2021 out of the bathtub, injured his lower back. Imaging has a confirmed acute L2 compression fracture. His pain has been persistent, severe and debilitating. Pain is currently 5 out of 10 sitting. His pain is worse with standing and ambulation as well as with sitting from a standing position. He currently sees physiatry, has had 2 recent spinal injections with no relief. Pain is interfering with quality of life and activities day living.  The patient takes Eliquis for atrial flutter.  Of note, the patient is a caregiver of his wife.   I have reviewed past medical, surgical, social and family history, and allergies as documented in the EMR.  Past Medical History: Past Medical History:  Diagnosis Date  . BPH (benign prostatic hyperplasia)  . Diverticulosis 06/05/14  . Parkinson disease (CMS-HCC) 2015  . 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 RBD (REM behavioral disorder) 2012  . Sleep apnea  BiPap   Past Surgical History: Past Surgical History:  Procedure Laterality Date  . APPENDECTOMY 1963  . COLONOSCOPY 10/04/2008  . COLONOSCOPY 06/05/14  REPEAT 5 YEARS PER MUS   Past Family History: Family History  Problem Relation Age of Onset  . Coronary Artery Disease (Blocked arteries around heart) Father  . Rheum arthritis Maternal Grandmother  . Diabetes type II Paternal Grandfather  . Myocardial Infarction (Heart attack) Maternal Grandfather  . Coronary Artery Disease (Blocked arteries around heart) Maternal Grandfather  . Colon cancer Neg Hx  . Colon polyps Neg Hx  . Rectal cancer Neg Hx  . Liver disease Neg Hx  . Ulcers  Neg Hx   Medications: Current Outpatient Medications Ordered in Epic  Medication Sig Dispense Refill  . abiraterone (ZYTIGA) 250 mg tablet Take 1,000 mg by mouth once daily  . carbidopa-levodopa (SINEMET) 25-100 mg tablet TAKE 2 TABLETS THREE TIMES A DAY AROUND MEAL TIMES 540 tablet 0  . clonazePAM (KLONOPIN) 0.5 MG tablet Take 1 tablet (0.5 mg total) by mouth nightly for 180 days 90 tablet 1  . diltiazem (CARDIZEM CD) 120 MG XR capsule Take 1 capsule (120 mg total) by mouth once daily 90 capsule 3  . ELIQUIS 5 mg tablet TAKE 1 TABLET TWICE A DAY 180 tablet 3  . ketoconazole (NIZORAL) 2 % shampoo Apply topically as needed  . methocarbamoL (ROBAXIN) 500 MG tablet 1/2-1 po qHS prn 30 tablet 5  . predniSONE (DELTASONE) 10 MG tablet Take 10 mg by mouth once daily  . tamsulosin (FLOMAX) 0.4 mg capsule Take 0.4 mg by mouth once daily  . tetrahydroz-peg 400-hyprom-gly 0.05-1-0.36-0.2 % Drop Place 2 drops into both eyes as needed for Dry Eyes.  . traMADoL (ULTRAM) 50 mg tablet Take 1 tablet (50 mg total) by mouth 2 (two) times daily as needed for up to 30 days 1 po bid prn 60 tablet 0  . triamcinolone 0.1 % cream Apply topically as directed   No current Epic-ordered facility-administered medications on file.   Allergies: Allergies  Allergen Reactions  . Influenza Virus Vaccine Qv 3295-18(84 Thru 64 Yrs) Anaphylaxis  Flu shot: 1974 anaphylaxis. 2nd time swollen arm  . Others Anaphylaxis  Flu Shot    Body mass index is 22.42 kg/m.  Review of Systems: A comprehensive 14 point ROS was performed, reviewed, and the pertinent orthopaedic findings are documented in the HPI.  Vitals:  08/14/20 1013  BP: 114/70    General Physical Examination:   General:  Well developed, well nourished, no apparent distress, normal affect, antalgic gait.  HEENT: Head normocephalic, atraumatic, PERRL.   Abdomen: Soft, non tender, non distended, Bowel sounds present.  Heart: Examination of the heart  reveals regular, rate, and rhythm. There is no murmur noted on ascultation. There is a normal apical pulse.  Lungs: Lungs are clear to auscultation. There is no wheeze, rhonchi, or crackles. There is normal expansion of bilateral chest walls.  Musculoskeletal Examination:  On examination, tenderness to L2 at the fracture site. No sacral tenderness. No SI joint tenderness. He has no paravertebral muscle spasms. No warmth erythema or drainage. Neuro vas intact in lower extremities.  Radiographs:  MRI of the lumbar spine from 07/06/2020 shows an acute inferior endplate fracture of L2 with 40% compression, as well as some significant spinal stenosis.  Assessment: ICD-10-CM  1. 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 Risks, benefits, complications of a L2 kyphoplasty procedure have been discussed with the patient. Patient has agreed and consented to procedure with Dr. Hessie Knows on 08/15/2020.   Electronically signed by Feliberto Gottron, PA at 08/14/2020 11:54 AM EST  Back to top of Progress Notes  [name], [name], CMA - 08/14/2020 10:15 AM EST Formatting of this note might be different from the original. Review of Systems  Constitutional: Positive for activity change.  HENT: Negative.  Eyes: Negative.  Respiratory: Positive for apnea.  Cardiovascular: Negative.  Gastrointestinal: Negative.  Endocrine: Negative.  Genitourinary: Negative.  Musculoskeletal: Positive for arthralgias, back pain, gait problem and myalgias.  Skin: Negative.  Allergic/Immunologic: Negative.  Hematological: Negative.  Psychiatric/Behavioral: Negative.     Electronically signed by Rush Landmark, CMA at 08/14/2020 11:54 AM EST    Reviewed  H+P. No changes noted.

## 2020-08-15 NOTE — Transfer of Care (Signed)
Immediate Anesthesia Transfer of Care Note  Patient: [name] [name]  Procedure(s) Performed: L2 compression fracture (N/A )  Patient Location: PACU  Anesthesia Type:General  Level of Consciousness: drowsy  Airway & Oxygen Therapy: Patient Spontanous Breathing and Patient connected to face mask oxygen  Post-op Assessment: Report given to RN and Post -op Vital signs reviewed and stable  Post vital signs: Reviewed and stable  Last Vitals:  Vitals Value Taken Time  BP 139/75 08/15/20 1615  Temp    Pulse 78 08/15/20 1616  Resp    SpO2 99 % 08/15/20 1616  Vitals shown include unvalidated device data.  Last Pain:  Vitals:   08/15/20 1329  TempSrc: Tympanic  PainSc: 0-No pain         Complications: No complications documented.

## 2020-08-15 NOTE — Anesthesia Postprocedure Evaluation (Signed)
Anesthesia Post Note  Patient: [name] [name]  Procedure(s) Performed: L2 compression fracture (N/A )  Patient location during evaluation: PACU Anesthesia Type: General Level of consciousness: awake and alert Pain management: pain level controlled Vital Signs Assessment: post-procedure vital signs reviewed and stable Respiratory status: spontaneous breathing and respiratory function stable Cardiovascular status: stable Anesthetic complications: no   No complications documented.   Last Vitals:  Vitals:   08/15/20 1615 08/15/20 1630  BP: 139/75 134/76  Pulse: 80 66  Resp: 18 11  Temp: 37.7 C   SpO2: 100% 95%    Last Pain:  Vitals:   08/15/20 1628  TempSrc:   PainSc: 5                  [name]

## 2020-08-16 ENCOUNTER — Encounter: Payer: Self-pay | Admitting: Orthopedic Surgery

## 2020-08-19 ENCOUNTER — Telehealth: Payer: Self-pay | Admitting: Neurology

## 2020-08-19 LAB — SURGICAL PATHOLOGY

## 2020-08-19 NOTE — Telephone Encounter (Signed)
Patient called and said, "I'm almost out of my carbidopa-levodopa 25-100 MG and need refills. But, I am due for an appointment and not sure Dr. Carles Collet will authorize more refill until my next visit June 2022."  [facility] on [address]

## 2020-08-19 NOTE — Progress Notes (Signed)
Assessment/Plan:   1.  Parkinsons Disease  -Increase carbidopa/levodopa 25/100, 3 tablets at 7 AM/2 at 11 AM/3 PM/2 tablet at 7 PM  -add carbidopa/levodopa 50/200 CR at bed  -refer for home PT   2.  RBD  -Continue clonazepam 0.5 mg at bedtime.  3.  Sleep apnea  -On BiPAP.  Followed by [facility] neurology.  4.  Prostate cancer with metastatic disease  -Followed by oncology.  5.  Atrial flutter  -On Eliquis  -On diltiazem  6.  Low BP  -likely due to combination of diltiazem and Parkinsons Disease   -states that he is following with cardiology and they are aware (following with cardiology in [place])   Subjective:   [name] [name] was seen today in follow up for Parkinsons disease.  My previous records were reviewed prior to todays visit as well as outside records available to me. Daughter supplements hx.  Pt had a fall in October.  He fell out of the bathtub per records but pt states that he was helping wife out of the tub and he fell.  He had an acute L2 compression fracture.  He subsequently had kyphoplasty on December 9.  Notes are reviewed.  Pt with some lightheadedness.  States that he is aware of the low BP and put a zio patch on.    No hallucinations.  Mood has been good.  Current prescribed movement disorder medications: carbidopa/levodopa 25/100, 2 tablets at 7 AM/11 AM/3 PM/1 tablet at 7 PM Opicapone, 50 mg at bedtime (started last visit) - pt states that he d/c it after the samples as it didn't help Clonazepam 0.5 mg at bedtime   PREVIOUS MEDICATIONS: opicapone (took 2 weeks of samples and d/c)  ALLERGIES:   Allergies  Allergen Reactions  . Influenza Vaccines Anaphylaxis    Flu shot: 1974 anaphylaxis. 2nd time swollen arm    CURRENT MEDICATIONS:  Outpatient Encounter Medications as of 08/20/2020  Medication Sig  . apixaban (ELIQUIS) 5 MG TABS tablet Take 1 tablet (5 mg total) by mouth 2 (two) times daily.  . carbidopa-levodopa (SINEMET IR) 25-100 MG  tablet TAKE 2 TABLETS BY MOUTH AT 7 AM, 11AM AND 3 PM AND TAKE 1 TABLET AT 7 PM  . clonazePAM (KLONOPIN) 0.5 MG tablet Take 0.5 mg by mouth at bedtime. Dr Lisabeth Devoid  . diltiazem (CARDIZEM CD) 120 MG 24 hr capsule Take 1 capsule (120 mg total) by mouth daily.  . Glycerin-Hypromellose-PEG 400 (VISINE DRY EYE OP) Place 1 drop into both eyes 2 (two) times daily as needed (eye irritation).   Marland Kitchen ketoconazole (NIZORAL) 2 % shampoo Apply 1 application topically once a week.   . tamsulosin (FLOMAX) 0.4 MG CAPS capsule Take 1 capsule (0.4 mg total) by mouth daily.  Marland Kitchen abiraterone acetate (ZYTIGA) 250 MG tablet TAKE 4 TABLETS (1,000 MG TOTAL) BY MOUTH DAILY. TAKE ON AN EMPTY STOMACH 1 HOUR BEFORE OR 2 HOURS AFTER A MEAL (Patient not taking: Reported on 08/20/2020)  . oxycodone (OXY-IR) 5 MG capsule Take 5 mg by mouth every 6 (six) hours as needed for pain.  . predniSONE (DELTASONE) 10 MG tablet TAKE 1 TABLET BY MOUTH ONCE DAILY WITH BREAKFAST (Patient not taking: Reported on 08/20/2020)  . [DISCONTINUED] HYDROcodone-acetaminophen (NORCO) 5-325 MG tablet Take 1 tablet by mouth every 6 (six) hours as needed for moderate pain. (Patient not taking: Reported on 08/20/2020)  . [DISCONTINUED] traMADol (ULTRAM) 50 MG tablet Take 50 mg by mouth 2 (two) times daily as needed  for pain. (Patient not taking: Reported on 08/20/2020)   No facility-administered encounter medications on file as of 08/20/2020.    Objective:   PHYSICAL EXAMINATION:    VITALS:   Vitals:   08/20/20 0817  BP: (!) 84/62  Pulse: (!) 55  SpO2: 97%  Weight: 157 lb (71.2 kg)  Height: 5\' 9"  (1.753 m)    GEN:  The patient appears stated age and is in NAD. HEENT:  Normocephalic, atraumatic.  The mucous membranes are moist. The superficial temporal arteries are without ropiness or tenderness. CV:  RRR Lungs:  CTAB Neck/HEME:  There are no carotid bruits bilaterally.  Neurological examination:  Orientation: The patient is alert and oriented  x3. Cranial nerves: There is good facial symmetry with facial hypomimia. The speech is fluent and clear. Soft palate rises symmetrically and there is no tongue deviation. Hearing is intact to conversational tone. Sensation: Sensation is intact to light touch throughout Motor: Strength is at least antigravity x4.  Movement examination: Tone: There is mod increased tone in the R>LUE Abnormal movements: none Coordination:  There is  decremation with RAM's, with any form of RAMS, including alternating supination and pronation of the forearm, hand opening and closing, finger taps, heel taps and toe taps, R>L Gait and Station: The patient has difficulty arising out of a deep-seated chair without the use of the hands. The patient's stride length is decreased.  He is slow and short stepped.      I have reviewed and interpreted the following labs independently    Chemistry      Component Value Date/Time   NA 139 08/08/2020 0934   K 4.0 08/08/2020 0934   CL 100 08/08/2020 0934   CO2 27 08/08/2020 0934   BUN 23 08/08/2020 0934   CREATININE 1.52 (H) 08/08/2020 0934      Component Value Date/Time   CALCIUM 10.3 08/08/2020 0934   ALKPHOS 76 08/08/2020 0934   AST 30 08/08/2020 0934   ALT 8 08/08/2020 0934   BILITOT 2.4 (H) 08/08/2020 0934       Lab Results  Component Value Date   WBC 10.7 (H) 08/08/2020   HGB 14.5 08/08/2020   HCT 45.1 08/08/2020   MCV 95.6 08/08/2020   PLT 247 08/08/2020    Lab Results  Component Value Date   TSH 1.217 08/16/2019     Total time spent on today's visit was 45 minutes, including both face-to-face time and nonface-to-face time.  Time included that spent on review of records (prior notes available to me/labs/imaging if pertinent), discussing treatment and goals, answering patient's questions and coordinating care.  Cc:  Juline Patch, MD

## 2020-08-19 NOTE — Telephone Encounter (Signed)
Patient now scheduled for 08/20/20 at 8:45 AM.

## 2020-08-19 NOTE — Telephone Encounter (Signed)
Ok to fill rx per Dr Tat.   Rx was sent over to pharmacy on 08/14/20.   Spoke with pharmacy who states they received the rx but the patients insurance was rejecting the medication. She states she will look into it and get in touch with the patient.

## 2020-08-19 NOTE — Telephone Encounter (Signed)
Is it ok to refill medication or wait until after appt.

## 2020-08-20 ENCOUNTER — Encounter: Payer: Self-pay | Admitting: Neurology

## 2020-08-20 ENCOUNTER — Ambulatory Visit (INDEPENDENT_AMBULATORY_CARE_PROVIDER_SITE_OTHER): Payer: Medicare Other | Admitting: Neurology

## 2020-08-20 ENCOUNTER — Other Ambulatory Visit: Payer: Self-pay

## 2020-08-20 VITALS — BP 84/62 | HR 55 | Ht 69.0 in | Wt 157.0 lb

## 2020-08-20 DIAGNOSIS — G903 Multi-system degeneration of the autonomic nervous system: Secondary | ICD-10-CM | POA: Diagnosis not present

## 2020-08-20 DIAGNOSIS — G20 Parkinson's disease: Secondary | ICD-10-CM

## 2020-08-20 MED ORDER — CARBIDOPA-LEVODOPA ER 50-200 MG PO TBCR: 1.0000 | EXTENDED_RELEASE_TABLET | Freq: Every day | ORAL | 1 refills | Status: DC

## 2020-08-20 MED ORDER — CARBIDOPA-LEVODOPA 25-100 MG PO TABS
ORAL_TABLET | ORAL | 1 refills | Status: DC
Start: 2020-08-20 — End: 2021-02-17

## 2020-08-20 NOTE — Patient Instructions (Addendum)
1.  Take carbidopa/levodopa 25/100, 3 tablets at 7 AM/2 at 11 AM/2 tablets at 3 PM/2 tablet at 7 PM 2.  add carbidopa/levodopa 50/200 CR at bed 3.  We will send PT to the home.  The physicians and staff at [facility] are committed to providing excellent care. You may receive a survey requesting feedback about your experience at our office. We strive to receive "very good" responses to the survey questions. If you feel that your experience would prevent you from giving the office a "very good " response, please contact our office to try to remedy the situation. We may be reached at [phone]. Thank you for taking the time out of your busy day to complete the survey.

## 2020-08-22 ENCOUNTER — Other Ambulatory Visit: Payer: Self-pay | Admitting: Orthopedic Surgery

## 2020-08-22 DIAGNOSIS — Z9889 Other specified postprocedural states: Secondary | ICD-10-CM | POA: Diagnosis not present

## 2020-08-22 DIAGNOSIS — S22080A Wedge compression fracture of T11-T12 vertebra, initial encounter for closed fracture: Secondary | ICD-10-CM

## 2020-08-22 DIAGNOSIS — S22060A Wedge compression fracture of T7-T8 vertebra, initial encounter for closed fracture: Secondary | ICD-10-CM

## 2020-08-24 ENCOUNTER — Ambulatory Visit
Admission: RE | Admit: 2020-08-24 | Discharge: 2020-08-24 | Disposition: A | Discharge status: Home / Self Care | Payer: Medicare Other | Source: Ambulatory Visit | Attending: Orthopedic Surgery | Admitting: Orthopedic Surgery

## 2020-08-24 DIAGNOSIS — S22080A Wedge compression fracture of T11-T12 vertebra, initial encounter for closed fracture: Secondary | ICD-10-CM | POA: Diagnosis not present

## 2020-08-24 DIAGNOSIS — Z743 Need for continuous supervision: Secondary | ICD-10-CM | POA: Diagnosis not present

## 2020-08-24 DIAGNOSIS — C61 Malignant neoplasm of prostate: Secondary | ICD-10-CM | POA: Diagnosis not present

## 2020-08-24 DIAGNOSIS — R2681 Unsteadiness on feet: Secondary | ICD-10-CM | POA: Diagnosis not present

## 2020-08-24 DIAGNOSIS — M5124 Other intervertebral disc displacement, thoracic region: Secondary | ICD-10-CM | POA: Diagnosis not present

## 2020-08-24 DIAGNOSIS — Z9181 History of falling: Secondary | ICD-10-CM | POA: Diagnosis not present

## 2020-08-24 DIAGNOSIS — S22060A Wedge compression fracture of T7-T8 vertebra, initial encounter for closed fracture: Secondary | ICD-10-CM | POA: Insufficient documentation

## 2020-08-24 DIAGNOSIS — M40204 Unspecified kyphosis, thoracic region: Secondary | ICD-10-CM | POA: Diagnosis not present

## 2020-08-24 DIAGNOSIS — G20 Parkinson's disease: Secondary | ICD-10-CM | POA: Diagnosis not present

## 2020-08-24 DIAGNOSIS — Z9889 Other specified postprocedural states: Secondary | ICD-10-CM | POA: Insufficient documentation

## 2020-08-24 DIAGNOSIS — M4804 Spinal stenosis, thoracic region: Secondary | ICD-10-CM | POA: Diagnosis not present

## 2020-08-24 DIAGNOSIS — M6281 Muscle weakness (generalized): Secondary | ICD-10-CM | POA: Diagnosis not present

## 2020-08-27 ENCOUNTER — Telehealth: Payer: Self-pay

## 2020-08-27 NOTE — Telephone Encounter (Signed)
Received call from Lennette Bihari at [facility] seeking verbal orders for patients therapy plan:  Once a week for 7 weeks.  Verbal order given.

## 2020-08-28 DIAGNOSIS — S22060A Wedge compression fracture of T7-T8 vertebra, initial encounter for closed fracture: Secondary | ICD-10-CM | POA: Diagnosis not present

## 2020-08-28 DIAGNOSIS — Z9889 Other specified postprocedural states: Secondary | ICD-10-CM | POA: Diagnosis not present

## 2020-08-29 ENCOUNTER — Ambulatory Visit: Payer: Medicare Other | Admitting: Certified Registered Nurse Anesthetist

## 2020-08-29 ENCOUNTER — Encounter
Admission: RE | Disposition: A | Discharge status: Home / Self Care | Payer: Self-pay | Source: Home / Self Care | Attending: Orthopedic Surgery

## 2020-08-29 ENCOUNTER — Ambulatory Visit
Admission: RE | Admit: 2020-08-29 | Discharge: 2020-08-29 | Disposition: A | Discharge status: Home / Self Care | Payer: Medicare Other | Attending: Orthopedic Surgery | Admitting: Orthopedic Surgery

## 2020-08-29 ENCOUNTER — Other Ambulatory Visit
Admission: RE | Admit: 2020-08-29 | Discharge: 2020-08-29 | Disposition: A | Discharge status: Home / Self Care | Payer: Medicare Other | Source: Ambulatory Visit | Attending: Orthopedic Surgery | Admitting: Orthopedic Surgery

## 2020-08-29 ENCOUNTER — Encounter: Payer: Self-pay | Admitting: Orthopedic Surgery

## 2020-08-29 ENCOUNTER — Other Ambulatory Visit: Payer: Self-pay

## 2020-08-29 ENCOUNTER — Ambulatory Visit: Payer: Medicare Other

## 2020-08-29 DIAGNOSIS — M778 Other enthesopathies, not elsewhere classified: Secondary | ICD-10-CM | POA: Diagnosis not present

## 2020-08-29 DIAGNOSIS — Z419 Encounter for procedure for purposes other than remedying health state, unspecified: Secondary | ICD-10-CM

## 2020-08-29 DIAGNOSIS — Z887 Allergy status to serum and vaccine status: Secondary | ICD-10-CM | POA: Insufficient documentation

## 2020-08-29 DIAGNOSIS — Z7952 Long term (current) use of systemic steroids: Secondary | ICD-10-CM | POA: Diagnosis not present

## 2020-08-29 DIAGNOSIS — M8088XA Other osteoporosis with current pathological fracture, vertebra(e), initial encounter for fracture: Secondary | ICD-10-CM | POA: Insufficient documentation

## 2020-08-29 DIAGNOSIS — Z79899 Other long term (current) drug therapy: Secondary | ICD-10-CM | POA: Insufficient documentation

## 2020-08-29 DIAGNOSIS — Z87892 Personal history of anaphylaxis: Secondary | ICD-10-CM | POA: Insufficient documentation

## 2020-08-29 DIAGNOSIS — Z7901 Long term (current) use of anticoagulants: Secondary | ICD-10-CM | POA: Insufficient documentation

## 2020-08-29 DIAGNOSIS — G4730 Sleep apnea, unspecified: Secondary | ICD-10-CM | POA: Diagnosis not present

## 2020-08-29 DIAGNOSIS — M4324 Fusion of spine, thoracic region: Secondary | ICD-10-CM | POA: Diagnosis not present

## 2020-08-29 DIAGNOSIS — Z20822 Contact with and (suspected) exposure to covid-19: Secondary | ICD-10-CM | POA: Diagnosis not present

## 2020-08-29 DIAGNOSIS — B351 Tinea unguium: Secondary | ICD-10-CM | POA: Diagnosis not present

## 2020-08-29 DIAGNOSIS — S22060A Wedge compression fracture of T7-T8 vertebra, initial encounter for closed fracture: Secondary | ICD-10-CM | POA: Diagnosis not present

## 2020-08-29 DIAGNOSIS — L600 Ingrowing nail: Secondary | ICD-10-CM | POA: Diagnosis not present

## 2020-08-29 DIAGNOSIS — M4854XA Collapsed vertebra, not elsewhere classified, thoracic region, initial encounter for fracture: Secondary | ICD-10-CM | POA: Diagnosis not present

## 2020-08-29 DIAGNOSIS — I4892 Unspecified atrial flutter: Secondary | ICD-10-CM | POA: Diagnosis not present

## 2020-08-29 LAB — SARS CORONAVIRUS 2 BY RT PCR (HOSPITAL ORDER, PERFORMED IN ~~LOC~~ HOSPITAL LAB): SARS Coronavirus 2: NEGATIVE

## 2020-08-29 SURGERY — KYPHOPLASTY
Anesthesia: General | Site: Spine Thoracic

## 2020-08-29 MED ORDER — ONDANSETRON HCL 4 MG/2ML IJ SOLN: 4.0000 mg | Freq: Once | INTRAMUSCULAR | Status: DC | PRN

## 2020-08-29 MED ORDER — KETAMINE HCL 100 MG/ML IJ SOLN
INTRAMUSCULAR | Status: DC | PRN
  Administered 2020-08-29: 10 mg via INTRAVENOUS
  Administered 2020-08-29: 20 mg via INTRAVENOUS
  Administered 2020-08-29 (×2): 10 mg via INTRAVENOUS

## 2020-08-29 MED ORDER — ONDANSETRON HCL 4 MG PO TABS: 4.0000 mg | ORAL_TABLET | Freq: Four times a day (QID) | ORAL | Status: DC | PRN

## 2020-08-29 MED ORDER — MEPERIDINE HCL 50 MG/ML IJ SOLN
INTRAMUSCULAR | Status: AC
  Filled 2020-08-29: qty 1

## 2020-08-29 MED ORDER — PROPOFOL 500 MG/50ML IV EMUL
INTRAVENOUS | Status: DC | PRN
  Administered 2020-08-29: 30 ug/kg/min via INTRAVENOUS

## 2020-08-29 MED ORDER — FAMOTIDINE 20 MG PO TABS: 20.0000 mg | ORAL_TABLET | Freq: Once | ORAL | Status: AC

## 2020-08-29 MED ORDER — OXYCODONE HCL 5 MG PO TABS: 5.0000 mg | ORAL_TABLET | Freq: Once | ORAL | Status: AC

## 2020-08-29 MED ORDER — OXYCODONE HCL 5 MG PO CAPS: 5.0000 mg | ORAL_CAPSULE | Freq: Four times a day (QID) | ORAL | 0 refills | Status: DC | PRN

## 2020-08-29 MED ORDER — CEFAZOLIN SODIUM-DEXTROSE 1-4 GM/50ML-% IV SOLN
1.0000 g | INTRAVENOUS | Status: AC
  Administered 2020-08-29: 14:00:00 1 g via INTRAVENOUS

## 2020-08-29 MED ORDER — CHLORHEXIDINE GLUCONATE 0.12 % MT SOLN: 15.0000 mL | Freq: Once | OROMUCOSAL | Status: AC

## 2020-08-29 MED ORDER — LIDOCAINE HCL (PF) 1 % IJ SOLN
INTRAMUSCULAR | Status: AC
  Filled 2020-08-29: qty 60

## 2020-08-29 MED ORDER — CHLORHEXIDINE GLUCONATE 0.12 % MT SOLN
OROMUCOSAL | Status: AC
  Filled 2020-08-29: qty 15

## 2020-08-29 MED ORDER — FENTANYL CITRATE (PF) 100 MCG/2ML IJ SOLN
25.0000 ug | INTRAMUSCULAR | Status: DC | PRN
Start: 2020-08-29 — End: 2020-08-31

## 2020-08-29 MED ORDER — LIDOCAINE HCL 1 % IJ SOLN
INTRAMUSCULAR | Status: DC | PRN
  Administered 2020-08-29: 30 mL

## 2020-08-29 MED ORDER — PROPOFOL 10 MG/ML IV BOLUS
INTRAVENOUS | Status: DC | PRN
  Administered 2020-08-29: 10 mg via INTRAVENOUS

## 2020-08-29 MED ORDER — METOCLOPRAMIDE HCL 5 MG/ML IJ SOLN: 5.0000 mg | Freq: Three times a day (TID) | INTRAMUSCULAR | Status: DC | PRN

## 2020-08-29 MED ORDER — METOCLOPRAMIDE HCL 10 MG PO TABS: 5.0000 mg | ORAL_TABLET | Freq: Three times a day (TID) | ORAL | Status: DC | PRN

## 2020-08-29 MED ORDER — SODIUM CHLORIDE 0.9 % IV SOLN: INTRAVENOUS | Status: DC

## 2020-08-29 MED ORDER — ONDANSETRON HCL 4 MG/2ML IJ SOLN
INTRAMUSCULAR | Status: DC | PRN
  Administered 2020-08-29: 4 mg via INTRAVENOUS

## 2020-08-29 MED ORDER — ONDANSETRON HCL 4 MG/2ML IJ SOLN: 4.0000 mg | Freq: Four times a day (QID) | INTRAMUSCULAR | Status: DC | PRN

## 2020-08-29 MED ORDER — FAMOTIDINE 20 MG PO TABS
ORAL_TABLET | ORAL | Status: AC
  Administered 2020-08-29: 14:00:00 20 mg via ORAL
  Filled 2020-08-29: qty 1

## 2020-08-29 MED ORDER — MEPERIDINE HCL 25 MG/ML IJ SOLN
25.0000 mg | Freq: Once | INTRAMUSCULAR | Status: AC
  Administered 2020-08-29: 14:00:00 25 mg via INTRAVENOUS
  Filled 2020-08-29: qty 1

## 2020-08-29 MED ORDER — CEFAZOLIN SODIUM-DEXTROSE 1-4 GM/50ML-% IV SOLN
INTRAVENOUS | Status: AC
  Filled 2020-08-29: qty 50

## 2020-08-29 MED ORDER — IPRATROPIUM-ALBUTEROL 0.5-2.5 (3) MG/3ML IN SOLN: 3.0000 mL | Freq: Once | RESPIRATORY_TRACT | Status: AC

## 2020-08-29 MED ORDER — ORAL CARE MOUTH RINSE
15.0000 mL | Freq: Once | OROMUCOSAL | Status: AC
  Administered 2020-08-29: 14:00:00 15 mL via OROMUCOSAL

## 2020-08-29 MED ORDER — BUPIVACAINE-EPINEPHRINE (PF) 0.5% -1:200000 IJ SOLN
INTRAMUSCULAR | Status: AC
  Filled 2020-08-29: qty 30

## 2020-08-29 MED ORDER — LACTATED RINGERS IV SOLN: INTRAVENOUS | Status: DC

## 2020-08-29 MED ORDER — BUPIVACAINE-EPINEPHRINE (PF) 0.5% -1:200000 IJ SOLN
INTRAMUSCULAR | Status: DC | PRN
  Administered 2020-08-29: 20 mL

## 2020-08-29 MED ORDER — IPRATROPIUM-ALBUTEROL 0.5-2.5 (3) MG/3ML IN SOLN
RESPIRATORY_TRACT | Status: AC
  Administered 2020-08-29: 16:00:00 3 mL via RESPIRATORY_TRACT
  Filled 2020-08-29: qty 3

## 2020-08-29 MED ORDER — OXYCODONE HCL 5 MG PO TABS
ORAL_TABLET | ORAL | Status: AC
  Administered 2020-08-29: 17:00:00 5 mg via ORAL
  Filled 2020-08-29: qty 1

## 2020-08-29 MED ORDER — FENTANYL CITRATE (PF) 100 MCG/2ML IJ SOLN
INTRAMUSCULAR | Status: DC | PRN
  Administered 2020-08-29 (×4): 25 ug via INTRAVENOUS

## 2020-08-29 MED ORDER — FENTANYL CITRATE (PF) 100 MCG/2ML IJ SOLN
INTRAMUSCULAR | Status: AC
  Filled 2020-08-29: qty 2

## 2020-08-29 SURGICAL SUPPLY — 23 items
ADH SKN CLS APL DERMABOND .7 (GAUZE/BANDAGES/DRESSINGS) ×1
CEMENT KYPHON CX01A KIT/MIXER (Cement) ×3 IMPLANT
COVER WAND RF STERILE (DRAPES) ×3 IMPLANT
DERMABOND ADVANCED (GAUZE/BANDAGES/DRESSINGS) ×2
DERMABOND ADVANCED .7 DNX12 (GAUZE/BANDAGES/DRESSINGS) ×1 IMPLANT
DEVICE BIOPSY BONE KYPHX (INSTRUMENTS) ×3 IMPLANT
DRAPE C-ARM XRAY 36X54 (DRAPES) ×3 IMPLANT
DURAPREP 26ML APPLICATOR (WOUND CARE) ×3 IMPLANT
FEE RENTAL RFA GENERATOR (MISCELLANEOUS) IMPLANT
GLOVE SURG SYN 9.0  PF PI (GLOVE) ×3
GLOVE SURG SYN 9.0 PF PI (GLOVE) ×1 IMPLANT
GOWN SRG 2XL LVL 4 RGLN SLV (GOWNS) ×1 IMPLANT
GOWN STRL NON-REIN 2XL LVL4 (GOWNS) ×3
GOWN STRL REUS W/ TWL LRG LVL3 (GOWN DISPOSABLE) ×1 IMPLANT
GOWN STRL REUS W/TWL LRG LVL3 (GOWN DISPOSABLE) ×3
MANIFOLD NEPTUNE II (INSTRUMENTS) ×3 IMPLANT
PACK KYPHOPLASTY (MISCELLANEOUS) ×3 IMPLANT
RENTAL RFA  GENERATOR (MISCELLANEOUS)
RENTAL RFA GENERATOR (MISCELLANEOUS) IMPLANT
STRAP SAFETY 5IN WIDE (MISCELLANEOUS) ×3 IMPLANT
SWABSTK COMLB BENZOIN TINCTURE (MISCELLANEOUS) ×3 IMPLANT
TRAY KYPHOPAK 15/3 EXPRESS 1ST (MISCELLANEOUS) ×3 IMPLANT
TRAY KYPHOPAK 20/3 EXPRESS 1ST (MISCELLANEOUS) ×1 IMPLANT

## 2020-08-29 NOTE — Anesthesia Preprocedure Evaluation (Addendum)
Anesthesia Evaluation  Patient identified by MRN, date of birth, ID band Patient awake    Reviewed: Allergy & Precautions, NPO status , Patient's Chart, lab work & pertinent test results  History of Anesthesia Complications Negative for: history of anesthetic complications  Airway Mallampati: II       Dental   Pulmonary sleep apnea , neg COPD, Not current smoker, former smoker,           Cardiovascular (-) hypertension+ Peripheral Vascular Disease and +CHF  (-) Past MI + dysrhythmias Atrial Fibrillation (-) Valvular Problems/Murmurs     Neuro/Psych neg Seizures Parkinson's disease    GI/Hepatic Neg liver ROS, neg GERD  ,  Endo/Other  neg diabetes  Renal/GU negative Renal ROS     Musculoskeletal   Abdominal   Peds  Hematology Prostate cancer   Anesthesia Other Findings   Reproductive/Obstetrics                             Anesthesia Physical  Anesthesia Plan  ASA: III  Anesthesia Plan: General   Post-op Pain Management:    Induction: Intravenous  PONV Risk Score and Plan: TIVA  Airway Management Planned: Nasal Cannula  Additional Equipment:   Intra-op Plan:   Post-operative Plan:   Informed Consent: I have reviewed the patients History and Physical, chart, labs and discussed the procedure including the risks, benefits and alternatives for the proposed anesthesia with the patient or authorized representative who has indicated his/her understanding and acceptance.       Plan Discussed with:   Anesthesia Plan Comments:         Anesthesia Quick Evaluation

## 2020-08-29 NOTE — Anesthesia Procedure Notes (Signed)
Procedure Name: MAC Date/Time: 08/29/2020 2:00 PM Performed by: Lily Peer, Becci Batty, CRNA Pre-anesthesia Checklist: Patient identified, Suction available, Emergency Drugs available and Patient being monitored Oxygen Delivery Method: Nasal cannula Induction Type: IV induction

## 2020-08-29 NOTE — Discharge Instructions (Addendum)
Take it easy today and try to start walking as much as you can starting tomorrow. Pain medicine as directed. Remove Band-Aids on Saturday then okay to shower. Avoid bending or lifting over 5 pounds for the next 2 weeks.  AMBULATORY SURGERY  DISCHARGE INSTRUCTIONS   1) The drugs that you were given will stay in your system until tomorrow so for the next 24 hours you should not:  A) Drive an automobile B) Make any legal decisions C) Drink any alcoholic beverage   2) You may resume regular meals tomorrow.  Today it is better to start with liquids and gradually work up to solid foods.  You may eat anything you prefer, but it is better to start with liquids, then soup and crackers, and gradually work up to solid foods.   3) Please notify your doctor immediately if you have any unusual bleeding, trouble breathing, redness and pain at the surgery site, drainage, fever, or pain not relieved by medication.    4) Additional Instructions:        Please contact your physician with any problems or Same Day Surgery at [phone], Monday through Friday 6 am to 4 pm, or [place] at [facility] number at [phone].

## 2020-08-29 NOTE — OR Nursing (Signed)
OK for pt to resume his Eliquis tonight, per MD Rudene Christians. Continue to monitor.

## 2020-08-29 NOTE — Op Note (Signed)
08/29/2020  2:45 PM  PATIENT:  [name] [name]   MRN: [id number]   PRE-OPERATIVE DIAGNOSIS:  closed wedge compression fracture of T8   POST-OPERATIVE DIAGNOSIS:  closed wedge compression fracture of T8   PROCEDURE:  Procedure(s): KYPHOPLASTY T8  SURGEON: Laurene Footman, MD   ASSISTANTS: None   ANESTHESIA:   local and MAC   EBL:  No intake/output data recorded.   BLOOD ADMINISTERED:none   DRAINS: none    LOCAL MEDICATIONS USED:  MARCAINE    and XYLOCAINE    SPECIMEN:   T8 vertebral body biopsy   DISPOSITION OF SPECIMEN:  Pathology   COUNTS:  YES   TOURNIQUET:  * No tourniquets in log *   IMPLANTS: Bone cement   DICTATION: .Dragon Dictation  patient was brought to the operating room and after adequate anesthesia was obtained the patient was placed prone.  C arm was brought in in good visualization of the affected level obtained on both AP and lateral projections.  After patient identification and timeout procedures were completed, local anesthetic was infiltrated with 10 cc 1% Xylocaine infiltrated subcutaneously.  This is done the area on the each side of the planned approach.  The back was then prepped and draped in the usual sterile manner and repeat timeout procedure carried out.  A spinal needle was brought down to the pedicle on the each side of  T8 and a 50-50 mix of 1% Xylocaine half percent Sensorcaine with epinephrine total of 20 cc injected on each side.  After allowing this to set a small incision was made and the trocar was advanced into the vertebral body in an extrapedicular fashion.  Biopsy was obtained Drilling was carried out balloon inserted with inflation to  1-1/2 cc on the right and 1-1/2 cc on the left.  When the cement was appropriate consistency to cc were injected on the right and 2-1/2 cc on the left into the vertebral body with a small amount of extravasation anteriorly and superiorly, good fill superior to inferior endplates and from right to left  sides along the inferior endplate.  After the cement had set the trochar was removed and permanent C-arm views obtained.  The wound was closed with Dermabond followed by Band-Aid   PLAN OF CARE:  Discharge home after recovery room   PATIENT DISPOSITION:  PACU - hemodynamically stable.

## 2020-08-29 NOTE — Transfer of Care (Signed)
Immediate Anesthesia Transfer of Care Note  Patient: GLENN GULLICKSON  Procedure(s) Performed: T8 KYPHOPLASTY (N/A Spine Thoracic)  Patient Location: PACU  Anesthesia Type:General  Level of Consciousness: awake, alert  and oriented  Airway & Oxygen Therapy: Patient Spontanous Breathing and Patient connected to nasal cannula oxygen  Post-op Assessment: Report given to RN and Post -op Vital signs reviewed and stable  Post vital signs: Reviewed and stable  Last Vitals:  Vitals Value Taken Time  BP 125/79 08/29/20 1445  Temp    Pulse 78 08/29/20 1446  Resp 25 08/29/20 1446  SpO2 95 % 08/29/20 1446  Vitals shown include unvalidated device data.  Last Pain:  Vitals:   08/29/20 1306  TempSrc: Oral  PainSc: 8          Complications: No complications documented.

## 2020-08-29 NOTE — H&P (Signed)
Chief Complaint  Patient presents with  . Spine - Post Operative Visit   [name] [name] is a 79 y.o. male who presents today status post L2 kyphoplasty with Dr. Hessie Knows on 08/15/2020. Patient states pain in his lower back is much better but he is now having bilateral rib pain along the mid ribs bilaterally radiating along anteriorly. He denies any burning numbness or tingling. He is also having some mid thoracic back pain. He states the pain is moderate and worse with movement. Overall mobility and pain from previous compression fracture has improved. He is still taking oxycodone every 4 hours. Bowels are moving well. No nausea vomiting or fevers. He states the pain is sharp and severe when present he feels it mostly with bending and any type of movement of the spine. Current pain is limiting his ability to perform activities daily living. He is mostly sitting.  Past Medical History: Past Medical History:  Diagnosis Date  . BPH (benign prostatic hyperplasia)  . Diverticulosis 06/05/14  . Parkinson disease (CMS-HCC) 2015  . 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 RBD (REM behavioral disorder) 2012  . Sleep apnea  BiPap   Past Surgical History: Past Surgical History:  Procedure Laterality Date  . APPENDECTOMY 1963  . COLONOSCOPY 10/04/2008  . COLONOSCOPY 06/05/14  REPEAT 5 YEARS PER MUS  . KYPHOPLASTY N/A 08/15/2020  Dr. Rudene Christians (L2)   Past Family History: Family History  Problem Relation Age of Onset  . Coronary Artery Disease (Blocked arteries around heart) Father  . Rheum arthritis Maternal Grandmother  . Diabetes type II Paternal Grandfather  . Myocardial Infarction (Heart attack) Maternal Grandfather  . Coronary Artery Disease (Blocked arteries around heart) Maternal Grandfather  . Colon cancer Neg Hx  . Colon polyps Neg Hx  . Rectal cancer Neg Hx  . Liver disease Neg Hx   . Ulcers Neg Hx   Medications: Current Outpatient Medications Ordered in Epic  Medication Sig Dispense Refill  . abiraterone (ZYTIGA) 250 mg tablet Take 1,000 mg by mouth once daily  . carbidopa-levodopa (SINEMET CR) 50-200 mg CR tablet Take 1 tablet by mouth nightly  . carbidopa-levodopa (SINEMET) 25-100 mg tablet TAKE 2 TABLETS THREE TIMES A DAY AROUND MEAL TIMES 540 tablet 0  . clonazePAM (KLONOPIN) 0.5 MG tablet Take 1 tablet (0.5 mg total) by mouth nightly for 180 days 90 tablet 1  . diltiazem (CARDIZEM CD) 120 MG XR capsule Take 1 capsule (120 mg total) by mouth once daily 90 capsule 3  . ELIQUIS 5 mg tablet TAKE 1 TABLET TWICE A DAY 180 tablet 3  . HYDROcodone-acetaminophen (NORCO) 5-325 mg tablet  . ketoconazole (NIZORAL) 2 % shampoo Apply topically as needed  . methocarbamoL (ROBAXIN) 500 MG tablet 1/2-1 po qHS prn (Patient not taking: Reported on 08/28/2020 ) 30 tablet 5  . oxyCODONE (ROXICODONE) 5 MG immediate release tablet Take 1 tablet (5 mg total) by mouth every 6 (six) hours as needed for Pain for up to 5 days 30 tablet 0  . predniSONE (DELTASONE) 10 MG tablet Take 10 mg by mouth once daily  . tamsulosin (FLOMAX) 0.4 mg capsule Take 0.4 mg by mouth once daily  . tetrahydroz-peg 400-hyprom-gly 0.05-1-0.36-0.2 % Drop Place 2 drops into both eyes as needed for Dry Eyes.  . triamcinolone 0.1 % cream Apply topically as directed   No current Epic-ordered facility-administered medications on file.   Allergies: Allergies  Allergen Reactions  . Influenza Virus Vaccine Qv 1610-96(04 Thru 64 Yrs) Anaphylaxis  Flu shot: 1974 anaphylaxis. 2nd time swollen arm  . Others Anaphylaxis  Flu Shot    Review of Systems:  A comprehensive 14 point ROS was performed, reviewed by me today, and the pertinent orthopaedic findings are documented in the HPI.  Exam: BP 128/76  Ht 176.5 cm (5' 9.5")  Wt 73.7 kg (162 lb 6.4 oz)  BMI 23.64 kg/m  General:  Well developed, well nourished,  appears to be in discomfort, antalgic gait.  HEENT: Head normocephalic, atraumatic, PERRL.   Abdomen: Soft, non tender, non distended, Bowel sounds present.  Heart: Examination of the heart reveals regular, rate, and rhythm. There is no murmur noted on ascultation. There is a normal apical pulse.  Lungs: Lungs are clear to auscultation. There is no wheeze, rhonchi, or crackles. There is normal expansion of bilateral chest walls.   Thoracolumbar spine: Examination of the thoracolumbar spine shows no spinous process tenderness. No paravertebral muscle tenderness. Incision sites are intact from previous kyphoplasty L2. No warmth erythema or drainage. No muscle spasms noted. He is minimally tender along the ribs and does have some 12th rib impingement along the iliac crest bilaterally but does not seem to be having any significant tenderness with palpation to this area. Does have a little bit of pain with leaning to the left into the right with palpable impingement. He is nontender throughout the abdomen, sternum. His abdomen is slightly distended but family member states this is at baseline. No grimacing with deep palpation of the abdomen. Patient with bilateral swelling and edema in both ankles with pitting edema. Negative Homans' sign bilaterally.  EXAM:  MRI THORACIC SPINE WITHOUT CONTRAST 08/24/2020  TECHNIQUE:  Multiplanar, multisequence MR imaging of the thoracic spine was  performed. No intravenous contrast was administered.   COMPARISON: None.   FINDINGS:  Alignment: No substantial subluxation. Mildly exaggerated thoracic  kyphosis.   Vertebrae: On the localizer sequence, partially imaged vertebral  augmentation at L2. There is a T8 compression fracture with  approximately 60-70% height loss anteriorly. There is sclerotic T2  hypointensity withinthe vertebral body with surrounding STIR  hyperintense marrow edema. Maintained height of the other thoracic  vertebral bodies  without focal abnormal marrow signal.   Cord: Normal cord signal.   Paraspinal and other soft tissues: There is STIR  hyperintensity/edema within the posterior paraspinal soft tissues  and interspinous ligaments at T8-T9, compatible with sprain. The  super spinous ligament and ligamentum flavum appear intact.   Disc levels:   At T8 there is approximately 4 mm of central bony retropulsion which  contacts and mildly indents the ventral cord. Overall, mild to  moderate resulting canal stenosis. There is also resulting moderate  bilateral foraminal stenosis at T8-T9. Small posterior disc bulge at  T7-T8. Otherwise, mild multilevel degenerative change without  significant canal stenosis. Mild multilevel foraminal stenosis.   IMPRESSION:  1. Unhealed (likely acute/recent) T8 compression fracture with  60-70% height loss. Associated 4 mm of central bony retropulsion  contacts and mildly indents the ventral cord with overall mild to  moderate canal stenosis and moderate bilateral foraminal stenosis at  T8-T9.  2. Interspinous strain at T8-T9 with intact supraspinous ligament  and ligamentum flavum.  3. Sclerosis of the T8 vertebral body is indeterminate but may  represent a metastatic lesion in this patient with known metastatic  prostate cancer if the patient does not have a history of vertebral  augmentation at  this level. Postcontrast imaging could provide more  complete evaluation if clinically indicated.  4. Partially imaged L2 vertebral augmentation.   These results will be called to the ordering clinician or  representative by the Radiologist Assistant, and communication  documented in the PACS or [employer].   Impression: Status post kyphoplasty [Z98.890] Status post kyphoplasty L2 (primary encounter diagnosis) Closed wedge compression fracture of T8 vertebra, initial encounter (CMS-HCC)  Plan:  70. 79 year old male status post L2 kyphoplasty 08/15/2020. Lower back  pain much improved. Denies severe midthoracic back pain, x-rays and MRI confirming T8 compression fracture. Pain is moderate to severe and inhibiting his ability to walk, move and perform activities daily living. He is requiring oxycodone for pain. Risks, benefits, complications of a T8 kyphoplasty have been discussed with the patient. Patient has agreed and consented procedure with Dr. Hessie Knows tomorrow on 08/29/2020. Last dose of Eliquis was on December 20. Has been holding this medication in preparation for hopefully getting surgery scheduled for 08/29/2020 This note was generated in part with voice recognition software and I apologize for any typographical errors that were not detected and corrected.  Feliberto Gottron MPA-C    Electronically signed by Feliberto Gottron, PA at 08/28/2020 5:28 PM EST    Reviewed  H+P. No changes noted.

## 2020-08-30 NOTE — Progress Notes (Signed)
PT Cancellation Note  Patient Details Name: [name] [name] MRN: [id number] DOB: [birth year]   Cancelled Treatment:    Reason Eval/Treat Not Completed: Patient at procedure or test/unavailable (Orders received for PT evaluation. Pt not found. [facility] says pt in phase II 03, however all beds empty. RN in PACU reports pt DC yesterday. Will sign off.)  11:49 AM, 08/30/20 Etta Grandchild, PT, DPT Physical Therapist - [facility]  [phone] ([place])    Courtne Lighty C 08/30/2020, 11:49 AM

## 2020-09-01 ENCOUNTER — Encounter: Payer: Self-pay | Admitting: Orthopedic Surgery

## 2020-09-03 ENCOUNTER — Ambulatory Visit: Payer: Medicare Other | Admitting: Physical Therapy

## 2020-09-03 ENCOUNTER — Ambulatory Visit: Payer: Self-pay | Admitting: Family Medicine

## 2020-09-03 ENCOUNTER — Ambulatory Visit: Payer: Medicare Other

## 2020-09-03 ENCOUNTER — Encounter: Payer: Self-pay | Admitting: Family Medicine

## 2020-09-03 ENCOUNTER — Ambulatory Visit: Payer: Medicare Other | Admitting: Occupational Therapy

## 2020-09-03 NOTE — Telephone Encounter (Signed)
Spoke to pt on phone- advised to first call surgeon and then he is probably going to need further eval in the ER for things such as clots/ PE- pt voiced understanding. He will call surgeon

## 2020-09-03 NOTE — Telephone Encounter (Addendum)
Pt has called in with multiple issues.  Wheezing on expiration, harder to get a breath in, chest tightness at times, swollen ankles, feet and lower legs for 3 wks since 1st back surgery, fatigue, (has Parkinson's), abd pain when he gets up from a chair or sitting.  He had back surgery a week ago.  He was wheezing then.  Chest x ray was done however he said they told him everything was fine on the x ray.  Protocol is to be seen within 4 hours.  I'm sending my notes to [facility] for Dr. Elizabeth Sauer to review for further direction.   Have him come into the office or do video visit due to some of his symptoms being consistant with covid 19, or going to the ED.    He was tested for covid a week ago prior to his back surgery and it was negative.  I let pt know I was going to check with Dr. Yetta Barre to see what her preference is and have someone call him back.   He was agreeable to this plan.  [phone] is correct number.  Notes sent to office.  I called into [facility] and spoke with [facility].   She is going to give my notes to Dr. Yetta Barre' nurse and call the pt. With further directions.  Reason for Disposition . [1] MILD difficulty breathing (e.g., minimal/no SOB at rest, SOB with walking, pulse <100) AND [2] NEW-onset or WORSE than normal  Answer Assessment - Initial Assessment Questions 1. RESPIRATORY STATUS: "Describe your breathing?" (e.g., wheezing, shortness of breath, unable to speak, severe coughing)      I'm wheezing for a couple of weeks.   Before that I had a runny nose.  Now I have a dry cough.  I hear wheezing when I breath out.   I have a tightness in my chest at times.    I have problems breathing in too.    I was tested for covid twice for 2 surgeries.   A wk ago Wed. I was negative. No asthma, COPD.   I use CPAP for sleep apnea.     I was wheezing prior to having surgery a wk ago.    They took x rays of my lungs and it was fine. 2. ONSET: "When did this  breathing problem begin?"      At least a week. 3. PATTERN "Does the difficult breathing come and go, or has it been constant since it started?"      Consistent. 4. SEVERITY: "How bad is your breathing?" (e.g., mild, moderate, severe)    - MILD: No SOB at rest, mild SOB with walking, speaks normally in sentences, can lay down, no retractions, pulse < 100.    - MODERATE: SOB at rest, SOB with minimal exertion and prefers to sit, cannot lie down flat, speaks in phrases, mild retractions, audible wheezing, pulse 100-120.    - SEVERE: Very SOB at rest, speaks in single words, struggling to breathe, sitting hunched forward, retractions, pulse > 120      I'm having difficulty taking a deep breath.  When I get up and walk around I get tired really quick that's been going on a while.   My feet are swollen.  Started after 1st surgery I had.   My ankles, feet and lower legs have been swollen for 3 weeks.   I elevated my feet when I can.   No discoloration but swelling doesn't go down when  I elevate my feet.  This is a new thing that's been going on 3 wks now. 5. RECURRENT SYMPTOM: "Have you had difficulty breathing before?" If Yes, ask: "When was the last time?" and "What happened that time?"      No  6. CARDIAC HISTORY: "Do you have any history of heart disease?" (e.g., heart attack, angina, bypass surgery, angioplasty)      I have A. Flutter that I take medication for. 7. LUNG HISTORY: "Do you have any history of lung disease?"  (e.g., pulmonary embolus, asthma, emphysema)     No 8. CAUSE: "What do you think is causing the breathing problem?"      I think from poor posture.  I'm trying to sit up straight.   I think my back problems could be causing some of this.  I also have Parkinson's Disease.    9. OTHER SYMPTOMS: "Do you have any other symptoms? (e.g., dizziness, runny nose, cough, chest pain, fever)     No dizziness.  No runny nose now for 3 wks.  Dry cough, chest tightness when I'm trying to get  up from a chair.   I also have pain in my lower abd when I get up from a chair.   I think it's my rib.   The 2nd surgery should help that.  I had a fractured spine at T4 and T2.   They put cement in between my vertebra.  Last week last surgery done.Marland Kitchen PREGNANCY: "Is there any chance you are pregnant?" "When was your last menstrual period?"       N/A 11. TRAVEL: "Have you traveled out of the country in the last month?" (e.g., travel history, exposures)       My wife fell and broke her hip and is in rehab.  I can't see her because I'm so uncomfortable.   I can't get comfortable.  Protocols used: BREATHING DIFFICULTY-A-AH

## 2020-09-04 LAB — SURGICAL PATHOLOGY

## 2020-09-04 NOTE — Anesthesia Postprocedure Evaluation (Signed)
Anesthesia Post Note  Patient: [name] [name]  Procedure(s) Performed: T8 KYPHOPLASTY (N/A Spine Thoracic)  Patient location during evaluation: PACU Anesthesia Type: General Level of consciousness: awake and alert and oriented Pain management: pain level controlled Vital Signs Assessment: post-procedure vital signs reviewed and stable Respiratory status: spontaneous breathing Cardiovascular status: blood pressure returned to baseline Anesthetic complications: no   No complications documented.   Last Vitals:  Vitals:   08/29/20 1631 08/29/20 1635  BP: (!) 150/93 (!) 145/89  Pulse: 75   Resp:  20  Temp: 36.8 C 37.1 C  SpO2: 100% (!) 87%    Last Pain:  Vitals:   09/02/20 0844  TempSrc:   PainSc: 2                  Calla Wedekind

## 2020-09-05 ENCOUNTER — Telehealth: Payer: Self-pay | Admitting: Neurology

## 2020-09-05 NOTE — Telephone Encounter (Signed)
ok 

## 2020-09-05 NOTE — Telephone Encounter (Signed)
Called physical therapy and noted.

## 2020-09-05 NOTE — Telephone Encounter (Signed)
Therapist calling to request verbal orders for OT evaluation to be moved to next week. Please call.

## 2020-09-05 NOTE — Telephone Encounter (Signed)
Okay to move till next week?

## 2020-09-09 DIAGNOSIS — M6281 Muscle weakness (generalized): Secondary | ICD-10-CM | POA: Diagnosis not present

## 2020-09-09 DIAGNOSIS — Z743 Need for continuous supervision: Secondary | ICD-10-CM | POA: Diagnosis not present

## 2020-09-09 DIAGNOSIS — Z9181 History of falling: Secondary | ICD-10-CM | POA: Diagnosis not present

## 2020-09-09 DIAGNOSIS — G20 Parkinson's disease: Secondary | ICD-10-CM | POA: Diagnosis not present

## 2020-09-09 DIAGNOSIS — C61 Malignant neoplasm of prostate: Secondary | ICD-10-CM | POA: Diagnosis not present

## 2020-09-09 DIAGNOSIS — R2681 Unsteadiness on feet: Secondary | ICD-10-CM | POA: Diagnosis not present

## 2020-09-10 ENCOUNTER — Ambulatory Visit
Admission: RE | Admit: 2020-09-10 | Discharge: 2020-09-10 | Disposition: A | Discharge status: Home / Self Care | Payer: Medicare Other | Source: Ambulatory Visit | Attending: Family Medicine | Admitting: Family Medicine

## 2020-09-10 ENCOUNTER — Other Ambulatory Visit: Payer: Self-pay

## 2020-09-10 ENCOUNTER — Ambulatory Visit (INDEPENDENT_AMBULATORY_CARE_PROVIDER_SITE_OTHER): Payer: Medicare Other | Admitting: Family Medicine

## 2020-09-10 ENCOUNTER — Encounter: Payer: Self-pay | Admitting: Family Medicine

## 2020-09-10 ENCOUNTER — Ambulatory Visit
Admission: RE | Admit: 2020-09-10 | Discharge: 2020-09-10 | Disposition: A | Discharge status: Home / Self Care | Payer: Medicare Other | Attending: Family Medicine | Admitting: Family Medicine

## 2020-09-10 VITALS — BP 110/60 | HR 72 | Ht 69.0 in | Wt 146.0 lb

## 2020-09-10 DIAGNOSIS — C7951 Secondary malignant neoplasm of bone: Secondary | ICD-10-CM

## 2020-09-10 DIAGNOSIS — Z022 Encounter for examination for admission to residential institution: Secondary | ICD-10-CM | POA: Insufficient documentation

## 2020-09-10 DIAGNOSIS — J9811 Atelectasis: Secondary | ICD-10-CM | POA: Diagnosis not present

## 2020-09-10 DIAGNOSIS — Z111 Encounter for screening for respiratory tuberculosis: Secondary | ICD-10-CM | POA: Diagnosis not present

## 2020-09-10 DIAGNOSIS — R609 Edema, unspecified: Secondary | ICD-10-CM

## 2020-09-10 DIAGNOSIS — I517 Cardiomegaly: Secondary | ICD-10-CM | POA: Diagnosis not present

## 2020-09-10 DIAGNOSIS — C61 Malignant neoplasm of prostate: Secondary | ICD-10-CM | POA: Diagnosis not present

## 2020-09-10 DIAGNOSIS — I4892 Unspecified atrial flutter: Secondary | ICD-10-CM | POA: Diagnosis not present

## 2020-09-10 DIAGNOSIS — G20 Parkinson's disease: Secondary | ICD-10-CM | POA: Diagnosis not present

## 2020-09-10 DIAGNOSIS — R2681 Unsteadiness on feet: Secondary | ICD-10-CM | POA: Diagnosis not present

## 2020-09-10 DIAGNOSIS — J439 Emphysema, unspecified: Secondary | ICD-10-CM | POA: Diagnosis not present

## 2020-09-10 DIAGNOSIS — Z9181 History of falling: Secondary | ICD-10-CM | POA: Diagnosis not present

## 2020-09-10 DIAGNOSIS — M8088XA Other osteoporosis with current pathological fracture, vertebra(e), initial encounter for fracture: Secondary | ICD-10-CM | POA: Diagnosis not present

## 2020-09-10 DIAGNOSIS — B370 Candidal stomatitis: Secondary | ICD-10-CM | POA: Diagnosis not present

## 2020-09-10 DIAGNOSIS — M6281 Muscle weakness (generalized): Secondary | ICD-10-CM | POA: Diagnosis not present

## 2020-09-10 DIAGNOSIS — Z743 Need for continuous supervision: Secondary | ICD-10-CM | POA: Diagnosis not present

## 2020-09-10 DIAGNOSIS — R21 Rash and other nonspecific skin eruption: Secondary | ICD-10-CM | POA: Diagnosis not present

## 2020-09-10 DIAGNOSIS — J90 Pleural effusion, not elsewhere classified: Secondary | ICD-10-CM | POA: Diagnosis not present

## 2020-09-10 MED ORDER — NYSTATIN 100000 UNIT/ML MT SUSP: 5.0000 mL | Freq: Four times a day (QID) | OROMUCOSAL | 0 refills | Status: DC

## 2020-09-10 NOTE — Patient Instructions (Signed)
Peripheral Edema  Peripheral edema is swelling that is caused by a buildup of fluid. Peripheral edema most often affects the lower legs, ankles, and feet. It can also develop in the arms, hands, and face. The area of the body that has peripheral edema will look swollen. It may also feel heavy or warm. Your clothes may start to feel tight. Pressing on the area may make a temporary dent in your skin. You may not be able to move your swollen arm or leg as much as usual. There are many causes of peripheral edema. It can happen because of a complication of other conditions such as congestive heart failure, kidney disease, or a problem with your blood circulation. It also can be a side effect of certain medicines or because of an infection. It often happens to women during pregnancy. Sometimes, the cause is not known. Follow these instructions at home: Managing pain, stiffness, and swelling   Raise (elevate) your legs while you are sitting or lying down.  Move around often to prevent stiffness and to lessen swelling.  Do not sit or stand for long periods of time.  Wear support stockings as told by your health care provider. Medicines  Take over-the-counter and prescription medicines only as told by your health care provider.  Your health care provider may prescribe medicine to help your body get rid of excess water (diuretic). General instructions  Pay attention to any changes in your symptoms.  Follow instructions from your health care provider about limiting salt (sodium) in your diet. Sometimes, eating less salt may reduce swelling.  Moisturize skin daily to help prevent skin from cracking and draining.  Keep all follow-up visits as told by your health care provider. This is important. Contact a health care provider if you have:  A fever.  Edema that starts suddenly or is getting worse, especially if you are pregnant or have a medical condition.  Swelling in only one leg.  Increased  swelling, redness, or pain in one or both of your legs.  Drainage or sores at the area where you have edema. Get help right away if you:  Develop shortness of breath, especially when you are lying down.  Have pain in your chest or abdomen.  Feel weak.  Feel faint. Summary  Peripheral edema is swelling that is caused by a buildup of fluid. Peripheral edema most often affects the lower legs, ankles, and feet.  Move around often to prevent stiffness and to lessen swelling. Do not sit or stand for long periods of time.  Pay attention to any changes in your symptoms.  Contact a health care provider if you have edema that starts suddenly or is getting worse, especially if you are pregnant or have a medical condition.  Get help right away if you develop shortness of breath, especially when lying down. This information is not intended to replace advice given to you by your health care provider. Make sure you discuss any questions you have with your health care provider. Document Revised: 05/18/2018 Document Reviewed: 05/18/2018 Elsevier Patient Education  2020 [employer]  

## 2020-09-10 NOTE — Progress Notes (Signed)
Date:  09/10/2020   Name:  [name] [name]   DOB:  [birth year]   MRN:  [id number]   Chief Complaint: face to face for facility and Leg Swelling (Lower leg into ankles x 2 months)    Lab Results  Component Value Date   CREATININE 1.52 (H) 08/08/2020   BUN 23 08/08/2020   NA 139 08/08/2020   K 4.0 08/08/2020   CL 100 08/08/2020   CO2 27 08/08/2020   Lab Results  Component Value Date   CHOL 216 (H) 08/17/2019   HDL 72 08/17/2019   LDLCALC 124 (H) 08/17/2019   TRIG 98 08/17/2019   CHOLHDL 3.0 08/17/2019   Lab Results  Component Value Date   TSH 1.217 08/16/2019   No results found for: HGBA1C Lab Results  Component Value Date   WBC 10.7 (H) 08/08/2020   HGB 14.5 08/08/2020   HCT 45.1 08/08/2020   MCV 95.6 08/08/2020   PLT 247 08/08/2020   Lab Results  Component Value Date   ALT 8 08/08/2020   AST 30 08/08/2020   ALKPHOS 76 08/08/2020   BILITOT 2.4 (H) 08/08/2020     Review of Systems  Constitutional: Negative for chills and fever.  HENT: Negative for drooling, ear discharge, ear pain and sore throat.   Respiratory: Negative for cough, shortness of breath and wheezing.   Cardiovascular: Negative for chest pain, palpitations and leg swelling.  Gastrointestinal: Negative for abdominal pain, blood in stool, constipation, diarrhea and nausea.  Endocrine: Negative for polydipsia.  Genitourinary: Negative for dysuria, frequency, hematuria and urgency.  Musculoskeletal: Negative for back pain, myalgias and neck pain.  Skin: Negative for rash.  Allergic/Immunologic: Negative for environmental allergies.  Neurological: Negative for dizziness and headaches.  Hematological: Does not bruise/bleed easily.  Psychiatric/Behavioral: Negative for suicidal ideas. The patient is not nervous/anxious.     Patient Active Problem List   Diagnosis Date Noted  . 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 Atherosclerosis of aorta  ([place]) 04/10/2020  . Heart palpitations 12/29/2019  . Atrial flutter with rapid ventricular response ([place]) 08/18/2019  . New onset atrial flutter ([place]) 08/16/2019  . Goals of care, counseling/discussion 07/17/2019  . Prostate cancer metastatic to bone ([place]) 05/24/2019  . Establishing care with new doctor, encounter for 09/26/2018  . 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 Parkinson's disease ([place]) 11/07/2015  . 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 Hx of colonic polyps 05/18/2014  . BPH (benign prostatic hyperplasia) 01/12/2014  . Sleep apnea treated with nocturnal BiPAP 01/17/2012  . 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 Influenza Vaccines Anaphylaxis    Flu shot: 1974 anaphylaxis. 2nd time swollen arm    Past Surgical History:  Procedure Laterality Date  . APPENDECTOMY    . 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 KYPHOPLASTY N/A 08/15/2020   Procedure: L2 compression fracture;  Surgeon: Hessie Knows, MD;  Location: [facility] ORS;  Service: Orthopedics;  Laterality: N/A;  . KYPHOPLASTY N/A 08/29/2020   Procedure: T8 KYPHOPLASTY;  Surgeon: Hessie Knows, MD;  Location: [facility] ORS;  Service: Orthopedics;  Laterality: N/A;    Social History   Tobacco Use  . Smoking status: Former Smoker    Years: 4.00    Types: Cigars    Quit date: 09/07/2010    Years since quitting: 10.0  . Smokeless tobacco: Never Used  . Tobacco comment: 2 CIGARS PER DAY  Vaping Use  . Vaping Use: Never used  Substance Use Topics  . Alcohol use: Not Currently    Comment: 1 can beer/day  . Drug use: Never     Medication list has been reviewed and updated.  Current Meds  Medication Sig  . apixaban (ELIQUIS) 5 MG TABS tablet Take 1 tablet (5 mg total) by mouth 2 (two) times daily.  . carbidopa-levodopa (SINEMET CR) 50-200 MG tablet Take 1 tablet by mouth at bedtime.  . carbidopa-levodopa (SINEMET IR) 25-100 MG tablet 3 tablets at 7 AM/2 at 11 AM/2 tablets at 3  PM/2 tablet at 7 PM  . clonazePAM (KLONOPIN) 0.5 MG tablet Take 0.5 mg by mouth at bedtime. Dr Quintin Altoadtke  . diltiazem (CARDIZEM CD) 120 MG 24 hr capsule Take 1 capsule (120 mg total) by mouth daily.  . Glycerin-Hypromellose-PEG 400 (VISINE DRY EYE OP) Place 1 drop into both eyes 2 (two) times daily as needed (eye irritation).   Marland Kitchen. ketoconazole (NIZORAL) 2 % shampoo Apply 1 application topically once a week.   Marland Kitchen. oxycodone (OXY-IR) 5 MG capsule Take 1 capsule (5 mg total) by mouth every 6 (six) hours as needed for pain.  . tamsulosin (FLOMAX) 0.4 MG CAPS capsule Take 1 capsule (0.4 mg total) by mouth daily.    PHQ 2/9 Scores 04/10/2020 01/25/2020 11/22/2019 08/16/2019  PHQ - 2 Score 0 0 0 0  PHQ- 9 Score 0 - 0 0    GAD 7 : Generalized Anxiety Score 04/10/2020 11/22/2019 08/16/2019  Nervous, Anxious, on Edge 0 0 1  Control/stop worrying 0 0 0  Worry too much - different things 0 0 0  Trouble relaxing 0 0 0  Restless 0 0 0  Easily annoyed or irritable 0 0 1  Afraid - awful might happen 0 0 0  Total GAD 7 Score 0 0 2  Anxiety Difficulty Not difficult at all - Not difficult at all    BP Readings from Last 3 Encounters:  09/10/20 110/60  08/29/20 (!) 145/89  08/20/20 (!) 84/62    Physical Exam Vitals and nursing note reviewed.  HENT:     Head: Normocephalic.     Right Ear: Tympanic membrane, ear canal and external ear normal.     Left Ear: Tympanic membrane, ear canal and external ear normal.     Nose: Nose normal.     Mouth/Throat:     Mouth: Oropharynx is clear and moist.  Eyes:     General: No scleral icterus.       Right eye: No discharge.        Left eye: No discharge.     Extraocular Movements: EOM normal.     Conjunctiva/sclera: Conjunctivae normal.     Pupils: Pupils are equal, round, and reactive to light.  Neck:     Thyroid: No thyromegaly.     Vascular: No carotid bruit or JVD.     Trachea: No tracheal deviation.  Cardiovascular:     Rate and Rhythm: Normal rate and  regular rhythm.     Pulses: Intact distal pulses.     Heart sounds: Normal heart sounds, S1 normal and S2 normal. No murmur heard.  No systolic murmur is present.  No diastolic murmur is present. No friction rub. No gallop. No S3 or S4 sounds.   Pulmonary:     Effort: No respiratory distress.     Breath sounds: Normal breath sounds. No wheezing, rhonchi or rales.  Abdominal:     General: Bowel  sounds are normal.     Palpations: Abdomen is soft. There is no hepatosplenomegaly or mass.     Tenderness: There is no abdominal tenderness. There is no CVA tenderness, guarding or rebound.  Musculoskeletal:        General: No tenderness. Normal range of motion.     Cervical back: Normal range of motion and neck supple.     Right lower leg: 1+ Pitting Edema present.     Left lower leg: 1+ Pitting Edema present.  Lymphadenopathy:     Cervical: No cervical adenopathy.  Skin:    General: Skin is warm.     Findings: No rash.  Neurological:     Mental Status: He is alert and oriented to person, place, and time.     Cranial Nerves: No cranial nerve deficit.     Deep Tendon Reflexes: Strength normal and reflexes are normal and symmetric.     Wt Readings from Last 3 Encounters:  09/10/20 146 lb (66.2 kg)  08/29/20 160 lb (72.6 kg)  08/20/20 157 lb (71.2 kg)    BP 110/60   Pulse 72   Ht 5\' 9"  (1.753 m)   Wt 146 lb (66.2 kg)   BMI 21.56 kg/m   Assessment and Plan:  1. Encounter for examination for admission to assisted living facility Patient presents for evaluation and history prior to admission to assisted living.  Patient has no contraindication for admission.  Patient has recently had surgery and is recovering well but is sitting with feet in dependent position with resultant 1+ edema.  Patient's been instructed to elevate legs and to do more walking as tolerated.  Chest x-ray ordered for prior evaluation. - DG Chest 2 View; Future  2. Prostate cancer metastatic to bone [facility]) Patient  is followed by oncology for prostate cancer metastatic to bone by Dr. [facility].  3. Atrial flutter with rapid ventricular response [facility]) Patient is followed by cardiology and is currently on diltiazem and Eliquis.  4. 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  5. Dependent edema Patient is recovering from surgery and is doing more sitting with feet in dependent position.  Patient has not been able to use stockings in the past.  Patient's been instructed to elevate legs when sitting and to walk as tolerated.  6. Oral candida Patient has exam consistent with oral candidiasis and has recently had prednisone stopped.  We will use nystatin oral suspension a teaspoon swish and swallow 4 times a day. - nystatin (MYCOSTATIN) 100000 UNIT/ML suspension; Use as directed 5 mLs (500,000 Units total) in the mouth or throat 4 (four) times daily. Swish and swallow 3 to 4 times a day  Dispense: 60 mL; Refill: 0  7. Parkinsonism Patient is followed by neurology and is currently stable on medications.

## 2020-09-12 DIAGNOSIS — M5414 Radiculopathy, thoracic region: Secondary | ICD-10-CM | POA: Diagnosis not present

## 2020-09-12 DIAGNOSIS — Z9181 History of falling: Secondary | ICD-10-CM | POA: Diagnosis not present

## 2020-09-12 DIAGNOSIS — M6281 Muscle weakness (generalized): Secondary | ICD-10-CM | POA: Diagnosis not present

## 2020-09-12 DIAGNOSIS — Z743 Need for continuous supervision: Secondary | ICD-10-CM | POA: Diagnosis not present

## 2020-09-12 DIAGNOSIS — Z9889 Other specified postprocedural states: Secondary | ICD-10-CM | POA: Diagnosis not present

## 2020-09-12 DIAGNOSIS — C61 Malignant neoplasm of prostate: Secondary | ICD-10-CM | POA: Diagnosis not present

## 2020-09-12 DIAGNOSIS — G20 Parkinson's disease: Secondary | ICD-10-CM | POA: Diagnosis not present

## 2020-09-12 DIAGNOSIS — R2681 Unsteadiness on feet: Secondary | ICD-10-CM | POA: Diagnosis not present

## 2020-09-12 DIAGNOSIS — S22060A Wedge compression fracture of T7-T8 vertebra, initial encounter for closed fracture: Secondary | ICD-10-CM | POA: Diagnosis not present

## 2020-09-13 ENCOUNTER — Telehealth: Payer: Self-pay | Admitting: Neurology

## 2020-09-13 NOTE — Telephone Encounter (Signed)
Left detailed message with verbal orders given per Dr Tat. Advised a call back with any questions or concerns.

## 2020-09-13 NOTE — Telephone Encounter (Signed)
Will, therapist from [facility], left a voicemail requesting orders for patient. He completed the OT eval. Now is needing order for OT 2x/week for 2 weeks and then 1x/week for the 3rd week. He stated that his voicemail is secure so you can leave a detailed message.

## 2020-09-13 NOTE — Telephone Encounter (Signed)
ok 

## 2020-09-16 LAB — TB SKIN TEST
Induration: 0 mm
TB Skin Test: NEGATIVE

## 2020-09-23 ENCOUNTER — Inpatient Hospital Stay: Payer: Medicare Other

## 2020-09-23 DIAGNOSIS — C61 Malignant neoplasm of prostate: Secondary | ICD-10-CM | POA: Diagnosis not present

## 2020-09-23 DIAGNOSIS — G20 Parkinson's disease: Secondary | ICD-10-CM | POA: Diagnosis not present

## 2020-09-23 DIAGNOSIS — M6281 Muscle weakness (generalized): Secondary | ICD-10-CM | POA: Diagnosis not present

## 2020-09-23 DIAGNOSIS — R2681 Unsteadiness on feet: Secondary | ICD-10-CM | POA: Diagnosis not present

## 2020-09-23 DIAGNOSIS — Z743 Need for continuous supervision: Secondary | ICD-10-CM | POA: Diagnosis not present

## 2020-09-23 DIAGNOSIS — Z9181 History of falling: Secondary | ICD-10-CM | POA: Diagnosis not present

## 2020-09-24 DIAGNOSIS — Z9181 History of falling: Secondary | ICD-10-CM | POA: Diagnosis not present

## 2020-09-24 DIAGNOSIS — G20 Parkinson's disease: Secondary | ICD-10-CM | POA: Diagnosis not present

## 2020-09-24 DIAGNOSIS — C61 Malignant neoplasm of prostate: Secondary | ICD-10-CM | POA: Diagnosis not present

## 2020-09-24 DIAGNOSIS — R2681 Unsteadiness on feet: Secondary | ICD-10-CM | POA: Diagnosis not present

## 2020-09-24 DIAGNOSIS — M6281 Muscle weakness (generalized): Secondary | ICD-10-CM | POA: Diagnosis not present

## 2020-09-24 DIAGNOSIS — Z743 Need for continuous supervision: Secondary | ICD-10-CM | POA: Diagnosis not present

## 2020-09-25 ENCOUNTER — Inpatient Hospital Stay: Payer: Medicare Other

## 2020-09-25 ENCOUNTER — Inpatient Hospital Stay: Payer: Medicare Other | Admitting: Internal Medicine

## 2020-09-25 DIAGNOSIS — Z743 Need for continuous supervision: Secondary | ICD-10-CM | POA: Diagnosis not present

## 2020-09-25 DIAGNOSIS — Z9181 History of falling: Secondary | ICD-10-CM | POA: Diagnosis not present

## 2020-09-25 DIAGNOSIS — C61 Malignant neoplasm of prostate: Secondary | ICD-10-CM | POA: Diagnosis not present

## 2020-09-25 DIAGNOSIS — R2681 Unsteadiness on feet: Secondary | ICD-10-CM | POA: Diagnosis not present

## 2020-09-25 DIAGNOSIS — M6281 Muscle weakness (generalized): Secondary | ICD-10-CM | POA: Diagnosis not present

## 2020-09-25 DIAGNOSIS — G20 Parkinson's disease: Secondary | ICD-10-CM | POA: Diagnosis not present

## 2020-09-30 ENCOUNTER — Ambulatory Visit: Payer: Self-pay | Admitting: Urology

## 2020-10-01 ENCOUNTER — Telehealth: Payer: Self-pay

## 2020-10-01 DIAGNOSIS — Z9181 History of falling: Secondary | ICD-10-CM | POA: Diagnosis not present

## 2020-10-01 DIAGNOSIS — G20 Parkinson's disease: Secondary | ICD-10-CM | POA: Diagnosis not present

## 2020-10-01 DIAGNOSIS — M6281 Muscle weakness (generalized): Secondary | ICD-10-CM | POA: Diagnosis not present

## 2020-10-01 DIAGNOSIS — R2681 Unsteadiness on feet: Secondary | ICD-10-CM | POA: Diagnosis not present

## 2020-10-01 DIAGNOSIS — C61 Malignant neoplasm of prostate: Secondary | ICD-10-CM | POA: Diagnosis not present

## 2020-10-01 DIAGNOSIS — Z743 Need for continuous supervision: Secondary | ICD-10-CM | POA: Diagnosis not present

## 2020-10-01 NOTE — Telephone Encounter (Signed)
No PA required unless change in benefits.

## 2020-10-02 ENCOUNTER — Other Ambulatory Visit: Payer: Self-pay

## 2020-10-02 ENCOUNTER — Other Ambulatory Visit: Payer: Self-pay | Admitting: Physical Medicine & Rehabilitation

## 2020-10-02 ENCOUNTER — Ambulatory Visit (INDEPENDENT_AMBULATORY_CARE_PROVIDER_SITE_OTHER): Payer: Medicare Other | Admitting: Urology

## 2020-10-02 ENCOUNTER — Encounter: Payer: Self-pay | Admitting: Urology

## 2020-10-02 VITALS — BP 84/47 | HR 73 | Ht 69.0 in | Wt 142.0 lb

## 2020-10-02 DIAGNOSIS — M5414 Radiculopathy, thoracic region: Secondary | ICD-10-CM

## 2020-10-02 DIAGNOSIS — N400 Enlarged prostate without lower urinary tract symptoms: Secondary | ICD-10-CM | POA: Diagnosis not present

## 2020-10-02 DIAGNOSIS — M546 Pain in thoracic spine: Secondary | ICD-10-CM

## 2020-10-02 DIAGNOSIS — C61 Malignant neoplasm of prostate: Secondary | ICD-10-CM | POA: Diagnosis not present

## 2020-10-02 MED ORDER — LEUPROLIDE ACETATE (6 MONTH) 45 MG ~~LOC~~ KIT
45.0000 mg | PACK | Freq: Once | SUBCUTANEOUS | Status: AC
Start: 2020-10-02 — End: 2020-10-02
  Administered 2020-10-02: 45 mg via SUBCUTANEOUS

## 2020-10-02 NOTE — Progress Notes (Signed)
Eligard SubQ Injection   Due to Prostate Cancer patient is present today for a Eligard Injection.  Medication: Eligard 6 month Dose: 45 mg  Location: right  Lot: [id number] Exp: 01/2022  Patient tolerated well, no complications were noted  Performed by: Fonnie Jarvis, CMA  Per Dr. Diamantina Providence patient is to continue therapy for continuous.   Patient's next follow up was scheduled for 04/02/2021. This appointment was scheduled using wheel and given to patient today along with reminder continue on Vitamin D 800-1000iu and Calium 1000-1200mg  daily while on Androgen Deprivation Therapy.  PA approval dates: NO PA required

## 2020-10-02 NOTE — Progress Notes (Signed)
   10/02/2020 10:54 AM   [name] [name] 08-11-1941 [id number]  Reason for visit: Follow up prostate cancer, urinary symptoms  HPI: I saw [name] back in urology clinic today for follow-up of prostate cancer.  He is a 80 year old male with Parkinson's disease, solitary right kidney, and metastatic prostate cancer(diagnosed August 2020: 4+5=9, 12/12 cores, >80% involvement) to the left ischial tuberosity/ilium currently treated with ADT(urology).  He was previously on Zytiga and prednisone with oncology, but this was stopped secondary to A. fib/RVR.  He also has a history of bothersome urinary symptoms and incomplete bladder emptying, as well as a history of a PVP with an outside urologist a few years ago.  He reports that his urinary symptoms have significantly improved after being on ADT for over a year now, and he really denies any bothersome urinary symptoms today.  He is very satisfied with his urination at this time, and PVR is normal at 40 mL.  He remains on Flomax, and has had trouble with low blood pressures recently.  I strongly recommended he discontinue the Flomax as his urinary symptoms are currently very well controlled on ADT, and this may be contributing to his low blood pressures.  He has also had a excellent PSA response to treatment, and most recent PSA was stably low at 0.31 in December 2021.  He has also had multiple vertebral compression fractures over the last few months requiring repair with orthopedics.  Biopsy showed no evidence of metastatic prostate cancer.  Continue ADT(32-month Eligard given today), encouraged calcium and vitamin D supplementation Stop Flomax Continue oncology follow-up RTC 6 months with PSA prior, PVR  Billey Co, MD  [facility] [address] [place], [place] [address] [phone]

## 2020-10-03 DIAGNOSIS — C61 Malignant neoplasm of prostate: Secondary | ICD-10-CM | POA: Diagnosis not present

## 2020-10-03 DIAGNOSIS — G20 Parkinson's disease: Secondary | ICD-10-CM | POA: Diagnosis not present

## 2020-10-03 DIAGNOSIS — Z9181 History of falling: Secondary | ICD-10-CM | POA: Diagnosis not present

## 2020-10-03 DIAGNOSIS — M6281 Muscle weakness (generalized): Secondary | ICD-10-CM | POA: Diagnosis not present

## 2020-10-03 DIAGNOSIS — Z743 Need for continuous supervision: Secondary | ICD-10-CM | POA: Diagnosis not present

## 2020-10-03 DIAGNOSIS — R2681 Unsteadiness on feet: Secondary | ICD-10-CM | POA: Diagnosis not present

## 2020-10-04 ENCOUNTER — Other Ambulatory Visit: Payer: Self-pay

## 2020-10-04 ENCOUNTER — Ambulatory Visit
Admission: RE | Admit: 2020-10-04 | Discharge: 2020-10-04 | Disposition: A | Discharge status: Home / Self Care | Payer: Medicare Other | Source: Ambulatory Visit | Attending: Physical Medicine & Rehabilitation | Admitting: Physical Medicine & Rehabilitation

## 2020-10-04 DIAGNOSIS — M5414 Radiculopathy, thoracic region: Secondary | ICD-10-CM | POA: Insufficient documentation

## 2020-10-04 DIAGNOSIS — M546 Pain in thoracic spine: Secondary | ICD-10-CM | POA: Insufficient documentation

## 2020-10-04 DIAGNOSIS — M5134 Other intervertebral disc degeneration, thoracic region: Secondary | ICD-10-CM | POA: Diagnosis not present

## 2020-10-04 DIAGNOSIS — R0781 Pleurodynia: Secondary | ICD-10-CM | POA: Diagnosis not present

## 2020-10-04 DIAGNOSIS — S22070A Wedge compression fracture of T9-T10 vertebra, initial encounter for closed fracture: Secondary | ICD-10-CM | POA: Diagnosis not present

## 2020-10-04 DIAGNOSIS — S22060A Wedge compression fracture of T7-T8 vertebra, initial encounter for closed fracture: Secondary | ICD-10-CM | POA: Diagnosis not present

## 2020-10-04 MED ORDER — GADOBUTROL 1 MMOL/ML IV SOLN
7.0000 mL | Freq: Once | INTRAVENOUS | Status: AC | PRN
  Administered 2020-10-04: 7 mL via INTRAVENOUS

## 2020-10-08 DIAGNOSIS — S22000A Wedge compression fracture of unspecified thoracic vertebra, initial encounter for closed fracture: Secondary | ICD-10-CM | POA: Diagnosis not present

## 2020-10-08 DIAGNOSIS — M5414 Radiculopathy, thoracic region: Secondary | ICD-10-CM | POA: Diagnosis not present

## 2020-10-08 DIAGNOSIS — M546 Pain in thoracic spine: Secondary | ICD-10-CM | POA: Diagnosis not present

## 2020-10-09 ENCOUNTER — Inpatient Hospital Stay: Payer: Medicare Other | Attending: Internal Medicine

## 2020-10-09 DIAGNOSIS — Z9049 Acquired absence of other specified parts of digestive tract: Secondary | ICD-10-CM | POA: Insufficient documentation

## 2020-10-09 DIAGNOSIS — M6281 Muscle weakness (generalized): Secondary | ICD-10-CM | POA: Diagnosis not present

## 2020-10-09 DIAGNOSIS — Z7901 Long term (current) use of anticoagulants: Secondary | ICD-10-CM | POA: Insufficient documentation

## 2020-10-09 DIAGNOSIS — Z8249 Family history of ischemic heart disease and other diseases of the circulatory system: Secondary | ICD-10-CM | POA: Insufficient documentation

## 2020-10-09 DIAGNOSIS — C61 Malignant neoplasm of prostate: Secondary | ICD-10-CM | POA: Diagnosis not present

## 2020-10-09 DIAGNOSIS — Z87891 Personal history of nicotine dependence: Secondary | ICD-10-CM | POA: Insufficient documentation

## 2020-10-09 DIAGNOSIS — R2681 Unsteadiness on feet: Secondary | ICD-10-CM | POA: Diagnosis not present

## 2020-10-09 DIAGNOSIS — Z833 Family history of diabetes mellitus: Secondary | ICD-10-CM | POA: Diagnosis not present

## 2020-10-09 DIAGNOSIS — M549 Dorsalgia, unspecified: Secondary | ICD-10-CM | POA: Insufficient documentation

## 2020-10-09 DIAGNOSIS — R5383 Other fatigue: Secondary | ICD-10-CM | POA: Diagnosis not present

## 2020-10-09 DIAGNOSIS — C7951 Secondary malignant neoplasm of bone: Secondary | ICD-10-CM | POA: Diagnosis not present

## 2020-10-09 DIAGNOSIS — Z79899 Other long term (current) drug therapy: Secondary | ICD-10-CM | POA: Insufficient documentation

## 2020-10-09 DIAGNOSIS — Z9181 History of falling: Secondary | ICD-10-CM | POA: Diagnosis not present

## 2020-10-09 DIAGNOSIS — R0781 Pleurodynia: Secondary | ICD-10-CM | POA: Diagnosis not present

## 2020-10-09 DIAGNOSIS — R609 Edema, unspecified: Secondary | ICD-10-CM | POA: Insufficient documentation

## 2020-10-09 DIAGNOSIS — N183 Chronic kidney disease, stage 3 unspecified: Secondary | ICD-10-CM | POA: Insufficient documentation

## 2020-10-09 DIAGNOSIS — R232 Flushing: Secondary | ICD-10-CM | POA: Diagnosis not present

## 2020-10-09 DIAGNOSIS — K5900 Constipation, unspecified: Secondary | ICD-10-CM | POA: Diagnosis not present

## 2020-10-09 DIAGNOSIS — G20 Parkinson's disease: Secondary | ICD-10-CM | POA: Diagnosis not present

## 2020-10-09 DIAGNOSIS — Z743 Need for continuous supervision: Secondary | ICD-10-CM | POA: Diagnosis not present

## 2020-10-09 DIAGNOSIS — Z803 Family history of malignant neoplasm of breast: Secondary | ICD-10-CM | POA: Insufficient documentation

## 2020-10-09 LAB — COMPREHENSIVE METABOLIC PANEL
ALT: <5 U/L (ref 0–44)
AST: 29 U/L (ref 15–41)
Albumin: 3.8 g/dL (ref 3.5–5.0)
Alkaline Phosphatase: 71 U/L (ref 38–126)
Anion gap: 10 (ref 5–15)
BUN: 23 mg/dL (ref 8–23)
CO2: 26 mmol/L (ref 22–32)
Calcium: 9.9 mg/dL (ref 8.9–10.3)
Chloride: 103 mmol/L (ref 98–111)
Creatinine, Ser: 1.3 mg/dL — ABNORMAL HIGH (ref 0.61–1.24)
GFR, Estimated: 56 mL/min — ABNORMAL LOW (ref >60)
Glucose, Bld: 107 mg/dL — ABNORMAL HIGH (ref 70–99)
Potassium: 3.9 mmol/L (ref 3.5–5.1)
Sodium: 139 mmol/L (ref 135–145)
Total Bilirubin: 1 mg/dL (ref 0.3–1.2)
Total Protein: 7.1 g/dL (ref 6.5–8.1)

## 2020-10-09 LAB — CBC WITH DIFFERENTIAL/PLATELET
Abs Immature Granulocytes: 0.01 10*3/uL (ref 0.00–0.07)
Basophils Absolute: 0.1 10*3/uL (ref 0.0–0.1)
Basophils Relative: 1 %
Eosinophils Absolute: 0.2 10*3/uL (ref 0.0–0.5)
Eosinophils Relative: 3 %
HCT: 38.1 % — ABNORMAL LOW (ref 39.0–52.0)
Hemoglobin: 12.2 g/dL — ABNORMAL LOW (ref 13.0–17.0)
Immature Granulocytes: 0 %
Lymphocytes Relative: 24 %
Lymphs Abs: 1.4 10*3/uL (ref 0.7–4.0)
MCH: 29.2 pg (ref 26.0–34.0)
MCHC: 32 g/dL (ref 30.0–36.0)
MCV: 91.1 fL (ref 80.0–100.0)
Monocytes Absolute: 0.6 10*3/uL (ref 0.1–1.0)
Monocytes Relative: 9 %
Neutro Abs: 3.8 10*3/uL (ref 1.7–7.7)
Neutrophils Relative %: 63 %
Platelets: 214 10*3/uL (ref 150–400)
RBC: 4.18 MIL/uL — ABNORMAL LOW (ref 4.22–5.81)
RDW: 14 % (ref 11.5–15.5)
WBC: 6 10*3/uL (ref 4.0–10.5)
nRBC: 0 % (ref 0.0–0.2)

## 2020-10-09 LAB — PSA: Prostatic Specific Antigen: 0.2 ng/mL (ref 0.00–4.00)

## 2020-10-10 ENCOUNTER — Other Ambulatory Visit: Payer: Self-pay

## 2020-10-10 DIAGNOSIS — C61 Malignant neoplasm of prostate: Secondary | ICD-10-CM

## 2020-10-10 DIAGNOSIS — C7951 Secondary malignant neoplasm of bone: Secondary | ICD-10-CM

## 2020-10-11 ENCOUNTER — Inpatient Hospital Stay: Payer: Medicare Other

## 2020-10-11 ENCOUNTER — Inpatient Hospital Stay (HOSPITAL_BASED_OUTPATIENT_CLINIC_OR_DEPARTMENT_OTHER): Payer: Medicare Other | Admitting: Internal Medicine

## 2020-10-11 ENCOUNTER — Encounter: Payer: Self-pay | Admitting: Internal Medicine

## 2020-10-11 ENCOUNTER — Telehealth: Payer: Self-pay

## 2020-10-11 ENCOUNTER — Other Ambulatory Visit: Payer: Self-pay

## 2020-10-11 VITALS — BP 127/75 | HR 55 | Resp 16

## 2020-10-11 DIAGNOSIS — G20 Parkinson's disease: Secondary | ICD-10-CM | POA: Diagnosis not present

## 2020-10-11 DIAGNOSIS — R2681 Unsteadiness on feet: Secondary | ICD-10-CM | POA: Diagnosis not present

## 2020-10-11 DIAGNOSIS — K5900 Constipation, unspecified: Secondary | ICD-10-CM | POA: Diagnosis not present

## 2020-10-11 DIAGNOSIS — C61 Malignant neoplasm of prostate: Secondary | ICD-10-CM | POA: Diagnosis not present

## 2020-10-11 DIAGNOSIS — R5383 Other fatigue: Secondary | ICD-10-CM | POA: Diagnosis not present

## 2020-10-11 DIAGNOSIS — Z9181 History of falling: Secondary | ICD-10-CM | POA: Diagnosis not present

## 2020-10-11 DIAGNOSIS — M6281 Muscle weakness (generalized): Secondary | ICD-10-CM | POA: Diagnosis not present

## 2020-10-11 DIAGNOSIS — N183 Chronic kidney disease, stage 3 unspecified: Secondary | ICD-10-CM | POA: Diagnosis not present

## 2020-10-11 DIAGNOSIS — C7951 Secondary malignant neoplasm of bone: Secondary | ICD-10-CM

## 2020-10-11 DIAGNOSIS — M8088XA Other osteoporosis with current pathological fracture, vertebra(e), initial encounter for fracture: Secondary | ICD-10-CM

## 2020-10-11 DIAGNOSIS — Z743 Need for continuous supervision: Secondary | ICD-10-CM | POA: Diagnosis not present

## 2020-10-11 MED ORDER — SODIUM CHLORIDE 0.9 % IV SOLN
Freq: Once | INTRAVENOUS | Status: AC
  Filled 2020-10-11: qty 250

## 2020-10-11 MED ORDER — ZOLEDRONIC ACID 5 MG/100ML IV SOLN
5.0000 mg | Freq: Once | INTRAVENOUS | Status: AC
  Administered 2020-10-11: 5 mg via INTRAVENOUS
  Filled 2020-10-11: qty 100

## 2020-10-11 NOTE — Progress Notes (Signed)
[place] NOTE  Patient Care Team: Juline Patch, MD as PCP - General (Family Medicine) Tat, Eustace Quail, DO as Consulting Physician (Neurology) Cammie Sickle, MD as Consulting Physician (Hematology and Oncology)  CHIEF COMPLAINTS/PURPOSE OF CONSULTATION: PROSTATE CANCER  #  Oncology History Overview Note  # PROSTATE CANCER- METATSTATIC to BONE; PSA- 26.5. Sclerotic 1.5 cm left ischial lesion/ Sclerotic medial left iliac bone 1.5 cm lesion (series 2/image 47), increased from 1.0 cm. Gleason score of 4+5= 9; with almost all cores involved greater than 80%.  9/16 Lupron 11-month depot on 9/16. [Urology; Dr.Siniski]  # MID OCT 2020- Zytiga 1000 mg+ prednisone; stopped December 2021 [poor tolerance if with RVR]  # Parkinsons's syndrome [Dr.Tat; [place]; neurologist]; CKD-III [creat1.3-1.5]  # GC- referred  # DECLINES- Palliative care [316/2021]  DIAGNOSIS: Prostate cancer  STAGE:     4    ;  GOALS: Palliative/control  CURRENT/MOST RECENT THERAPY : Lupron+ Zytiga      Prostate cancer metastatic to bone ([place])  05/24/2019 Initial Diagnosis   Prostate cancer metastatic to bone ([facility])    HISTORY OF PRESENTING ILLNESS:  [name] [name] 80 y.o.  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   Patient's Zytiga plus prednisone is on hold since December 2021 because of poor tolerance/atrial fib with RVR.  In the interim patient underwent kyphoplasty for his compression fracture.  Currently improved.  Overall he continues to complain of fatigue.  Review of Systems  Constitutional: Positive for malaise/fatigue. Negative for chills, diaphoresis, fever and weight loss.  HENT: Negative for nosebleeds and sore throat.   Eyes: Negative for double vision.  Respiratory: Negative for cough, hemoptysis, sputum production, shortness of breath and wheezing.    Cardiovascular: Negative for chest pain, palpitations, orthopnea and leg swelling.  Gastrointestinal: Positive for constipation. Negative for abdominal pain, blood in stool, diarrhea, heartburn, melena, nausea and vomiting.  Genitourinary: Positive for frequency and urgency. Negative for dysuria.  Musculoskeletal: Negative for back pain and joint pain.  Skin: Negative.  Negative for itching and rash.  Neurological: Positive for tremors. Negative for dizziness, tingling, focal weakness, weakness and headaches.  Endo/Heme/Allergies: Does not bruise/bleed easily.  Psychiatric/Behavioral: Negative for depression. The patient is not nervous/anxious and does not have insomnia.     MEDICAL HISTORY:  Past Medical History:  Diagnosis Date  . Atrial flutter ([place])   . Cancer ([place])    PROSTATE  . Parkinson's disease ([place])   . REM sleep behavior disorder   . Sleep apnea    CPAP    SURGICAL HISTORY: Past Surgical History:  Procedure Laterality Date  . APPENDECTOMY    . 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 KYPHOPLASTY N/A 08/15/2020   Procedure: L2 compression fracture;  Surgeon: Hessie Knows, MD;  Location: [facility] ORS;  Service: Orthopedics;  Laterality: N/A;  . KYPHOPLASTY N/A 08/29/2020   Procedure: T8 KYPHOPLASTY;  Surgeon: Hessie Knows, MD;  Location: [facility] ORS;  Service: Orthopedics;  Laterality: N/A;    SOCIAL HISTORY: Social History   Socioeconomic History  . Marital status: Married    Spouse name: Not on file  . Number of children: Not on file  . Years of education: Not on file  . Highest education level: Not on file  Occupational History  . Occupation: retired    Comment: Nutritional therapist  Tobacco Use  . Smoking status: Former Smoker    Years: 4.00  Types: Cigars    Quit date: 09/07/2010    Years since quitting: 10.1  . Smokeless tobacco: Never Used  . Tobacco comment: 2 CIGARS PER DAY  Vaping Use  . Vaping Use: Never used  Substance and Sexual Activity  . Alcohol use:  Not Currently    Comment: 1 can beer/day  . Drug use: Never  . Sexual activity: Not Currently  Other Topics Concern  . Not on file  Social History Narrative   Lives in [place]; quit smoking in 2012; ocassional alcohol; Camera operator; wife; 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    Social Determinants of Health   Financial Resource Strain: Not on file  Food Insecurity: Not on file  Transportation Needs: Not on file  Physical Activity: Not on file  Stress: Not on file  Social Connections: Not on file  Intimate Partner Violence: Not on file    FAMILY HISTORY: Family History  Problem Relation Age of Onset  . Heart disease Maternal Grandfather   . Diabetes Paternal Grandfather   . Breast cancer Daughter     ALLERGIES:  is allergic to influenza vaccines.  MEDICATIONS:  Current Outpatient Medications  Medication Sig Dispense Refill  . apixaban (ELIQUIS) 5 MG TABS tablet Take 1 tablet (5 mg total) by mouth 2 (two) times daily. 60 tablet 1  . carbidopa-levodopa (SINEMET CR) 50-200 MG tablet Take 1 tablet by mouth at bedtime. 90 tablet 1  . carbidopa-levodopa (SINEMET IR) 25-100 MG tablet 3 tablets at 7 AM/2 at 11 AM/2 tablets at 3 PM/2 tablet at 7 PM 810 tablet 1  . clonazePAM (KLONOPIN) 0.5 MG tablet Take 0.5 mg by mouth at bedtime. Dr Lisabeth Devoid    . diltiazem (CARDIZEM CD) 120 MG 24 hr capsule Take 1 capsule (120 mg total) by mouth daily. 30 capsule 2  . gabapentin (NEURONTIN) 100 MG capsule Take 1 capsule by mouth 2 (two) times daily. Then bridge to three times a day    . Glycerin-Hypromellose-PEG 400 (VISINE DRY EYE OP) Place 1 drop into both eyes 2 (two) times daily as needed (eye irritation).     Marland Kitchen ketoconazole (NIZORAL) 2 % shampoo Apply 1 application topically once a week.     Marland Kitchen oxycodone (OXY-IR) 5 MG capsule Take 1 capsule (5 mg total) by mouth every 6 (six) hours as needed for pain. 30 capsule 0   No current facility-administered medications for this visit.   Facility-Administered  Medications Ordered in Other Visits  Medication Dose Route Frequency Provider Last Rate Last Admin  . 0.9 %  sodium chloride infusion   Intravenous Once Charlaine Dalton R, MD      . zoledronic acid (RECLAST) injection 5 mg  5 mg Intravenous Once Charlaine Dalton R, MD          .  PHYSICAL EXAMINATION: ECOG PERFORMANCE STATUS: 0 - Asymptomatic  Vitals:   10/11/20 1430  BP: 106/70  Pulse: 60  Resp: 20  Temp: 98.2 F (36.8 C)   There were no vitals filed for this visit.  Physical Exam Constitutional:      Comments: Frail-appearing Caucasian male patient.  Ambulating independently.  Alone.  HENT:     Head: Normocephalic and atraumatic.     Mouth/Throat:     Pharynx: No oropharyngeal exudate.  Eyes:     Pupils: Pupils are equal, round, and reactive to light.  Cardiovascular:     Rate and Rhythm: Normal rate and regular rhythm.  Pulmonary:     Effort: No respiratory distress.  Breath sounds: No wheezing.  Abdominal:     General: Bowel sounds are normal. There is no distension.     Palpations: Abdomen is soft. There is no mass.     Tenderness: There is no abdominal tenderness. There is no guarding or rebound.  Musculoskeletal:        General: No tenderness. Normal range of motion.     Cervical back: Normal range of motion and neck supple.  Skin:    General: Skin is warm.  Neurological:     Mental Status: He is alert and oriented to person, place, and time.     Comments: Tremor of right upper extremity.  Psychiatric:        Mood and Affect: Affect normal.      LABORATORY DATA:  I have reviewed the data as listed Lab Results  Component Value Date   WBC 6.0 10/09/2020   HGB 12.2 (L) 10/09/2020   HCT 38.1 (L) 10/09/2020   MCV 91.1 10/09/2020   PLT 214 10/09/2020   Recent Labs    02/12/20 1053 03/25/20 1048 05/06/20 0935 07/05/20 1132 08/08/20 0934 10/09/20 1345  NA 138 138 142 140 139 139  K 4.1 3.6 3.7 3.9 4.0 3.9  CL 102 104 108 107 100 103   CO2 27 25 25 25 27 26   GLUCOSE 132* 128* 108* 124* 93 107*  BUN 25* 21 21 21 23 23   CREATININE 1.77* 1.46* 1.60* 1.31* 1.52* 1.30*  CALCIUM 9.7 9.5 9.5 9.8 10.3 9.9  GFRNONAA 36* 45* 41* 56* 47* 56*  GFRAA 42* 53* 47*  --   --   --   PROT 6.9 6.5 6.7 7.1 7.4 7.1  ALBUMIN 4.0 3.8 3.9 4.0 4.1 3.8  AST 42* 28 31 33 30 29  ALT 6 8 6 8 8  <5  ALKPHOS 63 61 53 86 76 71  BILITOT 1.6* 1.8* 2.1* 2.0* 2.4* 1.0    RADIOGRAPHIC STUDIES: I have personally reviewed the radiological images as listed and agreed with the findings in the report. MR THORACIC SPINE W WO CONTRAST  Result Date: 10/04/2020 CLINICAL DATA:  No known injury.  Bilateral rib pain. EXAM: MRI THORACIC WITHOUT AND WITH CONTRAST TECHNIQUE: Multiplanar and multiecho pulse sequences of the thoracic spine were obtained without and with intravenous contrast. CONTRAST:  33mL GADAVIST GADOBUTROL 1 MMOL/ML IV SOLN COMPARISON:  08/24/2020 FINDINGS: Alignment:  No static listhesis.  Relative kyphosis at T8. Vertebrae: T7 vertebral body compression fracture with 80% anterior height loss and severe marrow edema throughout the vertebral body as well as 2 mm of retropulsion of the inferior posterior vertebral body. T8 vertebral body compression fracture with 50% height loss status post augmentation with methylmethacrylate within the vertebral body and 3 mm of retropulsion of the inferior posterior margin of the T8 vertebral body. Mild T9 vertebral body compression fracture with approximately 10% height loss and marrow edema along the superior endplate. Remainder of the vertebral body heights are maintained. No aggressive osseous lesion.  No discitis or osteomyelitis. Cord:  Normal signal and morphology. Paraspinal and other soft tissues: No acute paraspinal abnormality. Mild paravertebral soft tissue edema from T7 through T9. Disc levels: Disc spaces: Degenerative disease with disc height loss at T7-8 and T8-9. T1-T2: No disc protrusion, foraminal stenosis  or central canal stenosis. T2-T3: No disc protrusion, foraminal stenosis or central canal stenosis. T3-T4: No disc protrusion, foraminal stenosis or central canal stenosis. T4-T5: No disc protrusion, foraminal stenosis or central canal stenosis. T5-T6: No disc  protrusion, foraminal stenosis or central canal stenosis. T6-T7: No disc protrusion, foraminal stenosis or central canal stenosis. T7-T8: No disc protrusion, foraminal stenosis or central canal stenosis. T8-T9: No disc protrusion. Right foraminal narrowing at T8-9. No central canal stenosis. T9-T10: No disc protrusion, foraminal stenosis or central canal stenosis. T10-T11: No disc protrusion, foraminal stenosis or central canal stenosis. T11-T12: No disc protrusion, foraminal stenosis or central canal stenosis. IMPRESSION: 1. Acute T7 vertebral body compression fracture with 80% anterior height loss and 2 mm of retropulsion of the inferior posterior vertebral body. 2. Acute T9 vertebral body compression fracture with approximately 10% height loss and marrow edema along the superior endplate. 3. T8 vertebral body compression fracture with 50% height loss status post augmentation with methylmethacrylate within the vertebral body and 3 mm of retropulsion of the inferior posterior margin of the T8 vertebral body. Persistent residual bone marrow edema in the T8 vertebral body. 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 On Eligard [urology/ q37m;last Jan 2022]  JAN 2022. JAN 2022-0.20 trending down- STABLE.  #Hold Zytiga plus prednisone at this time-see regarding A. Fib/RVR. Patient has poor tolerance I think is reasonable to hold off Zytiga plus prednisone-especially with the oligometastatic disease.  #A. Fib-chronic.  Rate controlled.  Continue  Eliquis.  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  Proceed with Reclast.  # DISPOSITION: # Reclast today.  # follow up in 2 months - -MD; Labs-1-2 day prior- cbc/cmp/PSA;  -Dr.B   All questions were answered. The patient knows to call the clinic with any problems, questions or concerns.    Cammie Sickle, MD 10/11/2020 3:12 PM

## 2020-10-11 NOTE — Telephone Encounter (Unsigned)
Copied from [place] [phone]. Topic: General - Other >> Oct 11, 2020  7:50 AM Hinda Lenis D wrote: PT returning call from [facility] / please advise

## 2020-10-11 NOTE — Progress Notes (Signed)
Patient tolerated infusion well. Patient and VSS. Discharged home  

## 2020-10-11 NOTE — Assessment & Plan Note (Addendum)
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 On Eligard [urology/ q80m;last Jan 2022]  JAN 2022. JAN 2022-0.20 trending down- STABLE.  #Hold Zytiga plus prednisone at this time-see regarding A. Fib/RVR. Patient has poor tolerance I think is reasonable to hold off Zytiga plus prednisone-especially with the oligometastatic disease.  #A. Fib-chronic.  Rate controlled.  Continue Eliquis.  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  Proceed with Reclast.  # DISPOSITION: # Reclast today.  # follow up in 2 months - -MD; Labs-1-2 day prior- cbc/cmp/PSA;  -Dr.B

## 2020-10-11 NOTE — Telephone Encounter (Signed)
I called pt back and discussed virtual appt next week

## 2020-10-23 DIAGNOSIS — Z9181 History of falling: Secondary | ICD-10-CM | POA: Diagnosis not present

## 2020-10-23 DIAGNOSIS — C61 Malignant neoplasm of prostate: Secondary | ICD-10-CM | POA: Diagnosis not present

## 2020-10-23 DIAGNOSIS — R2681 Unsteadiness on feet: Secondary | ICD-10-CM | POA: Diagnosis not present

## 2020-10-23 DIAGNOSIS — M6281 Muscle weakness (generalized): Secondary | ICD-10-CM | POA: Diagnosis not present

## 2020-10-23 DIAGNOSIS — S22060D Wedge compression fracture of T7-T8 vertebra, subsequent encounter for fracture with routine healing: Secondary | ICD-10-CM | POA: Diagnosis not present

## 2020-10-23 DIAGNOSIS — G20 Parkinson's disease: Secondary | ICD-10-CM | POA: Diagnosis not present

## 2020-10-23 DIAGNOSIS — Z743 Need for continuous supervision: Secondary | ICD-10-CM | POA: Diagnosis not present

## 2020-10-24 DIAGNOSIS — G20 Parkinson's disease: Secondary | ICD-10-CM | POA: Diagnosis not present

## 2020-10-24 DIAGNOSIS — Z743 Need for continuous supervision: Secondary | ICD-10-CM | POA: Diagnosis not present

## 2020-10-24 DIAGNOSIS — C61 Malignant neoplasm of prostate: Secondary | ICD-10-CM | POA: Diagnosis not present

## 2020-10-24 DIAGNOSIS — S22060D Wedge compression fracture of T7-T8 vertebra, subsequent encounter for fracture with routine healing: Secondary | ICD-10-CM | POA: Diagnosis not present

## 2020-10-24 DIAGNOSIS — R2681 Unsteadiness on feet: Secondary | ICD-10-CM | POA: Diagnosis not present

## 2020-10-24 DIAGNOSIS — Z9181 History of falling: Secondary | ICD-10-CM | POA: Diagnosis not present

## 2020-10-29 DIAGNOSIS — S22000A Wedge compression fracture of unspecified thoracic vertebra, initial encounter for closed fracture: Secondary | ICD-10-CM | POA: Diagnosis not present

## 2020-10-29 DIAGNOSIS — M546 Pain in thoracic spine: Secondary | ICD-10-CM | POA: Diagnosis not present

## 2020-10-29 DIAGNOSIS — R2681 Unsteadiness on feet: Secondary | ICD-10-CM | POA: Diagnosis not present

## 2020-10-29 DIAGNOSIS — M5414 Radiculopathy, thoracic region: Secondary | ICD-10-CM | POA: Diagnosis not present

## 2020-10-29 DIAGNOSIS — C61 Malignant neoplasm of prostate: Secondary | ICD-10-CM | POA: Diagnosis not present

## 2020-10-29 DIAGNOSIS — Z743 Need for continuous supervision: Secondary | ICD-10-CM | POA: Diagnosis not present

## 2020-10-29 DIAGNOSIS — S22060D Wedge compression fracture of T7-T8 vertebra, subsequent encounter for fracture with routine healing: Secondary | ICD-10-CM | POA: Diagnosis not present

## 2020-10-29 DIAGNOSIS — Z9181 History of falling: Secondary | ICD-10-CM | POA: Diagnosis not present

## 2020-10-29 DIAGNOSIS — G20 Parkinson's disease: Secondary | ICD-10-CM | POA: Diagnosis not present

## 2020-10-31 ENCOUNTER — Other Ambulatory Visit: Payer: Self-pay | Admitting: Urology

## 2020-10-31 DIAGNOSIS — G4733 Obstructive sleep apnea (adult) (pediatric): Secondary | ICD-10-CM | POA: Diagnosis not present

## 2020-10-31 DIAGNOSIS — Z23 Encounter for immunization: Secondary | ICD-10-CM | POA: Diagnosis not present

## 2020-10-31 DIAGNOSIS — N400 Enlarged prostate without lower urinary tract symptoms: Secondary | ICD-10-CM

## 2020-10-31 DIAGNOSIS — R002 Palpitations: Secondary | ICD-10-CM | POA: Diagnosis not present

## 2020-10-31 DIAGNOSIS — Z9989 Dependence on other enabling machines and devices: Secondary | ICD-10-CM | POA: Diagnosis not present

## 2020-10-31 DIAGNOSIS — G20 Parkinson's disease: Secondary | ICD-10-CM | POA: Diagnosis not present

## 2020-10-31 DIAGNOSIS — I483 Typical atrial flutter: Secondary | ICD-10-CM | POA: Diagnosis not present

## 2020-11-04 DIAGNOSIS — S32010A Wedge compression fracture of first lumbar vertebra, initial encounter for closed fracture: Secondary | ICD-10-CM | POA: Diagnosis not present

## 2020-11-04 DIAGNOSIS — S22060A Wedge compression fracture of T7-T8 vertebra, initial encounter for closed fracture: Secondary | ICD-10-CM | POA: Diagnosis not present

## 2020-11-04 DIAGNOSIS — S22070G Wedge compression fracture of T9-T10 vertebra, subsequent encounter for fracture with delayed healing: Secondary | ICD-10-CM | POA: Diagnosis not present

## 2020-11-04 DIAGNOSIS — I700 Atherosclerosis of aorta: Secondary | ICD-10-CM | POA: Diagnosis not present

## 2020-11-05 ENCOUNTER — Other Ambulatory Visit: Payer: Self-pay | Admitting: Orthopedic Surgery

## 2020-11-05 DIAGNOSIS — Z79899 Other long term (current) drug therapy: Secondary | ICD-10-CM | POA: Diagnosis not present

## 2020-11-05 DIAGNOSIS — M546 Pain in thoracic spine: Secondary | ICD-10-CM | POA: Diagnosis not present

## 2020-11-05 DIAGNOSIS — M5414 Radiculopathy, thoracic region: Secondary | ICD-10-CM | POA: Diagnosis not present

## 2020-11-08 ENCOUNTER — Other Ambulatory Visit: Payer: Self-pay

## 2020-11-08 ENCOUNTER — Encounter
Admission: RE | Admit: 2020-11-08 | Discharge: 2020-11-08 | Disposition: A | Discharge status: Home / Self Care | Payer: Medicare Other | Source: Ambulatory Visit | Attending: Orthopedic Surgery | Admitting: Orthopedic Surgery

## 2020-11-08 DIAGNOSIS — C61 Malignant neoplasm of prostate: Secondary | ICD-10-CM | POA: Diagnosis not present

## 2020-11-08 DIAGNOSIS — S22060D Wedge compression fracture of T7-T8 vertebra, subsequent encounter for fracture with routine healing: Secondary | ICD-10-CM | POA: Diagnosis not present

## 2020-11-08 DIAGNOSIS — G20 Parkinson's disease: Secondary | ICD-10-CM | POA: Diagnosis not present

## 2020-11-08 DIAGNOSIS — R2681 Unsteadiness on feet: Secondary | ICD-10-CM | POA: Diagnosis not present

## 2020-11-08 DIAGNOSIS — Z743 Need for continuous supervision: Secondary | ICD-10-CM | POA: Diagnosis not present

## 2020-11-08 DIAGNOSIS — Z9181 History of falling: Secondary | ICD-10-CM | POA: Diagnosis not present

## 2020-11-08 HISTORY — DX: Secondary malignant neoplasm of bone: C61

## 2020-11-08 HISTORY — DX: Malignant neoplasm of prostate: C79.51

## 2020-11-08 HISTORY — DX: Other osteoporosis with current pathological fracture, vertebra(e), initial encounter for fracture: M80.88XA

## 2020-11-08 HISTORY — DX: Benign prostatic hyperplasia without lower urinary tract symptoms: N40.0

## 2020-11-08 NOTE — Patient Instructions (Addendum)
Your procedure is scheduled on: Tuesday, March 8 Report to the Registration Desk on the 1st floor of the [facility]. To find out your arrival time, please call [phone] between 1PM - 3PM on: Monday, March 7  REMEMBER: Instructions that are not followed completely may result in serious medical risk, up to and including death; or upon the discretion of your surgeon and anesthesiologist your surgery may need to be rescheduled.  Do not eat food after midnight the night before surgery.  No gum chewing, lozengers or hard candies.  You may however, drink CLEAR liquids up to 2 hours before you are scheduled to arrive for your surgery. Do not drink anything within 2 hours of your scheduled arrival time.  Clear liquids include: - water  - apple juice without pulp - gatorade (not RED, PURPLE, OR BLUE) - black coffee or tea (Do NOT add milk or creamers to the coffee or tea) Do NOT drink anything that is not on this list.  In addition, your doctor has ordered for you to drink the provided  Ensure Pre-Surgery Clear Carbohydrate Drink  Drinking this carbohydrate drink up to two hours before surgery helps to reduce insulin resistance and improve patient outcomes. Please complete drinking 2 hours prior to scheduled arrival time.  TAKE THESE MEDICATIONS THE MORNING OF SURGERY WITH A SIP OF WATER:  1.  Carbidopa-sinemet 2.  Diltiazem 3.  Gabapentin 4.  Oxycodone if needed for pain  Follow recommendations from Cardiologist regarding stopping Eliquis. Per patient, was told to stop 3 days prior to surgery. Last day to take is Friday, March 4. Resume after surgery per instructed by surgeon.  One week prior to surgery: Stop Anti-inflammatories (NSAIDS) such as Advil, Aleve, Ibuprofen, Motrin, Naproxen, Naprosyn and Aspirin based products such as Excedrin, Goodys Powder, BC Powder. Stop ANY OVER THE COUNTER supplements until after surgery.  No Alcohol for 24 hours before or after surgery.  No  Smoking including e-cigarettes for 24 hours prior to surgery.  No chewable tobacco products for at least 6 hours prior to surgery.  No nicotine patches on the day of surgery.  Do not use any "recreational" drugs for at least a week prior to your surgery.  Please be advised that the combination of cocaine and anesthesia may have negative outcomes, up to and including death. If you test positive for cocaine, your surgery will be cancelled.  On the morning of surgery brush your teeth with toothpaste and water, you may rinse your mouth with mouthwash if you wish. Do not swallow any toothpaste or mouthwash.  Do not wear jewelry, make-up, hairpins, clips or nail polish.  Do not wear lotions, powders, or perfumes.   Do not shave body from the neck down 48 hours prior to surgery just in case you cut yourself which could leave a site for infection.  Also, freshly shaved skin may become irritated if using the CHG soap.  Contact lenses, hearing aids and dentures may not be worn into surgery.  Do not bring valuables to the hospital. [facility] is not responsible for any missing/lost belongings or valuables.   Use CHG Soap as directed on instruction sheet.  Bring your BI-PAP to the hospital with you in case you may have to spend the night.   Notify your doctor if there is any change in your medical condition (cold, fever, infection).  Wear comfortable clothing (specific to your surgery type) to the hospital.  Plan for stool softeners for home use; pain medications  have a tendency to cause constipation. You can also help prevent constipation by eating foods high in fiber such as fruits and vegetables and drinking plenty of fluids as your diet allows.  After surgery, you can help prevent lung complications by doing breathing exercises.  Take deep breaths and cough every 1-2 hours. Your doctor may order a device called an Incentive Spirometer to help you take deep breaths.  If you are being  discharged the day of surgery, you will not be allowed to drive home. You will need a responsible adult (18 years or older) to drive you home and stay with you that night.   If you are taking public transportation, you will need to have a responsible adult (18 years or older) with you. Please confirm with your physician that it is acceptable to use public transportation.   Please call the [place] Dept. at [phone] if you have any questions about these instructions.  Surgery Visitation Policy:  Patients undergoing a surgery or procedure may have one family member or support person with them as long as that person is not COVID-19 positive or experiencing its symptoms.  That person may remain in the waiting area during the procedure.

## 2020-11-11 ENCOUNTER — Other Ambulatory Visit
Admission: RE | Admit: 2020-11-11 | Discharge: 2020-11-11 | Disposition: A | Discharge status: Home / Self Care | Payer: Medicare Other | Source: Ambulatory Visit | Attending: Orthopedic Surgery | Admitting: Orthopedic Surgery

## 2020-11-11 ENCOUNTER — Other Ambulatory Visit: Payer: Self-pay

## 2020-11-11 DIAGNOSIS — Z20822 Contact with and (suspected) exposure to covid-19: Secondary | ICD-10-CM | POA: Insufficient documentation

## 2020-11-11 DIAGNOSIS — Z01812 Encounter for preprocedural laboratory examination: Secondary | ICD-10-CM | POA: Diagnosis not present

## 2020-11-11 LAB — SARS CORONAVIRUS 2 (TAT 6-24 HRS): SARS Coronavirus 2: NEGATIVE

## 2020-11-12 ENCOUNTER — Ambulatory Visit: Payer: Medicare Other | Admitting: Certified Registered Nurse Anesthetist

## 2020-11-12 ENCOUNTER — Encounter: Payer: Self-pay | Admitting: Orthopedic Surgery

## 2020-11-12 ENCOUNTER — Encounter
Admission: RE | Disposition: A | Discharge status: Home / Self Care | Payer: Self-pay | Source: Home / Self Care | Attending: Orthopedic Surgery

## 2020-11-12 ENCOUNTER — Other Ambulatory Visit: Payer: Self-pay

## 2020-11-12 ENCOUNTER — Ambulatory Visit: Payer: Medicare Other

## 2020-11-12 ENCOUNTER — Ambulatory Visit
Admission: RE | Admit: 2020-11-12 | Discharge: 2020-11-12 | Disposition: A | Discharge status: Home / Self Care | Payer: Medicare Other | Attending: Orthopedic Surgery | Admitting: Orthopedic Surgery

## 2020-11-12 DIAGNOSIS — Z887 Allergy status to serum and vaccine status: Secondary | ICD-10-CM | POA: Insufficient documentation

## 2020-11-12 DIAGNOSIS — M4854XA Collapsed vertebra, not elsewhere classified, thoracic region, initial encounter for fracture: Secondary | ICD-10-CM | POA: Insufficient documentation

## 2020-11-12 DIAGNOSIS — Z7901 Long term (current) use of anticoagulants: Secondary | ICD-10-CM | POA: Insufficient documentation

## 2020-11-12 DIAGNOSIS — M8008XA Age-related osteoporosis with current pathological fracture, vertebra(e), initial encounter for fracture: Secondary | ICD-10-CM | POA: Diagnosis not present

## 2020-11-12 DIAGNOSIS — Z87892 Personal history of anaphylaxis: Secondary | ICD-10-CM | POA: Insufficient documentation

## 2020-11-12 DIAGNOSIS — Z419 Encounter for procedure for purposes other than remedying health state, unspecified: Secondary | ICD-10-CM

## 2020-11-12 DIAGNOSIS — Z981 Arthrodesis status: Secondary | ICD-10-CM | POA: Diagnosis not present

## 2020-11-12 DIAGNOSIS — M4856XA Collapsed vertebra, not elsewhere classified, lumbar region, initial encounter for fracture: Secondary | ICD-10-CM | POA: Insufficient documentation

## 2020-11-12 DIAGNOSIS — S32010A Wedge compression fracture of first lumbar vertebra, initial encounter for closed fracture: Secondary | ICD-10-CM | POA: Diagnosis not present

## 2020-11-12 DIAGNOSIS — I4892 Unspecified atrial flutter: Secondary | ICD-10-CM | POA: Diagnosis not present

## 2020-11-12 DIAGNOSIS — Z79899 Other long term (current) drug therapy: Secondary | ICD-10-CM | POA: Diagnosis not present

## 2020-11-12 DIAGNOSIS — G20 Parkinson's disease: Secondary | ICD-10-CM | POA: Diagnosis not present

## 2020-11-12 DIAGNOSIS — I509 Heart failure, unspecified: Secondary | ICD-10-CM | POA: Diagnosis not present

## 2020-11-12 HISTORY — PX: KYPHOPLASTY: SHX5884

## 2020-11-12 SURGERY — KYPHOPLASTY
Anesthesia: General

## 2020-11-12 MED ORDER — FAMOTIDINE 20 MG PO TABS
20.0000 mg | ORAL_TABLET | Freq: Once | ORAL | Status: AC
  Administered 2020-11-12: 20 mg via ORAL

## 2020-11-12 MED ORDER — FENTANYL CITRATE (PF) 100 MCG/2ML IJ SOLN: 25.0000 ug | INTRAMUSCULAR | Status: DC | PRN

## 2020-11-12 MED ORDER — OXYCODONE HCL 5 MG PO TABS
5.0000 mg | ORAL_TABLET | Freq: Once | ORAL | Status: AC
Start: 2020-11-12 — End: 2020-11-12
  Administered 2020-11-12: 5 mg via ORAL

## 2020-11-12 MED ORDER — FAMOTIDINE 20 MG PO TABS
ORAL_TABLET | ORAL | Status: AC
  Filled 2020-11-12: qty 1

## 2020-11-12 MED ORDER — PROPOFOL 500 MG/50ML IV EMUL
INTRAVENOUS | Status: DC | PRN
  Administered 2020-11-12: 25 ug/kg/min via INTRAVENOUS

## 2020-11-12 MED ORDER — CEFAZOLIN SODIUM-DEXTROSE 2-4 GM/100ML-% IV SOLN
2.0000 g | INTRAVENOUS | Status: AC
  Administered 2020-11-12: 2 g via INTRAVENOUS

## 2020-11-12 MED ORDER — DEXMEDETOMIDINE HCL 200 MCG/2ML IV SOLN
INTRAVENOUS | Status: DC | PRN
  Administered 2020-11-12 (×2): 4 ug via INTRAVENOUS
  Administered 2020-11-12: 8 ug via INTRAVENOUS
  Administered 2020-11-12: 4 ug via INTRAVENOUS

## 2020-11-12 MED ORDER — OXYCODONE HCL 5 MG PO TABS
ORAL_TABLET | ORAL | Status: AC
  Filled 2020-11-12: qty 1

## 2020-11-12 MED ORDER — ORAL CARE MOUTH RINSE: 15.0000 mL | Freq: Once | OROMUCOSAL | Status: AC

## 2020-11-12 MED ORDER — CARBIDOPA-LEVODOPA 25-100 MG PO TABS
2.0000 | ORAL_TABLET | Freq: Three times a day (TID) | ORAL | Status: DC
  Filled 2020-11-12: qty 2

## 2020-11-12 MED ORDER — KETAMINE HCL 50 MG/5ML IJ SOSY
PREFILLED_SYRINGE | INTRAMUSCULAR | Status: AC
  Filled 2020-11-12: qty 5

## 2020-11-12 MED ORDER — BUPIVACAINE-EPINEPHRINE (PF) 0.5% -1:200000 IJ SOLN
INTRAMUSCULAR | Status: AC
  Filled 2020-11-12: qty 90

## 2020-11-12 MED ORDER — PROPOFOL 10 MG/ML IV BOLUS
INTRAVENOUS | Status: AC
  Filled 2020-11-12: qty 20

## 2020-11-12 MED ORDER — CEFAZOLIN SODIUM-DEXTROSE 2-4 GM/100ML-% IV SOLN
INTRAVENOUS | Status: AC
  Filled 2020-11-12: qty 100

## 2020-11-12 MED ORDER — OXYCODONE HCL 5 MG PO CAPS: 5.0000 mg | ORAL_CAPSULE | Freq: Four times a day (QID) | ORAL | 0 refills | Status: DC | PRN

## 2020-11-12 MED ORDER — DEXMEDETOMIDINE (PRECEDEX) IN NS 20 MCG/5ML (4 MCG/ML) IV SYRINGE
PREFILLED_SYRINGE | INTRAVENOUS | Status: AC
  Filled 2020-11-12: qty 5

## 2020-11-12 MED ORDER — LACTATED RINGERS IV SOLN: INTRAVENOUS | Status: DC

## 2020-11-12 MED ORDER — LIDOCAINE HCL 1 % IJ SOLN
INTRAMUSCULAR | Status: DC | PRN
  Administered 2020-11-12: 8 mL
  Administered 2020-11-12: 20 mL

## 2020-11-12 MED ORDER — BUPIVACAINE-EPINEPHRINE (PF) 0.5% -1:200000 IJ SOLN
INTRAMUSCULAR | Status: DC | PRN
  Administered 2020-11-12: 20 mL

## 2020-11-12 MED ORDER — KETAMINE HCL 10 MG/ML IJ SOLN
INTRAMUSCULAR | Status: DC | PRN
  Administered 2020-11-12: 10 mg via INTRAVENOUS
  Administered 2020-11-12: 20 mg via INTRAVENOUS
  Administered 2020-11-12: 10 mg via INTRAVENOUS

## 2020-11-12 MED ORDER — LIDOCAINE HCL (PF) 1 % IJ SOLN
INTRAMUSCULAR | Status: AC
  Filled 2020-11-12: qty 60

## 2020-11-12 MED ORDER — CHLORHEXIDINE GLUCONATE 0.12 % MT SOLN
15.0000 mL | Freq: Once | OROMUCOSAL | Status: AC
  Administered 2020-11-12: 15 mL via OROMUCOSAL

## 2020-11-12 MED ORDER — CARBIDOPA-LEVODOPA 25-100 MG PO TABS
2.0000 | ORAL_TABLET | Freq: Once | ORAL | Status: AC
  Administered 2020-11-12: 2 via ORAL
  Filled 2020-11-12: qty 2

## 2020-11-12 MED ORDER — CHLORHEXIDINE GLUCONATE 0.12 % MT SOLN
OROMUCOSAL | Status: AC
  Filled 2020-11-12: qty 15

## 2020-11-12 SURGICAL SUPPLY — 21 items
ADH SKN CLS APL DERMABOND .7 (GAUZE/BANDAGES/DRESSINGS) ×1
CEMENT KYPHON CX01A KIT/MIXER (Cement) ×2 IMPLANT
COVER WAND RF STERILE (DRAPES) ×2 IMPLANT
DERMABOND ADVANCED (GAUZE/BANDAGES/DRESSINGS) ×1
DERMABOND ADVANCED .7 DNX12 (GAUZE/BANDAGES/DRESSINGS) ×1 IMPLANT
DEVICE BIOPSY BONE KYPHX (INSTRUMENTS) ×4 IMPLANT
DRAPE C-ARM XRAY 36X54 (DRAPES) ×2 IMPLANT
DURAPREP 26ML APPLICATOR (WOUND CARE) ×2 IMPLANT
GLOVE SURG SYN 9.0  PF PI (GLOVE) ×2
GLOVE SURG SYN 9.0 PF PI (GLOVE) ×1 IMPLANT
GOWN SRG 2XL LVL 4 RGLN SLV (GOWNS) ×1 IMPLANT
GOWN STRL NON-REIN 2XL LVL4 (GOWNS) ×2
GOWN STRL REUS W/ TWL LRG LVL3 (GOWN DISPOSABLE) ×1 IMPLANT
GOWN STRL REUS W/TWL LRG LVL3 (GOWN DISPOSABLE) ×2
MANIFOLD NEPTUNE II (INSTRUMENTS) ×2 IMPLANT
PACK KYPHOPLASTY (MISCELLANEOUS) ×2 IMPLANT
RENTAL RFA GENERATOR (MISCELLANEOUS) IMPLANT
STRAP SAFETY 5IN WIDE (MISCELLANEOUS) ×2 IMPLANT
SWABSTK COMLB BENZOIN TINCTURE (MISCELLANEOUS) ×2 IMPLANT
TRAY KYPHOPAK 15/3 EXPRESS 1ST (MISCELLANEOUS) IMPLANT
TRAY KYPHOPAK 20/3 EXPRESS 1ST (MISCELLANEOUS) ×2 IMPLANT

## 2020-11-12 NOTE — Anesthesia Preprocedure Evaluation (Signed)
Anesthesia Evaluation  Patient identified by MRN, date of birth, ID band Patient awake    Reviewed: Allergy & Precautions, NPO status , Patient's Chart, lab work & pertinent test results  History of Anesthesia Complications Negative for: history of anesthetic complications  Airway Mallampati: I       Dental  (+) Dental Advidsory Given, Poor Dentition   Pulmonary neg shortness of breath, sleep apnea , neg COPD, neg recent URI, Not current smoker, former smoker,           Cardiovascular (-) hypertension(-) angina+ Peripheral Vascular Disease and +CHF  (-) Past MI and (-) Cardiac Stents + dysrhythmias Atrial Fibrillation (-) Valvular Problems/Murmurs     Neuro/Psych neg Seizures Parkinson's disease negative neurological ROS     GI/Hepatic Neg liver ROS, neg GERD  ,  Endo/Other  negative endocrine ROSneg diabetes  Renal/GU negative Renal ROS     Musculoskeletal   Abdominal   Peds  Hematology Prostate cancer   Anesthesia Other Findings Past Medical History: No date: Atrial flutter (HCC) No date: BPH (benign prostatic hyperplasia) No date: Cancer [facility])     Comment:  PROSTATE 2015: Diverticulosis No date: Parkinson's disease ([place]) No date: 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 (HCC) No date: Prostate cancer metastatic to bone ([place]) No date: REM sleep behavior disorder No date: Sleep apnea     Comment:  BiPap   Reproductive/Obstetrics                             Anesthesia Physical  Anesthesia Plan  ASA: III  Anesthesia Plan: General   Post-op Pain Management:    Induction: Intravenous  PONV Risk Score and Plan: TIVA and Propofol infusion  Airway Management Planned: Nasal Cannula and Natural Airway  Additional Equipment:   Intra-op Plan:   Post-operative Plan:   Informed Consent: I have reviewed the patients History and Physical, chart,  labs and discussed the procedure including the risks, benefits and alternatives for the proposed anesthesia with the patient or authorized representative who has indicated his/her understanding and acceptance.       Plan Discussed with:   Anesthesia Plan Comments:         Anesthesia Quick Evaluation

## 2020-11-12 NOTE — Discharge Instructions (Addendum)
Take it easy today and then try to start walking all you can starting tomorrow. Remove Band-Aid Thursday then okay to shower Pain medicine as directed Call office if you are having problems   AMBULATORY SURGERY  DISCHARGE INSTRUCTIONS   1) The drugs that you were given will stay in your system until tomorrow so for the next 24 hours you should not:  A) Drive an automobile B) Make any legal decisions C) Drink any alcoholic beverage   2) You may resume regular meals tomorrow.  Today it is better to start with liquids and gradually work up to solid foods.  You may eat anything you prefer, but it is better to start with liquids, then soup and crackers, and gradually work up to solid foods.   3) Please notify your doctor immediately if you have any unusual bleeding, trouble breathing, redness and pain at the surgery site, drainage, fever, or pain not relieved by medication.  4) Your post-operative visit with Dr.                                     is: Date:                        Time:    Please call to schedule your post-operative visit.  5) Additional Instructions:

## 2020-11-12 NOTE — H&P (Signed)
History of the Present Illness: [name] [name] is a 80 y.o. male here today.   The patient presents for evaluation of thoracic compression fracture. He had an MRI on 10/04/2020 that was compared with x-ray from 08/24/2020 showing a severe T7 compression fracture with some retropulsion. They show had a prior T8 kyphoplasty and a mild T9 compression fracture, so acute T7 and T9 fractures.  The patient states his pain is terrible. He has not had an x-ray since his prior MRI on 10/04/2020. He denies having a fall. He states the pain was all over the place, but now it is across the belt line and in the center of his back depending on what he is doing. He is taking oxycodone for pain.  The patient states his wife has been in the hospital in [facility] for 2 weeks, and he has been going down there as much as he can.   The patient will be moving to [facility], [place] soon.  I have reviewed past medical, surgical, social and family history, and allergies as documented in the EMR.  Past Medical History: Past Medical History:  Diagnosis Date  . BPH (benign prostatic hyperplasia)  . Diverticulosis 06/05/14  . Parkinson disease (CMS-HCC) 2015  . 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 RBD (REM behavioral disorder) 2012  . Sleep apnea  BiPap   Past Surgical History: Past Surgical History:  Procedure Laterality Date  . APPENDECTOMY 1963  . COLONOSCOPY 10/04/2008  . COLONOSCOPY 06/05/14  REPEAT 5 YEARS PER MUS  . KYPHOPLASTY N/A 08/15/2020  Dr. Rudene Christians (L2)  . KYPHOPLASTY N/A 08/29/2020  Dr. Rudene Christians (T8)   Past Family History: Family History  Problem Relation Age of Onset  . Coronary Artery Disease (Blocked arteries around heart) Father  . Rheum arthritis Maternal Grandmother  . Diabetes type II Paternal Grandfather  . Myocardial Infarction (Heart attack) Maternal Grandfather  . Coronary  Artery Disease (Blocked arteries around heart) Maternal Grandfather  . Colon cancer Neg Hx  . Colon polyps Neg Hx  . Rectal cancer Neg Hx  . Liver disease Neg Hx  . Ulcers Neg Hx   Medications: Current Outpatient Medications Ordered in Epic  Medication Sig Dispense Refill  . triamcinolone 0.1 % cream Apply topically as directed  . abiraterone (ZYTIGA) 250 mg tablet Take 1,000 mg by mouth once daily (Patient not taking: Reported on 11/04/2020 )  . carbidopa-levodopa (SINEMET CR) 50-200 mg CR tablet Take 1 tablet by mouth nightly  . carbidopa-levodopa (SINEMET) 25-100 mg tablet TAKE 2 TABLETS THREE TIMES A DAY AROUND MEAL TIMES 540 tablet 0  . clonazePAM (KLONOPIN) 0.5 MG tablet Take 1 tablet (0.5 mg total) by mouth nightly for 180 days 90 tablet 1  . diltiazem (CARDIZEM CD) 120 MG XR capsule TAKE 1 CAPSULE DAILY 90 capsule 3  . ELIQUIS 5 mg tablet TAKE 1 TABLET TWICE A DAY 180 tablet 3  . gabapentin (NEURONTIN) 300 MG capsule 1 po tid 90 capsule 5  . ketoconazole (NIZORAL) 2 % shampoo Apply topically as needed  . methocarbamoL (ROBAXIN) 500 MG tablet 1/2-1 po qHS prn (Patient not taking: Reported on 11/04/2020 ) 30 tablet 5  . nystatin (MYCOSTATIN) 100,000 unit/mL suspension  . nystatin (MYCOSTATIN) 100,000 unit/mL suspension Use as directed 5 mLs (500,000 Units total) in the mouth or throat 4 (four) times daily. Swish and swallow 3 to 4 times a day  .  oxyCODONE (ROXICODONE) 5 MG immediate release tablet Take 1 tablet (5 mg total) by mouth every 8 (eight) hours as needed for Pain 90 tablet 0  . predniSONE (DELTASONE) 10 MG tablet Take 10 mg by mouth once daily (Patient not taking: Reported on 11/04/2020 )  . tamsulosin (FLOMAX) 0.4 mg capsule Take 0.4 mg by mouth once daily  . tetrahydroz-peg 400-hyprom-gly 0.05-1-0.36-0.2 % Drop Place 2 drops into both eyes as needed for Dry Eyes.   No current Epic-ordered facility-administered medications on file.   Allergies: Allergies  Allergen  Reactions  . Influenza Virus Vaccine Qv 1683-72(90 Thru 64 Yrs) Anaphylaxis  Flu shot: 1974 anaphylaxis. 2nd time swollen arm  . Others Anaphylaxis  Flu Shot    Body mass index is 22.4 kg/m.  Review of Systems: A comprehensive 14 point ROS was performed, reviewed, and the pertinent orthopaedic findings are documented in the HPI.  Vitals:  11/04/20 1405  BP: 102/64    General Physical Examination:   General/Constitutional: No apparent distress: well-nourished and well developed. Eyes: Pupils equal, round with synchronous movement. Lungs: Clear to auscultation HEENT: Normal Vascular: No edema, swelling or tenderness, except as noted in detailed exam. Cardiac: Heart rate and rhythm is regular. Integumentary: No impressive skin lesions present, except as noted in detailed exam. Neuro/Psych: Normal mood and affect, oriented to person, place and time.  Musculoskeletal Examination:  On exam, no tenderness to T7 or T9. Tenderness at L1.  Radiographs:  Lateral view of the thoracic spine shows approximately 70 percent compression at T7, and mild superior endplate compression at T9, with old T8 kyphoplasty. Comparison to prior x-ray, he has an old L2 compression fracture treated with kyphoplasty. Above this, L1 was not fractured in 08/2020, but is now 50 percent compressed. This is more in the area of his pain and maybe the symptomatic level.  Assessment: ICD-10-CM  1. Closed wedge compression fracture of L1 vertebra, initial encounter (CMS-HCC) S32.010A  2. Closed wedge compression fracture of T7 vertebra, initial encounter (CMS-HCC) S22.060A  3. Closed wedge compression fracture of T9 vertebra with delayed healing S22.070G  4. Atherosclerosis of aorta (CMS-HCC) I70.0   Plan:  The patient has clinical findings of severe T7 compression fracture with some retropulsion, prior T8 kyphoplasty, and mild T9 fracture without pain, as well as L1 fracture with pain.  We discussed the  patient's x-ray findings. I explained he has 3 fractures above the level of his pain, and I do not believe they need to be treated. The patient will be moving to [facility] soon, so he may do care there. Right now, he can not have anything done with his wife in the hospital. He can call in to schedule L1 kyphoplasty if it works better here, or he can have that done in [place].  Surgical Risks:  The nature of the condition and the proposed procedure has been reviewed in detail with the patient. Surgical versus non-surgical options and prognosis for recovery have been reviewed and the inherent risks and benefits of each have been discussed including the risks of infection, bleeding, injury to nerves/blood vessels/tendons, incomplete relief of symptoms, persisting pain and/or stiffness, loss of function, complex regional pain syndrome, failure of the procedure, as appropriate.  Attestation: I, Dawn Royse, am documenting for [employer], MD utilizing [place].   Reviewed  H+P. No changes noted.

## 2020-11-12 NOTE — Transfer of Care (Signed)
Immediate Anesthesia Transfer of Care Note  Patient: [name] [name]  Procedure(s) Performed: L1 KYPHOPLASTY (N/A )  Patient Location: PACU  Anesthesia Type:General  Level of Consciousness: sedated  Airway & Oxygen Therapy: Patient Spontanous Breathing  Post-op Assessment: Report given to RN and Post -op Vital signs reviewed and stable  Post vital signs: Reviewed and stable  Last Vitals:  Vitals Value Taken Time  BP 150/84 11/12/20 1448  Temp    Pulse 71 11/12/20 1451  Resp    SpO2 99 % 11/12/20 1451  Vitals shown include unvalidated device data.  Last Pain:  Vitals:   11/12/20 1154  TempSrc: Oral  PainSc: 0-No pain         Complications: No complications documented.

## 2020-11-12 NOTE — Op Note (Signed)
11/12/2020  2:44 PM  PATIENT:  [name] [name]   MRN: [id number]   PRE-OPERATIVE DIAGNOSIS:  closed wedge compression fracture of L1   POST-OPERATIVE DIAGNOSIS:  closed wedge compression fracture of L1   PROCEDURE:  Procedure(s): KYPHOPLASTY L1  SURGEON: Laurene Footman, MD   ASSISTANTS: None   ANESTHESIA:   local and MAC   EBL:  No intake/output data recorded.   BLOOD ADMINISTERED:none   DRAINS: none    LOCAL MEDICATIONS USED:  MARCAINE    and XYLOCAINE    SPECIMEN:   L1 vertebral body biopsy   DISPOSITION OF SPECIMEN:  Pathology   COUNTS:  YES   TOURNIQUET:  * No tourniquets in log *   IMPLANTS: Bone cement   DICTATION: .Dragon Dictation  patient was brought to the operating room and after adequate anesthesia was obtained the patient was placed prone.  C arm was brought in in good visualization of the affected level obtained on both AP and lateral projections.  After patient identification and timeout procedures were completed, local anesthetic was infiltrated with 10 cc 1% Xylocaine infiltrated subcutaneously.  This is done the area on the each side of the planned approach.  The back was then prepped and draped in the usual sterile manner and repeat timeout procedure carried out.  A spinal needle was brought down to the pedicle on the each side of  L1 and a 50-50 mix of 1% Xylocaine half percent Sensorcaine with epinephrine total of 20 cc injected on each side.  After allowing this to set a small incision was made and the trocar was advanced into the vertebral body in an extrapedicular fashion on the right side and this crossed the midline so second side was not required.  Biopsy was obtained Drilling was carried out balloon inserted with inflation to  for cc on the right.  When the cement was appropriate consistency 7 cc were injected on the right t into the vertebral body without extravasation, good fill superior to inferior endplates and from right to left sides along the  inferior endplate.  After the cement had set the trochar was removed and permanent C-arm views obtained.  The wound was closed with Dermabond followed by Band-Aid   PLAN OF CARE:  Discharge to home after recovery room   PATIENT DISPOSITION:  PACU - hemodynamically stable.

## 2020-11-13 NOTE — Anesthesia Postprocedure Evaluation (Signed)
Anesthesia Post Note  Patient: ROBT OKUDA  Procedure(s) Performed: L1 KYPHOPLASTY (N/A )  Patient location during evaluation: PACU Anesthesia Type: General Level of consciousness: awake and alert Pain management: pain level controlled Vital Signs Assessment: post-procedure vital signs reviewed and stable Respiratory status: spontaneous breathing, nonlabored ventilation, respiratory function stable and patient connected to nasal cannula oxygen Cardiovascular status: blood pressure returned to baseline and stable Postop Assessment: no apparent nausea or vomiting Anesthetic complications: no   No complications documented.   Last Vitals:  Vitals:   11/12/20 1546 11/12/20 1608  BP: (!) 152/94 (!) 165/92  Pulse:  64  Resp:  18  Temp: 37.1 C 36.4 C  SpO2:  98%    Last Pain:  Vitals:   11/12/20 1608  TempSrc: Temporal  PainSc: 3                  Martha Clan

## 2020-11-14 ENCOUNTER — Encounter: Payer: Self-pay | Admitting: Orthopedic Surgery

## 2020-11-14 LAB — SURGICAL PATHOLOGY

## 2020-11-15 NOTE — Progress Notes (Signed)
Assessment/Plan:   1.  Parkinsons Disease  -continue carbidopa/levodopa 25/100, 3 tablets at 7 AM, 2 tablets at 11 AM, 2 tablets at 3 PM, 2 tablets at 7 PM  -Continue carbidopa/levodopa 50/200 CR at bedtime  2.  RBD  -Continue clonazepam 0.5 mg at bedtime  3.  OSAS  -On BiPAP and followed by [facility] neurology.  4.  Atrial flutter  -On Eliquis  -On diltiazem  5.  Low blood pressure  -Due to combination of diltiazem and Parkinson's disease.  Following with cardiology in [place].  He is aware of blood pressure issues.  6.  Metastatic prostate cancer  -Following with oncology  7.  Compression fractures.  -Has had several, with recent kyphoplasty at the L1 level.  Has a physiatry appointment on Wednesday for pain management. Subjective:   [name] [name] was seen today in follow up for Parkinsons disease.  My previous records were reviewed prior to todays visit as well as outside records available to me. Pt denies falls.  Medication increased last visit, which helped.  Pt denies lightheadedness, near syncope.  No hallucinations.  Mood has been good.  Patient did therapy since last visit.  Those notes are reviewed.  Patient did have a thoracic compression fracture at the T7 level recently.  He also had L1 fracture.  He had kyphoplasty at the L1 level on March 8.  He is still having a lot of back pain.  Has a physiatry appointment on Wednesday.  Current prescribed movement disorder medications: carbidopa/levodopa 25/100, 3 tablets at 7 AM/2 at 11 AM/3 PM/2 tablet at 7 PM (increased last visit) carbidopa/levodopa 50/200 CR at bed (started last visit) Clonazepam 0.5 mg at bedtime   PREVIOUS MEDICATIONS: opicapone (took 2 weeks of samples and d/c)   ALLERGIES:   Allergies  Allergen Reactions  . Influenza Vaccines Anaphylaxis    Flu shot: 1974 anaphylaxis. 2nd time swollen arm    CURRENT MEDICATIONS:  Outpatient Encounter Medications as of 11/18/2020  Medication Sig  .  apixaban (ELIQUIS) 5 MG TABS tablet Take 1 tablet (5 mg total) by mouth 2 (two) times daily.  . carbidopa-levodopa (SINEMET CR) 50-200 MG tablet Take 1 tablet by mouth at bedtime.  . carbidopa-levodopa (SINEMET IR) 25-100 MG tablet 3 tablets at 7 AM/2 at 11 AM/2 tablets at 3 PM/2 tablet at 7 PM  . clonazePAM (KLONOPIN) 0.5 MG tablet Take 0.5 mg by mouth at bedtime. Dr Lisabeth Devoid  . diltiazem (CARDIZEM CD) 120 MG 24 hr capsule Take 1 capsule (120 mg total) by mouth daily.  Marland Kitchen gabapentin (NEURONTIN) 300 MG capsule Take 300 mg by mouth daily.  . Glycerin-Hypromellose-PEG 400 (VISINE DRY EYE OP) Place 1 drop into both eyes 2 (two) times daily as needed (eye irritation).   Marland Kitchen ketoconazole (NIZORAL) 2 % shampoo Apply 1 application topically once a week.   Marland Kitchen oxycodone (OXY-IR) 5 MG capsule Take 1 capsule (5 mg total) by mouth every 6 (six) hours as needed for pain.   No facility-administered encounter medications on file as of 11/18/2020.    Objective:   PHYSICAL EXAMINATION:    VITALS:   Vitals:   11/18/20 1351  BP: 108/78  Pulse: (!) 56  SpO2: 98%  Weight: 134 lb (60.8 kg)  Height: 5\' 9"  (1.753 m)    GEN:  The patient appears stated age and is in NAD. HEENT:  Normocephalic, atraumatic.  The mucous membranes are moist. The superficial temporal arteries are without ropiness or tenderness. CV:  RRR Lungs:  CTAB Neck/HEME:  There are no carotid bruits bilaterally.  Neurological examination:  Orientation: The patient is alert and oriented x3. Cranial nerves: There is good facial symmetry with facial hypomimia. The speech is fluent and clear. Soft palate rises symmetrically and there is no tongue deviation. Hearing is intact to conversational tone. Sensation: Sensation is intact to light touch throughout Motor: Strength is at least antigravity x4.  Movement examination: Tone: There is nl tone in the UE/LE Abnormal movements: mild dyskinesia in the L leg Coordination:  There is mild  decremation with RAM's, with any form of RAMS, including alternating supination and pronation of the forearm, hand opening and closing, finger taps, heel taps and toe taps, but this is much improved compared to previous Gait and Station: The patient arises pretty well out of the chair.  He is slow to arise.  He is wide-based with ambulation.  I have reviewed and interpreted the following labs independently    Chemistry      Component Value Date/Time   NA 139 10/09/2020 1345   K 3.9 10/09/2020 1345   CL 103 10/09/2020 1345   CO2 26 10/09/2020 1345   BUN 23 10/09/2020 1345   CREATININE 1.30 (H) 10/09/2020 1345      Component Value Date/Time   CALCIUM 9.9 10/09/2020 1345   ALKPHOS 71 10/09/2020 1345   AST 29 10/09/2020 1345   ALT <5 10/09/2020 1345   BILITOT 1.0 10/09/2020 1345       Lab Results  Component Value Date   WBC 6.0 10/09/2020   HGB 12.2 (L) 10/09/2020   HCT 38.1 (L) 10/09/2020   MCV 91.1 10/09/2020   PLT 214 10/09/2020    Lab Results  Component Value Date   TSH 1.217 08/16/2019     Total time spent on today's visit was 20 minutes, including both face-to-face time and nonface-to-face time.  Time included that spent on review of records (prior notes available to me/labs/imaging if pertinent), discussing treatment and goals, answering patient's questions and coordinating care.  Cc:  Juline Patch, MD

## 2020-11-18 ENCOUNTER — Other Ambulatory Visit: Payer: Self-pay

## 2020-11-18 ENCOUNTER — Encounter: Payer: Self-pay | Admitting: Neurology

## 2020-11-18 ENCOUNTER — Ambulatory Visit (INDEPENDENT_AMBULATORY_CARE_PROVIDER_SITE_OTHER): Payer: Medicare Other | Admitting: Neurology

## 2020-11-18 VITALS — BP 108/78 | HR 56 | Ht 69.0 in | Wt 134.0 lb

## 2020-11-18 DIAGNOSIS — G20 Parkinson's disease: Secondary | ICD-10-CM | POA: Diagnosis not present

## 2020-11-18 NOTE — Patient Instructions (Signed)
Online Resources for Power over Parkinson's Group March 2022  . Local [facility] Online Groups  o Power over Parkinson's Group :   - Power Over Parkinson's Patient Education Group will be Wednesday, March 9th at 2pm via Zoom.   - Upcoming Power over Parkinson's Meetings:  2nd Wednesdays of the month at 2 pm:       April 13th, May 11th - Contact Amy Marriott at [email or web address] if interested in participating in this online group o Parkinson's Care Partners Group:    3rd Mondays, Contact Sarah Chambers o Atypical Parkinsonian Patient Group:   4th Wednesdays, Contact Sarah Chambers o If you are interested in participating in these online groups with Sarah, please contact her directly for how to join those meetings.  Her contact information is [email or web address].  She will send you a link to join the Zoom meeting.  (Please note that Sarah Chambers , MSW, LCSW, has resigned her position at [facility] [employer], but will continue to lead the online groups temporarily)  . Parkinson Foundation:  www.parkinson.org o PD Health at Home continues:  Mindfulness Mondays, Expert Briefing Tuesdays, Wellness Wednesdays, Take Time Thursdays, Fitness Fridays -Listings for March 2022 are on the website o Upcoming Webinar:  Conversations about Complementary Therapies and PD.  Wednesday, March 2nd @ 1 pm o Upcoming Webinar:  Can We Put the Brakes on PD Progression?  Wednesday, April 6th @ 1 pm o Register for expert briefings (webinars) at [email or web address] o  Please check out their website to sign up for emails and see their full online offerings  . Michael [employer] Foundation:  www.michaeljfox.org  o Upcoming Webinar:   Trouble Sleeping?  What to Know about Acting out Dreams and other Sleep Issues.  Thursday, March 17th @ 12 noon o Check out additional information on their website to see their full online offerings  . [place] Phinney Foundation:  www.davisphinneyfoundation.org o Upcoming  Webinar:  Women and Parkinson's.  Tuesday, March 8th at 2 pm o Care Partner Monthly Meetup.  With Connie Carpenter Phinney.  First Tuesday of each month, 2 pm o Check out additional information to Live Well Today on their website  . Parkinson and Movement Disorders (PMD) Alliance:  www.pmdalliance.org o [employer]:  Online Education Events o Sign up for emails, which are sent weekly to give you updates on programming and online offerings   . Parkinson's Association of [place]:  www.parkinsonassociation.org o Information on online support groups, education events, and online exercises including Yoga, Parkinson's exercises and more-LOTS of information on links to PD resources and online events o Virtual Support Group through Parkinson's Association of [place]; next one is scheduled for Wednesday, December 11, 2020 at 2 pm. (These are typically scheduled for the 1st Wednesday of the month at 2 pm).  Visit website for details.  . Additional links for movement activities: o PWR! Moves Classes at [facility] Exercise [facility] HAVE RESUMED!  Wednesdays 10 and 11 am.  Contact Amy Marriott, PT [email or web address] or [phone] if interested o Here is a link to the PWR!Moves classes on Zoom from [place] Parkinson's Foundation - Daily Mon-Sat at 10:00. Via Zoom, FREE and open to all.  There is also a link below via Facebook if you use that platform. - https://www.parkinsonsmi.org/mpf-programs/exercise-and-movement-activities - https://www.facebook.com/ParkinsonsMI.org/posts/pwr-moves-exercise-class-parkinson-wellness-recovery-online-with-angee-ludwa-pt-/10156827878021813/ o Parkinson's Wellness Recovery (PWR! Moves)  www.pwr4life.org - Info on the PWR! Virtual Experience:  You will have access to our expertise through self-assessment, guided plans that start with the PD-specific fundamentals, educational content, tips, Q&A with an expert,   and a growing library of PD-specific  pre-recorded and live exercise classes of varying types and intensity - both physical and cognitive! If that is not enough, we offer 1:1 wellness consultations (in-person or virtual) to personalize your PWR! Virtual Experience.  - Check out the PWR! Move of the month on the Parkinson Wellness Recovery website:  https://www.pwr4life.org/pwr-move-of-the-month-4/ o Parkinson Foundation Fitness Fridays:  - As part of the PD Health @ Home program, this free video series focuses each week on one aspect of fitness designed to support people living with Parkinson's.  These weekly videos highlight the Parkinson Foundation recent fitness guidelines for people with Parkinson's disease. -  www.parkinson.org/understanding-parkinsons/coronavirus/PD-health-at-home/Fitness-Fridays o Dance for PD website is offering free, live-stream classes throughout the week, as well as links to digital library of classes:  https://danceforparkinsons.org/ o Dance for Parkinson's Class:  [employer] [place].  Free offering for people with Parkinson's and care partners; virtual class.  o For more information, contact [phone] or email Magalli Morana at [email or web address] o Virtual dance and Pilates for Parkinson's classes: Click on the Community Tab> Parkinson's Movement Initiative Tab.  To register for classes and for more information, visit www.americandancefestival.org and click the "community" tab.     o [employer] Parkinson's Cycling Classes  - [employer]: 1pm on Fridays-Live classes at [employer] (Contact Beth McKinney at [email or web address] or [phone]) - [employer]: Virtual Classes Mondays and Thursdays (contact Priscilla at [email or web address] or [phone])   o [place] [employer] - Three levels of classes are offered Tuesdays and Thursdays:  10:30 am,  12 noon & 1:45 pm at [employer] [facility].  - Active Stretching with Maria, New Class starting in  March, on Fridays - To observe a class or for  more information, call [phone] or email [email or web address] . [facility] [employer]: o Online Caregiver Education Opportunities:  www.well-springsolutions.org/caregiver-education/caregiver-support-group.  You may also contact Jodi Kolada at [email or web address] or [phone].   o Virtual Caregiver Retreat, Monday, February 21st, 3-5 pm o Powerful Tools for Caregivers, 6-wk series for caregivers, in-person, Thursdays March 10, 17, 24, 31 and April 7, 14, from 10:30-12:30 at [facility] Conference Room, [address]  Contact Jodi Kolada (see above) to register o [facility] Navigator:  Just1Navigator program, a free service to help individuals and families through the journey of determining care for older adults.  The "Navigator" is a social worker, Nicole Reynolds, who will speak with a prospective client and/or loved ones to provide an assessment of the situation and a set of recommendations for a personalized care plan -- all free of charge, and whether [facility] offers the needed service or not. If the need is not a service we provide, we are well-connected with reputable programs in town that we can refer you to.  www.well-springsolutions.org or to speak with the Navigator, call [phone].  

## 2020-11-20 DIAGNOSIS — M5136 Other intervertebral disc degeneration, lumbar region: Secondary | ICD-10-CM | POA: Diagnosis not present

## 2020-11-20 DIAGNOSIS — M62830 Muscle spasm of back: Secondary | ICD-10-CM | POA: Diagnosis not present

## 2020-11-20 DIAGNOSIS — M5489 Other dorsalgia: Secondary | ICD-10-CM | POA: Diagnosis not present

## 2020-11-22 DIAGNOSIS — S22060D Wedge compression fracture of T7-T8 vertebra, subsequent encounter for fracture with routine healing: Secondary | ICD-10-CM | POA: Diagnosis not present

## 2020-11-22 DIAGNOSIS — M6281 Muscle weakness (generalized): Secondary | ICD-10-CM | POA: Diagnosis not present

## 2020-11-22 DIAGNOSIS — R2681 Unsteadiness on feet: Secondary | ICD-10-CM | POA: Diagnosis not present

## 2020-11-22 DIAGNOSIS — G20 Parkinson's disease: Secondary | ICD-10-CM | POA: Diagnosis not present

## 2020-11-22 DIAGNOSIS — C61 Malignant neoplasm of prostate: Secondary | ICD-10-CM | POA: Diagnosis not present

## 2020-11-22 DIAGNOSIS — Z743 Need for continuous supervision: Secondary | ICD-10-CM | POA: Diagnosis not present

## 2020-11-22 DIAGNOSIS — Z9181 History of falling: Secondary | ICD-10-CM | POA: Diagnosis not present

## 2020-11-26 ENCOUNTER — Ambulatory Visit (INDEPENDENT_AMBULATORY_CARE_PROVIDER_SITE_OTHER): Payer: Medicare Other | Admitting: Family Medicine

## 2020-11-26 ENCOUNTER — Other Ambulatory Visit: Payer: Self-pay

## 2020-11-26 ENCOUNTER — Encounter: Payer: Self-pay | Admitting: Family Medicine

## 2020-11-26 VITALS — BP 108/78 | HR 60 | Ht 69.0 in | Wt 128.0 lb

## 2020-11-26 DIAGNOSIS — Z9889 Other specified postprocedural states: Secondary | ICD-10-CM | POA: Diagnosis not present

## 2020-11-26 DIAGNOSIS — L309 Dermatitis, unspecified: Secondary | ICD-10-CM | POA: Diagnosis not present

## 2020-11-26 MED ORDER — TRIAMCINOLONE ACETONIDE 0.1 % EX CREA: 1.0000 "application " | TOPICAL_CREAM | Freq: Two times a day (BID) | CUTANEOUS | 1 refills | Status: DC

## 2020-11-26 NOTE — Progress Notes (Signed)
Date:  11/26/2020   Name:  [name] [name]   DOB:  1940/12/25   MRN:  [id number]   Chief Complaint: Rash (Rash on back. Patient says its been there for "a while." Several months. Painful to touch. Pt is worried he has shingles.)  Rash This is a new problem. The current episode started more than 1 month ago. The problem has been waxing and waning since onset. The affected locations include the back. The rash is characterized by itchiness and redness. He was exposed to nothing. Pertinent negatives include no anorexia, congestion, cough, diarrhea, eye pain, facial edema, fatigue, fever, joint pain, nail changes, rhinorrhea, shortness of breath, sore throat or vomiting. Past treatments include nothing.    Lab Results  Component Value Date   CREATININE 1.30 (H) 10/09/2020   BUN 23 10/09/2020   NA 139 10/09/2020   K 3.9 10/09/2020   CL 103 10/09/2020   CO2 26 10/09/2020   Lab Results  Component Value Date   CHOL 216 (H) 08/17/2019   HDL 72 08/17/2019   LDLCALC 124 (H) 08/17/2019   TRIG 98 08/17/2019   CHOLHDL 3.0 08/17/2019   Lab Results  Component Value Date   TSH 1.217 08/16/2019   No results found for: HGBA1C Lab Results  Component Value Date   WBC 6.0 10/09/2020   HGB 12.2 (L) 10/09/2020   HCT 38.1 (L) 10/09/2020   MCV 91.1 10/09/2020   PLT 214 10/09/2020   Lab Results  Component Value Date   ALT <5 10/09/2020   AST 29 10/09/2020   ALKPHOS 71 10/09/2020   BILITOT 1.0 10/09/2020     Review of Systems  Constitutional: Negative for chills, fatigue and fever.  HENT: Negative for congestion, drooling, ear discharge, ear pain, rhinorrhea and sore throat.   Eyes: Negative for pain.  Respiratory: Negative for cough, shortness of breath and wheezing.   Cardiovascular: Negative for chest pain, palpitations and leg swelling.  Gastrointestinal: Negative for abdominal pain, anorexia, blood in stool, constipation, diarrhea, nausea and vomiting.  Endocrine: Negative for  polydipsia.  Genitourinary: Negative for dysuria, frequency, hematuria and urgency.  Musculoskeletal: Negative for back pain, joint pain, myalgias and neck pain.  Skin: Positive for rash. Negative for nail changes.  Allergic/Immunologic: Negative for environmental allergies.  Neurological: Negative for dizziness and headaches.  Hematological: Does not bruise/bleed easily.  Psychiatric/Behavioral: Negative for suicidal ideas. The patient is not nervous/anxious.     Patient Active Problem List   Diagnosis Date Noted  . 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 Atherosclerosis of aorta ([place]) 04/10/2020  . Heart palpitations 12/29/2019  . Atrial flutter with rapid ventricular response ([place]) 08/18/2019  . New onset atrial flutter ([place]) 08/16/2019  . Goals of care, counseling/discussion 07/17/2019  . Prostate cancer metastatic to bone ([place]) 05/24/2019  . Establishing care with new doctor, encounter for 09/26/2018  . 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 Parkinson's disease ([place]) 11/07/2015  . 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 Hx of colonic polyps 05/18/2014  . BPH (benign prostatic hyperplasia) 01/12/2014  . Sleep apnea treated with nocturnal BiPAP 01/17/2012  . 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 Influenza Vaccines Anaphylaxis    Flu shot: 1974 anaphylaxis. 2nd time swollen arm    Past Surgical History:  Procedure Laterality Date  . APPENDECTOMY  1963  . COLONOSCOPY  2010, 2015  . 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  KYPHOPLASTY N/A 08/15/2020   Procedure: L2 compression fracture;  Surgeon: Hessie Knows, MD;  Location: [facility] ORS;  Service: Orthopedics;  Laterality: N/A;  . KYPHOPLASTY N/A 08/29/2020   Procedure: T8 KYPHOPLASTY;  Surgeon: Hessie Knows, MD;  Location: [facility] ORS;  Service: Orthopedics;  Laterality: N/A;  . KYPHOPLASTY N/A 11/12/2020   Procedure: L1  KYPHOPLASTY;  Surgeon: Hessie Knows, MD;  Location: [facility] ORS;  Service: Orthopedics;  Laterality: N/A;    Social History   Tobacco Use  . Smoking status: Former Smoker    Years: 4.00    Types: Cigars    Quit date: 09/07/2010    Years since quitting: 10.2  . Smokeless tobacco: Never Used  Vaping Use  . Vaping Use: Never used  Substance Use Topics  . Alcohol use: Not Currently    Comment: 1 can beer/day  . Drug use: Never     Medication list has been reviewed and updated.  Current Meds  Medication Sig  . apixaban (ELIQUIS) 5 MG TABS tablet Take 1 tablet (5 mg total) by mouth 2 (two) times daily.  . carbidopa-levodopa (SINEMET IR) 25-100 MG tablet 3 tablets at 7 AM/2 at 11 AM/2 tablets at 3 PM/2 tablet at 7 PM  . clonazePAM (KLONOPIN) 0.5 MG tablet Take 0.5 mg by mouth at bedtime. Dr Lisabeth Devoid  . diltiazem (CARDIZEM CD) 120 MG 24 hr capsule Take 1 capsule (120 mg total) by mouth daily.  . Glycerin-Hypromellose-PEG 400 (VISINE DRY EYE OP) Place 1 drop into both eyes 2 (two) times daily as needed (eye irritation).   Marland Kitchen ketoconazole (NIZORAL) 2 % shampoo Apply 1 application topically once a week.   Marland Kitchen oxycodone (OXY-IR) 5 MG capsule Take 1 capsule (5 mg total) by mouth every 6 (six) hours as needed for pain.    PHQ 2/9 Scores 11/26/2020 11/26/2020 04/10/2020 01/25/2020  PHQ - 2 Score 0 0 0 0  PHQ- 9 Score 5 0 0 -    GAD 7 : Generalized Anxiety Score 11/26/2020 11/26/2020 04/10/2020 11/22/2019  Nervous, Anxious, on Edge 1 0 0 0  Control/stop worrying 2 0 0 0  Worry too much - different things 2 0 0 0  Trouble relaxing 0 0 0 0  Restless 0 0 0 0  Easily annoyed or irritable 1 0 0 0  Afraid - awful might happen 3 0 0 0  Total GAD 7 Score 9 0 0 0  Anxiety Difficulty Somewhat difficult Not difficult at all Not difficult at all -    BP Readings from Last 3 Encounters:  11/26/20 108/78  11/18/20 108/78  11/12/20 (!) 165/92    Physical Exam Vitals and nursing note reviewed.  HENT:      Head: Normocephalic.     Right Ear: External ear normal.     Left Ear: External ear normal.     Nose: Nose normal.  Eyes:     General: No scleral icterus.       Right eye: No discharge.        Left eye: No discharge.     Conjunctiva/sclera: Conjunctivae normal.     Pupils: Pupils are equal, round, and reactive to light.  Neck:     Thyroid: No thyromegaly.     Vascular: No JVD.     Trachea: No tracheal deviation.  Cardiovascular:     Rate and Rhythm: Normal rate and regular rhythm.     Heart sounds: Normal heart sounds, S1 normal and S2 normal. No murmur heard.  No  systolic murmur is present.  No diastolic murmur is present. No friction rub. No gallop. No S3 or S4 sounds.   Pulmonary:     Effort: No respiratory distress.     Breath sounds: Normal breath sounds. No wheezing or rales.  Abdominal:     General: Bowel sounds are normal.     Palpations: Abdomen is soft. There is no mass.     Tenderness: There is no abdominal tenderness. There is no guarding or rebound.  Musculoskeletal:        General: No tenderness. Normal range of motion.     Cervical back: Normal range of motion and neck supple.     Right lower leg: No edema.     Left lower leg: No edema.  Lymphadenopathy:     Cervical: No cervical adenopathy.  Skin:    General: Skin is warm.     Findings: No rash.  Neurological:     Mental Status: He is alert and oriented to person, place, and time.     Cranial Nerves: No cranial nerve deficit.     Deep Tendon Reflexes: Reflexes are normal and symmetric.     Wt Readings from Last 3 Encounters:  11/26/20 128 lb (58.1 kg)  11/18/20 134 lb (60.8 kg)  11/08/20 134 lb (60.8 kg)    BP 108/78   Pulse 60   Ht 5\' 9"  (1.753 m)   Wt 128 lb (58.1 kg)   BMI 18.90 kg/m   Assessment and Plan: 1. Dermatitis New onset.  Persistent.  Stable.  Patient has a macular erythematous rash that blanches.  This is consistent with some type of dermatitis or seborrhea.  We will treat with  triamcinolone cream 0.1% to be applied to area once to twice a day.  If unresolved or next step will be dermatology referral. - triamcinolone (KENALOG) 0.1 %; Apply 1 application topically 2 (two) times daily.  Dispense: 453.6 g; Refill: 1

## 2020-11-27 ENCOUNTER — Ambulatory Visit: Payer: Self-pay | Admitting: *Deleted

## 2020-11-27 NOTE — Telephone Encounter (Signed)
Pt reports his wife died at 2pm today. Requesting something "To help me sleep, for my nerves."  If appropriate, please send to [name] on [address]. Pt does still take clonazepam. I don't need anything too powerful, just a little help." Assured pt NT would route to practice for PCPs review. Expressed my condolences for his loss . Please advise: [phone]  Reason for Disposition . Nursing judgment or information in reference  Answer Assessment - Initial Assessment Questions 1. REASON FOR CALL: "What is your main concern right now?"     *Need something to help me sleep, calm my nerves. Wife passed at 2pm today 2. ONSET: "When did the *No Answer* start?"     *No Answer* 3. SEVERITY: "How bad is the *No Answer*?"     *No Answer* 4. FEVER: "Do you have a fever?"     *No Answer* 5. OTHER SYMPTOMS: "Do you have any other new symptoms?"     *No Answer* 6. INTERVENTIONS AND RESPONSE: "What have you done so far to try to make this better? What medications have you used?"     *No Answer* 7. PREGNANCY: "Is there any chance you are pregnant?"     *No Answer*  Protocols used: NO GUIDELINE AVAILABLE-A-AH

## 2020-11-28 NOTE — Telephone Encounter (Signed)
Pt is going to call Dr Lisabeth Devoid to see if the med can be increased. If so, he will prescribe

## 2020-12-03 DIAGNOSIS — R2681 Unsteadiness on feet: Secondary | ICD-10-CM | POA: Diagnosis not present

## 2020-12-03 DIAGNOSIS — C61 Malignant neoplasm of prostate: Secondary | ICD-10-CM | POA: Diagnosis not present

## 2020-12-03 DIAGNOSIS — Z743 Need for continuous supervision: Secondary | ICD-10-CM | POA: Diagnosis not present

## 2020-12-03 DIAGNOSIS — G20 Parkinson's disease: Secondary | ICD-10-CM | POA: Diagnosis not present

## 2020-12-03 DIAGNOSIS — Z9181 History of falling: Secondary | ICD-10-CM | POA: Diagnosis not present

## 2020-12-03 DIAGNOSIS — S22060D Wedge compression fracture of T7-T8 vertebra, subsequent encounter for fracture with routine healing: Secondary | ICD-10-CM | POA: Diagnosis not present

## 2020-12-06 ENCOUNTER — Other Ambulatory Visit (HOSPITAL_COMMUNITY): Payer: Self-pay

## 2020-12-09 DIAGNOSIS — M545 Low back pain, unspecified: Secondary | ICD-10-CM | POA: Diagnosis not present

## 2020-12-09 DIAGNOSIS — M546 Pain in thoracic spine: Secondary | ICD-10-CM | POA: Diagnosis not present

## 2020-12-11 ENCOUNTER — Inpatient Hospital Stay: Payer: Medicare Other | Attending: Internal Medicine

## 2020-12-11 DIAGNOSIS — C7951 Secondary malignant neoplasm of bone: Secondary | ICD-10-CM | POA: Insufficient documentation

## 2020-12-11 DIAGNOSIS — G8929 Other chronic pain: Secondary | ICD-10-CM | POA: Diagnosis not present

## 2020-12-11 DIAGNOSIS — Z79899 Other long term (current) drug therapy: Secondary | ICD-10-CM | POA: Insufficient documentation

## 2020-12-11 DIAGNOSIS — G20 Parkinson's disease: Secondary | ICD-10-CM | POA: Insufficient documentation

## 2020-12-11 DIAGNOSIS — Z87891 Personal history of nicotine dependence: Secondary | ICD-10-CM | POA: Insufficient documentation

## 2020-12-11 DIAGNOSIS — R000 Tachycardia, unspecified: Secondary | ICD-10-CM | POA: Insufficient documentation

## 2020-12-11 DIAGNOSIS — M810 Age-related osteoporosis without current pathological fracture: Secondary | ICD-10-CM | POA: Insufficient documentation

## 2020-12-11 DIAGNOSIS — C61 Malignant neoplasm of prostate: Secondary | ICD-10-CM | POA: Diagnosis not present

## 2020-12-11 DIAGNOSIS — Z833 Family history of diabetes mellitus: Secondary | ICD-10-CM | POA: Diagnosis not present

## 2020-12-11 DIAGNOSIS — N400 Enlarged prostate without lower urinary tract symptoms: Secondary | ICD-10-CM | POA: Insufficient documentation

## 2020-12-11 DIAGNOSIS — Z8249 Family history of ischemic heart disease and other diseases of the circulatory system: Secondary | ICD-10-CM | POA: Insufficient documentation

## 2020-12-11 DIAGNOSIS — Z803 Family history of malignant neoplasm of breast: Secondary | ICD-10-CM | POA: Diagnosis not present

## 2020-12-11 DIAGNOSIS — S22060D Wedge compression fracture of T7-T8 vertebra, subsequent encounter for fracture with routine healing: Secondary | ICD-10-CM | POA: Diagnosis not present

## 2020-12-11 DIAGNOSIS — Z9181 History of falling: Secondary | ICD-10-CM | POA: Diagnosis not present

## 2020-12-11 DIAGNOSIS — K5900 Constipation, unspecified: Secondary | ICD-10-CM | POA: Diagnosis not present

## 2020-12-11 DIAGNOSIS — R5383 Other fatigue: Secondary | ICD-10-CM | POA: Insufficient documentation

## 2020-12-11 DIAGNOSIS — Z9049 Acquired absence of other specified parts of digestive tract: Secondary | ICD-10-CM | POA: Diagnosis not present

## 2020-12-11 DIAGNOSIS — M549 Dorsalgia, unspecified: Secondary | ICD-10-CM | POA: Insufficient documentation

## 2020-12-11 DIAGNOSIS — R232 Flushing: Secondary | ICD-10-CM | POA: Diagnosis not present

## 2020-12-11 DIAGNOSIS — Z743 Need for continuous supervision: Secondary | ICD-10-CM | POA: Diagnosis not present

## 2020-12-11 DIAGNOSIS — N183 Chronic kidney disease, stage 3 unspecified: Secondary | ICD-10-CM | POA: Insufficient documentation

## 2020-12-11 DIAGNOSIS — R2681 Unsteadiness on feet: Secondary | ICD-10-CM | POA: Diagnosis not present

## 2020-12-11 DIAGNOSIS — Z887 Allergy status to serum and vaccine status: Secondary | ICD-10-CM | POA: Diagnosis not present

## 2020-12-11 DIAGNOSIS — Z7901 Long term (current) use of anticoagulants: Secondary | ICD-10-CM | POA: Diagnosis not present

## 2020-12-11 LAB — CBC WITH DIFFERENTIAL/PLATELET
Abs Immature Granulocytes: 0.02 10*3/uL (ref 0.00–0.07)
Basophils Absolute: 0.1 10*3/uL (ref 0.0–0.1)
Basophils Relative: 1 %
Eosinophils Absolute: 0.2 10*3/uL (ref 0.0–0.5)
Eosinophils Relative: 2 %
HCT: 40.7 % (ref 39.0–52.0)
Hemoglobin: 13.3 g/dL (ref 13.0–17.0)
Immature Granulocytes: 0 %
Lymphocytes Relative: 22 %
Lymphs Abs: 1.9 10*3/uL (ref 0.7–4.0)
MCH: 30 pg (ref 26.0–34.0)
MCHC: 32.7 g/dL (ref 30.0–36.0)
MCV: 91.7 fL (ref 80.0–100.0)
Monocytes Absolute: 0.7 10*3/uL (ref 0.1–1.0)
Monocytes Relative: 8 %
Neutro Abs: 5.7 10*3/uL (ref 1.7–7.7)
Neutrophils Relative %: 67 %
Platelets: 222 10*3/uL (ref 150–400)
RBC: 4.44 MIL/uL (ref 4.22–5.81)
RDW: 15.6 % — ABNORMAL HIGH (ref 11.5–15.5)
WBC: 8.6 10*3/uL (ref 4.0–10.5)
nRBC: 0 % (ref 0.0–0.2)

## 2020-12-11 LAB — COMPREHENSIVE METABOLIC PANEL
ALT: <5 U/L (ref 0–44)
AST: 23 U/L (ref 15–41)
Albumin: 4.2 g/dL (ref 3.5–5.0)
Alkaline Phosphatase: 68 U/L (ref 38–126)
Anion gap: 9 (ref 5–15)
BUN: 25 mg/dL — ABNORMAL HIGH (ref 8–23)
CO2: 27 mmol/L (ref 22–32)
Calcium: 9.3 mg/dL (ref 8.9–10.3)
Chloride: 104 mmol/L (ref 98–111)
Creatinine, Ser: 1.48 mg/dL — ABNORMAL HIGH (ref 0.61–1.24)
GFR, Estimated: 48 mL/min — ABNORMAL LOW (ref >60)
Glucose, Bld: 105 mg/dL — ABNORMAL HIGH (ref 70–99)
Potassium: 4 mmol/L (ref 3.5–5.1)
Sodium: 140 mmol/L (ref 135–145)
Total Bilirubin: 1.3 mg/dL — ABNORMAL HIGH (ref 0.3–1.2)
Total Protein: 7.1 g/dL (ref 6.5–8.1)

## 2020-12-11 LAB — PSA: Prostatic Specific Antigen: 0.14 ng/mL (ref 0.00–4.00)

## 2020-12-13 ENCOUNTER — Other Ambulatory Visit: Payer: Self-pay

## 2020-12-13 ENCOUNTER — Inpatient Hospital Stay (HOSPITAL_BASED_OUTPATIENT_CLINIC_OR_DEPARTMENT_OTHER): Payer: Medicare Other | Admitting: Internal Medicine

## 2020-12-13 DIAGNOSIS — R000 Tachycardia, unspecified: Secondary | ICD-10-CM | POA: Diagnosis not present

## 2020-12-13 DIAGNOSIS — G20 Parkinson's disease: Secondary | ICD-10-CM | POA: Diagnosis not present

## 2020-12-13 DIAGNOSIS — C61 Malignant neoplasm of prostate: Secondary | ICD-10-CM | POA: Diagnosis not present

## 2020-12-13 DIAGNOSIS — G8929 Other chronic pain: Secondary | ICD-10-CM | POA: Diagnosis not present

## 2020-12-13 DIAGNOSIS — N183 Chronic kidney disease, stage 3 unspecified: Secondary | ICD-10-CM | POA: Diagnosis not present

## 2020-12-13 DIAGNOSIS — C7951 Secondary malignant neoplasm of bone: Secondary | ICD-10-CM

## 2020-12-13 NOTE — Assessment & Plan Note (Addendum)
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 On Eligard [urology/ q55m;last Jan 2022] April 2022-0.12; trending down- STABLE.  # DISCONTINUE  Zytiga plus prednisone given A. fib with RVR/poor tolerance especially with the oligometastatic disease.  If patient has progressive disease; on imaging Zytiga/Xtandi could be considered.  #A. Fib-chronic.  Rate controlled.  Continue Eliquis.  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    Eli-[facility] # DISPOSITION: #  follow up in 3 months - -MD; XQJJ-9-4 day prior- cbc/cmp/PSA;  -Dr.B

## 2020-12-13 NOTE — Progress Notes (Signed)
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 Sclerotic 1.5 cm left ischial lesion/ Sclerotic medial left iliac bone 1.5 cm lesion (series 2/image 47), increased from 1.0 cm. Gleason score of 4+5= 9; with almost all cores involved greater than 80%.  9/16 Lupron 19-month depot on 9/16. [Urology; Dr.Siniski]  # MID OCT 2020- Zytiga 1000 mg+ prednisone; stopped December 2021 [poor tolerance if with RVR]; DISCONTINUED.   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       Prostate cancer metastatic to bone ([place])  05/24/2019 Initial Diagnosis   Prostate cancer metastatic to bone ([place])    HISTORY OF PRESENTING ILLNESS:  [name] [name] 80 y.o.  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   Patient Zytiga plus prednisone is discontinued since December 2021 because of poor tolerance/tachycardia.  Patient continues to follow with pain management for his chronic back pain/compression fracture.\   Patient continues to have fatigue.  He continues to have tremors from his Parkinson's.  Continues to be on levodopa carbidopa.  He currently walks with a walker.   Review of Systems  Constitutional: Positive for malaise/fatigue. Negative for chills, diaphoresis, fever and weight loss.  HENT: Negative for nosebleeds and sore throat.   Eyes: Negative for double vision.   Respiratory: Negative for cough, hemoptysis, sputum production, shortness of breath and wheezing.   Cardiovascular: Negative for chest pain, palpitations, orthopnea and leg swelling.  Gastrointestinal: Positive for constipation. Negative for abdominal pain, blood in stool, diarrhea, heartburn, melena, nausea and vomiting.  Genitourinary: Positive for frequency and urgency. Negative for dysuria.  Musculoskeletal: Negative for back pain and joint pain.  Skin: Negative.  Negative for itching and rash.  Neurological: Positive for tremors. Negative for dizziness, tingling, focal weakness, weakness and headaches.  Endo/Heme/Allergies: Does not bruise/bleed easily.  Psychiatric/Behavioral: Negative for depression. The patient is not nervous/anxious and does not have insomnia.     MEDICAL HISTORY:  Past Medical History:  Diagnosis Date  . Atrial flutter ([place])   . BPH (benign prostatic hyperplasia)   . Cancer ([place])    PROSTATE  . Diverticulosis 2015  . Parkinson's disease ([place])   . 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 Prostate cancer metastatic to bone ([place])   . REM sleep behavior disorder   . Sleep apnea    BiPap    SURGICAL HISTORY: Past Surgical History:  Procedure Laterality Date  . APPENDECTOMY  1963  . COLONOSCOPY  2010, 2015  . 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 KYPHOPLASTY N/A 08/15/2020   Procedure: L2 compression fracture;  Surgeon: Hessie Knows, MD;  Location: [facility] ORS;  Service: Orthopedics;  Laterality: N/A;  . KYPHOPLASTY N/A 08/29/2020   Procedure: T8 KYPHOPLASTY;  Surgeon: Hessie Knows, MD;  Location: [facility] ORS;  Service: Orthopedics;  Laterality: N/A;  . KYPHOPLASTY N/A 11/12/2020   Procedure: L1 KYPHOPLASTY;  Surgeon: Hessie Knows, MD;  Location: [facility] ORS;  Service: Orthopedics;  Laterality: N/A;    SOCIAL HISTORY: Social History   Socioeconomic History  . Marital status: Married    Spouse name: Dwyane Luo  . Number of  children: 5  . Years of education: Not on file  . Highest education level: Not on file  Occupational History  . Occupation: retired    Comment: Nutritional therapist  Tobacco Use  . Smoking status: Former Smoker    Years: 4.00    Types: Cigars    Quit date: 09/07/2010    Years since quitting: 10.3  . Smokeless tobacco: Never Used  Vaping Use  . Vaping Use: Never used  Substance and Sexual Activity  . Alcohol use: Not Currently    Comment: 1 can beer/day  . Drug use: Never  . Sexual activity: Not Currently  Other Topics Concern  . Not on file  Social History Narrative   Lives in [place]; quit smoking in 2012; ocassional alcohol; Camera operator; wife; 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    Social Determinants of Health   Financial Resource Strain: Not on file  Food Insecurity: Not on file  Transportation Needs: Not on file  Physical Activity: Not on file  Stress: Not on file  Social Connections: Not on file  Intimate Partner Violence: Not on file    FAMILY HISTORY: Family History  Problem Relation Age of Onset  . Heart disease Maternal Grandfather   . Diabetes Paternal Grandfather   . Breast cancer Daughter     ALLERGIES:  is allergic to influenza vaccines.  MEDICATIONS:  Current Outpatient Medications  Medication Sig Dispense Refill  . apixaban (ELIQUIS) 5 MG TABS tablet Take 1 tablet (5 mg total) by mouth 2 (two) times daily. 60 tablet 1  . carbidopa-levodopa (SINEMET IR) 25-100 MG tablet 3 tablets at 7 AM/2 at 11 AM/2 tablets at 3 PM/2 tablet at 7 PM 810 tablet 1  . clonazePAM (KLONOPIN) 0.5 MG tablet Take 0.5 mg by mouth at bedtime. Dr Lisabeth Devoid    . diltiazem (CARDIZEM CD) 120 MG 24 hr capsule Take 1 capsule (120 mg total) by mouth daily. 30 capsule 2  . Glycerin-Hypromellose-PEG 400 (VISINE DRY EYE OP) Place 1 drop into both eyes 2 (two) times daily as needed (eye irritation).     Marland Kitchen ketoconazole (NIZORAL) 2 % shampoo Apply 1 application topically once a week.     Marland Kitchen  oxycodone (OXY-IR) 5 MG capsule Take 1 capsule (5 mg total) by mouth every 6 (six) hours as needed for pain. 15 capsule 0  . triamcinolone (KENALOG) 0.1 % Apply 1 application topically 2 (two) times daily. 453.6 g 1   No current facility-administered medications for this visit.      Marland Kitchen  PHYSICAL EXAMINATION: ECOG PERFORMANCE STATUS: 0 - Asymptomatic  Vitals:   12/13/20 1341  BP: 98/65  Pulse: (!) 58  Resp: 16  Temp: 98.7 F (37.1 C)  SpO2: 96%   Filed Weights   12/13/20 1341  Weight: 129 lb (58.5 kg)    Physical Exam Constitutional:      Comments: Frail-appearing Caucasian male patient.  Ambulating with a walker.  Alone.  HENT:     Head: Normocephalic and atraumatic.     Mouth/Throat:     Pharynx: No oropharyngeal exudate.  Eyes:     Pupils: Pupils are equal, round, and reactive to light.  Cardiovascular:     Rate and Rhythm: Normal rate and regular rhythm.  Pulmonary:     Effort: Pulmonary effort is normal. No  respiratory distress.     Breath sounds: Normal breath sounds. No wheezing.  Abdominal:     General: Bowel sounds are normal. There is no distension.     Palpations: Abdomen is soft. There is no mass.     Tenderness: There is no abdominal tenderness. There is no guarding or rebound.  Musculoskeletal:        General: No tenderness. Normal range of motion.     Cervical back: Normal range of motion and neck supple.  Skin:    General: Skin is warm.  Neurological:     Mental Status: He is alert and oriented to person, place, and time.     Comments: Tremor of right upper extremity.  Psychiatric:        Mood and Affect: Affect normal.      LABORATORY DATA:  I have reviewed the data as listed Lab Results  Component Value Date   WBC 8.6 12/11/2020   HGB 13.3 12/11/2020   HCT 40.7 12/11/2020   MCV 91.7 12/11/2020   PLT 222 12/11/2020   Recent Labs    02/12/20 1053 03/25/20 1048 05/06/20 0935 07/05/20 1132 08/08/20 0934 10/09/20 1345  12/11/20 1402  NA 138 138 142   < > 139 139 140  K 4.1 3.6 3.7   < > 4.0 3.9 4.0  CL 102 104 108   < > 100 103 104  CO2 27 25 25    < > 27 26 27   GLUCOSE 132* 128* 108*   < > 93 107* 105*  BUN 25* 21 21   < > 23 23 25*  CREATININE 1.77* 1.46* 1.60*   < > 1.52* 1.30* 1.48*  CALCIUM 9.7 9.5 9.5   < > 10.3 9.9 9.3  GFRNONAA 36* 45* 41*   < > 47* 56* 48*  GFRAA 42* 53* 47*  --   --   --   --   PROT 6.9 6.5 6.7   < > 7.4 7.1 7.1  ALBUMIN 4.0 3.8 3.9   < > 4.1 3.8 4.2  AST 42* 28 31   < > 30 29 23   ALT 6 8 6    < > 8 <5 <5  ALKPHOS 63 61 53   < > 76 71 68  BILITOT 1.6* 1.8* 2.1*   < > 2.4* 1.0 1.3*   < > = values in this interval not displayed.    RADIOGRAPHIC STUDIES: I have personally reviewed the radiological images as listed and agreed with the findings in the report. No results found.  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 On Eligard [urology/ q39m;last Jan 2022] April 2022-0.12; trending down- STABLE.  # DISCONTINUE  Zytiga plus prednisone given A. fib with RVR/poor tolerance especially with the oligometastatic disease.  If patient has progressive disease; on imaging Zytiga/Xtandi could be considered.  #A. Fib-chronic.  Rate controlled.  Continue Eliquis.  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    Eli-Uro # DISPOSITION: #  follow up in 3 months - -MD; XENM-0-7 day prior- cbc/cmp/PSA;  -Dr.B   All questions were answered. The patient knows to call the clinic with any problems, questions or concerns.    Cammie Sickle, MD 12/24/2020 12:35 PM

## 2020-12-16 ENCOUNTER — Other Ambulatory Visit (HOSPITAL_COMMUNITY): Payer: Self-pay

## 2020-12-18 ENCOUNTER — Ambulatory Visit: Payer: Medicare Other | Admitting: Family Medicine

## 2020-12-18 DIAGNOSIS — Z79891 Long term (current) use of opiate analgesic: Secondary | ICD-10-CM | POA: Diagnosis not present

## 2020-12-18 DIAGNOSIS — M62830 Muscle spasm of back: Secondary | ICD-10-CM | POA: Diagnosis not present

## 2020-12-18 DIAGNOSIS — Z79899 Other long term (current) drug therapy: Secondary | ICD-10-CM | POA: Diagnosis not present

## 2020-12-18 DIAGNOSIS — M5126 Other intervertebral disc displacement, lumbar region: Secondary | ICD-10-CM | POA: Diagnosis not present

## 2020-12-18 DIAGNOSIS — Z8546 Personal history of malignant neoplasm of prostate: Secondary | ICD-10-CM | POA: Diagnosis not present

## 2020-12-18 DIAGNOSIS — M5414 Radiculopathy, thoracic region: Secondary | ICD-10-CM | POA: Diagnosis not present

## 2020-12-18 DIAGNOSIS — Z8781 Personal history of (healed) traumatic fracture: Secondary | ICD-10-CM | POA: Diagnosis not present

## 2020-12-18 DIAGNOSIS — M5136 Other intervertebral disc degeneration, lumbar region: Secondary | ICD-10-CM | POA: Diagnosis not present

## 2020-12-18 DIAGNOSIS — G894 Chronic pain syndrome: Secondary | ICD-10-CM | POA: Diagnosis not present

## 2020-12-18 DIAGNOSIS — M5459 Other low back pain: Secondary | ICD-10-CM | POA: Diagnosis not present

## 2020-12-19 ENCOUNTER — Telehealth: Payer: Self-pay

## 2020-12-19 DIAGNOSIS — Z743 Need for continuous supervision: Secondary | ICD-10-CM | POA: Diagnosis not present

## 2020-12-19 DIAGNOSIS — C61 Malignant neoplasm of prostate: Secondary | ICD-10-CM | POA: Diagnosis not present

## 2020-12-19 DIAGNOSIS — R2681 Unsteadiness on feet: Secondary | ICD-10-CM | POA: Diagnosis not present

## 2020-12-19 DIAGNOSIS — Z9181 History of falling: Secondary | ICD-10-CM | POA: Diagnosis not present

## 2020-12-19 DIAGNOSIS — G20 Parkinson's disease: Secondary | ICD-10-CM | POA: Diagnosis not present

## 2020-12-19 DIAGNOSIS — S22060D Wedge compression fracture of T7-T8 vertebra, subsequent encounter for fracture with routine healing: Secondary | ICD-10-CM | POA: Diagnosis not present

## 2020-12-19 NOTE — Telephone Encounter (Signed)
Nutrition Assessment   Reason for Assessment:  Patient identified on Malnutrition Screening report for weight loss   ASSESSMENT:  80 year old male with metastatic prostate cancer to bone.  Past medical history of PD, CKD III, recent kyphoplasty.  Patient currently off treatment.  Spoke with patient via phone to introduce self and service at [facility].  Patient reports that his appetite is good.  Reports over the last 4-5 months has had increased pain in back causing pain and effecting mobility.  Receiving home PT currently. Reports typical day is eating fruit and cereal or oatmeal mixed with milk.  Then later has V-8 juice, candy bar and/or banana.  Sometimes will have a late meal (2-3pm).  Evening meal will be around 6pm tonight having pot roast with cooked vegetables.  Drinks boost original shake but not daily.  Reports that he has been probably eating less and not even realizing it.  Has been taking care of wife and she recently passed away.     Medications: reviewed   Labs: reviewed   Anthropometrics:   Height: 69 inches Weight: 129 lb on 4/8 160 lb on 12/23 BMI: 19  19% weight loss in the last 3 1/2 months, significant   Estimated Energy Needs  Kcals: 1700-2000 Protein: 85-100 g Fluid: 1.7 L   NUTRITION DIAGNOSIS: Inadequate oral intake related to back pain, mobility, cancer, passing of wife as evidenced by 18% weight loss in the last 3 1/2 months.     INTERVENTION:  Discussed ways to add calories and protein to diet. Will mail handout Encouraged 3 meals per day as sounds like mid-day maybe lighter meal.   Encouraged switching to 360 calorie boost plus shake and drink at least 1 daily between meals. Contact information mailed to patient   MONITORING, EVALUATION, GOAL: weight trends, intake   Next Visit: as needed  Sollie Vultaggio B. Zenia Resides, [place], [place] Registered Dietitian [phone] (mobile)

## 2020-12-22 DIAGNOSIS — M6281 Muscle weakness (generalized): Secondary | ICD-10-CM | POA: Diagnosis not present

## 2020-12-22 DIAGNOSIS — Z743 Need for continuous supervision: Secondary | ICD-10-CM | POA: Diagnosis not present

## 2020-12-22 DIAGNOSIS — R2681 Unsteadiness on feet: Secondary | ICD-10-CM | POA: Diagnosis not present

## 2020-12-22 DIAGNOSIS — C61 Malignant neoplasm of prostate: Secondary | ICD-10-CM | POA: Diagnosis not present

## 2020-12-22 DIAGNOSIS — Z9181 History of falling: Secondary | ICD-10-CM | POA: Diagnosis not present

## 2020-12-22 DIAGNOSIS — S22060D Wedge compression fracture of T7-T8 vertebra, subsequent encounter for fracture with routine healing: Secondary | ICD-10-CM | POA: Diagnosis not present

## 2020-12-22 DIAGNOSIS — G20 Parkinson's disease: Secondary | ICD-10-CM | POA: Diagnosis not present

## 2020-12-23 ENCOUNTER — Encounter: Payer: Self-pay | Admitting: Family Medicine

## 2020-12-23 ENCOUNTER — Ambulatory Visit (INDEPENDENT_AMBULATORY_CARE_PROVIDER_SITE_OTHER): Payer: Medicare Other | Admitting: Family Medicine

## 2020-12-23 ENCOUNTER — Other Ambulatory Visit: Payer: Self-pay

## 2020-12-23 VITALS — BP 110/60 | HR 60 | Ht 69.0 in | Wt 130.0 lb

## 2020-12-23 DIAGNOSIS — L309 Dermatitis, unspecified: Secondary | ICD-10-CM

## 2020-12-23 NOTE — Progress Notes (Signed)
Date:  12/23/2020   Name:  [name] [name]   DOB:  Jul 08, 1941   MRN:  [id number]   Chief Complaint: Follow-up (Dermatitis on back- cream helped)  Rash This is a chronic problem. The current episode started more than 1 month ago. The problem has been gradually improving since onset. The affected locations include the back. The rash is characterized by dryness and itchiness. Pertinent negatives include no congestion, cough, diarrhea, fever, shortness of breath or sore throat. Past treatments include topical steroids.    Lab Results  Component Value Date   CREATININE 1.48 (H) 12/11/2020   BUN 25 (H) 12/11/2020   NA 140 12/11/2020   K 4.0 12/11/2020   CL 104 12/11/2020   CO2 27 12/11/2020   Lab Results  Component Value Date   CHOL 216 (H) 08/17/2019   HDL 72 08/17/2019   LDLCALC 124 (H) 08/17/2019   TRIG 98 08/17/2019   CHOLHDL 3.0 08/17/2019   Lab Results  Component Value Date   TSH 1.217 08/16/2019   No results found for: HGBA1C Lab Results  Component Value Date   WBC 8.6 12/11/2020   HGB 13.3 12/11/2020   HCT 40.7 12/11/2020   MCV 91.7 12/11/2020   PLT 222 12/11/2020   Lab Results  Component Value Date   ALT <5 12/11/2020   AST 23 12/11/2020   ALKPHOS 68 12/11/2020   BILITOT 1.3 (H) 12/11/2020     Review of Systems  Constitutional: Negative for chills and fever.  HENT: Negative for congestion, drooling, ear discharge, ear pain and sore throat.   Respiratory: Negative for cough, shortness of breath and wheezing.   Cardiovascular: Negative for chest pain, palpitations and leg swelling.  Gastrointestinal: Negative for abdominal pain, blood in stool, constipation, diarrhea and nausea.  Endocrine: Negative for polydipsia.  Genitourinary: Negative for dysuria, frequency, hematuria and urgency.  Musculoskeletal: Negative for back pain, myalgias and neck pain.  Skin: Positive for rash.  Allergic/Immunologic: Negative for environmental allergies.  Neurological:  Negative for dizziness and headaches.  Hematological: Does not bruise/bleed easily.  Psychiatric/Behavioral: Negative for suicidal ideas. The patient is not nervous/anxious.     Patient Active Problem List   Diagnosis Date Noted  . 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 Atherosclerosis of aorta ([place]) 04/10/2020  . Heart palpitations 12/29/2019  . Atrial flutter with rapid ventricular response ([place]) 08/18/2019  . New onset atrial flutter ([place]) 08/16/2019  . Goals of care, counseling/discussion 07/17/2019  . Prostate cancer metastatic to bone ([place]) 05/24/2019  . Establishing care with new doctor, encounter for 09/26/2018  . 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 Parkinson's disease ([place]) 11/07/2015  . 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 Hx of colonic polyps 05/18/2014  . BPH (benign prostatic hyperplasia) 01/12/2014  . Sleep apnea treated with nocturnal BiPAP 01/17/2012  . 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 Influenza Vaccines Anaphylaxis    Flu shot: 1974 anaphylaxis. 2nd time swollen arm    Past Surgical History:  Procedure Laterality Date  . APPENDECTOMY  1963  . COLONOSCOPY  2010, 2015  . 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 KYPHOPLASTY N/A 08/15/2020   Procedure: L2 compression fracture;  Surgeon: Hessie Knows, MD;  Location: [facility] ORS;  Service: Orthopedics;  Laterality: N/A;  . KYPHOPLASTY N/A 08/29/2020   Procedure: T8 KYPHOPLASTY;  Surgeon: Hessie Knows, MD;  Location: [facility] ORS;  Service: Orthopedics;  Laterality: N/A;  . KYPHOPLASTY N/A 11/12/2020   Procedure: L1 KYPHOPLASTY;  Surgeon: Hessie Knows, MD;  Location: [facility] ORS;  Service: Orthopedics;  Laterality: N/A;    Social History   Tobacco Use  . Smoking status: Former Smoker    Years: 4.00    Types: Cigars    Quit date: 09/07/2010    Years since quitting: 10.3  . Smokeless tobacco: Never Used   Vaping Use  . Vaping Use: Never used  Substance Use Topics  . Alcohol use: Not Currently    Comment: 1 can beer/day  . Drug use: Never     Medication list has been reviewed and updated.  Current Meds  Medication Sig  . apixaban (ELIQUIS) 5 MG TABS tablet Take 1 tablet (5 mg total) by mouth 2 (two) times daily.  . carbidopa-levodopa (SINEMET IR) 25-100 MG tablet 3 tablets at 7 AM/2 at 11 AM/2 tablets at 3 PM/2 tablet at 7 PM  . clonazePAM (KLONOPIN) 0.5 MG tablet Take 0.5 mg by mouth at bedtime. Dr Lisabeth Devoid  . diltiazem (CARDIZEM CD) 120 MG 24 hr capsule Take 1 capsule (120 mg total) by mouth daily.  . Glycerin-Hypromellose-PEG 400 (VISINE DRY EYE OP) Place 1 drop into both eyes 2 (two) times daily as needed (eye irritation).   Marland Kitchen ketoconazole (NIZORAL) 2 % shampoo Apply 1 application topically once a week.   Marland Kitchen oxycodone (OXY-IR) 5 MG capsule Take 1 capsule (5 mg total) by mouth every 6 (six) hours as needed for pain.  Marland Kitchen triamcinolone (KENALOG) 0.1 % Apply 1 application topically 2 (two) times daily.    PHQ 2/9 Scores 12/23/2020 11/26/2020 11/26/2020 04/10/2020  PHQ - 2 Score 0 0 0 0  PHQ- 9 Score 0 5 0 0    GAD 7 : Generalized Anxiety Score 12/23/2020 11/26/2020 11/26/2020 04/10/2020  Nervous, Anxious, on Edge 0 1 0 0  Control/stop worrying 0 2 0 0  Worry too much - different things 0 2 0 0  Trouble relaxing 0 0 0 0  Restless 0 0 0 0  Easily annoyed or irritable 0 1 0 0  Afraid - awful might happen 0 3 0 0  Total GAD 7 Score 0 9 0 0  Anxiety Difficulty - Somewhat difficult Not difficult at all Not difficult at all    BP Readings from Last 3 Encounters:  12/23/20 110/60  12/13/20 98/65  11/26/20 108/78    Physical Exam Vitals reviewed.  HENT:     Head: Normocephalic.     Right Ear: Tympanic membrane, ear canal and external ear normal.     Left Ear: Tympanic membrane, ear canal and external ear normal.     Nose: Nose normal. No congestion or rhinorrhea.     Mouth/Throat:      Mouth: Mucous membranes are moist.  Eyes:     General: No scleral icterus.       Right eye: No discharge.        Left eye: No discharge.     Conjunctiva/sclera: Conjunctivae normal.     Pupils: Pupils are equal, round, and reactive to light.  Neck:     Thyroid: No thyromegaly.     Vascular: No JVD.     Trachea: No tracheal deviation.  Cardiovascular:     Rate and Rhythm: Normal rate and regular rhythm.     Heart sounds: Normal heart sounds. No murmur heard. No friction rub. No gallop.   Pulmonary:     Effort: No respiratory distress.  Breath sounds: Normal breath sounds. No wheezing, rhonchi or rales.  Abdominal:     General: Bowel sounds are normal.     Palpations: Abdomen is soft. There is no mass.     Tenderness: There is no abdominal tenderness. There is no guarding or rebound.  Musculoskeletal:        General: No tenderness. Normal range of motion.     Cervical back: Normal range of motion and neck supple.  Lymphadenopathy:     Cervical: No cervical adenopathy.  Skin:    General: Skin is warm.     Findings: No rash.  Neurological:     Mental Status: He is alert and oriented to person, place, and time.     Cranial Nerves: No cranial nerve deficit.     Deep Tendon Reflexes: Reflexes are normal and symmetric.     Wt Readings from Last 3 Encounters:  12/23/20 130 lb (59 kg)  12/13/20 129 lb (58.5 kg)  11/26/20 128 lb (58.1 kg)    BP 110/60   Pulse 60   Ht 5\' 9"  (1.753 m)   Wt 130 lb (59 kg)   BMI 19.20 kg/m   Assessment and Plan: 1. Dermatitis New onset.  Improving.  Stable.  There is been gradual improvement of the rash on patient's back with decreased erythema and induration.  Patient will continue triamcinolone cream but decrease the frequency to 3-4 times a week to lessen atrophy of the skin to the back area.

## 2020-12-24 DIAGNOSIS — S22060D Wedge compression fracture of T7-T8 vertebra, subsequent encounter for fracture with routine healing: Secondary | ICD-10-CM | POA: Diagnosis not present

## 2020-12-24 DIAGNOSIS — R2681 Unsteadiness on feet: Secondary | ICD-10-CM | POA: Diagnosis not present

## 2020-12-24 DIAGNOSIS — C61 Malignant neoplasm of prostate: Secondary | ICD-10-CM | POA: Diagnosis not present

## 2020-12-24 DIAGNOSIS — Z9181 History of falling: Secondary | ICD-10-CM | POA: Diagnosis not present

## 2020-12-24 DIAGNOSIS — Z743 Need for continuous supervision: Secondary | ICD-10-CM | POA: Diagnosis not present

## 2020-12-24 DIAGNOSIS — G20 Parkinson's disease: Secondary | ICD-10-CM | POA: Diagnosis not present

## 2020-12-25 ENCOUNTER — Telehealth: Payer: Self-pay

## 2020-12-25 NOTE — Telephone Encounter (Signed)
Spoke with Myra at [facility] and gave her verbal orders. She voiced understanding.

## 2020-12-25 NOTE — Telephone Encounter (Signed)
ok 

## 2020-12-25 NOTE — Telephone Encounter (Signed)
Receieved call from [place] at [facility] requesting verbal orders for home physical therapy starting next week with 2 week 2 and and 1 week 3; for back strengthening, to work on balance and ambulation training.

## 2020-12-26 DIAGNOSIS — C61 Malignant neoplasm of prostate: Secondary | ICD-10-CM | POA: Diagnosis not present

## 2020-12-26 DIAGNOSIS — Z9181 History of falling: Secondary | ICD-10-CM | POA: Diagnosis not present

## 2020-12-26 DIAGNOSIS — S22060D Wedge compression fracture of T7-T8 vertebra, subsequent encounter for fracture with routine healing: Secondary | ICD-10-CM | POA: Diagnosis not present

## 2020-12-26 DIAGNOSIS — R2681 Unsteadiness on feet: Secondary | ICD-10-CM | POA: Diagnosis not present

## 2020-12-26 DIAGNOSIS — G20 Parkinson's disease: Secondary | ICD-10-CM | POA: Diagnosis not present

## 2020-12-26 DIAGNOSIS — Z743 Need for continuous supervision: Secondary | ICD-10-CM | POA: Diagnosis not present

## 2020-12-30 DIAGNOSIS — G20 Parkinson's disease: Secondary | ICD-10-CM | POA: Diagnosis not present

## 2020-12-30 DIAGNOSIS — S22060D Wedge compression fracture of T7-T8 vertebra, subsequent encounter for fracture with routine healing: Secondary | ICD-10-CM | POA: Diagnosis not present

## 2020-12-30 DIAGNOSIS — Z743 Need for continuous supervision: Secondary | ICD-10-CM | POA: Diagnosis not present

## 2020-12-30 DIAGNOSIS — R2681 Unsteadiness on feet: Secondary | ICD-10-CM | POA: Diagnosis not present

## 2020-12-30 DIAGNOSIS — C61 Malignant neoplasm of prostate: Secondary | ICD-10-CM | POA: Diagnosis not present

## 2020-12-30 DIAGNOSIS — Z9181 History of falling: Secondary | ICD-10-CM | POA: Diagnosis not present

## 2021-01-01 DIAGNOSIS — S22060D Wedge compression fracture of T7-T8 vertebra, subsequent encounter for fracture with routine healing: Secondary | ICD-10-CM | POA: Diagnosis not present

## 2021-01-01 DIAGNOSIS — R2681 Unsteadiness on feet: Secondary | ICD-10-CM | POA: Diagnosis not present

## 2021-01-01 DIAGNOSIS — Z9181 History of falling: Secondary | ICD-10-CM | POA: Diagnosis not present

## 2021-01-01 DIAGNOSIS — C61 Malignant neoplasm of prostate: Secondary | ICD-10-CM | POA: Diagnosis not present

## 2021-01-01 DIAGNOSIS — Z743 Need for continuous supervision: Secondary | ICD-10-CM | POA: Diagnosis not present

## 2021-01-01 DIAGNOSIS — G20 Parkinson's disease: Secondary | ICD-10-CM | POA: Diagnosis not present

## 2021-01-10 DIAGNOSIS — G20 Parkinson's disease: Secondary | ICD-10-CM | POA: Diagnosis not present

## 2021-01-10 DIAGNOSIS — C61 Malignant neoplasm of prostate: Secondary | ICD-10-CM | POA: Diagnosis not present

## 2021-01-10 DIAGNOSIS — Z9181 History of falling: Secondary | ICD-10-CM | POA: Diagnosis not present

## 2021-01-10 DIAGNOSIS — S22060D Wedge compression fracture of T7-T8 vertebra, subsequent encounter for fracture with routine healing: Secondary | ICD-10-CM | POA: Diagnosis not present

## 2021-01-10 DIAGNOSIS — R2681 Unsteadiness on feet: Secondary | ICD-10-CM | POA: Diagnosis not present

## 2021-01-10 DIAGNOSIS — Z743 Need for continuous supervision: Secondary | ICD-10-CM | POA: Diagnosis not present

## 2021-01-13 DIAGNOSIS — Z1159 Encounter for screening for other viral diseases: Secondary | ICD-10-CM | POA: Diagnosis not present

## 2021-01-13 DIAGNOSIS — Z20828 Contact with and (suspected) exposure to other viral communicable diseases: Secondary | ICD-10-CM | POA: Diagnosis not present

## 2021-01-15 DIAGNOSIS — M5459 Other low back pain: Secondary | ICD-10-CM | POA: Diagnosis not present

## 2021-01-15 DIAGNOSIS — G894 Chronic pain syndrome: Secondary | ICD-10-CM | POA: Diagnosis not present

## 2021-01-15 DIAGNOSIS — Z8781 Personal history of (healed) traumatic fracture: Secondary | ICD-10-CM | POA: Diagnosis not present

## 2021-01-16 ENCOUNTER — Other Ambulatory Visit: Payer: Self-pay

## 2021-01-16 DIAGNOSIS — L309 Dermatitis, unspecified: Secondary | ICD-10-CM

## 2021-01-16 MED ORDER — TRIAMCINOLONE ACETONIDE 0.1 % EX CREA: 1.0000 "application " | TOPICAL_CREAM | Freq: Two times a day (BID) | CUTANEOUS | 1 refills | Status: DC

## 2021-01-16 NOTE — Progress Notes (Signed)
Sent in triamcinolone to [facility]- verified with pt that is where he wanted it sent

## 2021-01-20 DIAGNOSIS — Z1159 Encounter for screening for other viral diseases: Secondary | ICD-10-CM | POA: Diagnosis not present

## 2021-01-20 DIAGNOSIS — Z20828 Contact with and (suspected) exposure to other viral communicable diseases: Secondary | ICD-10-CM | POA: Diagnosis not present

## 2021-01-21 DIAGNOSIS — Z9181 History of falling: Secondary | ICD-10-CM | POA: Diagnosis not present

## 2021-01-21 DIAGNOSIS — R2681 Unsteadiness on feet: Secondary | ICD-10-CM | POA: Diagnosis not present

## 2021-01-21 DIAGNOSIS — G20 Parkinson's disease: Secondary | ICD-10-CM | POA: Diagnosis not present

## 2021-01-21 DIAGNOSIS — Z743 Need for continuous supervision: Secondary | ICD-10-CM | POA: Diagnosis not present

## 2021-01-21 DIAGNOSIS — M6281 Muscle weakness (generalized): Secondary | ICD-10-CM | POA: Diagnosis not present

## 2021-01-21 DIAGNOSIS — S22060D Wedge compression fracture of T7-T8 vertebra, subsequent encounter for fracture with routine healing: Secondary | ICD-10-CM | POA: Diagnosis not present

## 2021-01-21 DIAGNOSIS — C61 Malignant neoplasm of prostate: Secondary | ICD-10-CM | POA: Diagnosis not present

## 2021-01-22 DIAGNOSIS — S22060D Wedge compression fracture of T7-T8 vertebra, subsequent encounter for fracture with routine healing: Secondary | ICD-10-CM | POA: Diagnosis not present

## 2021-01-22 DIAGNOSIS — Z9181 History of falling: Secondary | ICD-10-CM | POA: Diagnosis not present

## 2021-01-22 DIAGNOSIS — C61 Malignant neoplasm of prostate: Secondary | ICD-10-CM | POA: Diagnosis not present

## 2021-01-22 DIAGNOSIS — Z743 Need for continuous supervision: Secondary | ICD-10-CM | POA: Diagnosis not present

## 2021-01-22 DIAGNOSIS — R2681 Unsteadiness on feet: Secondary | ICD-10-CM | POA: Diagnosis not present

## 2021-01-22 DIAGNOSIS — G20 Parkinson's disease: Secondary | ICD-10-CM | POA: Diagnosis not present

## 2021-01-27 DIAGNOSIS — Z1159 Encounter for screening for other viral diseases: Secondary | ICD-10-CM | POA: Diagnosis not present

## 2021-01-27 DIAGNOSIS — Z20828 Contact with and (suspected) exposure to other viral communicable diseases: Secondary | ICD-10-CM | POA: Diagnosis not present

## 2021-01-28 DIAGNOSIS — S22060D Wedge compression fracture of T7-T8 vertebra, subsequent encounter for fracture with routine healing: Secondary | ICD-10-CM | POA: Diagnosis not present

## 2021-01-28 DIAGNOSIS — G20 Parkinson's disease: Secondary | ICD-10-CM | POA: Diagnosis not present

## 2021-01-28 DIAGNOSIS — Z743 Need for continuous supervision: Secondary | ICD-10-CM | POA: Diagnosis not present

## 2021-01-28 DIAGNOSIS — R2681 Unsteadiness on feet: Secondary | ICD-10-CM | POA: Diagnosis not present

## 2021-01-28 DIAGNOSIS — Z9181 History of falling: Secondary | ICD-10-CM | POA: Diagnosis not present

## 2021-01-28 DIAGNOSIS — C61 Malignant neoplasm of prostate: Secondary | ICD-10-CM | POA: Diagnosis not present

## 2021-02-03 DIAGNOSIS — Z20828 Contact with and (suspected) exposure to other viral communicable diseases: Secondary | ICD-10-CM | POA: Diagnosis not present

## 2021-02-03 DIAGNOSIS — Z1159 Encounter for screening for other viral diseases: Secondary | ICD-10-CM | POA: Diagnosis not present

## 2021-02-07 DIAGNOSIS — G20 Parkinson's disease: Secondary | ICD-10-CM | POA: Diagnosis not present

## 2021-02-07 DIAGNOSIS — R2681 Unsteadiness on feet: Secondary | ICD-10-CM | POA: Diagnosis not present

## 2021-02-07 DIAGNOSIS — C61 Malignant neoplasm of prostate: Secondary | ICD-10-CM | POA: Diagnosis not present

## 2021-02-07 DIAGNOSIS — S22060D Wedge compression fracture of T7-T8 vertebra, subsequent encounter for fracture with routine healing: Secondary | ICD-10-CM | POA: Diagnosis not present

## 2021-02-07 DIAGNOSIS — Z9181 History of falling: Secondary | ICD-10-CM | POA: Diagnosis not present

## 2021-02-07 DIAGNOSIS — Z743 Need for continuous supervision: Secondary | ICD-10-CM | POA: Diagnosis not present

## 2021-02-10 DIAGNOSIS — Z1159 Encounter for screening for other viral diseases: Secondary | ICD-10-CM | POA: Diagnosis not present

## 2021-02-10 DIAGNOSIS — Z20828 Contact with and (suspected) exposure to other viral communicable diseases: Secondary | ICD-10-CM | POA: Diagnosis not present

## 2021-02-11 ENCOUNTER — Telehealth: Payer: Self-pay | Admitting: Family Medicine

## 2021-02-11 NOTE — Telephone Encounter (Signed)
Copied from [place] [id number]. Topic: Medicare AWV >> Feb 11, 2021 10:53 AM Cher Nakai R wrote: Reason for CRM:  Left message for patient to call back and schedule Medicare Annual Wellness Visit (AWV) in office.   If unable to come into the office for AWV,  please offer to do virtually or by telephone.  Last AWV: 07/02/2020 per [facility]  Please schedule at anytime with [facility] Health Advisor.  40 minute appointment  Any questions, please contact me at [phone]

## 2021-02-12 DIAGNOSIS — M5459 Other low back pain: Secondary | ICD-10-CM | POA: Diagnosis not present

## 2021-02-12 DIAGNOSIS — Z8781 Personal history of (healed) traumatic fracture: Secondary | ICD-10-CM | POA: Diagnosis not present

## 2021-02-12 DIAGNOSIS — Z79899 Other long term (current) drug therapy: Secondary | ICD-10-CM | POA: Diagnosis not present

## 2021-02-12 DIAGNOSIS — Z79891 Long term (current) use of opiate analgesic: Secondary | ICD-10-CM | POA: Diagnosis not present

## 2021-02-12 DIAGNOSIS — G894 Chronic pain syndrome: Secondary | ICD-10-CM | POA: Diagnosis not present

## 2021-02-13 ENCOUNTER — Other Ambulatory Visit: Payer: Self-pay | Admitting: Neurology

## 2021-02-15 DIAGNOSIS — R2681 Unsteadiness on feet: Secondary | ICD-10-CM | POA: Diagnosis not present

## 2021-02-15 DIAGNOSIS — S22060D Wedge compression fracture of T7-T8 vertebra, subsequent encounter for fracture with routine healing: Secondary | ICD-10-CM | POA: Diagnosis not present

## 2021-02-15 DIAGNOSIS — Z743 Need for continuous supervision: Secondary | ICD-10-CM | POA: Diagnosis not present

## 2021-02-15 DIAGNOSIS — G20 Parkinson's disease: Secondary | ICD-10-CM | POA: Diagnosis not present

## 2021-02-15 DIAGNOSIS — Z9181 History of falling: Secondary | ICD-10-CM | POA: Diagnosis not present

## 2021-02-15 DIAGNOSIS — C61 Malignant neoplasm of prostate: Secondary | ICD-10-CM | POA: Diagnosis not present

## 2021-02-17 ENCOUNTER — Other Ambulatory Visit: Payer: Self-pay

## 2021-02-17 DIAGNOSIS — S22060D Wedge compression fracture of T7-T8 vertebra, subsequent encounter for fracture with routine healing: Secondary | ICD-10-CM | POA: Diagnosis not present

## 2021-02-17 DIAGNOSIS — G20 Parkinson's disease: Secondary | ICD-10-CM | POA: Diagnosis not present

## 2021-02-17 DIAGNOSIS — C61 Malignant neoplasm of prostate: Secondary | ICD-10-CM | POA: Diagnosis not present

## 2021-02-17 DIAGNOSIS — Z20828 Contact with and (suspected) exposure to other viral communicable diseases: Secondary | ICD-10-CM | POA: Diagnosis not present

## 2021-02-17 DIAGNOSIS — Z743 Need for continuous supervision: Secondary | ICD-10-CM | POA: Diagnosis not present

## 2021-02-17 DIAGNOSIS — Z1159 Encounter for screening for other viral diseases: Secondary | ICD-10-CM | POA: Diagnosis not present

## 2021-02-17 DIAGNOSIS — Z9181 History of falling: Secondary | ICD-10-CM | POA: Diagnosis not present

## 2021-02-17 DIAGNOSIS — R2681 Unsteadiness on feet: Secondary | ICD-10-CM | POA: Diagnosis not present

## 2021-02-17 MED ORDER — CARBIDOPA-LEVODOPA 25-100 MG PO TABS: ORAL_TABLET | ORAL | 0 refills | Status: DC

## 2021-02-24 DIAGNOSIS — Z1159 Encounter for screening for other viral diseases: Secondary | ICD-10-CM | POA: Diagnosis not present

## 2021-02-24 DIAGNOSIS — Z20828 Contact with and (suspected) exposure to other viral communicable diseases: Secondary | ICD-10-CM | POA: Diagnosis not present

## 2021-03-05 ENCOUNTER — Telehealth: Payer: Self-pay | Admitting: *Deleted

## 2021-03-05 ENCOUNTER — Encounter: Payer: Self-pay | Admitting: Internal Medicine

## 2021-03-05 DIAGNOSIS — G20 Parkinson's disease: Secondary | ICD-10-CM | POA: Diagnosis not present

## 2021-03-05 DIAGNOSIS — M6281 Muscle weakness (generalized): Secondary | ICD-10-CM | POA: Diagnosis not present

## 2021-03-05 DIAGNOSIS — R2689 Other abnormalities of gait and mobility: Secondary | ICD-10-CM | POA: Diagnosis not present

## 2021-03-05 DIAGNOSIS — M5459 Other low back pain: Secondary | ICD-10-CM | POA: Diagnosis not present

## 2021-03-05 NOTE — Telephone Encounter (Signed)
Patient sent the following mychart message to request that his cbc/metc/ and psa be drawn at his [facility] with Dr. Manus Rudd on 03/07/21.  Dr. Manus Rudd, will you consider this so that the patient does not need to get stuck twice.   I am scheduled for a lab test July 6 prior to a July 8 appointment with Dr B. I have moved to [place] since last visit. Can you order the lab test be performed at [facility]. They're phone number is [phone] and address is [address], [place], [address]. Please let me know at [phone]. Kindly thanks for your support

## 2021-03-06 ENCOUNTER — Ambulatory Visit: Payer: Medicare Other | Admitting: Neurology

## 2021-03-06 ENCOUNTER — Telehealth: Payer: Self-pay

## 2021-03-06 ENCOUNTER — Encounter: Payer: Self-pay | Admitting: Internal Medicine

## 2021-03-06 DIAGNOSIS — G20 Parkinson's disease: Secondary | ICD-10-CM | POA: Diagnosis not present

## 2021-03-06 DIAGNOSIS — M6281 Muscle weakness (generalized): Secondary | ICD-10-CM | POA: Diagnosis not present

## 2021-03-06 DIAGNOSIS — R2689 Other abnormalities of gait and mobility: Secondary | ICD-10-CM | POA: Diagnosis not present

## 2021-03-06 DIAGNOSIS — M5459 Other low back pain: Secondary | ICD-10-CM | POA: Diagnosis not present

## 2021-03-06 NOTE — Telephone Encounter (Signed)
Called Marie at [facility]' office and asked them to give a call to Dr Nicholaus Bloom [phone] dentist office. Pt needs teeth cleaned next Tuesday- will call and give them the clearance because pt is on Eliquis. Also called Dr Cleda Daub office and gave them this information. Faxed med list to [facility]'s office @ [phone]

## 2021-03-06 NOTE — Telephone Encounter (Unsigned)
Copied from [place] [id number]. Topic: General - Call Back - No Documentation >> Mar 06, 2021  3:16 PM Erick Blinks wrote: Reason for CRM: Pt called and would like a call back from the nurse, he has dental concerns that he wants to discuss with nurse prior to seeing the dentist. Please advise   [phone]

## 2021-03-07 ENCOUNTER — Ambulatory Visit: Payer: Medicare Other | Admitting: Family Medicine

## 2021-03-07 ENCOUNTER — Encounter: Payer: Self-pay | Admitting: Internal Medicine

## 2021-03-07 ENCOUNTER — Other Ambulatory Visit: Payer: Self-pay

## 2021-03-07 DIAGNOSIS — C61 Malignant neoplasm of prostate: Secondary | ICD-10-CM

## 2021-03-10 DIAGNOSIS — Z1159 Encounter for screening for other viral diseases: Secondary | ICD-10-CM | POA: Diagnosis not present

## 2021-03-10 DIAGNOSIS — Z20828 Contact with and (suspected) exposure to other viral communicable diseases: Secondary | ICD-10-CM | POA: Diagnosis not present

## 2021-03-11 DIAGNOSIS — M6281 Muscle weakness (generalized): Secondary | ICD-10-CM | POA: Diagnosis not present

## 2021-03-11 DIAGNOSIS — M5459 Other low back pain: Secondary | ICD-10-CM | POA: Diagnosis not present

## 2021-03-11 DIAGNOSIS — R2689 Other abnormalities of gait and mobility: Secondary | ICD-10-CM | POA: Diagnosis not present

## 2021-03-11 DIAGNOSIS — G20 Parkinson's disease: Secondary | ICD-10-CM | POA: Diagnosis not present

## 2021-03-12 ENCOUNTER — Inpatient Hospital Stay: Payer: Medicare Other

## 2021-03-12 ENCOUNTER — Inpatient Hospital Stay: Payer: Medicare Other | Attending: Internal Medicine

## 2021-03-12 DIAGNOSIS — I4891 Unspecified atrial fibrillation: Secondary | ICD-10-CM | POA: Diagnosis not present

## 2021-03-12 DIAGNOSIS — N183 Chronic kidney disease, stage 3 unspecified: Secondary | ICD-10-CM | POA: Insufficient documentation

## 2021-03-12 DIAGNOSIS — Z87891 Personal history of nicotine dependence: Secondary | ICD-10-CM | POA: Diagnosis not present

## 2021-03-12 DIAGNOSIS — Z7901 Long term (current) use of anticoagulants: Secondary | ICD-10-CM | POA: Insufficient documentation

## 2021-03-12 DIAGNOSIS — M5459 Other low back pain: Secondary | ICD-10-CM | POA: Diagnosis not present

## 2021-03-12 DIAGNOSIS — G894 Chronic pain syndrome: Secondary | ICD-10-CM | POA: Diagnosis not present

## 2021-03-12 DIAGNOSIS — C61 Malignant neoplasm of prostate: Secondary | ICD-10-CM | POA: Diagnosis not present

## 2021-03-12 DIAGNOSIS — C7951 Secondary malignant neoplasm of bone: Secondary | ICD-10-CM | POA: Insufficient documentation

## 2021-03-12 LAB — CBC WITH DIFFERENTIAL/PLATELET
Abs Immature Granulocytes: 0.03 10*3/uL (ref 0.00–0.07)
Basophils Absolute: 0.1 10*3/uL (ref 0.0–0.1)
Basophils Relative: 1 %
Eosinophils Absolute: 0.1 10*3/uL (ref 0.0–0.5)
Eosinophils Relative: 2 %
HCT: 39.6 % (ref 39.0–52.0)
Hemoglobin: 12.5 g/dL — ABNORMAL LOW (ref 13.0–17.0)
Immature Granulocytes: 1 %
Lymphocytes Relative: 26 %
Lymphs Abs: 1.6 10*3/uL (ref 0.7–4.0)
MCH: 30.3 pg (ref 26.0–34.0)
MCHC: 31.6 g/dL (ref 30.0–36.0)
MCV: 96.1 fL (ref 80.0–100.0)
Monocytes Absolute: 0.6 10*3/uL (ref 0.1–1.0)
Monocytes Relative: 10 %
Neutro Abs: 3.6 10*3/uL (ref 1.7–7.7)
Neutrophils Relative %: 60 %
Platelets: 187 10*3/uL (ref 150–400)
RBC: 4.12 MIL/uL — ABNORMAL LOW (ref 4.22–5.81)
RDW: 13.9 % (ref 11.5–15.5)
WBC: 6 10*3/uL (ref 4.0–10.5)
nRBC: 0 % (ref 0.0–0.2)

## 2021-03-12 LAB — COMPREHENSIVE METABOLIC PANEL
ALT: 5 U/L (ref 0–44)
AST: 31 U/L (ref 15–41)
Albumin: 3.9 g/dL (ref 3.5–5.0)
Alkaline Phosphatase: 54 U/L (ref 38–126)
Anion gap: 7 (ref 5–15)
BUN: 18 mg/dL (ref 8–23)
CO2: 29 mmol/L (ref 22–32)
Calcium: 9.6 mg/dL (ref 8.9–10.3)
Chloride: 104 mmol/L (ref 98–111)
Creatinine, Ser: 1.23 mg/dL (ref 0.61–1.24)
GFR, Estimated: 60 mL/min — ABNORMAL LOW (ref >60)
Glucose, Bld: 103 mg/dL — ABNORMAL HIGH (ref 70–99)
Potassium: 4.4 mmol/L (ref 3.5–5.1)
Sodium: 140 mmol/L (ref 135–145)
Total Bilirubin: 1.3 mg/dL — ABNORMAL HIGH (ref 0.3–1.2)
Total Protein: 6.9 g/dL (ref 6.5–8.1)

## 2021-03-12 LAB — PSA: Prostatic Specific Antigen: 0.07 ng/mL (ref 0.00–4.00)

## 2021-03-13 ENCOUNTER — Other Ambulatory Visit: Payer: Medicare Other

## 2021-03-13 DIAGNOSIS — M6281 Muscle weakness (generalized): Secondary | ICD-10-CM | POA: Diagnosis not present

## 2021-03-13 DIAGNOSIS — G20 Parkinson's disease: Secondary | ICD-10-CM | POA: Diagnosis not present

## 2021-03-13 DIAGNOSIS — M5459 Other low back pain: Secondary | ICD-10-CM | POA: Diagnosis not present

## 2021-03-13 DIAGNOSIS — R2689 Other abnormalities of gait and mobility: Secondary | ICD-10-CM | POA: Diagnosis not present

## 2021-03-14 ENCOUNTER — Encounter: Payer: Self-pay | Admitting: Internal Medicine

## 2021-03-14 ENCOUNTER — Inpatient Hospital Stay (HOSPITAL_BASED_OUTPATIENT_CLINIC_OR_DEPARTMENT_OTHER): Payer: Medicare Other | Admitting: Internal Medicine

## 2021-03-14 VITALS — BP 121/76 | HR 56 | Temp 97.6°F | Resp 16 | Ht 69.0 in | Wt 137.0 lb

## 2021-03-14 DIAGNOSIS — G20 Parkinson's disease: Secondary | ICD-10-CM | POA: Diagnosis not present

## 2021-03-14 DIAGNOSIS — C61 Malignant neoplasm of prostate: Secondary | ICD-10-CM

## 2021-03-14 DIAGNOSIS — Z7901 Long term (current) use of anticoagulants: Secondary | ICD-10-CM | POA: Diagnosis not present

## 2021-03-14 DIAGNOSIS — M5459 Other low back pain: Secondary | ICD-10-CM | POA: Diagnosis not present

## 2021-03-14 DIAGNOSIS — N183 Chronic kidney disease, stage 3 unspecified: Secondary | ICD-10-CM | POA: Diagnosis not present

## 2021-03-14 DIAGNOSIS — C7951 Secondary malignant neoplasm of bone: Secondary | ICD-10-CM

## 2021-03-14 DIAGNOSIS — I4891 Unspecified atrial fibrillation: Secondary | ICD-10-CM | POA: Diagnosis not present

## 2021-03-14 DIAGNOSIS — R2689 Other abnormalities of gait and mobility: Secondary | ICD-10-CM | POA: Diagnosis not present

## 2021-03-14 DIAGNOSIS — M6281 Muscle weakness (generalized): Secondary | ICD-10-CM | POA: Diagnosis not present

## 2021-03-14 DIAGNOSIS — Z87891 Personal history of nicotine dependence: Secondary | ICD-10-CM | POA: Diagnosis not present

## 2021-03-14 NOTE — Assessment & Plan Note (Addendum)
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 On Eligard [urology/ q43m;last Jan 2022] July 2022-0.0.7; trending down-clinically stable.  # DISCONTINUE  Zytiga plus prednisone given A. fib with RVR/poor tolerance especially with the oligometastatic disease.  If patient has progressive disease; on imaging Zytiga/Xtandi could be considered.  #A. Fib-chronic.  Rate controlled.  Continue Eliquis.  #CKD stage III- GFR-60- stable  # HOt flashes-grade 1-stable   #December 2021-bone density osteoporosis/T score -3; L4 compression fracture/back pain-s/p kyphoplasty [reclast q 12 month- Jan 2022]; pain management physician.   # Transfer of care: He has recently moved to assisted facility in [place].  He is interested in moving his care to [place].  I think it is very reasonable-patient will let us know when he is ready to transfer his care discussed at length re: stage IV/incurable disease with patient/"daughter".   Eli-Uro; ?Tx-GSo # DISPOSITION: #  follow up in 3 months - -MD; EPPI-9-5 day prior- cbc/cmp/PSA;  -Dr.B

## 2021-03-14 NOTE — Progress Notes (Signed)
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 Sclerotic 1.5 cm left ischial lesion/ Sclerotic medial left iliac bone 1.5 cm lesion (series 2/image 47), increased from 1.0 cm. Gleason score of 4+5= 9; with almost all cores involved greater than 80%.  9/16 Lupron 6-month depot on 9/16. [Urology; Dr.Siniski]  # MID OCT 2020- Zytiga 1000 mg+ prednisone; stopped December 2021 [poor tolerance if with RVR]; DISCONTINUED.   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       Prostate cancer metastatic to bone ([place])  05/24/2019 Initial Diagnosis   Prostate cancer metastatic to bone ([place])    HISTORY OF PRESENTING ILLNESS:  [name] [name] 80 y.o.  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   Patient Zytiga plus prednisone is discontinued since December 2021 because of poor tolerance/tachycardia./Given patient's oligometastatic disease.  Continues to have mild to moderate back pain from compression fracture.  He continues to have fatigue.  Denies any falls.   Review of Systems  Constitutional:  Positive for malaise/fatigue. Negative for chills, diaphoresis, fever and weight loss.  HENT:  Negative for nosebleeds and sore throat.   Eyes:  Negative for double vision.  Respiratory:  Negative for cough, hemoptysis, sputum production, shortness of breath and  wheezing.   Cardiovascular:  Negative for chest pain, palpitations, orthopnea and leg swelling.  Gastrointestinal:  Positive for constipation. Negative for abdominal pain, blood in stool, diarrhea, heartburn, melena, nausea and vomiting.  Genitourinary:  Positive for frequency and urgency. Negative for dysuria.  Musculoskeletal:  Negative for back pain and joint pain.  Skin: Negative.  Negative for itching and rash.  Neurological:  Positive for tremors. Negative for dizziness, tingling, focal weakness, weakness and headaches.  Endo/Heme/Allergies:  Does not bruise/bleed easily.  Psychiatric/Behavioral:  Negative for depression. The patient is not nervous/anxious and does not have insomnia.    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   KYPHOPLASTY N/A 08/15/2020   Procedure: L2 compression fracture;  Surgeon: Hessie Knows, MD;  Location: [facility] ORS;  Service: Orthopedics;  Laterality: N/A;   KYPHOPLASTY N/A 08/29/2020   Procedure: T8 KYPHOPLASTY;  Surgeon: Hessie Knows, MD;  Location: [facility] ORS;  Service: Orthopedics;  Laterality: N/A;   KYPHOPLASTY N/A 11/12/2020   Procedure: L1 KYPHOPLASTY;  Surgeon: Hessie Knows, MD;  Location: [facility] ORS;  Service: Orthopedics;  Laterality: N/A;    SOCIAL  HISTORY: Social History   Socioeconomic History   Marital status: Married    Spouse name: Research officer, political party   Number of children: 5   Years of education: Not on file   Highest education level: Not on file  Occupational  History   Occupation: retired    Comment: Nutritional therapist  Tobacco Use   Smoking status: Former    Pack years: 0.00    Types: Cigars    Quit date: 09/07/2010    Years since quitting: 10.5   Smokeless tobacco: Never  Vaping Use   Vaping Use: Never used  Substance and Sexual Activity   Alcohol use: Not Currently    Comment: 1 can beer/day   Drug use: Never   Sexual activity: Not Currently  Other Topics Concern   Not on file  Social History Narrative   Lives in [place]; quit smoking in 2012; ocassional alcohol; Camera operator; wife; 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    Social Determinants of Health   Financial Resource Strain: Not on file  Food Insecurity: Not on file  Transportation Needs: Not on file  Physical Activity: Not on file  Stress: Not on file  Social Connections: Not on file  Intimate Partner Violence: Not on file    FAMILY HISTORY: Family History  Problem Relation Age of Onset   Heart disease Maternal Grandfather    Diabetes Paternal Grandfather    Breast cancer Daughter     ALLERGIES:  is allergic to influenza vaccines.  MEDICATIONS:  Current Outpatient Medications  Medication Sig Dispense Refill   apixaban (ELIQUIS) 5 MG TABS tablet Take 1 tablet (5 mg total) by mouth 2 (two) times daily. 60 tablet 1   carbidopa-levodopa (SINEMET IR) 25-100 MG tablet 3 tablets at 7 AM/2 at 11 AM/2 tablets at 3 PM/2 tablet at 7 PM 810 tablet 0   clonazePAM (KLONOPIN) 0.5 MG tablet Take 0.5 mg by mouth at bedtime. Dr Lisabeth Devoid     diltiazem (CARDIZEM CD) 120 MG 24 hr capsule Take 1 capsule (120 mg total) by mouth daily. 30 capsule 2   Glycerin-Hypromellose-PEG 400 (VISINE DRY EYE OP) Place 1 drop into both eyes 2 (two) times daily as needed (eye irritation).      ketoconazole (NIZORAL) 2 % shampoo Apply 1 application topically once a week.      oxycodone (OXY-IR) 5 MG capsule Take 1 capsule (5 mg total) by mouth every 6 (six) hours as needed for pain. 15 capsule 0    triamcinolone cream (KENALOG) 0.1 % Apply 1 application topically 2 (two) times daily. 453.6 g 1   RELISTOR 150 MG TABS Take 3 tablets by mouth daily.     No current facility-administered medications for this visit.      Marland Kitchen  PHYSICAL EXAMINATION: ECOG PERFORMANCE STATUS: 0 - Asymptomatic  Vitals:   03/14/21 1259  BP: 121/76  Pulse: (!) 56  Resp: 16  Temp: 97.6 F (36.4 C)  SpO2: 99%   Filed Weights   03/14/21 1259  Weight: 137 lb (62.1 kg)    Physical Exam Constitutional:      Comments: Frail-appearing Caucasian male patient.  Ambulating with a walker.  Alone.  HENT:     Head: Normocephalic and atraumatic.     Mouth/Throat:     Pharynx: No oropharyngeal exudate.  Eyes:     Pupils: Pupils are equal, round, and reactive to light.  Cardiovascular:     Rate and Rhythm: Normal rate and regular rhythm.  Pulmonary:     Effort:  Pulmonary effort is normal. No respiratory distress.     Breath sounds: Normal breath sounds. No wheezing.  Abdominal:     General: Bowel sounds are normal. There is no distension.     Palpations: Abdomen is soft. There is no mass.     Tenderness: no abdominal tenderness There is no guarding or rebound.  Musculoskeletal:        General: No tenderness. Normal range of motion.     Cervical back: Normal range of motion and neck supple.  Skin:    General: Skin is warm.  Neurological:     Mental Status: He is alert and oriented to person, place, and time.     Comments: Tremor of right upper extremity.  Psychiatric:        Mood and Affect: Affect normal.     LABORATORY DATA:  I have reviewed the data as listed Lab Results  Component Value Date   WBC 6.0 03/12/2021   HGB 12.5 (L) 03/12/2021   HCT 39.6 03/12/2021   MCV 96.1 03/12/2021   PLT 187 03/12/2021   Recent Labs    03/25/20 1048 05/06/20 0935 07/05/20 1132 10/09/20 1345 12/11/20 1402 03/12/21 1355  NA 138 142   < > 139 140 140  K 3.6 3.7   < > 3.9 4.0 4.4  CL 104 108   < > 103  104 104  CO2 25 25   < > 26 27 29   GLUCOSE 128* 108*   < > 107* 105* 103*  BUN 21 21   < > 23 25* 18  CREATININE 1.46* 1.60*   < > 1.30* 1.48* 1.23  CALCIUM 9.5 9.5   < > 9.9 9.3 9.6  GFRNONAA 45* 41*   < > 56* 48* 60*  GFRAA 53* 47*  --   --   --   --   PROT 6.5 6.7   < > 7.1 7.1 6.9  ALBUMIN 3.8 3.9   < > 3.8 4.2 3.9  AST 28 31   < > 29 23 31   ALT 8 6   < > <5 <5 5  ALKPHOS 61 53   < > 71 68 54  BILITOT 1.8* 2.1*   < > 1.0 1.3* 1.3*   < > = values in this interval not displayed.    RADIOGRAPHIC STUDIES: I have personally reviewed the radiological images as listed and agreed with the findings in the report. No results found.  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 On Eligard [urology/ q73m;last Jan 2022] July 2022-0.0.7; trending down-clinically stable.  # DISCONTINUE  Zytiga plus prednisone given A. fib with RVR/poor tolerance especially with the oligometastatic disease.  If patient has progressive disease; on imaging Zytiga/Xtandi could be considered.  #A. Fib-chronic.  Rate controlled.  Continue Eliquis.  #CKD stage III- GFR-60- stable  # HOt flashes-grade 1-stable   #December 2021-bone density osteoporosis/T score -3; L4 compression fracture/back pain-s/p kyphoplasty [reclast q 12 month- Jan 2022]; pain management physician.   # Transfer of care: He has recently moved to assisted facility in [place].  He is interested in moving his care to [place].  I think it is very reasonable-patient will let us know when he is ready to transfer his care discussed at length re: stage IV/incurable disease with patient/"daughter".   Eli-Uro; ?Tx-GSo # DISPOSITION: #  follow up in 3 months - -MD; YSAY-3-0 day prior- cbc/cmp/PSA;  -  Dr.B  All questions were answered. The patient knows to call the clinic with any problems, questions or concerns.     Cammie Sickle, MD 03/16/2021 9:10 PM

## 2021-03-16 ENCOUNTER — Encounter: Payer: Self-pay | Admitting: Internal Medicine

## 2021-03-17 DIAGNOSIS — Z1159 Encounter for screening for other viral diseases: Secondary | ICD-10-CM | POA: Diagnosis not present

## 2021-03-17 DIAGNOSIS — M6281 Muscle weakness (generalized): Secondary | ICD-10-CM | POA: Diagnosis not present

## 2021-03-17 DIAGNOSIS — R2689 Other abnormalities of gait and mobility: Secondary | ICD-10-CM | POA: Diagnosis not present

## 2021-03-17 DIAGNOSIS — M5459 Other low back pain: Secondary | ICD-10-CM | POA: Diagnosis not present

## 2021-03-17 DIAGNOSIS — Z20828 Contact with and (suspected) exposure to other viral communicable diseases: Secondary | ICD-10-CM | POA: Diagnosis not present

## 2021-03-17 DIAGNOSIS — G20 Parkinson's disease: Secondary | ICD-10-CM | POA: Diagnosis not present

## 2021-03-19 DIAGNOSIS — M5459 Other low back pain: Secondary | ICD-10-CM | POA: Diagnosis not present

## 2021-03-19 DIAGNOSIS — R2689 Other abnormalities of gait and mobility: Secondary | ICD-10-CM | POA: Diagnosis not present

## 2021-03-19 DIAGNOSIS — G20 Parkinson's disease: Secondary | ICD-10-CM | POA: Diagnosis not present

## 2021-03-19 DIAGNOSIS — M6281 Muscle weakness (generalized): Secondary | ICD-10-CM | POA: Diagnosis not present

## 2021-03-20 DIAGNOSIS — R2689 Other abnormalities of gait and mobility: Secondary | ICD-10-CM | POA: Diagnosis not present

## 2021-03-20 DIAGNOSIS — M5459 Other low back pain: Secondary | ICD-10-CM | POA: Diagnosis not present

## 2021-03-20 DIAGNOSIS — G20 Parkinson's disease: Secondary | ICD-10-CM | POA: Diagnosis not present

## 2021-03-20 DIAGNOSIS — M6281 Muscle weakness (generalized): Secondary | ICD-10-CM | POA: Diagnosis not present

## 2021-03-24 DIAGNOSIS — Z20828 Contact with and (suspected) exposure to other viral communicable diseases: Secondary | ICD-10-CM | POA: Diagnosis not present

## 2021-03-24 DIAGNOSIS — M5459 Other low back pain: Secondary | ICD-10-CM | POA: Diagnosis not present

## 2021-03-24 DIAGNOSIS — G20 Parkinson's disease: Secondary | ICD-10-CM | POA: Diagnosis not present

## 2021-03-24 DIAGNOSIS — Z1159 Encounter for screening for other viral diseases: Secondary | ICD-10-CM | POA: Diagnosis not present

## 2021-03-24 DIAGNOSIS — R2689 Other abnormalities of gait and mobility: Secondary | ICD-10-CM | POA: Diagnosis not present

## 2021-03-24 DIAGNOSIS — M6281 Muscle weakness (generalized): Secondary | ICD-10-CM | POA: Diagnosis not present

## 2021-03-26 DIAGNOSIS — G20 Parkinson's disease: Secondary | ICD-10-CM | POA: Diagnosis not present

## 2021-03-26 DIAGNOSIS — R2689 Other abnormalities of gait and mobility: Secondary | ICD-10-CM | POA: Diagnosis not present

## 2021-03-26 DIAGNOSIS — M5459 Other low back pain: Secondary | ICD-10-CM | POA: Diagnosis not present

## 2021-03-26 DIAGNOSIS — M6281 Muscle weakness (generalized): Secondary | ICD-10-CM | POA: Diagnosis not present

## 2021-03-26 DIAGNOSIS — M19011 Primary osteoarthritis, right shoulder: Secondary | ICD-10-CM | POA: Diagnosis not present

## 2021-03-28 ENCOUNTER — Ambulatory Visit: Payer: Medicare Other | Admitting: Family Medicine

## 2021-03-28 DIAGNOSIS — R2689 Other abnormalities of gait and mobility: Secondary | ICD-10-CM | POA: Diagnosis not present

## 2021-03-28 DIAGNOSIS — G20 Parkinson's disease: Secondary | ICD-10-CM | POA: Diagnosis not present

## 2021-03-28 DIAGNOSIS — M6281 Muscle weakness (generalized): Secondary | ICD-10-CM | POA: Diagnosis not present

## 2021-03-28 DIAGNOSIS — M5459 Other low back pain: Secondary | ICD-10-CM | POA: Diagnosis not present

## 2021-04-01 DIAGNOSIS — G20 Parkinson's disease: Secondary | ICD-10-CM | POA: Diagnosis not present

## 2021-04-01 DIAGNOSIS — R2689 Other abnormalities of gait and mobility: Secondary | ICD-10-CM | POA: Diagnosis not present

## 2021-04-01 DIAGNOSIS — M5459 Other low back pain: Secondary | ICD-10-CM | POA: Diagnosis not present

## 2021-04-01 DIAGNOSIS — M6281 Muscle weakness (generalized): Secondary | ICD-10-CM | POA: Diagnosis not present

## 2021-04-02 ENCOUNTER — Ambulatory Visit: Payer: Self-pay | Admitting: Urology

## 2021-04-02 ENCOUNTER — Encounter: Payer: Self-pay | Admitting: Family Medicine

## 2021-04-02 DIAGNOSIS — F5101 Primary insomnia: Secondary | ICD-10-CM | POA: Diagnosis not present

## 2021-04-02 DIAGNOSIS — I959 Hypotension, unspecified: Secondary | ICD-10-CM | POA: Diagnosis not present

## 2021-04-02 DIAGNOSIS — C7951 Secondary malignant neoplasm of bone: Secondary | ICD-10-CM | POA: Diagnosis not present

## 2021-04-02 DIAGNOSIS — C61 Malignant neoplasm of prostate: Secondary | ICD-10-CM | POA: Diagnosis not present

## 2021-04-02 DIAGNOSIS — I4892 Unspecified atrial flutter: Secondary | ICD-10-CM | POA: Diagnosis not present

## 2021-04-02 DIAGNOSIS — L209 Atopic dermatitis, unspecified: Secondary | ICD-10-CM | POA: Diagnosis not present

## 2021-04-02 DIAGNOSIS — G20 Parkinson's disease: Secondary | ICD-10-CM | POA: Diagnosis not present

## 2021-04-02 DIAGNOSIS — G4733 Obstructive sleep apnea (adult) (pediatric): Secondary | ICD-10-CM | POA: Diagnosis not present

## 2021-04-03 ENCOUNTER — Other Ambulatory Visit: Payer: Self-pay

## 2021-04-03 ENCOUNTER — Ambulatory Visit (INDEPENDENT_AMBULATORY_CARE_PROVIDER_SITE_OTHER): Payer: Medicare Other | Admitting: Urology

## 2021-04-03 ENCOUNTER — Encounter: Payer: Self-pay | Admitting: Urology

## 2021-04-03 VITALS — BP 106/77 | HR 60 | Ht 69.0 in | Wt 135.0 lb

## 2021-04-03 DIAGNOSIS — R351 Nocturia: Secondary | ICD-10-CM | POA: Diagnosis not present

## 2021-04-03 DIAGNOSIS — C61 Malignant neoplasm of prostate: Secondary | ICD-10-CM

## 2021-04-03 DIAGNOSIS — G20 Parkinson's disease: Secondary | ICD-10-CM | POA: Diagnosis not present

## 2021-04-03 DIAGNOSIS — M5459 Other low back pain: Secondary | ICD-10-CM | POA: Diagnosis not present

## 2021-04-03 DIAGNOSIS — R2689 Other abnormalities of gait and mobility: Secondary | ICD-10-CM | POA: Diagnosis not present

## 2021-04-03 DIAGNOSIS — M6281 Muscle weakness (generalized): Secondary | ICD-10-CM | POA: Diagnosis not present

## 2021-04-03 LAB — BLADDER SCAN AMB NON-IMAGING

## 2021-04-03 MED ORDER — LEUPROLIDE ACETATE (6 MONTH) 45 MG ~~LOC~~ KIT
45.0000 mg | PACK | Freq: Once | SUBCUTANEOUS | Status: AC
  Administered 2021-04-03: 45 mg via SUBCUTANEOUS

## 2021-04-03 NOTE — Patient Instructions (Signed)
Minimize fluids after 6 PM at night, and urinate right before going to bed.  Would also recommend avoiding diet drinks, sodas, or carbonated drinks in the evenings, as this can lead to getting up more times overnight to pee.

## 2021-04-03 NOTE — Progress Notes (Signed)
   04/03/2021 1:40 PM   CION BENZING 1940-11-30 [id number]  Reason for visit: Follow up prostate cancer, urinary symptoms/nocturia   HPI: I saw Mr. Rutigliano back in urology clinic today for follow-up of prostate cancer.  He is a 80 year old male with Parkinson's disease, solitary right kidney, and metastatic prostate cancer(diagnosed August 2020: 4+5=9, 12/12 cores, >80% involvement) to the left ischial tuberosity/ilium currently treated with ADT(urology).  He was previously on Zytiga and prednisone with oncology, but this was stopped secondary to A. fib/RVR.  He also has a history of bothersome urinary symptoms and incomplete bladder emptying, as well as a history of a PVP with an outside urologist a few years ago.  He reports that his urinary symptoms have significantly improved after being on ADT for over a year now, and he really denies any bothersome urinary symptoms today aside from nocturia 3-4 times at night that has been chronic long-term.  PVR is normal today at 7 mL.  We discontinued Flomax and January 2022 secondary to some hypotension, and he has not noticed any worsening of his urinary symptoms since stopping that medication.  We discussed behavioral strategies regarding nocturia including minimizing fluids before bedtime and avoiding bladder irritants.   He has also had a excellent PSA response to treatment, and most recent PSA was stably low at 0. 07 in July 2022.  I reviewed the outside oncology notes.   He has also had multiple vertebral compression fractures over the last few months requiring repair with orthopedics.  Biopsy showed no evidence of metastatic prostate cancer.  He recently moved to [place] to assisted living, but would like to continue his urologic care here in [place].  We are happy to facilitate getting him in with [facility] in [place] if he opts to transfer care closer to home for ongoing ADT.   Continue ADT(55-monthEligard given today),  encouraged calcium and vitamin D supplementation Continue oncology follow-up RTC 6 months with PSA prior, PVR   BBilley Co MD  [facility] [place] [place] [phone]

## 2021-04-07 DIAGNOSIS — Z20828 Contact with and (suspected) exposure to other viral communicable diseases: Secondary | ICD-10-CM | POA: Diagnosis not present

## 2021-04-07 DIAGNOSIS — R2689 Other abnormalities of gait and mobility: Secondary | ICD-10-CM | POA: Diagnosis not present

## 2021-04-07 DIAGNOSIS — M5459 Other low back pain: Secondary | ICD-10-CM | POA: Diagnosis not present

## 2021-04-07 DIAGNOSIS — Z1159 Encounter for screening for other viral diseases: Secondary | ICD-10-CM | POA: Diagnosis not present

## 2021-04-07 DIAGNOSIS — M6281 Muscle weakness (generalized): Secondary | ICD-10-CM | POA: Diagnosis not present

## 2021-04-07 DIAGNOSIS — G20 Parkinson's disease: Secondary | ICD-10-CM | POA: Diagnosis not present

## 2021-04-09 DIAGNOSIS — G20 Parkinson's disease: Secondary | ICD-10-CM | POA: Diagnosis not present

## 2021-04-09 DIAGNOSIS — M6281 Muscle weakness (generalized): Secondary | ICD-10-CM | POA: Diagnosis not present

## 2021-04-09 DIAGNOSIS — M5459 Other low back pain: Secondary | ICD-10-CM | POA: Diagnosis not present

## 2021-04-09 DIAGNOSIS — R2689 Other abnormalities of gait and mobility: Secondary | ICD-10-CM | POA: Diagnosis not present

## 2021-04-09 NOTE — Progress Notes (Signed)
Date:  04/11/2021   ID:  [name] [name], DOB 05-Apr-1941, MRN [id number]  PCP:  [name] Cha, MD  Cardiologist:  [name] Kras, DO, [name] [name] (established care 04/10/2021) Former Cardiologist: [name] Cowman, MD  REASON FOR CONSULT: Atrial flutter  REQUESTING PHYSICIAN:  [name] Cha, MD [address] [place] [address]  Chief Complaint  Patient presents with   Atherosclerosis of aorta    New Patient (Initial Visit)   Atrial Flutter    HPI  [name] [name] is a 80 y.o. 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  He is referred to the office at the request of [name] Cha, MD for evaluation of atrial flutter.  Patient was referred to the office at the request of his PCP to establish care given his history of paroxysmal atrial flutter.  He recently moved from [place] to [place] and is here to establish care with a new cardiologist.  He was under the care of Dr.Alexander [name] was managing his atrial flutter that was diagnosed approximately in 2020.  He has been on diltiazem as well as Eliquis for thromboembolic prophylaxis.  He does not endorse any evidence of bleeding.  No prior history of intracranial bleeding or gastrointestinal bleeding.  He has not required ablation and or cardioversion to maintain normal sinus rhythm.  No change in overall functional status.  He currently lives in a senior care facility.  FUNCTIONAL STATUS: Walks about 1.5 miles per day and goes to physical therapy.    ALLERGIES: Allergies  Allergen Reactions   Influenza Vaccines Anaphylaxis    Flu shot: 1974  anaphylaxis. 2nd time swollen arm    MEDICATION LIST PRIOR TO VISIT: Current Meds  Medication Sig   Acetaminophen (TYLENOL ARTHRITIS PAIN PO) Take 500 mg by mouth as needed.   apixaban (ELIQUIS) 5 MG TABS tablet Take 1 tablet (5 mg total) by mouth 2 (two) times daily.   carbidopa-levodopa (SINEMET IR) 25-100 MG tablet 3 tablets at 7 AM/2 at 11 AM/2 tablets at 3 PM/2 tablet at 7 PM   clonazePAM (KLONOPIN) 0.5 MG tablet Take 1 tablet by mouth at bedtime.   diltiazem (CARDIZEM) 30 MG tablet Take 30 mg by mouth 2 (two) times daily.   Glycerin-Hypromellose-PEG 400 (VISINE DRY EYE OP) Place 1 drop into both eyes 2 (two) times daily as needed (eye irritation).    ketoconazole (NIZORAL) 2 % shampoo Apply 1 application topically once a week.    leuprolide, 6 Month, (ELIGARD) 45 MG injection Inject 45 mg into the skin every 6 (six) months.   rosuvastatin (CRESTOR) 10 MG tablet Take 1 tablet (10 mg total) by mouth at bedtime.   triamcinolone cream (KENALOG) 0.1 % Apply 1 application topically 2 (two) times daily.     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   KYPHOPLASTY N/A 08/15/2020   Procedure: L2 compression fracture;  Surgeon: [name] Knows, MD;  Location: [facility] ORS;  Service: Orthopedics;  Laterality: N/A;   KYPHOPLASTY N/A 08/29/2020   Procedure: T8 KYPHOPLASTY;  Surgeon: [name] Knows, MD;  Location: [facility] ORS;  Service: Orthopedics;  Laterality: N/A;   KYPHOPLASTY N/A 11/12/2020   Procedure: L1  KYPHOPLASTY;  Surgeon: [name] Knows, MD;  Location: [facility] ORS;  Service: Orthopedics;  Laterality: N/A;    FAMILY HISTORY: The patient family history includes Breast cancer in his daughter; Diabetes in his paternal grandfather; Heart disease in his maternal grandfather.  SOCIAL HISTORY:  The patient  reports that he quit smoking about 10 years ago. His smoking use included cigars. He has never used smokeless tobacco. He reports previous alcohol use. He reports that he does not use drugs.  REVIEW OF SYSTEMS: Review of Systems  Constitutional: Negative for chills and fever.  HENT:  Negative for hoarse voice and nosebleeds.   Eyes:  Negative for discharge, double vision and pain.  Cardiovascular:  Negative for chest pain, claudication, dyspnea on exertion, leg swelling, near-syncope, orthopnea, palpitations, paroxysmal nocturnal dyspnea and syncope.  Respiratory:  Negative for hemoptysis and shortness of breath.   Musculoskeletal:  Negative for muscle cramps and myalgias.  Gastrointestinal:  Negative for abdominal pain, constipation, diarrhea, hematemesis, hematochezia, melena, nausea and vomiting.  Neurological:  Negative for dizziness and light-headedness.   PHYSICAL EXAM: Vitals with BMI 04/11/2021 04/03/2021 03/14/2021  Height '5\' 4"'$  '5\' 9"'$  '5\' 9"'$   Weight 142 lbs 135 lbs 137 lbs  BMI 24.36 [id number]  Systolic [id number]  Diastolic 67 77 76  Pulse 61 60 56    CONSTITUTIONAL: Well-developed and well-nourished. No acute distress.  SKIN: Skin is warm and dry. No rash noted. No cyanosis. No pallor. No jaundice HEAD: Normocephalic and atraumatic.  EYES: No scleral icterus MOUTH/THROAT: Moist oral membranes.  NECK: No JVD present. No thyromegaly noted. No carotid bruits  LYMPHATIC: No visible cervical adenopathy.  CHEST Normal respiratory effort. No intercostal retractions  LUNGS: Clear to auscultation bilaterally. No stridor. No wheezes. No rales.  CARDIOVASCULAR: Regular rate and  rhythm, positive S1-S2, no murmurs rubs or gallops appreciated. ABDOMINAL: Soft, nontender, nondistended, positive bowel sounds all 4 quadrants.  No apparent ascites.  EXTREMITIES: No peripheral edema, warm extremities, 2+ dorsalis pedis posterior tibial pulses, signs of chronic venous insufficiency. HEMATOLOGIC: No significant bruising NEUROLOGIC: Oriented to person, place, and time. Nonfocal. Normal muscle tone.  PSYCHIATRIC: Normal mood and affect. Normal behavior. Cooperative  CARDIAC DATABASE: EKG: 04/11/2021: Sinus bradycardia, 52 bpm, RBBB, left axis, left anterior fascicular block, T wave inversions in the inferior leads cannot rule out ischemia.   Echocardiogram: 08/17/2019: LVEF 50-55%, low normal LVEF, moderate LVH, grade 1 diastolic impairment, no regional wall motion abnormalities, normal right ventricular size and function, right atrium mildly dilated, trivial MR, trivial TR.   Stress Testing: No results found for this or any previous visit from the past 1095 days.  Heart Catheterization: None  LABORATORY DATA: CBC Latest Ref Rng & Units 03/12/2021 12/11/2020 10/09/2020  WBC 4.0 - 10.5 K/uL 6.0 8.6 6.0  Hemoglobin 13.0 - 17.0 g/dL 12.5(L) 13.3 12.2(L)  Hematocrit 39.0 - 52.0 % 39.6 40.7 38.1(L)  Platelets 150 - 400 K/uL 187 222 214    CMP  Latest Ref Rng & Units 03/12/2021 12/11/2020 10/09/2020  Glucose 70 - 99 mg/dL 103(H) 105(H) 107(H)  BUN 8 - 23 mg/dL 18 25(H) 23  Creatinine 0.61 - 1.24 mg/dL 1.23 1.48(H) 1.30(H)  Sodium 135 - 145 mmol/L 140 140 139  Potassium 3.5 - 5.1 mmol/L 4.4 4.0 3.9  Chloride 98 - 111 mmol/L 104 104 103  CO2 22 - 32 mmol/L '29 27 26  '$ Calcium 8.9 - 10.3 mg/dL 9.6 9.3 9.9  Total Protein 6.5 - 8.1 g/dL 6.9 7.1 7.1  Total Bilirubin 0.3 - 1.2 mg/dL 1.3(H) 1.3(H) 1.0  Alkaline Phos 38 - 126 U/L 54 68 71  AST 15 - 41 U/L '31 23 29  '$ ALT 0 - 44 U/L 5 <5 <5    Lipid Panel     Component Value Date/Time   CHOL 216 (H) 08/17/2019 0445   TRIG 98 08/17/2019  0445   HDL 72 08/17/2019 0445   CHOLHDL 3.0 08/17/2019 0445   VLDL 20 08/17/2019 0445   LDLCALC 124 (H) 08/17/2019 0445    No components found for: NTPROBNP No results for input(s): PROBNP in the last 8760 hours. No results for input(s): TSH in the last 8760 hours.  BMP Recent Labs    05/06/20 0935 07/05/20 1132 10/09/20 1345 12/11/20 1402 03/12/21 1355  NA 142   < > 139 140 140  K 3.7   < > 3.9 4.0 4.4  CL 108   < > 103 104 104  CO2 25   < > '26 27 29  '$ GLUCOSE 108*   < > 107* 105* 103*  BUN 21   < > 23 25* 18  CREATININE 1.60*   < > 1.30* 1.48* 1.23  CALCIUM 9.5   < > 9.9 9.3 9.6  GFRNONAA 41*   < > 56* 48* 60*  GFRAA 47*  --   --   --   --    < > = values in this interval not displayed.    HEMOGLOBIN A1C No results found for: HGBA1C, MPG  IMPRESSION:    ICD-10-CM   1. Atrial flutter, paroxysmal (HCC)  I48.92 EKG 12-Lead    PCV ECHOCARDIOGRAM COMPLETE    PCV MYOCARDIAL PERFUSION WITH LEXISCAN    2. Long term (current) use of anticoagulants  Z79.01     3. Abnormal electrocardiogram  R94.31 PCV MYOCARDIAL PERFUSION WITH LEXISCAN    4. Atherosclerosis of aorta (HCC)  I70.0 Lipid Panel With LDL/HDL Ratio    LDL cholesterol, direct    rosuvastatin (CRESTOR) 10 MG tablet    5. RBBB  I45.10     6. OSA treated with BiPAP  G47.33     7. Kidney donor  Z52.4        RECOMMENDATIONS: [name] [name] is a 80 y.o. 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  Paroxysmal atrial flutter: Rate control: Diltiazem. Rhythm control: N/A. Thromboembolic prophylaxis: Eliquis. CHA2DS2-VASc SCORE is 3 which correlates to 3.2 % risk of stroke per year. Per patient diagnosed in 2020.  No prior history of cardioversion or atrial flutter ablation. EKG  shows normal sinus rhythm.  Long-term oral anticoagulation: Indication: Paroxysmal atrial flutter. No prior history of intracranial bleeding or gastrointestinal bleeding. Does not endorse any evidence of bleeding. Most recent hemoglobin from July 2022 is 12.5  g/dL.  Abnormal EKG: Patient is noted to have T wave inversions in the inferior leads for which underlying ischemia cannot be ruled out.  Clinically does not have any chest pain. His last stress test was in 2016. Since the patient is overall functional and given the EKG findings and cardiovascular risk factors discussed proceeding with Lexiscan to evaluate for reversible ischemia.  Aortic atherosclerosis: Hold off on initiation of aspirin as he is already on oral anticoagulation to decrease his risk of bleeding. Check fasting lipids Start atorvastatin 10 mg p.o. nightly. Prior to his next office visit I would like to order CMP and fasting lipid profile to reevaluate therapy and LFTs.  Records from his prior cardiologist have been requested as a part of today's office visit.  FINAL MEDICATION LIST END OF ENCOUNTER: Meds ordered this encounter  Medications   rosuvastatin (CRESTOR) 10 MG tablet    Sig: Take 1 tablet (10 mg total) by mouth at bedtime.    Dispense:  90 tablet    Refill:  0     Medications Discontinued During This Encounter  Medication Reason   oxyCODONE (OXY IR/ROXICODONE) 5 MG immediate release tablet Error   RELISTOR 150 MG TABS Error   diltiazem (CARDIZEM CD) 120 MG 24 hr capsule Error     Current Outpatient Medications:    Acetaminophen (TYLENOL ARTHRITIS PAIN PO), Take 500 mg by mouth as needed., Disp: , Rfl:    apixaban (ELIQUIS) 5 MG TABS tablet, Take 1 tablet (5 mg total) by mouth 2 (two) times daily., Disp: 60 tablet, Rfl: 1   carbidopa-levodopa (SINEMET IR) 25-100 MG tablet, 3 tablets at 7 AM/2 at 11 AM/2 tablets at 3 PM/2 tablet at 7 PM, Disp: 810 tablet, Rfl: 0   clonazePAM (KLONOPIN) 0.5 MG  tablet, Take 1 tablet by mouth at bedtime., Disp: , Rfl:    diltiazem (CARDIZEM) 30 MG tablet, Take 30 mg by mouth 2 (two) times daily., Disp: , Rfl:    Glycerin-Hypromellose-PEG 400 (VISINE DRY EYE OP), Place 1 drop into both eyes 2 (two) times daily as needed (eye irritation). , Disp: , Rfl:    ketoconazole (NIZORAL) 2 % shampoo, Apply 1 application topically once a week. , Disp: , Rfl:    leuprolide, 6 Month, (ELIGARD) 45 MG injection, Inject 45 mg into the skin every 6 (six) months., Disp: , Rfl:    rosuvastatin (CRESTOR) 10 MG tablet, Take 1 tablet (10 mg total) by mouth at bedtime., Disp: 90 tablet, Rfl: 0   triamcinolone cream (KENALOG) 0.1 %, Apply 1 application topically 2 (two) times daily., Disp: 453.6 g, Rfl: 1  Orders Placed This Encounter  Procedures   Lipid Panel With LDL/HDL Ratio   LDL cholesterol, direct   PCV MYOCARDIAL PERFUSION WITH LEXISCAN   EKG 12-Lead   PCV ECHOCARDIOGRAM COMPLETE    There are no Patient Instructions on file for this visit.   --Continue cardiac medications as reconciled in final medication list. --Return in about 8 weeks (around 06/06/2021) for Follow up, Atrial flutter, Review test results, Lipid. Or sooner if needed. --Continue follow-up with your primary care physician regarding the management of your other chronic comorbid conditions.  Patient's questions and concerns were addressed to his satisfaction. He voices understanding of the instructions provided during this encounter.   This note was created using a voice recognition software as a result there may be grammatical errors inadvertently enclosed that do not reflect the nature of this encounter. Every attempt is made to correct  such errors.  [name] [name], [place], [facility]  Pager: [phone] Office: [phone]

## 2021-04-10 ENCOUNTER — Encounter: Payer: Self-pay | Admitting: Internal Medicine

## 2021-04-11 ENCOUNTER — Encounter: Payer: Self-pay | Admitting: Cardiology

## 2021-04-11 ENCOUNTER — Encounter: Payer: Self-pay | Admitting: Internal Medicine

## 2021-04-11 ENCOUNTER — Ambulatory Visit: Payer: Medicare Other | Admitting: Cardiology

## 2021-04-11 ENCOUNTER — Other Ambulatory Visit: Payer: Self-pay

## 2021-04-11 VITALS — BP 103/67 | HR 61 | Temp 98.6°F | Resp 16 | Ht 64.0 in | Wt 142.0 lb

## 2021-04-11 DIAGNOSIS — I4892 Unspecified atrial flutter: Secondary | ICD-10-CM | POA: Diagnosis not present

## 2021-04-11 DIAGNOSIS — I4510 Unspecified right bundle-branch block: Secondary | ICD-10-CM

## 2021-04-11 DIAGNOSIS — G4733 Obstructive sleep apnea (adult) (pediatric): Secondary | ICD-10-CM

## 2021-04-11 DIAGNOSIS — R9431 Abnormal electrocardiogram [ECG] [EKG]: Secondary | ICD-10-CM | POA: Diagnosis not present

## 2021-04-11 DIAGNOSIS — I700 Atherosclerosis of aorta: Secondary | ICD-10-CM

## 2021-04-11 DIAGNOSIS — Z524 Kidney donor: Secondary | ICD-10-CM

## 2021-04-11 DIAGNOSIS — Z7901 Long term (current) use of anticoagulants: Secondary | ICD-10-CM | POA: Diagnosis not present

## 2021-04-11 MED ORDER — ROSUVASTATIN CALCIUM 10 MG PO TABS: 10.0000 mg | ORAL_TABLET | Freq: Every day | ORAL | 0 refills | Status: DC

## 2021-04-14 DIAGNOSIS — Z8616 Personal history of COVID-19: Secondary | ICD-10-CM | POA: Diagnosis not present

## 2021-04-14 DIAGNOSIS — I700 Atherosclerosis of aorta: Secondary | ICD-10-CM | POA: Diagnosis not present

## 2021-04-15 DIAGNOSIS — M6281 Muscle weakness (generalized): Secondary | ICD-10-CM | POA: Diagnosis not present

## 2021-04-15 DIAGNOSIS — G4733 Obstructive sleep apnea (adult) (pediatric): Secondary | ICD-10-CM | POA: Diagnosis not present

## 2021-04-15 DIAGNOSIS — G20 Parkinson's disease: Secondary | ICD-10-CM | POA: Diagnosis not present

## 2021-04-15 DIAGNOSIS — G4752 REM sleep behavior disorder: Secondary | ICD-10-CM | POA: Diagnosis not present

## 2021-04-15 DIAGNOSIS — M5459 Other low back pain: Secondary | ICD-10-CM | POA: Diagnosis not present

## 2021-04-15 DIAGNOSIS — Z9989 Dependence on other enabling machines and devices: Secondary | ICD-10-CM | POA: Diagnosis not present

## 2021-04-15 DIAGNOSIS — R2689 Other abnormalities of gait and mobility: Secondary | ICD-10-CM | POA: Diagnosis not present

## 2021-04-15 DIAGNOSIS — Z79899 Other long term (current) drug therapy: Secondary | ICD-10-CM | POA: Diagnosis not present

## 2021-04-15 LAB — LIPID PANEL WITH LDL/HDL RATIO
Cholesterol, Total: 183 mg/dL (ref 100–199)
HDL: 69 mg/dL (ref >39)
LDL Chol Calc (NIH): 97 mg/dL (ref 0–99)
LDL/HDL Ratio: 1.4 ratio (ref 0.0–3.6)
Triglycerides: 96 mg/dL (ref 0–149)
VLDL Cholesterol Cal: 17 mg/dL (ref 5–40)

## 2021-04-15 LAB — LDL CHOLESTEROL, DIRECT: LDL Direct: 89 mg/dL (ref 0–99)

## 2021-04-15 NOTE — Progress Notes (Signed)
Assessment/Plan:   1.  Parkinsons Disease  -continue carbidopa/levodopa 25/100, 3 tablets at 7 AM, 2 tablets at 11 AM, 2 tablets at 3 PM, 2 tablets at 7 PM  -Continue carbidopa/levodopa 50/200 CR at bedtime  2.  RBD  -Continue clonazepam 0.5 mg at bedtime  3.  OSAS  -On BiPAP and followed by [facility] neurology.  4.  Atrial flutter  -On Eliquis  -On diltiazem  5.  Low blood pressure  -Due to combination of diltiazem and Parkinson's disease.  Following with cardiology in [place] now.  He is aware of blood pressure issues.  6.  Metastatic prostate cancer  -Following with oncology  7.  Compression fractures.  -Has had several, with kyphoplasty at the L1 level.  Following with pain management.  -he is in PT with [facility] now Subjective:   [name] [name] was seen today in follow up for Parkinsons disease.  My previous records were reviewed prior to todays visit as well as outside records available to me. Pt denies falls.  Using walker most of the time.   Pt denies lightheadedness, near syncope.  No hallucinations.  Mood has been good.  Following with [facility] neurology for sleep apnea.  Just saw the neurologist yesterday.  1-2 incidents of crazy dreams per month - no fall out of the bed.   Following with [facility] cardiology now for atrial flutter.  [facility] cardiology on August 5.  Notes reviewed.  He has moved to [facility] in the last 3 months - independent living.  He is doing PT there and finding it helpful for back pain.    Current prescribed movement disorder medications: carbidopa/levodopa 25/100, 3 tablets at 7 AM/2 at 11 AM/3 PM/2 tablet at 7 PM carbidopa/levodopa 50/200 CR at bed  Clonazepam 0.5 mg at bedtime     PREVIOUS MEDICATIONS: opicapone (took 2 weeks of samples and d/c)    ALLERGIES:   Allergies  Allergen Reactions   Influenza Vaccines Anaphylaxis    Flu shot: 1974 anaphylaxis. 2nd time swollen arm    CURRENT MEDICATIONS:  Outpatient Encounter  Medications as of 04/16/2021  Medication Sig   Acetaminophen (TYLENOL ARTHRITIS PAIN PO) Take 500 mg by mouth as needed.   apixaban (ELIQUIS) 5 MG TABS tablet Take 1 tablet (5 mg total) by mouth 2 (two) times daily.   carbidopa-levodopa (SINEMET IR) 25-100 MG tablet 3 tablets at 7 AM/2 at 11 AM/2 tablets at 3 PM/2 tablet at 7 PM   clonazePAM (KLONOPIN) 0.5 MG tablet Take 1 tablet by mouth at bedtime.   diltiazem (CARDIZEM) 30 MG tablet Take 30 mg by mouth 2 (two) times daily.   Glycerin-Hypromellose-PEG 400 (VISINE DRY EYE OP) Place 1 drop into both eyes 2 (two) times daily as needed (eye irritation).    ketoconazole (NIZORAL) 2 % shampoo Apply 1 application topically once a week.    leuprolide, 6 Month, (ELIGARD) 45 MG injection Inject 45 mg into the skin every 6 (six) months.   rosuvastatin (CRESTOR) 10 MG tablet Take 10 mg by mouth daily.   triamcinolone cream (KENALOG) 0.1 % Apply 1 application topically 2 (two) times daily.   [DISCONTINUED] rosuvastatin (CRESTOR) 10 MG tablet Take 1 tablet (10 mg total) by mouth at bedtime. (Patient not taking: Reported on 04/16/2021)   No facility-administered encounter medications on file as of 04/16/2021.    Objective:   PHYSICAL EXAMINATION:    VITALS:   Vitals:   04/16/21 0931  BP: 100/60  Pulse: 62  SpO2: 97%  Weight: 142 lb (64.4 kg)  Height: '5\' 4"'$  (1.626 m)     GEN:  The patient appears stated age and is in NAD. HEENT:  Normocephalic, atraumatic.  The mucous membranes are moist. The superficial temporal arteries are without ropiness or tenderness. CV:  RRR Lungs:  CTAB Neck/HEME:  There are no carotid bruits bilaterally.  Neurological examination:  Orientation: The patient is alert and oriented x3. Cranial nerves: There is good facial symmetry with facial hypomimia. The speech is fluent and clear. Soft palate rises symmetrically and there is no tongue deviation. Hearing is intact to conversational tone. Sensation: Sensation is  intact to light touch throughout Motor: Strength is at least antigravity x4.  Movement examination: Tone: There is nl tone in the UE/LE Abnormal movements: dyskinesia in the L leg Coordination:  There is mild decremation with RAM's, with any form of RAMS, including alternating supination and pronation of the forearm, hand opening and closing, finger taps, heel taps and toe taps, Gait and Station: The patient arises pretty well without use of the hands.  He is wide-based with ambulation.  I have reviewed and interpreted the following labs independently    Chemistry      Component Value Date/Time   NA 140 03/12/2021 1355   K 4.4 03/12/2021 1355   CL 104 03/12/2021 1355   CO2 29 03/12/2021 1355   BUN 18 03/12/2021 1355   CREATININE 1.23 03/12/2021 1355      Component Value Date/Time   CALCIUM 9.6 03/12/2021 1355   ALKPHOS 54 03/12/2021 1355   AST 31 03/12/2021 1355   ALT 5 03/12/2021 1355   BILITOT 1.3 (H) 03/12/2021 1355       Lab Results  Component Value Date   WBC 6.0 03/12/2021   HGB 12.5 (L) 03/12/2021   HCT 39.6 03/12/2021   MCV 96.1 03/12/2021   PLT 187 03/12/2021    Lab Results  Component Value Date   TSH 1.217 08/16/2019     Total time spent on today's visit was 20 minutes, including both face-to-face time and nonface-to-face time.  Time included that spent on review of records (prior notes available to me/labs/imaging if pertinent), discussing treatment and goals, answering patient's questions and coordinating care.  Cc:  [name], MD

## 2021-04-16 ENCOUNTER — Encounter: Payer: Self-pay | Admitting: Neurology

## 2021-04-16 ENCOUNTER — Ambulatory Visit (INDEPENDENT_AMBULATORY_CARE_PROVIDER_SITE_OTHER): Payer: Medicare Other | Admitting: Neurology

## 2021-04-16 ENCOUNTER — Other Ambulatory Visit: Payer: Self-pay

## 2021-04-16 VITALS — BP 100/60 | HR 62 | Ht 64.0 in | Wt 142.0 lb

## 2021-04-16 DIAGNOSIS — G4752 REM sleep behavior disorder: Secondary | ICD-10-CM | POA: Diagnosis not present

## 2021-04-16 DIAGNOSIS — G20 Parkinson's disease: Secondary | ICD-10-CM | POA: Diagnosis not present

## 2021-04-16 MED ORDER — CARBIDOPA-LEVODOPA ER 50-200 MG PO TBCR: 1.0000 | EXTENDED_RELEASE_TABLET | Freq: Every day | ORAL | 1 refills | Status: DC

## 2021-04-16 MED ORDER — CARBIDOPA-LEVODOPA 25-100 MG PO TABS: ORAL_TABLET | ORAL | 1 refills | Status: DC

## 2021-04-21 DIAGNOSIS — G20 Parkinson's disease: Secondary | ICD-10-CM | POA: Diagnosis not present

## 2021-04-21 DIAGNOSIS — R2689 Other abnormalities of gait and mobility: Secondary | ICD-10-CM | POA: Diagnosis not present

## 2021-04-21 DIAGNOSIS — M5459 Other low back pain: Secondary | ICD-10-CM | POA: Diagnosis not present

## 2021-04-21 DIAGNOSIS — M6281 Muscle weakness (generalized): Secondary | ICD-10-CM | POA: Diagnosis not present

## 2021-04-23 DIAGNOSIS — R2689 Other abnormalities of gait and mobility: Secondary | ICD-10-CM | POA: Diagnosis not present

## 2021-04-23 DIAGNOSIS — G20 Parkinson's disease: Secondary | ICD-10-CM | POA: Diagnosis not present

## 2021-04-23 DIAGNOSIS — M67911 Unspecified disorder of synovium and tendon, right shoulder: Secondary | ICD-10-CM | POA: Diagnosis not present

## 2021-04-23 DIAGNOSIS — M6281 Muscle weakness (generalized): Secondary | ICD-10-CM | POA: Diagnosis not present

## 2021-04-23 DIAGNOSIS — M5459 Other low back pain: Secondary | ICD-10-CM | POA: Diagnosis not present

## 2021-04-30 ENCOUNTER — Ambulatory Visit: Payer: Medicare Other

## 2021-04-30 ENCOUNTER — Other Ambulatory Visit: Payer: Self-pay

## 2021-04-30 DIAGNOSIS — I4892 Unspecified atrial flutter: Secondary | ICD-10-CM

## 2021-04-30 DIAGNOSIS — R9431 Abnormal electrocardiogram [ECG] [EKG]: Secondary | ICD-10-CM

## 2021-05-01 ENCOUNTER — Ambulatory Visit: Payer: Medicare Other | Admitting: Neurology

## 2021-05-05 DIAGNOSIS — M5459 Other low back pain: Secondary | ICD-10-CM | POA: Diagnosis not present

## 2021-05-05 DIAGNOSIS — G20 Parkinson's disease: Secondary | ICD-10-CM | POA: Diagnosis not present

## 2021-05-05 DIAGNOSIS — M6281 Muscle weakness (generalized): Secondary | ICD-10-CM | POA: Diagnosis not present

## 2021-05-05 DIAGNOSIS — R2689 Other abnormalities of gait and mobility: Secondary | ICD-10-CM | POA: Diagnosis not present

## 2021-05-07 DIAGNOSIS — M810 Age-related osteoporosis without current pathological fracture: Secondary | ICD-10-CM | POA: Diagnosis not present

## 2021-05-07 DIAGNOSIS — C61 Malignant neoplasm of prostate: Secondary | ICD-10-CM | POA: Diagnosis not present

## 2021-05-07 DIAGNOSIS — Z23 Encounter for immunization: Secondary | ICD-10-CM | POA: Diagnosis not present

## 2021-05-07 DIAGNOSIS — G4733 Obstructive sleep apnea (adult) (pediatric): Secondary | ICD-10-CM | POA: Diagnosis not present

## 2021-05-07 DIAGNOSIS — I4892 Unspecified atrial flutter: Secondary | ICD-10-CM | POA: Diagnosis not present

## 2021-05-08 ENCOUNTER — Telehealth: Payer: Self-pay | Admitting: Cardiology

## 2021-05-08 DIAGNOSIS — M6281 Muscle weakness (generalized): Secondary | ICD-10-CM | POA: Diagnosis not present

## 2021-05-08 DIAGNOSIS — M5459 Other low back pain: Secondary | ICD-10-CM | POA: Diagnosis not present

## 2021-05-08 DIAGNOSIS — G20 Parkinson's disease: Secondary | ICD-10-CM | POA: Diagnosis not present

## 2021-05-08 DIAGNOSIS — R2689 Other abnormalities of gait and mobility: Secondary | ICD-10-CM | POA: Diagnosis not present

## 2021-05-08 NOTE — Telephone Encounter (Signed)
Pt called wanting to know results from echo & stress test both done on 8/24

## 2021-05-09 ENCOUNTER — Other Ambulatory Visit: Payer: Self-pay | Admitting: Cardiology

## 2021-05-09 DIAGNOSIS — I700 Atherosclerosis of aorta: Secondary | ICD-10-CM

## 2021-05-09 NOTE — Telephone Encounter (Signed)
Sending you the result note as we speak. Please call him back.

## 2021-05-09 NOTE — Progress Notes (Signed)
Called pt to inform him about his stress test results. Pt understood.

## 2021-05-09 NOTE — Telephone Encounter (Signed)
Done

## 2021-05-12 DIAGNOSIS — Z20828 Contact with and (suspected) exposure to other viral communicable diseases: Secondary | ICD-10-CM | POA: Diagnosis not present

## 2021-05-16 ENCOUNTER — Other Ambulatory Visit: Payer: Self-pay

## 2021-05-16 MED ORDER — ROSUVASTATIN CALCIUM 10 MG PO TABS: 10.0000 mg | ORAL_TABLET | Freq: Every day | ORAL | 3 refills | Status: DC

## 2021-05-19 DIAGNOSIS — Z8616 Personal history of COVID-19: Secondary | ICD-10-CM | POA: Diagnosis not present

## 2021-05-20 ENCOUNTER — Other Ambulatory Visit: Payer: Self-pay

## 2021-05-26 ENCOUNTER — Other Ambulatory Visit: Payer: Self-pay

## 2021-05-26 DIAGNOSIS — G20 Parkinson's disease: Secondary | ICD-10-CM

## 2021-05-26 DIAGNOSIS — Z8616 Personal history of COVID-19: Secondary | ICD-10-CM | POA: Diagnosis not present

## 2021-05-26 MED ORDER — CARBIDOPA-LEVODOPA 25-100 MG PO TABS: ORAL_TABLET | ORAL | 3 refills | Status: DC

## 2021-06-02 ENCOUNTER — Other Ambulatory Visit: Payer: Self-pay

## 2021-06-02 DIAGNOSIS — Z20828 Contact with and (suspected) exposure to other viral communicable diseases: Secondary | ICD-10-CM | POA: Diagnosis not present

## 2021-06-02 MED ORDER — CARBIDOPA-LEVODOPA ER 50-200 MG PO TBCR: 1.0000 | EXTENDED_RELEASE_TABLET | Freq: Every day | ORAL | 3 refills | Status: DC

## 2021-06-05 ENCOUNTER — Ambulatory Visit: Payer: Medicare Other | Admitting: Cardiology

## 2021-06-05 ENCOUNTER — Encounter: Payer: Self-pay | Admitting: Cardiology

## 2021-06-05 ENCOUNTER — Other Ambulatory Visit: Payer: Self-pay

## 2021-06-05 VITALS — BP 103/67 | HR 61 | Temp 97.7°F | Resp 16 | Ht 64.0 in | Wt 150.0 lb

## 2021-06-05 DIAGNOSIS — E782 Mixed hyperlipidemia: Secondary | ICD-10-CM | POA: Diagnosis not present

## 2021-06-05 DIAGNOSIS — G4733 Obstructive sleep apnea (adult) (pediatric): Secondary | ICD-10-CM

## 2021-06-05 DIAGNOSIS — I4510 Unspecified right bundle-branch block: Secondary | ICD-10-CM | POA: Diagnosis not present

## 2021-06-05 DIAGNOSIS — Z524 Kidney donor: Secondary | ICD-10-CM | POA: Diagnosis not present

## 2021-06-05 DIAGNOSIS — I4892 Unspecified atrial flutter: Secondary | ICD-10-CM | POA: Diagnosis not present

## 2021-06-05 DIAGNOSIS — I700 Atherosclerosis of aorta: Secondary | ICD-10-CM | POA: Diagnosis not present

## 2021-06-05 DIAGNOSIS — Z7901 Long term (current) use of anticoagulants: Secondary | ICD-10-CM | POA: Diagnosis not present

## 2021-06-05 MED ORDER — DILTIAZEM HCL 30 MG PO TABS: 30.0000 mg | ORAL_TABLET | Freq: Two times a day (BID) | ORAL | 1 refills | Status: DC

## 2021-06-05 NOTE — Progress Notes (Signed)
Date:  06/05/2021   ID:  [name] [name], DOB 16-Apr-1941, MRN [id number]  PCP:  [name], MD  Cardiologist:  Rex Kras, DO, [facility] (established care 04/10/2021) Former Cardiologist: Isaias Cowman, MD  Date: 06/05/21 Last Office Visit: 04/11/2021  Chief Complaint  Patient presents with   Atrial Flutter   Results   Follow-up    HPI  [name] [name] is a 80 y.o. 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  He is referred to the office at the request of [name], MD for evaluation of atrial flutter.  He was under the care of Dr.Alexander Paraschos was managing his atrial flutter that was diagnosed approximately in 2020.  He has been on diltiazem as well as Eliquis for thromboembolic prophylaxis.  He does not endorse any evidence of bleeding.  No prior history of intracranial bleeding or gastrointestinal bleeding.  He has not required ablation and or cardioversion to maintain normal sinus rhythm.  Date of prior office visit an EKG was performed which noted sinus bradycardia with T wave inversions in inferior leads and given his multiple cardiovascular risk factors he was recommended to undergo echocardiogram and stress testing.Results reviewed with him in great detail and noted below for further reference.  Given his hyperlipidemia and aortic atherosclerosis patient was started on Lipitor and he is tolerating the medication well without any side effects or intolerances.  Repeat fasting lipid profile notes marked improvement in LDL levels.  Patient is requesting refills on diltiazem.   FUNCTIONAL STATUS: Walks  about 1.5 miles per day and goes to physical therapy.    ALLERGIES: Allergies  Allergen Reactions   Influenza Vaccines Anaphylaxis    Flu shot: 1974 anaphylaxis. 2nd time swollen arm    MEDICATION LIST PRIOR TO VISIT: Current Meds  Medication Sig   Acetaminophen (TYLENOL ARTHRITIS PAIN PO) Take 500 mg by mouth as needed.   apixaban (ELIQUIS) 5 MG TABS tablet Take 1 tablet (5 mg total) by mouth 2 (two) times daily.   carbidopa-levodopa (SINEMET CR) 50-200 MG tablet Take 1 tablet by mouth at bedtime.   carbidopa-levodopa (SINEMET IR) 25-100 MG tablet 3 tablets at 7 AM/2 at 11 AM/2 tablets at 3 PM/2 tablet at 7 PM   clonazePAM (KLONOPIN) 0.5 MG tablet Take 1 tablet by mouth at bedtime.   Glycerin-Hypromellose-PEG 400 (VISINE DRY EYE OP) Place 1 drop into both eyes 2 (two) times daily as needed (eye irritation).    ketoconazole (NIZORAL) 2 % shampoo Apply 1 application topically once a week.    leuprolide, 6 Month, (ELIGARD) 45 MG injection Inject 45 mg into the skin every 6 (six) months.   rosuvastatin (CRESTOR) 10 MG tablet Take 1 tablet (10 mg total) by mouth daily.   triamcinolone cream (KENALOG) 0.1 % Apply 1 application topically 2 (two) times daily.   [DISCONTINUED] diltiazem (CARDIZEM) 30 MG tablet Take 30 mg by mouth 2 (two) times daily.     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   KYPHOPLASTY N/A 08/15/2020   Procedure: L2 compression fracture;  Surgeon: Hessie Knows, MD;   Location: [facility] ORS;  Service: Orthopedics;  Laterality: N/A;   KYPHOPLASTY N/A 08/29/2020   Procedure: T8 KYPHOPLASTY;  Surgeon: Hessie Knows, MD;  Location: [facility] ORS;  Service: Orthopedics;  Laterality: N/A;   KYPHOPLASTY N/A 11/12/2020   Procedure: L1 KYPHOPLASTY;  Surgeon: Hessie Knows, MD;  Location: [facility] ORS;  Service: Orthopedics;  Laterality: N/A;    FAMILY HISTORY: The patient family history includes Breast cancer in his daughter; Diabetes in his paternal grandfather; Heart disease in his maternal grandfather.  SOCIAL HISTORY:  The patient  reports that he quit smoking about 10 years ago. His smoking use included cigars. He has never used smokeless tobacco. He reports that he does not currently use alcohol. He reports that he does not use drugs.  REVIEW OF SYSTEMS: Review of Systems  Constitutional: Negative for chills and fever.  HENT:  Negative for hoarse voice and nosebleeds.   Eyes:  Negative for discharge, double vision and pain.  Cardiovascular:  Negative for chest pain, claudication, dyspnea on exertion, leg swelling, near-syncope, orthopnea, palpitations, paroxysmal nocturnal dyspnea and syncope.  Respiratory:  Negative for hemoptysis and shortness of breath.   Musculoskeletal:  Negative for muscle cramps and myalgias.  Gastrointestinal:  Negative for abdominal pain, constipation, diarrhea, hematemesis, hematochezia, melena, nausea and vomiting.  Neurological:  Negative for dizziness and light-headedness.   PHYSICAL EXAM: Vitals with BMI 06/05/2021 04/16/2021 04/11/2021  Height 5' 4"  5' 4"  5' 4"   Weight 150 lbs 142 lbs 142 lbs  BMI 25.73 19.41 74.08  Systolic 144 818 563  Diastolic 67 60 67  Pulse 61 62 61    CONSTITUTIONAL: Well-developed and well-nourished. No acute distress.  SKIN: Skin is warm and dry. No rash noted. No cyanosis. No pallor. No jaundice HEAD: Normocephalic and atraumatic.  EYES: No scleral icterus MOUTH/THROAT: Moist oral membranes.  NECK: No JVD  present. No thyromegaly noted. No carotid bruits  LYMPHATIC: No visible cervical adenopathy.  CHEST Normal respiratory effort. No intercostal retractions  LUNGS: Clear to auscultation bilaterally. No stridor. No wheezes. No rales.  CARDIOVASCULAR: Regular rate and rhythm, positive J4-H7, soft holosystolic murmur heard at the apex, no rubs or gallops appreciated. ABDOMINAL: Soft, nontender, nondistended, positive bowel sounds all 4 quadrants.  No apparent ascites.  EXTREMITIES: No peripheral edema, warm extremities, 2+ dorsalis pedis posterior tibial pulses, signs of chronic venous insufficiency. HEMATOLOGIC: No significant bruising NEUROLOGIC: Oriented to person, place, and time. Nonfocal. Normal muscle tone.  PSYCHIATRIC: Normal mood and affect. Normal behavior. Cooperative  CARDIAC DATABASE: EKG: 04/11/2021: Sinus bradycardia, 52 bpm, RBBB, left axis, left anterior fascicular block, T wave inversions in the inferior leads cannot rule out ischemia.   Echocardiogram: 04/30/2021: Normal LV systolic function with EF 63%. Moderate concentric hypertrophy of the left ventricle. Normal global wall motion. Left ventricle cavity is normal in size.  Doppler evidence of grade II (pseudonormal) diastolic dysfunction, elevated LAP.  Left atrial cavity is moderately dilated. Trileaflet aortic valve. Moderate (Grade II) aortic regurgitation. Mild to moderate mitral regurgitation. Mild tricuspid regurgitation.  No evidence of pulmonary hypertension.   Stress Testing: Lexiscan Tetrofosmin stress test 04/30/2021: 1 Day Rest/Stress Protocol. Stress EKG is non-diagnostic for ischemia as its a pharmacologic stress test using Lexiscan. Normal myocardial perfusion without convincing evidence of reversible myocardial ischemia or prior infarct.   Left ventricular ejection fraction is 55% with normal wall motion.   Low risk study. No prior studies for comparison.   Heart Catheterization: None  LABORATORY  DATA: CBC Latest Ref Rng & Units 03/12/2021 12/11/2020 10/09/2020  WBC 4.0 - 10.5 K/uL 6.0 8.6 6.0  Hemoglobin 13.0 - 17.0 g/dL 12.5(L) 13.3 12.2(L)  Hematocrit 39.0 - 52.0 % 39.6 40.7 38.1(L)  Platelets 150 - 400 K/uL 187 222 214    CMP Latest Ref Rng & Units 03/12/2021 12/11/2020 10/09/2020  Glucose 70 - 99 mg/dL 103(H) 105(H) 107(H)  BUN 8 - 23 mg/dL 18 25(H) 23  Creatinine 0.61 - 1.24 mg/dL 1.23 1.48(H) 1.30(H)  Sodium 135 - 145 mmol/L 140 140 139  Potassium 3.5 - 5.1 mmol/L 4.4 4.0 3.9  Chloride 98 - 111 mmol/L 104 104 103  CO2 22 - 32 mmol/L 29 27 26   Calcium 8.9 - 10.3 mg/dL 9.6 9.3 9.9  Total Protein 6.5 - 8.1 g/dL 6.9 7.1 7.1  Total Bilirubin 0.3 - 1.2 mg/dL 1.3(H) 1.3(H) 1.0  Alkaline Phos 38 - 126 U/L 54 68 71  AST 15 - 41 U/L 31 23 29   ALT 0 - 44 U/L 5 <5 <5    Lipid Panel     Component Value Date/Time   CHOL 183 04/14/2021 0823   TRIG 96 04/14/2021 0823   HDL 69 04/14/2021 0823   CHOLHDL 3.0 08/17/2019 0445   VLDL 20 08/17/2019 0445   LDLCALC 97 04/14/2021 0823   LDLDIRECT 89 04/14/2021 0823   LABVLDL 17 04/14/2021 0823    No components found for: NTPROBNP No results for input(s): PROBNP in the last 8760 hours. No results for input(s): TSH in the last 8760 hours.  BMP Recent Labs    10/09/20 1345 12/11/20 1402 03/12/21 1355  NA 139 140 140  K 3.9 4.0 4.4  CL 103 104 104  CO2 26 27 29   GLUCOSE 107* 105* 103*  BUN 23 25* 18  CREATININE 1.30* 1.48* 1.23  CALCIUM 9.9 9.3 9.6  GFRNONAA 56* 48* 60*    HEMOGLOBIN A1C No results found for: HGBA1C, MPG  IMPRESSION:    ICD-10-CM   1. Atrial flutter, paroxysmal (HCC)  I48.92 diltiazem (CARDIZEM) 30 MG tablet    2. Long term (current) use of anticoagulants  Z79.01 Hemoglobin and hematocrit, blood    3. Mixed hyperlipidemia  E78.2 Lipid Panel With LDL/HDL Ratio    LDL cholesterol, direct    CMP14+EGFR    4. Atherosclerosis of aorta (HCC)  I70.0 Lipid Panel With LDL/HDL Ratio    LDL cholesterol, direct     CMP14+EGFR    5. RBBB  I45.10     6. OSA treated with BiPAP  G47.33     7. Kidney donor  Z52.4        RECOMMENDATIONS: KHAMAURI BAUERNFEIND is a 80 y.o. 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  Paroxysmal atrial flutter: Rate control: Diltiazem. Rhythm control: N/A. Thromboembolic prophylaxis: Eliquis. CHA2DS2-VASc SCORE is 3 which correlates to 3.2 %  risk of stroke per year. Per patient diagnosed in 2020.  No prior history of cardioversion or atrial flutter ablation. Refilled diltiazem.  Long-term oral anticoagulation: Indication: Paroxysmal atrial flutter. No prior history of intracranial bleeding or gastrointestinal bleeding. Does not endorse any evidence of bleeding. Most recent hemoglobin from July 2022 is 12.5 g/dL.  Abnormal EKG: Noted to have T wave inversions in the inferior leads and given his other cardiovascular risk factors underwent nuclear stress test to evaluate for reversible ischemia.  Study was overall low risk study without convincing evidence of reversible myocardial ischemia or prior infarct. Continue medical therapy and addressing cardiovascular risk factors.  Aortic atherosclerosis: Hold off on initiation of aspirin as he is already on oral anticoagulation to decrease his risk of bleeding. Was started on Lipitor during prior visits.  Tolerating medication well.  Follow-up lipid profile notes significant improvement in LDL levels. Will recheck fasting lipid profile and CMP in 6 months prior to next visit.  FINAL MEDICATION LIST END OF ENCOUNTER: Meds ordered this encounter  Medications   diltiazem (CARDIZEM) 30 MG tablet    Sig: Take 1 tablet (30 mg total) by mouth 2 (two) times daily.    Dispense:  180 tablet    Refill:  1      Medications Discontinued During This Encounter  Medication Reason   diltiazem (CARDIZEM) 30 MG tablet Reorder     Current Outpatient Medications:    Acetaminophen (TYLENOL ARTHRITIS PAIN PO), Take 500 mg by mouth as needed., Disp: , Rfl:    apixaban (ELIQUIS) 5 MG TABS tablet, Take 1 tablet (5 mg total) by mouth 2 (two) times daily., Disp: 60 tablet, Rfl: 1   carbidopa-levodopa (SINEMET CR) 50-200 MG tablet, Take 1 tablet by mouth at bedtime., Disp: 60 tablet, Rfl: 3   carbidopa-levodopa (SINEMET IR) 25-100 MG tablet, 3 tablets at 7 AM/2 at 11 AM/2 tablets at 3 PM/2 tablet at 7 PM, Disp: 810 tablet, Rfl: 3   clonazePAM (KLONOPIN) 0.5 MG tablet, Take 1 tablet by mouth at bedtime., Disp: , Rfl:    Glycerin-Hypromellose-PEG 400 (VISINE DRY EYE OP), Place 1 drop into both eyes 2 (two) times daily as needed (eye irritation). , Disp: , Rfl:    ketoconazole (NIZORAL) 2 % shampoo, Apply 1 application topically once a week. , Disp: , Rfl:    leuprolide, 6 Month, (ELIGARD) 45 MG injection, Inject 45 mg into the skin every 6 (six) months., Disp: , Rfl:    rosuvastatin (CRESTOR) 10 MG tablet, Take 1 tablet (10 mg total) by mouth daily., Disp: 90 tablet, Rfl: 3   triamcinolone cream (KENALOG) 0.1 %, Apply 1 application topically 2 (two) times daily., Disp: 453.6 g, Rfl: 1   diltiazem (CARDIZEM) 30 MG tablet, Take 1 tablet (30 mg total) by mouth 2 (two) times daily., Disp: 180 tablet, Rfl: 1  Orders Placed This Encounter  Procedures   Lipid Panel With LDL/HDL Ratio   LDL cholesterol, direct   CMP14+EGFR   Hemoglobin and hematocrit, blood    There are no Patient Instructions on file for this visit.   --Continue cardiac medications as reconciled in final medication list. --Return in about 6 months (around 12/03/2021) for Follow up, Atrial flutter. Or sooner if needed. --Continue follow-up with your primary care physician regarding the management of your other chronic comorbid  conditions.  Patient's questions and concerns were addressed to his satisfaction. He voices understanding of the instructions provided during this encounter.   This note was created using  a voice recognition software as a result there may be grammatical errors inadvertently enclosed that do not reflect the nature of this encounter. Every attempt is made to correct such errors.  Rex Kras, [place], [facility]  Pager: [phone] Office: [phone]

## 2021-06-09 DIAGNOSIS — Z8616 Personal history of COVID-19: Secondary | ICD-10-CM | POA: Diagnosis not present

## 2021-06-11 ENCOUNTER — Other Ambulatory Visit: Payer: Self-pay

## 2021-06-11 ENCOUNTER — Inpatient Hospital Stay: Payer: Medicare Other | Attending: Internal Medicine

## 2021-06-11 DIAGNOSIS — R635 Abnormal weight gain: Secondary | ICD-10-CM | POA: Insufficient documentation

## 2021-06-11 DIAGNOSIS — C7951 Secondary malignant neoplasm of bone: Secondary | ICD-10-CM | POA: Insufficient documentation

## 2021-06-11 DIAGNOSIS — G20 Parkinson's disease: Secondary | ICD-10-CM | POA: Diagnosis not present

## 2021-06-11 DIAGNOSIS — Z9049 Acquired absence of other specified parts of digestive tract: Secondary | ICD-10-CM | POA: Insufficient documentation

## 2021-06-11 DIAGNOSIS — R5383 Other fatigue: Secondary | ICD-10-CM | POA: Diagnosis not present

## 2021-06-11 DIAGNOSIS — K5900 Constipation, unspecified: Secondary | ICD-10-CM | POA: Diagnosis not present

## 2021-06-11 DIAGNOSIS — C61 Malignant neoplasm of prostate: Secondary | ICD-10-CM | POA: Insufficient documentation

## 2021-06-11 DIAGNOSIS — Z803 Family history of malignant neoplasm of breast: Secondary | ICD-10-CM | POA: Insufficient documentation

## 2021-06-11 DIAGNOSIS — R3915 Urgency of urination: Secondary | ICD-10-CM | POA: Diagnosis not present

## 2021-06-11 DIAGNOSIS — Z833 Family history of diabetes mellitus: Secondary | ICD-10-CM | POA: Diagnosis not present

## 2021-06-11 DIAGNOSIS — Z8249 Family history of ischemic heart disease and other diseases of the circulatory system: Secondary | ICD-10-CM | POA: Diagnosis not present

## 2021-06-11 DIAGNOSIS — Z79899 Other long term (current) drug therapy: Secondary | ICD-10-CM | POA: Diagnosis not present

## 2021-06-11 DIAGNOSIS — N400 Enlarged prostate without lower urinary tract symptoms: Secondary | ICD-10-CM | POA: Diagnosis not present

## 2021-06-11 DIAGNOSIS — M549 Dorsalgia, unspecified: Secondary | ICD-10-CM | POA: Insufficient documentation

## 2021-06-11 DIAGNOSIS — Z887 Allergy status to serum and vaccine status: Secondary | ICD-10-CM | POA: Insufficient documentation

## 2021-06-11 DIAGNOSIS — Z7901 Long term (current) use of anticoagulants: Secondary | ICD-10-CM | POA: Insufficient documentation

## 2021-06-11 DIAGNOSIS — R251 Tremor, unspecified: Secondary | ICD-10-CM | POA: Diagnosis not present

## 2021-06-11 DIAGNOSIS — Z87891 Personal history of nicotine dependence: Secondary | ICD-10-CM | POA: Diagnosis not present

## 2021-06-11 LAB — COMPREHENSIVE METABOLIC PANEL
ALT: <5 U/L (ref 0–44)
AST: 25 U/L (ref 15–41)
Albumin: 4.1 g/dL (ref 3.5–5.0)
Alkaline Phosphatase: 50 U/L (ref 38–126)
Anion gap: 6 (ref 5–15)
BUN: 19 mg/dL (ref 8–23)
CO2: 28 mmol/L (ref 22–32)
Calcium: 9.3 mg/dL (ref 8.9–10.3)
Chloride: 104 mmol/L (ref 98–111)
Creatinine, Ser: 1.19 mg/dL (ref 0.61–1.24)
GFR, Estimated: >60 mL/min (ref >60)
Glucose, Bld: 106 mg/dL — ABNORMAL HIGH (ref 70–99)
Potassium: 4 mmol/L (ref 3.5–5.1)
Sodium: 138 mmol/L (ref 135–145)
Total Bilirubin: 1.2 mg/dL (ref 0.3–1.2)
Total Protein: 7 g/dL (ref 6.5–8.1)

## 2021-06-11 LAB — CBC WITH DIFFERENTIAL/PLATELET
Abs Immature Granulocytes: 0.01 10*3/uL (ref 0.00–0.07)
Basophils Absolute: 0.1 10*3/uL (ref 0.0–0.1)
Basophils Relative: 1 %
Eosinophils Absolute: 0.2 10*3/uL (ref 0.0–0.5)
Eosinophils Relative: 3 %
HCT: 40.8 % (ref 39.0–52.0)
Hemoglobin: 13.1 g/dL (ref 13.0–17.0)
Immature Granulocytes: 0 %
Lymphocytes Relative: 31 %
Lymphs Abs: 1.9 10*3/uL (ref 0.7–4.0)
MCH: 30.4 pg (ref 26.0–34.0)
MCHC: 32.1 g/dL (ref 30.0–36.0)
MCV: 94.7 fL (ref 80.0–100.0)
Monocytes Absolute: 0.6 10*3/uL (ref 0.1–1.0)
Monocytes Relative: 10 %
Neutro Abs: 3.3 10*3/uL (ref 1.7–7.7)
Neutrophils Relative %: 55 %
Platelets: 175 10*3/uL (ref 150–400)
RBC: 4.31 MIL/uL (ref 4.22–5.81)
RDW: 12.7 % (ref 11.5–15.5)
WBC: 6 10*3/uL (ref 4.0–10.5)
nRBC: 0 % (ref 0.0–0.2)

## 2021-06-11 LAB — PSA: Prostatic Specific Antigen: 0.06 ng/mL (ref 0.00–4.00)

## 2021-06-13 ENCOUNTER — Encounter: Payer: Self-pay | Admitting: Internal Medicine

## 2021-06-13 ENCOUNTER — Other Ambulatory Visit: Payer: Self-pay

## 2021-06-13 ENCOUNTER — Inpatient Hospital Stay (HOSPITAL_BASED_OUTPATIENT_CLINIC_OR_DEPARTMENT_OTHER): Payer: Medicare Other | Admitting: Internal Medicine

## 2021-06-13 DIAGNOSIS — G20 Parkinson's disease: Secondary | ICD-10-CM | POA: Diagnosis not present

## 2021-06-13 DIAGNOSIS — C61 Malignant neoplasm of prostate: Secondary | ICD-10-CM

## 2021-06-13 DIAGNOSIS — C7951 Secondary malignant neoplasm of bone: Secondary | ICD-10-CM

## 2021-06-13 DIAGNOSIS — R5383 Other fatigue: Secondary | ICD-10-CM | POA: Diagnosis not present

## 2021-06-13 DIAGNOSIS — R635 Abnormal weight gain: Secondary | ICD-10-CM | POA: Diagnosis not present

## 2021-06-13 DIAGNOSIS — M549 Dorsalgia, unspecified: Secondary | ICD-10-CM | POA: Diagnosis not present

## 2021-06-13 NOTE — Progress Notes (Signed)
[facility] NOTE  Patient Care Team: [name], MD as PCP - General (Internal Medicine) Tat, Eustace Quail, DO as Consulting Physician (Neurology) Cammie Sickle, MD as Consulting Physician (Hematology and Oncology)  CHIEF COMPLAINTS/PURPOSE OF CONSULTATION: PROSTATE CANCER  #  Oncology History Overview Note  # PROSTATE CANCER- METATSTATIC to BONE; PSA- 26.5. Sclerotic 1.5 cm left ischial lesion/ Sclerotic medial left iliac bone 1.5 cm lesion (series 2/image 47), increased from 1.0 cm. Gleason score of 4+5= 9; with almost all cores involved greater than 80%.  9/16 Lupron 50-month depot on 9/16. [Urology; Dr.Siniski]  # MID OCT 2020- Zytiga 1000 mg+ prednisone; stopped December 2021 [poor tolerance if with RVR]; DISCONTINUED.   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       Prostate cancer metastatic to bone ([place])  05/24/2019 Initial Diagnosis   Prostate cancer metastatic to bone ([place])    HISTORY OF PRESENTING ILLNESS: Ambulating independently.;  Accompanied by daughter. [name] [name] 80 y.o.  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   Denies any nausea vomiting abdominal pain.  He continues to have moderate back pain from compression fracture.  Limited mobility-requesting handicap parking pass.  Gaining weight.  No hospitalizations.  Review of Systems  Constitutional:  Positive for malaise/fatigue. Negative for chills, diaphoresis, fever and weight loss.  HENT:  Negative for nosebleeds and sore throat.   Eyes:  Negative for double vision.  Respiratory:  Negative for cough, hemoptysis, sputum production, shortness of breath and wheezing.    Cardiovascular:  Negative for chest pain, palpitations, orthopnea and leg swelling.  Gastrointestinal:  Positive for constipation. Negative for abdominal pain, blood in stool, diarrhea, heartburn, melena, nausea and vomiting.  Genitourinary:  Positive for frequency and urgency. Negative for dysuria.  Musculoskeletal:  Negative for back pain and joint pain.  Skin: Negative.  Negative for itching and rash.  Neurological:  Positive for tremors. Negative for dizziness, tingling, focal weakness, weakness and headaches.  Endo/Heme/Allergies:  Does not bruise/bleed easily.  Psychiatric/Behavioral:  Negative for depression. The patient is not nervous/anxious and does not have insomnia.    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   KYPHOPLASTY N/A 08/15/2020   Procedure: L2 compression fracture;  Surgeon: Hessie Knows, MD;  Location: [facility] ORS;  Service: Orthopedics;  Laterality: N/A;   KYPHOPLASTY N/A 08/29/2020   Procedure: T8 KYPHOPLASTY;  Surgeon: Hessie Knows, MD;  Location: [facility] ORS;  Service: Orthopedics;  Laterality: N/A;   KYPHOPLASTY N/A 11/12/2020   Procedure: L1 KYPHOPLASTY;  Surgeon: Hessie Knows, MD;  Location: [facility] ORS;  Service: Orthopedics;  Laterality: N/A;    SOCIAL HISTORY: Social History   Socioeconomic  History   Marital status: Widowed    Spouse name: [name] [name]   Number of children: 5   Years of education: Not on file   Highest education level: Not on file  Occupational History    Occupation: retired    Comment: Nutritional therapist  Tobacco Use   Smoking status: Former    Types: Cigars    Quit date: 09/07/2010    Years since quitting: 10.7   Smokeless tobacco: Never  Vaping Use   Vaping Use: Never used  Substance and Sexual Activity   Alcohol use: Not Currently    Comment: 1 can beer/day   Drug use: Never   Sexual activity: Not Currently  Other Topics Concern   Not on file  Social History Narrative   Lives in [place]; quit smoking in 2012; ocassional alcohol; Camera operator; wife; 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    Social Determinants of Health   Financial Resource Strain: Not on file  Food Insecurity: Not on file  Transportation Needs: Not on file  Physical Activity: Not on file  Stress: Not on file  Social Connections: Not on file  Intimate Partner Violence: Not on file    FAMILY HISTORY: Family History  Problem Relation Age of Onset   Heart disease Maternal Grandfather    Diabetes Paternal Grandfather    Breast cancer Daughter     ALLERGIES:  is allergic to influenza vaccines.  MEDICATIONS:  Current Outpatient Medications  Medication Sig Dispense Refill   Acetaminophen (TYLENOL ARTHRITIS PAIN PO) Take 500 mg by mouth as needed.     apixaban (ELIQUIS) 5 MG TABS tablet Take 1 tablet (5 mg total) by mouth 2 (two) times daily. 60 tablet 1   carbidopa-levodopa (SINEMET CR) 50-200 MG tablet Take 1 tablet by mouth at bedtime. 60 tablet 3   carbidopa-levodopa (SINEMET IR) 25-100 MG tablet 3 tablets at 7 AM/2 at 11 AM/2 tablets at 3 PM/2 tablet at 7 PM 810 tablet 3   clonazePAM (KLONOPIN) 0.5 MG tablet Take 1 tablet by mouth at bedtime.     diltiazem (CARDIZEM) 30 MG tablet Take 1 tablet (30 mg total) by mouth 2 (two) times daily. 180 tablet 1   Glycerin-Hypromellose-PEG 400 (VISINE DRY EYE OP) Place 1 drop into both eyes 2 (two) times daily as needed (eye irritation).      ketoconazole (NIZORAL) 2 % shampoo Apply 1 application topically once a week.       leuprolide, 6 Month, (ELIGARD) 45 MG injection Inject 45 mg into the skin every 6 (six) months.     rosuvastatin (CRESTOR) 10 MG tablet Take 1 tablet (10 mg total) by mouth daily. 90 tablet 3   triamcinolone cream (KENALOG) 0.1 % Apply 1 application topically 2 (two) times daily. 453.6 g 1   No current facility-administered medications for this visit.      Marland Kitchen  PHYSICAL EXAMINATION: ECOG PERFORMANCE STATUS: 0 - Asymptomatic  Vitals:   06/13/21 1449  BP: 114/63  Pulse: 60  Resp: 19  Temp: 97.8 F (36.6 C)  SpO2: 99%   Filed Weights   06/13/21 1449  Weight: 152 lb 3.2 oz (69 kg)    Physical Exam Constitutional:      Comments: Frail-appearing Caucasian male patient.  Ambulating with a walker.  Alone.  HENT:     Head: Normocephalic and atraumatic.     Mouth/Throat:     Pharynx: No oropharyngeal exudate.  Eyes:     Pupils: Pupils are equal, round, and reactive to light.  Cardiovascular:     Rate and Rhythm: Normal rate and regular rhythm.  Pulmonary:     Effort: Pulmonary effort is normal. No respiratory distress.     Breath sounds: Normal breath sounds. No wheezing.  Abdominal:     General: Bowel sounds are normal. There is no distension.     Palpations: Abdomen is soft. There is no mass.     Tenderness: There is no abdominal tenderness. There is no guarding or rebound.  Musculoskeletal:        General: No tenderness. Normal range of motion.     Cervical back: Normal range of motion and neck supple.  Skin:    General: Skin is warm.  Neurological:     Mental Status: He is alert and oriented to person, place, and time.     Comments: Tremor of right upper extremity.  Psychiatric:        Mood and Affect: Affect normal.     LABORATORY DATA:  I have reviewed the data as listed Lab Results  Component Value Date   WBC 6.0 06/11/2021   HGB 13.1 06/11/2021   HCT 40.8 06/11/2021   MCV 94.7 06/11/2021   PLT 175 06/11/2021   Recent Labs    12/11/20 1402  03/12/21 1355 06/11/21 1411  NA 140 140 138  K 4.0 4.4 4.0  CL 104 104 104  CO2 27 29 28   GLUCOSE 105* 103* 106*  BUN 25* 18 19  CREATININE 1.48* 1.23 1.19  CALCIUM 9.3 9.6 9.3  GFRNONAA 48* 60* >60  PROT 7.1 6.9 7.0  ALBUMIN 4.2 3.9 4.1  AST 23 31 25   ALT <5 5 <5  ALKPHOS 68 54 50  BILITOT 1.3* 1.3* 1.2    RADIOGRAPHIC STUDIES: I have personally reviewed the radiological images as listed and agreed with the findings in the report. No results found.  ASSESSMENT & PLAN:   No problem-specific Assessment & Plan notes found for this encounter.  All questions were answered. The patient knows to call the clinic with any problems, questions or concerns.    Cammie Sickle, MD 06/13/2021 4:15 PM

## 2021-06-13 NOTE — Assessment & Plan Note (Signed)
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 On Eligard [urology/ q73m;last July 2022] OCT 2022-0.06 trending down-clinically STABLE.  Continue Eligard every 6 months.  Discussed with urology; we will take over/starting February 23.  Discussed with urology.  #If patient has progressive disease/castrate resistant prostate cancer-recommend Xtandi or apalutamide [poor tolerance to Zytiga]  #A. Fib-chronic.  Rate controlled.  Continue Eliquis.  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   # HOt flashes-grade 1-STABLE  #December 2021-bone density osteoporosis/T score -3; L4 compression fracture/back pain-s/p kyphoplasty [reclast q 12 month- Jan 2022]; pain management physician.  Again due in January 2023.  #Handicap parking: Given patient's ongoing back pain multiple fractures-reasonable.   # DISPOSITION: # follow up in 4 months- MD;  Labs-1-2 day prior- cbc/cmp/PSA; Eligard; Reclast--Dr.B

## 2021-06-17 ENCOUNTER — Ambulatory Visit
Admission: RE | Admit: 2021-06-17 | Discharge: 2021-06-17 | Disposition: A | Discharge status: Home / Self Care | Payer: Medicare Other | Source: Ambulatory Visit | Attending: Internal Medicine | Admitting: Internal Medicine

## 2021-06-17 ENCOUNTER — Other Ambulatory Visit: Payer: Self-pay | Admitting: Internal Medicine

## 2021-06-17 DIAGNOSIS — I4892 Unspecified atrial flutter: Secondary | ICD-10-CM | POA: Diagnosis not present

## 2021-06-17 DIAGNOSIS — R0609 Other forms of dyspnea: Secondary | ICD-10-CM

## 2021-06-23 DIAGNOSIS — Z20828 Contact with and (suspected) exposure to other viral communicable diseases: Secondary | ICD-10-CM | POA: Diagnosis not present

## 2021-06-25 ENCOUNTER — Other Ambulatory Visit: Payer: Self-pay

## 2021-06-25 DIAGNOSIS — I700 Atherosclerosis of aorta: Secondary | ICD-10-CM

## 2021-06-25 DIAGNOSIS — C61 Malignant neoplasm of prostate: Secondary | ICD-10-CM

## 2021-06-25 DIAGNOSIS — E782 Mixed hyperlipidemia: Secondary | ICD-10-CM

## 2021-06-25 DIAGNOSIS — Z7901 Long term (current) use of anticoagulants: Secondary | ICD-10-CM

## 2021-07-07 DIAGNOSIS — Z8616 Personal history of COVID-19: Secondary | ICD-10-CM | POA: Diagnosis not present

## 2021-07-20 IMAGING — RF DG LUMBAR SPINE 2-3V
2 series · 2 of 2 positions shown · non-contrast
Comparison: Lumbar spine MRI 07/06/2020.

CLINICAL DATA: Surgery, elective. Additional history provided: L2
kyphoplasty. Provided fluoroscopy time 1 minutes, 20 seconds.

EXAM:
LUMBAR SPINE - 2-3 VIEW; DG C-ARM 1-60 MIN

[Series 1: dg x-ray · 0.14mm/px · 1 of 1 slices shown (1 of 2)]
[im 1/1]
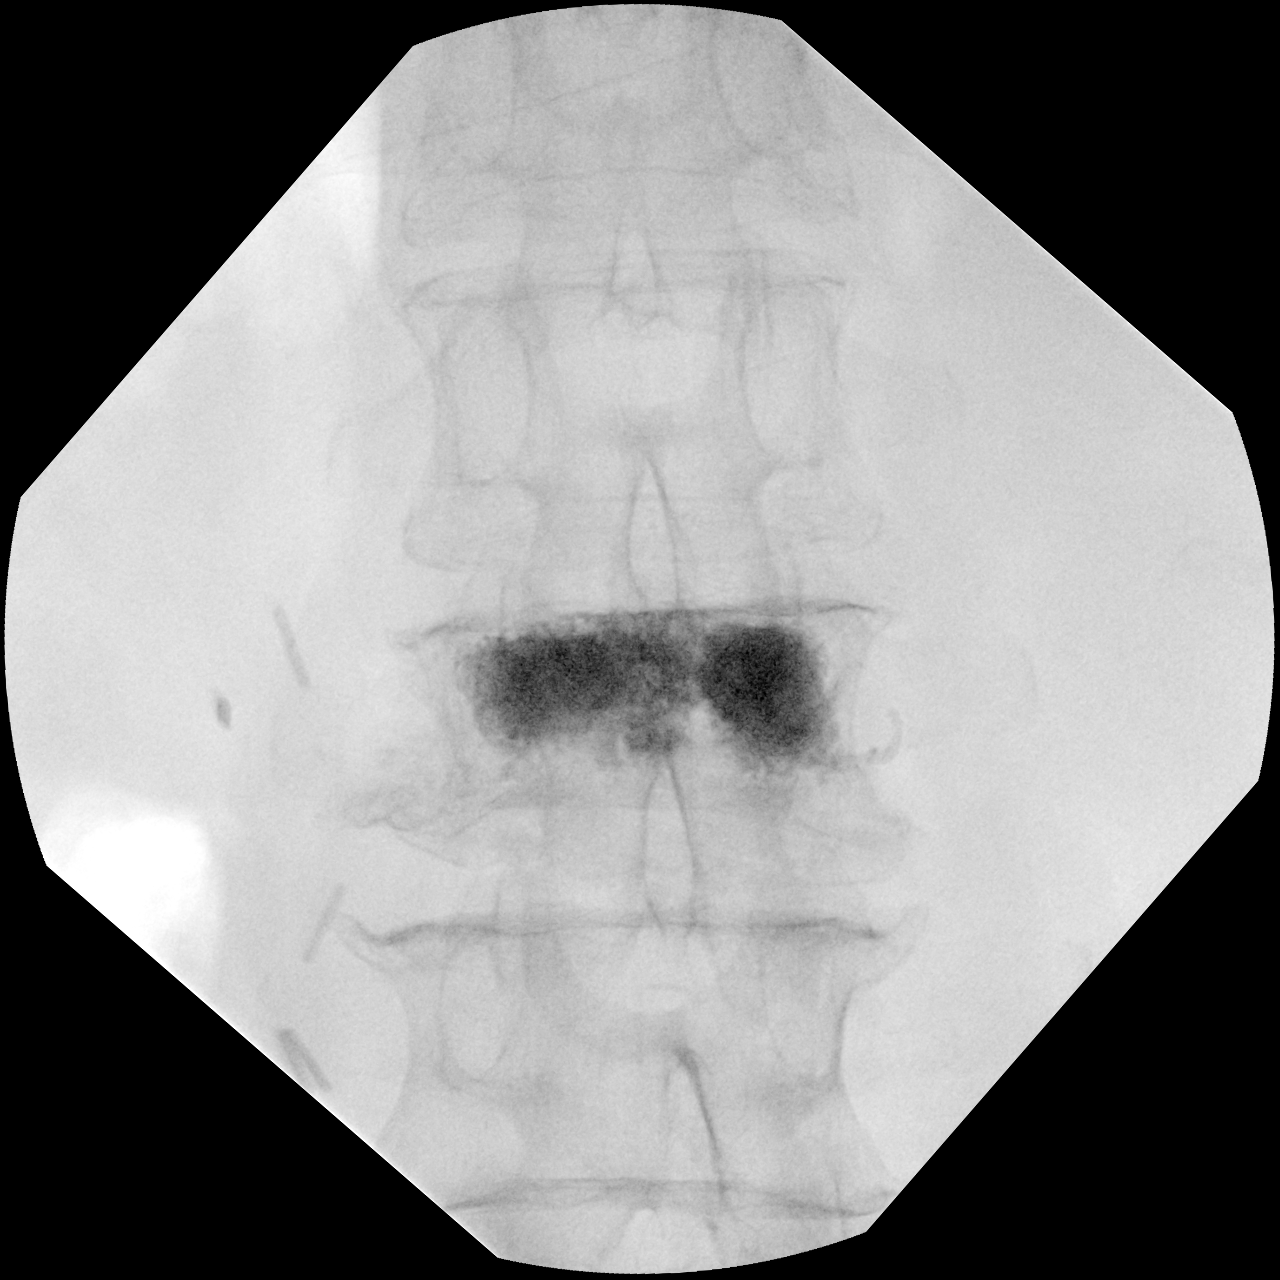

[Series 1: dg x-ray · 0.20mm/px · 1 of 1 slices shown (2 of 2)]
[im 1/1]
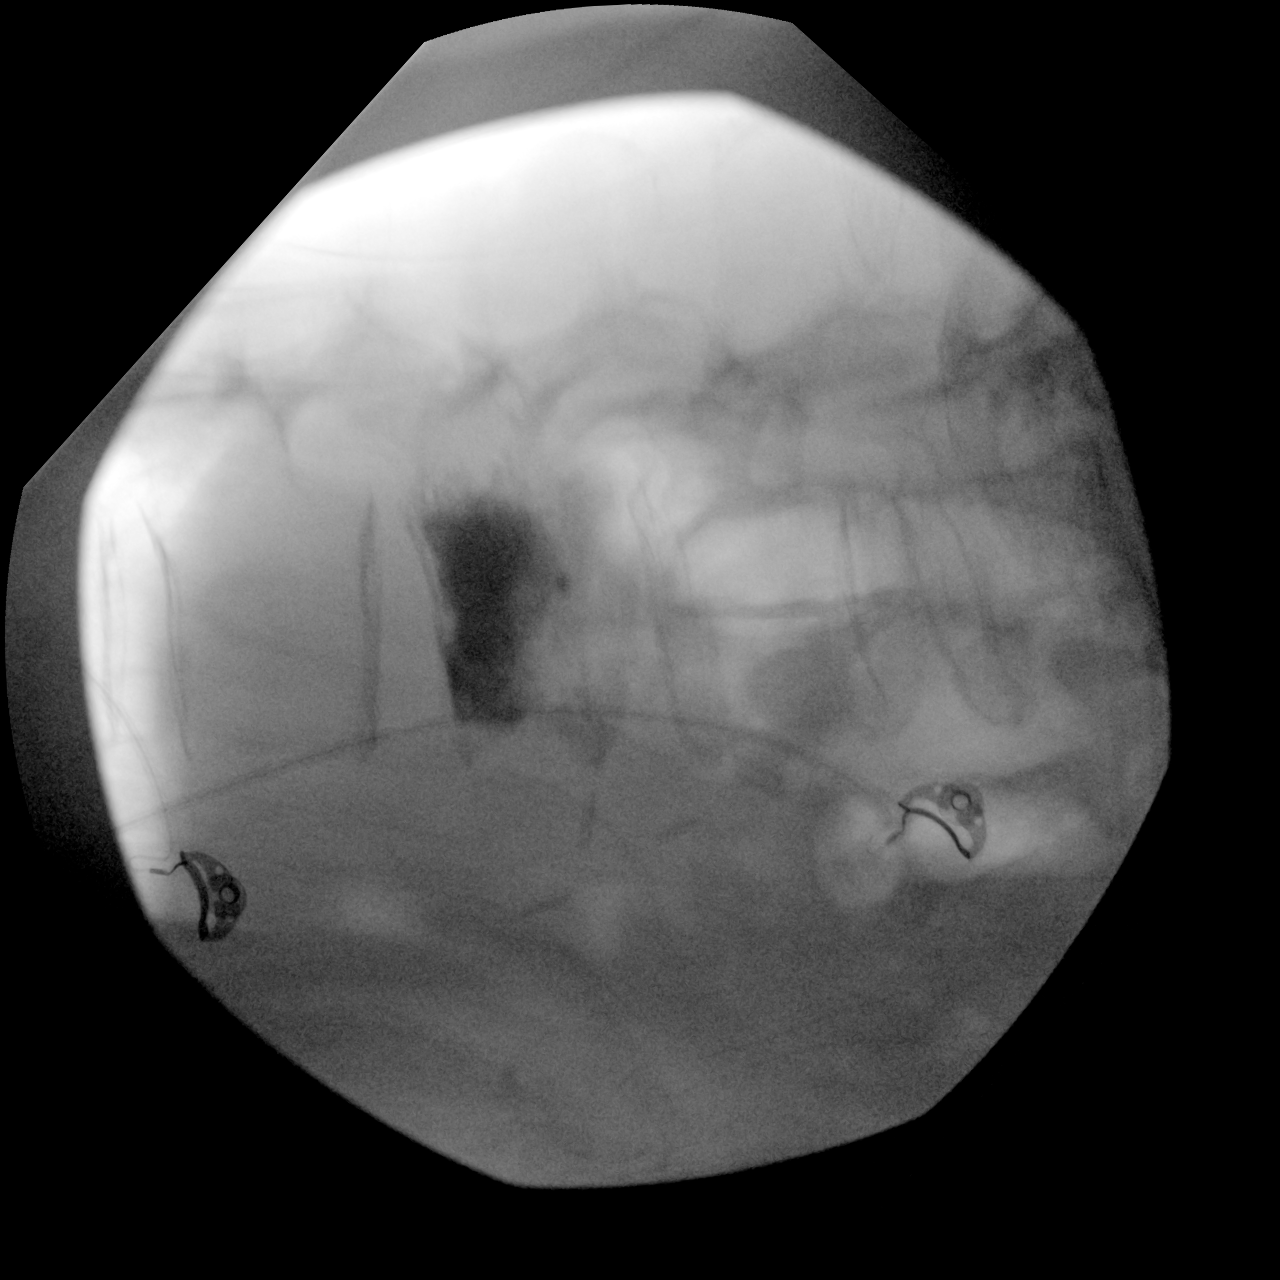

[2 of 2 positions shown; findings below may reference images not displayed]

FINDINGS: PA and lateral view intraoperative fluoroscopic images of the lumbar
spine are submitted, two images total. The images demonstrate
kyphoplasty material within the L2 vertebral body at site of a known
compression fracture.
IMPRESSION: Two intraoperative fluoroscopic images from L2 kyphoplasty, as
described.

## 2021-07-21 DIAGNOSIS — Z20822 Contact with and (suspected) exposure to covid-19: Secondary | ICD-10-CM | POA: Diagnosis not present

## 2021-07-23 DIAGNOSIS — G4752 REM sleep behavior disorder: Secondary | ICD-10-CM | POA: Diagnosis not present

## 2021-07-23 DIAGNOSIS — Z79899 Other long term (current) drug therapy: Secondary | ICD-10-CM | POA: Diagnosis not present

## 2021-07-23 DIAGNOSIS — G4733 Obstructive sleep apnea (adult) (pediatric): Secondary | ICD-10-CM | POA: Diagnosis not present

## 2021-07-23 DIAGNOSIS — Z1389 Encounter for screening for other disorder: Secondary | ICD-10-CM | POA: Diagnosis not present

## 2021-07-23 DIAGNOSIS — I4892 Unspecified atrial flutter: Secondary | ICD-10-CM | POA: Diagnosis not present

## 2021-07-23 DIAGNOSIS — C61 Malignant neoplasm of prostate: Secondary | ICD-10-CM | POA: Diagnosis not present

## 2021-07-23 DIAGNOSIS — G20 Parkinson's disease: Secondary | ICD-10-CM | POA: Diagnosis not present

## 2021-07-23 DIAGNOSIS — E785 Hyperlipidemia, unspecified: Secondary | ICD-10-CM | POA: Diagnosis not present

## 2021-07-23 DIAGNOSIS — Z0000 Encounter for general adult medical examination without abnormal findings: Secondary | ICD-10-CM | POA: Diagnosis not present

## 2021-07-23 DIAGNOSIS — C7951 Secondary malignant neoplasm of bone: Secondary | ICD-10-CM | POA: Diagnosis not present

## 2021-07-28 DIAGNOSIS — H52203 Unspecified astigmatism, bilateral: Secondary | ICD-10-CM | POA: Diagnosis not present

## 2021-07-28 DIAGNOSIS — Z20828 Contact with and (suspected) exposure to other viral communicable diseases: Secondary | ICD-10-CM | POA: Diagnosis not present

## 2021-07-28 DIAGNOSIS — H2513 Age-related nuclear cataract, bilateral: Secondary | ICD-10-CM | POA: Diagnosis not present

## 2021-07-28 DIAGNOSIS — H5213 Myopia, bilateral: Secondary | ICD-10-CM | POA: Diagnosis not present

## 2021-08-03 IMAGING — XA DG C-ARM 1-60 MIN
1 series · 1 of 1 positions shown · non-contrast
Comparison: MRI thoracic spine 08/24/2020.

CLINICAL DATA: Kyphoplasty level T8.

EXAM:
DG C-ARM 1-60 MIN; THORACIC SPINE 2 VIEWS
FLUOROSCOPY TIME:  Fluoroscopy Time:  1 minutes 59 seconds.

[Series 4: ortho standard · 1 of 1 slices shown]
[im 1/1]
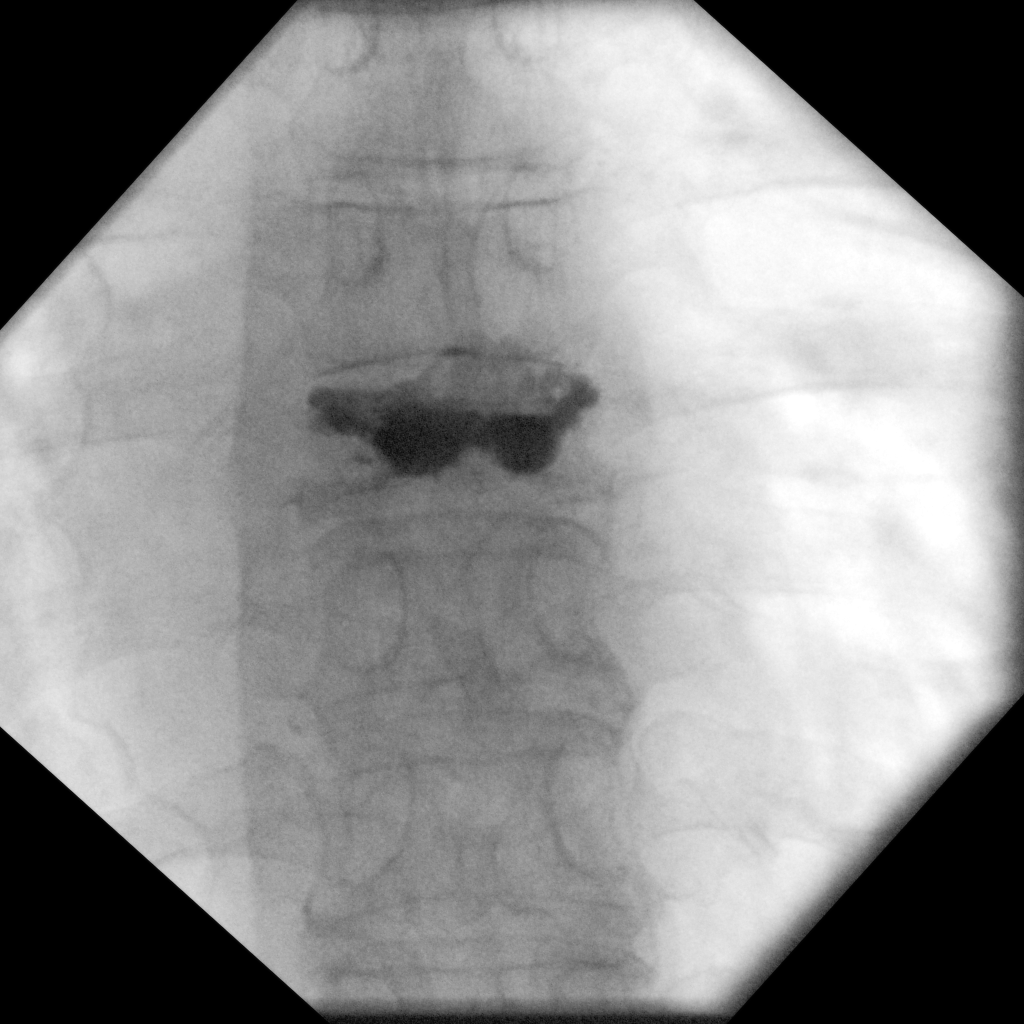

[1 of 1 positions shown; findings below may reference images not displayed]

FINDINGS: Two C-arm fluoroscopic images were obtained intraoperatively and
submitted for post operative interpretation. These images
demonstrate postsurgical changes of kyphoplasty, presumably at T8.
Please see the performing provider's procedural report for further
detail.
IMPRESSION: Intraoperative fluoroscopic images, as above.

## 2021-08-04 DIAGNOSIS — Z1159 Encounter for screening for other viral diseases: Secondary | ICD-10-CM | POA: Diagnosis not present

## 2021-08-04 DIAGNOSIS — Z20828 Contact with and (suspected) exposure to other viral communicable diseases: Secondary | ICD-10-CM | POA: Diagnosis not present

## 2021-08-11 DIAGNOSIS — Z1159 Encounter for screening for other viral diseases: Secondary | ICD-10-CM | POA: Diagnosis not present

## 2021-08-11 DIAGNOSIS — Z20828 Contact with and (suspected) exposure to other viral communicable diseases: Secondary | ICD-10-CM | POA: Diagnosis not present

## 2021-08-13 ENCOUNTER — Other Ambulatory Visit: Payer: Self-pay

## 2021-08-13 ENCOUNTER — Encounter: Payer: Self-pay | Admitting: Internal Medicine

## 2021-08-13 ENCOUNTER — Ambulatory Visit (INDEPENDENT_AMBULATORY_CARE_PROVIDER_SITE_OTHER): Payer: Medicare Other | Admitting: Podiatry

## 2021-08-13 DIAGNOSIS — Q828 Other specified congenital malformations of skin: Secondary | ICD-10-CM

## 2021-08-15 DIAGNOSIS — R509 Fever, unspecified: Secondary | ICD-10-CM | POA: Diagnosis not present

## 2021-08-15 DIAGNOSIS — R051 Acute cough: Secondary | ICD-10-CM | POA: Diagnosis not present

## 2021-08-15 DIAGNOSIS — R52 Pain, unspecified: Secondary | ICD-10-CM | POA: Diagnosis not present

## 2021-08-15 DIAGNOSIS — R0989 Other specified symptoms and signs involving the circulatory and respiratory systems: Secondary | ICD-10-CM | POA: Diagnosis not present

## 2021-08-15 DIAGNOSIS — U071 COVID-19: Secondary | ICD-10-CM | POA: Diagnosis not present

## 2021-08-15 DIAGNOSIS — R0981 Nasal congestion: Secondary | ICD-10-CM | POA: Diagnosis not present

## 2021-08-18 DIAGNOSIS — Z20828 Contact with and (suspected) exposure to other viral communicable diseases: Secondary | ICD-10-CM | POA: Diagnosis not present

## 2021-08-18 DIAGNOSIS — Z1159 Encounter for screening for other viral diseases: Secondary | ICD-10-CM | POA: Diagnosis not present

## 2021-08-19 NOTE — Progress Notes (Signed)
Subjective:  Patient ID: [name] [name], male    DOB: [birth year],  MRN: [id number]  Chief Complaint  Patient presents with   Callouses    Right foot     80 y.o. male presents with the above complaint.  Patient presents with complaint of right submetatarsal 5 skin lesion/benign skin lesion.  Patient states is painful to touch.  Patient would like to have it debrided down.  He has not seen anyone else prior to seeing me.  He would like to discuss treatment options for this.  He has been hurting to step on it.  Especially for his the right way.  It has been going for quite some time.  Pain scale is 5 out of 10 dull aching nature.   Review of Systems: Negative except as noted in the HPI. Denies N/V/F/Ch.  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[place])    Prostate cancer metastatic to bone (HCC)    REM sleep behavior disorder    Sleep apnea    BiPap    Current Outpatient Medications:    Acetaminophen (TYLENOL ARTHRITIS PAIN PO), Take 500 mg by mouth as needed., Disp: , Rfl:    apixaban (ELIQUIS) 5 MG TABS tablet, Take 1 tablet (5 mg total) by mouth 2 (two) times daily., Disp: 60 tablet, Rfl: 1   carbidopa-levodopa (SINEMET CR) 50-200 MG tablet, Take 1 tablet by mouth at bedtime., Disp: 60 tablet, Rfl: 3   carbidopa-levodopa (SINEMET IR) 25-100 MG tablet, 3 tablets at 7 AM/2 at 11 AM/2 tablets at 3 PM/2 tablet at 7 PM, Disp: 810 tablet, Rfl: 3   clonazePAM (KLONOPIN) 0.5 MG tablet, Take 1 tablet by mouth at bedtime., Disp: , Rfl:    diltiazem (CARDIZEM) 30 MG tablet, Take 1 tablet (30 mg total) by mouth 2 (two) times daily., Disp: 180 tablet, Rfl: 1   Glycerin-Hypromellose-PEG 400 (VISINE DRY EYE OP), Place 1 drop into both eyes 2 (two) times daily as needed (eye irritation).  , Disp: , Rfl:    ketoconazole (NIZORAL) 2 % shampoo, Apply 1 application topically once a week. , Disp: , Rfl:    leuprolide, 6 Month, (ELIGARD) 45 MG injection, Inject 45 mg into the skin every 6 (six) months., Disp: , Rfl:    rosuvastatin (CRESTOR) 10 MG tablet, Take 1 tablet (10 mg total) by mouth daily., Disp: 90 tablet, Rfl: 3   triamcinolone cream (KENALOG) 0.1 %, Apply 1 application topically 2 (two) times daily., Disp: 453.6 g, Rfl: 1  Social History   Tobacco Use  Smoking Status Former   Types: Cigars   Quit date: 09/07/2010   Years since quitting: 10.9  Smokeless Tobacco Never    Allergies  Allergen Reactions   Influenza Vaccines Anaphylaxis    Flu shot: 1974 anaphylaxis. 2nd time swollen arm   Objective:  There were no vitals filed for this visit. There is no height or weight on file to calculate BMI. Constitutional Well developed. Well nourished.  Vascular Dorsalis pedis pulses palpable bilaterally. Posterior tibial pulses palpable bilaterally. Capillary refill normal to all digits.  No cyanosis or clubbing noted. Pedal hair growth normal.  Neurologic Normal speech. Oriented to person, place, and time. Epicritic sensation to light touch grossly present bilaterally.  Dermatologic Hyperkeratotic  lesion with central nucleated core noted.  Pain on palpation to the core.  Plantarflexed right fifth metatarsal noted  Orthopedic: Normal joint ROM without pain or crepitus bilaterally. No visible deformities. No bony tenderness.   Radiographs: None Assessment:   1. Porokeratosis    Plan:  Patient was evaluated and treated and all questions answered.  Righ submetatarsal 5 porokeratosis -I explained to the patient the etiology of porokeratotic lesion versus treatment options were discussed.  Using chisel blade to handle the lesion was debrided down to healthy striated tissue no complication noted no pinpoint bleeding noted. -If there is no improvement we will discuss  punch excision versus metatarsal osteotomy.  No follow-ups on file.

## 2021-08-22 DIAGNOSIS — Z20828 Contact with and (suspected) exposure to other viral communicable diseases: Secondary | ICD-10-CM | POA: Diagnosis not present

## 2021-08-22 DIAGNOSIS — Z1159 Encounter for screening for other viral diseases: Secondary | ICD-10-CM | POA: Diagnosis not present

## 2021-08-25 ENCOUNTER — Other Ambulatory Visit: Payer: Self-pay

## 2021-08-25 MED ORDER — CARBIDOPA-LEVODOPA ER 50-200 MG PO TBCR: 1.0000 | EXTENDED_RELEASE_TABLET | Freq: Every day | ORAL | 0 refills | Status: DC

## 2021-09-08 DIAGNOSIS — Z20822 Contact with and (suspected) exposure to covid-19: Secondary | ICD-10-CM | POA: Diagnosis not present

## 2021-09-11 ENCOUNTER — Other Ambulatory Visit: Payer: Self-pay | Admitting: Internal Medicine

## 2021-09-11 DIAGNOSIS — K5901 Slow transit constipation: Secondary | ICD-10-CM | POA: Diagnosis not present

## 2021-09-11 DIAGNOSIS — C61 Malignant neoplasm of prostate: Secondary | ICD-10-CM | POA: Diagnosis not present

## 2021-09-11 DIAGNOSIS — R1030 Lower abdominal pain, unspecified: Secondary | ICD-10-CM | POA: Diagnosis not present

## 2021-09-11 DIAGNOSIS — K2900 Acute gastritis without bleeding: Secondary | ICD-10-CM | POA: Diagnosis not present

## 2021-09-15 DIAGNOSIS — Z20822 Contact with and (suspected) exposure to covid-19: Secondary | ICD-10-CM | POA: Diagnosis not present

## 2021-09-17 ENCOUNTER — Encounter: Payer: Self-pay | Admitting: Internal Medicine

## 2021-09-17 ENCOUNTER — Ambulatory Visit (INDEPENDENT_AMBULATORY_CARE_PROVIDER_SITE_OTHER): Payer: Medicare Other

## 2021-09-17 ENCOUNTER — Ambulatory Visit (INDEPENDENT_AMBULATORY_CARE_PROVIDER_SITE_OTHER): Payer: Medicare Other | Admitting: Podiatry

## 2021-09-17 ENCOUNTER — Other Ambulatory Visit: Payer: Self-pay

## 2021-09-17 DIAGNOSIS — Q828 Other specified congenital malformations of skin: Secondary | ICD-10-CM

## 2021-09-17 DIAGNOSIS — M216X1 Other acquired deformities of right foot: Secondary | ICD-10-CM | POA: Diagnosis not present

## 2021-09-17 DIAGNOSIS — Z01818 Encounter for other preprocedural examination: Secondary | ICD-10-CM | POA: Diagnosis not present

## 2021-09-22 DIAGNOSIS — Z20822 Contact with and (suspected) exposure to covid-19: Secondary | ICD-10-CM | POA: Diagnosis not present

## 2021-09-23 ENCOUNTER — Encounter: Payer: Self-pay | Admitting: Podiatry

## 2021-09-23 DIAGNOSIS — L821 Other seborrheic keratosis: Secondary | ICD-10-CM | POA: Diagnosis not present

## 2021-09-23 DIAGNOSIS — L814 Other melanin hyperpigmentation: Secondary | ICD-10-CM | POA: Diagnosis not present

## 2021-09-23 DIAGNOSIS — D229 Melanocytic nevi, unspecified: Secondary | ICD-10-CM | POA: Diagnosis not present

## 2021-09-23 DIAGNOSIS — L570 Actinic keratosis: Secondary | ICD-10-CM | POA: Diagnosis not present

## 2021-09-23 DIAGNOSIS — D1801 Hemangioma of skin and subcutaneous tissue: Secondary | ICD-10-CM | POA: Diagnosis not present

## 2021-09-23 NOTE — Progress Notes (Signed)
Subjective:  Patient ID: [name] [name], male    DOB: [birth year],  MRN: [id number]  Chief Complaint  Patient presents with   Callouses    Right foot     81 y.o. male presents with the above complaint.  Patient presents for follow-up of right plantarflexed fifth metatarsal with continuous pain underneath the fifth metatarsal head.  Patient states is painful to toe walk and touch.  He states it has not gotten any better.  He would like to discuss surgical options at this time.   Review of Systems: Negative except as noted in the HPI. Denies N/V/F/Ch.  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[place])    Prostate cancer metastatic to bone (HCC)    REM sleep behavior disorder    Sleep apnea    BiPap    Current Outpatient Medications:    Acetaminophen (TYLENOL ARTHRITIS PAIN PO), Take 500 mg by mouth as needed., Disp: , Rfl:    apixaban (ELIQUIS) 5 MG TABS tablet, Take 1 tablet (5 mg total) by mouth 2 (two) times daily., Disp: 60 tablet, Rfl: 1   carbidopa-levodopa (SINEMET CR) 50-200 MG tablet, Take 1 tablet by mouth at bedtime., Disp: 90 tablet, Rfl: 0   carbidopa-levodopa (SINEMET IR) 25-100 MG tablet, 3 tablets at 7 AM/2 at 11 AM/2 tablets at 3 PM/2 tablet at 7 PM, Disp: 810 tablet, Rfl: 3   clonazePAM (KLONOPIN) 0.5 MG tablet, Take 1 tablet by mouth at bedtime., Disp: , Rfl:    diltiazem (CARDIZEM) 30 MG tablet, Take 1 tablet (30 mg total) by mouth 2 (two) times daily., Disp: 180 tablet, Rfl: 1   Glycerin-Hypromellose-PEG 400 (VISINE DRY EYE OP), Place 1 drop into both eyes 2 (two) times daily as needed (eye irritation). , Disp: , Rfl:    ketoconazole (NIZORAL) 2 % shampoo, Apply 1 application topically once a week. , Disp: , Rfl:    leuprolide, 6 Month, (ELIGARD) 45 MG  injection, Inject 45 mg into the skin every 6 (six) months., Disp: , Rfl:    rosuvastatin (CRESTOR) 10 MG tablet, Take 1 tablet (10 mg total) by mouth daily., Disp: 90 tablet, Rfl: 3   triamcinolone cream (KENALOG) 0.1 %, Apply 1 application topically 2 (two) times daily., Disp: 453.6 g, Rfl: 1  Social History   Tobacco Use  Smoking Status Former   Types: Cigars   Quit date: 09/07/2010   Years since quitting: 11.0  Smokeless Tobacco Never    Allergies  Allergen Reactions   Influenza Vaccines Anaphylaxis    Flu shot: 1974 anaphylaxis. 2nd time swollen arm   Objective:  There were no vitals filed for this visit. There is no height or weight on file to calculate BMI. Constitutional Well developed. Well nourished.  Vascular Dorsalis pedis pulses palpable bilaterally. Posterior tibial pulses palpable bilaterally. Capillary refill normal to all digits.  No cyanosis or clubbing noted. Pedal hair growth normal.  Neurologic Normal speech. Oriented to person, place, and time. Epicritic sensation to light touch grossly present bilaterally.  Dermatologic Hyperkeratotic lesion with central nucleated core noted.  Pain on palpation to the core.  Plantarflexed right fifth metatarsal noted  Orthopedic: Normal joint ROM without pain or crepitus bilaterally. No visible deformities. No bony tenderness.   Radiographs:  None Assessment:   1. Porokeratosis   2. 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 Preoperative examination    Plan:  Patient was evaluated and treated and all questions answered.  Righ submetatarsal 5 porokeratosis with underlying plantarflexed metatarsal -I clinically patient has had no improvement with continued recurrence of the plantar hyperkeratotic lesion.  At this time I discussed with the patient that he will benefit from floating osteotomy/surgical intervention given that he has failed all conservative treatment options including debridement offloading shoe  gear modification.  Patient agrees with plan like to proceed with surgical intervention -I discussed my preoperative intra and postoperative plan in extensive detail he states understanding would like to proceed with surgery.  -Surgical shoe was dispensed -Informed surgical risk consent was reviewed and read aloud to the patient.  I reviewed the films.  I have discussed my findings with the patient in great detail.  I have discussed all risks including but not limited to infection, stiffness, scarring, limp, disability, deformity, damage to blood vessels and nerves, numbness, poor healing, need for braces, arthritis, chronic pain, amputation, death.  All benefits and realistic expectations discussed in great detail.  I have made no promises as to the outcome.  I have provided realistic expectations.  I have offered the patient a 2nd opinion, which they have declined and assured me they preferred to proceed despite the risks'   No follow-ups on file.

## 2021-09-26 ENCOUNTER — Encounter: Payer: Self-pay | Admitting: Internal Medicine

## 2021-10-02 ENCOUNTER — Ambulatory Visit
Admission: RE | Admit: 2021-10-02 | Discharge: 2021-10-02 | Disposition: A | Discharge status: Home / Self Care | Payer: Medicare Other | Source: Ambulatory Visit | Attending: Internal Medicine | Admitting: Internal Medicine

## 2021-10-02 ENCOUNTER — Other Ambulatory Visit: Payer: Self-pay

## 2021-10-02 DIAGNOSIS — R1084 Generalized abdominal pain: Secondary | ICD-10-CM | POA: Diagnosis not present

## 2021-10-02 DIAGNOSIS — R140 Abdominal distension (gaseous): Secondary | ICD-10-CM | POA: Diagnosis not present

## 2021-10-02 DIAGNOSIS — R1030 Lower abdominal pain, unspecified: Secondary | ICD-10-CM

## 2021-10-02 DIAGNOSIS — K5730 Diverticulosis of large intestine without perforation or abscess without bleeding: Secondary | ICD-10-CM | POA: Diagnosis not present

## 2021-10-02 MED ORDER — IOPAMIDOL (ISOVUE-300) INJECTION 61%
100.0000 mL | Freq: Once | INTRAVENOUS | Status: AC | PRN
  Administered 2021-10-02: 80 mL via INTRAVENOUS

## 2021-10-03 ENCOUNTER — Other Ambulatory Visit: Payer: Medicare Other

## 2021-10-03 ENCOUNTER — Other Ambulatory Visit: Payer: Self-pay

## 2021-10-03 DIAGNOSIS — C61 Malignant neoplasm of prostate: Secondary | ICD-10-CM

## 2021-10-04 LAB — PSA: Prostate Specific Ag, Serum: <0.1 ng/mL (ref 0.0–4.0)

## 2021-10-06 ENCOUNTER — Telehealth: Payer: Self-pay | Admitting: Urology

## 2021-10-06 DIAGNOSIS — Z20822 Contact with and (suspected) exposure to covid-19: Secondary | ICD-10-CM | POA: Diagnosis not present

## 2021-10-06 NOTE — Telephone Encounter (Signed)
PT called to cxl sx on 11/10/21 with Dr. Posey Pronto. PT stated that he wants another opinion from another doc before he reschedules the sx. I have informed Dr. Posey Pronto and Caren Griffins with [facility].

## 2021-10-07 ENCOUNTER — Other Ambulatory Visit: Payer: Self-pay | Admitting: *Deleted

## 2021-10-07 DIAGNOSIS — C7951 Secondary malignant neoplasm of bone: Secondary | ICD-10-CM

## 2021-10-07 DIAGNOSIS — C61 Malignant neoplasm of prostate: Secondary | ICD-10-CM

## 2021-10-08 ENCOUNTER — Ambulatory Visit: Payer: Self-pay | Admitting: Urology

## 2021-10-08 DIAGNOSIS — G4733 Obstructive sleep apnea (adult) (pediatric): Secondary | ICD-10-CM | POA: Diagnosis not present

## 2021-10-08 DIAGNOSIS — G4752 REM sleep behavior disorder: Secondary | ICD-10-CM | POA: Diagnosis not present

## 2021-10-08 DIAGNOSIS — F5101 Primary insomnia: Secondary | ICD-10-CM | POA: Diagnosis not present

## 2021-10-08 DIAGNOSIS — I4892 Unspecified atrial flutter: Secondary | ICD-10-CM | POA: Diagnosis not present

## 2021-10-08 DIAGNOSIS — G20 Parkinson's disease: Secondary | ICD-10-CM | POA: Diagnosis not present

## 2021-10-13 ENCOUNTER — Other Ambulatory Visit: Payer: Self-pay

## 2021-10-13 ENCOUNTER — Inpatient Hospital Stay: Payer: Medicare Other | Attending: Internal Medicine

## 2021-10-13 ENCOUNTER — Ambulatory Visit: Payer: Medicare Other | Admitting: Neurology

## 2021-10-13 DIAGNOSIS — C61 Malignant neoplasm of prostate: Secondary | ICD-10-CM | POA: Insufficient documentation

## 2021-10-13 DIAGNOSIS — K8689 Other specified diseases of pancreas: Secondary | ICD-10-CM | POA: Diagnosis not present

## 2021-10-13 DIAGNOSIS — R635 Abnormal weight gain: Secondary | ICD-10-CM | POA: Insufficient documentation

## 2021-10-13 DIAGNOSIS — Z905 Acquired absence of kidney: Secondary | ICD-10-CM | POA: Insufficient documentation

## 2021-10-13 DIAGNOSIS — Z79899 Other long term (current) drug therapy: Secondary | ICD-10-CM | POA: Diagnosis not present

## 2021-10-13 DIAGNOSIS — R160 Hepatomegaly, not elsewhere classified: Secondary | ICD-10-CM | POA: Diagnosis not present

## 2021-10-13 DIAGNOSIS — Z887 Allergy status to serum and vaccine status: Secondary | ICD-10-CM | POA: Insufficient documentation

## 2021-10-13 DIAGNOSIS — G20 Parkinson's disease: Secondary | ICD-10-CM | POA: Diagnosis not present

## 2021-10-13 DIAGNOSIS — C7951 Secondary malignant neoplasm of bone: Secondary | ICD-10-CM | POA: Diagnosis not present

## 2021-10-13 DIAGNOSIS — N183 Chronic kidney disease, stage 3 unspecified: Secondary | ICD-10-CM | POA: Diagnosis not present

## 2021-10-13 DIAGNOSIS — M549 Dorsalgia, unspecified: Secondary | ICD-10-CM | POA: Diagnosis not present

## 2021-10-13 DIAGNOSIS — Z9049 Acquired absence of other specified parts of digestive tract: Secondary | ICD-10-CM | POA: Insufficient documentation

## 2021-10-13 DIAGNOSIS — Z5111 Encounter for antineoplastic chemotherapy: Secondary | ICD-10-CM | POA: Diagnosis not present

## 2021-10-13 DIAGNOSIS — K5900 Constipation, unspecified: Secondary | ICD-10-CM | POA: Diagnosis not present

## 2021-10-13 DIAGNOSIS — Z803 Family history of malignant neoplasm of breast: Secondary | ICD-10-CM | POA: Diagnosis not present

## 2021-10-13 DIAGNOSIS — R3915 Urgency of urination: Secondary | ICD-10-CM | POA: Diagnosis not present

## 2021-10-13 DIAGNOSIS — Z833 Family history of diabetes mellitus: Secondary | ICD-10-CM | POA: Diagnosis not present

## 2021-10-13 DIAGNOSIS — Z7901 Long term (current) use of anticoagulants: Secondary | ICD-10-CM | POA: Insufficient documentation

## 2021-10-13 DIAGNOSIS — Z87891 Personal history of nicotine dependence: Secondary | ICD-10-CM | POA: Diagnosis not present

## 2021-10-13 DIAGNOSIS — R5383 Other fatigue: Secondary | ICD-10-CM | POA: Insufficient documentation

## 2021-10-13 DIAGNOSIS — K7689 Other specified diseases of liver: Secondary | ICD-10-CM | POA: Diagnosis not present

## 2021-10-13 DIAGNOSIS — Z8249 Family history of ischemic heart disease and other diseases of the circulatory system: Secondary | ICD-10-CM | POA: Diagnosis not present

## 2021-10-13 DIAGNOSIS — G8929 Other chronic pain: Secondary | ICD-10-CM | POA: Diagnosis not present

## 2021-10-13 LAB — CBC WITH DIFFERENTIAL/PLATELET
Abs Immature Granulocytes: 0.01 10*3/uL (ref 0.00–0.07)
Basophils Absolute: 0.1 10*3/uL (ref 0.0–0.1)
Basophils Relative: 1 %
Eosinophils Absolute: 0.2 10*3/uL (ref 0.0–0.5)
Eosinophils Relative: 3 %
HCT: 41.3 % (ref 39.0–52.0)
Hemoglobin: 13.2 g/dL (ref 13.0–17.0)
Immature Granulocytes: 0 %
Lymphocytes Relative: 32 %
Lymphs Abs: 1.7 10*3/uL (ref 0.7–4.0)
MCH: 30.2 pg (ref 26.0–34.0)
MCHC: 32 g/dL (ref 30.0–36.0)
MCV: 94.5 fL (ref 80.0–100.0)
Monocytes Absolute: 0.6 10*3/uL (ref 0.1–1.0)
Monocytes Relative: 12 %
Neutro Abs: 2.8 10*3/uL (ref 1.7–7.7)
Neutrophils Relative %: 52 %
Platelets: 197 10*3/uL (ref 150–400)
RBC: 4.37 MIL/uL (ref 4.22–5.81)
RDW: 13.5 % (ref 11.5–15.5)
WBC: 5.3 10*3/uL (ref 4.0–10.5)
nRBC: 0 % (ref 0.0–0.2)

## 2021-10-13 LAB — COMPREHENSIVE METABOLIC PANEL
ALT: 7 U/L (ref 0–44)
AST: 29 U/L (ref 15–41)
Albumin: 4 g/dL (ref 3.5–5.0)
Alkaline Phosphatase: 47 U/L (ref 38–126)
Anion gap: 9 (ref 5–15)
BUN: 24 mg/dL — ABNORMAL HIGH (ref 8–23)
CO2: 27 mmol/L (ref 22–32)
Calcium: 9.7 mg/dL (ref 8.9–10.3)
Chloride: 103 mmol/L (ref 98–111)
Creatinine, Ser: 1.27 mg/dL — ABNORMAL HIGH (ref 0.61–1.24)
GFR, Estimated: 57 mL/min — ABNORMAL LOW (ref >60)
Glucose, Bld: 91 mg/dL (ref 70–99)
Potassium: 4.3 mmol/L (ref 3.5–5.1)
Sodium: 139 mmol/L (ref 135–145)
Total Bilirubin: 1.3 mg/dL — ABNORMAL HIGH (ref 0.3–1.2)
Total Protein: 6.9 g/dL (ref 6.5–8.1)

## 2021-10-15 ENCOUNTER — Encounter: Payer: Self-pay | Admitting: Urology

## 2021-10-15 ENCOUNTER — Encounter: Payer: Self-pay | Admitting: Internal Medicine

## 2021-10-15 ENCOUNTER — Inpatient Hospital Stay: Payer: Medicare Other

## 2021-10-15 ENCOUNTER — Inpatient Hospital Stay (HOSPITAL_BASED_OUTPATIENT_CLINIC_OR_DEPARTMENT_OTHER): Payer: Medicare Other | Admitting: Internal Medicine

## 2021-10-15 ENCOUNTER — Ambulatory Visit (INDEPENDENT_AMBULATORY_CARE_PROVIDER_SITE_OTHER): Payer: Medicare Other | Admitting: Urology

## 2021-10-15 ENCOUNTER — Other Ambulatory Visit: Payer: Self-pay

## 2021-10-15 VITALS — BP 114/77 | HR 58 | Temp 98.0°F | Ht 64.0 in | Wt 153.2 lb

## 2021-10-15 VITALS — BP 116/73 | HR 53 | Ht 64.0 in | Wt 152.1 lb

## 2021-10-15 DIAGNOSIS — R351 Nocturia: Secondary | ICD-10-CM

## 2021-10-15 DIAGNOSIS — M8088XA Other osteoporosis with current pathological fracture, vertebra(e), initial encounter for fracture: Secondary | ICD-10-CM

## 2021-10-15 DIAGNOSIS — G20 Parkinson's disease: Secondary | ICD-10-CM | POA: Diagnosis not present

## 2021-10-15 DIAGNOSIS — G8929 Other chronic pain: Secondary | ICD-10-CM | POA: Diagnosis not present

## 2021-10-15 DIAGNOSIS — C61 Malignant neoplasm of prostate: Secondary | ICD-10-CM

## 2021-10-15 DIAGNOSIS — C7951 Secondary malignant neoplasm of bone: Secondary | ICD-10-CM | POA: Diagnosis not present

## 2021-10-15 DIAGNOSIS — N183 Chronic kidney disease, stage 3 unspecified: Secondary | ICD-10-CM | POA: Diagnosis not present

## 2021-10-15 DIAGNOSIS — Z5111 Encounter for antineoplastic chemotherapy: Secondary | ICD-10-CM | POA: Diagnosis not present

## 2021-10-15 LAB — PSA: Prostatic Specific Antigen: 0.06 ng/mL (ref 0.00–4.00)

## 2021-10-15 MED ORDER — ZOLEDRONIC ACID 5 MG/100ML IV SOLN
5.0000 mg | Freq: Once | INTRAVENOUS | Status: AC
  Administered 2021-10-15: 5 mg via INTRAVENOUS
  Filled 2021-10-15: qty 100

## 2021-10-15 MED ORDER — DESMOPRESSIN ACETATE 0.2 MG PO TABS: 0.2000 mg | ORAL_TABLET | Freq: Every day | ORAL | 3 refills | Status: DC

## 2021-10-15 MED ORDER — LEUPROLIDE ACETATE (6 MONTH) 45 MG ~~LOC~~ KIT
45.0000 mg | PACK | Freq: Once | SUBCUTANEOUS | Status: AC
  Administered 2021-10-15: 45 mg via SUBCUTANEOUS
  Filled 2021-10-15: qty 45

## 2021-10-15 MED ORDER — SODIUM CHLORIDE 0.9 % IV SOLN
Freq: Once | INTRAVENOUS | Status: AC
  Filled 2021-10-15: qty 250

## 2021-10-15 NOTE — Progress Notes (Signed)
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 Sclerotic 1.5 cm left ischial lesion/ Sclerotic medial left iliac bone 1.5 cm lesion (series 2/image 47), increased from 1.0 cm. Gleason score of 4+5= 9; with almost all cores involved greater than 80%.  9/16 Lupron 66-month depot on 9/16. [Urology; Dr.Siniski]  # MID OCT 2020- Zytiga 1000 mg+ prednisone; stopped December 2021 [poor tolerance if with RVR]; DISCONTINUED.   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       Prostate cancer metastatic to bone ([place])  05/24/2019 Initial Diagnosis   Prostate cancer metastatic to bone ([place])    HISTORY OF PRESENTING ILLNESS: Ambulating with a rolling walker;  Accompanied by adopted-daughter.  Daughter from [place] over the phone.  [name] [name] 81 y.o.  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   Patient had abdominal pain for which he had a CT scan that showed incidental cyst in the pancreas.   Patient has not had any further hospitalizations.  Continues to have chronic back pain from his compression fractures.  Continues to have limited mobility in his back fractures/also Parkinson's.  Gaining weight.  No falls.  Review of Systems  Constitutional:  Positive for malaise/fatigue. Negative for chills, diaphoresis, fever and weight loss.  HENT:  Negative for  nosebleeds and sore throat.   Eyes:  Negative for double vision.  Respiratory:  Negative for cough, hemoptysis, sputum production, shortness of breath and wheezing.   Cardiovascular:  Negative for chest pain, palpitations, orthopnea and leg swelling.  Gastrointestinal:  Positive for constipation. Negative for abdominal pain, blood in stool, diarrhea, heartburn, melena, nausea and vomiting.  Genitourinary:  Positive for frequency and urgency. Negative for dysuria.  Musculoskeletal:  Negative for back pain and joint pain.  Skin: Negative.  Negative for itching and rash.  Neurological:  Positive for tremors. Negative for dizziness, tingling, focal weakness, weakness and headaches.  Endo/Heme/Allergies:  Does not bruise/bleed easily.  Psychiatric/Behavioral:  Negative for depression. The patient is not nervous/anxious and does not have insomnia.    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   KYPHOPLASTY N/A 08/15/2020   Procedure: L2 compression fracture;  Surgeon: Hessie Knows, MD;  Location: [facility] ORS;  Service: Orthopedics;  Laterality: N/A;   KYPHOPLASTY N/A 08/29/2020   Procedure: T8 KYPHOPLASTY;  Surgeon: Hessie Knows, MD;  Location: [facility] ORS;  Service: Orthopedics;  Laterality: N/A;   KYPHOPLASTY N/A 11/12/2020   Procedure: L1 KYPHOPLASTY;  Surgeon: Hessie Knows, MD;  Location: [facility] ORS;  Service: Orthopedics;  Laterality: N/A;    SOCIAL HISTORY: Social History   Socioeconomic History   Marital  status: Widowed    Spouse name: [name] [name]   Number of children: 5   Years of education: Not on file   Highest education level: Not on file  Occupational History   Occupation: retired    Comment: Nutritional therapist  Tobacco Use   Smoking status: Former    Types: Cigars    Quit date: 09/07/2010    Years since quitting: 11.1   Smokeless tobacco: Never  Vaping Use   Vaping Use: Never used  Substance and Sexual Activity   Alcohol use: Not Currently    Comment: 1 can beer/day   Drug use: Never   Sexual activity: Not Currently  Other Topics Concern   Not on file  Social History Narrative   Lives in [place]; quit smoking in 2012; ocassional alcohol; Camera operator; wife; 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    Social Determinants of Health   Financial Resource Strain: Not on file  Food Insecurity: Not on file  Transportation Needs: Not on file  Physical Activity: Not on file  Stress: Not on file  Social Connections: Not on file  Intimate Partner Violence: Not on file    FAMILY HISTORY: Family History  Problem Relation Age of Onset   Heart disease Maternal Grandfather    Diabetes Paternal Grandfather    Breast cancer Daughter     ALLERGIES:  is allergic to influenza vaccines.  MEDICATIONS:  Current Outpatient Medications  Medication Sig Dispense Refill   acetaminophen (TYLENOL) 325 MG tablet 1 tablet as needed     apixaban (ELIQUIS) 5 MG TABS tablet Take 1 tablet (5 mg total) by mouth 2 (two) times daily. 60 tablet 1   carbidopa-levodopa (SINEMET IR) 25-100 MG tablet 3 tablets at 7 AM/2 at 11 AM/2 tablets at 3 PM/2 tablet at 7 PM 810 tablet 3   carbidopa-levodopa-entacapone (STALEVO) 50-200-200 MG tablet Take 1 tablet by mouth at bedtime.     clonazePAM (KLONOPIN) 0.5 MG tablet Take 1 tablet by mouth at bedtime.     desmopressin (DDAVP) 0.2 MG tablet Take 1 tablet (0.2 mg total) by mouth at bedtime. Take 1 hour prior to bedtime 30 tablet 3   diltiazem (CARDIZEM) 30 MG tablet  Take 1 tablet (30 mg total) by mouth 2 (two) times daily. 180 tablet 1   Glycerin-Hypromellose-PEG 400 (VISINE DRY EYE OP) Place 1 drop into both eyes 2 (two) times daily as needed (eye irritation).      ketoconazole (NIZORAL) 2 % shampoo Apply 1 application topically once a week.      leuprolide, 6 Month, (ELIGARD) 45 MG injection Inject 45 mg into the skin every 6 (six) months.     omeprazole (PRILOSEC) 20 MG capsule 1 capsule 30 minutes before morning meal     rosuvastatin (CRESTOR) 10 MG tablet Take 1 tablet (10 mg total) by mouth daily. 90 tablet 3   triamcinolone cream (KENALOG) 0.1 % Apply 1 application topically 2 (two) times daily. 453.6 g 1   zoledronic acid (RECLAST) 5 MG/100ML SOLN injection See admin instructions.     No current facility-administered medications for this visit.   Facility-Administered Medications Ordered in Other Visits  Medication Dose Route Frequency Provider Last Rate Last Admin   leuprolide (6 Month) (ELIGARD) injection 45 mg  45 mg Subcutaneous Once Charlaine Dalton R, MD       zoledronic acid (RECLAST) injection 5 mg  5 mg Intravenous Once Cammie Sickle, MD          .  PHYSICAL EXAMINATION: ECOG PERFORMANCE STATUS: 0 - Asymptomatic  Vitals:   10/15/21 1339  BP: 114/77  Pulse: (!) 58  Temp: 98 F (36.7 C)  SpO2: 99%   Filed Weights   10/15/21 1339  Weight: 153 lb 3.2 oz (69.5 kg)    Physical Exam Constitutional:      Comments: Frail-appearing Caucasian male patient.  Ambulating with a walker.  Alone.  HENT:     Head: Normocephalic and atraumatic.     Mouth/Throat:     Pharynx: No oropharyngeal exudate.  Eyes:     Pupils: Pupils are equal, round, and reactive to light.  Cardiovascular:     Rate and Rhythm: Normal rate and regular rhythm.  Pulmonary:     Effort: Pulmonary effort is normal. No respiratory distress.     Breath sounds: Normal breath sounds. No wheezing.  Abdominal:     General: Bowel sounds are normal. There  is no distension.     Palpations: Abdomen is soft. There is no mass.     Tenderness: There is no abdominal tenderness. There is no guarding or rebound.  Musculoskeletal:        General: No tenderness. Normal range of motion.     Cervical back: Normal range of motion and neck supple.  Skin:    General: Skin is warm.  Neurological:     Mental Status: He is alert and oriented to person, place, and time.     Comments: Tremor of right upper extremity.  Psychiatric:        Mood and Affect: Affect normal.     LABORATORY DATA:  I have reviewed the data as listed Lab Results  Component Value Date   WBC 5.3 10/13/2021   HGB 13.2 10/13/2021   HCT 41.3 10/13/2021   MCV 94.5 10/13/2021   PLT 197 10/13/2021   Recent Labs    03/12/21 1355 06/11/21 1411 10/13/21 1401  NA 140 138 139  K 4.4 4.0 4.3  CL 104 104 103  CO2 29 28 27   GLUCOSE 103* 106* 91  BUN 18 19 24*  CREATININE 1.23 1.19 1.27*  CALCIUM 9.6 9.3 9.7  GFRNONAA 60* >60 57*  PROT 6.9 7.0 6.9  ALBUMIN 3.9 4.1 4.0  AST 31 25 29   ALT 5 <5 7  ALKPHOS 54 50 47  BILITOT 1.3* 1.2 1.3*    RADIOGRAPHIC STUDIES: I have personally reviewed the radiological images as listed and agreed with the findings in the report. CT ABDOMEN PELVIS W CONTRAST  Result Date: 10/04/2021 CLINICAL DATA:  Mid abdominal pain with discomfort and bloating EXAM: CT ABDOMEN AND PELVIS WITH CONTRAST TECHNIQUE: Multidetector CT imaging of the abdomen and pelvis was performed using the standard protocol following bolus administration of intravenous contrast. RADIATION DOSE REDUCTION: This exam was performed according to the departmental dose-optimization program which includes automated exposure control, adjustment of the mA and/or kV according to patient size and/or use of iterative reconstruction technique. Creatinine was obtained on site at [name] at [address] Results: Creatinine 1.4 mg/dL. CONTRAST:  53mL ISOVUE-300 IOPAMIDOL  (ISOVUE-300) INJECTION 61% COMPARISON:  May 16, 2019 FINDINGS: Lower chest: RIGHT middle lobe clustered peripheral pulmonary nodules measuring up to 5 x 2 mm (series 5, image 11). Scattered bibasilar  atelectasis. Hepatobiliary: Revisualization of multiple hypodense masses of the liver, the majority of which are too small to accurately characterize. Cyst of the hepatic dome is similar in comparison to prior and spans approximately 13 mm. Gallbladder is decompressed. No intrahepatic biliary ductal dilation. Pancreas: There is new dilation of the pancreatic duct measuring approximately 7 mm in the pancreatic body. There is a new hypodense region in the head/uncinate process of the pancreas which measures approximately 19 by 13 mm (series 2, image 28). It measures low-density and may be cystic in etiology. Spleen: Spleen is unremarkable. Adrenals/Urinary Tract: Adrenal glands are unremarkable. Status post LEFT nephrectomy. Homogeneous enhancement of the RIGHT kidney. Subcentimeter hypodense masses too small to accurately characterize. No hydronephrosis. No obstructing nephrolithiasis. Bladder is unremarkable. /4 Stomach/Bowel: No evidence of bowel obstruction. Moderate rectal stool ball. Diverticulosis without evidence of acute diverticulitis. Appendix is surgically absent. Moderate colonic stool burden diffusely throughout the colon. Vascular/Lymphatic: Atherosclerotic calcifications of the aorta. No new suspicious lymphadenopathy identified. Reproductive: Prostate is present and heterogeneous in appearance. Other: No free air. Musculoskeletal: Status post kyphoplasty of L1 and L2. Sclerotic lesion of the LEFT iliac bone and ischium, similar in comparison to prior and consistent with known osseous metastatic disease. IMPRESSION: 1. There is new pancreatic ductal dilation with an indeterminate 19 mm hypodense low-density mass area in the head/uncinate process of the pancreas. Recommend further evaluation with  dedicated MRI/MRCP with contrast. 2. No evidence of bowel obstruction.  Moderate rectal stool ball. 3. Scattered peripherally located clustered pulmonary nodules in the RIGHT middle lobe. Findings are likely infectious or inflammatory in etiology. Given history of malignancy, recommend follow-up as per clinical protocol. Aortic Atherosclerosis (ICD10-I70.0). These results will be called to the ordering clinician or representative by the Radiologist Assistant, and communication documented in the PACS or [employer]. Electronically Signed   By: Valentino Saxon M.D.   On: 10/04/2021 13:19   DG Foot Complete Right  Result Date: 09/18/2021 Please see detailed radiograph report in office note.   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 On Eligard [urology/ q46m;last July 2022] JAN 2023- 0.06 stable. Continue Eligard every 6 months.  Proceed with Eligard today.  #Discussed with the patient and daughter-if patient has progressive disease/castrate resistant prostate cancer-recommend Xtandi or apalutamide [poor tolerance to Zytiga].  However continue Eligard for now.  #A. Fib-chronic- STABLE; .  Continue Eliquis.  # JAN 2023- incidental CT scan- [PCP]- a new pancreatic ductal dilation with an indeterminate 19 mm hypodense low-density mass area in the head/uncinate process of the pancreas. Awaiting on Feb 14th-  dedicated MRI/MRCP with contrast. Also awaiting GI referral.   #CKD stage III- GFR-57- continue hydration [esp with recent contrast]- STABLE.   # HOt flashes-grade 1-STABLE  #December 2021-bone density osteoporosis/T score -3; L4 compression fracture/back pain-s/p kyphoplasty [reclast q 12 month- Jan 2022]; pain management physician.  Again proceed with Reclast.   #Handicap parking: Given patient's ongoing back pain multiple fractures-reasonable.  I spoke  at length with the patient's daughters- regarding the patient's clinical status/plan of care.  Family agreement.    * reclast/repeat  in Jan 2024  # DISPOSITION: # Eligard; Reclast- # follow up in 6 months- MD;  Labs-1-2 day prior- cbc/cmp/PSA; -Dr.B    All questions were answered. The patient knows to call the clinic with any problems, questions or concerns.    Cammie Sickle, MD 10/15/2021 2:36 PM

## 2021-10-15 NOTE — Assessment & Plan Note (Addendum)
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 On Eligard [urology/ q32m;last July 2022] JAN 2023- 0.06 stable. Continue Eligard every 6 months.  Proceed with Eligard today.  #Discussed with the patient and daughter-if patient has progressive disease/castrate resistant prostate cancer-recommend Xtandi or apalutamide [poor tolerance to Zytiga].  However continue Eligard for now.  #A. Fib-chronic- STABLE; .  Continue Eliquis.  # JAN 2023- incidental CT scan- [PCP]- a new pancreatic ductal dilation with an indeterminate 19 mm hypodense low-density mass area in the head/uncinate process of the pancreas. Awaiting on Feb 14th-  dedicated MRI/MRCP with contrast. Also awaiting GI referral.   #CKD stage III- GFR-57- continue hydration [esp with recent contrast]- STABLE.   # HOt flashes-grade 1-STABLE  #December 2021-bone density osteoporosis/T score -3; L4 compression fracture/back pain-s/p kyphoplasty [reclast q 12 month- Jan 2022]; pain management physician.  Again proceed with Reclast.   #Handicap parking: Given patient's ongoing back pain multiple fractures-reasonable.  I spoke at length with the patient's daughters- regarding the patient's clinical status/plan of care.  Family agreement.    * reclast/repeat  in Jan 2024  # DISPOSITION: # Eligard; Reclast- # follow up in 6 months- MD;  Labs-1-2 day prior- cbc/cmp/PSA; -Dr.B

## 2021-10-15 NOTE — Progress Notes (Signed)
° °  10/15/2021 11:41 AM   [name] [name] 15-Aug-1941 [id number]  Reason for visit: prostate cancer, urinary symptoms/nocturia   HPI: I saw [name] [name] back in urology clinic today for follow-up of prostate cancer.  He is an 81 year old male with Parkinson's disease, solitary right kidney, and metastatic prostate cancer(diagnosed August 2020: 4+5=9, 12/12 cores, >80% involvement) to the left ischial tuberosity/ilium currently treated with ADT(urology-> transitioning to oncology providing ADT as of 10/2021).  He was previously on Zytiga and prednisone with oncology, but this was stopped secondary to A. fib/RVR.  He also has a history of bothersome urinary symptoms and incomplete bladder emptying, as well as a history of a PVP with an outside urologist a few years ago.  He reports that his urinary symptoms have significantly improved after being on ADT for over a year now, and he really denies any bothersome urinary symptoms today aside from ongoing nocturia 3-4 times at night.  PVRs have been normal.  He was previously on Flomax, and this was discontinued secondary to hypotension.  He is at a point where he is interested in trying additional strategies for the nocturia, as he has quite a bit of trouble falling back asleep.  He denies significant urinary problems during the day.  He is minimizing fluids before bedtime.  He has had an excellent PSA response to treatment, and PSA remains extremely low at 0.06 on 10/13/2021 which is stable from 0.07 seven months ago.  He will be transitioning his ADT treatments over to oncology based on convenience.  We do long conversation about his nocturia and urinary symptoms, and he is at the point where he is interested in trying something else.  We discussed the risks and benefits of desmopressin at length including hyponatremia and seizure, and he understands these risk and is willing to have close follow-up for repeat BMP in 1 week and 1 month.  Recent BMP from this  week normal sodium of 139.    -Continue ADT(now via oncology) -Trial of desmopressin 0.2 mg nightly, repeat BMP 1 week in 1 month -Virtual visit 6 weeks symptom check regarding nocturia -Yearly urology follow-up regarding urinary symptoms and prostate cancer    Billey Co, MD  [facility] [address] [place], [place] [address] [phone]

## 2021-10-15 NOTE — Progress Notes (Signed)
Pt states he has had a scan that was ordered by his PCP since last visit. Sine at [facility] imaging.  Pt would like for you to explain the medicine that will be used in his treatment.

## 2021-10-21 DIAGNOSIS — K862 Cyst of pancreas: Secondary | ICD-10-CM | POA: Diagnosis not present

## 2021-10-22 ENCOUNTER — Other Ambulatory Visit: Payer: Self-pay | Admitting: Surgery

## 2021-10-22 DIAGNOSIS — R5383 Other fatigue: Secondary | ICD-10-CM | POA: Diagnosis not present

## 2021-10-22 DIAGNOSIS — K869 Disease of pancreas, unspecified: Secondary | ICD-10-CM | POA: Diagnosis not present

## 2021-10-22 DIAGNOSIS — R140 Abdominal distension (gaseous): Secondary | ICD-10-CM | POA: Diagnosis not present

## 2021-10-22 DIAGNOSIS — K862 Cyst of pancreas: Secondary | ICD-10-CM

## 2021-10-22 DIAGNOSIS — C61 Malignant neoplasm of prostate: Secondary | ICD-10-CM | POA: Diagnosis not present

## 2021-10-22 DIAGNOSIS — L84 Corns and callosities: Secondary | ICD-10-CM | POA: Diagnosis not present

## 2021-10-28 ENCOUNTER — Other Ambulatory Visit: Payer: Self-pay

## 2021-10-28 ENCOUNTER — Ambulatory Visit
Admission: RE | Admit: 2021-10-28 | Discharge: 2021-10-28 | Disposition: A | Discharge status: Home / Self Care | Payer: Medicare Other | Source: Ambulatory Visit | Attending: Surgery | Admitting: Surgery

## 2021-10-28 ENCOUNTER — Encounter: Payer: Self-pay | Admitting: Neurology

## 2021-10-28 ENCOUNTER — Encounter: Payer: Self-pay | Admitting: Internal Medicine

## 2021-10-28 ENCOUNTER — Ambulatory Visit (INDEPENDENT_AMBULATORY_CARE_PROVIDER_SITE_OTHER): Payer: Medicare Other | Admitting: Neurology

## 2021-10-28 VITALS — BP 104/68 | HR 56 | Ht 66.0 in | Wt 150.8 lb

## 2021-10-28 DIAGNOSIS — G20A1 Parkinson's disease without dyskinesia, without mention of fluctuations: Secondary | ICD-10-CM

## 2021-10-28 DIAGNOSIS — R935 Abnormal findings on diagnostic imaging of other abdominal regions, including retroperitoneum: Secondary | ICD-10-CM | POA: Diagnosis not present

## 2021-10-28 DIAGNOSIS — K862 Cyst of pancreas: Secondary | ICD-10-CM | POA: Diagnosis not present

## 2021-10-28 DIAGNOSIS — G20 Parkinson's disease: Secondary | ICD-10-CM | POA: Diagnosis not present

## 2021-10-28 DIAGNOSIS — K7689 Other specified diseases of liver: Secondary | ICD-10-CM | POA: Diagnosis not present

## 2021-10-28 DIAGNOSIS — R932 Abnormal findings on diagnostic imaging of liver and biliary tract: Secondary | ICD-10-CM | POA: Diagnosis not present

## 2021-10-28 MED ORDER — GADOBENATE DIMEGLUMINE 529 MG/ML IV SOLN
14.0000 mL | Freq: Once | INTRAVENOUS | Status: AC | PRN
  Administered 2021-10-28: 14 mL via INTRAVENOUS

## 2021-10-28 NOTE — Progress Notes (Signed)
Assessment/Plan:   1.  Parkinsons Disease  -continue carbidopa/levodopa 25/100, 3 tablets at 7 AM, 2 tablets at 11 AM, 2 tablets at 3 PM, 2 tablets at 7 PM  -Continue carbidopa/levodopa 50/200 CR at bedtime  -refer to PT  2.  RBD  -pt seeing [facility] and they have increased klonopin, 0.5 mg, 2 po q hs.  I told him I worry about this dose in this age group.   3.  OSAS  -On BiPAP and followed by [facility] neurology.  4.  Atrial flutter  -On Eliquis  -On diltiazem  5.  Low blood pressure  -Due to combination of diltiazem and Parkinson's disease.  Following with cardiology in [place] now.  He is aware of blood pressure issues.  Pt asymptommatic currently  6.  Metastatic prostate cancer  -Following with oncology  7.  Compression fractures.  -Has had several, with kyphoplasty at the L1 level.  Following with pain management.  -he is in PT with [facility] now  8.  Pancreatic mass  -Following with [facility] surgery for further evaluation. Subjective:   [name] [name] was seen today in follow up for Parkinsons disease.  My previous records were reviewed prior to todays visit as well as outside records available to me. Pt denies falls.  Using walker most of the time when out but not in the apartment.  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  He is following with [facility] surgery.  He had an MRI of the abdomen this morning to further evaluate it.  Report is pending.    Current prescribed movement disorder medications: carbidopa/levodopa 25/100, 3 tablets at 7 AM/2 at 11 AM/2 at 3 PM/2 tablet at 7 PM carbidopa/levodopa 50/200 CR at bed  Clonazepam 0.5 mg at bedtime (he reports he is taking 2 at bed - he states that sleep PA is prescribing that now)     PREVIOUS MEDICATIONS: opicapone (took 2 weeks of samples and d/c)    ALLERGIES:   Allergies   Allergen Reactions   Influenza Vaccines Anaphylaxis    Flu shot: 1974 anaphylaxis. 2nd time swollen arm    CURRENT MEDICATIONS:  Outpatient Encounter Medications as of 10/28/2021  Medication Sig   acetaminophen (TYLENOL) 325 MG tablet 1 tablet as needed   apixaban (ELIQUIS) 5 MG TABS tablet Take 1 tablet (5 mg total) by mouth 2 (two) times daily.   carbidopa-levodopa (SINEMET IR) 25-100 MG tablet 3 tablets at 7 AM/2 at 11 AM/2 tablets at 3 PM/2 tablet at 7 PM   carbidopa-levodopa-entacapone (STALEVO) 50-200-200 MG tablet Take 1 tablet by mouth at bedtime.   clonazePAM (KLONOPIN) 0.5 MG tablet Take 1 tablet by mouth at bedtime.   desmopressin (DDAVP) 0.2 MG tablet Take 1 tablet (0.2 mg total) by mouth at bedtime. Take 1 hour prior to bedtime   diltiazem (CARDIZEM) 30 MG tablet Take 1 tablet (30 mg total) by mouth 2 (two) times daily.   Glycerin-Hypromellose-PEG 400 (VISINE DRY EYE OP) Place 1 drop into both eyes 2 (two) times daily as needed (eye irritation).    ketoconazole (NIZORAL) 2 % shampoo Apply 1 application topically once a week.    leuprolide, 6 Month, (ELIGARD) 45 MG injection Inject 45 mg into the skin every 6 (six) months.   omeprazole (PRILOSEC) 20 MG capsule 1 capsule 30 minutes before morning meal  rosuvastatin (CRESTOR) 10 MG tablet Take 1 tablet (10 mg total) by mouth daily.   triamcinolone cream (KENALOG) 0.1 % Apply 1 application topically 2 (two) times daily.   zoledronic acid (RECLAST) 5 MG/100ML SOLN injection See admin instructions.   No facility-administered encounter medications on file as of 10/28/2021.    Objective:   PHYSICAL EXAMINATION:    VITALS:   Vitals:   10/28/21 1257  BP: 104/68  Pulse: (!) 56  SpO2: 92%  Weight: 150 lb 12.8 oz (68.4 kg)  Height: 5\' 6"  (1.676 m)      GEN:  The patient appears stated age and is in NAD. HEENT:  Normocephalic, atraumatic.  The mucous membranes are moist. The superficial temporal arteries are without  ropiness or tenderness.   Neurological examination:  Orientation: The patient is alert and oriented x3. Cranial nerves: There is good facial symmetry with facial hypomimia. The speech is fluent and clear. Soft palate rises symmetrically and there is no tongue deviation. Hearing is intact to conversational tone. Sensation: Sensation is intact to light touch throughout Motor: Strength is at least antigravity x4.  Movement examination: Tone: There is nl tone in the UE/LE Abnormal movements: no dyskinesia today Coordination:  There is mild decremation with RAM's, with any form of RAMS, including alternating supination and pronation of the forearm, hand opening and closing, finger taps, heel taps and toe taps, Gait and Station: The patient arises pretty well without use of the hands.  He has start hesitation but once he gets going he does pretty well.  I have reviewed and interpreted the following labs independently    Chemistry      Component Value Date/Time   NA 139 10/13/2021 1401   K 4.3 10/13/2021 1401   CL 103 10/13/2021 1401   CO2 27 10/13/2021 1401   BUN 24 (H) 10/13/2021 1401   CREATININE 1.27 (H) 10/13/2021 1401      Component Value Date/Time   CALCIUM 9.7 10/13/2021 1401   ALKPHOS 47 10/13/2021 1401   AST 29 10/13/2021 1401   ALT 7 10/13/2021 1401   BILITOT 1.3 (H) 10/13/2021 1401       Lab Results  Component Value Date   WBC 5.3 10/13/2021   HGB 13.2 10/13/2021   HCT 41.3 10/13/2021   MCV 94.5 10/13/2021   PLT 197 10/13/2021    Lab Results  Component Value Date   TSH 1.217 08/16/2019    .  Cc:  [name], MD

## 2021-10-29 DIAGNOSIS — L570 Actinic keratosis: Secondary | ICD-10-CM | POA: Diagnosis not present

## 2021-10-29 DIAGNOSIS — D229 Melanocytic nevi, unspecified: Secondary | ICD-10-CM | POA: Diagnosis not present

## 2021-10-29 DIAGNOSIS — C44311 Basal cell carcinoma of skin of nose: Secondary | ICD-10-CM | POA: Diagnosis not present

## 2021-10-29 DIAGNOSIS — D485 Neoplasm of uncertain behavior of skin: Secondary | ICD-10-CM | POA: Diagnosis not present

## 2021-11-03 DIAGNOSIS — M21621 Bunionette of right foot: Secondary | ICD-10-CM | POA: Diagnosis not present

## 2021-11-03 DIAGNOSIS — Q828 Other specified congenital malformations of skin: Secondary | ICD-10-CM | POA: Diagnosis not present

## 2021-11-03 DIAGNOSIS — I739 Peripheral vascular disease, unspecified: Secondary | ICD-10-CM | POA: Diagnosis not present

## 2021-11-03 DIAGNOSIS — D2371 Other benign neoplasm of skin of right lower limb, including hip: Secondary | ICD-10-CM | POA: Diagnosis not present

## 2021-11-03 DIAGNOSIS — M792 Neuralgia and neuritis, unspecified: Secondary | ICD-10-CM | POA: Diagnosis not present

## 2021-11-05 ENCOUNTER — Other Ambulatory Visit: Payer: Self-pay

## 2021-11-05 DIAGNOSIS — D490 Neoplasm of unspecified behavior of digestive system: Secondary | ICD-10-CM | POA: Diagnosis not present

## 2021-11-05 NOTE — Progress Notes (Signed)
The proposed treatment discussed in conference is for discussion purpose only and is not a binding recommendation.  The patients have not been physically examined, or presented with their treatment options.  Therefore, final treatment plans cannot be decided.  

## 2021-11-11 ENCOUNTER — Telehealth: Payer: Self-pay

## 2021-11-11 ENCOUNTER — Other Ambulatory Visit: Payer: Self-pay

## 2021-11-11 DIAGNOSIS — K862 Cyst of pancreas: Secondary | ICD-10-CM

## 2021-11-11 DIAGNOSIS — R978 Other abnormal tumor markers: Secondary | ICD-10-CM

## 2021-11-11 NOTE — Telephone Encounter (Signed)
-----   Message from Milus Banister, MD sent at 11/11/2021  5:46 AM EST ----- ?Regarding: RE: EUS ?Got it, thanks. We'll get him in. ? 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  Thanks ? ?Wynetta Fines ? ? ?----- Message ----- ?From: Dwan Bolt, MD ?Sent: 11/10/2021   4:09 PM EST ?To: Milus Banister, MD ?Subject: EUS                                           ? 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 His CA19-9 is 500, so I am very suspicious this is malignant. I sent a referral to your office to consider an EUS. ?Thanks, ?Shelby ? ? ?

## 2021-11-11 NOTE — Telephone Encounter (Signed)
EUS scheduled with GM at [facility] on 12/25/21 at 915 am.  He has been added to the wait list the pt is on Eliquis  ? ?Left message on machine to call back  ?

## 2021-11-11 NOTE — Telephone Encounter (Signed)
EUS scheduled, pt instructed and medications reviewed.  Patient instructions mailed to home and sent to My Chart .  Patient to call with any questions or concerns.  

## 2021-11-13 ENCOUNTER — Other Ambulatory Visit: Payer: Self-pay

## 2021-11-13 ENCOUNTER — Other Ambulatory Visit: Payer: Medicare Other

## 2021-11-13 DIAGNOSIS — C61 Malignant neoplasm of prostate: Secondary | ICD-10-CM | POA: Diagnosis not present

## 2021-11-14 ENCOUNTER — Other Ambulatory Visit: Payer: Self-pay | Admitting: Cardiology

## 2021-11-14 DIAGNOSIS — I4892 Unspecified atrial flutter: Secondary | ICD-10-CM

## 2021-11-14 LAB — BASIC METABOLIC PANEL
BUN/Creatinine Ratio: 13 (ref 10–24)
BUN: 15 mg/dL (ref 8–27)
CO2: 24 mmol/L (ref 20–29)
Calcium: 9.5 mg/dL (ref 8.6–10.2)
Chloride: 102 mmol/L (ref 96–106)
Creatinine, Ser: 1.16 mg/dL (ref 0.76–1.27)
Glucose: 82 mg/dL (ref 70–99)
Potassium: 5.3 mmol/L — ABNORMAL HIGH (ref 3.5–5.2)
Sodium: 137 mmol/L (ref 134–144)
eGFR: 64 mL/min/{1.73_m2} (ref >59)

## 2021-11-17 DIAGNOSIS — G20 Parkinson's disease: Secondary | ICD-10-CM | POA: Diagnosis not present

## 2021-11-17 DIAGNOSIS — R2689 Other abnormalities of gait and mobility: Secondary | ICD-10-CM | POA: Diagnosis not present

## 2021-11-17 DIAGNOSIS — M5459 Other low back pain: Secondary | ICD-10-CM | POA: Diagnosis not present

## 2021-11-17 DIAGNOSIS — M6281 Muscle weakness (generalized): Secondary | ICD-10-CM | POA: Diagnosis not present

## 2021-11-19 ENCOUNTER — Encounter: Payer: Medicare Other | Admitting: Podiatry

## 2021-11-19 DIAGNOSIS — M5459 Other low back pain: Secondary | ICD-10-CM | POA: Diagnosis not present

## 2021-11-19 DIAGNOSIS — M6281 Muscle weakness (generalized): Secondary | ICD-10-CM | POA: Diagnosis not present

## 2021-11-19 DIAGNOSIS — G20 Parkinson's disease: Secondary | ICD-10-CM | POA: Diagnosis not present

## 2021-11-19 DIAGNOSIS — R2689 Other abnormalities of gait and mobility: Secondary | ICD-10-CM | POA: Diagnosis not present

## 2021-11-24 DIAGNOSIS — R2689 Other abnormalities of gait and mobility: Secondary | ICD-10-CM | POA: Diagnosis not present

## 2021-11-24 DIAGNOSIS — M5459 Other low back pain: Secondary | ICD-10-CM | POA: Diagnosis not present

## 2021-11-24 DIAGNOSIS — G20 Parkinson's disease: Secondary | ICD-10-CM | POA: Diagnosis not present

## 2021-11-24 DIAGNOSIS — M6281 Muscle weakness (generalized): Secondary | ICD-10-CM | POA: Diagnosis not present

## 2021-11-25 ENCOUNTER — Other Ambulatory Visit: Payer: Self-pay

## 2021-11-25 ENCOUNTER — Ambulatory Visit (INDEPENDENT_AMBULATORY_CARE_PROVIDER_SITE_OTHER): Payer: Medicare Other | Admitting: Urology

## 2021-11-25 DIAGNOSIS — C61 Malignant neoplasm of prostate: Secondary | ICD-10-CM

## 2021-11-25 DIAGNOSIS — R351 Nocturia: Secondary | ICD-10-CM

## 2021-11-25 NOTE — Progress Notes (Signed)
Virtual Visit via Telephone Note ? ?I connected with [name] [name] on 11/25/21 at  8:30 AM EDT by telephone and verified that I am speaking with the correct person using two identifiers. ?  ?Patient location: Home ?Provider location: [facility] ? ? ?I discussed the limitations, risks, security and privacy concerns of performing an evaluation and management service by telephone and the availability of in person appointments. We discussed the impact of the COVID-19 pandemic on the healthcare system, and the importance of social distancing and reducing patient and provider exposure. I also discussed with the patient that there may be a patient responsible charge related to this service. The patient expressed understanding and agreed to proceed. ? ?Reason for visit: Follow-up trial of desmopressin ? 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  He was previously on Zytiga and prednisone with oncology, but this was stopped secondary to A. fib/RVR.  He also has a history of bothersome urinary symptoms and incomplete bladder emptying, as well as a history of a PVP with an outside urologist a few years ago.  He reports that his urinary symptoms have significantly improved after being on ADT for over a year now, and he really denies any bothersome urinary symptoms today aside from ongoing nocturia 3-4 times at night.  PVRs have been normal.  He was previously on Flomax, and this was discontinued secondary to hypotension.   ? ?Primary complaint at our last visit was nocturia 3-5 times per night, with difficulty falling back asleep.  Minimal urinary symptoms during the day.  He is already minimizing fluids before bedtime.  Using shared decision making, he opted  for a trial of desmopressin.  He feels like this may have slightly improved his nocturia to 3-4 times per night, but not significant enough that he would like to continue the medication.  The primary issue is falling back asleep after voiding, and he is planning to talk to his PCP about potential sleep aids.  Follow-up sodium on desmopressin was normal. ? ?Follow Up: ?-Discontinue desmopressin ?-Behavioral strategies regarding nocturia discussed again ?-He will discuss sleep aids with PCP ?-RTC with urology yearly for urinary symptom check, continue ADT and treatment of metastatic prostate cancer through oncology ? ?I discussed the assessment and treatment plan with the patient. The patient was provided an opportunity to ask questions and all were answered. The patient agreed with the plan and demonstrated an understanding of the instructions. ?  ?The patient was advised to call back or seek an in-person evaluation if the symptoms worsen or if the condition fails to improve as anticipated. ? ?I provided 7 minutes of non-face-to-face time during this encounter. ? ? ?Billey Co, MD  ? ?

## 2021-11-26 DIAGNOSIS — M6281 Muscle weakness (generalized): Secondary | ICD-10-CM | POA: Diagnosis not present

## 2021-11-26 DIAGNOSIS — G20 Parkinson's disease: Secondary | ICD-10-CM | POA: Diagnosis not present

## 2021-11-26 DIAGNOSIS — M5459 Other low back pain: Secondary | ICD-10-CM | POA: Diagnosis not present

## 2021-11-26 DIAGNOSIS — R2689 Other abnormalities of gait and mobility: Secondary | ICD-10-CM | POA: Diagnosis not present

## 2021-11-28 ENCOUNTER — Other Ambulatory Visit: Payer: Self-pay | Admitting: Neurology

## 2021-11-28 DIAGNOSIS — R17 Unspecified jaundice: Secondary | ICD-10-CM | POA: Diagnosis not present

## 2021-11-28 DIAGNOSIS — K869 Disease of pancreas, unspecified: Secondary | ICD-10-CM | POA: Diagnosis not present

## 2021-11-28 DIAGNOSIS — K5901 Slow transit constipation: Secondary | ICD-10-CM | POA: Diagnosis not present

## 2021-12-01 ENCOUNTER — Telehealth: Payer: Self-pay

## 2021-12-01 DIAGNOSIS — G20 Parkinson's disease: Secondary | ICD-10-CM | POA: Diagnosis not present

## 2021-12-01 DIAGNOSIS — M6281 Muscle weakness (generalized): Secondary | ICD-10-CM | POA: Diagnosis not present

## 2021-12-01 DIAGNOSIS — R2689 Other abnormalities of gait and mobility: Secondary | ICD-10-CM | POA: Diagnosis not present

## 2021-12-01 DIAGNOSIS — M5459 Other low back pain: Secondary | ICD-10-CM | POA: Diagnosis not present

## 2021-12-01 NOTE — Telephone Encounter (Signed)
There are no sooner appts available.  I have him on a wait list and will move him up if we have any cancellations.   ?

## 2021-12-01 NOTE — Telephone Encounter (Signed)
-----   Message from Milus Banister, MD sent at 11/30/2021  4:20 PM EDT ----- ?Regarding: RE: EUS ?I am currently overbooked for this week, next week and them I am out of town for a week.   ? ?Apologies for the wait. ? ?Jamacia Jester if something opens up in my schedule before 4/20 please offer it to this man. ? ? ?thanks ?----- Message ----- ?From: Mansouraty, Telford Nab., MD ?Sent: 11/28/2021   5:05 PM EDT ?To: Milus Banister, MD, Dwan Bolt, MD, # ?Subject: RE: EUS                                       ? ?SA, ?Right now I do not see that I have any other earlier availability, but things can change.  I am not sure if DJ has availability either.  I think he has been on a wait list.  Chong Sicilian, can you see if DJ has any earlier availability than 4/20 and let us all know? ?Thanks. ?GM ?----- Message ----- ?From: Dwan Bolt, MD ?Sent: 11/28/2021   1:00 PM EDT ?To: Irving Copas., MD ?Subject: EUS                                           ? ?GM, ?I saw this patient a few weeks ago and referred him for an EUS. He has a pancreatic cyst with ductal dilation and a CA19-9 of 500, so I am very suspicious this is malignant. He is scheduled for an EUS with you on 4/20. I just wanted to see if it's possible to move him up if you have any cancellations before then. I know you are very busy so I understand if this isn't possible, but just wanted to check. ?Thanks, ?Shelby ? ? ? ?

## 2021-12-03 ENCOUNTER — Encounter: Payer: Medicare Other | Admitting: Podiatry

## 2021-12-03 DIAGNOSIS — C44311 Basal cell carcinoma of skin of nose: Secondary | ICD-10-CM | POA: Diagnosis not present

## 2021-12-03 DIAGNOSIS — D229 Melanocytic nevi, unspecified: Secondary | ICD-10-CM | POA: Diagnosis not present

## 2021-12-03 DIAGNOSIS — R2689 Other abnormalities of gait and mobility: Secondary | ICD-10-CM | POA: Diagnosis not present

## 2021-12-03 DIAGNOSIS — L570 Actinic keratosis: Secondary | ICD-10-CM | POA: Diagnosis not present

## 2021-12-03 DIAGNOSIS — M5459 Other low back pain: Secondary | ICD-10-CM | POA: Diagnosis not present

## 2021-12-03 DIAGNOSIS — M6281 Muscle weakness (generalized): Secondary | ICD-10-CM | POA: Diagnosis not present

## 2021-12-03 DIAGNOSIS — G20 Parkinson's disease: Secondary | ICD-10-CM | POA: Diagnosis not present

## 2021-12-04 ENCOUNTER — Other Ambulatory Visit: Payer: Self-pay

## 2021-12-04 DIAGNOSIS — E782 Mixed hyperlipidemia: Secondary | ICD-10-CM

## 2021-12-04 DIAGNOSIS — I700 Atherosclerosis of aorta: Secondary | ICD-10-CM | POA: Diagnosis not present

## 2021-12-04 DIAGNOSIS — Z7901 Long term (current) use of anticoagulants: Secondary | ICD-10-CM

## 2021-12-05 LAB — CMP14+EGFR
ALT: 8 IU/L (ref 0–44)
AST: 30 IU/L (ref 0–40)
Albumin/Globulin Ratio: 2.1 (ref 1.2–2.2)
Albumin: 4.6 g/dL (ref 3.7–4.7)
Alkaline Phosphatase: 59 IU/L (ref 44–121)
BUN/Creatinine Ratio: 15 (ref 10–24)
BUN: 20 mg/dL (ref 8–27)
Bilirubin Total: 0.9 mg/dL (ref 0.0–1.2)
CO2: 24 mmol/L (ref 20–29)
Calcium: 9.6 mg/dL (ref 8.6–10.2)
Chloride: 104 mmol/L (ref 96–106)
Creatinine, Ser: 1.33 mg/dL — ABNORMAL HIGH (ref 0.76–1.27)
Globulin, Total: 2.2 g/dL (ref 1.5–4.5)
Glucose: 102 mg/dL — ABNORMAL HIGH (ref 70–99)
Potassium: 5.1 mmol/L (ref 3.5–5.2)
Sodium: 139 mmol/L (ref 134–144)
Total Protein: 6.8 g/dL (ref 6.0–8.5)
eGFR: 54 mL/min/{1.73_m2} — ABNORMAL LOW (ref >59)

## 2021-12-05 LAB — LIPID PANEL WITH LDL/HDL RATIO
Cholesterol, Total: 124 mg/dL (ref 100–199)
HDL: 66 mg/dL (ref >39)
LDL Chol Calc (NIH): 34 mg/dL (ref 0–99)
LDL/HDL Ratio: 0.5 ratio (ref 0.0–3.6)
Triglycerides: 146 mg/dL (ref 0–149)
VLDL Cholesterol Cal: 24 mg/dL (ref 5–40)

## 2021-12-05 LAB — HEMOGLOBIN AND HEMATOCRIT, BLOOD
Hematocrit: 37.7 % (ref 37.5–51.0)
Hemoglobin: 13 g/dL (ref 13.0–17.7)

## 2021-12-05 LAB — LDL CHOLESTEROL, DIRECT: LDL Direct: 43 mg/dL (ref 0–99)

## 2021-12-08 ENCOUNTER — Other Ambulatory Visit: Payer: Self-pay

## 2021-12-08 DIAGNOSIS — R2689 Other abnormalities of gait and mobility: Secondary | ICD-10-CM | POA: Diagnosis not present

## 2021-12-08 DIAGNOSIS — G20 Parkinson's disease: Secondary | ICD-10-CM

## 2021-12-08 DIAGNOSIS — M5459 Other low back pain: Secondary | ICD-10-CM | POA: Diagnosis not present

## 2021-12-08 DIAGNOSIS — M6281 Muscle weakness (generalized): Secondary | ICD-10-CM | POA: Diagnosis not present

## 2021-12-08 MED ORDER — CARBIDOPA-LEVODOPA ER 50-200 MG PO TBCR: 1.0000 | EXTENDED_RELEASE_TABLET | Freq: Every day | ORAL | 0 refills | Status: DC

## 2021-12-08 NOTE — Telephone Encounter (Signed)
The pt has been advised that we do not have any sooner appts.  He would like to discuss some issues he is having.  I have advised him to call the referring (Dr Zenia Resides) and let them know he has concerns.  The pt has been advised of the information and verbalized understanding.    ?

## 2021-12-08 NOTE — Telephone Encounter (Signed)
Patient called states he is in bad shape and is seeking to get the EUS done sooner if possible. ?

## 2021-12-10 ENCOUNTER — Telehealth: Payer: Self-pay

## 2021-12-10 DIAGNOSIS — R2689 Other abnormalities of gait and mobility: Secondary | ICD-10-CM | POA: Diagnosis not present

## 2021-12-10 DIAGNOSIS — M5459 Other low back pain: Secondary | ICD-10-CM | POA: Diagnosis not present

## 2021-12-10 DIAGNOSIS — G20 Parkinson's disease: Secondary | ICD-10-CM | POA: Diagnosis not present

## 2021-12-10 DIAGNOSIS — M6281 Muscle weakness (generalized): Secondary | ICD-10-CM | POA: Diagnosis not present

## 2021-12-10 NOTE — Telephone Encounter (Signed)
-----   Message from Timothy Lasso, RN sent at 12/08/2021  8:22 AM EDT ----- ? ?----- Message ----- ?From: Timothy Lasso, RN ?Sent: 12/08/2021  12:00 AM EDT ?To: Timothy Lasso, RN ? ?Make sure to have Eliquis hold. 4/20 ? ? ?

## 2021-12-10 NOTE — Telephone Encounter (Signed)
Request to hold Eliquis has been sent to Dr Elayne Snare.  I have asked for ASAP response. 4/20 case  ?

## 2021-12-10 NOTE — Progress Notes (Signed)
Called pt to inform him about is lab results. Pt understood

## 2021-12-11 ENCOUNTER — Ambulatory Visit: Payer: Medicare Other | Admitting: Cardiology

## 2021-12-11 NOTE — Telephone Encounter (Signed)
Message received via chat asking what the pt is having done. (In regards to anti coag hold) I sent him a message back with a response (EUS) waiting for further information ?

## 2021-12-15 NOTE — Telephone Encounter (Signed)
Message received via chat feature was from Dr Terri Skains on 4/6.  I have not recived a response and sent a chat message to the Dr today asking for a response.  ?

## 2021-12-16 ENCOUNTER — Telehealth: Payer: Self-pay | Admitting: Gastroenterology

## 2021-12-16 NOTE — Telephone Encounter (Signed)
The pt has been advised that he will go to [facility] entrance A admissions.  No further questions at this time.  ?

## 2021-12-16 NOTE — Telephone Encounter (Signed)
Patient called to verify admissions details for 12/25/21. Per patient they told him to arrive at 7:45am but doesn't know where to go. Please advise.  ?

## 2021-12-16 NOTE — Telephone Encounter (Signed)
The pt has been advised that he can stop his Eliquis 2 days prior to his procedure.  The pt has been advised of the information and verbalized understanding.    ?

## 2021-12-17 DIAGNOSIS — R2689 Other abnormalities of gait and mobility: Secondary | ICD-10-CM | POA: Diagnosis not present

## 2021-12-17 DIAGNOSIS — M5459 Other low back pain: Secondary | ICD-10-CM | POA: Diagnosis not present

## 2021-12-17 DIAGNOSIS — M6281 Muscle weakness (generalized): Secondary | ICD-10-CM | POA: Diagnosis not present

## 2021-12-17 DIAGNOSIS — G20 Parkinson's disease: Secondary | ICD-10-CM | POA: Diagnosis not present

## 2021-12-18 ENCOUNTER — Encounter (HOSPITAL_COMMUNITY): Payer: Self-pay | Admitting: Gastroenterology

## 2021-12-19 DIAGNOSIS — M5459 Other low back pain: Secondary | ICD-10-CM | POA: Diagnosis not present

## 2021-12-19 DIAGNOSIS — R2689 Other abnormalities of gait and mobility: Secondary | ICD-10-CM | POA: Diagnosis not present

## 2021-12-19 DIAGNOSIS — G20 Parkinson's disease: Secondary | ICD-10-CM | POA: Diagnosis not present

## 2021-12-19 DIAGNOSIS — M6281 Muscle weakness (generalized): Secondary | ICD-10-CM | POA: Diagnosis not present

## 2021-12-22 DIAGNOSIS — M5459 Other low back pain: Secondary | ICD-10-CM | POA: Diagnosis not present

## 2021-12-22 DIAGNOSIS — R2689 Other abnormalities of gait and mobility: Secondary | ICD-10-CM | POA: Diagnosis not present

## 2021-12-22 DIAGNOSIS — M6281 Muscle weakness (generalized): Secondary | ICD-10-CM | POA: Diagnosis not present

## 2021-12-22 DIAGNOSIS — G20 Parkinson's disease: Secondary | ICD-10-CM | POA: Diagnosis not present

## 2021-12-23 ENCOUNTER — Ambulatory Visit: Payer: Medicare Other | Admitting: Cardiology

## 2021-12-23 ENCOUNTER — Encounter: Payer: Self-pay | Admitting: Cardiology

## 2021-12-23 VITALS — BP 104/69 | HR 61 | Temp 98.4°F | Resp 17 | Ht 66.0 in | Wt 152.4 lb

## 2021-12-23 DIAGNOSIS — C61 Malignant neoplasm of prostate: Secondary | ICD-10-CM | POA: Diagnosis not present

## 2021-12-23 DIAGNOSIS — I4510 Unspecified right bundle-branch block: Secondary | ICD-10-CM | POA: Diagnosis not present

## 2021-12-23 DIAGNOSIS — Z7901 Long term (current) use of anticoagulants: Secondary | ICD-10-CM

## 2021-12-23 DIAGNOSIS — G4733 Obstructive sleep apnea (adult) (pediatric): Secondary | ICD-10-CM

## 2021-12-23 DIAGNOSIS — E782 Mixed hyperlipidemia: Secondary | ICD-10-CM | POA: Diagnosis not present

## 2021-12-23 DIAGNOSIS — I700 Atherosclerosis of aorta: Secondary | ICD-10-CM | POA: Diagnosis not present

## 2021-12-23 DIAGNOSIS — I4892 Unspecified atrial flutter: Secondary | ICD-10-CM | POA: Diagnosis not present

## 2021-12-23 DIAGNOSIS — Z524 Kidney donor: Secondary | ICD-10-CM | POA: Diagnosis not present

## 2021-12-23 MED ORDER — DILTIAZEM HCL 30 MG PO TABS: 30.0000 mg | ORAL_TABLET | Freq: Two times a day (BID) | ORAL | 1 refills | Status: DC

## 2021-12-23 NOTE — Progress Notes (Signed)
? ?Date:  12/23/2021  ? ?ID:  [name] [name], DOB 10/30/1940, MRN [id number] ? ?PCP:  [name], MD  ?Cardiologist:  Rex Kras, DO, [facility] (established care 04/10/2021) ?Former Cardiologist: Isaias Cowman, MD ? ?Date: 12/23/21 ?Last Office Visit: 06/05/2021 ? ?Chief Complaint  ?Patient presents with  ? Follow-up  ?  36-monthfollow-up for atrial flutter management. ?Preprocedure  ? ? ?HPI  ?[name] MOGAis a 81y.o. male whose past medical history and cardiovascular risk factors include: kidney donor (left in 2016), former cigar smoker, hx of prostate cancer w/ mets to pelvis (per patient), OSA on BiPAP, atherosclerosis of aorta, Parkinson's disease, paroxysmal atrial flutter, advanced age. ? ?He is referred to the office at the request of VLeeroy Cha MD for evaluation of atrial flutter. ? ?Diagnosed with atrial flutter back in 2020 at an outside facility and has been on diltiazem and Eliquis for thromboembolic prophylaxis.  Since establishing care with our practice as of August 2022 patient remains stable from a cardiovascular standpoint.  He continues to be on AV nodal blocking agents and oral anticoagulation for thromboembolic prophylaxis.  He does not endorse evidence of bleeding.  Patient has held his Eliquis starting today for the upcoming EUS with gastroenterology later this week. ? ?Patient denies any chest pain to suggest angina pectoris or heart failure symptoms.   ? ?FUNCTIONAL STATUS: ?Walks about 1.5 miles per day and goes to physical therapy.   ? ?ALLERGIES: ?Allergies  ?Allergen Reactions  ? Influenza Vaccines Anaphylaxis  ?  Flu shot: 1974 anaphylaxis. 2nd time swollen arm  ? ? ?MEDICATION LIST PRIOR TO VISIT: ?Current Meds  ?Medication Sig  ? acetaminophen (TYLENOL) 325 MG tablet Take 325 mg by mouth every 6 (six) hours as needed for moderate pain.  ? carbidopa-levodopa (SINEMET CR) 50-200 MG tablet Take 1 tablet by mouth at bedtime.  ? carbidopa-levodopa (SINEMET  IR) 25-100 MG tablet 3 tablets at 7 AM/2 at 11 AM/2 tablets at 3 PM/2 tablet at 7 PM  ? clonazePAM (KLONOPIN) 0.5 MG tablet Take 1 tablet by mouth at bedtime.  ? Glycerin-Hypromellose-PEG 400 (VISINE DRY EYE OP) Place 1 drop into both eyes 2 (two) times daily as needed (eye irritation).   ? ketoconazole (NIZORAL) 2 % shampoo Apply 1 application topically once a week.   ? leuprolide, 6 Month, (ELIGARD) 45 MG injection Inject 45 mg into the skin every 6 (six) months.  ? omeprazole (PRILOSEC) 20 MG capsule 1 capsule 30 minutes before morning meal  ? rosuvastatin (CRESTOR) 10 MG tablet Take 1 tablet (10 mg total) by mouth daily.  ? triamcinolone cream (KENALOG) 0.1 % Apply 1 application topically 2 (two) times daily. (Patient taking differently: Apply 1 application. topically 2 (two) times daily as needed (irritation).)  ? zoledronic acid (RECLAST) 5 MG/100ML SOLN injection Inject 5 mg into the vein once.  ? [DISCONTINUED] diltiazem (CARDIZEM) 30 MG tablet TAKE 1 TABLET TWICE A DAY  ?  ? ?PAST MEDICAL HISTORY: ?Past Medical History:  ?Diagnosis Date  ? [place]   ? BPH (benign prostatic hyperplasia)   ? Cancer ([facility]   ? PROSTATE  ? Diverticulosis 2015  ? [place]'s disease [place]   ? 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 ([facility]   ? Prostate cancer metastatic to bone ([facility]   ? REM sleep behavior disorder   ? Sleep apnea   ? BiPap  ? ? ?PAST SURGICAL HISTORY: ?Past Surgical History:  ?Procedure Laterality Date  ?  APPENDECTOMY  1963  ? COLONOSCOPY  2010, 2015  ? 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 KYPHOPLASTY N/A 08/15/2020  ? Procedure: L2 compression fracture;  Surgeon: Hessie Knows, MD;  Location: [facility] ORS;  Service: Orthopedics;  Laterality: N/A;  ? KYPHOPLASTY N/A 08/29/2020  ? Procedure: T8 KYPHOPLASTY;  Surgeon: Hessie Knows, MD;  Location: [facility] ORS;  Service: Orthopedics;  Laterality: N/A;  ? KYPHOPLASTY N/A 11/12/2020  ? Procedure: L1 KYPHOPLASTY;  Surgeon: Hessie Knows, MD;   Location: [facility] ORS;  Service: Orthopedics;  Laterality: N/A;  ? ? ?FAMILY HISTORY: ?The patient family history includes Breast cancer in his daughter; Diabetes in his paternal grandfather; Heart disease in his maternal grandfather. ? ?SOCIAL HISTORY:  ?The patient  reports that he quit smoking about 11 years ago. His smoking use included cigars. He has never used smokeless tobacco. He reports that he does not currently use alcohol. He reports that he does not use drugs. ? ?REVIEW OF SYSTEMS: ?Review of Systems  ?Cardiovascular:  Negative for chest pain, dyspnea on exertion, leg swelling, near-syncope, orthopnea, palpitations, paroxysmal nocturnal dyspnea and syncope.  ?Respiratory:  Negative for cough and shortness of breath.   ?Gastrointestinal:  Positive for bloating.  ? ?PHYSICAL EXAM: ? ?  12/23/2021  ?  2:44 PM 10/28/2021  ? 12:57 PM 10/15/2021  ?  1:39 PM  ?Vitals with BMI  ?Height '5\' 6"'$  '5\' 6"'$  '5\' 4"'$   ?Weight 152 lbs 6 oz 150 lbs 13 oz 153 lbs 3 oz  ?BMI 24.61 24.35 26.28  ?Systolic [phone]  ?Diastolic 69 68 77  ?Pulse 61 56 58  ? ? ?CONSTITUTIONAL: Well-developed and well-nourished. No acute distress.  ?SKIN: Skin is warm and dry. No rash noted. No cyanosis. No pallor. No jaundice ?HEAD: Normocephalic and atraumatic.  ?EYES: No scleral icterus ?MOUTH/THROAT: Moist oral membranes.  ?NECK: No JVD present. No thyromegaly noted. No carotid bruits  ?LYMPHATIC: No visible cervical adenopathy.  ?CHEST Normal respiratory effort. No intercostal retractions  ?LUNGS: Clear to auscultation bilaterally. No stridor. No wheezes. No rales.  ?CARDIOVASCULAR: Regular rate and rhythm, positive P1-W2, soft holosystolic murmur heard at the apex, no rubs or gallops appreciated. ?ABDOMINAL: Soft, nontender, mildly distended, positive bowel sounds all 4 quadrants.  No apparent ascites.  ?EXTREMITIES: No peripheral edema, warm extremities, 2+ dorsalis pedis posterior tibial pulses, signs of chronic venous insufficiency. ?HEMATOLOGIC:  No significant bruising ?NEUROLOGIC: Oriented to person, place, and time. Nonfocal. Normal muscle tone.  ?PSYCHIATRIC: Normal mood and affect. Normal behavior. Cooperative ? ?CARDIAC DATABASE: ?EKG: ?04/11/2021: Sinus bradycardia, 52 bpm, RBBB, left axis, left anterior fascicular block, T wave inversions in the inferior leads cannot rule out ischemia.  ? ?Echocardiogram: ?04/30/2021: ?Normal LV systolic function with EF 63%. Moderate concentric hypertrophy of the left ventricle. Normal global wall motion. Left ventricle cavity is normal in size.  ?Doppler evidence of grade II (pseudonormal) diastolic dysfunction, elevated LAP.  ?Left atrial cavity is moderately dilated. ?Trileaflet aortic valve. Moderate (Grade II) aortic regurgitation. ?Mild to moderate mitral regurgitation. ?Mild tricuspid regurgitation.  ?No evidence of pulmonary hypertension. ?  ?Stress Testing: ?Lexiscan Tetrofosmin stress test 04/30/2021: ?1 Day Rest/Stress Protocol. ?Stress EKG is non-diagnostic for ischemia as its a pharmacologic stress test using Lexiscan. ?Normal myocardial perfusion without convincing evidence of reversible myocardial ischemia or prior infarct.   ?Left ventricular ejection fraction is 55% with normal wall motion.   ?Low risk study. ?No prior studies for comparison.  ? ?Heart Catheterization: ?None ? ?LABORATORY DATA: ? ?  Latest  Ref Rng & Units 12/04/2021  ?  1:59 PM 10/13/2021  ?  2:01 PM 06/11/2021  ?  2:11 PM  ?CBC  ?WBC 4.0 - 10.5 K/uL  5.3   6.0    ?Hemoglobin 13.0 - 17.7 g/dL 13.0   13.2   13.1    ?Hematocrit 37.5 - 51.0 % 37.7   41.3   40.8    ?Platelets 150 - 400 K/uL  197   175    ? ? ? ?  Latest Ref Rng & Units 12/04/2021  ?  1:59 PM 11/13/2021  ? 10:58 AM 10/13/2021  ?  2:01 PM  ?CMP  ?Glucose 70 - 99 mg/dL 102   82   91    ?BUN 8 - 27 mg/dL '20   15   24    '$ ?Creatinine 0.76 - 1.27 mg/dL 1.33   1.16   1.27    ?Sodium 134 - 144 mmol/L 139   137   139    ?Potassium 3.5 - 5.2 mmol/L 5.1   5.3   4.3    ?Chloride 96 - 106 mmol/L  104   102   103    ?CO2 20 - 29 mmol/L '24   24   27    '$ ?Calcium 8.6 - 10.2 mg/dL 9.6   9.5   9.7    ?Total Protein 6.0 - 8.5 g/dL 6.8    6.9    ?Total Bilirubin 0.0 - 1.2 mg/dL 0.9    1.3    ?Alkali

## 2021-12-24 ENCOUNTER — Ambulatory Visit: Payer: Medicare Other | Admitting: Gastroenterology

## 2021-12-24 DIAGNOSIS — M5459 Other low back pain: Secondary | ICD-10-CM | POA: Diagnosis not present

## 2021-12-24 DIAGNOSIS — R2689 Other abnormalities of gait and mobility: Secondary | ICD-10-CM | POA: Diagnosis not present

## 2021-12-24 DIAGNOSIS — G4752 REM sleep behavior disorder: Secondary | ICD-10-CM | POA: Diagnosis not present

## 2021-12-24 DIAGNOSIS — M6281 Muscle weakness (generalized): Secondary | ICD-10-CM | POA: Diagnosis not present

## 2021-12-24 DIAGNOSIS — G4733 Obstructive sleep apnea (adult) (pediatric): Secondary | ICD-10-CM | POA: Diagnosis not present

## 2021-12-24 DIAGNOSIS — G20 Parkinson's disease: Secondary | ICD-10-CM | POA: Diagnosis not present

## 2021-12-24 NOTE — Telephone Encounter (Signed)
Rex Kras, DO on 12/23/2021 ? ? ?  ?[facility] ?[address], [place] ?[place], [place]  [address] ?Phone:  [phone]   Fax:  [phone] ?  ?12/23/2021 ?  ?Re: [name] [name]     DOB: 1941-08-06           MRN: [id number] ?  ?Address: [address] [place] ?[place] [address] ?  ?  ?Dear [name], ?  ?[name] [name] was seen in the office today and is considered  at acceptable risk, from a cardiac standpoint, for his upcoming procedure: EUS.   ?  ?As discussed prior hold Eliquis 48 hours prior to the elective procedure.  Please restart Eliquis when medically safe/appropriate hemostasis is achieved.   ?  ?Please call Dept: [phone] with any additional questions. ?  ?  ?Sincerely,  ?  ?  ?Sunit [place], DO, FACC ?Pager: [phone] ?Office: [phone] ?   ? ?

## 2021-12-25 ENCOUNTER — Other Ambulatory Visit: Payer: Self-pay

## 2021-12-25 ENCOUNTER — Encounter (HOSPITAL_COMMUNITY): Payer: Self-pay | Admitting: Gastroenterology

## 2021-12-25 ENCOUNTER — Ambulatory Visit (HOSPITAL_COMMUNITY): Payer: Medicare Other | Admitting: Anesthesiology

## 2021-12-25 ENCOUNTER — Ambulatory Visit (HOSPITAL_BASED_OUTPATIENT_CLINIC_OR_DEPARTMENT_OTHER): Payer: Medicare Other | Admitting: Anesthesiology

## 2021-12-25 ENCOUNTER — Encounter (HOSPITAL_COMMUNITY)
Admission: RE | Disposition: A | Discharge status: Home / Self Care | Payer: Self-pay | Source: Home / Self Care | Attending: Gastroenterology

## 2021-12-25 ENCOUNTER — Ambulatory Visit (HOSPITAL_COMMUNITY)
Admission: RE | Admit: 2021-12-25 | Discharge: 2021-12-25 | Disposition: A | Discharge status: Home / Self Care | Payer: Medicare Other | Attending: Gastroenterology | Admitting: Gastroenterology

## 2021-12-25 DIAGNOSIS — C250 Malignant neoplasm of head of pancreas: Secondary | ICD-10-CM | POA: Diagnosis not present

## 2021-12-25 DIAGNOSIS — K2970 Gastritis, unspecified, without bleeding: Secondary | ICD-10-CM | POA: Insufficient documentation

## 2021-12-25 DIAGNOSIS — Z9989 Dependence on other enabling machines and devices: Secondary | ICD-10-CM | POA: Diagnosis not present

## 2021-12-25 DIAGNOSIS — K2289 Other specified disease of esophagus: Secondary | ICD-10-CM | POA: Diagnosis not present

## 2021-12-25 DIAGNOSIS — Z7901 Long term (current) use of anticoagulants: Secondary | ICD-10-CM | POA: Diagnosis not present

## 2021-12-25 DIAGNOSIS — Z87891 Personal history of nicotine dependence: Secondary | ICD-10-CM | POA: Insufficient documentation

## 2021-12-25 DIAGNOSIS — K449 Diaphragmatic hernia without obstruction or gangrene: Secondary | ICD-10-CM | POA: Diagnosis not present

## 2021-12-25 DIAGNOSIS — G4730 Sleep apnea, unspecified: Secondary | ICD-10-CM | POA: Diagnosis not present

## 2021-12-25 DIAGNOSIS — G4733 Obstructive sleep apnea (adult) (pediatric): Secondary | ICD-10-CM | POA: Diagnosis not present

## 2021-12-25 DIAGNOSIS — I4891 Unspecified atrial fibrillation: Secondary | ICD-10-CM | POA: Diagnosis not present

## 2021-12-25 DIAGNOSIS — C61 Malignant neoplasm of prostate: Secondary | ICD-10-CM | POA: Diagnosis not present

## 2021-12-25 DIAGNOSIS — R978 Other abnormal tumor markers: Secondary | ICD-10-CM | POA: Insufficient documentation

## 2021-12-25 DIAGNOSIS — K8689 Other specified diseases of pancreas: Secondary | ICD-10-CM

## 2021-12-25 DIAGNOSIS — K219 Gastro-esophageal reflux disease without esophagitis: Secondary | ICD-10-CM | POA: Insufficient documentation

## 2021-12-25 DIAGNOSIS — K862 Cyst of pancreas: Secondary | ICD-10-CM | POA: Diagnosis not present

## 2021-12-25 DIAGNOSIS — K319 Disease of stomach and duodenum, unspecified: Secondary | ICD-10-CM | POA: Diagnosis not present

## 2021-12-25 HISTORY — PX: BIOPSY: SHX5522

## 2021-12-25 HISTORY — PX: ESOPHAGOGASTRODUODENOSCOPY (EGD) WITH PROPOFOL: SHX5813

## 2021-12-25 HISTORY — PX: FINE NEEDLE ASPIRATION: SHX5430

## 2021-12-25 HISTORY — PX: EUS: SHX5427

## 2021-12-25 SURGERY — UPPER ENDOSCOPIC ULTRASOUND (EUS) RADIAL
Anesthesia: Monitor Anesthesia Care

## 2021-12-25 MED ORDER — LIDOCAINE 2% (20 MG/ML) 5 ML SYRINGE
INTRAMUSCULAR | Status: DC | PRN
  Administered 2021-12-25: 40 mg via INTRAVENOUS

## 2021-12-25 MED ORDER — APIXABAN 5 MG PO TABS: 5.0000 mg | ORAL_TABLET | Freq: Two times a day (BID) | ORAL | 1 refills | Status: DC

## 2021-12-25 MED ORDER — CIPROFLOXACIN IN D5W 400 MG/200ML IV SOLN
INTRAVENOUS | Status: AC
  Filled 2021-12-25: qty 200

## 2021-12-25 MED ORDER — LACTATED RINGERS IV SOLN: INTRAVENOUS | Status: DC

## 2021-12-25 MED ORDER — CIPROFLOXACIN HCL 500 MG PO TABS: 500.0000 mg | ORAL_TABLET | Freq: Two times a day (BID) | ORAL | 0 refills | Status: AC

## 2021-12-25 MED ORDER — SODIUM CHLORIDE 0.9 % IV SOLN: INTRAVENOUS | Status: DC

## 2021-12-25 MED ORDER — CIPROFLOXACIN IN D5W 400 MG/200ML IV SOLN
INTRAVENOUS | Status: DC | PRN
  Administered 2021-12-25: 400 mg via INTRAVENOUS

## 2021-12-25 MED ORDER — PROPOFOL 500 MG/50ML IV EMUL
INTRAVENOUS | Status: DC | PRN
  Administered 2021-12-25: 125 ug/kg/min via INTRAVENOUS

## 2021-12-25 MED ORDER — PROPOFOL 10 MG/ML IV BOLUS
INTRAVENOUS | Status: DC | PRN
  Administered 2021-12-25 (×2): 10 mg via INTRAVENOUS

## 2021-12-25 NOTE — Progress Notes (Signed)
Indep living ?

## 2021-12-25 NOTE — Anesthesia Procedure Notes (Signed)
Procedure Name: [place] ?Date/Time: 12/25/2021 10:08 AM ?Performed by: Janene Harvey, CRNA ?Pre-anesthesia Checklist: Patient identified, Emergency Drugs available, Suction available and Patient being monitored ?Patient Re-evaluated:Patient Re-evaluated prior to induction ?Oxygen Delivery Method: Nasal cannula ?Induction Type: IV induction ?Placement Confirmation: positive ETCO2 ?Dental Injury: Teeth and Oropharynx as per pre-operative assessment  ? ? ? ? ?

## 2021-12-25 NOTE — Op Note (Signed)
[facility] ?Patient Name: [name] [name] ?Procedure Date : 12/25/2021 ?MRN: [id number] ?Attending MD: Justice Britain , MD ?Date of Birth: 1941-02-15 ?CSN: [id number] ?Age: 81 ?Admit Type: Outpatient ?Procedure:                Upper EUS ?Indications:              Pancreatic cyst on MRCP, Dilated pancreatic duct on  ?                          MRCP, Elevated CA 19-9, Generalized abdominal pain ?Providers:                Justice Britain, MD, Jeanella Cara, RN,  ?                          Cletis [place], Technician, Reather Laurence, CRNA ?Referring MD:             Blanchard Mane. Margarita Sermons ?Medicines:                Monitored Anesthesia Care, Cipro 400 mg IV ?Complications:            No immediate complications. ?Estimated Blood Loss:     Estimated blood loss was minimal. ?Procedure:                Pre-Anesthesia Assessment: ?                          - Prior to the procedure, a History and Physical  ?                          was performed, and patient medications and  ?                          allergies were reviewed. The patient's tolerance of  ?                          previous anesthesia was also reviewed. The risks  ?                          and benefits of the procedure and the sedation  ?                          options and risks were discussed with the patient.  ?                          All questions were answered, and informed consent  ?                          was obtained. Prior Anticoagulants: The patient has  ?                          taken Eliquis (apixaban), last dose was 2 days  ?                          prior to procedure. ASA Grade Assessment: III - A  ?  patient with severe systemic disease. After  ?                          reviewing the risks and benefits, the patient was  ?                          deemed in satisfactory condition to undergo the  ?                          procedure. ?                          After  obtaining informed consent, the endoscope was  ?                          passed under direct vision. Throughout the  ?                          procedure, the patient's blood pressure, pulse, and  ?                          oxygen saturations were monitored continuously. The  ?                          [id number]-[id number] ([id number]) Olympus endoscope was introduced  ?                          through the mouth, and advanced to the second part  ?                          of duodenum. The [id number]-[id number] ([id number]) Olympus  ?                          duodenoscope was introduced through the mouth, and  ?                          advanced to the area of papilla. The [id number]  ?                          ([id number]) Olympus linear ultrasound scope was  ?                          introduced through the mouth, and advanced to the  ?                          duodenum for ultrasound examination from the  ?                          stomach and duodenum. The upper EUS was  ?                          accomplished without difficulty. The patient  ?  tolerated the procedure. ?Scope In: ?Scope Out: ?Findings: ?     ENDOSCOPIC FINDING: : ?     No gross lesions were noted in the entire esophagus. ?     The Z-line was irregular and was found 40 cm from the incisors. ?     A 4 cm hiatal hernia was present. ?     Segmental moderate inflammation characterized by erythema and  ?     granularity was found in the entire examined stomach. Biopsies were  ?     taken with a cold forceps for histology and Helicobacter pylori testing. ?     No gross lesions were noted in the duodenal bulb, in the first portion  ?     of the duodenum and in the second portion of the duodenum. ?     The major papilla was normal. ?     ENDOSONOGRAPHIC FINDING: : ?     An irregular mass-like region was identified in the pancreatic head  ?     right where the pancreatic duct has more significant dilation occuring.  ?     The mass was hypoechoic. The region  measured 28 mm by 20 mm in maximal  ?     cross-sectional diameter. The outer margins were irregular. There was  ?     sonographic evidence suggesting abutments of the portal vein. An intact  ?     interface was seen between the mass and the superior mesenteric artery  ?     and celiac trunk suggesting a lack of invasion. The remainder of the  ?     pancreas was examined. The endosonographic appearance of parenchyma and  ?     the upstream pancreatic duct indicated duct dilation, prominent ductal  ?     side-branches, a tortuous/ectatic duct and parenchymal atrophy with  ?     hyperechoic strands and hyperechoic foci with lobularity also present.  ?     There was evidence of multiple cysts within the NOP and the HOP as well.  ?     Fine needle biopsy was performed. Color Doppler imaging was utilized  ?     prior to needle puncture to confirm a lack of significant vascular  ?     structures within the needle path. Eight passes were made with the  ?     Acquire 22 gauge ultrasound core biopsy needle using a transduodenal  ?     approach. Visible cores of tissue were obtained. Preliminary cytologic  ?     examination and touch preps were performed. Final cytology results are  ?     pending. ?     Endosonographic imaging in the visualized portion of the liver showed no  ?     mass. ?     No malignant-appearing lymph nodes were visualized in the celiac region  ?     (level 20), peripancreatic region and porta hepatis region. ?     The celiac region was visualized. ?Impression:               EGD Impression: ?                          - No gross lesions in esophagus. Z-line irregular,  ?                          40 cm  from the incisors. ?                          - 4 cm hiatal hernia. ?                          - Gastritis. Biopsied. ?                          - No gross lesions in the duodenal bulb, in the  ?                          first portion of the duodenum and in the second  ?                          portion of  the duodenum. ?                          - Normal major papilla. ?                          EUS Impression: ?                          - A mass-like region was identified in the  ?                          pancreatic head where the pancreatic duct dilates  ?                          with multiple cystic regions noted throughout the  ?                          pancreas as well. The pancreas itself has evidence  ?                          of chronic pancreatitis changes as well. Cytology  ?                          results are pending. However, the endosonographic  ?                          appearance is suspicious for potential  ?                          adenocarcinoma. This was staged T2 N0 Mx by  ?                          endosonographic criteria. The staging applies if  ?                          malignancy is confirmed. Fine needle biopsy  ?                          performed. ?                          -  No malignant-appearing lymph nodes were  ?                          visualized in the celiac region (level 20),  ?                          peripancreatic region and porta hepatis region. ?Recommendation:           - The patient will be observed post-procedure,  ?                          until all discharge criteria are met. ?                          - Discharge patient to home. ?                          - Patient has a contact number available for  ?                          emergencies. The signs and symptoms of potential  ?                          delayed complications were discussed with the  ?                          patient. Return to normal activities tomorrow.  ?                          Written discharge instructions were provided to the  ?                          patient. ?                          - Observe patient's clinical course. ?                          - Monitor for signs/symptoms of bleeding,  ?                          perforation, and infection. If issues please call  ?                           our number to get further assistance as needed. ?                          - Ciprofloxacin 500 mg twice daily for 3-days to  ?                          decrease post-interventional infectious  ?

## 2021-12-25 NOTE — H&P (Signed)
? ?GASTROENTEROLOGY PROCEDURE H&P NOTE  ? ?Primary Care Physician: ?[name], MD ? ?HPI: ?[name] [name] is a 81 y.o. male who presents for EGD/EUS to evaluate pancreatic cyst, pancreatic ductal dilation, elevated CA19-9. ? ?Past Medical History:  ?Diagnosis Date  ? Atrial flutter ([place])   ? BPH (benign prostatic hyperplasia)   ? Cancer [facility])   ? PROSTATE  ? Diverticulosis 2015  ? Parkinson's disease ([place])   ? 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)   ? REM sleep behavior disorder   ? Sleep apnea   ? BiPap  ? ?Past Surgical History:  ?Procedure Laterality Date  ? APPENDECTOMY  1963  ? COLONOSCOPY  2010, 2015  ? 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 KYPHOPLASTY N/A 08/15/2020  ? Procedure: L2 compression fracture;  Surgeon: Hessie Knows, MD;  Location: [facility] ORS;  Service: Orthopedics;  Laterality: N/A;  ? KYPHOPLASTY N/A 08/29/2020  ? Procedure: T8 KYPHOPLASTY;  Surgeon: Hessie Knows, MD;  Location: [facility] ORS;  Service: Orthopedics;  Laterality: N/A;  ? KYPHOPLASTY N/A 11/12/2020  ? Procedure: L1 KYPHOPLASTY;  Surgeon: Hessie Knows, MD;  Location: [facility] ORS;  Service: Orthopedics;  Laterality: N/A;  ? ?No current facility-administered medications for this encounter.  ? ?No current facility-administered medications for this encounter. ?Allergies  ?Allergen Reactions  ? Influenza Vaccines Anaphylaxis  ?  Flu shot: 1974 anaphylaxis. 2nd time swollen arm  ? ?Family History  ?Problem Relation Age of Onset  ? Heart disease Maternal Grandfather   ? Diabetes Paternal Grandfather   ? Breast cancer Daughter   ? ?Social History  ? ?Socioeconomic History  ? Marital status: Widowed  ?  Spouse name: Dwyane Luo  ? Number of children: 5  ? Years of education: Not on file  ? Highest education level: Not on file  ?Occupational History  ? Occupation: retired  ?  Comment: Nutritional therapist  ?Tobacco Use  ? Smoking status: Former  ?  Types: Cigars   ?  Quit date: 09/07/2010  ?  Years since quitting: 11.3  ? Smokeless tobacco: Never  ?Vaping Use  ? Vaping Use: Never used  ?Substance and Sexual Activity  ? Alcohol use: Not Currently  ?  Comment: 1 can beer/day  ? Drug use: Never  ? Sexual activity: Not Currently  ?Other Topics Concern  ? Not on file  ?Social History Narrative  ? Lives in [place]; quit smoking in 2012; ocassional alcohol; Camera operator; wife; 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   ? ?Social Determinants of Health  ? ?Financial Resource Strain: Not on file  ?Food Insecurity: Not on file  ?Transportation Needs: Not on file  ?Physical Activity: Not on file  ?Stress: Not on file  ?Social Connections: Not on file  ?Intimate Partner Violence: Not on file  ? ? ?Physical Exam: ?There were no vitals filed for this visit. ?There is no height or weight on file to calculate BMI. ?GEN: NAD ?EYE: Sclerae anicteric ?ENT: MMM ?CV: Non-tachycardic ?GI: Soft, NT/ND ?NEURO:  Alert & Oriented x 3 ? ?Lab Results: ?No results for input(s): WBC, HGB, HCT, PLT in the last 72 hours. ?BMET ?No results for input(s): NA, K, CL, CO2, GLUCOSE, BUN, CREATININE, CALCIUM in the last 72 hours. ?LFT ?No results for input(s): PROT, ALBUMIN, AST, ALT, ALKPHOS, BILITOT, BILIDIR, IBILI in the last 72 hours. ?PT/INR ?No results for input(s): LABPROT, INR in the last 72 hours. ? ? ?Impression / Plan: ?This is  a 81 y.o.male who presents for EGD/EUS to evaluate pancreatic cyst, pancreatic ductal dilation, elevated CA19-9. ? ?The risks of an EUS including intestinal perforation, bleeding, infection, aspiration, and medication effects were discussed as was the possibility it may not give a definitive diagnosis if a biopsy is performed.  When a biopsy of the pancreas is done as part of the EUS, there is an additional risk of pancreatitis at the rate of about 1-2%.  It was explained that procedure related pancreatitis is typically mild, although it can be severe and even life threatening, which is why  we do not perform random pancreatic biopsies and only biopsy a lesion/area we feel is concerning enough to warrant the risk. ? ? ?The risks and benefits of endoscopic evaluation/treatment were discussed with the patient and/or family; these include but are not limited to the risk of perforation, infection, bleeding, missed lesions, lack of diagnosis, severe illness requiring hospitalization, as well as anesthesia and sedation related illnesses.  The patient's history has been reviewed, patient examined, no change in status, and deemed stable for procedure.  The patient and/or family is agreeable to proceed.  ? ? ?Justice Britain, MD ?[facility] ?Advanced Endoscopy ?Office # [phone] ? ?

## 2021-12-25 NOTE — Transfer of Care (Signed)
Immediate Anesthesia Transfer of Care Note ? ?Patient: [name] [name] ? ?Procedure(s) Performed: UPPER ENDOSCOPIC ULTRASOUND (EUS) RADIAL ?BIOPSY ?FINE NEEDLE ASPIRATION (FNA) LINEAR ? ?Patient Location: PACU ? ?Anesthesia Type:MAC ? ?Level of Consciousness: drowsy and patient cooperative ? ?Airway & Oxygen Therapy: Patient Spontanous Breathing and Patient connected to nasal cannula oxygen ? ?Post-op Assessment: Report given to RN and Post -op Vital signs reviewed and stable ? ?Post vital signs: Reviewed and stable ? ?Last Vitals:  ?Vitals Value Taken Time  ?BP 129/80 12/25/21 1114  ?Temp    ?Pulse 48 12/25/21 1115  ?Resp 20 12/25/21 1115  ?SpO2 100 % 12/25/21 1115  ?Vitals shown include unvalidated device data. ? ?Last Pain:  ?Vitals:  ? 12/25/21 0751  ?TempSrc: Temporal  ?PainSc: 0-No pain  ?   ? ?  ? ?Complications: No notable events documented. ?

## 2021-12-25 NOTE — Anesthesia Preprocedure Evaluation (Addendum)
Anesthesia Evaluation  ?Patient identified by MRN, date of birth, ID band ?Patient awake ? ? ? ?Reviewed: ?Allergy & Precautions, NPO status , Patient's Chart, lab work & pertinent test results ? ?History of Anesthesia Complications ?Negative for: history of anesthetic complications ? ?Airway ?Mallampati: II ? ?TM Distance: >3 FB ?Neck ROM: Full ? ? ? Dental ? ?(+) Dental Advisory Given, Teeth Intact ?  ?Pulmonary ?sleep apnea and Continuous Positive Airway Pressure Ventilation , former smoker,  ?  ?Pulmonary exam normal ? ? ? ? ? ? ? Cardiovascular ?Normal cardiovascular exam+ dysrhythmias Atrial Fibrillation  ? ? ?'22 TTE - EF 63%. Moderate concentric hypertrophy  ?of the left ventricle. Grade II (pseudonormal) diastolic  ?dysfunction, elevated LAP. Left atrial cavity is moderately dilated. Moderate (Grade II) aortic regurgitation. Mild to moderate mitral regurgitation. Mild tricuspid regurgitation.  ? ?  ?Neuro/Psych ? ?Parkinson's disease ? ?negative psych ROS  ? GI/Hepatic ?Neg liver ROS, GERD  Medicated and Controlled,  ?Endo/Other  ?negative endocrine ROS ? Renal/GU ? ?S/p kidney donation ?  ? ? ?Prostate cancer with mets to bone ? ? ?  ?Musculoskeletal ?negative musculoskeletal ROS ?(+)  ? Abdominal ?  ?Peds ? Hematology ? ?On eliquis ?   ?Anesthesia Other Findings ? ? Reproductive/Obstetrics ? ?  ? ? ? ? ? ? ? ? ? ? ? ? ? ?  ?  ? ? ? ? ? ? ? ?Anesthesia Physical ?Anesthesia Plan ? ?ASA: 3 ? ?Anesthesia Plan: MAC  ? ?Post-op Pain Management: Minimal or no pain anticipated  ? ?Induction:  ? ?PONV Risk Score and Plan: 1 and Propofol infusion and Treatment may vary due to age or medical condition ? ?Airway Management Planned: Nasal Cannula and Natural Airway ? ?Additional Equipment: None ? ?Intra-op Plan:  ? ?Post-operative Plan:  ? ?Informed Consent: I have reviewed the patients History and Physical, chart, labs and discussed the procedure including the risks, benefits and  alternatives for the proposed anesthesia with the patient or authorized representative who has indicated his/her understanding and acceptance.  ? ? ? ? ? ?Plan Discussed with: CRNA and Anesthesiologist ? ?Anesthesia Plan Comments:   ? ? ? ? ? ?Anesthesia Quick Evaluation ? ?

## 2021-12-25 NOTE — Anesthesia Postprocedure Evaluation (Signed)
Anesthesia Post Note ? ?Patient: BLADIMIR AUMAN ? ?Procedure(s) Performed: UPPER ENDOSCOPIC ULTRASOUND (EUS) RADIAL ?BIOPSY ?FINE NEEDLE ASPIRATION (FNA) LINEAR ? ?  ? ?Patient location during evaluation: PACU ?Anesthesia Type: MAC ?Level of consciousness: awake and alert ?Pain management: pain level controlled ?Vital Signs Assessment: post-procedure vital signs reviewed and stable ?Respiratory status: spontaneous breathing, nonlabored ventilation and respiratory function stable ?Cardiovascular status: stable and blood pressure returned to baseline ?Anesthetic complications: no ? ? ?No notable events documented. ? ?Last Vitals:  ?Vitals:  ? 12/25/21 1130 12/25/21 1145  ?BP: (!) 138/97 (!) 142/96  ?Pulse: (!) 53 63  ?Resp: 18 20  ?Temp:  36.7 ?C  ?SpO2: 100% 100%  ?  ?Last Pain:  ?Vitals:  ? 12/25/21 1145  ?TempSrc:   ?PainSc: 0-No pain  ? ? ?  ?  ?  ?  ?  ?  ? ?Audry Pili ? ? ? ? ?

## 2021-12-26 LAB — CYTOLOGY - NON PAP

## 2021-12-26 LAB — SURGICAL PATHOLOGY

## 2021-12-29 ENCOUNTER — Encounter: Payer: Self-pay | Admitting: Gastroenterology

## 2021-12-29 DIAGNOSIS — M6281 Muscle weakness (generalized): Secondary | ICD-10-CM | POA: Diagnosis not present

## 2021-12-29 DIAGNOSIS — M5459 Other low back pain: Secondary | ICD-10-CM | POA: Diagnosis not present

## 2021-12-29 DIAGNOSIS — R2689 Other abnormalities of gait and mobility: Secondary | ICD-10-CM | POA: Diagnosis not present

## 2021-12-29 DIAGNOSIS — G20 Parkinson's disease: Secondary | ICD-10-CM | POA: Diagnosis not present

## 2021-12-30 ENCOUNTER — Encounter (HOSPITAL_COMMUNITY): Payer: Self-pay | Admitting: Gastroenterology

## 2021-12-31 ENCOUNTER — Encounter: Payer: Self-pay | Admitting: Internal Medicine

## 2021-12-31 ENCOUNTER — Telehealth: Payer: Self-pay | Admitting: Physician Assistant

## 2021-12-31 ENCOUNTER — Telehealth: Payer: Self-pay | Admitting: Gastroenterology

## 2021-12-31 DIAGNOSIS — M6281 Muscle weakness (generalized): Secondary | ICD-10-CM | POA: Diagnosis not present

## 2021-12-31 DIAGNOSIS — M5459 Other low back pain: Secondary | ICD-10-CM | POA: Diagnosis not present

## 2021-12-31 DIAGNOSIS — G20 Parkinson's disease: Secondary | ICD-10-CM | POA: Diagnosis not present

## 2021-12-31 DIAGNOSIS — R2689 Other abnormalities of gait and mobility: Secondary | ICD-10-CM | POA: Diagnosis not present

## 2021-12-31 DIAGNOSIS — C801 Malignant (primary) neoplasm, unspecified: Secondary | ICD-10-CM

## 2021-12-31 NOTE — Telephone Encounter (Signed)
Inbound call from patient would like a call back to further discuss results and referral  ?

## 2021-12-31 NOTE — Telephone Encounter (Signed)
Scheduled appt per 4/26 referral. Pt is aware of appt date and time. Pt is aware to arrive 15 mins prior to appt time and to bring and updated insurance card. Pt is aware of appt location.   ?

## 2021-12-31 NOTE — Telephone Encounter (Signed)
I have sent the referral to oncology Dr Burr Medico.  The pt has been advised.   ?

## 2021-12-31 NOTE — Telephone Encounter (Signed)
Yes, please place referral to Dr. Burr Medico per his request. ?Thanks. ?GM ? ?YF, this is a patient who has history of metastatic prostate cancer and now has imaging that showed evidence of a pancreatic dilation and a cystic lesion.  Seen by Dr. Zenia Resides and referred for EUS.  EUS showed a mass in that region.  His biopsies confirm Adenocarcinoma.  Looks like he would like to transition care here to [facility] and has asked for you.  Thanks for being available if you can be. ? ?GM ?

## 2021-12-31 NOTE — Telephone Encounter (Signed)
The pt would like to be referred to Dr Burr Medico oncology here in [place].  He has an oncologist in [place] but he has moved to this area and does not want to travel to [place] for his care any longer.  Is it ok to make this referral?  ?

## 2022-01-01 DIAGNOSIS — C259 Malignant neoplasm of pancreas, unspecified: Secondary | ICD-10-CM | POA: Insufficient documentation

## 2022-01-01 HISTORY — DX: Malignant neoplasm of pancreas, unspecified: C25.9

## 2022-01-01 NOTE — Progress Notes (Signed)
? ? [place] ?[phone]   [phone] ? ?CONSULT NOTE ? ?REFERRING PHYSICIAN: Dr. Rush Landmark  ? ?REASON FOR CONSULTATION:  ?Pancreatic Cancer  ? ?HPI ?[name] [name] is a 81 y.o. 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   ? ?In January 2023, the patient was seen by his PCP for the chief complaint of mid abdominal pain with associated bloating which had been going on for a few months. He initially thought it was gas pain/constipation.  ? The patient had a CT scan of the abdomen and pelvis was performed on 10/02/2021 which showed a new pancreatic ductal dilation with an indeterminant 19 mm hypodense low density mass in the head/uncinate process of the pancreas.  There was also scattered peripherally located clusters of pulmonary nodules in the right middle lobe.  The findings were most likely reflective of infectious or inflammatory changes.  However, given history of malignancy, recommended follow-up.  The patient was referred to gastroenterology.  The patient also had an MRI/MRCP with contrast performed on October 28, 2021.  ? ?The patient's MR abdomen/MRCP showed pancreatic duct diffusely dilated to the level of the superior pancreatic head, measuring up to 0.7 cm.  There is a multi-lobulated, fluid signal cystic lesion measuring 1.8 x 1.0 cm in the the inferior pancreatic head and uncinate. There are multiple additional subcentimeter cystic lesions scattered throughout the pancreas, several of which clearly communicate to the main pancreatic duct. 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 There is no evidence of recurrent or metastatic disease in the abdomen.   ? ?[facility] Dr. AZenia Residesdoes not feel that he is a surgical canidate due to his multiple medical cormorbidites and he is not a canidate for a major operation such as a Whipple.  His baseline CA 19.9 was elevated at 514. ? ?The patient underwent EUS under the care of Dr. MRush Landmarkon 12/25/2021.  This identified an irregular masslike region in the pancreatic head where the pancreatic duct has more significant dilation occurring.  This measured two 8 mm x 20 mm.  There was sonographic evidence to suggest abutment of the portal vein.  An intact surface was seen between the mass in the superior mesenteric artery and celiac trunk suggesting lack of invasion.  There is also evidence of multiple cyst within the Nop and HOP . The final pathology ([id number]) showed malignant cells consistent with adenocarcinoma.   ? ?Of note, the patient is previously seen Dr. BTish Menfor his prostate cancer.  The patient has requested to obtain his medical oncology care closer to home and is transferring care to our clinic. 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 His last PSA was on 10/13/21 and was 0.06. His last Eligard was received on 10/15/21. Dr. BDarlin Priestlynote mentioned that if he ever has progressive disease/castrate resistant prostate cancer, he would recommend Xtandi or apalutamide. He also sees Dr. SDiamantina Providencefrom urology.  ? ?Overall, the patient is accompanied by his son, [name] today.  His daughter is also available by phone.  The patient states that he feels "pretty good" today.  He reports that the abdominal pain has been occurring for several months.  The pain is not daily and often occurs every other day or every 3 days.  He characterizes the pain as "discomfort".   He denies any associated provoking factors for the pain such as eating, positions, activity, etc.  He reports the pain is random and sometimes can last several hours.  He reports laying down or sitting up helps with the pain.  His pain is a 8 out of 10 at its worst.  He reports that taking Tylenol takes his pain level down to a 1 or 2. ? ?The patient also reports that he frequently has back pain due to his multiple compression fractures in his back.  The patient reports that overall his energy does seem to be down somewhat compared to his prior.  He denies any dark urine.  He reports he hydrates well since he has a solitary right kidney.  Denies any jaundice, light stools, chest discomfort, or hemoptysis.  Sometimes he reports shortness on exertion with [name] activities and bending over.  Denies any unusual cough.  If he has an intermittent cough he will use a cough drop.  He denies any nausea except for one occasion 3 weeks ago with associated retching.  Otherwise he denies any vomiting, frequent constipation, or unusual diarrhea (also except for one occasion 1 week ago that was self-limiting).  He denies any fever, chills, night sweats, or unexplained weight loss.  He denies any appetite changes.  He denies any odynophagia or dysphagia. ? ?The patient denies any family history of malignancy.  The patient's mother passed away in her 19s due to old age.  The patient's father also passed away at the age of 42 "sitting in a chair".  The patient's brother has A-fib. ? ?The patient is widowed.  He has 2 biologic children and 3 stepchildren.  He used to work in the [employer] and worked at [employer].  The patient has a brief smoking history having smoked half a pack of cigarettes for approximately 5 years.  He quit in 2012.  The patient does drink 2 glasses of wine daily.  Denies any street drug use. ? ?Socially, the patient lives in [place] independent  living since his wife passed away.  The patient is independent with his activities of daily living.  He sometimes uses a walker if he walks far distances.  ? ? ?HPI ? ?Past Medical History:  ?Diagnosis Date  ? Atrial flutter ([place])   ? BPH (benign prostatic hyperplasia)   ? Cancer [facility])   ? PROSTATE  ? Diverticulosis 2015  ? Parkinson's disease ([place])   ? 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 Prostate cancer metastatic to bone [facility])   ? REM sleep behavior disorder   ? Sleep apnea   ? BiPap  ? ? ?Past Surgical History:  ?Procedure Laterality Date  ? APPENDECTOMY  1963  ? BIOPSY  12/25/2021  ? Procedure: BIOPSY;  Surgeon: Irving Copas., MD;  Location: [place];  Service: Gastroenterology;;  ? COLONOSCOPY  2010, 2015  ? ESOPHAGOGASTRODUODENOSCOPY (EGD) WITH PROPOFOL N/A 12/25/2021  ? Procedure: ESOPHAGOGASTRODUODENOSCOPY (EGD) WITH PROPOFOL;  Surgeon: Rush Landmark Telford Nab., MD;  Location: [place];  Service: Gastroenterology;  Laterality: N/A;  ? EUS N/A 12/25/2021  ? Procedure: UPPER ENDOSCOPIC ULTRASOUND (EUS) RADIAL;  Surgeon:  Mansouraty, Telford Nab., MD;  Location: [place];  Service: Gastroenterology;  Laterality: N/A;  ? FINE NEEDLE ASPIRATION  12/25/2021  ? Procedure: FINE NEEDLE ASPIRATION (FNA) LINEAR;  Surgeon: Irving Copas., MD;  Location: [place];  Service: Gastroenterology;;  ? 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 KYPHOPLASTY N/A 08/15/2020  ? Procedure: L2 compression fracture;  Surgeon: Hessie Knows, MD;  Location: [facility];  Service: Orthopedics;  Laterality: N/A;  ? KYPHOPLASTY N/A 08/29/2020  ? Procedure: T8 KYPHOPLASTY;  Surgeon: Hessie Knows, MD;  Location: [facility];  Service: Orthopedics;  Laterality: N/A;  ? KYPHOPLASTY N/A 11/12/2020  ? Procedure: L1 KYPHOPLASTY;  Surgeon: Hessie Knows, MD;  Location: [facility];  Service: Orthopedics;  Laterality: N/A;  ? ? ?Family History  ?Problem Relation Age of Onset  ? Heart disease  Maternal Grandfather   ? Diabetes Paternal Grandfather   ? Breast cancer Daughter   ? ? ?Social History ?Social History  ? ?Tobacco Use  ? Smoking status: Former  ?  Types: Cigars  ?  Quit date: 09/07/2010  ?  Darreld Mclean

## 2022-01-02 ENCOUNTER — Inpatient Hospital Stay (HOSPITAL_BASED_OUTPATIENT_CLINIC_OR_DEPARTMENT_OTHER): Payer: Medicare Other | Admitting: Physician Assistant

## 2022-01-02 ENCOUNTER — Other Ambulatory Visit: Payer: Self-pay

## 2022-01-02 ENCOUNTER — Inpatient Hospital Stay: Payer: Medicare Other | Attending: Internal Medicine

## 2022-01-02 VITALS — BP 137/83 | HR 52 | Temp 97.3°F | Resp 16 | Wt 152.7 lb

## 2022-01-02 DIAGNOSIS — Z9049 Acquired absence of other specified parts of digestive tract: Secondary | ICD-10-CM | POA: Insufficient documentation

## 2022-01-02 DIAGNOSIS — N189 Chronic kidney disease, unspecified: Secondary | ICD-10-CM | POA: Diagnosis not present

## 2022-01-02 DIAGNOSIS — G20 Parkinson's disease: Secondary | ICD-10-CM | POA: Diagnosis not present

## 2022-01-02 DIAGNOSIS — E785 Hyperlipidemia, unspecified: Secondary | ICD-10-CM | POA: Insufficient documentation

## 2022-01-02 DIAGNOSIS — Z8249 Family history of ischemic heart disease and other diseases of the circulatory system: Secondary | ICD-10-CM | POA: Diagnosis not present

## 2022-01-02 DIAGNOSIS — K862 Cyst of pancreas: Secondary | ICD-10-CM | POA: Diagnosis not present

## 2022-01-02 DIAGNOSIS — C7951 Secondary malignant neoplasm of bone: Secondary | ICD-10-CM | POA: Insufficient documentation

## 2022-01-02 DIAGNOSIS — G4730 Sleep apnea, unspecified: Secondary | ICD-10-CM | POA: Diagnosis not present

## 2022-01-02 DIAGNOSIS — Z79899 Other long term (current) drug therapy: Secondary | ICD-10-CM | POA: Insufficient documentation

## 2022-01-02 DIAGNOSIS — C250 Malignant neoplasm of head of pancreas: Secondary | ICD-10-CM

## 2022-01-02 DIAGNOSIS — Z7901 Long term (current) use of anticoagulants: Secondary | ICD-10-CM | POA: Diagnosis not present

## 2022-01-02 DIAGNOSIS — Z7189 Other specified counseling: Secondary | ICD-10-CM

## 2022-01-02 DIAGNOSIS — Z887 Allergy status to serum and vaccine status: Secondary | ICD-10-CM | POA: Insufficient documentation

## 2022-01-02 DIAGNOSIS — Z87891 Personal history of nicotine dependence: Secondary | ICD-10-CM | POA: Diagnosis not present

## 2022-01-02 DIAGNOSIS — M549 Dorsalgia, unspecified: Secondary | ICD-10-CM | POA: Insufficient documentation

## 2022-01-02 DIAGNOSIS — Z905 Acquired absence of kidney: Secondary | ICD-10-CM | POA: Diagnosis not present

## 2022-01-02 DIAGNOSIS — Z833 Family history of diabetes mellitus: Secondary | ICD-10-CM | POA: Diagnosis not present

## 2022-01-02 DIAGNOSIS — R918 Other nonspecific abnormal finding of lung field: Secondary | ICD-10-CM | POA: Insufficient documentation

## 2022-01-02 DIAGNOSIS — Z803 Family history of malignant neoplasm of breast: Secondary | ICD-10-CM

## 2022-01-02 DIAGNOSIS — R109 Unspecified abdominal pain: Secondary | ICD-10-CM | POA: Insufficient documentation

## 2022-01-02 DIAGNOSIS — C61 Malignant neoplasm of prostate: Secondary | ICD-10-CM

## 2022-01-02 DIAGNOSIS — G8929 Other chronic pain: Secondary | ICD-10-CM | POA: Insufficient documentation

## 2022-01-02 DIAGNOSIS — R140 Abdominal distension (gaseous): Secondary | ICD-10-CM | POA: Diagnosis not present

## 2022-01-02 DIAGNOSIS — N400 Enlarged prostate without lower urinary tract symptoms: Secondary | ICD-10-CM | POA: Insufficient documentation

## 2022-01-02 LAB — CMP (CANCER CENTER ONLY)
ALT: <5 U/L (ref 0–44)
AST: 27 U/L (ref 15–41)
Albumin: 4.5 g/dL (ref 3.5–5.0)
Alkaline Phosphatase: 54 U/L (ref 38–126)
Anion gap: 4 — ABNORMAL LOW (ref 5–15)
BUN: 19 mg/dL (ref 8–23)
CO2: 29 mmol/L (ref 22–32)
Calcium: 10.1 mg/dL (ref 8.9–10.3)
Chloride: 108 mmol/L (ref 98–111)
Creatinine: 1.2 mg/dL (ref 0.61–1.24)
GFR, Estimated: >60 mL/min (ref >60)
Glucose, Bld: 106 mg/dL — ABNORMAL HIGH (ref 70–99)
Potassium: 4.8 mmol/L (ref 3.5–5.1)
Sodium: 141 mmol/L (ref 135–145)
Total Bilirubin: 1 mg/dL (ref 0.3–1.2)
Total Protein: 7.5 g/dL (ref 6.5–8.1)

## 2022-01-02 LAB — CBC WITH DIFFERENTIAL (CANCER CENTER ONLY)
Abs Immature Granulocytes: 0.01 10*3/uL (ref 0.00–0.07)
Basophils Absolute: 0.1 10*3/uL (ref 0.0–0.1)
Basophils Relative: 1 %
Eosinophils Absolute: 0.2 10*3/uL (ref 0.0–0.5)
Eosinophils Relative: 3 %
HCT: 43.6 % (ref 39.0–52.0)
Hemoglobin: 13.7 g/dL (ref 13.0–17.0)
Immature Granulocytes: 0 %
Lymphocytes Relative: 36 %
Lymphs Abs: 2 10*3/uL (ref 0.7–4.0)
MCH: 29.9 pg (ref 26.0–34.0)
MCHC: 31.4 g/dL (ref 30.0–36.0)
MCV: 95.2 fL (ref 80.0–100.0)
Monocytes Absolute: 0.6 10*3/uL (ref 0.1–1.0)
Monocytes Relative: 10 %
Neutro Abs: 2.7 10*3/uL (ref 1.7–7.7)
Neutrophils Relative %: 50 %
Platelet Count: 211 10*3/uL (ref 150–400)
RBC: 4.58 MIL/uL (ref 4.22–5.81)
RDW: 13 % (ref 11.5–15.5)
WBC Count: 5.5 10*3/uL (ref 4.0–10.5)
nRBC: 0 % (ref 0.0–0.2)

## 2022-01-02 NOTE — Progress Notes (Signed)
I met with Mr Golaszewski and his son during his consultation with Cassie Heilingoetter, PA-C and  Dr Burr Medico.  I explained my role as a nurse navigator and provided my contact information. ?I explained the services provided at [facility] and provided written information.All questions were answered.  They verbalized understanding. ? ?

## 2022-01-02 NOTE — Patient Instructions (Addendum)
Summary:  ?--The sample (biopsy) that they took of your tumor was consistent with Pancreatic Cancer. I have attached more information about this on later pages.  ?-We covered a lot of important information at your appointment today regarding what the plan of care is.  ?-Dr. Burr Medico would recommend a CT scan of the chest to complete the staging work-up.  We just want to ensure that the spot in the pancreas has not spread to the lungs.  Someone should call you from the radiology imaging department, but in the event you need their number, there number is  [phone].  If you need to call them, you just tell them your name and your date of birth and that you are trying to schedule your CT scan.  They should be able to pull you up with this information and help get you scheduled. ? ?  ?  ?Referrals or Imaging: ?-CT scan will likely be next week once we have insurance approval. They will call you so please keep a look out for this call. ?-I will refer you to genetics to ensure that you do not have any hereditary cancer, because if positive, then your children should be tested. It is a blood test. They will call you about this appointment ? ?Summary: ?-From looking at your imaging, you have a lot of cyst in your pancreas which several are likely benign but it is likely that the pancreatic cancer arose from this. ?-The tumor is located in the head of the pancreas.  There are a lot of important structures in this area which is why surgery would be very challenging.  Even with surgery, there is a high risk (greater than 50%) that the cancer would come back.  We checked a tumor marker called CA 19.9 which is elevated.  Your number was 514 and normal this is typically less than 35.  This is suggestive that likely you have microscopic disease elsewhere that we can't see yet on imaging.  Given all of this information, your other health problems, age, etc. Dr. Burr Medico agrees with Dr. Zenia Resides that surgery would likely be too  intensive/invasive. Therefore, Dr. Burr Medico recommends considering radiation or chemo. ?-If you decided to do chemo and radiation, Dr. Burr Medico would recommend chemotherapy with 1 chemo drug called gemcitabine (see attached information).  This can be given in several ways such as once a week for couple weeks in a row with a break in between.  Given your age, Dr. Burr Medico would likely arrange for treatment to be given every other week to make it more tolerable.  This can be given in a Port-A-Cath or an IV in the arm.  If you decided to pursue chemotherapy, the purpose of this would be to help with symptoms control (such as your abdominal pain) by hopefully shrinking and preventing further spreading of the tumor as long as possible.  Of course, there are treatment regimens with multiple drugs but this would have more side effects which would impact quality of life which Dr. Burr Medico does not recommend in your case.  Dr. Burr Medico would likely recommend chemotherapy for 3 to 4 months, possibly even up to 6 months.  Arrange for scans after 3 months of treatment or so.  Of course, if you decide to do chemotherapy and are not able to tolerate this, then you are free to stop chemotherapy at any point in time.  After completing a couple months of chemotherapy, Dr. Burr Medico would then refer you to Dr. [name] and radiation  oncology.  If you decided to proceed with chemotherapy, we will arrange for chemo education class.  This involves you sitting down one-on-one with the nurse educator and she will discuss her treatment and a lot more depth.  They will discuss resources, common side effects and how to manage them, and other important information about your care at the clinic. ?-If you decided not to do any chemotherapy, then Dr. Burr Medico would recommend considering radiation.  There are a couple ways that radiation could be done.  This can be given Monday through Friday at her traditional dose for several weeks or given in a shorter course with less  appointments but the radiation dose would be higher and more targeted.  If you are interested in this, we will place referral to Dr. [name] who can talk to you about each of these options in more detail. ?-Of course, it is always an option to not pursue any treatment.  If you are interested in hospice/and/or palliative care, you would just need to let us know so we can help facilitate connecting you with the correct team. ?-We are more than happy to arrange for your PSA checks, Eligard, and bone scans to follow-up on the prostate cancer.  We are also happy to arrange for Reclast which is given once a year. ?-We will recheck baseline labs today including a CBC which checks your blood counts, chemistry panel which checks your kidney liver and electrolytes, and a tumor marker. Your CBC and CMP has resulted at the time of writing this summary. Your labs look wonderful. I am not worried about the blood sugar since the reference range is only if you were fasting, which you did not need to fast today prior to labs.  ?-I would also recommend decreasing your alcohol intake.  ?  ?  ?  ?Follow up:  ?-Please call us back once you have made your final decision and we can help facilitate any referrals, appointments, or prescriptions that need to be sent.  Of course, if you have any questions please do not hesitate to reach out. ?

## 2022-01-05 ENCOUNTER — Other Ambulatory Visit: Payer: Self-pay | Admitting: Hematology

## 2022-01-05 ENCOUNTER — Telehealth: Payer: Self-pay | Admitting: Physician Assistant

## 2022-01-05 ENCOUNTER — Other Ambulatory Visit: Payer: Self-pay | Admitting: Physician Assistant

## 2022-01-05 DIAGNOSIS — G20 Parkinson's disease: Secondary | ICD-10-CM | POA: Diagnosis not present

## 2022-01-05 DIAGNOSIS — M5459 Other low back pain: Secondary | ICD-10-CM | POA: Diagnosis not present

## 2022-01-05 DIAGNOSIS — C250 Malignant neoplasm of head of pancreas: Secondary | ICD-10-CM

## 2022-01-05 DIAGNOSIS — R2689 Other abnormalities of gait and mobility: Secondary | ICD-10-CM | POA: Diagnosis not present

## 2022-01-05 DIAGNOSIS — M6281 Muscle weakness (generalized): Secondary | ICD-10-CM | POA: Diagnosis not present

## 2022-01-05 LAB — CANCER ANTIGEN 19-9: CA 19-9: 730 U/mL — ABNORMAL HIGH (ref 0–35)

## 2022-01-05 MED ORDER — PROCHLORPERAZINE MALEATE 10 MG PO TABS: 10.0000 mg | ORAL_TABLET | Freq: Four times a day (QID) | ORAL | 1 refills | Status: DC | PRN

## 2022-01-05 MED ORDER — ONDANSETRON HCL 8 MG PO TABS: 8.0000 mg | ORAL_TABLET | Freq: Two times a day (BID) | ORAL | 1 refills | Status: DC | PRN

## 2022-01-05 NOTE — Telephone Encounter (Signed)
Scheduled appointment per inbasket message. Left message.   ?

## 2022-01-05 NOTE — Progress Notes (Signed)
START ON PATHWAY REGIMEN - Pancreatic Adenocarcinoma ? ? ?  A cycle is every 28 days: ?    Gemcitabine  ? ?**Always confirm dose/schedule in your pharmacy ordering system** ? ?Patient Characteristics: ?Locally Advanced, Anatomically Unresectable, M0, First Line, PS ? 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

## 2022-01-05 NOTE — Progress Notes (Signed)
Patient on plan of care prior to pathways. 

## 2022-01-05 NOTE — Progress Notes (Signed)
[name] [name] called stating that he would like to move forward with Chemotherapy.  I scheduled his Ct Chest and provided date, time, location, and instructions to him.  I told him I will let Dr Burr Medico know he as chosen to move forward with chemotherapy.  I told him our schedulers will call him with appts for chemo education, lab, f/u and infusion appts. All questions were answered.  He verbalized understanding.  Dr Burr Medico notified. ? ?

## 2022-01-07 ENCOUNTER — Other Ambulatory Visit: Payer: Self-pay

## 2022-01-07 NOTE — Progress Notes (Signed)
The proposed treatment discussed in conference is for discussion purpose only and is not a binding recommendation.  The patients have not been physically examined, or presented with their treatment options.  Therefore, final treatment plans cannot be decided.  

## 2022-01-07 NOTE — Progress Notes (Signed)
I spoke with Mr Spirito and relayed our discussion at GI Conference this morning.  He is not interested in pursuing surgery.  He feels he is not strong enough.  He will proceed with chemotherapy. I reviewed his up coming appts.  All questions were answered.  He verbalized understanding. ? ?

## 2022-01-08 ENCOUNTER — Encounter: Payer: Medicare Other | Admitting: Nutrition

## 2022-01-08 ENCOUNTER — Inpatient Hospital Stay: Payer: Medicare Other | Attending: Internal Medicine

## 2022-01-08 ENCOUNTER — Other Ambulatory Visit: Payer: Self-pay | Admitting: Genetic Counselor

## 2022-01-08 ENCOUNTER — Other Ambulatory Visit: Payer: Self-pay

## 2022-01-08 DIAGNOSIS — M4856XA Collapsed vertebra, not elsewhere classified, lumbar region, initial encounter for fracture: Secondary | ICD-10-CM | POA: Insufficient documentation

## 2022-01-08 DIAGNOSIS — Z887 Allergy status to serum and vaccine status: Secondary | ICD-10-CM | POA: Insufficient documentation

## 2022-01-08 DIAGNOSIS — K861 Other chronic pancreatitis: Secondary | ICD-10-CM | POA: Insufficient documentation

## 2022-01-08 DIAGNOSIS — Z7901 Long term (current) use of anticoagulants: Secondary | ICD-10-CM | POA: Insufficient documentation

## 2022-01-08 DIAGNOSIS — M4854XA Collapsed vertebra, not elsewhere classified, thoracic region, initial encounter for fracture: Secondary | ICD-10-CM | POA: Insufficient documentation

## 2022-01-08 DIAGNOSIS — N183 Chronic kidney disease, stage 3 unspecified: Secondary | ICD-10-CM | POA: Insufficient documentation

## 2022-01-08 DIAGNOSIS — C61 Malignant neoplasm of prostate: Secondary | ICD-10-CM | POA: Insufficient documentation

## 2022-01-08 DIAGNOSIS — I700 Atherosclerosis of aorta: Secondary | ICD-10-CM | POA: Insufficient documentation

## 2022-01-08 DIAGNOSIS — Z79899 Other long term (current) drug therapy: Secondary | ICD-10-CM | POA: Insufficient documentation

## 2022-01-08 DIAGNOSIS — R059 Cough, unspecified: Secondary | ICD-10-CM | POA: Insufficient documentation

## 2022-01-08 DIAGNOSIS — C250 Malignant neoplasm of head of pancreas: Secondary | ICD-10-CM | POA: Insufficient documentation

## 2022-01-08 DIAGNOSIS — R0602 Shortness of breath: Secondary | ICD-10-CM | POA: Insufficient documentation

## 2022-01-08 DIAGNOSIS — J984 Other disorders of lung: Secondary | ICD-10-CM | POA: Insufficient documentation

## 2022-01-08 DIAGNOSIS — I517 Cardiomegaly: Secondary | ICD-10-CM | POA: Insufficient documentation

## 2022-01-08 DIAGNOSIS — G20 Parkinson's disease: Secondary | ICD-10-CM | POA: Insufficient documentation

## 2022-01-08 DIAGNOSIS — C7951 Secondary malignant neoplasm of bone: Secondary | ICD-10-CM | POA: Insufficient documentation

## 2022-01-08 DIAGNOSIS — R5383 Other fatigue: Secondary | ICD-10-CM | POA: Insufficient documentation

## 2022-01-08 DIAGNOSIS — G8929 Other chronic pain: Secondary | ICD-10-CM | POA: Insufficient documentation

## 2022-01-08 DIAGNOSIS — Z5111 Encounter for antineoplastic chemotherapy: Secondary | ICD-10-CM | POA: Insufficient documentation

## 2022-01-08 DIAGNOSIS — Z9049 Acquired absence of other specified parts of digestive tract: Secondary | ICD-10-CM | POA: Insufficient documentation

## 2022-01-08 DIAGNOSIS — Z905 Acquired absence of kidney: Secondary | ICD-10-CM | POA: Insufficient documentation

## 2022-01-08 NOTE — Progress Notes (Signed)
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  I reviewed with Dr Burr Medico and she said it would be ok.  I relayed this to [name] [name].  All questions were answered.  He verbalized understanding. ? ?

## 2022-01-09 ENCOUNTER — Encounter: Payer: Self-pay | Admitting: Internal Medicine

## 2022-01-09 ENCOUNTER — Encounter: Payer: Self-pay | Admitting: Hematology

## 2022-01-12 ENCOUNTER — Ambulatory Visit (HOSPITAL_COMMUNITY)
Admission: RE | Admit: 2022-01-12 | Discharge: 2022-01-12 | Disposition: A | Discharge status: Home / Self Care | Payer: Medicare Other | Source: Ambulatory Visit | Attending: Physician Assistant | Admitting: Physician Assistant

## 2022-01-12 ENCOUNTER — Encounter: Payer: Self-pay | Admitting: Hematology

## 2022-01-12 ENCOUNTER — Encounter (HOSPITAL_COMMUNITY): Payer: Self-pay

## 2022-01-12 DIAGNOSIS — I700 Atherosclerosis of aorta: Secondary | ICD-10-CM | POA: Diagnosis not present

## 2022-01-12 DIAGNOSIS — C250 Malignant neoplasm of head of pancreas: Secondary | ICD-10-CM | POA: Diagnosis not present

## 2022-01-12 DIAGNOSIS — C259 Malignant neoplasm of pancreas, unspecified: Secondary | ICD-10-CM | POA: Diagnosis not present

## 2022-01-12 DIAGNOSIS — R918 Other nonspecific abnormal finding of lung field: Secondary | ICD-10-CM | POA: Diagnosis not present

## 2022-01-12 MED ORDER — SODIUM CHLORIDE (PF) 0.9 % IJ SOLN
INTRAMUSCULAR | Status: AC
  Filled 2022-01-12: qty 50

## 2022-01-12 MED ORDER — IOHEXOL 300 MG/ML  SOLN
75.0000 mL | Freq: Once | INTRAMUSCULAR | Status: AC | PRN
Start: 2022-01-12 — End: 2022-01-12
  Administered 2022-01-12: 75 mL via INTRAVENOUS

## 2022-01-12 NOTE — Progress Notes (Signed)
[name] ?OFFICE PROGRESS NOTE ? ?[name] Cha, MD ?[address] ?[place] [address] ? ?DIAGNOSIS: Pancreatic Cancer ? ?Oncology History Overview Note  ?# PROSTATE CANCER- METATSTATIC to BONE; PSA- 26.5. Sclerotic 1.5 cm left ischial lesion/ Sclerotic medial left iliac bone 1.5 cm lesion (series 2/image 47), ?increased from 1.0 cm. Gleason score of 4+5= 9; with almost all cores involved greater than 80%.  9/16 Lupron 26-monthdepot on 9/16. [Urology; Dr.Siniski] ? ?# MID OCT 2020- Zytiga 1000 mg+ prednisone; stopped December 2021 [poor tolerance if with RVR]; DISCONTINUED.  ? 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 ?# GC- referred ? ?# DECLINES- Palliative care [316/2021] ? ?DIAGNOSIS: Prostate cancer ? ?STAGE:     4    ;  GOALS: Palliative/control ? ?CURRENT/MOST RECENT THERAPY : Lupron.  ? ?  ?  ?Prostate cancer metastatic to bone ([facility]  ?05/24/2019 Initial Diagnosis  ? Prostate cancer metastatic to bone ([facility] ?  ?Pancreatic cancer ([facility]  ?10/02/2021 Imaging  ? CT ABDOMEN PELVIS W CONTRAST  ? ?IMPRESSION: ?1. There is new pancreatic ductal dilation with an indeterminate 19 ?mm hypodense low-density mass area in the head/uncinate process of ?the pancreas. Recommend further evaluation with dedicated MRI/MRCP ?with contrast. ?2. No evidence of bowel obstruction.  Moderate rectal stool ball. ?3. Scattered peripherally located clustered pulmonary nodules in the ?RIGHT middle lobe. Findings are likely infectious or inflammatory in ?etiology. Given history of malignancy, recommend follow-up as per ?clinical protocol. ?  ?10/28/2021 Imaging  ? MR ABDOMEN MRCP W WO CONTAST  ? ?IMPRESSION: ?1. Examination is significantly limited by breath motion artifact ?throughout. ?2. The main pancreatic duct is diffusely dilated from the level of ?the superior pancreatic head, measuring up to 0.7 cm. ?3. In the inferior pancreatic head and uncinate, there is  a ?multilobulated, fluid signal cystic lesion measuring 1.8 x 1.0 cm. ?Due to breath motion artifact it is difficult to determine whether ?this communicates to the adjacent duct. There are multiple ?additional subcentimeter cystic lesions scattered throughout the ?pancreas, several of which clearly communicate to the main ?pancreatic duct. 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 Recommend EUS/FNA for further diagnosis ?given the presence of pancreatic ductal dilatation. ?4. Status post left nephrectomy. ?5. No evidence of recurrent or metastatic disease in the abdomen. ?  ?12/25/2021 Procedure  ? UPPER ENDOSCOPIC ULTRASOUND-By Dr. MRush Landmark? ?- A mass-like region was identified in the pancreatic head where the pancreatic duct dilates ?with multiple cystic regions noted throughout the pancreas as well. The pancreas itself has ?evidence of chronic pancreatitis changes as well. However, ?the endosonographic appearance is suspicious for potential adenocarcinoma. This was staged T2 N0 Mx by endosonographic criteria. T ?- No malignant-appearing lymph nodes were visualized in the celiac region (level 20), ?peripancreatic region and porta hepatis region. ?  ?12/25/2021 Pathology Results  ? CYTOLOGY - NON PAP  ?CASE: [id number]  ? ?FINAL MICROSCOPIC DIAGNOSIS:  ?A. PANCREAS, HEAD, FINE NEEDLE ASPIRATION:  ?- Malignant cells consistent with adenocarcinoma  ? ?  ?01/01/2022 Initial Diagnosis  ? Pancreatic cancer ([facility] ?  ?01/13/2022 -  Chemotherapy  ? Patient is on Treatment Plan : PANCREAS Gemcitabine D1,15 q28d x 4 Cycles  ? ?  ?  ? ? ? ?CURRENT THERAPY: Day 1 cycle 1 Gemcitabine 800 mg/m2 every other week. First dose today on 01/13/22.  ? ?INTERVAL HISTORY: ?[name] CARDIN89y.o. male returns to the clinic today for a  follow-up visit.  The patient was first seen in the clinic on 01/02/2022 for newly diagnosed pancreatic cancer.  The patient is not a surgical candidate.  At his last  appointment, Dr. Burr Medico recommended palliative treatment with radiation with or without chemotherapy.  The patient considered his options and would like to proceed with chemotherapy.  Dr. Burr Medico recommended dose reduced gemcitabine 800 mg per metered squared every other week for tolerability.  The patient is expected to undergo his first cycle of treatment today.  The patient had his chemo education class and does not have any questions. He has his anti-emetics.  ? ?The patient is also going to have a follow-up with genetic counseling to assess for his hereditary predisposition to malignancy.  He denies any new concerns today.  He continues to have intermittent abdominal pain for which Tylenol takes his pain level down to a 1/2 out of 10.  The patient denies any dark urine, jaundice, light stools, chest pain, or hemoptysis.  He sometimes has shortness of breath with bending over but nothing unusual for him. He occasionally has a mild cough/"throat irritation" for which he will use a cough drop.  Denies any nausea, vomiting, diarrhea, or constipation.  He reports a good appetite at this time. He recently had a staging PET scan.  His baseline CA 19.9 drawn on 01/02/22 was elevated at 730.  He is here today for evaluation and repeat blood work before undergoing day 1 cycle 1. ? ? ? ? ?MEDICAL HISTORY: ?Past Medical History:  ?Diagnosis Date  ? Atrial flutter ([facility])   ? BPH (benign prostatic hyperplasia)   ? Cancer [facility])   ? PROSTATE  ? Diverticulosis 2015  ? Parkinson's disease ([facility] [facility])   ? 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 Prostate cancer metastatic to bone [facility])   ? REM sleep behavior disorder   ? Sleep apnea   ? BiPap  ? ? ?ALLERGIES:  is allergic to influenza vaccines. ? ?MEDICATIONS:  ?Current Outpatient Medications  ?Medication Sig Dispense Refill  ? acetaminophen (TYLENOL) 325 MG tablet Take 325 mg by mouth every 6 (six) hours as needed for moderate pain.    ? apixaban  (ELIQUIS) 5 MG TABS tablet Take 1 tablet (5 mg total) by mouth 2 (two) times daily. 60 tablet 1  ? carbidopa-levodopa (SINEMET CR) 50-200 MG tablet Take 1 tablet by mouth at bedtime. 90 tablet 0  ? carbidopa-levodopa (SINEMET IR) 25-100 MG tablet 3 tablets at 7 AM/2 at 11 AM/2 tablets at 3 PM/2 tablet at 7 PM 810 tablet 3  ? clonazePAM (KLONOPIN) 0.5 MG tablet Take 1 tablet by mouth at bedtime.    ? diltiazem (CARDIZEM) 30 MG tablet Take 1 tablet (30 mg total) by mouth 2 (two) times daily. 180 tablet 1  ? Glycerin-Hypromellose-PEG 400 (VISINE DRY EYE OP) Place 1 drop into both eyes 2 (two) times daily as needed (eye irritation).     ? ketoconazole (NIZORAL) 2 % shampoo Apply 1 application topically once a week.     ? leuprolide, 6 Month, (ELIGARD) 45 MG injection Inject 45 mg into the skin every 6 (six) months.    ? omeprazole (PRILOSEC) 20 MG capsule 1 capsule 30 minutes before morning meal    ? ondansetron (ZOFRAN) 8 MG tablet Take 1 tablet (8 mg total) by mouth 2 (two) times daily as needed (Nausea or vomiting). 30 tablet 1  ? prochlorperazine (COMPAZINE) 10 MG tablet Take 1 tablet (10 mg total) by  mouth every 6 (six) hours as needed (Nausea or vomiting). 30 tablet 1  ? rosuvastatin (CRESTOR) 10 MG tablet Take 1 tablet (10 mg total) by mouth daily. 90 tablet 3  ? triamcinolone cream (KENALOG) 0.1 % Apply 1 application topically 2 (two) times daily. (Patient taking differently: Apply 1 application. topically 2 (two) times daily as needed (irritation).) 453.6 g 1  ? zoledronic acid (RECLAST) 5 MG/100ML SOLN injection Inject 5 mg into the vein once.    ? ?No current facility-administered medications for this visit.  ? ? ?SURGICAL HISTORY:  ?Past Surgical History:  ?Procedure Laterality Date  ? APPENDECTOMY  1963  ? BIOPSY  12/25/2021  ? Procedure: BIOPSY;  Surgeon: Irving Copas., MD;  Location: [facility];  Service: Gastroenterology;;  ? COLONOSCOPY  2010, 2015  ? ESOPHAGOGASTRODUODENOSCOPY (EGD) WITH  PROPOFOL N/A 12/25/2021  ? Procedure: ESOPHAGOGASTRODUODENOSCOPY (EGD) WITH PROPOFOL;  Surgeon: Rush Landmark Telford Nab., MD;  Location: [facility];  Service: Gastroenterology;  Laterality: N/A;  ? EUS N/A 4/20/2

## 2022-01-12 NOTE — Progress Notes (Signed)
Called pt to introduce myself as his Arboriculturist and to discuss the [employer].  Pt has 2 insurances so copay assistance shouldn't be needed.  I left a msg requesting he return my call if he's interested in applying for the [employer].  ?

## 2022-01-13 ENCOUNTER — Inpatient Hospital Stay: Payer: Medicare Other

## 2022-01-13 ENCOUNTER — Encounter: Payer: Self-pay | Admitting: Physician Assistant

## 2022-01-13 ENCOUNTER — Inpatient Hospital Stay (HOSPITAL_BASED_OUTPATIENT_CLINIC_OR_DEPARTMENT_OTHER): Payer: Medicare Other | Admitting: Physician Assistant

## 2022-01-13 ENCOUNTER — Other Ambulatory Visit: Payer: Self-pay

## 2022-01-13 ENCOUNTER — Other Ambulatory Visit: Payer: Self-pay | Admitting: Hematology

## 2022-01-13 ENCOUNTER — Encounter: Payer: Self-pay | Admitting: Hematology and Oncology

## 2022-01-13 VITALS — BP 106/69 | HR 55 | Temp 97.3°F | Resp 20 | Ht 66.0 in | Wt 151.3 lb

## 2022-01-13 DIAGNOSIS — C7951 Secondary malignant neoplasm of bone: Secondary | ICD-10-CM | POA: Diagnosis not present

## 2022-01-13 DIAGNOSIS — Z8546 Personal history of malignant neoplasm of prostate: Secondary | ICD-10-CM | POA: Diagnosis not present

## 2022-01-13 DIAGNOSIS — C250 Malignant neoplasm of head of pancreas: Secondary | ICD-10-CM

## 2022-01-13 DIAGNOSIS — I517 Cardiomegaly: Secondary | ICD-10-CM | POA: Diagnosis not present

## 2022-01-13 DIAGNOSIS — M4856XA Collapsed vertebra, not elsewhere classified, lumbar region, initial encounter for fracture: Secondary | ICD-10-CM | POA: Diagnosis not present

## 2022-01-13 DIAGNOSIS — Z5111 Encounter for antineoplastic chemotherapy: Secondary | ICD-10-CM

## 2022-01-13 DIAGNOSIS — Z9049 Acquired absence of other specified parts of digestive tract: Secondary | ICD-10-CM | POA: Diagnosis not present

## 2022-01-13 DIAGNOSIS — I700 Atherosclerosis of aorta: Secondary | ICD-10-CM | POA: Diagnosis not present

## 2022-01-13 DIAGNOSIS — Z887 Allergy status to serum and vaccine status: Secondary | ICD-10-CM | POA: Diagnosis not present

## 2022-01-13 DIAGNOSIS — Z7901 Long term (current) use of anticoagulants: Secondary | ICD-10-CM | POA: Diagnosis not present

## 2022-01-13 DIAGNOSIS — R5383 Other fatigue: Secondary | ICD-10-CM | POA: Diagnosis not present

## 2022-01-13 DIAGNOSIS — K861 Other chronic pancreatitis: Secondary | ICD-10-CM | POA: Diagnosis not present

## 2022-01-13 DIAGNOSIS — N183 Chronic kidney disease, stage 3 unspecified: Secondary | ICD-10-CM | POA: Diagnosis not present

## 2022-01-13 DIAGNOSIS — J984 Other disorders of lung: Secondary | ICD-10-CM | POA: Diagnosis not present

## 2022-01-13 DIAGNOSIS — Z905 Acquired absence of kidney: Secondary | ICD-10-CM | POA: Diagnosis not present

## 2022-01-13 DIAGNOSIS — C61 Malignant neoplasm of prostate: Secondary | ICD-10-CM | POA: Diagnosis not present

## 2022-01-13 DIAGNOSIS — R0602 Shortness of breath: Secondary | ICD-10-CM | POA: Diagnosis not present

## 2022-01-13 DIAGNOSIS — G8929 Other chronic pain: Secondary | ICD-10-CM | POA: Diagnosis not present

## 2022-01-13 DIAGNOSIS — M4854XA Collapsed vertebra, not elsewhere classified, thoracic region, initial encounter for fracture: Secondary | ICD-10-CM | POA: Diagnosis not present

## 2022-01-13 DIAGNOSIS — G20 Parkinson's disease: Secondary | ICD-10-CM | POA: Diagnosis not present

## 2022-01-13 DIAGNOSIS — Z8507 Personal history of malignant neoplasm of pancreas: Secondary | ICD-10-CM | POA: Diagnosis not present

## 2022-01-13 DIAGNOSIS — Z79899 Other long term (current) drug therapy: Secondary | ICD-10-CM | POA: Diagnosis not present

## 2022-01-13 DIAGNOSIS — Z803 Family history of malignant neoplasm of breast: Secondary | ICD-10-CM | POA: Diagnosis not present

## 2022-01-13 DIAGNOSIS — R059 Cough, unspecified: Secondary | ICD-10-CM | POA: Diagnosis not present

## 2022-01-13 LAB — GENETIC SCREENING ORDER

## 2022-01-13 LAB — CMP (CANCER CENTER ONLY)
ALT: 8 U/L (ref 0–44)
AST: 31 U/L (ref 15–41)
Albumin: 4.4 g/dL (ref 3.5–5.0)
Alkaline Phosphatase: 46 U/L (ref 38–126)
Anion gap: 7 (ref 5–15)
BUN: 25 mg/dL — ABNORMAL HIGH (ref 8–23)
CO2: 26 mmol/L (ref 22–32)
Calcium: 9.9 mg/dL (ref 8.9–10.3)
Chloride: 106 mmol/L (ref 98–111)
Creatinine: 1.19 mg/dL (ref 0.61–1.24)
GFR, Estimated: >60 mL/min (ref >60)
Glucose, Bld: 104 mg/dL — ABNORMAL HIGH (ref 70–99)
Potassium: 4.8 mmol/L (ref 3.5–5.1)
Sodium: 139 mmol/L (ref 135–145)
Total Bilirubin: 1.1 mg/dL (ref 0.3–1.2)
Total Protein: 7.3 g/dL (ref 6.5–8.1)

## 2022-01-13 LAB — CBC WITH DIFFERENTIAL (CANCER CENTER ONLY)
Abs Immature Granulocytes: 0.01 10*3/uL (ref 0.00–0.07)
Basophils Absolute: 0.1 10*3/uL (ref 0.0–0.1)
Basophils Relative: 1 %
Eosinophils Absolute: 0.1 10*3/uL (ref 0.0–0.5)
Eosinophils Relative: 3 %
HCT: 41.2 % (ref 39.0–52.0)
Hemoglobin: 13.4 g/dL (ref 13.0–17.0)
Immature Granulocytes: 0 %
Lymphocytes Relative: 34 %
Lymphs Abs: 1.9 10*3/uL (ref 0.7–4.0)
MCH: 30.5 pg (ref 26.0–34.0)
MCHC: 32.5 g/dL (ref 30.0–36.0)
MCV: 93.8 fL (ref 80.0–100.0)
Monocytes Absolute: 0.6 10*3/uL (ref 0.1–1.0)
Monocytes Relative: 10 %
Neutro Abs: 2.9 10*3/uL (ref 1.7–7.7)
Neutrophils Relative %: 52 %
Platelet Count: 188 10*3/uL (ref 150–400)
RBC: 4.39 MIL/uL (ref 4.22–5.81)
RDW: 13 % (ref 11.5–15.5)
WBC Count: 5.6 10*3/uL (ref 4.0–10.5)
nRBC: 0 % (ref 0.0–0.2)

## 2022-01-13 MED ORDER — SODIUM CHLORIDE 0.9 % IV SOLN
800.0000 mg/m2 | Freq: Once | INTRAVENOUS | Status: AC
  Administered 2022-01-13: 1444 mg via INTRAVENOUS
  Filled 2022-01-13: qty 37.98

## 2022-01-13 MED ORDER — PROCHLORPERAZINE MALEATE 10 MG PO TABS
10.0000 mg | ORAL_TABLET | Freq: Once | ORAL | Status: AC
  Administered 2022-01-13: 10 mg via ORAL
  Filled 2022-01-13: qty 1

## 2022-01-13 MED ORDER — SODIUM CHLORIDE 0.9 % IV SOLN: Freq: Once | INTRAVENOUS | Status: AC

## 2022-01-13 NOTE — Progress Notes (Signed)
Patient tolerated Gemzar well.  Increased fluids to run concurrent at 314m. ?

## 2022-01-13 NOTE — Patient Instructions (Signed)
[facility]  Discharge Instructions: ?Thank you for choosing [facility] to provide your oncology and hematology care.  ? ?If you have a lab appointment with the [facility], please go directly to the [facility] and check in at the registration area. ?  ?Wear comfortable clothing and clothing appropriate for easy access to any Portacath or PICC line.  ? ?We strive to give you quality time with your provider. You may need to reschedule your appointment if you arrive late (15 or more minutes).  Arriving late affects you and other patients whose appointments are after yours.  Also, if you miss three or more appointments without notifying the office, you may be dismissed from the clinic at the provider?s discretion.    ?  ?For prescription refill requests, have your pharmacy contact our office and allow 72 hours for refills to be completed.   ? ?Today you received the following chemotherapy and/or immunotherapy agent: Gemzar    ?  ?To help prevent nausea and vomiting after your treatment, we encourage you to take your nausea medication as directed. ? ?BELOW ARE SYMPTOMS THAT SHOULD BE REPORTED IMMEDIATELY: ?*FEVER GREATER THAN 100.4 F (38 ?C) OR HIGHER ?*CHILLS OR SWEATING ?*NAUSEA AND VOMITING THAT IS NOT CONTROLLED WITH YOUR NAUSEA MEDICATION ?*UNUSUAL SHORTNESS OF BREATH ?*UNUSUAL BRUISING OR BLEEDING ?*URINARY PROBLEMS (pain or burning when urinating, or frequent urination) ?*BOWEL PROBLEMS (unusual diarrhea, constipation, pain near the anus) ?TENDERNESS IN MOUTH AND THROAT WITH OR WITHOUT PRESENCE OF ULCERS (sore throat, sores in mouth, or a toothache) ?UNUSUAL RASH, SWELLING OR PAIN  ?UNUSUAL VAGINAL DISCHARGE OR ITCHING  ? ?Items with * indicate a potential emergency and should be followed up as soon as possible or go to the Emergency Department if any problems should occur. ? ?Please show the CHEMOTHERAPY ALERT CARD or IMMUNOTHERAPY ALERT CARD at check-in to the  Emergency Department and triage nurse. ? ?Should you have questions after your visit or need to cancel or reschedule your appointment, please contact [facility]  Dept: [phone]  and follow the prompts.  Office hours are 8:00 a.m. to 4:30 p.m. Monday - Friday. Please note that voicemails left after 4:00 p.m. may not be returned until the following business day.  We are closed weekends and major holidays. You have access to a nurse at all times for urgent questions. Please call the main number to the clinic Dept: [phone] and follow the prompts. ? ? ?For any non-urgent questions, you may also contact your provider using MyChart. We now offer e-Visits for anyone 91 and older to request care online for non-urgent symptoms. For details visit [email or web address]. ?  ?Also download the MyChart app! Go to the app store, search "MyChart", open the app, select [facility], and log in with your MyChart username and password. ? ?Due to Covid, a mask is required upon entering the hospital/clinic. If you do not have a mask, one will be given to you upon arrival. For doctor visits, patients may have 1 support person aged 54 or older with them. For treatment visits, patients cannot have anyone with them due to current Covid guidelines and our immunocompromised population.  ? ?Gemcitabine injection ?What is this medication? ?GEMCITABINE (jem SYE ta been) is a chemotherapy drug. This medicine is used to treat many types of cancer like breast cancer, lung cancer, pancreatic cancer, and ovarian cancer. ?This medicine may be used for other purposes; ask your health care provider or  pharmacist if you have questions. ?COMMON BRAND NAME(S): Gemzar, Infugem ?What should I tell my care team before I take this medication? ?They need to know if you have any of these conditions: ?blood disorders ?infection ?kidney disease ?liver disease ?lung or breathing disease, like asthma ?recent or ongoing  radiation therapy ?an unusual or allergic reaction to gemcitabine, other chemotherapy, other medicines, foods, dyes, or preservatives ?pregnant or trying to get pregnant ?breast-feeding ?How should I use this medication? ?This drug is given as an infusion into a vein. It is administered in a hospital or clinic by a specially trained health care professional. ?Talk to your pediatrician regarding the use of this medicine in children. Special care may be needed. ?Overdosage: If you think you have taken too much of this medicine contact a poison control center or emergency room at once. ?NOTE: This medicine is only for you. Do not share this medicine with others. ?What if I miss a dose? ?It is important not to miss your dose. Call your doctor or health care professional if you are unable to keep an appointment. ?What may interact with this medication? ?medicines to increase blood counts like filgrastim, pegfilgrastim, sargramostim ?some other chemotherapy drugs like cisplatin ?vaccines ?Talk to your doctor or health care professional before taking any of these medicines: ?acetaminophen ?aspirin ?ibuprofen ?ketoprofen ?naproxen ?This list may not describe all possible interactions. Give your health care provider a list of all the medicines, herbs, non-prescription drugs, or dietary supplements you use. Also tell them if you smoke, drink alcohol, or use illegal drugs. Some items may interact with your medicine. ?What should I watch for while using this medication? ?Visit your doctor for checks on your progress. This drug may make you feel generally unwell. This is not uncommon, as chemotherapy can affect healthy cells as well as cancer cells. Report any side effects. Continue your course of treatment even though you feel ill unless your doctor tells you to stop. ?In some cases, you may be given additional medicines to help with side effects. Follow all directions for their use. ?Call your doctor or health care  professional for advice if you get a fever, chills or sore throat, or other symptoms of a cold or flu. Do not treat yourself. This drug decreases your body's ability to fight infections. Try to avoid being around people who are sick. ?This medicine may increase your risk to bruise or bleed. Call your doctor or health care professional if you notice any unusual bleeding. ?Be careful brushing and flossing your teeth or using a toothpick because you may get an infection or bleed more easily. If you have any dental work done, tell your dentist you are receiving this medicine. ?Avoid taking products that contain aspirin, acetaminophen, ibuprofen, naproxen, or ketoprofen unless instructed by your doctor. These medicines may hide a fever. ?Do not become pregnant while taking this medicine or for 6 months after stopping it. Women should inform their doctor if they wish to become pregnant or think they might be pregnant. Men should not father a child while taking this medicine and for 3 months after stopping it. There is a potential for serious side effects to an unborn child. Talk to your health care professional or pharmacist for more information. Do not breast-feed an infant while taking this medicine or for at least 1 week after stopping it. ?Men should inform their doctors if they wish to father a child. This medicine may lower sperm counts. Talk with your doctor or health care  professional if you are concerned about your fertility. ?What side effects may I notice from receiving this medication? ?Side effects that you should report to your doctor or health care professional as soon as possible: ?allergic reactions like skin rash, itching or hives, swelling of the face, lips, or tongue ?breathing problems ?pain, redness, or irritation at site where injected ?signs and symptoms of a dangerous change in heartbeat or heart rhythm like chest pain; dizziness; fast or irregular heartbeat; palpitations; feeling faint or  lightheaded, falls; breathing problems ?signs of decreased platelets or bleeding - bruising, pinpoint red spots on the skin, black, tarry stools, blood in the urine ?signs of decreased red blood cells - unusually weak or tire

## 2022-01-14 LAB — CANCER ANTIGEN 19-9: CA 19-9: 674 U/mL — ABNORMAL HIGH (ref 0–35)

## 2022-01-20 ENCOUNTER — Telehealth: Payer: Self-pay

## 2022-01-20 DIAGNOSIS — G4752 REM sleep behavior disorder: Secondary | ICD-10-CM | POA: Diagnosis not present

## 2022-01-20 DIAGNOSIS — D6859 Other primary thrombophilia: Secondary | ICD-10-CM | POA: Diagnosis not present

## 2022-01-20 DIAGNOSIS — C61 Malignant neoplasm of prostate: Secondary | ICD-10-CM | POA: Diagnosis not present

## 2022-01-20 DIAGNOSIS — G4733 Obstructive sleep apnea (adult) (pediatric): Secondary | ICD-10-CM | POA: Diagnosis not present

## 2022-01-20 DIAGNOSIS — N481 Balanitis: Secondary | ICD-10-CM | POA: Diagnosis not present

## 2022-01-20 DIAGNOSIS — I4892 Unspecified atrial flutter: Secondary | ICD-10-CM | POA: Diagnosis not present

## 2022-01-20 DIAGNOSIS — C259 Malignant neoplasm of pancreas, unspecified: Secondary | ICD-10-CM | POA: Diagnosis not present

## 2022-01-20 DIAGNOSIS — C7951 Secondary malignant neoplasm of bone: Secondary | ICD-10-CM | POA: Diagnosis not present

## 2022-01-20 NOTE — Telephone Encounter (Signed)
This nurse spoke with patient related to sore spots and itching to penis.  Patient stated that he saw his primary care physician today and was prescribed an antifungal.  This nurse advised that provider would be made aware.  Also notified that the provider suggests to keep area clean and dry.  Patient acknowledged understanding.  No further concerns or questions at this time.   ?

## 2022-01-26 ENCOUNTER — Other Ambulatory Visit: Payer: Self-pay

## 2022-01-26 DIAGNOSIS — C250 Malignant neoplasm of head of pancreas: Secondary | ICD-10-CM

## 2022-01-26 DIAGNOSIS — C61 Malignant neoplasm of prostate: Secondary | ICD-10-CM

## 2022-01-27 ENCOUNTER — Inpatient Hospital Stay (HOSPITAL_BASED_OUTPATIENT_CLINIC_OR_DEPARTMENT_OTHER): Payer: Medicare Other | Admitting: Hematology

## 2022-01-27 ENCOUNTER — Inpatient Hospital Stay: Payer: Medicare Other

## 2022-01-27 ENCOUNTER — Encounter: Payer: Self-pay | Admitting: Hematology

## 2022-01-27 ENCOUNTER — Other Ambulatory Visit: Payer: Self-pay

## 2022-01-27 VITALS — BP 111/71 | HR 54 | Temp 97.5°F | Resp 18 | Ht 66.0 in | Wt 154.6 lb

## 2022-01-27 DIAGNOSIS — C7951 Secondary malignant neoplasm of bone: Secondary | ICD-10-CM

## 2022-01-27 DIAGNOSIS — C250 Malignant neoplasm of head of pancreas: Secondary | ICD-10-CM | POA: Diagnosis not present

## 2022-01-27 DIAGNOSIS — Z5111 Encounter for antineoplastic chemotherapy: Secondary | ICD-10-CM | POA: Diagnosis not present

## 2022-01-27 DIAGNOSIS — C61 Malignant neoplasm of prostate: Secondary | ICD-10-CM | POA: Diagnosis not present

## 2022-01-27 DIAGNOSIS — N183 Chronic kidney disease, stage 3 unspecified: Secondary | ICD-10-CM | POA: Diagnosis not present

## 2022-01-27 DIAGNOSIS — R0602 Shortness of breath: Secondary | ICD-10-CM | POA: Diagnosis not present

## 2022-01-27 LAB — CBC WITH DIFFERENTIAL (CANCER CENTER ONLY)
Abs Immature Granulocytes: 0.01 10*3/uL (ref 0.00–0.07)
Basophils Absolute: 0 10*3/uL (ref 0.0–0.1)
Basophils Relative: 0 %
Eosinophils Absolute: 0.1 10*3/uL (ref 0.0–0.5)
Eosinophils Relative: 3 %
HCT: 37.8 % — ABNORMAL LOW (ref 39.0–52.0)
Hemoglobin: 12.4 g/dL — ABNORMAL LOW (ref 13.0–17.0)
Immature Granulocytes: 0 %
Lymphocytes Relative: 31 %
Lymphs Abs: 1.5 10*3/uL (ref 0.7–4.0)
MCH: 30.6 pg (ref 26.0–34.0)
MCHC: 32.8 g/dL (ref 30.0–36.0)
MCV: 93.3 fL (ref 80.0–100.0)
Monocytes Absolute: 0.5 10*3/uL (ref 0.1–1.0)
Monocytes Relative: 11 %
Neutro Abs: 2.6 10*3/uL (ref 1.7–7.7)
Neutrophils Relative %: 55 %
Platelet Count: 167 10*3/uL (ref 150–400)
RBC: 4.05 MIL/uL — ABNORMAL LOW (ref 4.22–5.81)
RDW: 13.1 % (ref 11.5–15.5)
WBC Count: 4.8 10*3/uL (ref 4.0–10.5)
nRBC: 0 % (ref 0.0–0.2)

## 2022-01-27 LAB — CMP (CANCER CENTER ONLY)
ALT: <5 U/L (ref 0–44)
AST: 25 U/L (ref 15–41)
Albumin: 4 g/dL (ref 3.5–5.0)
Alkaline Phosphatase: 43 U/L (ref 38–126)
Anion gap: 4 — ABNORMAL LOW (ref 5–15)
BUN: 22 mg/dL (ref 8–23)
CO2: 27 mmol/L (ref 22–32)
Calcium: 9.3 mg/dL (ref 8.9–10.3)
Chloride: 107 mmol/L (ref 98–111)
Creatinine: 1.18 mg/dL (ref 0.61–1.24)
GFR, Estimated: >60 mL/min (ref >60)
Glucose, Bld: 135 mg/dL — ABNORMAL HIGH (ref 70–99)
Potassium: 4.3 mmol/L (ref 3.5–5.1)
Sodium: 138 mmol/L (ref 135–145)
Total Bilirubin: 0.8 mg/dL (ref 0.3–1.2)
Total Protein: 6.9 g/dL (ref 6.5–8.1)

## 2022-01-27 MED ORDER — SODIUM CHLORIDE 0.9 % IV SOLN: Freq: Once | INTRAVENOUS | Status: AC

## 2022-01-27 MED ORDER — SODIUM CHLORIDE 0.9 % IV SOLN
1000.0000 mg/m2 | Freq: Once | INTRAVENOUS | Status: AC
  Administered 2022-01-27: 1786 mg via INTRAVENOUS
  Filled 2022-01-27: qty 46.97

## 2022-01-27 MED ORDER — PROCHLORPERAZINE MALEATE 10 MG PO TABS
10.0000 mg | ORAL_TABLET | Freq: Once | ORAL | Status: AC
  Administered 2022-01-27: 10 mg via ORAL
  Filled 2022-01-27: qty 1

## 2022-01-27 NOTE — Progress Notes (Signed)
[place]   Telephone:([phone]) [phone] Fax:([phone]) [phone]   Clinic Follow up Note   Patient Care Team: [name], MD as PCP - General (Internal Medicine) Tat, Eustace Quail, DO as Consulting Physician (Neurology) Cammie Sickle, MD as Consulting Physician (Hematology and Oncology)  Date of Service:  01/27/2022  CHIEF COMPLAINT: f/u of pancreatic cancer  CURRENT THERAPY:  Gemcitabine alone, q2weeks, started 01/13/22  ASSESSMENT & PLAN:  [name] [name] is a 81 y.o. male with   1. Pancreatic adenocarcinoma (T2, N0, Mx) -presented with abdominal discomfort. CT AP on 10/02/21, and MRI/MRCP on 10/28/21 showed 1.8 cm mass in pancreatic head and uncinate. 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 Staged at T2 N0. -baseline CA 19-9 from 01/02/22 was 730. -he met with Dr. Zenia Resides and is felt not to be a good surgical candidate given his multiple medical comorbidities. -staging chest CT showed no definite evidence of metastatic disease. -he began single agent gemcitabine on 01/13/22, to be given every 2 weeks. Plan is for 3-4, or up to 6, months of treatment depending on tolerance. -we again discussed port placement. He is interested now; I will send a message to Dr. Zenia Resides. -labs reviewed, overall stable and as expected on treatment thus far. Adequate to proceed with second dose today at full dose. -If he continues to tolerating well, we can change his treatment to day 1, 8 every 21 days -Plan for chemo 3 to 6 months, followed by consolidation radiation.   2. Metastatic prostate cancer  -initially diagnosed with Gleason 4+5 prostate cancer in 04/2019 for elevated PSA of 26.5. Staging CT AP on 05/16/19 showed two suspicious left pelvic lesions. He was started on Eligard by Dr. Diamantina Providence in urology; receiving q65month, next due ~7/26.  -Patient also receives Reclast once a year. -he did  not tolerate Zytiga -Last PSA drawn on 10/13/21 showed good response at 0.06.    3. Abdominal pain -Continue to monitor for now.  Symptoms currently controlled with Tylenol as needed.  4. Family history of cancer  -despite having no family history of malignancy, he does qualify for genetic testing due to hi pancreatic cancer diagnosis. -testing performed on 01/13/22; results are pending.   5. 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 Sees Dr. TCarles Colletfrom neurology -Socially, the patient lives in independent living [place]  He performs his ADLs.  He has a walker if he walks far distances.  The patient's son lives 10 minutes away -The patient has a solitary right kidney.  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  Has some level of kidney disease.    Plan:  -proceed with second gemcitabine today as scheduled at full dose  -referral to Dr. AZenia Residesfor port placement -lab, f/u, and gemcitabine every 2 weeks   No problem-specific Assessment & Plan notes found for this encounter.   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 Sclerotic 1.5 cm left ischial lesion/ Sclerotic medial left iliac bone 1.5 cm lesion (series 2/image 47), increased from 1.0 cm. Gleason score of 4+5= 9; with almost all cores involved greater than 80%.  9/16 Lupron [id number] on 9/16. [Urology; Dr.Siniski]  # MID OCT 2020- Zytiga 1000 mg+ prednisone; stopped December 2021 [poor  tolerance if with RVR]; DISCONTINUED.   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       Prostate cancer  metastatic to bone ([place])  05/24/2019 Initial Diagnosis   Prostate cancer metastatic to bone [facility])   Pancreatic cancer ([place])  10/02/2021 Imaging   CT ABDOMEN PELVIS W CONTRAST   IMPRESSION: 1. There is new pancreatic ductal dilation with an indeterminate 19 mm hypodense low-density mass area in the head/uncinate process of the pancreas. Recommend further evaluation with dedicated MRI/MRCP with contrast. 2. No evidence of bowel obstruction.  Moderate rectal stool ball. 3. Scattered peripherally located clustered pulmonary nodules in the RIGHT middle lobe. Findings are likely infectious or inflammatory in etiology. Given history of malignancy, recommend follow-up as per clinical protocol.   10/28/2021 Imaging   MR ABDOMEN MRCP W WO CONTAST   IMPRESSION: 1. Examination is significantly limited by breath motion artifact throughout. 2. The main pancreatic duct is diffusely dilated from the level of the superior pancreatic head, measuring up to 0.7 cm. 3. In the inferior pancreatic head and uncinate, there is a multilobulated, fluid signal cystic lesion measuring 1.8 x 1.0 cm. Due to breath motion artifact it is difficult to determine whether this communicates to the adjacent duct. There are multiple additional subcentimeter cystic lesions scattered throughout the pancreas, several of which clearly communicate to the main pancreatic duct. 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 Recommend EUS/FNA for further diagnosis given the presence of pancreatic ductal dilatation. 4. Status post left nephrectomy. 5. No evidence of recurrent or metastatic disease in the abdomen.   12/25/2021 Procedure   UPPER ENDOSCOPIC ULTRASOUND-By Dr. Rush Landmark  - A mass-like region was identified in the pancreatic head where the pancreatic duct dilates with multiple cystic regions noted throughout the pancreas as well. The pancreas itself has evidence of chronic  pancreatitis changes as well. However, the endosonographic appearance is suspicious for potential adenocarcinoma. This was staged T2 N0 Mx by endosonographic criteria. T - No malignant-appearing lymph nodes were visualized in the celiac region (level 20), peripancreatic region and porta hepatis region.   12/25/2021 Pathology Results   CYTOLOGY - NON PAP  CASE: [id number]   FINAL MICROSCOPIC DIAGNOSIS:  A. PANCREAS, HEAD, FINE NEEDLE ASPIRATION:  - Malignant cells consistent with adenocarcinoma     01/01/2022 Initial Diagnosis   Pancreatic cancer ([place])   01/13/2022 -  Chemotherapy   Patient is on Treatment Plan : PANCREAS Gemcitabine D1,15 q28d x 4 Cycles      01/26/2022 Cancer Staging   Staging form: Exocrine Pancreas, AJCC 8th Edition - Clinical: Stage IB (cT2, cN0, cM0) - Signed by Truitt Merle, MD on 01/26/2022 Total positive nodes: 0       INTERVAL HISTORY:  [name] [name] is here for a follow up of pancreatic cancer. He was last seen by PA Cassie on 01/13/22. He presents to the clinic accompanied by his daughter. He reports he had fatigue following infusion. He denies nausea. He reports some continued abdominal discomfort, which he feels is still present at this time. He explains, "sometimes my stomach feels like a bowling ball."   All other systems were reviewed with the patient and are negative.  MEDICAL HISTORY:  Past Medical History:  Diagnosis Date   Atrial flutter ([place])  BPH (benign prostatic hyperplasia)    Cancer (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)    Prostate cancer metastatic to bone ([place])    REM sleep behavior disorder    Sleep apnea    BiPap    SURGICAL HISTORY: Past Surgical History:  Procedure Laterality Date   APPENDECTOMY  1963   BIOPSY  12/25/2021   Procedure: BIOPSY;  Surgeon: Irving Copas., MD;  Location: [facility] ENDOSCOPY;   Service: Gastroenterology;;   COLONOSCOPY  2010, 2015   ESOPHAGOGASTRODUODENOSCOPY (EGD) WITH PROPOFOL N/A 12/25/2021   Procedure: ESOPHAGOGASTRODUODENOSCOPY (EGD) WITH PROPOFOL;  Surgeon: Irving Copas., MD;  Location: [place];  Service: Gastroenterology;  Laterality: N/A;   EUS N/A 12/25/2021   Procedure: UPPER ENDOSCOPIC ULTRASOUND (EUS) RADIAL;  Surgeon: Irving Copas., MD;  Location: [place];  Service: Gastroenterology;  Laterality: N/A;   FINE NEEDLE ASPIRATION  12/25/2021   Procedure: FINE NEEDLE ASPIRATION (FNA) LINEAR;  Surgeon: Irving Copas., MD;  Location: [facility] ENDOSCOPY;  Service: Gastroenterology;;   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  ALLERGIES:  is allergic to influenza vaccines.  MEDICATIONS:  Current Outpatient Medications  Medication Sig Dispense Refill   acetaminophen (TYLENOL) 325 MG tablet Take 325 mg by mouth every 6 (six) hours as needed for moderate pain.     apixaban (ELIQUIS) 5 MG TABS tablet Take 1 tablet (5 mg total) by mouth 2 (two) times daily. 60 tablet 1   carbidopa-levodopa (SINEMET CR) 50-200 MG tablet Take 1 tablet by mouth at bedtime. 90 tablet 0   carbidopa-levodopa (SINEMET IR) 25-100 MG tablet 3 tablets at 7 AM/2 at 11 AM/2 tablets at 3 PM/2 tablet at 7 PM 810 tablet 3   clonazePAM (KLONOPIN) 0.5 MG tablet Take 1 tablet by mouth at bedtime.     diltiazem  (CARDIZEM) 30 MG tablet Take 1 tablet (30 mg total) by mouth 2 (two) times daily. 180 tablet 1   Glycerin-Hypromellose-PEG 400 (VISINE DRY EYE OP) Place 1 drop into both eyes 2 (two) times daily as needed (eye irritation).      ketoconazole (NIZORAL) 2 % shampoo Apply 1 application topically once a week.      leuprolide, 6 Month, (ELIGARD) 45 MG injection Inject 45 mg into the skin every 6 (six) months.     omeprazole (PRILOSEC) 20 MG capsule 1 capsule 30 minutes before morning meal     ondansetron (ZOFRAN) 8 MG tablet Take 1 tablet (8 mg total) by mouth 2 (two) times daily as needed (Nausea or vomiting). 30 tablet 1   prochlorperazine (COMPAZINE) 10 MG tablet Take 1 tablet (10 mg total) by mouth every 6 (six) hours as needed (Nausea or vomiting). 30 tablet 1   rosuvastatin (CRESTOR) 10 MG tablet Take 1 tablet (10 mg total) by mouth daily. 90 tablet 3  triamcinolone cream (KENALOG) 0.1 % Apply 1 application topically 2 (two) times daily. (Patient taking differently: Apply 1 application. topically 2 (two) times daily as needed (irritation).) 453.6 g 1   zoledronic acid (RECLAST) 5 MG/100ML SOLN injection Inject 5 mg into the vein once.     No current facility-administered medications for this visit.   Facility-Administered Medications Ordered in Other Visits  Medication Dose Route Frequency Provider Last Rate Last Admin   0.9 %  sodium chloride infusion   Intravenous Once Truitt Merle, MD       gemcitabine (GEMZAR) 1,786 mg in sodium chloride 0.9 % 250 mL chemo infusion  1,000 mg/m2 (Treatment Plan Recorded) Intravenous Once Truitt Merle, MD        PHYSICAL EXAMINATION: ECOG PERFORMANCE STATUS: 2 - Symptomatic, <50% confined to bed  Vitals:   01/27/22 1304  BP: 111/71  Pulse: (!) 54  Resp: 18  Temp: (!) 97.5 F (36.4 C)  SpO2: 99%   Wt Readings from Last 3 Encounters:  01/27/22 154 lb 9.6 oz (70.1 kg)  01/13/22 151 lb 4.8 oz (68.6 kg)  01/02/22 152 lb 11.2 oz (69.3 kg)      GENERAL:alert, no distress and comfortable SKIN: skin color normal, no rashes or significant lesions EYES: normal, Conjunctiva are pink and non-injected, sclera clear  NEURO: alert & oriented x 3 with fluent speech  LABORATORY DATA:  I have reviewed the data as listed    Latest Ref Rng & Units 01/27/2022   12:45 PM 01/13/2022    2:10 PM 01/02/2022    3:04 PM  CBC  WBC 4.0 - 10.5 K/uL 4.8   5.6   5.5    Hemoglobin 13.0 - 17.0 g/dL 12.4   13.4   13.7    Hematocrit 39.0 - 52.0 % 37.8   41.2   43.6    Platelets 150 - 400 K/uL 167   188   211          Latest Ref Rng & Units 01/27/2022   12:45 PM 01/13/2022    2:10 PM 01/02/2022    3:04 PM  CMP  Glucose 70 - 99 mg/dL 135   104   106    BUN 8 - 23 mg/dL _0 Creatinine 0.61 - 1.24 mg/dL 1.18   1.19   1.20    Sodium 135 - 145 mmol/L 138   139   141    Potassium 3.5 - 5.1 mmol/L 4.3   4.8   4.8    Chloride 98 - 111 mmol/L 107   106   108    CO2 22 - 32 mmol/L _1 Calcium 8.9 - 10.3 mg/dL 9.3   9.9   10.1    Total Protein 6.5 - 8.1 g/dL 6.9   7.3   7.5    Total Bilirubin 0.3 - 1.2 mg/dL 0.8   1.1   1.0    Alkaline Phos 38 - 126 U/L 43   46   54    AST 15 - 41 U/L _2 ALT 0 - 44 U/L <5  C 8   <5      C Corrected result      RADIOGRAPHIC STUDIES: I have personally reviewed the radiological images as listed and agreed with the findings in the report. No results found.    No orders  of the defined types were placed in this encounter.  All questions were answered. The patient knows to call the clinic with any problems, questions or concerns. No barriers to learning was detected. The total time spent in the appointment was 30 minutes.     Truitt Merle, MD 01/27/2022   I, Wilburn Mylar, am acting as scribe for Truitt Merle, MD.   I have reviewed the above documentation for accuracy and completeness, and I agree with the above.

## 2022-01-27 NOTE — Progress Notes (Signed)
Sent order for port placement to Dr. Daneil Dan office to have them schedule prior to chemo start of 02/10/2022.  Faxed Port order to Dr. Wilburn Cornelia Allen's office.

## 2022-01-28 DIAGNOSIS — L814 Other melanin hyperpigmentation: Secondary | ICD-10-CM | POA: Diagnosis not present

## 2022-01-28 DIAGNOSIS — L219 Seborrheic dermatitis, unspecified: Secondary | ICD-10-CM | POA: Diagnosis not present

## 2022-01-28 DIAGNOSIS — L853 Xerosis cutis: Secondary | ICD-10-CM | POA: Diagnosis not present

## 2022-01-28 DIAGNOSIS — C44311 Basal cell carcinoma of skin of nose: Secondary | ICD-10-CM | POA: Diagnosis not present

## 2022-01-28 DIAGNOSIS — D229 Melanocytic nevi, unspecified: Secondary | ICD-10-CM | POA: Diagnosis not present

## 2022-01-28 LAB — CANCER ANTIGEN 19-9: CA 19-9: 568 U/mL — ABNORMAL HIGH (ref 0–35)

## 2022-01-29 DIAGNOSIS — C44311 Basal cell carcinoma of skin of nose: Secondary | ICD-10-CM | POA: Diagnosis not present

## 2022-01-30 ENCOUNTER — Encounter (HOSPITAL_COMMUNITY)
Admission: RE | Admit: 2022-01-30 | Discharge: 2022-01-30 | Disposition: A | Discharge status: Home / Self Care | Payer: Medicare Other | Source: Ambulatory Visit | Attending: Surgery | Admitting: Surgery

## 2022-01-30 ENCOUNTER — Encounter (HOSPITAL_COMMUNITY): Payer: Self-pay | Admitting: Surgery

## 2022-01-30 ENCOUNTER — Ambulatory Visit: Payer: Self-pay | Admitting: Surgery

## 2022-01-30 ENCOUNTER — Other Ambulatory Visit: Payer: Self-pay

## 2022-01-30 NOTE — Progress Notes (Signed)
Pt. Needs instructions for upcoming procedure.

## 2022-01-30 NOTE — Progress Notes (Signed)
For Short Stay: [name] appointment date: Date of COVID positive in last 71 days:  Bowel Prep reminder:   For Anesthesia: PCP - Dr. [name] Cardiologist - DO: Sunit Tolia  Chest x-ray -  EKG - 12/23/21 Stress Test -  ECHO - 08/17/19 Cardiac Cath -  Pacemaker/ICD device last checked: Pacemaker orders received: Device Rep notified:  Spinal Cord Stimulator:  Sleep Study - Yes CPAP - Yes  Fasting Blood Sugar -  Checks Blood Sugar _____ times a day Date and result of last Hgb A1c-  Blood Thinner Instructions: Pt. Does not received instructions yet from the surgeon.RN advised pt. To call cardiologist and ask for recommendations.Also pt. Was informed that the recommendations are to hold eliquis at least 72 hours before procedure. Aspirin Instructions: Last Dose:  Activity level: Can go up a flight of stairs and activities of daily living without stopping and without chest pain and/or shortness of breath   Able to exercise without chest pain and/or shortness of breath   Unable to go up a flight of stairs without chest pain and/or shortness of breath    Hx: Aflutter,OSA(BiPap) Anesthesia review:   Patient denies shortness of breath, fever, cough and chest pain at PAT appointment   Patient verbalized understanding of instructions that were given to them at the PAT appointment. Patient was also instructed that they will need to review over the PAT instructions again at home before surgery.

## 2022-01-30 NOTE — Patient Instructions (Signed)
DUE TO COVID-19 ONLY TWO VISITORS  (aged 81 and older)  ARE ALLOWED TO COME WITH YOU AND STAY IN THE WAITING ROOM ONLY DURING PRE OP AND PROCEDURE.   **NO VISITORS ARE ALLOWED IN THE SHORT STAY AREA OR RECOVERY ROOM!!**  IF YOU WILL BE ADMITTED INTO THE HOSPITAL YOU ARE ALLOWED ONLY FOUR SUPPORT PEOPLE DURING VISITATION HOURS ONLY (7 AM -8PM)   The support person(s) must pass our screening, gel in and out, and wear a mask at all times, including in the patient's room. Patients must also wear a mask when staff or their support person are in the room. Visitors GUEST BADGE MUST BE WORN VISIBLY  One adult visitor may remain with you overnight and MUST be in the room by 8 P.M.     Your procedure is scheduled on: 02/05/22   Report to [facility] Main Entrance    Report to admitting at : 8:15 AM   Call this number if you have problems the morning of surgery [phone]   Do not eat food :After Midnight.   After Midnight you may have the following liquids until : 7:30  AM DAY OF SURGERY  Water Black Coffee (sugar ok, NO MILK/CREAM OR CREAMERS)  Tea (sugar ok, NO MILK/CREAM OR CREAMERS) regular and decaf                             Plain Jell-O (NO RED)                                           Fruit ices (not with fruit pulp, NO RED)                                     Popsicles (NO RED)                                                                  Juice: apple, WHITE grape, WHITE cranberry Sports drinks like Gatorade (NO RED) Clear broth(vegetable,chicken,beef)   Oral Hygiene is also important to reduce your risk of infection.                                    Remember - BRUSH YOUR TEETH THE MORNING OF SURGERY WITH YOUR REGULAR TOOTHPASTE   Do NOT smoke after Midnight   Take these medicines the morning of surgery with A SIP OF WATER: diltiazem,omeprazole.Tylenol as needed.  DO NOT TAKE ANY ORAL DIABETIC MEDICATIONS DAY OF YOUR SURGERY  Bring CPAP mask and tubing day of  surgery.                              You may not have any metal on your body including hair pins, jewelry, and body piercing             Do not wear lotions, powders, perfumes/cologne, or deodorant  Men may shave face and neck.   Do not bring valuables to the hospital. [place].   Contacts, dentures or bridgework may not be worn into surgery.   Bring small overnight bag day of surgery.    Patients discharged on the day of surgery will not be allowed to drive home.  Someone NEEDS to stay with you for the first 24 hours after anesthesia.   Special Instructions: Bring a copy of your healthcare power of attorney and living will documents         the day of surgery if you haven't scanned them before.              Please read over the following fact sheets you were given: IF YOU HAVE QUESTIONS ABOUT YOUR PRE-OP INSTRUCTIONS PLEASE CALL [phone]     [facility] - Preparing for Surgery Before surgery, you can play an important role.  Because skin is not sterile, your skin needs to be as free of germs as possible.  You can reduce the number of germs on your skin by washing with CHG (chlorahexidine gluconate) soap before surgery.  CHG is an antiseptic cleaner which kills germs and bonds with the skin to continue killing germs even after washing. Please DO NOT use if you have an allergy to CHG or antibacterial soaps.  If your skin becomes reddened/irritated stop using the CHG and inform your nurse when you arrive at Short Stay. Do not shave (including legs and underarms) for at least 48 hours prior to the first CHG shower.  You may shave your face/neck. Please follow these instructions carefully:  1.  Shower with CHG Soap the night before surgery and the  morning of Surgery.  2.  If you choose to wash your hair, wash your hair first as usual with your  normal  shampoo.  3.  After you shampoo, rinse your hair and body thoroughly to  remove the  shampoo.                           4.  Use CHG as you would any other liquid soap.  You can apply chg directly  to the skin and wash                       Gently with a scrungie or clean washcloth.  5.  Apply the CHG Soap to your body ONLY FROM THE NECK DOWN.   Do not use on face/ open                           Wound or open sores. Avoid contact with eyes, ears mouth and genitals (private parts).                       Wash face,  Genitals (private parts) with your normal soap.             6.  Wash thoroughly, paying special attention to the area where your surgery  will be performed.  7.  Thoroughly rinse your body with warm water from the neck down.  8.  DO NOT shower/wash with your normal soap after using and rinsing off  the CHG Soap.  9.  Pat yourself dry with a clean towel.            10.  Wear clean pajamas.            11.  Place clean sheets on your bed the night of your first shower and do not  sleep with pets. Day of Surgery : Do not apply any lotions/deodorants the morning of surgery.  Please wear clean clothes to the hospital/surgery center.  FAILURE TO FOLLOW THESE INSTRUCTIONS MAY RESULT IN THE CANCELLATION OF YOUR SURGERY PATIENT SIGNATURE_________________________________  NURSE SIGNATURE__________________________________  ________________________________________________________________________

## 2022-02-04 ENCOUNTER — Telehealth: Payer: Self-pay

## 2022-02-04 NOTE — Progress Notes (Signed)
[place] OFFICE PROGRESS NOTE  [name] Cha, MD 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 Sclerotic 1.5 cm left ischial lesion/ Sclerotic medial left iliac bone 1.5 cm lesion (series 2/image 47), increased from 1.0 cm. Gleason score of 4+5= 9; with almost all cores involved greater than 80%.  9/16 Lupron 69-monthdepot on 9/16. [Urology; Dr.Siniski]  # MID OCT 2020- Zytiga 1000 mg+ prednisone; stopped December 2021 [poor tolerance if with RVR]; DISCONTINUED.   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       Prostate cancer metastatic to bone ([facility]  05/24/2019 Initial Diagnosis   Prostate cancer metastatic to bone ([facility]   Pancreatic cancer ([facility]  10/02/2021 Imaging   CT ABDOMEN PELVIS W CONTRAST   IMPRESSION: 1. There is new pancreatic ductal dilation with an indeterminate 19 mm hypodense low-density mass area in the head/uncinate process of the pancreas. Recommend further evaluation with dedicated MRI/MRCP with contrast. 2. No evidence of bowel obstruction.  Moderate rectal stool ball. 3. Scattered peripherally located clustered pulmonary nodules in the RIGHT middle lobe. Findings are likely infectious or inflammatory in etiology. Given history of malignancy, recommend follow-up as per clinical protocol.   10/28/2021 Imaging   MR ABDOMEN MRCP W WO CONTAST   IMPRESSION: 1. Examination is significantly limited by breath motion artifact throughout. 2. The main pancreatic duct is diffusely dilated from the level of the superior pancreatic head, measuring up to 0.7 cm. 3. In the inferior pancreatic head and uncinate,  there is a multilobulated, fluid signal cystic lesion measuring 1.8 x 1.0 cm. Due to breath motion artifact it is difficult to determine whether this communicates to the adjacent duct. There are multiple additional subcentimeter cystic lesions scattered throughout the pancreas, several of which clearly communicate to the main pancreatic duct. 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 Recommend EUS/FNA for further diagnosis given the presence of pancreatic ductal dilatation. 4. Status post left nephrectomy. 5. No evidence of recurrent or metastatic disease in the abdomen.   12/25/2021 Procedure   UPPER ENDOSCOPIC ULTRASOUND-By Dr. MRush Landmark - A mass-like region was identified in the pancreatic head where the pancreatic duct dilates with multiple cystic regions noted throughout the pancreas as well. 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 However, the endosonographic appearance is suspicious for potential adenocarcinoma. This was staged T2 N0 Mx by endosonographic criteria. T - No malignant-appearing lymph nodes were visualized in the celiac region (level 20), peripancreatic region and porta hepatis region.   12/25/2021 Pathology Results   CYTOLOGY - NON PAP  CASE: [id number]   FINAL MICROSCOPIC DIAGNOSIS:  A. PANCREAS, HEAD, FINE NEEDLE ASPIRATION:  - Malignant cells consistent with adenocarcinoma     01/01/2022 Initial Diagnosis   Pancreatic cancer ([facility]   01/13/2022 -  Chemotherapy   Patient is on Treatment Plan : PANCREAS Gemcitabine D1,15 q28d x 4 Cycles      01/26/2022 Cancer Staging   Staging form: Exocrine Pancreas, AJCC 8th Edition - Clinical: Stage IB (cT2, cN0, cM0) - Signed by FTruitt Merle MD on 01/26/2022 Total positive nodes: 0      CURRENT THERAPY: Gemcitabine  alone, q2weeks, started 01/13/22. Status post 2 treatments.   INTERVAL HISTORY: [name] [name] 81 y.o. male returns to the clinic  today for a follow up visit accompanied by his daughter. The patient was recently diagnosed with pancreatic cancer.  He is not a good surgical canidate.  Dr. Burr Medico recommended dose reduced gemcitabine 800 mg per metered squared every other week for tolerability. He is status post his first cycle of treatment and tolerated it well without any appreciable adverse side effects.  The patient had a Port-A-Cath placed on 02/05/2022.  He tolerated this procedure well but developed erythema and discomfort at the Port-A-Cath site.  He was seen in urgent care on Sunday, 02/08/2022 he was given a prescription for doxycycline for Port-A-Cath infection.  He has taken a total of 4 doses of doxycycline.   Today, the patient has worsening erythema, swelling, and sensitivity/pain at the Port-A-Cath site.  He denies any heat or drainage.  He denies any systemic symptoms such as fevers, chills, or night sweats.  He is supposed to see his surgeon's office on Friday 02/13/2022 regarding the infection and possible removal.   Besides this, he has no new concerns today.  He reports he has a good appetite and gained 2 pounds.  He continues to have intermittent abdominal pain for which Tylenol takes his pain level down to a 1/2 out of 10.  The patient denies any dark urine, jaundice, light stools, chest pain, or hemoptysis.  He sometimes has shortness of breath with bending over but nothing unusual for him. He occasionally has a mild cough/"throat irritation" for which he will use a cough drop.  Once again, this is not new to him.  Denies any nausea, vomiting, diarrhea, or constipation.  He is here for evaluation and repeat blood work before starting cycle #3.    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  MEDICATIONS:  Current Outpatient Medications  Medication Sig Dispense Refill   acetaminophen (TYLENOL) 325 MG tablet Take 325 mg by mouth every 6 (six) hours as needed for moderate pain.     apixaban (ELIQUIS) 5 MG TABS tablet Take 1 tablet (5 mg total) by mouth 2 (two) times daily. 60 tablet 1   carbidopa-levodopa (SINEMET CR) 50-200 MG tablet Take 1 tablet by mouth at bedtime. 90 tablet 0   carbidopa-levodopa (SINEMET IR) 25-100 MG tablet 3 tablets at 7 AM/2 at 11 AM/2 tablets at 3 PM/2 tablet at 7 PM 810 tablet 3   clonazePAM (KLONOPIN) 0.5 MG tablet Take 1 tablet by mouth at bedtime.     diltiazem (CARDIZEM) 30 MG tablet Take 1 tablet (30 mg total) by mouth 2 (two) times daily. 180 tablet 1   Glycerin-Hypromellose-PEG 400 (VISINE DRY EYE OP) Place 1 drop into both eyes 2 (two) times daily as needed (eye irritation).      ketoconazole (NIZORAL) 2 % shampoo Apply 1 application topically once a week.      leuprolide, 6 Month, (ELIGARD) 45 MG injection Inject 45 mg  into the skin every 6 (six) months.     omeprazole (PRILOSEC) 20 MG capsule 1 capsule 30 minutes before morning meal     ondansetron (ZOFRAN) 8 MG tablet Take 1 tablet (8 mg total) by mouth 2 (two) times daily as needed (Nausea or vomiting). 30 tablet 1   prochlorperazine (COMPAZINE) 10 MG tablet Take 1 tablet (10 mg total) by mouth every 6 (six) hours as needed (Nausea or vomiting). 30 tablet 1   rosuvastatin (CRESTOR) 10 MG tablet Take 1 tablet (10 mg total) by mouth daily. 90 tablet 3   triamcinolone cream (KENALOG) 0.1 % Apply 1 application topically 2 (two) times daily. (Patient taking differently: Apply 1 application. topically 2 (two) times daily as needed (irritation).) 453.6 g 1   zoledronic acid (RECLAST) 5 MG/100ML SOLN injection Inject 5 mg into the  vein once.     No current facility-administered medications for this visit.    SURGICAL HISTORY:  Past Surgical History:  Procedure Laterality Date   APPENDECTOMY  1963   BIOPSY  12/25/2021   Procedure: BIOPSY;  Surgeon: Irving Copas., MD;  Location: [place];  Service: Gastroenterology;;   COLONOSCOPY  2010, 2015   ESOPHAGOGASTRODUODENOSCOPY (EGD) WITH PROPOFOL N/A 12/25/2021   Procedure: ESOPHAGOGASTRODUODENOSCOPY (EGD) WITH PROPOFOL;  Surgeon: Irving Copas., MD;  Location: [place];  Service: Gastroenterology;  Laterality: N/A;   EUS N/A 12/25/2021   Procedure: UPPER ENDOSCOPIC ULTRASOUND (EUS) RADIAL;  Surgeon: Irving Copas., MD;  Location: [place];  Service: Gastroenterology;  Laterality: N/A;   FINE NEEDLE ASPIRATION  12/25/2021   Procedure: FINE NEEDLE ASPIRATION (FNA) LINEAR;  Surgeon: Irving Copas., MD;  [facility] ENDOSCOPY;  Service: Gastroenterology;;   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  Negative for appetite change, chills, fever and unexpected weight change.  HENT: Negative for mouth sores, nosebleeds, sore throat and trouble swallowing.   Eyes: Negative for eye problems and icterus.  Respiratory: Positive for dyspnea with certain activities.  Negative for cough, hemoptysis, and wheezing.   Cardiovascular: Negative for chest pain and leg swelling.  Gastrointestinal: Positive for  intermittent mid abdominal discomfort and bloating (stable).  Negative for constipation, diarrhea, nausea and vomiting.  Genitourinary: Negative for bladder incontinence, difficulty urinating, dysuria, frequency and hematuria.   Musculoskeletal: Positive for chronic back pain. Negative for gait problem, neck pain and neck stiffness.  Skin: Positive for erythema and swelling over Port-A-Cath site. Neurological: Negative for dizziness, extremity weakness, gait problem, headaches, light-headedness and seizures.  Hematological: Negative for adenopathy. Does not bruise/bleed easily.  Psychiatric/Behavioral: Negative for confusion, depression and sleep disturbance. The patient is not nervous/anxious.     PHYSICAL EXAMINATION:  There were no vitals taken for this visit.  ECOG PERFORMANCE STATUS: 1-2  Physical Exam  Constitutional: Oriented to person, place, and time and thin appearing male and in no distress.  HENT:  Head: Normocephalic and atraumatic.  Mouth/Throat: Oropharynx is clear and moist. No oropharyngeal exudate.  Eyes:  Conjunctivae are normal. Right eye exhibits no discharge. Left eye exhibits no discharge. No scleral icterus.  Neck: Normal range of motion. Neck supple.  Cardiovascular: Bradycardic, regular rhythm, normal heart sounds and intact distal pulses.   Pulmonary/Chest: Effort normal and breath sounds normal. No respiratory distress. No wheezes. No rales.  Abdominal: Soft. Bowel sounds are normal. Exhibits no distension and no mass. There is no tenderness.  Musculoskeletal: Normal range of motion. Exhibits no edema.  Lymphadenopathy:    No cervical adenopathy.  Neurological: Alert and oriented to person, place, and time. Exhibits muscle wasting.  Coordination normal.  Positive for some shuffled gait.  Skin: Erythema and swelling over Port-A-Cath site.  The area is sensitive/tender to light palpation.   Psychiatric: Mood, memory and judgment normal.  Vitals  reviewed.  LABORATORY DATA: Lab Results  Component Value Date   WBC 4.8 01/27/2022   HGB 12.4 (L) 01/27/2022   HCT 37.8 (L) 01/27/2022   MCV 93.3 01/27/2022   PLT 167 01/27/2022      Chemistry      Component Value Date/Time   NA 138 01/27/2022 1245   NA 139 12/04/2021 1359   K 4.3 01/27/2022 1245   CL 107 01/27/2022 1245   CO2 27 01/27/2022 1245   BUN 22 01/27/2022 1245   BUN 20 12/04/2021 1359   CREATININE 1.18 01/27/2022 1245      Component Value Date/Time   CALCIUM 9.3 01/27/2022 1245   ALKPHOS 43 01/27/2022 1245   AST 25 01/27/2022 1245   ALT <5 01/27/2022 1245   BILITOT 0.8 01/27/2022 1245       RADIOGRAPHIC STUDIES:  CT Chest W Contrast  Result Date: 01/13/2022 CLINICAL DATA:  Pancreatic cancer, staging. * Tracking Code: BO * EXAM: CT CHEST WITH CONTRAST TECHNIQUE: Multidetector CT imaging of the chest was performed during intravenous contrast administration. RADIATION DOSE REDUCTION: This exam was performed according to the departmental dose-optimization program which includes automated exposure control, adjustment of the mA and/or kV according to patient size and/or use of iterative reconstruction technique. CONTRAST:  51m OMNIPAQUE IOHEXOL 300 MG/ML  SOLN COMPARISON:  MRI abdomen October 28, 2021 and CT abdomen pelvis October 02, 2021. FINDINGS: Cardiovascular: Aortic atherosclerosis without thoracic aortic aneurysm. No central pulmonary embolus on this nondedicated study. Normal size heart. No significant pericardial effusion/thickening. Coronary artery calcifications. Mediastinum/Nodes: No supraclavicular adenopathy. No discrete thyroid nodule. No pathologically enlarged mediastinal, hilar or axillary lymph nodes. Esophagus is grossly unremarkable. Lungs/Pleura: Mild biapical pleuroparenchymal scarring. Solid 2 mm right lower lobe pulmonary nodule on image 84/5. Tree in bud nodularity in the right middle lobe image 99/5 similar to CT October 02, 2021 most consistent  with an infectious or inflammatory process. No pleural effusion. No pneumothorax. Upper Abdomen: Hypodensity in the right hepatic lobe measuring 15 mm previously characterized as a benign cyst on MRI October 28, 2021. Pancreatic ductal dilation with hypodense pancreatic head lesion reflects patient's known pancreatic neoplasm. Musculoskeletal: Multilevel wedging compression deformities at T7, T8, L1 and L2 with prior augmentation at T8, L1 and L2. No aggressive lytic or blastic lesion of bone. IMPRESSION: 1. No convincing evidence of metastatic disease in the chest. However, there is a 2 mm right lower lobe pulmonary nodule which is favored to reflect sequela of an infectious or inflammatory process but metastatic disease is not technically excluded. Consider attention on short interval follow-up dedicated chest CT. 2. Right middle lobe tree-in-bud opacities are similar dating back to October 02, 2021 likely reflecting infectious or inflammatory  process. Attention on follow-up imaging. 3. Multilevel wedging compression deformities at T7, T8, L1 and L2 with prior augmentation at T8, L1 and L2. 4. Aortic Atherosclerosis (ICD10-I70.0). Electronically Signed   By: Dahlia Bailiff M.D.   On: 01/13/2022 13:31     ASSESSMENT/PLAN:  [name] [name] is a 81 y.o. male with    1. Pancreatic adenocarcinoma (T2, N0, Mx) -presented with abdominal discomfort. CT AP on 10/02/21, and MRI/MRCP on 10/28/21 showed 1.8 cm mass in pancreatic head and uncinate. 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 Staged at T2 N0. -baseline CA 19-9 from 01/02/22 was 730. -he met with Dr. Zenia Resides and is felt not to be a good surgical candidate given his multiple medical comorbidities. -staging chest CT showed no definite evidence of metastatic disease. -he began single agent gemcitabine on 01/13/22, to be given every 2 weeks. Plan is for 3-4, or up to 6,  months of treatment depending on tolerance. -labs reviewed, no neutropenia or leukocytosis on labs today mild anemia noted.  CMP is pending at this time.  -Given his Port-A-Cath infection, I would recommend holding treatment for this week.  We have reached out to his surgeon who is going to work him in to the clinic today for evaluation.  I do not want to give him chemotherapy this week in the event that he has his Port-A-Cath surgically removed.  Additionally, I would recommend that he complete his course of antibiotics. -I did caution the patient on symptoms that would warrant emergency room evaluation such as hypotension, tachycardia, fevers, chills, or night sweats. -I will reschedule his appointments for next week Thursday, 02/19/2022.  We will reassess him at that time and determine if he should proceed with cycle #3 at that time.  Since he did well with his first 2 cycles of treatment, I will revisit his dosing with Dr. Burr Medico who may wish to proceed with his gemcitabine 1000 mg per metered squared starting from his next cycle of treatment.  He tolerated 800 mg per metered squared of gemcitabine well without any concerning adverse side effects. -If he continues to tolerating well, Dr. Burr Medico may change his treatment to day 1, 8 every 21 days -Plan for chemo 3 to 6 months, followed by consolidation radiation.  2.  Port-A-Cath infection -Port-A-Cath placed on 02/05/2022 -On Sunday, 02/08/2022 the patient presented to an urgent care with pain and swelling at the Port-A-Cath site.  He was given a prescription for doxycycline.  He only completed 4 doses at this time. -We have reached out to Dr. Ayesha Rumpf office due to his worsening symptoms today.  She is going to work him in to the clinic today. -No leukocytosis on labs.  No unstable vitals today.  I did caution the patient on signs and symptoms that would warrant emergency room evaluation including fevers, chills, night sweats, hypotension, tachycardia, or  uncontrolled pain.   3. Metastatic prostate cancer  -initially diagnosed with Gleason 4+5 prostate cancer in 04/2019 for elevated PSA of 26.5. Staging CT AP on 05/16/19 showed two suspicious left pelvic lesions. He was started on Eligard by Dr. Diamantina Providence in urology; receiving q23month, next due ~7/26.  -Patient also receives Reclast once a year. -he did not tolerate Zytiga -Last PSA drawn on 10/13/21 showed good response at 0.06.    4. Abdominal pain -Continue to monitor for now.  Symptoms currently controlled with Tylenol as needed.  5. Family history of cancer  -despite having no family history of malignancy, he does qualify for genetic testing due to his pancreatic cancer diagnosis. -testing performed on 01/13/22; results are pending.   6. 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 Sees Dr. Carles Collet from neurology -Socially, the patient lives in independent living [place].  He performs his ADLs.  He has a walker if he walks far distances.  The patient's son lives 10 minutes away -The patient has a solitary right kidney.  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  Has some level of kidney disease.   Plan:  -Hold treatment for today.  Will reevaluate the patient on 02/19/2022 -Obtain labs today -Reach out to Dr. Ayesha Rumpf office who is going to work the patient in today regarding his Port-A-Cath infection.  Anticipate he may need his Port-A-Cath removed.  Because of this, he will not proceed with chemotherapy today.  No orders of the defined types were placed in this encounter.    The total time spent in the appointment was 50-55 minutes.   [name] Bolds L Pavlos Yon, PA-C 02/04/22

## 2022-02-04 NOTE — Telephone Encounter (Signed)
-----   Message from Timothy Lasso, RN sent at 01/12/2022 10:05 AM EDT ----- Regarding: FW: EUS Fiducial Placement  ----- Message ----- From: Milus Banister, MD Sent: 01/07/2022   8:23 AM EDT To: Kyung Rudd, MD, Timothy Lasso, RN, # Subject: RE: EUS Fiducial Placement                     Agree. Thanks   ----- Message ----- From: Irving Copas., MD Sent: 01/07/2022   8:09 AM EDT To: Milus Banister, MD, Kyung Rudd, MD, # Subject: EUS Fiducial Placement                         Kwamane Whack, When you return from being away this patient will need to be scheduled in approximately 2 to 3 months for an EUS with fiducial placement.  This can be done with myself or with DJ depending on availability and needs to be done at that time since he will get radiation after. If we do not have the schedule for July/August yet then just make sure we have him on to get done very soon at that time. Thanks. GM  FYI DJ and YF and JM

## 2022-02-04 NOTE — Anesthesia Preprocedure Evaluation (Signed)
Anesthesia Evaluation  Patient identified by MRN, date of birth, ID band Patient awake    Reviewed: Allergy & Precautions, NPO status , Patient's Chart, lab work & pertinent test results  Airway Mallampati: II  TM Distance: >3 FB Neck ROM: Full    Dental no notable dental hx. (+) Dental Advisory Given, Teeth Intact   Pulmonary sleep apnea and Continuous Positive Airway Pressure Ventilation , former smoker,    Pulmonary exam normal breath sounds clear to auscultation       Cardiovascular Exercise Tolerance: Good Normal cardiovascular exam+ dysrhythmias Atrial Fibrillation  Rhythm:Regular Rate:Normal     Neuro/Psych negative neurological ROS     GI/Hepatic Neg liver ROS, GERD  Medicated and Controlled,  Endo/Other  negative endocrine ROS  Renal/GU Lab Results      Component                Value               Date                      CREATININE               1.18                01/27/2022                K                        4.3                 01/27/2022                    Musculoskeletal negative musculoskeletal ROS (+)   Abdominal   Peds  Hematology Lab Results      Component                Value               Date                      WBC                      4.8                 01/27/2022                HGB                      12.4 (L)            01/27/2022                HCT                      37.8 (L)            01/27/2022                MCV                      93.3                01/27/2022                PLT  167                 01/27/2022              Anesthesia Other Findings   Reproductive/Obstetrics                            Anesthesia Physical Anesthesia Plan  ASA: 3  Anesthesia Plan: General   Post-op Pain Management:    Induction: Intravenous  PONV Risk Score and Plan: Treatment may vary due to age or medical condition  Airway  Management Planned: LMA  Additional Equipment:   Intra-op Plan:   Post-operative Plan:   Informed Consent: I have reviewed the patients History and Physical, chart, labs and discussed the procedure including the risks, benefits and alternatives for the proposed anesthesia with the patient or authorized representative who has indicated his/her understanding and acceptance.     Dental advisory given  Plan Discussed with: CRNA and Anesthesiologist  Anesthesia Plan Comments:        Anesthesia Quick Evaluation

## 2022-02-05 ENCOUNTER — Encounter (HOSPITAL_COMMUNITY): Payer: Self-pay | Admitting: Surgery

## 2022-02-05 ENCOUNTER — Other Ambulatory Visit: Payer: Self-pay

## 2022-02-05 ENCOUNTER — Ambulatory Visit (HOSPITAL_COMMUNITY): Payer: Medicare Other

## 2022-02-05 ENCOUNTER — Ambulatory Visit (HOSPITAL_COMMUNITY): Payer: Medicare Other | Admitting: Physician Assistant

## 2022-02-05 ENCOUNTER — Ambulatory Visit (HOSPITAL_COMMUNITY)
Admission: RE | Admit: 2022-02-05 | Discharge: 2022-02-05 | Disposition: A | Discharge status: Home / Self Care | Payer: Medicare Other | Attending: Surgery | Admitting: Surgery

## 2022-02-05 ENCOUNTER — Ambulatory Visit (HOSPITAL_BASED_OUTPATIENT_CLINIC_OR_DEPARTMENT_OTHER): Payer: Medicare Other | Admitting: Certified Registered Nurse Anesthetist

## 2022-02-05 ENCOUNTER — Encounter (HOSPITAL_COMMUNITY)
Admission: RE | Disposition: A | Discharge status: Home / Self Care | Payer: Self-pay | Source: Home / Self Care | Attending: Surgery

## 2022-02-05 DIAGNOSIS — Z87891 Personal history of nicotine dependence: Secondary | ICD-10-CM | POA: Insufficient documentation

## 2022-02-05 DIAGNOSIS — I4891 Unspecified atrial fibrillation: Secondary | ICD-10-CM | POA: Insufficient documentation

## 2022-02-05 DIAGNOSIS — G4733 Obstructive sleep apnea (adult) (pediatric): Secondary | ICD-10-CM | POA: Diagnosis not present

## 2022-02-05 DIAGNOSIS — G4730 Sleep apnea, unspecified: Secondary | ICD-10-CM | POA: Insufficient documentation

## 2022-02-05 DIAGNOSIS — C61 Malignant neoplasm of prostate: Secondary | ICD-10-CM

## 2022-02-05 DIAGNOSIS — Z79899 Other long term (current) drug therapy: Secondary | ICD-10-CM | POA: Diagnosis not present

## 2022-02-05 DIAGNOSIS — C250 Malignant neoplasm of head of pancreas: Secondary | ICD-10-CM | POA: Diagnosis not present

## 2022-02-05 DIAGNOSIS — K219 Gastro-esophageal reflux disease without esophagitis: Secondary | ICD-10-CM | POA: Insufficient documentation

## 2022-02-05 DIAGNOSIS — Z9989 Dependence on other enabling machines and devices: Secondary | ICD-10-CM | POA: Diagnosis not present

## 2022-02-05 DIAGNOSIS — Z452 Encounter for adjustment and management of vascular access device: Secondary | ICD-10-CM | POA: Diagnosis not present

## 2022-02-05 DIAGNOSIS — K862 Cyst of pancreas: Secondary | ICD-10-CM

## 2022-02-05 DIAGNOSIS — C801 Malignant (primary) neoplasm, unspecified: Secondary | ICD-10-CM

## 2022-02-05 DIAGNOSIS — C259 Malignant neoplasm of pancreas, unspecified: Secondary | ICD-10-CM | POA: Diagnosis not present

## 2022-02-05 HISTORY — PX: PORTACATH PLACEMENT: SHX2246

## 2022-02-05 HISTORY — DX: Cardiac arrhythmia, unspecified: I49.9

## 2022-02-05 SURGERY — INSERTION, TUNNELED CENTRAL VENOUS DEVICE, WITH PORT
Anesthesia: General

## 2022-02-05 MED ORDER — LACTATED RINGERS IV SOLN: INTRAVENOUS | Status: DC

## 2022-02-05 MED ORDER — ACETAMINOPHEN 10 MG/ML IV SOLN
1000.0000 mg | Freq: Once | INTRAVENOUS | Status: DC | PRN
  Administered 2022-02-05: 1000 mg via INTRAVENOUS

## 2022-02-05 MED ORDER — ORAL CARE MOUTH RINSE: 15.0000 mL | Freq: Once | OROMUCOSAL | Status: AC

## 2022-02-05 MED ORDER — LIDOCAINE-EPINEPHRINE 1 %-1:100000 IJ SOLN
INTRAMUSCULAR | Status: AC
  Filled 2022-02-05: qty 1

## 2022-02-05 MED ORDER — PROPOFOL 10 MG/ML IV BOLUS
INTRAVENOUS | Status: DC | PRN
  Administered 2022-02-05: 150 mg via INTRAVENOUS

## 2022-02-05 MED ORDER — FENTANYL CITRATE PF 50 MCG/ML IJ SOSY
25.0000 ug | PREFILLED_SYRINGE | INTRAMUSCULAR | Status: DC | PRN
  Administered 2022-02-05: 25 ug via INTRAVENOUS

## 2022-02-05 MED ORDER — FENTANYL CITRATE (PF) 100 MCG/2ML IJ SOLN
INTRAMUSCULAR | Status: DC | PRN
  Administered 2022-02-05: 25 ug via INTRAVENOUS
  Administered 2022-02-05: 50 ug via INTRAVENOUS
  Administered 2022-02-05: 25 ug via INTRAVENOUS

## 2022-02-05 MED ORDER — BUPIVACAINE-EPINEPHRINE 0.25% -1:200000 IJ SOLN
INTRAMUSCULAR | Status: DC | PRN
  Administered 2022-02-05: 10 mL

## 2022-02-05 MED ORDER — PROPOFOL 10 MG/ML IV BOLUS
INTRAVENOUS | Status: AC
  Filled 2022-02-05: qty 20

## 2022-02-05 MED ORDER — HEPARIN SOD (PORK) LOCK FLUSH 100 UNIT/ML IV SOLN
INTRAVENOUS | Status: DC | PRN
  Administered 2022-02-05: 500 [IU] via INTRAVENOUS

## 2022-02-05 MED ORDER — ONDANSETRON HCL 4 MG/2ML IJ SOLN
INTRAMUSCULAR | Status: AC
  Filled 2022-02-05: qty 2

## 2022-02-05 MED ORDER — ONDANSETRON HCL 4 MG/2ML IJ SOLN
INTRAMUSCULAR | Status: DC | PRN
  Administered 2022-02-05: 4 mg via INTRAVENOUS

## 2022-02-05 MED ORDER — SODIUM CHLORIDE 0.9 % IR SOLN
Status: DC | PRN
  Administered 2022-02-05: 1000 mL

## 2022-02-05 MED ORDER — LIDOCAINE-EPINEPHRINE (PF) 1 %-1:200000 IJ SOLN
INTRAMUSCULAR | Status: DC | PRN
  Administered 2022-02-05: 10 mL

## 2022-02-05 MED ORDER — ACETAMINOPHEN 10 MG/ML IV SOLN
INTRAVENOUS | Status: AC
  Filled 2022-02-05: qty 100

## 2022-02-05 MED ORDER — FENTANYL CITRATE (PF) 100 MCG/2ML IJ SOLN
INTRAMUSCULAR | Status: AC
  Filled 2022-02-05: qty 2

## 2022-02-05 MED ORDER — BUPIVACAINE HCL 0.25 % IJ SOLN
INTRAMUSCULAR | Status: AC
  Filled 2022-02-05: qty 1

## 2022-02-05 MED ORDER — HEPARIN SOD (PORK) LOCK FLUSH 100 UNIT/ML IV SOLN
INTRAVENOUS | Status: AC
  Filled 2022-02-05: qty 5

## 2022-02-05 MED ORDER — FENTANYL CITRATE PF 50 MCG/ML IJ SOSY
PREFILLED_SYRINGE | INTRAMUSCULAR | Status: AC
  Administered 2022-02-05: 25 ug via INTRAVENOUS
  Filled 2022-02-05: qty 1

## 2022-02-05 MED ORDER — HEPARIN 6000 UNIT IRRIGATION SOLUTION
Freq: Once | Status: DC
  Filled 2022-02-05: qty 500

## 2022-02-05 MED ORDER — CEFAZOLIN SODIUM-DEXTROSE 2-4 GM/100ML-% IV SOLN
2.0000 g | INTRAVENOUS | Status: AC
  Administered 2022-02-05: 2 g via INTRAVENOUS
  Filled 2022-02-05: qty 100

## 2022-02-05 MED ORDER — HEPARIN 6000 UNIT IRRIGATION SOLUTION
Status: DC | PRN
  Administered 2022-02-05: 1

## 2022-02-05 MED ORDER — EPHEDRINE 5 MG/ML INJ
INTRAVENOUS | Status: AC
  Filled 2022-02-05: qty 5

## 2022-02-05 MED ORDER — CHLORHEXIDINE GLUCONATE 0.12 % MT SOLN
15.0000 mL | Freq: Once | OROMUCOSAL | Status: AC
  Administered 2022-02-05: 15 mL via OROMUCOSAL

## 2022-02-05 MED ORDER — LIDOCAINE 2% (20 MG/ML) 5 ML SYRINGE
INTRAMUSCULAR | Status: DC | PRN
  Administered 2022-02-05: 80 mg via INTRAVENOUS

## 2022-02-05 MED ORDER — ONDANSETRON HCL 4 MG/2ML IJ SOLN: 4.0000 mg | Freq: Once | INTRAMUSCULAR | Status: DC | PRN

## 2022-02-05 MED ORDER — EPHEDRINE SULFATE-NACL 50-0.9 MG/10ML-% IV SOSY
PREFILLED_SYRINGE | INTRAVENOUS | Status: DC | PRN
  Administered 2022-02-05: 5 mg via INTRAVENOUS

## 2022-02-05 MED ORDER — LIDOCAINE HCL (PF) 2 % IJ SOLN
INTRAMUSCULAR | Status: AC
  Filled 2022-02-05: qty 5

## 2022-02-05 SURGICAL SUPPLY — 45 items
BAG COUNTER SPONGE SURGICOUNT (BAG) ×1 IMPLANT
BAG DECANTER FOR FLEXI CONT (MISCELLANEOUS) ×2 IMPLANT
BENZOIN TINCTURE PRP APPL 2/3 (GAUZE/BANDAGES/DRESSINGS) IMPLANT
BLADE SURG 15 STRL LF DISP TIS (BLADE) ×1 IMPLANT
BLADE SURG 15 STRL SS (BLADE) ×2
BLADE SURG SZ11 CARB STEEL (BLADE) ×2 IMPLANT
CHLORAPREP W/TINT 26 (MISCELLANEOUS) ×2 IMPLANT
CLSR STERI-STRIP ANTIMIC 1/2X4 (GAUZE/BANDAGES/DRESSINGS) IMPLANT
COVER SURGICAL LIGHT HANDLE (MISCELLANEOUS) ×2 IMPLANT
DERMABOND ADVANCED (GAUZE/BANDAGES/DRESSINGS) ×1
DERMABOND ADVANCED .7 DNX12 (GAUZE/BANDAGES/DRESSINGS) IMPLANT
DRAPE C-ARM 42X120 X-RAY (DRAPES) ×2 IMPLANT
DRAPE LAPAROSCOPIC ABDOMINAL (DRAPES) ×2 IMPLANT
DRAPE UTILITY 15X26 TOWEL STRL (DRAPES) ×2 IMPLANT
ELECT REM PT RETURN 15FT ADLT (MISCELLANEOUS) ×2 IMPLANT
GAUZE 4X4 16PLY ~~LOC~~+RFID DBL (SPONGE) ×2 IMPLANT
GAUZE SPONGE 4X4 12PLY STRL (GAUZE/BANDAGES/DRESSINGS) ×2 IMPLANT
GLOVE BIOGEL PI IND STRL 7.0 (GLOVE) ×1 IMPLANT
GLOVE BIOGEL PI INDICATOR 7.0 (GLOVE) ×1
GLOVE BIOGEL PI MICRO 5.5 (GLOVE) ×2
GLOVE BIOGEL PI MICRO STRL 5.5 (GLOVE) ×2 IMPLANT
GLOVE SS PI  5.5 STRL (GLOVE) ×2
GLOVE SS PI 5.5 STRL (GLOVE) ×1 IMPLANT
GLOVE SURG POLYISO LF SZ6 (GLOVE) ×2 IMPLANT
GLOVE SURG SS PI 7.0 STRL IVOR (GLOVE) ×2 IMPLANT
GOWN STRL REUS W/ TWL LRG LVL3 (GOWN DISPOSABLE) ×1 IMPLANT
GOWN STRL REUS W/TWL LRG LVL3 (GOWN DISPOSABLE) ×2
KIT BASIN OR (CUSTOM PROCEDURE TRAY) ×2 IMPLANT
KIT PORT POWER 8FR ISP CVUE (Port) ×2 IMPLANT
KIT TURNOVER KIT A (KITS) ×1 IMPLANT
NEEDLE HYPO 22GX1.5 SAFETY (NEEDLE) ×2 IMPLANT
PACK BASIC VI WITH GOWN DISP (CUSTOM PROCEDURE TRAY) ×2 IMPLANT
PENCIL SMOKE EVACUATOR (MISCELLANEOUS) ×2 IMPLANT
SPIKE FLUID TRANSFER (MISCELLANEOUS) ×2 IMPLANT
SUT MNCRL AB 4-0 PS2 18 (SUTURE) ×2 IMPLANT
SUT PROLENE 2 0 SH DA (SUTURE) ×4 IMPLANT
SUT VIC AB 2-0 SH 18 (SUTURE) IMPLANT
SUT VIC AB 2-0 SH 27 (SUTURE)
SUT VIC AB 2-0 SH 27X BRD (SUTURE) IMPLANT
SUT VIC AB 3-0 SH 27 (SUTURE) ×2
SUT VIC AB 3-0 SH 27X BRD (SUTURE) ×1 IMPLANT
SYR 10ML LL (SYRINGE) ×2 IMPLANT
SYR 20ML LL LF (SYRINGE) ×2 IMPLANT
TOWEL OR 17X26 10 PK STRL BLUE (TOWEL DISPOSABLE) ×2 IMPLANT
TOWEL OR NON WOVEN STRL DISP B (DISPOSABLE) ×2 IMPLANT

## 2022-02-05 NOTE — Discharge Instructions (Addendum)
SURGERY DISCHARGE INSTRUCTIONS: PORT-A-CATH PLACEMENT  Activity You may resume your usual activities as tolerated Ok to shower in 24 hours, but do not bathe or submerge incision underwater for 2 weeks.  Wound Care Your incision is covered with skin glue called Dermabond. This will peel off on its own over time. You may shower and allow warm soapy water to run over your incisions. Gently pat dry. Do not submerge your incision underwater. Monitor your incision for any new redness, tenderness, or drainage. You may start using your port in 48 hours. Do not apply EMLA cream directly over the Dermabond (skin glue).  When to Call us: Fever greater than 100.5 New redness, drainage, or swelling at incision site Severe pain, nausea, or vomiting Shortness of breath, difficulty breathing  For questions or concerns, please call the [facility] Surgery office at [phone].  You may resume taking your Eliquis in 24 hours after surgery.

## 2022-02-05 NOTE — H&P (Signed)
[name] [name] is an 81 y.o. male.   Chief Complaint: Pancreatic cancer HPI: [name] [name] is an 81 yo male who a few months ago was found to have a cyst in the head of the pancreas with main duct dilation, consistent with a main duct IPMN. His CA19-9 was elevated. He underwent an EUS with FNA on 4/20, which confirmed adenocarcinoma. He is frail with multiple medical comorbidities and after discussions the decision has been made not to proceed with surgery. He has started chemotherapy via peripheral access and has now decided to proceed with port placement. He stopped taking his Eliquis 3 days ago in preparation for surgery.  Past Medical History:  Diagnosis Date   Atrial flutter (HCC)    BPH (benign prostatic hyperplasia)    Cancer ([place])    PROSTATE   Diverticulosis 2015   Dysrhythmia    Parkinson's disease ([place])    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 ([place])    Prostate cancer metastatic to bone ([place])    REM sleep behavior disorder    Sleep apnea    BiPap    Past Surgical History:  Procedure Laterality Date   APPENDECTOMY  1963   BIOPSY  12/25/2021   Procedure: BIOPSY;  Surgeon: Irving Copas., MD;  Location: [place];  Service: Gastroenterology;;   COLONOSCOPY  2010, 2015   ESOPHAGOGASTRODUODENOSCOPY (EGD) WITH PROPOFOL N/A 12/25/2021   Procedure: ESOPHAGOGASTRODUODENOSCOPY (EGD) WITH PROPOFOL;  Surgeon: Irving Copas., MD;  Location: [place];  Service: Gastroenterology;  Laterality: N/A;   EUS N/A 12/25/2021   Procedure: UPPER ENDOSCOPIC ULTRASOUND (EUS) RADIAL;  Surgeon: Irving Copas., MD;  Location: [place];  Service: Gastroenterology;  Laterality: N/A;   FINE NEEDLE ASPIRATION  12/25/2021   Procedure: FINE NEEDLE ASPIRATION (FNA) LINEAR;  Surgeon: Irving Copas., MD;  Location: [facility] ENDOSCOPY;  Service: Gastroenterology;;   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   KYPHOPLASTY N/A 08/15/2020   Procedure: L2  compression fracture;  Surgeon: Hessie Knows, MD;  Location: [facility];  Service: Orthopedics;  Laterality: N/A;   KYPHOPLASTY N/A 08/29/2020   Procedure: T8 KYPHOPLASTY;  Surgeon: Hessie Knows, MD;  Location: [facility];  Service: Orthopedics;  Laterality: N/A;   KYPHOPLASTY N/A 11/12/2020   Procedure: L1 KYPHOPLASTY;  Surgeon: Hessie Knows, MD;  Location: [facility];  Service: Orthopedics;  Laterality: N/A;    Family History  Problem Relation Age of Onset   Heart disease Maternal Grandfather    Diabetes Paternal Grandfather    Breast cancer Daughter    Social History:  reports that he quit smoking about 11 years ago. His smoking use included cigars. He has never used smokeless tobacco. He reports [name] alcohol use. He reports that he does not use drugs.  Allergies:  Allergies  Allergen Reactions   Influenza Vaccines Anaphylaxis    Flu shot: 1974 anaphylaxis. 2nd time swollen arm    Medications Prior to Admission  Medication Sig Dispense Refill   acetaminophen (TYLENOL) 325 MG tablet Take 325 mg by mouth every 6 (six) hours as needed for moderate pain.     carbidopa-levodopa (SINEMET CR) 50-200 MG tablet Take 1 tablet by mouth at bedtime. 90 tablet 0   carbidopa-levodopa (SINEMET IR) 25-100 MG tablet 3 tablets at 7 AM/2 at 11 AM/2 tablets at 3 PM/2 tablet at 7 PM 810 tablet 3   clonazePAM (KLONOPIN) 0.5 MG tablet Take 1 tablet by mouth at bedtime.     diltiazem (CARDIZEM) 30 MG tablet Take  1 tablet (30 mg total) by mouth 2 (two) times daily. 180 tablet 1   Glycerin-Hypromellose-PEG 400 (VISINE DRY EYE OP) Place 1 drop into both eyes 2 (two) times daily as needed (eye irritation).      ketoconazole (NIZORAL) 2 % shampoo Apply 1 application topically once a week.      rosuvastatin (CRESTOR) 10 MG tablet Take 1 tablet (10 mg total) by mouth daily. 90 tablet 3   triamcinolone cream (KENALOG) 0.1 % Apply 1 application topically 2 (two) times daily. (Patient taking differently: Apply 1  application. topically 2 (two) times daily as needed (irritation).) 453.6 g 1   apixaban (ELIQUIS) 5 MG TABS tablet Take 1 tablet (5 mg total) by mouth 2 (two) times daily. 60 tablet 1   leuprolide, 6 Month, (ELIGARD) 45 MG injection Inject 45 mg into the skin every 6 (six) months.     omeprazole (PRILOSEC) 20 MG capsule 1 capsule 30 minutes before morning meal     ondansetron (ZOFRAN) 8 MG tablet Take 1 tablet (8 mg total) by mouth 2 (two) times daily as needed (Nausea or vomiting). 30 tablet 1   prochlorperazine (COMPAZINE) 10 MG tablet Take 1 tablet (10 mg total) by mouth every 6 (six) hours as needed (Nausea or vomiting). 30 tablet 1   zoledronic acid (RECLAST) 5 MG/100ML SOLN injection Inject 5 mg into the vein once.      No results found for this or any previous visit (from the past 48 hour(s)). No results found.  Review of Systems  Blood pressure 117/73, pulse (!) 50, temperature (!) 97.4 F (36.3 C), temperature source Oral, resp. rate 16, height '5\' 6"'$  (1.676 m), weight 70.1 kg, SpO2 98 %. Physical Exam Vitals reviewed.  Constitutional:      General: He is not in acute distress.    Appearance: Normal appearance.  Eyes:     General: No scleral icterus.    Conjunctiva/sclera: Conjunctivae normal.  Pulmonary:     Effort: Pulmonary effort is normal. No respiratory distress.  Abdominal:     General: There is no distension.     Palpations: Abdomen is soft.     Tenderness: There is no abdominal tenderness.  Musculoskeletal:        General: Normal range of motion.  Skin:    General: Skin is warm and dry.     Coloration: Skin is not jaundiced.  Neurological:     General: No focal deficit present.     Mental Status: He is alert and oriented to person, place, and time.  Psychiatric:        Mood and Affect: Mood normal.        Behavior: Behavior normal.     Assessment/Plan 81 yo male with pancreatic adenocarcinoma, on chemotherapy. Proceed to OR for portacath insertion. Risks  and benefits of this procedure were reviewed with the patient, including the risks of bleeding, infection and pneumothorax. He agrees to proceed. Plan for CXR in PACU and discharge home. Ok to begin using port next week for chemotherapy.  Dwan Bolt, MD 02/05/2022, 7:02 AM

## 2022-02-05 NOTE — Transfer of Care (Signed)
Immediate Anesthesia Transfer of Care Note  Patient: [name] [name]  Procedure(s) Performed: INSERTION PORT-A-CATH  Patient Location: PACU  Anesthesia Type:General  Level of Consciousness: awake, alert  and oriented  Airway & Oxygen Therapy: Patient Spontanous Breathing and Patient connected to face mask oxygen  Post-op Assessment: Report given to RN  Post vital signs: Reviewed and stable  Last Vitals:  Vitals Value Taken Time  BP 102/60 02/05/22 0815  Temp    Pulse 63 02/05/22 0817  Resp 14 02/05/22 0817  SpO2 100 % 02/05/22 0817  Vitals shown include unvalidated device data.  Last Pain:  Vitals:   02/05/22 0553  TempSrc: Oral  PainSc:          Complications: No notable events documented.

## 2022-02-05 NOTE — Anesthesia Postprocedure Evaluation (Signed)
Anesthesia Post Note  Patient: [name] [name]  Procedure(s) Performed: INSERTION PORT-A-CATH     Patient location during evaluation: PACU Anesthesia Type: General Level of consciousness: awake and alert Pain management: pain level controlled Vital Signs Assessment: post-procedure vital signs reviewed and stable Respiratory status: spontaneous breathing, nonlabored ventilation, respiratory function stable and patient connected to nasal cannula oxygen Cardiovascular status: blood pressure returned to baseline and stable Postop Assessment: no apparent nausea or vomiting Anesthetic complications: no   No notable events documented.  Last Vitals:  Vitals:   02/05/22 0815 02/05/22 0830  BP: 102/60 (!) 105/53  Pulse: (!) 59 (!) 56  Resp: 18 18  Temp: 36.6 C   SpO2: 100% 95%    Last Pain:  Vitals:   02/05/22 0830  TempSrc:   PainSc: 4                  Barnet Glasgow

## 2022-02-05 NOTE — Anesthesia Procedure Notes (Signed)
Procedure Name: LMA Insertion Date/Time: 02/05/2022 7:24 AM Performed by: Maxwell Caul, CRNA Pre-anesthesia Checklist: Patient identified, Emergency Drugs available, Suction available and Patient being monitored Patient Re-evaluated:Patient Re-evaluated prior to induction Oxygen Delivery Method: Circle system utilized Preoxygenation: Pre-oxygenation with 100% oxygen Induction Type: IV induction LMA: LMA with gastric port inserted LMA Size: 4.0 Number of attempts: 1 Placement Confirmation: positive ETCO2 and breath sounds checked- equal and bilateral Tube secured with: Tape Dental Injury: Teeth and Oropharynx as per pre-operative assessment

## 2022-02-05 NOTE — Telephone Encounter (Signed)
EUS has been scheduled for 04/16/22 at [facility] with GM at 930 am   Left message on machine to call back

## 2022-02-05 NOTE — Op Note (Signed)
Date: 02/05/22  Patient: [name] [name] MRN: [id number]  Preoperative Diagnosis: Pancreatic adenocarcinoma Postoperative Diagnosis: Same  Procedure: Port-a-cath insertion  Surgeon: Michaelle Birks, MD  EBL: Minimal  Anesthesia: General LMA  Specimens: None  Indications: [name] [name] is an 81 yo male who was recently diagnosed with pancreatic adenocarcinoma. He has begun chemotherapy and elected for port placement.  Findings: Single-lumen 8-Fr power port placed via the right internal jugular vein under ultrasound and fluoroscopic guidance. Total catheter length of 23cm.  Procedure details: Informed consent was obtained in the preoperative area prior to the procedure. The patient was brought to the operating room and placed on the table in the supine position. General anesthesia was induced and appropriate lines and drains were placed for intraoperative monitoring. Perioperative antibiotics were administered per SCIP guidelines. The neck and chest were prepped and draped in the usual sterile fashion. A pre-procedure timeout was taken verifying patient identity, surgical site and procedure to be performed.  The patient was placed in Trendelenberg and attempts were made to access the right subclavian vein but this was not successful. The right internal jugular vein was accessed with a large-bore needle under ultrasound guidance. A guidewire was inserted and advanced, and position in the SVC was confirmed fluoroscopically. The needle was removed and the wire was clipped to the drapes to secure its position. Next a place for the port was chosen on the upper lateral right chest, a small skin incision was made and a subcutaneous pocket was created with cautery. The port and catheter were then flushed and brought onto the field. Three 2-0 prolene sutures were used to secure the port in the subcutaneous pocket to the pectoralis fascia, but the sutures were not tied down. A skin nick was made around the  wire exit site. The port was placed in the pocket and the attached cathether was tunneled beneath the skin to the wire exit site. The catheter was then measured using fluoro - it was placed over the skin adjacent to the guidewire, and marked externally at the cavoatrial junction. The catheter was then cut at this location, which was at 23cm. The dilator and sheath were then advanced over the guidewire under fluoroscopic guidance, and the wire and dilator were removed. The end of the catheter was inserted through the sheath and advanced, and the sheath was peeled away. The port was then accessed with a Huber needle, and blood was aspirated and the port was flushed with heparinized saline. A final fluoroscopic image confirmed appropriate position of the catheter tip within the SVC, without kinking of the catheter. The prolene sutures were tied down. A final flush of 500 units heparin (100 units/mL) was given via the port. The skin was closed with a deep dermal layer of interrupted 3-0 Vicryl suture, followed by a running subcuticular 4-0 monocryl suture. Dermabond was applied.  The patient tolerated the procedure with no apparent complications. All counts were correct x2 at the end of the procedure. The patient was extubated and taken to PACU in stable condition.  Michaelle Birks, MD 02/05/22 8:10 AM

## 2022-02-06 ENCOUNTER — Encounter (HOSPITAL_COMMUNITY): Payer: Self-pay | Admitting: Surgery

## 2022-02-06 NOTE — Telephone Encounter (Signed)
EUS scheduled, pt instructed and medications reviewed.  Patient instructions mailed to home.  Patient to call with any questions or concerns.   He is aware to hold his Eliquis 2 days prior

## 2022-02-08 ENCOUNTER — Ambulatory Visit (HOSPITAL_COMMUNITY)
Admission: EM | Admit: 2022-02-08 | Discharge: 2022-02-08 | Disposition: A | Discharge status: Home / Self Care | Payer: Medicare Other | Attending: Student | Admitting: Student

## 2022-02-08 ENCOUNTER — Encounter (HOSPITAL_COMMUNITY): Payer: Self-pay

## 2022-02-08 DIAGNOSIS — L03311 Cellulitis of abdominal wall: Secondary | ICD-10-CM | POA: Diagnosis not present

## 2022-02-08 MED ORDER — DOXYCYCLINE HYCLATE 100 MG PO CAPS: 100.0000 mg | ORAL_CAPSULE | Freq: Two times a day (BID) | ORAL | 0 refills | Status: AC

## 2022-02-08 NOTE — ED Triage Notes (Signed)
Pt reports his skin is red and itchy where his port was place 2 days ago.

## 2022-02-08 NOTE — ED Provider Notes (Signed)
[place]    CSN: [id number] Arrival date & time: 02/08/22  1636      History   Chief Complaint Chief Complaint  Patient presents with   Recurrent Skin Infections    HPI GERY SABEDRA is a 81 y.o. male presenting with red and inflamed skin over the site of the port that was inserted on 02/05/2022.  History of prostate cancer, pancreatic cancer, Parkinson's disease.  States his skin is itchy and uncomfortable.  No drainage.  Has not contacted his surgeon or oncologist.  HPI  Past Medical History:  Diagnosis Date   Atrial flutter ([place])    BPH (benign prostatic hyperplasia)    Cancer ([place])    PROSTATE   Diverticulosis 2015   Dysrhythmia    Parkinson's disease ([place])    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 ([place])    Prostate cancer metastatic to bone ([place])    REM sleep behavior disorder    Sleep apnea    BiPap    Patient Active Problem List   Diagnosis Date Noted   Pancreatic cancer ([place]) 01/01/2022   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 ([place]) 07/08/2020   Atherosclerosis of aorta ([place]) 04/10/2020   Heart palpitations 12/29/2019   Atrial flutter with rapid ventricular response ([place]) 08/18/2019   New onset atrial flutter ([place]) 08/16/2019   Encounter for antineoplastic chemotherapy 07/17/2019   Prostate cancer metastatic to bone ([place]) 05/24/2019   Establishing care with new doctor, encounter for 09/26/2018   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/30/2015   Parkinson's disease ([place]) 11/07/2015   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   Hx of colonic polyps 05/18/2014   BPH (benign prostatic hyperplasia) 01/12/2014   Sleep apnea treated with nocturnal BiPAP 01/17/2012   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    Past Surgical History:  Procedure Laterality Date   APPENDECTOMY  1963   BIOPSY  12/25/2021   Procedure: BIOPSY;  Surgeon: Irving Copas., MD;  Location: [place];  Service: Gastroenterology;;   COLONOSCOPY  2010, 2015   ESOPHAGOGASTRODUODENOSCOPY (EGD) WITH PROPOFOL N/A 12/25/2021   Procedure: ESOPHAGOGASTRODUODENOSCOPY (EGD) WITH PROPOFOL;  Surgeon: Irving Copas., MD;  Location: [place];  Service: Gastroenterology;  Laterality: N/A;   EUS N/A 12/25/2021   Procedure: UPPER ENDOSCOPIC ULTRASOUND (EUS) RADIAL;  Surgeon: Irving Copas., MD;  Location: [place];  Service: Gastroenterology;  Laterality: N/A;   FINE NEEDLE ASPIRATION  12/25/2021   Procedure: FINE NEEDLE ASPIRATION (FNA) LINEAR;  Surgeon: Irving Copas., MD;  Location: [facility] ENDOSCOPY;  Service: Gastroenterology;;   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   KYPHOPLASTY N/A 08/15/2020   Procedure: L2 compression fracture;  Surgeon: Hessie Knows, MD;  Location: [facility];  Service: Orthopedics;  Laterality: N/A;   KYPHOPLASTY N/A 08/29/2020   Procedure: T8 KYPHOPLASTY;  Surgeon: Hessie Knows, MD;  Location: [facility];  Service: Orthopedics;  Laterality: N/A;   KYPHOPLASTY N/A 11/12/2020   Procedure: L1 KYPHOPLASTY;  Surgeon: Hessie Knows, MD;  Location: [facility];  Service: Orthopedics;  Laterality: N/A;   PORTACATH PLACEMENT N/A 02/05/2022   Procedure: INSERTION PORT-A-CATH;  Surgeon: Dwan Bolt, MD;  Location: [facility];  Service: General;  Laterality: N/A;       Home Medications    Prior to Admission medications   Medication Sig Start Date End Date Taking? Authorizing Provider  doxycycline (VIBRAMYCIN) 100 MG capsule Take 1 capsule (100 mg total) by mouth 2 (two) times daily for 7  days. 02/08/22 02/15/22 Yes Hazel Sams, PA-C  acetaminophen (TYLENOL) 325 MG tablet Take 325 mg by mouth every 6 (six) hours as needed for moderate pain.    [provider]  apixaban (ELIQUIS) 5 MG TABS tablet Take 1 tablet (5 mg total) by mouth 2 (two) times daily. 12/27/21   Mansouraty, Telford Nab., MD  carbidopa-levodopa (SINEMET CR) 50-200 MG tablet Take 1 tablet by  mouth at bedtime. 12/08/21   Tat, Eustace Quail, DO  carbidopa-levodopa (SINEMET IR) 25-100 MG tablet 3 tablets at 7 AM/2 at 11 AM/2 tablets at 3 PM/2 tablet at 7 PM 05/26/21   Tat, Eustace Quail, DO  clonazePAM (KLONOPIN) 0.5 MG tablet Take 1 tablet by mouth at bedtime. 02/17/21   [provider]  diltiazem (CARDIZEM) 30 MG tablet Take 1 tablet (30 mg total) by mouth 2 (two) times daily. 12/23/21 06/21/22  Tolia, Sunit, DO  Glycerin-Hypromellose-PEG 400 (VISINE DRY EYE OP) Place 1 drop into both eyes 2 (two) times daily as needed (eye irritation).     [provider]  ketoconazole (NIZORAL) 2 % shampoo Apply 1 application topically once a week.  02/15/19   [provider]  leuprolide, 6 Month, (ELIGARD) 45 MG injection Inject 45 mg into the skin every 6 (six) months.    [provider]  omeprazole (PRILOSEC) 20 MG capsule 1 capsule 30 minutes before morning meal 09/11/21   [provider]  ondansetron (ZOFRAN) 8 MG tablet Take 1 tablet (8 mg total) by mouth 2 (two) times daily as needed (Nausea or vomiting). 01/05/22   Truitt Merle, MD  prochlorperazine (COMPAZINE) 10 MG tablet Take 1 tablet (10 mg total) by mouth every 6 (six) hours as needed (Nausea or vomiting). 01/05/22   Truitt Merle, MD  rosuvastatin (CRESTOR) 10 MG tablet Take 1 tablet (10 mg total) by mouth daily. 05/16/21   Tolia, Sunit, DO  triamcinolone cream (KENALOG) 0.1 % Apply 1 application topically 2 (two) times daily. Patient taking differently: Apply 1 application. topically 2 (two) times daily as needed (irritation). 01/16/21   Juline Patch, MD  zoledronic acid (RECLAST) 5 MG/100ML SOLN injection Inject 5 mg into the vein once.    [provider]    Family History Family History  Problem Relation Age of Onset   Heart disease Maternal Grandfather    Diabetes Paternal Grandfather    Breast cancer Daughter     Social History Social History   Tobacco Use   Smoking status: Former    Types:  Cigars    Quit date: 09/07/2010    Years since quitting: 11.4   Smokeless tobacco: Never  Vaping Use   Vaping Use: Never used  Substance Use Topics   Alcohol use: Yes    Comment: 1 can beer/day. occas.   Drug use: Never     Allergies   Influenza vaccines   Review of Systems Review of Systems  Skin:  Positive for color change.  All other systems reviewed and are negative.   Physical Exam Triage Vital Signs ED Triage Vitals [02/08/22 1717]  Enc Vitals Group     BP (!) 139/91     Pulse Rate 63     Resp 16     Temp 97.8 F (36.6 C)     Temp Source Oral     SpO2 100 %     Weight      Height      Head Circumference      Peak Flow  Pain Score      Pain Loc      Pain Edu?      Excl. in [place]?    No data found.  Updated Vital Signs BP (!) 139/91 (BP Location: Left Arm)   Pulse 63   Temp 97.8 F (36.6 C) (Oral)   Resp 16   SpO2 100%   Visual Acuity Right Eye Distance:   Left Eye Distance:   Bilateral Distance:    Right Eye Near:   Left Eye Near:    Bilateral Near:     Physical Exam Vitals reviewed.  Constitutional:      General: He is not in acute distress.    Appearance: Normal appearance. He is not ill-appearing.  HENT:     Head: Normocephalic and atraumatic.  Pulmonary:     Effort: Pulmonary effort is normal.  Skin:    Comments: See image below There is erythema and warmth overlying site of port insertion, and also at the base of the neck.   Neurological:     General: No focal deficit present.     Mental Status: He is alert and oriented to person, place, and time.  Psychiatric:        Mood and Affect: Mood normal.        Behavior: Behavior normal.        Thought Content: Thought content normal.        Judgment: Judgment normal.       UC Treatments / Results  Labs (all labs ordered are listed, but only abnormal results are displayed) Labs Reviewed - No data to display  EKG   Radiology No results found.  Procedures Procedures  (including critical care time)  Medications Ordered in UC Medications - No data to display  Initial Impression / Assessment and Plan / UC Course  I have reviewed the triage vital signs and the nursing notes.  Pertinent labs & imaging results that were available during my care of the patient were reviewed by me and considered in my medical decision making (see chart for details).     This patient is a very pleasant 81 y.o. year old male presenting with cellulitis following port insertion that occurred 02/05/22. Afebrile, nontachy. Doxycycline sent. F/u with surgeon and oncology tomorrow 02/09/22. ED return precautions discussed. Patient and daughter verbalizes understanding and agreement.  .   Final Clinical Impressions(s) / UC Diagnoses   Final diagnoses:  Cellulitis of abdominal wall     Discharge Instructions      -Doxycycline twice daily for 7 days.  Make sure to wear sunscreen while spending time outside while on this medication as it can increase your chance of sunburn. You can take this medication with food if you have a sensitive stomach. -Wash the area with gentle soap and water. Avoid hydrogen peroxide or alcohol to cleanse  -Call your surgeon tomorrow, and follow-up with your oncologist on Tuesday 02/10/22 as scheduled.   ED Prescriptions     Medication Sig Dispense Auth. Provider   doxycycline (VIBRAMYCIN) 100 MG capsule Take 1 capsule (100 mg total) by mouth 2 (two) times daily for 7 days. 14 capsule Hazel Sams, PA-C      PDMP not reviewed this encounter.   Hazel Sams, PA-C 02/08/22 [phone]

## 2022-02-08 NOTE — Discharge Instructions (Addendum)
-  Doxycycline twice daily for 7 days.  Make sure to wear sunscreen while spending time outside while on this medication as it can increase your chance of sunburn. You can take this medication with food if you have a sensitive stomach. -Wash the area with gentle soap and water. Avoid hydrogen peroxide or alcohol to cleanse  -Call your surgeon tomorrow, and follow-up with your oncologist on Tuesday 02/10/22 as scheduled.

## 2022-02-09 ENCOUNTER — Telehealth: Payer: Self-pay

## 2022-02-09 NOTE — Telephone Encounter (Signed)
This nurse returned a call to patient who left a mess stating that he had port placed on 6/1 and he went to ED due to symptoms of infection and was advised that he had an infection and began a treatment of Doxycycline.  Patient is requesting to know if he should come for his infusion on 6/6.  This nurse will forward information to provider.

## 2022-02-10 ENCOUNTER — Other Ambulatory Visit: Payer: Medicare Other

## 2022-02-10 ENCOUNTER — Encounter: Payer: Self-pay | Admitting: Genetic Counselor

## 2022-02-10 ENCOUNTER — Inpatient Hospital Stay: Payer: Medicare Other | Attending: Internal Medicine | Admitting: Physician Assistant

## 2022-02-10 ENCOUNTER — Inpatient Hospital Stay (HOSPITAL_BASED_OUTPATIENT_CLINIC_OR_DEPARTMENT_OTHER): Payer: Medicare Other | Admitting: Genetic Counselor

## 2022-02-10 ENCOUNTER — Inpatient Hospital Stay: Payer: Medicare Other

## 2022-02-10 ENCOUNTER — Other Ambulatory Visit: Payer: Self-pay

## 2022-02-10 VITALS — BP 122/72 | HR 56 | Temp 97.4°F | Resp 15 | Wt 156.1 lb

## 2022-02-10 DIAGNOSIS — C259 Malignant neoplasm of pancreas, unspecified: Secondary | ICD-10-CM | POA: Diagnosis not present

## 2022-02-10 DIAGNOSIS — K861 Other chronic pancreatitis: Secondary | ICD-10-CM | POA: Insufficient documentation

## 2022-02-10 DIAGNOSIS — J984 Other disorders of lung: Secondary | ICD-10-CM | POA: Diagnosis not present

## 2022-02-10 DIAGNOSIS — Z905 Acquired absence of kidney: Secondary | ICD-10-CM | POA: Diagnosis not present

## 2022-02-10 DIAGNOSIS — G20 Parkinson's disease: Secondary | ICD-10-CM | POA: Insufficient documentation

## 2022-02-10 DIAGNOSIS — C61 Malignant neoplasm of prostate: Secondary | ICD-10-CM

## 2022-02-10 DIAGNOSIS — Z5111 Encounter for antineoplastic chemotherapy: Secondary | ICD-10-CM | POA: Insufficient documentation

## 2022-02-10 DIAGNOSIS — Z7901 Long term (current) use of anticoagulants: Secondary | ICD-10-CM | POA: Insufficient documentation

## 2022-02-10 DIAGNOSIS — Z887 Allergy status to serum and vaccine status: Secondary | ICD-10-CM | POA: Insufficient documentation

## 2022-02-10 DIAGNOSIS — Z1379 Encounter for other screening for genetic and chromosomal anomalies: Secondary | ICD-10-CM | POA: Diagnosis not present

## 2022-02-10 DIAGNOSIS — R059 Cough, unspecified: Secondary | ICD-10-CM | POA: Diagnosis not present

## 2022-02-10 DIAGNOSIS — Z593 Problems related to living in residential institution: Secondary | ICD-10-CM | POA: Diagnosis not present

## 2022-02-10 DIAGNOSIS — N183 Chronic kidney disease, stage 3 unspecified: Secondary | ICD-10-CM | POA: Insufficient documentation

## 2022-02-10 DIAGNOSIS — C7951 Secondary malignant neoplasm of bone: Secondary | ICD-10-CM | POA: Insufficient documentation

## 2022-02-10 DIAGNOSIS — Z79899 Other long term (current) drug therapy: Secondary | ICD-10-CM | POA: Insufficient documentation

## 2022-02-10 DIAGNOSIS — M4856XA Collapsed vertebra, not elsewhere classified, lumbar region, initial encounter for fracture: Secondary | ICD-10-CM | POA: Insufficient documentation

## 2022-02-10 DIAGNOSIS — M4854XA Collapsed vertebra, not elsewhere classified, thoracic region, initial encounter for fracture: Secondary | ICD-10-CM | POA: Insufficient documentation

## 2022-02-10 DIAGNOSIS — R109 Unspecified abdominal pain: Secondary | ICD-10-CM | POA: Insufficient documentation

## 2022-02-10 DIAGNOSIS — G8929 Other chronic pain: Secondary | ICD-10-CM | POA: Insufficient documentation

## 2022-02-10 DIAGNOSIS — C250 Malignant neoplasm of head of pancreas: Secondary | ICD-10-CM | POA: Diagnosis not present

## 2022-02-10 DIAGNOSIS — Z9049 Acquired absence of other specified parts of digestive tract: Secondary | ICD-10-CM | POA: Diagnosis not present

## 2022-02-10 DIAGNOSIS — I700 Atherosclerosis of aorta: Secondary | ICD-10-CM | POA: Insufficient documentation

## 2022-02-10 DIAGNOSIS — R0602 Shortness of breath: Secondary | ICD-10-CM | POA: Insufficient documentation

## 2022-02-10 DIAGNOSIS — T80212A Local infection due to central venous catheter, initial encounter: Secondary | ICD-10-CM

## 2022-02-10 LAB — CBC WITH DIFFERENTIAL (CANCER CENTER ONLY)
Abs Immature Granulocytes: 0.04 10*3/uL (ref 0.00–0.07)
Basophils Absolute: 0 10*3/uL (ref 0.0–0.1)
Basophils Relative: 1 %
Eosinophils Absolute: 0.3 10*3/uL (ref 0.0–0.5)
Eosinophils Relative: 6 %
HCT: 35.9 % — ABNORMAL LOW (ref 39.0–52.0)
Hemoglobin: 11.7 g/dL — ABNORMAL LOW (ref 13.0–17.0)
Immature Granulocytes: 1 %
Lymphocytes Relative: 35 %
Lymphs Abs: 1.8 10*3/uL (ref 0.7–4.0)
MCH: 31.3 pg (ref 26.0–34.0)
MCHC: 32.6 g/dL (ref 30.0–36.0)
MCV: 96 fL (ref 80.0–100.0)
Monocytes Absolute: 0.6 10*3/uL (ref 0.1–1.0)
Monocytes Relative: 12 %
Neutro Abs: 2.4 10*3/uL (ref 1.7–7.7)
Neutrophils Relative %: 45 %
Platelet Count: 142 10*3/uL — ABNORMAL LOW (ref 150–400)
RBC: 3.74 MIL/uL — ABNORMAL LOW (ref 4.22–5.81)
RDW: 14 % (ref 11.5–15.5)
WBC Count: 5.2 10*3/uL (ref 4.0–10.5)
nRBC: 0 % (ref 0.0–0.2)

## 2022-02-10 LAB — CMP (CANCER CENTER ONLY)
ALT: <5 U/L (ref 0–44)
AST: 27 U/L (ref 15–41)
Albumin: 4.2 g/dL (ref 3.5–5.0)
Alkaline Phosphatase: 56 U/L (ref 38–126)
Anion gap: 5 (ref 5–15)
BUN: 19 mg/dL (ref 8–23)
CO2: 28 mmol/L (ref 22–32)
Calcium: 9.8 mg/dL (ref 8.9–10.3)
Chloride: 107 mmol/L (ref 98–111)
Creatinine: 1.4 mg/dL — ABNORMAL HIGH (ref 0.61–1.24)
GFR, Estimated: 51 mL/min — ABNORMAL LOW (ref >60)
Glucose, Bld: 93 mg/dL (ref 70–99)
Potassium: 4.1 mmol/L (ref 3.5–5.1)
Sodium: 140 mmol/L (ref 135–145)
Total Bilirubin: 0.7 mg/dL (ref 0.3–1.2)
Total Protein: 7 g/dL (ref 6.5–8.1)

## 2022-02-10 NOTE — Telephone Encounter (Signed)
This is a very last minute note I can see him and assess at 1120 but not later I see he is being seen by Cassie, if he cannot come in sooner I would defer to Cassie to evaluate If it is a minor infection, we can just use peripheral IV for chemo

## 2022-02-10 NOTE — Progress Notes (Signed)
REFERRING PROVIDER: Heilingoetter, Tobe Sos, PA-C  PRIMARY PROVIDER:  [name], MD  PRIMARY REASON FOR VISIT:  Encounter Diagnoses  Name Primary?   Genetic testing Yes   Malignant neoplasm of pancreas, unspecified location of malignancy [facility])    Prostate cancer metastatic to bone [facility])    HISTORY OF PRESENT ILLNESS:   [name] [name], a 81 y.o. male, was seen for a [place] cancer genetics consultation at the request of Cassandra Heilingoetter, PA-C due to a personal and family history of cancer.  [name] [name] presents to clinic today to discuss his genetic test results.  In 2023, at the age of 88, [name] [name] was diagnosed with pancreatic cancer.  He also has a history of metastatic prostate cancer diagnosed at age 8.  CANCER HISTORY:  Oncology History Overview Note  # PROSTATE CANCER- METATSTATIC to BONE; PSA- 26.5. Sclerotic 1.5 cm left ischial lesion/ Sclerotic medial left iliac bone 1.5 cm lesion (series 2/image 47), increased from 1.0 cm. Gleason score of 4+5= 9; with almost all cores involved greater than 80%.  9/16 Lupron 38-monthdepot on 9/16. [Urology; Dr.Siniski]  # MID OCT 2020- Zytiga 1000 mg+ prednisone; stopped December 2021 [poor tolerance if with RVR]; DISCONTINUED.   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       Prostate cancer metastatic to bone ([facility]  05/24/2019 Initial Diagnosis   Prostate cancer metastatic to bone ([facility]   Pancreatic cancer ([facility]  10/02/2021 Imaging   CT ABDOMEN PELVIS W CONTRAST   IMPRESSION: 1. There is new pancreatic ductal dilation with an indeterminate 19 mm hypodense low-density mass area in the head/uncinate process of the pancreas. Recommend further evaluation with dedicated MRI/MRCP with contrast. 2.  No evidence of bowel obstruction.  Moderate rectal stool ball. 3. Scattered peripherally located clustered pulmonary nodules in the RIGHT middle lobe. Findings are likely infectious or inflammatory in etiology. Given history of malignancy, recommend follow-up as per clinical protocol.   10/28/2021 Imaging   MR ABDOMEN MRCP W WO CONTAST   IMPRESSION: 1. Examination is significantly limited by breath motion artifact throughout. 2. The main pancreatic duct is diffusely dilated from the level of the superior pancreatic head, measuring up to 0.7 cm. 3. In the inferior pancreatic head and uncinate, there is a multilobulated, fluid signal cystic lesion measuring 1.8 x 1.0 cm. Due to breath motion artifact it is difficult to determine whether this communicates to the adjacent duct. There are multiple additional subcentimeter cystic lesions scattered throughout the pancreas, several of which clearly communicate to the main pancreatic duct. 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 Recommend EUS/FNA for further diagnosis given the presence of pancreatic ductal dilatation. 4. Status post left nephrectomy. 5. No evidence of recurrent or metastatic disease in the abdomen.   12/25/2021 Procedure   UPPER ENDOSCOPIC ULTRASOUND-By Dr. MRush Landmark - A mass-like region was identified in the pancreatic head where the pancreatic duct dilates with multiple cystic regions noted throughout the pancreas as well. 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 However, the endosonographic appearance is suspicious for potential adenocarcinoma. This was staged T2 N0 Mx by endosonographic criteria. T - No malignant-appearing lymph nodes were visualized in the celiac region (level 20), peripancreatic region and porta hepatis region.  12/25/2021 Pathology Results   CYTOLOGY - NON PAP  CASE: [id number]   FINAL MICROSCOPIC DIAGNOSIS:  A.  PANCREAS, HEAD, FINE NEEDLE ASPIRATION:  - Malignant cells consistent with adenocarcinoma     01/01/2022 Initial Diagnosis   Pancreatic cancer ([place])   01/13/2022 -  Chemotherapy   Patient is on Treatment Plan : PANCREAS Gemcitabine D1,15 q28d x 4 Cycles      01/26/2022 Cancer Staging   Staging form: Exocrine Pancreas, AJCC 8th Edition - Clinical: Stage IB (cT2, cN0, cM0) - Signed by Truitt Merle, MD on 01/26/2022 Total positive nodes: 0      Past Medical History:  Diagnosis Date   Atrial flutter (HCC)    BPH (benign prostatic hyperplasia)    Cancer ([place])    PROSTATE   Diverticulosis 2015   Dysrhythmia    Parkinson's disease ([place])    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 ([place])    Prostate cancer metastatic to bone ([place])    REM sleep behavior disorder    Sleep apnea    BiPap    Past Surgical History:  Procedure Laterality Date   APPENDECTOMY  1963   BIOPSY  12/25/2021   Procedure: BIOPSY;  Surgeon: Irving Copas., MD;  Location: [place];  Service: Gastroenterology;;   COLONOSCOPY  2010, 2015   ESOPHAGOGASTRODUODENOSCOPY (EGD) WITH PROPOFOL N/A 12/25/2021   Procedure: ESOPHAGOGASTRODUODENOSCOPY (EGD) WITH PROPOFOL;  Surgeon: Irving Copas., MD;  Location: [place];  Service: Gastroenterology;  Laterality: N/A;   EUS N/A 12/25/2021   Procedure: UPPER ENDOSCOPIC ULTRASOUND (EUS) RADIAL;  Surgeon: Irving Copas., MD;  Location: [place];  Service: Gastroenterology;  Laterality: N/A;   FINE NEEDLE ASPIRATION  12/25/2021   Procedure: FINE NEEDLE ASPIRATION (FNA) LINEAR;  Surgeon: Irving Copas., MD;  [facility];  Service: Gastroenterology;;   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   KYPHOPLASTY N/A 08/15/2020   Procedure: L2 compression fracture;  Surgeon: Hessie Knows, MD;  Location: [facility];  Service: Orthopedics;  Laterality: N/A;   KYPHOPLASTY N/A 08/29/2020   Procedure: T8 KYPHOPLASTY;  Surgeon:  Hessie Knows, MD;  Location: [facility];  Service: Orthopedics;  Laterality: N/A;   KYPHOPLASTY N/A 11/12/2020   Procedure: L1 KYPHOPLASTY;  Surgeon: Hessie Knows, MD;  Location: [facility];  Service: Orthopedics;  Laterality: N/A;   PORTACATH PLACEMENT N/A 02/05/2022   Procedure: INSERTION PORT-A-CATH;  Surgeon: Dwan Bolt, MD;  Location: [facility];  Service: General;  Laterality: N/A;    Social History   Socioeconomic History   Marital status: Widowed    Spouse name: Genette   Number of children: 5   Years of education: Not on file   Highest education level: Not on file  Occupational History   Occupation: retired    Comment: Nutritional therapist  Tobacco Use   Smoking status: Former    Types: Cigars    Quit date: 09/07/2010    Years since quitting: 11.4   Smokeless tobacco: Never  Vaping Use   Vaping Use: Never used  Substance and Sexual Activity   Alcohol use: Yes    Comment: 1 can beer/day. occas.   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    Social Determinants of Health   Financial Resource Strain: Not on file  Food Insecurity: Not on  file  Transportation Needs: Not on file  Physical Activity: Not on file  Stress: Not on file  Social Connections: Not on file     FAMILY HISTORY:  We obtained a detailed, 4-generation family history.  Significant diagnoses are listed below: Family History  Problem Relation Age of Onset   Heart disease Maternal Grandfather    Diabetes Paternal Grandfather    Breast cancer Daughter      [name] [name] daughter was diagnosed with breast cancer at age 11, her genetic testing was negative. There is no additional family history of cancer.   GENETIC COUNSELING ASSESSMENT: During today's session we discussed [name] [name] genetic testing results as noted  below.   GENETIC TEST RESULTS:  The [facility] CancerNext-Expanded Panel found no pathogenic mutations.   The CancerNext-Expanded gene panel offered by [facility] and includes sequencing, rearrangement, and RNA analysis for the following 77 genes: AIP, ALK, APC, ATM, AXIN2, BAP1, BARD1, BLM, BMPR1A, BRCA1, BRCA2, BRIP1, CDC73, CDH1, CDK4, CDKN1B, CDKN2A, CHEK2, CTNNA1, DICER1, FANCC, FH, FLCN, GALNT12, KIF1B, LZTR1, MAX, MEN1, MET, MLH1, MSH2, MSH3, MSH6, MUTYH, NBN, NF1, NF2, NTHL1, PALB2, PHOX2B, PMS2, POT1, PRKAR1A, PTCH1, PTEN, RAD51C, RAD51D, RB1, RECQL, RET, SDHA, SDHAF2, SDHB, SDHC, SDHD, SMAD4, SMARCA4, SMARCB1, SMARCE1, STK11, SUFU, TMEM127, TP53, TSC1, TSC2, VHL and XRCC2 (sequencing and deletion/duplication); EGFR, EGLN1, HOXB13, KIT, MITF, PDGFRA, POLD1, and POLE (sequencing only); EPCAM and GREM1 (deletion/duplication only).    The test report has been scanned into EPIC and is located under the Molecular Pathology section of the Results Review tab.  A portion of the result report is included below for reference. Genetic testing reported out on 01/26/2022.       Genetic testing identified a variant of uncertain significance (VUS) in the BLM gene (p.[id number]) and GALNT12 gene ([id number]).  At this time, it is unknown if this variants are associated with an increased risk for cancer or if it is benign, but most uncertain variants are reclassified to benign. It should not be used to make medical management decisions. With time, we suspect the laboratory will determine the significance of this variant, if any. If the laboratory reclassifies this variant, we will attempt to contact [name] [name] to discuss it further.   Even though a pathogenic variant was not identified, possible explanations for his personal history of cancer may include: There may be no hereditary risk for cancer in the family. The cancers in [name] [name] and/or his family may be due to other genetic or environmental  factors. There may be a gene mutation in one of these genes that current testing methods cannot detect, but that chance is small. There could be another gene that has not yet been discovered, or that we have not yet tested, that is responsible for the cancer diagnoses in the family.   Therefore, it is important to remain in touch with cancer genetics in the future so that we can continue to offer [name] [name] the most up to date genetic testing.   ADDITIONAL GENETIC TESTING:  We discussed with [name] [name] that his genetic testing was fairly extensive.  If there are genes identified to increase cancer risk that can be analyzed in the future, we would be happy to discuss and coordinate this testing at that time.      CANCER SCREENING RECOMMENDATIONS:  [name] [name] test result is considered negative (normal).  This means that we have not identified a hereditary cause for his personal and family history of cancer at this time.  An individual's cancer risk and medical management are not determined by genetic test results alone. Overall cancer risk assessment incorporates additional factors, including personal medical history, family history, and any available genetic information that may result in a personalized plan for cancer prevention and surveillance. Therefore, it is recommended he continue to follow the cancer management and screening guidelines provided by his oncology and primary healthcare provider.  RECOMMENDATIONS FOR FAMILY MEMBERS:   Since he did not inherit a mutation in a cancer predisposition gene included on this panel, his children could not have inherited a mutation from him in one of these genes. We do not recommend familial testing for the BLM and TMHDQ22 variants of uncertain significance (VUS).  FOLLOW-UP:  Cancer genetics is a rapidly advancing field and it is possible that new genetic tests will be appropriate for him and/or his family members in the future. We encouraged  him to remain in contact with cancer genetics on an annual basis so we can update his personal and family histories and let him know of advances in cancer genetics that may benefit this family.   Our contact number was provided. Mr. Rushlow questions were answered to his satisfaction, and he knows he is welcome to call us at anytime with additional questions or concerns.   Lucille Passy, MS, [facility] Genetic Counselor [email or web address] (P) [phone]  The patient was seen for a total of 15 minutes in face-to-face genetic counseling.  The patient brought his daughter.   _______________________________________________________________________ For Office Staff:  Number of people involved in session: 2 Was an Intern/ student involved with case: no

## 2022-02-11 ENCOUNTER — Encounter: Payer: Self-pay | Admitting: Physician Assistant

## 2022-02-11 LAB — CANCER ANTIGEN 19-9: CA 19-9: 559 U/mL — ABNORMAL HIGH (ref 0–35)

## 2022-02-13 ENCOUNTER — Encounter: Payer: Self-pay | Admitting: Internal Medicine

## 2022-02-13 ENCOUNTER — Encounter: Payer: Self-pay | Admitting: Hematology

## 2022-02-15 LAB — CULTURE, BLOOD (SINGLE)
Culture: NO GROWTH
Special Requests: ADEQUATE

## 2022-02-17 NOTE — Progress Notes (Signed)
[facility] OFFICE PROGRESS NOTE  [name] Cha, MD 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 Sclerotic 1.5 cm left ischial lesion/ Sclerotic medial left iliac bone 1.5 cm lesion (series 2/image 47), increased from 1.0 cm. Gleason score of 4+5= 9; with almost all cores involved greater than 80%.  9/16 Lupron 29-monthdepot on 9/16. [Urology; Dr.Siniski]  # MID OCT 2020- Zytiga 1000 mg+ prednisone; stopped December 2021 [poor tolerance if with RVR]; DISCONTINUED.   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       Prostate cancer metastatic to bone ([facility]  05/24/2019 Initial Diagnosis   Prostate cancer metastatic to bone ([facility]   Pancreatic cancer ([facility]  10/02/2021 Imaging   CT ABDOMEN PELVIS W CONTRAST   IMPRESSION: 1. There is new pancreatic ductal dilation with an indeterminate 19 mm hypodense low-density mass area in the head/uncinate process of the pancreas. Recommend further evaluation with dedicated MRI/MRCP with contrast. 2. No evidence of bowel obstruction.  Moderate rectal stool ball. 3. Scattered peripherally located clustered pulmonary nodules in the RIGHT middle lobe. Findings are likely infectious or inflammatory in etiology. Given history of malignancy, recommend follow-up as per clinical protocol.   10/28/2021 Imaging   MR ABDOMEN MRCP W WO CONTAST   IMPRESSION: 1. Examination is significantly limited by breath motion artifact throughout. 2. The main pancreatic duct is diffusely dilated from the level of the superior pancreatic head, measuring up to 0.7 cm. 3. In the inferior pancreatic head and uncinate,  there is a multilobulated, fluid signal cystic lesion measuring 1.8 x 1.0 cm. Due to breath motion artifact it is difficult to determine whether this communicates to the adjacent duct. There are multiple additional subcentimeter cystic lesions scattered throughout the pancreas, several of which clearly communicate to the main pancreatic duct. 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 Recommend EUS/FNA for further diagnosis given the presence of pancreatic ductal dilatation. 4. Status post left nephrectomy. 5. No evidence of recurrent or metastatic disease in the abdomen.   12/25/2021 Procedure   UPPER ENDOSCOPIC ULTRASOUND-By Dr. MRush Landmark - A mass-like region was identified in the pancreatic head where the pancreatic duct dilates with multiple cystic regions noted throughout the pancreas as well. 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 However, the endosonographic appearance is suspicious for potential adenocarcinoma. This was staged T2 N0 Mx by endosonographic criteria. T - No malignant-appearing lymph nodes were visualized in the celiac region (level 20), peripancreatic region and porta hepatis region.   12/25/2021 Pathology Results   CYTOLOGY - NON PAP  CASE: [id number]   FINAL MICROSCOPIC DIAGNOSIS:  A. PANCREAS, HEAD, FINE NEEDLE ASPIRATION:  - Malignant cells consistent with adenocarcinoma     01/01/2022 Initial Diagnosis   P[place]reatic cancer ([facility]   01/13/2022 -  Chemotherapy   Patient is on Treatment Plan : PANCREAS Gemcitabine D1,15 q28d x 4 Cycles     01/26/2022 Cancer Staging   Staging form: Exocrine Pancreas, AJCC 8th Edition - Clinical: Stage IB (cT2, cN0, cM0) - Signed by FTruitt Merle MD on 01/26/2022 Total positive nodes: 0    Genetic Testing   ACephus Shelling  CancerNext-Expanded Panel was Negative. Of note, a variant of uncertain significance was identified in the BLM gene (p.[id number]) and GALNT12 gene  ([id number]). Report date is 01/26/2022.  The CancerNext-Expanded gene panel offered by [facility] and includes sequencing, rearrangement, and RNA analysis for the following 77 genes: AIP, ALK, APC, ATM, AXIN2, BAP1, BARD1, BLM, BMPR1A, BRCA1, BRCA2, BRIP1, CDC73, CDH1, CDK4, CDKN1B, CDKN2A, CHEK2, CTNNA1, DICER1, FANCC, FH, FLCN, GALNT12, KIF1B, LZTR1, MAX, MEN1, MET, MLH1, MSH2, MSH3, MSH6, MUTYH, NBN, NF1, NF2, NTHL1, PALB2, PHOX2B, PMS2, POT1, PRKAR1A, PTCH1, PTEN, RAD51C, RAD51D, RB1, RECQL, RET, SDHA, SDHAF2, SDHB, SDHC, SDHD, SMAD4, SMARCA4, SMARCB1, SMARCE1, STK11, SUFU, TMEM127, TP53, TSC1, TSC2, VHL and XRCC2 (sequencing and deletion/duplication); EGFR, EGLN1, HOXB13, KIT, MITF, PDGFRA, POLD1, and POLE (sequencing only); EPCAM and GREM1 (deletion/duplication only).      CURRENT THERAPY: Gemcitabine alone, q2weeks, started 01/13/22. Status post 2 treatments.   INTERVAL HISTORY: [name] [name] 81 y.o. male returns to clinic today for a follow-up visit accompanied by his daughter.  The patient was last seen in the clinic by me on 02/10/2022.  The patient is followed by the clinic for pancreatic cancer.  He is was not deemed to be a good surgical candidate.  Dr. Burr Medico recommended dose reduced gemcitabine to 800 mg per metered squared every other week for tolerability.  He tolerated his first cycle well and he is going to proceed with the standard dose of gemcitabine 1000 mg per metered squared starting from cycle #2.   When last seen on 02/10/2022, the patient had recently had a Port-A-Cath placed on 02/05/2022.  A few days after this procedure he presented to an urgent care was given a prescription for doxycycline for a possible Port-A-Cath infection.  He then was urgently seen in the clinic on 02/10/2022 by Dr. Zenia Resides.  She performed a bedside ultrasound which did not show any evidence of true infection.  It was felt that the patient was having possible reaction to the port itself.  He underwent  port removal in office on 02/13/2022.  Since no improvement in his erythema with antibiotics, his antibiotics were discontinued.  He was seen for a wound check on 02/16/2022.  He continues to have some skin erythema and triamcinolone cream was prescribed.  Today, his symptoms are improving. The sensitivity and itching is improving.  He is expected to have another wound check tomorrow at general surgery office. He is disappointed that he cannot have a port-a-cath because his treatment with gemcitabine causes burning at the IV site. He is wondering what can be done to mitigate those effects. He mentions that he is considering stopping chemotherapy and proceeding directly with palliative radiation. He is scheduled for EUS and fiducial marker placement in August. He is wondering what that is for.  Other than that the patient has no new concerns today besides dry itchy skin on his back.  He denies any fever, warmth, or worsening swelling over the prior Port-A-Cath site.  He continues to have a good appetite.  He sometimes has intermittent mild abdominal pain for which he takes Tylenol which brings his pain level down to a 1/2 out of 10.  He denies any dark urine, jaundice, light stools, chest pain, or hemoptysis . Sometimes he may have shortness of breath if bending over but nothing out of the ordinary.  He sometimes has a mild cough/throat irritation for which he will use a cough drop.  As previously mentioned this is not new to him.  Denies any nausea,  vomiting, diarrhea, or constipation.  He is here today for evaluation and repeat blood work before considering starting cycle #3.  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  MEDICATIONS:  Current Outpatient Medications  Medication Sig Dispense Refill   acetaminophen (TYLENOL) 325 MG tablet Take 325 mg by mouth every 6 (six) hours as needed for moderate pain.     apixaban (ELIQUIS) 5 MG TABS tablet Take 1 tablet (5 mg total) by mouth 2 (two) times daily. 60 tablet 1   carbidopa-levodopa (SINEMET CR) 50-200 MG tablet Take 1 tablet by mouth at bedtime. 90 tablet 0   carbidopa-levodopa (SINEMET IR) 25-100 MG tablet 3 tablets at 7 AM/2 at 11 AM/2 tablets at 3 PM/2 tablet at 7 PM 810 tablet 3   clonazePAM (KLONOPIN) 0.5 MG tablet Take 1 tablet by mouth at bedtime.     diltiazem (CARDIZEM) 30 MG tablet Take 1 tablet (30 mg total) by mouth 2 (two) times daily. 180 tablet 1   Glycerin-Hypromellose-PEG 400 (VISINE DRY EYE OP) Place 1 drop into both eyes 2 (two) times daily as needed (eye irritation).      ketoconazole (NIZORAL) 2 % shampoo Apply 1 application topically once a week.      leuprolide, 6 Month, (ELIGARD) 45 MG injection Inject 45 mg into the skin every 6 (six) months.     omeprazole (PRILOSEC) 20 MG capsule 1 capsule 30 minutes before morning meal     ondansetron (ZOFRAN) 8 MG tablet Take 1 tablet (8 mg total) by mouth 2 (two) times daily as needed (Nausea or vomiting). 30 tablet 1   prochlorperazine (COMPAZINE) 10 MG tablet Take 1 tablet (10 mg total) by mouth every 6 (six) hours as needed (Nausea or vomiting). 30 tablet 1   rosuvastatin (CRESTOR) 10 MG tablet Take 1 tablet (10 mg total) by mouth daily. 90 tablet 3   triamcinolone cream (KENALOG) 0.1 % Apply 1 application topically 2 (two) times daily. (Patient taking differently: Apply 1 application. topically 2 (two) times daily as needed (irritation).) 453.6 g 1   zoledronic acid (RECLAST) 5 MG/100ML SOLN injection Inject 5 mg into the vein once.     No current facility-administered medications for  this visit.    SURGICAL HISTORY:  Past Surgical History:  Procedure Laterality Date   APPENDECTOMY  1963   BIOPSY  12/25/2021   Procedure: BIOPSY;  Surgeon: Irving Copas., MD;  Location: [facility];  Service: Gastroenterology;;   COLONOSCOPY  2010, 2015   ESOPHAGOGASTRODUODENOSCOPY (EGD) WITH PROPOFOL N/A 12/25/2021   Procedure: ESOPHAGOGASTRODUODENOSCOPY (EGD) WITH PROPOFOL;  Surgeon: Irving Copas., MD;  Location: [facility];  Service: Gastroenterology;  Laterality: N/A;   EUS N/A 12/25/2021   Procedure: UPPER ENDOSCOPIC ULTRASOUND (EUS) RADIAL;  Surgeon: Irving Copas., MD;  Location: [facility];  Service: Gastroenterology;  Laterality: N/A;   FINE NEEDLE ASPIRATION  12/25/2021   Procedure: FINE NEEDLE ASPIRATION (FNA) LINEAR;  Surgeon: Irving Copas., MD;  Location: [facility];  Service: Gastroenterology;;  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   KYPHOPLASTY N/A 08/15/2020   Procedure: L2 compression fracture;  Surgeon: Hessie Knows, MD;  Location: [facility];  Service: Orthopedics;  Laterality: N/A;   KYPHOPLASTY N/A 08/29/2020   Procedure: T8 KYPHOPLASTY;  Surgeon: Hessie Knows, MD;  Location: [facility];  Service: Orthopedics;  Laterality: N/A;   KYPHOPLASTY N/A 11/12/2020   Procedure: L1 KYPHOPLASTY;  Surgeon: Hessie Knows, MD;  Location: [facility];  Service: Orthopedics;  Laterality: N/A;   PORTACATH PLACEMENT N/A 02/05/2022   Procedure: INSERTION PORT-A-CATH;  Surgeon: Dwan Bolt, MD;  Location: [facility];  Service: General;  Laterality: N/A;    REVIEW OF SYSTEMS:   Constitutional: Positive for fatigue (baseline).  Negative for appetite change, chills, fever and unexpected weight change.  HENT: Negative for mouth sores, nosebleeds, sore throat and trouble swallowing.   Eyes: Negative for eye problems and icterus.  Respiratory: Positive for dyspnea with certain activities.  Negative for cough, hemoptysis, and wheezing.   Cardiovascular: Negative  for chest pain and leg swelling.  Gastrointestinal: Positive for intermittent mid abdominal discomfort and bloating (stable).  Negative for constipation, diarrhea, nausea and vomiting.  Musculoskeletal: Positive for chronic back pain. Negative for gait problem, neck pain and neck stiffness.  Skin: Positive for erythema and swelling over Port-A-Cath site (improved compared to prior) Neurological: Negative for dizziness, extremity weakness, gait problem, headaches, light-headedness and seizures.  Hematological: Negative for adenopathy. Does not bruise/bleed easily.  Psychiatric/Behavioral: Negative for confusion, depression and sleep disturbance. The patient is not nervous/anxious.     PHYSICAL EXAMINATION:  Blood pressure 101/67, pulse 61, temperature (!) 97.3 F (36.3 C), resp. rate 18, weight 152 lb 9.6 oz (69.2 kg), SpO2 97 %.  ECOG PERFORMANCE STATUS: 1-2  Physical Exam  Constitutional: Oriented to person, place, and time and thin appearing male and in no distress.  HENT:  Head: Normocephalic and atraumatic.  Mouth/Throat: Oropharynx is clear and moist. No oropharyngeal exudate.  Eyes: Conjunctivae are normal. Right eye exhibits no discharge. Left eye exhibits no discharge. No scleral icterus.  Neck: Normal range of motion. Neck supple.  Cardiovascular: Normal rate, regular rhythm, normal heart sounds and intact distal pulses.   Pulmonary/Chest: Effort normal and breath sounds normal. No respiratory distress. No wheezes. No rales.  Abdominal: Soft. Bowel sounds are normal. Exhibits no distension and no mass. There is no tenderness.  Musculoskeletal: Normal range of motion. Exhibits no edema.  Lymphadenopathy:    No cervical adenopathy.  Neurological: Alert and oriented to person, place, and time. Exhibits muscle wasting.  Coordination normal.  Positive for some shuffled gait.  Skin: Dry flaky skin on back. Improving erythema and swelling over port-a-cath site (see note from 02/10/22 for  comparison)  Psychiatric: Mood, memory and judgment normal.  Vitals reviewed.  LABORATORY DATA: Lab Results  Component Value Date   WBC 9.1 02/19/2022   HGB 12.6 (L) 02/19/2022   HCT 38.3 (L) 02/19/2022   MCV 94.3 02/19/2022   PLT 418 (H) 02/19/2022      Chemistry      Component Value Date/Time   NA 138 02/19/2022 1111   NA 139 12/04/2021 1359   K 4.6 02/19/2022 1111   CL 106 02/19/2022 1111   CO2 25 02/19/2022 1111   BUN 20 02/19/2022 1111   BUN 20 12/04/2021 1359   CREATININE 1.41 (H) 02/19/2022 1111      Component Value Date/Time   CALCIUM 9.8 02/19/2022 1111   ALKPHOS 59 02/19/2022 1111   AST 26  02/19/2022 1111   ALT <5 02/19/2022 1111   BILITOT 0.7 02/19/2022 1111       RADIOGRAPHIC STUDIES:  DG CHEST PORT 1 VIEW  Result Date: 02/05/2022 CLINICAL DATA:  Status post right IJ chest port placement EXAM: PORTABLE CHEST 1 VIEW COMPARISON:  Chest radiograph done on 06/17/2021 CT chest done on 01/12/2022 FINDINGS: Transverse diameter of heart is in the upper limits of normal. Thoracic aorta is tortuous. There is interval placement of right IJ chest port with its tip in the superior vena cava. There is no focal pulmonary consolidation. There is no pleural effusion or pneumothorax. There is evidence of vertebroplasty in multiple thoracic and upper lumbar vertebrae. IMPRESSION: Tip of right IJ chest port is seen in the superior vena cava. There are no focal infiltrates. There is no pleural effusion or pneumothorax. Electronically Signed   By: Elmer Picker M.D.   On: 02/05/2022 08:49   DG C-Arm 1-60 Min-No Report  Result Date: 02/05/2022 Fluoroscopy was utilized by the requesting physician.  No radiographic interpretation.     ASSESSMENT/PLAN:  [name] [name] is a 81 y.o. male with    1. Pancreatic adenocarcinoma (T2, N0, Mx) -presented with abdominal discomfort. CT AP on 10/02/21, and MRI/MRCP on 10/28/21 showed 1.8 cm mass in pancreatic head and uncinate. 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 Staged at T2 N0. -baseline CA 19-9 from 01/02/22 was 730. -he met with Dr. Zenia Resides and is felt not to be a good surgical candidate given his multiple medical comorbidities. -staging chest CT showed no definite evidence of metastatic disease. -he began single agent gemcitabine on 01/13/22, to be given every 2 weeks. Plan is for 3-4, or up to 6, months of treatment depending on tolerance. -labs reviewed, no neutropenia or leukocytosis on labs today mild anemia noted.  CMP is pending at this time.  -Cycle #2 was delayed by 1 week while he had his Port-A-Cath evaluated by general surgery.  His Port-A-Cath was ultimately removed on 02/13/2022 due to suspected allergic reaction to the port itself. -I reviewed the patient's recent events with Dr. Burr Medico.  Labs were reviewed.  Recommend that he proceed with cycle #3 today scheduled -If he continues to tolerating well, Dr. Burr Medico may change his treatment to day 1, 8 every 21 days -The plan was for chemo 3 to 6 months, followed by consolidation radiation.  However, the patient notes that the gemcitabine burns at the IV site.  His gemcitabine is going to be diluted in normal saline and warm compresses applied to the extremity.  I also encouraged the patient to hydrate well prior to his infusions to help with IV insertion.  The patient is considering discontinuing treatment due to the burning of Gemzar in the IV site, however he is a little reluctant because his tumor markers have been moving in the right direction.  He would still like to proceed with treatment as planned today but he may consider discontinuing chemo in the future and proceeding with palliative radiation instead.  He will let us know.  -He is scheduled for EUS and fiducial marker placement in August discussed with the patient and his daughter that this is planning for SBRT.   2.   Port-A-Cath reaction -Port-A-Cath placed on 02/05/2022 -On Sunday, 02/08/2022 the patient presented to an urgent care with pain and swelling at the Port-A-Cath site.  He was given a prescription  for doxycycline.   -The patient was evaluated by general surgery who favored that this was a possible reaction to the port itself and it did not appear to be infected.  Therefore the Port-A-Cath was ultimately removed in office on 02/13/2022 -No leukocytosis on labs.  No unstable vitals today.  The area is improving.  He is scheduled for a wound check tomorrow with general surgery.    3. Metastatic prostate cancer  -initially diagnosed with Gleason 4+5 prostate cancer in 04/2019 for elevated PSA of 26.5. Staging CT AP on 05/16/19 showed two suspicious left pelvic lesions. He was started on Eligard by Dr. Diamantina Providence in urology; receiving q70month, next due ~7/26.  -Patient also receives Reclast once a year. -he did not tolerate Zytiga -Last PSA drawn on 10/13/21 showed good response at 0.06.    4. Abdominal pain -Continue to monitor for now.  Symptoms currently controlled with Tylenol as needed.  5. Family history of cancer  -despite having no family history of malignancy, he does qualify for genetic testing due to his pancreatic cancer diagnosis. -testing performed on 01/13/22; results are pending.   6. 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 Sees Dr. TCarles Colletfrom neurology -Socially, [place]  He performs his ADLs.  He has a walker if he walks far distances.  The patient's son lives 10 minutes away -The patient has a solitary right kidney.  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  Has some level of kidney disease.     Plan:  -Follow-up in 2 weeks -Proceed with cycle 3 today as scheduled -Patient will receive  Gemzar diluted in normal saline today. -Reviewed with the patient that the [place] in August is for SBRT planning.  -Encourage good hydration at home. -Patient has some dry skin on the back encouraged him to use lotion.  If he has itching he may use the triamcinolone cream on this area.       No orders of the defined types were placed in this encounter.    The total time spent in the appointment was 20-29 minutes.   Kaysen Sefcik L Ramata Strothman, PA-C 02/19/22

## 2022-02-19 ENCOUNTER — Other Ambulatory Visit: Payer: Self-pay

## 2022-02-19 ENCOUNTER — Inpatient Hospital Stay (HOSPITAL_BASED_OUTPATIENT_CLINIC_OR_DEPARTMENT_OTHER): Payer: Medicare Other | Admitting: Physician Assistant

## 2022-02-19 ENCOUNTER — Inpatient Hospital Stay: Payer: Medicare Other

## 2022-02-19 VITALS — BP 101/67 | HR 61 | Temp 97.3°F | Resp 18 | Wt 152.6 lb

## 2022-02-19 DIAGNOSIS — C250 Malignant neoplasm of head of pancreas: Secondary | ICD-10-CM

## 2022-02-19 DIAGNOSIS — C61 Malignant neoplasm of prostate: Secondary | ICD-10-CM | POA: Diagnosis not present

## 2022-02-19 DIAGNOSIS — R0602 Shortness of breath: Secondary | ICD-10-CM | POA: Diagnosis not present

## 2022-02-19 DIAGNOSIS — Z5111 Encounter for antineoplastic chemotherapy: Secondary | ICD-10-CM | POA: Diagnosis not present

## 2022-02-19 DIAGNOSIS — C259 Malignant neoplasm of pancreas, unspecified: Secondary | ICD-10-CM | POA: Diagnosis not present

## 2022-02-19 DIAGNOSIS — C7951 Secondary malignant neoplasm of bone: Secondary | ICD-10-CM | POA: Diagnosis not present

## 2022-02-19 DIAGNOSIS — G20 Parkinson's disease: Secondary | ICD-10-CM | POA: Diagnosis not present

## 2022-02-19 LAB — CBC WITH DIFFERENTIAL (CANCER CENTER ONLY)
Abs Immature Granulocytes: 0.18 10*3/uL — ABNORMAL HIGH (ref 0.00–0.07)
Basophils Absolute: 0.1 10*3/uL (ref 0.0–0.1)
Basophils Relative: 2 %
Eosinophils Absolute: 0.8 10*3/uL — ABNORMAL HIGH (ref 0.0–0.5)
Eosinophils Relative: 8 %
HCT: 38.3 % — ABNORMAL LOW (ref 39.0–52.0)
Hemoglobin: 12.6 g/dL — ABNORMAL LOW (ref 13.0–17.0)
Immature Granulocytes: 2 %
Lymphocytes Relative: 22 %
Lymphs Abs: 2 10*3/uL (ref 0.7–4.0)
MCH: 31 pg (ref 26.0–34.0)
MCHC: 32.9 g/dL (ref 30.0–36.0)
MCV: 94.3 fL (ref 80.0–100.0)
Monocytes Absolute: 0.9 10*3/uL (ref 0.1–1.0)
Monocytes Relative: 10 %
Neutro Abs: 5.2 10*3/uL (ref 1.7–7.7)
Neutrophils Relative %: 56 %
Platelet Count: 418 10*3/uL — ABNORMAL HIGH (ref 150–400)
RBC: 4.06 MIL/uL — ABNORMAL LOW (ref 4.22–5.81)
RDW: 15.2 % (ref 11.5–15.5)
WBC Count: 9.1 10*3/uL (ref 4.0–10.5)
nRBC: 0 % (ref 0.0–0.2)

## 2022-02-19 LAB — CMP (CANCER CENTER ONLY)
ALT: <5 U/L (ref 0–44)
AST: 26 U/L (ref 15–41)
Albumin: 4.4 g/dL (ref 3.5–5.0)
Alkaline Phosphatase: 59 U/L (ref 38–126)
Anion gap: 7 (ref 5–15)
BUN: 20 mg/dL (ref 8–23)
CO2: 25 mmol/L (ref 22–32)
Calcium: 9.8 mg/dL (ref 8.9–10.3)
Chloride: 106 mmol/L (ref 98–111)
Creatinine: 1.41 mg/dL — ABNORMAL HIGH (ref 0.61–1.24)
GFR, Estimated: 50 mL/min — ABNORMAL LOW (ref >60)
Glucose, Bld: 100 mg/dL — ABNORMAL HIGH (ref 70–99)
Potassium: 4.6 mmol/L (ref 3.5–5.1)
Sodium: 138 mmol/L (ref 135–145)
Total Bilirubin: 0.7 mg/dL (ref 0.3–1.2)
Total Protein: 7.6 g/dL (ref 6.5–8.1)

## 2022-02-19 MED ORDER — PROCHLORPERAZINE MALEATE 10 MG PO TABS
10.0000 mg | ORAL_TABLET | Freq: Once | ORAL | Status: AC
  Administered 2022-02-19: 10 mg via ORAL
  Filled 2022-02-19: qty 1

## 2022-02-19 MED ORDER — SODIUM CHLORIDE 0.9 % IV SOLN: Freq: Once | INTRAVENOUS | Status: DC

## 2022-02-19 MED ORDER — SODIUM CHLORIDE 0.9 % IV SOLN
1000.0000 mg/m2 | Freq: Once | INTRAVENOUS | Status: AC
  Administered 2022-02-19: 1786 mg via INTRAVENOUS
  Filled 2022-02-19: qty 46.97

## 2022-02-19 MED ORDER — SODIUM CHLORIDE 0.9 % IV SOLN: Freq: Once | INTRAVENOUS | Status: AC

## 2022-02-19 NOTE — Patient Instructions (Signed)
[facility] MEDICAL ONCOLOGY   Discharge Instructions: Thank you for choosing [facility] to provide your oncology and hematology care.   If you have a lab appointment with the Cancer Center, please go directly to the Cancer Center and check in at the registration area.   Wear comfortable clothing and clothing appropriate for easy access to any Portacath or PICC line.   We strive to give you quality time with your provider. You may need to reschedule your appointment if you arrive late (15 or more minutes).  Arriving late affects you and other patients whose appointments are after yours.  Also, if you miss three or more appointments without notifying the office, you may be dismissed from the clinic at the provider's discretion.      For prescription refill requests, have your pharmacy contact our office and allow 72 hours for refills to be completed.    Today you received the following chemotherapy and/or immunotherapy agents: gemcitabine      To help prevent nausea and vomiting after your treatment, we encourage you to take your nausea medication as directed.  BELOW ARE SYMPTOMS THAT SHOULD BE REPORTED IMMEDIATELY: *FEVER GREATER THAN 100.4 F (38 C) OR HIGHER *CHILLS OR SWEATING *NAUSEA AND VOMITING THAT IS NOT CONTROLLED WITH YOUR NAUSEA MEDICATION *UNUSUAL SHORTNESS OF BREATH *UNUSUAL BRUISING OR BLEEDING *URINARY PROBLEMS (pain or burning when urinating, or frequent urination) *BOWEL PROBLEMS (unusual diarrhea, constipation, pain near the anus) TENDERNESS IN MOUTH AND THROAT WITH OR WITHOUT PRESENCE OF ULCERS (sore throat, sores in mouth, or a toothache) UNUSUAL RASH, SWELLING OR PAIN  UNUSUAL VAGINAL DISCHARGE OR ITCHING   Items with * indicate a potential emergency and should be followed up as soon as possible or go to the Emergency Department if any problems should occur.  Please show the CHEMOTHERAPY ALERT CARD or IMMUNOTHERAPY ALERT CARD at check-in  to the Emergency Department and triage nurse.  Should you have questions after your visit or need to cancel or reschedule your appointment, please contact [facility] MEDICAL ONCOLOGY  Dept: [phone]  and follow the prompts.  Office hours are 8:00 a.m. to 4:30 p.m. Monday - Friday. Please note that voicemails left after 4:00 p.m. may not be returned until the following business day.  We are closed weekends and major holidays. You have access to a nurse at all times for urgent questions. Please call the main number to the clinic Dept: [phone] and follow the prompts.   For any non-urgent questions, you may also contact your provider using MyChart. We now offer e-Visits for anyone 18 and older to request care online for non-urgent symptoms. For details visit [email or web address].   Also download the MyChart app! Go to the app store, search "MyChart", open the app, select [facility], and log in with your MyChart username and password.  Masks are optional in the cancer centers. If you would like for your care team to wear a mask while they are taking care of you, please let them know. For doctor visits, patients may have with them one support person who is at least 81 years old. At this time, visitors are not allowed in the infusion area. 

## 2022-02-20 LAB — CANCER ANTIGEN 19-9: CA 19-9: 675 U/mL — ABNORMAL HIGH (ref 0–35)

## 2022-02-23 ENCOUNTER — Inpatient Hospital Stay: Payer: Medicare Other

## 2022-02-23 ENCOUNTER — Inpatient Hospital Stay: Payer: Medicare Other | Admitting: Hematology

## 2022-02-26 ENCOUNTER — Telehealth: Payer: Self-pay

## 2022-02-26 NOTE — Telephone Encounter (Signed)
This nurse spoke with patient and made him aware per provider his appointments for 8/10 and 8/24 will be cancelled.  She would like him to proceed with his scheduled Endoscopy on 8/10 and we will reach out to reschedule August appointments.  Patient acknowledged understanding.  No further questions or concerns at this time.

## 2022-02-26 NOTE — Telephone Encounter (Signed)
-----   Message from Truitt Merle, MD sent at 02/25/2022  4:45 PM EDT ----- Please cancel his scheduled appointments with [place] on 8/10 and 8/24, and let him proceed with endoscopy on 8/10. Let him know, thx   [name]  ----- Message ----- From: Estella Husk, LPN Sent: 0/52/5910  10:52 AM EDT To: Truitt Merle, MD  Dr. Burr Medico,  This patient called and stated that he has a scheduling conflict for 2/89.  He wants to know if we can help him.  He has appointments for labs, office visit and infusion and an endoscopy on that day.  Patient states that he is unable to attend all of these appointments.   Please advise on what we can do to help this patient.    Thank You  Talullah Abate

## 2022-03-03 ENCOUNTER — Other Ambulatory Visit: Payer: Self-pay

## 2022-03-03 DIAGNOSIS — C250 Malignant neoplasm of head of pancreas: Secondary | ICD-10-CM

## 2022-03-03 NOTE — Progress Notes (Signed)
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 Sclerotic 1.5 cm left ischial lesion/ Sclerotic medial left iliac bone 1.5 cm lesion (series 2/image 47), increased from 1.0 cm. Gleason score of 4+5= 9; with almost all cores involved greater than 80%.  9/16 Lupron 26-monthdepot on 9/16. [Urology; Dr.Siniski]  # MID OCT 2020- Zytiga 1000 mg+ prednisone; stopped December 2021 [poor tolerance if with RVR]; DISCONTINUED.   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       Prostate cancer metastatic to bone ([facility]  05/24/2019 Initial Diagnosis   Prostate cancer metastatic to bone ([facility]   [name]  10/02/2021 Imaging   CT ABDOMEN PELVIS W CONTRAST   IMPRESSION: 1. There is new pancreatic ductal dilation with an indeterminate 19 mm hypodense low-density mass area in the head/uncinate process of the pancreas. Recommend further evaluation with dedicated MRI/MRCP with contrast. 2. No evidence of bowel obstruction.  Moderate rectal stool ball. 3. Scattered peripherally located clustered pulmonary nodules in the RIGHT middle lobe. Findings are likely infectious or inflammatory in etiology. Given history of malignancy, recommend follow-up as per clinical protocol.   10/28/2021  Imaging   MR ABDOMEN MRCP W WO CONTAST   IMPRESSION: 1. Examination is significantly limited by breath motion artifact throughout. 2. The main pancreatic duct is diffusely dilated from the level of the superior pancreatic head, measuring up to 0.7 cm. 3. In the inferior pancreatic head and uncinate, there is a multilobulated, fluid signal cystic lesion measuring 1.8 x 1.0 cm. Due to breath motion artifact it is difficult to determine whether this communicates to the adjacent duct. There are multiple additional subcentimeter cystic lesions scattered throughout the pancreas, several of which clearly communicate to the main pancreatic duct. 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 Recommend EUS/FNA for further diagnosis given the presence of pancreatic ductal dilatation. 4. Status post left nephrectomy. 5. No evidence of recurrent or metastatic disease in the abdomen.   12/25/2021 Procedure   UPPER ENDOSCOPIC ULTRASOUND-By Dr. MRush [name] - A mass-like region was identified in the pancreatic head where the pancreatic duct dilates with multiple cystic regions noted throughout the pancreas as well. 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 However, the endosonographic appearance is suspicious for potential adenocarcinoma. This was staged T2 N0 Mx by endosonographic criteria. T - No malignant-appearing lymph nodes were visualized in the celiac region (level 20), peripancreatic region and porta hepatis region.   12/25/2021 Pathology Results   CYTOLOGY - NON PAP  CASE: [name] [name]   FINAL MICROSCOPIC DIAGNOSIS:  A. PANCREAS, HEAD, FINE NEEDLE ASPIRATION:  - Malignant cells consistent with adenocarcinoma     01/01/2022 Initial Diagnosis   Pancreatic cancer ([name]   01/13/2022 -  Chemotherapy   Patient is on Treatment Plan : PANCREAS Gemcitabine D1,15 q28d x 4 Cycles  01/26/2022 Cancer Staging   Staging form:  Exocrine Pancreas, AJCC 8th Edition - Clinical: Stage IB (cT2, cN0, cM0) - Signed by [name] Merle, MD on 01/26/2022 Total positive nodes: 0    Genetic Testing   [facility] CancerNext-Expanded Panel was Negative. Of note, a variant of uncertain significance was identified in the BLM gene (p.[id number]) and GALNT12 gene ([id number]). Report date is 01/26/2022.  The CancerNext-Expanded gene panel offered by [facility] and includes sequencing, rearrangement, and RNA analysis for the following 77 genes: AIP, ALK, APC, ATM, AXIN2, BAP1, BARD1, BLM, BMPR1A, BRCA1, BRCA2, BRIP1, CDC73, CDH1, CDK4, CDKN1B, CDKN2A, CHEK2, CTNNA1, DICER1, FANCC, FH, FLCN, GALNT12, KIF1B, LZTR1, MAX, MEN1, MET, MLH1, MSH2, MSH3, MSH6, MUTYH, NBN, NF1, NF2, NTHL1, PALB2, PHOX2B, PMS2, POT1, PRKAR1A, PTCH1, PTEN, RAD51C, RAD51D, RB1, RECQL, RET, SDHA, SDHAF2, SDHB, SDHC, SDHD, SMAD4, SMARCA4, SMARCB1, SMARCE1, STK11, SUFU, TMEM127, TP53, TSC1, TSC2, VHL and XRCC2 (sequencing and deletion/duplication); EGFR, EGLN1, HOXB13, KIT, MITF, PDGFRA, POLD1, and POLE (sequencing only); EPCAM and GREM1 (deletion/duplication only).      CURRENT THERAPY: Gemcitabine alone, q2weeks, started 01/13/22.   INTERVAL HISTORY: [name] [name] returns for follow-up and treatment as scheduled.  He was seen by my colleague Cassie, PA 02/10/2022 for cycle 3 gemcitabine which was postponed due to port infection.  He was seen by Dr. Zenia [name] who switched doxycycline to Bactrim, port was removed 02/13/2022 due to no improvement on oral antibiotics.  He was followed closely by surgery with visits on 6/12 and 6/16.  It was eventually felt that he had an allergy to the port itself rather than an infection, skin rash has been treated with triamcinolone.  He resumed cycle 3 single agent gemcitabine on 02/19/2022.  Most recently seen by Dr. Zenia [name] 03/03/2022.  He presents today with support person, [name] [name].  His port site is doing much better, less redness and itching.  He was  released from Dr. Zenia [name].  He experienced no burning with last gemcitabine with using a larger bore needle.  He has fatigue due to problems sleeping but he does not attribute much to chemo, he has no side effects really.  He is out of bed at home, eating and drinking.  He may use a boost once a day or every other day.  Denies nausea/vomiting.  Bowels moving normally.  He notes times where his abdomen feels larger and harder but this comes and goes.  Denies fever, chills, cough, chest pain, dyspnea, leg edema, neuropathy, or any other new specific complaints.  All other systems were reviewed with the patient and are negative.  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 ([place])    Prostate cancer metastatic to bone ([place])    REM sleep behavior disorder    Sleep apnea    BiPap    SURGICAL HISTORY: Past Surgical History:  Procedure Laterality Date   APPENDECTOMY  1963   BIOPSY  12/25/2021   Procedure: BIOPSY;  Surgeon: Irving Copas., MD;  Location: [facility];  Service: Gastroenterology;;   COLONOSCOPY  2010, 2015   ESOPHAGOGASTRODUODENOSCOPY (EGD) WITH PROPOFOL N/A 12/25/2021   Procedure: ESOPHAGOGASTRODUODENOSCOPY (EGD) WITH PROPOFOL;  Surgeon: Irving Copas., MD;  Location: [facility];  Service: Gastroenterology;  Laterality: N/A;   EUS  N/A 12/25/2021   Procedure: UPPER ENDOSCOPIC ULTRASOUND (EUS) RADIAL;  Surgeon: Irving Copas., MD;  Location: [facility];  Service: Gastroenterology;  Laterality: N/A;   FINE NEEDLE ASPIRATION  12/25/2021   Procedure: FINE NEEDLE ASPIRATION (FNA) LINEAR;  Surgeon: Irving Copas., MD;  Location[facility] ENDOSCOPY;  Service: Gastroenterology;;   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    KYPHOPLASTY N/A 08/15/2020   Procedure: L2 compression fracture;  Surgeon: Hessie Knows, MD;  Location: [facility];  Service: Orthopedics;  Laterality: N/A;   KYPHOPLASTY N/A 08/29/2020   Procedure: T8 KYPHOPLASTY;  Surgeon: Hessie Knows, MD;  Location: [facility];  Service: Orthopedics;  Laterality: N/A;   KYPHOPLASTY N/A 11/12/2020   Procedure: L1 KYPHOPLASTY;  Surgeon: Hessie Knows, MD;  Location: [facility];  Service: Orthopedics;  Laterality: N/A;   PORTACATH PLACEMENT N/A 02/05/2022   Procedure: INSERTION PORT-A-CATH;  Surgeon: [name] Bolt, MD;  Location: [facility];  Service: General;  Laterality: N/A;    I have reviewed the social history and family history with the patient and they are unchanged from previous note.  ALLERGIES:  is allergic to influenza vaccines.  MEDICATIONS:  Current Outpatient Medications  Medication Sig Dispense Refill   acetaminophen (TYLENOL) 325 MG tablet Take 325 mg by mouth every 6 (six) hours as needed for moderate pain.     apixaban (ELIQUIS) 5 MG TABS tablet Take 1 tablet (5 mg total) by mouth 2 (two) times daily. 60 tablet 1   carbidopa-levodopa (SINEMET CR) 50-200 MG tablet Take 1 tablet by mouth at bedtime. 90 tablet 0   carbidopa-levodopa (SINEMET IR) 25-100 MG tablet 3 tablets at 7 AM/2 at 11 AM/2 tablets at 3 PM/2 tablet at 7 PM 810 tablet 3   clonazePAM (KLONOPIN) 0.5 MG tablet Take 1 tablet by mouth at bedtime.     diltiazem (CARDIZEM) 30 MG tablet Take 1 tablet (30 mg total) by mouth 2 (two) times daily. 180 tablet 1   Glycerin-Hypromellose-PEG 400 (VISINE DRY EYE OP) Place 1 drop into both eyes 2 (two) times daily as needed (eye irritation).      ketoconazole (NIZORAL) 2 % shampoo Apply 1 application topically once a week.      leuprolide, 6 Month, (ELIGARD) 45 MG injection Inject 45 mg into the skin every 6 (six) months.     ondansetron (ZOFRAN) 8 MG tablet Take 1 tablet (8 mg total) by mouth 2 (two) times daily as needed (Nausea or vomiting). 30  tablet 1   prochlorperazine (COMPAZINE) 10 MG tablet Take 1 tablet (10 mg total) by mouth every 6 (six) hours as needed (Nausea or vomiting). 30 tablet 1   rosuvastatin (CRESTOR) 10 MG tablet Take 1 tablet (10 mg total) by mouth daily. 90 tablet 3   triamcinolone cream (KENALOG) 0.1 % Apply 1 application topically 2 (two) times daily. (Patient taking differently: Apply 1 application. topically 2 (two) times daily as needed (irritation).) 453.6 g 1   zoledronic acid (RECLAST) 5 MG/100ML SOLN injection Inject 5 mg into the vein once.     No current facility-administered medications for this visit.   Facility-Administered Medications Ordered in Other Visits  Medication Dose Route Frequency Provider Last Rate Last Admin   0.9 %  sodium chloride infusion   Intravenous Once [name] Merle, MD       gemcitabine (GEMZAR) 1,786 mg in sodium chloride 0.9 % 500 mL chemo infusion  1,000 mg/m2 (Treatment Plan Recorded) Intravenous Once [name] Merle, MD  PHYSICAL EXAMINATION: ECOG PERFORMANCE STATUS: 1 - Symptomatic but completely ambulatory  Vitals:   03/05/22 0906  BP: 102/75  Pulse: (!) 57  Resp: 15  Temp: (!) 97.5 F (36.4 C)  SpO2: 98%   Filed Weights   03/05/22 0906  Weight: 150 lb 14.4 oz (68.4 kg)    GENERAL:alert, no distress and comfortable SKIN: Faint erythema at the right chest port site, incision closed/healed with 1 retained suture EYES: sclera clear NECK: Without mass LUNGS: clear with normal breathing effort HEART: regular rate & rhythm, no lower extremity edema ABDOMEN:abdomen soft, non-tender and normal bowel sounds.  No palpable mass or significant ascites NEURO: alert & oriented x 3 with fluent speech  LABORATORY DATA:  I have reviewed the data as listed    Latest Ref Rng & Units 03/05/2022    8:45 AM 02/19/2022   11:11 AM 02/10/2022    2:26 PM  CBC  WBC 4.0 - 10.5 K/uL 4.7  9.1  5.2   Hemoglobin 13.0 - 17.0 g/dL 12.2  12.6  11.7   Hematocrit 39.0 - 52.0 % 37.4  38.3   35.9   Platelets 150 - 400 K/uL 126  418  142         Latest Ref Rng & Units 03/05/2022    8:45 AM 02/19/2022   11:11 AM 02/10/2022    2:26 PM  CMP  Glucose 70 - 99 mg/dL 84  100  93   BUN 8 - 23 mg/dL _0 Creatinine 0.61 - 1.24 mg/dL 1.14  1.41  1.40   Sodium 135 - 145 mmol/L 142  138  140   Potassium 3.5 - 5.1 mmol/L 3.9  4.6  4.1   Chloride 98 - 111 mmol/L 110  106  107   CO2 22 - 32 mmol/L _1 Calcium 8.9 - 10.3 mg/dL 9.7  9.8  9.8   Total Protein 6.5 - 8.1 g/dL 6.9  7.6  7.0   Total Bilirubin 0.3 - 1.2 mg/dL 0.9  0.7  0.7   Alkaline Phos 38 - 126 U/L 52  59  56   AST 15 - 41 U/L _2 ALT 0 - 44 U/L 6  <5  <5       RADIOGRAPHIC STUDIES: I have personally reviewed the radiological images as listed and agreed with the findings in the report. No results found.   ASSESSMENT & PLAN: [name] [name] is a 81 y.o. male with    1. Pancreatic adenocarcinoma (T2, N0, M0), stage I -presented with abdominal discomfort. CT AP on 10/02/21, and MRI/MRCP on 10/28/21 showed 1.8 cm mass in pancreatic head and uncinate. 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 Staged at T2 N0. -baseline CA 19-9 from 01/02/22 was 730. -he met with Dr. Zenia [name] and is felt not to be a good surgical candidate given his multiple medical comorbidities. -staging chest CT showed no definite evidence of metastatic disease. -he began single agent gemcitabine on 01/13/22, to be given every 2 weeks. Plan is for 3-4, or up to 6, months of treatment depending on tolerance, followed by consolidation radiation.  He has a [place] for fiducials scheduled 04/16/2022 -He had a port placed by Dr. Zenia [name] but quickly developed signs concerning for infection, he did not respond to antibiotics and it was ultimately felt  he had an allergy to the port itself and was removed by Dr. Zenia [name] 02/13/2022.  Dermatitis has nearly resolved -S/p 3  cycles of single agent gemcitabine, tolerating full dose without significant side effects.  He is able to recover and function at baseline -Labs stable with mild anemia and thrombocytopenia.  CA 19-9 decreased initially but increased on last level, today's CA 19-9 is pending.  Likely he appears stable and tolerating treatment well -Stage after 3 months chemo   2. Metastatic prostate cancer  -initially diagnosed with Gleason 4+5 prostate cancer in 04/2019 for elevated PSA of 26.5. Staging CT AP on 05/16/19 showed two suspicious left pelvic lesions. He was started on Eligard by Dr. Diamantina Providence in urology; receiving q68month, next due ~7/26.  -Patient also receives Reclast once a year. -he did not tolerate Zytiga -Last PSA drawn on 10/13/21 showed good response at 0.06.    3. Abdominal pain, bloating -Continue to monitor for now.  Symptoms currently controlled with Tylenol as needed. -He notes times where abdomen feels larger/harder, exam is negative for significant ascites.  I encouraged him to call back during these episodes to evaluate need for possible ultrasound/paracentesis  4. Family history of cancer  -despite having no family history of malignancy, he does qualify for genetic testing due to hi pancreatic cancer diagnosis. -testing performed on 01/13/22; results are pending.   5. 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 Sees Dr. TCarles Colletfrom neurology -Socially, [place]  He performs his ADLs.  He has a walker if he walks far distances.  The patient's son lives 10 minutes away -The patient has a solitary right kidney.  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  Has some level of kidney disease.    Disposition: Mr. WFlukeappears stable.  S/p 3 cycles of single agent gemcitabine.  He tolerates  well without significant side effects.  Less IV site burning with large bore needle, please continue.  He is able to recover and function at his baseline.  Exam is unremarkable. There is no clinical evidence of disease progression.  Labs reviewed, mild anemia and thrombocytopenia.  Last CA 19-9 had increased, level is pending from today.  Clinically I feel he is doing well.  Proceed with gemcitabine today as planned.  He is open to changing treatment to 2 weeks on/1 week off, may start from next cycle after he sees Dr. FBurr Medico  Follow-up and next chemo 7/12 as scheduled.  Likely restage early August before EUS, or sooner if needed if CA 19-9 continues to rise or he develops new concerns.    Orders Placed This Encounter  Procedures   CT CHEST ABDOMEN PELVIS W CONTRAST    Standing Status:   Future    Standing Expiration Date:   03/06/2023    Order Specific Question:   [facility] imaging [facility]?    [facility]   Order Specific Question:   Is Oral Contrast requested for this exam?    Answer:   Yes, Per Radiology protocol   All questions were answered. The patient knows to call the clinic with any problems, questions or concerns. No barriers to learning were detected.      LAlla Feeling NP 03/05/22

## 2022-03-04 DIAGNOSIS — L219 Seborrheic dermatitis, unspecified: Secondary | ICD-10-CM | POA: Diagnosis not present

## 2022-03-04 DIAGNOSIS — L853 Xerosis cutis: Secondary | ICD-10-CM | POA: Diagnosis not present

## 2022-03-04 DIAGNOSIS — D1801 Hemangioma of skin and subcutaneous tissue: Secondary | ICD-10-CM | POA: Diagnosis not present

## 2022-03-04 DIAGNOSIS — L814 Other melanin hyperpigmentation: Secondary | ICD-10-CM | POA: Diagnosis not present

## 2022-03-04 DIAGNOSIS — C44311 Basal cell carcinoma of skin of nose: Secondary | ICD-10-CM | POA: Diagnosis not present

## 2022-03-04 DIAGNOSIS — D229 Melanocytic nevi, unspecified: Secondary | ICD-10-CM | POA: Diagnosis not present

## 2022-03-05 ENCOUNTER — Encounter: Payer: Self-pay | Admitting: Nurse Practitioner

## 2022-03-05 ENCOUNTER — Inpatient Hospital Stay: Payer: Medicare Other

## 2022-03-05 ENCOUNTER — Other Ambulatory Visit: Payer: Self-pay

## 2022-03-05 ENCOUNTER — Inpatient Hospital Stay (HOSPITAL_BASED_OUTPATIENT_CLINIC_OR_DEPARTMENT_OTHER): Payer: Medicare Other | Admitting: Nurse Practitioner

## 2022-03-05 VITALS — BP 102/75 | HR 57 | Temp 97.5°F | Resp 15 | Wt 150.9 lb

## 2022-03-05 DIAGNOSIS — C250 Malignant neoplasm of head of pancreas: Secondary | ICD-10-CM

## 2022-03-05 DIAGNOSIS — Z5111 Encounter for antineoplastic chemotherapy: Secondary | ICD-10-CM | POA: Diagnosis not present

## 2022-03-05 DIAGNOSIS — G20 Parkinson's disease: Secondary | ICD-10-CM | POA: Diagnosis not present

## 2022-03-05 DIAGNOSIS — C61 Malignant neoplasm of prostate: Secondary | ICD-10-CM | POA: Diagnosis not present

## 2022-03-05 DIAGNOSIS — R0602 Shortness of breath: Secondary | ICD-10-CM | POA: Diagnosis not present

## 2022-03-05 DIAGNOSIS — C7951 Secondary malignant neoplasm of bone: Secondary | ICD-10-CM | POA: Diagnosis not present

## 2022-03-05 LAB — CBC WITH DIFFERENTIAL (CANCER CENTER ONLY)
Abs Immature Granulocytes: 0.01 10*3/uL (ref 0.00–0.07)
Basophils Absolute: 0 10*3/uL (ref 0.0–0.1)
Basophils Relative: 1 %
Eosinophils Absolute: 0.4 10*3/uL (ref 0.0–0.5)
Eosinophils Relative: 9 %
HCT: 37.4 % — ABNORMAL LOW (ref 39.0–52.0)
Hemoglobin: 12.2 g/dL — ABNORMAL LOW (ref 13.0–17.0)
Immature Granulocytes: 0 %
Lymphocytes Relative: 35 %
Lymphs Abs: 1.6 10*3/uL (ref 0.7–4.0)
MCH: 31.4 pg (ref 26.0–34.0)
MCHC: 32.6 g/dL (ref 30.0–36.0)
MCV: 96.4 fL (ref 80.0–100.0)
Monocytes Absolute: 0.7 10*3/uL (ref 0.1–1.0)
Monocytes Relative: 16 %
Neutro Abs: 1.8 10*3/uL (ref 1.7–7.7)
Neutrophils Relative %: 39 %
Platelet Count: 126 10*3/uL — ABNORMAL LOW (ref 150–400)
RBC: 3.88 MIL/uL — ABNORMAL LOW (ref 4.22–5.81)
RDW: 15.8 % — ABNORMAL HIGH (ref 11.5–15.5)
WBC Count: 4.7 10*3/uL (ref 4.0–10.5)
nRBC: 0 % (ref 0.0–0.2)

## 2022-03-05 LAB — CMP (CANCER CENTER ONLY)
ALT: 6 U/L (ref 0–44)
AST: 25 U/L (ref 15–41)
Albumin: 4.2 g/dL (ref 3.5–5.0)
Alkaline Phosphatase: 52 U/L (ref 38–126)
Anion gap: 6 (ref 5–15)
BUN: 15 mg/dL (ref 8–23)
CO2: 26 mmol/L (ref 22–32)
Calcium: 9.7 mg/dL (ref 8.9–10.3)
Chloride: 110 mmol/L (ref 98–111)
Creatinine: 1.14 mg/dL (ref 0.61–1.24)
GFR, Estimated: >60 mL/min (ref >60)
Glucose, Bld: 84 mg/dL (ref 70–99)
Potassium: 3.9 mmol/L (ref 3.5–5.1)
Sodium: 142 mmol/L (ref 135–145)
Total Bilirubin: 0.9 mg/dL (ref 0.3–1.2)
Total Protein: 6.9 g/dL (ref 6.5–8.1)

## 2022-03-05 MED ORDER — PROCHLORPERAZINE MALEATE 10 MG PO TABS
10.0000 mg | ORAL_TABLET | Freq: Once | ORAL | Status: AC
  Administered 2022-03-05: 10 mg via ORAL
  Filled 2022-03-05: qty 1

## 2022-03-05 MED ORDER — SODIUM CHLORIDE 0.9 % IV SOLN
1000.0000 mg/m2 | Freq: Once | INTRAVENOUS | Status: AC
  Administered 2022-03-05: 1786 mg via INTRAVENOUS
  Filled 2022-03-05: qty 46.97

## 2022-03-05 MED ORDER — SODIUM CHLORIDE 0.9 % IV SOLN: Freq: Once | INTRAVENOUS | Status: AC

## 2022-03-05 NOTE — Progress Notes (Signed)
Heat and extra IV fluids to IV site during Gemzar administration and per pt. no burning or irritation noted.

## 2022-03-05 NOTE — Patient Instructions (Signed)
[facility]  Discharge Instructions: Thank you for choosing [facility] to provide your oncology and hematology care.   If you have a lab appointment with the [facility], please go directly to the [facility] and check in at the registration area.   Wear comfortable clothing and clothing appropriate for easy access to any Portacath or PICC line.   We strive to give you quality time with your provider. You may need to reschedule your appointment if you arrive late (15 or more minutes).  Arriving late affects you and other patients whose appointments are after yours.  Also, if you miss three or more appointments without notifying the office, you may be dismissed from the clinic at the provider's discretion.      For prescription refill requests, have your pharmacy contact our office and allow 72 hours for refills to be completed.    Today you received the following chemotherapy and/or immunotherapy agent: Gemcitabine (Gemzar)     To help prevent nausea and vomiting after your treatment, we encourage you to take your nausea medication as directed.  BELOW ARE SYMPTOMS THAT SHOULD BE REPORTED IMMEDIATELY: *FEVER GREATER THAN 100.4 F (38 C) OR HIGHER *CHILLS OR SWEATING *NAUSEA AND VOMITING THAT IS NOT CONTROLLED WITH YOUR NAUSEA MEDICATION *UNUSUAL SHORTNESS OF BREATH *UNUSUAL BRUISING OR BLEEDING *URINARY PROBLEMS (pain or burning when urinating, or frequent urination) *BOWEL PROBLEMS (unusual diarrhea, constipation, pain near the anus) TENDERNESS IN MOUTH AND THROAT WITH OR WITHOUT PRESENCE OF ULCERS (sore throat, sores in mouth, or a toothache) UNUSUAL RASH, SWELLING OR PAIN  UNUSUAL VAGINAL DISCHARGE OR ITCHING   Items with * indicate a potential emergency and should be followed up as soon as possible or go to the Emergency Department if any problems should occur.  Please show the CHEMOTHERAPY ALERT CARD or IMMUNOTHERAPY ALERT CARD at  check-in to the Emergency Department and triage nurse.  Should you have questions after your visit or need to cancel or reschedule your appointment, please contact [facility]  Dept: [phone]  and follow the prompts.  Office hours are 8:00 a.m. to 4:30 p.m. Monday - Friday. Please note that voicemails left after 4:00 p.m. may not be returned until the following business day.  We are closed weekends and major holidays. You have access to a nurse at all times for urgent questions. Please call the main number to the clinic Dept: [phone] and follow the prompts.   For any non-urgent questions, you may also contact your provider using MyChart. We now offer e-Visits for anyone 27 and older to request care online for non-urgent symptoms. For details visit [email or web address].   Also download the MyChart app! Go to the app store, search "MyChart", open the app, select [facility], and log in with your MyChart username and password.  Masks are optional in the cancer centers. If you would like for your care team to wear a mask while they are taking care of you, please let them know. For doctor visits, patients may have with them one support person who is at least 81 years old. At this time, visitors are not allowed in the infusion area.

## 2022-03-06 ENCOUNTER — Other Ambulatory Visit: Payer: Self-pay | Admitting: Physician Assistant

## 2022-03-06 LAB — CANCER ANTIGEN 19-9: CA 19-9: 682 U/mL — ABNORMAL HIGH (ref 0–35)

## 2022-03-09 ENCOUNTER — Encounter: Payer: Self-pay | Admitting: Hematology

## 2022-03-11 ENCOUNTER — Ambulatory Visit: Payer: Medicare Other

## 2022-03-11 ENCOUNTER — Other Ambulatory Visit: Payer: Medicare Other

## 2022-03-11 ENCOUNTER — Telehealth: Payer: Self-pay

## 2022-03-11 NOTE — Telephone Encounter (Signed)
This nurse returned call to this patient related to message he left concerning pain in abdomen.  Patient states on 7/4 he was having really bad pain in his abdomen, rib cage and diaphragm area.  Stated that his abdomen was very hard.  Denied having shortness of breath.  Patient states today his pain is decreased a lot although there is som discomfort, his abdomen has softened some and still denies any shortness of breath.  Patient also denies constipation.  States that he was instructed by provider to give a call if this happens again. However, the clinic was closed on 7/4 so he called today.   This nurse informed him that this information would be forwarded to the provider for advisement.  No further concern at this time.

## 2022-03-13 ENCOUNTER — Other Ambulatory Visit: Payer: Self-pay

## 2022-03-13 ENCOUNTER — Other Ambulatory Visit: Payer: Self-pay | Admitting: Neurology

## 2022-03-13 ENCOUNTER — Telehealth: Payer: Self-pay | Admitting: Neurology

## 2022-03-13 DIAGNOSIS — G20 Parkinson's disease: Secondary | ICD-10-CM

## 2022-03-13 MED ORDER — CARBIDOPA-LEVODOPA ER 50-200 MG PO TBCR: 1.0000 | EXTENDED_RELEASE_TABLET | Freq: Every day | ORAL | 0 refills | Status: DC

## 2022-03-13 NOTE — Telephone Encounter (Signed)
Called patient and prescription has been sent in

## 2022-03-13 NOTE — Telephone Encounter (Signed)
Patient  is about out of his carbi/levo 50-200 ER tabs. He is asking for a 10 day supply locally till he gets his prescription in the mail. [facility] will be faxing a request for 90 days. He would like the 20 day supply to be sent to [facility].

## 2022-03-17 ENCOUNTER — Other Ambulatory Visit: Payer: Self-pay

## 2022-03-17 DIAGNOSIS — C61 Malignant neoplasm of prostate: Secondary | ICD-10-CM

## 2022-03-17 DIAGNOSIS — C259 Malignant neoplasm of pancreas, unspecified: Secondary | ICD-10-CM

## 2022-03-17 DIAGNOSIS — C250 Malignant neoplasm of head of pancreas: Secondary | ICD-10-CM

## 2022-03-17 NOTE — Progress Notes (Unsigned)
[name]   [phone] Fax:([phone]) [phone]   Clinic Follow up Note   Patient Care Team: [name] Cha, MD as PCP - General (Internal Medicine) [name] [name], [name] Quail, DO as Consulting Physician (Neurology) [name] Sickle, MD as Consulting Physician (Hematology and Oncology) [name] Bolt, MD as Consulting Physician (General Surgery)  Date of Service:  03/17/2022  CHIEF COMPLAINT: f/u of pancreatic cancer  CURRENT THERAPY:  Gemcitabine alone, q2weeks, started 01/13/22  ASSESSMENT & PLAN:  [name] [name] is a 81 y.o. male with   1. Pancreatic adenocarcinoma (T2, N0, Mx) -presented with abdominal discomfort. CT AP on 10/02/21, and MRI/MRCP on 10/28/21 showed 1.8 cm mass in pancreatic head and uncinate. 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 Staged at T2 N0. -baseline CA 19-9 from 01/02/22 was 730. -he met with Dr. Zenia [name] and is felt not to be a good surgical candidate given his multiple medical comorbidities. -staging chest CT showed no definite evidence of metastatic disease. -he began single agent gemcitabine on 01/13/22, to be given every 2 weeks. Plan is for 3-4, or up to 6, this will be followed by consolidation radiation.   2. Metastatic prostate cancer  -initially diagnosed with Gleason 4+5 prostate cancer in 04/2019 for elevated PSA of 26.5. Staging CT AP on 05/16/19 showed two suspicious left pelvic lesions. He was started on Eligard by Dr. Diamantina [name] in urology; receiving q9month, next due ~7/26.  -Patient also receives Reclast once a year. -he did not tolerate [name] [name] -Last PSA drawn on 10/13/21 showed good response at 0.06.    3. Abdominal pain -Continue to monitor for now.  Symptoms currently controlled with Tylenol as needed.  4. Family history of cancer  -despite having no family history of malignancy, he does qualify for genetic testing due to hi  pancreatic cancer diagnosis. -testing performed on 01/13/22; results are pending.   5. 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 Sees Dr. TCarles [name] neurology -Socially, the patient lives in independent living [name] [name]  He performs his ADLs.  He has a walker if he walks far distances.  The patient's son lives 10 minutes away -The patient has a solitary right kidney.  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  Has some level of kidney disease.    Plan:    No problem-specific Assessment & Plan notes found for this encounter.   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 Sclerotic 1.5 cm left ischial lesion/ Sclerotic medial left iliac bone 1.5 cm lesion (series 2/image 47), increased from 1.0 cm. Gleason score of 4+5= 9; with almost all cores involved greater than 80%.  9/16 Lupron [id number] on 9/16. [Urology; Dr.Siniski]  # MID OCT 2020- [name] [name] 1000 mg+ prednisone; stopped December 2021 [poor tolerance if with RVR]; DISCONTINUED.   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       [facility]   [facility]  [facility] ([facility] 10/02/2021 Imaging  CT ABDOMEN PELVIS W CONTRAST   IMPRESSION: 1. There is new pancreatic ductal dilation with an indeterminate 19 mm hypodense low-density mass area in the head/uncinate process of the pancreas. Recommend further evaluation with dedicated MRI/MRCP with contrast. 2. No evidence of bowel obstruction.  Moderate  rectal stool ball. 3. Scattered peripherally located clustered pulmonary nodules in the RIGHT middle lobe. Findings are likely infectious or inflammatory in etiology. Given history of malignancy, recommend follow-up as per clinical protocol.   10/28/2021 Imaging   MR ABDOMEN MRCP W WO CONTAST   IMPRESSION: 1. Examination is significantly limited by breath motion artifact throughout. 2. The main pancreatic duct is diffusely dilated from the level of the superior pancreatic head, measuring up to 0.7 cm. 3. In the inferior pancreatic head and uncinate, there is a multilobulated, fluid signal cystic lesion measuring 1.8 x 1.0 cm. Due to breath motion artifact it is difficult to determine whether this communicates to the adjacent duct. There are multiple additional subcentimeter cystic lesions scattered throughout the pancreas, several of which clearly communicate to the main pancreatic duct. 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 Recommend EUS/FNA for further diagnosis given the presence of pancreatic ductal dilatation. 4. Status post left nephrectomy. 5. No evidence of recurrent or metastatic disease in the abdomen.   12/25/2021 Procedure   UPPER ENDOSCOPIC ULTRASOUND-By Dr. Rush Landmark  - A mass-like region was identified in the pancreatic head where the pancreatic duct dilates with multiple cystic regions noted throughout the pancreas as well. 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 However, the endosonographic appearance is suspicious for potential adenocarcinoma. This was staged T2 N0 Mx by endosonographic criteria. T - No malignant-appearing lymph nodes were visualized in the celiac region (level 20), peripancreatic region and porta hepatis region.   12/25/2021 Pathology Results   CYTOLOGY - NON PAP  CASE: [id number]   FINAL MICROSCOPIC DIAGNOSIS:  A. PANCREAS, HEAD, FINE NEEDLE ASPIRATION:  -  Malignant cells consistent with adenocarcinoma     01/01/2022 Initial Diagnosis   Pancreatic cancer ([place])   01/13/2022 -  Chemotherapy   Patient is on Treatment Plan : PANCREAS Gemcitabine D1,15 q28d x 4 Cycles     01/26/2022 Cancer Staging   Staging form: Exocrine Pancreas, AJCC 8th Edition - Clinical: Stage IB (cT2, cN0, cM0) - Signed by Truitt Merle, MD on 01/26/2022 Total positive nodes: 0    Genetic Testing   [facility] CancerNext-Expanded Panel was Negative. Of note, a variant of uncertain significance was identified in the BLM gene (p.[id number]) and GALNT12 gene ([id number]). Report date is 01/26/2022.  The CancerNext-Expanded gene panel offered by [facility] and includes sequencing, rearrangement, and RNA analysis for the following 77 genes: AIP, ALK, APC, ATM, AXIN2, BAP1, BARD1, BLM, BMPR1A, BRCA1, BRCA2, BRIP1, CDC73, CDH1, CDK4, CDKN1B, CDKN2A, CHEK2, CTNNA1, DICER1, FANCC, FH, FLCN, GALNT12, KIF1B, LZTR1, MAX, MEN1, MET, MLH1, MSH2, MSH3, MSH6, MUTYH, NBN, NF1, NF2, NTHL1, PALB2, PHOX2B, PMS2, POT1, PRKAR1A, PTCH1, PTEN, RAD51C, RAD51D, RB1, RECQL, RET, SDHA, SDHAF2, SDHB, SDHC, SDHD, SMAD4, SMARCA4, SMARCB1, SMARCE1, STK11, SUFU, TMEM127, TP53, TSC1, TSC2, VHL and XRCC2 (sequencing and deletion/duplication); EGFR, EGLN1, HOXB13, KIT, MITF, PDGFRA, POLD1, and POLE (sequencing only); EPCAM and GREM1 (deletion/duplication only).       INTERVAL HISTORY:  [name] [name] is here for a follow up of pancreatic cancer. He was last seen by PA Cassie on 01/13/22. He presents to the clinic accompanied by his daughter. He reports he had fatigue following  infusion. He denies nausea. He reports some continued abdominal discomfort, which he feels is still present at this time. He explains, "sometimes my stomach feels like a bowling ball."   All other systems were reviewed with the patient and are negative.  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[place])    Prostate cancer metastatic to bone ([place])    REM sleep behavior disorder    Sleep apnea    BiPap    SURGICAL HISTORY: Past Surgical History:  Procedure Laterality Date   APPENDECTOMY  1963   BIOPSY  12/25/2021   Procedure: BIOPSY;  Surgeon: Irving Copas., MD;  Location: [place];  Service: Gastroenterology;;   COLONOSCOPY  2010, 2015   ESOPHAGOGASTRODUODENOSCOPY (EGD) WITH PROPOFOL N/A 12/25/2021   Procedure: ESOPHAGOGASTRODUODENOSCOPY (EGD) WITH PROPOFOL;  Surgeon: Irving Copas., MD;  Location: [place];  Service: Gastroenterology;  Laterality: N/A;   EUS N/A 12/25/2021   Procedure: UPPER ENDOSCOPIC ULTRASOUND (EUS) RADIAL;  Surgeon: Irving Copas., MD;  Location: [place];  Service: Gastroenterology;  Laterality: N/A;   FINE NEEDLE ASPIRATION  12/25/2021   Procedure: FINE NEEDLE ASPIRATION (FNA) LINEAR;  Surgeon: Irving Copas., MD;  [facility]COPY;  Service: Gastroenterology;;   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   KYPHOPLASTY N/A 08/15/2020   Procedure: L2 compression fracture;  Surgeon: Hessie Knows, MD;  Location: [facility];  Service: Orthopedics;  Laterality: N/A;   KYPHOPLASTY N/A 08/29/2020   Procedure: T8 KYPHOPLASTY;  Surgeon: Hessie Knows, MD;  Location: [facility];  Service: Orthopedics;  Laterality: N/A;   KYPHOPLASTY N/A 11/12/2020   Procedure: L1 KYPHOPLASTY;  Surgeon: Hessie Knows, MD;  Location: [facility];  Service: Orthopedics;  Laterality: N/A;   PORTACATH PLACEMENT N/A 02/05/2022   Procedure: INSERTION PORT-A-CATH;  Surgeon: [name] Bolt, MD;  Location: [facility];  Service: General;  Laterality: N/A;    I have reviewed the social history and family history with the patient and they are unchanged from previous  note.  ALLERGIES:  is allergic to influenza vaccines.  MEDICATIONS:  Current Outpatient Medications  Medication Sig Dispense Refill   acetaminophen (TYLENOL) 325 MG tablet Take 325 mg by mouth every 6 (six) hours as needed for moderate pain.     apixaban (ELIQUIS) 5 MG TABS tablet Take 1 tablet (5 mg total) by mouth 2 (two) times daily. 60 tablet 1   carbidopa-levodopa (SINEMET CR) 50-200 MG tablet TAKE 1 TABLET AT BEDTIME 90 tablet 1   carbidopa-levodopa (SINEMET IR) 25-100 MG tablet 3 tablets at 7 AM/2 at 11 AM/2 tablets at 3 PM/2 tablet at 7 PM 810 tablet 3   clonazePAM (KLONOPIN) 0.5 MG tablet Take 1 tablet by mouth at bedtime.     diltiazem (CARDIZEM) 30 MG tablet Take 1 tablet (30 mg total) by mouth 2 (two) times daily. 180 tablet 1   Glycerin-Hypromellose-PEG 400 (VISINE DRY EYE OP) Place 1 drop into both eyes 2 (two) times daily as needed (eye irritation).      ketoconazole (NIZORAL) 2 % shampoo Apply 1 application topically once a week.      leuprolide, 6 Month, (ELIGARD) 45 MG injection Inject 45 mg into the skin every 6 (six) months.  ondansetron (ZOFRAN) 8 MG tablet Take 1 tablet (8 mg total) by mouth 2 (two) times daily as needed (Nausea or vomiting). 30 tablet 1   prochlorperazine (COMPAZINE) 10 MG tablet Take 1 tablet (10 mg total) by mouth every 6 (six) hours as needed (Nausea or vomiting). 30 tablet 1   rosuvastatin (CRESTOR) 10 MG tablet Take 1 tablet (10 mg total) by mouth daily. 90 tablet 3   triamcinolone cream (KENALOG) 0.1 % Apply 1 application topically 2 (two) times daily. (Patient taking differently: Apply 1 application. topically 2 (two) times daily as needed (irritation).) 453.6 g 1   zoledronic acid (RECLAST) 5 MG/100ML SOLN injection Inject 5 mg into the vein once.     No current facility-administered medications for this visit.    PHYSICAL EXAMINATION: ECOG PERFORMANCE STATUS: 2 - Symptomatic, <50% confined to bed  There were no vitals filed for this  visit.  Wt Readings from Last 3 Encounters:  03/05/22 150 lb 14.4 oz (68.4 kg)  02/19/22 152 lb 9.6 oz (69.2 kg)  02/10/22 156 lb 1.6 oz (70.8 kg)     GENERAL:alert, no distress and comfortable SKIN: skin color normal, no rashes or significant lesions EYES: normal, Conjunctiva are pink and non-injected, sclera clear  NEURO: alert & oriented x 3 with fluent speech  LABORATORY DATA:  I have reviewed the data as listed    Latest Ref Rng & Units 03/05/2022    8:45 AM 02/19/2022   11:11 AM 02/10/2022    2:26 PM  CBC  WBC 4.0 - 10.5 K/uL 4.7  9.1  5.2   Hemoglobin 13.0 - 17.0 g/dL 12.2  12.6  11.7   Hematocrit 39.0 - 52.0 % 37.4  38.3  35.9   Platelets 150 - 400 K/uL 126  418  142         Latest Ref Rng & Units 03/05/2022    8:45 AM 02/19/2022   11:11 AM 02/10/2022    2:26 PM  CMP  Glucose 70 - 99 mg/dL 84  100  93   BUN 8 - 23 mg/dL 15  20  19    Creatinine 0.61 - 1.24 mg/dL 1.14  1.41  1.40   Sodium 135 - 145 mmol/L 142  138  140   Potassium 3.5 - 5.1 mmol/L 3.9  4.6  4.1   Chloride 98 - 111 mmol/L 110  106  107   CO2 22 - 32 mmol/L 26  25  28    Calcium 8.9 - 10.3 mg/dL 9.7  9.8  9.8   Total Protein 6.5 - 8.1 g/dL 6.9  7.6  7.0   Total Bilirubin 0.3 - 1.2 mg/dL 0.9  0.7  0.7   Alkaline Phos 38 - 126 U/L 52  59  56   AST 15 - 41 U/L 25  26  27    ALT 0 - 44 U/L 6  <5  <5       RADIOGRAPHIC STUDIES: I have personally reviewed the radiological images as listed and agreed with the findings in the report. No results found.    No orders of the defined types were placed in this encounter.  All questions were answered. The patient knows to call the clinic with any problems, questions or concerns. No barriers to learning was detected. The total time spent in the appointment was 30 minutes.     Truitt Merle, MD 03/17/2022

## 2022-03-18 ENCOUNTER — Inpatient Hospital Stay: Payer: Medicare Other

## 2022-03-18 ENCOUNTER — Inpatient Hospital Stay: Payer: Medicare Other | Attending: Internal Medicine | Admitting: Hematology

## 2022-03-18 ENCOUNTER — Other Ambulatory Visit: Payer: Self-pay

## 2022-03-18 ENCOUNTER — Encounter: Payer: Self-pay | Admitting: Hematology

## 2022-03-18 VITALS — BP 105/64 | HR 58 | Temp 97.7°F | Resp 16

## 2022-03-18 VITALS — BP 96/59 | HR 55 | Temp 97.5°F | Ht 66.0 in | Wt 153.5 lb

## 2022-03-18 DIAGNOSIS — R5383 Other fatigue: Secondary | ICD-10-CM | POA: Diagnosis not present

## 2022-03-18 DIAGNOSIS — C61 Malignant neoplasm of prostate: Secondary | ICD-10-CM | POA: Diagnosis not present

## 2022-03-18 DIAGNOSIS — Z5111 Encounter for antineoplastic chemotherapy: Secondary | ICD-10-CM | POA: Insufficient documentation

## 2022-03-18 DIAGNOSIS — C259 Malignant neoplasm of pancreas, unspecified: Secondary | ICD-10-CM

## 2022-03-18 DIAGNOSIS — Z7901 Long term (current) use of anticoagulants: Secondary | ICD-10-CM | POA: Diagnosis not present

## 2022-03-18 DIAGNOSIS — N400 Enlarged prostate without lower urinary tract symptoms: Secondary | ICD-10-CM | POA: Diagnosis not present

## 2022-03-18 DIAGNOSIS — C250 Malignant neoplasm of head of pancreas: Secondary | ICD-10-CM

## 2022-03-18 DIAGNOSIS — R10816 Epigastric abdominal tenderness: Secondary | ICD-10-CM | POA: Diagnosis not present

## 2022-03-18 DIAGNOSIS — Z887 Allergy status to serum and vaccine status: Secondary | ICD-10-CM | POA: Insufficient documentation

## 2022-03-18 DIAGNOSIS — Z79899 Other long term (current) drug therapy: Secondary | ICD-10-CM | POA: Insufficient documentation

## 2022-03-18 DIAGNOSIS — G20 Parkinson's disease: Secondary | ICD-10-CM | POA: Diagnosis not present

## 2022-03-18 DIAGNOSIS — C7951 Secondary malignant neoplasm of bone: Secondary | ICD-10-CM | POA: Diagnosis not present

## 2022-03-18 DIAGNOSIS — Z905 Acquired absence of kidney: Secondary | ICD-10-CM | POA: Insufficient documentation

## 2022-03-18 DIAGNOSIS — R109 Unspecified abdominal pain: Secondary | ICD-10-CM | POA: Insufficient documentation

## 2022-03-18 DIAGNOSIS — N183 Chronic kidney disease, stage 3 unspecified: Secondary | ICD-10-CM | POA: Insufficient documentation

## 2022-03-18 DIAGNOSIS — K861 Other chronic pancreatitis: Secondary | ICD-10-CM | POA: Diagnosis not present

## 2022-03-18 DIAGNOSIS — Z9049 Acquired absence of other specified parts of digestive tract: Secondary | ICD-10-CM | POA: Insufficient documentation

## 2022-03-18 DIAGNOSIS — G8929 Other chronic pain: Secondary | ICD-10-CM | POA: Diagnosis not present

## 2022-03-18 LAB — CBC WITH DIFFERENTIAL (CANCER CENTER ONLY)
Abs Immature Granulocytes: 0.03 10*3/uL (ref 0.00–0.07)
Basophils Absolute: 0 10*3/uL (ref 0.0–0.1)
Basophils Relative: 1 %
Eosinophils Absolute: 0.2 10*3/uL (ref 0.0–0.5)
Eosinophils Relative: 3 %
HCT: 33.6 % — ABNORMAL LOW (ref 39.0–52.0)
Hemoglobin: 11.3 g/dL — ABNORMAL LOW (ref 13.0–17.0)
Immature Granulocytes: 1 %
Lymphocytes Relative: 31 %
Lymphs Abs: 1.4 10*3/uL (ref 0.7–4.0)
MCH: 32.2 pg (ref 26.0–34.0)
MCHC: 33.6 g/dL (ref 30.0–36.0)
MCV: 95.7 fL (ref 80.0–100.0)
Monocytes Absolute: 0.6 10*3/uL (ref 0.1–1.0)
Monocytes Relative: 14 %
Neutro Abs: 2.2 10*3/uL (ref 1.7–7.7)
Neutrophils Relative %: 50 %
Platelet Count: 154 10*3/uL (ref 150–400)
RBC: 3.51 MIL/uL — ABNORMAL LOW (ref 4.22–5.81)
RDW: 16.6 % — ABNORMAL HIGH (ref 11.5–15.5)
WBC Count: 4.5 10*3/uL (ref 4.0–10.5)
nRBC: 0 % (ref 0.0–0.2)

## 2022-03-18 LAB — CMP (CANCER CENTER ONLY)
ALT: <5 U/L (ref 0–44)
AST: 26 U/L (ref 15–41)
Albumin: 4.1 g/dL (ref 3.5–5.0)
Alkaline Phosphatase: 48 U/L (ref 38–126)
Anion gap: 4 — ABNORMAL LOW (ref 5–15)
BUN: 17 mg/dL (ref 8–23)
CO2: 26 mmol/L (ref 22–32)
Calcium: 9.6 mg/dL (ref 8.9–10.3)
Chloride: 110 mmol/L (ref 98–111)
Creatinine: 1.15 mg/dL (ref 0.61–1.24)
GFR, Estimated: >60 mL/min (ref >60)
Glucose, Bld: 100 mg/dL — ABNORMAL HIGH (ref 70–99)
Potassium: 4.1 mmol/L (ref 3.5–5.1)
Sodium: 140 mmol/L (ref 135–145)
Total Bilirubin: 1 mg/dL (ref 0.3–1.2)
Total Protein: 6.8 g/dL (ref 6.5–8.1)

## 2022-03-18 MED ORDER — PROCHLORPERAZINE MALEATE 10 MG PO TABS
10.0000 mg | ORAL_TABLET | Freq: Once | ORAL | Status: AC
  Administered 2022-03-18: 10 mg via ORAL
  Filled 2022-03-18: qty 1

## 2022-03-18 MED ORDER — SODIUM CHLORIDE 0.9 % IV SOLN: Freq: Once | INTRAVENOUS | Status: AC

## 2022-03-18 MED ORDER — SODIUM CHLORIDE 0.9 % IV SOLN
1000.0000 mg/m2 | Freq: Once | INTRAVENOUS | Status: AC
  Administered 2022-03-18: 1786 mg via INTRAVENOUS
  Filled 2022-03-18: qty 46.97

## 2022-03-18 NOTE — Patient Instructions (Signed)
[facility]COL[facility]  Discharge Instructions: Thank you for choosing [facility] to provide your oncology and hematology care.   If you have a lab appointment with the Cancer Center, please go directly to the Cancer Center and check in at the registration area.   Wear comfortable clothing and clothing appropriate for easy access to any Portacath or PICC line.   We strive to give you quality time with your provider. You may need to reschedule your appointment if you arrive late (15 or more minutes).  Arriving late affects you and other patients whose appointments are after yours.  Also, if you miss three or more appointments without notifying the office, you may be dismissed from the clinic at the provider's discretion.      For prescription refill requests, have your pharmacy contact our office and allow 72 hours for refills to be completed.    Today you received the following chemotherapy and/or immunotherapy agents: Gemzar      To help prevent nausea and vomiting after your treatment, we encourage you to take your nausea medication as directed.  BELOW ARE SYMPTOMS THAT SHOULD BE REPORTED IMMEDIATELY: *FEVER GREATER THAN 100.4 F (38 C) OR HIGHER *CHILLS OR SWEATING *NAUSEA AND VOMITING THAT IS NOT CONTROLLED WITH YOUR NAUSEA MEDICATION *UNUSUAL SHORTNESS OF BREATH *UNUSUAL BRUISING OR BLEEDING *URINARY PROBLEMS (pain or burning when urinating, or frequent urination) *BOWEL PROBLEMS (unusual diarrhea, constipation, pain near the anus) TENDERNESS IN MOUTH AND THROAT WITH OR WITHOUT PRESENCE OF ULCERS (sore throat, sores in mouth, or a toothache) UNUSUAL RASH, SWELLING OR PAIN  UNUSUAL VAGINAL DISCHARGE OR ITCHING   Items with * indicate a potential emergency and should be followed up as soon as possible or go to the Emergency Department if any problems should occur.  Please show the CHEMOTHERAPY ALERT CARD or IMMUNOTHERAPY ALERT CARD at check-in to the  Emergency Department and triage nurse.  Should you have questions after your visit or need to cancel or reschedule your appointment, please contact [facility]  Dept: [phone]  and follow the prompts.  Office hours are 8:00 a.m. to 4:30 p.m. Monday - Friday. Please note that voicemails left after 4:00 p.m. may not be returned until the following business day.  We are closed weekends and major holidays. You have access to a nurse at all times for urgent questions. Please call the main number to the clinic Dept: [phone] and follow the prompts.   For any non-urgent questions, you may also contact your provider using MyChart. We now offer e-Visits for anyone 18 and older to request care online for non-urgent symptoms. For details visit [email or web address].   Also download the MyChart app! Go to the app store, search "MyChart", open the app, select [facility], and log in with your MyChart username and password.  Masks are optional in the cancer centers. If you would like for your care team to wear a mask while they are taking care of you, please let them know. For doctor visits, patients may have with them one support person who is at least 81 years old. At this time, visitors are not allowed in the infusion area. 

## 2022-03-18 NOTE — Progress Notes (Signed)
Extra fluids were ran concurrently with Pt's Gemzar infusion today for Pt comfort.

## 2022-03-19 ENCOUNTER — Other Ambulatory Visit: Payer: Self-pay

## 2022-03-19 LAB — CANCER ANTIGEN 19-9: CA 19-9: 699 U/mL — ABNORMAL HIGH (ref 0–35)

## 2022-03-20 DIAGNOSIS — B351 Tinea unguium: Secondary | ICD-10-CM | POA: Diagnosis not present

## 2022-03-20 DIAGNOSIS — L851 Acquired keratosis [keratoderma] palmaris et plantaris: Secondary | ICD-10-CM | POA: Diagnosis not present

## 2022-03-20 DIAGNOSIS — M792 Neuralgia and neuritis, unspecified: Secondary | ICD-10-CM | POA: Diagnosis not present

## 2022-03-20 DIAGNOSIS — L603 Nail dystrophy: Secondary | ICD-10-CM | POA: Diagnosis not present

## 2022-03-20 DIAGNOSIS — I739 Peripheral vascular disease, unspecified: Secondary | ICD-10-CM | POA: Diagnosis not present

## 2022-03-25 ENCOUNTER — Other Ambulatory Visit: Payer: Medicare Other

## 2022-03-25 ENCOUNTER — Ambulatory Visit: Payer: Medicare Other

## 2022-03-25 ENCOUNTER — Ambulatory Visit: Payer: Medicare Other | Admitting: Hematology

## 2022-03-25 DIAGNOSIS — C259 Malignant neoplasm of pancreas, unspecified: Secondary | ICD-10-CM | POA: Diagnosis not present

## 2022-03-25 DIAGNOSIS — G4752 REM sleep behavior disorder: Secondary | ICD-10-CM | POA: Diagnosis not present

## 2022-03-25 DIAGNOSIS — G4733 Obstructive sleep apnea (adult) (pediatric): Secondary | ICD-10-CM | POA: Diagnosis not present

## 2022-03-25 DIAGNOSIS — G20 Parkinson's disease: Secondary | ICD-10-CM | POA: Diagnosis not present

## 2022-03-30 ENCOUNTER — Other Ambulatory Visit: Payer: Self-pay

## 2022-03-30 DIAGNOSIS — Z905 Acquired absence of kidney: Secondary | ICD-10-CM | POA: Diagnosis not present

## 2022-03-30 DIAGNOSIS — R351 Nocturia: Secondary | ICD-10-CM | POA: Diagnosis not present

## 2022-03-30 DIAGNOSIS — C61 Malignant neoplasm of prostate: Secondary | ICD-10-CM | POA: Diagnosis not present

## 2022-03-30 DIAGNOSIS — C7951 Secondary malignant neoplasm of bone: Secondary | ICD-10-CM | POA: Diagnosis not present

## 2022-04-01 ENCOUNTER — Inpatient Hospital Stay: Payer: Medicare Other

## 2022-04-01 ENCOUNTER — Encounter: Payer: Self-pay | Admitting: Hematology

## 2022-04-01 ENCOUNTER — Other Ambulatory Visit: Payer: Self-pay

## 2022-04-01 ENCOUNTER — Inpatient Hospital Stay (HOSPITAL_BASED_OUTPATIENT_CLINIC_OR_DEPARTMENT_OTHER): Payer: Medicare Other | Admitting: Hematology

## 2022-04-01 VITALS — BP 91/56 | HR 56 | Temp 97.8°F | Resp 18

## 2022-04-01 VITALS — BP 103/65 | HR 57 | Temp 97.5°F | Wt 153.9 lb

## 2022-04-01 DIAGNOSIS — C250 Malignant neoplasm of head of pancreas: Secondary | ICD-10-CM | POA: Diagnosis not present

## 2022-04-01 DIAGNOSIS — M8088XA Other osteoporosis with current pathological fracture, vertebra(e), initial encounter for fracture: Secondary | ICD-10-CM

## 2022-04-01 DIAGNOSIS — C61 Malignant neoplasm of prostate: Secondary | ICD-10-CM

## 2022-04-01 DIAGNOSIS — C7951 Secondary malignant neoplasm of bone: Secondary | ICD-10-CM | POA: Diagnosis not present

## 2022-04-01 DIAGNOSIS — G20 Parkinson's disease: Secondary | ICD-10-CM | POA: Diagnosis not present

## 2022-04-01 DIAGNOSIS — R109 Unspecified abdominal pain: Secondary | ICD-10-CM | POA: Diagnosis not present

## 2022-04-01 DIAGNOSIS — C259 Malignant neoplasm of pancreas, unspecified: Secondary | ICD-10-CM

## 2022-04-01 DIAGNOSIS — Z5111 Encounter for antineoplastic chemotherapy: Secondary | ICD-10-CM | POA: Diagnosis not present

## 2022-04-01 LAB — CBC WITH DIFFERENTIAL (CANCER CENTER ONLY)
Abs Immature Granulocytes: 0.02 10*3/uL (ref 0.00–0.07)
Basophils Absolute: 0.1 10*3/uL (ref 0.0–0.1)
Basophils Relative: 1 %
Eosinophils Absolute: 0.2 10*3/uL (ref 0.0–0.5)
Eosinophils Relative: 5 %
HCT: 34.4 % — ABNORMAL LOW (ref 39.0–52.0)
Hemoglobin: 11.4 g/dL — ABNORMAL LOW (ref 13.0–17.0)
Immature Granulocytes: 1 %
Lymphocytes Relative: 37 %
Lymphs Abs: 1.5 10*3/uL (ref 0.7–4.0)
MCH: 32.6 pg (ref 26.0–34.0)
MCHC: 33.1 g/dL (ref 30.0–36.0)
MCV: 98.3 fL (ref 80.0–100.0)
Monocytes Absolute: 0.7 10*3/uL (ref 0.1–1.0)
Monocytes Relative: 18 %
Neutro Abs: 1.5 10*3/uL — ABNORMAL LOW (ref 1.7–7.7)
Neutrophils Relative %: 38 %
Platelet Count: 128 10*3/uL — ABNORMAL LOW (ref 150–400)
RBC: 3.5 MIL/uL — ABNORMAL LOW (ref 4.22–5.81)
RDW: 17.1 % — ABNORMAL HIGH (ref 11.5–15.5)
WBC Count: 4 10*3/uL (ref 4.0–10.5)
nRBC: 0 % (ref 0.0–0.2)

## 2022-04-01 LAB — CMP (CANCER CENTER ONLY)
ALT: <5 U/L (ref 0–44)
AST: 26 U/L (ref 15–41)
Albumin: 4.2 g/dL (ref 3.5–5.0)
Alkaline Phosphatase: 54 U/L (ref 38–126)
Anion gap: 5 (ref 5–15)
BUN: 17 mg/dL (ref 8–23)
CO2: 27 mmol/L (ref 22–32)
Calcium: 9.6 mg/dL (ref 8.9–10.3)
Chloride: 109 mmol/L (ref 98–111)
Creatinine: 1.21 mg/dL (ref 0.61–1.24)
GFR, Estimated: >60 mL/min (ref >60)
Glucose, Bld: 84 mg/dL (ref 70–99)
Potassium: 4 mmol/L (ref 3.5–5.1)
Sodium: 141 mmol/L (ref 135–145)
Total Bilirubin: 0.9 mg/dL (ref 0.3–1.2)
Total Protein: 7 g/dL (ref 6.5–8.1)

## 2022-04-01 MED ORDER — PROCHLORPERAZINE MALEATE 10 MG PO TABS
10.0000 mg | ORAL_TABLET | Freq: Once | ORAL | Status: AC
  Administered 2022-04-01: 10 mg via ORAL
  Filled 2022-04-01: qty 1

## 2022-04-01 MED ORDER — SODIUM CHLORIDE 0.9 % IV SOLN: Freq: Once | INTRAVENOUS | Status: AC

## 2022-04-01 MED ORDER — SODIUM CHLORIDE 0.9 % IV SOLN: INTRAVENOUS | Status: DC

## 2022-04-01 MED ORDER — SODIUM CHLORIDE 0.9 % IV SOLN
1000.0000 mg/m2 | Freq: Once | INTRAVENOUS | Status: AC
  Administered 2022-04-01: 1786 mg via INTRAVENOUS
  Filled 2022-04-01: qty 46.97

## 2022-04-01 MED ORDER — LEUPROLIDE ACETATE (6 MONTH) 45 MG ~~LOC~~ KIT
45.0000 mg | PACK | Freq: Once | SUBCUTANEOUS | Status: DC
  Filled 2022-04-01: qty 45

## 2022-04-01 NOTE — Patient Instructions (Addendum)
[facility]  Discharge Instructions: Thank you for choosing [facility] to provide your oncology and hematology care.   If you have a lab appointment with the [facility], please go directly to the [facility] and check in at the registration area.   Wear comfortable clothing and clothing appropriate for easy access to any Portacath or PICC line.   We strive to give you quality time with your provider. You may need to reschedule your appointment if you arrive late (15 or more minutes).  Arriving late affects you and other patients whose appointments are after yours.  Also, if you miss three or more appointments without notifying the office, you may be dismissed from the clinic at the provider's discretion.      For prescription refill requests, have your pharmacy contact our office and allow 72 hours for refills to be completed.    Today you received the following chemotherapy and/or immunotherapy agents: Gemcitabine.      To help prevent nausea and vomiting after your treatment, we encourage you to take your nausea medication as directed.  BELOW ARE SYMPTOMS THAT SHOULD BE REPORTED IMMEDIATELY: *FEVER GREATER THAN 100.4 F (38 C) OR HIGHER *CHILLS OR SWEATING *NAUSEA AND VOMITING THAT IS NOT CONTROLLED WITH YOUR NAUSEA MEDICATION *UNUSUAL SHORTNESS OF BREATH *UNUSUAL BRUISING OR BLEEDING *URINARY PROBLEMS (pain or burning when urinating, or frequent urination) *BOWEL PROBLEMS (unusual diarrhea, constipation, pain near the anus) TENDERNESS IN MOUTH AND THROAT WITH OR WITHOUT PRESENCE OF ULCERS (sore throat, sores in mouth, or a toothache) UNUSUAL RASH, SWELLING OR PAIN  UNUSUAL VAGINAL DISCHARGE OR ITCHING   Items with * indicate a potential emergency and should be followed up as soon as possible or go to the Emergency Department if any problems should occur.  Please show the CHEMOTHERAPY ALERT CARD or IMMUNOTHERAPY ALERT CARD at check-in  to the Emergency Department and triage nurse.  Should you have questions after your visit or need to cancel or reschedule your appointment, please contact [facility]  Dept: [phone]  and follow the prompts.  Office hours are 8:00 a.m. to 4:30 p.m. Monday - Friday. Please note that voicemails left after 4:00 p.m. may not be returned until the following business day.  We are closed weekends and major holidays. You have access to a nurse at all times for urgent questions. Please call the main number to the clinic Dept: [phone] and follow the prompts.   For any non-urgent questions, you may also contact your provider using MyChart. We now offer e-Visits for anyone 31 and older to request care online for non-urgent symptoms. For details visit [email or web address].   Also download the MyChart app! Go to the app store, search "MyChart", open the app, select [facility], and log in with your MyChart username and password.  Masks are optional in the cancer centers. If you would like for your care team to wear a mask while they are taking care of you, please let them know. For doctor visits, patients may have with them one support person who is at least 81 years old. At this time, visitors are not allowed in the infusion area.

## 2022-04-01 NOTE — Progress Notes (Addendum)
[facility]   [phone] Fax:([phone]   Clinic Follow up Note   Patient Care Team: [name], MD as PCP - General (Internal Medicine) Tat, Eustace Quail, DO as Consulting Physician (Neurology) Cammie Sickle, MD as Consulting Physician (Hematology and Oncology) Dwan Bolt, MD as Consulting Physician (General Surgery) Truitt Merle, MD as Consulting Physician (Hematology and Oncology)  Date of Service:  04/01/2022  CHIEF COMPLAINT: f/u of pancreatic cancer  CURRENT THERAPY:  Gemcitabine alone, q2weeks, started 01/13/22  ASSESSMENT & PLAN:  [name] [name] is a 81 y.o. male with   1. Pancreatic adenocarcinoma (T2, N0, Mx) -presented with abdominal discomfort. CT AP on 10/02/21, and MRI/MRCP on 10/28/21 showed 1.8 cm mass in pancreatic head and uncinate. 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 Staged at T2 N0. -baseline CA 19-9 from 01/02/22 was 730. -he met with Dr. Zenia Resides and is felt not to be a good surgical candidate given his multiple medical comorbidities. -staging chest CT showed no definite evidence of metastatic disease. -he began single agent gemcitabine on 01/13/22, to be given every 2 weeks. Plan is for 3-4, or up to 6 months, this will be followed by consolidation radiation. -He is tolerating low-dose gemcitabine well, with mild fatigue. Labs reviewed, will proceed with final gemcitabine dose today. -He is scheduled for CT CAP on 04/09/22 and fiducial placement 04/16/22, will call him with the scan results  -He is scheduled to see radiation oncologist Dr. [name] on August 15, and the plan to proceed with radiation after if no evidence of metastatic disease on CT scan.   2. Metastatic prostate cancer  -initially diagnosed with Gleason 4+5 prostate cancer in 04/2019 for elevated PSA of 26.5. Staging CT AP on 05/16/19 showed two suspicious left  pelvic lesions. He was started on Eligard by Dr. Diamantina Providence in urology; receiving q52month, next due ~7/26.  -Patient also receives Reclast once a year. -he did not tolerate Zytiga -Last PSA drawn on 10/13/21 showed good response at 0.06.  -due for Eligard, will give it today    3. Abdominal pain -Continue to monitor for now.  Symptoms currently controlled with Tylenol as needed.  4. Family history of cancer  -despite having no family history of malignancy, he does qualify for genetic testing due to hi pancreatic cancer diagnosis. -testing performed on 01/13/22; results were negative, with VUS in BLM and GALNT12.   5. 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 Sees Dr. TCarles Colletfrom neurology -Socially, the patient lives [place] independent [place]  He performs his ADLs.  He has a walker if he walks far distances.  The patient's son lives 10 minutes away -The patient has a solitary right kidney.  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  Has some level of kidney disease.     Plan:  -proceed cycle 6 gemcitabine and Eligard injection today  -post-treatment CT CAP 8/3, will call him with results  -consult with Dr. MLisbeth Renshaw8/8 -EUS/fiducial marker placement 8/10 -CT sim 8/15 -lab and f/u in 6 weeks    No problem-specific Assessment & Plan notes found for this encounter.   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 Sclerotic 1.5 cm left ischial lesion/ Sclerotic medial  left iliac bone 1.5 cm lesion (series 2/image 47), increased from 1.0 cm. Gleason score of 4+5= 9; with almost all cores involved greater than 80%.  9/16 Lupron 64-monthdepot on 9/16. [Urology; Dr.Siniski]  # MID OCT 2020- Zytiga 1000 mg+ prednisone; stopped December 2021 [poor tolerance if with  RVR]; DISCONTINUED.   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       Prostate cancer metastatic to bone ([place]  05/24/2019 Initial Diagnosis   Prostate cancer metastatic to bone ([facility]   Pancreatic cancer ([place]  10/02/2021 Imaging   CT ABDOMEN PELVIS W CONTRAST   IMPRESSION: 1. There is new pancreatic ductal dilation with an indeterminate 19 mm hypodense low-density mass area in the head/uncinate process of the pancreas. Recommend further evaluation with dedicated MRI/MRCP with contrast. 2. No evidence of bowel obstruction.  Moderate rectal stool ball. 3. Scattered peripherally located clustered pulmonary nodules in the RIGHT middle lobe. Findings are likely infectious or inflammatory in etiology. Given history of malignancy, recommend follow-up as per clinical protocol.   10/28/2021 Imaging   MR ABDOMEN MRCP W WO CONTAST   IMPRESSION: 1. Examination is significantly limited by breath motion artifact throughout. 2. The main pancreatic duct is diffusely dilated from the level of the superior pancreatic head, measuring up to 0.7 cm. 3. In the inferior pancreatic head and uncinate, there is a multilobulated, fluid signal cystic lesion measuring 1.8 x 1.0 cm. Due to breath motion artifact it is difficult to determine whether this communicates to the adjacent duct. There are multiple additional subcentimeter cystic lesions scattered throughout the pancreas, several of which clearly communicate to the main pancreatic duct. 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 Recommend EUS/FNA for further diagnosis given the presence of pancreatic ductal dilatation. 4. Status post left nephrectomy. 5. No evidence of recurrent or metastatic disease in  the abdomen.   12/25/2021 Procedure   UPPER ENDOSCOPIC ULTRASOUND-By Dr. MRush Landmark - A mass-like region was identified in the pancreatic head where the pancreatic duct dilates with multiple cystic regions noted throughout the pancreas as well. 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 However, the endosonographic appearance is suspicious for potential adenocarcinoma. This was staged T2 N0 Mx by endosonographic criteria. T - No malignant-appearing lymph nodes were visualized in the celiac region (level 20), peripancreatic region and porta hepatis region.   12/25/2021 Pathology Results   CYTOLOGY - NON PAP  CASE: [id number]   FINAL MICROSCOPIC DIAGNOSIS:  A. PANCREAS, HEAD, FINE NEEDLE ASPIRATION:  - Malignant cells consistent with adenocarcinoma     01/01/2022 Initial Diagnosis   Pancreatic cancer ([facility]   01/13/2022 -  Chemotherapy   Patient is on Treatment Plan : PANCREAS Gemcitabine D1,15 q28d x 4 Cycles     01/26/2022 Cancer Staging   Staging form: Exocrine Pancreas, AJCC 8th Edition - Clinical: Stage IB (cT2, cN0, cM0) - Signed by FTruitt Merle MD on 01/26/2022 Total positive nodes: 0    Genetic Testing   [facility] CancerNext-Expanded Panel was Negative. Of note, a variant of uncertain significance was identified in the BLM gene (p.N936D) and GALNT12 gene ([id number]). Report date is 01/26/2022.  The CancerNext-Expanded gene panel offered by [facility] includes sequencing, rearrangement, and RNA analysis for the following 77 genes: AIP,  ALK, APC, ATM, AXIN2, BAP1, BARD1, BLM, BMPR1A, BRCA1, BRCA2, BRIP1, CDC73, CDH1, CDK4, CDKN1B, CDKN2A, CHEK2, CTNNA1, DICER1, FANCC, FH, FLCN, GALNT12, KIF1B, LZTR1, MAX, MEN1, MET, MLH1, MSH2, MSH3, MSH6, MUTYH, NBN, NF1, NF2, NTHL1, PALB2, PHOX2B, PMS2, POT1, PRKAR1A, PTCH1, PTEN, RAD51C, RAD51D, RB1, RECQL, RET, SDHA, SDHAF2, SDHB, SDHC, SDHD, SMAD4, SMARCA4, SMARCB1, SMARCE1, STK11, SUFU, TMEM127, TP53,  TSC1, TSC2, VHL and XRCC2 (sequencing and deletion/duplication); EGFR, EGLN1, HOXB13, KIT, MITF, PDGFRA, POLD1, and POLE (sequencing only); EPCAM and GREM1 (deletion/duplication only).       INTERVAL HISTORY:  [name] [name] is here for a follow up of pancreatic cancer. He was last seen by me on 03/17/22. He presents to the clinic accompanied by his daughter. He reports he is stable.  He notes some recent issues sleeping, relieved with benadryl.   All other systems were reviewed with the patient and are negative.  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[place])    Prostate cancer metastatic to bone ([place])    REM sleep behavior disorder    Sleep apnea    BiPap    SURGICAL HISTORY: Past Surgical History:  Procedure Laterality Date   APPENDECTOMY  1963   BIOPSY  12/25/2021   Procedure: BIOPSY;  Surgeon: Irving Copas., MD;  Location: [facility];  Service: Gastroenterology;;   COLONOSCOPY  2010, 2015   ESOPHAGOGASTRODUODENOSCOPY (EGD) WITH PROPOFOL N/A 12/25/2021   Procedure: ESOPHAGOGASTRODUODENOSCOPY (EGD) WITH PROPOFOL;  Surgeon: Irving Copas., MD;  Location: [facility];  Service: Gastroenterology;  Laterality: N/A;   EUS N/A 12/25/2021   Procedure: UPPER ENDOSCOPIC ULTRASOUND (EUS) RADIAL;  Surgeon: Irving Copas., MD;  Location: [facility];  Service: Gastroenterology;  Laterality: N/A;   FINE NEEDLE ASPIRATION  12/25/2021   Procedure: FINE NEEDLE ASPIRATION (FNA) LINEAR;  Surgeon: Irving Copas., MD;  Location[facility] ENDOSCOPY;  Service: Gastroenterology;;   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   KYPHOPLASTY N/A 08/15/2020   Procedure: L2 compression fracture;  Surgeon: Hessie Knows, MD;  Location: [facility]  ORS;  Service: Orthopedics;  Laterality: N/A;   KYPHOPLASTY N/A 08/29/2020   Procedure: T8 KYPHOPLASTY;  Surgeon: Hessie Knows, MD;  Location: [facility] ORS;  Service: Orthopedics;  Laterality: N/A;   KYPHOPLASTY N/A 11/12/2020   Procedure: L1 KYPHOPLASTY;  Surgeon: Hessie Knows, MD;  Location: [facility] ORS;  Service: Orthopedics;  Laterality: N/A;   PORTACATH PLACEMENT N/A 02/05/2022   Procedure: INSERTION PORT-A-CATH;  Surgeon: Dwan Bolt, MD;  Location: [facility] ORS;  Service: General;  Laterality: N/A;    I have reviewed the social history and family history with the patient and they are unchanged from previous note.  ALLERGIES:  is allergic to influenza vaccines.  MEDICATIONS:  Current Outpatient Medications  Medication Sig Dispense Refill   acetaminophen (TYLENOL) 325 MG tablet Take 325 mg by mouth every 6 (six) hours as needed for moderate pain.     apixaban (ELIQUIS) 5 MG TABS tablet Take 1 tablet (5 mg total) by mouth 2 (two) times daily. 60 tablet 1   carbidopa-levodopa (SINEMET CR) 50-200 MG tablet TAKE 1 TABLET AT BEDTIME 90 tablet 1   carbidopa-levodopa (SINEMET IR) 25-100 MG tablet 3 tablets at 7 AM/2 at 11 AM/2 tablets at 3  PM/2 tablet at 7 PM 810 tablet 3   clonazePAM (KLONOPIN) 0.5 MG tablet Take 1 tablet by mouth at bedtime.     diltiazem (CARDIZEM) 30 MG tablet Take 1 tablet (30 mg total) by mouth 2 (two) times daily. 180 tablet 1   Glycerin-Hypromellose-PEG 400 (VISINE DRY EYE OP) Place 1 drop into both eyes 2 (two) times daily as needed (eye irritation).      ketoconazole (NIZORAL) 2 % shampoo Apply 1 application topically once a week.      leuprolide, 6 Month, (ELIGARD) 45 MG injection Inject 45 mg into the skin every 6 (six) months.     ondansetron (ZOFRAN) 8 MG tablet Take 1 tablet (8 mg total) by mouth 2 (two) times daily as needed (Nausea or vomiting). 30 tablet 1   prochlorperazine (COMPAZINE) 10 MG tablet Take 1 tablet (10 mg total) by mouth every 6 (six) hours as needed  (Nausea or vomiting). 30 tablet 1   rosuvastatin (CRESTOR) 10 MG tablet Take 1 tablet (10 mg total) by mouth daily. 90 tablet 3   triamcinolone cream (KENALOG) 0.1 % Apply 1 application topically 2 (two) times daily. (Patient taking differently: Apply 1 application. topically 2 (two) times daily as needed (irritation).) 453.6 g 1   zoledronic acid (RECLAST) 5 MG/100ML SOLN injection Inject 5 mg into the vein once.     No current facility-administered medications for this visit.   Facility-Administered Medications Ordered in Other Visits  Medication Dose Route Frequency Provider Last Rate Last Admin   0.9 %  sodium chloride infusion   Intravenous Continuous Truitt Merle, MD       gemcitabine (GEMZAR) 1,786 mg in sodium chloride 0.9 % 500 mL chemo infusion  1,000 mg/m2 (Treatment Plan Recorded) Intravenous Once Truitt Merle, MD        PHYSICAL EXAMINATION: ECOG PERFORMANCE STATUS: 2 - Symptomatic, <50% confined to bed  Vitals:   04/01/22 0947  BP: 103/65  Pulse: (!) 57  Temp: (!) 97.5 F (36.4 C)  SpO2: 98%   Wt Readings from Last 3 Encounters:  04/01/22 153 lb 14.4 oz (69.8 kg)  03/18/22 153 lb 8 oz (69.6 kg)  03/05/22 150 lb 14.4 oz (68.4 kg)     GENERAL:alert, no distress and comfortable SKIN: skin color normal, no rashes or significant lesions EYES: normal, Conjunctiva are pink and non-injected, sclera clear  NEURO: alert & oriented x 3 with fluent speech  LABORATORY DATA:  I have reviewed the data as listed    Latest Ref Rng & Units 04/01/2022    9:31 AM 03/18/2022   11:21 AM 03/05/2022    8:45 AM  CBC  WBC 4.0 - 10.5 K/uL 4.0  4.5  4.7   Hemoglobin 13.0 - 17.0 g/dL 11.4  11.3  12.2   Hematocrit 39.0 - 52.0 % 34.4  33.6  37.4   Platelets 150 - 400 K/uL 128  154  126         Latest Ref Rng & Units 04/01/2022    9:31 AM 03/18/2022   11:21 AM 03/05/2022    8:45 AM  CMP  Glucose 70 - 99 mg/dL 84  100  84   BUN 8 - 23 mg/dL _0 Creatinine 0.61 - 1.24 mg/dL 1.21   1.15  1.14   Sodium 135 - 145 mmol/L 141  140  142   Potassium 3.5 - 5.1 mmol/L 4.0  4.1  3.9   Chloride 98 - 111 mmol/L  109  110  110   CO2 22 - 32 mmol/L _0 Calcium 8.9 - 10.3 mg/dL 9.6  9.6  9.7   Total Protein 6.5 - 8.1 g/dL 7.0  6.8  6.9   Total Bilirubin 0.3 - 1.2 mg/dL 0.9  1.0  0.9   Alkaline Phos 38 - 126 U/L 54  48  52   AST 15 - 41 U/L _1 ALT 0 - 44 U/L <5  <5  6       RADIOGRAPHIC STUDIES: I have personally reviewed the radiological images as listed and agreed with the findings in the report. No results found.    No orders of the defined types were placed in this encounter.  All questions were answered. The patient knows to call the clinic with any problems, questions or concerns. No barriers to learning was detected. The total time spent in the appointment was 30 minutes.     Truitt Merle, MD 04/01/2022   I, Wilburn Mylar, am acting as scribe for Truitt Merle, MD.   I have reviewed the above documentation for accuracy and completeness, and I agree with the above.

## 2022-04-01 NOTE — Progress Notes (Signed)
Pt due for Eligard today per Burr Medico MD. Pt states he would prefer to have this mediation administered in two weeks at his Urologist's office. Medication sent back to pharmacy.   Pt tolerated Gemcitabine infusion through PIV. Fluids were run at rate to match Gemcitabine infusion rate. Pt reported no pain or discomfort.

## 2022-04-02 LAB — CANCER ANTIGEN 19-9: CA 19-9: 683 U/mL — ABNORMAL HIGH (ref 0–35)

## 2022-04-03 ENCOUNTER — Encounter: Payer: Self-pay | Admitting: Internal Medicine

## 2022-04-03 ENCOUNTER — Telehealth: Payer: Self-pay | Admitting: Hematology

## 2022-04-03 ENCOUNTER — Encounter: Payer: Self-pay | Admitting: Hematology

## 2022-04-03 NOTE — Telephone Encounter (Signed)
Scheduled follow-up appointment per 7/26 los. Patient is aware. 

## 2022-04-08 ENCOUNTER — Ambulatory Visit: Payer: Medicare Other | Admitting: Hematology

## 2022-04-08 ENCOUNTER — Other Ambulatory Visit: Payer: Medicare Other

## 2022-04-08 ENCOUNTER — Ambulatory Visit: Payer: Medicare Other

## 2022-04-09 ENCOUNTER — Other Ambulatory Visit: Payer: Self-pay

## 2022-04-09 ENCOUNTER — Ambulatory Visit (HOSPITAL_COMMUNITY)
Admission: RE | Admit: 2022-04-09 | Discharge: 2022-04-09 | Disposition: A | Discharge status: Home / Self Care | Payer: Medicare Other | Source: Ambulatory Visit | Attending: Nurse Practitioner | Admitting: Nurse Practitioner

## 2022-04-09 ENCOUNTER — Inpatient Hospital Stay: Payer: Medicare Other | Attending: Internal Medicine

## 2022-04-09 ENCOUNTER — Encounter (HOSPITAL_COMMUNITY): Payer: Self-pay | Admitting: Gastroenterology

## 2022-04-09 DIAGNOSIS — N400 Enlarged prostate without lower urinary tract symptoms: Secondary | ICD-10-CM | POA: Insufficient documentation

## 2022-04-09 DIAGNOSIS — R918 Other nonspecific abnormal finding of lung field: Secondary | ICD-10-CM | POA: Diagnosis not present

## 2022-04-09 DIAGNOSIS — Z905 Acquired absence of kidney: Secondary | ICD-10-CM | POA: Insufficient documentation

## 2022-04-09 DIAGNOSIS — G20 Parkinson's disease: Secondary | ICD-10-CM | POA: Diagnosis not present

## 2022-04-09 DIAGNOSIS — I700 Atherosclerosis of aorta: Secondary | ICD-10-CM | POA: Diagnosis not present

## 2022-04-09 DIAGNOSIS — R109 Unspecified abdominal pain: Secondary | ICD-10-CM | POA: Diagnosis not present

## 2022-04-09 DIAGNOSIS — C61 Malignant neoplasm of prostate: Secondary | ICD-10-CM | POA: Diagnosis not present

## 2022-04-09 DIAGNOSIS — Z7901 Long term (current) use of anticoagulants: Secondary | ICD-10-CM | POA: Diagnosis not present

## 2022-04-09 DIAGNOSIS — N183 Chronic kidney disease, stage 3 unspecified: Secondary | ICD-10-CM | POA: Diagnosis not present

## 2022-04-09 DIAGNOSIS — Z887 Allergy status to serum and vaccine status: Secondary | ICD-10-CM | POA: Insufficient documentation

## 2022-04-09 DIAGNOSIS — J984 Other disorders of lung: Secondary | ICD-10-CM | POA: Diagnosis not present

## 2022-04-09 DIAGNOSIS — C7951 Secondary malignant neoplasm of bone: Secondary | ICD-10-CM | POA: Diagnosis not present

## 2022-04-09 DIAGNOSIS — Z9049 Acquired absence of other specified parts of digestive tract: Secondary | ICD-10-CM | POA: Diagnosis not present

## 2022-04-09 DIAGNOSIS — K7689 Other specified diseases of liver: Secondary | ICD-10-CM | POA: Diagnosis not present

## 2022-04-09 DIAGNOSIS — G8929 Other chronic pain: Secondary | ICD-10-CM | POA: Insufficient documentation

## 2022-04-09 DIAGNOSIS — Z5111 Encounter for antineoplastic chemotherapy: Secondary | ICD-10-CM | POA: Diagnosis present

## 2022-04-09 DIAGNOSIS — K861 Other chronic pancreatitis: Secondary | ICD-10-CM | POA: Diagnosis not present

## 2022-04-09 DIAGNOSIS — K862 Cyst of pancreas: Secondary | ICD-10-CM | POA: Diagnosis not present

## 2022-04-09 DIAGNOSIS — Z79899 Other long term (current) drug therapy: Secondary | ICD-10-CM | POA: Insufficient documentation

## 2022-04-09 DIAGNOSIS — C259 Malignant neoplasm of pancreas, unspecified: Secondary | ICD-10-CM | POA: Diagnosis not present

## 2022-04-09 DIAGNOSIS — C250 Malignant neoplasm of head of pancreas: Secondary | ICD-10-CM | POA: Insufficient documentation

## 2022-04-09 DIAGNOSIS — K429 Umbilical hernia without obstruction or gangrene: Secondary | ICD-10-CM | POA: Diagnosis not present

## 2022-04-09 LAB — CBC WITH DIFFERENTIAL (CANCER CENTER ONLY)
Abs Immature Granulocytes: 0.02 10*3/uL (ref 0.00–0.07)
Basophils Absolute: 0 10*3/uL (ref 0.0–0.1)
Basophils Relative: 1 %
Eosinophils Absolute: 0.1 10*3/uL (ref 0.0–0.5)
Eosinophils Relative: 2 %
HCT: 31.5 % — ABNORMAL LOW (ref 39.0–52.0)
Hemoglobin: 10.5 g/dL — ABNORMAL LOW (ref 13.0–17.0)
Immature Granulocytes: 1 %
Lymphocytes Relative: 51 %
Lymphs Abs: 1.7 10*3/uL (ref 0.7–4.0)
MCH: 32.7 pg (ref 26.0–34.0)
MCHC: 33.3 g/dL (ref 30.0–36.0)
MCV: 98.1 fL (ref 80.0–100.0)
Monocytes Absolute: 0.3 10*3/uL (ref 0.1–1.0)
Monocytes Relative: 9 %
Neutro Abs: 1.2 10*3/uL — ABNORMAL LOW (ref 1.7–7.7)
Neutrophils Relative %: 36 %
Platelet Count: 211 10*3/uL (ref 150–400)
RBC: 3.21 MIL/uL — ABNORMAL LOW (ref 4.22–5.81)
RDW: 16.3 % — ABNORMAL HIGH (ref 11.5–15.5)
WBC Count: 3.3 10*3/uL — ABNORMAL LOW (ref 4.0–10.5)
nRBC: 0 % (ref 0.0–0.2)

## 2022-04-09 LAB — CMP (CANCER CENTER ONLY)
ALT: <5 U/L (ref 0–44)
AST: 26 U/L (ref 15–41)
Albumin: 4.4 g/dL (ref 3.5–5.0)
Alkaline Phosphatase: 53 U/L (ref 38–126)
Anion gap: 5 (ref 5–15)
BUN: 21 mg/dL (ref 8–23)
CO2: 29 mmol/L (ref 22–32)
Calcium: 9.5 mg/dL (ref 8.9–10.3)
Chloride: 106 mmol/L (ref 98–111)
Creatinine: 1.28 mg/dL — ABNORMAL HIGH (ref 0.61–1.24)
GFR, Estimated: 57 mL/min — ABNORMAL LOW (ref >60)
Glucose, Bld: 103 mg/dL — ABNORMAL HIGH (ref 70–99)
Potassium: 4.8 mmol/L (ref 3.5–5.1)
Sodium: 140 mmol/L (ref 135–145)
Total Bilirubin: 0.8 mg/dL (ref 0.3–1.2)
Total Protein: 7.2 g/dL (ref 6.5–8.1)

## 2022-04-09 MED ORDER — IOHEXOL 300 MG/ML  SOLN
100.0000 mL | Freq: Once | INTRAMUSCULAR | Status: AC | PRN
  Administered 2022-04-09: 100 mL via INTRAVENOUS

## 2022-04-10 ENCOUNTER — Inpatient Hospital Stay (HOSPITAL_BASED_OUTPATIENT_CLINIC_OR_DEPARTMENT_OTHER): Payer: Medicare Other | Admitting: Hematology

## 2022-04-10 ENCOUNTER — Encounter: Payer: Self-pay | Admitting: Hematology

## 2022-04-10 DIAGNOSIS — C250 Malignant neoplasm of head of pancreas: Secondary | ICD-10-CM | POA: Diagnosis not present

## 2022-04-10 NOTE — Progress Notes (Signed)
[name] [name]   [phone] [phone]   Clinic Follow up Note   Patient Care Team: [name], MD as PCP - General (Internal Medicine) Tat, Eustace Quail, DO as Consulting Physician (Neurology) Cammie Sickle, MD as Consulting Physician (Hematology and Oncology) Dwan Bolt, MD as Consulting Physician (General Surgery) Truitt Merle, MD as Consulting Physician (Hematology and Oncology)  Date of Service:  04/10/2022  I connected with [name] [name] on 04/10/2022 at  1:40 PM EDT by telephone visit and verified that I am speaking with the correct person using two identifiers.  I discussed the limitations, risks, security and privacy concerns of performing an evaluation and management service by telephone and the availability of in person appointments. I also discussed with the patient that there may be a patient responsible charge related to this service. The patient expressed understanding and agreed to proceed.   Other persons participating in the visit and their role in the encounter:  none  Patient's location:  lobby of PCP's office Provider's location:  my office  CHIEF COMPLAINT: f/u of pancreatic cancer  CURRENT THERAPY:  Pending radiation  ASSESSMENT & PLAN:  [name] [name] is a 81 y.o. male with   1. Pancreatic adenocarcinoma, stage IB cT2N0M0 -presented with abdominal discomfort. CT AP on 10/02/21, and MRI/MRCP on 10/28/21 showed 1.8 cm mass in pancreatic head and uncinate. 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 Staged at T2 N0. -baseline CA 19-9 from 01/02/22 was 730. -he met with Dr. Zenia Resides and is felt not to be a good surgical candidate given his multiple medical comorbidities. -staging chest CT showed no definite evidence of metastatic disease. -he received single agent gemcitabine on 01/13/22 - 04/01/22. He tolerated well overall   -post-treatment CT CAP on 04/09/22 showed stable pancreatic head mass, no evidence of metastatic disease. I reviewed the images myself and discussed the results with him today. -He is scheduled to meet Dr. [name] on 8/8, for EUS/fiducial placement on 04/16/22 and for CT simulation on 04/21/22.  -I do not plan to restart chemo after radiation, we plan to monitor his disease after radiation.  We will repeat a CT scan 3 months after RT   2. Metastatic prostate cancer  -initially diagnosed with Gleason 4+5 prostate cancer in 04/2019 for elevated PSA of 26.5. Staging CT AP on 05/16/19 showed two suspicious left pelvic lesions. He was started on Eligard by Dr. Diamantina Providence in urology; receiving q66month, next due ~7/26.  -Patient also receives Reclast once a year. -he did not tolerate Zytiga -Last PSA drawn on 10/13/21 showed good response at 0.06.  -due for Eligard, will receive per urologist -restaging CT CAP 04/09/22 showed stable osseous metastasis at left iliac and pubic bones.   3. Abdominal pain -Continue to monitor for now.  Symptoms currently controlled with Tylenol as needed.  4. Family history of cancer  -despite having no family history of malignancy, he does qualify for genetic testing due to hi pancreatic cancer diagnosis. -testing performed on 01/13/22; results were negative, with VUS in BLM and GALNT12.   5. 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 Sees Dr. TCarles Colletfrom neurology -Socially, [place]  He performs his ADLs.  He has a walker if he walks far distances.  The patient's  son lives 10 minutes away -The patient has a solitary right kidney.  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  Has some level of kidney disease.     Plan:  -consult with Dr. [name] 8/8 -EUS/fiducial marker placement  8/10 -CT sim 8/15 -lab and f/u in 6 weeks    No problem-specific Assessment & Plan notes found for this encounter.    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 Sclerotic 1.5 cm left ischial lesion/ Sclerotic medial left iliac bone 1.5 cm lesion (series 2/image 47), increased from 1.0 cm. Gleason score of 4+5= 9; with almost all cores involved greater than 80%.  9/16 Lupron 67-monthdepot on 9/16. [Urology; Dr.Siniski]  # MID OCT 2020- Zytiga 1000 mg+ prednisone; stopped December 2021 [poor tolerance if with RVR]; DISCONTINUED.   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       Prostate cancer metastatic to bone ([place]  05/24/2019 Initial Diagnosis   Prostate cancer metastatic to bone ([facility]   Pancreatic cancer ([facility]  10/02/2021 Imaging   CT ABDOMEN PELVIS W CONTRAST   IMPRESSION: 1. There is new pancreatic ductal dilation with an indeterminate 19 mm hypodense low-density mass area in the head/uncinate process of the pancreas. Recommend further evaluation with dedicated MRI/MRCP with contrast. 2. No evidence of bowel obstruction.  Moderate rectal stool ball. 3. Scattered peripherally located clustered pulmonary nodules in the RIGHT middle lobe. Findings are likely infectious or inflammatory in etiology. Given history of malignancy, recommend follow-up as per clinical protocol.   10/28/2021 Imaging   MR ABDOMEN MRCP W WO CONTAST   IMPRESSION: 1. Examination is significantly limited by breath motion artifact throughout. 2. The main pancreatic duct is diffusely dilated from the level of the superior pancreatic head, measuring up to 0.7 cm. 3. In the inferior pancreatic head and uncinate, there is  a multilobulated, fluid signal cystic lesion measuring 1.8 x 1.0 cm. Due to breath motion artifact it is difficult to determine whether this communicates to the adjacent duct. There are multiple additional subcentimeter cystic lesions scattered throughout the pancreas, several of which clearly communicate to the main pancreatic duct. 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 Recommend EUS/FNA for further diagnosis given the presence of pancreatic ductal dilatation. 4. Status post left nephrectomy. 5. No evidence of recurrent or metastatic disease in the abdomen.   12/25/2021 Procedure   UPPER ENDOSCOPIC ULTRASOUND-By Dr. MRush Landmark - A mass-like region was identified in the pancreatic head where the pancreatic duct dilates with multiple cystic regions noted throughout the pancreas as well. 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 However, the endosonographic appearance is suspicious for potential adenocarcinoma. This was staged T2 N0 Mx by endosonographic criteria. T - No malignant-appearing lymph nodes were visualized in the celiac region (level 20), peripancreatic region and porta hepatis region.   12/25/2021 Pathology Results   CYTOLOGY - NON PAP  CASE: [id number]   FINAL MICROSCOPIC DIAGNOSIS:  A. PANCREAS, HEAD, FINE NEEDLE ASPIRATION:  - Malignant cells consistent with adenocarcinoma     01/01/2022 Initial Diagnosis   Pancreatic cancer ([facility]   01/13/2022 -  Chemotherapy   Patient is on Treatment Plan : PANCREAS  Gemcitabine D1,15 q28d x 4 Cycles     01/26/2022 Cancer Staging   Staging form: Exocrine Pancreas, AJCC 8th Edition - Clinical: Stage IB (cT2, cN0, cM0) - Signed by Truitt Merle, MD on 01/26/2022 Total positive nodes: 0    Genetic Testing   [facility] CancerNext-Expanded Panel was Negative. Of note, a variant of uncertain significance was identified in the BLM gene (p.[id number]) and GALNT12 gene  ([id number]). Report date is 01/26/2022.  The CancerNext-Expanded gene panel offered by [facility] and includes sequencing, rearrangement, and RNA analysis for the following 77 genes: AIP, ALK, APC, ATM, AXIN2, BAP1, BARD1, BLM, BMPR1A, BRCA1, BRCA2, BRIP1, CDC73, CDH1, CDK4, CDKN1B, CDKN2A, CHEK2, CTNNA1, DICER1, FANCC, FH, FLCN, GALNT12, KIF1B, LZTR1, MAX, MEN1, MET, MLH1, MSH2, MSH3, MSH6, MUTYH, NBN, NF1, NF2, NTHL1, PALB2, PHOX2B, PMS2, POT1, PRKAR1A, PTCH1, PTEN, RAD51C, RAD51D, RB1, RECQL, RET, SDHA, SDHAF2, SDHB, SDHC, SDHD, SMAD4, SMARCA4, SMARCB1, SMARCE1, STK11, SUFU, TMEM127, TP53, TSC1, TSC2, VHL and XRCC2 (sequencing and deletion/duplication); EGFR, EGLN1, HOXB13, KIT, MITF, PDGFRA, POLD1, and POLE (sequencing only); EPCAM and GREM1 (deletion/duplication only).       INTERVAL HISTORY:  [name] [name] was contacted for a follow up of pancreatic cancer. He was last seen by me on 04/01/22.  He reports he did well with his last cycle of chemo, no concerns.   All other systems were reviewed with the patient and are negative.  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[place])    Prostate cancer metastatic to bone ([place])    REM sleep behavior disorder    Sleep apnea    BiPap    SURGICAL HISTORY: Past Surgical History:  Procedure Laterality Date   APPENDECTOMY  1963   BIOPSY  12/25/2021   Procedure: BIOPSY;  Surgeon: Irving Copas., MD;  Location: [place];  Service: Gastroenterology;;   COLONOSCOPY  2010, 2015   ESOPHAGOGASTRODUODENOSCOPY (EGD) WITH PROPOFOL N/A 12/25/2021   Procedure: ESOPHAGOGASTRODUODENOSCOPY (EGD) WITH PROPOFOL;  Surgeon: Irving Copas., MD;  Location: [place];  Service:  Gastroenterology;  Laterality: N/A;   EUS N/A 12/25/2021   Procedure: UPPER ENDOSCOPIC ULTRASOUND (EUS) RADIAL;  Surgeon: Irving Copas., MD;  Location: [place];  Service: Gastroenterology;  Laterality: N/A;   FINE NEEDLE ASPIRATION  12/25/2021   Procedure: FINE NEEDLE ASPIRATION (FNA) LINEAR;  Surgeon: Irving Copas., MD;  [facility];  Service: Gastroenterology;;   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   KYPHOPLASTY N/A 08/15/2020   Procedure: L2 compression fracture;  Surgeon: Hessie Knows, MD;  Location: [facility];  Service: Orthopedics;  Laterality: N/A;   KYPHOPLASTY N/A 08/29/2020   Procedure: T8 KYPHOPLASTY;  Surgeon: Hessie Knows, MD;  Location: [facility];  Service: Orthopedics;  Laterality: N/A;   KYPHOPLASTY N/A 11/12/2020   Procedure: L1 KYPHOPLASTY;  Surgeon: Hessie Knows, MD;  Location: [facility];  Service: Orthopedics;  Laterality: N/A;   PORTACATH PLACEMENT N/A 02/05/2022   Procedure: INSERTION PORT-A-CATH;  Surgeon: Dwan Bolt, MD;  Location: [facility];  Service: General;  Laterality: N/A;    I have reviewed the social history and family history with the patient and they are unchanged from previous note.  ALLERGIES:  is allergic to influenza vaccines.  MEDICATIONS:  Current Outpatient  Medications  Medication Sig Dispense Refill   acetaminophen (TYLENOL) 325 MG tablet Take 325 mg by mouth every 6 (six) hours as needed for moderate pain.     apixaban (ELIQUIS) 5 MG TABS tablet Take 1 tablet (5 mg total) by mouth 2 (two) times daily. 60 tablet 1   carbidopa-levodopa (SINEMET CR) 50-200 MG tablet TAKE 1 TABLET AT BEDTIME 90 tablet 1   carbidopa-levodopa (SINEMET IR) 25-100 MG tablet 3 tablets at 7 AM/2 at 11 AM/2 tablets at 3 PM/2 tablet at 7 PM 810 tablet 3   clonazePAM (KLONOPIN) 0.5 MG tablet Take 1 tablet by mouth at bedtime.     diltiazem (CARDIZEM) 30 MG tablet Take 1 tablet (30 mg total) by mouth 2 (two) times daily. 180 tablet 1    Glycerin-Hypromellose-PEG 400 (VISINE DRY EYE OP) Place 1 drop into both eyes 2 (two) times daily as needed (eye irritation).      ketoconazole (NIZORAL) 2 % shampoo Apply 1 application topically once a week.      leuprolide, 6 Month, (ELIGARD) 45 MG injection Inject 45 mg into the skin every 6 (six) months.     ondansetron (ZOFRAN) 8 MG tablet Take 1 tablet (8 mg total) by mouth 2 (two) times daily as needed (Nausea or vomiting). 30 tablet 1   prochlorperazine (COMPAZINE) 10 MG tablet Take 1 tablet (10 mg total) by mouth every 6 (six) hours as needed (Nausea or vomiting). 30 tablet 1   rosuvastatin (CRESTOR) 10 MG tablet Take 1 tablet (10 mg total) by mouth daily. 90 tablet 3   triamcinolone cream (KENALOG) 0.1 % Apply 1 application topically 2 (two) times daily. (Patient taking differently: Apply 1 application. topically 2 (two) times daily as needed (irritation).) 453.6 g 1   zoledronic acid (RECLAST) 5 MG/100ML SOLN injection Inject 5 mg into the vein once.     No current facility-administered medications for this visit.    PHYSICAL EXAMINATION: ECOG PERFORMANCE STATUS: 2 - Symptomatic, <50% confined to bed  There were no vitals filed for this visit. Wt Readings from Last 3 Encounters:  04/01/22 153 lb 14.4 oz (69.8 kg)  03/18/22 153 lb 8 oz (69.6 kg)  03/05/22 150 lb 14.4 oz (68.4 kg)     No vitals taken today, Exam not performed today  LABORATORY DATA:  I have reviewed the data as listed    Latest Ref Rng & Units 04/09/2022    1:55 PM 04/01/2022    9:31 AM 03/18/2022   11:21 AM  CBC  WBC 4.0 - 10.5 K/uL 3.3  4.0  4.5   Hemoglobin 13.0 - 17.0 g/dL 10.5  11.4  11.3   Hematocrit 39.0 - 52.0 % 31.5  34.4  33.6   Platelets 150 - 400 K/uL 211  128  154         Latest Ref Rng & Units 04/09/2022    1:55 PM 04/01/2022    9:31 AM 03/18/2022   11:21 AM  CMP  Glucose 70 - 99 mg/dL 103  84  100   BUN 8 - 23 mg/dL 21  17  17    Creatinine 0.61 - 1.24 mg/dL 1.28  1.21  1.15   Sodium 135  - 145 mmol/L 140  141  140   Potassium 3.5 - 5.1 mmol/L 4.8  4.0  4.1   Chloride 98 - 111 mmol/L 106  109  110   CO2 22 - 32 mmol/L 29  27  26    Calcium 8.9 -  10.3 mg/dL 9.5  9.6  9.6   Total Protein 6.5 - 8.1 g/dL 7.2  7.0  6.8   Total Bilirubin 0.3 - 1.2 mg/dL 0.8  0.9  1.0   Alkaline Phos 38 - 126 U/L 53  54  48   AST 15 - 41 U/L 26  26  26    ALT 0 - 44 U/L <5  <5  <5       RADIOGRAPHIC STUDIES: I have personally reviewed the radiological images as listed and agreed with the findings in the report. CT CHEST ABDOMEN PELVIS W CONTRAST  Result Date: 04/10/2022 CLINICAL DATA:  Restaging pancreas neoplasm EXAM: CT CHEST, ABDOMEN, AND PELVIS WITH CONTRAST TECHNIQUE: Multidetector CT imaging of the chest, abdomen and pelvis was performed following the standard protocol during bolus administration of intravenous contrast. RADIATION DOSE REDUCTION: This exam was performed according to the departmental dose-optimization program which includes automated exposure control, adjustment of the mA and/or kV according to patient size and/or use of iterative reconstruction technique. body onc CONTRAST:  188m OMNIPAQUE IOHEXOL 300 MG/ML  SOLN COMPARISON:  CT chest 01/12/2022 and MRI 10/28/2021 and CT AP from 10/02/2021 FINDINGS: CT CHEST FINDINGS Cardiovascular: Heart size is normal. No pericardial effusion. Aortic atherosclerosis. Coronary artery calcifications. No pericardial effusion. Mediastinum/Nodes: No enlarged mediastinal, hilar, or axillary lymph nodes. Thyroid gland, trachea, and esophagus demonstrate no significant findings. Lungs/Pleura: There is no pleural effusion, airspace consolidation, atelectasis or pneumothorax. Tiny nodule in the left lower lobe is unchanged measuring 3 mm, image 79/5. Similar appearance of peripheral tree-in-bud nodularity within the right middle lobe, image 98/5. Mild scarring noted within the right posterior lung base, unchanged. Musculoskeletal: Unchanged compression  deformities involving T7, T8, L1 and L2. Vertebral augmentation has been performed at T8, L1, and L2. No suspicious bone lesions. CT ABDOMEN PELVIS FINDINGS Hepatobiliary: Stable cyst within dome of liver. There is also a stable cyst within segment 3 measuring 5 mm. No suspicious liver lesions identified at this time. Normal appearance of the gallbladder. No significant bile duct dilatation. Pancreas: Main duct dilatation is again identified involving the neck, body and tail of pancreas with abrupt termination within the head of pancreas. Hypodense nodule within the head of pancreas is again noted. This measures approximately 2.1 by 1.8 by 1.4 cm, image 43/12 and image 37/8. On the previous MRI this measured approximately 2.1 by 1.8 by 1.7 cm. The small cystic lesion within the posterior head of pancreas is unchanged measuring 1.5 x 0.9 cm. Spleen: Normal in size without focal abnormality. Adrenals/Urinary Tract: Normal adrenal glands. Left nephrectomy. Right kidney appears normal. Urinary bladder unremarkable. Stomach/Bowel: Stomach appears normal. No bowel wall thickening, inflammation, or distension. Vascular/Lymphatic: The abdominal aorta is normal in caliber. Aortic atherosclerosis noted without aneurysm. Pancreatic head mass abuts the portal venous confluence, image 40/12. This appears similar when compared with the previous exam. No upper abdominal adenopathy. No signs of pelvic or inguinal adenopathy. Reproductive: Prostate is unremarkable. Other: There is no ascites or focal fluid collections. Small fat containing umbilical hernia. No evidence for peritoneal nodularity. Musculoskeletal: Unchanged sclerotic lesion in the left iliac bone is stable, image 73/8. There is a well-circumscribed sclerotic lesion within the left inferior pubic rami which is also unchanged in the interval. No new bone lesions identified.1. IMPRESSION: 1. Stable appearance of pancreatic head mass with associated main duct dilatation.  2. No evidence for metastatic disease. 3. Stable 3 mm nodule within the right lower lobe, nonspecific but favored to be inflammatory or  infectious in etiology. 4. Stable sclerotic lesions within the left iliac bone and left pubic bone which may reflect chronic metastasis from patient's prostate cancer. No new sites of bone metastases identified. 5. Aortic Atherosclerosis (ICD10-I70.0). Electronically Signed   By: Kerby Moors M.D.   On: 04/10/2022 08:43      No orders of the defined types were placed in this encounter.  All questions were answered. The patient knows to call the clinic with any problems, questions or concerns. No barriers to learning was detected. The total time spent in the appointment was 15 minutes.     Truitt Merle, MD 04/10/2022   I, Wilburn Mylar, am acting as scribe for Truitt Merle, MD.   I have reviewed the above documentation for accuracy and completeness, and I agree with the above.

## 2022-04-11 LAB — CANCER ANTIGEN 19-9: CA 19-9: 668 U/mL — ABNORMAL HIGH (ref 0–35)

## 2022-04-13 NOTE — Progress Notes (Signed)
Radiation Oncology         [phone] ________________________________  Name: [name] [name]        MRN: [id number]  Date of Service: 04/14/2022 DOB: 1941-06-20  [id number], [name] Spies, MD  Truitt Merle, MD     REFERRING PHYSICIAN: Truitt Merle, MD   DIAGNOSIS: The primary encounter diagnosis was Malignant neoplasm of head of pancreas ([place]). A diagnosis of Prostate cancer metastatic to bone [facility]) was also pertinent to this visit.   HISTORY OF PRESENT ILLNESS: [name] [name] is a 81 y.o. male seen at the request of Dr. Burr Medico for diagnosis of pancreas cancer.  He presented with abdominal pain in January 2023 and a CT of the abdomen and pelvis on 10/02/2021 showed a mass in the head of the pancreas/uncinate.  By MRCP on 10/28/2021 this measured 1.8 cm, he underwent endoscopic ultrasound on 12/25/2021 showing a 2.8 cm mass in the head of the pancreas with evidence of abutment of the portal vein but no direct invasion.  His staging at that time was T2N0.  CA 19-9 was 730, and fine-needle aspirate from his endoscopic ultrasound was consistent with adenocarcinoma.  He was not felt to be a good surgical candidate after meeting with Dr. Zenia Resides and hepatobiliary surgery.  Additional staging confirmed that this was stage I with no evidence of metastatic disease.  He received single agent Gemzar beginning 01/13/2022 and posttreatment imaging after completing his last cycle on 04/01/2022 showed no progression and stability.  This was performed on 04/09/2022.  He is seen to discuss stereotactic body radiotherapy and is scheduled to undergo endoscopic ultrasound with fiducial marker placement on 04/16/2022.  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  He has pelvic bony disease and has remained on Eligard with Dr. Diamantina Providence in urology.  He has had response to treatment and apparently his last PSA in February 2023 was 0.06.       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 ([place])    Prostate cancer metastatic to bone ([place])    REM sleep behavior disorder    Sleep apnea    BiPap       PAST SURGICAL HISTORY: Past Surgical History:  Procedure Laterality Date   APPENDECTOMY  1963   BIOPSY  12/25/2021   Procedure: BIOPSY;  Surgeon: Irving Copas., MD;  Location: [facility] ENDOSCOPY;  Service: Gastroenterology;;   COLONOSCOPY  2010, 2015   ESOPHAGOGASTRODUODENOSCOPY (EGD) WITH PROPOFOL N/A 12/25/2021   Procedure: ESOPHAGOGASTRODUODENOSCOPY (EGD) WITH PROPOFOL;  Surgeon: Irving Copas., MD;  Location: [place];  Service: Gastroenterology;  Laterality: N/A;   EUS N/A 12/25/2021   Procedure: UPPER ENDOSCOPIC ULTRASOUND (EUS) RADIAL;  Surgeon: Irving Copas., MD;  Location: [place];  Service: Gastroenterology;  Laterality: N/A;   FINE NEEDLE ASPIRATION  12/25/2021   Procedure: FINE NEEDLE ASPIRATION (FNA) LINEAR;  Surgeon: Irving Copas., MD;  Location[facility] ENDOSCOPY;  Service: Gastroenterology;;   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   KYPHOPLASTY N/A 08/15/2020   Procedure: L2 compression fracture;  Surgeon: Hessie Knows, MD;  Location: [facility] ORS;  Service: Orthopedics;  Laterality: N/A;   KYPHOPLASTY N/A 08/29/2020   Procedure: T8 KYPHOPLASTY;  Surgeon: Hessie Knows, MD;  Location: [facility] ORS;  Service: Orthopedics;  Laterality: N/A;   KYPHOPLASTY N/A 11/12/2020   Procedure: L1 KYPHOPLASTY;  Surgeon: Hessie Knows, MD;  Location: [facility] ORS;  Service: Orthopedics;  Laterality: N/A;   PORTACATH PLACEMENT N/A 02/05/2022   Procedure: INSERTION PORT-A-CATH;  Surgeon: Dwan Bolt, MD;   Location: [facility] ORS;  Service: General;  Laterality: N/A;     FAMILY HISTORY:  Family History  Problem Relation Age of Onset   Heart disease Maternal Grandfather    Diabetes Paternal Grandfather    Breast cancer Daughter      SOCIAL HISTORY:  reports that he quit smoking about 11 years ago. His smoking use included cigars. He has never used smokeless tobacco. He reports current alcohol use. He reports that he does not use drugs.   ALLERGIES: Influenza vaccines   MEDICATIONS:  Current Outpatient Medications  Medication Sig Dispense Refill   acetaminophen (TYLENOL) 325 MG tablet Take 325 mg by mouth every 6 (six) hours as needed for moderate pain.     apixaban (ELIQUIS) 5 MG TABS tablet Take 1 tablet (5 mg total) by mouth 2 (two) times daily. 60 tablet 1   carbidopa-levodopa (SINEMET CR) 50-200 MG tablet TAKE 1 TABLET AT BEDTIME 90 tablet 1   carbidopa-levodopa (SINEMET IR) 25-100 MG tablet 3 tablets at 7 AM/2 at 11 AM/2 tablets at 3 PM/2 tablet at 7 PM 810 tablet 3   clonazePAM (KLONOPIN) 0.5 MG tablet Take 1 tablet by mouth at bedtime.     diltiazem (CARDIZEM) 30 MG tablet Take 1 tablet (30 mg total) by mouth 2 (two) times daily. 180 tablet 1   Glycerin-Hypromellose-PEG 400 (VISINE DRY EYE OP) Place 1 drop into both eyes 2 (two) times daily as needed (eye irritation).      ketoconazole (NIZORAL) 2 % shampoo Apply 1 application topically once a week.      leuprolide, 6 Month, (ELIGARD) 45 MG injection Inject 45 mg into the skin every 6 (six) months.     ondansetron (ZOFRAN) 8 MG tablet Take 1 tablet (8 mg total) by mouth 2 (two) times daily as needed (Nausea or vomiting). 30 tablet 1   prochlorperazine (COMPAZINE) 10 MG tablet Take 1 tablet (10 mg total) by mouth every 6 (six) hours as needed (Nausea or vomiting). 30 tablet 1   rosuvastatin (CRESTOR) 10 MG tablet Take 1 tablet (10 mg total) by mouth daily. 90 tablet 3   triamcinolone cream (KENALOG) 0.1 % Apply 1 application topically 2  (two) times daily. (Patient taking differently: Apply 1 application. topically 2 (two) times daily as needed (irritation).) 453.6 g 1   zoledronic acid (RECLAST) 5 MG/100ML SOLN injection Inject 5 mg into the vein once.     No current facility-administered medications for this visit.     REVIEW OF SYSTEMS: On review of systems, the patient reports that *** is doing well overall. *** denies any chest pain, shortness of breath, cough, fevers, chills, night sweats, unintended weight changes. *** denies any bowel or bladder disturbances, and denies abdominal pain, nausea or vomiting. *** denies any new musculoskeletal or joint aches or pains. A complete review of systems is obtained and is otherwise negative.     PHYSICAL EXAM:  Wt Readings from Last 3 Encounters:  04/01/22 153 lb 14.4 oz (69.8 kg)  03/18/22 153 lb 8 oz (69.6 kg)  03/05/22 150  lb 14.4 oz (68.4 kg)   Temp Readings from Last 3 Encounters:  04/01/22 97.8 F (36.6 C) (Oral)  04/01/22 (!) 97.5 F (36.4 C) (Oral)  03/18/22 97.7 F (36.5 C) (Oral)   BP Readings from Last 3 Encounters:  04/01/22 (!) 91/56  04/01/22 103/65  03/18/22 105/64   Pulse Readings from Last 3 Encounters:  04/01/22 (!) 56  04/01/22 (!) 57  03/18/22 (!) 58    /10  In general this is a well appearing *** in no acute distress. ***'s alert and oriented x4 and appropriate throughout the examination. Cardiopulmonary assessment is negative for acute distress and *** exhibits normal effort.     ECOG = ***  0 - Asymptomatic (Fully active, able to carry on all predisease activities without restriction)  1 - Symptomatic but completely ambulatory (Restricted in physically strenuous activity but ambulatory and able to carry out work of a light or sedentary nature. For example, light housework, office work)  2 - Symptomatic, <50% in bed during the day (Ambulatory and capable of all self care but unable to carry out any work activities. Up and about more  than 50% of waking hours)  3 - Symptomatic, >50% in bed, but not bedbound (Capable of only limited self-care, confined to bed or chair 50% or more of waking hours)  4 - Bedbound (Completely disabled. Cannot carry on any self-care. Totally confined to bed or chair)  5 - Death   Eustace Pen MM, Creech RH, Tormey DC, et al. [phone]). "Toxicity and response criteria of the [facility]". [place] Oncol. 5 (6): 649-55    LABORATORY DATA:  Lab Results  Component Value Date   WBC 3.3 (L) 04/09/2022   HGB 10.5 (L) 04/09/2022   HCT 31.5 (L) 04/09/2022   MCV 98.1 04/09/2022   PLT 211 04/09/2022   Lab Results  Component Value Date   NA 140 04/09/2022   K 4.8 04/09/2022   CL 106 04/09/2022   CO2 29 04/09/2022   Lab Results  Component Value Date   ALT <5 04/09/2022   AST 26 04/09/2022   ALKPHOS 53 04/09/2022   BILITOT 0.8 04/09/2022      RADIOGRAPHY: CT CHEST ABDOMEN PELVIS W CONTRAST  Result Date: 04/10/2022 CLINICAL DATA:  Restaging pancreas neoplasm EXAM: CT CHEST, ABDOMEN, AND PELVIS WITH CONTRAST TECHNIQUE: Multidetector CT imaging of the chest, abdomen and pelvis was performed following the standard protocol during bolus administration of intravenous contrast. RADIATION DOSE REDUCTION: This exam was performed according to the departmental dose-optimization program which includes automated exposure control, adjustment of the mA and/or kV according to patient size and/or use of iterative reconstruction technique. ***body onc*** CONTRAST:  176m OMNIPAQUE IOHEXOL 300 MG/ML  SOLN COMPARISON:  CT chest 01/12/2022 and MRI 10/28/2021 and CT AP from 10/02/2021 FINDINGS: CT CHEST FINDINGS Cardiovascular: Heart size is normal. No pericardial effusion. Aortic atherosclerosis. Coronary artery calcifications. No pericardial effusion. Mediastinum/Nodes: No enlarged mediastinal, hilar, or axillary lymph nodes. Thyroid gland, trachea, and esophagus demonstrate no significant findings.  Lungs/Pleura: There is no pleural effusion, airspace consolidation, atelectasis or pneumothorax. Tiny nodule in the left lower lobe is unchanged measuring 3 mm, image 79/5. Similar appearance of peripheral tree-in-bud nodularity within the right middle lobe, image 98/5. Mild scarring noted within the right posterior lung base, unchanged. Musculoskeletal: Unchanged compression deformities involving T7, T8, L1 and L2. Vertebral augmentation has been performed at T8, L1, and L2. No suspicious bone lesions. CT ABDOMEN PELVIS FINDINGS Hepatobiliary: Stable cyst  within dome of liver. There is also a stable cyst within segment 3 measuring 5 mm. No suspicious liver lesions identified at this time. Normal appearance of the gallbladder. No significant bile duct dilatation. Pancreas: Main duct dilatation is again identified involving the neck, body and tail of pancreas with abrupt termination within the head of pancreas. Hypodense nodule within the head of pancreas is again noted. This measures approximately 2.1 by 1.8 by 1.4 cm, image 43/12 and image 37/8. On the previous MRI this measured approximately 2.1 by 1.8 by 1.7 cm. The small cystic lesion within the posterior head of pancreas is unchanged measuring 1.5 x 0.9 cm. Spleen: Normal in size without focal abnormality. Adrenals/Urinary Tract: Normal adrenal glands. Left nephrectomy. Right kidney appears normal. Urinary bladder unremarkable. Stomach/Bowel: Stomach appears normal. No bowel wall thickening, inflammation, or distension. Vascular/Lymphatic: The abdominal aorta is normal in caliber. Aortic atherosclerosis noted without aneurysm. Pancreatic head mass abuts the portal venous confluence, image 40/12. This appears similar when compared with the previous exam. No upper abdominal adenopathy. No signs of pelvic or inguinal adenopathy. Reproductive: Prostate is unremarkable. Other: There is no ascites or focal fluid collections. Small fat containing umbilical hernia. No  evidence for peritoneal nodularity. Musculoskeletal: Unchanged sclerotic lesion in the left iliac bone is stable, image 73/8. There is a well-circumscribed sclerotic lesion within the left inferior pubic rami which is also unchanged in the interval. No new bone lesions identified.1. IMPRESSION: 1. Stable appearance of pancreatic head mass with associated main duct dilatation. 2. No evidence for metastatic disease. 3. Stable 3 mm nodule within the right lower lobe, nonspecific but favored to be inflammatory or infectious in etiology. 4. Stable sclerotic lesions within the left iliac bone and left pubic bone which may reflect chronic metastasis from patient's prostate cancer. No new sites of bone metastases identified. 5. Aortic Atherosclerosis (ICD10-I70.0). Electronically Signed   By: Kerby Moors M.D.   On: 04/10/2022 08:43       IMPRESSION/PLAN: 1. 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  The patient is felt to be unresectable due to his medical comorbidities rather than his tumor however he has completed single agent Gemzar.  Dr. [name] discusses the rationale for consideration of stereotactic body radiotherapy to delay progression and halt tumor growth.  He is already scheduled to undergo fiducial marker placement later this week on Thursday.  We discussed the importance of this for treatment planning purposes.  We discussed the risks, benefits, short, and long term effects of radiotherapy, as well as the definitive dosing intent, and the patient is interested in proceeding. Dr. [name] discusses the delivery and logistics of radiotherapy and anticipates a course of 5 fractions of stereotactic body radiotherapy.  He will have IV contrast at the time of simulation on 04/21/2022. Written consent is obtained and placed in the chart, a copy was provided to the patient.  2. Metastatic prostate cancer.  The patient continues to follow with his urologist and appears to remain in remission  based on his PSA scores earlier this year with Dr. Diamantina Providence.  This will be followed expectantly.  In a visit lasting *** minutes, greater than 50% of the time was spent face to face discussing the patient's condition, in preparation for the discussion, and coordinating the patient's care.   The above documentation reflects my direct findings during this shared patient visit. Please see the separate note by Dr. [name] on this date for the remainder of the patient's plan of care.  Carola Rhine, [facility]   **Disclaimer: This note was dictated with voice recognition software. Similar sounding words can inadvertently be transcribed and this note may contain transcription errors which may not have been corrected upon publication of note.**

## 2022-04-13 NOTE — Progress Notes (Signed)
GI Location of Tumor / Histology: Pancreas- Head  [name] [name] presented with abdominal pain in January 2023 and a CT of the abdomen and pelvis on 10/02/2021 showed a mass in the head of the pancreas/uncinate.    EUS with fiducial marker placement 04/16/2022.  CT CAP 04/09/2022: stable pancreatic head mass, no evidence of metastatic disease.  EUS 12/25/2021: 2.8 cm mass in the head of the pancreas with evidence of abutment of the portal vein but no direct invasion.   MRCP 10/28/2021: 1.8 cm mass in the pancreatic head and uncinate.   Biopsies of Pancreas Head Mass 12/25/2021    Past/Anticipated interventions by surgeon, if any:  Dr. Zenia Resides -Not a surgical candidate given his multiple co-morbidities.   Past/Anticipated interventions by medical oncology, if any:  Dr. Burr Medico 04/10/2022 -Single agent Gemcitabine 01/13/2022-04/01/2022 -post-treatment CT CAP on 04/09/22 showed stable pancreatic head mass, no evidence of metastatic disease.  -I do not plan to restart chemo after radiation, we plan to monitor his disease after radiation.  We will repeat a CT scan 3 months after RT   Weight changes, if any: No  Bowel/Bladder complaints, if any: No  Nausea / Vomiting, if any: No  Pain issues, if any: Notes his stomach feels like it has a bowling ball in it.  Appetite: Eating and drinking well.      SAFETY ISSUES: Prior radiation? No Pacemaker/ICD? No Possible current pregnancy? N/a Is the patient on methotrexate? No  Current Complaints/Details: Parkinson's Disease

## 2022-04-14 ENCOUNTER — Ambulatory Visit
Admission: RE | Admit: 2022-04-14 | Discharge: 2022-04-14 | Disposition: A | Discharge status: Home / Self Care | Payer: Medicare Other | Source: Ambulatory Visit | Attending: Radiation Oncology | Admitting: Radiation Oncology

## 2022-04-14 ENCOUNTER — Ambulatory Visit
Admission: RE | Admit: 2022-04-14 | Discharge: 2022-04-14 | Disposition: A | Discharge status: Home / Self Care | Payer: Medicare Other | Source: Ambulatory Visit | Attending: Hematology | Admitting: Hematology

## 2022-04-14 ENCOUNTER — Other Ambulatory Visit: Payer: Self-pay

## 2022-04-14 ENCOUNTER — Other Ambulatory Visit: Payer: Self-pay | Admitting: Radiation Oncology

## 2022-04-14 VITALS — BP 97/66 | HR 54 | Temp 97.8°F | Resp 20 | Ht 65.0 in | Wt 155.0 lb

## 2022-04-14 DIAGNOSIS — C250 Malignant neoplasm of head of pancreas: Secondary | ICD-10-CM

## 2022-04-14 DIAGNOSIS — Z87891 Personal history of nicotine dependence: Secondary | ICD-10-CM | POA: Diagnosis not present

## 2022-04-14 DIAGNOSIS — G20 Parkinson's disease: Secondary | ICD-10-CM | POA: Insufficient documentation

## 2022-04-14 DIAGNOSIS — Z510 Encounter for antineoplastic radiation therapy: Secondary | ICD-10-CM | POA: Insufficient documentation

## 2022-04-14 DIAGNOSIS — C7951 Secondary malignant neoplasm of bone: Secondary | ICD-10-CM | POA: Diagnosis not present

## 2022-04-14 DIAGNOSIS — G4730 Sleep apnea, unspecified: Secondary | ICD-10-CM | POA: Diagnosis not present

## 2022-04-14 DIAGNOSIS — Z8546 Personal history of malignant neoplasm of prostate: Secondary | ICD-10-CM | POA: Insufficient documentation

## 2022-04-14 DIAGNOSIS — C61 Malignant neoplasm of prostate: Secondary | ICD-10-CM | POA: Diagnosis not present

## 2022-04-14 DIAGNOSIS — Z79899 Other long term (current) drug therapy: Secondary | ICD-10-CM | POA: Insufficient documentation

## 2022-04-14 LAB — CBC WITH DIFFERENTIAL/PLATELET
Abs Immature Granulocytes: 0.03 10*3/uL (ref 0.00–0.07)
Basophils Absolute: 0 10*3/uL (ref 0.0–0.1)
Basophils Relative: 1 %
Eosinophils Absolute: 0.1 10*3/uL (ref 0.0–0.5)
Eosinophils Relative: 2 %
HCT: 32.8 % — ABNORMAL LOW (ref 39.0–52.0)
Hemoglobin: 10.7 g/dL — ABNORMAL LOW (ref 13.0–17.0)
Immature Granulocytes: 1 %
Lymphocytes Relative: 26 %
Lymphs Abs: 1.3 10*3/uL (ref 0.7–4.0)
MCH: 32.5 pg (ref 26.0–34.0)
MCHC: 32.6 g/dL (ref 30.0–36.0)
MCV: 99.7 fL (ref 80.0–100.0)
Monocytes Absolute: 0.5 10*3/uL (ref 0.1–1.0)
Monocytes Relative: 10 %
Neutro Abs: 3.1 10*3/uL (ref 1.7–7.7)
Neutrophils Relative %: 60 %
Platelets: 136 10*3/uL — ABNORMAL LOW (ref 150–400)
RBC: 3.29 MIL/uL — ABNORMAL LOW (ref 4.22–5.81)
RDW: 17.4 % — ABNORMAL HIGH (ref 11.5–15.5)
WBC: 5.1 10*3/uL (ref 4.0–10.5)
nRBC: 0 % (ref 0.0–0.2)

## 2022-04-14 LAB — COMPREHENSIVE METABOLIC PANEL
ALT: <5 U/L (ref 0–44)
AST: 24 U/L (ref 15–41)
Albumin: 4.2 g/dL (ref 3.5–5.0)
Alkaline Phosphatase: 48 U/L (ref 38–126)
Anion gap: 3 — ABNORMAL LOW (ref 5–15)
BUN: 16 mg/dL (ref 8–23)
CO2: 29 mmol/L (ref 22–32)
Calcium: 9.4 mg/dL (ref 8.9–10.3)
Chloride: 109 mmol/L (ref 98–111)
Creatinine, Ser: 1.16 mg/dL (ref 0.61–1.24)
GFR, Estimated: >60 mL/min (ref >60)
Glucose, Bld: 111 mg/dL — ABNORMAL HIGH (ref 70–99)
Potassium: 4.7 mmol/L (ref 3.5–5.1)
Sodium: 141 mmol/L (ref 135–145)
Total Bilirubin: 0.8 mg/dL (ref 0.3–1.2)
Total Protein: 7 g/dL (ref 6.5–8.1)

## 2022-04-15 ENCOUNTER — Other Ambulatory Visit: Payer: Medicare Other

## 2022-04-15 DIAGNOSIS — C250 Malignant neoplasm of head of pancreas: Secondary | ICD-10-CM | POA: Diagnosis not present

## 2022-04-15 LAB — PROSTATE-SPECIFIC AG, SERUM (LABCORP): Prostate Specific Ag, Serum: <0.1 ng/mL (ref 0.0–4.0)

## 2022-04-15 NOTE — Anesthesia Preprocedure Evaluation (Addendum)
Anesthesia Evaluation  Patient identified by MRN, date of birth, ID band Patient awake    Reviewed: Allergy & Precautions, NPO status , Patient's Chart, lab work & pertinent test results  Airway Mallampati: II  TM Distance: >3 FB Neck ROM: Full    Dental no notable dental hx. (+) Dental Advisory Given, Teeth Intact   Pulmonary sleep apnea and Continuous Positive Airway Pressure Ventilation , former smoker,    Pulmonary exam normal breath sounds clear to auscultation       Cardiovascular Normal cardiovascular exam+ dysrhythmias Atrial Fibrillation  Rhythm:Regular Rate:Normal     Neuro/Psych    GI/Hepatic GERD  ,  Endo/Other    Renal/GU      Musculoskeletal   Abdominal   Peds  Hematology   Anesthesia Other Findings All: infuenza vaccine  Pancreatic CA  Reproductive/Obstetrics                           Anesthesia Physical Anesthesia Plan  ASA: 3  Anesthesia Plan: MAC   Post-op Pain Management: Minimal or no pain anticipated   Induction: Intravenous  PONV Risk Score and Plan: 2 and 1 and Treatment may vary due to age or medical condition, TIVA and Midazolam  Airway Management Planned: Natural Airway, Simple Face Mask and Nasal Cannula  Additional Equipment: None  Intra-op Plan:   Post-operative Plan:   Informed Consent: I have reviewed the patients History and Physical, chart, labs and discussed the procedure including the risks, benefits and alternatives for the proposed anesthesia with the patient or authorized representative who has indicated his/her understanding and acceptance.     Dental advisory given  Plan Discussed with:   Anesthesia Plan Comments: (EUS for adenocarcinoma)       Anesthesia Quick Evaluation                                  Anesthesia Evaluation  Patient identified by MRN, date of birth, ID band Patient awake    Reviewed: Allergy &  Precautions, NPO status , Patient's Chart, lab work & pertinent test results  Airway Mallampati: II  TM Distance: >3 FB Neck ROM: Full    Dental no notable dental hx. (+) Dental Advisory Given, Teeth Intact   Pulmonary sleep apnea and Continuous Positive Airway Pressure Ventilation , former smoker,    Pulmonary exam normal breath sounds clear to auscultation       Cardiovascular Exercise Tolerance: Good Normal cardiovascular exam+ dysrhythmias Atrial Fibrillation  Rhythm:Regular Rate:Normal     Neuro/Psych negative neurological ROS     GI/Hepatic Neg liver ROS, GERD  Medicated and Controlled,  Endo/Other  negative endocrine ROS  Renal/GU Lab Results      Component                Value               Date                      CREATININE               1.18                01/27/2022                K  4.3                 01/27/2022                    Musculoskeletal negative musculoskeletal ROS (+)   Abdominal   Peds  Hematology Lab Results      Component                Value               Date                      WBC                      4.8                 01/27/2022                HGB                      12.4 (L)            01/27/2022                HCT                      37.8 (L)            01/27/2022                MCV                      93.3                01/27/2022                PLT                      167                 01/27/2022              Anesthesia Other Findings   Reproductive/Obstetrics                            Anesthesia Physical Anesthesia Plan  ASA: 3  Anesthesia Plan: General   Post-op Pain Management:    Induction: Intravenous  PONV Risk Score and Plan: Treatment may vary due to age or medical condition  Airway Management Planned: LMA  Additional Equipment:   Intra-op Plan:   Post-operative Plan:   Informed Consent: I have reviewed the patients History and  Physical, chart, labs and discussed the procedure including the risks, benefits and alternatives for the proposed anesthesia with the patient or authorized representative who has indicated his/her understanding and acceptance.     Dental advisory given  Plan Discussed with: CRNA and Anesthesiologist  Anesthesia Plan Comments:        Anesthesia Quick Evaluation                                   Anesthesia Evaluation  Patient identified by MRN, date of birth, ID band Patient awake    Reviewed: Allergy & Precautions, NPO status , Patient's Chart, lab work & pertinent test results  Airway Mallampati: II  TM Distance: >3 FB Neck ROM: Full    Dental no notable dental hx. (+) Dental Advisory Given, Teeth Intact   Pulmonary sleep apnea and Continuous Positive Airway Pressure Ventilation , former smoker,    Pulmonary exam normal breath sounds clear to auscultation       Cardiovascular Exercise Tolerance: Good Normal cardiovascular exam+ dysrhythmias Atrial Fibrillation  Rhythm:Regular Rate:Normal     Neuro/Psych negative neurological ROS     GI/Hepatic Neg liver ROS, GERD  Medicated and Controlled,  Endo/Other  negative endocrine ROS  Renal/GU Lab Results      Component                Value               Date                      CREATININE               1.18                01/27/2022                K                        4.3                 01/27/2022                    Musculoskeletal negative musculoskeletal ROS (+)   Abdominal   Peds  Hematology Lab Results      Component                Value               Date                      WBC                      4.8                 01/27/2022                HGB                      12.4 (L)            01/27/2022                HCT                      37.8 (L)            01/27/2022                MCV                      93.3                01/27/2022                PLT                      167                  01/27/2022  Anesthesia Other Findings   Reproductive/Obstetrics                            Anesthesia Physical Anesthesia Plan  ASA: 3  Anesthesia Plan: General   Post-op Pain Management:    Induction: Intravenous  PONV Risk Score and Plan: Treatment may vary due to age or medical condition  Airway Management Planned: LMA  Additional Equipment:   Intra-op Plan:   Post-operative Plan:   Informed Consent: I have reviewed the patients History and Physical, chart, labs and discussed the procedure including the risks, benefits and alternatives for the proposed anesthesia with the patient or authorized representative who has indicated his/her understanding and acceptance.     Dental advisory given  Plan Discussed with: CRNA and Anesthesiologist  Anesthesia Plan Comments:        Anesthesia Quick Evaluation

## 2022-04-16 ENCOUNTER — Ambulatory Visit: Payer: Medicare Other | Admitting: Hematology

## 2022-04-16 ENCOUNTER — Ambulatory Visit (HOSPITAL_BASED_OUTPATIENT_CLINIC_OR_DEPARTMENT_OTHER): Payer: Medicare Other | Admitting: Anesthesiology

## 2022-04-16 ENCOUNTER — Ambulatory Visit: Payer: Medicare Other

## 2022-04-16 ENCOUNTER — Encounter (HOSPITAL_COMMUNITY)
Admission: RE | Disposition: A | Discharge status: Home / Self Care | Payer: Self-pay | Source: Home / Self Care | Attending: Gastroenterology

## 2022-04-16 ENCOUNTER — Ambulatory Visit (HOSPITAL_COMMUNITY)
Admission: RE | Admit: 2022-04-16 | Discharge: 2022-04-16 | Disposition: A | Discharge status: Home / Self Care | Payer: Medicare Other | Attending: Gastroenterology | Admitting: Gastroenterology

## 2022-04-16 ENCOUNTER — Other Ambulatory Visit: Payer: Medicare Other

## 2022-04-16 ENCOUNTER — Encounter (HOSPITAL_COMMUNITY): Payer: Self-pay | Admitting: Gastroenterology

## 2022-04-16 ENCOUNTER — Ambulatory Visit (HOSPITAL_COMMUNITY): Payer: Medicare Other | Admitting: Anesthesiology

## 2022-04-16 DIAGNOSIS — Z08 Encounter for follow-up examination after completed treatment for malignant neoplasm: Secondary | ICD-10-CM | POA: Diagnosis not present

## 2022-04-16 DIAGNOSIS — K2289 Other specified disease of esophagus: Secondary | ICD-10-CM

## 2022-04-16 DIAGNOSIS — C801 Malignant (primary) neoplasm, unspecified: Secondary | ICD-10-CM

## 2022-04-16 DIAGNOSIS — I4891 Unspecified atrial fibrillation: Secondary | ICD-10-CM | POA: Insufficient documentation

## 2022-04-16 DIAGNOSIS — K449 Diaphragmatic hernia without obstruction or gangrene: Secondary | ICD-10-CM

## 2022-04-16 DIAGNOSIS — C250 Malignant neoplasm of head of pancreas: Secondary | ICD-10-CM | POA: Diagnosis not present

## 2022-04-16 DIAGNOSIS — K862 Cyst of pancreas: Secondary | ICD-10-CM

## 2022-04-16 DIAGNOSIS — K219 Gastro-esophageal reflux disease without esophagitis: Secondary | ICD-10-CM | POA: Diagnosis not present

## 2022-04-16 DIAGNOSIS — G4730 Sleep apnea, unspecified: Secondary | ICD-10-CM | POA: Insufficient documentation

## 2022-04-16 DIAGNOSIS — Z87891 Personal history of nicotine dependence: Secondary | ICD-10-CM | POA: Diagnosis not present

## 2022-04-16 HISTORY — PX: FIDUCIAL MARKER PLACEMENT: SHX6858

## 2022-04-16 HISTORY — PX: ESOPHAGOGASTRODUODENOSCOPY: SHX5428

## 2022-04-16 HISTORY — PX: EUS: SHX5427

## 2022-04-16 SURGERY — EGD (ESOPHAGOGASTRODUODENOSCOPY)
Anesthesia: Monitor Anesthesia Care

## 2022-04-16 MED ORDER — PROPOFOL 10 MG/ML IV BOLUS: INTRAVENOUS | Status: DC | PRN

## 2022-04-16 MED ORDER — SODIUM CHLORIDE 0.9 % IV SOLN: INTRAVENOUS | Status: DC

## 2022-04-16 MED ORDER — LIDOCAINE 2% (20 MG/ML) 5 ML SYRINGE
INTRAMUSCULAR | Status: DC | PRN
  Administered 2022-04-16: 100 mg via INTRAVENOUS

## 2022-04-16 MED ORDER — PROPOFOL 500 MG/50ML IV EMUL: INTRAVENOUS | Status: DC | PRN

## 2022-04-16 MED ORDER — ACETAMINOPHEN 500 MG PO TABS
500.0000 mg | ORAL_TABLET | Freq: Four times a day (QID) | ORAL | 0 refills | Status: DC | PRN
Start: 2022-04-18 — End: 2024-03-01

## 2022-04-16 MED ORDER — LIDOCAINE 2% (20 MG/ML) 5 ML SYRINGE: INTRAMUSCULAR | Status: DC | PRN

## 2022-04-16 MED ORDER — PROPOFOL 500 MG/50ML IV EMUL
INTRAVENOUS | Status: DC | PRN
  Administered 2022-04-16: 150 ug/kg/min via INTRAVENOUS

## 2022-04-16 MED ORDER — PROPOFOL 10 MG/ML IV BOLUS
INTRAVENOUS | Status: DC | PRN
  Administered 2022-04-16: 30 mg via INTRAVENOUS

## 2022-04-16 MED ORDER — LACTATED RINGERS IV SOLN: INTRAVENOUS | Status: DC

## 2022-04-16 SURGICAL SUPPLY — 1 items: fiducial system (Implant Marker) ×4 IMPLANT

## 2022-04-16 NOTE — Transfer of Care (Signed)
Immediate Anesthesia Transfer of Care Note  Patient: [name] [name]  Procedure(s) Performed: UPPER ENDOSCOPIC ULTRASOUND (EUS) RADIAL FIDUCIAL MARKER PLACEMENT ESOPHAGOGASTRODUODENOSCOPY (EGD)  Patient Location: PACU  Anesthesia Type:MAC  Level of Consciousness: drowsy and patient cooperative  Airway & Oxygen Therapy: Patient Spontanous Breathing and Patient connected to nasal cannula oxygen  Post-op Assessment: Report given to RN and Post -op Vital signs reviewed and stable  Post vital signs: Reviewed and stable  Last Vitals:  Vitals Value Taken Time  BP 125/79 04/16/22 1109  Temp    Pulse 50 04/16/22 1111  Resp 14 04/16/22 1111  SpO2 100 % 04/16/22 1111  Vitals shown include unvalidated device data.  Last Pain:  Vitals:   04/16/22 0813  TempSrc: Tympanic  PainSc: 0-No pain         Complications: No notable events documented.

## 2022-04-16 NOTE — Op Note (Addendum)
[facility] Patient Name: [name] [name] Procedure Date : 04/16/2022 MRN: [id number] Attending MD: Justice Britain , MD Date of Birth: [birth year] CSN: [id number] Age: 81 Admit Type: Outpatient Procedure:                Upper EUS Indications:              Pancreatic adenocarcinoma/Post-chemotherapy,                            Fiducial Providers:                Justice Britain, MD, Kary Kos RN, RN, Frazier Richards, Technician Referring MD:             Truitt Merle, Jodelle Gross Medicines:                Monitored Anesthesia Care Complications:            No immediate complications. Estimated Blood Loss:     Estimated blood loss was minimal. Procedure:                Pre-Anesthesia Assessment:                           - Prior to the procedure, a History and Physical                            was performed, and patient medications and                            allergies were reviewed. The patient's tolerance of                            previous anesthesia was also reviewed. The risks                            and benefits of the procedure and the sedation                            options and risks were discussed with the patient.                            All questions were answered, and informed consent                            was obtained. Prior Anticoagulants: The patient has                            taken Eliquis (apixaban), last dose was 2 days                            prior to procedure. ASA Grade Assessment: III - A  patient with severe systemic disease. After                            reviewing the risks and benefits, the patient was                            deemed in satisfactory condition to undergo the                            procedure.                           After obtaining informed consent, the endoscope was                            passed under direct vision. Throughout the                             procedure, the patient's blood pressure, pulse, and                            oxygen saturations were monitored continuously. The                            [id number]-[id number] ([id number]) Olympus endoscope was introduced                            through the mouth, and advanced to the second part                            of duodenum. The [id number]-[id number] ([id number]) Olympus                            duodenoscope was introduced through the mouth, and                            advanced to the area of papilla. The [id number]                            ([id number]) Olympus linear ultrasound scope was                            introduced through the mouth, and advanced to the                            duodenum for ultrasound examination from the                            stomach and duodenum. The upper EUS was                            accomplished without difficulty. The patient  tolerated the procedure. Scope In: Scope Out: Findings:      ENDOSCOPIC FINDING: :      No gross lesions were noted in the entire esophagus.      The Z-line was irregular and was found 40 cm from the incisors.      A 4 cm hiatal hernia was present.      No gross lesions were noted in the entire examined stomach.      No gross lesions were noted in the duodenal bulb, in the first portion       of the duodenum and in the second portion of the duodenum.      The ampulla was normal.      ENDOSONOGRAPHIC FINDING: :      An irregular mass was identified in the pancreatic head. The mass was       hypoechoic. The mass measured 26 mm by 22 mm in maximal cross-sectional       diameter. The endosonographic borders were poorly-defined. The remainder       of the pancreas was examined. The endosonographic appearance of       parenchyma and the upstream pancreatic duct indicated duct dilation and       parenchymal atrophy. Once the target lesion in the head of the pancreas       was  identified a preloaded fiducial markers in a Medtronic 22 gauge       needle was then deployed in and around the lesion. This was repeated for       a total of four markers (2 total needle accentuations). Impression:               EGD Impression:                           - No gross lesions in esophagus. Z-line irregular,                            40 cm from the incisors.                           - 4 cm hiatal hernia.                           - No gross lesions in the stomach.                           - No gross lesions in the duodenal bulb, in the                            first portion of the duodenum and in the second                            portion of the duodenum.                           - Normal ampulla.                           EUS Impression:                           -  A mass was identified in the pancreatic head. A                            tissue diagnosis was obtained prior to this exam.                            This is of adenocarcinoma. Fiducial markers were                            deployed. Recommendation:           - The patient will be observed post-procedure,                            until all discharge criteria are met.                           - Discharge patient to home.                           - Patient has a contact number available for                            emergencies. The signs and symptoms of potential                            delayed complications were discussed with the                            patient. Return to normal activities tomorrow.                            Written discharge instructions were provided to the                            patient.                           - Low fat diet for 1 week.                           - Monitor for signs/symptoms of pancreatitis,                            bleeding, perforation, and infection. If issues                            please call our number to get further assistance as                             needed.                           - Observe patient's clinical course.                           -  Eliquis restart on 8/12 AM to decrease risk of                            post-interventional bleeding.                           - Continue present medications.                           - The findings and recommendations were discussed                            with the patient.                           - The findings and recommendations were discussed                            with the patient's family. Procedure Code(s):        --- Professional ---                           [phone], Esophagogastroduodenoscopy, flexible,                            transoral; with transendoscopic ultrasound-guided                            transmural injection of diagnostic or therapeutic                            substance(s) (eg, anesthetic, neurolytic agent) or                            fiducial marker(s) (includes endoscopic ultrasound                            examination of the esophagus, stomach, and either                            the duodenum or a surgically altered stomach where                            the jejunum is examined distal to the anastomosis) Diagnosis Code(s):        --- Professional ---                           K22.8, Other specified diseases of esophagus                           K44.9, Diaphragmatic hernia without obstruction or                            gangrene                           C25.0,  Malignant neoplasm of head of pancreas                           C25.9, Malignant neoplasm of pancreas, unspecified                           Z08, Encounter for follow-up examination after                            completed treatment for malignant neoplasm CPT copyright 2019 American Medical Association. All rights reserved. The codes documented in this report are preliminary and upon coder review may  be revised to meet current compliance  requirements. Justice Britain, MD 04/16/2022 11:16:55 AM Number of Addenda: 0

## 2022-04-16 NOTE — H&P (Signed)
GASTROENTEROLOGY PROCEDURE H&P NOTE   Primary Care Physician: [name], MD  HPI: [name] [name] is a 81 y.o. male who presents for EGD/EUS for fiducial placement in setting of pancreatic adenocarcinoma.  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 ([place])    Prostate cancer metastatic to bone ([place])    REM sleep behavior disorder    Sleep apnea    BiPap   Past Surgical History:  Procedure Laterality Date   APPENDECTOMY  1963   BIOPSY  12/25/2021   Procedure: BIOPSY;  Surgeon: Irving Copas., MD;  Location: [place];  Service: Gastroenterology;;   COLONOSCOPY  2010, 2015   ESOPHAGOGASTRODUODENOSCOPY (EGD) WITH PROPOFOL N/A 12/25/2021   Procedure: ESOPHAGOGASTRODUODENOSCOPY (EGD) WITH PROPOFOL;  Surgeon: Irving Copas., MD;  Location: [place];  Service: Gastroenterology;  Laterality: N/A;   EUS N/A 12/25/2021   Procedure: UPPER ENDOSCOPIC ULTRASOUND (EUS) RADIAL;  Surgeon: Irving Copas., MD;  Location: [place];  Service: Gastroenterology;  Laterality: N/A;   FINE NEEDLE ASPIRATION  12/25/2021   Procedure: FINE NEEDLE ASPIRATION (FNA) LINEAR;  Surgeon: Irving Copas., MD;  Location: [facility];  Service: Gastroenterology;;   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   KYPHOPLASTY N/A 08/15/2020   Procedure: L2 compression fracture;  Surgeon: Hessie Knows, MD;  Location: [facility];  Service: Orthopedics;  Laterality: N/A;   KYPHOPLASTY N/A 08/29/2020   Procedure: T8 KYPHOPLASTY;  Surgeon: Hessie Knows, MD;  Location: [facility];  Service: Orthopedics;  Laterality: N/A;   KYPHOPLASTY N/A 11/12/2020   Procedure: L1 KYPHOPLASTY;  Surgeon: Hessie Knows, MD;  Location: [facility];  Service: Orthopedics;  Laterality:  N/A;   PORTACATH PLACEMENT N/A 02/05/2022   Procedure: INSERTION PORT-A-CATH;  Surgeon: Dwan Bolt, MD;  Location: [facility];  Service: General;  Laterality: N/A;   Current Facility-Administered Medications  Medication Dose Route Frequency Provider Last Rate Last Admin   0.9 %  sodium chloride infusion   Intravenous Continuous Mansouraty, Telford Nab., MD       lactated ringers infusion   Intravenous Continuous Mansouraty, Telford Nab., MD 10 mL/hr at 04/16/22 0835 New Bag at 04/16/22 0835    Current Facility-Administered Medications:    0.9 %  sodium chloride infusion, , Intravenous, Continuous, Mansouraty, Telford Nab., MD   lactated ringers infusion, , Intravenous, Continuous, Mansouraty, Telford Nab., MD, Last Rate: 10 mL/hr at 04/16/22 0835, New Bag at 04/16/22 0835 Allergies  Allergen Reactions   Influenza Vaccines Anaphylaxis    Flu shot: 1974 anaphylaxis. 2nd time swollen arm   Family History  Problem Relation Age of Onset   Heart disease Maternal Grandfather    Diabetes Paternal Grandfather    Breast cancer Daughter    Social History   Socioeconomic History   Marital status: Widowed    Spouse name: Genette   Number of children: 5   Years of education: Not on file   Highest education level: Not on file  Occupational History   Occupation: retired    Comment: Nutritional therapist  Tobacco Use   Smoking status: Former    Types: Cigars    Quit date: 09/07/2010    Years since quitting: 11.6   Smokeless tobacco: Never  Vaping Use   Vaping Use: Never  used  Substance and Sexual Activity   Alcohol use: Yes    Comment: 1 can beer/day. occas.   Drug use: Never   Sexual activity: Not Currently  Other Topics Concern   Not on file  Social History Narrative   Lives in [place]; quit smoking in 2012; ocassional alcohol; Camera operator; wife; 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    Social Determinants of Health   Financial Resource Strain: Not on file  Food Insecurity: Not on file   Transportation Needs: Not on file  Physical Activity: Not on file  Stress: Not on file  Social Connections: Not on file  Intimate Partner Violence: Not on file    Physical Exam: Today's Vitals   04/16/22 0813  BP: (!) 132/96  Pulse: (!) 56  Resp: 18  Temp: (!) 96.6 F (35.9 C)  TempSrc: Tympanic  SpO2: 96%  Weight: 70.3 kg  Height: '5\' 5"'$  (1.651 m)  PainSc: 0-No pain   Body mass index is 25.79 kg/m. GEN: NAD EYE: Sclerae anicteric ENT: MMM CV: Non-tachycardic GI: Soft, NT/ND NEURO:  Alert & Oriented x 3  Lab Results: Recent Labs    04/14/22 1237  WBC 5.1  HGB 10.7*  HCT 32.8*  PLT 136*   BMET Recent Labs    04/14/22 1237  NA 141  K 4.7  CL 109  CO2 29  GLUCOSE 111*  BUN 16  CREATININE 1.16  CALCIUM 9.4   LFT Recent Labs    04/14/22 1237  PROT 7.0  ALBUMIN 4.2  AST 24  ALT <5  ALKPHOS 48  BILITOT 0.8   PT/INR No results for input(s): "LABPROT", "INR" in the last 72 hours.   Impression / Plan: This is a 81 y.o.male who presents for EGD/EUS for fiducial placement in setting of pancreatic adenocarcinoma.  The risks of an EUS including intestinal perforation, bleeding, infection, aspiration, and medication effects were discussed as was the possibility it may not give a definitive diagnosis if a biopsy is performed.  When a biopsy of the pancreas is done as part of the EUS, there is an additional risk of pancreatitis at the rate of about 1-2%.  It was explained that procedure related pancreatitis is typically mild, although it can be severe and even life threatening, which is why we do not perform random pancreatic biopsies and only biopsy a lesion/area we feel is concerning enough to warrant the risk.  The risks and benefits of endoscopic evaluation/treatment were discussed with the patient and/or family; these include but are not limited to the risk of perforation, infection, bleeding, missed lesions, lack of diagnosis, severe illness requiring  hospitalization, as well as anesthesia and sedation related illnesses.  The patient's history has been reviewed, patient examined, no change in status, and deemed stable for procedure.  The patient and/or family is agreeable to proceed.    Justice Britain, MD [facility] Office # [phone]

## 2022-04-16 NOTE — Anesthesia Postprocedure Evaluation (Signed)
Anesthesia Post Note  Patient: [name] [name]  Procedure(s) Performed: UPPER ENDOSCOPIC ULTRASOUND (EUS) RADIAL FIDUCIAL MARKER PLACEMENT ESOPHAGOGASTRODUODENOSCOPY (EGD)     Patient location during evaluation: Endoscopy Anesthesia Type: MAC Level of consciousness: awake and alert Pain management: pain level controlled Vital Signs Assessment: post-procedure vital signs reviewed and stable Respiratory status: spontaneous breathing, nonlabored ventilation, respiratory function stable and patient connected to nasal cannula oxygen Cardiovascular status: blood pressure returned to baseline and stable Postop Assessment: no apparent nausea or vomiting Anesthetic complications: no   No notable events documented.  Last Vitals:  Vitals:   04/16/22 1115 04/16/22 1130  BP: 127/75 132/73  Pulse: (!) 52 61  Resp: 16 14  Temp:  (!) 36.4 C  SpO2: 98% 96%    Last Pain:  Vitals:   04/16/22 1130  TempSrc:   PainSc: 0-No pain                 Barnet Glasgow

## 2022-04-17 ENCOUNTER — Ambulatory Visit: Payer: Medicare Other | Admitting: Internal Medicine

## 2022-04-17 DIAGNOSIS — C61 Malignant neoplasm of prostate: Secondary | ICD-10-CM | POA: Diagnosis not present

## 2022-04-17 DIAGNOSIS — Z5111 Encounter for antineoplastic chemotherapy: Secondary | ICD-10-CM | POA: Diagnosis not present

## 2022-04-18 ENCOUNTER — Encounter (HOSPITAL_COMMUNITY): Payer: Self-pay | Admitting: Gastroenterology

## 2022-04-20 NOTE — Progress Notes (Incomplete)
as armband been applied?  Yes  Does patient have an allergy to IV contrast dye?: No   Has patient ever received premedication for IV contrast dye?:  n/a  Does patient take metformin?: No   If patient does take metformin when was the last dose: n/a  Date of lab work: 04/14/2022 BUN: 16 CR: 1.16 eGfr: >60   IV site: RAC  Has IV site been added to flowsheet?  Yes  BP (!) 92/52   Pulse 62   Temp 97.6 F (36.4 C)   Wt 156 lb 3.2 oz (70.9 kg)   SpO2 99%   BMI 25.99 kg/m     Daxtyn Rottenberg M. Leonie Green, BSN

## 2022-04-21 ENCOUNTER — Ambulatory Visit
Admission: RE | Admit: 2022-04-21 | Discharge: 2022-04-21 | Disposition: A | Discharge status: Home / Self Care | Payer: Medicare Other | Source: Ambulatory Visit | Attending: Radiation Oncology | Admitting: Radiation Oncology

## 2022-04-21 ENCOUNTER — Ambulatory Visit: Admission: RE | Admit: 2022-04-21 | Payer: Medicare Other | Source: Ambulatory Visit | Admitting: Radiation Oncology

## 2022-04-21 ENCOUNTER — Other Ambulatory Visit: Payer: Self-pay

## 2022-04-21 VITALS — BP 92/52 | HR 62 | Temp 97.6°F | Wt 156.2 lb

## 2022-04-21 DIAGNOSIS — C250 Malignant neoplasm of head of pancreas: Secondary | ICD-10-CM

## 2022-04-21 DIAGNOSIS — Z8546 Personal history of malignant neoplasm of prostate: Secondary | ICD-10-CM | POA: Diagnosis not present

## 2022-04-21 DIAGNOSIS — Z510 Encounter for antineoplastic radiation therapy: Secondary | ICD-10-CM | POA: Diagnosis not present

## 2022-04-21 MED ORDER — SODIUM CHLORIDE 0.9% FLUSH
10.0000 mL | Freq: Once | INTRAVENOUS | Status: AC
  Administered 2022-04-21: 10 mL via INTRAVENOUS

## 2022-04-30 ENCOUNTER — Ambulatory Visit: Payer: Medicare Other

## 2022-04-30 ENCOUNTER — Ambulatory Visit: Payer: Medicare Other | Admitting: Hematology

## 2022-04-30 ENCOUNTER — Other Ambulatory Visit: Payer: Medicare Other

## 2022-04-30 DIAGNOSIS — C250 Malignant neoplasm of head of pancreas: Secondary | ICD-10-CM | POA: Diagnosis not present

## 2022-05-01 DIAGNOSIS — Z510 Encounter for antineoplastic radiation therapy: Secondary | ICD-10-CM | POA: Diagnosis not present

## 2022-05-01 DIAGNOSIS — Z8546 Personal history of malignant neoplasm of prostate: Secondary | ICD-10-CM | POA: Diagnosis not present

## 2022-05-01 DIAGNOSIS — C250 Malignant neoplasm of head of pancreas: Secondary | ICD-10-CM | POA: Diagnosis not present

## 2022-05-04 ENCOUNTER — Other Ambulatory Visit: Payer: Self-pay

## 2022-05-04 ENCOUNTER — Ambulatory Visit
Admission: RE | Admit: 2022-05-04 | Discharge: 2022-05-04 | Disposition: A | Discharge status: Home / Self Care | Payer: Medicare Other | Source: Ambulatory Visit | Attending: Radiation Oncology | Admitting: Radiation Oncology

## 2022-05-04 DIAGNOSIS — Z8546 Personal history of malignant neoplasm of prostate: Secondary | ICD-10-CM | POA: Diagnosis not present

## 2022-05-04 DIAGNOSIS — C250 Malignant neoplasm of head of pancreas: Secondary | ICD-10-CM | POA: Diagnosis not present

## 2022-05-04 DIAGNOSIS — Z510 Encounter for antineoplastic radiation therapy: Secondary | ICD-10-CM | POA: Diagnosis not present

## 2022-05-04 LAB — RAD ONC ARIA SESSION SUMMARY
Course Elapsed Days: 0
Plan Fractions Treated to Date: 1
Plan Prescribed Dose Per Fraction: 6.6 Gy
Plan Total Fractions Prescribed: 5
Plan Total Prescribed Dose: 33 Gy
Reference Point Dosage Given to Date: 6.6 Gy
Reference Point Session Dosage Given: 6.6 Gy
Session Number: 1

## 2022-05-04 NOTE — Progress Notes (Unsigned)
Assessment/Plan:   1.  Parkinsons Disease  -continue carbidopa/levodopa 25/100, 3 tablets at 7 AM, 2 tablets at 11 AM, 2 tablets at 3 PM, 2 tablets at 7 PM  -Continue carbidopa/levodopa 50/200 CR at bedtime  -refer to PT  2.  RBD  -Receiving clonazepam, 0.5 mg, 2 tablets at night.  PDMP reviewed.  I am worried that he picked up 30 pills on July 24 from 1 prescriber and another 75 on August 6 from a different prescriber.  These are not prescribed by me, but dose is a bit worrisome.  All prescriptions, however, are coming out of [place].  [facility] is dispensing some of the prescriptions and [facility] some of them.  3.  OSAS  -On BiPAP and followed by [facility] neurology.  4.  Atrial flutter  -On Eliquis  -On diltiazem  5.  Low blood pressure  -Due to combination of diltiazem and Parkinson's disease.  Following with cardiology in [place] now.  He is aware of blood pressure issues.  Pt asymptommatic currently  6.  Metastatic prostate cancer  -Following with oncology  7.  Compression fractures.  -Has had several, with kyphoplasty at the L1 level.  Following with pain management.  8.  Pancreatic cancer  -Following with oncology Subjective:   [name] [name] was seen today in follow up for Parkinsons disease.  My previous records were reviewed prior to todays visit as well as outside records available to me.  Patient was officially diagnosed with pancreatic cancer since our last visit.  Current prescribed movement disorder medications: carbidopa/levodopa 25/100, 3 tablets at 7 AM/2 at 11 AM/2 at 3 PM/2 tablet at 7 PM carbidopa/levodopa 50/200 CR at bed  Clonazepam 0.5 mg at bedtime      PREVIOUS MEDICATIONS: opicapone (took 2 weeks of samples and d/c)    ALLERGIES:   Allergies  Allergen Reactions   Influenza Vaccines Anaphylaxis    Flu shot: 1974 anaphylaxis. 2nd time swollen arm    CURRENT MEDICATIONS:  Outpatient Encounter Medications as of 05/05/2022   Medication Sig   acetaminophen (TYLENOL) 500 MG tablet Take 1 tablet (500 mg total) by mouth every 6 (six) hours as needed for moderate pain or mild pain.   apixaban (ELIQUIS) 5 MG TABS tablet Take 1 tablet (5 mg total) by mouth 2 (two) times daily.   carbidopa-levodopa (SINEMET CR) 50-200 MG tablet TAKE 1 TABLET AT BEDTIME   carbidopa-levodopa (SINEMET IR) 25-100 MG tablet 3 tablets at 7 AM/2 at 11 AM/2 tablets at 3 PM/2 tablet at 7 PM (Patient taking differently: Take 2-3 tablets by mouth See admin instructions. Take 3 tablets at 7 AM,2 tables at 11 AM, 2 tablets at 1 PM and 2 tables at  3 pm)   clonazePAM (KLONOPIN) 0.5 MG tablet Take 0.5 mg by mouth at bedtime.   diltiazem (CARDIZEM) 30 MG tablet Take 1 tablet (30 mg total) by mouth 2 (two) times daily.   diphenhydrAMINE (BENADRYL) 25 MG tablet Take 25 mg by mouth at bedtime.   Glycerin-Hypromellose-PEG 400 (VISINE DRY EYE OP) Place 1 drop into both eyes 2 (two) times daily as needed (eye irritation).    ketoconazole (NIZORAL) 2 % shampoo Apply 1 application  topically 3 (three) times a week.   leuprolide, 6 Month, (ELIGARD) 45 MG injection Inject 45 mg into the skin every 6 (six) months.   ondansetron (ZOFRAN) 8 MG tablet Take 1 tablet (8 mg total) by mouth 2 (two) times daily as needed (Nausea  or vomiting). (Patient taking differently: Take 8 mg by mouth 2 (two) times daily as needed for vomiting or nausea.)   prochlorperazine (COMPAZINE) 10 MG tablet Take 1 tablet (10 mg total) by mouth every 6 (six) hours as needed (Nausea or vomiting). (Patient taking differently: Take 10 mg by mouth every 6 (six) hours as needed for vomiting or nausea.)   rosuvastatin (CRESTOR) 10 MG tablet Take 1 tablet (10 mg total) by mouth daily. (Patient taking differently: Take 10 mg by mouth at bedtime.)   triamcinolone cream (KENALOG) 0.1 % Apply 1 application topically 2 (two) times daily. (Patient taking differently: Apply 1 application  topically 2 (two) times  daily as needed (itching).)   zoledronic acid (RECLAST) 5 MG/100ML SOLN injection Inject 5 mg into the vein See admin instructions. Once a year   No facility-administered encounter medications on file as of 05/05/2022.    Objective:   PHYSICAL EXAMINATION:    VITALS:   There were no vitals filed for this visit.     GEN:  The patient appears stated age and is in NAD. HEENT:  Normocephalic, atraumatic.  The mucous membranes are moist. The superficial temporal arteries are without ropiness or tenderness.   Neurological examination:  Orientation: The patient is alert and oriented x3. Cranial nerves: There is good facial symmetry with facial hypomimia. The speech is fluent and clear. Soft palate rises symmetrically and there is no tongue deviation. Hearing is intact to conversational tone. Sensation: Sensation is intact to light touch throughout Motor: Strength is at least antigravity x4.  Movement examination: Tone: There is nl tone in the UE/LE Abnormal movements: no dyskinesia today Coordination:  There is mild decremation with RAM's, with any form of RAMS, including alternating supination and pronation of the forearm, hand opening and closing, finger taps, heel taps and toe taps, Gait and Station: The patient arises pretty well without use of the hands.  He has start hesitation but once he gets going he does pretty well.  I have reviewed and interpreted the following labs independently    Chemistry      Component Value Date/Time   NA 141 04/14/2022 1237   NA 139 12/04/2021 1359   K 4.7 04/14/2022 1237   CL 109 04/14/2022 1237   CO2 29 04/14/2022 1237   BUN 16 04/14/2022 1237   BUN 20 12/04/2021 1359   CREATININE 1.16 04/14/2022 1237   CREATININE 1.28 (H) 04/09/2022 1355      Component Value Date/Time   CALCIUM 9.4 04/14/2022 1237   ALKPHOS 48 04/14/2022 1237   AST 24 04/14/2022 1237   AST 26 04/09/2022 1355   ALT <5 04/14/2022 1237   ALT <5 04/09/2022 1355    BILITOT 0.8 04/14/2022 1237   BILITOT 0.8 04/09/2022 1355       Lab Results  Component Value Date   WBC 5.1 04/14/2022   HGB 10.7 (L) 04/14/2022   HCT 32.8 (L) 04/14/2022   MCV 99.7 04/14/2022   PLT 136 (L) 04/14/2022    Lab Results  Component Value Date   TSH 1.217 08/16/2019    Total time spent on today's visit was *** minutes, including both face-to-face time and nonface-to-face time.  Time included that spent on review of records (prior notes available to me/labs/imaging if pertinent), discussing treatment and goals, answering patient's questions and coordinating care.   Cc:  [name], MD

## 2022-05-05 ENCOUNTER — Ambulatory Visit (INDEPENDENT_AMBULATORY_CARE_PROVIDER_SITE_OTHER): Payer: Medicare Other | Admitting: Neurology

## 2022-05-05 ENCOUNTER — Encounter: Payer: Self-pay | Admitting: Neurology

## 2022-05-05 ENCOUNTER — Ambulatory Visit: Payer: Medicare Other | Admitting: Radiation Oncology

## 2022-05-05 VITALS — BP 135/74 | HR 60 | Ht 65.0 in | Wt 155.0 lb

## 2022-05-05 DIAGNOSIS — G903 Multi-system degeneration of the autonomic nervous system: Secondary | ICD-10-CM | POA: Diagnosis not present

## 2022-05-05 DIAGNOSIS — G20 Parkinson's disease: Secondary | ICD-10-CM

## 2022-05-05 DIAGNOSIS — C259 Malignant neoplasm of pancreas, unspecified: Secondary | ICD-10-CM | POA: Diagnosis not present

## 2022-05-05 MED ORDER — INBRIJA 42 MG IN CAPS: ORAL_CAPSULE | RESPIRATORY_TRACT | 0 refills | Status: DC

## 2022-05-05 NOTE — Patient Instructions (Signed)
We are going to start inbrija.  Remember that TWO capsules is ONE dosage (never inhale just one capsule).  You can inhale the capsules as needed up to 5 times per day, separated by 2 hour intervals.  Many patients use this right when the wake up to help with first morning "on" and then as needed during the day.  You should take a sip of water prior to using the inhaler to avoid side effects.  It may generate some cough right when you use it and that is normal.  There are nurse educators available to help you with this device.  You can call [phone] and they will set you up with a nurse educator to assist you for free of charge.  They are available 8am-8pm Monday-Friday.

## 2022-05-06 ENCOUNTER — Ambulatory Visit
Admission: RE | Admit: 2022-05-06 | Discharge: 2022-05-06 | Disposition: A | Discharge status: Home / Self Care | Payer: Medicare Other | Source: Ambulatory Visit | Attending: Radiation Oncology | Admitting: Radiation Oncology

## 2022-05-06 ENCOUNTER — Other Ambulatory Visit: Payer: Self-pay

## 2022-05-06 DIAGNOSIS — Z8546 Personal history of malignant neoplasm of prostate: Secondary | ICD-10-CM | POA: Diagnosis not present

## 2022-05-06 DIAGNOSIS — Z510 Encounter for antineoplastic radiation therapy: Secondary | ICD-10-CM | POA: Diagnosis not present

## 2022-05-06 DIAGNOSIS — C250 Malignant neoplasm of head of pancreas: Secondary | ICD-10-CM | POA: Diagnosis not present

## 2022-05-06 LAB — RAD ONC ARIA SESSION SUMMARY
Course Elapsed Days: 2
Plan Fractions Treated to Date: 2
Plan Prescribed Dose Per Fraction: 6.6 Gy
Plan Total Fractions Prescribed: 5
Plan Total Prescribed Dose: 33 Gy
Reference Point Dosage Given to Date: 13.2 Gy
Reference Point Session Dosage Given: 6.6 Gy
Session Number: 2

## 2022-05-07 ENCOUNTER — Ambulatory Visit: Payer: Medicare Other | Admitting: Radiation Oncology

## 2022-05-08 ENCOUNTER — Ambulatory Visit
Admission: RE | Admit: 2022-05-08 | Discharge: 2022-05-08 | Disposition: A | Discharge status: Home / Self Care | Payer: Medicare Other | Source: Ambulatory Visit | Attending: Radiation Oncology | Admitting: Radiation Oncology

## 2022-05-08 ENCOUNTER — Other Ambulatory Visit: Payer: Self-pay

## 2022-05-08 DIAGNOSIS — C250 Malignant neoplasm of head of pancreas: Secondary | ICD-10-CM | POA: Diagnosis not present

## 2022-05-08 DIAGNOSIS — Z510 Encounter for antineoplastic radiation therapy: Secondary | ICD-10-CM | POA: Insufficient documentation

## 2022-05-08 DIAGNOSIS — Z8546 Personal history of malignant neoplasm of prostate: Secondary | ICD-10-CM | POA: Diagnosis not present

## 2022-05-08 LAB — RAD ONC ARIA SESSION SUMMARY
Course Elapsed Days: 4
Plan Fractions Treated to Date: 3
Plan Prescribed Dose Per Fraction: 6.6 Gy
Plan Total Fractions Prescribed: 5
Plan Total Prescribed Dose: 33 Gy
Reference Point Dosage Given to Date: 19.8 Gy
Reference Point Session Dosage Given: 6.6 Gy
Session Number: 3

## 2022-05-12 ENCOUNTER — Other Ambulatory Visit: Payer: Self-pay

## 2022-05-12 ENCOUNTER — Ambulatory Visit
Admission: RE | Admit: 2022-05-12 | Discharge: 2022-05-12 | Disposition: A | Discharge status: Home / Self Care | Payer: Medicare Other | Source: Ambulatory Visit | Attending: Radiation Oncology | Admitting: Radiation Oncology

## 2022-05-12 DIAGNOSIS — C250 Malignant neoplasm of head of pancreas: Secondary | ICD-10-CM | POA: Diagnosis not present

## 2022-05-12 DIAGNOSIS — Z510 Encounter for antineoplastic radiation therapy: Secondary | ICD-10-CM | POA: Diagnosis not present

## 2022-05-12 DIAGNOSIS — Z8546 Personal history of malignant neoplasm of prostate: Secondary | ICD-10-CM | POA: Diagnosis not present

## 2022-05-12 LAB — RAD ONC ARIA SESSION SUMMARY
Course Elapsed Days: 8
Plan Fractions Treated to Date: 4
Plan Prescribed Dose Per Fraction: 6.6 Gy
Plan Total Fractions Prescribed: 5
Plan Total Prescribed Dose: 33 Gy
Reference Point Dosage Given to Date: 26.4 Gy
Reference Point Session Dosage Given: 6.6 Gy
Session Number: 4

## 2022-05-13 ENCOUNTER — Inpatient Hospital Stay: Payer: Medicare Other | Admitting: Hematology

## 2022-05-13 ENCOUNTER — Inpatient Hospital Stay: Payer: Medicare Other

## 2022-05-14 ENCOUNTER — Ambulatory Visit
Admission: RE | Admit: 2022-05-14 | Discharge: 2022-05-14 | Disposition: A | Discharge status: Home / Self Care | Payer: Medicare Other | Source: Ambulatory Visit | Attending: Radiation Oncology | Admitting: Radiation Oncology

## 2022-05-14 ENCOUNTER — Ambulatory Visit: Payer: Medicare Other

## 2022-05-14 ENCOUNTER — Encounter: Payer: Self-pay | Admitting: Radiation Oncology

## 2022-05-14 ENCOUNTER — Other Ambulatory Visit: Payer: Self-pay

## 2022-05-14 DIAGNOSIS — C250 Malignant neoplasm of head of pancreas: Secondary | ICD-10-CM | POA: Diagnosis not present

## 2022-05-14 DIAGNOSIS — Z8546 Personal history of malignant neoplasm of prostate: Secondary | ICD-10-CM | POA: Diagnosis not present

## 2022-05-14 DIAGNOSIS — Z510 Encounter for antineoplastic radiation therapy: Secondary | ICD-10-CM | POA: Diagnosis not present

## 2022-05-14 LAB — RAD ONC ARIA SESSION SUMMARY
Course Elapsed Days: 10
Plan Fractions Treated to Date: 5
Plan Prescribed Dose Per Fraction: 6.6 Gy
Plan Total Fractions Prescribed: 5
Plan Total Prescribed Dose: 33 Gy
Reference Point Dosage Given to Date: 33 Gy
Reference Point Session Dosage Given: 6.6 Gy
Session Number: 5

## 2022-05-18 ENCOUNTER — Encounter: Payer: Self-pay | Admitting: Hematology

## 2022-05-18 ENCOUNTER — Encounter: Payer: Self-pay | Admitting: Internal Medicine

## 2022-05-18 NOTE — Progress Notes (Addendum)
  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   No unexpected difficulties.  The patient's breathing did not significantly change during the course of the treatment.  Plan: The patient will receive a call in about one month from the radiation oncology department. He will continue follow up with Dr. Burr Medico in medical oncology, and Dr. Diamantina Providence as well for his prostate cancer.  ________________________________     Carola Rhine, PAC

## 2022-05-20 DIAGNOSIS — M5459 Other low back pain: Secondary | ICD-10-CM | POA: Diagnosis not present

## 2022-05-20 DIAGNOSIS — M6281 Muscle weakness (generalized): Secondary | ICD-10-CM | POA: Diagnosis not present

## 2022-05-20 DIAGNOSIS — G20 Parkinson's disease: Secondary | ICD-10-CM | POA: Diagnosis not present

## 2022-05-21 ENCOUNTER — Inpatient Hospital Stay: Payer: Medicare Other | Attending: Internal Medicine

## 2022-05-21 ENCOUNTER — Other Ambulatory Visit (HOSPITAL_COMMUNITY): Payer: Self-pay

## 2022-05-21 ENCOUNTER — Other Ambulatory Visit: Payer: Self-pay

## 2022-05-21 ENCOUNTER — Inpatient Hospital Stay (HOSPITAL_BASED_OUTPATIENT_CLINIC_OR_DEPARTMENT_OTHER): Payer: Medicare Other | Admitting: Hematology

## 2022-05-21 VITALS — BP 106/66 | HR 55 | Temp 97.8°F | Resp 14 | Wt 153.3 lb

## 2022-05-21 DIAGNOSIS — Z79899 Other long term (current) drug therapy: Secondary | ICD-10-CM | POA: Insufficient documentation

## 2022-05-21 DIAGNOSIS — Z905 Acquired absence of kidney: Secondary | ICD-10-CM | POA: Insufficient documentation

## 2022-05-21 DIAGNOSIS — Z9049 Acquired absence of other specified parts of digestive tract: Secondary | ICD-10-CM | POA: Diagnosis not present

## 2022-05-21 DIAGNOSIS — Z887 Allergy status to serum and vaccine status: Secondary | ICD-10-CM | POA: Insufficient documentation

## 2022-05-21 DIAGNOSIS — Z7901 Long term (current) use of anticoagulants: Secondary | ICD-10-CM | POA: Diagnosis not present

## 2022-05-21 DIAGNOSIS — K5900 Constipation, unspecified: Secondary | ICD-10-CM | POA: Insufficient documentation

## 2022-05-21 DIAGNOSIS — R109 Unspecified abdominal pain: Secondary | ICD-10-CM | POA: Diagnosis not present

## 2022-05-21 DIAGNOSIS — N183 Chronic kidney disease, stage 3 unspecified: Secondary | ICD-10-CM | POA: Diagnosis not present

## 2022-05-21 DIAGNOSIS — C61 Malignant neoplasm of prostate: Secondary | ICD-10-CM | POA: Insufficient documentation

## 2022-05-21 DIAGNOSIS — K861 Other chronic pancreatitis: Secondary | ICD-10-CM | POA: Diagnosis not present

## 2022-05-21 DIAGNOSIS — R197 Diarrhea, unspecified: Secondary | ICD-10-CM | POA: Diagnosis not present

## 2022-05-21 DIAGNOSIS — C7951 Secondary malignant neoplasm of bone: Secondary | ICD-10-CM | POA: Insufficient documentation

## 2022-05-21 DIAGNOSIS — C250 Malignant neoplasm of head of pancreas: Secondary | ICD-10-CM

## 2022-05-21 DIAGNOSIS — G20 Parkinson's disease: Secondary | ICD-10-CM | POA: Insufficient documentation

## 2022-05-21 DIAGNOSIS — R918 Other nonspecific abnormal finding of lung field: Secondary | ICD-10-CM | POA: Diagnosis not present

## 2022-05-21 DIAGNOSIS — C259 Malignant neoplasm of pancreas, unspecified: Secondary | ICD-10-CM

## 2022-05-21 LAB — CMP (CANCER CENTER ONLY)
ALT: <5 U/L (ref 0–44)
AST: 21 U/L (ref 15–41)
Albumin: 4.2 g/dL (ref 3.5–5.0)
Alkaline Phosphatase: 48 U/L (ref 38–126)
Anion gap: 4 — ABNORMAL LOW (ref 5–15)
BUN: 17 mg/dL (ref 8–23)
CO2: 28 mmol/L (ref 22–32)
Calcium: 9.8 mg/dL (ref 8.9–10.3)
Chloride: 109 mmol/L (ref 98–111)
Creatinine: 1.04 mg/dL (ref 0.61–1.24)
GFR, Estimated: >60 mL/min (ref >60)
Glucose, Bld: 120 mg/dL — ABNORMAL HIGH (ref 70–99)
Potassium: 4.4 mmol/L (ref 3.5–5.1)
Sodium: 141 mmol/L (ref 135–145)
Total Bilirubin: 0.7 mg/dL (ref 0.3–1.2)
Total Protein: 6.7 g/dL (ref 6.5–8.1)

## 2022-05-21 LAB — CBC WITH DIFFERENTIAL (CANCER CENTER ONLY)
Abs Immature Granulocytes: 0.01 10*3/uL (ref 0.00–0.07)
Basophils Absolute: 0.1 10*3/uL (ref 0.0–0.1)
Basophils Relative: 1 %
Eosinophils Absolute: 0.4 10*3/uL (ref 0.0–0.5)
Eosinophils Relative: 7 %
HCT: 37.3 % — ABNORMAL LOW (ref 39.0–52.0)
Hemoglobin: 12 g/dL — ABNORMAL LOW (ref 13.0–17.0)
Immature Granulocytes: 0 %
Lymphocytes Relative: 17 %
Lymphs Abs: 0.9 10*3/uL (ref 0.7–4.0)
MCH: 32.3 pg (ref 26.0–34.0)
MCHC: 32.2 g/dL (ref 30.0–36.0)
MCV: 100.5 fL — ABNORMAL HIGH (ref 80.0–100.0)
Monocytes Absolute: 0.6 10*3/uL (ref 0.1–1.0)
Monocytes Relative: 12 %
Neutro Abs: 3.4 10*3/uL (ref 1.7–7.7)
Neutrophils Relative %: 63 %
Platelet Count: 175 10*3/uL (ref 150–400)
RBC: 3.71 MIL/uL — ABNORMAL LOW (ref 4.22–5.81)
RDW: 13.3 % (ref 11.5–15.5)
WBC Count: 5.4 10*3/uL (ref 4.0–10.5)
nRBC: 0 % (ref 0.0–0.2)

## 2022-05-21 MED ORDER — PANCRELIPASE (LIP-PROT-AMYL) 36000-114000 UNITS PO CPEP: 36000.0000 [IU] | ORAL_CAPSULE | Freq: Three times a day (TID) | ORAL | 0 refills | Status: DC

## 2022-05-21 MED ORDER — OMEPRAZOLE 20 MG PO CPDR: 20.0000 mg | DELAYED_RELEASE_CAPSULE | Freq: Every day | ORAL | 2 refills | Status: DC

## 2022-05-21 MED ORDER — PANCRELIPASE (LIP-PROT-AMYL) 36000-114000 UNITS PO CPEP
36000.0000 [IU] | ORAL_CAPSULE | Freq: Three times a day (TID) | ORAL | 0 refills | Status: DC
  Filled 2022-05-21: qty 90, 30d supply, fill #0

## 2022-05-21 NOTE — Telephone Encounter (Signed)
[facility] pharmacy sent fax stating that Creon/ 36,000 units is backordered and non available in a 50 mile radius. Called pt and he is OK with sending prescription to [facility]. Spoke with Aaron Edelman at [facility] and they do have Creon in stock.

## 2022-05-21 NOTE — Progress Notes (Incomplete)
[place]   [phone] [phone]   Clinic Follow up Note   Patient Care Team: [name], MD as PCP - General (Internal Medicine) Tat, Eustace Quail, DO as Consulting Physician (Neurology) Cammie Sickle, MD as Consulting Physician (Hematology and Oncology) Dwan Bolt, MD as Consulting Physician (General Surgery) Truitt Merle, MD as Consulting Physician (Hematology and Oncology)  Date of Service:  05/21/2022  CHIEF COMPLAINT: f/u of pancreatic cancer  CURRENT THERAPY:  Surveillance  ASSESSMENT & PLAN:  [name] [name] is a 81 y.o. male with   1. Pancreatic adenocarcinoma, stage IB cT2N0M0 -presented with abdominal discomfort. CT AP on 10/02/21, and MRI/MRCP on 10/28/21 showed 1.8 cm mass in pancreatic head and uncinate. 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 Staged at T2 N0. -baseline CA 19-9 from 01/02/22 was 730. -he met with Dr. Zenia Resides and is felt not to be a good surgical candidate given his multiple medical comorbidities. -staging chest CT showed no definite evidence of metastatic disease. -he received single agent gemcitabine on 01/13/22 - 04/01/22. He tolerated well overall  -post-treatment CT CAP on 04/09/22 showed stable pancreatic head mass, no evidence of metastatic disease.  -he recently completed 5 fractions SBRT to pancreas under Dr. [name], 05/04/22 - 05/14/22. -he will now move to surveillance. We will repeat a CT scan 3 months after RT   2. Abdominal pain, constipation -he reports an occasional heavy pain in his stomach. He endorses using tylenol for pain. -he reports irregular BM, mostly constipation. Managed with miralax daily. -They asked about creon, and I will call this in to see if his insurance will cover it. He also uses prilosec.  3. Metastatic prostate cancer  -initially diagnosed with Gleason 4+5 prostate cancer in  04/2019 for elevated PSA of 26.5. Staging CT AP on 05/16/19 showed two suspicious left pelvic lesions. He was started on Eligard by Dr. Diamantina Providence in urology; receiving q73month, next due ~7/26.  -Patient also receives Reclast once a year. -he did not tolerate Zytiga -Last PSA drawn on 10/13/21 showed good response at 0.06.  -due for Eligard, will receive per urologist -restaging CT CAP 04/09/22 showed stable osseous metastasis at left iliac and pubic bones.     Plan:  -I called in creon and refilled prilosec  -f/u in 3 months, with lab and CT several days before   No problem-specific Assessment & Plan notes found for this encounter.   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 Sclerotic 1.5 cm left ischial lesion/ Sclerotic medial left iliac bone 1.5 cm lesion (series 2/image 47), increased from 1.0 cm. Gleason score of 4+5= 9; with almost all cores involved greater than 80%.  9/16 Lupron [id number] on 9/16. [Urology; Dr.Siniski]  # MID OCT 2020- Zytiga 1000 mg+ prednisone; stopped December 2021 [poor tolerance if with RVR]; DISCONTINUED.   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       Prostate cancer metastatic to bone ([facility] 05/24/2019 Initial Diagnosis   [facility]  Pancreatic cancer ([facility] 10/02/2021 Imaging   CT ABDOMEN PELVIS W CONTRAST   IMPRESSION: 1.  There is new pancreatic ductal dilation with an indeterminate 19 mm hypodense low-density mass area in the head/uncinate process of the pancreas. Recommend further evaluation with dedicated MRI/MRCP with contrast. 2. No evidence of bowel obstruction.  Moderate rectal stool ball. 3. Scattered peripherally located  clustered pulmonary nodules in the RIGHT middle lobe. Findings are likely infectious or inflammatory in etiology. Given history of malignancy, recommend follow-up as per clinical protocol.   10/28/2021 Imaging   MR ABDOMEN MRCP W WO CONTAST   IMPRESSION: 1. Examination is significantly limited by breath motion artifact throughout. 2. The main pancreatic duct is diffusely dilated from the level of the superior pancreatic head, measuring up to 0.7 cm. 3. In the inferior pancreatic head and uncinate, there is a multilobulated, fluid signal cystic lesion measuring 1.8 x 1.0 cm. Due to breath motion artifact it is difficult to determine whether this communicates to the adjacent duct. There are multiple additional subcentimeter cystic lesions scattered throughout the pancreas, several of which clearly communicate to the main pancreatic duct. 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 Recommend EUS/FNA for further diagnosis given the presence of pancreatic ductal dilatation. 4. Status post left nephrectomy. 5. No evidence of recurrent or metastatic disease in the abdomen.   12/25/2021 Procedure   UPPER ENDOSCOPIC ULTRASOUND-By Dr. Rush Landmark  - A mass-like region was identified in the pancreatic head where the pancreatic duct dilates with multiple cystic regions noted throughout the pancreas as well. 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 However, the endosonographic appearance is suspicious for potential adenocarcinoma. This was staged T2 N0 Mx by endosonographic criteria. T - No malignant-appearing lymph nodes were visualized in the celiac region (level 20), peripancreatic region and porta hepatis region.   12/25/2021 Pathology Results   CYTOLOGY - NON PAP  CASE: [id number]   FINAL MICROSCOPIC DIAGNOSIS:  A. PANCREAS, HEAD, FINE NEEDLE ASPIRATION:  - Malignant cells consistent with adenocarcinoma      01/01/2022 Initial Diagnosis   Pancreatic cancer ([place])   01/13/2022 -  Chemotherapy   Patient is on Treatment Plan : PANCREAS Gemcitabine D1,15 q28d x 4 Cycles     01/26/2022 Cancer Staging   Staging form: Exocrine Pancreas, AJCC 8th Edition - Clinical: Stage IB (cT2, cN0, cM0) - Signed by Truitt Merle, MD on 01/26/2022 Total positive nodes: 0    Genetic Testing   [facility] CancerNext-Expanded Panel was Negative. Of note, a variant of uncertain significance was identified in the BLM gene (p.[id number]) and GALNT12 gene ([id number]). Report date is 01/26/2022.  The CancerNext-Expanded gene panel offered by [facility] and includes sequencing, rearrangement, and RNA analysis for the following 77 genes: AIP, ALK, APC, ATM, AXIN2, BAP1, BARD1, BLM, BMPR1A, BRCA1, BRCA2, BRIP1, CDC73, CDH1, CDK4, CDKN1B, CDKN2A, CHEK2, CTNNA1, DICER1, FANCC, FH, FLCN, GALNT12, KIF1B, LZTR1, MAX, MEN1, MET, MLH1, MSH2, MSH3, MSH6, MUTYH, NBN, NF1, NF2, NTHL1, PALB2, PHOX2B, PMS2, POT1, PRKAR1A, PTCH1, PTEN, RAD51C, RAD51D, RB1, RECQL, RET, SDHA, SDHAF2, SDHB, SDHC, SDHD, SMAD4, SMARCA4, SMARCB1, SMARCE1, STK11, SUFU, TMEM127, TP53, TSC1, TSC2, VHL and XRCC2 (sequencing and deletion/duplication); EGFR, EGLN1, HOXB13, KIT, MITF, PDGFRA, POLD1, and POLE (sequencing only); EPCAM and GREM1 (deletion/duplication only).       INTERVAL HISTORY:  [name] [name] is here for a follow up of pancreatic cancer. He was last seen by me on 04/10/22. He presents to the clinic accompanied by his daughter, with another daughter on speaker phone. He reports he is doing well overall. He reports he  occasionally gets a heavy pain in his stomach, "like a bowling ball." He notes his BM aren't regular, mostly constipation but sometimes diarrhea, and excess gas.   All other systems were reviewed with the patient and are negative.  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 ([place])    Prostate cancer metastatic to bone ([place])    REM sleep behavior disorder    Sleep apnea    BiPap    SURGICAL HISTORY: Past Surgical History:  Procedure Laterality Date   APPENDECTOMY  1963   BIOPSY  12/25/2021   Procedure: BIOPSY;  Surgeon: Irving Copas., MD;  Location: [facility] ENDOSCOPY;  Service: Gastroenterology;;   COLONOSCOPY  2010, 2015   ESOPHAGOGASTRODUODENOSCOPY N/A 04/16/2022   Procedure: ESOPHAGOGASTRODUODENOSCOPY (EGD);  Surgeon: Irving Copas., MD;  Location: [place];  Service: Gastroenterology;  Laterality: N/A;   ESOPHAGOGASTRODUODENOSCOPY (EGD) WITH PROPOFOL N/A 12/25/2021   Procedure: ESOPHAGOGASTRODUODENOSCOPY (EGD) WITH PROPOFOL;  Surgeon: Rush Landmark Telford Nab., MD;  Location: [place];  Service: Gastroenterology;  Laterality: N/A;   EUS N/A 12/25/2021   Procedure: UPPER ENDOSCOPIC ULTRASOUND (EUS) RADIAL;  Surgeon: Irving Copas., MD;  Location: [place];  Service: Gastroenterology;  Laterality: N/A;   EUS N/A 04/16/2022   Procedure: UPPER ENDOSCOPIC ULTRASOUND (EUS) RADIAL;  Surgeon: Irving Copas., MD;  Location: [place];  Service: Gastroenterology;  Laterality: N/A;   FIDUCIAL MARKER PLACEMENT N/A 04/16/2022   Procedure: FIDUCIAL MARKER PLACEMENT;  Surgeon: Irving Copas., MD;  Location: [place];  Service: Gastroenterology;  Laterality: N/A;   FINE NEEDLE ASPIRATION  12/25/2021   Procedure: FINE NEEDLE ASPIRATION (FNA) LINEAR;  Surgeon: Irving Copas., MD;  Location: [facility] ENDOSCOPY;  Service: Gastroenterology;;   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   KYPHOPLASTY N/A 08/15/2020   Procedure: L2 compression fracture;  Surgeon: Hessie Knows, MD;  Location: [facility] ORS;  Service: Orthopedics;  Laterality: N/A;   KYPHOPLASTY  N/A 08/29/2020   Procedure: T8 KYPHOPLASTY;  Surgeon: Hessie Knows, MD;  Location: [facility] ORS;  Service: Orthopedics;  Laterality: N/A;   KYPHOPLASTY N/A 11/12/2020   Procedure: L1 KYPHOPLASTY;  Surgeon: Hessie Knows, MD;  Location: [facility] ORS;  Service: Orthopedics;  Laterality: N/A;   PORTACATH PLACEMENT N/A 02/05/2022   Procedure: INSERTION PORT-A-CATH;  Surgeon: Dwan Bolt, MD;  Location: [facility] ORS;  Service: General;  Laterality: N/A;    I have reviewed the social history and family history with the patient and they are unchanged from previous note.  ALLERGIES:  is allergic to influenza vaccines.  MEDICATIONS:  Current Outpatient Medications  Medication Sig Dispense Refill   acetaminophen (TYLENOL) 500 MG tablet Take 1 tablet (500 mg total) by mouth every 6 (six) hours as needed for moderate pain or mild pain. 30 tablet 0   apixaban (ELIQUIS) 5 MG TABS tablet Take 1 tablet (5 mg total) by mouth 2 (two) times daily. 60 tablet 1   carbidopa-levodopa (SINEMET CR) 50-200 MG tablet TAKE 1 TABLET AT BEDTIME 90 tablet 1   carbidopa-levodopa (SINEMET IR) 25-100 MG tablet 3 tablets at 7 AM/2 at 11 AM/2 tablets at 3 PM/2 tablet at 7 PM (Patient taking differently: Take 2-3 tablets by mouth See admin instructions. Take 3 tablets at 7 AM,2  tables at 11 AM, 2 tablets at 1 PM and 2 tables at  3 pm) 810 tablet 3   clonazePAM (KLONOPIN) 0.5 MG tablet Take 0.5 mg by mouth at bedtime.     diltiazem (CARDIZEM) 30 MG tablet Take 1 tablet (30 mg total) by mouth 2 (two) times daily. 180 tablet 1   diphenhydrAMINE (BENADRYL) 25 MG tablet Take 25 mg by mouth at bedtime.     Glycerin-Hypromellose-PEG 400 (VISINE DRY EYE OP) Place 1 drop into both eyes 2 (two) times daily as needed (eye irritation).      ketoconazole (NIZORAL) 2 % shampoo Apply 1 application  topically 3 (three) times a week.     leuprolide, 6 Month, (ELIGARD) 45 MG injection Inject 45 mg into the skin every 6 (six) months.     Levodopa (INBRIJA) 42  MG CAPS Inhale 2 capsules prn up to 5 times per day 60 capsule 0   ondansetron (ZOFRAN) 8 MG tablet Take 1 tablet (8 mg total) by mouth 2 (two) times daily as needed (Nausea or vomiting). (Patient taking differently: Take 8 mg by mouth 2 (two) times daily as needed for vomiting or nausea.) 30 tablet 1   prochlorperazine (COMPAZINE) 10 MG tablet Take 1 tablet (10 mg total) by mouth every 6 (six) hours as needed (Nausea or vomiting). (Patient taking differently: Take 10 mg by mouth every 6 (six) hours as needed for vomiting or nausea.) 30 tablet 1   rosuvastatin (CRESTOR) 10 MG tablet Take 1 tablet (10 mg total) by mouth daily. (Patient taking differently: Take 10 mg by mouth at bedtime.) 90 tablet 3   triamcinolone cream (KENALOG) 0.1 % Apply 1 application topically 2 (two) times daily. (Patient taking differently: Apply 1 application  topically 2 (two) times daily as needed (itching).) 453.6 g 1   zoledronic acid (RECLAST) 5 MG/100ML SOLN injection Inject 5 mg into the vein See admin instructions. Once a year     No current facility-administered medications for this visit.    PHYSICAL EXAMINATION: ECOG PERFORMANCE STATUS: {CHL ONC ECOG [phone]}  There were no vitals filed for this visit. Wt Readings from Last 3 Encounters:  05/05/22 155 lb (70.3 kg)  04/21/22 156 lb 3.2 oz (70.9 kg)  04/16/22 155 lb (70.3 kg)     GENERAL:alert, no distress and comfortable SKIN: skin color, texture, turgor are normal, no rashes or significant lesions EYES: normal, Conjunctiva are pink and non-injected, sclera clear  NECK: supple, thyroid normal size, non-tender, without nodularity LYMPH:  no palpable lymphadenopathy in the cervical, axillary  LUNGS: clear to auscultation and percussion with normal breathing effort HEART: regular rate & rhythm and no murmurs and no lower extremity edema ABDOMEN:abdomen soft, non-tender and normal bowel sounds Musculoskeletal:no cyanosis of digits and no clubbing   NEURO: alert & oriented x 3 with fluent speech, no focal motor/sensory deficits  LABORATORY DATA:  I have reviewed the data as listed    Latest Ref Rng & Units 05/21/2022    1:14 PM 04/14/2022   12:37 PM 04/09/2022    1:55 PM  CBC  WBC 4.0 - 10.5 K/uL 5.4  5.1  3.3   Hemoglobin 13.0 - 17.0 g/dL 12.0  10.7  10.5   Hematocrit 39.0 - 52.0 % 37.3  32.8  31.5   Platelets 150 - 400 K/uL 175  136  211         Latest Ref Rng & Units 04/14/2022   12:37 PM 04/09/2022    1:55 PM  04/01/2022    9:31 AM  CMP  Glucose 70 - 99 mg/dL 111  103  84   BUN 8 - 23 mg/dL _0 Creatinine 0.61 - 1.24 mg/dL 1.16  1.28  1.21   Sodium 135 - 145 mmol/L 141  140  141   Potassium 3.5 - 5.1 mmol/L 4.7  4.8  4.0   Chloride 98 - 111 mmol/L 109  106  109   CO2 22 - 32 mmol/L _1 Calcium 8.9 - 10.3 mg/dL 9.4  9.5  9.6   Total Protein 6.5 - 8.1 g/dL 7.0  7.2  7.0   Total Bilirubin 0.3 - 1.2 mg/dL 0.8  0.8  0.9   Alkaline Phos 38 - 126 U/L 48  53  54   AST 15 - 41 U/L _2 ALT 0 - 44 U/L <5  <5  <5       RADIOGRAPHIC STUDIES: I have personally reviewed the radiological images as listed and agreed with the findings in the report. No results found.    No orders of the defined types were placed in this encounter.  All questions were answered. The patient knows to call the clinic with any problems, questions or concerns. No barriers to learning was detected. The total time spent in the appointment was {CHL ONC TIME VISIT - [id number]}.     Aurea Graff 05/21/2022   I, Wilburn Mylar, am acting as scribe for Truitt Merle, MD.   {Add scribe attestation statement}

## 2022-05-22 ENCOUNTER — Other Ambulatory Visit (HOSPITAL_COMMUNITY): Payer: Self-pay

## 2022-05-23 ENCOUNTER — Encounter: Payer: Self-pay | Admitting: Hematology

## 2022-05-23 LAB — CANCER ANTIGEN 19-9: CA 19-9: 1146 U/mL — ABNORMAL HIGH (ref 0–35)

## 2022-05-24 ENCOUNTER — Encounter: Payer: Self-pay | Admitting: Hematology

## 2022-05-24 ENCOUNTER — Encounter: Payer: Self-pay | Admitting: Internal Medicine

## 2022-05-25 DIAGNOSIS — G20 Parkinson's disease: Secondary | ICD-10-CM | POA: Diagnosis not present

## 2022-05-25 DIAGNOSIS — M6281 Muscle weakness (generalized): Secondary | ICD-10-CM | POA: Diagnosis not present

## 2022-05-25 DIAGNOSIS — M5459 Other low back pain: Secondary | ICD-10-CM | POA: Diagnosis not present

## 2022-05-27 DIAGNOSIS — M6281 Muscle weakness (generalized): Secondary | ICD-10-CM | POA: Diagnosis not present

## 2022-05-27 DIAGNOSIS — M5459 Other low back pain: Secondary | ICD-10-CM | POA: Diagnosis not present

## 2022-05-27 DIAGNOSIS — G20 Parkinson's disease: Secondary | ICD-10-CM | POA: Diagnosis not present

## 2022-06-01 DIAGNOSIS — G20 Parkinson's disease: Secondary | ICD-10-CM | POA: Diagnosis not present

## 2022-06-01 DIAGNOSIS — M5459 Other low back pain: Secondary | ICD-10-CM | POA: Diagnosis not present

## 2022-06-01 DIAGNOSIS — M6281 Muscle weakness (generalized): Secondary | ICD-10-CM | POA: Diagnosis not present

## 2022-06-03 DIAGNOSIS — M5459 Other low back pain: Secondary | ICD-10-CM | POA: Diagnosis not present

## 2022-06-03 DIAGNOSIS — G20 Parkinson's disease: Secondary | ICD-10-CM | POA: Diagnosis not present

## 2022-06-03 DIAGNOSIS — M6281 Muscle weakness (generalized): Secondary | ICD-10-CM | POA: Diagnosis not present

## 2022-06-04 ENCOUNTER — Telehealth: Payer: Self-pay | Admitting: Hematology

## 2022-06-04 NOTE — Telephone Encounter (Signed)
Scheduled follow-up appointments per 9/14 los. Patient is aware. 

## 2022-06-10 DIAGNOSIS — M6281 Muscle weakness (generalized): Secondary | ICD-10-CM | POA: Diagnosis not present

## 2022-06-10 DIAGNOSIS — G20C Parkinsonism, unspecified: Secondary | ICD-10-CM | POA: Diagnosis not present

## 2022-06-10 DIAGNOSIS — M5459 Other low back pain: Secondary | ICD-10-CM | POA: Diagnosis not present

## 2022-06-12 DIAGNOSIS — G20C Parkinsonism, unspecified: Secondary | ICD-10-CM | POA: Diagnosis not present

## 2022-06-12 DIAGNOSIS — M6281 Muscle weakness (generalized): Secondary | ICD-10-CM | POA: Diagnosis not present

## 2022-06-12 DIAGNOSIS — M5459 Other low back pain: Secondary | ICD-10-CM | POA: Diagnosis not present

## 2022-06-16 DIAGNOSIS — G20C Parkinsonism, unspecified: Secondary | ICD-10-CM | POA: Diagnosis not present

## 2022-06-16 DIAGNOSIS — M5459 Other low back pain: Secondary | ICD-10-CM | POA: Diagnosis not present

## 2022-06-16 DIAGNOSIS — M6281 Muscle weakness (generalized): Secondary | ICD-10-CM | POA: Diagnosis not present

## 2022-06-17 ENCOUNTER — Other Ambulatory Visit: Payer: Self-pay | Admitting: Cardiology

## 2022-06-17 ENCOUNTER — Inpatient Hospital Stay: Payer: Medicare Other | Attending: Internal Medicine

## 2022-06-17 ENCOUNTER — Other Ambulatory Visit: Payer: Self-pay

## 2022-06-17 DIAGNOSIS — R109 Unspecified abdominal pain: Secondary | ICD-10-CM | POA: Insufficient documentation

## 2022-06-17 DIAGNOSIS — Z79899 Other long term (current) drug therapy: Secondary | ICD-10-CM | POA: Insufficient documentation

## 2022-06-17 DIAGNOSIS — C250 Malignant neoplasm of head of pancreas: Secondary | ICD-10-CM | POA: Insufficient documentation

## 2022-06-17 DIAGNOSIS — C61 Malignant neoplasm of prostate: Secondary | ICD-10-CM | POA: Insufficient documentation

## 2022-06-17 DIAGNOSIS — C259 Malignant neoplasm of pancreas, unspecified: Secondary | ICD-10-CM

## 2022-06-17 DIAGNOSIS — K5900 Constipation, unspecified: Secondary | ICD-10-CM | POA: Diagnosis not present

## 2022-06-17 DIAGNOSIS — C7951 Secondary malignant neoplasm of bone: Secondary | ICD-10-CM | POA: Insufficient documentation

## 2022-06-17 LAB — CBC WITH DIFFERENTIAL (CANCER CENTER ONLY)
Abs Immature Granulocytes: 0.01 K/uL (ref 0.00–0.07)
Basophils Absolute: 0.1 K/uL (ref 0.0–0.1)
Basophils Relative: 2 %
Eosinophils Absolute: 0.3 K/uL (ref 0.0–0.5)
Eosinophils Relative: 9 %
HCT: 39.5 % (ref 39.0–52.0)
Hemoglobin: 12.7 g/dL — ABNORMAL LOW (ref 13.0–17.0)
Immature Granulocytes: 0 %
Lymphocytes Relative: 27 %
Lymphs Abs: 0.9 K/uL (ref 0.7–4.0)
MCH: 31.4 pg (ref 26.0–34.0)
MCHC: 32.2 g/dL (ref 30.0–36.0)
MCV: 97.8 fL (ref 80.0–100.0)
Monocytes Absolute: 0.4 K/uL (ref 0.1–1.0)
Monocytes Relative: 12 %
Neutro Abs: 1.8 K/uL (ref 1.7–7.7)
Neutrophils Relative %: 50 %
Platelet Count: 170 K/uL (ref 150–400)
RBC: 4.04 MIL/uL — ABNORMAL LOW (ref 4.22–5.81)
RDW: 12.8 % (ref 11.5–15.5)
WBC Count: 3.4 K/uL — ABNORMAL LOW (ref 4.0–10.5)
nRBC: 0 % (ref 0.0–0.2)

## 2022-06-17 LAB — CMP (CANCER CENTER ONLY)
ALT: 5 U/L (ref 0–44)
AST: 23 U/L (ref 15–41)
Albumin: 4.2 g/dL (ref 3.5–5.0)
Alkaline Phosphatase: 55 U/L (ref 38–126)
Anion gap: 3 — ABNORMAL LOW (ref 5–15)
BUN: 14 mg/dL (ref 8–23)
CO2: 30 mmol/L (ref 22–32)
Calcium: 9.5 mg/dL (ref 8.9–10.3)
Chloride: 109 mmol/L (ref 98–111)
Creatinine: 1.1 mg/dL (ref 0.61–1.24)
GFR, Estimated: >60 mL/min
Glucose, Bld: 94 mg/dL (ref 70–99)
Potassium: 4.5 mmol/L (ref 3.5–5.1)
Sodium: 142 mmol/L (ref 135–145)
Total Bilirubin: 0.6 mg/dL (ref 0.3–1.2)
Total Protein: 7.2 g/dL (ref 6.5–8.1)

## 2022-06-18 ENCOUNTER — Other Ambulatory Visit: Payer: Self-pay

## 2022-06-18 ENCOUNTER — Encounter: Payer: Self-pay | Admitting: Cardiology

## 2022-06-18 DIAGNOSIS — M5459 Other low back pain: Secondary | ICD-10-CM | POA: Diagnosis not present

## 2022-06-18 DIAGNOSIS — M6281 Muscle weakness (generalized): Secondary | ICD-10-CM | POA: Diagnosis not present

## 2022-06-18 DIAGNOSIS — G20C Parkinsonism, unspecified: Secondary | ICD-10-CM | POA: Diagnosis not present

## 2022-06-18 MED ORDER — APIXABAN 5 MG PO TABS: 5.0000 mg | ORAL_TABLET | Freq: Two times a day (BID) | ORAL | 3 refills | Status: DC

## 2022-06-19 LAB — CANCER ANTIGEN 19-9: CA 19-9: 811 U/mL — ABNORMAL HIGH (ref 0–35)

## 2022-06-22 ENCOUNTER — Telehealth: Payer: Self-pay

## 2022-06-22 ENCOUNTER — Ambulatory Visit
Admission: RE | Admit: 2022-06-22 | Discharge: 2022-06-22 | Disposition: A | Discharge status: Home / Self Care | Payer: Medicare Other | Source: Ambulatory Visit | Attending: Hematology | Admitting: Hematology

## 2022-06-22 NOTE — Progress Notes (Addendum)
  Radiation Oncology         [phone] ________________________________  Name: [name] [name] MRN: [id number]  Date of Service: 06/22/2022  DOB: 1940/11/09  Post Treatment Telephone Note  Diagnosis:  Stage IB, cT2N0M0, adenocarcinoma of the head of the pancreas   Indication for treatment: curative   Radiation treatment dates:   05/04/22-05/14/22   Site/dose:    Site Technique Total Dose (Gy) Dose per Fx (Gy) Completed Fx Beam Energies  Pancreas: Pancreas IMRT 33/33 6.6 5/5 6XFFF  (as documented in provider EOT note)   The patient was available for call today.   Symptoms of fatigue have not improved since completing therapy.  Symptoms of skin changes have  improved since completing therapy.  Symptoms of nausea or vomiting have  improved since completing therapy.   The patient has scheduled follow up with his medical oncologist Dr. Burr Medico for ongoing surveillance, and was encouraged to call if he  develop concerns or questions regarding radiation.    Leandra Kern, LPN

## 2022-06-22 NOTE — Telephone Encounter (Signed)
This nurse left a message for patient related to lab results and provider recommendations mentioned below.  Patient knows to call clinic with questions or concerns.

## 2022-06-22 NOTE — Telephone Encounter (Signed)
-----   Message from Evalee Jefferson, RN sent at 06/22/2022  3:35 PM EDT -----  ----- Message ----- From: Truitt Merle, MD Sent: 06/20/2022   1:33 PM EDT To: Evalee Jefferson, RN  Please let pt know his tumor marker has dropped, which is good and probably related to his radiation. Continue monitoring, thanks.   Truitt Merle

## 2022-06-23 ENCOUNTER — Encounter: Payer: Self-pay | Admitting: Hematology

## 2022-06-23 ENCOUNTER — Other Ambulatory Visit: Payer: Self-pay

## 2022-06-23 DIAGNOSIS — M5459 Other low back pain: Secondary | ICD-10-CM | POA: Diagnosis not present

## 2022-06-23 DIAGNOSIS — M6281 Muscle weakness (generalized): Secondary | ICD-10-CM | POA: Diagnosis not present

## 2022-06-23 DIAGNOSIS — G20C Parkinsonism, unspecified: Secondary | ICD-10-CM | POA: Diagnosis not present

## 2022-06-23 MED ORDER — OMEPRAZOLE 20 MG PO CPDR: 20.0000 mg | DELAYED_RELEASE_CAPSULE | Freq: Every day | ORAL | 0 refills | Status: DC

## 2022-06-25 ENCOUNTER — Encounter: Payer: Self-pay | Admitting: Cardiology

## 2022-06-25 ENCOUNTER — Ambulatory Visit: Payer: Medicare Other | Admitting: Cardiology

## 2022-06-25 VITALS — BP 106/61 | HR 53 | Temp 97.3°F | Resp 16 | Ht 65.0 in | Wt 153.8 lb

## 2022-06-25 DIAGNOSIS — I700 Atherosclerosis of aorta: Secondary | ICD-10-CM | POA: Diagnosis not present

## 2022-06-25 DIAGNOSIS — Z7901 Long term (current) use of anticoagulants: Secondary | ICD-10-CM | POA: Diagnosis not present

## 2022-06-25 DIAGNOSIS — I4892 Unspecified atrial flutter: Secondary | ICD-10-CM

## 2022-06-25 DIAGNOSIS — M5459 Other low back pain: Secondary | ICD-10-CM | POA: Diagnosis not present

## 2022-06-25 DIAGNOSIS — G4733 Obstructive sleep apnea (adult) (pediatric): Secondary | ICD-10-CM

## 2022-06-25 DIAGNOSIS — E782 Mixed hyperlipidemia: Secondary | ICD-10-CM | POA: Diagnosis not present

## 2022-06-25 DIAGNOSIS — G20C Parkinsonism, unspecified: Secondary | ICD-10-CM | POA: Diagnosis not present

## 2022-06-25 DIAGNOSIS — M6281 Muscle weakness (generalized): Secondary | ICD-10-CM | POA: Diagnosis not present

## 2022-06-25 NOTE — Progress Notes (Signed)
Date:  06/25/2022   ID:  [name], DOB Jan 06, 1941, MRN [id number]  PCP:  [name], MD  Cardiologist:  Rex Kras, DO, [facility] (established care 04/10/2021) Former Cardiologist: Isaias Cowman, MD  Date: 06/25/22 Last Office Visit: 12/23/2021  Chief Complaint  Patient presents with   Atrial Flutter   Follow-up    HPI  [name] [name] is a 81 y.o. 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  Patient is being followed by the practice for his history of paroxysmal atrial flutter.  He was diagnosed with atrial flutter back in 2020 at an outside facility and since then has been on diltiazem for rate control strategy and Eliquis for thromboembolic prophylaxis.  Since last office visit patient underwent EUS and other diagnostic studies and states that was diagnosed with pancreatic cancer.  He has undergone radiation and chemotherapy and is scheduled for repeat scan in December 2023.  He denies anginal discomfort or heart failure symptoms.  Overall functional capacity remains relatively acceptable for his age and recent course of events.  FUNCTIONAL STATUS: Walks about 0.5 miles per day.  ALLERGIES: Allergies  Allergen Reactions   Influenza Vaccines Anaphylaxis    Flu shot: 1974 anaphylaxis. 2nd time swollen arm    MEDICATION LIST PRIOR TO VISIT: Current Meds  Medication Sig   apixaban (ELIQUIS) 5 MG TABS tablet Take 1 tablet (5 mg total) by mouth 2 (two) times daily.   carbidopa-levodopa (SINEMET CR) 50-200 MG tablet TAKE 1 TABLET AT BEDTIME   carbidopa-levodopa (SINEMET IR) 25-100 MG tablet 3 tablets at 7 AM/2 at 11 AM/2 tablets at 3 PM/2 tablet at 7 PM (Patient taking differently: Take 2-3 tablets by mouth  See admin instructions. Take 3 tablets at 7 AM,2 tables at 11 AM, 2 tablets at 1 PM and 2 tables at  3 pm)   clonazePAM (KLONOPIN) 0.5 MG tablet Take 0.5 mg by mouth at bedtime.   diltiazem (CARDIZEM) 30 MG tablet Take 1 tablet (30 mg total) by mouth 2 (two) times daily.   Glycerin-Hypromellose-PEG 400 (VISINE DRY EYE OP) Place 1 drop into both eyes 2 (two) times daily as needed (eye irritation).    ketoconazole (NIZORAL) 2 % shampoo Apply 1 application  topically 3 (three) times a week.   leuprolide, 6 Month, (ELIGARD) 45 MG injection Inject 45 mg into the skin every 6 (six) months.   Levodopa (INBRIJA) 42 MG CAPS Inhale 2 capsules prn up to 5 times per day   lipase/protease/amylase (CREON) 36000 UNITS CPEP capsule Take 1 capsule (36,000 Units total) by mouth 3 (three) times daily before meals.   omeprazole (PRILOSEC) 20 MG capsule Take 1 capsule (20 mg total) by mouth daily.   ondansetron (ZOFRAN) 8 MG tablet Take 1 tablet (8 mg total) by mouth 2 (two) times daily as needed (Nausea or vomiting). (Patient taking differently: Take 8 mg by mouth 2 (two) times daily as needed for vomiting or nausea.)   prochlorperazine (COMPAZINE) 10 MG tablet Take 1 tablet (10 mg total) by mouth every 6 (six) hours as needed (Nausea or vomiting). (Patient taking differently: Take 10 mg by mouth every 6 (six) hours as needed for vomiting or nausea.)   rosuvastatin (CRESTOR) 10 MG tablet TAKE 1 TABLET DAILY   triamcinolone cream (KENALOG) 0.1 %  Apply 1 application topically 2 (two) times daily. (Patient taking differently: Apply 1 application  topically 2 (two) times daily as needed (itching).)   zoledronic acid (RECLAST) 5 MG/100ML SOLN injection Inject 5 mg into the vein See admin instructions. 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 ([place])    Prostate cancer metastatic to bone ([place])    REM sleep behavior disorder    Sleep apnea    BiPap    PAST SURGICAL HISTORY: Past Surgical History:  Procedure Laterality Date   APPENDECTOMY  1963   BIOPSY  12/25/2021   Procedure: BIOPSY;  Surgeon: Irving Copas., MD;  Location: [facility] ENDOSCOPY;  Service: Gastroenterology;;   COLONOSCOPY  2010, 2015   ESOPHAGOGASTRODUODENOSCOPY N/A 04/16/2022   Procedure: ESOPHAGOGASTRODUODENOSCOPY (EGD);  Surgeon: Irving Copas., MD;  Location: [place];  Service: Gastroenterology;  Laterality: N/A;   ESOPHAGOGASTRODUODENOSCOPY (EGD) WITH PROPOFOL N/A 12/25/2021   Procedure: ESOPHAGOGASTRODUODENOSCOPY (EGD) WITH PROPOFOL;  Surgeon: Rush Landmark Telford Nab., MD;  Location: [place];  Service: Gastroenterology;  Laterality: N/A;   EUS N/A 12/25/2021   Procedure: UPPER ENDOSCOPIC ULTRASOUND (EUS) RADIAL;  Surgeon: Irving Copas., MD;  Location: [place];  Service: Gastroenterology;  Laterality: N/A;   EUS N/A 04/16/2022   Procedure: UPPER ENDOSCOPIC ULTRASOUND (EUS) RADIAL;  Surgeon: Irving Copas., MD;  Location: [place];  Service: Gastroenterology;  Laterality: N/A;   FIDUCIAL MARKER PLACEMENT N/A 04/16/2022   Procedure: FIDUCIAL MARKER PLACEMENT;  Surgeon: Irving Copas., MD;  Location: [place];  Service: Gastroenterology;  Laterality: N/A;   FINE NEEDLE ASPIRATION  12/25/2021   Procedure: FINE NEEDLE ASPIRATION (FNA) LINEAR;  Surgeon: Irving Copas., MD;  Location: [facility] ENDOSCOPY;  Service: Gastroenterology;;   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   KYPHOPLASTY N/A 08/15/2020   Procedure: L2 compression fracture;  Surgeon: Hessie Knows, MD;  Location: [facility]S;  Service: Orthopedics;  Laterality: N/A;   KYPHOPLASTY N/A 08/29/2020   Procedure: T8 KYPHOPLASTY;  Surgeon: Hessie Knows, MD;  Location: [facility];   Service: Orthopedics;  Laterality: N/A;   KYPHOPLASTY N/A 11/12/2020   Procedure: L1 KYPHOPLASTY;  Surgeon: Hessie Knows, MD;  Location: [facility] ORS;  Service: Orthopedics;  Laterality: N/A;   PORTACATH PLACEMENT N/A 02/05/2022   Procedure: INSERTION PORT-A-CATH;  Surgeon: Dwan Bolt, MD;  Location: [facility];  Service: General;  Laterality: N/A;    FAMILY HISTORY: The patient family history includes Breast cancer in his daughter; Diabetes in his paternal grandfather; Heart disease in his maternal grandfather.  SOCIAL HISTORY:  The patient  reports that he quit smoking about 11 years ago. His smoking use included cigars. He has never used smokeless tobacco. He reports current alcohol use. He reports that he does not use drugs.  REVIEW OF SYSTEMS: Review of Systems  Cardiovascular:  Negative for chest pain, claudication, dyspnea on exertion, irregular heartbeat, leg swelling, near-syncope, orthopnea, palpitations, paroxysmal nocturnal dyspnea and syncope.  Respiratory:  Negative for shortness of breath.   Hematologic/Lymphatic: Negative for bleeding problem.  Musculoskeletal:  Negative for muscle cramps and myalgias.  Neurological:  Negative for dizziness and light-headedness.    PHYSICAL EXAM:    06/25/2022    1:30 PM 05/21/2022  1:55 PM 05/05/2022    2:53 PM  Vitals with BMI  Height '5\' 5"'$   '5\' 5"'$   Weight 153 lbs 13 oz 153 lbs 5 oz 155 lbs  BMI 25.59 54.65 03.54  Systolic 656 812 751  Diastolic 61 66 74  Pulse 53 55 60   Physical Exam  Constitutional: No distress.  Age appropriate, hemodynamically stable.   Neck: No JVD present.  Cardiovascular: Normal rate, regular rhythm, S1 normal, S2 normal, intact distal pulses and normal pulses. Exam reveals no gallop, no S3 and no S4.  Murmur heard. Holosystolic murmur is present with a grade of 3/6 at the apex. Pulses:      Dorsalis pedis pulses are 2+ on the right side and 2+ on the left side.       Posterior tibial pulses are 2+ on  the right side and 2+ on the left side.  Pulmonary/Chest: Effort normal and breath sounds normal. No stridor. He has no wheezes. He has no rales.  Abdominal: Soft. Bowel sounds are normal. He exhibits no distension. There is no abdominal tenderness.  Musculoskeletal:        General: No edema.     Cervical back: Neck supple.  Neurological: He is alert and oriented to person, place, and time. He has intact cranial nerves (2-12).  Skin: Skin is warm and moist.    CARDIAC DATABASE: EKG: 06/25/2022: Sinus bradycardia, 54 bpm, with sinus arrhythmia, right bundle branch block, without underlying injury pattern, T WI likely secondary to RBBB.  Echocardiogram: 04/30/2021: Normal LV systolic function with EF 63%. Moderate concentric hypertrophy of the left ventricle. Normal global wall motion. Left ventricle cavity is normal in size.  Doppler evidence of grade II (pseudonormal) diastolic dysfunction, elevated LAP.  Left atrial cavity is moderately dilated. Trileaflet aortic valve. Moderate (Grade II) aortic regurgitation. Mild to moderate mitral regurgitation. Mild tricuspid regurgitation.  No evidence of pulmonary hypertension.   Stress Testing: Lexiscan Tetrofosmin stress test 04/30/2021: 1 Day Rest/Stress Protocol. Stress EKG is non-diagnostic for ischemia as its a pharmacologic stress test using Lexiscan. Normal myocardial perfusion without convincing evidence of reversible myocardial ischemia or prior infarct.   Left ventricular ejection fraction is 55% with normal wall motion.   Low risk study. No prior studies for comparison.   Heart Catheterization: None  LABORATORY DATA:    Latest Ref Rng & Units 06/17/2022   11:04 AM 05/21/2022    1:14 PM 04/14/2022   12:37 PM  CBC  WBC 4.0 - 10.5 K/uL 3.4  5.4  5.1   Hemoglobin 13.0 - 17.0 g/dL 12.7  12.0  10.7   Hematocrit 39.0 - 52.0 % 39.5  37.3  32.8   Platelets 150 - 400 K/uL 170  175  136        Latest Ref Rng & Units 06/17/2022    11:04 AM 05/21/2022    1:14 PM 04/14/2022   12:37 PM  CMP  Glucose 70 - 99 mg/dL 94  120  111   BUN 8 - 23 mg/dL '14  17  16   '$ Creatinine 0.61 - 1.24 mg/dL 1.10  1.04  1.16   Sodium 135 - 145 mmol/L 142  141  141   Potassium 3.5 - 5.1 mmol/L 4.5  4.4  4.7   Chloride 98 - 111 mmol/L 109  109  109   CO2 22 - 32 mmol/L '30  28  29   '$ Calcium 8.9 - 10.3 mg/dL 9.5  9.8  9.4   Total  Protein 6.5 - 8.1 g/dL 7.2  6.7  7.0   Total Bilirubin 0.3 - 1.2 mg/dL 0.6  0.7  0.8   Alkaline Phos 38 - 126 U/L 55  48  48   AST 15 - 41 U/L '23  21  24   '$ ALT 0 - 44 U/L 5  <5  <5     Lipid Panel     Component Value Date/Time   CHOL 124 12/04/2021 1359   TRIG 146 12/04/2021 1359   HDL 66 12/04/2021 1359   CHOLHDL 3.0 08/17/2019 0445   VLDL 20 08/17/2019 0445   LDLCALC 34 12/04/2021 1359   LDLDIRECT 43 12/04/2021 1359   LABVLDL 24 12/04/2021 1359    No components found for: "NTPROBNP" No results for input(s): "PROBNP" in the last 8760 hours. No results for input(s): "TSH" in the last 8760 hours.  BMP Recent Labs    04/14/22 1237 05/21/22 1314 06/17/22 1104  NA 141 141 142  K 4.7 4.4 4.5  CL 109 109 109  CO2 '29 28 30  '$ GLUCOSE 111* 120* 94  BUN '16 17 14  '$ CREATININE 1.16 1.04 1.10  CALCIUM 9.4 9.8 9.5  GFRNONAA >60 >60 >60    HEMOGLOBIN A1C No results found for: "HGBA1C", "MPG"  IMPRESSION:    ICD-10-CM   1. Atrial flutter, paroxysmal (HCC)  I48.92 EKG 12-Lead    2. Long term (current) use of anticoagulants  Z79.01     3. Atherosclerosis of aorta (HCC)  I70.0     4. Mixed hyperlipidemia  E78.2     5. OSA treated with BiPAP  G47.33        RECOMMENDATIONS: [name] [name] is a 81 y.o. 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  Atrial  flutter, paroxysmal (HCC) Rate control: Diltiazem. Rhythm control: N/A. Thromboembolic prophylaxis: Eliquis. CHA2DS2-VASc SCORE is 3 which correlates to 3% risk of stroke per year (age, atherosclerosis).  Diagnosed 2020-per patient. No history of cardioversion or ablation. EKG shows a sinus rhythm.  Long term (current) use of anticoagulants Does not endorse evidence of bleeding. Reemphasized the risks, benefits, and alternatives to oral anticoagulation  Atherosclerosis of aorta (HCC) Continue statin therapy.  Mixed hyperlipidemia Currently on rosuvastatin.   He denies myalgia or other side effects. Currently managed by primary care provider.  FINAL MEDICATION LIST END OF ENCOUNTER: No orders of the defined types were placed in this encounter.    Medications Discontinued During This Encounter  Medication Reason   diphenhydrAMINE (BENADRYL) 25 MG tablet       Current Outpatient Medications:    apixaban (ELIQUIS) 5 MG TABS tablet, Take 1 tablet (5 mg total) by mouth 2 (two) times daily., Disp: 120 tablet, Rfl: 3   carbidopa-levodopa (SINEMET CR) 50-200 MG tablet, TAKE 1 TABLET AT BEDTIME, Disp: 90 tablet, Rfl: 1   carbidopa-levodopa (SINEMET IR) 25-100 MG tablet, 3 tablets at 7 AM/2 at 11 AM/2 tablets at 3 PM/2 tablet at 7 PM (Patient taking differently: Take 2-3 tablets by mouth See admin instructions. Take 3 tablets at 7 AM,2 tables at 11 AM, 2 tablets at 1 PM and 2 tables at  3 pm), Disp: 810 tablet, Rfl: 3   clonazePAM (KLONOPIN) 0.5 MG tablet, Take 0.5 mg by mouth at bedtime., Disp: , Rfl:  diltiazem (CARDIZEM) 30 MG tablet, Take 1 tablet (30 mg total) by mouth 2 (two) times daily., Disp: 180 tablet, Rfl: 1   Glycerin-Hypromellose-PEG 400 (VISINE DRY EYE OP), Place 1 drop into both eyes 2 (two) times daily as needed (eye irritation). , Disp: , Rfl:    ketoconazole (NIZORAL) 2 % shampoo, Apply 1 application  topically 3 (three) times a week., Disp: , Rfl:    leuprolide, 6  Month, (ELIGARD) 45 MG injection, Inject 45 mg into the skin every 6 (six) months., Disp: , Rfl:    Levodopa (INBRIJA) 42 MG CAPS, Inhale 2 capsules prn up to 5 times per day, Disp: 60 capsule, Rfl: 0   lipase/protease/amylase (CREON) 36000 UNITS CPEP capsule, Take 1 capsule (36,000 Units total) by mouth 3 (three) times daily before meals., Disp: 90 capsule, Rfl: 0   omeprazole (PRILOSEC) 20 MG capsule, Take 1 capsule (20 mg total) by mouth daily., Disp: 90 capsule, Rfl: 0   ondansetron (ZOFRAN) 8 MG tablet, Take 1 tablet (8 mg total) by mouth 2 (two) times daily as needed (Nausea or vomiting). (Patient taking differently: Take 8 mg by mouth 2 (two) times daily as needed for vomiting or nausea.), Disp: 30 tablet, Rfl: 1   prochlorperazine (COMPAZINE) 10 MG tablet, Take 1 tablet (10 mg total) by mouth every 6 (six) hours as needed (Nausea or vomiting). (Patient taking differently: Take 10 mg by mouth every 6 (six) hours as needed for vomiting or nausea.), Disp: 30 tablet, Rfl: 1   rosuvastatin (CRESTOR) 10 MG tablet, TAKE 1 TABLET DAILY, Disp: 90 tablet, Rfl: 3   triamcinolone cream (KENALOG) 0.1 %, Apply 1 application topically 2 (two) times daily. (Patient taking differently: Apply 1 application  topically 2 (two) times daily as needed (itching).), Disp: 453.6 g, Rfl: 1   zoledronic acid (RECLAST) 5 MG/100ML SOLN injection, Inject 5 mg into the vein See admin instructions. Once a year, Disp: , Rfl:    acetaminophen (TYLENOL) 500 MG tablet, Take 1 tablet (500 mg total) by mouth every 6 (six) hours as needed for moderate pain or mild pain., Disp: 30 tablet, Rfl: 0  Orders Placed This Encounter  Procedures   EKG 12-Lead   There are no Patient Instructions on file for this visit.   --Continue cardiac medications as reconciled in final medication list. --Return in about 6 months (around 12/25/2022) for Follow up, Atrial flutter. Or sooner if needed. --Continue follow-up with your primary care physician  regarding the management of your other chronic comorbid conditions.  Patient's questions and concerns were addressed to his satisfaction. He voices understanding of the instructions provided during this encounter.   This note was created using a voice recognition software as a result there may be grammatical errors inadvertently enclosed that do not reflect the nature of this encounter. Every attempt is made to correct such errors.  Rex Kras, [place], [place]  Pager: [phone] Office: [phone]

## 2022-06-26 ENCOUNTER — Other Ambulatory Visit: Payer: Self-pay

## 2022-06-29 DIAGNOSIS — M6281 Muscle weakness (generalized): Secondary | ICD-10-CM | POA: Diagnosis not present

## 2022-06-29 DIAGNOSIS — G20C Parkinsonism, unspecified: Secondary | ICD-10-CM | POA: Diagnosis not present

## 2022-06-29 DIAGNOSIS — M5459 Other low back pain: Secondary | ICD-10-CM | POA: Diagnosis not present

## 2022-07-01 DIAGNOSIS — L570 Actinic keratosis: Secondary | ICD-10-CM | POA: Diagnosis not present

## 2022-07-01 DIAGNOSIS — M5459 Other low back pain: Secondary | ICD-10-CM | POA: Diagnosis not present

## 2022-07-01 DIAGNOSIS — C4491 Basal cell carcinoma of skin, unspecified: Secondary | ICD-10-CM | POA: Diagnosis not present

## 2022-07-01 DIAGNOSIS — L814 Other melanin hyperpigmentation: Secondary | ICD-10-CM | POA: Diagnosis not present

## 2022-07-01 DIAGNOSIS — M6281 Muscle weakness (generalized): Secondary | ICD-10-CM | POA: Diagnosis not present

## 2022-07-01 DIAGNOSIS — D1801 Hemangioma of skin and subcutaneous tissue: Secondary | ICD-10-CM | POA: Diagnosis not present

## 2022-07-01 DIAGNOSIS — L853 Xerosis cutis: Secondary | ICD-10-CM | POA: Diagnosis not present

## 2022-07-01 DIAGNOSIS — G20C Parkinsonism, unspecified: Secondary | ICD-10-CM | POA: Diagnosis not present

## 2022-07-01 DIAGNOSIS — L219 Seborrheic dermatitis, unspecified: Secondary | ICD-10-CM | POA: Diagnosis not present

## 2022-07-01 DIAGNOSIS — L821 Other seborrheic keratosis: Secondary | ICD-10-CM | POA: Diagnosis not present

## 2022-07-01 DIAGNOSIS — D229 Melanocytic nevi, unspecified: Secondary | ICD-10-CM | POA: Diagnosis not present

## 2022-07-06 ENCOUNTER — Other Ambulatory Visit: Payer: Self-pay | Admitting: Hematology

## 2022-07-06 ENCOUNTER — Telehealth: Payer: Self-pay

## 2022-07-06 DIAGNOSIS — G20C Parkinsonism, unspecified: Secondary | ICD-10-CM | POA: Diagnosis not present

## 2022-07-06 DIAGNOSIS — Z23 Encounter for immunization: Secondary | ICD-10-CM | POA: Diagnosis not present

## 2022-07-06 DIAGNOSIS — M6281 Muscle weakness (generalized): Secondary | ICD-10-CM | POA: Diagnosis not present

## 2022-07-06 DIAGNOSIS — M5459 Other low back pain: Secondary | ICD-10-CM | POA: Diagnosis not present

## 2022-07-06 MED ORDER — TRAMADOL HCL 50 MG PO TABS: 50.0000 mg | ORAL_TABLET | Freq: Four times a day (QID) | ORAL | 0 refills | Status: DC | PRN

## 2022-07-06 NOTE — Telephone Encounter (Signed)
Pt called stating he's having sharp shooting pain in his pancreas area when he stand up and sometimes when he tries to walk around.  Pt rated pain at 8/10.  Pt would like something for pain if possible.  Notified Dr. Burr Medico of pt's pain and medication request.

## 2022-07-08 DIAGNOSIS — M5459 Other low back pain: Secondary | ICD-10-CM | POA: Diagnosis not present

## 2022-07-08 DIAGNOSIS — G20C Parkinsonism, unspecified: Secondary | ICD-10-CM | POA: Diagnosis not present

## 2022-07-08 DIAGNOSIS — M6281 Muscle weakness (generalized): Secondary | ICD-10-CM | POA: Diagnosis not present

## 2022-07-13 DIAGNOSIS — M5459 Other low back pain: Secondary | ICD-10-CM | POA: Diagnosis not present

## 2022-07-13 DIAGNOSIS — M6281 Muscle weakness (generalized): Secondary | ICD-10-CM | POA: Diagnosis not present

## 2022-07-13 DIAGNOSIS — G20C Parkinsonism, unspecified: Secondary | ICD-10-CM | POA: Diagnosis not present

## 2022-07-15 DIAGNOSIS — X58XXXA Exposure to other specified factors, initial encounter: Secondary | ICD-10-CM | POA: Diagnosis not present

## 2022-07-15 DIAGNOSIS — G20C Parkinsonism, unspecified: Secondary | ICD-10-CM | POA: Diagnosis not present

## 2022-07-15 DIAGNOSIS — M5459 Other low back pain: Secondary | ICD-10-CM | POA: Diagnosis not present

## 2022-07-15 DIAGNOSIS — M6281 Muscle weakness (generalized): Secondary | ICD-10-CM | POA: Diagnosis not present

## 2022-07-15 DIAGNOSIS — M67911 Unspecified disorder of synovium and tendon, right shoulder: Secondary | ICD-10-CM | POA: Diagnosis not present

## 2022-07-15 DIAGNOSIS — S51811A Laceration without foreign body of right forearm, initial encounter: Secondary | ICD-10-CM | POA: Diagnosis not present

## 2022-07-20 DIAGNOSIS — M6281 Muscle weakness (generalized): Secondary | ICD-10-CM | POA: Diagnosis not present

## 2022-07-20 DIAGNOSIS — M5459 Other low back pain: Secondary | ICD-10-CM | POA: Diagnosis not present

## 2022-07-20 DIAGNOSIS — G20C Parkinsonism, unspecified: Secondary | ICD-10-CM | POA: Diagnosis not present

## 2022-07-22 DIAGNOSIS — M5459 Other low back pain: Secondary | ICD-10-CM | POA: Diagnosis not present

## 2022-07-22 DIAGNOSIS — G20C Parkinsonism, unspecified: Secondary | ICD-10-CM | POA: Diagnosis not present

## 2022-07-22 DIAGNOSIS — M6281 Muscle weakness (generalized): Secondary | ICD-10-CM | POA: Diagnosis not present

## 2022-07-23 ENCOUNTER — Telehealth: Payer: Self-pay | Admitting: Anesthesiology

## 2022-07-23 ENCOUNTER — Other Ambulatory Visit: Payer: Self-pay

## 2022-07-23 DIAGNOSIS — G20A1 Parkinson's disease without dyskinesia, without mention of fluctuations: Secondary | ICD-10-CM

## 2022-07-23 MED ORDER — CARBIDOPA-LEVODOPA 25-100 MG PO TABS: ORAL_TABLET | ORAL | 0 refills | Status: DC

## 2022-07-23 NOTE — Telephone Encounter (Signed)
Prescription sent patient notified. 

## 2022-07-23 NOTE — Telephone Encounter (Signed)
Pt called to request  a refill on his medication   1. Which medications need refilled? Carbidopa-Levodopa 25-100 mg   2. Which pharmacy/location is medication to be sent to? [facility] on [address] and [address]  3. Do they need a 30 day or 90 day supply? 90 day supply

## 2022-07-27 DIAGNOSIS — M6281 Muscle weakness (generalized): Secondary | ICD-10-CM | POA: Diagnosis not present

## 2022-07-27 DIAGNOSIS — G20C Parkinsonism, unspecified: Secondary | ICD-10-CM | POA: Diagnosis not present

## 2022-07-27 DIAGNOSIS — M5459 Other low back pain: Secondary | ICD-10-CM | POA: Diagnosis not present

## 2022-07-28 DIAGNOSIS — H5213 Myopia, bilateral: Secondary | ICD-10-CM | POA: Diagnosis not present

## 2022-07-28 DIAGNOSIS — H2513 Age-related nuclear cataract, bilateral: Secondary | ICD-10-CM | POA: Diagnosis not present

## 2022-07-28 DIAGNOSIS — H52203 Unspecified astigmatism, bilateral: Secondary | ICD-10-CM | POA: Diagnosis not present

## 2022-08-03 DIAGNOSIS — Z136 Encounter for screening for cardiovascular disorders: Secondary | ICD-10-CM | POA: Diagnosis not present

## 2022-08-03 DIAGNOSIS — Z0000 Encounter for general adult medical examination without abnormal findings: Secondary | ICD-10-CM | POA: Diagnosis not present

## 2022-08-03 DIAGNOSIS — Z23 Encounter for immunization: Secondary | ICD-10-CM | POA: Diagnosis not present

## 2022-08-03 DIAGNOSIS — M810 Age-related osteoporosis without current pathological fracture: Secondary | ICD-10-CM | POA: Diagnosis not present

## 2022-08-03 DIAGNOSIS — G4733 Obstructive sleep apnea (adult) (pediatric): Secondary | ICD-10-CM | POA: Diagnosis not present

## 2022-08-03 DIAGNOSIS — E785 Hyperlipidemia, unspecified: Secondary | ICD-10-CM | POA: Diagnosis not present

## 2022-08-03 DIAGNOSIS — C7951 Secondary malignant neoplasm of bone: Secondary | ICD-10-CM | POA: Diagnosis not present

## 2022-08-03 DIAGNOSIS — M549 Dorsalgia, unspecified: Secondary | ICD-10-CM | POA: Diagnosis not present

## 2022-08-03 DIAGNOSIS — I4892 Unspecified atrial flutter: Secondary | ICD-10-CM | POA: Diagnosis not present

## 2022-08-03 DIAGNOSIS — C61 Malignant neoplasm of prostate: Secondary | ICD-10-CM | POA: Diagnosis not present

## 2022-08-03 DIAGNOSIS — Z1331 Encounter for screening for depression: Secondary | ICD-10-CM | POA: Diagnosis not present

## 2022-08-12 ENCOUNTER — Inpatient Hospital Stay: Payer: Medicare Other | Attending: Internal Medicine

## 2022-08-12 ENCOUNTER — Other Ambulatory Visit: Payer: Self-pay

## 2022-08-12 ENCOUNTER — Ambulatory Visit (HOSPITAL_COMMUNITY)
Admission: RE | Admit: 2022-08-12 | Discharge: 2022-08-12 | Disposition: A | Discharge status: Home / Self Care | Payer: Medicare Other | Source: Ambulatory Visit | Attending: Hematology | Admitting: Hematology

## 2022-08-12 DIAGNOSIS — I700 Atherosclerosis of aorta: Secondary | ICD-10-CM | POA: Diagnosis not present

## 2022-08-12 DIAGNOSIS — C7951 Secondary malignant neoplasm of bone: Secondary | ICD-10-CM | POA: Insufficient documentation

## 2022-08-12 DIAGNOSIS — Z9049 Acquired absence of other specified parts of digestive tract: Secondary | ICD-10-CM | POA: Insufficient documentation

## 2022-08-12 DIAGNOSIS — C250 Malignant neoplasm of head of pancreas: Secondary | ICD-10-CM | POA: Insufficient documentation

## 2022-08-12 DIAGNOSIS — R918 Other nonspecific abnormal finding of lung field: Secondary | ICD-10-CM | POA: Insufficient documentation

## 2022-08-12 DIAGNOSIS — Z7901 Long term (current) use of anticoagulants: Secondary | ICD-10-CM | POA: Insufficient documentation

## 2022-08-12 DIAGNOSIS — Z79899 Other long term (current) drug therapy: Secondary | ICD-10-CM | POA: Insufficient documentation

## 2022-08-12 DIAGNOSIS — G20A1 Parkinson's disease without dyskinesia, without mention of fluctuations: Secondary | ICD-10-CM | POA: Insufficient documentation

## 2022-08-12 DIAGNOSIS — R21 Rash and other nonspecific skin eruption: Secondary | ICD-10-CM | POA: Insufficient documentation

## 2022-08-12 DIAGNOSIS — R140 Abdominal distension (gaseous): Secondary | ICD-10-CM | POA: Insufficient documentation

## 2022-08-12 DIAGNOSIS — N183 Chronic kidney disease, stage 3 unspecified: Secondary | ICD-10-CM | POA: Insufficient documentation

## 2022-08-12 DIAGNOSIS — Z887 Allergy status to serum and vaccine status: Secondary | ICD-10-CM | POA: Insufficient documentation

## 2022-08-12 DIAGNOSIS — Z905 Acquired absence of kidney: Secondary | ICD-10-CM | POA: Insufficient documentation

## 2022-08-12 DIAGNOSIS — C259 Malignant neoplasm of pancreas, unspecified: Secondary | ICD-10-CM

## 2022-08-12 DIAGNOSIS — C61 Malignant neoplasm of prostate: Secondary | ICD-10-CM | POA: Insufficient documentation

## 2022-08-12 DIAGNOSIS — M545 Low back pain, unspecified: Secondary | ICD-10-CM | POA: Insufficient documentation

## 2022-08-12 LAB — CBC WITH DIFFERENTIAL (CANCER CENTER ONLY)
Abs Immature Granulocytes: 0.01 10*3/uL (ref 0.00–0.07)
Basophils Absolute: 0 10*3/uL (ref 0.0–0.1)
Basophils Relative: 1 %
Eosinophils Absolute: 0.1 10*3/uL (ref 0.0–0.5)
Eosinophils Relative: 2 %
HCT: 41.7 % (ref 39.0–52.0)
Hemoglobin: 13.5 g/dL (ref 13.0–17.0)
Immature Granulocytes: 0 %
Lymphocytes Relative: 24 %
Lymphs Abs: 1.3 10*3/uL (ref 0.7–4.0)
MCH: 30.1 pg (ref 26.0–34.0)
MCHC: 32.4 g/dL (ref 30.0–36.0)
MCV: 93.1 fL (ref 80.0–100.0)
Monocytes Absolute: 0.6 10*3/uL (ref 0.1–1.0)
Monocytes Relative: 10 %
Neutro Abs: 3.5 10*3/uL (ref 1.7–7.7)
Neutrophils Relative %: 63 %
Platelet Count: 208 10*3/uL (ref 150–400)
RBC: 4.48 MIL/uL (ref 4.22–5.81)
RDW: 13 % (ref 11.5–15.5)
WBC Count: 5.6 10*3/uL (ref 4.0–10.5)
nRBC: 0 % (ref 0.0–0.2)

## 2022-08-12 LAB — CMP (CANCER CENTER ONLY)
ALT: 12 U/L (ref 0–44)
AST: 30 U/L (ref 15–41)
Albumin: 4.3 g/dL (ref 3.5–5.0)
Alkaline Phosphatase: 56 U/L (ref 38–126)
Anion gap: 7 (ref 5–15)
BUN: 21 mg/dL (ref 8–23)
CO2: 27 mmol/L (ref 22–32)
Calcium: 10 mg/dL (ref 8.9–10.3)
Chloride: 112 mmol/L — ABNORMAL HIGH (ref 98–111)
Creatinine: 1.12 mg/dL (ref 0.61–1.24)
GFR, Estimated: >60 mL/min (ref >60)
Glucose, Bld: 82 mg/dL (ref 70–99)
Potassium: 4 mmol/L (ref 3.5–5.1)
Sodium: 146 mmol/L — ABNORMAL HIGH (ref 135–145)
Total Bilirubin: 0.7 mg/dL (ref 0.3–1.2)
Total Protein: 7.7 g/dL (ref 6.5–8.1)

## 2022-08-12 MED ORDER — IOHEXOL 300 MG/ML  SOLN
100.0000 mL | Freq: Once | INTRAMUSCULAR | Status: AC | PRN
  Administered 2022-08-12: 100 mL via INTRAVENOUS

## 2022-08-12 MED ORDER — SODIUM CHLORIDE (PF) 0.9 % IJ SOLN
INTRAMUSCULAR | Status: AC
  Filled 2022-08-12: qty 50

## 2022-08-13 NOTE — Progress Notes (Addendum)
[place]   Telephone:([phone]) [phone] Fax:([phone]   Clinic Follow up Note   Patient Care Team: [name], MD as PCP - General (Internal Medicine) Tat, Eustace Quail, DO as Consulting Physician (Neurology) Cammie Sickle, MD as Consulting Physician (Hematology and Oncology) Dwan Bolt, MD as Consulting Physician (General Surgery) Truitt Merle, MD as Consulting Physician (Hematology and Oncology)  Date of Service:  08/14/2022  CHIEF COMPLAINT: f/u of  pancreatic cancer    CURRENT THERAPY:  Surveillance    ASSESSMENT:  [name] [name] is a 81 y.o. 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 He tolerated well overall  -post-treatment CT CAP on 04/09/22 showed stable pancreatic head mass, no evidence of metastatic disease.  -he recently completed 5 fractions SBRT to pancreas under Dr. [name], 05/04/22 - 05/14/22. -I personally reviewed his restaging CT scan from yesterday, which showed slightly enlarged pancreatic tumor, no other new metastasis.  His tumor marker CA 19.9 has dropped significantly after radiation.  -Due to his advanced age, not symptomatic from his pancreatic cancer, I recommend continue observation.  We again reviewed the option of chemotherapy, he would like to hold chemotherapy for now.  -We also discussed palliative care and hospice, if and he needs down the road.  He lives independently now.  Prostate cancer metastatic to bone [facility]) -initially diagnosed with Gleason 4+5 prostate cancer in 04/2019 for elevated PSA of 26.5. Staging CT AP on 05/16/19 showed two suspicious left pelvic lesions. He was started on Eligard by Dr. Diamantina Providence in urology; receiving q79month, next due ~7/26.  -Patient also receives Reclast once a year. -he did not tolerate Zytiga -Last PSA drawn  on 10/13/21 showed good response at 0.06.  -continue Eligard every 6 months, will receive per urologist Dr. MYisroel Ramming -restaging CT CAP 04/09/22 showed stable osseous metastasis at left iliac and pubic bones.      PLAN: -Discuss Ct Scan -Lab Reviewed -Discuss treatment option, pt decided to continue monitoring for now -lab,f/u in 3 mths -I spoke with his daughter on the phone today.  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 Sclerotic 1.5 cm left ischial lesion/ Sclerotic medial left iliac bone 1.5 cm lesion (series 2/image 47), increased from 1.0 cm. Gleason score of 4+5= 9; with almost all cores involved greater than 80%.  9/16 Lupron [id number] on 9/16. [Urology; Dr.Siniski]  # MID OCT 2020- Zytiga 1000 mg+ prednisone; stopped December 2021 [poor tolerance if with RVR]; DISCONTINUED.   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       [facility] bone [facility] 05/[facility] Initial Diagnosis   [facility]  [facility] [place] 10[facility]2021 Imaging   CT ABDOMEN PELVIS W CONTRAST   IMPRESSION: 1. There is new pancreatic ductal dilation with an indeterminate 19 mm hypodense low-density mass area in the head/uncinate process of the pancreas. Recommend further evaluation with dedicated MRI/MRCP with contrast. 2. No evidence of bowel obstruction.  Moderate rectal stool ball. 3. Scattered peripherally located clustered pulmonary nodules in  the RIGHT middle lobe. Findings are likely infectious or inflammatory in etiology. Given history of malignancy, recommend follow-up as per clinical protocol.   10/28/2021 Imaging   MR ABDOMEN MRCP W WO CONTAST    IMPRESSION: 1. Examination is significantly limited by breath motion artifact throughout. 2. The main pancreatic duct is diffusely dilated from the level of the superior pancreatic head, measuring up to 0.7 cm. 3. In the inferior pancreatic head and uncinate, there is a multilobulated, fluid signal cystic lesion measuring 1.8 x 1.0 cm. Due to breath motion artifact it is difficult to determine whether this communicates to the adjacent duct. There are multiple additional subcentimeter cystic lesions scattered throughout the pancreas, several of which clearly communicate to the main pancreatic duct. 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 Recommend EUS/FNA for further diagnosis given the presence of pancreatic ductal dilatation. 4. Status post left nephrectomy. 5. No evidence of recurrent or metastatic disease in the abdomen.   12/25/2021 Procedure   UPPER ENDOSCOPIC ULTRASOUND-By Dr. Rush Landmark  - A mass-like region was identified in the pancreatic head where the pancreatic duct dilates with multiple cystic regions noted throughout the pancreas as well. 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 However, the endosonographic appearance is suspicious for potential adenocarcinoma. This was staged T2 N0 Mx by endosonographic criteria. T - No malignant-appearing lymph nodes were visualized in the celiac region (level 20), peripancreatic region and porta hepatis region.   12/25/2021 Pathology Results   CYTOLOGY - NON PAP  CASE: [id number]   FINAL MICROSCOPIC DIAGNOSIS:  A. PANCREAS, HEAD, FINE NEEDLE ASPIRATION:  - Malignant cells consistent with adenocarcinoma     01/01/2022 Initial Diagnosis   Pancreatic cancer ([place])   01/13/2022 - 04/01/2022 Chemotherapy   Patient is on Treatment Plan : PANCREAS Gemcitabine D1,15 q28d x 4 Cycles     01/26/2022 Cancer Staging   Staging form: Exocrine Pancreas, AJCC 8th  Edition - Clinical: Stage IB (cT2, cN0, cM0) - Signed by Truitt Merle, MD on 01/26/2022 Total positive nodes: 0    Genetic Testing   [facility] CancerNext-Expanded Panel was Negative. Of note, a variant of uncertain significance was identified in the BLM gene (p.[id number]) and GALNT12 gene ([id number]). Report date is 01/26/2022.  The CancerNext-Expanded gene panel offered by [facility] and includes sequencing, rearrangement, and RNA analysis for the following 77 genes: AIP, ALK, APC, ATM, AXIN2, BAP1, BARD1, BLM, BMPR1A, BRCA1, BRCA2, BRIP1, CDC73, CDH1, CDK4, CDKN1B, CDKN2A, CHEK2, CTNNA1, DICER1, FANCC, FH, FLCN, GALNT12, KIF1B, LZTR1, MAX, MEN1, MET, MLH1, MSH2, MSH3, MSH6, MUTYH, NBN, NF1, NF2, NTHL1, PALB2, PHOX2B, PMS2, POT1, PRKAR1A, PTCH1, PTEN, RAD51C, RAD51D, RB1, RECQL, RET, SDHA, SDHAF2, SDHB, SDHC, SDHD, SMAD4, SMARCA4, SMARCB1, SMARCE1, STK11, SUFU, TMEM127, TP53, TSC1, TSC2, VHL and XRCC2 (sequencing and deletion/duplication); EGFR, EGLN1, HOXB13, KIT, MITF, PDGFRA, POLD1, and POLE (sequencing only); EPCAM and GREM1 (deletion/duplication only).    08/12/2022 Imaging    IMPRESSION: 1. Slight interval increase in size of the pancreatic head-uncinate process mass with increasing direct contact of the SMA and portal vein as discussed. Also with a replaced RIGHT hepatic artery which passes through the tumor. 2. No signs of acute inflammation currently about the pancreas. With similar appearance of ductal obstruction and peripheral atrophy of pancreas due to the tumor. 3. Post LEFT nephrectomy. 4. Sclerotic bony lesions of the LEFT ischium and LEFT iliac bone similar to prior imaging. 5. Mild hyperenhancement of LEFT prostate 14 mm with asymmetry, nonspecific. This is  unchanged in this patient with history of prostate neoplasm. 6. Aortic atherosclerosis.      INTERVAL HISTORY:  [name] [name] is here for a follow up of pancreatic cancer  He was last seen by me on 05/21/2022 He  presents to the clinic accompanied by daughters.Pt Reports that he has been having lower back pains, when he's mobile he hurts. He states that he is still having bloating, but appetite is good and he [place] maintaining  his weight.Pt energy level is good. Pt reports that he have some visible rash.     All other systems were reviewed with the patient and are negative.  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 ([place])    Prostate cancer metastatic to bone ([place])    REM sleep behavior disorder    Sleep apnea    BiPap    SURGICAL HISTORY: Past Surgical History:  Procedure Laterality Date   APPENDECTOMY  1963   BIOPSY  12/25/2021   Procedure: BIOPSY;  Surgeon: Irving Copas., MD;  Location[facility] ENDOSCOPY;  Service: Gastroenterology;;   COLONOSCOPY  2010, 2015   ESOPHAGOGASTRODUODENOSCOPY N/A 04/16/2022   Procedure: ESOPHAGOGASTRODUODENOSCOPY (EGD);  Surgeon: Irving Copas., MD;  Location: [place];  Service: Gastroenterology;  Laterality: N/A;   ESOPHAGOGASTRODUODENOSCOPY (EGD) WITH PROPOFOL N/A 12/25/2021   Procedure: ESOPHAGOGASTRODUODENOSCOPY (EGD) WITH PROPOFOL;  Surgeon: Rush Landmark Telford Nab., MD;  Location: [place];  Service: Gastroenterology;  Laterality: N/A;   EUS N/A 12/25/2021   Procedure: UPPER ENDOSCOPIC ULTRASOUND (EUS) RADIAL;  Surgeon: Irving Copas., MD;  Location: [place];  Service: Gastroenterology;  Laterality: N/A;   EUS N/A 04/16/2022   Procedure: UPPER ENDOSCOPIC ULTRASOUND (EUS) RADIAL;  Surgeon: Irving Copas., MD;  Location: [place];  Service: Gastroenterology;  Laterality: N/A;   FIDUCIAL MARKER PLACEMENT N/A 04/16/2022   Procedure: FIDUCIAL MARKER PLACEMENT;  Surgeon: Irving Copas., MD;  Location: [place];  Service: Gastroenterology;  Laterality: N/A;   FINE NEEDLE ASPIRATION  12/25/2021   Procedure: FINE NEEDLE ASPIRATION (FNA) LINEAR;  Surgeon: Irving Copas., MD;  Location: [facility] ENDOSCOPY;  Service: Gastroenterology;;   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   KYPHOPLASTY N/A 08/15/2020   Procedure: L2 compression fracture;  Surgeon: Hessie Knows, MD;  Location: [facility];  Service: Orthopedics;  Laterality: N/A;   KYPHOPLASTY N/A 08/29/2020   Procedure: T8 KYPHOPLASTY;  Surgeon: Hessie Knows, MD;  Location: [facility];  Service: Orthopedics;  Laterality: N/A;   KYPHOPLASTY N/A 11/12/2020   Procedure: L1 KYPHOPLASTY;  Surgeon: Hessie Knows, MD;  Location: [facility];  Service: Orthopedics;  Laterality: N/A;   PORTACATH PLACEMENT N/A 02/05/2022   Procedure: INSERTION PORT-A-CATH;  Surgeon: Dwan Bolt, MD;  Location: [facility];  Service: General;  Laterality: N/A;    I have reviewed the social history and family history with the patient and they are unchanged from previous note.  ALLERGIES:  is allergic to influenza vaccines.  MEDICATIONS:  Current Outpatient Medications  Medication Sig Dispense Refill   acetaminophen (TYLENOL) 500 MG tablet Take 1 tablet (500 mg total) by mouth every 6 (six) hours as needed for moderate pain or mild pain. 30 tablet 0   apixaban (ELIQUIS) 5 MG TABS tablet Take 1 tablet (  5 mg total) by mouth 2 (two) times daily. 120 tablet 3   carbidopa-levodopa (SINEMET CR) 50-200 MG tablet TAKE 1 TABLET AT BEDTIME 90 tablet 1   carbidopa-levodopa (SINEMET IR) 25-100 MG tablet 3 tablets at 7 AM/2 at 11 AM/2 tablets at 3 PM/2 tablet at 7 PM 810 tablet 0   clonazePAM (KLONOPIN) 0.5 MG tablet Take 0.5 mg by mouth at bedtime.     diltiazem (CARDIZEM) 30 MG tablet Take 1 tablet (30 mg total) by mouth 2 (two) times daily. 180 tablet 1   Glycerin-Hypromellose-PEG 400 (VISINE DRY EYE OP) Place 1 drop into both eyes 2 (two) times daily as needed (eye  irritation).      ketoconazole (NIZORAL) 2 % shampoo Apply 1 application  topically 3 (three) times a week.     leuprolide, 6 Month, (ELIGARD) 45 MG injection Inject 45 mg into the skin every 6 (six) months.     Levodopa (INBRIJA) 42 MG CAPS Inhale 2 capsules prn up to 5 times per day 60 capsule 0   lipase/protease/amylase (CREON) 36000 UNITS CPEP capsule Take 1 capsule (36,000 Units total) by mouth 3 (three) times daily before meals. 90 capsule 0   omeprazole (PRILOSEC) 20 MG capsule Take 1 capsule (20 mg total) by mouth daily. 90 capsule 0   ondansetron (ZOFRAN) 8 MG tablet Take 1 tablet (8 mg total) by mouth 2 (two) times daily as needed (Nausea or vomiting). (Patient taking differently: Take 8 mg by mouth 2 (two) times daily as needed for vomiting or nausea.) 30 tablet 1   prochlorperazine (COMPAZINE) 10 MG tablet Take 1 tablet (10 mg total) by mouth every 6 (six) hours as needed (Nausea or vomiting). (Patient taking differently: Take 10 mg by mouth every 6 (six) hours as needed for vomiting or nausea.) 30 tablet 1   rosuvastatin (CRESTOR) 10 MG tablet TAKE 1 TABLET DAILY 90 tablet 3   traMADol (ULTRAM) 50 MG tablet Take 1 tablet (50 mg total) by mouth every 6 (six) hours as needed. 30 tablet 0   triamcinolone cream (KENALOG) 0.1 % Apply 1 application topically 2 (two) times daily. (Patient taking differently: Apply 1 application  topically 2 (two) times daily as needed (itching).) 453.6 g 1   zoledronic acid (RECLAST) 5 MG/100ML SOLN injection Inject 5 mg into the vein See admin instructions. Once a year     No current facility-administered medications for this visit.    PHYSICAL EXAMINATION: ECOG PERFORMANCE STATUS: 2 - Symptomatic, <50% confined to bed  Vitals:   08/14/22 1451  BP: 109/74  Pulse: (!) 49  Resp: 18  Temp: 97.7 F (36.5 C)  SpO2: 98%   Wt Readings from Last 3 Encounters:  08/14/22 152 lb (68.9 kg)  06/25/22 153 lb 12.8 oz (69.8 kg)  05/21/22 153 lb 4.8 oz (69.5 kg)      GENERAL:alert, no distress and comfortable SKIN: skin color normal, no rashes or significant lesions EYES: normal, Conjunctiva are pink and non-injected, sclera clear  NEURO: alert & oriented x 3 with fluent speech  LABORATORY DATA:  I have reviewed the data as listed    Latest Ref Rng & Units 08/12/2022    3:12 PM 06/17/2022   11:04 AM 05/21/2022    1:14 PM  CBC  WBC 4.0 - 10.5 K/uL 5.6  3.4  5.4   Hemoglobin 13.0 - 17.0 g/dL 13.5  12.7  12.0   Hematocrit 39.0 - 52.0 % 41.7  39.5  37.3   Platelets  150 - 400 K/uL 208  170  175         Latest Ref Rng & Units 08/12/2022    3:12 PM 06/17/2022   11:04 AM 05/21/2022    1:14 PM  CMP  Glucose 70 - 99 mg/dL 82  94  120   BUN 8 - 23 mg/dL _0 Creatinine 0.61 - 1.24 mg/dL 1.12  1.10  1.04   Sodium 135 - 145 mmol/L 146  142  141   Potassium 3.5 - 5.1 mmol/L 4.0  4.5  4.4   Chloride 98 - 111 mmol/L 112  109  109   CO2 22 - 32 mmol/L _1 Calcium 8.9 - 10.3 mg/dL 10.0  9.5  9.8   Total Protein 6.5 - 8.1 g/dL 7.7  7.2  6.7   Total Bilirubin 0.3 - 1.2 mg/dL 0.7  0.6  0.7   Alkaline Phos 38 - 126 U/L 56  55  48   AST 15 - 41 U/L _2 ALT 0 - 44 U/L 12  5  <5       RADIOGRAPHIC STUDIES: I have personally reviewed the radiological images as listed and agreed with the findings in the report. CT ABDOMEN PELVIS W CONTRAST  Result Date: 08/14/2022 CLINICAL DATA:  Rule out recurrence of pancreatic neoplasm. * Tracking Code: BO * EXAM: CT ABDOMEN AND PELVIS WITH CONTRAST TECHNIQUE: Multidetector CT imaging of the abdomen and pelvis was performed using the standard protocol following bolus administration of intravenous contrast. RADIATION DOSE REDUCTION: This exam was performed according to the departmental dose-optimization program which includes automated exposure control, adjustment of the mA and/or kV according to patient size and/or use of iterative reconstruction technique. CONTRAST:  190m OMNIPAQUE IOHEXOL  300 MG/ML  SOLN COMPARISON:  Previous imaging April 09, 2022 FINDINGS: Lower chest: Incidental imaging of the lung bases without signs of effusion or evidence of consolidative change. No chest wall abnormality. Hepatobiliary: No focal, suspicious hepatic lesion. No pericholecystic stranding. No biliary duct dilation. Portal vein is patent. While there is no biliary duct dilation there is added density around the distal common bile duct adjacent to the pancreatic head mass (image 23/2) gallbladder is nondistended. Cyst in the superior RIGHT hemiliver is stable as is an adjacent low-density lesion. Pancreas: 2.2 x 2.7 cm mass in the head-uncinate process of the pancreas obstructing the main pancreatic duct, as measured in a similar fashion on previous imaging approximally 2.3 x 2.1 cm. Main pancreatic duct with similar degree of dilation. No signs of acute inflammation currently about the pancreas. No focal fluid collection. Narrowing which is focal of the portal vein at the level of the splenic portal confluence. Abutment of the SMA is suggested with increase in direct contact of soft tissue with the SMA compared to previous imaging (image 21/2) there is also a replaced RIGHT hepatic artery that arises from the SMA that passes through the tumor on the same image. Spleen: Normal. Adrenals/Urinary Tract: Adrenal glands are normal. Post LEFT nephrectomy. RIGHT kidney without hydronephrosis, suspicious renal mass or ureteral dilation. Urinary bladder is decompressed, grossly unremarkable. Stomach/Bowel: No stranding adjacent to the stomach. No signs of small bowel dilation or inflammation. Vascular/Lymphatic: No local adenopathy. No retroperitoneal adenopathy. Tortuous atherosclerotic abdominal aorta and iliac vessels. Reproductive: Mild hyperenhancement of LEFT prostate (image 69/2) 14 mm with asymmetry, nonspecific. This is unchanged in this patient with history of prostate  neoplasm. Other: No ascites. Musculoskeletal:  Cement augmentation of thoracic and lumbar vertebral bodies similar to prior imaging. Spinal degenerative changes. Sclerotic bony lesions of the LEFT ischium and LEFT iliac bone similar to prior imaging. IMPRESSION: 1. Slight interval increase in size of the pancreatic head-uncinate process mass with increasing direct contact of the SMA and portal vein as discussed. Also with a replaced RIGHT hepatic artery which passes through the tumor. 2. No signs of acute inflammation currently about the pancreas. With similar appearance of ductal obstruction and peripheral atrophy of pancreas due to the tumor. 3. Post LEFT nephrectomy. 4. Sclerotic bony lesions of the LEFT ischium and LEFT iliac bone similar to prior imaging. 5. Mild hyperenhancement of LEFT prostate 14 mm with asymmetry, nonspecific. This is unchanged in this patient with history of prostate neoplasm. 6. Aortic atherosclerosis. Aortic Atherosclerosis (ICD10-I70.0). Electronically Signed   By: Zetta Bills M.D.   On: 08/14/2022 09:57      No orders of the defined types were placed in this encounter.  All questions were answered. The patient knows to call the clinic with any problems, questions or concerns. No barriers to learning was detected. The total time spent in the appointment was 30 minutes.     Truitt Merle, MD 08/14/2022   Felicity Coyer, CMA, am acting as scribe for Truitt Merle, MD.   I have reviewed the above documentation for accuracy and completeness, and I agree with the above.

## 2022-08-14 ENCOUNTER — Inpatient Hospital Stay (HOSPITAL_BASED_OUTPATIENT_CLINIC_OR_DEPARTMENT_OTHER): Payer: Medicare Other | Admitting: Hematology

## 2022-08-14 ENCOUNTER — Encounter: Payer: Self-pay | Admitting: Hematology

## 2022-08-14 VITALS — BP 109/74 | HR 49 | Temp 97.7°F | Resp 18 | Ht 65.0 in | Wt 152.0 lb

## 2022-08-14 DIAGNOSIS — R21 Rash and other nonspecific skin eruption: Secondary | ICD-10-CM | POA: Diagnosis not present

## 2022-08-14 DIAGNOSIS — Z79899 Other long term (current) drug therapy: Secondary | ICD-10-CM | POA: Diagnosis not present

## 2022-08-14 DIAGNOSIS — Z887 Allergy status to serum and vaccine status: Secondary | ICD-10-CM | POA: Diagnosis not present

## 2022-08-14 DIAGNOSIS — C7951 Secondary malignant neoplasm of bone: Secondary | ICD-10-CM

## 2022-08-14 DIAGNOSIS — Z905 Acquired absence of kidney: Secondary | ICD-10-CM | POA: Diagnosis not present

## 2022-08-14 DIAGNOSIS — C61 Malignant neoplasm of prostate: Secondary | ICD-10-CM | POA: Diagnosis not present

## 2022-08-14 DIAGNOSIS — M545 Low back pain, unspecified: Secondary | ICD-10-CM | POA: Diagnosis not present

## 2022-08-14 DIAGNOSIS — N183 Chronic kidney disease, stage 3 unspecified: Secondary | ICD-10-CM | POA: Diagnosis not present

## 2022-08-14 DIAGNOSIS — Z9049 Acquired absence of other specified parts of digestive tract: Secondary | ICD-10-CM | POA: Diagnosis not present

## 2022-08-14 DIAGNOSIS — C250 Malignant neoplasm of head of pancreas: Secondary | ICD-10-CM

## 2022-08-14 DIAGNOSIS — Z7901 Long term (current) use of anticoagulants: Secondary | ICD-10-CM | POA: Diagnosis not present

## 2022-08-14 DIAGNOSIS — I700 Atherosclerosis of aorta: Secondary | ICD-10-CM | POA: Diagnosis not present

## 2022-08-14 DIAGNOSIS — G20A1 Parkinson's disease without dyskinesia, without mention of fluctuations: Secondary | ICD-10-CM | POA: Diagnosis not present

## 2022-08-14 DIAGNOSIS — R140 Abdominal distension (gaseous): Secondary | ICD-10-CM | POA: Diagnosis not present

## 2022-08-14 DIAGNOSIS — R918 Other nonspecific abnormal finding of lung field: Secondary | ICD-10-CM | POA: Diagnosis not present

## 2022-08-14 LAB — CANCER ANTIGEN 19-9: CA 19-9: 463 U/mL — ABNORMAL HIGH (ref 0–35)

## 2022-08-14 NOTE — Assessment & Plan Note (Addendum)
-  initially diagnosed with Gleason 4+5 prostate cancer in 04/2019 for elevated PSA of 26.5. Staging CT AP on 05/16/19 showed two suspicious left pelvic lesions. He was started on Eligard by Dr. Diamantina Providence in urology; receiving q18month, next due ~7/26.  -Patient also receives Reclast once a year. -he did not tolerate Zytiga -Last PSA drawn on 10/13/21 showed good response at 0.06.  -continue Eligard every 6 months, will receive per urologist Dr. MYisroel Ramming -restaging CT CAP 04/09/22 showed stable osseous metastasis at left iliac and pubic bones.

## 2022-08-14 NOTE — Assessment & Plan Note (Addendum)
-  stage IB cT2N0M0  -diagnosed in 09/2021  -Not a good candidate for surgery due to his age and medical comorbidities --he received single agent gemcitabine on 01/13/22 - 04/01/22. He tolerated well overall  -post-treatment CT CAP on 04/09/22 showed stable pancreatic head mass, no evidence of metastatic disease.  -he recently completed 5 fractions SBRT to pancreas under Dr. [name], 05/04/22 - 05/14/22. -I personally reviewed his restaging CT scan from yesterday, which showed slightly enlarged pancreatic tumor, no other new metastasis.  His tumor marker CA 19.9 has dropped significantly after radiation.  -Due to his advanced age, not symptomatic from his pancreatic cancer, I recommend continue observation.  We again reviewed the option of chemotherapy, he would like to hold chemotherapy for now.  -We also discussed palliative care and hospice, if and he needs down the road.  He lives independently now.

## 2022-08-17 ENCOUNTER — Encounter: Payer: Self-pay | Admitting: Nurse Practitioner

## 2022-08-17 ENCOUNTER — Ambulatory Visit (INDEPENDENT_AMBULATORY_CARE_PROVIDER_SITE_OTHER): Payer: Medicare Other | Admitting: Nurse Practitioner

## 2022-08-17 VITALS — BP 102/60 | HR 60 | Ht 64.0 in | Wt 155.4 lb

## 2022-08-17 DIAGNOSIS — K5900 Constipation, unspecified: Secondary | ICD-10-CM | POA: Diagnosis not present

## 2022-08-17 DIAGNOSIS — C259 Malignant neoplasm of pancreas, unspecified: Secondary | ICD-10-CM | POA: Diagnosis not present

## 2022-08-17 DIAGNOSIS — R140 Abdominal distension (gaseous): Secondary | ICD-10-CM

## 2022-08-17 NOTE — Progress Notes (Unsigned)
08/17/2022 [name] [name] [id number] 1940/10/09   Chief Complaint:  History of Present Illness: [name] [name] is an 81 year old male with a past medical history of atrial flutter, apnea Parkinson's disease, prostate cancer with bone metastasis and pancreatic cancer diagnosed 09/2021 followed by Dr. Truitt Merle.   He complains of having generalized abdominal discomfort feels like a bowling ball. He passes a BM every one or two days.  He forces a BM at times. No fecal incontinence. Stool is thin to normal. He takes Miralax and a glass of prune juice once daily. He takes a Senokot one tab as needed about once weekly. No N/V.   He complains of having abdominal swelling, increased gas  No blood with Bms. No black stools.   -Not a good candidate for surgery due to his age and medical comorbidities --he received single agent gemcitabine on 01/13/22 - 04/01/22. He tolerated well overall  -post-treatment CT CAP on 04/09/22 showed stable pancreatic head mass, no evidence of metastatic disease.  -he recently completed 5 fractions SBRT to pancreas under Dr. [name], 05/04/22 - 05/14/22.  CTAP with contrast 08/12/2022: IMPRESSION: 1. Slight interval increase in size of the pancreatic head-uncinate process mass with increasing direct contact of the SMA and portal vein as discussed. Also with a replaced RIGHT hepatic artery which passes through the tumor. 2. No signs of acute inflammation currently about the pancreas. With similar appearance of ductal obstruction and peripheral atrophy of pancreas due to the tumor. 3. Post LEFT nephrectomy. 4. Sclerotic bony lesions of the LEFT ischium and LEFT iliac bone similar to prior imaging. 5. Mild hyperenhancement of LEFT prostate 14 mm with asymmetry, nonspecific. This is unchanged in this patient with history of prostate neoplasm. 6. Aortic atherosclerosis.  Aortic Atherosclerosis (ICD10-I70.0).   Past Medical History:  Diagnosis Date   Atrial  flutter (HCC)    BPH (benign prostatic hyperplasia)    Cancer ([place])    PROSTATE   Diverticulosis 2015   Dysrhythmia    Parkinson's disease    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 [facility])    Prostate cancer metastatic to bone ([place])    REM sleep behavior disorder    Sleep apnea    BiPap   Past Surgical History:  Procedure Laterality Date   APPENDECTOMY  1963   BIOPSY  12/25/2021   Procedure: BIOPSY;  Surgeon: Irving Copas., MD;  Location[facility] ENDOSCOPY;  Service: Gastroenterology;;   COLONOSCOPY  2010, 2015   ESOPHAGOGASTRODUODENOSCOPY N/A 04/16/2022   Procedure: ESOPHAGOGASTRODUODENOSCOPY (EGD);  Surgeon: Irving Copas., MD;  Location: [place];  Service: Gastroenterology;  Laterality: N/A;   ESOPHAGOGASTRODUODENOSCOPY (EGD) WITH PROPOFOL N/A 12/25/2021   Procedure: ESOPHAGOGASTRODUODENOSCOPY (EGD) WITH PROPOFOL;  Surgeon: Rush Landmark Telford Nab., MD;  Location: [place];  Service: Gastroenterology;  Laterality: N/A;   EUS N/A 12/25/2021   Procedure: UPPER ENDOSCOPIC ULTRASOUND (EUS) RADIAL;  Surgeon: Irving Copas., MD;  Location: [place];  Service: Gastroenterology;  Laterality: N/A;   EUS N/A 04/16/2022   Procedure: UPPER ENDOSCOPIC ULTRASOUND (EUS) RADIAL;  Surgeon: Irving Copas., MD;  Location: [place];  Service: Gastroenterology;  Laterality: N/A;   FIDUCIAL MARKER PLACEMENT N/A 04/16/2022   Procedure: FIDUCIAL MARKER PLACEMENT;  Surgeon: Irving Copas., MD;  Location: [place];  Service: Gastroenterology;  Laterality: N/A;   FINE NEEDLE ASPIRATION  12/25/2021   Procedure: FINE NEEDLE ASPIRATION (FNA) LINEAR;  Surgeon: Irving Copas., MD;  Location: [place];  Service: Gastroenterology;;   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   KYPHOPLASTY N/A 08/15/2020   Procedure: L2 compression fracture;  Surgeon: Hessie Knows, MD;  Location: [facility];  Service: Orthopedics;  Laterality: N/A;    KYPHOPLASTY N/A 08/29/2020   Procedure: T8 KYPHOPLASTY;  Surgeon: Hessie Knows, MD;  Location: [facility];  Service: Orthopedics;  Laterality: N/A;   KYPHOPLASTY N/A 11/12/2020   Procedure: L1 KYPHOPLASTY;  Surgeon: Hessie Knows, MD;  Location: [facility];  Service: Orthopedics;  Laterality: N/A;   PORTACATH PLACEMENT N/A 02/05/2022   Procedure: INSERTION PORT-A-CATH;  Surgeon: Dwan Bolt, MD;  Location: [facility];  Service: General;  Laterality: N/A;       Current Medications, Allergies, Past Medical History, Past Surgical History, Family History and Social History were reviewed in [employer] record.   Review of Systems:   Constitutional: Negative for fever, sweats, chills or weight loss.  Respiratory: Negative for shortness of breath.   Cardiovascular: Negative for chest pain, palpitations and leg swelling.  Gastrointestinal: See HPI.  Musculoskeletal: Negative for back pain or muscle aches.  Neurological: Negative for dizziness, headaches or paresthesias.    Physical Exam: BP 102/60   Pulse 60   Ht '5\' 4"'$  (1.626 m)   Wt 155 lb 6.4 oz (70.5 kg)   BMI 26.67 kg/m  General: Well developed, w   ***male in no acute distress. Head: Normocephalic and atraumatic. Eyes: No scleral icterus. Conjunctiva pink . Ears: Normal auditory acuity. Mouth: Dentition intact. No ulcers or lesions.  Lungs: Clear throughout to auscultation. Heart: Regular rate and rhythm, no murmur. Abdomen: Soft, nontender and nondistended. No masses or hepatomegaly. Normal bowel sounds x 4 quadrants.  Rectal: *** Musculoskeletal: Symmetrical with no gross deformities. Extremities: No edema. Neurological: Alert oriented x 4. No focal deficits.  Psychological: Alert and cooperative. Normal mood and affect  Assessment and Recommendations: ***

## 2022-08-17 NOTE — Patient Instructions (Addendum)
Ok to increase Miralax twice daily to avoid straining and to increase stool output  Gas X one tab twice daily, purchased over the counter   Digestive enzyme 1 to 2 tab twice daily as tolerated, purchased over the counter   Continue follow up with your oncologist   Contact our office if your constipation, abdominal bloat symptoms persist or worsen.  Thank you for trusting me with your gastrointestinal care!   Carl Best, CRNP

## 2022-08-19 ENCOUNTER — Telehealth: Payer: Self-pay

## 2022-08-19 DIAGNOSIS — R140 Abdominal distension (gaseous): Secondary | ICD-10-CM

## 2022-08-19 DIAGNOSIS — K5900 Constipation, unspecified: Secondary | ICD-10-CM

## 2022-08-19 NOTE — Progress Notes (Signed)
Attending Physician's Attestation   I have reviewed the chart.   I agree with the Advanced Practitioner's note, impression, and recommendations with any updates as below. Agree with trying to be aggressive with his bowel regimen to keep his bowels moving.  Gas-X trial on a scheduled basis makes sense for period of time.  Consider checking fecal elastase and if low pancreas enzyme replacement therapy could be considered.  SIBO breath testing versus empiric Xifaxan therapy could also be considered.  Based on how things are being described, I am not sure that a celiac block/neurolysis is what the patient needs if gas/bloating is the biggest symptom.   Justice Britain, MD [facility] Office # [phone]

## 2022-08-19 NOTE — Telephone Encounter (Signed)
Pt notified of Carl Best NP recommendations:  Order for the lab entered:  Location to lab given:  Pt verbalized understanding with all questions answered.

## 2022-08-19 NOTE — Progress Notes (Signed)
[name] [name], pls contact patient and send him to the lab to check a pancreatic elastase level. THX

## 2022-08-19 NOTE — Telephone Encounter (Signed)
Message Received: Today Noralyn Pick, NP  Gillermina Hu, RN      Previous Messages  Routed Note  Author: Noralyn Pick, NP Service: Gastroenterology Author Type: Nurse Practitioner  Filed: 08/19/2022  6:03 AM Encounter Date: 08/17/2022 Status: Signed  Editor: Noralyn Pick, NP (Nurse Practitioner)   Remo Lipps, pls contact patient and send him to the lab to check a pancreatic elastase level. THX

## 2022-08-24 ENCOUNTER — Other Ambulatory Visit: Payer: Medicare Other

## 2022-08-25 ENCOUNTER — Other Ambulatory Visit: Payer: Medicare Other

## 2022-08-25 DIAGNOSIS — K5900 Constipation, unspecified: Secondary | ICD-10-CM

## 2022-08-25 DIAGNOSIS — L821 Other seborrheic keratosis: Secondary | ICD-10-CM | POA: Diagnosis not present

## 2022-08-25 DIAGNOSIS — R140 Abdominal distension (gaseous): Secondary | ICD-10-CM

## 2022-08-25 DIAGNOSIS — C4491 Basal cell carcinoma of skin, unspecified: Secondary | ICD-10-CM | POA: Diagnosis not present

## 2022-08-25 DIAGNOSIS — L853 Xerosis cutis: Secondary | ICD-10-CM | POA: Diagnosis not present

## 2022-08-25 DIAGNOSIS — D229 Melanocytic nevi, unspecified: Secondary | ICD-10-CM | POA: Diagnosis not present

## 2022-08-25 DIAGNOSIS — L570 Actinic keratosis: Secondary | ICD-10-CM | POA: Diagnosis not present

## 2022-08-27 DIAGNOSIS — D649 Anemia, unspecified: Secondary | ICD-10-CM | POA: Diagnosis not present

## 2022-09-03 LAB — PANCREATIC ELASTASE, FECAL: Pancreatic Elastase-1, Stool: 212 mcg/g

## 2022-09-04 ENCOUNTER — Other Ambulatory Visit: Payer: Self-pay | Admitting: Neurology

## 2022-09-04 DIAGNOSIS — G20A1 Parkinson's disease without dyskinesia, without mention of fluctuations: Secondary | ICD-10-CM

## 2022-09-04 DIAGNOSIS — L603 Nail dystrophy: Secondary | ICD-10-CM | POA: Diagnosis not present

## 2022-09-04 DIAGNOSIS — M792 Neuralgia and neuritis, unspecified: Secondary | ICD-10-CM | POA: Diagnosis not present

## 2022-09-04 DIAGNOSIS — I739 Peripheral vascular disease, unspecified: Secondary | ICD-10-CM | POA: Diagnosis not present

## 2022-09-04 DIAGNOSIS — B351 Tinea unguium: Secondary | ICD-10-CM | POA: Diagnosis not present

## 2022-09-04 DIAGNOSIS — L851 Acquired keratosis [keratoderma] palmaris et plantaris: Secondary | ICD-10-CM | POA: Diagnosis not present

## 2022-09-09 ENCOUNTER — Other Ambulatory Visit: Payer: Self-pay | Admitting: Hematology

## 2022-09-24 ENCOUNTER — Other Ambulatory Visit: Payer: Self-pay | Admitting: Nurse Practitioner

## 2022-09-24 MED ORDER — ZENPEP 15000-47000 UNITS PO CPEP: ORAL_CAPSULE | ORAL | 1 refills | Status: DC

## 2022-09-28 DIAGNOSIS — H00011 Hordeolum externum right upper eyelid: Secondary | ICD-10-CM | POA: Diagnosis not present

## 2022-09-29 ENCOUNTER — Encounter: Payer: Self-pay | Admitting: Internal Medicine

## 2022-09-29 ENCOUNTER — Other Ambulatory Visit (HOSPITAL_COMMUNITY): Payer: Self-pay

## 2022-09-30 ENCOUNTER — Encounter: Payer: Self-pay | Admitting: Hematology

## 2022-10-02 ENCOUNTER — Other Ambulatory Visit: Payer: Self-pay | Admitting: Hematology

## 2022-10-02 MED ORDER — OXYCODONE HCL 5 MG PO TABS: 2.5000 mg | ORAL_TABLET | Freq: Two times a day (BID) | ORAL | 0 refills | Status: DC | PRN

## 2022-10-05 ENCOUNTER — Telehealth: Payer: Self-pay | Admitting: Nurse Practitioner

## 2022-10-05 NOTE — Telephone Encounter (Signed)
Patient called, stated pharmacy [facility] through [facility]. Patient spoke with [facility], was advised he needed a Prior Authorization prior to getting a refill. Please advise.

## 2022-10-06 NOTE — Telephone Encounter (Signed)
Patient called to follow up on Prior Authorization. Please advise.

## 2022-10-09 NOTE — Telephone Encounter (Signed)
Patient called to follow up on medication status states he has been waiting all week for someone to call him and he is now tired of waiting. Can anyone please follow up with this patient sometime today. Thanks

## 2022-10-12 ENCOUNTER — Encounter: Payer: Self-pay | Admitting: Neurology

## 2022-10-12 ENCOUNTER — Telehealth: Payer: Self-pay

## 2022-10-12 ENCOUNTER — Telehealth: Payer: Self-pay | Admitting: Pharmacy Technician

## 2022-10-12 ENCOUNTER — Other Ambulatory Visit (HOSPITAL_COMMUNITY): Payer: Self-pay

## 2022-10-12 NOTE — Telephone Encounter (Signed)
Called patient and have him an update on prior authorization. [facility] prior Josem Kaufmann has been sent and is waiting on approval. Patient stated understanding and had no questions at the end of call.

## 2022-10-12 NOTE — Telephone Encounter (Signed)
Patient Advocate Encounter  Received notification from [employer] that prior authorization for [facility] is required.   PA submitted on 2.5.24 Key [id number] Status is pending

## 2022-10-12 NOTE — Telephone Encounter (Signed)
Pt called stating that he's starting to have increasing pain mostly around his lower rib cage.  Pt stated that the right side hurts more that the left.  Pt described pain as constant dull pain that hurts when he's active and non-active.  Pt stated he's been taking pain medicine for the pain but it doesn't seem to be helping much anymore.  Pt would like to come in tomorrow or sometime this week to be seen by Dr. Burr Medico or Regan Rakers.  Notified Dr. Burr Medico and Regan Rakers of the pt's call.

## 2022-10-12 NOTE — Telephone Encounter (Signed)
Called patients daughter to make her aware of his appointment for 10/13/2022 at 1030 for labs and 1100 for Cira Rue NP. Daughter stated she would have him here tomorrow for the appointment.

## 2022-10-13 ENCOUNTER — Telehealth: Payer: Self-pay

## 2022-10-13 ENCOUNTER — Inpatient Hospital Stay: Payer: Medicare Other | Attending: Internal Medicine | Admitting: Nurse Practitioner

## 2022-10-13 ENCOUNTER — Encounter: Payer: Self-pay | Admitting: Nurse Practitioner

## 2022-10-13 ENCOUNTER — Inpatient Hospital Stay: Payer: Medicare Other

## 2022-10-13 ENCOUNTER — Other Ambulatory Visit: Payer: Self-pay

## 2022-10-13 VITALS — BP 117/65 | HR 54 | Temp 98.0°F | Resp 18 | Ht 64.0 in | Wt 151.3 lb

## 2022-10-13 DIAGNOSIS — Z7901 Long term (current) use of anticoagulants: Secondary | ICD-10-CM | POA: Diagnosis not present

## 2022-10-13 DIAGNOSIS — N183 Chronic kidney disease, stage 3 unspecified: Secondary | ICD-10-CM | POA: Insufficient documentation

## 2022-10-13 DIAGNOSIS — I700 Atherosclerosis of aorta: Secondary | ICD-10-CM | POA: Diagnosis not present

## 2022-10-13 DIAGNOSIS — C7951 Secondary malignant neoplasm of bone: Secondary | ICD-10-CM | POA: Diagnosis not present

## 2022-10-13 DIAGNOSIS — G20A1 Parkinson's disease without dyskinesia, without mention of fluctuations: Secondary | ICD-10-CM | POA: Insufficient documentation

## 2022-10-13 DIAGNOSIS — Z905 Acquired absence of kidney: Secondary | ICD-10-CM | POA: Insufficient documentation

## 2022-10-13 DIAGNOSIS — Z9049 Acquired absence of other specified parts of digestive tract: Secondary | ICD-10-CM | POA: Diagnosis not present

## 2022-10-13 DIAGNOSIS — C250 Malignant neoplasm of head of pancreas: Secondary | ICD-10-CM | POA: Insufficient documentation

## 2022-10-13 DIAGNOSIS — Z9221 Personal history of antineoplastic chemotherapy: Secondary | ICD-10-CM | POA: Insufficient documentation

## 2022-10-13 DIAGNOSIS — Z923 Personal history of irradiation: Secondary | ICD-10-CM | POA: Diagnosis not present

## 2022-10-13 DIAGNOSIS — K5900 Constipation, unspecified: Secondary | ICD-10-CM | POA: Insufficient documentation

## 2022-10-13 DIAGNOSIS — C259 Malignant neoplasm of pancreas, unspecified: Secondary | ICD-10-CM

## 2022-10-13 DIAGNOSIS — Z79899 Other long term (current) drug therapy: Secondary | ICD-10-CM | POA: Diagnosis not present

## 2022-10-13 DIAGNOSIS — C61 Malignant neoplasm of prostate: Secondary | ICD-10-CM

## 2022-10-13 LAB — CBC WITH DIFFERENTIAL (CANCER CENTER ONLY)
Abs Immature Granulocytes: 0.02 10*3/uL (ref 0.00–0.07)
Basophils Absolute: 0 10*3/uL (ref 0.0–0.1)
Basophils Relative: 1 %
Eosinophils Absolute: 0.2 10*3/uL (ref 0.0–0.5)
Eosinophils Relative: 4 %
HCT: 41 % (ref 39.0–52.0)
Hemoglobin: 13.3 g/dL (ref 13.0–17.0)
Immature Granulocytes: 0 %
Lymphocytes Relative: 25 %
Lymphs Abs: 1.1 10*3/uL (ref 0.7–4.0)
MCH: 30.2 pg (ref 26.0–34.0)
MCHC: 32.4 g/dL (ref 30.0–36.0)
MCV: 93.2 fL (ref 80.0–100.0)
Monocytes Absolute: 0.5 10*3/uL (ref 0.1–1.0)
Monocytes Relative: 11 %
Neutro Abs: 2.7 10*3/uL (ref 1.7–7.7)
Neutrophils Relative %: 59 %
Platelet Count: 193 10*3/uL (ref 150–400)
RBC: 4.4 MIL/uL (ref 4.22–5.81)
RDW: 14 % (ref 11.5–15.5)
WBC Count: 4.6 10*3/uL (ref 4.0–10.5)
nRBC: 0 % (ref 0.0–0.2)

## 2022-10-13 LAB — CMP (CANCER CENTER ONLY)
ALT: 10 U/L (ref 0–44)
AST: 25 U/L (ref 15–41)
Albumin: 4.2 g/dL (ref 3.5–5.0)
Alkaline Phosphatase: 57 U/L (ref 38–126)
Anion gap: 5 (ref 5–15)
BUN: 15 mg/dL (ref 8–23)
CO2: 29 mmol/L (ref 22–32)
Calcium: 10.1 mg/dL (ref 8.9–10.3)
Chloride: 111 mmol/L (ref 98–111)
Creatinine: 1.09 mg/dL (ref 0.61–1.24)
GFR, Estimated: >60 mL/min (ref >60)
Glucose, Bld: 93 mg/dL (ref 70–99)
Potassium: 4.5 mmol/L (ref 3.5–5.1)
Sodium: 145 mmol/L (ref 135–145)
Total Bilirubin: 1.2 mg/dL (ref 0.3–1.2)
Total Protein: 6.8 g/dL (ref 6.5–8.1)

## 2022-10-13 NOTE — Progress Notes (Signed)
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 Sclerotic 1.5 cm left ischial lesion/ Sclerotic medial left iliac bone 1.5 cm lesion (series 2/image 47), increased from 1.0 cm. Gleason score of 4+5= 9; with almost all cores involved greater than 80%.  9/16 Lupron 59-monthdepot on 9/16. [Urology; Dr.Siniski]  # MID OCT 2020- Zytiga 1000 mg+ prednisone; stopped December 2021 [poor tolerance if with RVR]; DISCONTINUED.   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       Prostate cancer metastatic to bone ([facility]  05/24/2019 Initial Diagnosis   Prostate cancer metastatic to bone ([facility]   [place]  10/02/2021 Imaging   CT ABDOMEN PELVIS W CONTRAST   IMPRESSION: 1. There is new pancreatic ductal dilation with an indeterminate 19 mm hypodense low-density mass area in the head/uncinate process of the pancreas. Recommend further evaluation with dedicated MRI/MRCP with contrast. 2. No evidence of bowel obstruction.  Moderate rectal stool ball. 3. Scattered peripherally located clustered pulmonary nodules in the RIGHT middle lobe. Findings are likely infectious or inflammatory in etiology. Given history of malignancy, recommend follow-up as per clinical protocol.    10/28/2021 Imaging   MR ABDOMEN MRCP W WO CONTAST   IMPRESSION: 1. Examination is significantly limited by breath motion artifact throughout. 2. The main pancreatic duct is diffusely dilated from the level of the superior pancreatic head, measuring up to 0.7 cm. 3. In the inferior pancreatic head and uncinate, there is a multilobulated, fluid signal cystic lesion measuring 1.8 x 1.0 cm. Due to breath motion artifact it is difficult to determine whether this communicates to the adjacent duct. There are multiple additional subcentimeter cystic lesions scattered throughout the pancreas, several of which clearly communicate to the main pancreatic duct. 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 Recommend EUS/FNA for further diagnosis given the presence of pancreatic ductal dilatation. 4. Status post left nephrectomy. 5. No evidence of recurrent or metastatic disease in the abdomen.   12/25/2021 Procedure   UPPER ENDOSCOPIC ULTRASOUND-By Dr. MRush Landmark - A mass-like region was identified in the pancreatic head where the pancreatic duct dilates with multiple cystic regions noted throughout the pancreas as well. 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 However, the endosonographic appearance is suspicious for potential adenocarcinoma. This was staged T2 N0 Mx by endosonographic criteria. T - No malignant-appearing lymph nodes were visualized in the celiac region (level 20), peripancreatic region and porta hepatis region.   12/25/2021 Pathology Results   CYTOLOGY - NON PAP  CASE: [id number]   FINAL MICROSCOPIC DIAGNOSIS:  A. PANCREAS, HEAD, FINE NEEDLE ASPIRATION:  - Malignant cells consistent with adenocarcinoma     01/01/2022 Initial Diagnosis   [place]ancreatic cancer ([facility]   01/13/2022 - 04/01/2022 Chemotherapy   Patient is on Treatment Plan : PANCREAS Gemcitabine D1,15 q28d x 4 Cycles  01/26/2022 Cancer Staging    Staging form: Exocrine Pancreas, AJCC 8th Edition - Clinical: Stage IB (cT2, cN0, cM0) - Signed by Truitt Merle, MD on 01/26/2022 Total positive nodes: 0    Genetic Testing   [facility] CancerNext-Expanded Panel was Negative. Of note, a variant of uncertain significance was identified in the BLM gene (p.[id number]) and GALNT12 gene ([id number]). Report date is 01/26/2022.  The CancerNext-Expanded gene panel offered by [facility] and includes sequencing, rearrangement, and RNA analysis for the following 77 genes: AIP, ALK, APC, ATM, AXIN2, BAP1, BARD1, BLM, BMPR1A, BRCA1, BRCA2, BRIP1, CDC73, CDH1, CDK4, CDKN1B, CDKN2A, CHEK2, CTNNA1, DICER1, FANCC, FH, FLCN, GALNT12, KIF1B, LZTR1, MAX, MEN1, MET, MLH1, MSH2, MSH3, MSH6, MUTYH, NBN, NF1, NF2, NTHL1, PALB2, PHOX2B, PMS2, POT1, PRKAR1A, PTCH1, PTEN, RAD51C, RAD51D, RB1, RECQL, RET, SDHA, SDHAF2, SDHB, SDHC, SDHD, SMAD4, SMARCA4, SMARCB1, SMARCE1, STK11, SUFU, TMEM127, TP53, TSC1, TSC2, VHL and XRCC2 (sequencing and deletion/duplication); EGFR, EGLN1, HOXB13, KIT, MITF, PDGFRA, POLD1, and POLE (sequencing only); EPCAM and GREM1 (deletion/duplication only).    08/12/2022 Imaging    IMPRESSION: 1. Slight interval increase in size of the pancreatic head-uncinate process mass with increasing direct contact of the SMA and portal vein as discussed. Also with a replaced RIGHT hepatic artery which passes through the tumor. 2. No signs of acute inflammation currently about the pancreas. With similar appearance of ductal obstruction and peripheral atrophy of pancreas due to the tumor. 3. Post LEFT nephrectomy. 4. Sclerotic bony lesions of the LEFT ischium and LEFT iliac bone similar to prior imaging. 5. Mild hyperenhancement of LEFT prostate 14 mm with asymmetry, nonspecific. This is unchanged in this patient with history of prostate neoplasm. 6. Aortic atherosclerosis.      CURRENT THERAPY:  S/p chemo and radiation currently on surveillance for  pancreatic cancer  Androgen deprivation for metastatic prostate cancer  INTERVAL HISTORY Ambroise presents for symptom management visit, here with his daughter [name], for worsening pain at the low rib cage and mid back. Pain is dull, aching, 7/10 usually. He also has constipation with abdominal fullness and gas which he thinks is masking the other pain. He has had this pain for a long time, but worse in past few weeks/month. He takes 1/2 oxycodone tab and tylenol 2 out of every 3 days. Meds help pain reduce to 2/10. For constipation, he is taking miralax, senakot, and glass of prune juice daily, with gas X PRN. He had BM this morning but feels incomplete. He doesn't feel like eating much but still consumes smoothies and other healthy options. He has lost a few lbs. Energy level has declined overall, doesn't feel like doing much even if pain is not bad. Denies n/v, juandice, fever, chills, cough, chest pain, dyspnea, leg edema.   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 [facility])    Prostate cancer metastatic to bone ([place])    REM sleep behavior disorder    Sleep apnea    BiPap     Past Surgical History:  Procedure Laterality Date   APPENDECTOMY  1963   BIOPSY  12/25/2021   Procedure: BIOPSY;  Surgeon: Irving Copas., MD;  Location[facility] ENDOSCOPY;  Service: Gastroenterology;;   COLONOSCOPY  2010, 2015   ESOPHAGOGASTRODUODENOSCOPY N/A 04/16/2022   Procedure: ESOPHAGOGASTRODUODENOSCOPY (EGD);  Surgeon: Irving Copas., MD;  Location: [place];  Service: Gastroenterology;  Laterality: N/A;   ESOPHAGOGASTRODUODENOSCOPY (EGD) WITH PROPOFOL N/A 12/25/2021   Procedure: ESOPHAGOGASTRODUODENOSCOPY (EGD) WITH PROPOFOL;   Surgeon: Rush Landmark Telford Nab., MD;  Location: [place];  Service: Gastroenterology;  Laterality: N/A;   EUS N/A 12/25/2021   Procedure: UPPER ENDOSCOPIC ULTRASOUND (EUS) RADIAL;  Surgeon: Irving Copas., MD;  Location: [place];  Service: Gastroenterology;  Laterality: N/A;   EUS N/A 04/16/2022   Procedure: UPPER ENDOSCOPIC ULTRASOUND (EUS) RADIAL;  Surgeon: Irving Copas., MD;  Location: [place];  Service: Gastroenterology;  Laterality: N/A;   FIDUCIAL MARKER PLACEMENT N/A 04/16/2022   Procedure: FIDUCIAL MARKER PLACEMENT;  Surgeon: Irving Copas., MD;  Location: [place];  Service: Gastroenterology;  Laterality: N/A;   FINE NEEDLE ASPIRATION  12/25/2021   Procedure: FINE NEEDLE ASPIRATION (FNA) LINEAR;  Surgeon: Irving Copas., MD;  Location: [facility] ENDOSCOPY;  Service: Gastroenterology;;   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   KYPHOPLASTY N/A 08/15/2020   Procedure: L2 compression fracture;  Surgeon: Hessie Knows, MD;  Location: [facility] ORS;  Service: Orthopedics;  Laterality: N/A;   KYPHOPLASTY N/A 08/29/2020   Procedure: T8 KYPHOPLASTY;  Surgeon: Hessie Knows, MD;  Location: [facility] ORS;  Service: Orthopedics;  Laterality: N/A;   KYPHOPLASTY N/A 11/12/2020   Procedure: L1 KYPHOPLASTY;  Surgeon: Hessie Knows, MD;  Location: [facility] ORS;  Service: Orthopedics;  Laterality: N/A;   PORTACATH PLACEMENT N/A 02/05/2022   Procedure: INSERTION PORT-A-CATH;  Surgeon: Dwan Bolt, MD;  Location: [facility];  Service: General;  Laterality: N/A;     Outpatient Encounter Medications as of 10/13/2022  Medication Sig Note   acetaminophen (TYLENOL) 500 MG tablet Take 1 tablet (500 mg total) by mouth every 6 (six) hours as needed for moderate pain or mild pain.    apixaban (ELIQUIS) 5 MG TABS tablet Take 1 tablet (5 mg total) by mouth 2 (two) times daily.    carbidopa-levodopa (SINEMET CR) 50-200 MG tablet TAKE 1 TABLET AT BEDTIME    carbidopa-levodopa (SINEMET IR) 25-100 MG  tablet 3 tablets at 7 AM/2 at 11 AM/2 tablets at 3 PM/2 tablet at 7 PM    clonazePAM (KLONOPIN) 0.5 MG tablet Take 0.5 mg by mouth at bedtime.    Glycerin-Hypromellose-PEG 400 (VISINE DRY EYE OP) Place 1 drop into both eyes 2 (two) times daily as needed (eye irritation).     ketoconazole (NIZORAL) 2 % shampoo Apply 1 application  topically 3 (three) times a week.    leuprolide, 6 Month, (ELIGARD) 45 MG injection Inject 45 mg into the skin every 6 (six) months.    Levodopa (INBRIJA) 42 MG CAPS Inhale 2 capsules prn up to 5 times per day    omeprazole (PRILOSEC) 20 MG capsule TAKE 1 CAPSULE DAILY    ondansetron (ZOFRAN) 8 MG tablet Take 1 tablet (8 mg total) by mouth 2 (two) times daily as needed (Nausea or vomiting). (Patient taking differently: Take 8 mg by mouth 2 (two) times daily as needed for vomiting or nausea.) 02/05/2022: Has medication, has not taken   oxyCODONE (OXY IR/ROXICODONE) 5 MG immediate release tablet Take 0.5-1 tablets (2.5-5 mg total) by mouth every 12 (twelve) hours as needed for severe pain.    prochlorperazine (COMPAZINE) 10 MG tablet Take 1 tablet (10 mg total) by mouth every 6 (six) hours as needed (Nausea or vomiting). (Patient taking differently: Take 10 mg  by mouth every 6 (six) hours as needed for vomiting or nausea.) 02/05/2022: Has medication, has not taken   rosuvastatin (CRESTOR) 10 MG tablet TAKE 1 TABLET DAILY    triamcinolone cream (KENALOG) 0.1 % Apply 1 application topically 2 (two) times daily. (Patient taking differently: Apply 1 application  topically 2 (two) times daily as needed (itching).)    zoledronic acid (RECLAST) 5 MG/100ML SOLN injection Inject 5 mg into the vein See admin instructions. Once a year    diltiazem (CARDIZEM) 30 MG tablet Take 1 tablet (30 mg total) by mouth 2 (two) times daily.    Pancrelipase, Lip-Prot-Amyl, (ZENPEP) 41937-90240 units CPEP Take one capsule by mouth tid before meals (Patient not taking: Reported on 10/13/2022)    No  facility-administered encounter medications on file as of 10/13/2022.     Today's Vitals   10/13/22 1102 10/13/22 1104  BP: 117/65   Pulse: (!) 54   Resp: 18   Temp: 98 F (36.7 C)   TempSrc: Oral   SpO2: 100%   Weight: 151 lb 4.8 oz (68.6 kg)   Height: '5\' 4"'$  (1.626 m)   PainSc:  2    Body mass index is 25.97 kg/m.   PHYSICAL EXAM GENERAL: alert, frail, in no distress SKIN: no rash  EYES: sclera clear LUNGS: clear with normal breathing effort HEART: regular rate & rhythm, no lower extremity edema ABDOMEN: abdomen soft, normal bowel sounds. Scattered ttp throughout the abdomen and pelvis NEURO: alert & oriented x 3 with fluent speech, right leg tremor MSK: TTP at the thoracolumbar region [facility] without erythema    CBC    Component Value Date/Time   WBC 4.6 10/13/2022 1032   WBC 5.1 04/14/2022 1237   RBC 4.40 10/13/2022 1032   HGB 13.3 10/13/2022 1032   HGB 13.0 12/04/2021 1359   HCT 41.0 10/13/2022 1032   HCT 37.7 12/04/2021 1359   PLT 193 10/13/2022 1032   PLT 317 01/11/2019 1025   MCV 93.2 10/13/2022 1032   MCV 92 01/11/2019 1025   MCH 30.2 10/13/2022 1032   MCHC 32.4 10/13/2022 1032   RDW 14.0 10/13/2022 1032   RDW 12.7 01/11/2019 1025   LYMPHSABS 1.1 10/13/2022 1032   LYMPHSABS 1.7 01/11/2019 1025   MONOABS 0.5 10/13/2022 1032   EOSABS 0.2 10/13/2022 1032   EOSABS 0.1 01/11/2019 1025   BASOSABS 0.0 10/13/2022 1032   BASOSABS 0.1 01/11/2019 1025     CMP     Component Value Date/Time   NA 145 10/13/2022 1032   NA 139 12/04/2021 1359   K 4.5 10/13/2022 1032   CL 111 10/13/2022 1032   CO2 29 10/13/2022 1032   GLUCOSE 93 10/13/2022 1032   BUN 15 10/13/2022 1032   BUN 20 12/04/2021 1359   CREATININE 1.09 10/13/2022 1032   CALCIUM 10.1 10/13/2022 1032   PROT 6.8 10/13/2022 1032   PROT 6.8 12/04/2021 1359   ALBUMIN 4.2 10/13/2022 1032   ALBUMIN 4.6 12/04/2021 1359   AST 25 10/13/2022 1032   ALT 10 10/13/2022 1032   ALKPHOS 57 10/13/2022 1032    BILITOT 1.2 10/13/2022 1032   GFRNONAA >60 10/13/2022 1032   GFRAA 47 (L) 05/06/2020 0935     ASSESSMENT & PLAN:Franklin R Kazmierczak is a 82 y.o. male with    Pancreatic cancer, stage IB cT2N0M0  -diagnosed in 09/2021, deemed poor surgical candidate due to his age and medical co-morbidities -S/p single agent gemcitabine 01/13/22 - 04/01/22 and 5 fx SBRT [name]) 05/04/22 -  05/14/22. CA 19-9 decreased after treatment -Surveillance CT 08/12/22 showed slightly enlarged pancreatic head mass, no evidence of metastatic disease -Mr. Arora appears frail but stable. He has worsening mid back and ribcage pain, pain he's had since cancer diagnosis, and constipation/gas discomfort. He has lost a few lbs with declining performance status. -TTP on exam in the thoracolumbar region and scattered about the abdomen.  -Pain not adequately managed on 2.5 mg oxy + tylenol PRN, I recommend to increase to 5 mg, continue tylenol, and increase bowel regimen to maintain normal BM.  -I recommend palliative care consult to help with pain management, pt agrees. Will coordinate to see Lexine Baton with next med onc visit, hopefully  -He saw GI in 12/23 who prescribed pancreatic enzyme, has not started due to high copay ($600)/insurance issues. Dr. Rush Landmark had other thoughts if pain worsened, if CT shows no progression, will refer him back to GI -Will obtain KUB today and proceed with CT AP to evaluate.  -F/up after imaging to review results/next steps   2. Prostate cancer metastatic to bone [facility]) -initially diagnosed with Gleason 4+5 prostate cancer in 04/2019 for elevated PSA of 26.5. Staging CT AP on 05/16/19 showed two suspicious left pelvic lesions. He was started on Eligard q30month -Patient also receives Reclast once a year. -he did not tolerate Zytiga -Last PSA drawn on 04/14/22 showed good response at <0.01  -continue Eligard every 6 months, will receive per urologist Dr. MTresa Moore -restaging CT CAP 08/12/22 showed stable osseous  metastasis at left iliac and pubic bones.   PLAN: -CBC/CMP normal, CA 19-9 is pending -KUB today, if not too long to wait in radiology  -CT AP in the next week to evaluate abdominal and back pain -F/up after with results -Reviewed pain and constipation symptom management - take 1/2 tab oxy + tylenol for mild pain, take 1 tab oxy + tylenol for severe pain, escalate bowel regimen as needed -Referral to palliative care for pain management    Orders Placed This Encounter  Procedures   DG Abd 1 View    Standing Status:   Future    Standing Expiration Date:   10/13/2023    Order Specific Question:   Reason for Exam (SYMPTOM  OR DIAGNOSIS REQUIRED)    Answer:   h/o pancreatic cancer, worsening abdominal pain/gas    Order Specific Question:   [facility]    [facility]  [facility] Abdom[facility] Pel[facility] W Contrast    Standing Status:   Future    Standing Expiration Date:   10/13/2023    Order Specific Question:   If indicated for the ordered procedure, I authorize the administration of contrast media per Radiology protocol    Answer:   Yes    Order Specific Question:   Does the patient have a contrast media/X-ray dye allergy?    Answer:   No    Order Specific Question:   [facility]    [facility]   Order Specific Question:   Is Oral Contrast requested for this exam?    Answer:   Yes, Per Radiology protocol      All questions were answered. The patient knows to call the clinic with any problems, questions or concerns. No barriers to learning were detected. I spent 20 minutes counseling the patient face to face. The total time spent in the appointment was 30 minutes and more than 50% was on counseling, review of test  results, and coordination of care.   Cira Rue, NP-C 10/13/2022

## 2022-10-13 NOTE — Telephone Encounter (Addendum)
Called patients daughter to let her know that the CT is approved. Gave her the number to centralized scheduling to set the appointment. Will set up appointment with Dr. Burr Medico or Regan Rakers a couple days after.    ----- Message from Alla Feeling, NP sent at 10/13/2022  1:26 PM EST ----- I have placed an order for CT AP to be done within a week, please help to get PA and schedule. Once it's scheduled, please make f/up with me or YF 1-2 days after.   Thanks, Regan Rakers

## 2022-10-15 ENCOUNTER — Other Ambulatory Visit: Payer: Self-pay

## 2022-10-15 DIAGNOSIS — G20A1 Parkinson's disease without dyskinesia, without mention of fluctuations: Secondary | ICD-10-CM

## 2022-10-15 MED ORDER — CARBIDOPA-LEVODOPA 25-100 MG PO TABS: ORAL_TABLET | ORAL | 0 refills | Status: DC

## 2022-10-16 LAB — CANCER ANTIGEN 19-9: CA 19-9: 293 U/mL — ABNORMAL HIGH (ref 0–35)

## 2022-10-16 NOTE — Telephone Encounter (Signed)
Received a fax regarding Prior Authorization from [employer] for [place]. Authorization has been DENIED because   Phone#[phone]

## 2022-10-20 ENCOUNTER — Telehealth: Payer: Self-pay | Admitting: Nurse Practitioner

## 2022-10-20 NOTE — Telephone Encounter (Signed)
Inbound call from patient daughter requesting to speak with a nurse , ZENPEP medication was denied and want to know if there any generic for zenpep .Please advise

## 2022-10-20 NOTE — Telephone Encounter (Signed)
Spoke with Pt daughter [name] [name] requesting a generic for the Zenpep or an alternative. Chart reviewed. PA was denied for the Zenpep Please advise

## 2022-10-25 ENCOUNTER — Emergency Department (HOSPITAL_BASED_OUTPATIENT_CLINIC_OR_DEPARTMENT_OTHER)
Admission: EM | Admit: 2022-10-25 | Discharge: 2022-10-25 | Disposition: A | Discharge status: Home / Self Care | Payer: Medicare Other | Attending: Emergency Medicine | Admitting: Emergency Medicine

## 2022-10-25 DIAGNOSIS — I480 Paroxysmal atrial fibrillation: Secondary | ICD-10-CM

## 2022-10-25 DIAGNOSIS — R000 Tachycardia, unspecified: Secondary | ICD-10-CM | POA: Diagnosis not present

## 2022-10-25 LAB — BASIC METABOLIC PANEL
Anion gap: 8 (ref 5–15)
BUN: 21 mg/dL (ref 8–23)
CO2: 25 mmol/L (ref 22–32)
Calcium: 9.9 mg/dL (ref 8.9–10.3)
Chloride: 107 mmol/L (ref 98–111)
Creatinine, Ser: 1.25 mg/dL — ABNORMAL HIGH (ref 0.61–1.24)
GFR, Estimated: 58 mL/min — ABNORMAL LOW (ref >60)
Glucose, Bld: 104 mg/dL — ABNORMAL HIGH (ref 70–99)
Potassium: 4.1 mmol/L (ref 3.5–5.1)
Sodium: 140 mmol/L (ref 135–145)

## 2022-10-25 LAB — CBC WITH DIFFERENTIAL/PLATELET
Abs Immature Granulocytes: 0.01 10*3/uL (ref 0.00–0.07)
Basophils Absolute: 0 10*3/uL (ref 0.0–0.1)
Basophils Relative: 1 %
Eosinophils Absolute: 0.1 10*3/uL (ref 0.0–0.5)
Eosinophils Relative: 2 %
HCT: 41.6 % (ref 39.0–52.0)
Hemoglobin: 13.4 g/dL (ref 13.0–17.0)
Immature Granulocytes: 0 %
Lymphocytes Relative: 26 %
Lymphs Abs: 1.3 10*3/uL (ref 0.7–4.0)
MCH: 29.8 pg (ref 26.0–34.0)
MCHC: 32.2 g/dL (ref 30.0–36.0)
MCV: 92.4 fL (ref 80.0–100.0)
Monocytes Absolute: 0.5 10*3/uL (ref 0.1–1.0)
Monocytes Relative: 11 %
Neutro Abs: 3 10*3/uL (ref 1.7–7.7)
Neutrophils Relative %: 60 %
Platelets: 194 10*3/uL (ref 150–400)
RBC: 4.5 MIL/uL (ref 4.22–5.81)
RDW: 13.8 % (ref 11.5–15.5)
WBC: 5.1 10*3/uL (ref 4.0–10.5)
nRBC: 0 % (ref 0.0–0.2)

## 2022-10-25 LAB — MAGNESIUM: Magnesium: 2 mg/dL (ref 1.7–2.4)

## 2022-10-25 MED ORDER — SODIUM CHLORIDE 0.9 % IV BOLUS
1000.0000 mL | Freq: Once | INTRAVENOUS | Status: AC
  Administered 2022-10-25: 1000 mL via INTRAVENOUS

## 2022-10-25 MED ORDER — DILTIAZEM HCL 25 MG/5ML IV SOLN
10.0000 mg | Freq: Once | INTRAVENOUS | Status: AC
  Administered 2022-10-25: 10 mg via INTRAVENOUS
  Filled 2022-10-25: qty 5

## 2022-10-25 MED ORDER — DILTIAZEM HCL 30 MG PO TABS
30.0000 mg | ORAL_TABLET | Freq: Once | ORAL | Status: AC
  Administered 2022-10-25: 30 mg via ORAL
  Filled 2022-10-25: qty 1

## 2022-10-25 NOTE — ED Triage Notes (Signed)
Pt states he was trying to take his blood pressure, "it errored out." States since 9a, he's been unable to get blood pressure. Denies feeling lighteaded/ dizzy, "thankfully I have no additional symptoms." States he came for eval because he had multiple unsuccessful attempts at BP, knows that hx of aflutter can be dangerous.  Compliant w home medications

## 2022-10-25 NOTE — Telephone Encounter (Signed)
May trial Pancreaze if able (there seems to be a resource for Savings/Support). 37000 capsules. 2 pills with each meal and 1 with each snack. Thanks. GM

## 2022-10-25 NOTE — Discharge Instructions (Signed)
Please continue to take your medications as prescribed.  Please drink as well as you can for the next couple days.  Follow-up with your cardiologist in the office.  I provided you information to call the A-fib clinic, typically they can see you within the week to have your reassessed.  I have also put a order for Dr. Jacalyn Lefevre office to reach out to you as well.

## 2022-10-25 NOTE — Telephone Encounter (Signed)
Dr. Rush Landmark, not sure if you have other recommendations. He did not tolerate Creon. Do you want him to try otc digestive enzyme?  Pls forward your recommendations to Remo Lipps as I will be off work 2/19 and 2/20. THX

## 2022-10-25 NOTE — ED Provider Notes (Signed)
[facility] Provider Note   CSN: [id number] Arrival date & time: 10/25/22  1624     History  No chief complaint on file.   [name] [name] is a 82 y.o. male.  82 yo M with a chief complaints of feel like he might be in A-fib.  He tells me that he is only been it once before and that was noted at his cardiologist office and he was started on Eliquis.  He eventually came out of rhythm and has had no issues since.  He had been try to check his blood pressure this morning and it was having difficulty finding a pressure which made him concerned that maybe he was back in A-fib.  Last time he felt a bit anxious but has no symptoms with that at this time.  He denies any issues taking his medication.  He has been taking some over-the-counter medication for his pancreatic enzymes but otherwise states that he is had no new medications.  He denies cough congestion or fever denies decreased oral intake denies nausea vomiting or diarrhea.  Denies chest pain or pressure denies shortness of breath denies leg swelling.        Home Medications Prior to Admission medications   Medication Sig Start Date End Date Taking? Authorizing Provider  acetaminophen (TYLENOL) 500 MG tablet Take 1 tablet (500 mg total) by mouth every 6 (six) hours as needed for moderate pain or mild pain. 04/18/22   Mansouraty, Telford Nab., MD  apixaban (ELIQUIS) 5 MG TABS tablet Take 1 tablet (5 mg total) by mouth 2 (two) times daily. 06/18/22   Tolia, Sunit, DO  carbidopa-levodopa (SINEMET CR) 50-200 MG tablet TAKE 1 TABLET AT BEDTIME 09/04/22   Tat, Rebecca S, DO  carbidopa-levodopa (SINEMET IR) 25-100 MG tablet 3 tablets at 7 AM/2 at 11 AM/ 2 tablets at 3 PM/ 1 tablet at 4 pm / 2 tablet at 7 PM 10/15/22   Tat, Wells Guiles S, DO  clonazePAM (KLONOPIN) 0.5 MG tablet Take 0.5 mg by mouth at bedtime. 02/17/21   [provider]  diltiazem (CARDIZEM) 30 MG tablet Take 1 tablet (30 mg total)  by mouth 2 (two) times daily. 12/23/21 08/17/22  Tolia, Sunit, DO  Glycerin-Hypromellose-PEG 400 (VISINE DRY EYE OP) Place 1 drop into both eyes 2 (two) times daily as needed (eye irritation).     [provider]  ketoconazole (NIZORAL) 2 % shampoo Apply 1 application  topically 3 (three) times a week. 02/15/19   [provider]  leuprolide, 6 Month, (ELIGARD) 45 MG injection Inject 45 mg into the skin every 6 (six) months.    [provider]  Levodopa (INBRIJA) 42 MG CAPS Inhale 2 capsules prn up to 5 times per day 05/05/22   Tat, Eustace Quail, DO  omeprazole (PRILOSEC) 20 MG capsule TAKE 1 CAPSULE DAILY 09/09/22   Truitt Merle, MD  ondansetron (ZOFRAN) 8 MG tablet Take 1 tablet (8 mg total) by mouth 2 (two) times daily as needed (Nausea or vomiting). Patient taking differently: Take 8 mg by mouth 2 (two) times daily as needed for vomiting or nausea. 01/05/22   Truitt Merle, MD  oxyCODONE (OXY IR/ROXICODONE) 5 MG immediate release tablet Take 0.5-1 tablets (2.5-5 mg total) by mouth every 12 (twelve) hours as needed for severe pain. 10/02/22   Truitt Merle, MD  Pancrelipase, Lip-Prot-Amyl, (ZENPEP) [id number] units CPEP Take one capsule by mouth tid before meals Patient not taking: Reported on 10/13/2022 09/24/22  Noralyn Pick, NP  prochlorperazine (COMPAZINE) 10 MG tablet Take 1 tablet (10 mg total) by mouth every 6 (six) hours as needed (Nausea or vomiting). Patient taking differently: Take 10 mg by mouth every 6 (six) hours as needed for vomiting or nausea. 01/05/22   Truitt Merle, MD  rosuvastatin (CRESTOR) 10 MG tablet TAKE 1 TABLET DAILY 06/17/22   Tolia, Sunit, DO  triamcinolone cream (KENALOG) 0.1 % Apply 1 application topically 2 (two) times daily. Patient taking differently: Apply 1 application  topically 2 (two) times daily as needed (itching). 01/16/21   Juline Patch, MD  zoledronic acid (RECLAST) 5 MG/100ML SOLN injection Inject 5 mg into the vein See admin instructions.  Once a year    [provider]      Allergies    Influenza vaccines    Review of Systems   Review of Systems  Physical Exam Updated Vital Signs BP 111/86 (BP Location: Right Arm)   Pulse (!) 57   Temp (!) 97.3 F (36.3 C)   Resp 18   SpO2 99%  Physical Exam Vitals and nursing note reviewed.  Constitutional:      Appearance: He is well-developed.  HENT:     Head: Normocephalic and atraumatic.  Eyes:     Pupils: Pupils are equal, round, and reactive to light.  Neck:     Vascular: No JVD.  Cardiovascular:     Rate and Rhythm: Tachycardia present. Rhythm irregular.     Heart sounds: No murmur heard.    No friction rub. No gallop.  Pulmonary:     Effort: No respiratory distress.     Breath sounds: No wheezing.  Abdominal:     General: There is no distension.     Tenderness: There is no abdominal tenderness. There is no guarding or rebound.  Musculoskeletal:        General: Normal range of motion.     Cervical back: Normal range of motion and neck supple.  Skin:    Coloration: Skin is not pale.     Findings: No rash.  Neurological:     Mental Status: He is alert and oriented to person, place, and time.  Psychiatric:        Behavior: Behavior normal.     ED Results / Procedures / Treatments   Labs (all labs ordered are listed, but only abnormal results are displayed) Labs Reviewed  BASIC METABOLIC PANEL - Abnormal; Notable for the following components:      Result Value   Glucose, Bld 104 (*)    Creatinine, Ser 1.25 (*)    GFR, Estimated 58 (*)    All other components within normal limits  CBC WITH DIFFERENTIAL/PLATELET  MAGNESIUM    EKG EKG Interpretation  Date/Time:  Sunday October 25 2022 16:40:50 EST Ventricular Rate:  123 PR Interval:    QRS Duration: 126 QT Interval:  372 QTC Calculation: 532 R Axis:   -30 Text Interpretation: Atrial fibrillation with rapid ventricular response Left axis deviation Right bundle branch block Abnormal  ECG No significant change since last tracing Confirmed by Deno Etienne [phone]) on 10/25/2022 4:47:55 PM  Radiology No results found.  Procedures Procedures    Medications Ordered in ED Medications  sodium chloride 0.9 % bolus 1,000 mL (1,000 mLs Intravenous New Bag/Given 10/25/22 1710)  diltiazem (CARDIZEM) injection 10 mg (10 mg Intravenous Given 10/25/22 1715)  diltiazem (CARDIZEM) tablet 30 mg (30 mg Oral Given 10/25/22 1715)    ED Course/ Medical Decision  Making/ A&P                             Medical Decision Making Amount and/or Complexity of Data Reviewed Labs: ordered.  Risk Prescription drug management.   82 yo M with a cc of feels like he must be in A-fib because his blood pressure cuff was having trouble checking his blood pressure at home.  This was noticed this morning.  He was able to check it yesterday.  He has no symptoms otherwise.  He is compliant with his Eliquis and denies any missed doses.  Will obtain electrolytes.  Bolus of IV fluids.  Rate control.  Will discuss with him about possible cardioversion.  Patient with may be a very modest bump in his creatinine.  Otherwise no acute anemia, no significant electrolyte abnormality.  He was given a bolus dose of diltiazem and extra dose of his home medication.  His rate has come down a bit.  He continues to be asymptomatic.  Discussed risk and benefits of cardioversion in the emergency department and he elects to go out with rate control and follow-up with his cardiologist in the office.  6:15 PM:  I have discussed the diagnosis/risks/treatment options with the patient and family.  Evaluation and diagnostic testing in the emergency department does not suggest an emergent condition requiring admission or immediate intervention beyond what has been performed at this time.  They will follow up with PCP, cards. We also discussed returning to the ED immediately if new or worsening sx occur. We discussed the sx which are most  concerning (e.g., sudden worsening pain, fever, inability to tolerate by mouth) that necessitate immediate return. Medications administered to the patient during their visit and any new prescriptions provided to the patient are listed below.  Medications given during this visit Medications  sodium chloride 0.9 % bolus 1,000 mL (1,000 mLs Intravenous New Bag/Given 10/25/22 1710)  diltiazem (CARDIZEM) injection 10 mg (10 mg Intravenous Given 10/25/22 1715)  diltiazem (CARDIZEM) tablet 30 mg (30 mg Oral Given 10/25/22 1715)     The patient appears reasonably screen and/or stabilized for discharge and I doubt any other medical condition or other [facility] requiring further screening, evaluation, or treatment in the ED at this time prior to discharge.          Final Clinical Impression(s) / ED Diagnoses Final diagnoses:  Paroxysmal atrial fibrillation ([place])    Rx / DC Orders ED Discharge Orders          Ordered    Ambulatory referral to Cardiology       Comments: If you have not heard from the Cardiology office within the next 72 hours please call [phone].   10/25/22 [place], Letcher, DO 10/25/22 1815

## 2022-10-25 NOTE — ED Notes (Signed)
Pt given discharge instructions and reviewed prescriptions. Opportunities given for questions. Pt verbalizes understanding. PIV removed x1. Leanne Chang, RN

## 2022-10-26 ENCOUNTER — Telehealth: Payer: Self-pay

## 2022-10-26 ENCOUNTER — Other Ambulatory Visit: Payer: Self-pay

## 2022-10-26 ENCOUNTER — Other Ambulatory Visit (HOSPITAL_COMMUNITY): Payer: Self-pay

## 2022-10-26 ENCOUNTER — Encounter: Payer: Self-pay | Admitting: Cardiology

## 2022-10-26 ENCOUNTER — Ambulatory Visit: Payer: Medicare Other | Admitting: Cardiology

## 2022-10-26 VITALS — BP 94/57 | HR 47 | Resp 17 | Ht 64.0 in | Wt 153.4 lb

## 2022-10-26 DIAGNOSIS — M5441 Lumbago with sciatica, right side: Secondary | ICD-10-CM | POA: Diagnosis not present

## 2022-10-26 DIAGNOSIS — I700 Atherosclerosis of aorta: Secondary | ICD-10-CM | POA: Diagnosis not present

## 2022-10-26 DIAGNOSIS — R140 Abdominal distension (gaseous): Secondary | ICD-10-CM

## 2022-10-26 DIAGNOSIS — M545 Low back pain, unspecified: Secondary | ICD-10-CM | POA: Diagnosis not present

## 2022-10-26 DIAGNOSIS — G4733 Obstructive sleep apnea (adult) (pediatric): Secondary | ICD-10-CM | POA: Diagnosis not present

## 2022-10-26 DIAGNOSIS — Z7901 Long term (current) use of anticoagulants: Secondary | ICD-10-CM | POA: Diagnosis not present

## 2022-10-26 DIAGNOSIS — C259 Malignant neoplasm of pancreas, unspecified: Secondary | ICD-10-CM | POA: Diagnosis not present

## 2022-10-26 DIAGNOSIS — I4510 Unspecified right bundle-branch block: Secondary | ICD-10-CM | POA: Diagnosis not present

## 2022-10-26 DIAGNOSIS — C61 Malignant neoplasm of prostate: Secondary | ICD-10-CM | POA: Diagnosis not present

## 2022-10-26 DIAGNOSIS — I4891 Unspecified atrial fibrillation: Secondary | ICD-10-CM | POA: Diagnosis not present

## 2022-10-26 DIAGNOSIS — K5900 Constipation, unspecified: Secondary | ICD-10-CM

## 2022-10-26 DIAGNOSIS — E782 Mixed hyperlipidemia: Secondary | ICD-10-CM

## 2022-10-26 DIAGNOSIS — M25551 Pain in right hip: Secondary | ICD-10-CM | POA: Diagnosis not present

## 2022-10-26 MED ORDER — PANCREAZE 37000-97300 UNITS PO CPEP: 2.0000 | ORAL_CAPSULE | Freq: Three times a day (TID) | ORAL | 1 refills | Status: DC

## 2022-10-26 NOTE — Progress Notes (Signed)
Date:  10/26/2022   ID:  [name] [name], DOB Jan 28, 1941, MRN [id number]  PCP:  [name], MD  Cardiologist:  Rex Kras, DO, [facility] (established care 04/10/2021) Former Cardiologist: Isaias Cowman, MD  Date: 10/26/22 Last Office Visit: 06/25/2022  Chief Complaint  Patient presents with   Atrial Fibrillation   Follow-up    Sick visit  Recent ED visit     HPI  [name] [name] is a 82 y.o. 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  Patient is being followed by the practice for paroxysmal atrial flutter which is currently being treated with rate control medications and anticoagulation for thromboembolic prophylaxis.  He presents today for sick visit after he went to the ED due to symptomatic atrial fibrillation.  His ventricular rate improved with IV Cardizem and he was started on oral medications.  In the ED he chose not to proceed with cardioversion despite being on anticoagulation for reasons unknown.  Patient states that he denies anginal discomfort or heart failure symptoms.  He just feels tired fatigue and at times lightheaded and dizzy.  Has been noticing episodes of RVR and hypotension.  At times is blood pressure machine gives him an error.   He is accompanied by his daughter [name] [name] who provides collateral history  FUNCTIONAL STATUS: Walks about 0.5 miles per day.  ALLERGIES: Allergies  Allergen Reactions   Influenza Vaccines Anaphylaxis    Flu shot: 1974 anaphylaxis. 2nd time swollen arm    MEDICATION LIST PRIOR TO VISIT: Current Meds  Medication Sig   acetaminophen (TYLENOL) 500 MG tablet Take 1 tablet (500 mg total) by mouth every 6 (six) hours as needed for moderate pain or mild pain.    apixaban (ELIQUIS) 5 MG TABS tablet Take 1 tablet (5 mg total) by mouth 2 (two) times daily.   carbidopa-levodopa (SINEMET CR) 50-200 MG tablet TAKE 1 TABLET AT BEDTIME   carbidopa-levodopa (SINEMET IR) 25-100 MG tablet 3 tablets at 7 AM/2 at 11 AM/ 2 tablets at 3 PM/ 1 tablet at 4 pm / 2 tablet at 7 PM   clonazePAM (KLONOPIN) 0.5 MG tablet Take 0.5 mg by mouth at bedtime.   diltiazem (CARDIZEM) 30 MG tablet Take 1 tablet (30 mg total) by mouth 2 (two) times daily.   Glycerin-Hypromellose-PEG 400 (VISINE DRY EYE OP) Place 1 drop into both eyes 2 (two) times daily as needed (eye irritation).    ketoconazole (NIZORAL) 2 % shampoo Apply 1 application  topically 3 (three) times a week.   leuprolide, 6 Month, (ELIGARD) 45 MG injection Inject 45 mg into the skin every 6 (six) months.   omeprazole (PRILOSEC) 20 MG capsule TAKE 1 CAPSULE DAILY   ondansetron (ZOFRAN) 8 MG tablet Take 1 tablet (8 mg total) by mouth 2 (two) times daily as needed (Nausea or vomiting). (Patient taking differently: Take 8 mg by mouth 2 (two) times daily as needed for vomiting or nausea.)   oxyCODONE (OXY IR/ROXICODONE) 5 MG immediate release tablet Take 0.5-1 tablets (2.5-5 mg total) by mouth every 12 (twelve) hours as needed for severe pain.   prochlorperazine (COMPAZINE) 10 MG tablet Take 1 tablet (10 mg total) by mouth every 6 (six) hours as needed (Nausea or vomiting). (Patient taking differently: Take 10 mg by mouth every 6 (six)  hours as needed for vomiting or nausea.)   rosuvastatin (CRESTOR) 10 MG tablet TAKE 1 TABLET DAILY   triamcinolone cream (KENALOG) 0.1 % Apply 1 application topically 2 (two) times daily. (Patient taking differently: Apply 1 application  topically 2 (two) times daily as needed (itching).)   zoledronic acid (RECLAST) 5 MG/100ML SOLN injection Inject 5 mg into the vein See admin instructions. 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 ([place])    Prostate cancer metastatic to bone ([place])    REM sleep behavior disorder    Sleep apnea    BiPap    PAST SURGICAL HISTORY: Past Surgical History:  Procedure Laterality Date   APPENDECTOMY  1963   BIOPSY  12/25/2021   Procedure: BIOPSY;  Surgeon: Irving Copas., MD;  Location: [facility] ENDOSCOPY;  Service: Gastroenterology;;   COLONOSCOPY  2010, 2015   ESOPHAGOGASTRODUODENOSCOPY N/A 04/16/2022   Procedure: ESOPHAGOGASTRODUODENOSCOPY (EGD);  Surgeon: Irving Copas., MD;  Location: [place];  Service: Gastroenterology;  Laterality: N/A;   ESOPHAGOGASTRODUODENOSCOPY (EGD) WITH PROPOFOL N/A 12/25/2021   Procedure: ESOPHAGOGASTRODUODENOSCOPY (EGD) WITH PROPOFOL;  Surgeon: Rush Landmark Telford Nab., MD;  Location: [place];  Service: Gastroenterology;  Laterality: N/A;   EUS N/A 12/25/2021   Procedure: UPPER ENDOSCOPIC ULTRASOUND (EUS) RADIAL;  Surgeon: Irving Copas., MD;  Location: [place];  Service: Gastroenterology;  Laterality: N/A;   EUS N/A 04/16/2022   Procedure: UPPER ENDOSCOPIC ULTRASOUND (EUS) RADIAL;  Surgeon: Irving Copas., MD;  Location: [place];  Service: Gastroenterology;  Laterality: N/A;   FIDUCIAL MARKER PLACEMENT N/A 04/16/2022   Procedure: FIDUCIAL MARKER PLACEMENT;  Surgeon: Irving Copas., MD;  Location: [place];  Service: Gastroenterology;  Laterality: N/A;   FINE NEEDLE ASPIRATION  12/25/2021   Procedure: FINE NEEDLE ASPIRATION (FNA) LINEAR;  Surgeon: Irving Copas., MD;  Location[facility] ENDOSCOPY;  Service: Gastroenterology;;   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   KYPHOPLASTY N/A 08/15/2020   Procedure: L2 compression fracture;  Surgeon: Hessie Knows, MD;  Location: [facility] ORS;  Service: Orthopedics;  Laterality:  N/A;   KYPHOPLASTY N/A 08/29/2020   Procedure: T8 KYPHOPLASTY;  Surgeon: Hessie Knows, MD;  Location: [facility] ORS;  Service: Orthopedics;  Laterality: N/A;   KYPHOPLASTY N/A 11/12/2020   Procedure: L1 KYPHOPLASTY;  Surgeon: Hessie Knows, MD;  Location: [facility] ORS;  Service: Orthopedics;  Laterality: N/A;   PORTACATH PLACEMENT N/A 02/05/2022   Procedure: INSERTION PORT-A-CATH;  Surgeon: Dwan Bolt, MD;  Location: [facility];  Service: General;  Laterality: N/A;    FAMILY HISTORY: The patient family history includes Breast cancer in his daughter; Diabetes in his paternal grandfather; Heart disease in his maternal grandfather.  SOCIAL HISTORY:  The patient  reports that he quit smoking about 12 years ago. His smoking use included cigars. He has never used smokeless tobacco. He reports current alcohol use. He reports that he does not use drugs.  REVIEW OF SYSTEMS: Review of Systems  Constitutional: Positive for malaise/fatigue.  Cardiovascular:  Negative for chest pain, claudication, dyspnea on exertion, irregular heartbeat, leg swelling, near-syncope, orthopnea, palpitations, paroxysmal nocturnal dyspnea and syncope.  Respiratory:  Negative for shortness of breath.   Hematologic/Lymphatic: Negative for bleeding problem.  Musculoskeletal:  Negative for muscle cramps and myalgias.  Neurological:  Negative for dizziness and light-headedness.    PHYSICAL EXAM:    10/26/2022   11:48 AM 10/25/2022    5:45 PM 10/25/2022    5:30 PM  Vitals with BMI  Height 5' 4"$     Weight 153 lbs 6 oz    BMI [id number]    Systolic 94 [id number] 95  Diastolic 57 76 72  Pulse 47 68 72   Physical Exam  Constitutional: No distress. He appears chronically ill.   hemodynamically stable, ambulates w/ walker  Neck: No JVD present.  Cardiovascular: Normal rate, S1 normal, S2 normal, intact distal pulses and normal pulses. An irregularly irregular rhythm present. Exam reveals no gallop, no S3 and no S4.  Murmur  heard. Holosystolic murmur is present with a grade of 3/6 at the apex. Pulses:      Dorsalis pedis pulses are 2+ on the right side and 2+ on the left side.       Posterior tibial pulses are 2+ on the right side and 2+ on the left side.  Pulmonary/Chest: Effort normal and breath sounds normal. No stridor. He has no wheezes. He has no rales.  Abdominal: Soft. Bowel sounds are normal. He exhibits no distension. There is no abdominal tenderness.  Musculoskeletal:        General: No edema.     Cervical back: Neck supple.  Neurological: He is alert and oriented to person, place, and time. He has intact cranial nerves (2-12).  Skin: Skin is warm and moist.    CARDIAC DATABASE: EKG: 06/25/2022: Sinus bradycardia, 54 bpm, with sinus arrhythmia, right bundle branch block, without underlying injury pattern, T WI likely secondary to RBBB. 10/26/2022: Atrial fibrillation, 88 bpm, RBBB, left axis.  When compared to 06/25/2022 sinus bradycardia has transitioned to atrial fibrillation.  Echocardiogram: 04/30/2021: Normal LV systolic function with EF 63%. Moderate concentric hypertrophy of the left ventricle. Normal global wall motion. Left ventricle cavity is normal in size.  Doppler evidence of grade II (pseudonormal) diastolic dysfunction, elevated LAP.  Left atrial cavity is moderately dilated. Trileaflet aortic valve. Moderate (Grade II) aortic regurgitation. Mild to moderate mitral regurgitation. Mild tricuspid regurgitation.  No evidence of pulmonary hypertension.   Stress Testing: Lexiscan Tetrofosmin stress test 04/30/2021: 1 Day Rest/Stress Protocol. Stress EKG is non-diagnostic for ischemia as its a pharmacologic stress test using Lexiscan. Normal myocardial perfusion without convincing evidence of reversible myocardial ischemia or prior infarct.   Left ventricular ejection fraction is 55% with normal wall motion.   Low risk study. No prior studies for comparison.   Heart  Catheterization: None  LABORATORY DATA:    Latest Ref Rng & Units 10/25/2022    4:51 PM 10/13/2022   10:32 AM 08/12/2022    3:12 PM  CBC  WBC 4.0 - 10.5 K/uL 5.1  4.6  5.6   Hemoglobin 13.0 - 17.0 g/dL 13.4  13.3  13.5   Hematocrit 39.0 - 52.0 % 41.6  41.0  41.7   Platelets 150 - 400 K/uL 194  193  208        Latest Ref Rng & Units 10/25/2022    4:51 PM 10/13/2022   10:32 AM 08/12/2022    3:12 PM  CMP  Glucose 70 - 99 mg/dL 104  93  82   BUN 8 - 23 mg/dL 21  15  21   $ Creatinine 0.61 - 1.24 mg/dL 1.25  1.09  1.12   Sodium 135 - 145 mmol/L  140  145  146   Potassium 3.5 - 5.1 mmol/L 4.1  4.5  4.0   Chloride 98 - 111 mmol/L 107  111  112   CO2 22 - 32 mmol/L 25  29  27   $ Calcium 8.9 - 10.3 mg/dL 9.9  10.1  10.0   Total Protein 6.5 - 8.1 g/dL  6.8  7.7   Total Bilirubin 0.3 - 1.2 mg/dL  1.2  0.7   Alkaline Phos 38 - 126 U/L  57  56   AST 15 - 41 U/L  25  30   ALT 0 - 44 U/L  10  12     Lipid Panel     Component Value Date/Time   CHOL 124 12/04/2021 1359   TRIG 146 12/04/2021 1359   HDL 66 12/04/2021 1359   CHOLHDL 3.0 08/17/2019 0445   VLDL 20 08/17/2019 0445   LDLCALC 34 12/04/2021 1359   LDLDIRECT 43 12/04/2021 1359   LABVLDL 24 12/04/2021 1359    No components found for: "NTPROBNP" No results for input(s): "PROBNP" in the last 8760 hours. No results for input(s): "TSH" in the last 8760 hours.  BMP Recent Labs    08/12/22 1512 10/13/22 1032 10/25/22 1651  NA 146* 145 140  K 4.0 4.5 4.1  CL 112* 111 107  CO2 27 29 25  $ GLUCOSE 82 93 104*  BUN 21 15 21  $ CREATININE 1.12 1.09 1.25*  CALCIUM 10.0 10.1 9.9  GFRNONAA >60 >60 58*    HEMOGLOBIN A1C No results found for: "HGBA1C", "MPG"  IMPRESSION:    ICD-10-CM   1. Atrial fibrillation with controlled ventricular rate (HCC)  I48.91 EKG 12-Lead    2. Long term (current) use of anticoagulants  Z79.01     3. Atherosclerosis of aorta (HCC)  I70.0     4. Mixed hyperlipidemia  E78.2     5. OSA treated with  BiPAP  G47.33     6. 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 Prostate cancer ([place])  C61     8. Malignant neoplasm of pancreas, unspecified location of malignancy ([place])  C25.9        RECOMMENDATIONS: ROLAN NOTEBOOM is a 82 y.o. 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  Atrial fibrillation with controlled ventricular rate (HCC) History of paroxysmal atrial flutter. Rate control: Diltiazem. Rhythm control: N/A. Thromboembolic prophylaxis: Eliquis CHA2DS2-VASc SCORE is 3 which correlates to 3% risk of stroke per year (age, atherosclerosis).  Diagnosed in 2020. Though his rates are controlled he has episodes of RVR and symptomatic with regards to feeling tired/fatigue at times lightheaded.  We discussed antiarrhythmic medications versus direct-current cardioversion.  Patient states that he feels very symptomatic and does not want to prolong being in A-fib.  We discussed undergoing TEE guided cardioversion as he may have skipped a few dose of anticoagulation in the last 4 weeks.  After careful review of history and examination, the risks, benefits of transesophageal echocardiogram, and alternatives have been explained to the patient and daughter. Complications include but not limited to esophageal perforation (rare), gastrointestinal bleeding (rare), cardiac arrhythmia which can include cardiac arrest and death (rare), pharyngeal irritation / discomfort with swallowing / hematoma, methemoglobinemia, bronchospasm, transient hypoxia, nonsustained ventricular tachycardia, transient atrial fibrillation, minimal hemoptysis, vomiting,  hypotension, respiratory compromise, reaction to medications, unavoidable damage to teeth and gums, aspiration pneumonia  were reviewed with the  patient.  Patient voices understands, provides verbal feedback, questions answered, and patient and his daughter wishes to proceed with the procedure.  Risks, benefits, and alternatives of direct current cardioversion reviewed with the and patient and his daughter. Risk includes but not limited to: potential for post-cardioversion rhythms, life-threatening arrhythmias (ventricular tachycardia and fibrillation, profound bradycardia, cardiac arrest), myocardial damage, acute pulmonary edema, skin burns, transient hypotension. Benefits include restoration of sinus rhythm. Alternatives to treatment were discussed, questions were answered, patient voices understanding and provides verbal feedback.  Patient is willing to proceed.    Patient is asked to check his blood pressures when he is due for his diltiazem dose.  He is asked to hold diltiazem if his SBP is less than 120 mmHg.  Long term (current) use of anticoagulants Does not endorse evidence of bleeding. Reemphasized the risks, benefits, and alternatives to anticoagulation.  Atherosclerosis of aorta (HCC) Chronic and stable. Currently on statin therapy.  Prostate cancer [facility]) Malignant neoplasm of pancreas, unspecified location of malignancy [facility]) Currently being followed by other providers and care team  Patient presents today with his daughter for sick visit due to symptomatic A-fib.  Recent ER records reviewed.  EKG performed and independently reviewed.  Discussed risks, benefits, and alternatives to both transesophageal echocardiogram and direct-current cardioversion as outlined above, medication changes as discussed above, recent labs independently reviewed and noted above for further reference.  FINAL MEDICATION LIST END OF ENCOUNTER: No orders of the defined types were placed in this encounter.    Medications Discontinued During This Encounter  Medication Reason   Levodopa (INBRIJA) 42 MG CAPS Patient Preference      Current  Outpatient Medications:    acetaminophen (TYLENOL) 500 MG tablet, Take 1 tablet (500 mg total) by mouth every 6 (six) hours as needed for moderate pain or mild pain., Disp: 30 tablet, Rfl: 0   apixaban (ELIQUIS) 5 MG TABS tablet, Take 1 tablet (5 mg total) by mouth 2 (two) times daily., Disp: 120 tablet, Rfl: 3   carbidopa-levodopa (SINEMET CR) 50-200 MG tablet, TAKE 1 TABLET AT BEDTIME, Disp: 90 tablet, Rfl: 0   carbidopa-levodopa (SINEMET IR) 25-100 MG tablet, 3 tablets at 7 AM/2 at 11 AM/ 2 tablets at 3 PM/ 1 tablet at 4 pm / 2 tablet at 7 PM, Disp: 900 tablet, Rfl: 0   clonazePAM (KLONOPIN) 0.5 MG tablet, Take 0.5 mg by mouth at bedtime., Disp: , Rfl:    diltiazem (CARDIZEM) 30 MG tablet, Take 1 tablet (30 mg total) by mouth 2 (two) times daily., Disp: 180 tablet, Rfl: 1   Glycerin-Hypromellose-PEG 400 (VISINE DRY EYE OP), Place 1 drop into both eyes 2 (two) times daily as needed (eye irritation). , Disp: , Rfl:    ketoconazole (NIZORAL) 2 % shampoo, Apply 1 application  topically 3 (three) times a week., Disp: , Rfl:    leuprolide, 6 Month, (ELIGARD) 45 MG injection, Inject 45 mg into the skin every 6 (six) months., Disp: , Rfl:    omeprazole (PRILOSEC) 20 MG capsule, TAKE 1 CAPSULE DAILY, Disp: 90 capsule, Rfl: 3   ondansetron (ZOFRAN) 8 MG tablet, Take 1 tablet (8 mg total) by mouth 2 (two) times daily as needed (Nausea or vomiting). (Patient taking differently: Take 8 mg by mouth 2 (two) times daily as needed for vomiting or nausea.), Disp: 30 tablet, Rfl: 1   oxyCODONE (OXY IR/ROXICODONE)  5 MG immediate release tablet, Take 0.5-1 tablets (2.5-5 mg total) by mouth every 12 (twelve) hours as needed for severe pain., Disp: 30 tablet, Rfl: 0   prochlorperazine (COMPAZINE) 10 MG tablet, Take 1 tablet (10 mg total) by mouth every 6 (six) hours as needed (Nausea or vomiting). (Patient taking differently: Take 10 mg by mouth every 6 (six) hours as needed for vomiting or nausea.), Disp: 30 tablet, Rfl:  1   rosuvastatin (CRESTOR) 10 MG tablet, TAKE 1 TABLET DAILY, Disp: 90 tablet, Rfl: 3   triamcinolone cream (KENALOG) 0.1 %, Apply 1 application topically 2 (two) times daily. (Patient taking differently: Apply 1 application  topically 2 (two) times daily as needed (itching).), Disp: 453.6 g, Rfl: 1   zoledronic acid (RECLAST) 5 MG/100ML SOLN injection, Inject 5 mg into the vein See admin instructions. Once a year, Disp: , Rfl:    Pancrelipase, Lip-Prot-Amyl, (PANCREAZE) 37000-97300 units CPEP, Take 2 capsules (74,000 Units total) by mouth with breakfast, with lunch, and with evening meal. Take 2 capsules with each meal and 1 with snack (Patient not taking: Reported on 10/26/2022), Disp: 600 capsule, Rfl: 1  Orders Placed This Encounter  Procedures   EKG 12-Lead   There are no Patient Instructions on file for this visit.   --Continue cardiac medications as reconciled in final medication list. --Return in about 2 weeks (around 11/09/2022) for Follow up, A. fib, s/p DDCV. Or sooner if needed. --Continue follow-up with your primary care physician regarding the management of your other chronic comorbid conditions.  Patient's questions and concerns were addressed to his satisfaction. He voices understanding of the instructions provided during this encounter.   This note was created using a voice recognition software as a result there may be grammatical errors inadvertently enclosed that do not reflect the nature of this encounter. Every attempt is made to correct such errors.  Rex Kras, [place], [facility]  Pager: [phone] Office: [phone]

## 2022-10-26 NOTE — Telephone Encounter (Signed)
Sure have him coming in before lunch.   ST

## 2022-10-26 NOTE — Telephone Encounter (Signed)
PA for Zenpep was denied, therapy changed

## 2022-10-26 NOTE — Telephone Encounter (Signed)
done 

## 2022-10-26 NOTE — Telephone Encounter (Signed)
Patient calling with increased heart rate. He says it is currently at 114+. He was in the ED lastnight, for A-Fib where they gave him an IV of fluids to decrease his HR, per the patient. He denies chest pain and SOB. He is scheduled today at 3 but is wanting to know if he needs to come in sooner. He sees his orthopedic dr at 9:30 for back pain/numbness. He says the racing HR is the only symptom he has.

## 2022-10-26 NOTE — Telephone Encounter (Signed)
Pt daughter [name] made aware of PA approval.  Prescription sent to  [facility]: [name] made aware [name]  verbalized understanding with all questions answered.

## 2022-10-26 NOTE — Telephone Encounter (Signed)
Received notification from [employer] regarding a prior authorization for  [place]  . Authorization has been APPROVED from 09/26/22 to 09/06/2098.   Authorization # Key: [id number] - PA Case ID: [id number]  Plan prefers patient to fill through [facility]

## 2022-10-26 NOTE — Telephone Encounter (Signed)
Prescription sent to pharmacy: Pt daughter [name] made aware. [name] verbalized understanding with all questions answered.  [place]leaser assist with PA or savings/support

## 2022-10-28 ENCOUNTER — Ambulatory Visit (HOSPITAL_COMMUNITY): Payer: Medicare Other

## 2022-10-30 ENCOUNTER — Inpatient Hospital Stay: Payer: Medicare Other | Admitting: Hematology

## 2022-11-01 ENCOUNTER — Encounter (HOSPITAL_COMMUNITY): Payer: Self-pay | Admitting: Anesthesiology

## 2022-11-01 NOTE — Anesthesia Preprocedure Evaluation (Signed)
Anesthesia Evaluation  Patient identified by MRN, date of birth, ID band Patient awake    Reviewed: Allergy & Precautions, NPO status , Patient's Chart, lab work & pertinent test results  Airway        Dental   Pulmonary sleep apnea , former smoker          Cardiovascular + dysrhythmias (on Eliquis, diltiazem) Atrial Fibrillation + Valvular Problems/Murmurs AI and MR   HLD  Echocardiogram 04/30/2021:  Normal LV systolic function with EF 63%. Moderate concentric hypertrophy  of the left ventricle. Normal global wall motion. Left ventricle cavity is  normal in size. Doppler evidence of grade II (pseudonormal) diastolic  dysfunction, elevated LAP.  Left atrial cavity is moderately dilated.  Trileaflet aortic valve.  Moderate (Grade II) aortic regurgitation.  Mild to moderate mitral regurgitation.  Mild tricuspid regurgitation.  No evidence of pulmonary hypertension.   Normal myocardial perfusion test 04/30/2021    Neuro/Psych  Neuromuscular disease (Parkinson's disease)    GI/Hepatic ,GERD  Medicated,,Pancreatic cancer   Endo/Other    Renal/GU H/o kidney donation     Musculoskeletal osteoporosis   Abdominal   Peds  Hematology   Anesthesia Other Findings Prostate cancer with mets to bone  Reproductive/Obstetrics                             Anesthesia Physical Anesthesia Plan  ASA: 3  Anesthesia Plan: MAC   Post-op Pain Management: Minimal or no pain anticipated   Induction: Intravenous  PONV Risk Score and Plan: 1 and Propofol infusion  Airway Management Planned: Natural Airway and Nasal Cannula  Additional Equipment:   Intra-op Plan:   Post-operative Plan:   Informed Consent:   Plan Discussed with: CRNA and Anesthesiologist  Anesthesia Plan Comments: (Discussed with patient risks of MAC including, but not limited to, minor pain or discomfort, hearing people in the room,  and possible need for backup general anesthesia. Risks for general anesthesia also discussed including, but not limited to, sore throat, hoarse voice, chipped/damaged teeth, injury to vocal cords, nausea and vomiting, allergic reactions, lung infection, heart attack, stroke, and death. All questions answered. )        Anesthesia Quick Evaluation

## 2022-11-02 ENCOUNTER — Encounter (HOSPITAL_COMMUNITY)
Admission: RE | Disposition: A | Discharge status: Home / Self Care | Payer: Self-pay | Source: Home / Self Care | Attending: Cardiology

## 2022-11-02 ENCOUNTER — Ambulatory Visit (HOSPITAL_COMMUNITY): Payer: Medicare Other

## 2022-11-02 ENCOUNTER — Ambulatory Visit (HOSPITAL_COMMUNITY)
Admission: RE | Admit: 2022-11-02 | Discharge: 2022-11-02 | Disposition: A | Discharge status: Home / Self Care | Payer: Medicare Other | Attending: Cardiology | Admitting: Cardiology

## 2022-11-02 DIAGNOSIS — Z7901 Long term (current) use of anticoagulants: Secondary | ICD-10-CM | POA: Insufficient documentation

## 2022-11-02 DIAGNOSIS — I4891 Unspecified atrial fibrillation: Secondary | ICD-10-CM | POA: Insufficient documentation

## 2022-11-02 DIAGNOSIS — I4892 Unspecified atrial flutter: Secondary | ICD-10-CM

## 2022-11-02 DIAGNOSIS — Z538 Procedure and treatment not carried out for other reasons: Secondary | ICD-10-CM | POA: Insufficient documentation

## 2022-11-02 SURGERY — CANCELLED PROCEDURE
Anesthesia: Monitor Anesthesia Care

## 2022-11-02 NOTE — Progress Notes (Signed)
Patient in sinus bradycardia 47 bpm. He was taking diltiazem 30 mg bid, if SBP >120 mmHg. BP 120/70s today. I have asked him to hold diltiazem. Continue anticoagulation. If SBP consistently runs >140 mmHg, could add alternate antihypertensive agent. Patient will call our office then. Keep f/u w/Dr. Terri Skains.   Nigel Mormon, MD Pager: [phone] Office: [phone]

## 2022-11-02 NOTE — Progress Notes (Signed)
Pt in unit today for a TEE/cardioversion, after obtaining a 12 lead EKG it was shown that the pt was in sinus bradycardia. Dr. Virgina Jock spoke with pt and pt's procedure was cancelled today. Jobe Igo, RN

## 2022-11-03 ENCOUNTER — Encounter: Payer: Self-pay | Admitting: Internal Medicine

## 2022-11-03 ENCOUNTER — Other Ambulatory Visit: Payer: Self-pay

## 2022-11-03 NOTE — Telephone Encounter (Signed)
[name] [name] called and had a question about his Ct scan appointment and wanted to know how to obtain the oral contrast. I called him back and let him know we no longer give out oral contrast and they he will need to arrive to 2 hours prior to the appointment just incase he needs oral contrast and Radiology will be supplying contrast if needed. I also advise him not have anything to eat 6 hrs prior to the  appointment. I asked  the pt did I answer all his question and did he understand what was discuss and he verbalized back to be yes I understand.   Tula Nakayama., CMA

## 2022-11-04 ENCOUNTER — Encounter: Payer: Self-pay | Admitting: Hematology

## 2022-11-05 DIAGNOSIS — C61 Malignant neoplasm of prostate: Secondary | ICD-10-CM | POA: Diagnosis not present

## 2022-11-05 DIAGNOSIS — Z905 Acquired absence of kidney: Secondary | ICD-10-CM | POA: Diagnosis not present

## 2022-11-05 DIAGNOSIS — C7951 Secondary malignant neoplasm of bone: Secondary | ICD-10-CM | POA: Diagnosis not present

## 2022-11-05 DIAGNOSIS — R351 Nocturia: Secondary | ICD-10-CM | POA: Diagnosis not present

## 2022-11-06 ENCOUNTER — Ambulatory Visit (HOSPITAL_COMMUNITY)
Admission: RE | Admit: 2022-11-06 | Discharge: 2022-11-06 | Disposition: A | Discharge status: Home / Self Care | Payer: Medicare Other | Source: Ambulatory Visit | Attending: Nurse Practitioner | Admitting: Nurse Practitioner

## 2022-11-06 DIAGNOSIS — C250 Malignant neoplasm of head of pancreas: Secondary | ICD-10-CM | POA: Insufficient documentation

## 2022-11-06 DIAGNOSIS — K838 Other specified diseases of biliary tract: Secondary | ICD-10-CM | POA: Diagnosis not present

## 2022-11-06 DIAGNOSIS — N281 Cyst of kidney, acquired: Secondary | ICD-10-CM | POA: Diagnosis not present

## 2022-11-06 DIAGNOSIS — C259 Malignant neoplasm of pancreas, unspecified: Secondary | ICD-10-CM | POA: Diagnosis not present

## 2022-11-06 MED ORDER — IOHEXOL 300 MG/ML  SOLN
100.0000 mL | Freq: Once | INTRAMUSCULAR | Status: AC | PRN
  Administered 2022-11-06: 100 mL via INTRAVENOUS

## 2022-11-09 ENCOUNTER — Other Ambulatory Visit: Payer: Self-pay | Admitting: Cardiology

## 2022-11-09 DIAGNOSIS — G4733 Obstructive sleep apnea (adult) (pediatric): Secondary | ICD-10-CM | POA: Diagnosis not present

## 2022-11-09 DIAGNOSIS — G4752 REM sleep behavior disorder: Secondary | ICD-10-CM | POA: Diagnosis not present

## 2022-11-09 DIAGNOSIS — G20B1 Parkinson's disease with dyskinesia, without mention of fluctuations: Secondary | ICD-10-CM | POA: Diagnosis not present

## 2022-11-09 DIAGNOSIS — Z9989 Dependence on other enabling machines and devices: Secondary | ICD-10-CM | POA: Diagnosis not present

## 2022-11-09 DIAGNOSIS — I4892 Unspecified atrial flutter: Secondary | ICD-10-CM | POA: Diagnosis not present

## 2022-11-13 ENCOUNTER — Inpatient Hospital Stay: Payer: Medicare Other | Attending: Internal Medicine | Admitting: Hematology

## 2022-11-13 ENCOUNTER — Other Ambulatory Visit: Payer: Medicare Other

## 2022-11-13 ENCOUNTER — Other Ambulatory Visit: Payer: Self-pay

## 2022-11-13 ENCOUNTER — Encounter: Payer: Self-pay | Admitting: Hematology

## 2022-11-13 ENCOUNTER — Inpatient Hospital Stay: Payer: Medicare Other

## 2022-11-13 ENCOUNTER — Encounter: Payer: Self-pay | Admitting: Internal Medicine

## 2022-11-13 VITALS — BP 103/61 | HR 57 | Temp 97.8°F | Resp 16 | Ht 64.0 in | Wt 152.7 lb

## 2022-11-13 DIAGNOSIS — G20A1 Parkinson's disease without dyskinesia, without mention of fluctuations: Secondary | ICD-10-CM | POA: Diagnosis not present

## 2022-11-13 DIAGNOSIS — Z9049 Acquired absence of other specified parts of digestive tract: Secondary | ICD-10-CM | POA: Insufficient documentation

## 2022-11-13 DIAGNOSIS — Z7901 Long term (current) use of anticoagulants: Secondary | ICD-10-CM | POA: Insufficient documentation

## 2022-11-13 DIAGNOSIS — Z887 Allergy status to serum and vaccine status: Secondary | ICD-10-CM | POA: Diagnosis not present

## 2022-11-13 DIAGNOSIS — I700 Atherosclerosis of aorta: Secondary | ICD-10-CM | POA: Diagnosis not present

## 2022-11-13 DIAGNOSIS — C7951 Secondary malignant neoplasm of bone: Secondary | ICD-10-CM | POA: Insufficient documentation

## 2022-11-13 DIAGNOSIS — C259 Malignant neoplasm of pancreas, unspecified: Secondary | ICD-10-CM | POA: Diagnosis not present

## 2022-11-13 DIAGNOSIS — N183 Chronic kidney disease, stage 3 unspecified: Secondary | ICD-10-CM | POA: Diagnosis not present

## 2022-11-13 DIAGNOSIS — Z905 Acquired absence of kidney: Secondary | ICD-10-CM | POA: Diagnosis not present

## 2022-11-13 DIAGNOSIS — C61 Malignant neoplasm of prostate: Secondary | ICD-10-CM | POA: Diagnosis not present

## 2022-11-13 DIAGNOSIS — C250 Malignant neoplasm of head of pancreas: Secondary | ICD-10-CM | POA: Diagnosis not present

## 2022-11-13 DIAGNOSIS — Z79899 Other long term (current) drug therapy: Secondary | ICD-10-CM | POA: Diagnosis not present

## 2022-11-13 LAB — CMP (CANCER CENTER ONLY)
ALT: 3 U/L (ref 0–44)
AST: 22 U/L (ref 15–41)
Albumin: 4.1 g/dL (ref 3.5–5.0)
Alkaline Phosphatase: 52 U/L (ref 38–126)
Anion gap: 5 (ref 5–15)
BUN: 21 mg/dL (ref 8–23)
CO2: 29 mmol/L (ref 22–32)
Calcium: 9.5 mg/dL (ref 8.9–10.3)
Chloride: 107 mmol/L (ref 98–111)
Creatinine: 1.13 mg/dL (ref 0.61–1.24)
GFR, Estimated: >60 mL/min (ref >60)
Glucose, Bld: 112 mg/dL — ABNORMAL HIGH (ref 70–99)
Potassium: 4.5 mmol/L (ref 3.5–5.1)
Sodium: 141 mmol/L (ref 135–145)
Total Bilirubin: 0.7 mg/dL (ref 0.3–1.2)
Total Protein: 6.7 g/dL (ref 6.5–8.1)

## 2022-11-13 LAB — CBC WITH DIFFERENTIAL (CANCER CENTER ONLY)
Abs Immature Granulocytes: 0.01 10*3/uL (ref 0.00–0.07)
Basophils Absolute: 0 10*3/uL (ref 0.0–0.1)
Basophils Relative: 0 %
Eosinophils Absolute: 0.2 10*3/uL (ref 0.0–0.5)
Eosinophils Relative: 3 %
HCT: 39.4 % (ref 39.0–52.0)
Hemoglobin: 12.8 g/dL — ABNORMAL LOW (ref 13.0–17.0)
Immature Granulocytes: 0 %
Lymphocytes Relative: 22 %
Lymphs Abs: 1.2 10*3/uL (ref 0.7–4.0)
MCH: 30.5 pg (ref 26.0–34.0)
MCHC: 32.5 g/dL (ref 30.0–36.0)
MCV: 93.8 fL (ref 80.0–100.0)
Monocytes Absolute: 0.5 10*3/uL (ref 0.1–1.0)
Monocytes Relative: 9 %
Neutro Abs: 3.4 10*3/uL (ref 1.7–7.7)
Neutrophils Relative %: 66 %
Platelet Count: 150 10*3/uL (ref 150–400)
RBC: 4.2 MIL/uL — ABNORMAL LOW (ref 4.22–5.81)
RDW: 13.6 % (ref 11.5–15.5)
WBC Count: 5.3 10*3/uL (ref 4.0–10.5)
nRBC: 0 % (ref 0.0–0.2)

## 2022-11-13 MED ORDER — OXYCODONE HCL 5 MG PO TABS: 2.5000 mg | ORAL_TABLET | Freq: Two times a day (BID) | ORAL | 0 refills | Status: DC | PRN

## 2022-11-13 NOTE — Assessment & Plan Note (Addendum)
-  diagnosed in 09/2021, deemed poor surgical candidate due to his age and medical co-morbidities -S/p single agent gemcitabine 01/13/22 - 04/01/22 and 5 fx SBRT [name]) 05/04/22 - 05/14/22. CA 19-9 decreased after treatment -Surveillance CT 11/09/2022 showed slightly decreased size of pancreatic head mass, no evidence of metastatic disease. I personally reviewed his images and discussed the findings with him

## 2022-11-13 NOTE — Assessment & Plan Note (Addendum)
-  initially diagnosed with Gleason 4+5 prostate cancer in 04/2019 for elevated PSA of 26.5. Staging CT AP on 05/16/19 showed two suspicious left pelvic lesions. He was started on Eligard q41months -Patient also receives Reclast once a year. -he did not tolerate Zytiga -Last PSA drawn on 04/14/22 showed good response at <0.01  -continue Eligard every 6 months, will receive per urologist Dr. Tresa Moore  -restaging CT CAP 3/4//24 showed stable osseous metastasis at left iliac and pubic bones.

## 2022-11-13 NOTE — Progress Notes (Signed)
[facility]   [phone] [phone]   Clinic Follow up Note   Patient Care Team: [name], MD as PCP - General (Internal Medicine) Tat, Eustace Quail, DO as Consulting Physician (Neurology) Cammie Sickle, MD as Consulting Physician (Hematology and Oncology) Dwan Bolt, MD as Consulting Physician (General Surgery) Truitt Merle, MD as Consulting Physician (Hematology and Oncology)  Date of Service:  11/13/2022  CHIEF COMPLAINT: f/u of pancreatic cancer   CURRENT THERAPY:  Supportive care   ASSESSMENT:  [name] [name] is a 82 y.o. male with   Pancreatic cancer ([place]) -diagnosed in 09/2021, deemed poor surgical candidate due to his age and medical co-morbidities -S/p single agent gemcitabine 01/13/22 - 04/01/22 and 5 fx SBRT [name]) 05/04/22 - 05/14/22. CA 19-9 decreased after treatment -Surveillance CT 11/09/2022 showed slightly decreased size of pancreatic head mass, no evidence of metastatic disease. I personally reviewed his images and discussed the findings with him   Prostate cancer metastatic to bone [facility]) -initially diagnosed with Gleason 4+5 prostate cancer in 04/2019 for elevated PSA of 26.5. Staging CT AP on 05/16/19 showed two suspicious left pelvic lesions. He was started on Eligard q70month -Patient also receives Reclast once a year. -he did not tolerate Zytiga -Last PSA drawn on 04/14/22 showed good response at <0.01  -continue Eligard every 6 months, will receive per urologist Dr. MTresa Moore -restaging CT CAP 3/4//24 showed stable osseous metastasis at left iliac and pubic bones.    PLAN: -Lab and CT scan findings reviewed with patient and his daughter -Continue cancer surveillance -I added on PSA to his lab today. -Lab and follow-up in 2 months, and to repeat CT scan in 4 months.   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 Sclerotic 1.5 cm left  ischial lesion/ Sclerotic medial left iliac bone 1.5 cm lesion (series 2/image 47), increased from 1.0 cm. Gleason score of 4+5= 9; with almost all cores involved greater than 80%.  9/16 Lupron [id number] on 9/16. [Urology; Dr.Siniski]  # MID OCT 2020- Zytiga 1000 mg+ prednisone; stopped December 2021 [poor tolerance if with RVR]; DISCONTINUED.   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       [facility] cancer [facility] to bone ([facility] 05/24/2019 Initial Diagnosis   [facility]  [facility]ic cancer ([facility] 10/02/2021 Imaging   CT ABDOMEN PELVIS W CONTRAST   IMPRESSION: 1. There is new pancreatic ductal dilation with an indeterminate 19 mm hypodense low-density mass area in the head/uncinate process of the pancreas. Recommend further evaluation with dedicated MRI/MRCP with contrast. 2. No evidence of bowel obstruction.  Moderate rectal stool ball. 3. Scattered peripherally located clustered pulmonary nodules in the RIGHT middle lobe. Findings are likely infectious or inflammatory in etiology. Given history of malignancy, recommend follow-up as per clinical protocol.   10/28/2021 Imaging   MR ABDOMEN MRCP W WO CONTAST   IMPRESSION: 1. Examination is significantly limited by breath motion artifact throughout. 2. The main pancreatic duct is diffusely dilated from the level of the superior pancreatic head, measuring up to 0.7 cm. 3. In the inferior pancreatic head and uncinate, there is a multilobulated, fluid signal cystic lesion measuring 1.8 x 1.0 cm. Due  to breath motion artifact it is difficult to determine whether this communicates to the adjacent duct. There are multiple additional subcentimeter cystic lesions scattered throughout  the pancreas, several of which clearly communicate to the main pancreatic duct. 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 Recommend EUS/FNA for further diagnosis given the presence of pancreatic ductal dilatation. 4. Status post left nephrectomy. 5. No evidence of recurrent or metastatic disease in the abdomen.   12/25/2021 Procedure   UPPER ENDOSCOPIC ULTRASOUND-By Dr. Rush Landmark  - A mass-like region was identified in the pancreatic head where the pancreatic duct dilates with multiple cystic regions noted throughout the pancreas as well. 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 However, the endosonographic appearance is suspicious for potential adenocarcinoma. This was staged T2 N0 Mx by endosonographic criteria. T - No malignant-appearing lymph nodes were visualized in the celiac region (level 20), peripancreatic region and porta hepatis region.   12/25/2021 Pathology Results   CYTOLOGY - NON PAP  CASE: [id number]   FINAL MICROSCOPIC DIAGNOSIS:  A. PANCREAS, HEAD, FINE NEEDLE ASPIRATION:  - Malignant cells consistent with adenocarcinoma     01/01/2022 Initial Diagnosis   Pancreatic cancer ([place])   01/13/2022 - 04/01/2022 Chemotherapy   Patient is on Treatment Plan : PANCREAS Gemcitabine D1,15 q28d x 4 Cycles     01/26/2022 Cancer Staging   Staging form: Exocrine Pancreas, AJCC 8th Edition - Clinical: Stage IB (cT2, cN0, cM0) - Signed by Truitt Merle, MD on 01/26/2022 Total positive nodes: 0    Genetic Testing   [facility] CancerNext-Expanded Panel was Negative. Of note, a variant of uncertain significance was identified in the BLM gene (p.[id number]) and GALNT12 gene ([id number]). Report date is 01/26/2022.  The CancerNext-Expanded gene panel offered by [facility] and includes sequencing, rearrangement, and RNA analysis for the following 77 genes: AIP, ALK, APC, ATM, AXIN2, BAP1, BARD1, BLM, BMPR1A, BRCA1, BRCA2,  BRIP1, CDC73, CDH1, CDK4, CDKN1B, CDKN2A, CHEK2, CTNNA1, DICER1, FANCC, FH, FLCN, GALNT12, KIF1B, LZTR1, MAX, MEN1, MET, MLH1, MSH2, MSH3, MSH6, MUTYH, NBN, NF1, NF2, NTHL1, PALB2, PHOX2B, PMS2, POT1, PRKAR1A, PTCH1, PTEN, RAD51C, RAD51D, RB1, RECQL, RET, SDHA, SDHAF2, SDHB, SDHC, SDHD, SMAD4, SMARCA4, SMARCB1, SMARCE1, STK11, SUFU, TMEM127, TP53, TSC1, TSC2, VHL and XRCC2 (sequencing and deletion/duplication); EGFR, EGLN1, HOXB13, KIT, MITF, PDGFRA, POLD1, and POLE (sequencing only); EPCAM and GREM1 (deletion/duplication only).    08/12/2022 Imaging    IMPRESSION: 1. Slight interval increase in size of the pancreatic head-uncinate process mass with increasing direct contact of the SMA and portal vein as discussed. Also with a replaced RIGHT hepatic artery which passes through the tumor. 2. No signs of acute inflammation currently about the pancreas. With similar appearance of ductal obstruction and peripheral atrophy of pancreas due to the tumor. 3. Post LEFT nephrectomy. 4. Sclerotic bony lesions of the LEFT ischium and LEFT iliac bone similar to prior imaging. 5. Mild hyperenhancement of LEFT prostate 14 mm with asymmetry, nonspecific. This is unchanged in this patient with history of prostate neoplasm. 6. Aortic atherosclerosis.      INTERVAL HISTORY:  EFTHIMIOS THANE is here for a follow up of pancreatic cancer. He was last seen by NP Lacie on 10/13/2022. He presents to the clinic alone and his daughter called in. He is overall doing well. He still has mild epigastric pain, for which he takes oxycodone as needed, average 1 tablet a day.  He lives in independent living, has home aides, is able to do all ADLs  and light activities.  He still drives.  No other new complaints.   All other systems were reviewed with the patient and are negative.  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)    Prostate cancer metastatic to bone ([place])    REM sleep behavior disorder    Sleep apnea    BiPap    SURGICAL HISTORY: Past Surgical History:  Procedure Laterality Date   APPENDECTOMY  1963   BIOPSY  12/25/2021   Procedure: BIOPSY;  Surgeon: Irving Copas., MD;  Location: [facility] ENDOSCOPY;  Service: Gastroenterology;;   COLONOSCOPY  2010, 2015   ESOPHAGOGASTRODUODENOSCOPY N/A 04/16/2022   Procedure: ESOPHAGOGASTRODUODENOSCOPY (EGD);  Surgeon: Irving Copas., MD;  Location: [place];  Service: Gastroenterology;  Laterality: N/A;   ESOPHAGOGASTRODUODENOSCOPY (EGD) WITH PROPOFOL N/A 12/25/2021   Procedure: ESOPHAGOGASTRODUODENOSCOPY (EGD) WITH PROPOFOL;  Surgeon: Rush Landmark Telford Nab., MD;  Location: [place];  Service: Gastroenterology;  Laterality: N/A;   EUS N/A 12/25/2021   Procedure: UPPER ENDOSCOPIC ULTRASOUND (EUS) RADIAL;  Surgeon: Irving Copas., MD;  Location: [place];  Service: Gastroenterology;  Laterality: N/A;   EUS N/A 04/16/2022   Procedure: UPPER ENDOSCOPIC ULTRASOUND (EUS) RADIAL;  Surgeon: Irving Copas., MD;  Location: [place];  Service: Gastroenterology;  Laterality: N/A;   FIDUCIAL MARKER PLACEMENT N/A 04/16/2022   Procedure: FIDUCIAL MARKER PLACEMENT;  Surgeon: Irving Copas., MD;  Location: [place];  Service: Gastroenterology;  Laterality: N/A;   FINE NEEDLE ASPIRATION  12/25/2021   Procedure: FINE NEEDLE ASPIRATION (FNA) LINEAR;  Surgeon: Irving Copas., MD;  Location: [facility] ENDOSCOPY;  Service: Gastroenterology;;   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   KYPHOPLASTY N/A 08/15/2020   Procedure: L2 compression fracture;  Surgeon: Hessie Knows, MD;  Location: [facility] ORS;  Service: Orthopedics;  Laterality: N/A;   KYPHOPLASTY N/A 08/29/2020   Procedure: T8  KYPHOPLASTY;  Surgeon: Hessie Knows, MD;  Location: [facility] ORS;  Service: Orthopedics;  Laterality: N/A;   KYPHOPLASTY N/A 11/12/2020   Procedure: L1 KYPHOPLASTY;  Surgeon: Hessie Knows, MD;  Location: [facility] ORS;  Service: Orthopedics;  Laterality: N/A;   PORTACATH PLACEMENT N/A 02/05/2022   Procedure: INSERTION PORT-A-CATH;  Surgeon: Dwan Bolt, MD;  Location: [facility] ORS;  Service: General;  Laterality: N/A;    I have reviewed the social history and family history with the patient and they are unchanged from previous note.  ALLERGIES:  is allergic to influenza vaccines.  MEDICATIONS:  Current Outpatient Medications  Medication Sig Dispense Refill   acetaminophen (TYLENOL) 500 MG tablet Take 1 tablet (500 mg total) by mouth every 6 (six) hours as needed for moderate pain or mild pain. 30 tablet 0   apixaban (ELIQUIS) 5 MG TABS tablet Take 1 tablet (5 mg total) by mouth 2 (two) times daily. 120 tablet 3   carbidopa-levodopa (SINEMET CR) 50-200 MG tablet TAKE 1 TABLET AT BEDTIME 90 tablet 0   carbidopa-levodopa (SINEMET IR) 25-100 MG tablet 3 tablets at 7 AM/2 at 11 AM/ 2 tablets at 3 PM/ 1 tablet at 4 pm / 2 tablet at 7 PM 900 tablet 0   clonazePAM (KLONOPIN) 0.5 MG tablet Take 0.5 mg by mouth at bedtime.     cyanocobalamin (VITAMIN B12)  1000 MCG tablet Take 1,000 mcg by mouth daily.     diltiazem (CARDIZEM) 30 MG tablet TAKE 1 TABLET TWICE A DAY 180 tablet 3   Glycerin-Hypromellose-PEG 400 (VISINE DRY EYE OP) Place 1 drop into both eyes 2 (two) times daily as needed (eye irritation).      ketoconazole (NIZORAL) 2 % shampoo Apply 1 application  topically 3 (three) times a week.     leuprolide, 6 Month, (ELIGARD) 45 MG injection Inject 45 mg into the skin every 6 (six) months.     omeprazole (PRILOSEC) 20 MG capsule TAKE 1 CAPSULE DAILY 90 capsule 3   ondansetron (ZOFRAN) 8 MG tablet Take 1 tablet (8 mg total) by mouth 2 (two) times daily as needed (Nausea or vomiting). (Patient taking  differently: Take 8 mg by mouth 2 (two) times daily as needed for vomiting or nausea.) 30 tablet 1   oxyCODONE (OXY IR/ROXICODONE) 5 MG immediate release tablet Take 0.5-1 tablets (2.5-5 mg total) by mouth every 12 (twelve) hours as needed for severe pain. 30 tablet 0   Pancrelipase, Lip-Prot-Amyl, (PANCREAZE) 37000-97300 units CPEP Take 2 capsules (74,000 Units total) by mouth with breakfast, with lunch, and with evening meal. Take 2 capsules with each meal and 1 with snack 600 capsule 1   polyethylene glycol powder (MIRALAX) 17 GM/SCOOP powder Take 17 g by mouth in the morning and at bedtime.     prochlorperazine (COMPAZINE) 10 MG tablet Take 1 tablet (10 mg total) by mouth every 6 (six) hours as needed (Nausea or vomiting). (Patient taking differently: Take 10 mg by mouth every 6 (six) hours as needed for vomiting or nausea.) 30 tablet 1   rosuvastatin (CRESTOR) 10 MG tablet TAKE 1 TABLET DAILY 90 tablet 3   triamcinolone cream (KENALOG) 0.1 % Apply 1 application topically 2 (two) times daily. (Patient taking differently: Apply 1 application  topically 2 (two) times daily as needed (itching).) 453.6 g 1   zoledronic acid (RECLAST) 5 MG/100ML SOLN injection Inject 5 mg into the vein See admin instructions. Once a year     No current facility-administered medications for this visit.    PHYSICAL EXAMINATION: ECOG PERFORMANCE STATUS: 2 - Symptomatic, <50% confined to bed  Vitals:   11/13/22 1246  BP: 103/61  Pulse: (!) 57  Resp: 16  Temp: 97.8 F (36.6 C)  SpO2: 98%   Wt Readings from Last 3 Encounters:  11/13/22 152 lb 11.2 oz (69.3 kg)  10/26/22 153 lb 6.4 oz (69.6 kg)  10/13/22 151 lb 4.8 oz (68.6 kg)     GENERAL:alert, no distress and comfortable SKIN: skin color, texture, turgor are normal, no rashes or significant lesions EYES: normal, Conjunctiva are pink and non-injected, sclera clear NECK: supple, thyroid normal size, non-tender, without nodularity LYMPH:  no palpable  lymphadenopathy in the cervical, axillary  LUNGS: clear to auscultation and percussion with normal breathing effort HEART: regular rate & rhythm and no murmurs and no lower extremity edema ABDOMEN:abdomen soft, non-tender and normal bowel sounds Musculoskeletal:no cyanosis of digits and no clubbing  NEURO: alert & oriented x 3 with fluent speech, no focal motor/sensory deficits  LABORATORY DATA:  I have reviewed the data as listed    Latest Ref Rng & Units 11/13/2022   12:28 PM 10/25/2022    4:51 PM 10/13/2022   10:32 AM  CBC  WBC 4.0 - 10.5 K/uL 5.3  5.1  4.6   Hemoglobin 13.0 - 17.0 g/dL 12.8  13.4  13.3   Hematocrit  39.0 - 52.0 % 39.4  41.6  41.0   Platelets 150 - 400 K/uL 150  194  193         Latest Ref Rng & Units 11/13/2022   12:28 PM 10/25/2022    4:51 PM 10/13/2022   10:32 AM  CMP  Glucose 70 - 99 mg/dL 112  104  93   BUN 8 - 23 mg/dL '21  21  15   '$ Creatinine 0.61 - 1.24 mg/dL 1.13  1.25  1.09   Sodium 135 - 145 mmol/L 141  140  145   Potassium 3.5 - 5.1 mmol/L 4.5  4.1  4.5   Chloride 98 - 111 mmol/L 107  107  111   CO2 22 - 32 mmol/L '29  25  29   '$ Calcium 8.9 - 10.3 mg/dL 9.5  9.9  10.1   Total Protein 6.5 - 8.1 g/dL 6.7   6.8   Total Bilirubin 0.3 - 1.2 mg/dL 0.7   1.2   Alkaline Phos 38 - 126 U/L 52   57   AST 15 - 41 U/L 22   25   ALT 0 - 44 U/L 3   10       RADIOGRAPHIC STUDIES: I have personally reviewed the radiological images as listed and agreed with the findings in the report. No results found.    Orders Placed This Encounter  Procedures   Prostate-Specific AG, Serum    Standing Status:   Standing    Number of Occurrences:   20    Standing Expiration Date:   11/13/2023   All questions were answered. The patient knows to call the clinic with any problems, questions or concerns. No barriers to learning was detected. The total time spent in the appointment was 30 minutes.     Truitt Merle, MD 11/13/2022

## 2022-11-15 LAB — CANCER ANTIGEN 19-9: CA 19-9: 282 U/mL — ABNORMAL HIGH (ref 0–35)

## 2022-11-15 LAB — PROSTATE-SPECIFIC AG, SERUM (LABCORP): Prostate Specific Ag, Serum: <0.1 ng/mL (ref 0.0–4.0)

## 2022-11-16 ENCOUNTER — Encounter: Payer: Self-pay | Admitting: Hematology

## 2022-11-17 ENCOUNTER — Ambulatory Visit: Payer: Medicare Other | Admitting: Cardiology

## 2022-11-17 DIAGNOSIS — M25551 Pain in right hip: Secondary | ICD-10-CM | POA: Diagnosis not present

## 2022-11-17 DIAGNOSIS — M545 Low back pain, unspecified: Secondary | ICD-10-CM | POA: Diagnosis not present

## 2022-11-18 NOTE — Progress Notes (Unsigned)
Assessment/Plan:   1.  Parkinsons Disease  -continue carbidopa/levodopa 25/100, 3 tablets at 7 AM, 2 tablets at 11 AM, 3 tablets at 3 PM, 2 tablets at 7 PM  -Continue carbidopa/levodopa 50/200 CR at bedtime  -Patient has Inbrija and can use that as needed.  2.  RBD  -Receiving clonazepam, 0.5 mg, 2 tablets at night.  PDMP reviewed.  I am worried that he picked up 30 pills on July 24 from 1 prescriber and another 45 on August 6 from a different prescriber.  These are not prescribed by me, but dose is a bit worrisome.  All prescriptions, however, are coming out of [place].  [facility] is dispensing some of the prescriptions and [facility] some of them.  3.  OSAS  -On BiPAP and followed by [facility] neurology.  4.  Atrial flutter  -On Eliquis  -On diltiazem  5.  Low blood pressure  -Was better today, although he states it has been low at home.  Fairly asymptomatic.  Likely due to combination of diltiazem and Parkinson's disease.  Following with cardiology in [place] now.    6.  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    -Now in palliative care.  7.  Compression fractures.  -Has had several, with kyphoplasty at the L1 level.  Following with pain management.  Subjective:   [name] [name] was seen today in follow up for Parkinsons disease.  My previous records were reviewed prior to todays visit as well as outside records available to me.  Patient was given samples of Inbrija last visit and shown how to use that.  He reports that ***.  He did email me in February stating that he slightly increased his afternoon dose of levodopa.  I told him that was okay, but not to do that again in the future without letting me know.  He has been dealing with more issues with his pancreatic cancer and becoming more debilitated with more pain.  He is now on oxycodone and has started palliative care.  His follow-up CT March, 2024 showed slight decrease size  of pancreatic head mass and no evidence of metastatic disease.  He was in the emergency room in February with symptomatic atrial fibrillation.  Current prescribed movement disorder medications: carbidopa/levodopa 25/100, 3 tablets at 7 AM/2 at 11 AM/3 at 3 PM/2 tablet at 7 PM (increased) carbidopa/levodopa 50/200 CR at bed  Clonazepam 0.5 mg at bedtime  Inbrija (started last visit)     PREVIOUS MEDICATIONS: opicapone (took 2 weeks of samples and d/c)    ALLERGIES:   Allergies  Allergen Reactions   Influenza Vaccines Anaphylaxis    Flu shot: 1974 anaphylaxis. 2nd time swollen arm    CURRENT MEDICATIONS:  Outpatient Encounter Medications as of 11/19/2022  Medication Sig   acetaminophen (TYLENOL) 500 MG tablet Take 1 tablet (500 mg total) by mouth every 6 (six) hours as needed for moderate pain or mild pain.   apixaban (ELIQUIS) 5 MG TABS tablet Take 1 tablet (5 mg total) by mouth 2 (two) times daily.   carbidopa-levodopa (SINEMET CR) 50-200 MG tablet TAKE 1 TABLET AT BEDTIME   carbidopa-levodopa (SINEMET IR) 25-100 MG tablet 3 tablets at 7 AM/2 at 11 AM/ 2 tablets at 3 PM/ 1 tablet at 4 pm / 2 tablet at 7 PM   clonazePAM (KLONOPIN) 0.5 MG tablet Take 0.5 mg by mouth at bedtime.   cyanocobalamin (VITAMIN B12) 1000 MCG tablet Take 1,000  mcg by mouth daily.   diltiazem (CARDIZEM) 30 MG tablet TAKE 1 TABLET TWICE A DAY   Glycerin-Hypromellose-PEG 400 (VISINE DRY EYE OP) Place 1 drop into both eyes 2 (two) times daily as needed (eye irritation).    ketoconazole (NIZORAL) 2 % shampoo Apply 1 application  topically 3 (three) times a week.   leuprolide, 6 Month, (ELIGARD) 45 MG injection Inject 45 mg into the skin every 6 (six) months.   omeprazole (PRILOSEC) 20 MG capsule TAKE 1 CAPSULE DAILY   ondansetron (ZOFRAN) 8 MG tablet Take 1 tablet (8 mg total) by mouth 2 (two) times daily as needed (Nausea or vomiting). (Patient taking differently: Take 8 mg by mouth 2 (two) times daily as needed for  vomiting or nausea.)   oxyCODONE (OXY IR/ROXICODONE) 5 MG immediate release tablet Take 0.5-1 tablets (2.5-5 mg total) by mouth every 12 (twelve) hours as needed for severe pain.   Pancrelipase, Lip-Prot-Amyl, (PANCREAZE) 37000-97300 units CPEP Take 2 capsules (74,000 Units total) by mouth with breakfast, with lunch, and with evening meal. Take 2 capsules with each meal and 1 with snack   polyethylene glycol powder (MIRALAX) 17 GM/SCOOP powder Take 17 g by mouth in the morning and at bedtime.   prochlorperazine (COMPAZINE) 10 MG tablet Take 1 tablet (10 mg total) by mouth every 6 (six) hours as needed (Nausea or vomiting). (Patient taking differently: Take 10 mg by mouth every 6 (six) hours as needed for vomiting or nausea.)   rosuvastatin (CRESTOR) 10 MG tablet TAKE 1 TABLET DAILY   triamcinolone cream (KENALOG) 0.1 % Apply 1 application topically 2 (two) times daily. (Patient taking differently: Apply 1 application  topically 2 (two) times daily as needed (itching).)   zoledronic acid (RECLAST) 5 MG/100ML SOLN injection Inject 5 mg into the vein See admin instructions. Once a year   No facility-administered encounter medications on file as of 11/19/2022.    Objective:   PHYSICAL EXAMINATION:    VITALS:   There were no vitals filed for this visit.      GEN:  The patient appears stated age and is in NAD. HEENT:  Normocephalic, atraumatic.  The mucous membranes are moist. The superficial temporal arteries are without ropiness or tenderness.   Neurological examination:  Orientation: The patient is alert and oriented x3. Cranial nerves: There is good facial symmetry with facial hypomimia. The speech is fluent and clear. Soft palate rises symmetrically and there is no tongue deviation. Hearing is intact to conversational tone. Sensation: Sensation is intact to light touch throughout Motor: Strength is at least antigravity x4.  Movement examination: Tone: There is nl tone in the  UE/LE Abnormal movements: no dyskinesia today Coordination:  There is mild decremation with RAM's, with any form of RAMS, including alternating supination and pronation of the forearm, hand opening and closing, finger taps, heel taps and toe taps, Gait and Station: Pushes off to arise.  Ambulates with a walker.  I have reviewed and interpreted the following labs independently    Chemistry      Component Value Date/Time   NA 141 11/13/2022 1228   NA 139 12/04/2021 1359   K 4.5 11/13/2022 1228   CL 107 11/13/2022 1228   CO2 29 11/13/2022 1228   BUN 21 11/13/2022 1228   BUN 20 12/04/2021 1359   CREATININE 1.13 11/13/2022 1228      Component Value Date/Time   CALCIUM 9.5 11/13/2022 1228   ALKPHOS 52 11/13/2022 1228   AST 22 11/13/2022 1228  ALT 3 11/13/2022 1228   BILITOT 0.7 11/13/2022 1228       Lab Results  Component Value Date   WBC 5.3 11/13/2022   HGB 12.8 (L) 11/13/2022   HCT 39.4 11/13/2022   MCV 93.8 11/13/2022   PLT 150 11/13/2022    Lab Results  Component Value Date   TSH 1.217 08/16/2019    Total time spent on today's visit was *** minutes, including both face-to-face time and nonface-to-face time.  Time included that spent on review of records (prior notes available to me/labs/imaging if pertinent), discussing treatment and goals, answering patient's questions and coordinating care.   Cc:  [name], MD

## 2022-11-19 ENCOUNTER — Ambulatory Visit (INDEPENDENT_AMBULATORY_CARE_PROVIDER_SITE_OTHER): Payer: Medicare Other | Admitting: Neurology

## 2022-11-19 ENCOUNTER — Encounter: Payer: Self-pay | Admitting: Neurology

## 2022-11-19 VITALS — BP 118/76 | HR 67 | Resp 20 | Ht 65.0 in | Wt 152.0 lb

## 2022-11-19 DIAGNOSIS — G20B2 Parkinson's disease with dyskinesia, with fluctuations: Secondary | ICD-10-CM

## 2022-11-19 NOTE — Patient Instructions (Signed)
You are looking great!  Local and Online Resources for Power over Parkinson's Group  March 2024   LOCAL [place] PARKINSON'S GROUPS   Power over Parkinson's Group:    Power Over Parkinson's Patient Education Group will be Wednesday, March 13th-*Hybrid meting*- in person at [facility] location and via [facility], 2:00-3:00 pm.   Starting in November 2023, Power over [employer] and Care Partner Groups will meet together, with plans for separate break out session for caregivers (*this will be evolving over the next few months) Upcoming Power over Parkinson's Meetings/Care Partner Support:  2nd Wednesdays of the month at 2 pm:  March 13th, April 10th [place] at [email or web address] if interested in participating in this group    LOCAL EVENTS AND NEW OFFERINGS  Let's Try Pickleball-$25 for 6 weeks of Pickleball, starting February 2nd.  Contact Corwin Levins for more details.  [email or web address] NEW:  Parkinson's Social Game Night.  First Thursday of each month, 2:00-4:00 pm.  *Next date is MARCH 7th*.  [place], [employer].  Contact [email or web address] if interested. Parkinson's CarePartner Group for Men is in the works, if interested email [email or web address] ACT FITNESS Chair Yoga classes "Train and Gain", Fridays 10 am, [employer].  Contact Gina at [phone].  PWR! Moves [employer] Instructor-Led Classes offering at [employer]!  TUESDAYS and Wednesdays 1-2 pm.   Contact Vonna Kotyk at  [email or web address]  or [phone] (Tuesday classes are modified for chair and standing only) Drumming for Parkinson's will be held on 2nd and 4th Mondays at 11:00 am.   Located at the [place] ([place]  Contact Doylene Canning at [email or web address] or [phone]  Dance for Parkinson 's classes will be on Tuesdays 10-11 am starting in  February. Located in the [employer] , in the first floor of the [employer] ([place] To register:  [email or web address] or [phone] [facility] Class, Mondays at 11 am.  Call [phone] for details [place].  Saturday, May 4th, 10 am start.  Register at [employer]:  [email or web address]  PD Health at Home continues:  Mindfulness Mondays, Wellness Wednesdays, Fitness Fridays  (PWR! Moves as part of Fitness Fridays March 22nd, 1-1:45 pm) Upcoming Education:   Managing "Off" Periods:  Return of Parkinson's Symptoms.  Wednesday, March 20th, 1-2 pm Parkinson's 101.  Wednesday, April 3rd, 1-2 pm Expert Briefing:  Understanding Pain in Parkinson's.   Wednesday, March 13th, 1-2 pm  Research Update:  Working to [employer] PD.  Wednesday, April 10th, 1-2 pm Register for virtual education and Patent attorney (webinars) at [email or web address] Please check out their website to sign up for emails and see their full online offerings     [place]:  [email or web address]   Third Thursday Webinars:  On the third Thursday of every month at 12 p.m. ET, join our free live webinars to learn about various aspects of living with Parkinson's disease and our work to speed medical breakthroughs.  Upcoming Webinar:  Everyday Exposure to Parkinson's:  Environmental Connections to the Disease.  Thursday, March 21st at 12 noon. Check out additional information on their website to see their full online offerings    [facility]:  [email or web address]  Upcoming Webinar:   Nutrition and Parkinson's.  Wednesday, March 6th, 12 noon Webinar Series:  Living with Parkinson's Meetup.   Third Thursdays each month, 3  pm  Care Partner Monthly Meetup.  With Robin Searing Phinney.  First Tuesday of each month, 2 pm  Check  out additional information to Live Well Today on their website    Parkinson and Movement Disorders (PMD) Alliance:  www.[email or web address]  [employer]:  Online Education Events  Sign up for emails, which are sent weekly to give you updates on programming and online offerings    Parkinson's Association of [place]:  www.[email or web address]  Information on online support groups, education events, and online exercises including Yoga, Parkinson's exercises and more-LOTS of information on links to PD resources and online events  Virtual Support Group through Parkinson's Association of the [place]; next one is scheduled for Wednesday, March 6th  MOVEMENT AND EXERCISE OPPORTUNITIES  PWR! Moves Classes at [place].  Wednesdays 10 and 11 am.   Contact Amy Marriott, PT [email or web address] if interested.  PWR! Moves Class offerings at [employer]. *TUESDAYS* and Wednesdays 1-2 pm.    Contact Vonna Kotyk at  [email or web address]    Parkinson's Wellness Recovery (PWR! Moves)  [email or web address]  Info on the PWR! Virtual Experience:  You will have access to our expertise?through self-assessment, guided plans that start with the PD-specific fundamentals, educational content, tips, Q&A with an expert, and a growing Art therapist of PD-specific pre-recorded and live exercise classes of varying types and intensity - both physical and cognitive! If that is not enough, we offer 1:1 wellness consultations (in-person or virtual) to personalize your PWR! Research scientist (medical).   [place] Fridays:   As part of the PD Health @ Home program, this free video series focuses each week on one aspect of fitness designed to support people living with Parkinson's.? These weekly videos highlight the [place] fitness guidelines for people with Parkinson's disease.  ModemGamers.[email or web address]  Dance for PD website is  offering free, live-stream classes throughout the week, as well as links to [employer] of classes:  [email or web address]  Virtual dance and Pilates for Parkinson's classes: Click on the Community Tab> Parkinson's Movement Initiative Tab.  To register for classes and for more information, visit [email or web address] and click the "community" tab.   [employer] Parkinson's Cycling Classes   [employer]:  Thursdays @ Noon-Live classes at [employer] ([employer] at [email or web address]?or [phone])  [employer]: Virtual Classes Mondays and Thursdays Jeanette Caprice classes Tuesday, Wednesday and Thursday (contact [place] at [email or web address] ?or [phone])  [place]  Varied levels of classes are offered Tuesdays and Thursdays at [employer].   Stretching with Verdis Frederickson weekly class is also offered for people with Parkinson's  To observe a class or for more information, call [phone] or email Hezzie Bump at [email or web address]   ADDITIONAL SUPPORT AND RESOURCES  [employer]:Online Caregiver Education Opportunities:  www.well-[email or web address]/caregiver-education/[email or web address]-support-group.  You may also contact Vickki Muff at [email or web address] or [phone].     Family Caregiver [phone].  Thursday, March 7th, 10:15-1:45 at [facility].  Register with [facility] (see above) Well-Spring Navigator:  Just1Navigator program, a?free service to help individuals and families through the journey of determining care for older adults.  The "Navigator" is a Vickki Muff, Education officer, museum, who will speak with a prospective client and/or loved ones to provide an assessment of the situation and a set of recommendations for a personalized care plan -- all free of charge, and whether?[employer] offers the needed service or not. If the need is not a service we provide, we are well-connected  with reputable programs in town that we can refer  you to.  www.well-[email or web address] or to speak with the Navigator, call [phone].

## 2022-11-20 ENCOUNTER — Ambulatory Visit: Payer: Medicare Other | Admitting: Cardiology

## 2022-11-20 ENCOUNTER — Encounter: Payer: Self-pay | Admitting: Cardiology

## 2022-11-20 VITALS — BP 96/63 | HR 57 | Ht 65.0 in | Wt 154.2 lb

## 2022-11-20 DIAGNOSIS — I4891 Unspecified atrial fibrillation: Secondary | ICD-10-CM

## 2022-11-20 DIAGNOSIS — E782 Mixed hyperlipidemia: Secondary | ICD-10-CM | POA: Diagnosis not present

## 2022-11-20 DIAGNOSIS — I700 Atherosclerosis of aorta: Secondary | ICD-10-CM

## 2022-11-20 DIAGNOSIS — G4733 Obstructive sleep apnea (adult) (pediatric): Secondary | ICD-10-CM

## 2022-11-20 DIAGNOSIS — C259 Malignant neoplasm of pancreas, unspecified: Secondary | ICD-10-CM

## 2022-11-20 DIAGNOSIS — I4510 Unspecified right bundle-branch block: Secondary | ICD-10-CM | POA: Diagnosis not present

## 2022-11-20 DIAGNOSIS — Z7901 Long term (current) use of anticoagulants: Secondary | ICD-10-CM | POA: Diagnosis not present

## 2022-11-20 DIAGNOSIS — C61 Malignant neoplasm of prostate: Secondary | ICD-10-CM | POA: Diagnosis not present

## 2022-11-20 DIAGNOSIS — I4892 Unspecified atrial flutter: Secondary | ICD-10-CM | POA: Diagnosis not present

## 2022-11-20 NOTE — Progress Notes (Signed)
Date:  11/20/2022   ID:  [name] [name], DOB 1941-06-11, MRN [id number]  PCP:  [name], MD  Cardiologist:  Rex Kras, DO, [facility] (established care 04/10/2021) Former Cardiologist: Isaias Cowman, MD  Date: 11/20/22 Last Office Visit: 10/26/2022  Chief Complaint  Patient presents with   Atrial Fibrillation   Follow-up    2 weeks    HPI  [name] [name] is a 82 y.o. 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  Patient is being followed by the practice for paroxysmal atrial flutter management.  However in February 2024 he had an ED visit for symptomatic atrial fibrillation.  At that time he had refused to undergo cardioversion for reasons unknown and follow-up with the practice the following week.  At the last office visit the shared decision was to proceed with transesophageal guided direct-current cardioversion to restore sinus rhythm.  However, on the morning of the procedure his surface ECG noted sinus rhythm.  Procedure was canceled.  He presents today for follow-up.  He denies anginal discomfort or heart failure symptoms.  Patient states that he is sinus rhythm.  Denies any near-syncope or syncopal event.  He is accompanied by her daughter [name] today's office visit who provides collateral history.  FUNCTIONAL STATUS: Walks about 0.5 miles per day.  ALLERGIES: Allergies  Allergen Reactions   Influenza Vaccines Anaphylaxis    Flu shot: 1974 anaphylaxis. 2nd time swollen arm   Influenza Virus Vaccine Other (See Comments)    MEDICATION LIST PRIOR TO VISIT: Current Meds  Medication Sig   acetaminophen (TYLENOL) 500 MG tablet Take 1 tablet (500 mg total) by mouth every 6 (six) hours as  needed for moderate pain or mild pain.   apixaban (ELIQUIS) 5 MG TABS tablet Take 1 tablet (5 mg total) by mouth 2 (two) times daily.   carbidopa-levodopa (SINEMET CR) 50-200 MG tablet TAKE 1 TABLET AT BEDTIME   carbidopa-levodopa (SINEMET IR) 25-100 MG tablet 3 tablets at 7 AM/2 at 11 AM/ 2 tablets at 3 PM/ 1 tablet at 4 pm / 2 tablet at 7 PM   clonazePAM (KLONOPIN) 0.5 MG tablet Take 0.5 mg by mouth at bedtime.   cyanocobalamin (VITAMIN B12) 1000 MCG tablet Take 1,000 mcg by mouth daily.   diltiazem (CARDIZEM) 30 MG tablet TAKE 1 TABLET TWICE A DAY   Glycerin-Hypromellose-PEG 400 (VISINE DRY EYE OP) Place 1 drop into both eyes 2 (two) times daily as needed (eye irritation).    ketoconazole (NIZORAL) 2 % shampoo Apply 1 application  topically 3 (three) times a week.   leuprolide, 6 Month, (ELIGARD) 45 MG injection Inject 45 mg into the skin every 6 (six) months.   omeprazole (PRILOSEC) 20 MG capsule TAKE 1 CAPSULE DAILY   ondansetron (ZOFRAN) 8 MG tablet Take 1 tablet (8 mg total) by mouth 2 (two) times daily as needed (Nausea or vomiting). (Patient taking differently: Take 8 mg by mouth 2 (two) times daily as needed for vomiting or nausea.)   oxyCODONE (OXY IR/ROXICODONE) 5 MG immediate release tablet Take 0.5-1 tablets (2.5-5 mg total) by mouth every 12 (twelve) hours as needed for severe pain.   Pancrelipase, Lip-Prot-Amyl, (PANCREAZE) 37000-97300 units CPEP Take 2 capsules (74,000 Units total) by mouth with breakfast, with lunch, and with evening meal. Take 2  capsules with each meal and 1 with snack   polyethylene glycol powder (MIRALAX) 17 GM/SCOOP powder Take 17 g by mouth in the morning and at bedtime.   prochlorperazine (COMPAZINE) 10 MG tablet Take 1 tablet (10 mg total) by mouth every 6 (six) hours as needed (Nausea or vomiting). (Patient taking differently: Take 10 mg by mouth every 6 (six) hours as needed for vomiting or nausea.)   rosuvastatin (CRESTOR) 10 MG tablet TAKE 1 TABLET DAILY    triamcinolone cream (KENALOG) 0.1 % Apply 1 application topically 2 (two) times daily. (Patient taking differently: Apply 1 application  topically 2 (two) times daily as needed (itching).)   zoledronic acid (RECLAST) 5 MG/100ML SOLN injection Inject 5 mg into the vein See admin instructions. 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 ([place])    Prostate cancer metastatic to bone ([place])    REM sleep behavior disorder    Sleep apnea    BiPap    PAST SURGICAL HISTORY: Past Surgical History:  Procedure Laterality Date   APPENDECTOMY  1963   BIOPSY  12/25/2021   Procedure: BIOPSY;  Surgeon: Irving Copas., MD;  Location: [facility] ENDOSCOPY;  Service: Gastroenterology;;   COLONOSCOPY  2010, 2015   ESOPHAGOGASTRODUODENOSCOPY N/A 04/16/2022   Procedure: ESOPHAGOGASTRODUODENOSCOPY (EGD);  Surgeon: Irving Copas., MD;  Location: [place];  Service: Gastroenterology;  Laterality: N/A;   ESOPHAGOGASTRODUODENOSCOPY (EGD) WITH PROPOFOL N/A 12/25/2021   Procedure: ESOPHAGOGASTRODUODENOSCOPY (EGD) WITH PROPOFOL;  Surgeon: Rush Landmark Telford Nab., MD;  Location: [place];  Service: Gastroenterology;  Laterality: N/A;   EUS N/A 12/25/2021   Procedure: UPPER ENDOSCOPIC ULTRASOUND (EUS) RADIAL;  Surgeon: Irving Copas., MD;  Location: [place];  Service: Gastroenterology;  Laterality: N/A;   EUS N/A 04/16/2022   Procedure: UPPER ENDOSCOPIC ULTRASOUND (EUS) RADIAL;  Surgeon: Irving Copas., MD;  Location: [place];  Service: Gastroenterology;  Laterality: N/A;   FIDUCIAL MARKER PLACEMENT N/A 04/16/2022   Procedure: FIDUCIAL MARKER PLACEMENT;  Surgeon: Irving Copas., MD;  Location: [place];   Service: Gastroenterology;  Laterality: N/A;   FINE NEEDLE ASPIRATION  12/25/2021   Procedure: FINE NEEDLE ASPIRATION (FNA) LINEAR;  Surgeon: Irving Copas., MD;  Location: [facility] ENDOSCOPY;  Service: Gastroenterology;;   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   KYPHOPLASTY N/A 08/15/2020   Procedure: L2 compression fracture;  Surgeon: Hessie Knows, MD;  Location: [facility]S;  Service: Orthopedics;  Laterality: N/A;   KYPHOPLASTY N/A 08/29/2020   Procedure: T8 KYPHOPLASTY;  Surgeon: Hessie Knows, MD;  Location: [facility] ORS;  Service: Orthopedics;  Laterality: N/A;   KYPHOPLASTY N/A 11/12/2020   Procedure: L1 KYPHOPLASTY;  Surgeon: Hessie Knows, MD;  Location: [facility] ORS;  Service: Orthopedics;  Laterality: N/A;   PORTACATH PLACEMENT N/A 02/05/2022   Procedure: INSERTION PORT-A-CATH;  Surgeon: Dwan Bolt, MD;  Location: [facility];  Service: General;  Laterality: N/A;    FAMILY HISTORY: The patient family history includes Breast cancer in his daughter; Diabetes in his paternal grandfather; Heart disease in his maternal grandfather.  SOCIAL HISTORY:  The patient  reports that he quit smoking about 12 years ago. His smoking use included cigars. He has never used smokeless tobacco. He reports  current alcohol use. He reports that he does not use drugs.  REVIEW OF SYSTEMS: Review of Systems  Constitutional: Negative for malaise/fatigue.  Cardiovascular:  Negative for chest pain, claudication, dyspnea on exertion, irregular heartbeat, leg swelling, near-syncope, orthopnea, palpitations, paroxysmal nocturnal dyspnea and syncope.  Respiratory:  Negative for shortness of breath.   Hematologic/Lymphatic: Negative for bleeding problem.  Musculoskeletal:  Negative for muscle cramps and myalgias.  Neurological:  Negative for dizziness and light-headedness.    PHYSICAL EXAM:    11/20/2022    2:33 PM 11/19/2022    2:32 PM 11/13/2022   12:46 PM  Vitals with BMI  Height 5\' 5"  5\' 5"  5\' 4"   Weight 154 lbs 3 oz 152  lbs 152 lbs 11 oz  BMI 25.66 [id number]  Systolic 96 123456 [id number]  Diastolic 63 76 61  Pulse 57 67 57   Physical Exam  Constitutional: No distress. He appears chronically ill.   hemodynamically stable, ambulates w/ walker  Neck: No JVD present.  Cardiovascular: Normal rate, S1 normal, S2 normal, intact distal pulses and normal pulses. An irregularly irregular rhythm present. Exam reveals no gallop, no S3 and no S4.  Murmur heard. Holosystolic murmur is present with a grade of 3/6 at the apex. Pulses:      Dorsalis pedis pulses are 2+ on the right side and 2+ on the left side.       Posterior tibial pulses are 2+ on the right side and 2+ on the left side.  Pulmonary/Chest: Effort normal and breath sounds normal. No stridor. He has no wheezes. He has no rales.  Abdominal: Soft. Bowel sounds are normal. He exhibits no distension. There is no abdominal tenderness.  Musculoskeletal:        General: No edema.     Cervical back: Neck supple.  Neurological: He is alert and oriented to person, place, and time. He has intact cranial nerves (2-12).  Skin: Skin is warm and moist.    CARDIAC DATABASE: EKG: 10/26/2022: Atrial fibrillation, 88 bpm, RBBB, left axis.   11/20/2022: Sinus bradycardia, 52 bpm, compared to 10/26/2022 atrial fibrillation has now transition to sinus.  Echocardiogram: 04/30/2021: Normal LV systolic function with EF 63%. Moderate concentric hypertrophy of the left ventricle. Normal global wall motion. Left ventricle cavity is normal in size.  Doppler evidence of grade II (pseudonormal) diastolic dysfunction, elevated LAP.  Left atrial cavity is moderately dilated. Trileaflet aortic valve. Moderate (Grade II) aortic regurgitation. Mild to moderate mitral regurgitation. Mild tricuspid regurgitation.  No evidence of pulmonary hypertension.   Stress Testing: Lexiscan Tetrofosmin stress test 04/30/2021: 1 Day Rest/Stress Protocol. Stress EKG is non-diagnostic for ischemia as its  a pharmacologic stress test using Lexiscan. Normal myocardial perfusion without convincing evidence of reversible myocardial ischemia or prior infarct.   Left ventricular ejection fraction is 55% with normal wall motion.   Low risk study. No prior studies for comparison.   Heart Catheterization: None  LABORATORY DATA:    Latest Ref Rng & Units 11/13/2022   12:28 PM 10/25/2022    4:51 PM 10/13/2022   10:32 AM  CBC  WBC 4.0 - 10.5 K/uL 5.3  5.1  4.6   Hemoglobin 13.0 - 17.0 g/dL 12.8  13.4  13.3   Hematocrit 39.0 - 52.0 % 39.4  41.6  41.0   Platelets 150 - 400 K/uL 150  194  193        Latest Ref Rng & Units 11/13/2022   12:28 PM 10/25/2022  4:51 PM 10/13/2022   10:32 AM  CMP  Glucose 70 - 99 mg/dL 112  104  93   BUN 8 - 23 mg/dL 21  21  15    Creatinine 0.61 - 1.24 mg/dL 1.13  1.25  1.09   Sodium 135 - 145 mmol/L 141  140  145   Potassium 3.5 - 5.1 mmol/L 4.5  4.1  4.5   Chloride 98 - 111 mmol/L 107  107  111   CO2 22 - 32 mmol/L 29  25  29    Calcium 8.9 - 10.3 mg/dL 9.5  9.9  10.1   Total Protein 6.5 - 8.1 g/dL 6.7   6.8   Total Bilirubin 0.3 - 1.2 mg/dL 0.7   1.2   Alkaline Phos 38 - 126 U/L 52   57   AST 15 - 41 U/L 22   25   ALT 0 - 44 U/L 3   10     Lipid Panel     Component Value Date/Time   CHOL 124 12/04/2021 1359   TRIG 146 12/04/2021 1359   HDL 66 12/04/2021 1359   CHOLHDL 3.0 08/17/2019 0445   VLDL 20 08/17/2019 0445   LDLCALC 34 12/04/2021 1359   LDLDIRECT 43 12/04/2021 1359   LABVLDL 24 12/04/2021 1359    No components found for: "NTPROBNP" No results for input(s): "PROBNP" in the last 8760 hours. No results for input(s): "TSH" in the last 8760 hours.  BMP Recent Labs    10/13/22 1032 10/25/22 1651 11/13/22 1228  NA 145 140 141  K 4.5 4.1 4.5  CL 111 107 107  CO2 29 25 29   GLUCOSE 93 104* 112*  BUN 15 21 21   CREATININE 1.09 1.25* 1.13  CALCIUM 10.1 9.9 9.5  GFRNONAA >60 58* >60    HEMOGLOBIN A1C No results found for: "HGBA1C",  "MPG"  IMPRESSION:    ICD-10-CM   1. Atrial fibrillation and flutter (HCC)  I48.91 EKG 12-Lead   I48.92     2. Long term (current) use of anticoagulants  Z79.01     3. Atherosclerosis of aorta (HCC)  I70.0     4. Mixed hyperlipidemia  E78.2     5. OSA treated with BiPAP  G47.33     6. 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 Prostate cancer ([place])  C61     8. Malignant neoplasm of pancreas, unspecified location of malignancy ([place])  C25.9        RECOMMENDATIONS: BEATRICE HEINZEL is a 82 y.o. 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  Paroxysmal atrial flutter/fibrillation History of paroxysmal atrial flutter. Rate control: Diltiazem. Rhythm control: N/A. Thromboembolic prophylaxis: Eliquis CHA2DS2-VASc SCORE is 3 which correlates to 3% risk of stroke per year (age, atherosclerosis).  Diagnosed in 2020. Due to soft pressures I have asked him to hold diltiazem if SBP less than 100 mmHg.  Long term (current) use of anticoagulants Does not endorse evidence of bleeding. Reemphasized the risks, benefits, and alternatives to anticoagulation. Will hold off on checking blood work as he has not done regularly every 2 months at the [facility].  Atherosclerosis of aorta (HCC) Chronic and stable. Currently on statin therapy.  Prostate cancer [facility]) Malignant neoplasm of pancreas, unspecified location of malignancy ([place]) Currently being  followed by other providers and care team  Discussed management of at least 2 chronic comorbid conditions, EKG and labs reviewed, medication changes/recommendations discussed.  FINAL MEDICATION LIST END OF ENCOUNTER: No orders of the defined types were placed in this encounter.  There are no discontinued  medications.   Current Outpatient Medications:    acetaminophen (TYLENOL) 500 MG tablet, Take 1 tablet (500 mg total) by mouth every 6 (six) hours as needed for moderate pain or mild pain., Disp: 30 tablet, Rfl: 0   apixaban (ELIQUIS) 5 MG TABS tablet, Take 1 tablet (5 mg total) by mouth 2 (two) times daily., Disp: 120 tablet, Rfl: 3   carbidopa-levodopa (SINEMET CR) 50-200 MG tablet, TAKE 1 TABLET AT BEDTIME, Disp: 90 tablet, Rfl: 0   carbidopa-levodopa (SINEMET IR) 25-100 MG tablet, 3 tablets at 7 AM/2 at 11 AM/ 2 tablets at 3 PM/ 1 tablet at 4 pm / 2 tablet at 7 PM, Disp: 900 tablet, Rfl: 0   clonazePAM (KLONOPIN) 0.5 MG tablet, Take 0.5 mg by mouth at bedtime., Disp: , Rfl:    cyanocobalamin (VITAMIN B12) 1000 MCG tablet, Take 1,000 mcg by mouth daily., Disp: , Rfl:    diltiazem (CARDIZEM) 30 MG tablet, TAKE 1 TABLET TWICE A DAY, Disp: 180 tablet, Rfl: 3   Glycerin-Hypromellose-PEG 400 (VISINE DRY EYE OP), Place 1 drop into both eyes 2 (two) times daily as needed (eye irritation). , Disp: , Rfl:    ketoconazole (NIZORAL) 2 % shampoo, Apply 1 application  topically 3 (three) times a week., Disp: , Rfl:    leuprolide, 6 Month, (ELIGARD) 45 MG injection, Inject 45 mg into the skin every 6 (six) months., Disp: , Rfl:    omeprazole (PRILOSEC) 20 MG capsule, TAKE 1 CAPSULE DAILY, Disp: 90 capsule, Rfl: 3   ondansetron (ZOFRAN) 8 MG tablet, Take 1 tablet (8 mg total) by mouth 2 (two) times daily as needed (Nausea or vomiting). (Patient taking differently: Take 8 mg by mouth 2 (two) times daily as needed for vomiting or nausea.), Disp: 30 tablet, Rfl: 1   oxyCODONE (OXY IR/ROXICODONE) 5 MG immediate release tablet, Take 0.5-1 tablets (2.5-5 mg total) by mouth every 12 (twelve) hours as needed for severe pain., Disp: 30 tablet, Rfl: 0   Pancrelipase, Lip-Prot-Amyl, (PANCREAZE) 37000-97300 units CPEP, Take 2 capsules (74,000 Units total) by mouth with breakfast, with lunch, and with evening meal. Take 2  capsules with each meal and 1 with snack, Disp: 600 capsule, Rfl: 1   polyethylene glycol powder (MIRALAX) 17 GM/SCOOP powder, Take 17 g by mouth in the morning and at bedtime., Disp: , Rfl:    prochlorperazine (COMPAZINE) 10 MG tablet, Take 1 tablet (10 mg total) by mouth every 6 (six) hours as needed (Nausea or vomiting). (Patient taking differently: Take 10 mg by mouth every 6 (six) hours as needed for vomiting or nausea.), Disp: 30 tablet, Rfl: 1   rosuvastatin (CRESTOR) 10 MG tablet, TAKE 1 TABLET DAILY, Disp: 90 tablet, Rfl: 3   triamcinolone cream (KENALOG) 0.1 %, Apply 1 application topically 2 (two) times daily. (Patient taking differently: Apply 1 application  topically 2 (two) times daily as needed (itching).), Disp: 453.6 g, Rfl: 1   zoledronic acid (RECLAST) 5 MG/100ML SOLN injection, Inject 5 mg into the vein See admin instructions. Once a year, Disp: , Rfl:   Orders Placed This Encounter  Procedures   EKG 12-Lead   There are no Patient Instructions on file for this visit.   --Continue  cardiac medications as reconciled in final medication list. --Return in about 6 months (around 05/23/2023) for Follow up, A. fib. Or sooner if needed. --Continue follow-up with your primary care physician regarding the management of your other chronic comorbid conditions.  Patient's questions and concerns were addressed to his satisfaction. He voices understanding of the instructions provided during this encounter.   This note was created using a voice recognition software as a result there may be grammatical errors inadvertently enclosed that do not reflect the nature of this encounter. Every attempt is made to correct such errors.  Rex Kras, [place], [facility]  Pager:  [phone] Office: [phone]

## 2022-11-24 DIAGNOSIS — M545 Low back pain, unspecified: Secondary | ICD-10-CM | POA: Diagnosis not present

## 2022-11-26 ENCOUNTER — Ambulatory Visit: Payer: Medicare Other | Admitting: Urology

## 2022-12-03 DIAGNOSIS — M792 Neuralgia and neuritis, unspecified: Secondary | ICD-10-CM | POA: Diagnosis not present

## 2022-12-03 DIAGNOSIS — L603 Nail dystrophy: Secondary | ICD-10-CM | POA: Diagnosis not present

## 2022-12-03 DIAGNOSIS — L851 Acquired keratosis [keratoderma] palmaris et plantaris: Secondary | ICD-10-CM | POA: Diagnosis not present

## 2022-12-03 DIAGNOSIS — B351 Tinea unguium: Secondary | ICD-10-CM | POA: Diagnosis not present

## 2022-12-03 DIAGNOSIS — I739 Peripheral vascular disease, unspecified: Secondary | ICD-10-CM | POA: Diagnosis not present

## 2022-12-08 DIAGNOSIS — M545 Low back pain, unspecified: Secondary | ICD-10-CM | POA: Diagnosis not present

## 2022-12-09 DIAGNOSIS — G4752 REM sleep behavior disorder: Secondary | ICD-10-CM | POA: Diagnosis not present

## 2022-12-09 DIAGNOSIS — G4733 Obstructive sleep apnea (adult) (pediatric): Secondary | ICD-10-CM | POA: Diagnosis not present

## 2022-12-09 DIAGNOSIS — G20B1 Parkinson's disease with dyskinesia, without mention of fluctuations: Secondary | ICD-10-CM | POA: Diagnosis not present

## 2022-12-11 DIAGNOSIS — M545 Low back pain, unspecified: Secondary | ICD-10-CM | POA: Diagnosis not present

## 2022-12-17 DIAGNOSIS — M545 Low back pain, unspecified: Secondary | ICD-10-CM | POA: Diagnosis not present

## 2022-12-22 ENCOUNTER — Other Ambulatory Visit: Payer: Self-pay

## 2022-12-22 ENCOUNTER — Other Ambulatory Visit (HOSPITAL_BASED_OUTPATIENT_CLINIC_OR_DEPARTMENT_OTHER): Payer: Self-pay

## 2022-12-22 ENCOUNTER — Encounter (HOSPITAL_BASED_OUTPATIENT_CLINIC_OR_DEPARTMENT_OTHER): Payer: Self-pay | Admitting: Emergency Medicine

## 2022-12-22 ENCOUNTER — Emergency Department (HOSPITAL_BASED_OUTPATIENT_CLINIC_OR_DEPARTMENT_OTHER)
Admission: EM | Admit: 2022-12-22 | Discharge: 2022-12-22 | Disposition: A | Discharge status: Home / Self Care | Payer: Medicare Other | Attending: Emergency Medicine | Admitting: Emergency Medicine

## 2022-12-22 ENCOUNTER — Telehealth: Payer: Self-pay

## 2022-12-22 ENCOUNTER — Emergency Department (HOSPITAL_BASED_OUTPATIENT_CLINIC_OR_DEPARTMENT_OTHER): Payer: Medicare Other

## 2022-12-22 DIAGNOSIS — Z8507 Personal history of malignant neoplasm of pancreas: Secondary | ICD-10-CM | POA: Diagnosis not present

## 2022-12-22 DIAGNOSIS — Z7901 Long term (current) use of anticoagulants: Secondary | ICD-10-CM | POA: Insufficient documentation

## 2022-12-22 DIAGNOSIS — R002 Palpitations: Secondary | ICD-10-CM | POA: Diagnosis not present

## 2022-12-22 DIAGNOSIS — I4892 Unspecified atrial flutter: Secondary | ICD-10-CM | POA: Insufficient documentation

## 2022-12-22 DIAGNOSIS — G20A1 Parkinson's disease without dyskinesia, without mention of fluctuations: Secondary | ICD-10-CM | POA: Diagnosis not present

## 2022-12-22 DIAGNOSIS — Z8546 Personal history of malignant neoplasm of prostate: Secondary | ICD-10-CM | POA: Diagnosis not present

## 2022-12-22 DIAGNOSIS — Z79899 Other long term (current) drug therapy: Secondary | ICD-10-CM | POA: Insufficient documentation

## 2022-12-22 LAB — CBC
HCT: 43.7 % (ref 39.0–52.0)
Hemoglobin: 13.9 g/dL (ref 13.0–17.0)
MCH: 29.6 pg (ref 26.0–34.0)
MCHC: 31.8 g/dL (ref 30.0–36.0)
MCV: 93 fL (ref 80.0–100.0)
Platelets: 220 10*3/uL (ref 150–400)
RBC: 4.7 MIL/uL (ref 4.22–5.81)
RDW: 13.4 % (ref 11.5–15.5)
WBC: 5.7 10*3/uL (ref 4.0–10.5)
nRBC: 0 % (ref 0.0–0.2)

## 2022-12-22 LAB — COMPREHENSIVE METABOLIC PANEL
ALT: <5 U/L (ref 0–44)
AST: 27 U/L (ref 15–41)
Albumin: 4.3 g/dL (ref 3.5–5.0)
Alkaline Phosphatase: 49 U/L (ref 38–126)
Anion gap: 9 (ref 5–15)
BUN: 20 mg/dL (ref 8–23)
CO2: 27 mmol/L (ref 22–32)
Calcium: 10 mg/dL (ref 8.9–10.3)
Chloride: 103 mmol/L (ref 98–111)
Creatinine, Ser: 1.27 mg/dL — ABNORMAL HIGH (ref 0.61–1.24)
GFR, Estimated: 57 mL/min — ABNORMAL LOW (ref >60)
Glucose, Bld: 95 mg/dL (ref 70–99)
Potassium: 4.1 mmol/L (ref 3.5–5.1)
Sodium: 139 mmol/L (ref 135–145)
Total Bilirubin: 1.1 mg/dL (ref 0.3–1.2)
Total Protein: 6.4 g/dL — ABNORMAL LOW (ref 6.5–8.1)

## 2022-12-22 NOTE — Discharge Instructions (Signed)
Please take your home medications Please call your cardiologist and let them know that you are back in atrial flutter.  They may consider giving you a monitor. Return if you are having any new symptoms such as chest pain, shortness of breath, or other problems

## 2022-12-22 NOTE — ED Triage Notes (Signed)
Pt POV from home ambulatory with walker in triage from [facility]. Pt states he was checking is pulse oximeter yesterday and his HR was showing to be irregular and high at times throughout the day (140 highest). Pt states "at times he felt that his heart was racing" but denies CP, SOB, weakness, N/V, or any other s/s yesterday or at present. Hx afib, no recent changes in meds.

## 2022-12-22 NOTE — ED Provider Notes (Addendum)
[facility] AT [facility] Provider Note   CSN: [id number] Arrival date & time: 12/22/22  0850     History  Chief Complaint  Patient presents with   Palpitations    [name] [name] is a 82 y.o. male.  HPI 82 year old male history of paroxysmal atrial flutter, on chronic anticoagulation, Parkinson's, prostate cancer, pancreatic cancer, hyper cholesterolemia presents today after noting irregular heartbeat on pulse ox.  He states he tests his pulse ox periodically and was not having symptoms.  He denies headache, head injury, chest pain, fever, chills, dyspnea, abdominal pain, nausea, or vomiting.  He states that he has not taken his Cardizem this morning.  He has been on Cardizem for his atrial flutter.  He had an episode where he was seen in the ED for atrial flutter and was rate controlled.  He was then scheduled for a cardioversion as an outpatient with his cardiologist.  When he arrived to have that done his heart rate was 47 he was in a sinus rhythm.  He has continued on his Cardizem and anticoagulants.    Home Medications Prior to Admission medications   Medication Sig Start Date End Date Taking? Authorizing Provider  acetaminophen (TYLENOL) 500 MG tablet Take 1 tablet (500 mg total) by mouth every 6 (six) hours as needed for moderate pain or mild pain. 04/18/22   Mansouraty, Netty Starring., MD  apixaban (ELIQUIS) 5 MG TABS tablet Take 1 tablet (5 mg total) by mouth 2 (two) times daily. 06/18/22   Tolia, Sunit, DO  carbidopa-levodopa (SINEMET CR) 50-200 MG tablet TAKE 1 TABLET AT BEDTIME 09/04/22   Tat, Rebecca S, DO  carbidopa-levodopa (SINEMET IR) 25-100 MG tablet 3 tablets at 7 AM/2 at 11 AM/ 2 tablets at 3 PM/ 1 tablet at 4 pm / 2 tablet at 7 PM 10/15/22   Tat, Lurena Joiner S, DO  clonazePAM (KLONOPIN) 0.5 MG tablet Take 0.5 mg by mouth at bedtime. 02/17/21   [provider]  cyanocobalamin (VITAMIN B12) 1000 MCG tablet Take 1,000 mcg by mouth daily.     [provider]  diltiazem (CARDIZEM) 30 MG tablet TAKE 1 TABLET TWICE A DAY 11/09/22   Tolia, Sunit, DO  Glycerin-Hypromellose-PEG 400 (VISINE DRY EYE OP) Place 1 drop into both eyes 2 (two) times daily as needed (eye irritation).     [provider]  ketoconazole (NIZORAL) 2 % shampoo Apply 1 application  topically 3 (three) times a week. 02/15/19   [provider]  leuprolide, 6 Month, (ELIGARD) 45 MG injection Inject 45 mg into the skin every 6 (six) months.    [provider]  omeprazole (PRILOSEC) 20 MG capsule TAKE 1 CAPSULE DAILY 09/09/22   Malachy Mood, MD  ondansetron (ZOFRAN) 8 MG tablet Take 1 tablet (8 mg total) by mouth 2 (two) times daily as needed (Nausea or vomiting). Patient taking differently: Take 8 mg by mouth 2 (two) times daily as needed for vomiting or nausea. 01/05/22   Malachy Mood, MD  oxyCODONE (OXY IR/ROXICODONE) 5 MG immediate release tablet Take 0.5-1 tablets (2.5-5 mg total) by mouth every 12 (twelve) hours as needed for severe pain. 11/13/22   Malachy Mood, MD  Pancrelipase, Lip-Prot-Amyl, [facility]) [phone] units CPEP Take 2 capsules (74,000 Units total) by mouth with breakfast, with lunch, and with evening meal. Take 2 capsules with each meal and 1 with snack 10/26/22   Mansouraty, Netty Starring., MD  polyethylene glycol powder (MIRALAX) 17 GM/SCOOP powder Take 17 g by  mouth in the morning and at bedtime.    [provider]  prochlorperazine (COMPAZINE) 10 MG tablet Take 1 tablet (10 mg total) by mouth every 6 (six) hours as needed (Nausea or vomiting). Patient taking differently: Take 10 mg by mouth every 6 (six) hours as needed for vomiting or nausea. 01/05/22   Malachy Mood, MD  rosuvastatin (CRESTOR) 10 MG tablet TAKE 1 TABLET DAILY 06/17/22   Tolia, Sunit, DO  triamcinolone cream (KENALOG) 0.1 % Apply 1 application topically 2 (two) times daily. Patient taking differently: Apply 1 application  topically 2 (two) times daily as needed  (itching). 01/16/21   Duanne Limerick, MD  zoledronic acid (RECLAST) 5 MG/100ML SOLN injection Inject 5 mg into the vein See admin instructions. Once a year    [provider]      Allergies    Influenza vaccines and Influenza virus vaccine    Review of Systems   Review of Systems  Physical Exam Updated Vital Signs BP 96/74   Pulse 86   Temp 97.7 F (36.5 C) (Oral)   Resp 19   Ht 1.651 m ( )   Wt 68.9 kg   SpO2 98%   BMI 25.29 kg/m  Physical Exam Vitals and nursing note reviewed.  HENT:     Head: Normocephalic.     Right Ear: External ear normal.     Left Ear: External ear normal.     Nose: Nose normal.     Mouth/Throat:     Mouth: Mucous membranes are moist.  Eyes:     Extraocular Movements: Extraocular movements intact.     Pupils: Pupils are equal, round, and reactive to light.  Cardiovascular:     Rate and Rhythm: Normal rate. Rhythm irregular.     Pulses: Normal pulses.  Pulmonary:     Effort: Pulmonary effort is normal.     Breath sounds: Normal breath sounds.  Abdominal:     General: Abdomen is flat. Bowel sounds are normal.  Musculoskeletal:        General: Normal range of motion.     Cervical back: Normal range of motion.  Skin:    General: Skin is warm.     Capillary Refill: Capillary refill takes less than 2 seconds.  Neurological:     General: No focal deficit present.     Mental Status: He is alert.  Psychiatric:        Mood and Affect: Mood normal.     ED Results / Procedures / Treatments   Labs (all labs ordered are listed, but only abnormal results are displayed) Labs Reviewed  COMPREHENSIVE METABOLIC PANEL - Abnormal; Notable for the following components:      Result Value   Creatinine, Ser 1.27 (*)    Total Protein 6.4 (*)    GFR, Estimated 57 (*)    All other components within normal limits  CBC    EKG EKG Interpretation  Date/Time:  Tuesday December 22 2022 09:01:12 EDT Ventricular Rate:  89 PR Interval:    QRS  Duration: 136 QT Interval:  371 QTC Calculation: 452 R Axis:   -29 Text Interpretation: Atrial fibrillation Right bundle branch block Confirmed by Margarita Grizzle [phone]) on 12/22/2022 11:07:48 AM  Radiology DG Chest Port 1 View  Result Date: 12/22/2022 CLINICAL DATA:  Palpitations. EXAM: PORTABLE CHEST 1 VIEW COMPARISON:  Chest x-[facility] dated February 05, 2022. FINDINGS: Interval removal of the right chest wall port catheter. The heart size and mediastinal contours are  within normal limits. Mild diffuse interstitial coarsening. No focal consolidation, pleural effusion, or pneumothorax. No acute osseous abnormality. IMPRESSION: 1.  No active cardiopulmonary disease. Electronically Signed   By: Obie Dredge M.D.   On: 12/22/2022 09:54    Procedures Procedures    Medications Ordered in ED Medications - No data to display  ED Course/ Medical Decision Making/ A&P Clinical Course as of 12/22/22 1112  Tue Dec 22, 2022  1111 CBC reviewed interpreted within normal limits [DR]  1111 Complete metabolic panel reviewed interpreted with mildly elevated creatinine which is stable for this patient. [DR]  1112 Chest x-Kristl Morioka reviewed interpreted and no active cardiopulmonary disease is noted on my interpretation radiologist interpretation concurs [DR]    Clinical Course User Index [DR] Margarita Grizzle, MD                             Medical Decision Making Amount and/or Complexity of Data Reviewed Labs: ordered. Radiology: ordered.  82 year old male history of paroxysmal atrial fibs flutter.  He has had this in the past and been on Cardizem.  Not take his Cardizem today.  He has previously been seen scheduled for cardioversion but had already converted when he got there.  He did not have any symptoms today and found his irregular rhythm when he was checking his pulse ox.  He was checking his pulse ox and a routine check and did not have symptoms.  Here in the ED his blood pressure has been 100 which is normal  for him.  Labs were checked and are normal including a CBC and chemistry. He has remained hemodynamically stable.  We have discussed return precautions and need for follow-up and he voices understanding.         Final Clinical Impression(s) / ED Diagnoses Final diagnoses:  Paroxysmal atrial flutter    Rx / DC Orders ED Discharge Orders     None         Margarita Grizzle, MD 12/22/22 1111    Margarita Grizzle, MD 12/22/22 1112

## 2022-12-22 NOTE — Telephone Encounter (Signed)
Daughter wants to know if patient can get a heart monitor to wear he was in ED today. If so can it be ordered and mailed to him?

## 2022-12-23 ENCOUNTER — Ambulatory Visit: Payer: Medicare Other

## 2022-12-23 ENCOUNTER — Other Ambulatory Visit: Payer: Self-pay | Admitting: Cardiology

## 2022-12-23 ENCOUNTER — Telehealth: Payer: Self-pay

## 2022-12-23 DIAGNOSIS — R002 Palpitations: Secondary | ICD-10-CM

## 2022-12-23 DIAGNOSIS — I4891 Unspecified atrial fibrillation: Secondary | ICD-10-CM | POA: Diagnosis not present

## 2022-12-23 DIAGNOSIS — I4892 Unspecified atrial flutter: Secondary | ICD-10-CM | POA: Diagnosis not present

## 2022-12-23 NOTE — Progress Notes (Signed)
Patient has known history of paroxysmal atrial fibrillation/flutter and currently on anticoagulation for thromboembolic prophylaxis.  He recently had an ED visit for palpitation and is requesting a monitor for evaluation of A-fib/flutter burden.  2-week monitor ordered.  Recommend follow-up in the office thereafter to discuss test results.  Tessa Lerner, [place], [facility]  Pager:  [phone] Office: [phone]

## 2022-12-24 ENCOUNTER — Other Ambulatory Visit: Payer: Self-pay

## 2022-12-24 ENCOUNTER — Telehealth: Payer: Self-pay | Admitting: Neurology

## 2022-12-24 DIAGNOSIS — G20A1 Parkinson's disease without dyskinesia, without mention of fluctuations: Secondary | ICD-10-CM

## 2022-12-24 MED ORDER — CARBIDOPA-LEVODOPA ER 50-200 MG PO TBCR: 1.0000 | EXTENDED_RELEASE_TABLET | Freq: Every day | ORAL | 0 refills | Status: DC

## 2022-12-24 MED ORDER — CARBIDOPA-LEVODOPA 25-100 MG PO TABS: ORAL_TABLET | ORAL | 0 refills | Status: DC

## 2022-12-24 NOTE — Telephone Encounter (Signed)
Called patient he needed refills on carbidopa levodopa and needed [facility] to [facility] . Prescriptions have been sent

## 2022-12-24 NOTE — Telephone Encounter (Signed)
Left message with the after hour service 12-24-22   Patient needs a refill on two of his medication did not leave the name of the medication or pharmacy

## 2022-12-25 ENCOUNTER — Ambulatory Visit: Payer: Medicare Other | Admitting: Cardiology

## 2022-12-25 DIAGNOSIS — M47816 Spondylosis without myelopathy or radiculopathy, lumbar region: Secondary | ICD-10-CM | POA: Diagnosis not present

## 2022-12-25 NOTE — Telephone Encounter (Signed)
Called patient to inform him if he could reschedule to a sooner date. Patient apt is now is 01/27/23@ 1:00

## 2022-12-25 NOTE — Telephone Encounter (Signed)
I had placed an order regarding this please look into it.  I spoke tara about it.  Please arrange follow up after the monitor .   [name] [name] [place], DO, [facility]

## 2022-12-28 ENCOUNTER — Telehealth: Payer: Self-pay

## 2022-12-28 ENCOUNTER — Encounter: Payer: Self-pay | Admitting: Hematology

## 2022-12-28 NOTE — Telephone Encounter (Signed)
     Patient  visit on 12/22/2022  at [facility] was for palpitations.  Have you been able to follow up with your primary care physician? Patient followed up with Cardiologist.  The patient was or was not able to obtain any needed medicine or equipment. No medication prescribed.  Are there diet recommendations that you are having difficulty following? No  Patient expresses understanding of discharge instructions and education provided has no other needs at this time. Yes   Jayli Fogleman [facility]  [facility] Community Resource Care Guide   [email or web address]  ?? [phone]   Website: [email or web address]  [email or web address]

## 2022-12-31 ENCOUNTER — Other Ambulatory Visit: Payer: Self-pay | Admitting: Hematology

## 2022-12-31 DIAGNOSIS — C801 Malignant (primary) neoplasm, unspecified: Secondary | ICD-10-CM

## 2022-12-31 MED ORDER — OXYCODONE HCL 5 MG PO TABS: 2.5000 mg | ORAL_TABLET | Freq: Two times a day (BID) | ORAL | 0 refills | Status: DC | PRN

## 2023-01-05 ENCOUNTER — Encounter: Payer: Self-pay | Admitting: Hematology

## 2023-01-06 ENCOUNTER — Encounter (HOSPITAL_COMMUNITY): Payer: Self-pay | Admitting: Surgery

## 2023-01-06 DIAGNOSIS — M47816 Spondylosis without myelopathy or radiculopathy, lumbar region: Secondary | ICD-10-CM | POA: Diagnosis not present

## 2023-01-13 DIAGNOSIS — R002 Palpitations: Secondary | ICD-10-CM | POA: Diagnosis not present

## 2023-01-13 DIAGNOSIS — I4891 Unspecified atrial fibrillation: Secondary | ICD-10-CM | POA: Diagnosis not present

## 2023-01-15 ENCOUNTER — Encounter: Payer: Self-pay | Admitting: Hematology

## 2023-01-15 ENCOUNTER — Other Ambulatory Visit: Payer: Self-pay

## 2023-01-15 ENCOUNTER — Inpatient Hospital Stay: Payer: Medicare Other | Attending: Internal Medicine

## 2023-01-15 ENCOUNTER — Inpatient Hospital Stay (HOSPITAL_BASED_OUTPATIENT_CLINIC_OR_DEPARTMENT_OTHER): Payer: Medicare Other | Admitting: Hematology

## 2023-01-15 VITALS — BP 96/68 | HR 52 | Temp 97.7°F | Resp 18 | Ht 65.0 in | Wt 155.3 lb

## 2023-01-15 DIAGNOSIS — C250 Malignant neoplasm of head of pancreas: Secondary | ICD-10-CM | POA: Diagnosis not present

## 2023-01-15 DIAGNOSIS — K5900 Constipation, unspecified: Secondary | ICD-10-CM | POA: Diagnosis not present

## 2023-01-15 DIAGNOSIS — I4892 Unspecified atrial flutter: Secondary | ICD-10-CM | POA: Diagnosis not present

## 2023-01-15 DIAGNOSIS — C61 Malignant neoplasm of prostate: Secondary | ICD-10-CM | POA: Diagnosis not present

## 2023-01-15 DIAGNOSIS — C7951 Secondary malignant neoplasm of bone: Secondary | ICD-10-CM | POA: Diagnosis not present

## 2023-01-15 DIAGNOSIS — Z79899 Other long term (current) drug therapy: Secondary | ICD-10-CM | POA: Insufficient documentation

## 2023-01-15 DIAGNOSIS — C259 Malignant neoplasm of pancreas, unspecified: Secondary | ICD-10-CM

## 2023-01-15 LAB — CMP (CANCER CENTER ONLY)
ALT: <5 U/L (ref 0–44)
AST: 23 U/L (ref 15–41)
Albumin: 4 g/dL (ref 3.5–5.0)
Alkaline Phosphatase: 53 U/L (ref 38–126)
Anion gap: 4 — ABNORMAL LOW (ref 5–15)
BUN: 26 mg/dL — ABNORMAL HIGH (ref 8–23)
CO2: 28 mmol/L (ref 22–32)
Calcium: 9.4 mg/dL (ref 8.9–10.3)
Chloride: 109 mmol/L (ref 98–111)
Creatinine: 1.31 mg/dL — ABNORMAL HIGH (ref 0.61–1.24)
GFR, Estimated: 55 mL/min — ABNORMAL LOW (ref >60)
Glucose, Bld: 132 mg/dL — ABNORMAL HIGH (ref 70–99)
Potassium: 4.8 mmol/L (ref 3.5–5.1)
Sodium: 141 mmol/L (ref 135–145)
Total Bilirubin: 1 mg/dL (ref 0.3–1.2)
Total Protein: 6.6 g/dL (ref 6.5–8.1)

## 2023-01-15 LAB — CBC WITH DIFFERENTIAL (CANCER CENTER ONLY)
Abs Immature Granulocytes: 0.04 10*3/uL (ref 0.00–0.07)
Basophils Absolute: 0 10*3/uL (ref 0.0–0.1)
Basophils Relative: 1 %
Eosinophils Absolute: 0.3 10*3/uL (ref 0.0–0.5)
Eosinophils Relative: 5 %
HCT: 38.6 % — ABNORMAL LOW (ref 39.0–52.0)
Hemoglobin: 12.8 g/dL — ABNORMAL LOW (ref 13.0–17.0)
Immature Granulocytes: 1 %
Lymphocytes Relative: 14 %
Lymphs Abs: 1 10*3/uL (ref 0.7–4.0)
MCH: 30.7 pg (ref 26.0–34.0)
MCHC: 33.2 g/dL (ref 30.0–36.0)
MCV: 92.6 fL (ref 80.0–100.0)
Monocytes Absolute: 0.8 10*3/uL (ref 0.1–1.0)
Monocytes Relative: 11 %
Neutro Abs: 4.7 10*3/uL (ref 1.7–7.7)
Neutrophils Relative %: 68 %
Platelet Count: 170 10*3/uL (ref 150–400)
RBC: 4.17 MIL/uL — ABNORMAL LOW (ref 4.22–5.81)
RDW: 13.9 % (ref 11.5–15.5)
WBC Count: 6.8 10*3/uL (ref 4.0–10.5)
nRBC: 0 % (ref 0.0–0.2)

## 2023-01-15 NOTE — Progress Notes (Signed)
[facility]   [phone] Fax:([phone]) [phone]   Clinic Follow up Note    Patient Care Team: Lorenda Ishihara, MD as PCP - General (Internal Medicine) Tat, Octaviano Batty, DO as Consulting Physician (Neurology) Earna Coder, MD as Consulting Physician (Hematology and Oncology) Fritzi Mandes, MD as Consulting Physician (General Surgery) Malachy Mood, MD as Consulting Physician (Hematology and Oncology)   Date of Service:  01/15/2023   CHIEF COMPLAINT: f/u of pancreatic cancer    CURRENT THERAPY:  Supportive care    ASSESSMENT:  [name] [name] is a 82 y.o. male with    Pancreatic cancer (HCC) -diagnosed in 09/2021, deemed poor surgical candidate due to his age and medical co-morbidities -S/p single agent gemcitabine 01/13/22 - 04/01/22 and 5 fx SBRT [name]) 05/04/22 - 05/14/22. CA 19-9 decreased after treatment -Surveillance CT 11/09/2022 showed slightly decreased size of pancreatic head mass, no evidence of metastatic disease.  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  He is taking ibuprofen as needed.  No other clinical concern for cancer progression. -Lab reviewed, overall stable, tumor marker still pending. -Follow-up in 2 months, plan to repeat a CT scan in 4 months.  Patient is interested in more chemo if he has disease progression.   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 Staging CT AP on 05/16/19 showed two suspicious left pelvic lesions. He was started on Eligard q64months -Patient also receives Reclast once a year. -he did not tolerate Zytiga -Last PSA drawn on 04/14/22 showed good response at <0.01  -continue Eligard every 6 months, will receive per urologist Dr. Berneice Heinrich  -restaging CT CAP 3/4//24 showed stable osseous metastasis at left iliac and pubic bones.       PLAN: - reviewed  labs - f/u and labs in 2 months - f/u with GI for stomach issues - reviewed medication      SUMMARY OF ONCOLOGIC HISTORY:     Oncology History Overview Note   # PROSTATE CANCER- METATSTATIC to BONE; PSA- 26.5. Sclerotic 1.5 cm left ischial lesion/ Sclerotic medial left iliac bone 1.5 cm lesion (series 2/image 47), increased from 1.0 cm. Gleason score of 4+5= 9; with almost all cores involved greater than 80%.  9/16 Lupron 78-month depot on 9/16. [Urology; Dr.Siniski]   # MID OCT 2020- Zytiga 1000 mg+ prednisone; stopped December 2021 [poor tolerance if with RVR]; DISCONTINUED.    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         Prostate cancer metastatic to bone (HCC)   05/24/2019 Initial Diagnosis     Prostate cancer metastatic to bone [facility])     Pancreatic cancer (HCC)   10/02/2021 Imaging     CT ABDOMEN PELVIS W CONTRAST    IMPRESSION: 1. There is new pancreatic ductal dilation with an indeterminate 19 mm hypodense low-density mass area in the head/uncinate process of the pancreas. Recommend further evaluation with dedicated MRI/MRCP with contrast. 2. No evidence of bowel obstruction.  Moderate rectal stool ball. 3. Scattered peripherally located clustered pulmonary nodules in the RIGHT middle lobe. Findings are likely infectious or inflammatory in etiology. Given history of  malignancy, recommend follow-up as per clinical protocol.     10/28/2021 Imaging     MR ABDOMEN MRCP W WO CONTAST    IMPRESSION: 1. Examination is significantly limited by breath motion artifact throughout. 2. The main pancreatic duct is diffusely dilated from the level of the superior pancreatic head, measuring up to 0.7 cm. 3. In the inferior pancreatic head and uncinate, there is  a multilobulated, fluid signal cystic lesion measuring 1.8 x 1.0 cm. Due to breath motion artifact it is difficult to determine whether this communicates to the adjacent duct. There are multiple additional subcentimeter cystic lesions scattered throughout the pancreas, several of which clearly communicate to the main pancreatic duct. 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 Recommend EUS/FNA for further diagnosis given the presence of pancreatic ductal dilatation. 4. Status post left nephrectomy. 5. No evidence of recurrent or metastatic disease in the abdomen.     12/25/2021 Procedure     UPPER ENDOSCOPIC ULTRASOUND-By Dr. Meridee Score   - A mass-like region was identified in the pancreatic head where the pancreatic duct dilates with multiple cystic regions noted throughout the pancreas as well. 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 However, the endosonographic appearance is suspicious for potential adenocarcinoma. This was staged T2 N0 Mx by endosonographic criteria. T - No malignant-appearing lymph nodes were visualized in the celiac region (level 20), peripancreatic region and porta hepatis region.     12/25/2021 Pathology Results     CYTOLOGY - NON PAP  CASE: [id number]    FINAL MICROSCOPIC DIAGNOSIS:  A. PANCREAS, HEAD, FINE NEEDLE ASPIRATION:  - Malignant cells consistent with adenocarcinoma        01/01/2022 Initial Diagnosis     Pancreatic cancer (HCC)     01/13/2022 - 04/01/2022 Chemotherapy     Patient is on Treatment Plan : PANCREAS Gemcitabine D1,15 q28d x 4 Cycles      01/26/2022 Cancer Staging     Staging form: Exocrine Pancreas, AJCC 8th Edition - Clinical: Stage IB (cT2, cN0, cM0) - Signed by Malachy Mood, MD on 01/26/2022 Total positive nodes: 0       Genetic Testing     [facility] CancerNext-Expanded Panel was Negative. Of note, a variant of uncertain significance was identified in the BLM gene  (p.N936D) and GALNT12 gene ([id number]). Report date is 01/26/2022.   The CancerNext-Expanded gene panel offered by [facility] and includes sequencing, rearrangement, and RNA analysis for the following 77 genes: AIP, ALK, APC, ATM, AXIN2, BAP1, BARD1, BLM, BMPR1A, BRCA1, BRCA2, BRIP1, CDC73, CDH1, CDK4, CDKN1B, CDKN2A, CHEK2, CTNNA1, DICER1, FANCC, FH, FLCN, GALNT12, KIF1B, LZTR1, MAX, MEN1, MET, MLH1, MSH2, MSH3, MSH6, MUTYH, NBN, NF1, NF2, NTHL1, PALB2, PHOX2B, PMS2, POT1, PRKAR1A, PTCH1, PTEN, RAD51C, RAD51D, RB1, RECQL, RET, SDHA, SDHAF2, SDHB, SDHC, SDHD, SMAD4, SMARCA4, SMARCB1, SMARCE1, STK11, SUFU, TMEM127, TP53, TSC1, TSC2, VHL and XRCC2 (sequencing and deletion/duplication); EGFR, EGLN1, HOXB13, KIT, MITF, PDGFRA, POLD1, and POLE (sequencing only); EPCAM and GREM1 (deletion/duplication only).      08/12/2022 Imaging      IMPRESSION: 1. Slight interval increase in size of the pancreatic head-uncinate process mass with increasing direct contact of the SMA and portal vein as discussed. Also with a replaced RIGHT hepatic artery which passes through the tumor. 2. No signs of acute inflammation currently about the pancreas. With similar appearance of ductal obstruction and peripheral atrophy of pancreas due to the tumor. 3. Post LEFT nephrectomy. 4. Sclerotic bony lesions of the LEFT  ischium and LEFT iliac bone similar to prior imaging. 5. Mild hyperenhancement of LEFT prostate 14 mm with asymmetry, nonspecific. This is unchanged in this patient with history of prostate neoplasm. 6. Aortic atherosclerosis.           INTERVAL HISTORY:  STEADMAN MANGLICMOT is here for a follow up of pancreatic cancer. He was last seen by me on 11/13/2022. He presents to the clinic with on daughter in person and one on the phone. Patient has muscular pain in his lower to upper back. Pain has had the pain for a couple of months, didn't have pain before the treatment. Patient states he is eating well  maintaining his weight. He is having issues with constipation, using Miralax to treat. Also taking a stool softener. Tumor marker has improved.   All other systems were reviewed with the patient and are negative.   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)     Prostate cancer metastatic to bone (HCC)     REM sleep behavior disorder     Sleep apnea      BiPap      SURGICAL HISTORY:      Past Surgical History:  Procedure Laterality Date   APPENDECTOMY   1963   BIOPSY   12/25/2021    Procedure: BIOPSY;  Surgeon: Lemar Lofty., MD;  Location: [facility] ENDOSCOPY;  Service: Gastroenterology;;   COLONOSCOPY   2010, 2015   ESOPHAGOGASTRODUODENOSCOPY N/A 04/16/2022    Procedure: ESOPHAGOGASTRODUODENOSCOPY (EGD);  Surgeon: Lemar Lofty., MD;  Location: [facility] ENDOSCOPY;  Service: Gastroenterology;  Laterality: N/A;   ESOPHAGOGASTRODUODENOSCOPY (EGD) WITH PROPOFOL N/A 12/25/2021    Procedure: ESOPHAGOGASTRODUODENOSCOPY (EGD) WITH PROPOFOL;  Surgeon: Meridee Score Netty Starring., MD;  Location: [facility] ENDOSCOPY;  Service: Gastroenterology;  Laterality: N/A;   EUS N/A 12/25/2021    Procedure: UPPER ENDOSCOPIC ULTRASOUND (EUS) RADIAL;  Surgeon: Lemar Lofty., MD;  Location: [facility] ENDOSCOPY;  Service: Gastroenterology;  Laterality: N/A;   EUS N/A 04/16/2022    Procedure: UPPER ENDOSCOPIC ULTRASOUND (EUS) RADIAL;  Surgeon: Lemar Lofty., MD;  Location: [facility] ENDOSCOPY;  Service: Gastroenterology;  Laterality: N/A;   FIDUCIAL MARKER PLACEMENT N/A 04/16/2022    Procedure: FIDUCIAL MARKER PLACEMENT;  Surgeon: Lemar Lofty., MD;  Location: [facility] ENDOSCOPY;  Service: Gastroenterology;  Laterality: N/A;   FINE NEEDLE ASPIRATION    12/25/2021    Procedure: FINE NEEDLE ASPIRATION (FNA) LINEAR;  Surgeon: Lemar Lofty., MD;  Location: [facility] ENDOSCOPY;  Service: Gastroenterology;;   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   KYPHOPLASTY N/A 08/15/2020    Procedure: L2 compression fracture;  Surgeon: Kennedy Bucker, MD;  Location: [facility] ORS;  Service: Orthopedics;  Laterality: N/A;   KYPHOPLASTY N/A 08/29/2020    Procedure: T8 KYPHOPLASTY;  Surgeon: Kennedy Bucker, MD;  Location: [facility] ORS;  Service: Orthopedics;  Laterality: N/A;   KYPHOPLASTY N/A 11/12/2020    Procedure: L1 KYPHOPLASTY;  Surgeon: Kennedy Bucker, MD;  Location: [facility] ORS;  Service: Orthopedics;  Laterality: N/A;   PORTACATH PLACEMENT N/A 02/05/2022    Procedure: INSERTION PORT-A-CATH;  Surgeon: Fritzi Mandes, MD;  Location: [facility] ORS;  Service: General;  Laterality: N/A;  I have reviewed the social history and family history with the patient and they are unchanged from previous note.   ALLERGIES:  is allergic to influenza vaccines.   MEDICATIONS:        Current Outpatient Medications  Medication Sig Dispense Refill   acetaminophen (TYLENOL) 500 MG tablet Take 1 tablet (500 mg total) by mouth every 6 (six) hours as needed for moderate pain or mild pain. 30 tablet 0   apixaban (ELIQUIS) 5 MG TABS tablet Take 1 tablet (5 mg total) by mouth 2 (two) times daily. 120 tablet 3   carbidopa-levodopa (SINEMET CR) 50-200 MG tablet TAKE 1 TABLET AT BEDTIME 90 tablet 0   carbidopa-levodopa (SINEMET IR) 25-100 MG tablet 3 tablets at 7 AM/2 at 11 AM/ 2 tablets at 3 PM/ 1 tablet at 4 pm / 2 tablet at 7 PM 900 tablet 0   clonazePAM (KLONOPIN) 0.5 MG tablet Take 0.5 mg by mouth at bedtime.       cyanocobalamin (VITAMIN B12) 1000 MCG tablet Take 1,000 mcg by mouth daily.       diltiazem (CARDIZEM) 30 MG tablet TAKE 1 TABLET TWICE A DAY 180 tablet 3   Glycerin-Hypromellose-PEG 400 (VISINE DRY EYE OP) Place 1 drop into both eyes 2 (two) times daily as needed (eye irritation).         ketoconazole (NIZORAL) 2 % shampoo Apply 1 application  topically 3 (three) times a week.       leuprolide, 6 Month, (ELIGARD) 45 MG injection Inject 45 mg into the skin every 6 (six) months.       omeprazole (PRILOSEC) 20 MG capsule TAKE 1 CAPSULE DAILY 90 capsule 3   ondansetron (ZOFRAN) 8 MG tablet Take 1 tablet (8 mg total) by mouth 2 (two) times daily as needed (Nausea or vomiting). (Patient taking differently: Take 8 mg by mouth 2 (two) times daily as needed for vomiting or nausea.) 30 tablet 1   oxyCODONE (OXY IR/ROXICODONE) 5 MG immediate release tablet Take 0.5-1 tablets (2.5-5 mg total) by mouth every 12 (twelve) hours as needed for severe pain. 30 tablet 0   Pancrelipase, Lip-Prot-Amyl, (PANCREAZE) 37000-97300 units CPEP Take 2 capsules (74,000 Units total) by mouth with breakfast, with lunch, and with evening meal. Take 2 capsules with each meal and 1 with snack 600 capsule 1   polyethylene glycol powder (MIRALAX) 17 GM/SCOOP powder Take 17 g by mouth in the morning and at bedtime.       prochlorperazine (COMPAZINE) 10 MG tablet Take 1 tablet (10 mg total) by mouth every 6 (six) hours as needed (Nausea or vomiting). (Patient taking differently: Take 10 mg by mouth every 6 (six) hours as needed for vomiting or nausea.) 30 tablet 1   rosuvastatin (CRESTOR) 10 MG tablet TAKE 1 TABLET DAILY 90 tablet 3   triamcinolone cream (KENALOG) 0.1 % Apply 1 application topically 2 (two) times daily. (Patient taking differently: Apply 1 application  topically 2 (two) times daily as needed (itching).) 453.6 g 1   zoledronic acid (RECLAST) 5 MG/100ML SOLN injection Inject 5 mg into the vein See admin instructions. Once a year        No current facility-administered medications for this visit.      PHYSICAL EXAMINATION: ECOG PERFORMANCE STATUS: 2 - Symptomatic, <50% confined to bed      Vitals:    11/13/22 1246  BP: 103/61  Pulse: (!) 57  Resp: 16  Temp: 97.8 F (36.6 C)  SpO2: 98%  Wt  Readings from Last 3 Encounters:  11/13/22 152 lb 11.2 oz (69.3 kg)  10/26/22 153 lb 6.4 oz (69.6 kg)  10/13/22 151 lb 4.8 oz (68.6 kg)      GENERAL:alert, no distress and comfortable SKIN: skin color, texture, turgor are normal, no rashes or significant lesions EYES: normal, Conjunctiva are pink and non-injected, sclera clear NECK: supple, thyroid normal size, non-tender, without nodularity LYMPH:  no palpable lymphadenopathy in the cervical, axillary  LUNGS: clear to auscultation and percussion with normal breathing effort HEART: regular rate & rhythm and no murmurs and no lower extremity edema ABDOMEN:abdomen soft, non-tender and normal bowel sounds Musculoskeletal:no cyanosis of digits and no clubbing  NEURO: alert & oriented x 3 with fluent speech, no focal motor/sensory deficits   LABORATORY DATA:  I have reviewed the data as listed     Latest Ref Rng & Units 11/13/2022   12:28 PM 10/25/2022    4:51 PM 10/13/2022   10:32 AM  CBC  WBC 4.0 - 10.5 K/uL 5.3  5.1  4.6   Hemoglobin 13.0 - 17.0 g/dL 29.5  62.1  30.8   Hematocrit 39.0 - 52.0 % 39.4  41.6  41.0   Platelets 150 - 400 K/uL 150  194  193             Latest Ref Rng & Units 11/13/2022   12:28 PM 10/25/2022    4:51 PM 10/13/2022   10:32 AM  CMP  Glucose 70 - 99 mg/dL 657  846  93   BUN 8 - 23 mg/dL 21  21  15    Creatinine 0.61 - 1.24 mg/dL 9.62  9.52  8.41   Sodium 135 - 145 mmol/L 141  140  145   Potassium 3.5 - 5.1 mmol/L 4.5  4.1  4.5   Chloride 98 - 111 mmol/L 107  107  111   CO2 22 - 32 mmol/L 29  25  29    Calcium 8.9 - 10.3 mg/dL 9.5  9.9  32.4   Total Protein 6.5 - 8.1 g/dL 6.7    6.8   Total Bilirubin 0.3 - 1.2 mg/dL 0.7    1.2   Alkaline Phos 38 - 126 U/L 52    57   AST 15 - 41 U/L 22    25   ALT 0 - 44 U/L 3    10           RADIOGRAPHIC STUDIES: I have personally reviewed the radiological images as listed and agreed with the findings in the report. Imaging Results (Last 48 hours)  No results found.      All questions were answered. The patient knows to call the clinic with any problems, questions or concerns. No barriers to learning was detected. The total time spent in the appointment was 30 minutes.      Malachy Mood, MD 01/15/2023

## 2023-01-16 LAB — CANCER ANTIGEN 19-9: CA 19-9: 220 U/mL — ABNORMAL HIGH (ref 0–35)

## 2023-01-18 ENCOUNTER — Emergency Department (HOSPITAL_BASED_OUTPATIENT_CLINIC_OR_DEPARTMENT_OTHER)
Admission: EM | Admit: 2023-01-18 | Discharge: 2023-01-18 | Disposition: A | Discharge status: Home / Self Care | Payer: Medicare Other | Attending: Emergency Medicine | Admitting: Emergency Medicine

## 2023-01-18 ENCOUNTER — Other Ambulatory Visit: Payer: Self-pay

## 2023-01-18 ENCOUNTER — Emergency Department (HOSPITAL_BASED_OUTPATIENT_CLINIC_OR_DEPARTMENT_OTHER): Payer: Medicare Other

## 2023-01-18 DIAGNOSIS — Z8546 Personal history of malignant neoplasm of prostate: Secondary | ICD-10-CM | POA: Diagnosis not present

## 2023-01-18 DIAGNOSIS — I4891 Unspecified atrial fibrillation: Secondary | ICD-10-CM

## 2023-01-18 DIAGNOSIS — Z79899 Other long term (current) drug therapy: Secondary | ICD-10-CM | POA: Insufficient documentation

## 2023-01-18 DIAGNOSIS — R000 Tachycardia, unspecified: Secondary | ICD-10-CM | POA: Diagnosis present

## 2023-01-18 DIAGNOSIS — Z7901 Long term (current) use of anticoagulants: Secondary | ICD-10-CM | POA: Insufficient documentation

## 2023-01-18 DIAGNOSIS — G20A1 Parkinson's disease without dyskinesia, without mention of fluctuations: Secondary | ICD-10-CM | POA: Insufficient documentation

## 2023-01-18 LAB — CBC
HCT: 46.3 % (ref 39.0–52.0)
Hemoglobin: 14.8 g/dL (ref 13.0–17.0)
MCH: 29.9 pg (ref 26.0–34.0)
MCHC: 32 g/dL (ref 30.0–36.0)
MCV: 93.5 fL (ref 80.0–100.0)
Platelets: 190 10*3/uL (ref 150–400)
RBC: 4.95 MIL/uL (ref 4.22–5.81)
RDW: 13.6 % (ref 11.5–15.5)
WBC: 6 10*3/uL (ref 4.0–10.5)
nRBC: 0 % (ref 0.0–0.2)

## 2023-01-18 LAB — BASIC METABOLIC PANEL
Anion gap: 10 (ref 5–15)
BUN: 25 mg/dL — ABNORMAL HIGH (ref 8–23)
CO2: 25 mmol/L (ref 22–32)
Calcium: 9.9 mg/dL (ref 8.9–10.3)
Chloride: 106 mmol/L (ref 98–111)
Creatinine, Ser: 1.24 mg/dL (ref 0.61–1.24)
GFR, Estimated: 58 mL/min — ABNORMAL LOW (ref >60)
Glucose, Bld: 96 mg/dL (ref 70–99)
Potassium: 4.2 mmol/L (ref 3.5–5.1)
Sodium: 141 mmol/L (ref 135–145)

## 2023-01-18 MED ORDER — SODIUM CHLORIDE 0.9 % IV BOLUS
1000.0000 mL | Freq: Once | INTRAVENOUS | Status: AC
  Administered 2023-01-18: 1000 mL via INTRAVENOUS

## 2023-01-18 MED ORDER — PROPOFOL 10 MG/ML IV BOLUS
INTRAVENOUS | Status: AC | PRN
  Administered 2023-01-18: 20 mg via INTRAVENOUS

## 2023-01-18 MED ORDER — PROPOFOL 10 MG/ML IV BOLUS
20.0000 mg | Freq: Once | INTRAVENOUS | Status: DC
  Filled 2023-01-18: qty 20

## 2023-01-18 MED ORDER — DILTIAZEM HCL 30 MG PO TABS
15.0000 mg | ORAL_TABLET | Freq: Once | ORAL | Status: AC
  Administered 2023-01-18: 15 mg via ORAL
  Filled 2023-01-18: qty 1

## 2023-01-18 NOTE — ED Notes (Signed)
Meal given. called. Will be here shortly for transport home.

## 2023-01-18 NOTE — ED Provider Notes (Signed)
[place] EMERGENCY DEPARTMENT AT [facility] Provider Note   CSN: [id number] Arrival date & time: 01/18/23  0940     History  Chief Complaint  Patient presents with   Palpitations    [name] [name] is a 82 y.o. male.  The history is provided by the patient.  Patient with history of paroxysmal atrial flutter, BPH, Parkinson's disease presents for elevated heart rate.  Patient reports he woke up this morning and checked his blood pressure and noted his heart rate was elevated.  He suspected that he was in an abnormal rhythm.  He reports he was feeling well yesterday and when he checked his blood pressure his heart rate was normal.  He suspects he went into atrial flutter over the past 12 hours.  He has not taken his daily dose of Cardizem recently due to low blood pressure.  No fevers or vomiting.  No chest pain or shortness of breath.  He is otherwise at his baseline.  He had a small breakfast around 8 AM. 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 Authorizing Provider  acetaminophen (TYLENOL) 500 MG tablet Take 1 tablet (500 mg total) by mouth every 6 (six) hours as needed for moderate pain or mild pain. 04/18/22   Mansouraty, Netty Starring., MD  apixaban (ELIQUIS) 5 MG TABS tablet Take 1 tablet (5 mg total) by mouth 2 (two) times daily. 06/18/22   Tolia, Sunit, DO  carbidopa-levodopa (SINEMET CR) 50-200 MG tablet Take 1 tablet by mouth at bedtime. 12/24/22   Tat, Octaviano Batty, DO   carbidopa-levodopa (SINEMET IR) 25-100 MG tablet Take 3 tablets at 7 AM, 2 tablets at 11 AM, 3 tablets at 3 PM, 2 tablets at 7 PM 12/24/22   Tat, Lurena Joiner S, DO  clonazePAM (KLONOPIN) 0.5 MG tablet Take 0.5 mg by mouth at bedtime. 02/17/21   [provider]  cyanocobalamin (VITAMIN B12) 1000 MCG tablet Take 1,000 mcg by mouth daily.    [provider]  diltiazem (CARDIZEM) 30 MG tablet TAKE 1 TABLET TWICE A DAY 11/09/22   Tolia, Sunit, DO  Glycerin-Hypromellose-PEG 400 (VISINE DRY EYE OP) Place 1 drop into both eyes 2 (two) times daily as needed (eye irritation).     [provider]  ketoconazole (NIZORAL) 2 % shampoo Apply 1 application  topically 3 (three) times a week. 02/15/19   [provider]  leuprolide, 6 Month, (ELIGARD) 45 MG injection Inject 45 mg into the skin every 6 (six) months.    [provider]  omeprazole (PRILOSEC) 20 MG capsule TAKE 1 CAPSULE DAILY 09/09/22   Malachy Mood, MD  ondansetron (ZOFRAN) 8 MG tablet Take 1 tablet (8 mg total) by mouth 2 (two) times daily as needed (Nausea or vomiting). Patient taking differently: Take 8 mg by mouth 2 (two) times daily as needed for vomiting or nausea. 01/05/22   Malachy Mood, MD  oxyCODONE (  OXY IR/ROXICODONE) 5 MG immediate release tablet Take 0.5-1 tablets (2.5-5 mg total) by mouth every 12 (twelve) hours as needed for severe pain. 12/31/22   Malachy Mood, MD  Pancrelipase, Lip-Prot-Amyl, [facility]) [phone] units CPEP Take 2 capsules (74,000 Units total) by mouth with breakfast, with lunch, and with evening meal. Take 2 capsules with each meal and 1 with snack 10/26/22   Mansouraty, Netty Starring., MD  polyethylene glycol powder (MIRALAX) 17 GM/SCOOP powder Take 17 g by mouth in the morning and at bedtime.    [provider]  prochlorperazine (COMPAZINE) 10 MG tablet Take 1 tablet (10 mg total) by mouth every 6 (six) hours as needed (Nausea or vomiting). Patient taking differently: Take 10 mg by mouth  every 6 (six) hours as needed for vomiting or nausea. 01/05/22   Malachy Mood, MD  rosuvastatin (CRESTOR) 10 MG tablet TAKE 1 TABLET DAILY 06/17/22   Tolia, Sunit, DO  triamcinolone cream (KENALOG) 0.1 % Apply 1 application topically 2 (two) times daily. Patient taking differently: Apply 1 application  topically 2 (two) times daily as needed (itching). 01/16/21   Duanne Limerick, MD  zoledronic acid (RECLAST) 5 MG/100ML SOLN injection Inject 5 mg into the vein See admin instructions. Once a year    [provider]      Allergies    Influenza vaccines and Influenza virus vaccine    Review of Systems   Review of Systems  Constitutional:  Negative for fever.  Gastrointestinal:  Negative for vomiting.    Physical Exam Updated Vital Signs BP (!) 140/79   Pulse (!) 51   Temp 98.1 F (36.7 C) (Oral)   Resp 17   Ht 1.651 m (5\' 5" )   Wt 68.9 kg   SpO2 97%   BMI 25.29 kg/m  Physical Exam CONSTITUTIONAL: Elderly, no acute distress HEAD: Normocephalic/atraumatic EYES: EOMI/PERRL ENMT: Mucous membranes moist NECK: supple no meningeal signs SPINE/BACK:entire spine nontender CV: Irregular, tachycardic LUNGS: Lungs are clear to auscultation bilaterally, no apparent distress ABDOMEN: soft, nontender NEURO: Pt is awake/alert/appropriate, moves all extremitiesx4.  No facial droop.   EXTREMITIES: pulses normal/equal, full ROM SKIN: warm, color normal PSYCH: Flat affect  ED Results / Procedures / Treatments   Labs (all labs ordered are listed, but only abnormal results are displayed) Labs Reviewed  BASIC METABOLIC PANEL - Abnormal; Notable for the following components:      Result Value   BUN 25 (*)    GFR, Estimated 58 (*)    All other components within normal limits  CBC    EKG EKG Interpretation  Date/Time:  Monday Jan 18 2023 12:29:53 EDT Ventricular Rate:  53 PR Interval:  230 QRS Duration: 140 QT Interval:  439 QTC Calculation: 413 R Axis:   -37 Text  Interpretation: Sinus or ectopic atrial rhythm Prolonged PR interval Right bundle branch block Confirmed by Zadie Rhine ([id number]) on 01/18/2023 12:47:40 PM  Radiology DG Chest Port 1 View  Result Date: 01/18/2023 CLINICAL DATA:  Atrial fibrillation EXAM: PORTABLE CHEST 1 VIEW COMPARISON:  None Available. FINDINGS: Hyperinflation. No consolidation, pneumothorax or effusion. No edema. Normal cardiopericardial silhouette. Overlapping cardiac leads. Augmentation cement seen along multiple vertebral bodies of the spine, unchanged from previous. IMPRESSION: No acute cardiopulmonary disease. Electronically Signed   By: Karen Kays M.D.   On: 01/18/2023 10:09    Procedures .Sedation  Date/Time: 01/18/2023 12:23 PM  Performed by: Zadie Rhine, MD Authorized by: Zadie Rhine, MD   Consent:    Consent obtained:  Written   Consent given by:  Patient   Risks discussed:  Nausea, vomiting, prolonged hypoxia resulting in organ damage, respiratory compromise necessitating ventilatory assistance and intubation and dysrhythmia Universal protocol:    Immediately prior to procedure, a time out was called: yes   Indications:    Procedure performed:  Cardioversion   Procedure necessitating sedation performed by:  Physician performing sedation Pre-sedation assessment:    Time since last food or drink:  4   ASA classification: class 2 - patient with mild systemic disease     Mallampati score:  I - soft palate, uvula, fauces, pillars visible   Neck mobility: normal     Pre-sedation assessments completed and reviewed: airway patency and mental status     Pre-sedation assessment completed:  01/18/2023 12:24 PM Immediate pre-procedure details:    Reassessment: Patient reassessed immediately prior to procedure     Reviewed: vital signs, relevant labs/tests and NPO status   Procedure details (see MAR for exact dosages):    Preoxygenation:  Nasal cannula   Sedation:  Dexmedetomidine   Intended level of  sedation: deep   Intra-procedure monitoring:  Blood pressure monitoring, cardiac monitor, continuous pulse oximetry, frequent LOC assessments and frequent vital sign checks   Intra-procedure events: none     Total Provider sedation time (minutes):  16 Post-procedure details:    Post-sedation assessment completed:  01/18/2023 12:52 PM   Attendance: Constant attendance by certified staff until patient recovered     Post-sedation assessments completed and reviewed: airway patency, mental status and respiratory function     Patient is stable for discharge or admission: yes     Procedure completion:  Tolerated well, no immediate complications .Cardioversion  Date/Time: 01/18/2023 12:52 PM  Performed by: Zadie Rhine, MD Authorized by: Zadie Rhine, MD   Consent:    Consent obtained:  Written   Consent given by:  Patient   Risks discussed:  Death, cutaneous burn, induced arrhythmia and pain   Alternatives discussed:  No treatment Pre-procedure details:    Cardioversion basis:  Emergent   Rhythm:  Atrial fibrillation   Electrode placement:  Anterior-posterior Patient sedated: Yes. Refer to sedation procedure documentation for details of sedation.  Attempt one:    Cardioversion mode:  Synchronous   Waveform:  Biphasic   Shock (Joules):  150   Shock outcome:  Conversion to normal sinus rhythm Post-procedure details:    Patient status:  Alert   Patient tolerance of procedure:  Tolerated well, no immediate complications     Medications Ordered in ED Medications  propofol (DIPRIVAN) 10 mg/mL bolus/IV push 20 mg (20 mg Intravenous See Procedure Record 01/18/23 1226)  diltiazem (CARDIZEM) tablet 15 mg (15 mg Oral Given 01/18/23 1010)  sodium chloride 0.9 % bolus 1,000 mL (1,000 mLs Intravenous New Bag/Given 01/18/23 1213)  propofol (DIPRIVAN) 10 mg/mL bolus/IV push (20 mg Intravenous Given 01/18/23 1227)    ED Course/ Medical Decision Making/ A&P Clinical Course as of 01/18/23 1344   Mon Jan 18, 2023  1006 Patient with extensive medical history presents in atrial fibrillation.  He reports he was in a normal rhythm yesterday, and he has been taking his Eliquis as scheduled.  We discussed the risk and benefits of cardioversion in the Emergency Department.  He would like to proceed with cardioversion, but we will defer this for at least 2 hours since his last meal was around 8 AM.  We will give him half dose of his usual Cardizem to see if this helps  his heart rate.  Patient stable at this time [DW]  1106 Pt improved, no new complaints  [DW]  1343 Patient improved back to baseline.  Repeat EKG reveals sinus rhythm.  He is requesting food.  He will have his son drive him home.  He can follow-up with cardiology [DW]    Clinical Course User Index [DW] Zadie Rhine, MD                             Medical Decision Making Amount and/or Complexity of Data Reviewed Labs: ordered. Radiology: ordered. ECG/medicine tests: ordered.  Risk Prescription drug management.   This patient presents to the ED for concern of elevated heart rate and palpitations, this involves an extensive number of treatment options, and is a complaint that carries with it a high risk of complications and morbidity.  The differential diagnosis includes but is not limited to SVT, atrial flutter, atrial fibrillation, sinus tachycardia, ventricular tachycardia  Comorbidities that complicate the patient evaluation: Patient's presentation is complicated by their history of atrial flutter and Parkinson's  Social Determinants of Health: Patient's  poor mobility   increases the complexity of managing their presentation  Additional history obtained: Records reviewed  cardiology notes reviewed  Lab Tests: I Ordered, and personally interpreted labs.  The pertinent results include:  labs unremarkable  Imaging Studies ordered: I ordered imaging studies including X-ray chest   I independently visualized and  interpreted imaging which showed no acute findings I agree with the radiologist interpretation  Cardiac Monitoring: The patient was maintained on a cardiac monitor.  I personally viewed and interpreted the cardiac monitor which showed an underlying rhythm of:  Atrial Fibrillation   Critical Interventions:   cardioversion   Reevaluation: After the interventions noted above, I reevaluated the patient and found that they have :improved  Complexity of problems addressed: Patient's presentation is most consistent with  acute presentation with potential threat to life or bodily function  Disposition: After consideration of the diagnostic results and the patient's response to treatment,  I feel that the patent would benefit from discharge   .           Final Clinical Impression(s) / ED Diagnoses Final diagnoses:  Atrial fibrillation, unspecified type [facility])    Rx / DC Orders ED Discharge Orders     None         Zadie Rhine, MD 01/18/23 1344

## 2023-01-18 NOTE — ED Notes (Signed)
Son arrives to transport patient. Pt and family verbalized understanding of d/c instructions, meds, and followup care. Denies questions. VSS, no distress noted. Steady gait to exit with all belongings.

## 2023-01-18 NOTE — ED Triage Notes (Signed)
Pt arrived POV, caox4, ambulatory with walker in triage. Pt reports he was checking his BP this morning and found his HR to be elevated and irregular (reports HR 120). Pt denies SOB, CP, dizziness, weakness or any other complaints. Hx afib, pt states he takes Cardizem PRN but does not take it often and has not taken it recently.

## 2023-01-20 NOTE — ED Notes (Signed)
Late entry - capnography, pulse ox, and monitor maintained throughout procedure, pt maintained normal RR, SpO2 with no s/s of respiratory distress throughout and post procedure.

## 2023-01-27 ENCOUNTER — Ambulatory Visit: Payer: Medicare Other | Admitting: Cardiology

## 2023-01-27 ENCOUNTER — Encounter: Payer: Self-pay | Admitting: Cardiology

## 2023-01-27 VITALS — BP 116/74 | HR 54 | Resp 16 | Ht 65.0 in | Wt 154.0 lb

## 2023-01-27 DIAGNOSIS — G4733 Obstructive sleep apnea (adult) (pediatric): Secondary | ICD-10-CM | POA: Diagnosis not present

## 2023-01-27 DIAGNOSIS — Z7901 Long term (current) use of anticoagulants: Secondary | ICD-10-CM | POA: Diagnosis not present

## 2023-01-27 DIAGNOSIS — R002 Palpitations: Secondary | ICD-10-CM | POA: Diagnosis not present

## 2023-01-27 DIAGNOSIS — I700 Atherosclerosis of aorta: Secondary | ICD-10-CM | POA: Diagnosis not present

## 2023-01-27 DIAGNOSIS — E782 Mixed hyperlipidemia: Secondary | ICD-10-CM

## 2023-01-27 DIAGNOSIS — I4892 Unspecified atrial flutter: Secondary | ICD-10-CM

## 2023-01-27 DIAGNOSIS — I4891 Unspecified atrial fibrillation: Secondary | ICD-10-CM | POA: Diagnosis not present

## 2023-01-27 MED ORDER — DILTIAZEM HCL ER 60 MG PO CP12
60.0000 mg | ORAL_CAPSULE | Freq: Two times a day (BID) | ORAL | 0 refills | Status: DC
Start: 2023-01-27 — End: 2023-02-09

## 2023-01-27 NOTE — Progress Notes (Signed)
Date:  01/27/2023   ID:  [name] [name], DOB 1940-12-04, MRN [id number]  PCP:  Lorenda Ishihara, MD  Cardiologist:  Tessa Lerner, DO, [facility] (established care 04/10/2021) Former Cardiologist: Marcina Millard, MD  Date: 01/27/23 Last Office Visit: 11/20/2022  Chief Complaint  Patient presents with   Atrial Fibrillation   Follow-up    HPI  [name] [name] is a 82 y.o. 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  Patient is being followed by the practice for paroxysmal atrial flutter management.  In the past he has had ED visits for symptomatic atrial fibrillation/flutter but by the time he was scheduled for TEE guided cardioversion he had spontaneous converted to normal sinus rhythm.  In May 2024 patient had an ED visit for palpitations and had to be cardioverted to sinus rhythm by the ED provider.  Thereafter he underwent a Zio patch to reevaluate his A-fib/atrial flutter burden.  Results reviewed with him and his daughter at today's office visit.  He denies anginal chest pain or heart failure symptoms.  He continues to have back pain and is on chronic pain medications.    He is accompanied by her daughter [name] Land today's office visit who provides collateral history.  ALLERGIES: Allergies  Allergen Reactions   Influenza Vaccines Anaphylaxis    Flu shot: 1974 anaphylaxis. 2nd time swollen arm   Influenza Virus Vaccine Other (See Comments)    MEDICATION LIST PRIOR TO VISIT: Current Meds  Medication Sig   acetaminophen (TYLENOL) 500 MG tablet Take 1 tablet (500 mg total) by mouth every 6 (six) hours as needed for moderate pain or mild pain.   apixaban (ELIQUIS) 5 MG TABS tablet Take 1 tablet (5 mg total)  by mouth 2 (two) times daily.   carbidopa-levodopa (SINEMET CR) 50-200 MG tablet Take 1 tablet by mouth at bedtime.   carbidopa-levodopa (SINEMET IR) 25-100 MG tablet Take 3 tablets at 7 AM, 2 tablets at 11 AM, 3 tablets at 3 PM, 2 tablets at 7 PM   clonazePAM (KLONOPIN) 0.5 MG tablet Take 0.5 mg by mouth at bedtime.   cyanocobalamin (VITAMIN B12) 1000 MCG tablet Take 1,000 mcg by mouth daily.   diltiazem (CARDIZEM SR) 60 MG 12 hr capsule Take 1 capsule (60 mg total) by mouth 2 (two) times daily. Hold if systolic blood pressure (top number) less than 110 mmHg or pulse less than 55 bpm.   Glycerin-Hypromellose-PEG 400 (VISINE DRY EYE OP) Place 1 drop into both eyes 2 (two) times daily as needed (eye irritation).    ketoconazole (NIZORAL) 2 % shampoo Apply 1 application  topically 3 (three) times a week.   leuprolide, 6 Month, (ELIGARD) 45 MG injection Inject 45 mg into the skin every 6 (six) months.   omeprazole (PRILOSEC) 20 MG capsule TAKE 1 CAPSULE DAILY   ondansetron (ZOFRAN) 8 MG tablet Take 1 tablet (8 mg total) by mouth 2 (two) times daily as needed (Nausea or vomiting). (Patient taking differently: Take 8 mg by mouth 2 (two) times daily as needed for vomiting or nausea.)   oxyCODONE (OXY IR/ROXICODONE) 5 MG immediate release tablet Take 0.5-1 tablets (2.5-5 mg total) by mouth every 12 (twelve) hours as needed for severe pain.   Pancrelipase, Lip-Prot-Amyl, (PANCREAZE) 37000-97300 units CPEP Take 2 capsules (74,000 Units total)  by mouth with breakfast, with lunch, and with evening meal. Take 2 capsules with each meal and 1 with snack   polyethylene glycol powder (MIRALAX) 17 GM/SCOOP powder Take 17 g by mouth in the morning and at bedtime.   prochlorperazine (COMPAZINE) 10 MG tablet Take 1 tablet (10 mg total) by mouth every 6 (six) hours as needed (Nausea or vomiting). (Patient taking differently: Take 10 mg by mouth every 6 (six) hours as needed for vomiting or nausea.)   rosuvastatin  (CRESTOR) 10 MG tablet TAKE 1 TABLET DAILY   tiZANidine (ZANAFLEX) 2 MG tablet Take 2 mg by mouth once.   triamcinolone cream (KENALOG) 0.1 % Apply 1 application topically 2 (two) times daily. (Patient taking differently: Apply 1 application  topically 2 (two) times daily as needed (itching).)   zoledronic acid (RECLAST) 5 MG/100ML SOLN injection Inject 5 mg into the vein See admin instructions. 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)    Prostate cancer metastatic to bone (HCC)    REM sleep behavior disorder    Sleep apnea    BiPap    PAST SURGICAL HISTORY: Past Surgical History:  Procedure Laterality Date   APPENDECTOMY  1963   BIOPSY  12/25/2021   Procedure: BIOPSY;  Surgeon: Lemar Lofty., MD;  Location: [facility] ENDOSCOPY;  Service: Gastroenterology;;   COLONOSCOPY  2010, 2015   ESOPHAGOGASTRODUODENOSCOPY N/A 04/16/2022   Procedure: ESOPHAGOGASTRODUODENOSCOPY (EGD);  Surgeon: Lemar Lofty., MD;  Location: [facility] ENDOSCOPY;  Service: Gastroenterology;  Laterality: N/A;   ESOPHAGOGASTRODUODENOSCOPY (EGD) WITH PROPOFOL N/A 12/25/2021   Procedure: ESOPHAGOGASTRODUODENOSCOPY (EGD) WITH PROPOFOL;  Surgeon: Meridee Score Netty Starring., MD;  Location: [facility] ENDOSCOPY;  Service: Gastroenterology;  Laterality: N/A;   EUS N/A 12/25/2021   Procedure: UPPER ENDOSCOPIC ULTRASOUND (EUS) RADIAL;  Surgeon: Lemar Lofty., MD;  Location: [facility] ENDOSCOPY;  Service: Gastroenterology;  Laterality: N/A;   EUS N/A 04/16/2022   Procedure: UPPER ENDOSCOPIC ULTRASOUND (EUS) RADIAL;  Surgeon: Lemar Lofty., MD;  Location: [facility] ENDOSCOPY;  Service:  Gastroenterology;  Laterality: N/A;   FIDUCIAL MARKER PLACEMENT N/A 04/16/2022   Procedure: FIDUCIAL MARKER PLACEMENT;  Surgeon: Lemar Lofty., MD;  Location: [facility] ENDOSCOPY;  Service: Gastroenterology;  Laterality: N/A;   FINE NEEDLE ASPIRATION  12/25/2021   Procedure: FINE NEEDLE ASPIRATION (FNA) LINEAR;  Surgeon: Lemar Lofty., MD;  Location: [facility] ENDOSCOPY;  Service: Gastroenterology;;   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   KYPHOPLASTY N/A 08/15/2020   Procedure: L2 compression fracture;  Surgeon: Kennedy Bucker, MD;  Location: [facility] ORS;  Service: Orthopedics;  Laterality: N/A;   KYPHOPLASTY N/A 08/29/2020   Procedure: T8 KYPHOPLASTY;  Surgeon: Kennedy Bucker, MD;  Location: [facility] ORS;  Service: Orthopedics;  Laterality: N/A;   KYPHOPLASTY N/A 11/12/2020   Procedure: L1 KYPHOPLASTY;  Surgeon: Kennedy Bucker, MD;  Location: [facility] ORS;  Service: Orthopedics;  Laterality: N/A;   PORTACATH PLACEMENT N/A 02/05/2022   Procedure: INSERTION PORT-A-CATH;  Surgeon: Fritzi Mandes, MD;  Location: [facility] ORS;  Service: General;  Laterality: N/A;    FAMILY HISTORY: The patient family history includes Breast cancer in his daughter; Diabetes  in his paternal grandfather; Heart disease in his maternal grandfather.  SOCIAL HISTORY:  The patient  reports that he quit smoking about 12 years ago. His smoking use included cigars. He has never used smokeless tobacco. He reports current alcohol use. He reports that he does not use drugs.  REVIEW OF SYSTEMS: Review of Systems  Constitutional: Positive for malaise/fatigue.  Cardiovascular:  Negative for chest pain, claudication, dyspnea on exertion, irregular heartbeat, leg swelling, near-syncope, orthopnea, palpitations, paroxysmal nocturnal dyspnea and syncope.  Respiratory:  Negative for shortness of breath.   Hematologic/Lymphatic: Negative for bleeding problem.  Musculoskeletal:  Positive for back pain. Negative for muscle cramps and myalgias.  Neurological:   Negative for dizziness and light-headedness.    PHYSICAL EXAM:    01/27/2023   12:46 PM 01/18/2023    1:45 PM 01/18/2023    1:30 PM  Vitals with BMI  Height 5\' 5"     Weight 154 lbs    BMI 25.63    Systolic 116 135 161  Diastolic 74 86 99  Pulse 54 58 58   Physical Exam  Constitutional: No distress. He appears chronically ill.   hemodynamically stable, ambulates w/ walker  Neck: No JVD present.  Cardiovascular: Regular rhythm, S1 normal, S2 normal, intact distal pulses and normal pulses. Bradycardia present. Exam reveals no gallop, no S3 and no S4.  Murmur heard. Holosystolic murmur is present with a grade of 3/6 at the apex. Pulses:      Dorsalis pedis pulses are 2+ on the right side and 2+ on the left side.       Posterior tibial pulses are 2+ on the right side and 2+ on the left side.  Pulmonary/Chest: Effort normal and breath sounds normal. No stridor. He has no wheezes. He has no rales.  Abdominal: Soft. Bowel sounds are normal. He exhibits no distension. There is no abdominal tenderness.  Musculoskeletal:        General: No edema.     Cervical back: Neck supple.  Neurological: He is alert and oriented to person, place, and time. He has intact cranial nerves (2-12).  Skin: Skin is warm and moist.    CARDIAC DATABASE: Cardioversion 01/18/2023: 150J x1  in ED   EKG: 10/26/2022: Atrial fibrillation, 88 bpm, RBBB, left axis.   11/20/2022: Sinus bradycardia, 52 bpm, compared to 10/26/2022 atrial fibrillation has now transition to sinus.  Echocardiogram: 04/30/2021: Normal LV systolic function with EF 63%. Moderate concentric hypertrophy of the left ventricle. Normal global wall motion. Left ventricle cavity is normal in size.  Doppler evidence of grade II (pseudonormal) diastolic dysfunction, elevated LAP.  Left atrial cavity is moderately dilated. Trileaflet aortic valve. Moderate (Grade II) aortic regurgitation. Mild to moderate mitral regurgitation. Mild tricuspid  regurgitation.  No evidence of pulmonary hypertension.   Stress Testing: Lexiscan Tetrofosmin stress test 04/30/2021: 1 Day Rest/Stress Protocol. Stress EKG is non-diagnostic for ischemia as its a pharmacologic stress test using Lexiscan. Normal myocardial perfusion without convincing evidence of reversible myocardial ischemia or prior infarct.   Left ventricular ejection fraction is 55% with normal wall motion.   Low risk study. No prior studies for comparison.   Heart Catheterization: None  Cardiac monitor [facility] Patch): December 23, 2022 -Jan 06, 2023 Dominant rhythm sinus, followed by atrial fibrillation/flutter (burden 7%). Overall heart rate 36-179 bpm. Avg HR 62 bpm. Sinus heart rate 36 - 119 bpm. Avg HR 58 bpm. No atrial fibrillation, high grade AV block, pauses (3 seconds or longer). Total ventricular ectopic burden <  1%. 1 episode of NSVT (auto triggered), on 12/24/2022 at 6:19 pm, 5 beats, 2.6 seconds duration, max HR 126 bpm.  Total supraventricular ectopic burden 4% (predominantly as isolated beats).  Atrial fibrillation/flutter burden 7% during the monitoring period.  Majority of the episodes were atrial flutter.  Fastest episode occurred on 01/02/2023 at 10:52 am max HR 179 bpm.  Longest episode of atrial flutter 22hr 56 min on 01/02/2023.  Patient triggered events: 3. Underlying rhythm sinus with occasional PACs.   LABORATORY DATA:    Latest Ref Rng & Units 01/18/2023    9:53 AM 01/15/2023    1:04 PM 12/22/2022    9:29 AM  CBC  WBC 4.0 - 10.5 K/uL 6.0  6.8  5.7   Hemoglobin 13.0 - 17.0 g/dL 16.1  09.6  04.5   Hematocrit 39.0 - 52.0 % 46.3  38.6  43.7   Platelets 150 - 400 K/uL 190  170  220        Latest Ref Rng & Units 01/18/2023    9:53 AM 01/15/2023    1:04 PM 12/22/2022    9:29 AM  CMP  Glucose 70 - 99 mg/dL 96  409  95   BUN 8 - 23 mg/dL 25  26  20    Creatinine 0.61 - 1.24 mg/dL 8.11  9.14  7.82   Sodium 135 - 145 mmol/L 141  141  139   Potassium 3.5 - 5.1  mmol/L 4.2  4.8  4.1   Chloride 98 - 111 mmol/L 106  109  103   CO2 22 - 32 mmol/L 25  28  27    Calcium 8.9 - 10.3 mg/dL 9.9  9.4  95.6   Total Protein 6.5 - 8.1 g/dL  6.6  6.4   Total Bilirubin 0.3 - 1.2 mg/dL  1.0  1.1   Alkaline Phos 38 - 126 U/L  53  49   AST 15 - 41 U/L  23  27   ALT 0 - 44 U/L  <5  <5     Lipid Panel     Component Value Date/Time   CHOL 124 12/04/2021 1359   TRIG 146 12/04/2021 1359   HDL 66 12/04/2021 1359   CHOLHDL 3.0 08/17/2019 0445   VLDL 20 08/17/2019 0445   LDLCALC 34 12/04/2021 1359   LDLDIRECT 43 12/04/2021 1359   LABVLDL 24 12/04/2021 1359    No components found for: "NTPROBNP" No results for input(s): "PROBNP" in the last 8760 hours. No results for input(s): "TSH" in the last 8760 hours.  BMP Recent Labs    12/22/22 0929 01/15/23 1304 01/18/23 0953  NA 139 141 141  K 4.1 4.8 4.2  CL 103 109 106  CO2 27 28 25   GLUCOSE 95 132* 96  BUN 20 26* 25*  CREATININE 1.27* 1.31* 1.24  CALCIUM 10.0 9.4 9.9  GFRNONAA 57* 55* 58*    HEMOGLOBIN A1C No results found for: "HGBA1C", "MPG"  IMPRESSION:    ICD-10-CM   1. Atrial fibrillation and flutter (HCC)  I48.91 EKG 12-Lead   I48.92 diltiazem (CARDIZEM SR) 60 MG 12 hr capsule    2. Long term (current) use of anticoagulants  Z79.01     3. Atherosclerosis of aorta (HCC)  I70.0     4. Mixed hyperlipidemia  E78.2     5. OSA treated with BiPAP  G47.33        RECOMMENDATIONS: [name] [name] is a 82 y.o. 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  Paroxysmal atrial flutter/fibrillation Predominantly atrial flutter Rate control: Diltiazem. Rhythm control: N/A. Thromboembolic prophylaxis: Eliquis [id number] SCORE is 3 which correlates to  3% risk of stroke per year (age, atherosclerosis).  Diagnosed in 2020. Status post cardioversion x 1 in May 2024 by ED provider. Zio patch notes atrial fibrillation/flutter burden to be around 7%. We discussed initiation of antiarrhythmics to help maintain sinus rhythm.  However, after discussing the medication profile he would like to continue with rate control strategy. He is currently on diltiazem short acting 30 mg p.o. twice daily.  Given his Parkinson disease his blood pressures are usually soft and at times he holds diltiazem.  Patient is reluctant to be on extended release diltiazem for now.  Shared decision was to increase short acting diltiazem from 30 mg p.o. twice daily to 60 mg p.o. twice daily with holding parameters.  Long term (current) use of anticoagulants Does not endorse evidence of bleeding. Reemphasized the risks, benefits, and alternatives to anticoagulation. Most recent labs from May 2024 independently reviewed.  Renal function and hemoglobin within acceptable limits.  Atherosclerosis of aorta (HCC) Chronic and stable. Currently on statin therapy.  Prostate cancer (HCC) Malignant neoplasm of pancreas, unspecified location of malignancy (HCC) Currently being followed by other providers and care team  Discussed management of at least 2 chronic comorbid conditions, independently reviewed prior EKGs/Zio patch results, discussed medical management regarding prior surgical atrial flutter, medication changes as noted above, will continue to see him in close follow-up  FINAL MEDICATION LIST END OF ENCOUNTER: Meds ordered this encounter  Medications   diltiazem (CARDIZEM SR) 60 MG 12 hr capsule    Sig: Take 1 capsule (60 mg total) by mouth 2 (two) times daily. Hold if systolic blood pressure (top number) less than 110 mmHg or pulse less than 55 bpm.    Dispense:  60 capsule    Refill:  0   Medications Discontinued During This Encounter  Medication Reason   diltiazem  (CARDIZEM) 30 MG tablet Dose change     Current Outpatient Medications:    acetaminophen (TYLENOL) 500 MG tablet, Take 1 tablet (500 mg total) by mouth every 6 (six) hours as needed for moderate pain or mild pain., Disp: 30 tablet, Rfl: 0   apixaban (ELIQUIS) 5 MG TABS tablet, Take 1 tablet (5 mg total) by mouth 2 (two) times daily., Disp: 120 tablet, Rfl: 3   carbidopa-levodopa (SINEMET CR) 50-200 MG tablet, Take 1 tablet by mouth at bedtime., Disp: 90 tablet, Rfl: 0   carbidopa-levodopa (SINEMET IR) 25-100 MG tablet, Take 3 tablets at 7 AM, 2 tablets at 11 AM, 3 tablets at 3 PM, 2 tablets at 7 PM, Disp: 900 tablet, Rfl: 0   clonazePAM (KLONOPIN) 0.5 MG tablet, Take 0.5 mg by mouth at bedtime., Disp: , Rfl:    cyanocobalamin (VITAMIN B12) 1000 MCG tablet, Take 1,000 mcg by mouth daily., Disp: , Rfl:    diltiazem (CARDIZEM SR) 60 MG 12 hr capsule, Take 1 capsule (60 mg total) by mouth 2 (two) times daily. Hold if systolic blood pressure (top number) less than 110 mmHg or pulse less than 55 bpm., Disp: 60 capsule, Rfl: 0   Glycerin-Hypromellose-PEG 400 (VISINE DRY EYE OP), Place 1 drop into both eyes 2 (two) times daily as needed (  eye irritation). , Disp: , Rfl:    ketoconazole (NIZORAL) 2 % shampoo, Apply 1 application  topically 3 (three) times a week., Disp: , Rfl:    leuprolide, 6 Month, (ELIGARD) 45 MG injection, Inject 45 mg into the skin every 6 (six) months., Disp: , Rfl:    omeprazole (PRILOSEC) 20 MG capsule, TAKE 1 CAPSULE DAILY, Disp: 90 capsule, Rfl: 3   ondansetron (ZOFRAN) 8 MG tablet, Take 1 tablet (8 mg total) by mouth 2 (two) times daily as needed (Nausea or vomiting). (Patient taking differently: Take 8 mg by mouth 2 (two) times daily as needed for vomiting or nausea.), Disp: 30 tablet, Rfl: 1   oxyCODONE (OXY IR/ROXICODONE) 5 MG immediate release tablet, Take 0.5-1 tablets (2.5-5 mg total) by mouth every 12 (twelve) hours as needed for severe pain., Disp: 30 tablet, Rfl: 0    Pancrelipase, Lip-Prot-Amyl, (PANCREAZE) 37000-97300 units CPEP, Take 2 capsules (74,000 Units total) by mouth with breakfast, with lunch, and with evening meal. Take 2 capsules with each meal and 1 with snack, Disp: 600 capsule, Rfl: 1   polyethylene glycol powder (MIRALAX) 17 GM/SCOOP powder, Take 17 g by mouth in the morning and at bedtime., Disp: , Rfl:    prochlorperazine (COMPAZINE) 10 MG tablet, Take 1 tablet (10 mg total) by mouth every 6 (six) hours as needed (Nausea or vomiting). (Patient taking differently: Take 10 mg by mouth every 6 (six) hours as needed for vomiting or nausea.), Disp: 30 tablet, Rfl: 1   rosuvastatin (CRESTOR) 10 MG tablet, TAKE 1 TABLET DAILY, Disp: 90 tablet, Rfl: 3   tiZANidine (ZANAFLEX) 2 MG tablet, Take 2 mg by mouth once., Disp: , Rfl:    triamcinolone cream (KENALOG) 0.1 %, Apply 1 application topically 2 (two) times daily. (Patient taking differently: Apply 1 application  topically 2 (two) times daily as needed (itching).), Disp: 453.6 g, Rfl: 1   zoledronic acid (RECLAST) 5 MG/100ML SOLN injection, Inject 5 mg into the vein See admin instructions. Once a year, Disp: , Rfl:   Orders Placed This Encounter  Procedures   EKG 12-Lead   There are no Patient Instructions on file for this visit.   --Continue cardiac medications as reconciled in final medication list. --Return in about 3 months (around 04/29/2023) for Follow up, Atrial flutter, A. fib, EKG on arrival.. Or sooner if needed. --Continue follow-up with your primary care physician regarding the management of your other chronic comorbid conditions.  Patient's questions and concerns were addressed to his satisfaction. He voices understanding of the instructions provided during this encounter.   This note was created using a voice recognition software as a result there may be grammatical errors inadvertently enclosed that do not reflect the nature of this encounter. Every attempt is made to correct such  errors.  Tessa Lerner, [place], [facility]  Pager:  [phone] Office: [phone]

## 2023-01-29 DIAGNOSIS — M47816 Spondylosis without myelopathy or radiculopathy, lumbar region: Secondary | ICD-10-CM | POA: Diagnosis not present

## 2023-02-02 DIAGNOSIS — C7951 Secondary malignant neoplasm of bone: Secondary | ICD-10-CM | POA: Diagnosis not present

## 2023-02-02 DIAGNOSIS — G20B1 Parkinson's disease with dyskinesia, without mention of fluctuations: Secondary | ICD-10-CM | POA: Diagnosis not present

## 2023-02-02 DIAGNOSIS — G4752 REM sleep behavior disorder: Secondary | ICD-10-CM | POA: Diagnosis not present

## 2023-02-02 DIAGNOSIS — R54 Age-related physical debility: Secondary | ICD-10-CM | POA: Diagnosis not present

## 2023-02-02 DIAGNOSIS — I4892 Unspecified atrial flutter: Secondary | ICD-10-CM | POA: Diagnosis not present

## 2023-02-02 DIAGNOSIS — C61 Malignant neoplasm of prostate: Secondary | ICD-10-CM | POA: Diagnosis not present

## 2023-02-02 DIAGNOSIS — M810 Age-related osteoporosis without current pathological fracture: Secondary | ICD-10-CM | POA: Diagnosis not present

## 2023-02-02 DIAGNOSIS — Z23 Encounter for immunization: Secondary | ICD-10-CM | POA: Diagnosis not present

## 2023-02-03 ENCOUNTER — Inpatient Hospital Stay (HOSPITAL_COMMUNITY)
Admission: EM | Admit: 2023-02-03 | Discharge: 2023-02-09 | DRG: 522 | Disposition: A | Discharge status: Skilled Nursing Facility | Payer: Medicare Other | Attending: Internal Medicine | Admitting: Internal Medicine

## 2023-02-03 ENCOUNTER — Emergency Department (HOSPITAL_COMMUNITY): Payer: Medicare Other

## 2023-02-03 ENCOUNTER — Other Ambulatory Visit: Payer: Self-pay

## 2023-02-03 DIAGNOSIS — S72001D Fracture of unspecified part of neck of right femur, subsequent encounter for closed fracture with routine healing: Secondary | ICD-10-CM | POA: Diagnosis not present

## 2023-02-03 DIAGNOSIS — Z87891 Personal history of nicotine dependence: Secondary | ICD-10-CM | POA: Diagnosis not present

## 2023-02-03 DIAGNOSIS — E871 Hypo-osmolality and hyponatremia: Secondary | ICD-10-CM | POA: Diagnosis present

## 2023-02-03 DIAGNOSIS — E872 Acidosis, unspecified: Secondary | ICD-10-CM | POA: Diagnosis present

## 2023-02-03 DIAGNOSIS — D62 Acute posthemorrhagic anemia: Secondary | ICD-10-CM | POA: Diagnosis not present

## 2023-02-03 DIAGNOSIS — C61 Malignant neoplasm of prostate: Secondary | ICD-10-CM | POA: Diagnosis not present

## 2023-02-03 DIAGNOSIS — G4733 Obstructive sleep apnea (adult) (pediatric): Secondary | ICD-10-CM | POA: Diagnosis not present

## 2023-02-03 DIAGNOSIS — S72001A Fracture of unspecified part of neck of right femur, initial encounter for closed fracture: Principal | ICD-10-CM | POA: Diagnosis present

## 2023-02-03 DIAGNOSIS — I700 Atherosclerosis of aorta: Secondary | ICD-10-CM | POA: Diagnosis present

## 2023-02-03 DIAGNOSIS — Z0389 Encounter for observation for other suspected diseases and conditions ruled out: Secondary | ICD-10-CM | POA: Diagnosis not present

## 2023-02-03 DIAGNOSIS — Z887 Allergy status to serum and vaccine status: Secondary | ICD-10-CM | POA: Diagnosis not present

## 2023-02-03 DIAGNOSIS — Z905 Acquired absence of kidney: Secondary | ICD-10-CM | POA: Diagnosis not present

## 2023-02-03 DIAGNOSIS — Z524 Kidney donor: Secondary | ICD-10-CM

## 2023-02-03 DIAGNOSIS — D759 Disease of blood and blood-forming organs, unspecified: Secondary | ICD-10-CM | POA: Diagnosis not present

## 2023-02-03 DIAGNOSIS — I4510 Unspecified right bundle-branch block: Secondary | ICD-10-CM | POA: Diagnosis present

## 2023-02-03 DIAGNOSIS — G8911 Acute pain due to trauma: Secondary | ICD-10-CM | POA: Diagnosis not present

## 2023-02-03 DIAGNOSIS — S7291XA Unspecified fracture of right femur, initial encounter for closed fracture: Secondary | ICD-10-CM | POA: Diagnosis present

## 2023-02-03 DIAGNOSIS — R7401 Elevation of levels of liver transaminase levels: Secondary | ICD-10-CM | POA: Diagnosis not present

## 2023-02-03 DIAGNOSIS — D649 Anemia, unspecified: Secondary | ICD-10-CM | POA: Diagnosis not present

## 2023-02-03 DIAGNOSIS — N400 Enlarged prostate without lower urinary tract symptoms: Secondary | ICD-10-CM | POA: Diagnosis present

## 2023-02-03 DIAGNOSIS — Z8249 Family history of ischemic heart disease and other diseases of the circulatory system: Secondary | ICD-10-CM | POA: Diagnosis not present

## 2023-02-03 DIAGNOSIS — Z803 Family history of malignant neoplasm of breast: Secondary | ICD-10-CM

## 2023-02-03 DIAGNOSIS — W19XXXD Unspecified fall, subsequent encounter: Secondary | ICD-10-CM | POA: Diagnosis not present

## 2023-02-03 DIAGNOSIS — E538 Deficiency of other specified B group vitamins: Secondary | ICD-10-CM | POA: Diagnosis not present

## 2023-02-03 DIAGNOSIS — Z833 Family history of diabetes mellitus: Secondary | ICD-10-CM

## 2023-02-03 DIAGNOSIS — Z7901 Long term (current) use of anticoagulants: Secondary | ICD-10-CM

## 2023-02-03 DIAGNOSIS — M25551 Pain in right hip: Secondary | ICD-10-CM | POA: Diagnosis not present

## 2023-02-03 DIAGNOSIS — Z7401 Bed confinement status: Secondary | ICD-10-CM | POA: Diagnosis not present

## 2023-02-03 DIAGNOSIS — Z9989 Dependence on other enabling machines and devices: Secondary | ICD-10-CM | POA: Diagnosis not present

## 2023-02-03 DIAGNOSIS — Z79899 Other long term (current) drug therapy: Secondary | ICD-10-CM | POA: Diagnosis not present

## 2023-02-03 DIAGNOSIS — E785 Hyperlipidemia, unspecified: Secondary | ICD-10-CM | POA: Diagnosis not present

## 2023-02-03 DIAGNOSIS — C259 Malignant neoplasm of pancreas, unspecified: Secondary | ICD-10-CM | POA: Diagnosis present

## 2023-02-03 DIAGNOSIS — Z9221 Personal history of antineoplastic chemotherapy: Secondary | ICD-10-CM

## 2023-02-03 DIAGNOSIS — Z01818 Encounter for other preprocedural examination: Secondary | ICD-10-CM | POA: Diagnosis not present

## 2023-02-03 DIAGNOSIS — W010XXA Fall on same level from slipping, tripping and stumbling without subsequent striking against object, initial encounter: Secondary | ICD-10-CM | POA: Diagnosis present

## 2023-02-03 DIAGNOSIS — I480 Paroxysmal atrial fibrillation: Secondary | ICD-10-CM | POA: Diagnosis not present

## 2023-02-03 DIAGNOSIS — E8809 Other disorders of plasma-protein metabolism, not elsewhere classified: Secondary | ICD-10-CM | POA: Diagnosis not present

## 2023-02-03 DIAGNOSIS — I4891 Unspecified atrial fibrillation: Secondary | ICD-10-CM | POA: Diagnosis not present

## 2023-02-03 DIAGNOSIS — D72829 Elevated white blood cell count, unspecified: Secondary | ICD-10-CM | POA: Diagnosis present

## 2023-02-03 DIAGNOSIS — R000 Tachycardia, unspecified: Secondary | ICD-10-CM | POA: Diagnosis not present

## 2023-02-03 DIAGNOSIS — C7951 Secondary malignant neoplasm of bone: Secondary | ICD-10-CM | POA: Diagnosis not present

## 2023-02-03 DIAGNOSIS — Z981 Arthrodesis status: Secondary | ICD-10-CM | POA: Diagnosis not present

## 2023-02-03 DIAGNOSIS — G20A1 Parkinson's disease without dyskinesia, without mention of fluctuations: Secondary | ICD-10-CM | POA: Diagnosis present

## 2023-02-03 DIAGNOSIS — S72041A Displaced fracture of base of neck of right femur, initial encounter for closed fracture: Secondary | ICD-10-CM | POA: Diagnosis not present

## 2023-02-03 DIAGNOSIS — I10 Essential (primary) hypertension: Secondary | ICD-10-CM | POA: Diagnosis not present

## 2023-02-03 DIAGNOSIS — G4730 Sleep apnea, unspecified: Secondary | ICD-10-CM | POA: Diagnosis not present

## 2023-02-03 DIAGNOSIS — W19XXXA Unspecified fall, initial encounter: Secondary | ICD-10-CM | POA: Diagnosis not present

## 2023-02-03 DIAGNOSIS — K219 Gastro-esophageal reflux disease without esophagitis: Secondary | ICD-10-CM | POA: Diagnosis present

## 2023-02-03 DIAGNOSIS — Z96641 Presence of right artificial hip joint: Secondary | ICD-10-CM | POA: Diagnosis not present

## 2023-02-03 DIAGNOSIS — I440 Atrioventricular block, first degree: Secondary | ICD-10-CM | POA: Diagnosis present

## 2023-02-03 DIAGNOSIS — M25572 Pain in left ankle and joints of left foot: Secondary | ICD-10-CM | POA: Diagnosis not present

## 2023-02-03 LAB — BASIC METABOLIC PANEL
Anion gap: 9 (ref 5–15)
BUN: 17 mg/dL (ref 8–23)
CO2: 24 mmol/L (ref 22–32)
Calcium: 8.9 mg/dL (ref 8.9–10.3)
Chloride: 104 mmol/L (ref 98–111)
Creatinine, Ser: 1.11 mg/dL (ref 0.61–1.24)
GFR, Estimated: >60 mL/min (ref >60)
Glucose, Bld: 114 mg/dL — ABNORMAL HIGH (ref 70–99)
Potassium: 3.8 mmol/L (ref 3.5–5.1)
Sodium: 137 mmol/L (ref 135–145)

## 2023-02-03 LAB — CBC WITH DIFFERENTIAL/PLATELET
Abs Immature Granulocytes: 0.03 10*3/uL (ref 0.00–0.07)
Basophils Absolute: 0 10*3/uL (ref 0.0–0.1)
Basophils Relative: 1 %
Eosinophils Absolute: 0.2 10*3/uL (ref 0.0–0.5)
Eosinophils Relative: 3 %
HCT: 39.8 % (ref 39.0–52.0)
Hemoglobin: 12.5 g/dL — ABNORMAL LOW (ref 13.0–17.0)
Immature Granulocytes: 1 %
Lymphocytes Relative: 14 %
Lymphs Abs: 0.8 10*3/uL (ref 0.7–4.0)
MCH: 29.6 pg (ref 26.0–34.0)
MCHC: 31.4 g/dL (ref 30.0–36.0)
MCV: 94.3 fL (ref 80.0–100.0)
Monocytes Absolute: 0.8 10*3/uL (ref 0.1–1.0)
Monocytes Relative: 13 %
Neutro Abs: 4 10*3/uL (ref 1.7–7.7)
Neutrophils Relative %: 68 %
Platelets: 180 10*3/uL (ref 150–400)
RBC: 4.22 MIL/uL (ref 4.22–5.81)
RDW: 13.7 % (ref 11.5–15.5)
WBC: 5.8 10*3/uL (ref 4.0–10.5)
nRBC: 0 % (ref 0.0–0.2)

## 2023-02-03 LAB — PROTIME-INR
INR: 1.2 (ref 0.8–1.2)
Prothrombin Time: 15 seconds (ref 11.4–15.2)

## 2023-02-03 LAB — TYPE AND SCREEN
ABO/RH(D): O POS
Antibody Screen: NEGATIVE

## 2023-02-03 MED ORDER — ACETAMINOPHEN 325 MG PO TABS
650.0000 mg | ORAL_TABLET | Freq: Four times a day (QID) | ORAL | Status: DC | PRN
  Administered 2023-02-03 – 2023-02-09 (×4): 650 mg via ORAL
  Filled 2023-02-03 (×4): qty 2

## 2023-02-03 MED ORDER — PANCRELIPASE (LIP-PROT-AMYL) 36000-114000 UNITS PO CPEP
72000.0000 [IU] | ORAL_CAPSULE | Freq: Three times a day (TID) | ORAL | Status: DC
  Administered 2023-02-03 – 2023-02-09 (×14): 72000 [IU] via ORAL
  Filled 2023-02-03 (×19): qty 2

## 2023-02-03 MED ORDER — ONDANSETRON HCL 4 MG PO TABS: 4.0000 mg | ORAL_TABLET | Freq: Four times a day (QID) | ORAL | Status: DC | PRN

## 2023-02-03 MED ORDER — TRAZODONE HCL 50 MG PO TABS
25.0000 mg | ORAL_TABLET | Freq: Every evening | ORAL | Status: DC | PRN
  Administered 2023-02-04: 25 mg via ORAL
  Filled 2023-02-03: qty 1

## 2023-02-03 MED ORDER — CLONAZEPAM 0.5 MG PO TABS
0.5000 mg | ORAL_TABLET | Freq: Every day | ORAL | Status: DC
  Administered 2023-02-03 – 2023-02-08 (×6): 0.5 mg via ORAL
  Filled 2023-02-03 (×6): qty 1

## 2023-02-03 MED ORDER — CARBIDOPA-LEVODOPA ER 50-200 MG PO TBCR
1.0000 | EXTENDED_RELEASE_TABLET | Freq: Every day | ORAL | Status: DC
  Administered 2023-02-03 – 2023-02-08 (×6): 1 via ORAL
  Filled 2023-02-03 (×7): qty 1

## 2023-02-03 MED ORDER — PANTOPRAZOLE SODIUM 40 MG PO TBEC
40.0000 mg | DELAYED_RELEASE_TABLET | Freq: Every day | ORAL | Status: DC
  Administered 2023-02-04 – 2023-02-09 (×6): 40 mg via ORAL
  Filled 2023-02-03 (×6): qty 1

## 2023-02-03 MED ORDER — GLYCERIN-HYPROMELLOSE-PEG 400 0.2-0.2-1 % OP SOLN: 1.0000 [drp] | Freq: Two times a day (BID) | OPHTHALMIC | Status: DC | PRN

## 2023-02-03 MED ORDER — POLYETHYLENE GLYCOL 3350 17 G PO PACK
17.0000 g | PACK | Freq: Every day | ORAL | Status: DC | PRN
  Administered 2023-02-05 – 2023-02-06 (×2): 17 g via ORAL
  Filled 2023-02-03 (×2): qty 1

## 2023-02-03 MED ORDER — NOREPINEPHRINE 4 MG/250ML-% IV SOLN
INTRAVENOUS | Status: AC
  Filled 2023-02-03: qty 250

## 2023-02-03 MED ORDER — METOPROLOL TARTRATE 5 MG/5ML IV SOLN
5.0000 mg | Freq: Four times a day (QID) | INTRAVENOUS | Status: DC | PRN
  Administered 2023-02-06: 5 mg via INTRAVENOUS
  Filled 2023-02-03: qty 5

## 2023-02-03 MED ORDER — ONDANSETRON HCL 4 MG/2ML IJ SOLN: 4.0000 mg | Freq: Four times a day (QID) | INTRAMUSCULAR | Status: DC | PRN

## 2023-02-03 MED ORDER — VITAMIN B-12 1000 MCG PO TABS
1000.0000 ug | ORAL_TABLET | Freq: Every day | ORAL | Status: DC
  Administered 2023-02-04 – 2023-02-09 (×6): 1000 ug via ORAL
  Filled 2023-02-03 (×6): qty 1

## 2023-02-03 MED ORDER — OXYCODONE HCL 5 MG PO TABS
2.5000 mg | ORAL_TABLET | Freq: Two times a day (BID) | ORAL | Status: DC | PRN
  Administered 2023-02-03 – 2023-02-04 (×2): 2.5 mg via ORAL
  Filled 2023-02-03 (×2): qty 1

## 2023-02-03 MED ORDER — DOCUSATE SODIUM 100 MG PO CAPS
100.0000 mg | ORAL_CAPSULE | Freq: Two times a day (BID) | ORAL | Status: DC
  Administered 2023-02-03 – 2023-02-08 (×9): 100 mg via ORAL
  Filled 2023-02-03 (×9): qty 1

## 2023-02-03 MED ORDER — ALBUTEROL SULFATE (2.5 MG/3ML) 0.083% IN NEBU: 2.5000 mg | INHALATION_SOLUTION | RESPIRATORY_TRACT | Status: DC | PRN

## 2023-02-03 MED ORDER — CARBIDOPA-LEVODOPA 25-100 MG PO TABS
2.0000 | ORAL_TABLET | Freq: Two times a day (BID) | ORAL | Status: DC
  Administered 2023-02-03 – 2023-02-09 (×10): 2 via ORAL
  Filled 2023-02-03 (×12): qty 2

## 2023-02-03 MED ORDER — CARBIDOPA-LEVODOPA 25-100 MG PO TABS
3.0000 | ORAL_TABLET | Freq: Two times a day (BID) | ORAL | Status: DC
  Administered 2023-02-04 – 2023-02-09 (×11): 3 via ORAL
  Filled 2023-02-03 (×13): qty 3

## 2023-02-03 MED ORDER — ACETAMINOPHEN 650 MG RE SUPP: 650.0000 mg | Freq: Four times a day (QID) | RECTAL | Status: DC | PRN

## 2023-02-03 MED ORDER — DILTIAZEM HCL ER 60 MG PO CP12
60.0000 mg | ORAL_CAPSULE | Freq: Two times a day (BID) | ORAL | Status: DC
  Administered 2023-02-03 – 2023-02-04 (×2): 60 mg via ORAL
  Filled 2023-02-03 (×6): qty 1

## 2023-02-03 MED ORDER — ENOXAPARIN SODIUM 40 MG/0.4ML IJ SOSY
40.0000 mg | PREFILLED_SYRINGE | INTRAMUSCULAR | Status: DC
  Administered 2023-02-03 – 2023-02-04 (×2): 40 mg via SUBCUTANEOUS
  Filled 2023-02-03 (×2): qty 0.4

## 2023-02-03 MED ORDER — ROSUVASTATIN CALCIUM 5 MG PO TABS
10.0000 mg | ORAL_TABLET | Freq: Every day | ORAL | Status: DC
  Administered 2023-02-03 – 2023-02-09 (×7): 10 mg via ORAL
  Filled 2023-02-03 (×7): qty 2

## 2023-02-03 MED ORDER — HYDROMORPHONE HCL 1 MG/ML IJ SOLN
0.5000 mg | INTRAMUSCULAR | Status: AC | PRN
  Administered 2023-02-03 (×2): 0.5 mg via INTRAVENOUS
  Filled 2023-02-03 (×2): qty 1

## 2023-02-03 NOTE — ED Triage Notes (Signed)
Patient slipped in kitchen and fell, sustaining injury to R hip/leg. Shortening and rotation noted to same. PMS intact. Did not hit head, no LOC. +blood thinner.

## 2023-02-03 NOTE — Progress Notes (Signed)
Patient dw EDP.  Will need right hip arthroplasty.  Due to eliquis and OR availability will need to coordinate surgery with daytime team tomorrow.  Will have full consultation to come in am.  For now ok to eat until MN.

## 2023-02-03 NOTE — ED Provider Notes (Signed)
[place] [facility]ERGENC[facility] DEPARTMENT AT [facility], [place] Provider Note   CSN: [id number] Arrival date & time: 02/03/23  1331     History  Chief Complaint  Patient presents with   [name] [name] is a 82 y.o. male.  HPI 82 year old male presents after a fall and right hip injury.  He was walking with a tray in his hands and was walking with socks and slipped on the floor.  Landed on his right hip.  He did not hit his head.  He tried to get up but could not stand on his leg.  Denies any weakness or numbness.  He is on Eliquis for A-fib and last took this this morning.  Home Medications Prior to Admission medications   Medication Sig Start Date End Date Taking? Authorizing Provider  acetaminophen (TYLENOL) 500 MG tablet Take 1 tablet (500 mg total) by mouth every 6 (six) hours as needed for moderate pain or mild pain. 04/18/22   Mansouraty, Netty Starring., MD  apixaban (ELIQUIS) 5 MG TABS tablet Take 1 tablet (5 mg total) by mouth 2 (two) times daily. 06/18/22   Tolia, Sunit, DO  carbidopa-levodopa (SINEMET CR) 50-200 MG tablet Take 1 tablet by mouth at bedtime. 12/24/22   Tat, Octaviano Batty, DO  carbidopa-levodopa (SINEMET IR) 25-100 MG tablet Take 3 tablets at 7 AM, 2 tablets at 11 AM, 3 tablets at 3 PM, 2 tablets at 7 PM 12/24/22   Tat, Lurena Joiner S, DO  clonazePAM (KLONOPIN) 0.5 MG tablet Take 0.5 mg by mouth at bedtime. 02/17/21   [provider]  cyanocobalamin (VITAMIN B12) 1000 MCG tablet Take 1,000 mcg by mouth daily.    [provider]  diltiazem (CARDIZEM SR) 60 MG 12 hr capsule Take 1 capsule (60 mg total) by mouth 2 (two) times daily. Hold if systolic blood pressure (top number) less than 110 mmHg or pulse less than 55 bpm. 01/27/23 02/26/23  Tolia, Sunit, DO  Glycerin-Hypromellose-PEG 400 (VISINE DRY EYE OP) Place 1 drop into both eyes 2 (two) times daily as needed (eye irritation).     [provider]  ketoconazole (NIZORAL) 2 % shampoo Apply 1  application  topically 3 (three) times a week. 02/15/19   [provider]  leuprolide, 6 Month, (ELIGARD) 45 MG injection Inject 45 mg into the skin every 6 (six) months.    [provider]  omeprazole (PRILOSEC) 20 MG capsule TAKE 1 CAPSULE DAILY 09/09/22   Malachy Mood, MD  ondansetron (ZOFRAN) 8 MG tablet Take 1 tablet (8 mg total) by mouth 2 (two) times daily as needed (Nausea or vomiting). Patient taking differently: Take 8 mg by mouth 2 (two) times daily as needed for vomiting or nausea. 01/05/22   Malachy Mood, MD  oxyCODONE (OXY IR/ROXICODONE) 5 MG immediate release tablet Take 0.5-1 tablets (2.5-5 mg total) by mouth every 12 (twelve) hours as needed for severe pain. 12/31/22   Malachy Mood, MD  Pancrelipase, Lip-Prot-Amyl, [facility]) [phone] units CPEP Take 2 capsules (74,000 Units total) by mouth with breakfast, with lunch, and with evening meal. Take 2 capsules with each meal and 1 with snack 10/26/22   Mansouraty, Netty Starring., MD  polyethylene glycol powder (MIRALAX) 17 GM/SCOOP powder Take 17 g by mouth in the morning and at bedtime.    [provider]  prochlorperazine (COMPAZINE) 10 MG tablet Take 1 tablet (10 mg total) by mouth every 6 (six) hours as needed (Nausea or vomiting). Patient taking differently: Take  10 mg by mouth every 6 (six) hours as needed for vomiting or nausea. 01/05/22   Malachy Mood, MD  rosuvastatin (CRESTOR) 10 MG tablet TAKE 1 TABLET DAILY 06/17/22   Tolia, Sunit, DO  tiZANidine (ZANAFLEX) 2 MG tablet Take 2 mg by mouth once. 10/26/22   [provider]  triamcinolone cream (KENALOG) 0.1 % Apply 1 application topically 2 (two) times daily. Patient taking differently: Apply 1 application  topically 2 (two) times daily as needed (itching). 01/16/21   Duanne Limerick, MD  zoledronic acid (RECLAST) 5 MG/100ML SOLN injection Inject 5 mg into the vein See admin instructions. Once a year    [provider]      Allergies    Influenza  vaccines and Influenza virus vaccine    Review of Systems   Review of Systems  Cardiovascular:  Negative for chest pain.  Gastrointestinal:  Negative for abdominal pain.  Genitourinary:  Negative for dysuria.  Musculoskeletal:  Positive for arthralgias. Negative for back pain and neck pain.  Neurological:  Negative for dizziness, numbness and headaches.    Physical Exam Updated Vital Signs BP 126/69   Pulse (!) 57   Temp 98.1 F (36.7 C)   Resp 18   SpO2 100%  Physical Exam Vitals and nursing note reviewed.  Constitutional:      Appearance: He is well-developed.  HENT:     Head: Normocephalic and atraumatic.  Cardiovascular:     Rate and Rhythm: Normal rate and regular rhythm.     Pulses:          Dorsalis pedis pulses are 2+ on the right side.  Pulmonary:     Effort: Pulmonary effort is normal.  Abdominal:     General: There is no distension.     Palpations: Abdomen is soft.     Tenderness: There is no abdominal tenderness.  Musculoskeletal:     Right hip: Deformity and tenderness present. Decreased range of motion.     Right upper leg: No tenderness.     Right knee: Normal range of motion. No tenderness.     Right lower leg: No tenderness.     Right ankle: No tenderness.     Right foot: No tenderness.     Comments: Grossly normal sensation and movement in right foot.  Skin:    General: Skin is warm and dry.  Neurological:     Mental Status: He is alert.     ED Results / Procedures / Treatments   Labs (all labs ordered are listed, but only abnormal results are displayed) Labs Reviewed  BASIC METABOLIC PANEL  CBC WITH DIFFERENTIAL/PLATELET  PROTIME-INR  TYPE AND SCREEN    EKG None  Radiology No results found.  Procedures Procedures    Medications Ordered in ED Medications  HYDROmorphone (DILAUDID) injection 0.5 mg (has no administration in time range)    ED Course/ Medical Decision Making/ A&P                             Medical Decision  Making Amount and/or Complexity of Data Reviewed Labs: ordered. Radiology: ordered.  Risk Prescription drug management.   Patient appears to have a hip fracture. NV intact. Will get xray, labs. Given IV dilaudid for pain. These are all currently pending. No head injury. Care to Dr. Fredderick Phenix.        Final Clinical Impression(s) / ED Diagnoses Final diagnoses:  None    Rx /  DC Orders ED Discharge Orders     None         Pricilla Loveless, MD 02/03/23 [phone]

## 2023-02-03 NOTE — ED Provider Notes (Signed)
Care was taken over from Dr. Criss Alvine.  Patient had a mechanical fall, slipping on a wooden floor and has a right hip fracture.  X-rays reveal right femoral neck fracture.  This was interpreted by me.  Labs reviewed.Marland Kitchen  He is on Eliquis but denies hitting his head.  He last took his Eliquis this morning.  I spoke with Dr. Aundria Rud with orthopedic surgery who will see the patient and plan on fixation once it safe from an Eliquis perspective.  I discussed with the hospitalist to admit the patient for further treatment.   Rolan Bucco, MD 02/03/23 [phone]

## 2023-02-03 NOTE — ED Notes (Signed)
Informed RN of patient's complaint of pain and heart monitor alerts.

## 2023-02-03 NOTE — H&P (Signed)
History and Physical  [name] [name] DGL:[id number] DOB: Aug 09, 1941 DOA: 02/03/2023  PCP: Lorenda Ishihara, MD   Chief Complaint: Fall, right hip pain  HPI: [name] [name] is a 82 y.o. male with medical history significant for prostate cancer, pancreatic cancer, paroxysmal atrial fibrillation on Eliquis being admitted to the hospital with traumatic right hip fracture.  Patient has been in his usual state of health until this morning, when he fell on a wooden floor at home while wearing socks.  No loss of consciousness, did not hit his head.  X-ray in the emergency department confirmed displaced closed right femoral neck fracture.  ER provider discussed with EDP, who recommends hospital admission.  Will plan surgical repair once Xarelto has washed out.  Review of Systems: Please see HPI for pertinent positives and negatives. A complete 10 system review of systems are otherwise negative.  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 [facility])    Prostate cancer metastatic to bone (HCC)    REM sleep behavior disorder    Sleep apnea    BiPap   Past Surgical History:  Procedure Laterality Date   APPENDECTOMY  1963   BIOPSY  12/25/2021   Procedure: BIOPSY;  Surgeon: Lemar Lofty., MD;  Location: [facility] ENDOSCOPY;  Service: Gastroenterology;;   COLONOSCOPY  2010, 2015   ESOPHAGOGASTRODUODENOSCOPY N/A 04/16/2022   Procedure: ESOPHAGOGASTRODUODENOSCOPY (EGD);  Surgeon: Lemar Lofty., MD;  Location: [facility] ENDOSCOPY;  Service: Gastroenterology;  Laterality: N/A;   ESOPHAGOGASTRODUODENOSCOPY (EGD) WITH PROPOFOL N/A 12/25/2021   Procedure: ESOPHAGOGASTRODUODENOSCOPY (EGD) WITH PROPOFOL;  Surgeon: Meridee Score Netty Starring., MD;  Location: [facility] ENDOSCOPY;  Service:  Gastroenterology;  Laterality: N/A;   EUS N/A 12/25/2021   Procedure: UPPER ENDOSCOPIC ULTRASOUND (EUS) RADIAL;  Surgeon: Lemar Lofty., MD;  Location: [facility] ENDOSCOPY;  Service: Gastroenterology;  Laterality: N/A;   EUS N/A 04/16/2022   Procedure: UPPER ENDOSCOPIC ULTRASOUND (EUS) RADIAL;  Surgeon: Lemar Lofty., MD;  Location: [facility] ENDOSCOPY;  Service: Gastroenterology;  Laterality: N/A;   FIDUCIAL MARKER PLACEMENT N/A 04/16/2022   Procedure: FIDUCIAL MARKER PLACEMENT;  Surgeon: Lemar Lofty., MD;  Location: [facility] ENDOSCOPY;  Service: Gastroenterology;  Laterality: N/A;   FINE NEEDLE ASPIRATION  12/25/2021   Procedure: FINE NEEDLE ASPIRATION (FNA) LINEAR;  Surgeon: Lemar Lofty., MD;  Location: [facility] ENDOSCOPY;  Service: Gastroenterology;;   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   KYPHOPLASTY N/A 08/15/2020   Procedure: L2 compression fracture;  Surgeon: Kennedy Bucker, MD;  Location: [facility] ORS;  Service: Orthopedics;  Laterality: N/A;   KYPHOPLASTY N/A 08/29/2020   Procedure: T8 KYPHOPLASTY;  Surgeon: Kennedy Bucker, MD;  Location: [facility] ORS;  Service: Orthopedics;  Laterality: N/A;   KYPHOPLASTY N/A 11/12/2020   Procedure: L1 KYPHOPLASTY;  Surgeon: Kennedy Bucker, MD;  Location: [facility] ORS;  Service: Orthopedics;  Laterality: N/A;   PORTACATH PLACEMENT N/A 02/05/2022   Procedure: INSERTION PORT-A-CATH;  Surgeon: Fritzi Mandes, MD;  Location: [facility] ORS;  Service: General;  Laterality: N/A;    Social History:  reports that he quit smoking about 12 years ago. His smoking use included cigars. He has never used smokeless tobacco. He reports current alcohol use. He reports that he does not use drugs.   Allergies  Allergen Reactions   Influenza Vaccines  Anaphylaxis    Flu shot: 1974 anaphylaxis. 2nd time swollen arm   Influenza Virus Vaccine Other (See Comments)    Family History  Problem Relation Age of Onset   Heart disease Maternal Grandfather    Diabetes Paternal Grandfather     Breast cancer Daughter      Prior to Admission medications   Medication Sig Start Date End Date Taking? Authorizing Provider  acetaminophen (TYLENOL) 500 MG tablet Take 1 tablet (500 mg total) by mouth every 6 (six) hours as needed for moderate pain or mild pain. 04/18/22   Mansouraty, Netty Starring., MD  apixaban (ELIQUIS) 5 MG TABS tablet Take 1 tablet (5 mg total) by mouth 2 (two) times daily. 06/18/22   Tolia, Sunit, DO  carbidopa-levodopa (SINEMET CR) 50-200 MG tablet Take 1 tablet by mouth at bedtime. 12/24/22   Tat, Octaviano Batty, DO  carbidopa-levodopa (SINEMET IR) 25-100 MG tablet Take 3 tablets at 7 AM, 2 tablets at 11 AM, 3 tablets at 3 PM, 2 tablets at 7 PM 12/24/22   Tat, Lurena Joiner S, DO  clonazePAM (KLONOPIN) 0.5 MG tablet Take 0.5 mg by mouth at bedtime. 02/17/21   [provider]  cyanocobalamin (VITAMIN B12) 1000 MCG tablet Take 1,000 mcg by mouth daily.    [provider]  diltiazem (CARDIZEM SR) 60 MG 12 hr capsule Take 1 capsule (60 mg total) by mouth 2 (two) times daily. Hold if systolic blood pressure (top number) less than 110 mmHg or pulse less than 55 bpm. 01/27/23 02/26/23  Tolia, Sunit, DO  Glycerin-Hypromellose-PEG 400 (VISINE DRY EYE OP) Place 1 drop into both eyes 2 (two) times daily as needed (eye irritation).     [provider]  ketoconazole (NIZORAL) 2 % shampoo Apply 1 application  topically 3 (three) times a week. 02/15/19   [provider]  leuprolide, 6 Month, (ELIGARD) 45 MG injection Inject 45 mg into the skin every 6 (six) months.    [provider]  omeprazole (PRILOSEC) 20 MG capsule TAKE 1 CAPSULE DAILY 09/09/22   Malachy Mood, MD  ondansetron (ZOFRAN) 8 MG tablet Take 1 tablet (8 mg total) by mouth 2 (two) times daily as needed (Nausea or vomiting). Patient taking differently: Take 8 mg by mouth 2 (two) times daily as needed for vomiting or nausea. 01/05/22   Malachy Mood, MD  oxyCODONE (OXY IR/ROXICODONE) 5 MG immediate release tablet  Take 0.5-1 tablets (2.5-5 mg total) by mouth every 12 (twelve) hours as needed for severe pain. 12/31/22   Malachy Mood, MD  Pancrelipase, Lip-Prot-Amyl, [facility]) [phone] units CPEP Take 2 capsules (74,000 Units total) by mouth with breakfast, with lunch, and with evening meal. Take 2 capsules with each meal and 1 with snack 10/26/22   Mansouraty, Netty Starring., MD  polyethylene glycol powder (MIRALAX) 17 GM/SCOOP powder Take 17 g by mouth in the morning and at bedtime.    [provider]  prochlorperazine (COMPAZINE) 10 MG tablet Take 1 tablet (10 mg total) by mouth every 6 (six) hours as needed (Nausea or vomiting). Patient taking differently: Take 10 mg by mouth every 6 (six) hours as needed for vomiting or nausea. 01/05/22   Malachy Mood, MD  rosuvastatin (CRESTOR) 10 MG tablet TAKE 1 TABLET DAILY 06/17/22   Tolia, Sunit, DO  tiZANidine (ZANAFLEX) 2 MG tablet Take 2 mg by mouth once. 10/26/22   [provider]  triamcinolone cream (KENALOG) 0.1 % Apply 1 application topically 2 (two) times daily. Patient taking differently: Apply  1 application  topically 2 (two) times daily as needed (itching). 01/16/21   Duanne Limerick, MD  zoledronic acid (RECLAST) 5 MG/100ML SOLN injection Inject 5 mg into the vein See admin instructions. Once a year    [provider]    Physical Exam: BP 136/83   Pulse (!) 55   Temp 98.1 F (36.7 C)   Resp (!) 24   SpO2 100%   General:  Alert, oriented, calm, in no acute distress  Eyes: EOMI, clear conjuctivae, white sclerea Neck: supple, no masses, trachea mildline  Cardiovascular: RRR, no murmurs or rubs, no peripheral edema  Respiratory: clear to auscultation bilaterally, no wheezes, no crackles  Abdomen: soft, nontender, nondistended, normal bowel tones heard  Skin: dry, no rashes  Musculoskeletal: no joint effusions, right hip tender, right lower extremity foreshortened and externally rotated Psychiatric: appropriate affect, normal  speech  Neurologic: extraocular muscles intact, clear speech, moving all extremities with intact sensorium          Labs on Admission:  Basic Metabolic Panel: Recent Labs  Lab 02/03/23 1519  NA 137  K 3.8  CL 104  CO2 24  GLUCOSE 114*  BUN 17  CREATININE 1.11  CALCIUM 8.9   Liver Function Tests: No results for input(s): "AST", "ALT", "ALKPHOS", "BILITOT", "PROT", "ALBUMIN" in the last 168 hours. No results for input(s): "LIPASE", "AMYLASE" in the last 168 hours. No results for input(s): "AMMONIA" in the last 168 hours. CBC: Recent Labs  Lab 02/03/23 1519  WBC 5.8  NEUTROABS 4.0  HGB 12.5*  HCT 39.8  MCV 94.3  PLT 180   Cardiac Enzymes: No results for input(s): "CKTOTAL", "CKMB", "CKMBINDEX", "TROPONINI" in the last 168 hours.  BNP (last 3 results) No results for input(s): "BNP" in the last 8760 hours.  ProBNP (last 3 results) No results for input(s): "PROBNP" in the last 8760 hours.  CBG: No results for input(s): "GLUCAP" in the last 168 hours.  Radiological Exams on Admission: DG Chest 1 View  Result Date: 02/03/2023 CLINICAL DATA:  Fracture right hip EXAM: CHEST  1 VIEW COMPARISON:  01/18/2023 FINDINGS: Transverse diameter of heart is increased. Thoracic aorta is tortuous and ectatic. Lung fields are clear of any infiltrates or pulmonary edema. There is no pleural effusion or pneumothorax. There is vertebroplasty in multiple thoracic and upper lumbar vertebrae. IMPRESSION: There are no focal infiltrates or signs of pulmonary edema. Electronically Signed   By: Ernie Avena M.D.   On: 02/03/2023 16:12   DG Hip Unilat With Pelvis 2-3 Views Right  Result Date: 02/03/2023 CLINICAL DATA:  Trauma, fall EXAM: DG HIP (WITH OR WITHOUT PELVIS) 2-3V RIGHT COMPARISON:  None Available. FINDINGS: Acute fracture is seen in the neck of right femur. There is overriding of fracture fragments. There is no dislocation. IMPRESSION: Acute displaced fracture is seen in the neck  of proximal right femur. Electronically Signed   By: Ernie Avena M.D.   On: 02/03/2023 16:11    Assessment/Plan This is an unfortunate 82 year old gentleman with a history of prostate cancer, pancreatic cancer, atrial fibrillation on Eliquis who had a accidental fall today with displaced right femoral neck fracture.  Displaced fracture of right femoral neck (HCC) -Inpatient admission -Pain and nausea control -Anticipate formal orthopedic surgery evaluation  Leukocytosis-likely reactive, no signs or symptoms of infection   Parkinson's disease-continue home Sinemet    Pancreatic cancer (HCC) on surveillance after completing chemo 05/2022, plan for repeat CT scan with Dr. Mosetta Putt in July 2024.  Prostate cancer - on Eligard q6 months    A-fib (HCC)-currently in normal sinus rhythm -Telemetry -Holding Eliquis  DVT prophylaxis: Lovenox prophylactic dose    Code Status: Full Code  Consults called: EDP discussed with Orthopedic surgery Dr. Aundria Rud  Admission status: Inpatient   Time spent: 56 minutes  Leavy Heatherly Sharlette Dense MD [facility] Pager [phone]  If 7PM-7AM, please contact night-coverage [email or web address] Password [facility]  02/03/2023, 4:43 PM

## 2023-02-03 NOTE — ED Notes (Signed)
ED TO INPATIENT HANDOFF REPORT  ED Nurse Name and Phone #: Beatris Ship RN [phone]  S Name/Age/Gender [name] [name] 82 y.o. male Room/Bed: 015C/015C  Code Status   Code Status: Full Code  Home/SNF/Other Home Patient oriented to: self, place, time, and situation Is this baseline? Yes   Triage Complete: Triage complete  Chief Complaint Femur fracture, right (HCC) [S72.91XA]  Triage Note Patient slipped in kitchen and fell, sustaining injury to R hip/leg. Shortening and rotation noted to same. PMS intact. Did not hit head, no LOC. +blood thinner.    Allergies Allergies  Allergen Reactions   Influenza Vaccines Anaphylaxis    Flu shot: 1974 anaphylaxis. 2nd time swollen arm   Influenza Virus Vaccine Other (See Comments)    Level of Care/Admitting Diagnosis ED Disposition     ED Disposition  Admit   Condition  --   Comment  Hospital Area: [facility] [[id number]]  Level of Care: Telemetry Medical [104]  May admit patient to [facility] or [facility] if equivalent level of care is available:: No  Covid Evaluation: Asymptomatic - no recent exposure (last 10 days) testing not required  Diagnosis: Femur fracture, right [facility]) [[id number]]  Admitting Physician: Maryln Gottron [[id number]]  Attending Physician: Olexa.Dam, MIR Jaxson.Roy [[id number]]  Certification:: I certify this patient will need inpatient services for at least 2 midnights  Estimated Length of Stay: 3          B Medical/Surgery History 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)    Prostate cancer metastatic to bone (HCC)    REM sleep behavior disorder    Sleep apnea    BiPap   Past Surgical History:  Procedure Laterality Date   APPENDECTOMY  1963   BIOPSY  12/25/2021    Procedure: BIOPSY;  Surgeon: Lemar Lofty., MD;  Location: [facility] ENDOSCOPY;  Service: Gastroenterology;;   COLONOSCOPY  2010, 2015   ESOPHAGOGASTRODUODENOSCOPY N/A 04/16/2022   Procedure: ESOPHAGOGASTRODUODENOSCOPY (EGD);  Surgeon: Lemar Lofty., MD;  Location: [facility] ENDOSCOPY;  Service: Gastroenterology;  Laterality: N/A;   ESOPHAGOGASTRODUODENOSCOPY (EGD) WITH PROPOFOL N/A 12/25/2021   Procedure: ESOPHAGOGASTRODUODENOSCOPY (EGD) WITH PROPOFOL;  Surgeon: Meridee Score Netty Starring., MD;  Location: [facility] ENDOSCOPY;  Service: Gastroenterology;  Laterality: N/A;   EUS N/A 12/25/2021   Procedure: UPPER ENDOSCOPIC ULTRASOUND (EUS) RADIAL;  Surgeon: Lemar Lofty., MD;  Location: [facility] ENDOSCOPY;  Service: Gastroenterology;  Laterality: N/A;   EUS N/A 04/16/2022   Procedure: UPPER ENDOSCOPIC ULTRASOUND (EUS) RADIAL;  Surgeon: Lemar Lofty., MD;  Location: [facility] ENDOSCOPY;  Service: Gastroenterology;  Laterality: N/A;   FIDUCIAL MARKER PLACEMENT N/A 04/16/2022   Procedure: FIDUCIAL MARKER PLACEMENT;  Surgeon: Lemar Lofty., MD;  Location: [facility] ENDOSCOPY;  Service: Gastroenterology;  Laterality: N/A;   FINE NEEDLE ASPIRATION  12/25/2021   Procedure: FINE NEEDLE ASPIRATION (FNA) LINEAR;  Surgeon: Lemar Lofty., MD;  Location: [facility] ENDOSCOPY;  Service: Gastroenterology;;   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   KYPHOPLASTY N/A 08/15/2020   Procedure: L2 compression fracture;  Surgeon: Kennedy Bucker, MD;  Location: [facility] ORS;  Service: Orthopedics;  Laterality: N/A;   KYPHOPLASTY N/A 08/29/2020   Procedure: T8 KYPHOPLASTY;  Surgeon: Kennedy Bucker, MD;  Location: [facility] ORS;  Service: Orthopedics;  Laterality: N/A;   KYPHOPLASTY N/A 11/12/2020   Procedure: L1 KYPHOPLASTY;  Surgeon: Kennedy Bucker, MD;  Location: [facility] ORS;  Service: Orthopedics;  Laterality: N/A;   PORTACATH PLACEMENT N/A 02/05/2022   Procedure: INSERTION PORT-A-CATH;  Surgeon: Fritzi Mandes, MD;  Location: [facility] ORS;  Service:  General;  Laterality: N/A;     A IV Location/Drains/Wounds Patient Lines/Drains/Airways Status     Active Line/Drains/Airways     Name Placement date Placement time Site Days   Implanted Port 02/05/22 Right Chest 02/05/22  0749  Chest  363   Peripheral IV 02/03/23 20 G Posterior;Right Forearm 02/03/23  1349  Forearm  less than 1            Intake/Output Last 24 hours No intake or output data in the 24 hours ending 02/03/23 1653  Labs/Imaging Results for orders placed or performed during the hospital encounter of 02/03/23 (from the past 48 hour(s))  Type and screen [facility]     Status: None   Collection Time: 02/03/23  3:10 PM  Result Value Ref Range   ABO/RH(D) O POS    Antibody Screen NEG    Sample Expiration      02/06/2023,2359 Performed at [facility], [address], [place], [place] [address]   Basic metabolic panel     Status: Abnormal   Collection Time: 02/03/23  3:19 PM  Result Value Ref Range   Sodium 137 135 - 145 mmol/L   Potassium 3.8 3.5 - 5.1 mmol/L   Chloride 104 98 - 111 mmol/L   CO2 24 22 - 32 mmol/L   Glucose, Bld 114 (H) 70 - 99 mg/dL    Comment: Glucose reference range applies only to samples taken after fasting for at least 8 hours.   BUN 17 8 - 23 mg/dL   Creatinine, Ser 6.04 0.61 - 1.24 mg/dL   Calcium 8.9 8.9 - 54.0 mg/dL   GFR, Estimated >98 >11 mL/min    Comment: (NOTE) Calculated using the CKD-EPI Creatinine Equation (2021)    Anion gap 9 5 - 15    Comment: Performed at [facility], [address] [place], [place] [address]  CBC with Differential     Status: Abnormal   Collection Time: 02/03/23  3:19 PM  Result Value Ref Range   WBC 5.8 4.0 - 10.5 K/uL   RBC 4.22 4.22 - 5.81 MIL/uL   Hemoglobin 12.5 (L) 13.0 - 17.0 g/dL   HCT 29.5 62.1 - 30.8 %   MCV 94.3 80.0 - 100.0 fL   MCH 29.6 26.0 - 34.0 pg   MCHC 31.4 30.0 - 36.0 g/dL   RDW 65.7 84.6 - 96.2 %   Platelets 180 150 - 400 K/uL   nRBC 0.0  0.0 - 0.2 %   Neutrophils Relative % 68 %   Neutro Abs 4.0 1.7 - 7.7 K/uL   Lymphocytes Relative 14 %   Lymphs Abs 0.8 0.7 - 4.0 K/uL   Monocytes Relative 13 %   Monocytes Absolute 0.8 0.1 - 1.0 K/uL   Eosinophils Relative 3 %   Eosinophils Absolute 0.2 0.0 - 0.5 K/uL   Basophils Relative 1 %   Basophils Absolute 0.0 0.0 - 0.1 K/uL   Immature Granulocytes 1 %   Abs Immature Granulocytes 0.03 0.00 - 0.07 K/uL    Comment: Performed at [facility], [address] [place], [place] [address]  Protime-INR     Status: None  Collection Time: 02/03/23  3:19 PM  Result Value Ref Range   Prothrombin Time 15.0 11.4 - 15.2 seconds   INR 1.2 0.8 - 1.2    Comment: (NOTE) INR goal varies based on device and disease states. Performed at [facility], [address] [place], [place] [address]    DG Chest 1 View  Result Date: 02/03/2023 CLINICAL DATA:  Fracture right hip EXAM: CHEST  1 VIEW COMPARISON:  01/18/2023 FINDINGS: Transverse diameter of heart is increased. Thoracic aorta is tortuous and ectatic. Lung fields are clear of any infiltrates or pulmonary edema. There is no pleural effusion or pneumothorax. There is vertebroplasty in multiple thoracic and upper lumbar vertebrae. IMPRESSION: There are no focal infiltrates or signs of pulmonary edema. Electronically Signed   By: Ernie Avena M.D.   On: 02/03/2023 16:12   DG Hip Unilat With Pelvis 2-3 Views Right  Result Date: 02/03/2023 CLINICAL DATA:  Trauma, fall EXAM: DG HIP (WITH OR WITHOUT PELVIS) 2-3V RIGHT COMPARISON:  None Available. FINDINGS: Acute fracture is seen in the neck of right femur. There is overriding of fracture fragments. There is no dislocation. IMPRESSION: Acute displaced fracture is seen in the neck of proximal right femur. Electronically Signed   By: Ernie Avena M.D.   On: 02/03/2023 16:11    Pending Labs Unresulted Labs (From admission, onward)     Start     Ordered   02/04/23 0500  Basic  metabolic panel  Tomorrow morning,   R        02/03/23 1643   02/04/23 0500  CBC  Tomorrow morning,   R        02/03/23 1643            Vitals/Pain Today's Vitals   02/03/23 1400 02/03/23 1415 02/03/23 1500 02/03/23 1535  BP: 116/86 126/69 136/83   Pulse: 61 (!) 57 (!) 55   Resp:  18 (!) 24   Temp:      SpO2: 100% 100% 100%   PainSc:    10-Worst pain ever    Isolation Precautions No active isolations  Medications Medications  HYDROmorphone (DILAUDID) injection 0.5 mg (0.5 mg Intravenous Given 02/03/23 1502)  oxyCODONE (Oxy IR/ROXICODONE) immediate release tablet 2.5-5 mg (has no administration in time range)  diltiazem (CARDIZEM SR) 12 hr capsule 60 mg (has no administration in time range)  rosuvastatin (CRESTOR) tablet 10 mg (has no administration in time range)  pantoprazole (PROTONIX) EC tablet 40 mg (has no administration in time range)  Pancrelipase (Lip-Prot-Amyl) 37000-97300 units CPEP 74,000 Units (has no administration in time range)  cyanocobalamin (VITAMIN B12) tablet 1,000 mcg (has no administration in time range)  clonazePAM (KLONOPIN) tablet 0.5 mg (has no administration in time range)  Glycerin-Hypromellose-PEG 400 0.2-0.2-1 % SOLN 1 drop (has no administration in time range)  enoxaparin (LOVENOX) injection 40 mg (has no administration in time range)  acetaminophen (TYLENOL) tablet 650 mg (has no administration in time range)    Or  acetaminophen (TYLENOL) suppository 650 mg (has no administration in time range)  traZODone (DESYREL) tablet 25 mg (has no administration in time range)  docusate sodium (COLACE) capsule 100 mg (has no administration in time range)  polyethylene glycol (MIRALAX / GLYCOLAX) packet 17 g (has no administration in time range)  ondansetron (ZOFRAN) tablet 4 mg (has no administration in time range)    Or  ondansetron (ZOFRAN) injection 4 mg (has no administration in time range)  albuterol (PROVENTIL) (2.5 MG/3ML) 0.083% nebulizer  solution 2.5 mg (has no administration in time range)  metoprolol tartrate (LOPRESSOR) injection 5 mg (has no administration in time range)    Mobility non-ambulatory since arrival d/t injury.      R Recommendations: See Admitting Provider Note  Report given to: [phone]

## 2023-02-04 ENCOUNTER — Other Ambulatory Visit: Payer: Self-pay | Admitting: Orthopedic Surgery

## 2023-02-04 DIAGNOSIS — G20A1 Parkinson's disease without dyskinesia, without mention of fluctuations: Secondary | ICD-10-CM

## 2023-02-04 DIAGNOSIS — I480 Paroxysmal atrial fibrillation: Secondary | ICD-10-CM | POA: Diagnosis not present

## 2023-02-04 DIAGNOSIS — S72001A Fracture of unspecified part of neck of right femur, initial encounter for closed fracture: Secondary | ICD-10-CM | POA: Diagnosis not present

## 2023-02-04 DIAGNOSIS — C259 Malignant neoplasm of pancreas, unspecified: Secondary | ICD-10-CM

## 2023-02-04 LAB — BASIC METABOLIC PANEL
Anion gap: 9 (ref 5–15)
BUN: 16 mg/dL (ref 8–23)
CO2: 24 mmol/L (ref 22–32)
Calcium: 9.1 mg/dL (ref 8.9–10.3)
Chloride: 102 mmol/L (ref 98–111)
Creatinine, Ser: 1.11 mg/dL (ref 0.61–1.24)
GFR, Estimated: >60 mL/min (ref >60)
Glucose, Bld: 140 mg/dL — ABNORMAL HIGH (ref 70–99)
Potassium: 4 mmol/L (ref 3.5–5.1)
Sodium: 135 mmol/L (ref 135–145)

## 2023-02-04 LAB — CBC
HCT: 37.2 % — ABNORMAL LOW (ref 39.0–52.0)
Hemoglobin: 12 g/dL — ABNORMAL LOW (ref 13.0–17.0)
MCH: 29.5 pg (ref 26.0–34.0)
MCHC: 32.3 g/dL (ref 30.0–36.0)
MCV: 91.4 fL (ref 80.0–100.0)
Platelets: 187 10*3/uL (ref 150–400)
RBC: 4.07 MIL/uL — ABNORMAL LOW (ref 4.22–5.81)
RDW: 13.7 % (ref 11.5–15.5)
WBC: 7.7 10*3/uL (ref 4.0–10.5)
nRBC: 0 % (ref 0.0–0.2)

## 2023-02-04 LAB — ABO/RH: ABO/RH(D): O POS

## 2023-02-04 LAB — MRSA NEXT GEN BY PCR, NASAL: MRSA by PCR Next Gen: NOT DETECTED

## 2023-02-04 MED ORDER — HYDROMORPHONE HCL 1 MG/ML IJ SOLN
0.5000 mg | INTRAMUSCULAR | Status: DC | PRN
  Administered 2023-02-05: 0.5 mg via INTRAVENOUS
  Filled 2023-02-04: qty 1

## 2023-02-04 MED ORDER — OXYCODONE HCL 5 MG PO TABS
2.5000 mg | ORAL_TABLET | Freq: Four times a day (QID) | ORAL | Status: DC | PRN
  Administered 2023-02-04 – 2023-02-08 (×7): 5 mg via ORAL
  Filled 2023-02-04 (×7): qty 1

## 2023-02-04 NOTE — Progress Notes (Signed)
PROGRESS NOTE    [name] [name]  ZOX:[id number] DOB: 10/04/1940 DOA: 02/03/2023 PCP: Lorenda Ishihara, MD   Brief Narrative:  The patient is an 82 year old elderly Caucasian male with a past medical history significant for but limited to prostate cancer, history of pancreatic cancer, paroxysmal atrial fibrillation on anticoagulation with Eliquis, Parkinson disease, sleep apnea as well as other comorbidities including BPH who presented to the hospital with a traumatic right hip fracture.  Patient was seen in the usual state of health yesterday morning while he was carrying a tray when he suddenly slipped on his wooden floor with his socks and fell on his right hip.  He denies any loss of consciousness and did not hit his head.  Given his fall and right hip pain EMS was called and he was brought to the emergency department and x-ray was done which confirmed a displaced closed right femoral neck fracture.  Orthopedic surgery was consulted and hospitalist admit this patient and surgical.  Being planned but since patient has been on anticoagulation will need to have it washed out.  Timing of surgery has been deferred to orthopedic surgery and formal evaluation is still pending at this time.  Assessment and Plan:  Displaced Fracture of Right Femoral Neck (HCC) -Inpatient admission to Medical Telemetry -Patient had a mechanical fall and slipped on his wooden floor with his socks while he was carrying a tray -DG Hip done and showed "Acute displaced fracture is seen in the neck of proximal right femur." -Pain control with Acetaminophen 650 mg po/RC q6hprn Mild Pain or Fever >/= 101, Oxycodone IR 2.5-5 mg po q6hprn Severe Pain, and IV Hydromorphone 0.5 mg q4hprn Severe Pain -Bowel Regimen with po Docusate 100 mg po BID and Miralax 17 grams po Dailyprn -VTE prophylaxis with enoxaparin 40 mg subcu every 24 for now -Orthopedic surgery consulted and formal evaluation is pending but patient will need a  right hip arthroplasty and due to him being on Eliquis and OR availability, timing deferred to the orthopedic surgeon    Hx of Pancreatic Cancer (HCC)  -On surveillance after completing chemo 05/2022 -Plan for repeat CT scan with Dr. Mosetta Putt in July 2024. -C/w Lipase/Protease/Amylase Capsules 72,000 Units po TID   Prostate Cancer  -On Eligard q6 months   Paroxysmal A-fib (HCC) -Currently in normal sinus rhythm -Continue Telemetry Monitoring -Continue to Hold Apixaban at this time in anticipation for Surgery -C/w Diltiazem 60 mg po BID -Continue with metoprolol tartrate 5 mg IV every 6 as needed for high blood pressure  Parkinson's Disease  -C/w Carbidopa-Levodopa 50-200 mg 1 tab po qHS as well as Carbidopa-Levodopa 25-100 mg 3 Tab BID at 0700 and Carbidopa-Levodopa 25-100 mg 2 Tab BID at 1900 -Continue with clonazepam 0.5 mg p.o. nightly  GERD/GI Prophylaxis -C/w Pantoprazole 40 mg po Daily  HLD -C/w Rosuvastatin 10 mg po Daily   DVT prophylaxis: enoxaparin (LOVENOX) injection 40 mg Start: 02/03/23 1645 SCDs Start: 02/03/23 1643    Code Status: Full Code Family Communication: No family currently at bedside  Disposition Plan:  Level of care: Telemetry Medical Status is: Inpatient Remains inpatient appropriate because: Needs further clinical improvement and clearance by orthopedic surgery as well as evaluation by PT and OT for safe discharge disposition   Consultants:  Orthopedic Surgery  Procedures:  As delineated as above  Antimicrobials:  Anti-infectives (From admission, onward)    None       Subjective: Seen and then at bedside and he states that he was in  some pain and complaining of it being a 4 out of 10 in severity and states whenever he tries to move it is a 9 or 10 out of severity.  Any chest pain or shortness of breath.  States that he fell with his socks.  Denies any loss of consciousness.  No other concerns or complaints at this time and surgical evaluation  is still pending.  Objective: Vitals:   02/03/23 1830 02/03/23 1902 02/04/23 0433 02/04/23 0817  BP: (!) 153/86 (!) 153/85 109/63 110/68  Pulse: (!) 59 66 62 (!) 56  Resp: 20 17 16 17   Temp: 97.6 F (36.4 C) 97.6 F (36.4 C) 98.2 F (36.8 C) 98.4 F (36.9 C)  TempSrc:  Oral Oral Oral  SpO2: 94% 93% 97% 97%    Intake/Output Summary (Last 24 hours) at 02/04/2023 0831 Last data filed at 02/04/2023 0554 Gross per 24 hour  Intake 240 ml  Output 150 ml  Net 90 ml   There were no vitals filed for this visit.  Examination: Physical Exam:  Constitutional: Thin elderly Caucasian male in no acute distress Respiratory: Diminished to auscultation bilaterally with some coarse breath sounds, no wheezing, rales, rhonchi or crackles. Normal respiratory effort and patient is not tachypenic. No accessory muscle use.  Unlabored breathing Cardiovascular: RRR, no murmurs / rubs / gallops. S1 and S2 auscultated. No extremity edema. Abdomen: Soft, non-tender, non-distended.  Bowel sounds positive.  GU: Deferred. Musculoskeletal: Right leg is shorter and externally rotated compared to the left Skin: No rashes, lesions, ulcers on limited skin evaluation. No induration; Warm and dry.  Neurologic: CN 2-12 grossly intact with no focal deficits.  Romberg sign and cerebellar reflexes not assessed.  Psychiatric: Normal judgment and insight. Alert and oriented x 3. Normal mood and appropriate affect.   Data Reviewed: I have personally reviewed following labs and imaging studies  CBC: Recent Labs  Lab 02/03/23 1519 02/04/23 0135  WBC 5.8 7.7  NEUTROABS 4.0  --   HGB 12.5* 12.0*  HCT 39.8 37.2*  MCV 94.3 91.4  PLT 180 187   Basic Metabolic Panel: Recent Labs  Lab 02/03/23 1519 02/04/23 0135  NA 137 135  K 3.8 4.0  CL 104 102  CO2 24 24  GLUCOSE 114* 140*  BUN 17 16  CREATININE 1.11 1.11  CALCIUM 8.9 9.1   GFR: Estimated Creatinine Clearance: 45.4 mL/min (by C-G formula based on SCr of  1.11 mg/dL). Liver Function Tests: No results for input(s): "AST", "ALT", "ALKPHOS", "BILITOT", "PROT", "ALBUMIN" in the last 168 hours. No results for input(s): "LIPASE", "AMYLASE" in the last 168 hours. No results for input(s): "AMMONIA" in the last 168 hours. Coagulation Profile: Recent Labs  Lab 02/03/23 1519  INR 1.2   Cardiac Enzymes: No results for input(s): "CKTOTAL", "CKMB", "CKMBINDEX", "TROPONINI" in the last 168 hours. BNP (last 3 results) No results for input(s): "PROBNP" in the last 8760 hours. HbA1C: No results for input(s): "HGBA1C" in the last 72 hours. CBG: No results for input(s): "GLUCAP" in the last 168 hours. Lipid Profile: No results for input(s): "CHOL", "HDL", "LDLCALC", "TRIG", "CHOLHDL", "LDLDIRECT" in the last 72 hours. Thyroid Function Tests: No results for input(s): "TSH", "T4TOTAL", "FREET4", "T3FREE", "THYROIDAB" in the last 72 hours. Anemia Panel: No results for input(s): "VITAMINB12", "FOLATE", "FERRITIN", "TIBC", "IRON", "RETICCTPCT" in the last 72 hours. Sepsis Labs: No results for input(s): "PROCALCITON", "LATICACIDVEN" in the last 168 hours.  Recent Results (from the past 240 hour(s))  MRSA Next Gen by PCR,  Nasal     Status: None   Collection Time: 02/04/23  5:57 AM   Specimen: Nasal Mucosa; Nasal Swab  Result Value Ref Range Status   MRSA by PCR Next Gen NOT DETECTED NOT DETECTED Final    Comment: (NOTE) The GeneXpert MRSA Assay (FDA approved for NASAL specimens only), is one component of a comprehensive MRSA colonization surveillance program. It is not intended to diagnose MRSA infection nor to guide or monitor treatment for MRSA infections. Test performance is not FDA approved in patients less than 48 years old. Performed at [facility], [address], [place], [place] [address]     Radiology Studies: DG Chest 1 View  Result Date: 02/03/2023 CLINICAL DATA:  Fracture right hip EXAM: CHEST  1 VIEW COMPARISON:  01/18/2023  FINDINGS: Transverse diameter of heart is increased. Thoracic aorta is tortuous and ectatic. Lung fields are clear of any infiltrates or pulmonary edema. There is no pleural effusion or pneumothorax. There is vertebroplasty in multiple thoracic and upper lumbar vertebrae. IMPRESSION: There are no focal infiltrates or signs of pulmonary edema. Electronically Signed   By: Ernie Avena M.D.   On: 02/03/2023 16:12   DG Hip Unilat With Pelvis 2-3 Views Right  Result Date: 02/03/2023 CLINICAL DATA:  Trauma, fall EXAM: DG HIP (WITH OR WITHOUT PELVIS) 2-3V RIGHT COMPARISON:  None Available. FINDINGS: Acute fracture is seen in the neck of right femur. There is overriding of fracture fragments. There is no dislocation. IMPRESSION: Acute displaced fracture is seen in the neck of proximal right femur. Electronically Signed   By: Ernie Avena M.D.   On: 02/03/2023 16:11    Scheduled Meds:  carbidopa-levodopa  1 tablet Oral QHS   carbidopa-levodopa  3 tablet Oral BID   And   carbidopa-levodopa  2 tablet Oral BID   clonazePAM  0.5 mg Oral QHS   cyanocobalamin  1,000 mcg Oral Daily   diltiazem  60 mg Oral BID   docusate sodium  100 mg Oral BID   enoxaparin (LOVENOX) injection  40 mg Subcutaneous Q24H   lipase/protease/amylase  72,000 Units Oral TID with meals   pantoprazole  40 mg Oral Daily   rosuvastatin  10 mg Oral Daily   Continuous Infusions:   LOS: 1 day   Marguerita Merles, DO [facility] Available via Epic secure chat 7am-7pm After these hours, please refer to coverage provider listed on [email or web address] 02/04/2023, 8:31 AM

## 2023-02-04 NOTE — Consult Note (Addendum)
Reason for Consult:Right hip fx Referring Physician: Marguerita Merles Time called: 0730 Time at bedside: 0946   [name] [name] is an 82 y.o. male.  HPI: [name] [name] was walking in his apartment yesterday. He thinks he may have had a spasm in his leg that caused him to lose his balance and fall. He had immediate right hip pan and could not get up. He crawled to a phone and was brought to the ED by EMS. Workup showed a right hip fx and orthopedic surgery was consulted.  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 [facility])    Prostate cancer metastatic to bone (HCC)    REM sleep behavior disorder    Sleep apnea    BiPap    Past Surgical History:  Procedure Laterality Date   APPENDECTOMY  1963   BIOPSY  12/25/2021   Procedure: BIOPSY;  Surgeon: Lemar Lofty., MD;  Location: [facility] ENDOSCOPY;  Service: Gastroenterology;;   COLONOSCOPY  2010, 2015   ESOPHAGOGASTRODUODENOSCOPY N/A 04/16/2022   Procedure: ESOPHAGOGASTRODUODENOSCOPY (EGD);  Surgeon: Lemar Lofty., MD;  Location: [facility] ENDOSCOPY;  Service: Gastroenterology;  Laterality: N/A;   ESOPHAGOGASTRODUODENOSCOPY (EGD) WITH PROPOFOL N/A 12/25/2021   Procedure: ESOPHAGOGASTRODUODENOSCOPY (EGD) WITH PROPOFOL;  Surgeon: Meridee Score Netty Starring., MD;  Location: [facility] ENDOSCOPY;  Service: Gastroenterology;  Laterality: N/A;   EUS N/A 12/25/2021   Procedure: UPPER ENDOSCOPIC ULTRASOUND (EUS) RADIAL;  Surgeon: Lemar Lofty., MD;  Location: [facility] ENDOSCOPY;  Service: Gastroenterology;  Laterality: N/A;   EUS N/A 04/16/2022   Procedure: UPPER ENDOSCOPIC ULTRASOUND (EUS) RADIAL;  Surgeon: Lemar Lofty., MD;  Location: [facility] ENDOSCOPY;  Service: Gastroenterology;  Laterality: N/A;   FIDUCIAL MARKER PLACEMENT N/A 04/16/2022    Procedure: FIDUCIAL MARKER PLACEMENT;  Surgeon: Lemar Lofty., MD;  Location: [facility] ENDOSCOPY;  Service: Gastroenterology;  Laterality: N/A;   FINE NEEDLE ASPIRATION  12/25/2021   Procedure: FINE NEEDLE ASPIRATION (FNA) LINEAR;  Surgeon: Lemar Lofty., MD;  Location: [facility] ENDOSCOPY;  Service: Gastroenterology;;   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   KYPHOPLASTY N/A 08/15/2020   Procedure: L2 compression fracture;  Surgeon: Kennedy Bucker, MD;  Location: [facility] ORS;  Service: Orthopedics;  Laterality: N/A;   KYPHOPLASTY N/A 08/29/2020   Procedure: T8 KYPHOPLASTY;  Surgeon: Kennedy Bucker, MD;  Location: [facility] ORS;  Service: Orthopedics;  Laterality: N/A;   KYPHOPLASTY N/A 11/12/2020   Procedure: L1 KYPHOPLASTY;  Surgeon: Kennedy Bucker, MD;  Location: [facility] ORS;  Service: Orthopedics;  Laterality: N/A;   PORTACATH PLACEMENT N/A 02/05/2022   Procedure: INSERTION PORT-A-CATH;  Surgeon: Fritzi Mandes, MD;  Location: [facility] ORS;  Service: General;  Laterality: N/A;    Family History  Problem Relation Age of Onset   Heart disease Maternal Grandfather    Diabetes Paternal Grandfather    Breast cancer Daughter     Social History:  reports that he quit smoking about 12 years ago. His smoking use included cigars. He has never used smokeless tobacco. He reports current alcohol use. He reports that he does not use drugs.  Allergies:  Allergies  Allergen Reactions   Influenza Vaccines Anaphylaxis    Flu shot: 1974 anaphylaxis. 2nd time swollen arm   Influenza Virus Vaccine Other (See Comments)    Medications: I have reviewed  the patient's current medications.  Results for orders placed or performed during the hospital encounter of 02/03/23 (from the past 48 hour(s))  Type and screen [facility]     Status: None   Collection Time: 02/03/23  3:10 PM  Result Value Ref Range   ABO/RH(D) O POS    Antibody Screen NEG    Sample Expiration      02/06/2023,2359 Performed at [facility], [address] [place], [place] [address]   Basic metabolic panel     Status: Abnormal   Collection Time: 02/03/23  3:19 PM  Result Value Ref Range   Sodium 137 135 - 145 mmol/L   Potassium 3.8 3.5 - 5.1 mmol/L   Chloride 104 98 - 111 mmol/L   CO2 24 22 - 32 mmol/L   Glucose, Bld 114 (H) 70 - 99 mg/dL    Comment: Glucose reference range applies only to samples taken after fasting for at least 8 hours.   BUN 17 8 - 23 mg/dL   Creatinine, Ser 6.04 0.61 - 1.24 mg/dL   Calcium 8.9 8.9 - 54.0 mg/dL   GFR, Estimated >98 >11 mL/min    Comment: (NOTE) Calculated using the CKD-EPI Creatinine Equation (2021)    Anion gap 9 5 - 15    Comment: Performed at [facility], [address], [place], [place] [address]  CBC with Differential     Status: Abnormal   Collection Time: 02/03/23  3:19 PM  Result Value Ref Range   WBC 5.8 4.0 - 10.5 K/uL   RBC 4.22 4.22 - 5.81 MIL/uL   Hemoglobin 12.5 (L) 13.0 - 17.0 g/dL   HCT 29.5 62.1 - 30.8 %   MCV 94.3 80.0 - 100.0 fL   MCH 29.6 26.0 - 34.0 pg   MCHC 31.4 30.0 - 36.0 g/dL   RDW 65.7 84.6 - 96.2 %   Platelets 180 150 - 400 K/uL   nRBC 0.0 0.0 - 0.2 %   Neutrophils Relative % 68 %   Neutro Abs 4.0 1.7 - 7.7 K/uL   Lymphocytes Relative 14 %   Lymphs Abs 0.8 0.7 - 4.0 K/uL   Monocytes Relative 13 %   Monocytes Absolute 0.8 0.1 - 1.0 K/uL   Eosinophils Relative 3 %   Eosinophils Absolute 0.2 0.0 - 0.5 K/uL   Basophils Relative 1 %   Basophils Absolute 0.0 0.0 - 0.1 K/uL   Immature Granulocytes 1 %   Abs Immature Granulocytes 0.03 0.00 - 0.07 K/uL    Comment: Performed at [facility], [address], [place], [place] [address]  Protime-INR     Status: None   Collection Time: 02/03/23  3:19 PM  Result Value Ref Range   Prothrombin Time 15.0 11.4 - 15.2 seconds   INR 1.2 0.8 - 1.2    Comment: (NOTE) INR goal varies based on device and disease states. Performed at [facility], [address]  [place], [place] [address]   Basic metabolic panel     Status: Abnormal   Collection Time: 02/04/23  1:35 AM  Result Value Ref Range   Sodium 135 135 - 145 mmol/L   Potassium 4.0 3.5 - 5.1 mmol/L   Chloride 102 98 - 111 mmol/L   CO2 24 22 - 32 mmol/L   Glucose, Bld 140 (H) 70 - 99 mg/dL    Comment: Glucose reference range applies only to samples taken after fasting for at  least 8 hours.   BUN 16 8 - 23 mg/dL   Creatinine, Ser 1.61 0.61 - 1.24 mg/dL   Calcium 9.1 8.9 - 09.6 mg/dL   GFR, Estimated >04 >54 mL/min    Comment: (NOTE) Calculated using the CKD-EPI Creatinine Equation (2021)    Anion gap 9 5 - 15    Comment: Performed at [facility], [address], [place], [place] [address]  CBC     Status: Abnormal   Collection Time: 02/04/23  1:35 AM  Result Value Ref Range   WBC 7.7 4.0 - 10.5 K/uL   RBC 4.07 (L) 4.22 - 5.81 MIL/uL   Hemoglobin 12.0 (L) 13.0 - 17.0 g/dL   HCT 91.4 (L) 78.2 - 95.6 %   MCV 91.4 80.0 - 100.0 fL   MCH 29.5 26.0 - 34.0 pg   MCHC 32.3 30.0 - 36.0 g/dL   RDW 21.3 08.6 - 57.8 %   Platelets 187 150 - 400 K/uL   nRBC 0.0 0.0 - 0.2 %    Comment: Performed at [facility], [address] [place], [place] [address]  MRSA Next Gen by PCR, Nasal     Status: None   Collection Time: 02/04/23  5:57 AM   Specimen: Nasal Mucosa; Nasal Swab  Result Value Ref Range   MRSA by PCR Next Gen NOT DETECTED NOT DETECTED    Comment: (NOTE) The GeneXpert MRSA Assay (FDA approved for NASAL specimens only), is one component of a comprehensive MRSA colonization surveillance program. It is not intended to diagnose MRSA infection nor to guide or monitor treatment for MRSA infections. Test performance is not FDA approved in patients less than 74 years old. Performed at [facility], [address] [place], [place] [address]     DG Chest 1 View  Result Date: 02/03/2023 CLINICAL DATA:  Fracture right hip EXAM: CHEST  1 VIEW COMPARISON:  01/18/2023 FINDINGS:  Transverse diameter of heart is increased. Thoracic aorta is tortuous and ectatic. Lung fields are clear of any infiltrates or pulmonary edema. There is no pleural effusion or pneumothorax. There is vertebroplasty in multiple thoracic and upper lumbar vertebrae. IMPRESSION: There are no focal infiltrates or signs of pulmonary edema. Electronically Signed   By: Ernie Avena M.D.   On: 02/03/2023 16:12   DG Hip Unilat With Pelvis 2-3 Views Right  Result Date: 02/03/2023 CLINICAL DATA:  Trauma, fall EXAM: DG HIP (WITH OR WITHOUT PELVIS) 2-3V RIGHT COMPARISON:  None Available. FINDINGS: Acute fracture is seen in the neck of right femur. There is overriding of fracture fragments. There is no dislocation. IMPRESSION: Acute displaced fracture is seen in the neck of proximal right femur. Electronically Signed   By: Ernie Avena M.D.   On: 02/03/2023 16:11    Review of Systems  HENT:  Negative for ear discharge, ear pain, hearing loss and tinnitus.   Eyes:  Negative for photophobia and pain.  Respiratory:  Negative for cough and shortness of breath.   Cardiovascular:  Negative for chest pain.  Gastrointestinal:  Negative for abdominal pain, nausea and vomiting.  Genitourinary:  Negative for dysuria, flank pain, frequency and urgency.  Musculoskeletal:  Positive for arthralgias (Right hip). Negative for back pain, myalgias and neck pain.  Neurological:  Negative for dizziness and headaches.  Hematological:  Does not bruise/bleed easily.  Psychiatric/Behavioral:  The patient is not nervous/anxious.    Blood pressure 110/68, pulse (!) 56, temperature 98.4 F (36.9 C), temperature source  Oral, resp. rate 17, SpO2 97 %. Physical Exam Constitutional:      General: He is not in acute distress.    Appearance: He is well-developed. He is not diaphoretic.  HENT:     Head: Normocephalic and atraumatic.  Eyes:     General: No scleral icterus.       Right eye: No discharge.        Left eye: No  discharge.     Conjunctiva/sclera: Conjunctivae normal.  Cardiovascular:     Rate and Rhythm: Normal rate and regular rhythm.  Pulmonary:     Effort: Pulmonary effort is normal. No respiratory distress.  Musculoskeletal:     Cervical back: Normal range of motion.     Comments: LLE No traumatic wounds, ecchymosis, or rash  Mild TTP hip  No knee or ankle effusion  Knee stable to varus/ valgus and anterior/posterior stress  Sens DPN, SPN, TN intact  Motor EHL, ext, flex, evers 5/5  DP 2+, PT 2+, No significant edema  Skin:    General: Skin is warm and dry.  Neurological:     Mental Status: He is alert.  Psychiatric:        Mood and Affect: Mood normal.        Behavior: Behavior normal.    Assessment/Plan: Right hip fx -- Plan OR tomorrow with Dr. Luiz Blare. Please keep NPO after MN. Multiple medical problems including Parkinson's, prostate cancer, pancreatic cancer, and paroxysmal atrial fibrillation on Eliquis -- per primary service  I have reviewed the images on this patient and concur with the plan of hemiarthroplasty of the hip.  We will proceed with hemiarthroplasty once operative time is available.  I have discussed the patient's procedure with the son.I have had a prolonged discussion with the patient regarding the risk and benefits of the surgical procedure.  The patient understands the risks include but are not limited to bleeding, infection and failure of the surgery to cure the problem and need for further surgery.  The patient understands there is a slight risk of death at the time of surgery.  The patient understands these risks along with the potential benefits and wishes to proceed with surgical intervention.    The patient will be followed in the office in the postoperative period.  Freeman Caldron, PA-C Orthopedic Surgery [phone] 02/04/2023, 10:05 AM

## 2023-02-05 ENCOUNTER — Encounter (HOSPITAL_COMMUNITY)
Admission: EM | Disposition: A | Discharge status: Skilled Nursing Facility | Payer: Self-pay | Source: Home / Self Care | Attending: Internal Medicine

## 2023-02-05 ENCOUNTER — Inpatient Hospital Stay (HOSPITAL_COMMUNITY): Payer: Medicare Other

## 2023-02-05 ENCOUNTER — Encounter (HOSPITAL_COMMUNITY): Payer: Self-pay | Admitting: Internal Medicine

## 2023-02-05 ENCOUNTER — Other Ambulatory Visit: Payer: Self-pay

## 2023-02-05 DIAGNOSIS — G4733 Obstructive sleep apnea (adult) (pediatric): Secondary | ICD-10-CM

## 2023-02-05 DIAGNOSIS — Z87891 Personal history of nicotine dependence: Secondary | ICD-10-CM

## 2023-02-05 DIAGNOSIS — S72001A Fracture of unspecified part of neck of right femur, initial encounter for closed fracture: Secondary | ICD-10-CM | POA: Diagnosis not present

## 2023-02-05 DIAGNOSIS — I480 Paroxysmal atrial fibrillation: Secondary | ICD-10-CM | POA: Diagnosis not present

## 2023-02-05 DIAGNOSIS — I4891 Unspecified atrial fibrillation: Secondary | ICD-10-CM

## 2023-02-05 DIAGNOSIS — D649 Anemia, unspecified: Secondary | ICD-10-CM

## 2023-02-05 DIAGNOSIS — C259 Malignant neoplasm of pancreas, unspecified: Secondary | ICD-10-CM | POA: Diagnosis not present

## 2023-02-05 DIAGNOSIS — E8809 Other disorders of plasma-protein metabolism, not elsewhere classified: Secondary | ICD-10-CM

## 2023-02-05 DIAGNOSIS — G20A1 Parkinson's disease without dyskinesia, without mention of fluctuations: Secondary | ICD-10-CM | POA: Diagnosis not present

## 2023-02-05 DIAGNOSIS — Z9989 Dependence on other enabling machines and devices: Secondary | ICD-10-CM

## 2023-02-05 HISTORY — PX: ANTERIOR APPROACH HEMI HIP ARTHROPLASTY: SHX6690

## 2023-02-05 LAB — COMPREHENSIVE METABOLIC PANEL
ALT: 6 U/L (ref 0–44)
AST: 22 U/L (ref 15–41)
Albumin: 3.2 g/dL — ABNORMAL LOW (ref 3.5–5.0)
Alkaline Phosphatase: 47 U/L (ref 38–126)
Anion gap: 11 (ref 5–15)
BUN: 18 mg/dL (ref 8–23)
CO2: 23 mmol/L (ref 22–32)
Calcium: 8.8 mg/dL — ABNORMAL LOW (ref 8.9–10.3)
Chloride: 103 mmol/L (ref 98–111)
Creatinine, Ser: 1.12 mg/dL (ref 0.61–1.24)
GFR, Estimated: >60 mL/min (ref >60)
Glucose, Bld: 114 mg/dL — ABNORMAL HIGH (ref 70–99)
Potassium: 4.1 mmol/L (ref 3.5–5.1)
Sodium: 137 mmol/L (ref 135–145)
Total Bilirubin: 1.3 mg/dL — ABNORMAL HIGH (ref 0.3–1.2)
Total Protein: 6.1 g/dL — ABNORMAL LOW (ref 6.5–8.1)

## 2023-02-05 LAB — CBC WITH DIFFERENTIAL/PLATELET
Abs Immature Granulocytes: 0.02 10*3/uL (ref 0.00–0.07)
Basophils Absolute: 0 10*3/uL (ref 0.0–0.1)
Basophils Relative: 0 %
Eosinophils Absolute: 0.2 10*3/uL (ref 0.0–0.5)
Eosinophils Relative: 3 %
HCT: 38.8 % — ABNORMAL LOW (ref 39.0–52.0)
Hemoglobin: 12.5 g/dL — ABNORMAL LOW (ref 13.0–17.0)
Immature Granulocytes: 0 %
Lymphocytes Relative: 16 %
Lymphs Abs: 1.1 10*3/uL (ref 0.7–4.0)
MCH: 29.3 pg (ref 26.0–34.0)
MCHC: 32.2 g/dL (ref 30.0–36.0)
MCV: 91.1 fL (ref 80.0–100.0)
Monocytes Absolute: 0.9 10*3/uL (ref 0.1–1.0)
Monocytes Relative: 13 %
Neutro Abs: 4.6 10*3/uL (ref 1.7–7.7)
Neutrophils Relative %: 68 %
Platelets: 168 10*3/uL (ref 150–400)
RBC: 4.26 MIL/uL (ref 4.22–5.81)
RDW: 13.6 % (ref 11.5–15.5)
WBC: 6.8 10*3/uL (ref 4.0–10.5)
nRBC: 0 % (ref 0.0–0.2)

## 2023-02-05 LAB — PHOSPHORUS: Phosphorus: 2.6 mg/dL (ref 2.5–4.6)

## 2023-02-05 LAB — MAGNESIUM: Magnesium: 2.1 mg/dL (ref 1.7–2.4)

## 2023-02-05 SURGERY — HEMIARTHROPLASTY, HIP, DIRECT ANTERIOR APPROACH, FOR FRACTURE
Anesthesia: General | Laterality: Right

## 2023-02-05 MED ORDER — CARBIDOPA-LEVODOPA 25-100 MG PO TABS
2.0000 | ORAL_TABLET | Freq: Four times a day (QID) | ORAL | Status: DC
  Filled 2023-02-05 (×2): qty 2

## 2023-02-05 MED ORDER — FENTANYL CITRATE (PF) 100 MCG/2ML IJ SOLN: 25.0000 ug | INTRAMUSCULAR | Status: DC | PRN

## 2023-02-05 MED ORDER — TIZANIDINE HCL 2 MG PO TABS: 2.0000 mg | ORAL_TABLET | Freq: Three times a day (TID) | ORAL | Status: DC | PRN

## 2023-02-05 MED ORDER — ALBUMIN HUMAN 5 % IV SOLN: INTRAVENOUS | Status: DC | PRN

## 2023-02-05 MED ORDER — DOCUSATE SODIUM 100 MG PO CAPS: 100.0000 mg | ORAL_CAPSULE | Freq: Two times a day (BID) | ORAL | Status: DC

## 2023-02-05 MED ORDER — FENTANYL CITRATE (PF) 250 MCG/5ML IJ SOLN
INTRAMUSCULAR | Status: AC
  Filled 2023-02-05: qty 5

## 2023-02-05 MED ORDER — FENTANYL CITRATE (PF) 250 MCG/5ML IJ SOLN
INTRAMUSCULAR | Status: DC | PRN
  Administered 2023-02-05 (×3): 50 ug via INTRAVENOUS

## 2023-02-05 MED ORDER — BISACODYL 5 MG PO TBEC: 5.0000 mg | DELAYED_RELEASE_TABLET | Freq: Every day | ORAL | Status: DC | PRN

## 2023-02-05 MED ORDER — APIXABAN 5 MG PO TABS
5.0000 mg | ORAL_TABLET | Freq: Two times a day (BID) | ORAL | Status: DC
  Administered 2023-02-06 – 2023-02-09 (×7): 5 mg via ORAL
  Filled 2023-02-05 (×7): qty 1

## 2023-02-05 MED ORDER — BUPIVACAINE HCL (PF) 0.5 % IJ SOLN
INTRAMUSCULAR | Status: AC
  Filled 2023-02-05: qty 30

## 2023-02-05 MED ORDER — TRANEXAMIC ACID-NACL 1000-0.7 MG/100ML-% IV SOLN
1000.0000 mg | INTRAVENOUS | Status: AC
  Administered 2023-02-05: 1000 mg via INTRAVENOUS
  Filled 2023-02-05: qty 100

## 2023-02-05 MED ORDER — LACTATED RINGERS IV SOLN: INTRAVENOUS | Status: DC

## 2023-02-05 MED ORDER — POVIDONE-IODINE 10 % EX SWAB
2.0000 | Freq: Once | CUTANEOUS | Status: AC
  Administered 2023-02-05: 2 via TOPICAL

## 2023-02-05 MED ORDER — 0.9 % SODIUM CHLORIDE (POUR BTL) OPTIME
TOPICAL | Status: DC | PRN
  Administered 2023-02-05: 1000 mL

## 2023-02-05 MED ORDER — CEFAZOLIN SODIUM-DEXTROSE 2-4 GM/100ML-% IV SOLN
2.0000 g | INTRAVENOUS | Status: AC
  Administered 2023-02-05: 2 g via INTRAVENOUS
  Filled 2023-02-05: qty 100

## 2023-02-05 MED ORDER — PHENOL 1.4 % MT LIQD: 1.0000 | OROMUCOSAL | Status: DC | PRN

## 2023-02-05 MED ORDER — BUPIVACAINE LIPOSOME 1.3 % IJ SUSP
10.0000 mL | Freq: Once | INTRAMUSCULAR | Status: DC
  Filled 2023-02-05: qty 20

## 2023-02-05 MED ORDER — CHLORHEXIDINE GLUCONATE 0.12 % MT SOLN
OROMUCOSAL | Status: AC
  Administered 2023-02-05: 15 mL via OROMUCOSAL
  Filled 2023-02-05: qty 15

## 2023-02-05 MED ORDER — ALUM & MAG HYDROXIDE-SIMETH 200-200-20 MG/5ML PO SUSP: 30.0000 mL | ORAL | Status: DC | PRN

## 2023-02-05 MED ORDER — ESMOLOL HCL 100 MG/10ML IV SOLN
INTRAVENOUS | Status: DC | PRN
  Administered 2023-02-05: 40 mg via INTRAVENOUS

## 2023-02-05 MED ORDER — BUPIVACAINE LIPOSOME 1.3 % IJ SUSP
INTRAMUSCULAR | Status: DC | PRN
  Administered 2023-02-05: 20 mL

## 2023-02-05 MED ORDER — BUPIVACAINE LIPOSOME 1.3 % IJ SUSP
INTRAMUSCULAR | Status: AC
  Filled 2023-02-05: qty 20

## 2023-02-05 MED ORDER — DILTIAZEM HCL-DEXTROSE 125-5 MG/125ML-% IV SOLN (PREMIX)
5.0000 mg/h | INTRAVENOUS | Status: DC
  Administered 2023-02-05: 5 mg/h via INTRAVENOUS
  Filled 2023-02-05: qty 125

## 2023-02-05 MED ORDER — PHENYLEPHRINE HCL-NACL 20-0.9 MG/250ML-% IV SOLN
INTRAVENOUS | Status: DC | PRN
  Administered 2023-02-05: 50 ug/min via INTRAVENOUS

## 2023-02-05 MED ORDER — LIDOCAINE 2% (20 MG/ML) 5 ML SYRINGE
INTRAMUSCULAR | Status: DC | PRN
  Administered 2023-02-05: 40 mg via INTRAVENOUS

## 2023-02-05 MED ORDER — POLYETHYLENE GLYCOL 3350 17 G PO PACK
17.0000 g | PACK | Freq: Every day | ORAL | Status: DC
  Administered 2023-02-06 – 2023-02-08 (×3): 17 g via ORAL
  Filled 2023-02-05 (×3): qty 1

## 2023-02-05 MED ORDER — TERBINAFINE HCL 1 % EX CREA
1.0000 | TOPICAL_CREAM | Freq: Two times a day (BID) | CUTANEOUS | Status: DC
  Filled 2023-02-05: qty 12

## 2023-02-05 MED ORDER — METOPROLOL TARTRATE 5 MG/5ML IV SOLN
INTRAVENOUS | Status: DC | PRN
  Administered 2023-02-05: 2 mg via INTRAVENOUS

## 2023-02-05 MED ORDER — BUPIVACAINE HCL 0.5 % IJ SOLN
INTRAMUSCULAR | Status: DC | PRN
  Administered 2023-02-05: 30 mL

## 2023-02-05 MED ORDER — ACETAMINOPHEN 160 MG/5ML PO SOLN: 1000.0000 mg | Freq: Once | ORAL | Status: DC | PRN

## 2023-02-05 MED ORDER — ONDANSETRON HCL 4 MG PO TABS: 4.0000 mg | ORAL_TABLET | Freq: Four times a day (QID) | ORAL | Status: DC | PRN

## 2023-02-05 MED ORDER — ROCURONIUM BROMIDE 10 MG/ML (PF) SYRINGE
PREFILLED_SYRINGE | INTRAVENOUS | Status: DC | PRN
  Administered 2023-02-05: 20 mg via INTRAVENOUS
  Administered 2023-02-05: 50 mg via INTRAVENOUS

## 2023-02-05 MED ORDER — PROPOFOL 10 MG/ML IV BOLUS
INTRAVENOUS | Status: DC | PRN
  Administered 2023-02-05: 70 mg via INTRAVENOUS

## 2023-02-05 MED ORDER — DILTIAZEM HCL ER 60 MG PO CP12
60.0000 mg | ORAL_CAPSULE | Freq: Two times a day (BID) | ORAL | Status: DC
  Administered 2023-02-05 – 2023-02-09 (×5): 60 mg via ORAL
  Filled 2023-02-05 (×9): qty 1

## 2023-02-05 MED ORDER — ONDANSETRON HCL 4 MG/2ML IJ SOLN: 4.0000 mg | Freq: Four times a day (QID) | INTRAMUSCULAR | Status: DC | PRN

## 2023-02-05 MED ORDER — ACETAMINOPHEN 500 MG PO TABS: 1000.0000 mg | ORAL_TABLET | Freq: Once | ORAL | Status: DC | PRN

## 2023-02-05 MED ORDER — DEXTROSE-SODIUM CHLORIDE 5-0.45 % IV SOLN: INTRAVENOUS | Status: DC

## 2023-02-05 MED ORDER — ORAL CARE MOUTH RINSE: 15.0000 mL | Freq: Once | OROMUCOSAL | Status: AC

## 2023-02-05 MED ORDER — MENTHOL 3 MG MT LOZG: 1.0000 | LOZENGE | OROMUCOSAL | Status: DC | PRN

## 2023-02-05 MED ORDER — CHLORHEXIDINE GLUCONATE 0.12 % MT SOLN: 15.0000 mL | Freq: Once | OROMUCOSAL | Status: AC

## 2023-02-05 MED ORDER — CARBIDOPA-LEVODOPA ER 50-200 MG PO TBCR
1.0000 | EXTENDED_RELEASE_TABLET | Freq: Every day | ORAL | Status: DC
  Filled 2023-02-05: qty 1

## 2023-02-05 MED ORDER — TRANEXAMIC ACID-NACL 1000-0.7 MG/100ML-% IV SOLN
1000.0000 mg | Freq: Once | INTRAVENOUS | Status: AC
  Administered 2023-02-05: 1000 mg via INTRAVENOUS
  Filled 2023-02-05: qty 100

## 2023-02-05 MED ORDER — CALCIUM CHLORIDE 10 % IV SOLN
INTRAVENOUS | Status: DC | PRN
  Administered 2023-02-05: 250 mg via INTRAVENOUS

## 2023-02-05 MED ORDER — CEFAZOLIN SODIUM-DEXTROSE 2-4 GM/100ML-% IV SOLN
2.0000 g | Freq: Four times a day (QID) | INTRAVENOUS | Status: AC
  Administered 2023-02-05 – 2023-02-06 (×2): 2 g via INTRAVENOUS
  Filled 2023-02-05 (×2): qty 100

## 2023-02-05 MED ORDER — ACETAMINOPHEN 500 MG PO TABS: 500.0000 mg | ORAL_TABLET | Freq: Four times a day (QID) | ORAL | Status: DC | PRN

## 2023-02-05 MED ORDER — PHENYLEPHRINE 80 MCG/ML (10ML) SYRINGE FOR IV PUSH (FOR BLOOD PRESSURE SUPPORT)
PREFILLED_SYRINGE | INTRAVENOUS | Status: DC | PRN
  Administered 2023-02-05 (×4): 160 ug via INTRAVENOUS

## 2023-02-05 MED ORDER — SODIUM CHLORIDE 0.9 % IV SOLN: INTRAVENOUS | Status: DC | PRN

## 2023-02-05 MED ORDER — SUGAMMADEX SODIUM 200 MG/2ML IV SOLN
INTRAVENOUS | Status: DC | PRN
  Administered 2023-02-05: 300 mg via INTRAVENOUS

## 2023-02-05 MED ORDER — ACETAMINOPHEN 10 MG/ML IV SOLN: 1000.0000 mg | Freq: Once | INTRAVENOUS | Status: DC | PRN

## 2023-02-05 SURGICAL SUPPLY — 56 items
BAG COUNTER SPONGE SURGICOUNT (BAG) IMPLANT
BENZOIN TINCTURE PRP APPL 2/3 (GAUZE/BANDAGES/DRESSINGS) ×1 IMPLANT
BIPOLAR DEPUY 50 (Hips) ×1 IMPLANT
BLADE CLIPPER SURG (BLADE) IMPLANT
BLADE SAW SGTL 18X1.27X75 (BLADE) ×1 IMPLANT
BNDG COHESIVE 6X5 TAN ST LF (GAUZE/BANDAGES/DRESSINGS) IMPLANT
BNDG GAUZE DERMACEA FLUFF 4 (GAUZE/BANDAGES/DRESSINGS) IMPLANT
CLSR STERI-STRIP ANTIMIC 1/2X4 (GAUZE/BANDAGES/DRESSINGS) IMPLANT
COVER PERINEAL POST (MISCELLANEOUS) ×1 IMPLANT
COVER SURGICAL LIGHT HANDLE (MISCELLANEOUS) ×1 IMPLANT
DRAPE C-ARM 42X72 X-RAY (DRAPES) ×1 IMPLANT
DRAPE STERI IOBAN 125X83 (DRAPES) ×1 IMPLANT
DRAPE U-SHAPE 47X51 STRL (DRAPES) ×2 IMPLANT
DRSG AQUACEL AG ADV 3.5X 6 (GAUZE/BANDAGES/DRESSINGS) IMPLANT
DRSG AQUACEL AG ADV 3.5X10 (GAUZE/BANDAGES/DRESSINGS) ×1 IMPLANT
DURAPREP 26ML APPLICATOR (WOUND CARE) ×1 IMPLANT
ELECT BLADE 4.0 EZ CLEAN MEGAD (MISCELLANEOUS)
ELECT CAUTERY BLADE 6.4 (BLADE) ×1 IMPLANT
ELECT REM PT RETURN 9FT ADLT (ELECTROSURGICAL) ×1
ELECTRODE BLDE 4.0 EZ CLN MEGD (MISCELLANEOUS) IMPLANT
ELECTRODE REM PT RTRN 9FT ADLT (ELECTROSURGICAL) ×1 IMPLANT
GLOVE BIOGEL PI IND STRL 8 (GLOVE) ×2 IMPLANT
GLOVE ECLIPSE 7.5 STRL STRAW (GLOVE) ×2 IMPLANT
GOWN STRL REUS W/ TWL LRG LVL3 (GOWN DISPOSABLE) ×2 IMPLANT
GOWN STRL REUS W/ TWL XL LVL3 (GOWN DISPOSABLE) ×2 IMPLANT
GOWN STRL REUS W/TWL LRG LVL3 (GOWN DISPOSABLE) ×2
GOWN STRL REUS W/TWL XL LVL3 (GOWN DISPOSABLE) ×2
HEAD BIPOLAR DEPUY 50 (Hips) IMPLANT
HEAD FEM STD 28X+1.5 STRL (Hips) IMPLANT
HOOD W/PEELAWAY (MISCELLANEOUS) ×2 IMPLANT
KIT BASIN OR (CUSTOM PROCEDURE TRAY) ×1 IMPLANT
KIT TURNOVER KIT B (KITS) ×1 IMPLANT
MANIFOLD NEPTUNE II (INSTRUMENTS) ×1 IMPLANT
NDL SPNL 22GX3.5 QUINCKE BK (NEEDLE) ×1 IMPLANT
NEEDLE SPNL 22GX3.5 QUINCKE BK (NEEDLE) ×1 IMPLANT
NS IRRIG 1000ML POUR BTL (IV SOLUTION) ×1 IMPLANT
PACK TOTAL JOINT (CUSTOM PROCEDURE TRAY) ×1 IMPLANT
PACK UNIVERSAL I (CUSTOM PROCEDURE TRAY) ×1 IMPLANT
PAD ARMBOARD 7.5X6 YLW CONV (MISCELLANEOUS) ×2 IMPLANT
SPONGE T-LAP 18X18 ~~LOC~~+RFID (SPONGE) IMPLANT
STAPLER VISISTAT 35W (STAPLE) IMPLANT
STEM FEM ACTIS STD SZ7 (Nail) IMPLANT
SUT ETHIBOND NAB CT1 #1 30IN (SUTURE) ×2 IMPLANT
SUT MNCRL AB 3-0 PS2 18 (SUTURE) IMPLANT
SUT VIC AB 0 CT1 27 (SUTURE) ×2
SUT VIC AB 0 CT1 27XBRD ANBCTR (SUTURE) ×2 IMPLANT
SUT VIC AB 1 CT1 27 (SUTURE) ×2
SUT VIC AB 1 CT1 27XBRD ANBCTR (SUTURE) ×2 IMPLANT
SUT VIC AB 2-0 CT1 27 (SUTURE) ×2
SUT VIC AB 2-0 CT1 TAPERPNT 27 (SUTURE) ×1 IMPLANT
SYR 50ML LL SCALE MARK (SYRINGE) ×1 IMPLANT
TOWEL GREEN STERILE (TOWEL DISPOSABLE) ×1 IMPLANT
TOWEL GREEN STERILE FF (TOWEL DISPOSABLE) ×1 IMPLANT
TRAY CATH INTERMITTENT SS 16FR (CATHETERS) IMPLANT
TRAY FOLEY W/BAG SLVR 16FR (SET/KITS/TRAYS/PACK)
TRAY FOLEY W/BAG SLVR 16FR ST (SET/KITS/TRAYS/PACK) IMPLANT

## 2023-02-05 NOTE — Transfer of Care (Signed)
Immediate Anesthesia Transfer of Care Note  Patient: [name] [name]  Procedure(s) Performed: ANTERIOR APPROACH HEMI HIP ARTHROPLASTY (Right)  Patient Location: PACU  Anesthesia Type:General  Level of Consciousness: awake and alert   Airway & Oxygen Therapy: Patient Spontanous Breathing and Patient connected to nasal cannula oxygen  Post-op Assessment: Report given to RN and Post -op Vital signs reviewed and stable  Post vital signs: Reviewed and stable  Last Vitals:  Vitals Value Taken Time  BP 117/95 02/05/23 1630  Temp    Pulse 126 02/05/23 1631  Resp 29 02/05/23 1631  SpO2 96 % 02/05/23 1631  Vitals shown include unvalidated device data.  Last Pain:  Vitals:   02/05/23 1309  TempSrc: Oral  PainSc:          Complications: No notable events documented.

## 2023-02-05 NOTE — Progress Notes (Signed)
Pt placed on bedpan at 1730. Pt given miralx PRN and requested to stay on bedpan. Pt and family members educated on possibility of skin sore. All 4 side rails requested to be up.

## 2023-02-05 NOTE — Discharge Instructions (Signed)

## 2023-02-05 NOTE — Care Management Important Message (Signed)
Important Message  Patient Details  Name: [name] [name] MRN: [id number] Date of Birth: 12-Feb-1941   Medicare Important Message Given:  Yes     Sherilyn Banker 02/05/2023, 3:17 PM

## 2023-02-05 NOTE — Anesthesia Preprocedure Evaluation (Addendum)
Anesthesia Evaluation  Patient identified by MRN, date of birth, ID band Patient awake    Reviewed: Allergy & Precautions, NPO status , Patient's Chart, lab work & pertinent test results  Airway Mallampati: II  TM Distance: >3 FB Neck ROM: Full    Dental no notable dental hx. (+) Teeth Intact, Dental Advisory Given   Pulmonary sleep apnea (BiPAP) , former smoker   Pulmonary exam normal breath sounds clear to auscultation       Cardiovascular Normal cardiovascular exam+ dysrhythmias Atrial Fibrillation  Rhythm:Regular Rate:Normal  TTE 2020  1. Left ventricular ejection fraction, by visual estimation, is 50 to  55%. The left ventricle has low normal function. Left ventricular septal  wall thickness was moderately increased. Moderately increased left  ventricular posterior wall thickness. There   is moderately increased left ventricular hypertrophy.   2. Left ventricular diastolic parameters are consistent with Grade I  diastolic dysfunction (impaired relaxation).   3. The left ventricle has no regional wall motion abnormalities.   4. Global right ventricle has normal systolic function.The right  ventricular size is normal. No increase in right ventricular wall  thickness.   5. Left atrial size was normal.   6. Right atrial size was mildly dilated.   7. The mitral valve was not well visualized. Trivial mitral valve  regurgitation.   8. The tricuspid valve is not well visualized. Tricuspid valve  regurgitation is trivial.   9. The aortic valve was not well visualized. Aortic valve regurgitation  is trivial.  10. The pulmonic valve was not well visualized. Pulmonic valve  regurgitation is trivial.  11. The aortic root was not well visualized.  12. Normal pulmonary artery systolic pressure.  13. The atrial septum is grossly normal.     Neuro/Psych Parkinson's negative neurological ROS  negative psych ROS    GI/Hepatic negative GI ROS, Neg liver ROS,,,  Endo/Other  negative endocrine ROS    Renal/GU negative Renal ROS  negative genitourinary   Musculoskeletal negative musculoskeletal ROS (+)    Abdominal   Peds  Hematology  (+) Blood dyscrasia (eliquis, last dose 02/03/23)   Anesthesia Other Findings 82 year old elderly Caucasian male with a past medical history significant for but limited to prostate cancer, history of pancreatic cancer, paroxysmal atrial fibrillation on anticoagulation with Eliquis, Parkinson disease, sleep apnea as well as other comorbidities including BPH who presented to the hospital with a traumatic right hip fracture  Reproductive/Obstetrics                             Anesthesia Physical Anesthesia Plan  ASA: 3  Anesthesia Plan: General   Post-op Pain Management:    Induction: Intravenous  PONV Risk Score and Plan: Midazolam, Dexamethasone and Ondansetron  Airway Management Planned: Oral ETT  Additional Equipment:   Intra-op Plan:   Post-operative Plan: Extubation in OR  Informed Consent: I have reviewed the patients History and Physical, chart, labs and discussed the procedure including the risks, benefits and alternatives for the proposed anesthesia with the patient or authorized representative who has indicated his/her understanding and acceptance.     Dental advisory given  Plan Discussed with: CRNA  Anesthesia Plan Comments:        Anesthesia Quick Evaluation

## 2023-02-05 NOTE — Anesthesia Procedure Notes (Signed)
Procedure Name: Intubation Date/Time: 02/05/2023 2:20 PM  Performed by: Darryl Nestle, CRNAPre-anesthesia Checklist: Patient identified, Emergency Drugs available, Suction available and Patient being monitored Patient Re-evaluated:Patient Re-evaluated prior to induction Oxygen Delivery Method: Circle system utilized Preoxygenation: Pre-oxygenation with 100% oxygen Induction Type: IV induction Ventilation: Mask ventilation without difficulty Laryngoscope Size: Mac and 3 Grade View: Grade I Tube type: Oral Tube size: 7.0 mm Number of attempts: 1 Airway Equipment and Method: Stylet and Oral airway Placement Confirmation: ETT inserted through vocal cords under direct vision, positive ETCO2 and breath sounds checked- equal and bilateral Secured at: 23 cm Tube secured with: Tape Dental Injury: Teeth and Oropharynx as per pre-operative assessment

## 2023-02-05 NOTE — Op Note (Signed)
PATIENT ID:      [name] [name]  MRN:     [id number] DOB/AGE:    1941/03/25 / 82 y.o.       OPERATIVE REPORT    DATE OF PROCEDURE:  02/05/2023       PREOPERATIVE DIAGNOSIS:  RIGHT FEMORAL NECK FRACTURE                                                       Estimated body mass index is 25.29 kg/m as calculated from the following:   Height as of this encounter: 5\' 5"  (1.651 m).   Weight as of this encounter: 68.9 kg.     POSTOPERATIVE DIAGNOSIS:  RIGHT FEMORAL NECK FRACTURE                                                           PROCEDURE:  1.  Right bipolar Hemi arthroplasty using a , #7 Actis stem, 28 mm internal ball with 1.5 neck length, 58 mm hemiarthroplasty metal ball. 2.interpretation of multiple intraoperative fluoroscopic images   SURGEON: Harvie Junior    ASSISTANT:   Gus Puma PA-C  (present throughout entire procedure and necessary for timely completion of the procedure)  ANESTHESIA: General BLOOD LOSS: 400 cc Tranexamic Acid: 1 gram IV DRAINS: None COMPLICATIONS: None    NDICATIONS FOR PROCEDURE: Patient is a 82 year old male with multiple medical issues who fell and suffered a right femoral neck fracture.  He was admitted to the hospital via the hospitalist service and allowed 2 days for Eliquis to come out of the system.  We have had long discussions about treatment options but felt that hemiarthroplasty was the most appropriate course of action given his level of activity and desire to be mobile.  The risks, benefits, and alternatives were discussed at length including but not limited to the risks of infection, bleeding, nerve injury, stiffness, blood clots, the need for revision surgery, cardiopulmonary complications, among others, and they were willing to proceed.Benefits have been discussed. Questions answered.     PROCEDURE IN DETAIL: The patient was identified by armband,  received preoperative IV antibiotics in the holding area at [facility], taken to  the operating room , appropriate anesthetic monitors  were attached and spinal anesthesia was induced.  The patient was placed onto the hot bed and all bony prominences were well-padded.The right hip was prepped and draped for an anterior approach to the hip.  An incision was made and the subcutaneous dissection was down to the level of the tensor fascia.  The fascia was opened and finger dissected.  The bleeders coming across the anterior portion of the hip were identified and cauterized. Retractors were put in place above and below the femoral neck.  A neck cut was made at the appropriate level and rondure used to remove the portion of the neck between the cut and the femoral neck fracture.  The head was then removed and sized on the back table to 50.  The acetabulum was inspected and noted to have no significant areas of arthritic wear.  The ball on a stick size 50 was placed  into the acetabulum noted to have good suction fit and good mobility.  Attention was turned towards the femur where the leg was actually rotated, extended, and adduction did.  The femur was sequentially broached until a size of 7 broach gave a perfect fit and fill.at this point a 36 mm head was used and images were undertaken.  Math was performed showing that we needed to shorten the femoral neck about 5 mm.  And this was undertaken with sequential broaching down to the appropriate level.  The 28 mm head and 1.5 neck length and 50 mm bipolar head were opened and placed and the hip was reduced at this point.  Final fluoroscopic images were taken showing excellent fit and fill of the stem and appropriate and symmetric leg lengths.     The capsule was closed with #1 Vicryl suture.  The tensor fascia was closed with 0 Vicryl suture.  The skin was then closed with combination of 0 and 2-0 Vicryl suture.  The top layer was with 3-0 Monocryl suture.  Benzoin and Steri-Strips were applied  and a sterile compressive dressing was applied and the  patient taken to recovery room she noted be in satisfactory condition.  Blood loss for the procedure was approximately 400 cc.  Of note l Gus Puma was present the entire case and assisted by retraction of tissues, manipulation of the leg, and closing the minimize or time.   Harvie Junior 02/05/2023, 3:45 PM

## 2023-02-05 NOTE — Plan of Care (Signed)
  Problem: Education: Goal: Knowledge of General Education information will improve Description: Including pain rating scale, medication(s)/side effects and non-pharmacologic comfort measures Outcome: Progressing   Problem: Health Behavior/Discharge Planning: Goal: Ability to manage health-related needs will improve Outcome: Progressing   Problem: Clinical Measurements: Goal: Ability to maintain clinical measurements within normal limits will improve Outcome: Progressing   Problem: Clinical Measurements: Goal: Diagnostic test results will improve Outcome: Progressing   Problem: Activity: Goal: Risk for activity intolerance will decrease Outcome: Progressing   Problem: Pain Managment: Goal: General experience of comfort will improve Outcome: Progressing   

## 2023-02-05 NOTE — Progress Notes (Signed)
Subjective: 82 year old male with significant medical comorbidity who has fallen and suffered a femoral neck fracture.  He was on Eliquis and needed some time to equilibrate in terms of concern for additional blood loss.  He continues to complain of right hip pain and difficulty with movement in bed.  We were consulted for evaluation and felt that hemiarthroplasty would be the appropriate course of action.  We discussed that with he and his son and they were well aware of that and at this point we are ready to perform surgery as he is cleared by his medical service and has been off Eliquis for 48 hours.   Objective: Vital signs in last 24 hours: Temp:  [98 F (36.7 C)-98.7 F (37.1 C)] 98.2 F (36.8 C) (05/31 1309) Pulse Rate:  [53-61] 61 (05/31 1309) Resp:  [16-29] 18 (05/31 1309) BP: (104-142)/(67-83) 142/83 (05/31 1309) SpO2:  [95 %-96 %] 96 % (05/31 1309) Weight:  [68.9 kg] 68.9 kg (05/31 1309)  Intake/Output from previous day: 05/30 0701 - 05/31 0700 In: -  Out: 500 [Urine:500] Intake/Output this shift: No intake/output data recorded.  Recent Labs    02/03/23 1519 02/04/23 0135 02/05/23 0317  HGB 12.5* 12.0* 12.5*   Recent Labs    02/04/23 0135 02/05/23 0317  WBC 7.7 6.8  RBC 4.07* 4.26  HCT 37.2* 38.8*  PLT 187 168   Recent Labs    02/04/23 0135 02/05/23 0317  NA 135 137  K 4.0 4.1  CL 102 103  CO2 24 23  BUN 16 18  CREATININE 1.11 1.12  GLUCOSE 140* 114*  CALCIUM 9.1 8.8*   Recent Labs    02/03/23 1519  INR 1.2    Neurologically intact ABD soft Neurovascular intact Sensation intact distally Intact pulses distally No cellulitis present Compartment soft Right hip with pain with all range of motion.  Neurovascular intact distally.  There is moderate soft tissue swelling.    Assessment/Plan: 82 year old male with significant medical issues including pancreatic cancer and prostate cancer with other significant comorbidities who has a new onset  femoral neck fracture.  Patient is scheduled for hemiarthroplasty right side and we believe this is the appropriate course of action for the patient.  He and his son are well aware of the risk of bleeding, infection, need for further surgery, dislocation, and failure of the surgery to help the pain.  They are aware of the slight risk of death in and around the time of surgery but wished to proceed based on desire to be mobile as quickly as possible.     Harvie Junior 02/05/2023, 1:53 PM

## 2023-02-05 NOTE — Hospital Course (Signed)
The patient is an 82 year old elderly Caucasian male with a past medical history significant for but limited to prostate cancer, history of pancreatic cancer, paroxysmal atrial fibrillation on anticoagulation with Eliquis, Parkinson disease, sleep apnea as well as other comorbidities including BPH who presented to the hospital with a traumatic right hip fracture.  Patient was seen in the usual state of health yesterday morning while he was carrying a tray when he suddenly slipped on his wooden floor with his socks and fell on his right hip.  He denies any loss of consciousness and did not hit his head.  Given his fall and right hip pain EMS was called and he was brought to the emergency department and x-ray was done which confirmed a displaced closed right femoral neck fracture.  Orthopedic surgery was consulted and hospitalist admit this patient and surgical.  Being planned but since patient has been on anticoagulation will need to have it washed out.  Timing of surgery has been deferred to orthopedic surgery and they will do it once operative time is available but it appears that he is rescheduled for 145 this afternoon.   Assessment and Plan:   Displaced Fracture of Right Femoral Neck (HCC) -Inpatient admission to Medical Telemetry -Patient had a mechanical fall and slipped on his wooden floor with his socks while he was carrying a tray -DG Hip done and showed "Acute displaced fracture is seen in the neck of proximal right femur." -Pain control with Acetaminophen 650 mg po/RC q6hprn Mild Pain or Fever >/= 101, Oxycodone IR 2.5-5 mg po q6hprn Severe Pain, and IV Hydromorphone 0.5 mg q4hprn Severe Pain -Bowel Regimen with po Docusate 100 mg po BID and Miralax 17 grams po Dailyprn -VTE prophylaxis with enoxaparin 40 mg subcu every 24 for now -Orthopedic surgery consulted and formal evaluation is pending but patient will need a right hip arthroplasty and due to him being on Eliquis and OR availability,  timing deferred to the orthopedic surgeon and per Dr. Luiz Blare they will proceed with a hemiarthroplasty once operative time is available; per report surgery is scheduled for 1:45 this afternoon   Hx of Pancreatic Cancer (HCC)  -On surveillance after completing chemo 05/2022 -Plan for repeat CT scan with Dr. Mosetta Putt in July 2024. -C/w Lipase/Protease/Amylase Capsules 72,000 Units po TID    Prostate Cancer  -On Eligard q6 months   Paroxysmal A-fib (HCC) -Currently in normal sinus rhythm -Continue Telemetry Monitoring -Continue to Hold Apixaban at this time in anticipation for Surgery -C/w Diltiazem 60 mg po BID -Continue with metoprolol tartrate 5 mg IV every 6 as needed for high blood pressure   Parkinson's Disease  -C/w Carbidopa-Levodopa 50-200 mg 1 tab po qHS as well as Carbidopa-Levodopa 25-100 mg 3 Tab BID at 0700 and Carbidopa-Levodopa 25-100 mg 2 Tab BID at 1900 -Continue with Clonazepam 0.5 mg p.o. nightly   GERD/GI Prophylaxis -C/w Pantoprazole 40 mg po Daily   HLD -C/w Rosuvastatin 10 mg po Daily   Hyperbilirubinemia -Likely Reactive. T Bili Trend: Recent Labs  Lab 01/15/23 1304 02/05/23 0317  BILITOT 1.0 1.3*  -Continue to Monitor and Trend and repeat CMP in the AM  Normocytic Anemia -Hgb/Hct Trend: Recent Labs  Lab 01/15/23 1304 01/18/23 0953 02/03/23 1519 02/04/23 0135 02/05/23 0317  HGB 12.8* 14.8 12.5* 12.0* 12.5*  HCT 38.6* 46.3 39.8 37.2* 38.8*  MCV 92.6 93.5 94.3 91.4 91.1  -Check Anemia Panel in the AM  -Expect Post-operative Drop -Continue to Montior for S/Sx of Bleeding; No overt  bleeding noted -Repeat CBC in the AM  Hypoalbuminemia -Patient's Albumin Trend: Recent Labs  Lab 01/15/23 1304 02/05/23 0317  ALBUMIN 4.0 3.2*  -Continue to Monitor and Trend and repeat CMP in the AM

## 2023-02-05 NOTE — Progress Notes (Signed)
PROGRESS NOTE    [name] [name]  WUJ:[id number] DOB: 07-15-1941 DOA: 02/03/2023 PCP: Lorenda Ishihara, MD   Brief Narrative:  The patient is an 82 year old elderly Caucasian male with a past medical history significant for but limited to prostate cancer, history of pancreatic cancer, paroxysmal atrial fibrillation on anticoagulation with Eliquis, Parkinson disease, sleep apnea as well as other comorbidities including BPH who presented to the hospital with a traumatic right hip fracture.  Patient was seen in the usual state of health yesterday morning while he was carrying a tray when he suddenly slipped on his wooden floor with his socks and fell on his right hip.  He denies any loss of consciousness and did not hit his head.  Given his fall and right hip pain EMS was called and he was brought to the emergency department and x-ray was done which confirmed a displaced closed right femoral neck fracture.  Orthopedic surgery was consulted and hospitalist admit this patient and surgical.  Being planned but since patient has been on anticoagulation will need to have it washed out.  Timing of surgery has been deferred to orthopedic surgery and they will do it once operative time is available but it appears that he is rescheduled for 145 this afternoon.   Assessment and Plan:   Displaced Fracture of Right Femoral Neck (HCC) -Inpatient admission to Medical Telemetry -Patient had a mechanical fall and slipped on his wooden floor with his socks while he was carrying a tray -DG Hip done and showed "Acute displaced fracture is seen in the neck of proximal right femur." -Pain control with Acetaminophen 650 mg po/RC q6hprn Mild Pain or Fever >/= 101, Oxycodone IR 2.5-5 mg po q6hprn Severe Pain, and IV Hydromorphone 0.5 mg q4hprn Severe Pain -Bowel Regimen with po Docusate 100 mg po BID and Miralax 17 grams po Dailyprn -VTE prophylaxis with enoxaparin 40 mg subcu every 24 for now -Orthopedic surgery  consulted and formal evaluation is pending but patient will need a right hip arthroplasty and due to him being on Eliquis and OR availability, timing deferred to the orthopedic surgeon and per Dr. Luiz Blare they will proceed with a hemiarthroplasty once operative time is available; per report surgery is scheduled for 1:45 this afternoon   Hx of Pancreatic Cancer (HCC)  -On surveillance after completing chemo 05/2022 -Plan for repeat CT scan with Dr. Mosetta Putt in July 2024. -C/w Lipase/Protease/Amylase Capsules 72,000 Units po TID    Prostate Cancer  -On Eligard q6 months   Paroxysmal A-fib (HCC) -Currently in normal sinus rhythm -Continue Telemetry Monitoring -Continue to Hold Apixaban at this time in anticipation for Surgery -C/w Diltiazem 60 mg po BID -Continue with metoprolol tartrate 5 mg IV every 6 as needed for high blood pressure   Parkinson's Disease  -C/w Carbidopa-Levodopa 50-200 mg 1 tab po qHS as well as Carbidopa-Levodopa 25-100 mg 3 Tab BID at 0700 and Carbidopa-Levodopa 25-100 mg 2 Tab BID at 1900 -Continue with Clonazepam 0.5 mg p.o. nightly   GERD/GI Prophylaxis -C/w Pantoprazole 40 mg po Daily   HLD -C/w Rosuvastatin 10 mg po Daily   Hyperbilirubinemia -Likely Reactive. T Bili Trend: Recent Labs  Lab 01/15/23 1304 02/05/23 0317  BILITOT 1.0 1.3*  -Continue to Monitor and Trend and repeat CMP in the AM  Normocytic Anemia -Hgb/Hct Trend: Recent Labs  Lab 01/15/23 1304 01/18/23 0953 02/03/23 1519 02/04/23 0135 02/05/23 0317  HGB 12.8* 14.8 12.5* 12.0* 12.5*  HCT 38.6* 46.3 39.8 37.2* 38.8*  MCV 92.6 93.5  94.3 91.4 91.1  -Check Anemia Panel in the AM  -Expect Post-operative Drop -Continue to Montior for S/Sx of Bleeding; No overt bleeding noted -Repeat CBC in the AM  Hypoalbuminemia -Patient's Albumin Trend: Recent Labs  Lab 01/15/23 1304 02/05/23 0317  ALBUMIN 4.0 3.2*  -Continue to Monitor and Trend and repeat CMP in the AM   DVT prophylaxis: TED  hose to OR Start: 02/05/23 0531 enoxaparin (LOVENOX) injection 40 mg Start: 02/03/23 1645 SCDs Start: 02/03/23 1643    Code Status: Full Code Family Communication: No family currently at bedside  Disposition Plan:  Level of care: Telemetry Medical Status is: Inpatient Remains inpatient appropriate because: Is undergoing surgical intervention this afternoon and will need PT OT to further evaluate and treat postoperatively   Consultants:  Orthopedic surgery  Procedures:  As delineated as above  Antimicrobials:  Anti-infectives (From admission, onward)    Start     Dose/Rate Route Frequency Ordered Stop   02/05/23 0630  ceFAZolin (ANCEF) IVPB 2g/100 mL premix        2 g 200 mL/hr over 30 Minutes Intravenous On call to O.R. 02/05/23 0530 02/06/23 0559       Subjective: Seen and examined at bedside and she was doing okay states that he had a place in his groin which was itching him.  No nausea or vomiting.  Feels a little bit hungry as he is NPO.  States that Dr. Luiz Blare is going to do his surgery today.  States that he slept on and off.  No other concerns or complaints this time.  Objective: Vitals:   02/04/23 1615 02/04/23 2051 02/05/23 0547 02/05/23 0840  BP:  108/67 104/72 111/67  Pulse:  (!) 56 (!) 54 (!) 53  Resp:  (!) 29 17 16   Temp: 98.1 F (36.7 C) 98.7 F (37.1 C) 98 F (36.7 C) 98.3 F (36.8 C)  TempSrc: Oral Oral Oral Oral  SpO2:  95% 95% 96%    Intake/Output Summary (Last 24 hours) at 02/05/2023 1120 Last data filed at 02/05/2023 0548 Gross per 24 hour  Intake --  Output 200 ml  Net -200 ml   There were no vitals filed for this visit.  Examination: Physical Exam:  Constitutional: Thin elderly Caucasian male in no acute distress Respiratory: Diminished to auscultation bilaterally, no wheezing, rales, rhonchi or crackles. Normal respiratory effort and patient is not tachypenic. No accessory muscle use.  Unlabored breathing Cardiovascular: RRR, no murmurs  / rubs / gallops. S1 and S2 auscultated. No extremity edema.  Abdomen: Soft, non-tender, non-distended. Bowel sounds positive.  GU: Deferred. Musculoskeletal: No clubbing / cyanosis of digits/nails.  Right leg is shorter and externally rotated compared to the left Skin: No rashes, lesions, ulcers on limited skin evaluation. No induration; Warm and dry.  Neurologic: CN 2-12 grossly intact with no focal deficits. Romberg sign and cerebellar reflexes not assessed.  Psychiatric: Normal judgment and insight. Alert and oriented x 3. Normal mood and appropriate affect.   Data Reviewed: I have personally reviewed following labs and imaging studies  CBC: Recent Labs  Lab 02/03/23 1519 02/04/23 0135 02/05/23 0317  WBC 5.8 7.7 6.8  NEUTROABS 4.0  --  4.6  HGB 12.5* 12.0* 12.5*  HCT 39.8 37.2* 38.8*  MCV 94.3 91.4 91.1  PLT 180 187 168   Basic Metabolic Panel: Recent Labs  Lab 02/03/23 1519 02/04/23 0135 02/05/23 0317  NA 137 135 137  K 3.8 4.0 4.1  CL 104 102 103  CO2 24  24 23  GLUCOSE 114* 140* 114*  BUN 17 16 18   CREATININE 1.11 1.11 1.12  CALCIUM 8.9 9.1 8.8*  MG  --   --  2.1  PHOS  --   --  2.6   GFR: Estimated Creatinine Clearance: 45 mL/min (by C-G formula based on SCr of 1.12 mg/dL). Liver Function Tests: Recent Labs  Lab 02/05/23 0317  AST 22  ALT 6  ALKPHOS 47  BILITOT 1.3*  PROT 6.1*  ALBUMIN 3.2*   No results for input(s): "LIPASE", "AMYLASE" in the last 168 hours. No results for input(s): "AMMONIA" in the last 168 hours. Coagulation Profile: Recent Labs  Lab 02/03/23 1519  INR 1.2   Cardiac Enzymes: No results for input(s): "CKTOTAL", "CKMB", "CKMBINDEX", "TROPONINI" in the last 168 hours. BNP (last 3 results) No results for input(s): "PROBNP" in the last 8760 hours. HbA1C: No results for input(s): "HGBA1C" in the last 72 hours. CBG: No results for input(s): "GLUCAP" in the last 168 hours. Lipid Profile: No results for input(s): "CHOL", "HDL",  "LDLCALC", "TRIG", "CHOLHDL", "LDLDIRECT" in the last 72 hours. Thyroid Function Tests: No results for input(s): "TSH", "T4TOTAL", "FREET4", "T3FREE", "THYROIDAB" in the last 72 hours. Anemia Panel: No results for input(s): "VITAMINB12", "FOLATE", "FERRITIN", "TIBC", "IRON", "RETICCTPCT" in the last 72 hours. Sepsis Labs: No results for input(s): "PROCALCITON", "LATICACIDVEN" in the last 168 hours.  Recent Results (from the past 240 hour(s))  MRSA Next Gen by PCR, Nasal     Status: None   Collection Time: 02/04/23  5:57 AM   Specimen: Nasal Mucosa; Nasal Swab  Result Value Ref Range Status   MRSA by PCR Next Gen NOT DETECTED NOT DETECTED Final    Comment: (NOTE) The GeneXpert MRSA Assay (FDA approved for NASAL specimens only), is one component of a comprehensive MRSA colonization surveillance program. It is not intended to diagnose MRSA infection nor to guide or monitor treatment for MRSA infections. Test performance is not FDA approved in patients less than 39 years old. Performed at [facility], [address] [place], [place] [address]     Radiology Studies: DG Chest 2 View  Result Date: 02/05/2023 CLINICAL DATA:  Preop chest exam. EXAM: CHEST - 2 VIEW COMPARISON:  Chest x-ray Feb 03, 2023. FINDINGS: The heart size and mediastinal contours are unchanged. Both lungs are clear. No visible pleural effusions or pneumothorax. No acute osseous abnormality. Prior thoracic kyphoplasty for compression fracture with similar height loss. IMPRESSION: No active cardiopulmonary disease. Electronically Signed   By: Feliberto Harts M.D.   On: 02/05/2023 08:55   DG Chest 1 View  Result Date: 02/03/2023 CLINICAL DATA:  Fracture right hip EXAM: CHEST  1 VIEW COMPARISON:  01/18/2023 FINDINGS: Transverse diameter of heart is increased. Thoracic aorta is tortuous and ectatic. Lung fields are clear of any infiltrates or pulmonary edema. There is no pleural effusion or pneumothorax. There is  vertebroplasty in multiple thoracic and upper lumbar vertebrae. IMPRESSION: There are no focal infiltrates or signs of pulmonary edema. Electronically Signed   By: Ernie Avena M.D.   On: 02/03/2023 16:12   DG Hip Unilat With Pelvis 2-3 Views Right  Result Date: 02/03/2023 CLINICAL DATA:  Trauma, fall EXAM: DG HIP (WITH OR WITHOUT PELVIS) 2-3V RIGHT COMPARISON:  None Available. FINDINGS: Acute fracture is seen in the neck of right femur. There is overriding of fracture fragments. There is no dislocation. IMPRESSION: Acute displaced fracture is seen in the neck of proximal right femur. Electronically Signed  By: Ernie Avena M.D.   On: 02/03/2023 16:11    Scheduled Meds:  bupivacaine liposome  10 mL Other Once   carbidopa-levodopa  1 tablet Oral QHS   carbidopa-levodopa  3 tablet Oral BID   And   carbidopa-levodopa  2 tablet Oral BID   clonazePAM  0.5 mg Oral QHS   cyanocobalamin  1,000 mcg Oral Daily   diltiazem  60 mg Oral BID   docusate sodium  100 mg Oral BID   enoxaparin (LOVENOX) injection  40 mg Subcutaneous Q24H   lipase/protease/amylase  72,000 Units Oral TID with meals   pantoprazole  40 mg Oral Daily   povidone-iodine  2 Application Topical Once   rosuvastatin  10 mg Oral Daily   terbinafine  1 Application Topical BID   Continuous Infusions:   ceFAZolin (ANCEF) IV     tranexamic acid      LOS: 2 days   Marguerita Merles, DO [facility] Available via Epic secure chat 7am-7pm After these hours, please refer to coverage provider listed on [email or web address] 02/05/2023, 11:20 AM

## 2023-02-06 DIAGNOSIS — S72001A Fracture of unspecified part of neck of right femur, initial encounter for closed fracture: Secondary | ICD-10-CM | POA: Diagnosis not present

## 2023-02-06 DIAGNOSIS — I480 Paroxysmal atrial fibrillation: Secondary | ICD-10-CM | POA: Diagnosis not present

## 2023-02-06 DIAGNOSIS — C259 Malignant neoplasm of pancreas, unspecified: Secondary | ICD-10-CM | POA: Diagnosis not present

## 2023-02-06 DIAGNOSIS — G20A1 Parkinson's disease without dyskinesia, without mention of fluctuations: Secondary | ICD-10-CM | POA: Diagnosis not present

## 2023-02-06 LAB — CBC WITH DIFFERENTIAL/PLATELET
Abs Immature Granulocytes: 0.02 10*3/uL (ref 0.00–0.07)
Basophils Absolute: 0 10*3/uL (ref 0.0–0.1)
Basophils Relative: 0 %
Eosinophils Absolute: 0 10*3/uL (ref 0.0–0.5)
Eosinophils Relative: 1 %
HCT: 35.4 % — ABNORMAL LOW (ref 39.0–52.0)
Hemoglobin: 11.7 g/dL — ABNORMAL LOW (ref 13.0–17.0)
Immature Granulocytes: 0 %
Lymphocytes Relative: 9 %
Lymphs Abs: 0.6 10*3/uL — ABNORMAL LOW (ref 0.7–4.0)
MCH: 30.3 pg (ref 26.0–34.0)
MCHC: 33.1 g/dL (ref 30.0–36.0)
MCV: 91.7 fL (ref 80.0–100.0)
Monocytes Absolute: 0.7 10*3/uL (ref 0.1–1.0)
Monocytes Relative: 10 %
Neutro Abs: 5.7 10*3/uL (ref 1.7–7.7)
Neutrophils Relative %: 80 %
Platelets: 172 10*3/uL (ref 150–400)
RBC: 3.86 MIL/uL — ABNORMAL LOW (ref 4.22–5.81)
RDW: 13.5 % (ref 11.5–15.5)
WBC: 7.1 10*3/uL (ref 4.0–10.5)
nRBC: 0 % (ref 0.0–0.2)

## 2023-02-06 LAB — COMPREHENSIVE METABOLIC PANEL
ALT: 6 U/L (ref 0–44)
AST: 27 U/L (ref 15–41)
Albumin: 3.1 g/dL — ABNORMAL LOW (ref 3.5–5.0)
Alkaline Phosphatase: 38 U/L (ref 38–126)
Anion gap: 9 (ref 5–15)
BUN: 13 mg/dL (ref 8–23)
CO2: 21 mmol/L — ABNORMAL LOW (ref 22–32)
Calcium: 8.3 mg/dL — ABNORMAL LOW (ref 8.9–10.3)
Chloride: 102 mmol/L (ref 98–111)
Creatinine, Ser: 1 mg/dL (ref 0.61–1.24)
GFR, Estimated: >60 mL/min (ref >60)
Glucose, Bld: 148 mg/dL — ABNORMAL HIGH (ref 70–99)
Potassium: 3.9 mmol/L (ref 3.5–5.1)
Sodium: 132 mmol/L — ABNORMAL LOW (ref 135–145)
Total Bilirubin: 1.5 mg/dL — ABNORMAL HIGH (ref 0.3–1.2)
Total Protein: 5.8 g/dL — ABNORMAL LOW (ref 6.5–8.1)

## 2023-02-06 LAB — VITAMIN B12: Vitamin B-12: 846 pg/mL (ref 180–914)

## 2023-02-06 LAB — RETICULOCYTES
Immature Retic Fract: 16.2 % — ABNORMAL HIGH (ref 2.3–15.9)
RBC.: 3.95 MIL/uL — ABNORMAL LOW (ref 4.22–5.81)
Retic Count, Absolute: 52.1 10*3/uL (ref 19.0–186.0)
Retic Ct Pct: 1.3 % (ref 0.4–3.1)

## 2023-02-06 LAB — FOLATE: Folate: 27.2 ng/mL (ref >5.9)

## 2023-02-06 LAB — IRON AND TIBC
Iron: 15 ug/dL — ABNORMAL LOW (ref 45–182)
Saturation Ratios: 6 % — ABNORMAL LOW (ref 17.9–39.5)
TIBC: 265 ug/dL (ref 250–450)
UIBC: 250 ug/dL

## 2023-02-06 LAB — PHOSPHORUS: Phosphorus: 2.7 mg/dL (ref 2.5–4.6)

## 2023-02-06 LAB — MAGNESIUM: Magnesium: 1.8 mg/dL (ref 1.7–2.4)

## 2023-02-06 LAB — FERRITIN: Ferritin: 138 ng/mL (ref 24–336)

## 2023-02-06 MED ORDER — MAGNESIUM SULFATE 2 GM/50ML IV SOLN
2.0000 g | Freq: Once | INTRAVENOUS | Status: AC
  Administered 2023-02-06: 2 g via INTRAVENOUS
  Filled 2023-02-06: qty 50

## 2023-02-06 MED ORDER — CHLORHEXIDINE GLUCONATE CLOTH 2 % EX PADS
6.0000 | MEDICATED_PAD | Freq: Every day | CUTANEOUS | Status: DC
  Administered 2023-02-06 – 2023-02-09 (×4): 6 via TOPICAL

## 2023-02-06 MED ORDER — METOPROLOL TARTRATE 5 MG/5ML IV SOLN: 5.0000 mg | Freq: Four times a day (QID) | INTRAVENOUS | Status: DC | PRN

## 2023-02-06 NOTE — Evaluation (Signed)
Physical Therapy Evaluation Patient Details Name: [name] [name] MRN: [id number] DOB: [birth year] Today's Date: 02/06/2023  History of Present Illness  [name] [name] is a 82 y.o. male admitted 5/31 after fall. Sustained right femoral neck fracture. Underwent right hemiarthroplasty on 5/31.  NWG:NFAOZHYQ cancer, pancreatic cancer, paroxysmal atrial fibrillation on Eliquis, Parkinsons  Clinical Impression  Pt admitted with above diagnosis. Pt was able to stand to RW and take a few pivotal steps to chair with +2 max assist overall.  Pt with posterior lean and needing constant assist and support. Limited by pain as well as soft BP. Pt will need therapy < 2 hours.  Sons supportive.  Pt currently with functional limitations due to the deficits listed below (see PT Problem List). Pt will benefit from acute skilled PT to increase their independence and safety with mobility to allow discharge.          Recommendations for follow up therapy are one component of a multi-disciplinary discharge planning process, led by the attending physician.  Recommendations may be updated based on patient status, additional functional criteria and insurance authorization.  Follow Up Recommendations Can patient physically be transported by private vehicle: No     Assistance Recommended at Discharge Intermittent Supervision/Assistance  Patient can return home with the following  Two people to help with walking and/or transfers;Two people to help with bathing/dressing/bathroom;Assistance with cooking/housework;Assist for transportation;Help with stairs or ramp for entrance    Equipment Recommendations Other (comment) (TBA next venue)  Recommendations for Other Services       Functional Status Assessment Patient has had a recent decline in their functional status and demonstrates the ability to make significant improvements in function in a reasonable and predictable amount of time.     Precautions / Restrictions  Precautions: No hip precautions Precautions: Fall Restrictions Weight Bearing Restrictions: WBAT right LE     Mobility  Bed Mobility Overal bed mobility: Needs Assistance Bed Mobility: Supine to Sit     Supine to sit: Total assist, +2 for physical assistance, HOB elevated     General bed mobility comments: Pt didnt help much at all with coming to EOB.    Transfers Overall transfer level: Needs assistance Equipment used: Rolling walker (2 wheels) Transfers: Sit to/from Stand, Bed to chair/wheelchair/BSC Sit to Stand: Max assist, +2 physical assistance, From elevated surface   Step pivot transfers: Mod assist, +2 physical assistance       General transfer comment: Pt needed max assist of 2 to power up with cues for hand placeent with pt with posterior lean. Pt maintained slight posterior lean even when taking pivotal steps to chair with urse of RW with incr assist with weight shifting and cues to stand tall as he would flex at trunk and knees and need more assist. Pt was able to move left LE easier than right LE.    Ambulation/Gait                  Stairs            Wheelchair Mobility    Modified Rankin (Stroke Patients Only)       Balance Overall balance assessment: Needs assistance Sitting-balance support: Bilateral upper extremity supported, Feet supported Sitting balance-Leahy Scale: Poor Sitting balance - Comments: posterior lean needing assist to sit EOB Postural control: Posterior lean Standing balance support: Bilateral upper extremity supported, During functional activity Standing balance-Leahy Scale: Poor Standing balance comment: posteerior lean, RW and external support  Pertinent Vitals/Pain Pain Assessment Pain Assessment: Faces Faces Pain Scale: Hurts whole lot Pain Location: right hip Pain Descriptors / Indicators: Grimacing, Guarding, Discomfort, Aching Pain Intervention(s): Limited activity  within patient's tolerance, Monitored during session, Repositioned, Premedicated before session    Home Living Family/patient expects to be discharged to:: Private residence Living Arrangements: Alone Available Help at Discharge: Available PRN/intermittently Type of Home: Independent living facility ([facility]) Home Access: Level entry;Elevator       Home Layout: One level Home Equipment: Grab bars - tub/shower;Rollator (4 wheels);Adaptive equipment Additional Comments: Housekeeping/ lines up pills  caretaker 2 days/week and she takes him to appts    Prior Function Prior Level of Function : Independent/Modified Independent;Driving             Mobility Comments: Walked with rollator in facility       Hand Dominance   Dominant Hand: Right    Extremity/Trunk Assessment   Upper Extremity Assessment Upper Extremity Assessment: Defer to OT evaluation    Lower Extremity Assessment Lower Extremity Assessment: RLE deficits/detail RLE: Unable to fully assess due to pain    Cervical / Trunk Assessment Cervical / Trunk Assessment: Normal  Communication   Communication: No difficulties  Cognition Arousal/Alertness: Lethargic Behavior During Therapy: Flat affect Overall Cognitive Status: Within Functional Limits for tasks assessed                                          General Comments General comments (skin integrity, edema, etc.): 100-121 bpm, 94% RA, 103/67 was intiial BP.  sitting 94/68.  After in chair 92/76.    Exercises Total Joint Exercises Ankle Circles/Pumps: AROM, Both, 5 reps, Supine Quad Sets: AROM, Both, 5 reps, Supine Heel Slides: AAROM, Both, 5 reps, Supine Long Arc Quad: AAROM, Both, 5 reps, Seated   Assessment/Plan    PT Assessment Patient needs continued PT services  PT Problem List Decreased activity tolerance;Decreased balance;Decreased mobility;Decreased knowledge of use of DME;Decreased safety awareness;Decreased knowledge  of precautions;Pain;Decreased strength;Decreased range of motion       PT Treatment Interventions DME instruction;Gait training;Functional mobility training;Therapeutic activities;Therapeutic exercise;Balance training;Patient/family education    PT Goals (Current goals can be found in the Care Plan section)  Acute Rehab PT Goals Patient Stated Goal: to go home PT Goal Formulation: With patient Time For Goal Achievement: 02/20/23 Potential to Achieve Goals: Good    Frequency Min 6X/week     Co-evaluation               AM-PAC PT "6 Clicks" Mobility  Outcome Measure Help needed turning from your back to your side while in a flat bed without using bedrails?: Total Help needed moving from lying on your back to sitting on the side of a flat bed without using bedrails?: Total Help needed moving to and from a bed to a chair (including a wheelchair)?: Total Help needed standing up from a chair using your arms (e.g., wheelchair or bedside chair)?: Total Help needed to walk in hospital room?: Total Help needed climbing 3-5 steps with a railing? : Total 6 Click Score: 6    End of Session Equipment Utilized During Treatment: Gait belt Activity Tolerance: Patient limited by fatigue;Patient limited by pain Patient left: in chair;with call bell/phone within reach;with chair alarm set;with family/visitor present Nurse Communication: Mobility status;Need for lift equipment (use stedy) PT Visit Diagnosis: Unsteadiness on feet (R26.81);Muscle weakness (generalized) (M62.81);Pain  Pain - Right/Left: Right Pain - part of body: Leg    Time: 1128-1225 PT Time Calculation (min) (ACUTE ONLY): 57 min   Charges:   PT Evaluation $PT Eval Moderate Complexity: 1 Mod PT Treatments $Therapeutic Exercise: 8-22 mins $Therapeutic Activity: 23-37 mins        [facility] M,PT Acute Rehab Services [phone]   Bevelyn Buckles 02/06/2023, 2:45 PM

## 2023-02-06 NOTE — Plan of Care (Signed)

## 2023-02-06 NOTE — Anesthesia Postprocedure Evaluation (Signed)
Anesthesia Post Note  Patient: [name] [name]  Procedure(s) Performed: ANTERIOR APPROACH HEMI HIP ARTHROPLASTY (Right)     Patient location during evaluation: PACU Anesthesia Type: General Level of consciousness: awake and patient cooperative Pain management: pain level controlled Vital Signs Assessment: post-procedure vital signs reviewed and stable Respiratory status: spontaneous breathing, nonlabored ventilation, respiratory function stable and patient connected to nasal cannula oxygen Cardiovascular status: stable Postop Assessment: no apparent nausea or vomiting Anesthetic complications: no Comments: Dilt gtt for afib rvr   No notable events documented.  Last Vitals:  Vitals:   02/06/23 0752 02/06/23 1110  BP:  102/66  Pulse: 91 86  Resp: (!) 23 (!) 24  Temp:  36.7 C  SpO2: 94% 94%    Last Pain:  Vitals:   02/06/23 1132  TempSrc:   PainSc: 2                  Lunah Losasso

## 2023-02-06 NOTE — Progress Notes (Signed)
PROGRESS NOTE    [name] [name]  ZOX:[id number] DOB: 1941-07-09 DOA: 02/03/2023 PCP: Lorenda Ishihara, MD   Brief Narrative:  The patient is an 82 year old elderly Caucasian male with a past medical history significant for but limited to prostate cancer, history of pancreatic cancer, paroxysmal atrial fibrillation on anticoagulation with Eliquis, Parkinson disease, sleep apnea as well as other comorbidities including BPH who presented to the hospital with a traumatic right hip fracture.  Patient was seen in the usual state of health yesterday morning while he was carrying a tray when he suddenly slipped on his wooden floor with his socks and fell on his right hip.  He denies any loss of consciousness and did not hit his head.  Given his fall and right hip pain EMS was called and he was brought to the emergency department and x-ray was done which confirmed a displaced closed right femoral neck fracture.  Orthopedic surgery was consulted and hospitalist admit this patient and surgical.  Being planned but since patient has been on anticoagulation will need to have it washed out.  Timing of surgery has been deferred to orthopedic surgery and they will do it once operative time is available but it appears that he is rescheduled for 145 this afternoon.   Assessment and Plan:   Displaced Fracture of Right Femoral Neck (HCC) -Inpatient admission to Medical Telemetry -Patient had a mechanical fall and slipped on his wooden floor with his socks while he was carrying a tray -DG Hip done and showed "Acute displaced fracture is seen in the neck of proximal right femur." -Pain control with Acetaminophen 650 mg po/RC q6hprn Mild Pain or Fever >/= 101, Oxycodone IR 2.5-5 mg po q6hprn Severe Pain, and IV Hydromorphone 0.5 mg q4hprn Severe Pain -Bowel Regimen with po Docusate 100 mg po BID and Miralax 17 grams po Dailyprn -VTE prophylaxis with enoxaparin 40 mg subcu every 24 for now -Orthopedic surgery  consulted and formal evaluation is pending but patient will need a right hip arthroplasty and due to him being on Eliquis and OR availability, timing deferred to the orthopedic surgeon and per Dr. Luiz Blare they will proceed with a hemiarthroplasty once operative time is available; per report surgery is scheduled for 1:45 this afternoon   Hx of Pancreatic Cancer (HCC)  -On surveillance after completing chemo 05/2022 -Plan for repeat CT scan with Dr. Mosetta Putt in July 2024. -C/w Lipase/Protease/Amylase Capsules 72,000 Units po TID    Prostate Cancer  -On Eligard q6 months   Paroxysmal A-fib (HCC) -Currently in normal sinus rhythm -Continue Telemetry Monitoring -Continue to Hold Apixaban at this time in anticipation for Surgery -C/w Diltiazem 60 mg po BID -Continue with metoprolol tartrate 5 mg IV every 6 as needed for high blood pressure   Parkinson's Disease  -C/w Carbidopa-Levodopa 50-200 mg 1 tab po qHS as well as Carbidopa-Levodopa 25-100 mg 3 Tab BID at 0700 and Carbidopa-Levodopa 25-100 mg 2 Tab BID at 1900 -Continue with Clonazepam 0.5 mg p.o. nightly   GERD/GI Prophylaxis -C/w Pantoprazole 40 mg po Daily   HLD -C/w Rosuvastatin 10 mg po Daily   Hyperbilirubinemia -Likely Reactive. T Bili Trend: Recent Labs  Lab 01/15/23 1304 02/05/23 0317  BILITOT 1.0 1.3*  -Continue to Monitor and Trend and repeat CMP in the AM  Normocytic Anemia -Hgb/Hct Trend: Recent Labs  Lab 01/15/23 1304 01/18/23 0953 02/03/23 1519 02/04/23 0135 02/05/23 0317  HGB 12.8* 14.8 12.5* 12.0* 12.5*  HCT 38.6* 46.3 39.8 37.2* 38.8*  MCV 92.6 93.5  94.3 91.4 91.1  -Check Anemia Panel in the AM  -Expect Post-operative Drop -Continue to Montior for S/Sx of Bleeding; No overt bleeding noted -Repeat CBC in the AM  Hypoalbuminemia -Patient's Albumin Trend: Recent Labs  Lab 01/15/23 1304 02/05/23 0317  ALBUMIN 4.0 3.2*  -Continue to Monitor and Trend and repeat CMP in the AM   DVT prophylaxis: SCDs  Start: 02/05/23 1725 SCDs Start: 02/03/23 1643 apixaban (ELIQUIS) tablet 5 mg    Code Status: Full Code Family Communication: Discussed with family currently at bedside  Disposition Plan:  Level of care: Progressive Status is: Inpatient Remains inpatient appropriate because: His further clinical improvement and clearance by the orthopedic team and PT OT recommending SNF   Consultants:  Orthopedic surgery  Procedures:  PROCEDURE:  1.  Right bipolar Hemi arthroplasty using a , #7 Actis stem, 28 mm internal ball with 1.5 neck length, 58 mm hemiarthroplasty metal ball. 2.interpretation of multiple intraoperative fluoroscopic images  Antimicrobials:  Anti-infectives (From admission, onward)    Start     Dose/Rate Route Frequency Ordered Stop   02/05/23 2000  ceFAZolin (ANCEF) IVPB 2g/100 mL premix        2 g 200 mL/hr over 30 Minutes Intravenous Every 6 hours 02/05/23 1724 02/06/23 1114   02/05/23 0630  ceFAZolin (ANCEF) IVPB 2g/100 mL premix        2 g 200 mL/hr over 30 Minutes Intravenous On call to O.R. 02/05/23 0530 02/06/23 1049       Subjective: Seen and examined at bedside and states that he had a rough night last night and states he did not sleep very well.  States that he rested about 2 hours.  States that his heart rate started racing yesterday after surgery.  No nausea or vomiting.  Feels tired and fatigued.  No other concerns or complaints at this time.  Objective: Vitals:   02/06/23 0739 02/06/23 0752 02/06/23 1110 02/06/23 1409  BP: 107/69  102/66 105/66  Pulse: (!) 150 91 86 71  Resp: (!) 28 (!) 23 (!) 24 20  Temp: 98 F (36.7 C)  98.1 F (36.7 C) 98.6 F (37 C)  TempSrc: Oral  Oral Oral  SpO2: 93% 94% 94% 95%  Weight:      Height:        Intake/Output Summary (Last 24 hours) at 02/06/2023 1557 Last data filed at 02/06/2023 1535 Gross per 24 hour  Intake 3191.86 ml  Output 2375 ml  Net 816.86 ml   Filed Weights   02/05/23 1309  Weight: 68.9 kg    Examination: Physical Exam:  Constitutional: Thin elderly slightly overweight Caucasian male who appears fatigued Respiratory: Diminished to auscultation bilaterally, no wheezing, rales, rhonchi or crackles. Normal respiratory effort and patient is not tachypenic. No accessory muscle use.  Unlabored breathing Cardiovascular: Irregularly irregular but not tachycardic, no murmurs / rubs / gallops. S1 and S2 auscultated. No extremity edema. Abdomen: Soft, non-tender, non-distended. Bowel sounds positive.  GU: Deferred. Musculoskeletal: No clubbing / cyanosis of digits/nails. No joint deformity upper and lower extremities.  Skin: No rashes, lesions, ulcers on limited skin evaluation Neurologic: CN 2-12 grossly intact with no focal deficits. Romberg sign and cerebellar reflexes not assessed.  Psychiatric: Normal judgment and insight. Alert and oriented x 3. Normal mood and appropriate affect.   Data Reviewed: I have personally reviewed following labs and imaging studies  CBC: Recent Labs  Lab 02/03/23 1519 02/04/23 0135 02/05/23 0317 02/06/23 0145  WBC 5.8 7.7 6.8 7.1  NEUTROABS 4.0  --  4.6 5.7  HGB 12.5* 12.0* 12.5* 11.7*  HCT 39.8 37.2* 38.8* 35.4*  MCV 94.3 91.4 91.1 91.7  PLT 180 187 168 172   Basic Metabolic Panel: Recent Labs  Lab 02/03/23 1519 02/04/23 0135 02/05/23 0317 02/06/23 0145  NA 137 135 137 132*  K 3.8 4.0 4.1 3.9  CL 104 102 103 102  CO2 24 24 23  21*  GLUCOSE 114* 140* 114* 148*  BUN 17 16 18 13   CREATININE 1.11 1.11 1.12 1.00  CALCIUM 8.9 9.1 8.8* 8.3*  MG  --   --  2.1 1.8  PHOS  --   --  2.6 2.7   GFR: Estimated Creatinine Clearance: 50.4 mL/min (by C-G formula based on SCr of 1 mg/dL). Liver Function Tests: Recent Labs  Lab 02/05/23 0317 02/06/23 0145  AST 22 27  ALT 6 6  ALKPHOS 47 38  BILITOT 1.3* 1.5*  PROT 6.1* 5.8*  ALBUMIN 3.2* 3.1*   No results for input(s): "LIPASE", "AMYLASE" in the last 168 hours. No results for input(s):  "AMMONIA" in the last 168 hours. Coagulation Profile: Recent Labs  Lab 02/03/23 1519  INR 1.2   Cardiac Enzymes: No results for input(s): "CKTOTAL", "CKMB", "CKMBINDEX", "TROPONINI" in the last 168 hours. BNP (last 3 results) No results for input(s): "PROBNP" in the last 8760 hours. HbA1C: No results for input(s): "HGBA1C" in the last 72 hours. CBG: No results for input(s): "GLUCAP" in the last 168 hours. Lipid Profile: No results for input(s): "CHOL", "HDL", "LDLCALC", "TRIG", "CHOLHDL", "LDLDIRECT" in the last 72 hours. Thyroid Function Tests: No results for input(s): "TSH", "T4TOTAL", "FREET4", "T3FREE", "THYROIDAB" in the last 72 hours. Anemia Panel: Recent Labs    02/06/23 0145  VITAMINB12 846  FOLATE 27.2  FERRITIN 138  TIBC 265  IRON 15*  RETICCTPCT 1.3   Sepsis Labs: No results for input(s): "PROCALCITON", "LATICACIDVEN" in the last 168 hours.  Recent Results (from the past 240 hour(s))  MRSA Next Gen by PCR, Nasal     Status: None   Collection Time: 02/04/23  5:57 AM   Specimen: Nasal Mucosa; Nasal Swab  Result Value Ref Range Status   MRSA by PCR Next Gen NOT DETECTED NOT DETECTED Final    Comment: (NOTE) The GeneXpert MRSA Assay (FDA approved for NASAL specimens only), is one component of a comprehensive MRSA colonization surveillance program. It is not intended to diagnose MRSA infection nor to guide or monitor treatment for MRSA infections. Test performance is not FDA approved in patients less than 67 years old. Performed at [facility], [address], [place], [place] [address]      Radiology Studies: DG HIP UNILAT WITH PELVIS 1V RIGHT  Result Date: 02/05/2023 CLINICAL DATA:  Elective surgery. EXAM: DG HIP (WITH OR WITHOUT PELVIS) 1V RIGHT COMPARISON:  None Available. FINDINGS: Two fluoroscopic spot views of the pelvis and right hip obtained in the operating room. Images during hip arthroplasty. Fluoroscopy time 10 seconds. Dose 1.0 mGy.  IMPRESSION: Fluoroscopic spot views during right hip arthroplasty. Electronically Signed   By: Narda Rutherford M.D.   On: 02/05/2023 18:31   DG C-Arm 1-60 Min-No Report  Result Date: 02/05/2023 Fluoroscopy was utilized by the requesting physician.  No radiographic interpretation.   DG Chest 2 View  Result Date: 02/05/2023 CLINICAL DATA:  Preop chest exam. EXAM: CHEST - 2 VIEW COMPARISON:  Chest x-ray Feb 03, 2023. FINDINGS: The heart size and mediastinal contours are unchanged. Both  lungs are clear. No visible pleural effusions or pneumothorax. No acute osseous abnormality. Prior thoracic kyphoplasty for compression fracture with similar height loss. IMPRESSION: No active cardiopulmonary disease. Electronically Signed   By: Feliberto Harts M.D.   On: 02/05/2023 08:55    Scheduled Meds:  apixaban  5 mg Oral BID   carbidopa-levodopa  1 tablet Oral QHS   carbidopa-levodopa  3 tablet Oral BID   And   carbidopa-levodopa  2 tablet Oral BID   Chlorhexidine Gluconate Cloth  6 each Topical Daily   clonazePAM  0.5 mg Oral QHS   cyanocobalamin  1,000 mcg Oral Daily   diltiazem  60 mg Oral BID   docusate sodium  100 mg Oral BID   lipase/protease/amylase  72,000 Units Oral TID with meals   pantoprazole  40 mg Oral Daily   polyethylene glycol  17 g Oral Q breakfast   rosuvastatin  10 mg Oral Daily   Continuous Infusions:  sodium chloride 10 mL/hr at 02/05/23 2051   dextrose 5 % and 0.45 % NaCl 75 mL/hr at 02/06/23 1535    LOS: 3 days   Marguerita Merles, DO [facility] Available via Epic secure chat 7am-7pm After these hours, please refer to coverage provider listed on [email or web address] 02/06/2023, 3:57 PM

## 2023-02-07 DIAGNOSIS — C259 Malignant neoplasm of pancreas, unspecified: Secondary | ICD-10-CM | POA: Diagnosis not present

## 2023-02-07 DIAGNOSIS — I480 Paroxysmal atrial fibrillation: Secondary | ICD-10-CM | POA: Diagnosis not present

## 2023-02-07 DIAGNOSIS — S72001A Fracture of unspecified part of neck of right femur, initial encounter for closed fracture: Secondary | ICD-10-CM | POA: Diagnosis not present

## 2023-02-07 DIAGNOSIS — G20A1 Parkinson's disease without dyskinesia, without mention of fluctuations: Secondary | ICD-10-CM | POA: Diagnosis not present

## 2023-02-07 LAB — CBC WITH DIFFERENTIAL/PLATELET
Abs Immature Granulocytes: 0.05 10*3/uL (ref 0.00–0.07)
Basophils Absolute: 0 10*3/uL (ref 0.0–0.1)
Basophils Relative: 0 %
Eosinophils Absolute: 0 10*3/uL (ref 0.0–0.5)
Eosinophils Relative: 0 %
HCT: 33.8 % — ABNORMAL LOW (ref 39.0–52.0)
Hemoglobin: 11.3 g/dL — ABNORMAL LOW (ref 13.0–17.0)
Immature Granulocytes: 1 %
Lymphocytes Relative: 8 %
Lymphs Abs: 0.9 10*3/uL (ref 0.7–4.0)
MCH: 30.5 pg (ref 26.0–34.0)
MCHC: 33.4 g/dL (ref 30.0–36.0)
MCV: 91.4 fL (ref 80.0–100.0)
Monocytes Absolute: 1.4 10*3/uL — ABNORMAL HIGH (ref 0.1–1.0)
Monocytes Relative: 13 %
Neutro Abs: 8.2 10*3/uL — ABNORMAL HIGH (ref 1.7–7.7)
Neutrophils Relative %: 78 %
Platelets: 178 10*3/uL (ref 150–400)
RBC: 3.7 MIL/uL — ABNORMAL LOW (ref 4.22–5.81)
RDW: 13.3 % (ref 11.5–15.5)
WBC: 10.5 10*3/uL (ref 4.0–10.5)
nRBC: 0 % (ref 0.0–0.2)

## 2023-02-07 LAB — COMPREHENSIVE METABOLIC PANEL
ALT: 6 U/L (ref 0–44)
AST: 32 U/L (ref 15–41)
Albumin: 2.7 g/dL — ABNORMAL LOW (ref 3.5–5.0)
Alkaline Phosphatase: 42 U/L (ref 38–126)
Anion gap: 9 (ref 5–15)
BUN: 18 mg/dL (ref 8–23)
CO2: 20 mmol/L — ABNORMAL LOW (ref 22–32)
Calcium: 8.3 mg/dL — ABNORMAL LOW (ref 8.9–10.3)
Chloride: 100 mmol/L (ref 98–111)
Creatinine, Ser: 1.17 mg/dL (ref 0.61–1.24)
GFR, Estimated: >60 mL/min (ref >60)
Glucose, Bld: 123 mg/dL — ABNORMAL HIGH (ref 70–99)
Potassium: 4.4 mmol/L (ref 3.5–5.1)
Sodium: 129 mmol/L — ABNORMAL LOW (ref 135–145)
Total Bilirubin: 1.5 mg/dL — ABNORMAL HIGH (ref 0.3–1.2)
Total Protein: 5.8 g/dL — ABNORMAL LOW (ref 6.5–8.1)

## 2023-02-07 LAB — PHOSPHORUS: Phosphorus: 2.4 mg/dL — ABNORMAL LOW (ref 2.5–4.6)

## 2023-02-07 LAB — MAGNESIUM: Magnesium: 2.2 mg/dL (ref 1.7–2.4)

## 2023-02-07 MED ORDER — SODIUM PHOSPHATES 45 MMOLE/15ML IV SOLN
15.0000 mmol | Freq: Once | INTRAVENOUS | Status: AC
  Administered 2023-02-07: 15 mmol via INTRAVENOUS
  Filled 2023-02-07: qty 5

## 2023-02-07 NOTE — Plan of Care (Signed)

## 2023-02-07 NOTE — Evaluation (Signed)
Occupational Therapy Evaluation Patient Details Name: [name] [name] MRN: [id number] DOB: 1940-10-05 Today's Date: 02/07/2023   History of Present Illness [name] is a 82 y.o. male admitted 5/31 after fall. Sustained right femoral neck fracture. Underwent right hemiarthroplasty on 5/31.  AOZ:HYQMVHQI cancer, pancreatic cancer, paroxysmal atrial fibrillation on Eliquis, Parkinsons   Clinical Impression   Pt reports independence at baseline with ADLs and functional mobility, lives alone in [facility]. Pt currently needing min-total A for ADLs, and max A +2 for bed mobility, HR ranging 130-150bpm but got up to 178bpm at highest sitting EOB, RN notified. BP 103/70 (79) pre-session, 129/78 (92) post-session. Pt presenting with impairments listed below, will follow acutely. Patient will benefit from continued inpatient follow up therapy, <3 hours/day to maximize safety and independence with ADLs/functional mobility.        Recommendations for follow up therapy are one component of a multi-disciplinary discharge planning process, led by the attending physician.  Recommendations may be updated based on patient status, additional functional criteria and insurance authorization.   Assistance Recommended at Discharge Frequent or constant Supervision/Assistance  Patient can return home with the following Two people to help with walking and/or transfers;A lot of help with bathing/dressing/bathroom;Assistance with cooking/housework;Direct supervision/assist for medications management;Direct supervision/assist for financial management;Assist for transportation;Help with stairs or ramp for entrance    Functional Status Assessment  Patient has had a recent decline in their functional status and demonstrates the ability to make significant improvements in function in a reasonable and predictable amount of time.  Equipment Recommendations  Other (comment) (defer)    Recommendations for Other  Services PT consult     Precautions / Restrictions Precautions Precautions: Fall Precaution Comments: No hip precautions per MD Restrictions Weight Bearing Restrictions: Yes RLE Weight Bearing: Weight bearing as tolerated      Mobility Bed Mobility Overal bed mobility: Needs Assistance Bed Mobility: Supine to Sit     Supine to sit: Max assist, +2 for physical assistance     General bed mobility comments: HR up to 178bpm sitting EOB, RN notified    Transfers                   General transfer comment: deferred due to elevated HR      Balance Overall balance assessment: Needs assistance Sitting-balance support: Bilateral upper extremity supported, Feet supported Sitting balance-Leahy Scale: Fair Sitting balance - Comments: strong posterior lean                                   ADL either performed or assessed with clinical judgement   ADL Overall ADL's : Needs assistance/impaired Eating/Feeding: Minimal assistance   Grooming: Minimal assistance   Upper Body Bathing: Moderate assistance   Lower Body Bathing: Maximal assistance   Upper Body Dressing : Moderate assistance   Lower Body Dressing: Total assistance   Toilet Transfer: Maximal assistance;+2 for physical assistance   Toileting- Clothing Manipulation and Hygiene: Moderate assistance       Functional mobility during ADLs: Maximal assistance;+2 for physical assistance       Vision Baseline Vision/History: 1 Wears glasses Vision Assessment?: No apparent visual deficits     Perception Perception Perception Tested?: No   Praxis Praxis Praxis tested?: Not tested    Pertinent Vitals/Pain Pain Assessment Pain Assessment: Faces Pain Score: 7  Faces Pain Scale: Hurts whole lot Pain Location: right hip Pain Descriptors / Indicators:  Grimacing, Guarding, Discomfort, Aching Pain Intervention(s): Limited activity within patient's tolerance, Monitored during session,  Repositioned     Hand Dominance Right   Extremity/Trunk Assessment Upper Extremity Assessment Upper Extremity Assessment: Generalized weakness   Lower Extremity Assessment Lower Extremity Assessment: Defer to PT evaluation   Cervical / Trunk Assessment Cervical / Trunk Assessment: Normal   Communication Communication Communication: No difficulties   Cognition Arousal/Alertness: Lethargic Behavior During Therapy: Flat affect Overall Cognitive Status: Within Functional Limits for tasks assessed                                       General Comments  son in room, HR elevated to 178 bpm at highest during session, frequently ranging 130-150bpm, RN aware    Exercises     Shoulder Instructions      Home Living Family/patient expects to be discharged to:: Private residence Living Arrangements: Alone Available Help at Discharge: Available PRN/intermittently Type of Home: Independent living facility ([facility]) Home Access: Level entry;Elevator     Home Layout: One level     Bathroom Shower/Tub: Producer, television/film/video: Standard     Home Equipment: Grab bars - tub/shower;Rollator (4 wheels);Adaptive equipment Adaptive Equipment: Reacher Additional Comments: Housekeeping/ lines up pills  caretaker 2 days/week and she takes him to appts      Prior Functioning/Environment Prior Level of Function : Independent/Modified Independent;Driving             Mobility Comments: Walked with rollator in facility ADLs Comments: ind with ADLs        OT Problem List: Decreased strength;Decreased range of motion;Decreased activity tolerance;Impaired balance (sitting and/or standing);Cardiopulmonary status limiting activity      OT Treatment/Interventions: Therapeutic exercise;Self-care/ADL training;DME and/or AE instruction;Energy conservation;Therapeutic activities;Patient/family education;Balance training    OT Goals(Current goals can be found in  the care plan section) Acute Rehab OT Goals Patient Stated Goal: none stated OT Goal Formulation: With patient Time For Goal Achievement: 02/21/23 Potential to Achieve Goals: Good  OT Frequency: Min 1X/week    Co-evaluation PT/OT/SLP Co-Evaluation/Treatment: Yes Reason for Co-Treatment: For patient/therapist safety;To address functional/ADL transfers;Complexity of the patient's impairments (multi-system involvement)   OT goals addressed during session: ADL's and self-care      AM-PAC OT "6 Clicks" Daily Activity     Outcome Measure Help from another person eating meals?: A Little Help from another person taking care of personal grooming?: A Little Help from another person toileting, which includes using toliet, bedpan, or urinal?: A Lot Help from another person bathing (including washing, rinsing, drying)?: A Lot Help from another person to put on and taking off regular upper body clothing?: A Lot Help from another person to put on and taking off regular lower body clothing?: Total 6 Click Score: 13   End of Session Equipment Utilized During Treatment: Rolling walker (2 wheels) Nurse Communication: Mobility status  Activity Tolerance: Patient tolerated treatment well Patient left: in bed;with call bell/phone within reach;with bed alarm set;with family/visitor present  OT Visit Diagnosis: Unsteadiness on feet (R26.81);Other abnormalities of gait and mobility (R26.89);Muscle weakness (generalized) (M62.81)                Time: 4098-1191 OT Time Calculation (min): 24 min Charges:  OT General Charges $OT Visit: 1 Visit OT Evaluation $OT Eval Moderate Complexity: 1 Mod  Maanasa Aderhold K, OTD, OTR/L SecureChat Preferred Acute Rehab [phone]  Dalphine Handing 02/07/2023, 10:39 AM

## 2023-02-07 NOTE — Progress Notes (Signed)
PATIENT ID: [name] [name]  MRN: [id number]  DOB/AGE:  03/23/1941 / 82 y.o.  2 Days Post-Op Procedure(s) (LRB): ANTERIOR APPROACH HEMI HIP ARTHROPLASTY (Right)    PROGRESS NOTE Subjective: Patient is alert, oriented, no Nausea, no Vomiting, yes passing gas, . Taking PO well. Denies SOB, Chest or Calf Pain. Using Incentive Spirometer, PAS in place. Ambulate WBAT with use of walker.  Therapist must be present as pt does have parkinsons Patient reports pain as  mild at rest .    Objective: Vital signs in last 24 hours: Vitals:   02/06/23 2145 02/06/23 2341 02/07/23 0334 02/07/23 0801  BP: (!) 98/57 92/75 94/64  (!) 116/57  Pulse:  87 95 99  Resp:  20 20 (!) 21  Temp:  97.7 F (36.5 C) 97.6 F (36.4 C) 97.9 F (36.6 C)  TempSrc:  Oral Oral Oral  SpO2:  96% 97% 95%  Weight:      Height:          Intake/Output from previous day: I/O last 3 completed shifts: In: 1989.3 [P.O.:360; I.V.:1579.3; IV Piggyback:50] Out: 1900 [Urine:1900]   Intake/Output this shift: No intake/output data recorded.   LABORATORY DATA: Recent Labs    02/05/23 0317 02/06/23 0145  WBC 6.8 7.1  HGB 12.5* 11.7*  HCT 38.8* 35.4*  PLT 168 172  NA 137 132*  K 4.1 3.9  CL 103 102  CO2 23 21*  BUN 18 13  CREATININE 1.12 1.00  GLUCOSE 114* 148*  CALCIUM 8.8* 8.3*    Examination: Neurologically intact Neurovascular intact Sensation intact distally Intact pulses distally Dorsiflexion/Plantar flexion intact Incision: scant drainage No cellulitis present Compartment soft} XR AP&Lat of hip shows well placed\fixed THA  Assessment:   2 Days Post-Op Procedure(s) (LRB): ANTERIOR APPROACH HEMI HIP ARTHROPLASTY (Right) ADDITIONAL DIAGNOSIS:  Expected Acute Blood Loss Anemia, Cardiac Arrythmia AFIB and Parkinsons Anticipated LOS equal to or greater than 2 midnights due to - Age 44 and older with one or more of the following:  - Obesity  - Expected need for hospital services (PT, OT, Nursing)  required for safe  discharge  - Anticipated need for postoperative skilled nursing care or inpatient rehab    Plan: PT/OT WBAT, THA must use walker and therapist must be present  DVT Prophylaxis: SCDx72 hrs, Eliquis 5 mg BID  DISCHARGE PLAN: Skilled Nursing Facility/Rehab  DISCHARGE NEEDS: HHPT, Walker, and 3-in-1 comode seat

## 2023-02-07 NOTE — Progress Notes (Addendum)
PROGRESS NOTE    [name] [name]  ZOX:[id number] DOB: [birth year] DOA: 02/03/2023 PCP: Lorenda Ishihara, MD   Brief Narrative:  The patient is an 82 year old elderly Caucasian male with a past medical history significant for but limited to prostate cancer, history of pancreatic cancer, paroxysmal atrial fibrillation on anticoagulation with Eliquis, Parkinson disease, sleep apnea as well as other comorbidities including BPH who presented to the hospital with a traumatic right hip fracture.  Patient was seen in the usual state of health yesterday morning while he was carrying a tray when he suddenly slipped on his wooden floor with his socks and fell on his right hip.  He denies any loss of consciousness and did not hit his head.  Given his fall and right hip pain EMS was called and he was brought to the emergency department and x-ray was done which confirmed a displaced closed right femoral neck fracture.    Orthopedic Surgery was consulted and hospitalist admitted this patient. Surgery was done on 02/05/2023.  Postoperatively the patient went into atrial fibrillation with RVR had to be placed on a Cardizem drip to be transferred to the cardiac floor.  Intermittently tachycardic so getting IV metoprolol as well.  PT evaluated and recommending SNF and now his anticoagulation has been resumed.  Assessment and Plan:   Displaced Fracture of Right Femoral Neck (HCC) status post right bipolar hemiarthroplasty POD2 -Inpatient admission to Medical Telemetry -Patient had a mechanical fall and slipped on his wooden floor with his socks while he was carrying a tray -DG Hip done and showed "Acute displaced fracture is seen in the neck of proximal right femur." -Pain control with Acetaminophen 650 mg po/RC q6hprn Mild Pain or Fever >/= 101, Oxycodone IR 2.5-5 mg po q6hprn Severe Pain, and IV Hydromorphone 0.5 mg q4hprn Severe Pain -She had received Tranexamic Acid 1000 mg  -Bowel Regimen with po Docusate  100 mg po BID and Miralax 17 grams po Dailyprn -VTE prophylaxis with enoxaparin 40 mg subcu every 24 for now and this has not been transitioned back to his home anticoagulant with apixaban 5 mg p.o. twice daily -IVF now discontinued  -Patient had surgical intervention on 02/05/2023 -PT OT evaluated and recommending SNF and Ortho recommends weightbearing as tolerated THA soft use walker and therapist must be present -They are recommending SCDs for 72 hours and continuing Eliquis twice daily   Hx of Pancreatic Cancer (HCC)  -On surveillance after completing chemo 05/2022 -Plan for repeat CT scan with Dr. Mosetta Putt in July 2024. -C/w Lipase/Protease/Amylase Capsules 72,000 Units po TID    Prostate Cancer  -On Eligard q6 months   Paroxysmal A-fib (HCC) with RVR  -Initially he was in in normal sinus rhythm but postoperatively he went to A-fib with RVR and continued to have uncontrolled rates today -Continue Telemetry Monitoring -Continue to Hold Apixaban at this time in anticipation for Surgery; resume anticoagulation once okay with orthopedic surgery -C/w Diltiazem 60 mg po BID and had to be placed on a Cardizem drip which was stopped but hesitant to restart given soft BP -Continue with Metoprolol tartrate 5 mg IV every 6 as needed for high blood pressure also added heart rate parameters for heart rate greater than 130 sustained -Had to be placed on a Cardizem drip but now this has been discontinued -Has been in A-fib but not RVR and consulted Cardiology for further evaluation and they feel that the patient's pain is contributing to his RVR and recommending IV metoprolol 5 mg IV as  needed for sustained heart rate and they are considering adding Corlanor as it would not affect his blood pressure   Parkinson's Disease  -C/w Carbidopa-Levodopa 50-200 mg 1 tab po qHS as well as Carbidopa-Levodopa 25-100 mg 3 Tab BID at 0700 and Carbidopa-Levodopa 25-100 mg 2 Tab BID at 1900 -Continue with Clonazepam 0.5  mg p.o. nightly -PT evaluated and recommending SNF  Hyponatremia -Mild. Na+ Trend: Recent Labs  Lab 01/15/23 1304 01/18/23 0953 02/03/23 1519 02/04/23 0135 02/05/23 0317 02/06/23 0145 02/07/23 1124  NA 141 141 137 135 137 132* 129*  -Stopping IVF as above and replete with IV Sodium Phos 15 mmol -Continue to monitor and trend and repeat CMP in a.m.  Hypophosphatemia -Phos Level Trend: Recent Labs  Lab 02/05/23 0317 02/06/23 0145 02/07/23 1124  PHOS 2.6 2.7 2.4*  -Replete with IV Sodium Phos 15 mmol -Continue to Monitor and replete as necessary  Metabolic Acidosis -Mild.  Patient has a CO2 of 20, anion gap of 9, chloride level 100 -Stopping IVF as above -Continue monitor and trend and repeat CMP in the a.m.   GERD/GI Prophylaxis -C/w Pantoprazole 40 mg po Daily   HLD -C/w Rosuvastatin 10 mg po Daily   Hyperbilirubinemia -Likely Reactive. T Bili Trend: Recent Labs  Lab 01/15/23 1304 02/05/23 0317 02/06/23 0145 02/07/23 1124  BILITOT 1.0 1.3* 1.5* 1.5*  -Continue to Monitor and Trend and repeat CMP in the AM  Normocytic Anemia -Hgb/Hct Trend: Recent Labs  Lab 01/15/23 1304 01/18/23 0953 02/03/23 1519 02/04/23 0135 02/05/23 0317 02/06/23 0145 02/07/23 1124  HGB 12.8* 14.8 12.5* 12.0* 12.5* 11.7* 11.3*  HCT 38.6* 46.3 39.8 37.2* 38.8* 35.4* 33.8*  MCV 92.6 93.5 94.3 91.4 91.1 91.7 91.4  -Checked Anemia Panel and showed an iron level of 15, UIBC of 250, TIBC of 265, saturation ratios of 6%, ferritin level 130, folate level 27.2 and vitamin B12 level 846 -Expected Post-operative Drop -Continue to Montior for S/Sx of Bleeding; No overt bleeding noted -Repeat CBC in the AM  Hypoalbuminemia -Patient's Albumin Trend: Recent Labs  Lab 01/15/23 1304 02/05/23 0317 02/06/23 0145 02/07/23 1124  ALBUMIN 4.0 3.2* 3.1* 2.7*  -Continue to Monitor and Trend and repeat CMP in the AM   DVT prophylaxis: SCDs Start: 02/05/23 1725 SCDs Start: 02/03/23  1643 apixaban (ELIQUIS) tablet 5 mg    Code Status: Full Code Family Communication: Discussed with Son at bedside  Disposition Plan:  Level of care: Progressive Status is: Inpatient Remains inpatient appropriate because: Needs SNF and further clearance orthopedic surgery and cardiology   Consultants:  Orthopedic surgery Cardiology  Procedures:  As delineated as above  Antimicrobials:  Anti-infectives (From admission, onward)    Start     Dose/Rate Route Frequency Ordered Stop   02/05/23 2000  ceFAZolin (ANCEF) IVPB 2g/100 mL premix        2 g 200 mL/hr over 30 Minutes Intravenous Every 6 hours 02/05/23 1724 02/06/23 1114   02/05/23 0630  ceFAZolin (ANCEF) IVPB 2g/100 mL premix        2 g 200 mL/hr over 30 Minutes Intravenous On call to O.R. 02/05/23 0530 02/06/23 1049       Subjective: Seen and examined at bedside and he was in A-fib with RVR and his leg was tremoring and given the amount of pain that he was in.  No nausea or vomiting.  Denies any lightheadedness or dizziness.  Just does not feel well.  No other concerns reported at this time.  Objective: Vitals:  02/07/23 1043 02/07/23 1208 02/07/23 1631 02/07/23 1805  BP: 102/71 102/65 90/67 105/71  Pulse: (!) 111 (!) 109 93 97  Resp: 20 20 (!) 21 17  Temp:  97.6 F (36.4 C) 97.9 F (36.6 C)   TempSrc:  Oral Oral   SpO2: 94% 96% 95% 94%  Weight:      Height:        Intake/Output Summary (Last 24 hours) at 02/07/2023 1906 Last data filed at 02/07/2023 1812 Gross per 24 hour  Intake 145.16 ml  Output 400 ml  Net -254.84 ml   Filed Weights   02/05/23 1309  Weight: 68.9 kg   Examination: Physical Exam:  Constitutional: Thin elderly chronically ill-appearing Caucasian male who appears uncomfortable and does appear to be in quite a bit of pain Respiratory: Diminished to auscultation bilaterally, no wheezing, rales, rhonchi or crackles. Normal respiratory effort and patient is not tachypenic. No accessory muscle  use.  Unlabored breathing Cardiovascular: Irregularly irregular and tachycardic, no murmurs / rubs / gallops. S1 and S2 auscultated.  Has some slight extremity edema Abdomen: Soft, non-tender, non-distended. Bowel sounds positive.  GU: Deferred. Musculoskeletal: No clubbing / cyanosis of digits/nails. No joint deformity upper and lower extremities.  Skin: No rashes, lesions, ulcers limited skin evaluation. No induration; Warm and dry.  Neurologic: CN 2-12 grossly intact with no focal deficits.  Patient's legs are tremoring specifically the right leg Psychiatric: Normal judgment and insight. Alert and oriented x 3.  Anxious mood and appropriate affect.   Data Reviewed: I have personally reviewed following labs and imaging studies  CBC: Recent Labs  Lab 02/03/23 1519 02/04/23 0135 02/05/23 0317 02/06/23 0145 02/07/23 1124  WBC 5.8 7.7 6.8 7.1 10.5  NEUTROABS 4.0  --  4.6 5.7 8.2*  HGB 12.5* 12.0* 12.5* 11.7* 11.3*  HCT 39.8 37.2* 38.8* 35.4* 33.8*  MCV 94.3 91.4 91.1 91.7 91.4  PLT 180 187 168 172 178   Basic Metabolic Panel: Recent Labs  Lab 02/03/23 1519 02/04/23 0135 02/05/23 0317 02/06/23 0145 02/07/23 1124  NA 137 135 137 132* 129*  K 3.8 4.0 4.1 3.9 4.4  CL 104 102 103 102 100  CO2 24 24 23  21* 20*  GLUCOSE 114* 140* 114* 148* 123*  BUN 17 16 18 13 18   CREATININE 1.11 1.11 1.12 1.00 1.17  CALCIUM 8.9 9.1 8.8* 8.3* 8.3*  MG  --   --  2.1 1.8 2.2  PHOS  --   --  2.6 2.7 2.4*   GFR: Estimated Creatinine Clearance: 43.1 mL/min (by C-G formula based on SCr of 1.17 mg/dL). Liver Function Tests: Recent Labs  Lab 02/05/23 0317 02/06/23 0145 02/07/23 1124  AST 22 27 32  ALT 6 6 6   ALKPHOS 47 38 42  BILITOT 1.3* 1.5* 1.5*  PROT 6.1* 5.8* 5.8*  ALBUMIN 3.2* 3.1* 2.7*   No results for input(s): "LIPASE", "AMYLASE" in the last 168 hours. No results for input(s): "AMMONIA" in the last 168 hours. Coagulation Profile: Recent Labs  Lab 02/03/23 1519  INR 1.2    Cardiac Enzymes: No results for input(s): "CKTOTAL", "CKMB", "CKMBINDEX", "TROPONINI" in the last 168 hours. BNP (last 3 results) No results for input(s): "PROBNP" in the last 8760 hours. HbA1C: No results for input(s): "HGBA1C" in the last 72 hours. CBG: No results for input(s): "GLUCAP" in the last 168 hours. Lipid Profile: No results for input(s): "CHOL", "HDL", "LDLCALC", "TRIG", "CHOLHDL", "LDLDIRECT" in the last 72 hours. Thyroid Function Tests: No results for input(s): "  TSH", "T4TOTAL", "FREET4", "T3FREE", "THYROIDAB" in the last 72 hours. Anemia Panel: Recent Labs    02/06/23 0145  VITAMINB12 846  FOLATE 27.2  FERRITIN 138  TIBC 265  IRON 15*  RETICCTPCT 1.3   Sepsis Labs: No results for input(s): "PROCALCITON", "LATICACIDVEN" in the last 168 hours.  Recent Results (from the past 240 hour(s))  MRSA Next Gen by PCR, Nasal     Status: None   Collection Time: 02/04/23  5:57 AM   Specimen: Nasal Mucosa; Nasal Swab  Result Value Ref Range Status   MRSA by PCR Next Gen NOT DETECTED NOT DETECTED Final    Comment: (NOTE) The GeneXpert MRSA Assay (FDA approved for NASAL specimens only), is one component of a comprehensive MRSA colonization surveillance program. It is not intended to diagnose MRSA infection nor to guide or monitor treatment for MRSA infections. Test performance is not FDA approved in patients less than 60 years old. Performed at [facility], [address] [place], [place] [address]     Radiology Studies: No results found.  Scheduled Meds:  apixaban  5 mg Oral BID   carbidopa-levodopa  1 tablet Oral QHS   carbidopa-levodopa  3 tablet Oral BID   And   carbidopa-levodopa  2 tablet Oral BID   Chlorhexidine Gluconate Cloth  6 each Topical Daily   clonazePAM  0.5 mg Oral QHS   cyanocobalamin  1,000 mcg Oral Daily   diltiazem  60 mg Oral BID   docusate sodium  100 mg Oral BID   lipase/protease/amylase  72,000 Units Oral TID with meals    pantoprazole  40 mg Oral Daily   polyethylene glycol  17 g Oral Q breakfast   rosuvastatin  10 mg Oral Daily   Continuous Infusions:  sodium chloride Stopped (02/06/23 0625)   sodium phosphate 15 mmol in dextrose 5 % 250 mL infusion      LOS: 4 days   Marguerita Merles, DO [facility] Available via Epic secure chat 7am-7pm After these hours, please refer to coverage provider listed on [email or web address] 02/07/2023, 7:06 PM

## 2023-02-07 NOTE — Consult Note (Signed)
CARDIOLOGY CONSULT NOTE  Patient ID: [name] [name] MRN: [id number] DOB/AGE: 01-05-1941 82 y.o.  Admit date: 02/03/2023 Referring Physician  Dr Marland Mcalpine Primary Physician:  Lorenda Ishihara, MD Reason for Consultation  Afib RVR  Patient ID: [name] [name], male    DOB: 05/10/1941, 82 y.o.   MRN: [id number]  Chief Complaint  Patient presents with   Fall   HPI:    TRAMON PICKING  is a 82 y.o. 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  Patient follows with Dr Odis Hollingshead for paroxysmal atrial flutter management. In May 2024 patient had an ED visit for palpitations and had to be cardioverted to sinus rhythm by the ED provider.   He presented to the ED this time with an acute hip fracture. Cardiology was placed on consultation due Afib RVR with rates as high as 170s when getting him up and moving. He states he is in excruciating pain right now which I suspect is contributing to his RVR. Denies chest pain, shortness of breath, palpitations, diaphoresis, syncope, edema, PND, orthopnea.    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 [facility])    Prostate cancer metastatic to bone (HCC)    REM sleep behavior disorder    Sleep apnea    BiPap   Past Surgical History:  Procedure Laterality Date   APPENDECTOMY  1963   BIOPSY  12/25/2021   Procedure: BIOPSY;  Surgeon: Lemar Lofty., MD;  Location: [facility] ENDOSCOPY;  Service: Gastroenterology;;   COLONOSCOPY  2010, 2015   ESOPHAGOGASTRODUODENOSCOPY N/A  04/16/2022   Procedure: ESOPHAGOGASTRODUODENOSCOPY (EGD);  Surgeon: Lemar Lofty., MD;  Location: [facility] ENDOSCOPY;  Service: Gastroenterology;  Laterality: N/A;   ESOPHAGOGASTRODUODENOSCOPY (EGD) WITH PROPOFOL N/A 12/25/2021   Procedure: ESOPHAGOGASTRODUODENOSCOPY (EGD) WITH PROPOFOL;  Surgeon: Meridee Score Netty Starring., MD;  Location: [facility] ENDOSCOPY;  Service: Gastroenterology;  Laterality: N/A;   EUS N/A 12/25/2021   Procedure: UPPER ENDOSCOPIC ULTRASOUND (EUS) RADIAL;  Surgeon: Lemar Lofty., MD;  Location: [facility] ENDOSCOPY;  Service: Gastroenterology;  Laterality: N/A;   EUS N/A 04/16/2022   Procedure: UPPER ENDOSCOPIC ULTRASOUND (EUS) RADIAL;  Surgeon: Lemar Lofty., MD;  Location: [facility] ENDOSCOPY;  Service: Gastroenterology;  Laterality: N/A;   FIDUCIAL MARKER PLACEMENT N/A 04/16/2022   Procedure: FIDUCIAL MARKER PLACEMENT;  Surgeon: Lemar Lofty., MD;  Location: [facility] ENDOSCOPY;  Service: Gastroenterology;  Laterality: N/A;   FINE NEEDLE ASPIRATION  12/25/2021   Procedure: FINE NEEDLE ASPIRATION (FNA) LINEAR;  Surgeon: Lemar Lofty., MD;  Location: [facility] ENDOSCOPY;  Service: Gastroenterology;;   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   KYPHOPLASTY N/A 08/15/2020   Procedure: L2 compression fracture;  Surgeon: Kennedy Bucker, MD;  Location: [facility] ORS;  Service: Orthopedics;  Laterality: N/A;   KYPHOPLASTY N/A 08/29/2020   Procedure: T8 KYPHOPLASTY;  Surgeon: Kennedy Bucker, MD;  Location: [facility] ORS;  Service: Orthopedics;  Laterality: N/A;   KYPHOPLASTY N/A 11/12/2020   Procedure: L1 KYPHOPLASTY;  Surgeon: Kennedy Bucker, MD;  Location: [facility] ORS;  Service: Orthopedics;  Laterality: N/A;   PORTACATH PLACEMENT N/A 02/05/2022  Procedure: INSERTION PORT-A-CATH;  Surgeon: Fritzi Mandes, MD;  Location: [facility] ORS;  Service: General;  Laterality: N/A;   Social History   Tobacco Use   Smoking status: Former    Types: Cigars    Quit date: 09/07/2010    Years since quitting: 12.4    Smokeless tobacco: Never  Substance Use Topics   Alcohol use: Yes    Comment: 1 can beer/day. occas.    Family History  Problem Relation Age of Onset   Heart disease Maternal Grandfather    Diabetes Paternal Grandfather    Breast cancer Daughter     Marital Status: Widowed  ROS  Review of Systems  Cardiovascular:  Negative for chest pain, leg swelling and palpitations.  Musculoskeletal:  Positive for joint pain.   Objective      02/07/2023   10:43 AM 02/07/2023    8:01 AM 02/07/2023    3:34 AM  Vitals with BMI  Systolic 102 116 94  Diastolic 71 57 64  Pulse 111 99 95    Blood pressure 102/71, pulse (!) 111, temperature 97.9 F (36.6 C), temperature source Oral, resp. rate 20, height 5\' 5"  (1.651 m), weight 68.9 kg, SpO2 94 %.    Physical Exam Vitals reviewed.  HENT:     Head: Normocephalic and atraumatic.  Cardiovascular:     Rate and Rhythm: Tachycardia present. Rhythm irregular.  Pulmonary:     Effort: Pulmonary effort is normal.     Breath sounds: Normal breath sounds.  Abdominal:     General: Bowel sounds are normal.  Musculoskeletal:     Right lower leg: No edema.     Left lower leg: No edema.  Skin:    General: Skin is warm and dry.  Neurological:     Mental Status: He is alert.    Laboratory examination:   Recent Labs    02/04/23 0135 02/05/23 0317 02/06/23 0145  NA 135 137 132*  K 4.0 4.1 3.9  CL 102 103 102  CO2 24 23 21*  GLUCOSE 140* 114* 148*  BUN 16 18 13   CREATININE 1.11 1.12 1.00  CALCIUM 9.1 8.8* 8.3*  GFRNONAA >60 >60 >60   estimated creatinine clearance is 50.4 mL/min (by C-G formula based on SCr of 1 mg/dL).     Latest Ref Rng & Units 02/06/2023    1:45 AM 02/05/2023    3:17 AM 02/04/2023    1:35 AM  CMP  Glucose 70 - 99 mg/dL 829  562  130   BUN 8 - 23 mg/dL 13  18  16    Creatinine 0.61 - 1.24 mg/dL 8.65  7.84  6.96   Sodium 135 - 145 mmol/L 132  137  135   Potassium 3.5 - 5.1 mmol/L 3.9  4.1  4.0   Chloride 98 - 111 mmol/L  102  103  102   CO2 22 - 32 mmol/L 21  23  24    Calcium 8.9 - 10.3 mg/dL 8.3  8.8  9.1   Total Protein 6.5 - 8.1 g/dL 5.8  6.1    Total Bilirubin 0.3 - 1.2 mg/dL 1.5  1.3    Alkaline Phos 38 - 126 U/L 38  47    AST 15 - 41 U/L 27  22    ALT 0 - 44 U/L 6  6        Latest Ref Rng & Units 02/07/2023   11:24 AM 02/06/2023    1:45 AM 02/05/2023  3:17 AM  CBC  WBC 4.0 - 10.5 K/uL 10.5  7.1  6.8   Hemoglobin 13.0 - 17.0 g/dL 16.1  09.6  04.5   Hematocrit 39.0 - 52.0 % 33.8  35.4  38.8   Platelets 150 - 400 K/uL 178  172  168    Lipid Panel No results for input(s): "CHOL", "TRIG", "LDLCALC", "VLDL", "HDL", "CHOLHDL", "LDLDIRECT" in the last 8760 hours.  HEMOGLOBIN A1C No results found for: "HGBA1C", "MPG" TSH No results for input(s): "TSH" in the last 8760 hours. BNP (last 3 results) No results for input(s): "BNP" in the last 8760 hours. Cardiac Panel (last 3 results) No results for input(s): "CKTOTAL", "CKMB", "TROPONINIHS", "RELINDX" in the last 72 hours.   Medications and allergies   Allergies  Allergen Reactions   Influenza Vaccines Anaphylaxis    Flu shot: 1974 anaphylaxis. 2nd time swollen arm   Influenza Virus Vaccine Other (See Comments)     Current Meds  Medication Sig   acetaminophen (TYLENOL) 500 MG tablet Take 1 tablet (500 mg total) by mouth every 6 (six) hours as needed for moderate pain or mild pain.   apixaban (ELIQUIS) 5 MG TABS tablet Take 1 tablet (5 mg total) by mouth 2 (two) times daily.   carbidopa-levodopa (SINEMET CR) 50-200 MG tablet Take 1 tablet by mouth at bedtime.   carbidopa-levodopa (SINEMET IR) 25-100 MG tablet Take 3 tablets at 7 AM, 2 tablets at 11 AM, 3 tablets at 3 PM, 2 tablets at 7 PM   clonazePAM (KLONOPIN) 0.5 MG tablet Take 0.5 mg by mouth at bedtime.   cyanocobalamin (VITAMIN B12) 1000 MCG tablet Take 1,000 mcg by mouth daily.   diltiazem (CARDIZEM SR) 60 MG 12 hr capsule Take 1 capsule (60 mg total) by mouth 2 (two) times daily. Hold if  systolic blood pressure (top number) less than 110 mmHg or pulse less than 55 bpm.   Glycerin-Hypromellose-PEG 400 (VISINE DRY EYE OP) Place 1 drop into both eyes 2 (two) times daily as needed (eye irritation).    ketoconazole (NIZORAL) 2 % shampoo Apply 1 application  topically 3 (three) times a week.   omeprazole (PRILOSEC) 20 MG capsule TAKE 1 CAPSULE DAILY   Pancrelipase, Lip-Prot-Amyl, (PANCREAZE) 37000-97300 units CPEP Take 2 capsules (74,000 Units total) by mouth with breakfast, with lunch, and with evening meal. Take 2 capsules with each meal and 1 with snack   polyethylene glycol powder (MIRALAX) 17 GM/SCOOP powder Take 17 g by mouth in the morning and at bedtime.   rosuvastatin (CRESTOR) 10 MG tablet TAKE 1 TABLET DAILY   tiZANidine (ZANAFLEX) 2 MG tablet Take 2 mg by mouth once.   triamcinolone cream (KENALOG) 0.1 % Apply 1 application topically 2 (two) times daily. (Patient taking differently: Apply 1 application  topically 2 (two) times daily as needed (itching).)    Scheduled Meds:  apixaban  5 mg Oral BID   carbidopa-levodopa  1 tablet Oral QHS   carbidopa-levodopa  3 tablet Oral BID   And   carbidopa-levodopa  2 tablet Oral BID   Chlorhexidine Gluconate Cloth  6 each Topical Daily   clonazePAM  0.5 mg Oral QHS   cyanocobalamin  1,000 mcg Oral Daily   diltiazem  60 mg Oral BID   docusate sodium  100 mg Oral BID   lipase/protease/amylase  72,000 Units Oral TID with meals   pantoprazole  40 mg Oral Daily   polyethylene glycol  17 g Oral Q breakfast   rosuvastatin  10 mg Oral Daily   Continuous Infusions:  sodium chloride 10 mL/hr at 02/05/23 2051   PRN Meds:.sodium chloride, acetaminophen **OR** acetaminophen, albuterol, alum & mag hydroxide-simeth, bisacodyl, HYDROmorphone (DILAUDID) injection, menthol-cetylpyridinium **OR** phenol, metoprolol tartrate, ondansetron **OR** ondansetron (ZOFRAN) IV, oxyCODONE, polyethylene glycol, tiZANidine, traZODone   I/O last 3  completed shifts: In: 1989.3 [P.O.:360; I.V.:1579.3; IV Piggyback:50] Out: 1900 [Urine:1900] No intake/output data recorded.  Net IO Since Admission: 304.31 mL [02/07/23 1154]   Radiology:   Imaging results have been reviewed and DG HIP UNILAT WITH PELVIS 1V RIGHT  Result Date: 02/05/2023 CLINICAL DATA:  Elective surgery. EXAM: DG HIP (WITH OR WITHOUT PELVIS) 1V RIGHT COMPARISON:  None Available. FINDINGS: Two fluoroscopic spot views of the pelvis and right hip obtained in the operating room. Images during hip arthroplasty. Fluoroscopy time 10 seconds. Dose 1.0 mGy. IMPRESSION: Fluoroscopic spot views during right hip arthroplasty. Electronically Signed   By: Narda Rutherford M.D.   On: 02/05/2023 18:31   DG C-Arm 1-60 Min-No Report  Result Date: 02/05/2023 Fluoroscopy was utilized by the requesting physician.  No radiographic interpretation.    Cardiac Studies:   Cardioversion 01/18/2023: 150J x1  in ED    EKG: 10/26/2022: Atrial fibrillation, 88 bpm, RBBB, left axis.   11/20/2022: Sinus bradycardia, 52 bpm, compared to 10/26/2022 atrial fibrillation has now transition to sinus.   Echocardiogram: 04/30/2021: Normal LV systolic function with EF 63%. Moderate concentric hypertrophy of the left ventricle. Normal global wall motion. Left ventricle cavity is normal in size.  Doppler evidence of grade II (pseudonormal) diastolic dysfunction, elevated LAP.  Left atrial cavity is moderately dilated. Trileaflet aortic valve. Moderate (Grade II) aortic regurgitation. Mild to moderate mitral regurgitation. Mild tricuspid regurgitation.  No evidence of pulmonary hypertension.   Stress Testing: Lexiscan Tetrofosmin stress test 04/30/2021: 1 Day Rest/Stress Protocol. Stress EKG is non-diagnostic for ischemia as its a pharmacologic stress test using Lexiscan. Normal myocardial perfusion without convincing evidence of reversible myocardial ischemia or prior infarct.   Left ventricular ejection  fraction is 55% with normal wall motion.   Low risk study. No prior studies for comparison.    Heart Catheterization: None   Cardiac monitor [facility] Patch): December 23, 2022 -Jan 06, 2023 Dominant rhythm sinus, followed by atrial fibrillation/flutter (burden 7%). Overall heart rate 36-179 bpm. Avg HR 62 bpm. Sinus heart rate 36 - 119 bpm. Avg HR 58 bpm. No atrial fibrillation, high grade AV block, pauses (3 seconds or longer). Total ventricular ectopic burden <1%. 1 episode of NSVT (auto triggered), on 12/24/2022 at 6:19 pm, 5 beats, 2.6 seconds duration, max HR 126 bpm.  Total supraventricular ectopic burden 4% (predominantly as isolated beats).  Atrial fibrillation/flutter burden 7% during the monitoring period.  Majority of the episodes were atrial flutter.  Fastest episode occurred on 01/02/2023 at 10:52 am max HR 179 bpm.  Longest episode of atrial flutter 22hr 56 min on 01/02/2023.  Patient triggered events: 3. Underlying rhythm sinus with occasional PACs.  Assessment & Recommendations:   Afib RVR, suspect exacerbated by pain from acute hip fracture Rate control: Diltiazem. IV metoprolol 5 mg IV PRN for sustained RVR ordered. Pressures are soft, if needed for additional rate control, could add Corlanor as it will not affect his BP. He had to be take off cardizem gtt due to low BP. Rhythm control: N/A. Thromboembolic prophylaxis: Eliquis [id number] SCORE is 3 which correlates to 3% risk of stroke per year (age, atherosclerosis).  Diagnosed in 2020. Status post cardioversion x 1  in May 2024 by ED provider. Zio patch notes atrial fibrillation/flutter burden to be around 7%. Patient prefers rate control strategy.   Long term (current) use of anticoagulants Does not endorse evidence of bleeding. He is on Eliquis 5 mg BID. Ok to hold peri-operatively if needed.   Atherosclerosis of aorta (HCC) Chronic and stable. Currently on statin therapy.     Clotilde Dieter, DO 02/07/2023,  11:54 AM Office: [phone]

## 2023-02-07 NOTE — Progress Notes (Addendum)
Physical Therapy Treatment Patient Details Name: [name] [name] MRN: [id number] DOB: November 13, 1940 Today's Date: 02/07/2023   History of Present Illness [name] [name] is a 82 y.o. male admitted 5/31 after fall. Sustained right femoral neck fracture. Underwent right hemiarthroplasty on 5/31.  [id number] cancer, pancreatic cancer, paroxysmal atrial fibrillation on Eliquis, Parkinsons    PT Comments    Pt admitted with above diagnosis. Pt limited today due to incr HR to 174 bpm.  MD came in and was made aware of incr HR limiting treatment.   Pt will need post acute therapy up to 2 hours day. Pt currently with functional limitations due to balance and endurance deficits. Pt will benefit from acute skilled PT to increase their independence and safety with mobility to allow discharge.      Recommendations for follow up therapy are one component of a multi-disciplinary discharge planning process, led by the attending physician.  Recommendations may be updated based on patient status, additional functional criteria and insurance authorization.  Follow Up Recommendations  Can patient physically be transported by private vehicle: No    Assistance Recommended at Discharge Intermittent Supervision/Assistance  Patient can return home with the following Two people to help with walking and/or transfers;Two people to help with bathing/dressing/bathroom;Assistance with cooking/housework;Assist for transportation;Help with stairs or ramp for entrance   Equipment Recommendations  Other (comment) (TBA next venue)    Recommendations for Other Services       Precautions / Restrictions Precautions Precautions: Fall Precaution Comments: No hip precautions per MD Restrictions Weight Bearing Restrictions: Yes RLE Weight Bearing: Weight bearing as tolerated     Mobility  Bed Mobility Overal bed mobility: Needs Assistance Bed Mobility: Supine to Sit     Supine to sit: Max assist, +2 for physical  assistance     General bed mobility comments: HR up to 178bpm sitting EOB, RN notified    Transfers                   General transfer comment: deferred due to elevated HR    Ambulation/Gait                   Stairs             Wheelchair Mobility    Modified Rankin (Stroke Patients Only)       Balance Overall balance assessment: Needs assistance Sitting-balance support: Bilateral upper extremity supported, Feet supported Sitting balance-Leahy Scale: Poor Sitting balance - Comments: strong posterior lean Postural control: Posterior lean                                  Cognition Arousal/Alertness: Lethargic Behavior During Therapy: Flat affect Overall Cognitive Status: Within Functional Limits for tasks assessed                                          Exercises Total Joint Exercises Ankle Circles/Pumps: AROM, Both, 5 reps, Supine Quad Sets: AROM, Both, 5 reps, Supine Heel Slides: AAROM, Both, 5 reps, Supine    General Comments General comments (skin integrity, edema, etc.): son in room, HR elevated to 178 bpm at highest during session, frequently ranging 130-150bpm, RN aware      Pertinent Vitals/Pain Pain Assessment Pain Assessment: Faces Faces Pain Scale: Hurts whole lot Pain Location: right hip Pain Descriptors /  Indicators: Grimacing, Guarding, Discomfort, Aching Pain Intervention(s): Limited activity within patient's tolerance, Monitored during session, Repositioned, Premedicated before session    Home Living                          Prior Function            PT Goals (current goals can now be found in the care plan section) Acute Rehab PT Goals Patient Stated Goal: to go home Progress towards PT goals: Not progressing toward goals - comment (Elevated HR)    Frequency    Min 4X/week      PT Plan Current plan remains appropriate    Co-evaluation PT/OT/SLP  Co-Evaluation/Treatment: Yes Reason for Co-Treatment: For patient/therapist safety;To address functional/ADL transfers;Complexity of the patient's impairments (multi-system involvement) PT goals addressed during session: Mobility/safety with mobility        AM-PAC PT "6 Clicks" Mobility   Outcome Measure  Help needed turning from your back to your side while in a flat bed without using bedrails?: Total Help needed moving from lying on your back to sitting on the side of a flat bed without using bedrails?: Total Help needed moving to and from a bed to a chair (including a wheelchair)?: Total Help needed standing up from a chair using your arms (e.g., wheelchair or bedside chair)?: Total Help needed to walk in hospital room?: Total Help needed climbing 3-5 steps with a railing? : Total 6 Click Score: 6    End of Session Equipment Utilized During Treatment: Gait belt Activity Tolerance: Patient limited by fatigue;Patient limited by pain Patient left: with call bell/phone within reach;with family/visitor present;in bed;with bed alarm set Nurse Communication: Mobility status;Need for lift equipment (use stedy) PT Visit Diagnosis: Unsteadiness on feet (R26.81);Muscle weakness (generalized) (M62.81);Pain Pain - Right/Left: Right Pain - part of body: Leg     Time: 1610-9604 PT Time Calculation (min) (ACUTE ONLY): 26 min  Charges:  $Therapeutic Activity: 8-22 mins                    [facility] M,PT Acute Rehab Services [phone]    Bevelyn Buckles 02/07/2023, 8:22 PM

## 2023-02-08 ENCOUNTER — Encounter (HOSPITAL_COMMUNITY): Payer: Self-pay | Admitting: Orthopedic Surgery

## 2023-02-08 DIAGNOSIS — I480 Paroxysmal atrial fibrillation: Secondary | ICD-10-CM | POA: Diagnosis not present

## 2023-02-08 DIAGNOSIS — S72001A Fracture of unspecified part of neck of right femur, initial encounter for closed fracture: Secondary | ICD-10-CM | POA: Diagnosis not present

## 2023-02-08 DIAGNOSIS — G20A1 Parkinson's disease without dyskinesia, without mention of fluctuations: Secondary | ICD-10-CM | POA: Diagnosis not present

## 2023-02-08 DIAGNOSIS — C259 Malignant neoplasm of pancreas, unspecified: Secondary | ICD-10-CM | POA: Diagnosis not present

## 2023-02-08 LAB — COMPREHENSIVE METABOLIC PANEL
ALT: 8 U/L (ref 0–44)
AST: 74 U/L — ABNORMAL HIGH (ref 15–41)
Albumin: 2.6 g/dL — ABNORMAL LOW (ref 3.5–5.0)
Alkaline Phosphatase: 52 U/L (ref 38–126)
Anion gap: 9 (ref 5–15)
BUN: 20 mg/dL (ref 8–23)
CO2: 21 mmol/L — ABNORMAL LOW (ref 22–32)
Calcium: 8.4 mg/dL — ABNORMAL LOW (ref 8.9–10.3)
Chloride: 103 mmol/L (ref 98–111)
Creatinine, Ser: 1.09 mg/dL (ref 0.61–1.24)
GFR, Estimated: >60 mL/min (ref >60)
Glucose, Bld: 112 mg/dL — ABNORMAL HIGH (ref 70–99)
Potassium: 3.9 mmol/L (ref 3.5–5.1)
Sodium: 133 mmol/L — ABNORMAL LOW (ref 135–145)
Total Bilirubin: 1.4 mg/dL — ABNORMAL HIGH (ref 0.3–1.2)
Total Protein: 5.9 g/dL — ABNORMAL LOW (ref 6.5–8.1)

## 2023-02-08 LAB — CBC WITH DIFFERENTIAL/PLATELET
Abs Immature Granulocytes: 0.05 10*3/uL (ref 0.00–0.07)
Basophils Absolute: 0 10*3/uL (ref 0.0–0.1)
Basophils Relative: 1 %
Eosinophils Absolute: 0.2 10*3/uL (ref 0.0–0.5)
Eosinophils Relative: 2 %
HCT: 35 % — ABNORMAL LOW (ref 39.0–52.0)
Hemoglobin: 11.5 g/dL — ABNORMAL LOW (ref 13.0–17.0)
Immature Granulocytes: 1 %
Lymphocytes Relative: 14 %
Lymphs Abs: 1.2 10*3/uL (ref 0.7–4.0)
MCH: 29.6 pg (ref 26.0–34.0)
MCHC: 32.9 g/dL (ref 30.0–36.0)
MCV: 90.2 fL (ref 80.0–100.0)
Monocytes Absolute: 1 10*3/uL (ref 0.1–1.0)
Monocytes Relative: 12 %
Neutro Abs: 5.9 10*3/uL (ref 1.7–7.7)
Neutrophils Relative %: 70 %
Platelets: 211 10*3/uL (ref 150–400)
RBC: 3.88 MIL/uL — ABNORMAL LOW (ref 4.22–5.81)
RDW: 13.4 % (ref 11.5–15.5)
WBC: 8.3 10*3/uL (ref 4.0–10.5)
nRBC: 0 % (ref 0.0–0.2)

## 2023-02-08 LAB — PHOSPHORUS: Phosphorus: 3.1 mg/dL (ref 2.5–4.6)

## 2023-02-08 LAB — MAGNESIUM: Magnesium: 2.4 mg/dL (ref 1.7–2.4)

## 2023-02-08 MED ORDER — SENNOSIDES-DOCUSATE SODIUM 8.6-50 MG PO TABS
1.0000 | ORAL_TABLET | Freq: Two times a day (BID) | ORAL | Status: DC
  Administered 2023-02-08: 1 via ORAL
  Filled 2023-02-08 (×2): qty 1

## 2023-02-08 MED ORDER — POLYETHYLENE GLYCOL 3350 17 G PO PACK
17.0000 g | PACK | Freq: Two times a day (BID) | ORAL | Status: DC
  Administered 2023-02-08: 17 g via ORAL
  Filled 2023-02-08 (×2): qty 1

## 2023-02-08 MED ORDER — ORAL CARE MOUTH RINSE: 15.0000 mL | OROMUCOSAL | Status: DC | PRN

## 2023-02-08 MED ORDER — SODIUM CHLORIDE 0.9 % IV BOLUS
500.0000 mL | Freq: Once | INTRAVENOUS | Status: AC
  Administered 2023-02-08: 500 mL via INTRAVENOUS

## 2023-02-08 MED ORDER — BISACODYL 10 MG RE SUPP: 10.0000 mg | Freq: Every day | RECTAL | Status: DC | PRN

## 2023-02-08 NOTE — Progress Notes (Signed)
Subjective:  Hip hurts No cardiac complaints   Current Facility-Administered Medications:    0.9 %  sodium chloride infusion, , Intravenous, PRN, Marguerita Merles Latif, DO, Last Rate: 10 mL/hr at 02/08/23 0700, Infusion Verify at 02/08/23 0700   acetaminophen (TYLENOL) tablet 650 mg, 650 mg, Oral, Q6H PRN, 650 mg at 02/03/23 2121 **OR** acetaminophen (TYLENOL) suppository 650 mg, 650 mg, Rectal, Q6H PRN, Marshia Ly, PA-C   albuterol (PROVENTIL) (2.5 MG/3ML) 0.083% nebulizer solution 2.5 mg, 2.5 mg, Nebulization, Q2H PRN, Bethune, James, PA-C   alum & mag hydroxide-simeth (MAALOX/MYLANTA) 200-200-20 MG/5ML suspension 30 mL, 30 mL, Oral, Q4H PRN, Orma Flaming, James, PA-C   apixaban (ELIQUIS) tablet 5 mg, 5 mg, Oral, BID, Bethune, James, PA-C, 5 mg at 02/08/23 0454   bisacodyl (DULCOLAX) EC tablet 5 mg, 5 mg, Oral, Daily PRN, Orma Flaming, James, PA-C   carbidopa-levodopa (SINEMET CR) 50-200 MG per tablet controlled release 1 tablet, 1 tablet, Oral, QHS, Marshia Ly, PA-C, 1 tablet at 02/07/23 2117   carbidopa-levodopa (SINEMET IR) 25-100 MG per tablet immediate release 3 tablet, 3 tablet, Oral, BID, 3 tablet at 02/08/23 0981 **AND** carbidopa-levodopa (SINEMET IR) 25-100 MG per tablet immediate release 2 tablet, 2 tablet, Oral, BID, Marshia Ly, PA-C, 2 tablet at 02/07/23 1815   Chlorhexidine Gluconate Cloth 2 % PADS 6 each, 6 each, Topical, Daily, Marguerita Merles [place], DO, 6 each at 02/08/23 0827   clonazePAM (KLONOPIN) tablet 0.5 mg, 0.5 mg, Oral, QHS, Bethune, James, PA-C, 0.5 mg at 02/07/23 2117   cyanocobalamin (VITAMIN B12) tablet 1,000 mcg, 1,000 mcg, Oral, Daily, Marshia Ly, PA-C, 1,000 mcg at 02/08/23 1914   diltiazem (CARDIZEM SR) 12 hr capsule 60 mg, 60 mg, Oral, BID, Sheikh, Omair Latif, DO, 60 mg at 02/07/23 7829   docusate sodium (COLACE) capsule 100 mg, 100 mg, Oral, BID, Marshia Ly, PA-C, 100 mg at 02/08/23 5621   HYDROmorphone (DILAUDID) injection 0.5 mg, 0.5 mg, Intravenous,  Q4H PRN, Marshia Ly, PA-C, 0.5 mg at 02/05/23 1743   lipase/protease/amylase (CREON) capsule 72,000 Units, 72,000 Units, Oral, TID with meals, Marshia Ly, PA-C, 72,000 Units at 02/08/23 3086   menthol-cetylpyridinium (CEPACOL) lozenge 3 mg, 1 lozenge, Oral, PRN **OR** phenol (CHLORASEPTIC) mouth spray 1 spray, 1 spray, Mouth/Throat, PRN, Marshia Ly, PA-C   metoprolol tartrate (LOPRESSOR) injection 5 mg, 5 mg, Intravenous, Q6H PRN, Sheikh, Omair Latif, DO   ondansetron (ZOFRAN) tablet 4 mg, 4 mg, Oral, Q6H PRN **OR** ondansetron (ZOFRAN) injection 4 mg, 4 mg, Intravenous, Q6H PRN, Marshia Ly, PA-C   Oral care mouth rinse, 15 mL, Mouth Rinse, PRN, Marland Mcalpine, Omair Latif, DO   oxyCODONE (Oxy IR/ROXICODONE) immediate release tablet 2.5-5 mg, 2.5-5 mg, Oral, Q6H PRN, Marshia Ly, PA-C, 5 mg at 02/08/23 0348   pantoprazole (PROTONIX) EC tablet 40 mg, 40 mg, Oral, Daily, Marshia Ly, PA-C, 40 mg at 02/08/23 5784   polyethylene glycol (MIRALAX / GLYCOLAX) packet 17 g, 17 g, Oral, Daily PRN, Marshia Ly, PA-C, 17 g at 02/06/23 2045   polyethylene glycol (MIRALAX / GLYCOLAX) packet 17 g, 17 g, Oral, Q breakfast, Marshia Ly, PA-C, 17 g at 02/08/23 6962   rosuvastatin (CRESTOR) tablet 10 mg, 10 mg, Oral, Daily, Marshia Ly, PA-C, 10 mg at 02/08/23 9528   tiZANidine (ZANAFLEX) tablet 2 mg, 2 mg, Oral, Q8H PRN, Marshia Ly, PA-C   traZODone (DESYREL) tablet 25 mg, 25 mg, Oral, QHS PRN, Marshia Ly, PA-C, 25 mg at 02/04/23 0033   Objective:  Vital Signs in the last 24 hours:  Temp:  [97.6 F (36.4 C)-98.7 F (37.1 C)] 98 F (36.7 C) (06/03 0730) Pulse Rate:  [84-111] 94 (06/03 0700) Resp:  [16-22] 19 (06/03 0700) BP: (90-112)/(58-78) 96/69 (06/03 0823) SpO2:  [93 %-99 %] 98 % (06/03 0700)  Intake/Output from previous day: 06/02 0701 - 06/03 0700 In: 453.7 [I.V.:195.7; IV Piggyback:258] Out: 1000 [Urine:1000]  Physical Exam Vitals and nursing note reviewed.   Constitutional:      General: He is not in acute distress. Neck:     Vascular: No JVD.  Cardiovascular:     Rate and Rhythm: Normal rate. Rhythm irregular.     Heart sounds: Normal heart sounds. No murmur heard. Pulmonary:     Effort: Pulmonary effort is normal.     Breath sounds: Normal breath sounds. No wheezing or rales.  Musculoskeletal:     Right lower leg: No edema.     Left lower leg: No edema.      Imaging/tests reviewed and independently interpreted: CXR 02/05/2023: No active cardiopulmonary disease.    Cardiac Studies:  Telemetry 02/08/2023: Afib with rate around 100 bpm  EKG 02/05/2023: Sinus rhythm with marked sinus arrhythmia with 1st degree A-V block Left axis deviation Right bundle branch block Abnormal ECG Since last tracing HEART RATE has increased  Echocardiogram 04/30/2021: Normal LV systolic function with EF 63%. Moderate concentric hypertrophy of the left ventricle. Normal global wall motion. Left ventricle cavity is normal in size. Doppler evidence of grade II (pseudonormal) diastolic dysfunction, elevated LAP. Left atrial cavity is moderately dilated. Trileaflet aortic valve.  Moderate (Grade II) aortic regurgitation. Mild to moderate mitral regurgitation. Mild tricuspid regurgitation. No evidence of pulmonary hypertension.    Assessment & Recommendations:  82 y/o Caucasian male with Parkinson's disease OSA, PAF/flutter, h/o prostate cancer, pancreatic cancer, aortic atherosclerosis admitted with acute hip fracture. Cardiology following for Afib w/RVR  Paroxysmal Afib: Rate around 100 bpm Diltiazem held this morning due to low normal BP. I reckon we should be able to resume it later in the day. Increase oral intake. Continue diltiazem 90 bid for now. CHA2DS2VASc score 3, annual stroke risk 3.6%. Continue Eliquis 5 mg bid.  As long as Afib rata stays <110 bpm, no further changes in current management.  Cardiology will sign  off.   Discussed interpretation of tests and management recommendations with the primary team   Elder Negus, MD Pager: [phone] Office: [phone]

## 2023-02-08 NOTE — Plan of Care (Signed)

## 2023-02-08 NOTE — Progress Notes (Addendum)
RE: [name] [name]. Genther   Date of Birth: 1941/07/20  Date: 02/08/2023  To Whom It May Concern:  Please be advised that the above-named patient will require a short-term nursing home stay - anticipated 30 days or less for rehabilitation and strengthening. The plan is for return home.  MD signature  Date

## 2023-02-08 NOTE — [place] FL2 (Signed)
[place] MEDICAID FL2 LEVEL OF CARE FORM     IDENTIFICATION  Patient Name: [name] [name] Birthdate: [birth year] Sex: male Admission Date (Current Location): 02/03/2023  [facility] and [place] Number:  Producer, television/film/video and Address:  [facility] [place], [address] [place], [place] [address]      Provider Number: [id number]  Attending Physician Name and Address:  Merlene Laughter, DO  Relative Name and Phone Number:  [name] (daughter) [phone] Perlie Gold (son) [phone]    Current Level of Care: Hospital Recommended Level of Care: Skilled Nursing Facility Prior Approval Number:    Date Approved/Denied:   PASRR Number: PASRR under review  Discharge Plan: SNF    Current Diagnoses: Patient Active Problem List   Diagnosis Date Noted   A-fib [facility]) 02/03/2023   Displaced fracture of right femoral neck (HCC) 02/03/2023   Femur fracture, right (HCC) 02/03/2023   Genetic testing 02/10/2022   Local infection due to Port-A-Cath 02/10/2022   Pancreatic cancer (HCC) 01/01/2022   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) 07/08/2020   Atherosclerosis of aorta (HCC) 04/10/2020   Heart palpitations 12/29/2019   Atrial flutter with rapid ventricular response (HCC) 08/18/2019   New onset atrial flutter (HCC) 08/16/2019   Encounter for antineoplastic chemotherapy 07/17/2019   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/24/2019   Establishing care with new doctor, encounter for 09/26/2018   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/30/2015   Parkinson's disease 11/07/2015   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   Hx of colonic polyps 05/18/2014   BPH (benign prostatic hyperplasia) 01/12/2014   Sleep apnea treated with nocturnal BiPAP 01/17/2012   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    Orientation RESPIRATION BLADDER Height & Weight     Self, Time, Situation, Place  Normal Continent Weight: 152 lb (68.9  kg) Height:  5\' 5"  (165.1 cm)  BEHAVIORAL SYMPTOMS/MOOD NEUROLOGICAL BOWEL NUTRITION STATUS        Diet (Please see discharge summary)  AMBULATORY STATUS COMMUNICATION OF NEEDS Skin   Extensive Assist Verbally Other (Comment) (dry,Abrasion,arm,R,Ecchymosis,arm,R,Erythema,arm,L,Hip,R,Wound/Incision LDAs,Incision closed,Hip,R.)                       Personal Care Assistance Level of Assistance  Bathing, Feeding, Dressing Bathing Assistance: Maximum assistance Feeding assistance: Limited assistance Dressing Assistance: Maximum assistance     Functional Limitations Info  Sight, Hearing, Speech Sight Info:  Financial trader) Hearing Info: Adequate Speech Info: Adequate    SPECIAL CARE FACTORS FREQUENCY  PT (By licensed PT), OT (By licensed OT)     PT Frequency: 5x min weekly OT Frequency: 5x  min weekly            Contractures Contractures Info: Not present    Additional Factors Info  Code Status, Allergies, Psychotropic Code Status Info: FULL Allergies Info: Influenza Vaccines,Influenza Virus Vaccine Psychotropic Info: clonazePAM (KLONOPIN) tablet 0.5 mg daily at bedtime         Current Medications (02/08/2023):  This is the current hospital active medication list Current Facility-Administered Medications  Medication Dose Route Frequency Provider Last Rate Last Admin   0.9 %  sodium chloride infusion   Intravenous PRN Marguerita Merles Latif, DO 10 mL/hr at 02/08/23 0700 Infusion Verify at 02/08/23 0700   acetaminophen (TYLENOL) tablet 650 mg  650 mg Oral Q6H PRN Marshia Ly, PA-C   650 mg at 02/03/23 2121   Or   acetaminophen (TYLENOL) suppository 650 mg  650  mg Rectal Q6H PRN Marshia Ly, PA-C       albuterol (PROVENTIL) (2.5 MG/3ML) 0.083% nebulizer solution 2.5 mg  2.5 mg Nebulization Q2H PRN Marshia Ly, PA-C       alum & mag hydroxide-simeth (MAALOX/MYLANTA) 200-200-20 MG/5ML suspension 30 mL  30 mL Oral Q4H PRN Marshia Ly, PA-C       apixaban Everlene Balls)  tablet 5 mg  5 mg Oral BID Marshia Ly, PA-C   5 mg at 02/08/23 2130   bisacodyl (DULCOLAX) EC tablet 5 mg  5 mg Oral Daily PRN Marshia Ly, PA-C       bisacodyl (DULCOLAX) suppository 10 mg  10 mg Rectal Daily PRN Sheikh, Omair Latif, DO       carbidopa-levodopa (SINEMET CR) 50-200 MG per tablet controlled release 1 tablet  1 tablet Oral QHS Marshia Ly, PA-C   1 tablet at 02/07/23 2117   carbidopa-levodopa (SINEMET IR) 25-100 MG per tablet immediate release 3 tablet  3 tablet Oral BID Marshia Ly, PA-C   3 tablet at 02/08/23 8657   And   carbidopa-levodopa (SINEMET IR) 25-100 MG per tablet immediate release 2 tablet  2 tablet Oral BID Marshia Ly, PA-C   2 tablet at 02/08/23 1211   Chlorhexidine Gluconate Cloth 2 % PADS 6 each  6 each Topical Daily Marguerita Merles [place], DO   6 each at 02/08/23 0827   clonazePAM (KLONOPIN) tablet 0.5 mg  0.5 mg Oral QHS Marshia Ly, PA-C   0.5 mg at 02/07/23 2117   cyanocobalamin (VITAMIN B12) tablet 1,000 mcg  1,000 mcg Oral Daily Marshia Ly, PA-C   1,000 mcg at 02/08/23 8469   diltiazem (CARDIZEM SR) 12 hr capsule 60 mg  60 mg Oral BID Marguerita Merles [place], DO   60 mg at 02/07/23 6295   HYDROmorphone (DILAUDID) injection 0.5 mg  0.5 mg Intravenous Q4H PRN Marshia Ly, PA-C   0.5 mg at 02/05/23 1743   lipase/protease/amylase (CREON) capsule 72,000 Units  72,000 Units Oral TID with meals Marshia Ly, PA-C   72,000 Units at 02/08/23 1211   menthol-cetylpyridinium (CEPACOL) lozenge 3 mg  1 lozenge Oral PRN Marshia Ly, PA-C       Or   phenol (CHLORASEPTIC) mouth spray 1 spray  1 spray Mouth/Throat PRN Marshia Ly, PA-C       metoprolol tartrate (LOPRESSOR) injection 5 mg  5 mg Intravenous Q6H PRN Sheikh, Omair Latif, DO       ondansetron [facility]) tablet 4 mg  4 mg Oral Q6H PRN Marshia Ly, PA-C       Or   ondansetron [facility]) injection 4 mg  4 mg Intravenous Q6H PRN Marshia Ly, PA-C       Oral care mouth rinse  15 mL Mouth  Rinse PRN Marland Mcalpine, Omair Latif, DO       oxyCODONE (Oxy IR/ROXICODONE) immediate release tablet 2.5-5 mg  2.5-5 mg Oral Q6H PRN Marshia Ly, PA-C   5 mg at 02/08/23 0348   pantoprazole (PROTONIX) EC tablet 40 mg  40 mg Oral Daily Marshia Ly, PA-C   40 mg at 02/08/23 2841   polyethylene glycol (MIRALAX / GLYCOLAX) packet 17 g  17 g Oral BID Marguerita Merles [place], DO   17 g at 02/08/23 1211   rosuvastatin (CRESTOR) tablet 10 mg  10 mg Oral Daily Marshia Ly, PA-C   10 mg at 02/08/23 3244   senna-docusate (Senokot-S) tablet 1 tablet  1 tablet Oral BID Marguerita Merles [place], DO   1  tablet at 02/08/23 1211   tiZANidine (ZANAFLEX) tablet 2 mg  2 mg Oral Q8H PRN Marshia Ly, PA-C       traZODone (DESYREL) tablet 25 mg  25 mg Oral QHS PRN Marshia Ly, PA-C   25 mg at 02/04/23 1610     Discharge Medications: Please see discharge summary for a list of discharge medications.  Relevant Imaging Results:  Relevant Lab Results:   Additional Information [id number]  Delilah Shan, LCSWA

## 2023-02-08 NOTE — Progress Notes (Addendum)
Physical Therapy Treatment Patient Details Name: [name] [name] MRN: [id number] DOB: [birth year] Today's Date: 02/08/2023   History of Present Illness [name] [name] is a 82 y.o. male admitted 5/31 after fall. Sustained right femoral neck fracture. Underwent right hemiarthroplasty on 5/31.  NFA:[id number] cancer, pancreatic cancer, paroxysmal atrial fibrillation on Eliquis, Parkinsons    PT Comments    Pt making slow, steady progress. Pain and Parkinson's expected to continue to make progress slow. Will continue to work on regaining mobility. Patient will benefit from continued inpatient follow up therapy, <3 hours/day   Recommendations for follow up therapy are one component of a multi-disciplinary discharge planning process, led by the attending physician.  Recommendations may be updated based on patient status, additional functional criteria and insurance authorization.  Follow Up Recommendations  Can patient physically be transported by private vehicle: No    Assistance Recommended at Discharge Intermittent Supervision/Assistance  Patient can return home with the following Two people to help with walking and/or transfers;Two people to help with bathing/dressing/bathroom;Assistance with cooking/housework;Assist for transportation;Help with stairs or ramp for entrance   Equipment Recommendations  Other (comment) (TBA next venue)    Recommendations for Other Services       Precautions / Restrictions Precautions Precautions: Fall Precaution Comments: No hip precautions per MD Restrictions Weight Bearing Restrictions: Yes RLE Weight Bearing: Weight bearing as tolerated     Mobility  Bed Mobility Overal bed mobility: Needs Assistance Bed Mobility: Supine to Sit     Supine to sit: Max assist, +2 for physical assistance     General bed mobility comments: Assist to bring legs off of bed, elevate trunk into sitting, and bring hips to EOB.    Transfers Overall transfer  level: Needs assistance Equipment used: Ambulation equipment used, Rolling walker (2 wheels) Transfers: Sit to/from Stand, Bed to chair/wheelchair/BSC Sit to Stand: +2 physical assistance, Mod assist           General transfer comment: Assist to bring hips up and correct posterior lean. Stood from bed with Stedy. Stood x 1 from chair with rolling walker with pt placing hands on walker to encourage anterior weight shift. Transfer via Lift Equipment: Stedy  Ambulation/Gait Ambulation/Gait assistance: +2 physical assistance, Mod assist Gait Distance (Feet): 2 Feet Assistive device: Rolling walker (2 wheels) Gait Pattern/deviations: Step-to pattern, Decreased step length - right, Decreased step length - left, Decreased stance time - right, Trunk flexed Gait velocity: decr Gait velocity interpretation: <1.31 ft/sec, indicative of household ambulator   General Gait Details: Assist to advance RLE as well as for balance and support   Social research officer, government Rankin (Stroke Patients Only)       Balance Overall balance assessment: Needs assistance Sitting-balance support: Bilateral upper extremity supported, Feet supported Sitting balance-Leahy Scale: Poor   Postural control: Posterior lean Standing balance support: Bilateral upper extremity supported, Reliant on assistive device for balance Standing balance-Leahy Scale: Poor Standing balance comment: Stedy and +2 min/mod for static standing                            Cognition Arousal/Alertness: Awake/alert Behavior During Therapy: WFL for tasks assessed/performed Overall Cognitive Status: Within Functional Limits for tasks assessed  Exercises Other Exercises Other Exercises: Sitting in recliner worked on trunk flexion x 5 and trunk rotation x 10.    General Comments General comments (skin integrity, edema, etc.): HR to  high 140's with activity      Pertinent Vitals/Pain Pain Assessment Pain Assessment: Faces Faces Pain Scale: Hurts whole lot Pain Location: right hip Pain Descriptors / Indicators: Grimacing, Guarding, Aching Pain Intervention(s): Limited activity within patient's tolerance, Monitored during session, Premedicated before session, Repositioned    Home Living                          Prior Function            PT Goals (current goals can now be found in the care plan section) Acute Rehab PT Goals Patient Stated Goal: to go home Progress towards PT goals: Progressing toward goals    Frequency    Min 1X/week      PT Plan Current plan remains appropriate;Frequency needs to be updated    Co-evaluation              AM-PAC PT "6 Clicks" Mobility   Outcome Measure  Help needed turning from your back to your side while in a flat bed without using bedrails?: Total Help needed moving from lying on your back to sitting on the side of a flat bed without using bedrails?: Total Help needed moving to and from a bed to a chair (including a wheelchair)?: Total Help needed standing up from a chair using your arms (e.g., wheelchair or bedside chair)?: Total Help needed to walk in hospital room?: Total Help needed climbing 3-5 steps with a railing? : Total 6 Click Score: 6    End of Session Equipment Utilized During Treatment: Gait belt Activity Tolerance: Patient limited by pain Patient left: with call bell/phone within reach;in chair;with chair alarm set;with family/visitor present Nurse Communication: Mobility status;Need for lift equipment (use stedy) PT Visit Diagnosis: Unsteadiness on feet (R26.81);Muscle weakness (generalized) (M62.81);Pain Pain - Right/Left: Right Pain - part of body: Leg     Time: 8119-1478 PT Time Calculation (min) (ACUTE ONLY): 29 min  Charges:  $Therapeutic Activity: 23-37 mins                     [facility] PT Acute  Rehabilitation Services Office [phone]    Angelina Ok [facility] 02/08/2023, 4:56 PM

## 2023-02-08 NOTE — TOC Initial Note (Addendum)
Transition of Care [facility]) - Initial/Assessment Note    Patient Details  Name: [name] [name] MRN: [id number] Date of Birth: August 14, 1941  Transition of Care [facility]) CM/SW Contact:    Delilah Shan, LCSWA Phone Number: 02/08/2023, 12:29 PM  Clinical Narrative:                  CSW received consult for possible SNF placement at time of discharge. CSW spoke with patient and family at bedside regarding PT recommendation of SNF placement at time of discharge. Patient reports he comes from [facility]. Patient expressed understanding of PT recommendation and is agreeable to SNF placement at time of discharge. Patient reports preference for [facility] . Patient gave CSW permission to fax out initial referral to [facility]. CSW discussed insurance authorization process with patient.No further questions reported at this time. Patients passr is pending. CSW will submit requested clinicals to [place] must for review. CSW LVM for [place] with [facility]. CSW awaiting call back to confirm SNF bed for patient.CSW to continue to follow and assist with discharge planning needs.   Update-3:03pm- CSW received call back from [place] with [facility] who confirmed SNF bed for patient. Patient accepted SNF bed offer with [facility].CSW will continue to follow.   Expected Discharge Plan: Skilled Nursing Facility Barriers to Discharge: Continued Medical Work up   Patient Goals and CMS Choice Patient states their goals for this hospitalization and ongoing recovery are:: SNF CMS Medicare.gov Compare Post Acute Care list provided to:: Patient Choice offered to / list presented to : Patient      Expected Discharge Plan and Services In-house Referral: Clinical Social Work     Living arrangements for the past 2 months: Independent Living Facility ([facility])                                      Prior Living Arrangements/Services Living arrangements for the past 2 months:  Independent Living Facility ([facility]) Lives with:: Self Patient language and need for interpreter reviewed:: Yes Do you feel safe going back to the place where you live?: No   SNF  Need for Family Participation in Patient Care: Yes (Comment) Care giver support system in place?: Yes (comment)   Criminal Activity/Legal Involvement Pertinent to Current Situation/Hospitalization: No - Comment as needed  Activities of Daily Living      Permission Sought/Granted Permission sought to share information with : Case Manager, Magazine features editor, Family Supports Permission granted to share information with : Yes, Verbal Permission Granted  Share Information with NAME: [name] and Sherrill Raring  Permission granted to share info w AGENCY: SNF  Permission granted to share info w Relationship: daughter and son  Permission granted to share info w Contact Information: [name] [phone]  Emotional Assessment Appearance:: Appears stated age Attitude/Demeanor/Rapport: Gracious Affect (typically observed): Calm Orientation: : Oriented to Self, Oriented to Place, Oriented to  Time, Oriented to Situation Alcohol / Substance Use: Not Applicable Psych Involvement: No (comment)  Admission diagnosis:  Femur fracture, right (HCC) [S72.91XA] Patient Active Problem List   Diagnosis Date Noted   A-fib (HCC) 02/03/2023   Displaced fracture of right femoral neck (HCC) 02/03/2023   Femur fracture, right (HCC) 02/03/2023   Genetic testing 02/10/2022   Local infection due to Port-A-Cath 02/10/2022   Pancreatic cancer (HCC) 01/01/2022   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) 07/08/2020   Atherosclerosis  of aorta (HCC) 04/10/2020   Heart palpitations 12/29/2019   Atrial flutter with rapid ventricular response (HCC) 08/18/2019   New onset atrial flutter (HCC) 08/16/2019   Encounter for antineoplastic chemotherapy 07/17/2019    Prostate cancer metastatic to bone [facility]) 05/24/2019   Establishing care with new doctor, encounter for 09/26/2018   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/30/2015   Parkinson's disease 11/07/2015   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 [place] [address] Phone: [phone] Fax: [phone]  [facility] [address] [place] [address] Phone: [phone] Fax: [phone]  [facility] [address] [place] [address] Phone: [phone] Fax: [phone]     Social Determinants of Health (SDOH) Social History: SDOH Screenings   Depression [phone]): Low Risk  (12/23/2020)  Recent Concern: Depression (PHQ2-9) - Medium Risk (11/26/2020)  Tobacco Use: Medium Risk (02/08/2023)   SDOH Interventions:     Readmission Risk Interventions     No data to display

## 2023-02-08 NOTE — Progress Notes (Signed)
Subjective: 3 Days Post-Op Procedure(s) (LRB): ANTERIOR APPROACH HEMI HIP ARTHROPLASTY (Right) Patient reports pain as mild.  Progress slow as AFib kicked in yesterday when up  Objective: Vital signs in last 24 hours: Temp:  [97.6 F (36.4 C)-98.7 F (37.1 C)] 98 F (36.7 C) (06/03 0730) Pulse Rate:  [84-111] 94 (06/03 0700) Resp:  [16-22] 19 (06/03 0700) BP: (90-112)/(58-78) 103/75 (06/03 0405) SpO2:  [93 %-99 %] 98 % (06/03 0700)  Intake/Output from previous day: 06/02 0701 - 06/03 0700 In: 453.7 [I.V.:195.7; IV Piggyback:258] Out: 1000 [Urine:1000] Intake/Output this shift: No intake/output data recorded.  Recent Labs    02/06/23 0145 02/07/23 1124  HGB 11.7* 11.3*   Recent Labs    02/06/23 0145 02/07/23 1124  WBC 7.1 10.5  RBC 3.86*  3.95* 3.70*  HCT 35.4* 33.8*  PLT 172 178   Recent Labs    02/06/23 0145 02/07/23 1124  NA 132* 129*  K 3.9 4.4  CL 102 100  CO2 21* 20*  BUN 13 18  CREATININE 1.00 1.17  GLUCOSE 148* 123*  CALCIUM 8.3* 8.3*   No results for input(s): "LABPT", "INR" in the last 72 hours.  Neurologically intact ABD soft Neurovascular intact Sensation intact distally Intact pulses distally Dorsiflexion/Plantar flexion intact No cellulitis present Compartment soft   Assessment/Plan: 3 Days Post-Op Procedure(s) (LRB): ANTERIOR APPROACH HEMI HIP ARTHROPLASTY (Right) Advance diet Up with therapy Discharge to SNF when bed available. F/u Keontae Levingston 2 weeks       Harvie Junior 02/08/2023, 8:17 AM

## 2023-02-08 NOTE — Progress Notes (Signed)
PROGRESS NOTE    [name] [name]  ZOX:[id number] DOB: [birth year] DOA: 02/03/2023 PCP: Lorenda Ishihara, MD   Brief Narrative:  The patient is an 82 year old elderly Caucasian male with a past medical history significant for but limited to prostate cancer, history of pancreatic cancer, paroxysmal atrial fibrillation on anticoagulation with Eliquis, Parkinson disease, sleep apnea as well as other comorbidities including BPH who presented to the hospital with a traumatic right hip fracture.  Patient was seen in the usual state of health yesterday morning while he was carrying a tray when he suddenly slipped on his wooden floor with his socks and fell on his right hip.  He denies any loss of consciousness and did not hit his head.  Given his fall and right hip pain EMS was called and he was brought to the emergency department and x-ray was done which confirmed a displaced closed right femoral neck fracture.    Orthopedic Surgery was consulted and hospitalist admitted this patient. Surgery was done on 02/05/2023.  Postoperatively the patient went into atrial fibrillation with RVR had to be placed on a Cardizem drip to be transferred to the cardiac floor.  Intermittently tachycardic so getting IV metoprolol as well.  PT evaluated and recommending SNF and now his anticoagulation has been resumed.  Assessment and Plan:   Displaced Fracture of Right Femoral Neck (HCC) status post right bipolar hemiarthroplasty POD2 -Inpatient admission to Medical Telemetry -Patient had a mechanical fall and slipped on his wooden floor with his socks while he was carrying a tray -DG Hip done and showed "Acute displaced fracture is seen in the neck of proximal right femur." -Pain control with Acetaminophen 650 mg po/RC q6hprn Mild Pain or Fever >/= 101, Oxycodone IR 2.5-5 mg po q6hprn Severe Pain, and IV Hydromorphone 0.5 mg q4hprn Severe Pain -She had received Tranexamic Acid 1000 mg  -Bowel Regimen with po Docusate  100 mg po BID and Miralax 17 grams po Dailyprn -VTE prophylaxis with enoxaparin 40 mg subcu every 24 for now and this has not been transitioned back to his home anticoagulant with apixaban 5 mg p.o. twice daily -IVF now discontinued  -Patient had surgical intervention on 02/05/2023 -PT OT evaluated and recommending SNF and Ortho recommends weightbearing as tolerated THA soft use walker and therapist must be present -They are recommending SCDs for 72 hours and continuing Eliquis twice daily   Hx of Pancreatic Cancer (HCC)  -On surveillance after completing chemo 05/2022 -Plan for repeat CT scan with Dr. Mosetta Putt in July 2024. -C/w Lipase/Protease/Amylase Capsules 72,000 Units po TID    Prostate Cancer  -On Eligard q6 months   Paroxysmal A-fib (HCC) with RVR  -Initially he was in in normal sinus rhythm but postoperatively he went to A-fib with RVR and continued to have uncontrolled rates today -Continue Telemetry Monitoring -Continue to Hold Apixaban at this time in anticipation for Surgery; resume anticoagulation once okay with orthopedic surgery -C/w Diltiazem 60 mg po BID and had to be placed on a Cardizem drip which was stopped but hesitant to restart given soft BP -Continue with Metoprolol tartrate 5 mg IV every 6 as needed for high blood pressure also added heart rate parameters for heart rate greater than 130 sustained -Had to be placed on a Cardizem drip but now this has been discontinued -Has been in A-fib but not RVR and consulted Cardiology for further evaluation and they feel that the patient's pain is contributing to his RVR and recommending IV metoprolol 5 mg IV as  needed for sustained heart rate and they are considering adding Corlanor as it would not affect his blood pressure   Parkinson's Disease  -C/w Carbidopa-Levodopa 50-200 mg 1 tab po qHS as well as Carbidopa-Levodopa 25-100 mg 3 Tab BID at 0700 and Carbidopa-Levodopa 25-100 mg 2 Tab BID at 1900 -Continue with Clonazepam 0.5  mg p.o. nightly -PT evaluated and recommending SNF  Hyponatremia -Mild. Na+ Trend: Recent Labs  Lab 01/15/23 1304 01/18/23 0953 02/03/23 1519 02/04/23 0135 02/05/23 0317 02/06/23 0145 02/07/23 1124  NA 141 141 137 135 137 132* 129*  -Stopping IVF as above and replete with IV Sodium Phos 15 mmol -Continue to monitor and trend and repeat CMP in a.m.  Hypophosphatemia -Phos Level Trend: Recent Labs  Lab 02/05/23 0317 02/06/23 0145 02/07/23 1124  PHOS 2.6 2.7 2.4*  -Replete with IV Sodium Phos 15 mmol -Continue to Monitor and replete as necessary  Metabolic Acidosis -Mild.  Patient has a CO2 of 20, anion gap of 9, chloride level 100 -Stopping IVF as above -Continue monitor and trend and repeat CMP in the a.m.   GERD/GI Prophylaxis -C/w Pantoprazole 40 mg po Daily   HLD -C/w Rosuvastatin 10 mg po Daily   Hyperbilirubinemia -Likely Reactive. T Bili Trend: Recent Labs  Lab 01/15/23 1304 02/05/23 0317 02/06/23 0145 02/07/23 1124  BILITOT 1.0 1.3* 1.5* 1.5*  -Continue to Monitor and Trend and repeat CMP in the AM  Normocytic Anemia -Hgb/Hct Trend: Recent Labs  Lab 01/15/23 1304 01/18/23 0953 02/03/23 1519 02/04/23 0135 02/05/23 0317 02/06/23 0145 02/07/23 1124  HGB 12.8* 14.8 12.5* 12.0* 12.5* 11.7* 11.3*  HCT 38.6* 46.3 39.8 37.2* 38.8* 35.4* 33.8*  MCV 92.6 93.5 94.3 91.4 91.1 91.7 91.4  -Checked Anemia Panel and showed an iron level of 15, UIBC of 250, TIBC of 265, saturation ratios of 6%, ferritin level 130, folate level 27.2 and vitamin B12 level 846 -Expected Post-operative Drop -Continue to [place] for S/Sx of Bleeding; No overt bleeding noted -Repeat CBC in the AM  Hypoalbuminemia -Patient's Albumin Trend: Recent Labs  Lab 01/15/23 1304 02/05/23 0317 02/06/23 0145 02/07/23 1124  ALBUMIN 4.0 3.2* 3.1* 2.7*  -Continue to Monitor and Trend and repeat CMP in the AM   DVT prophylaxis: SCDs Start: 02/05/23 1725 SCDs Start: 02/03/23  1643 apixaban (ELIQUIS) tablet 5 mg    Code Status: Full Code Family Communication: No family currently at bedside  Disposition Plan:  Level of care: Progressive Status is: Inpatient Remains inpatient appropriate because: Continues to have significant hip pain and heart rates were elevated will need orthopedic surgery and cardiology clearance prior to discharging.   Consultants:  Orthopedic Surgery Cardiology  Procedures:  As delineated as above  Antimicrobials:  Anti-infectives (From admission, onward)    Start     Dose/Rate Route Frequency Ordered Stop   02/05/23 2000  ceFAZolin (ANCEF) IVPB 2g/100 mL premix        2 g 200 mL/hr over 30 Minutes Intravenous Every 6 hours 02/05/23 1724 02/06/23 1114   02/05/23 0630  ceFAZolin (ANCEF) IVPB 2g/100 mL premix        2 g 200 mL/hr over 30 Minutes Intravenous On call to O.R. 02/05/23 0530 02/06/23 1049       Subjective: Seen and examined at bedside and he was complaining of significant pain in his leg.  Did not feel as well today.  No nausea or vomiting.  Denies any chest pain lightheadedness or dizziness but blood pressure is on the lower side.  No other concerns or complaints this time.  Objective: Vitals:   02/08/23 1405 02/08/23 1505 02/08/23 1601 02/08/23 1625  BP: 112/73 94/68 102/79 102/79  Pulse:  83    Resp:  18    Temp:   98.6 F (37 C)   TempSrc:   Oral   SpO2:      Weight:      Height:        Intake/Output Summary (Last 24 hours) at 02/08/2023 1713 Last data filed at 02/08/2023 0957 Gross per 24 hour  Intake 453.65 ml  Output 950 ml  Net -496.35 ml   Filed Weights   02/05/23 1309  Weight: 68.9 kg   Examination: Physical Exam:  Constitutional: Thin elderly chronically ill-appearing Caucasian male who appears uncomfortable and continues to be quite a bit of pain Respiratory: Diminished to auscultation bilaterally, no wheezing, rales, rhonchi or crackles. Normal respiratory effort and patient is not  tachypenic. No accessory muscle use.  Breathing Cardiovascular: Irregularly irregular and a little tachycardic, no murmurs / rubs / gallops. S1 and S2 auscultated.  No appreciable extremity edema. Abdomen: Soft, non-tender, non-distended.  Bowel sounds positive.  GU: Deferred. Musculoskeletal: No clubbing / cyanosis of digits/nails. No joint deformity upper and lower extremities.  Skin: No rashes, lesions, ulcers. No induration; Warm and dry.  Neurologic: CN 2-12 grossly intact with no focal deficits.  Having some leg tremors on the right leg again not as pronounced Psychiatric: Normal judgment and insight. Alert and oriented x 3.  Anxious mood and appropriate affect.   Data Reviewed: I have personally reviewed following labs and imaging studies  CBC: Recent Labs  Lab 02/03/23 1519 02/04/23 0135 02/05/23 0317 02/06/23 0145 02/07/23 1124 02/08/23 0934  WBC 5.8 7.7 6.8 7.1 10.5 8.3  NEUTROABS 4.0  --  4.6 5.7 8.2* 5.9  HGB 12.5* 12.0* 12.5* 11.7* 11.3* 11.5*  HCT 39.8 37.2* 38.8* 35.4* 33.8* 35.0*  MCV 94.3 91.4 91.1 91.7 91.4 90.2  PLT 180 187 168 172 178 211   Basic Metabolic Panel: Recent Labs  Lab 02/04/23 0135 02/05/23 0317 02/06/23 0145 02/07/23 1124 02/08/23 0934  NA 135 137 132* 129* 133*  K 4.0 4.1 3.9 4.4 3.9  CL 102 103 102 100 103  CO2 24 23 21* 20* 21*  GLUCOSE 140* 114* 148* 123* 112*  BUN 16 18 13 18 20   CREATININE 1.11 1.12 1.00 1.17 1.09  CALCIUM 9.1 8.8* 8.3* 8.3* 8.4*  MG  --  2.1 1.8 2.2 2.4  PHOS  --  2.6 2.7 2.4* 3.1   GFR: Estimated Creatinine Clearance: 46.2 mL/min (by C-G formula based on SCr of 1.09 mg/dL). Liver Function Tests: Recent Labs  Lab 02/05/23 0317 02/06/23 0145 02/07/23 1124 02/08/23 0934  AST 22 27 32 74*  ALT 6 6 6 8   ALKPHOS 47 38 42 52  BILITOT 1.3* 1.5* 1.5* 1.4*  PROT 6.1* 5.8* 5.8* 5.9*  ALBUMIN 3.2* 3.1* 2.7* 2.6*   No results for input(s): "LIPASE", "AMYLASE" in the last 168 hours. No results for input(s):  "AMMONIA" in the last 168 hours. Coagulation Profile: Recent Labs  Lab 02/03/23 1519  INR 1.2   Cardiac Enzymes: No results for input(s): "CKTOTAL", "CKMB", "CKMBINDEX", "TROPONINI" in the last 168 hours. BNP (last 3 results) No results for input(s): "PROBNP" in the last 8760 hours. HbA1C: No results for input(s): "HGBA1C" in the last 72 hours. CBG: No results for input(s): "GLUCAP" in the last 168 hours. Lipid Profile: No results for input(s): "  CHOL", "HDL", "LDLCALC", "TRIG", "CHOLHDL", "LDLDIRECT" in the last 72 hours. Thyroid Function Tests: No results for input(s): "TSH", "T4TOTAL", "FREET4", "T3FREE", "THYROIDAB" in the last 72 hours. Anemia Panel: Recent Labs    02/06/23 0145  VITAMINB12 846  FOLATE 27.2  FERRITIN 138  TIBC 265  IRON 15*  RETICCTPCT 1.3   Sepsis Labs: No results for input(s): "PROCALCITON", "LATICACIDVEN" in the last 168 hours.  Recent Results (from the past 240 hour(s))  MRSA Next Gen by PCR, Nasal     Status: None   Collection Time: 02/04/23  5:57 AM   Specimen: Nasal Mucosa; Nasal Swab  Result Value Ref Range Status   MRSA by PCR Next Gen NOT DETECTED NOT DETECTED Final    Comment: (NOTE) The GeneXpert MRSA Assay (FDA approved for NASAL specimens only), is one component of a comprehensive MRSA colonization surveillance program. It is not intended to diagnose MRSA infection nor to guide or monitor treatment for MRSA infections. Test performance is not FDA approved in patients less than 49 years old. Performed at [facility], [address] [place], [place] [address]     Radiology Studies: No results found.  Scheduled Meds:  apixaban  5 mg Oral BID   carbidopa-levodopa  1 tablet Oral QHS   carbidopa-levodopa  3 tablet Oral BID   And   carbidopa-levodopa  2 tablet Oral BID   Chlorhexidine Gluconate Cloth  6 each Topical Daily   clonazePAM  0.5 mg Oral QHS   cyanocobalamin  1,000 mcg Oral Daily   diltiazem  60 mg Oral BID    lipase/protease/amylase  72,000 Units Oral TID with meals   pantoprazole  40 mg Oral Daily   polyethylene glycol  17 g Oral BID   rosuvastatin  10 mg Oral Daily   senna-docusate  1 tablet Oral BID   Continuous Infusions:  sodium chloride 10 mL/hr at 02/08/23 0700    LOS: 5 days   Marguerita Merles, DO [facility] Available via Epic secure chat 7am-7pm After these hours, please refer to coverage provider listed on [email or web address] 02/08/2023, 5:13 PM

## 2023-02-08 NOTE — Progress Notes (Signed)
Mobility Specialist Progress Note:    02/08/23 1558  Mobility  Activity Ambulated with assistance in hallway;Transferred from chair to bed  Level of Assistance Minimal assist, patient does 75% or more (+2)  Assistive Device Stedy  Activity Response Tolerated well  Mobility Referral Yes  $Mobility charge 1 Mobility  Mobility Specialist Start Time (ACUTE ONLY) 1520  Mobility Specialist Stop Time (ACUTE ONLY) 1529  Mobility Specialist Time Calculation (min) (ACUTE ONLY) 9 min   Pt received in chair requesting to get back in bed. Pt c/o pain throughout session. Pt readjusted in bed with call bell in hand.   Thompson Grayer Mobility Specialist  Please contact vis Secure Chat or  Rehab Office [phone]

## 2023-02-09 DIAGNOSIS — E871 Hypo-osmolality and hyponatremia: Secondary | ICD-10-CM | POA: Diagnosis not present

## 2023-02-09 DIAGNOSIS — I700 Atherosclerosis of aorta: Secondary | ICD-10-CM | POA: Diagnosis not present

## 2023-02-09 DIAGNOSIS — Z7901 Long term (current) use of anticoagulants: Secondary | ICD-10-CM | POA: Diagnosis not present

## 2023-02-09 DIAGNOSIS — D649 Anemia, unspecified: Secondary | ICD-10-CM | POA: Diagnosis not present

## 2023-02-09 DIAGNOSIS — I480 Paroxysmal atrial fibrillation: Secondary | ICD-10-CM | POA: Diagnosis not present

## 2023-02-09 DIAGNOSIS — G20A1 Parkinson's disease without dyskinesia, without mention of fluctuations: Secondary | ICD-10-CM | POA: Diagnosis not present

## 2023-02-09 DIAGNOSIS — Z96641 Presence of right artificial hip joint: Secondary | ICD-10-CM | POA: Diagnosis not present

## 2023-02-09 DIAGNOSIS — G8911 Acute pain due to trauma: Secondary | ICD-10-CM | POA: Diagnosis not present

## 2023-02-09 DIAGNOSIS — S72001A Fracture of unspecified part of neck of right femur, initial encounter for closed fracture: Secondary | ICD-10-CM | POA: Diagnosis not present

## 2023-02-09 DIAGNOSIS — S72041A Displaced fracture of base of neck of right femur, initial encounter for closed fracture: Secondary | ICD-10-CM | POA: Diagnosis not present

## 2023-02-09 DIAGNOSIS — C7951 Secondary malignant neoplasm of bone: Secondary | ICD-10-CM | POA: Diagnosis not present

## 2023-02-09 DIAGNOSIS — N400 Enlarged prostate without lower urinary tract symptoms: Secondary | ICD-10-CM | POA: Diagnosis not present

## 2023-02-09 DIAGNOSIS — C259 Malignant neoplasm of pancreas, unspecified: Secondary | ICD-10-CM | POA: Diagnosis not present

## 2023-02-09 DIAGNOSIS — Z7401 Bed confinement status: Secondary | ICD-10-CM | POA: Diagnosis not present

## 2023-02-09 DIAGNOSIS — Z905 Acquired absence of kidney: Secondary | ICD-10-CM | POA: Diagnosis not present

## 2023-02-09 DIAGNOSIS — S72001D Fracture of unspecified part of neck of right femur, subsequent encounter for closed fracture with routine healing: Secondary | ICD-10-CM | POA: Diagnosis not present

## 2023-02-09 DIAGNOSIS — C61 Malignant neoplasm of prostate: Secondary | ICD-10-CM | POA: Diagnosis not present

## 2023-02-09 DIAGNOSIS — W19XXXD Unspecified fall, subsequent encounter: Secondary | ICD-10-CM | POA: Diagnosis not present

## 2023-02-09 DIAGNOSIS — E538 Deficiency of other specified B group vitamins: Secondary | ICD-10-CM | POA: Diagnosis not present

## 2023-02-09 DIAGNOSIS — G4730 Sleep apnea, unspecified: Secondary | ICD-10-CM | POA: Diagnosis not present

## 2023-02-09 LAB — COMPREHENSIVE METABOLIC PANEL
ALT: 11 U/L (ref 0–44)
AST: 141 U/L — ABNORMAL HIGH (ref 15–41)
Albumin: 2.5 g/dL — ABNORMAL LOW (ref 3.5–5.0)
Alkaline Phosphatase: 71 U/L (ref 38–126)
Anion gap: 5 (ref 5–15)
BUN: 20 mg/dL (ref 8–23)
CO2: 24 mmol/L (ref 22–32)
Calcium: 8.6 mg/dL — ABNORMAL LOW (ref 8.9–10.3)
Chloride: 106 mmol/L (ref 98–111)
Creatinine, Ser: 1.11 mg/dL (ref 0.61–1.24)
GFR, Estimated: >60 mL/min (ref >60)
Glucose, Bld: 128 mg/dL — ABNORMAL HIGH (ref 70–99)
Potassium: 3.8 mmol/L (ref 3.5–5.1)
Sodium: 135 mmol/L (ref 135–145)
Total Bilirubin: 1.3 mg/dL — ABNORMAL HIGH (ref 0.3–1.2)
Total Protein: 6 g/dL — ABNORMAL LOW (ref 6.5–8.1)

## 2023-02-09 LAB — CBC WITH DIFFERENTIAL/PLATELET
Abs Immature Granulocytes: 0.07 10*3/uL (ref 0.00–0.07)
Basophils Absolute: 0.1 10*3/uL (ref 0.0–0.1)
Basophils Relative: 1 %
Eosinophils Absolute: 0.3 10*3/uL (ref 0.0–0.5)
Eosinophils Relative: 4 %
HCT: 33.9 % — ABNORMAL LOW (ref 39.0–52.0)
Hemoglobin: 11.3 g/dL — ABNORMAL LOW (ref 13.0–17.0)
Immature Granulocytes: 1 %
Lymphocytes Relative: 11 %
Lymphs Abs: 0.9 10*3/uL (ref 0.7–4.0)
MCH: 30.6 pg (ref 26.0–34.0)
MCHC: 33.3 g/dL (ref 30.0–36.0)
MCV: 91.9 fL (ref 80.0–100.0)
Monocytes Absolute: 0.7 10*3/uL (ref 0.1–1.0)
Monocytes Relative: 8 %
Neutro Abs: 6.2 10*3/uL (ref 1.7–7.7)
Neutrophils Relative %: 75 %
Platelets: 238 10*3/uL (ref 150–400)
RBC: 3.69 MIL/uL — ABNORMAL LOW (ref 4.22–5.81)
RDW: 13.4 % (ref 11.5–15.5)
WBC: 8.2 10*3/uL (ref 4.0–10.5)
nRBC: 0 % (ref 0.0–0.2)

## 2023-02-09 LAB — MAGNESIUM: Magnesium: 2.5 mg/dL — ABNORMAL HIGH (ref 1.7–2.4)

## 2023-02-09 LAB — PHOSPHORUS: Phosphorus: 2.6 mg/dL (ref 2.5–4.6)

## 2023-02-09 MED ORDER — SENNOSIDES-DOCUSATE SODIUM 8.6-50 MG PO TABS: 1.0000 | ORAL_TABLET | Freq: Every day | ORAL | Status: DC

## 2023-02-09 MED ORDER — ONDANSETRON HCL 4 MG PO TABS: 4.0000 mg | ORAL_TABLET | Freq: Four times a day (QID) | ORAL | 0 refills | Status: DC | PRN

## 2023-02-09 MED ORDER — OXYCODONE HCL 5 MG PO TABS: 2.5000 mg | ORAL_TABLET | Freq: Four times a day (QID) | ORAL | 0 refills | Status: DC | PRN

## 2023-02-09 MED ORDER — BISACODYL 10 MG RE SUPP: 10.0000 mg | Freq: Every day | RECTAL | 0 refills | Status: DC | PRN

## 2023-02-09 MED ORDER — TIZANIDINE HCL 2 MG PO TABS: 2.0000 mg | ORAL_TABLET | Freq: Three times a day (TID) | ORAL | 0 refills | Status: DC | PRN

## 2023-02-09 MED ORDER — DILTIAZEM HCL ER 90 MG PO CP12: 90.0000 mg | ORAL_CAPSULE | Freq: Two times a day (BID) | ORAL | 0 refills | Status: DC

## 2023-02-09 MED ORDER — CLONAZEPAM 0.5 MG PO TABS: 0.5000 mg | ORAL_TABLET | Freq: Every day | ORAL | 0 refills | Status: DC

## 2023-02-09 NOTE — TOC Transition Note (Signed)
Transition of Care [facility]) - CM/SW Discharge Note   Patient Details  Name: [name] [name] MRN: [id number] Date of Birth: 1941/07/05  [facility]) CM/SW Contact:  Erin Sons, LCSW Phone Number: 02/09/2023, 2:04 PM   Clinical Narrative:     Patient will DC to: [facility] Anticipated DC date: 02/09/23 Family notified: Son Sherrill Raring Transport by: Sharin Mons   Per MD patient ready for DC to [facility]. RN, patient, patient's family, and facility notified of DC. Discharge Summary and FL2 sent to facility. RN to call report prior to discharge [phone] ). DC packet on chart. Ambulance transport requested for patient.   CSW will sign off for now as social work intervention is no longer needed. Please consult [place] again if new needs arise.   Final next level of care: Skilled Nursing Facility Barriers to Discharge: No Barriers Identified   Patient Goals and CMS Choice CMS Medicare.gov Compare Post Acute Care list provided to:: Patient Choice offered to / list presented to : Patient  Discharge Placement                Patient chooses bed at: [facility] Patient to be transferred to facility by: [facility] Name of family member notified: Duanne Moron Patient and family notified of of transfer: 02/09/23  Discharge Plan and Services Additional resources added to the After Visit Summary for   In-house Referral: Clinical Social Work                                   Social Determinants of Health (SDOH) Interventions SDOH Screenings   Depression (PHQ2-9): Low Risk  (12/23/2020)  Recent Concern: Depression (PHQ2-9) - Medium Risk (11/26/2020)  Tobacco Use: Medium Risk (02/08/2023)     Readmission Risk Interventions     No data to display

## 2023-02-09 NOTE — Progress Notes (Signed)
Report called to Charge nurse. No further questions at this time. PIV removed. Prior to discharge.

## 2023-02-09 NOTE — Progress Notes (Signed)
Subjective: 4 Days Post-Op Procedure(s) (LRB): ANTERIOR APPROACH HEMI HIP ARTHROPLASTY (Right) Patient reports pain as mild.    Objective: Vital signs in last 24 hours: Temp:  [97.6 F (36.4 C)-98.6 F (37 C)] 97.8 F (36.6 C) (06/04 0351) Pulse Rate:  [80-100] 80 (06/04 0351) Resp:  [15-36] 19 (06/04 0351) BP: (91-130)/(57-79) 91/65 (06/04 0351) SpO2:  [91 %-98 %] 97 % (06/04 0351)  Intake/Output from previous day: 06/03 0701 - 06/04 0700 In: 52.3 [I.V.:52.3] Out: 500 [Urine:500] Intake/Output this shift: No intake/output data recorded.  Recent Labs    02/07/23 1124 02/08/23 0934  HGB 11.3* 11.5*   Recent Labs    02/07/23 1124 02/08/23 0934  WBC 10.5 8.3  RBC 3.70* 3.88*  HCT 33.8* 35.0*  PLT 178 211   Recent Labs    02/07/23 1124 02/08/23 0934  NA 129* 133*  K 4.4 3.9  CL 100 103  CO2 20* 21*  BUN 18 20  CREATININE 1.17 1.09  GLUCOSE 123* 112*  CALCIUM 8.3* 8.4*   No results for input(s): "LABPT", "INR" in the last 72 hours.  Neurologically intact ABD soft Neurovascular intact Sensation intact distally Intact pulses distally Dorsiflexion/Plantar flexion intact No cellulitis present Compartment soft   Assessment/Plan: 4 Days Post-Op Procedure(s) (LRB): ANTERIOR APPROACH HEMI HIP ARTHROPLASTY (Right) Advance diet Up with therapy Discharge to SNF today if bed available would be appropriate.      [name] [name] 02/09/2023, 8:23 AM

## 2023-02-09 NOTE — Plan of Care (Signed)

## 2023-02-09 NOTE — Progress Notes (Signed)
Occupational Therapy Treatment Patient Details Name: [name] [name] MRN: [id number] DOB: [birth year] Today's Date: 02/09/2023   History of present illness [name] [name] is a 82 y.o. male admitted 5/31 after fall. Sustained right femoral neck fracture. Underwent right hemiarthroplasty on 5/31.  NWG:NFAOZHYQ cancer, pancreatic cancer, paroxysmal atrial fibrillation on Eliquis, Parkinsons   OT comments  Pt making slow progression towards goals this session, needing max A +2 for toilet transfer, mod A +2 for bed mobility. Pt rates pain 8/10 during session, able to complete seated LB therex in chair to improve ROM for LB ADLs. Pt presenting with impairments listed below, will follow acutely. Patient will benefit from continued inpatient follow up therapy, <3 hours/day to maximize safety/ind with ADLs and functional mobility.    Recommendations for follow up therapy are one component of a multi-disciplinary discharge planning process, led by the attending physician.  Recommendations may be updated based on patient status, additional functional criteria and insurance authorization.    Assistance Recommended at Discharge Frequent or constant Supervision/Assistance  Patient can return home with the following  Two people to help with walking and/or transfers;A lot of help with bathing/dressing/bathroom;Assistance with cooking/housework;Direct supervision/assist for medications management;Direct supervision/assist for financial management;Assist for transportation;Help with stairs or ramp for entrance   Equipment Recommendations  Other (comment) (defer)    Recommendations for Other Services PT consult    Precautions / Restrictions Precautions Precautions: Fall Precaution Comments: No hip precautions per MD Restrictions Weight Bearing Restrictions: Yes RLE Weight Bearing: Weight bearing as tolerated       Mobility Bed Mobility Overal bed mobility: Needs Assistance Bed Mobility: Supine to  Sit     Supine to sit: Mod assist, +2 for physical assistance     General bed mobility comments: Assist to bring legs off of bed, elevate trunk into sitting, and bring hips to EOB.    Transfers Overall transfer level: Needs assistance Equipment used: Rolling walker (2 wheels) Transfers: Bed to chair/wheelchair/BSC, Sit to/from Stand Sit to Stand: Max assist, +2 physical assistance                 Balance Overall balance assessment: Needs assistance Sitting-balance support: Bilateral upper extremity supported, Feet supported Sitting balance-Leahy Scale: Fair     Standing balance support: Bilateral upper extremity supported, Reliant on assistive device for balance Standing balance-Leahy Scale: Poor Standing balance comment: reliant on external support                           ADL either performed or assessed with clinical judgement   ADL Overall ADL's : Needs assistance/impaired                     Lower Body Dressing: Maximal assistance;Bed level;Sitting/lateral leans   Toilet Transfer: +2 for physical assistance;Rolling walker (2 wheels);Regular Toilet;Stand-pivot;Maximal assistance           Functional mobility during ADLs: Maximal assistance;+2 for physical assistance      Extremity/Trunk Assessment Upper Extremity Assessment Upper Extremity Assessment: Generalized weakness   Lower Extremity Assessment Lower Extremity Assessment: Defer to PT evaluation        Vision   Vision Assessment?: No apparent visual deficits   Perception Perception Perception: Not tested   Praxis Praxis Praxis: Not tested    Cognition Arousal/Alertness: Awake/alert Behavior During Therapy: WFL for tasks assessed/performed Overall Cognitive Status: Within Functional Limits for tasks assessed  Exercises Total Joint Exercises Heel Slides: AAROM, Both, 5 reps, Seated    Shoulder Instructions        General Comments VSS on RA, sons present    Pertinent Vitals/ Pain       Pain Assessment Pain Assessment: Faces Pain Score: 6  Faces Pain Scale: Hurts even more Pain Location: right hip Pain Descriptors / Indicators: Grimacing, Guarding, Aching Pain Intervention(s): Limited activity within patient's tolerance, Monitored during session, Repositioned  Home Living Family/patient expects to be discharged to:: Private residence Living Arrangements: Alone Available Help at Discharge: Available PRN/intermittently Type of Home: Independent living facility                                  Prior Functioning/Environment              Frequency  Min 1X/week        Progress Toward Goals  OT Goals(current goals can now be found in the care plan section)  Progress towards OT goals: Progressing toward goals  Acute Rehab OT Goals Patient Stated Goal: none stated OT Goal Formulation: With patient Time For Goal Achievement: 02/21/23 Potential to Achieve Goals: Good ADL Goals Pt Will Perform Upper Body Dressing: with min assist;sitting Pt Will Perform Lower Body Dressing: with min assist;sitting/lateral leans;sit to/from stand;with adaptive equipment Pt Will Transfer to Toilet: with min assist;squat pivot transfer;stand pivot transfer;bedside commode  Plan Discharge plan remains appropriate;Frequency remains appropriate    Co-evaluation                 AM-PAC OT "6 Clicks" Daily Activity     Outcome Measure   Help from another person eating meals?: A Little Help from another person taking care of personal grooming?: A Little Help from another person toileting, which includes using toliet, bedpan, or urinal?: A Lot Help from another person bathing (including washing, rinsing, drying)?: A Lot Help from another person to put on and taking off regular upper body clothing?: A Lot Help from another person to put on and taking off regular lower body  clothing?: Total 6 Click Score: 13    End of Session Equipment Utilized During Treatment: Gait belt;Rolling walker (2 wheels)  OT Visit Diagnosis: Unsteadiness on feet (R26.81);Other abnormalities of gait and mobility (R26.89);Muscle weakness (generalized) (M62.81)   Activity Tolerance Patient tolerated treatment well   Patient Left in chair;with call bell/phone within reach;with chair alarm set   Nurse Communication Mobility status        Time: 4742-5956 OT Time Calculation (min): 22 min  Charges: OT General Charges $OT Visit: 1 Visit OT Treatments $Therapeutic Activity: 8-22 mins  Carver Fila, OTD, OTR/L SecureChat Preferred Acute Rehab [phone]   Tahja Liao K Koonce 02/09/2023, 1:06 PM

## 2023-02-09 NOTE — Care Management Important Message (Signed)
Important Message  Patient Details  Name: [name] [name] MRN: [id number] Date of Birth: 1941/02/08   Medicare Important Message Given:  Yes     Renie Ora 02/09/2023, 10:36 AM

## 2023-02-09 NOTE — Progress Notes (Signed)
Okay to discharge today in diltiazem 90 mg bid

## 2023-02-09 NOTE — Discharge Summary (Addendum)
Physician Discharge Summary   Patient: [name] [name] MRN: [id number] DOB: Mar 07, 1941  Admit date:     02/03/2023  Discharge date: 02/09/23  Discharge Physician: Marguerita Merles, DO   PCP: Lorenda Ishihara, MD   Recommendations at discharge:   Follow-up with PCP within 1 to 2 weeks and repeat CBC, CMP, mag, Phos within 1 week Follow-up with neurology in outpatient setting Follow-up with cardiology in outpatient setting within 1 to 2 weeks and appointment set up with Dr. Odis Hollingshead on 02/22/23 at 4:00 pm Follow-up with orthopedic surgery within 1 to 2 weeks and continue weightbearing status per orthopedic surgery  Discharge Diagnoses: Principal Problem:   Displaced fracture of right femoral neck (HCC) Active Problems:   Parkinson's disease   Pancreatic cancer (HCC)   A-fib (HCC)   Femur fracture, right (HCC)  Resolved Problems:   * No resolved hospital problems. [facility] Course: The patient is an 81 year old elderly Caucasian male with a past medical history significant for but limited to prostate cancer, history of pancreatic cancer, paroxysmal atrial fibrillation on anticoagulation with Eliquis, Parkinson disease, sleep apnea as well as other comorbidities including BPH who presented to the hospital with a traumatic right hip fracture.  Patient was seen in the usual state of health yesterday morning while he was carrying a tray when he suddenly slipped on his wooden floor with his socks and fell on his right hip.  He denies any loss of consciousness and did not hit his head.  Given his fall and right hip pain EMS was called and he was brought to the emergency department and x-ray was done which confirmed a displaced closed right femoral neck fracture.    Orthopedic Surgery was consulted and hospitalist admitted this patient. Surgery was done on 02/05/2023.  Postoperatively the patient went into atrial fibrillation with RVR had to be placed on a Cardizem drip to be transferred to the  cardiac floor.  Intermittently tachycardic so getting IV metoprolol as well.  PT evaluated and recommending SNF and now his anticoagulation has been resumed.  Cardiology was consulted to assist with HR given RVR and increased to 90 mg po BID. His heart rate is improved with adjustments and pain is fairly well-controlled.  He is now deemed medically stable for discharge at this time will need to follow-up with PCP, cardiology and orthopedic surgery in the outpatient setting within 1 to 2 weeks.  Assessment and Plan:   Displaced Fracture of Right Femoral Neck (HCC) status post right bipolar hemiarthroplasty POD4 -Inpatient admission to Medical Telemetry -Patient had a mechanical fall and slipped on his wooden floor with his socks while he was carrying a tray -DG Hip done and showed "Acute displaced fracture is seen in the neck of proximal right femur." -Continue Pain control with Acetaminophen 650 mg po/RC q6hprn Mild Pain or Fever >/= 101, Oxycodone IR 2.5-5 mg po q6hprn Severe Pain, and IV Hydromorphone 0.5 mg q4hprn Severe Pain; Having quite a bit of pain  -She had received Tranexamic Acid 1000 mg  -Bowel Regimen with po Docusate 100 mg po BID and Miralax 17 grams po Dailyprn -Initially was placed on VTE prophylaxis with enoxaparin 40 mg subcu every 24 for now and this has not been transitioned back to his home anticoagulant with apixaban 5 mg p.o. twice daily -IVF now discontinued  -Patient had surgical intervention on 02/05/2023 -PT OT evaluated and recommending SNF and Ortho recommends weightbearing as tolerated THA soft use walker and therapist must be present -They  are recommending SCDs for 72 hours and continuing Eliquis twice daily   Hx of Pancreatic Cancer (HCC)  -On surveillance after completing chemo 05/2022 -Plan for repeat CT scan with Dr. Mosetta Putt in July 2024. -C/w Lipase/Protease/Amylase Capsules 72,000 Units po TID    Prostate Cancer  -On Eligard q6 months   Paroxysmal A-fib  (HCC) with RVR  -Initially he was in in normal sinus rhythm but postoperatively he went to A-fib with RVR and continued to have uncontrolled rates today -Continue Telemetry Monitoring -Continue to Hold Apixaban at this time in anticipation for Surgery; resume anticoagulation once okay with orthopedic surgery -C/w Diltiazem 60 mg po BID and had to be placed on a Cardizem drip which was stopped but hesitant to restart given soft BP; Cardiology increased Diltiazem from 60 mg po BID to 90 mg po BID but was held this AM due to low/normal BP and this was reduced back down to 60 mg p.o. twice daily -Continue with Metoprolol tartrate 5 mg IV every 6 as needed for high blood pressure also added heart rate parameters for heart rate greater than 130 sustained -Had to be placed on a Cardizem drip but now this has been discontinued -Has been in A-fib but not RVR and consulted Cardiology for further evaluation and they feel that the patient's pain is contributing to his RVR and recommending IV metoprolol 5 mg IV as needed for sustained heart rate and they are considering adding Corlanor as it would not affect his blood pressure -Cardiology feels that as long as his A-fib rate stays less than 110 bpm there is no further changes in his current management -His HR improved and he is stable   Parkinson's Disease  -C/w Carbidopa-Levodopa 50-200 mg 1 tab po qHS as well as Carbidopa-Levodopa 25-100 mg 3 Tab BID at 0700 and Carbidopa-Levodopa 25-100 mg 2 Tab BID at 1900 -Continue with Clonazepam 0.5 mg p.o. nightly -PT evaluated and recommending SNF  Hyponatremia -Mild. Na+ Trend: Recent Labs  Lab 02/03/23 1519 02/04/23 0135 02/05/23 0317 02/06/23 0145 02/07/23 1124 02/08/23 0934 02/09/23 0927  NA 137 135 137 132* 129* 133* 135  -Continue to monitor and trend and repeat CMP within 1 week  Hypophosphatemia, improved  -Phos Level Trend: Recent Labs  Lab 02/05/23 0317 02/06/23 0145 02/07/23 1124  02/08/23 0934 02/09/23 0927  PHOS 2.6 2.7 2.4* 3.1 2.6  -Replete with IV Sodium Phos 15 mmol yesterday -Continue to Monitor and replete as necessary  Metabolic Acidosis, improved -Mild.  Patient has a CO2 of 24, anion gap of 5, chloride level 106 -Stopping IVF as above -Continue monitor and trend and repeat CMP in the a.m.   GERD/GI Prophylaxis -C/w Pantoprazole 40 mg po Daily   HLD -C/w Rosuvastatin 10 mg po Daily   Elevated AST -Mild and Likely Reactive and could be medication induced -AST and ALT trend: Recent Labs  Lab 01/15/23 1304 02/05/23 0317 02/05/23 0317 02/06/23 0145 02/07/23 1124 02/08/23 0934 02/09/23 0927  AST 23 22  --  27 32 74* 141*  ALT <5 6   < > 6 6 8 11    < > = values in this interval not displayed.  -Continue to Monitor and Trend and repeat CMP within 1 week and if still remains elevated, recommend Acute Hepatitis Panel and RUQ U/S for further workup  Hyperbilirubinemia -Likely Reactive. T Bili Trend: Recent Labs  Lab 01/15/23 1304 02/05/23 0317 02/06/23 0145 02/07/23 1124 02/08/23 0934 02/09/23 0927  BILITOT 1.0 1.3* 1.5* 1.5* 1.4*  1.3*  -Continue to Monitor and Trend and repeat CMP within 1 week  Normocytic Anemia -Hgb/Hct Trend: Recent Labs  Lab 02/03/23 1519 02/04/23 0135 02/05/23 0317 02/06/23 0145 02/07/23 1124 02/08/23 0934 02/09/23 0927  HGB 12.5* 12.0* 12.5* 11.7* 11.3* 11.5* 11.3*  HCT 39.8 37.2* 38.8* 35.4* 33.8* 35.0* 33.9*  MCV 94.3 91.4 91.1 91.7 91.4 90.2 91.9  -Checked Anemia Panel and showed an iron level of 15, UIBC of 250, TIBC of 265, saturation ratios of 6%, ferritin level 130, folate level 27.2 and vitamin B12 level 846 -Expected Post-operative Drop -Continue to Montior for S/Sx of Bleeding; No overt bleeding noted -Repeat CBC within 1 week  Hypoalbuminemia -Patient's Albumin Trend: Recent Labs  Lab 01/15/23 1304 02/05/23 0317 02/06/23 0145 02/07/23 1124 02/08/23 0934 02/09/23 0927  ALBUMIN 4.0  3.2* 3.1* 2.7* 2.6* 2.5*  -Continue to Monitor and Trend and repeat CMP within 1 week  Consultants: Orthopedic Surgery, Cardiology Procedures performed: As delineated as above  Disposition: Skilled nursing facility Diet recommendation:  Cardiac diet DISCHARGE MEDICATION: Allergies as of 02/09/2023       Reactions   Influenza Vaccines Anaphylaxis   Flu shot: 1974 anaphylaxis. 2nd time swollen arm   Influenza Virus Vaccine Other (See Comments)        Medication List     TAKE these medications    acetaminophen 500 MG tablet Commonly known as: TYLENOL Take 1 tablet (500 mg total) by mouth every 6 (six) hours as needed for moderate pain or mild pain.   apixaban 5 MG Tabs tablet Commonly known as: ELIQUIS Take 1 tablet (5 mg total) by mouth 2 (two) times daily.   bisacodyl 10 MG suppository Commonly known as: DULCOLAX Place 1 suppository (10 mg total) rectally daily as needed for moderate constipation.   carbidopa-levodopa 50-200 MG tablet Commonly known as: SINEMET CR Take 1 tablet by mouth at bedtime.   carbidopa-levodopa 25-100 MG tablet Commonly known as: SINEMET IR Take 3 tablets at 7 AM, 2 tablets at 11 AM, 3 tablets at 3 PM, 2 tablets at 7 PM   clonazePAM 0.5 MG tablet Commonly known as: KLONOPIN Take 1 tablet (0.5 mg total) by mouth at bedtime.   cyanocobalamin 1000 MCG tablet Commonly known as: VITAMIN B12 Take 1,000 mcg by mouth daily.   diltiazem 60 MG 12 hr capsule Commonly known as: CARDIZEM SR Take 1 capsule (60 mg total) by mouth 2 (two) times daily. Hold if systolic blood pressure (top number) less than 110 mmHg or pulse less than 55 bpm.   ketoconazole 2 % shampoo Commonly known as: NIZORAL Apply 1 application  topically 3 (three) times a week.   leuprolide (6 Month) 45 MG injection Commonly known as: ELIGARD Inject 45 mg into the skin every 6 (six) months.   MiraLax 17 GM/SCOOP powder Generic drug: polyethylene glycol powder Take 17 g by  mouth in the morning and at bedtime.   omeprazole 20 MG capsule Commonly known as: PRILOSEC TAKE 1 CAPSULE DAILY   ondansetron 4 MG tablet Commonly known as: ZOFRAN Take 1 tablet (4 mg total) by mouth every 6 (six) hours as needed for nausea. What changed:  medication strength how much to take when to take this reasons to take this   oxyCODONE 5 MG immediate release tablet Commonly known as: Oxy IR/ROXICODONE Take 0.5-1 tablets (2.5-5 mg total) by mouth every 6 (six) hours as needed for severe pain. What changed: when to take this   Pancreaze 37000-97300 units Cpep Generic  drug: Pancrelipase (Lip-Prot-Amyl) Take 2 capsules (74,000 Units total) by mouth with breakfast, with lunch, and with evening meal. Take 2 capsules with each meal and 1 with snack   prochlorperazine 10 MG tablet Commonly known as: COMPAZINE Take 1 tablet (10 mg total) by mouth every 6 (six) hours as needed (Nausea or vomiting). What changed: reasons to take this   rosuvastatin 10 MG tablet Commonly known as: CRESTOR TAKE 1 TABLET DAILY   senna-docusate 8.6-50 MG tablet Commonly known as: Senokot-S Take 1 tablet by mouth at bedtime.   tiZANidine 2 MG tablet Commonly known as: ZANAFLEX Take 1 tablet (2 mg total) by mouth every 8 (eight) hours as needed for muscle spasms. What changed:  when to take this reasons to take this   triamcinolone cream 0.1 % Commonly known as: KENALOG Apply 1 application topically 2 (two) times daily. What changed:  when to take this reasons to take this   VISINE DRY EYE OP Place 1 drop into both eyes 2 (two) times daily as needed (eye irritation).   zoledronic acid 5 MG/100ML Soln injection Commonly known as: RECLAST Inject 5 mg into the vein See admin instructions. Once a year        Follow-up Information     Jodi Geralds, MD. Schedule an appointment as soon as possible for a visit in 2 week(s).   Specialty: Orthopedic Surgery Contact information: [address] [place] [address] [phone]         Jodi Geralds, MD Follow up in 2 week(s).   Specialty: Orthopedic Surgery Why: pt wbat and no restrictions to bed mobility.  Anu issues call or be seen earlier Contact information: [address] [place] [address] [phone]                Discharge Exam: Ceasar Mons Weights   02/05/23 1309  Weight: 68.9 kg   Vitals:   02/09/23 0351 02/09/23 0825  BP: 91/65 93/70  Pulse: 80 97  Resp: 19 16  Temp: 97.8 F (36.6 C)   SpO2: 97% 95%   Examination: Physical Exam:  Constitutional: Thin elderly chronically ill-appearing Caucasian male who appears more comfortable today and in not as much acute distress Respiratory: Diminished to auscultation bilaterally, no wheezing, rales, rhonchi or crackles. Normal respiratory effort and patient is not tachypenic. No accessory muscle use.  Unlabored breathing Cardiovascular: Irregularly irregular but rate controlled, no murmurs / rubs / gallops. S1 and S2 auscultated. No extremity edema.  Abdomen: Soft, non-tender, non-distended.Bowel sounds positive.  GU: Deferred. Musculoskeletal: No clubbing / cyanosis of digits/nails. No joint deformity upper and lower extremities.  Skin: No rashes, lesions, ulcers on a limited skin evaluation. No induration; Warm and dry.  Neurologic: CN 2-12 grossly intact with no focal deficits.  Not having leg tremors today.  Romberg sign and cerebellar reflexes not assessed.  Psychiatric: Normal judgment and insight. Alert and oriented x 3. Normal mood and appropriate affect.   Condition at discharge: stable  The results of significant diagnostics from this hospitalization (including imaging, microbiology, ancillary and laboratory) are listed below for reference.   Imaging Studies: DG HIP UNILAT WITH PELVIS 1V RIGHT  Result Date: 02/05/2023 CLINICAL DATA:  Elective surgery. EXAM: DG HIP (WITH OR WITHOUT PELVIS) 1V RIGHT COMPARISON:  None Available.  FINDINGS: Two fluoroscopic spot views of the pelvis and right hip obtained in the operating room. Images during hip arthroplasty. Fluoroscopy time 10 seconds. Dose 1.0 mGy. IMPRESSION: Fluoroscopic spot views during right hip arthroplasty. Electronically  Signed   By: Narda Rutherford M.D.   On: 02/05/2023 18:31   DG C-Arm 1-60 Min-No Report  Result Date: 02/05/2023 Fluoroscopy was utilized by the requesting physician.  No radiographic interpretation.   DG Chest 2 View  Result Date: 02/05/2023 CLINICAL DATA:  Preop chest exam. EXAM: CHEST - 2 VIEW COMPARISON:  Chest x-ray Feb 03, 2023. FINDINGS: The heart size and mediastinal contours are unchanged. Both lungs are clear. No visible pleural effusions or pneumothorax. No acute osseous abnormality. Prior thoracic kyphoplasty for compression fracture with similar height loss. IMPRESSION: No active cardiopulmonary disease. Electronically Signed   By: Feliberto Harts M.D.   On: 02/05/2023 08:55   DG Chest 1 View  Result Date: 02/03/2023 CLINICAL DATA:  Fracture right hip EXAM: CHEST  1 VIEW COMPARISON:  01/18/2023 FINDINGS: Transverse diameter of heart is increased. Thoracic aorta is tortuous and ectatic. Lung fields are clear of any infiltrates or pulmonary edema. There is no pleural effusion or pneumothorax. There is vertebroplasty in multiple thoracic and upper lumbar vertebrae. IMPRESSION: There are no focal infiltrates or signs of pulmonary edema. Electronically Signed   By: Ernie Avena M.D.   On: 02/03/2023 16:12   DG Hip Unilat With Pelvis 2-3 Views Right  Result Date: 02/03/2023 CLINICAL DATA:  Trauma, fall EXAM: DG HIP (WITH OR WITHOUT PELVIS) 2-3V RIGHT COMPARISON:  None Available. FINDINGS: Acute fracture is seen in the neck of right femur. There is overriding of fracture fragments. There is no dislocation. IMPRESSION: Acute displaced fracture is seen in the neck of proximal right femur. Electronically Signed   By: Ernie Avena  M.D.   On: 02/03/2023 16:11   LONG TERM MONITOR (3-14 DAYS)  Result Date: 01/27/2023 Cardiac monitor [facility] Patch): December 23, 2022 -Jan 06, 2023 Dominant rhythm sinus, followed by atrial fibrillation/flutter (burden 7%). Overall heart rate 36-179 bpm.  Avg HR 62 bpm. Sinus heart rate 36 - 119 bpm. Avg HR 58 bpm. No atrial fibrillation, high grade AV block, pauses (3 seconds or longer). Total ventricular ectopic burden <1%. 1 episode of NSVT (auto triggered), on 12/24/2022 at 6:19 pm, 5 beats, 2.6 seconds duration, max HR 126 bpm. Total supraventricular ectopic burden 4% (predominantly as isolated beats). Atrial fibrillation/flutter burden 7% during the monitoring period.  Majority of the episodes were atrial flutter.  Fastest episode occurred on 01/02/2023 at 10:52 am max HR 179 bpm. Longest episode of atrial flutter 22hr 56 min on 01/02/2023. Patient triggered events: 3.  Underlying rhythm sinus with occasional PACs.   DG Chest Port 1 View  Result Date: 01/18/2023 CLINICAL DATA:  Atrial fibrillation EXAM: PORTABLE CHEST 1 VIEW COMPARISON:  None Available. FINDINGS: Hyperinflation. No consolidation, pneumothorax or effusion. No edema. Normal cardiopericardial silhouette. Overlapping cardiac leads. Augmentation cement seen along multiple vertebral bodies of the spine, unchanged from previous. IMPRESSION: No acute cardiopulmonary disease. Electronically Signed   By: Karen Kays M.D.   On: 01/18/2023 10:09    Microbiology: Results for orders placed or performed during the hospital encounter of 02/03/23  MRSA Next Gen by PCR, Nasal     Status: None   Collection Time: 02/04/23  5:57 AM   Specimen: Nasal Mucosa; Nasal Swab  Result Value Ref Range Status   MRSA by PCR Next Gen NOT DETECTED NOT DETECTED Final    Comment: (NOTE) The GeneXpert MRSA Assay (FDA approved for NASAL specimens only), is one component of a comprehensive MRSA colonization surveillance program. It is not intended to diagnose MRSA  infection  nor to guide or monitor treatment for MRSA infections. Test performance is not FDA approved in patients less than 40 years old. Performed at [facility], [address], [place], [place] [address]    Labs: CBC: Recent Labs  Lab 02/05/23 [phone] 02/06/23 0145 02/07/23 1124 02/08/23 0934 02/09/23 0927  WBC 6.8 7.1 10.5 8.3 8.2  NEUTROABS 4.6 5.7 8.2* 5.9 6.2  HGB 12.5* 11.7* 11.3* 11.5* 11.3*  HCT 38.8* 35.4* 33.8* 35.0* 33.9*  MCV 91.1 91.7 91.4 90.2 91.9  PLT 168 172 178 211 238   Basic Metabolic Panel: Recent Labs  Lab 02/05/23 0317 02/06/23 0145 02/07/23 1124 02/08/23 0934 02/09/23 0927  NA 137 132* 129* 133* 135  K 4.1 3.9 4.4 3.9 3.8  CL 103 102 100 103 106  CO2 23 21* 20* 21* 24  GLUCOSE 114* 148* 123* 112* 128*  BUN 18 13 18 20 20   CREATININE 1.12 1.00 1.17 1.09 1.11  CALCIUM 8.8* 8.3* 8.3* 8.4* 8.6*  MG 2.1 1.8 2.2 2.4 2.5*  PHOS 2.6 2.7 2.4* 3.1 2.6   Liver Function Tests: Recent Labs  Lab 02/05/23 0317 02/06/23 0145 02/07/23 1124 02/08/23 0934 02/09/23 0927  AST 22 27 32 74* 141*  ALT 6 6 6 8 11   ALKPHOS 47 38 42 52 71  BILITOT 1.3* 1.5* 1.5* 1.4* 1.3*  PROT 6.1* 5.8* 5.8* 5.9* 6.0*  ALBUMIN 3.2* 3.1* 2.7* 2.6* 2.5*   CBG: No results for input(s): "GLUCAP" in the last 168 hours.  Discharge time spent: greater than 30 minutes.  Signed: Marguerita Merles, DO [facility] 02/09/2023

## 2023-02-10 DIAGNOSIS — C259 Malignant neoplasm of pancreas, unspecified: Secondary | ICD-10-CM | POA: Diagnosis not present

## 2023-02-10 DIAGNOSIS — W19XXXD Unspecified fall, subsequent encounter: Secondary | ICD-10-CM | POA: Diagnosis not present

## 2023-02-10 DIAGNOSIS — G20A1 Parkinson's disease without dyskinesia, without mention of fluctuations: Secondary | ICD-10-CM | POA: Diagnosis not present

## 2023-02-10 DIAGNOSIS — I480 Paroxysmal atrial fibrillation: Secondary | ICD-10-CM | POA: Diagnosis not present

## 2023-02-10 DIAGNOSIS — S72001D Fracture of unspecified part of neck of right femur, subsequent encounter for closed fracture with routine healing: Secondary | ICD-10-CM | POA: Diagnosis not present

## 2023-02-22 ENCOUNTER — Ambulatory Visit: Payer: Medicare Other | Admitting: Cardiology

## 2023-02-22 DIAGNOSIS — S72041A Displaced fracture of base of neck of right femur, initial encounter for closed fracture: Secondary | ICD-10-CM | POA: Diagnosis not present

## 2023-03-01 DIAGNOSIS — S72001S Fracture of unspecified part of neck of right femur, sequela: Secondary | ICD-10-CM | POA: Diagnosis not present

## 2023-03-01 DIAGNOSIS — M25551 Pain in right hip: Secondary | ICD-10-CM | POA: Diagnosis not present

## 2023-03-01 DIAGNOSIS — R278 Other lack of coordination: Secondary | ICD-10-CM | POA: Diagnosis not present

## 2023-03-01 DIAGNOSIS — R2681 Unsteadiness on feet: Secondary | ICD-10-CM | POA: Diagnosis not present

## 2023-03-01 DIAGNOSIS — R2689 Other abnormalities of gait and mobility: Secondary | ICD-10-CM | POA: Diagnosis not present

## 2023-03-01 DIAGNOSIS — M62551 Muscle wasting and atrophy, not elsewhere classified, right thigh: Secondary | ICD-10-CM | POA: Diagnosis not present

## 2023-03-01 DIAGNOSIS — Z96641 Presence of right artificial hip joint: Secondary | ICD-10-CM | POA: Diagnosis not present

## 2023-03-02 DIAGNOSIS — L570 Actinic keratosis: Secondary | ICD-10-CM | POA: Diagnosis not present

## 2023-03-02 DIAGNOSIS — R2681 Unsteadiness on feet: Secondary | ICD-10-CM | POA: Diagnosis not present

## 2023-03-02 DIAGNOSIS — R2689 Other abnormalities of gait and mobility: Secondary | ICD-10-CM | POA: Diagnosis not present

## 2023-03-02 DIAGNOSIS — L814 Other melanin hyperpigmentation: Secondary | ICD-10-CM | POA: Diagnosis not present

## 2023-03-02 DIAGNOSIS — R278 Other lack of coordination: Secondary | ICD-10-CM | POA: Diagnosis not present

## 2023-03-02 DIAGNOSIS — M25551 Pain in right hip: Secondary | ICD-10-CM | POA: Diagnosis not present

## 2023-03-02 DIAGNOSIS — S72001S Fracture of unspecified part of neck of right femur, sequela: Secondary | ICD-10-CM | POA: Diagnosis not present

## 2023-03-02 DIAGNOSIS — L853 Xerosis cutis: Secondary | ICD-10-CM | POA: Diagnosis not present

## 2023-03-02 DIAGNOSIS — L821 Other seborrheic keratosis: Secondary | ICD-10-CM | POA: Diagnosis not present

## 2023-03-02 DIAGNOSIS — L905 Scar conditions and fibrosis of skin: Secondary | ICD-10-CM | POA: Diagnosis not present

## 2023-03-02 DIAGNOSIS — M62551 Muscle wasting and atrophy, not elsewhere classified, right thigh: Secondary | ICD-10-CM | POA: Diagnosis not present

## 2023-03-03 DIAGNOSIS — M62551 Muscle wasting and atrophy, not elsewhere classified, right thigh: Secondary | ICD-10-CM | POA: Diagnosis not present

## 2023-03-03 DIAGNOSIS — R278 Other lack of coordination: Secondary | ICD-10-CM | POA: Diagnosis not present

## 2023-03-03 DIAGNOSIS — M25551 Pain in right hip: Secondary | ICD-10-CM | POA: Diagnosis not present

## 2023-03-03 DIAGNOSIS — R2681 Unsteadiness on feet: Secondary | ICD-10-CM | POA: Diagnosis not present

## 2023-03-03 DIAGNOSIS — R2689 Other abnormalities of gait and mobility: Secondary | ICD-10-CM | POA: Diagnosis not present

## 2023-03-03 DIAGNOSIS — S72001S Fracture of unspecified part of neck of right femur, sequela: Secondary | ICD-10-CM | POA: Diagnosis not present

## 2023-03-04 DIAGNOSIS — M25551 Pain in right hip: Secondary | ICD-10-CM | POA: Diagnosis not present

## 2023-03-04 DIAGNOSIS — R278 Other lack of coordination: Secondary | ICD-10-CM | POA: Diagnosis not present

## 2023-03-04 DIAGNOSIS — R2689 Other abnormalities of gait and mobility: Secondary | ICD-10-CM | POA: Diagnosis not present

## 2023-03-04 DIAGNOSIS — R2681 Unsteadiness on feet: Secondary | ICD-10-CM | POA: Diagnosis not present

## 2023-03-04 DIAGNOSIS — M62551 Muscle wasting and atrophy, not elsewhere classified, right thigh: Secondary | ICD-10-CM | POA: Diagnosis not present

## 2023-03-04 DIAGNOSIS — S72001S Fracture of unspecified part of neck of right femur, sequela: Secondary | ICD-10-CM | POA: Diagnosis not present

## 2023-03-05 DIAGNOSIS — M25551 Pain in right hip: Secondary | ICD-10-CM | POA: Diagnosis not present

## 2023-03-05 DIAGNOSIS — M62551 Muscle wasting and atrophy, not elsewhere classified, right thigh: Secondary | ICD-10-CM | POA: Diagnosis not present

## 2023-03-05 DIAGNOSIS — R2689 Other abnormalities of gait and mobility: Secondary | ICD-10-CM | POA: Diagnosis not present

## 2023-03-05 DIAGNOSIS — S72001S Fracture of unspecified part of neck of right femur, sequela: Secondary | ICD-10-CM | POA: Diagnosis not present

## 2023-03-05 DIAGNOSIS — R2681 Unsteadiness on feet: Secondary | ICD-10-CM | POA: Diagnosis not present

## 2023-03-05 DIAGNOSIS — R278 Other lack of coordination: Secondary | ICD-10-CM | POA: Diagnosis not present

## 2023-03-08 DIAGNOSIS — Z96641 Presence of right artificial hip joint: Secondary | ICD-10-CM | POA: Diagnosis not present

## 2023-03-08 DIAGNOSIS — R2689 Other abnormalities of gait and mobility: Secondary | ICD-10-CM | POA: Diagnosis not present

## 2023-03-08 DIAGNOSIS — M25551 Pain in right hip: Secondary | ICD-10-CM | POA: Diagnosis not present

## 2023-03-08 DIAGNOSIS — S72001S Fracture of unspecified part of neck of right femur, sequela: Secondary | ICD-10-CM | POA: Diagnosis not present

## 2023-03-08 DIAGNOSIS — M62551 Muscle wasting and atrophy, not elsewhere classified, right thigh: Secondary | ICD-10-CM | POA: Diagnosis not present

## 2023-03-08 DIAGNOSIS — R278 Other lack of coordination: Secondary | ICD-10-CM | POA: Diagnosis not present

## 2023-03-08 DIAGNOSIS — R2681 Unsteadiness on feet: Secondary | ICD-10-CM | POA: Diagnosis not present

## 2023-03-09 DIAGNOSIS — R2681 Unsteadiness on feet: Secondary | ICD-10-CM | POA: Diagnosis not present

## 2023-03-09 DIAGNOSIS — M62551 Muscle wasting and atrophy, not elsewhere classified, right thigh: Secondary | ICD-10-CM | POA: Diagnosis not present

## 2023-03-09 DIAGNOSIS — R278 Other lack of coordination: Secondary | ICD-10-CM | POA: Diagnosis not present

## 2023-03-09 DIAGNOSIS — M25551 Pain in right hip: Secondary | ICD-10-CM | POA: Diagnosis not present

## 2023-03-09 DIAGNOSIS — S72001S Fracture of unspecified part of neck of right femur, sequela: Secondary | ICD-10-CM | POA: Diagnosis not present

## 2023-03-09 DIAGNOSIS — R2689 Other abnormalities of gait and mobility: Secondary | ICD-10-CM | POA: Diagnosis not present

## 2023-03-11 DIAGNOSIS — R2689 Other abnormalities of gait and mobility: Secondary | ICD-10-CM | POA: Diagnosis not present

## 2023-03-11 DIAGNOSIS — M25551 Pain in right hip: Secondary | ICD-10-CM | POA: Diagnosis not present

## 2023-03-11 DIAGNOSIS — S72001S Fracture of unspecified part of neck of right femur, sequela: Secondary | ICD-10-CM | POA: Diagnosis not present

## 2023-03-11 DIAGNOSIS — R2681 Unsteadiness on feet: Secondary | ICD-10-CM | POA: Diagnosis not present

## 2023-03-11 DIAGNOSIS — M62551 Muscle wasting and atrophy, not elsewhere classified, right thigh: Secondary | ICD-10-CM | POA: Diagnosis not present

## 2023-03-11 DIAGNOSIS — R278 Other lack of coordination: Secondary | ICD-10-CM | POA: Diagnosis not present

## 2023-03-12 ENCOUNTER — Other Ambulatory Visit: Payer: Self-pay | Admitting: Cardiology

## 2023-03-12 ENCOUNTER — Other Ambulatory Visit: Payer: Self-pay | Admitting: Gastroenterology

## 2023-03-12 ENCOUNTER — Other Ambulatory Visit: Payer: Self-pay | Admitting: Neurology

## 2023-03-12 DIAGNOSIS — R278 Other lack of coordination: Secondary | ICD-10-CM | POA: Diagnosis not present

## 2023-03-12 DIAGNOSIS — G20A1 Parkinson's disease without dyskinesia, without mention of fluctuations: Secondary | ICD-10-CM

## 2023-03-12 DIAGNOSIS — M25551 Pain in right hip: Secondary | ICD-10-CM | POA: Diagnosis not present

## 2023-03-12 DIAGNOSIS — M62551 Muscle wasting and atrophy, not elsewhere classified, right thigh: Secondary | ICD-10-CM | POA: Diagnosis not present

## 2023-03-12 DIAGNOSIS — S72001S Fracture of unspecified part of neck of right femur, sequela: Secondary | ICD-10-CM | POA: Diagnosis not present

## 2023-03-12 DIAGNOSIS — R140 Abdominal distension (gaseous): Secondary | ICD-10-CM

## 2023-03-12 DIAGNOSIS — R2681 Unsteadiness on feet: Secondary | ICD-10-CM | POA: Diagnosis not present

## 2023-03-12 DIAGNOSIS — R2689 Other abnormalities of gait and mobility: Secondary | ICD-10-CM | POA: Diagnosis not present

## 2023-03-12 DIAGNOSIS — K5900 Constipation, unspecified: Secondary | ICD-10-CM

## 2023-03-15 ENCOUNTER — Encounter: Payer: Self-pay | Admitting: Hematology

## 2023-03-15 ENCOUNTER — Other Ambulatory Visit: Payer: Self-pay | Admitting: Hematology

## 2023-03-15 DIAGNOSIS — S72001S Fracture of unspecified part of neck of right femur, sequela: Secondary | ICD-10-CM | POA: Diagnosis not present

## 2023-03-15 DIAGNOSIS — R278 Other lack of coordination: Secondary | ICD-10-CM | POA: Diagnosis not present

## 2023-03-15 DIAGNOSIS — R2689 Other abnormalities of gait and mobility: Secondary | ICD-10-CM | POA: Diagnosis not present

## 2023-03-15 DIAGNOSIS — R2681 Unsteadiness on feet: Secondary | ICD-10-CM | POA: Diagnosis not present

## 2023-03-15 DIAGNOSIS — M62551 Muscle wasting and atrophy, not elsewhere classified, right thigh: Secondary | ICD-10-CM | POA: Diagnosis not present

## 2023-03-15 DIAGNOSIS — M25551 Pain in right hip: Secondary | ICD-10-CM | POA: Diagnosis not present

## 2023-03-15 MED ORDER — OXYCODONE HCL 5 MG PO TABS: 2.5000 mg | ORAL_TABLET | Freq: Four times a day (QID) | ORAL | 0 refills | Status: DC | PRN

## 2023-03-15 MED ORDER — OXYCODONE HCL 5 MG PO TABS: 5.0000 mg | ORAL_TABLET | Freq: Four times a day (QID) | ORAL | 0 refills | Status: DC | PRN

## 2023-03-16 DIAGNOSIS — M62551 Muscle wasting and atrophy, not elsewhere classified, right thigh: Secondary | ICD-10-CM | POA: Diagnosis not present

## 2023-03-16 DIAGNOSIS — S72001S Fracture of unspecified part of neck of right femur, sequela: Secondary | ICD-10-CM | POA: Diagnosis not present

## 2023-03-16 DIAGNOSIS — R2681 Unsteadiness on feet: Secondary | ICD-10-CM | POA: Diagnosis not present

## 2023-03-16 DIAGNOSIS — M25551 Pain in right hip: Secondary | ICD-10-CM | POA: Diagnosis not present

## 2023-03-16 DIAGNOSIS — R278 Other lack of coordination: Secondary | ICD-10-CM | POA: Diagnosis not present

## 2023-03-16 DIAGNOSIS — R2689 Other abnormalities of gait and mobility: Secondary | ICD-10-CM | POA: Diagnosis not present

## 2023-03-18 ENCOUNTER — Inpatient Hospital Stay: Payer: Medicare Other | Attending: Internal Medicine

## 2023-03-18 ENCOUNTER — Other Ambulatory Visit: Payer: Self-pay

## 2023-03-18 ENCOUNTER — Inpatient Hospital Stay (HOSPITAL_BASED_OUTPATIENT_CLINIC_OR_DEPARTMENT_OTHER): Payer: Medicare Other | Admitting: Hematology

## 2023-03-18 ENCOUNTER — Encounter: Payer: Self-pay | Admitting: Hematology

## 2023-03-18 VITALS — BP 115/66 | HR 55 | Temp 97.8°F | Resp 18 | Ht 65.0 in | Wt 153.5 lb

## 2023-03-18 DIAGNOSIS — Z7901 Long term (current) use of anticoagulants: Secondary | ICD-10-CM | POA: Diagnosis not present

## 2023-03-18 DIAGNOSIS — S72001S Fracture of unspecified part of neck of right femur, sequela: Secondary | ICD-10-CM | POA: Diagnosis not present

## 2023-03-18 DIAGNOSIS — C250 Malignant neoplasm of head of pancreas: Secondary | ICD-10-CM | POA: Insufficient documentation

## 2023-03-18 DIAGNOSIS — Z9049 Acquired absence of other specified parts of digestive tract: Secondary | ICD-10-CM | POA: Insufficient documentation

## 2023-03-18 DIAGNOSIS — Z887 Allergy status to serum and vaccine status: Secondary | ICD-10-CM | POA: Insufficient documentation

## 2023-03-18 DIAGNOSIS — Z79899 Other long term (current) drug therapy: Secondary | ICD-10-CM | POA: Insufficient documentation

## 2023-03-18 DIAGNOSIS — I700 Atherosclerosis of aorta: Secondary | ICD-10-CM | POA: Diagnosis not present

## 2023-03-18 DIAGNOSIS — C7951 Secondary malignant neoplasm of bone: Secondary | ICD-10-CM | POA: Insufficient documentation

## 2023-03-18 DIAGNOSIS — C259 Malignant neoplasm of pancreas, unspecified: Secondary | ICD-10-CM

## 2023-03-18 DIAGNOSIS — Z905 Acquired absence of kidney: Secondary | ICD-10-CM | POA: Insufficient documentation

## 2023-03-18 DIAGNOSIS — C61 Malignant neoplasm of prostate: Secondary | ICD-10-CM

## 2023-03-18 DIAGNOSIS — R278 Other lack of coordination: Secondary | ICD-10-CM | POA: Diagnosis not present

## 2023-03-18 DIAGNOSIS — R2681 Unsteadiness on feet: Secondary | ICD-10-CM | POA: Diagnosis not present

## 2023-03-18 DIAGNOSIS — R2689 Other abnormalities of gait and mobility: Secondary | ICD-10-CM | POA: Diagnosis not present

## 2023-03-18 DIAGNOSIS — G20A1 Parkinson's disease without dyskinesia, without mention of fluctuations: Secondary | ICD-10-CM | POA: Insufficient documentation

## 2023-03-18 DIAGNOSIS — M25551 Pain in right hip: Secondary | ICD-10-CM | POA: Diagnosis not present

## 2023-03-18 DIAGNOSIS — M62551 Muscle wasting and atrophy, not elsewhere classified, right thigh: Secondary | ICD-10-CM | POA: Diagnosis not present

## 2023-03-18 LAB — CMP (CANCER CENTER ONLY)
ALT: <5 U/L (ref 0–44)
AST: 25 U/L (ref 15–41)
Albumin: 3.8 g/dL (ref 3.5–5.0)
Alkaline Phosphatase: 78 U/L (ref 38–126)
Anion gap: 7 (ref 5–15)
BUN: 26 mg/dL — ABNORMAL HIGH (ref 8–23)
CO2: 26 mmol/L (ref 22–32)
Calcium: 10.1 mg/dL (ref 8.9–10.3)
Chloride: 106 mmol/L (ref 98–111)
Creatinine: 0.99 mg/dL (ref 0.61–1.24)
GFR, Estimated: >60 mL/min (ref >60)
Glucose, Bld: 108 mg/dL — ABNORMAL HIGH (ref 70–99)
Potassium: 4.4 mmol/L (ref 3.5–5.1)
Sodium: 139 mmol/L (ref 135–145)
Total Bilirubin: 0.8 mg/dL (ref 0.3–1.2)
Total Protein: 7.5 g/dL (ref 6.5–8.1)

## 2023-03-18 LAB — CBC WITH DIFFERENTIAL (CANCER CENTER ONLY)
Abs Immature Granulocytes: 0.02 10*3/uL (ref 0.00–0.07)
Basophils Absolute: 0.1 10*3/uL (ref 0.0–0.1)
Basophils Relative: 1 %
Eosinophils Absolute: 0.2 10*3/uL (ref 0.0–0.5)
Eosinophils Relative: 4 %
HCT: 37.4 % — ABNORMAL LOW (ref 39.0–52.0)
Hemoglobin: 12.1 g/dL — ABNORMAL LOW (ref 13.0–17.0)
Immature Granulocytes: 0 %
Lymphocytes Relative: 20 %
Lymphs Abs: 1.3 10*3/uL (ref 0.7–4.0)
MCH: 29.8 pg (ref 26.0–34.0)
MCHC: 32.4 g/dL (ref 30.0–36.0)
MCV: 92.1 fL (ref 80.0–100.0)
Monocytes Absolute: 0.5 10*3/uL (ref 0.1–1.0)
Monocytes Relative: 8 %
Neutro Abs: 4.3 10*3/uL (ref 1.7–7.7)
Neutrophils Relative %: 67 %
Platelet Count: 251 10*3/uL (ref 150–400)
RBC: 4.06 MIL/uL — ABNORMAL LOW (ref 4.22–5.81)
RDW: 13.9 % (ref 11.5–15.5)
WBC Count: 6.4 10*3/uL (ref 4.0–10.5)
nRBC: 0 % (ref 0.0–0.2)

## 2023-03-18 NOTE — Assessment & Plan Note (Signed)
-  initially diagnosed with Gleason 4+5 prostate cancer in 04/2019 for elevated PSA of 26.5. Staging CT AP on 05/16/19 showed two suspicious left pelvic lesions. He was started on Eligard q6months -Patient also receives Reclast once a year. -he did not tolerate Zytiga -Last PSA drawn on 04/14/22 showed good response at <0.01  -continue Eligard every 6 months, will receive per urologist Dr. 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 

## 2023-03-18 NOTE — Assessment & Plan Note (Addendum)
diagnosed in 09/2021, deemed poor surgical candidate due to his age and medical co-morbidities -S/p single agent gemcitabine 01/13/22 - 04/01/22 and 5 fx SBRT [name]) 05/04/22 - 05/14/22. CA 19-9 decreased after treatment -Surveillance CT 11/09/2022 showed slightly decreased size of pancreatic head mass, no evidence of metastatic disease.  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  He is taking ibuprofen as needed.  No other clinical concern for cancer progression. -Lab reviewed, overall stable, tumor marker still pending. -He was admitted for right femoral neck fracture  on 02/03/23 after a fall and had surgery  -He is clinically stable, plan to repeat a CT scan in 2 months.  Patient is interested in more chemo if he has disease progression.

## 2023-03-18 NOTE — Progress Notes (Signed)
[facility]   [phone] Fax:([phone]) [phone]   Clinic Follow up Note   Patient Care Team: Lorenda Ishihara, MD as PCP - General (Internal Medicine) Tat, Octaviano Batty, DO as Consulting Physician (Neurology) Earna Coder, MD as Consulting Physician (Hematology and Oncology) Fritzi Mandes, MD as Consulting Physician (General Surgery) Malachy Mood, MD as Consulting Physician (Hematology and Oncology)  Date of Service:  03/18/2023  CHIEF COMPLAINT: f/u of  pancreatic cancer     CURRENT THERAPY:  Supportive care   ASSESSMENT:  [name] [name] is a 82 y.o. male with   Pancreatic cancer (HCC) diagnosed in 09/2021, deemed poor surgical candidate due to his age and medical co-morbidities -S/p single agent gemcitabine 01/13/22 - 04/01/22 and 5 fx SBRT [name]) 05/04/22 - 05/14/22. CA 19-9 decreased after treatment -Surveillance CT 11/09/2022 showed slightly decreased size of pancreatic head mass, no evidence of metastatic disease.  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  He is taking ibuprofen as needed.  No other clinical concern for cancer progression. -Lab reviewed, overall stable, tumor marker still pending. -He was admitted for right femoral neck fracture  on 02/03/23 after a fall and had surgery  -He is clinically stable, plan to repeat a CT scan in 2 months.  Patient is interested in more chemo if he has disease progression.  Prostate cancer metastatic to bone [facility]) -initially diagnosed with Gleason 4+5 prostate cancer in 04/2019 for elevated PSA of 26.5. Staging CT AP on 05/16/19 showed two suspicious left pelvic lesions. He was started on Eligard q50months -Patient also receives Reclast once a year. -he did not tolerate Zytiga -Last PSA drawn on 04/14/22 showed good response at <0.01  -continue Eligard every 6 months, will receive per urologist Dr. Berneice Heinrich  -restaging CT CAP 3/4//24 showed  stable osseous metastasis at left iliac and pubic bones.    Right hip fracture -s/p surgery on 02/05/2023 -He is recovering well overall   PLAN: -lab reviewed -tumor marker-pending - I order CT scan in 2 months -lab and f/.u in 2 months -Continue oxycodone as needed for his abdominal pain and right hip pain, I will refill if needed   SUMMARY OF ONCOLOGIC HISTORY: Oncology History Overview Note  # PROSTATE CANCER- METATSTATIC to BONE; PSA- 26.5. Sclerotic 1.5 cm left ischial lesion/ Sclerotic medial left iliac bone 1.5 cm lesion (series 2/image 47), increased from 1.0 cm. Gleason score of 4+5= 9; with almost all cores involved greater than 80%.  9/16 Lupron 77-month depot on 9/16. [Urology; Dr.Siniski]  # MID OCT 2020- Zytiga 1000 mg+ prednisone; stopped December 2021 [poor tolerance if with RVR]; DISCONTINUED.   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       Prostate cancer metastatic to bone (HCC)  05/24/2019 Initial Diagnosis   Prostate cancer metastatic to bone [facility])   Pancreatic cancer (HCC)  10/02/2021 Imaging   CT ABDOMEN PELVIS W CONTRAST   IMPRESSION: 1. There is new pancreatic ductal dilation with an indeterminate 19 mm hypodense low-density mass area in the head/uncinate process of the pancreas. Recommend further evaluation with dedicated MRI/MRCP with contrast. 2. No evidence of bowel obstruction.  Moderate rectal stool ball. 3. Scattered peripherally located clustered pulmonary nodules in the  RIGHT middle lobe. Findings are likely infectious or inflammatory in etiology. Given history of malignancy, recommend follow-up as per clinical protocol.   10/28/2021 Imaging   MR ABDOMEN MRCP W WO CONTAST   IMPRESSION: 1. Examination is significantly limited by  breath motion artifact throughout. 2. The main pancreatic duct is diffusely dilated from the level of the superior pancreatic head, measuring up to 0.7 cm. 3. In the inferior pancreatic head and uncinate, there is a multilobulated, fluid signal cystic lesion measuring 1.8 x 1.0 cm. Due to breath motion artifact it is difficult to determine whether this communicates to the adjacent duct. There are multiple additional subcentimeter cystic lesions scattered throughout the pancreas, several of which clearly communicate to the main pancreatic duct. 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 Recommend EUS/FNA for further diagnosis given the presence of pancreatic ductal dilatation. 4. Status post left nephrectomy. 5. No evidence of recurrent or metastatic disease in the abdomen.   12/25/2021 Procedure   UPPER ENDOSCOPIC ULTRASOUND-By Dr. Meridee Score  - A mass-like region was identified in the pancreatic head where the pancreatic duct dilates with multiple cystic regions noted throughout the pancreas as well. 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 However, the endosonographic appearance is suspicious for potential adenocarcinoma. This was staged T2 N0 Mx by endosonographic criteria. T - No malignant-appearing lymph nodes were visualized in the celiac region (level 20), peripancreatic region and porta hepatis region.   12/25/2021 Pathology Results   CYTOLOGY - NON PAP  CASE: [id number]   FINAL MICROSCOPIC DIAGNOSIS:  A. PANCREAS, HEAD, FINE NEEDLE ASPIRATION:  - Malignant cells consistent with adenocarcinoma     01/01/2022 Initial Diagnosis   Pancreatic cancer (HCC)   01/13/2022 - 04/01/2022 Chemotherapy   Patient is on Treatment Plan : PANCREAS Gemcitabine D1,15 q28d x 4 Cycles     01/26/2022 Cancer Staging   Staging form: Exocrine Pancreas, AJCC 8th Edition - Clinical: Stage IB (cT2, cN0, cM0) - Signed by Malachy Mood,  MD on 01/26/2022 Total positive nodes: 0    Genetic Testing   [facility] CancerNext-Expanded Panel was Negative. Of note, a variant of uncertain significance was identified in the BLM gene ([id number]) and GALNT12 gene ([id number]). Report date is 01/26/2022.  The CancerNext-Expanded gene panel offered by [facility] and includes sequencing, rearrangement, and RNA analysis for the following 77 genes: AIP, ALK, APC, ATM, AXIN2, BAP1, BARD1, BLM, BMPR1A, BRCA1, BRCA2, BRIP1, CDC73, CDH1, CDK4, CDKN1B, CDKN2A, CHEK2, CTNNA1, DICER1, FANCC, FH, FLCN, GALNT12, KIF1B, LZTR1, MAX, MEN1, MET, MLH1, MSH2, MSH3, MSH6, MUTYH, NBN, NF1, NF2, NTHL1, PALB2, PHOX2B, PMS2, POT1, PRKAR1A, PTCH1, PTEN, RAD51C, RAD51D, RB1, RECQL, RET, SDHA, SDHAF2, SDHB, SDHC, SDHD, SMAD4, SMARCA4, SMARCB1, SMARCE1, STK11, SUFU, TMEM127, TP53, TSC1, TSC2, VHL and XRCC2 (sequencing and deletion/duplication); EGFR, EGLN1, HOXB13, KIT, MITF, PDGFRA, POLD1, and POLE (sequencing only); EPCAM and GREM1 (deletion/duplication only).    08/12/2022 Imaging    IMPRESSION: 1. Slight interval increase in size of the pancreatic head-uncinate process mass with increasing direct contact of the SMA and portal vein as discussed. Also with a replaced RIGHT hepatic artery which passes through the tumor. 2. No signs of acute inflammation currently about the pancreas. With similar appearance of ductal obstruction and peripheral atrophy of pancreas due to the tumor. 3. Post LEFT nephrectomy. 4. Sclerotic bony lesions of the LEFT ischium and LEFT iliac bone similar to prior imaging. 5. Mild hyperenhancement of LEFT prostate 14 mm with asymmetry, nonspecific. This is unchanged  in this patient with history of prostate neoplasm. 6. Aortic atherosclerosis.      INTERVAL HISTORY:  [name] [name] is here for a follow up of  pancreatic cancer . He was last seen by me on 01/15/2023. He presents to the clinic accompanied by daughter. Pt state that he  is eating well. He also had fell and broken his hip and is recovering well. Pt is taking physical therapy and he is using the walker. Pt denies having coughing and SOB.   All other systems were reviewed with the patient and are negative.  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)    Prostate cancer metastatic to bone (HCC)    REM sleep behavior disorder    Sleep apnea    BiPap    SURGICAL HISTORY: Past Surgical History:  Procedure Laterality Date   ANTERIOR APPROACH HEMI HIP ARTHROPLASTY Right 02/05/2023   Procedure: ANTERIOR APPROACH HEMI HIP ARTHROPLASTY;  Surgeon: Jodi Geralds, MD;  Location: [facility] OR;  Service: Orthopedics;  Laterality: Right;   APPENDECTOMY  1963   BIOPSY  12/25/2021   Procedure: BIOPSY;  Surgeon: Lemar Lofty., MD;  Location: [facility] ENDOSCOPY;  Service: Gastroenterology;;   COLONOSCOPY  2010, 2015   ESOPHAGOGASTRODUODENOSCOPY N/A 04/16/2022   Procedure: ESOPHAGOGASTRODUODENOSCOPY (EGD);  Surgeon: Lemar Lofty., MD;  Location: [facility] ENDOSCOPY;  Service: Gastroenterology;  Laterality: N/A;   ESOPHAGOGASTRODUODENOSCOPY (EGD) WITH PROPOFOL N/A 12/25/2021   Procedure: ESOPHAGOGASTRODUODENOSCOPY (EGD) WITH PROPOFOL;  Surgeon: Meridee Score Netty Starring., MD;  Location: [facility] ENDOSCOPY;  Service: Gastroenterology;  Laterality: N/A;   EUS N/A 12/25/2021   Procedure: UPPER ENDOSCOPIC ULTRASOUND (EUS) RADIAL;  Surgeon: Lemar Lofty., MD;  Location: [facility] ENDOSCOPY;  Service: Gastroenterology;  Laterality: N/A;   EUS N/A 04/16/2022   Procedure: UPPER ENDOSCOPIC ULTRASOUND (EUS) RADIAL;  Surgeon: Lemar Lofty., MD;  Location: [facility] ENDOSCOPY;  Service: Gastroenterology;  Laterality: N/A;   FIDUCIAL MARKER PLACEMENT N/A  04/16/2022   Procedure: FIDUCIAL MARKER PLACEMENT;  Surgeon: Lemar Lofty., MD;  Location: [facility] ENDOSCOPY;  Service: Gastroenterology;  Laterality: N/A;   FINE NEEDLE ASPIRATION  12/25/2021   Procedure: FINE NEEDLE ASPIRATION (FNA) LINEAR;  Surgeon: Lemar Lofty., MD;  Location: [facility] ENDOSCOPY;  Service: Gastroenterology;;   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   KYPHOPLASTY N/A 08/15/2020   Procedure: L2 compression fracture;  Surgeon: Kennedy Bucker, MD;  Location: [facility] ORS;  Service: Orthopedics;  Laterality: N/A;   KYPHOPLASTY N/A 08/29/2020   Procedure: T8 KYPHOPLASTY;  Surgeon: Kennedy Bucker, MD;  Location: [facility] ORS;  Service: Orthopedics;  Laterality: N/A;   KYPHOPLASTY N/A 11/12/2020   Procedure: L1 KYPHOPLASTY;  Surgeon: Kennedy Bucker, MD;  Location: [facility] ORS;  Service: Orthopedics;  Laterality: N/A;   PORTACATH PLACEMENT N/A 02/05/2022   Procedure: INSERTION PORT-A-CATH;  Surgeon: Fritzi Mandes, MD;  Location: [facility] ORS;  Service: General;  Laterality: N/A;    I have reviewed the social history and family history with the patient and they are unchanged from previous note.  ALLERGIES:  is allergic to influenza vaccines and influenza virus vaccine.  MEDICATIONS:  Current Outpatient Medications  Medication Sig Dispense Refill   acetaminophen (TYLENOL) 500 MG tablet Take 1 tablet (500 mg total) by mouth every  6 (six) hours as needed for moderate pain or mild pain. 30 tablet 0   bisacodyl (DULCOLAX) 10 MG suppository Place 1 suppository (10 mg total) rectally daily as needed for moderate constipation. 12 suppository 0   carbidopa-levodopa (SINEMET CR) 50-200 MG tablet TAKE 1 TABLET AT BEDTIME 90 tablet 0   carbidopa-levodopa (SINEMET IR) 25-100 MG tablet Take 3 tablets at 7 AM, 2 tablets at 11 AM, 3 tablets at 3 PM, 2 tablets at 7 PM 900 tablet 0   clonazePAM (KLONOPIN) 0.5 MG tablet Take 1 tablet (0.5 mg total) by mouth at bedtime. 5 tablet 0   cyanocobalamin (VITAMIN B12) 1000 MCG  tablet Take 1,000 mcg by mouth daily.     diltiazem (CARDIZEM SR) 90 MG 12 hr capsule Take 1 capsule (90 mg total) by mouth 2 (two) times daily. Hold if systolic blood pressure (top number) less than 110 mmHg or pulse less than 55 bpm. 60 capsule 0   ELIQUIS 5 MG TABS tablet TAKE 1 TABLET TWICE A DAY 120 tablet 5   Glycerin-Hypromellose-PEG 400 (VISINE DRY EYE OP) Place 1 drop into both eyes 2 (two) times daily as needed (eye irritation).      ketoconazole (NIZORAL) 2 % shampoo Apply 1 application  topically 3 (three) times a week.     leuprolide, 6 Month, (ELIGARD) 45 MG injection Inject 45 mg into the skin every 6 (six) months.     omeprazole (PRILOSEC) 20 MG capsule TAKE 1 CAPSULE DAILY 90 capsule 3   ondansetron (ZOFRAN) 4 MG tablet Take 1 tablet (4 mg total) by mouth every 6 (six) hours as needed for nausea. 20 tablet 0   oxyCODONE (OXY IR/ROXICODONE) 5 MG immediate release tablet Take 1 tablet (5 mg total) by mouth every 6 (six) hours as needed for severe pain. 30 tablet 0   PANCREAZE 37000-97300 units CPEP TAKE 2 CAPSULES WITH BREAKFAST, LUNCH, AND EVENING MEAL (WITH EACH MEAL) AND 1 CAPSULE WITH SNACK 600 capsule 5   polyethylene glycol powder (MIRALAX) 17 GM/SCOOP powder Take 17 g by mouth in the morning and at bedtime.     prochlorperazine (COMPAZINE) 10 MG tablet Take 1 tablet (10 mg total) by mouth every 6 (six) hours as needed (Nausea or vomiting). (Patient taking differently: Take 10 mg by mouth every 6 (six) hours as needed for vomiting or nausea.) 30 tablet 1   rosuvastatin (CRESTOR) 10 MG tablet TAKE 1 TABLET DAILY 90 tablet 3   senna-docusate (SENOKOT-S) 8.6-50 MG tablet Take 1 tablet by mouth at bedtime.     tiZANidine (ZANAFLEX) 2 MG tablet Take 1 tablet (2 mg total) by mouth every 8 (eight) hours as needed for muscle spasms. 10 tablet 0   triamcinolone cream (KENALOG) 0.1 % Apply 1 application topically 2 (two) times daily. (Patient taking differently: Apply 1 application   topically 2 (two) times daily as needed (itching).) 453.6 g 1   zoledronic acid (RECLAST) 5 MG/100ML SOLN injection Inject 5 mg into the vein See admin instructions. Once a year     No current facility-administered medications for this visit.    PHYSICAL EXAMINATION: ECOG PERFORMANCE STATUS: 2 - Symptomatic, <50% confined to bed  Vitals:   03/18/23 1123  BP: 115/66  Pulse: (!) 55  Resp: 18  Temp: 97.8 F (36.6 C)  SpO2: 98%   Wt Readings from Last 3 Encounters:  03/18/23 153 lb 8 oz (69.6 kg)  02/05/23 152 lb (68.9 kg)  01/27/23 154 lb (69.9 kg)  GENERAL:alert, no distress and comfortable SKIN: skin color normal, no rashes or significant lesions EYES: normal, Conjunctiva are pink and non-injected, sclera clear  NEURO: alert & oriented x 3 with fluent speech LUNGS: (-)clear to auscultation and percussion with normal breathing effort HEART:(-)  regular rate & rhythm and no murmurs and(-) no lower extremity edema   LABORATORY DATA:  I have reviewed the data as listed    Latest Ref Rng & Units 03/18/2023   10:59 AM 02/09/2023    9:27 AM 02/08/2023    9:34 AM  CBC  WBC 4.0 - 10.5 K/uL 6.4  8.2  8.3   Hemoglobin 13.0 - 17.0 g/dL 62.9  52.8  41.3   Hematocrit 39.0 - 52.0 % 37.4  33.9  35.0   Platelets 150 - 400 K/uL 251  238  211         Latest Ref Rng & Units 03/18/2023   10:59 AM 02/09/2023    9:27 AM 02/08/2023    9:34 AM  CMP  Glucose 70 - 99 mg/dL 244  010  272   BUN 8 - 23 mg/dL 26  20  20    Creatinine 0.61 - 1.24 mg/dL 5.36  6.44  0.34   Sodium 135 - 145 mmol/L 139  135  133   Potassium 3.5 - 5.1 mmol/L 4.4  3.8  3.9   Chloride 98 - 111 mmol/L 106  106  103   CO2 22 - 32 mmol/L 26  24  21    Calcium 8.9 - 10.3 mg/dL 74.2  8.6  8.4   Total Protein 6.5 - 8.1 g/dL 7.5  6.0  5.9   Total Bilirubin 0.3 - 1.2 mg/dL 0.8  1.3  1.4   Alkaline Phos 38 - 126 U/L 78  71  52   AST 15 - 41 U/L 25  141  74   ALT 0 - 44 U/L 5  11  8        RADIOGRAPHIC STUDIES: I have  personally reviewed the radiological images as listed and agreed with the findings in the report. No results found.    No orders of the defined types were placed in this encounter.  All questions were answered. The patient knows to call the clinic with any problems, questions or concerns. No barriers to learning was detected. The total time spent in the appointment was 25 minutes.     Malachy Mood, MD 03/18/2023   Carolin Coy, CMA, am acting as scribe for Malachy Mood, MD.   I have reviewed the above documentation for accuracy and completeness, and I agree with the above.

## 2023-03-19 DIAGNOSIS — R2681 Unsteadiness on feet: Secondary | ICD-10-CM | POA: Diagnosis not present

## 2023-03-19 DIAGNOSIS — S72001S Fracture of unspecified part of neck of right femur, sequela: Secondary | ICD-10-CM | POA: Diagnosis not present

## 2023-03-19 DIAGNOSIS — M62551 Muscle wasting and atrophy, not elsewhere classified, right thigh: Secondary | ICD-10-CM | POA: Diagnosis not present

## 2023-03-19 DIAGNOSIS — B351 Tinea unguium: Secondary | ICD-10-CM | POA: Diagnosis not present

## 2023-03-19 DIAGNOSIS — M25551 Pain in right hip: Secondary | ICD-10-CM | POA: Diagnosis not present

## 2023-03-19 DIAGNOSIS — R2689 Other abnormalities of gait and mobility: Secondary | ICD-10-CM | POA: Diagnosis not present

## 2023-03-19 DIAGNOSIS — R278 Other lack of coordination: Secondary | ICD-10-CM | POA: Diagnosis not present

## 2023-03-19 DIAGNOSIS — L600 Ingrowing nail: Secondary | ICD-10-CM | POA: Diagnosis not present

## 2023-03-19 LAB — PROSTATE-SPECIFIC AG, SERUM (LABCORP): Prostate Specific Ag, Serum: <0.1 ng/mL (ref 0.0–4.0)

## 2023-03-20 LAB — CANCER ANTIGEN 19-9: CA 19-9: 208 U/mL — ABNORMAL HIGH (ref 0–35)

## 2023-03-22 DIAGNOSIS — R2689 Other abnormalities of gait and mobility: Secondary | ICD-10-CM | POA: Diagnosis not present

## 2023-03-22 DIAGNOSIS — S72001S Fracture of unspecified part of neck of right femur, sequela: Secondary | ICD-10-CM | POA: Diagnosis not present

## 2023-03-22 DIAGNOSIS — M62551 Muscle wasting and atrophy, not elsewhere classified, right thigh: Secondary | ICD-10-CM | POA: Diagnosis not present

## 2023-03-22 DIAGNOSIS — R278 Other lack of coordination: Secondary | ICD-10-CM | POA: Diagnosis not present

## 2023-03-22 DIAGNOSIS — M25551 Pain in right hip: Secondary | ICD-10-CM | POA: Diagnosis not present

## 2023-03-22 DIAGNOSIS — R2681 Unsteadiness on feet: Secondary | ICD-10-CM | POA: Diagnosis not present

## 2023-03-23 DIAGNOSIS — M62551 Muscle wasting and atrophy, not elsewhere classified, right thigh: Secondary | ICD-10-CM | POA: Diagnosis not present

## 2023-03-23 DIAGNOSIS — M25551 Pain in right hip: Secondary | ICD-10-CM | POA: Diagnosis not present

## 2023-03-23 DIAGNOSIS — S72001S Fracture of unspecified part of neck of right femur, sequela: Secondary | ICD-10-CM | POA: Diagnosis not present

## 2023-03-23 DIAGNOSIS — R2681 Unsteadiness on feet: Secondary | ICD-10-CM | POA: Diagnosis not present

## 2023-03-23 DIAGNOSIS — R278 Other lack of coordination: Secondary | ICD-10-CM | POA: Diagnosis not present

## 2023-03-23 DIAGNOSIS — R2689 Other abnormalities of gait and mobility: Secondary | ICD-10-CM | POA: Diagnosis not present

## 2023-03-24 DIAGNOSIS — R2681 Unsteadiness on feet: Secondary | ICD-10-CM | POA: Diagnosis not present

## 2023-03-24 DIAGNOSIS — R2689 Other abnormalities of gait and mobility: Secondary | ICD-10-CM | POA: Diagnosis not present

## 2023-03-24 DIAGNOSIS — M25551 Pain in right hip: Secondary | ICD-10-CM | POA: Diagnosis not present

## 2023-03-24 DIAGNOSIS — R278 Other lack of coordination: Secondary | ICD-10-CM | POA: Diagnosis not present

## 2023-03-24 DIAGNOSIS — S72001S Fracture of unspecified part of neck of right femur, sequela: Secondary | ICD-10-CM | POA: Diagnosis not present

## 2023-03-24 DIAGNOSIS — M62551 Muscle wasting and atrophy, not elsewhere classified, right thigh: Secondary | ICD-10-CM | POA: Diagnosis not present

## 2023-03-25 DIAGNOSIS — S72001S Fracture of unspecified part of neck of right femur, sequela: Secondary | ICD-10-CM | POA: Diagnosis not present

## 2023-03-25 DIAGNOSIS — M62551 Muscle wasting and atrophy, not elsewhere classified, right thigh: Secondary | ICD-10-CM | POA: Diagnosis not present

## 2023-03-25 DIAGNOSIS — R2681 Unsteadiness on feet: Secondary | ICD-10-CM | POA: Diagnosis not present

## 2023-03-25 DIAGNOSIS — M25551 Pain in right hip: Secondary | ICD-10-CM | POA: Diagnosis not present

## 2023-03-25 DIAGNOSIS — R278 Other lack of coordination: Secondary | ICD-10-CM | POA: Diagnosis not present

## 2023-03-25 DIAGNOSIS — R2689 Other abnormalities of gait and mobility: Secondary | ICD-10-CM | POA: Diagnosis not present

## 2023-03-29 ENCOUNTER — Ambulatory Visit: Payer: Medicare Other | Admitting: Cardiology

## 2023-03-29 DIAGNOSIS — R278 Other lack of coordination: Secondary | ICD-10-CM | POA: Diagnosis not present

## 2023-03-29 DIAGNOSIS — M62551 Muscle wasting and atrophy, not elsewhere classified, right thigh: Secondary | ICD-10-CM | POA: Diagnosis not present

## 2023-03-29 DIAGNOSIS — M25551 Pain in right hip: Secondary | ICD-10-CM | POA: Diagnosis not present

## 2023-03-29 DIAGNOSIS — S72001S Fracture of unspecified part of neck of right femur, sequela: Secondary | ICD-10-CM | POA: Diagnosis not present

## 2023-03-29 DIAGNOSIS — R2681 Unsteadiness on feet: Secondary | ICD-10-CM | POA: Diagnosis not present

## 2023-03-29 DIAGNOSIS — R2689 Other abnormalities of gait and mobility: Secondary | ICD-10-CM | POA: Diagnosis not present

## 2023-03-31 DIAGNOSIS — M25551 Pain in right hip: Secondary | ICD-10-CM | POA: Diagnosis not present

## 2023-03-31 DIAGNOSIS — R278 Other lack of coordination: Secondary | ICD-10-CM | POA: Diagnosis not present

## 2023-03-31 DIAGNOSIS — R2689 Other abnormalities of gait and mobility: Secondary | ICD-10-CM | POA: Diagnosis not present

## 2023-03-31 DIAGNOSIS — S72001S Fracture of unspecified part of neck of right femur, sequela: Secondary | ICD-10-CM | POA: Diagnosis not present

## 2023-03-31 DIAGNOSIS — R2681 Unsteadiness on feet: Secondary | ICD-10-CM | POA: Diagnosis not present

## 2023-03-31 DIAGNOSIS — M62551 Muscle wasting and atrophy, not elsewhere classified, right thigh: Secondary | ICD-10-CM | POA: Diagnosis not present

## 2023-04-01 DIAGNOSIS — R278 Other lack of coordination: Secondary | ICD-10-CM | POA: Diagnosis not present

## 2023-04-01 DIAGNOSIS — R2689 Other abnormalities of gait and mobility: Secondary | ICD-10-CM | POA: Diagnosis not present

## 2023-04-01 DIAGNOSIS — R2681 Unsteadiness on feet: Secondary | ICD-10-CM | POA: Diagnosis not present

## 2023-04-01 DIAGNOSIS — M25551 Pain in right hip: Secondary | ICD-10-CM | POA: Diagnosis not present

## 2023-04-01 DIAGNOSIS — S72001S Fracture of unspecified part of neck of right femur, sequela: Secondary | ICD-10-CM | POA: Diagnosis not present

## 2023-04-01 DIAGNOSIS — M62551 Muscle wasting and atrophy, not elsewhere classified, right thigh: Secondary | ICD-10-CM | POA: Diagnosis not present

## 2023-04-02 DIAGNOSIS — M25551 Pain in right hip: Secondary | ICD-10-CM | POA: Diagnosis not present

## 2023-04-02 DIAGNOSIS — R2689 Other abnormalities of gait and mobility: Secondary | ICD-10-CM | POA: Diagnosis not present

## 2023-04-02 DIAGNOSIS — S72001S Fracture of unspecified part of neck of right femur, sequela: Secondary | ICD-10-CM | POA: Diagnosis not present

## 2023-04-02 DIAGNOSIS — R278 Other lack of coordination: Secondary | ICD-10-CM | POA: Diagnosis not present

## 2023-04-02 DIAGNOSIS — R2681 Unsteadiness on feet: Secondary | ICD-10-CM | POA: Diagnosis not present

## 2023-04-02 DIAGNOSIS — M62551 Muscle wasting and atrophy, not elsewhere classified, right thigh: Secondary | ICD-10-CM | POA: Diagnosis not present

## 2023-04-06 ENCOUNTER — Ambulatory Visit: Payer: Medicare Other | Admitting: Cardiology

## 2023-04-06 ENCOUNTER — Encounter: Payer: Self-pay | Admitting: Cardiology

## 2023-04-06 VITALS — BP 102/57 | HR 52 | Resp 16 | Ht 65.0 in | Wt 153.2 lb

## 2023-04-06 DIAGNOSIS — R2681 Unsteadiness on feet: Secondary | ICD-10-CM | POA: Diagnosis not present

## 2023-04-06 DIAGNOSIS — I700 Atherosclerosis of aorta: Secondary | ICD-10-CM

## 2023-04-06 DIAGNOSIS — Z7901 Long term (current) use of anticoagulants: Secondary | ICD-10-CM | POA: Diagnosis not present

## 2023-04-06 DIAGNOSIS — M25551 Pain in right hip: Secondary | ICD-10-CM | POA: Diagnosis not present

## 2023-04-06 DIAGNOSIS — R278 Other lack of coordination: Secondary | ICD-10-CM | POA: Diagnosis not present

## 2023-04-06 DIAGNOSIS — M62551 Muscle wasting and atrophy, not elsewhere classified, right thigh: Secondary | ICD-10-CM | POA: Diagnosis not present

## 2023-04-06 DIAGNOSIS — E782 Mixed hyperlipidemia: Secondary | ICD-10-CM | POA: Diagnosis not present

## 2023-04-06 DIAGNOSIS — I4891 Unspecified atrial fibrillation: Secondary | ICD-10-CM | POA: Diagnosis not present

## 2023-04-06 DIAGNOSIS — G4733 Obstructive sleep apnea (adult) (pediatric): Secondary | ICD-10-CM | POA: Diagnosis not present

## 2023-04-06 DIAGNOSIS — S72001S Fracture of unspecified part of neck of right femur, sequela: Secondary | ICD-10-CM | POA: Diagnosis not present

## 2023-04-06 DIAGNOSIS — R2689 Other abnormalities of gait and mobility: Secondary | ICD-10-CM | POA: Diagnosis not present

## 2023-04-06 DIAGNOSIS — I4892 Unspecified atrial flutter: Secondary | ICD-10-CM | POA: Diagnosis not present

## 2023-04-06 NOTE — Progress Notes (Signed)
Date:  04/06/2023   ID:  [name] [name], DOB 12-07-1940, MRN [id number]  PCP:  [name] Ishihara, MD  Cardiologist:  [name] Lerner, DO, [facility] (established care 04/10/2021) Former Cardiologist: [name] Millard, MD  Date: 04/06/23 Last Office Visit: 01/27/2023  Chief Complaint  Patient presents with   Atrial fibrillation and flutter [name])    HPI  [name] [name] is a 82 y.o. 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  Patient is being followed by the practice given his history of paroxysmal atrial fibrillation/flutter.  He was last seen in the office in May 2024 and since then he has done well from a cardiovascular standpoint.  He remains in sinus rhythm.  He denies anginal chest pain or heart failure symptoms.  He is tolerating anticoagulation well and does not endorse evidence of bleeding.  Does continue to take diltiazem as prescribed but at times has to hold it given his soft systolic blood pressures.  He is accompanied by her daughter [name] [name] today's office visit who provides collateral history.  ALLERGIES: Allergies  Allergen Reactions   Influenza Vaccines Anaphylaxis    Flu shot: 1974 anaphylaxis. 2nd time swollen arm   Influenza Virus Vaccine Other (See Comments)    MEDICATION LIST PRIOR TO VISIT: Current Meds  Medication Sig   acetaminophen (TYLENOL) 500 MG tablet Take 1 tablet (500 mg total) by mouth every 6 (six) hours as needed for moderate pain or mild pain.   bisacodyl (DULCOLAX) 10 MG suppository Place 1 suppository (10 mg total) rectally daily as needed for moderate constipation.   carbidopa-levodopa (SINEMET CR) 50-200 MG tablet TAKE 1 TABLET AT BEDTIME   carbidopa-levodopa (SINEMET IR)  25-100 MG tablet Take 3 tablets at 7 AM, 2 tablets at 11 AM, 3 tablets at 3 PM, 2 tablets at 7 PM   clonazePAM (KLONOPIN) 0.5 MG tablet Take 1 tablet (0.5 mg total) by mouth at bedtime.   cyanocobalamin (VITAMIN B12) 1000 MCG tablet Take 1,000 mcg by mouth daily.   diltiazem (CARDIZEM SR) 90 MG 12 hr capsule Take 1 capsule (90 mg total) by mouth 2 (two) times daily. Hold if systolic blood pressure (top number) less than 110 mmHg or pulse less than 55 bpm.   ELIQUIS 5 MG TABS tablet TAKE 1 TABLET TWICE A DAY   Glycerin-Hypromellose-PEG 400 (VISINE DRY EYE OP) Place 1 drop into both eyes 2 (two) times daily as needed (eye irritation).    ketoconazole (NIZORAL) 2 % shampoo Apply 1 application  topically 3 (three) times a week.   leuprolide, 6 Month, (ELIGARD) 45 MG injection Inject 45 mg into the skin every 6 (six) months.   omeprazole (PRILOSEC) 20 MG capsule TAKE 1 CAPSULE DAILY   ondansetron (ZOFRAN) 4 MG tablet Take 1 tablet (4 mg total) by mouth every 6 (six) hours as needed for nausea.   oxyCODONE (OXY IR/ROXICODONE) 5 MG immediate release tablet Take 1 tablet (5 mg total) by mouth every 6 (six) hours as needed for severe pain.   PANCREAZE 37000-97300 units CPEP TAKE 2 CAPSULES WITH BREAKFAST, LUNCH, AND EVENING MEAL (WITH EACH MEAL) AND 1 CAPSULE WITH SNACK   polyethylene glycol powder (MIRALAX) 17 GM/SCOOP powder Take 17 g by mouth in the morning and at bedtime.   rosuvastatin (CRESTOR) 10 MG tablet  TAKE 1 TABLET DAILY   tiZANidine (ZANAFLEX) 2 MG tablet Take 1 tablet (2 mg total) by mouth every 8 (eight) hours as needed for muscle spasms.   triamcinolone cream (KENALOG) 0.1 % Apply 1 application topically 2 (two) times daily. (Patient taking differently: Apply 1 application  topically 2 (two) times daily as needed (itching).)   zoledronic acid (RECLAST) 5 MG/100ML SOLN injection Inject 5 mg into the vein See admin instructions. 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)    Prostate cancer metastatic to bone (HCC)    REM sleep behavior disorder    Sleep apnea    BiPap    PAST SURGICAL HISTORY: Past Surgical History:  Procedure Laterality Date   ANTERIOR APPROACH HEMI HIP ARTHROPLASTY Right 02/05/2023   Procedure: ANTERIOR APPROACH HEMI HIP ARTHROPLASTY;  Surgeon: [name] Geralds, MD;  Location: [facility] OR;  Service: Orthopedics;  Laterality: Right;   APPENDECTOMY  1963   BIOPSY  12/25/2021   Procedure: BIOPSY;  Surgeon: [name] [name]., MD;  Location: [facility] ENDOSCOPY;  Service: Gastroenterology;;   COLONOSCOPY  2010, 2015   ESOPHAGOGASTRODUODENOSCOPY N/A 04/16/2022   Procedure: ESOPHAGOGASTRODUODENOSCOPY (EGD);  Surgeon: [name] [name]., MD;  Location: [facility] ENDOSCOPY;  Service: Gastroenterology;  Laterality: N/A;   ESOPHAGOGASTRODUODENOSCOPY (EGD) WITH PROPOFOL N/A 12/25/2021   Procedure: ESOPHAGOGASTRODUODENOSCOPY (EGD) WITH PROPOFOL;  Surgeon: [name] Score Netty Starring., MD;  Location: [facility] ENDOSCOPY;  Service: Gastroenterology;  Laterality: N/A;   EUS N/A 12/25/2021   Procedure: UPPER ENDOSCOPIC ULTRASOUND (EUS) RADIAL;  Surgeon: [name] [name]., MD;  Location: [facility] ENDOSCOPY;  Service: Gastroenterology;  Laterality: N/A;   EUS N/A 04/16/2022   Procedure: UPPER ENDOSCOPIC ULTRASOUND (EUS) RADIAL;  Surgeon: [name] [name]., MD;  Location: [facility] ENDOSCOPY;  Service: Gastroenterology;  Laterality: N/A;   FIDUCIAL MARKER PLACEMENT N/A 04/16/2022   Procedure: FIDUCIAL MARKER PLACEMENT;  Surgeon: [name] [name]., MD;  Location: [facility] ENDOSCOPY;  Service: Gastroenterology;  Laterality: N/A;   FINE NEEDLE ASPIRATION  12/25/2021   Procedure: FINE NEEDLE ASPIRATION (FNA) LINEAR;  Surgeon: [name] [name]., MD;  Location: [facility] ENDOSCOPY;  Service: Gastroenterology;;   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   KYPHOPLASTY N/A 08/15/2020   Procedure: L2 compression fracture;  Surgeon: Kennedy Bucker, MD;  Location: [facility] ORS;  Service: Orthopedics;  Laterality: N/A;   KYPHOPLASTY N/A 08/29/2020   Procedure: T8 KYPHOPLASTY;  Surgeon: Kennedy Bucker, MD;  Location: [facility] ORS;  Service: Orthopedics;  Laterality: N/A;   KYPHOPLASTY N/A 11/12/2020   Procedure: L1 KYPHOPLASTY;  Surgeon: Kennedy Bucker, MD;  Location: [facility] ORS;  Service: Orthopedics;  Laterality: N/A;   PORTACATH PLACEMENT N/A 02/05/2022   Procedure: INSERTION PORT-A-CATH;  Surgeon: Fritzi Mandes, MD;  Location: [facility] ORS;  Service: General;  Laterality: N/A;    FAMILY HISTORY: The patient family history includes Breast cancer in his daughter; Diabetes in his paternal grandfather; Heart disease in his maternal grandfather.  SOCIAL HISTORY:  The patient  reports that he quit smoking about 12 years ago. His smoking use included cigars. He has never used smokeless tobacco. He reports current alcohol use. He reports that he does not use drugs.  REVIEW OF SYSTEMS: Review of Systems  Constitutional: Positive  for malaise/fatigue.  Cardiovascular:  Negative for chest pain, claudication, dyspnea on exertion, irregular heartbeat, leg swelling, near-syncope, orthopnea, palpitations, paroxysmal nocturnal dyspnea and syncope.  Respiratory:  Negative for shortness of breath.   Hematologic/Lymphatic: Negative for bleeding problem.  Musculoskeletal:  Positive for back pain. Negative for muscle cramps and myalgias.  Neurological:  Negative for dizziness and light-headedness.    PHYSICAL EXAM:    04/06/2023    3:33 PM 03/18/2023   11:23 AM 02/09/2023    8:25 AM  Vitals with BMI  Height 5\' 5"  5\' 5"    Weight 153 lbs 3 oz 153 lbs 8 oz   BMI 25.49 25.54   Systolic 102 115 93  Diastolic 57 66 70  Pulse 52 55 97   Physical Exam  Constitutional: No distress. He  appears chronically ill.   hemodynamically stable, ambulates w/ walker  Neck: No JVD present.  Cardiovascular: Regular rhythm, S1 normal, S2 normal, intact distal pulses and normal pulses. Bradycardia present. Exam reveals no gallop, no S3 and no S4.  Murmur heard. Holosystolic murmur is present with a grade of 3/6 at the apex. Pulses:      Dorsalis pedis pulses are 2+ on the right side and 2+ on the left side.       Posterior tibial pulses are 2+ on the right side and 2+ on the left side.  Pulmonary/Chest: Effort normal and breath sounds normal. No stridor. He has no wheezes. He has no rales.  Abdominal: Soft. Bowel sounds are normal. He exhibits no distension. There is no abdominal tenderness.  Musculoskeletal:        General: No edema.     Cervical back: Neck supple.  Neurological: He is alert and oriented to person, place, and time. He has intact cranial nerves (2-12).  Skin: Skin is warm and moist.    CARDIAC DATABASE: Cardioversion 01/18/2023: 150J x1  in ED   EKG: 10/26/2022: Atrial fibrillation, 88 bpm, RBBB, left axis.   11/20/2022: Sinus bradycardia, 52 bpm, compared to 10/26/2022 atrial fibrillation has now transition to sinus. April 06, 2023: Sinus bradycardia, 50 bpm, TWI in the inferior and lateral leads consider ischemia.  Similar findings on prior EKG 11/20/2022  Echocardiogram: 04/30/2021: Normal LV systolic function with EF 63%. Moderate concentric hypertrophy of the left ventricle. Normal global wall motion. Left ventricle cavity is normal in size.  Doppler evidence of grade II (pseudonormal) diastolic dysfunction, elevated LAP.  Left atrial cavity is moderately dilated. Trileaflet aortic valve. Moderate (Grade II) aortic regurgitation. Mild to moderate mitral regurgitation. Mild tricuspid regurgitation.  No evidence of pulmonary hypertension.   Stress Testing: Lexiscan Tetrofosmin stress test 04/30/2021: 1 Day Rest/Stress Protocol. Stress EKG is non-diagnostic for  ischemia as its a pharmacologic stress test using Lexiscan. Normal myocardial perfusion without convincing evidence of reversible myocardial ischemia or prior infarct.   Left ventricular ejection fraction is 55% with normal wall motion.   Low risk study. No prior studies for comparison.   Heart Catheterization: None  Cardiac monitor [facility] Patch): December 23, 2022 -Jan 06, 2023 Dominant rhythm sinus, followed by atrial fibrillation/flutter (burden 7%). Overall heart rate 36-179 bpm. Avg HR 62 bpm. Sinus heart rate 36 - 119 bpm. Avg HR 58 bpm. No atrial fibrillation, high grade AV block, pauses (3 seconds or longer). Total ventricular ectopic burden <1%. 1 episode of NSVT (auto triggered), on 12/24/2022 at 6:19 pm, 5 beats, 2.6 seconds duration, max HR 126 bpm.  Total supraventricular ectopic burden 4% (predominantly as isolated beats).  Atrial fibrillation/flutter burden 7% during the monitoring period.  Majority of the episodes were atrial flutter.  Fastest episode occurred on 01/02/2023 at 10:52 am max HR 179 bpm.  Longest episode of atrial flutter 22hr 56 min on 01/02/2023.  Patient triggered events: 3. Underlying rhythm sinus with occasional PACs.   LABORATORY DATA:    Latest Ref Rng & Units 03/18/2023   10:59 AM 02/09/2023    9:27 AM 02/08/2023    9:34 AM  CBC  WBC 4.0 - 10.5 K/uL 6.4  8.2  8.3   Hemoglobin 13.0 - 17.0 g/dL 86.5  78.4  69.6   Hematocrit 39.0 - 52.0 % 37.4  33.9  35.0   Platelets 150 - 400 K/uL 251  238  211        Latest Ref Rng & Units 03/18/2023   10:59 AM 02/09/2023    9:27 AM 02/08/2023    9:34 AM  CMP  Glucose 70 - 99 mg/dL 295  284  132   BUN 8 - 23 mg/dL 26  20  20    Creatinine 0.61 - 1.24 mg/dL 4.40  1.02  7.25   Sodium 135 - 145 mmol/L 139  135  133   Potassium 3.5 - 5.1 mmol/L 4.4  3.8  3.9   Chloride 98 - 111 mmol/L 106  106  103   CO2 22 - 32 mmol/L 26  24  21    Calcium 8.9 - 10.3 mg/dL 36.6  8.6  8.4   Total Protein 6.5 - 8.1 g/dL 7.5  6.0  5.9    Total Bilirubin 0.3 - 1.2 mg/dL 0.8  1.3  1.4   Alkaline Phos 38 - 126 U/L 78  71  52   AST 15 - 41 U/L 25  141  74   ALT 0 - 44 U/L 5  11  8      Lipid Panel  Lab Results  Component Value Date   CHOL 124 12/04/2021   HDL 66 12/04/2021   LDLCALC 34 12/04/2021   LDLDIRECT 43 12/04/2021   TRIG 146 12/04/2021   CHOLHDL 3.0 08/17/2019    No components found for: "NTPROBNP" No results for input(s): "PROBNP" in the last 8760 hours. No results for input(s): "TSH" in the last 8760 hours.  BMP Recent Labs    02/08/23 0934 02/09/23 0927 03/18/23 1059  NA 133* 135 139  K 3.9 3.8 4.4  CL 103 106 106  CO2 21* 24 26  GLUCOSE 112* 128* 108*  BUN 20 20 26*  CREATININE 1.09 1.11 0.99  CALCIUM 8.4* 8.6* 10.1  GFRNONAA >60 >60 >60    HEMOGLOBIN A1C No results found for: "HGBA1C", "MPG"  IMPRESSION:    ICD-10-CM   1. Atrial fibrillation and flutter (HCC)  I48.91 EKG 12-Lead   I48.92     2. Long term (current) use of anticoagulants  Z79.01     3. Atherosclerosis of aorta (HCC)  I70.0     4. OSA treated with BiPAP  G47.33     5. Mixed hyperlipidemia  E78.2        RECOMMENDATIONS: RADEN PARRA is a 82 y.o. 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  Paroxysmal atrial flutter/fibrillation Predominantly atrial flutter Rate control: Diltiazem. Rhythm control: N/A. Thromboembolic prophylaxis: Eliquis [id number] SCORE  is 3 which correlates to 3% risk of stroke per year (age, atherosclerosis).  Diagnosed in 2020. Status post cardioversion x 1 in May 2024 by ED provider. Zio patch notes atrial fibrillation/flutter burden to be around 7%. No recurrence of A-fib with RVR since last office visit. Continue current medical  therapy.  Long term (current) use of anticoagulants Does not endorse evidence of bleeding. Risks, benefits, and alternatives to anticoagulation discussed. Recent labs from July 2024 independently reviewed-renal function and hemoglobin relatively at baseline. Patient and daughter are aware that if his weight is less than 132 pounds dose of Eliquis will need to be reduced.  Atherosclerosis of aorta (HCC) Continue statin therapy.   Prostate cancer (HCC) Malignant neoplasm of pancreas, unspecified location of malignancy [facility]) Currently being followed by other providers and care team   FINAL MEDICATION LIST END OF ENCOUNTER: No orders of the defined types were placed in this encounter.  Medications Discontinued During This Encounter  Medication Reason   prochlorperazine (COMPAZINE) 10 MG tablet    senna-docusate (SENOKOT-S) 8.6-50 MG tablet      Current Outpatient Medications:    acetaminophen (TYLENOL) 500 MG tablet, Take 1 tablet (500 mg total) by mouth every 6 (six) hours as needed for moderate pain or mild pain., Disp: 30 tablet, Rfl: 0   bisacodyl (DULCOLAX) 10 MG suppository, Place 1 suppository (10 mg total) rectally daily as needed for moderate constipation., Disp: 12 suppository, Rfl: 0   carbidopa-levodopa (SINEMET CR) 50-200 MG tablet, TAKE 1 TABLET AT BEDTIME, Disp: 90 tablet, Rfl: 0   carbidopa-levodopa (SINEMET IR) 25-100 MG tablet, Take 3 tablets at 7 AM, 2 tablets at 11 AM, 3 tablets at 3 PM, 2 tablets at 7 PM, Disp: 900 tablet, Rfl: 0   clonazePAM (KLONOPIN) 0.5 MG tablet, Take 1 tablet (0.5 mg total) by mouth at bedtime., Disp: 5 tablet, Rfl: 0   cyanocobalamin (VITAMIN B12) 1000 MCG tablet, Take 1,000 mcg by mouth daily., Disp: , Rfl:    diltiazem (CARDIZEM SR) 90 MG 12 hr capsule, Take 1 capsule (90 mg total) by mouth 2 (two) times daily. Hold if systolic blood pressure (top number) less than 110 mmHg or pulse less than 55 bpm., Disp: 60 capsule, Rfl: 0   ELIQUIS 5  MG TABS tablet, TAKE 1 TABLET TWICE A DAY, Disp: 120 tablet, Rfl: 5   Glycerin-Hypromellose-PEG 400 (VISINE DRY EYE OP), Place 1 drop into both eyes 2 (two) times daily as needed (eye irritation). , Disp: , Rfl:    ketoconazole (NIZORAL) 2 % shampoo, Apply 1 application  topically 3 (three) times a week., Disp: , Rfl:    leuprolide, 6 Month, (ELIGARD) 45 MG injection, Inject 45 mg into the skin every 6 (six) months., Disp: , Rfl:    omeprazole (PRILOSEC) 20 MG capsule, TAKE 1 CAPSULE DAILY, Disp: 90 capsule, Rfl: 3   ondansetron (ZOFRAN) 4 MG tablet, Take 1 tablet (4 mg total) by mouth every 6 (six) hours as needed for nausea., Disp: 20 tablet, Rfl: 0   oxyCODONE (OXY IR/ROXICODONE) 5 MG immediate release tablet, Take 1 tablet (5 mg total) by mouth every 6 (six) hours as needed for severe pain., Disp: 30 tablet, Rfl: 0   PANCREAZE 37000-97300 units CPEP, TAKE 2 CAPSULES WITH BREAKFAST, LUNCH, AND EVENING MEAL (WITH EACH MEAL) AND 1 CAPSULE WITH SNACK, Disp: 600 capsule, Rfl: 5   polyethylene glycol powder (MIRALAX) 17 GM/SCOOP powder, Take 17 g by mouth in the morning and at bedtime., Disp: ,  Rfl:    rosuvastatin (CRESTOR) 10 MG tablet, TAKE 1 TABLET DAILY, Disp: 90 tablet, Rfl: 3   tiZANidine (ZANAFLEX) 2 MG tablet, Take 1 tablet (2 mg total) by mouth every 8 (eight) hours as needed for muscle spasms., Disp: 10 tablet, Rfl: 0   triamcinolone cream (KENALOG) 0.1 %, Apply 1 application topically 2 (two) times daily. (Patient taking differently: Apply 1 application  topically 2 (two) times daily as needed (itching).), Disp: 453.6 g, Rfl: 1   zoledronic acid (RECLAST) 5 MG/100ML SOLN injection, Inject 5 mg into the vein See admin instructions. Once a year, Disp: , Rfl:   Orders Placed This Encounter  Procedures   EKG 12-Lead   There are no Patient Instructions on file for this visit.   --Continue cardiac medications as reconciled in final medication list. --Return in about 6 months (around  10/07/2023) for Follow up Afib/AFL. Or sooner if needed. --Continue follow-up with your primary care physician regarding the management of your other chronic comorbid conditions.  Patient's questions and concerns were addressed to his satisfaction. He voices understanding of the instructions provided during this encounter.   This note was created using a voice recognition software as a result there may be grammatical errors inadvertently enclosed that do not reflect the nature of this encounter. Every attempt is made to correct such errors.  [name] [name], [place], [facility]  Pager:  [phone] Office: [phone]

## 2023-04-07 DIAGNOSIS — R2689 Other abnormalities of gait and mobility: Secondary | ICD-10-CM | POA: Diagnosis not present

## 2023-04-07 DIAGNOSIS — M62551 Muscle wasting and atrophy, not elsewhere classified, right thigh: Secondary | ICD-10-CM | POA: Diagnosis not present

## 2023-04-07 DIAGNOSIS — S72001S Fracture of unspecified part of neck of right femur, sequela: Secondary | ICD-10-CM | POA: Diagnosis not present

## 2023-04-07 DIAGNOSIS — R278 Other lack of coordination: Secondary | ICD-10-CM | POA: Diagnosis not present

## 2023-04-07 DIAGNOSIS — M25551 Pain in right hip: Secondary | ICD-10-CM | POA: Diagnosis not present

## 2023-04-07 DIAGNOSIS — R2681 Unsteadiness on feet: Secondary | ICD-10-CM | POA: Diagnosis not present

## 2023-04-09 DIAGNOSIS — S72001S Fracture of unspecified part of neck of right femur, sequela: Secondary | ICD-10-CM | POA: Diagnosis not present

## 2023-04-09 DIAGNOSIS — R2689 Other abnormalities of gait and mobility: Secondary | ICD-10-CM | POA: Diagnosis not present

## 2023-04-09 DIAGNOSIS — R278 Other lack of coordination: Secondary | ICD-10-CM | POA: Diagnosis not present

## 2023-04-09 DIAGNOSIS — R2681 Unsteadiness on feet: Secondary | ICD-10-CM | POA: Diagnosis not present

## 2023-04-09 DIAGNOSIS — M62551 Muscle wasting and atrophy, not elsewhere classified, right thigh: Secondary | ICD-10-CM | POA: Diagnosis not present

## 2023-04-09 DIAGNOSIS — M25551 Pain in right hip: Secondary | ICD-10-CM | POA: Diagnosis not present

## 2023-04-09 DIAGNOSIS — Z96641 Presence of right artificial hip joint: Secondary | ICD-10-CM | POA: Diagnosis not present

## 2023-04-13 ENCOUNTER — Telehealth: Payer: Self-pay | Admitting: Nurse Practitioner

## 2023-04-13 DIAGNOSIS — R278 Other lack of coordination: Secondary | ICD-10-CM | POA: Diagnosis not present

## 2023-04-13 DIAGNOSIS — R2689 Other abnormalities of gait and mobility: Secondary | ICD-10-CM | POA: Diagnosis not present

## 2023-04-13 DIAGNOSIS — M62551 Muscle wasting and atrophy, not elsewhere classified, right thigh: Secondary | ICD-10-CM | POA: Diagnosis not present

## 2023-04-13 DIAGNOSIS — M25551 Pain in right hip: Secondary | ICD-10-CM | POA: Diagnosis not present

## 2023-04-13 DIAGNOSIS — S72001S Fracture of unspecified part of neck of right femur, sequela: Secondary | ICD-10-CM | POA: Diagnosis not present

## 2023-04-13 DIAGNOSIS — R2681 Unsteadiness on feet: Secondary | ICD-10-CM | POA: Diagnosis not present

## 2023-04-13 NOTE — Telephone Encounter (Signed)
Inbound call from patients daughter requesting an appointment for patient. Daughter stated that he is having a lot of abdominal discomfort and feels like he has to use the bathroom but does not have a bowel movement. Patient was scheduled for 10/22 at 1:30 with [facility] and is requesting a call to discuss symptoms and to see if he can be seen sooner. Please advise.

## 2023-04-14 DIAGNOSIS — M25551 Pain in right hip: Secondary | ICD-10-CM | POA: Diagnosis not present

## 2023-04-14 DIAGNOSIS — R2689 Other abnormalities of gait and mobility: Secondary | ICD-10-CM | POA: Diagnosis not present

## 2023-04-14 DIAGNOSIS — S72001S Fracture of unspecified part of neck of right femur, sequela: Secondary | ICD-10-CM | POA: Diagnosis not present

## 2023-04-14 DIAGNOSIS — R2681 Unsteadiness on feet: Secondary | ICD-10-CM | POA: Diagnosis not present

## 2023-04-14 DIAGNOSIS — R278 Other lack of coordination: Secondary | ICD-10-CM | POA: Diagnosis not present

## 2023-04-14 DIAGNOSIS — M62551 Muscle wasting and atrophy, not elsewhere classified, right thigh: Secondary | ICD-10-CM | POA: Diagnosis not present

## 2023-04-14 NOTE — Telephone Encounter (Signed)
Left Message for pt daughter Camie Patience to call us back.

## 2023-04-15 DIAGNOSIS — R2689 Other abnormalities of gait and mobility: Secondary | ICD-10-CM | POA: Diagnosis not present

## 2023-04-15 DIAGNOSIS — S72001S Fracture of unspecified part of neck of right femur, sequela: Secondary | ICD-10-CM | POA: Diagnosis not present

## 2023-04-15 DIAGNOSIS — R2681 Unsteadiness on feet: Secondary | ICD-10-CM | POA: Diagnosis not present

## 2023-04-15 DIAGNOSIS — R278 Other lack of coordination: Secondary | ICD-10-CM | POA: Diagnosis not present

## 2023-04-15 DIAGNOSIS — M25551 Pain in right hip: Secondary | ICD-10-CM | POA: Diagnosis not present

## 2023-04-15 DIAGNOSIS — M62551 Muscle wasting and atrophy, not elsewhere classified, right thigh: Secondary | ICD-10-CM | POA: Diagnosis not present

## 2023-04-15 NOTE — Telephone Encounter (Signed)
Spoke with patient's daughter & she stated patient has been experiencing occasional abdominal discomfort/bloating and constipation for some time now. Taking all his current medications as prescribed that she is aware of. Advised daughter to increase miralax to BID in the meantime. States she's also tried prune juice & castor oil. She's requesting sooner appointment time. There are currently no available appointment until October with Jill Side, NP. Scheduled OV with Jessica for 04/22/23 at 9:00 am.

## 2023-04-19 DIAGNOSIS — M25551 Pain in right hip: Secondary | ICD-10-CM | POA: Diagnosis not present

## 2023-04-19 DIAGNOSIS — R2681 Unsteadiness on feet: Secondary | ICD-10-CM | POA: Diagnosis not present

## 2023-04-19 DIAGNOSIS — R278 Other lack of coordination: Secondary | ICD-10-CM | POA: Diagnosis not present

## 2023-04-19 DIAGNOSIS — M62551 Muscle wasting and atrophy, not elsewhere classified, right thigh: Secondary | ICD-10-CM | POA: Diagnosis not present

## 2023-04-19 DIAGNOSIS — R2689 Other abnormalities of gait and mobility: Secondary | ICD-10-CM | POA: Diagnosis not present

## 2023-04-19 DIAGNOSIS — S72001S Fracture of unspecified part of neck of right femur, sequela: Secondary | ICD-10-CM | POA: Diagnosis not present

## 2023-04-20 ENCOUNTER — Other Ambulatory Visit: Payer: Self-pay

## 2023-04-20 ENCOUNTER — Encounter: Payer: Self-pay | Admitting: Hematology

## 2023-04-20 DIAGNOSIS — S72001S Fracture of unspecified part of neck of right femur, sequela: Secondary | ICD-10-CM | POA: Diagnosis not present

## 2023-04-20 DIAGNOSIS — M62551 Muscle wasting and atrophy, not elsewhere classified, right thigh: Secondary | ICD-10-CM | POA: Diagnosis not present

## 2023-04-20 DIAGNOSIS — R2681 Unsteadiness on feet: Secondary | ICD-10-CM | POA: Diagnosis not present

## 2023-04-20 DIAGNOSIS — M25551 Pain in right hip: Secondary | ICD-10-CM | POA: Diagnosis not present

## 2023-04-20 DIAGNOSIS — R278 Other lack of coordination: Secondary | ICD-10-CM | POA: Diagnosis not present

## 2023-04-20 DIAGNOSIS — R2689 Other abnormalities of gait and mobility: Secondary | ICD-10-CM | POA: Diagnosis not present

## 2023-04-20 MED ORDER — OMEPRAZOLE 20 MG PO CPDR: 20.0000 mg | DELAYED_RELEASE_CAPSULE | Freq: Every day | ORAL | 3 refills | Status: DC

## 2023-04-21 DIAGNOSIS — M25551 Pain in right hip: Secondary | ICD-10-CM | POA: Diagnosis not present

## 2023-04-21 DIAGNOSIS — R278 Other lack of coordination: Secondary | ICD-10-CM | POA: Diagnosis not present

## 2023-04-21 DIAGNOSIS — R2681 Unsteadiness on feet: Secondary | ICD-10-CM | POA: Diagnosis not present

## 2023-04-21 DIAGNOSIS — R2689 Other abnormalities of gait and mobility: Secondary | ICD-10-CM | POA: Diagnosis not present

## 2023-04-21 DIAGNOSIS — S72001S Fracture of unspecified part of neck of right femur, sequela: Secondary | ICD-10-CM | POA: Diagnosis not present

## 2023-04-21 DIAGNOSIS — M62551 Muscle wasting and atrophy, not elsewhere classified, right thigh: Secondary | ICD-10-CM | POA: Diagnosis not present

## 2023-04-22 ENCOUNTER — Ambulatory Visit (INDEPENDENT_AMBULATORY_CARE_PROVIDER_SITE_OTHER): Payer: Medicare Other | Admitting: Gastroenterology

## 2023-04-22 ENCOUNTER — Encounter: Payer: Self-pay | Admitting: Gastroenterology

## 2023-04-22 VITALS — BP 90/60 | HR 57 | Ht 64.0 in | Wt 151.0 lb

## 2023-04-22 DIAGNOSIS — M25561 Pain in right knee: Secondary | ICD-10-CM | POA: Diagnosis not present

## 2023-04-22 DIAGNOSIS — M545 Low back pain, unspecified: Secondary | ICD-10-CM | POA: Diagnosis not present

## 2023-04-22 DIAGNOSIS — K5909 Other constipation: Secondary | ICD-10-CM

## 2023-04-22 NOTE — Progress Notes (Signed)
04/22/2023 IASON DANH [id number] 07/11/1941   HISTORY OF PRESENT ILLNESS: This is an 82 year old male with past medical history of atrial flutter on Eliquis, sleep apnea, Parkinson disease, prostate cancer with bone metastasis and pancreatic cancer diagnosed 09/2021 status post chemo with Gemcitabine on 01/13/22 - 04/01/22 and SBRT 8/28 - 05/14/2022 followed by Dr. Malachy Mood.    Dr. Meridee Score completed an EGD/EUS 4/20/203, pancreatic cytology confirmed adenocarcinoma. A repeat EUS was done 04/16/22 for fiducial marker deployment.    He presents here today with his daughter mostly for complaints of constipation.  Has been using 1 stool softener a day and MiraLAX once daily.  He says that he is not sure if the pancreatic enzymes are really making any difference in his symptoms and is wondering if he really needs to continue them as they do cost $75 every 3 months.   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)    Prostate cancer metastatic to bone (HCC)    REM sleep behavior disorder    Sleep apnea    BiPap   Past Surgical History:  Procedure Laterality Date   ANTERIOR APPROACH HEMI HIP ARTHROPLASTY Right 02/05/2023   Procedure: ANTERIOR APPROACH HEMI HIP ARTHROPLASTY;  Surgeon: Jodi Geralds, MD;  Location: [facility] OR;  Service: Orthopedics;  Laterality: Right;   APPENDECTOMY  1963   BIOPSY  12/25/2021   Procedure: BIOPSY;  Surgeon: Lemar Lofty., MD;  Location: [facility] ENDOSCOPY;  Service: Gastroenterology;;   COLONOSCOPY  2010, 2015   ESOPHAGOGASTRODUODENOSCOPY N/A 04/16/2022   Procedure: ESOPHAGOGASTRODUODENOSCOPY (EGD);  Surgeon: Lemar Lofty., MD;  Location: [facility] ENDOSCOPY;  Service: Gastroenterology;  Laterality: N/A;   ESOPHAGOGASTRODUODENOSCOPY  (EGD) WITH PROPOFOL N/A 12/25/2021   Procedure: ESOPHAGOGASTRODUODENOSCOPY (EGD) WITH PROPOFOL;  Surgeon: Meridee Score Netty Starring., MD;  Location[facility] ENDOSCOPY;  Service: Gastroenterology;  Laterality: N/A;   EUS N/A 12/25/2021   Procedure: UPPER ENDOSCOPIC ULTRASOUND (EUS) RADIAL;  Surgeon: Lemar Lofty., MD;  Location: [facility] ENDOSCOPY;  Service: Gastroenterology;  Laterality: N/A;   EUS N/A 04/16/2022   Procedure: UPPER ENDOSCOPIC ULTRASOUND (EUS) RADIAL;  Surgeon: Lemar Lofty., MD;  Location: [facility] ENDOSCOPY;  Service: Gastroenterology;  Laterality: N/A;   FIDUCIAL MARKER PLACEMENT N/A 04/16/2022   Procedure: FIDUCIAL MARKER PLACEMENT;  Surgeon: Lemar Lofty., MD;  Location: [facility] ENDOSCOPY;  Service: Gastroenterology;  Laterality: N/A;   FINE NEEDLE ASPIRATION  12/25/2021   Procedure: FINE NEEDLE ASPIRATION (FNA) LINEAR;  Surgeon: Lemar Lofty., MD;  Location: [facility] ENDOSCOPY;  Service: Gastroenterology;;   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   KYPHOPLASTY N/A 08/15/2020   Procedure: L2 compression fracture;  Surgeon: Kennedy Bucker, MD;  Location: [facility] ORS;  Service: Orthopedics;  Laterality: N/A;   KYPHOPLASTY N/A 08/29/2020   Procedure: T8 KYPHOPLASTY;  Surgeon: Kennedy Bucker, MD;  Location: [facility] ORS;  Service: Orthopedics;  Laterality: N/A;   KYPHOPLASTY N/A 11/12/2020   Procedure: L1 KYPHOPLASTY;  Surgeon: Kennedy Bucker, MD;  Location: [facility] ORS;  Service: Orthopedics;  Laterality: N/A;   PORTACATH PLACEMENT N/A 02/05/2022   Procedure: INSERTION PORT-A-CATH;  Surgeon: Fritzi Mandes, MD;  Location: [facility] ORS;  Service: General;  Laterality: N/A;    reports that he quit smoking about 12 years ago. His smoking  use included cigars. He has never used smokeless tobacco. He reports current alcohol use. He reports that he does not use drugs. family history includes Breast cancer in his daughter; Diabetes in his paternal grandfather; Heart disease in his maternal grandfather. Allergies   Allergen Reactions   Influenza Vaccines Anaphylaxis    Flu shot: 1974 anaphylaxis. 2nd time swollen arm   Influenza Virus Vaccine Other (See Comments)      Outpatient Encounter Medications as of 04/22/2023  Medication Sig   acetaminophen (TYLENOL) 500 MG tablet Take 1 tablet (500 mg total) by mouth every 6 (six) hours as needed for moderate pain or mild pain.   bisacodyl (DULCOLAX) 10 MG suppository Place 1 suppository (10 mg total) rectally daily as needed for moderate constipation.   carbidopa-levodopa (SINEMET CR) 50-200 MG tablet TAKE 1 TABLET AT BEDTIME   carbidopa-levodopa (SINEMET IR) 25-100 MG tablet Take 3 tablets at 7 AM, 2 tablets at 11 AM, 3 tablets at 3 PM, 2 tablets at 7 PM   clonazePAM (KLONOPIN) 0.5 MG tablet Take 1 tablet (0.5 mg total) by mouth at bedtime.   cyanocobalamin (VITAMIN B12) 1000 MCG tablet Take 1,000 mcg by mouth daily.   ELIQUIS 5 MG TABS tablet TAKE 1 TABLET TWICE A DAY   Glycerin-Hypromellose-PEG 400 (VISINE DRY EYE OP) Place 1 drop into both eyes 2 (two) times daily as needed (eye irritation).    ketoconazole (NIZORAL) 2 % shampoo Apply 1 application  topically 3 (three) times a week.   leuprolide, 6 Month, (ELIGARD) 45 MG injection Inject 45 mg into the skin every 6 (six) months.   omeprazole (PRILOSEC) 20 MG capsule Take 1 capsule (20 mg total) by mouth daily.   ondansetron (ZOFRAN) 4 MG tablet Take 1 tablet (4 mg total) by mouth every 6 (six) hours as needed for nausea.   oxyCODONE (OXY IR/ROXICODONE) 5 MG immediate release tablet Take 1 tablet (5 mg total) by mouth every 6 (six) hours as needed for severe pain.   PANCREAZE 37000-97300 units CPEP TAKE 2 CAPSULES WITH BREAKFAST, LUNCH, AND EVENING MEAL (WITH EACH MEAL) AND 1 CAPSULE WITH SNACK   polyethylene glycol powder (MIRALAX) 17 GM/SCOOP powder Take 17 g by mouth in the morning and at bedtime.   rosuvastatin (CRESTOR) 10 MG tablet TAKE 1 TABLET DAILY   tiZANidine (ZANAFLEX) 2 MG tablet Take 1  tablet (2 mg total) by mouth every 8 (eight) hours as needed for muscle spasms.   triamcinolone cream (KENALOG) 0.1 % Apply 1 application topically 2 (two) times daily. (Patient taking differently: Apply 1 application  topically 2 (two) times daily as needed (itching).)   zoledronic acid (RECLAST) 5 MG/100ML SOLN injection Inject 5 mg into the vein See admin instructions. Once a year   diltiazem (CARDIZEM SR) 90 MG 12 hr capsule Take 1 capsule (90 mg total) by mouth 2 (two) times daily. Hold if systolic blood pressure (top number) less than 110 mmHg or pulse less than 55 bpm.   No facility-administered encounter medications on file as of 04/22/2023.     REVIEW OF SYSTEMS  : All other systems reviewed and negative except where noted in the History of Present Illness.   PHYSICAL EXAM: BP 90/60   Pulse (!) 57   Ht 5\' 4"  (1.626 m)   Wt 151 lb (68.5 kg)   BMI 25.92 kg/m  General: White male in no acute distress; using a walker Head: Normocephalic and atraumatic Eyes:  Sclerae anicteric, conjunctiva pink. Ears: Normal auditory  acuity Lungs: Clear throughout to auscultation; no W/R/R. Heart: Regular rate and rhythm; no M/R/G. Abdomen: Soft, non-distended.  BS present.  Non-tender. Musculoskeletal: Symmetrical with no gross deformities  Skin: No lesions on visible extremities Extremities: No edema  Neurological: Alert oriented x 4, grossly non-focal Psychological:  Alert and cooperative. Normal mood and affect  ASSESSMENT AND PLAN: 41) 82 year old male with pancreatic adeno carcinoma cancer s/p chemo with Gemcitabine on 01/13/22 - 04/01/22 and s/p SBRT 8/28 - 05/14/2022 followed by Dr. Mosetta Putt.  -Continue follow up with Dr. Mosetta Putt    2) Intermittent constipation -MiraLAX bowel purge this evening then will begin taking MiraLAX twice daily.  He will do this for the next 7 to 10 days and see how it is working.  Could always try some samples of low-dose Linzess if needed. -He is not sure if the  pancreatic enzymes are really making a difference in his symptoms.  Was advised he could stop them for a couple of weeks and if no difference he could probably just discontinue them altogether for now as they do cost him $75 every 3 months.   4) History of prostate cancer with bone metastasis   5) Paroxysmal atrial flutter on Eliquis    6) Parkinson's disease      CC:  Lorenda Ishihara,*

## 2023-04-22 NOTE — Progress Notes (Signed)
Attending Physician's Attestation   I have reviewed the chart.   I agree with the Advanced Practitioner's note, impression, and recommendations with any updates as below. Agree with the outlined bowel regimen/purge and if he continues to have issues, trialing some of our samples including Linzess or Trulance or Amitiza may make sense.    Corliss Parish, MD [facility] Advanced [facility]oscopy Office # [phone]

## 2023-04-22 NOTE — Patient Instructions (Addendum)
  BOWEL PURGE:   I am recommending a bowel purge to aid in cleaning out your bowels.    OVER THE COUNTER SHOPPING GUIDE:   Purchase 1 (one) 119 GRAM bottle of Miralax Purchase 1 (one) 32 ounce bottle of Gatorade   STEPS:   Mix the entire bottle of Miralax in the 32 ounces of Gatorade and stir to dissolve completely. After 1 hour, drink the Miralax solution you have prepared. You will drink this mixture over the next 2-3 hours.  You should expect results within 1 to 6 hours after completing this bowel purge.   Start Miralax 1 capful twice daily after completing bowel purge.   Call back with an update in 7-10 days, ask for Patty, RN.  _______________________________________________________  If your blood pressure at your visit was 140/90 or greater, please contact your primary care physician to follow up on this.  _______________________________________________________  If you are age 29 or older, your body mass index should be between 23-30. Your Body mass index is 25.92 kg/m. If this is out of the aforementioned range listed, please consider follow up with your Primary Care Provider.  If you are age 63 or younger, your body mass index should be between 19-25. Your Body mass index is 25.92 kg/m. If this is out of the aformentioned range listed, please consider follow up with your Primary Care Provider.   ________________________________________________________  [facility] providers would like to encourage you to use [facility] to communicate with providers for non-urgent requests or questions.  Due to long hold times on the telephone, sending your provider a message by [facility] may be a faster and more efficient way to get a response.  Please allow 48 business hours for a response.  Please remember that this is for non-urgent requests.  _______________________________________________________

## 2023-04-23 DIAGNOSIS — S72001S Fracture of unspecified part of neck of right femur, sequela: Secondary | ICD-10-CM | POA: Diagnosis not present

## 2023-04-23 DIAGNOSIS — R278 Other lack of coordination: Secondary | ICD-10-CM | POA: Diagnosis not present

## 2023-04-23 DIAGNOSIS — R2681 Unsteadiness on feet: Secondary | ICD-10-CM | POA: Diagnosis not present

## 2023-04-23 DIAGNOSIS — M25551 Pain in right hip: Secondary | ICD-10-CM | POA: Diagnosis not present

## 2023-04-23 DIAGNOSIS — M62551 Muscle wasting and atrophy, not elsewhere classified, right thigh: Secondary | ICD-10-CM | POA: Diagnosis not present

## 2023-04-23 DIAGNOSIS — R2689 Other abnormalities of gait and mobility: Secondary | ICD-10-CM | POA: Diagnosis not present

## 2023-04-27 DIAGNOSIS — M62551 Muscle wasting and atrophy, not elsewhere classified, right thigh: Secondary | ICD-10-CM | POA: Diagnosis not present

## 2023-04-27 DIAGNOSIS — R278 Other lack of coordination: Secondary | ICD-10-CM | POA: Diagnosis not present

## 2023-04-27 DIAGNOSIS — M25551 Pain in right hip: Secondary | ICD-10-CM | POA: Diagnosis not present

## 2023-04-27 DIAGNOSIS — R2689 Other abnormalities of gait and mobility: Secondary | ICD-10-CM | POA: Diagnosis not present

## 2023-04-27 DIAGNOSIS — R2681 Unsteadiness on feet: Secondary | ICD-10-CM | POA: Diagnosis not present

## 2023-04-27 DIAGNOSIS — S72001S Fracture of unspecified part of neck of right femur, sequela: Secondary | ICD-10-CM | POA: Diagnosis not present

## 2023-04-28 ENCOUNTER — Encounter: Payer: Self-pay | Admitting: Neurology

## 2023-04-28 ENCOUNTER — Other Ambulatory Visit: Payer: Self-pay

## 2023-04-28 DIAGNOSIS — G20A1 Parkinson's disease without dyskinesia, without mention of fluctuations: Secondary | ICD-10-CM

## 2023-04-28 MED ORDER — CARBIDOPA-LEVODOPA 25-100 MG PO TABS
ORAL_TABLET | ORAL | 0 refills | Status: DC
Start: 2023-04-28 — End: 2023-08-10

## 2023-04-30 ENCOUNTER — Telehealth: Payer: Self-pay | Admitting: Gastroenterology

## 2023-04-30 DIAGNOSIS — S72001S Fracture of unspecified part of neck of right femur, sequela: Secondary | ICD-10-CM | POA: Diagnosis not present

## 2023-04-30 DIAGNOSIS — R278 Other lack of coordination: Secondary | ICD-10-CM | POA: Diagnosis not present

## 2023-04-30 DIAGNOSIS — M25551 Pain in right hip: Secondary | ICD-10-CM | POA: Diagnosis not present

## 2023-04-30 DIAGNOSIS — M62551 Muscle wasting and atrophy, not elsewhere classified, right thigh: Secondary | ICD-10-CM | POA: Diagnosis not present

## 2023-04-30 DIAGNOSIS — R2681 Unsteadiness on feet: Secondary | ICD-10-CM | POA: Diagnosis not present

## 2023-04-30 DIAGNOSIS — R2689 Other abnormalities of gait and mobility: Secondary | ICD-10-CM | POA: Diagnosis not present

## 2023-04-30 NOTE — Telephone Encounter (Signed)
The pt is calling to update on how his bowel purge went after visit with Shanda Bumps.  He says that he had a really good bowel movement and is titrating the miralax as needed to maintain good bowel habits. He will call if he has any further concerns or questions

## 2023-04-30 NOTE — Telephone Encounter (Signed)
Patient called stated when he saw Doug Sou PA-C she advised him to call you to go over a purge.

## 2023-05-04 DIAGNOSIS — S72001S Fracture of unspecified part of neck of right femur, sequela: Secondary | ICD-10-CM | POA: Diagnosis not present

## 2023-05-04 DIAGNOSIS — M62551 Muscle wasting and atrophy, not elsewhere classified, right thigh: Secondary | ICD-10-CM | POA: Diagnosis not present

## 2023-05-04 DIAGNOSIS — R2689 Other abnormalities of gait and mobility: Secondary | ICD-10-CM | POA: Diagnosis not present

## 2023-05-04 DIAGNOSIS — R2681 Unsteadiness on feet: Secondary | ICD-10-CM | POA: Diagnosis not present

## 2023-05-04 DIAGNOSIS — M25551 Pain in right hip: Secondary | ICD-10-CM | POA: Diagnosis not present

## 2023-05-04 DIAGNOSIS — R278 Other lack of coordination: Secondary | ICD-10-CM | POA: Diagnosis not present

## 2023-05-06 DIAGNOSIS — M25551 Pain in right hip: Secondary | ICD-10-CM | POA: Diagnosis not present

## 2023-05-06 DIAGNOSIS — M62551 Muscle wasting and atrophy, not elsewhere classified, right thigh: Secondary | ICD-10-CM | POA: Diagnosis not present

## 2023-05-06 DIAGNOSIS — R278 Other lack of coordination: Secondary | ICD-10-CM | POA: Diagnosis not present

## 2023-05-06 DIAGNOSIS — S72001S Fracture of unspecified part of neck of right femur, sequela: Secondary | ICD-10-CM | POA: Diagnosis not present

## 2023-05-06 DIAGNOSIS — R2689 Other abnormalities of gait and mobility: Secondary | ICD-10-CM | POA: Diagnosis not present

## 2023-05-06 DIAGNOSIS — R2681 Unsteadiness on feet: Secondary | ICD-10-CM | POA: Diagnosis not present

## 2023-05-08 ENCOUNTER — Emergency Department (HOSPITAL_COMMUNITY): Payer: Medicare Other

## 2023-05-08 ENCOUNTER — Emergency Department (HOSPITAL_COMMUNITY)
Admission: EM | Admit: 2023-05-08 | Discharge: 2023-05-09 | Disposition: A | Discharge status: Home / Self Care | Payer: Medicare Other

## 2023-05-08 ENCOUNTER — Encounter (HOSPITAL_COMMUNITY): Payer: Self-pay

## 2023-05-08 ENCOUNTER — Other Ambulatory Visit: Payer: Self-pay

## 2023-05-08 DIAGNOSIS — R000 Tachycardia, unspecified: Secondary | ICD-10-CM | POA: Diagnosis not present

## 2023-05-08 DIAGNOSIS — Z7901 Long term (current) use of anticoagulants: Secondary | ICD-10-CM | POA: Insufficient documentation

## 2023-05-08 DIAGNOSIS — I4891 Unspecified atrial fibrillation: Secondary | ICD-10-CM | POA: Diagnosis not present

## 2023-05-08 DIAGNOSIS — I959 Hypotension, unspecified: Secondary | ICD-10-CM | POA: Diagnosis not present

## 2023-05-08 DIAGNOSIS — Z5181 Encounter for therapeutic drug level monitoring: Secondary | ICD-10-CM

## 2023-05-08 LAB — CBC
HCT: 38.7 % — ABNORMAL LOW (ref 39.0–52.0)
Hemoglobin: 11.7 g/dL — ABNORMAL LOW (ref 13.0–17.0)
MCH: 28.4 pg (ref 26.0–34.0)
MCHC: 30.2 g/dL (ref 30.0–36.0)
MCV: 93.9 fL (ref 80.0–100.0)
Platelets: 180 10*3/uL (ref 150–400)
RBC: 4.12 MIL/uL — ABNORMAL LOW (ref 4.22–5.81)
RDW: 13.9 % (ref 11.5–15.5)
WBC: 5.6 10*3/uL (ref 4.0–10.5)
nRBC: 0 % (ref 0.0–0.2)

## 2023-05-08 LAB — BASIC METABOLIC PANEL
Anion gap: 10 (ref 5–15)
BUN: 21 mg/dL (ref 8–23)
CO2: 21 mmol/L — ABNORMAL LOW (ref 22–32)
Calcium: 8.7 mg/dL — ABNORMAL LOW (ref 8.9–10.3)
Chloride: 108 mmol/L (ref 98–111)
Creatinine, Ser: 0.98 mg/dL (ref 0.61–1.24)
GFR, Estimated: >60 mL/min (ref >60)
Glucose, Bld: 100 mg/dL — ABNORMAL HIGH (ref 70–99)
Potassium: 3.9 mmol/L (ref 3.5–5.1)
Sodium: 139 mmol/L (ref 135–145)

## 2023-05-08 LAB — MAGNESIUM: Magnesium: 2.1 mg/dL (ref 1.7–2.4)

## 2023-05-08 LAB — TROPONIN I (HIGH SENSITIVITY)
Troponin I (High Sensitivity): 17 ng/L (ref <18)
Troponin I (High Sensitivity): 26 ng/L — ABNORMAL HIGH (ref <18)

## 2023-05-08 LAB — D-DIMER, QUANTITATIVE: D-Dimer, Quant: 0.68 ug{FEU}/mL — ABNORMAL HIGH (ref 0.00–0.50)

## 2023-05-08 MED ORDER — DILTIAZEM HCL 60 MG PO TABS
60.0000 mg | ORAL_TABLET | Freq: Once | ORAL | Status: AC
  Administered 2023-05-08: 60 mg via ORAL
  Filled 2023-05-08: qty 1

## 2023-05-08 MED ORDER — PROPOFOL 10 MG/ML IV BOLUS
20.0000 mg | Freq: Once | INTRAVENOUS | Status: AC
  Administered 2023-05-08: 20 mg via INTRAVENOUS
  Filled 2023-05-08: qty 20

## 2023-05-08 MED ORDER — SODIUM CHLORIDE 0.9 % IV BOLUS
500.0000 mL | Freq: Once | INTRAVENOUS | Status: AC
  Administered 2023-05-08: 500 mL via INTRAVENOUS

## 2023-05-08 MED ORDER — CARBIDOPA-LEVODOPA 25-100 MG PO TABS
3.0000 | ORAL_TABLET | Freq: Once | ORAL | Status: AC
  Administered 2023-05-08: 3 via ORAL
  Filled 2023-05-08: qty 3

## 2023-05-08 NOTE — ED Triage Notes (Signed)
Pt arrived via [facility] from [facility] for heart racing for the past 3 hrs. Per EMS, pt denies chest pain or SOB. Per EMS, pt was found to be in A-fib w/RVR. Per EMS, initial bp was 90/60 and heart rate 150-160. EMS gave NS 250 mL.

## 2023-05-08 NOTE — Progress Notes (Signed)
ON-CALL CARDIOLOGY 05/08/23  Patient's name: [name] [name].   MRN: [id number].    DOB: 01-04-1941 Primary care provider: Lorenda Ishihara, MD. Primary cardiologist: Tessa Lerner, DO, [facility]  Interaction regarding this patient's care today: Presents to the ED w/ cc heart racing.  Noted to be in Afib w/ RVR.   Dr. Maple Hudson reached out to review his case.   Impression: Afib w/ RVR   Recommendations: Per my discussion w/ Dr. Maple Hudson patient was having symptoms of heart racing for couple of hours prior to Ems activation.   He denies chest pain or heart failure symptoms.   Has been compliant w/ OAC and no interruption in [facility] for last 4 weeks.   Patient states that he has not been taking diltiazem 90 mg po bid regularly due to holding parameters.   Since he is symptomatic and has been on [facility] without fail for the last 4 week it is reasonable to proceed with direct current cardioversion. If patient is agreeable.   Otherwise, start on Diltiazem drip for now w/ medicine admit and cardiology to follow.   If DCCV is successful - recommend monitoring him in ED per protocol and d/c to [facility] w/ OAC and diltiazem 60mg  po bid w/ same holding parameters.   Telephone encounter total time: 10 minutes.    Tessa Lerner, [place], [facility]  Pager:  [phone] Office: [phone]

## 2023-05-08 NOTE — Sedation Documentation (Signed)
RT arrived at bedside.

## 2023-05-08 NOTE — Sedation Documentation (Signed)
ED Provider at bedside. 

## 2023-05-09 DIAGNOSIS — I4891 Unspecified atrial fibrillation: Secondary | ICD-10-CM | POA: Diagnosis not present

## 2023-05-09 NOTE — ED Provider Notes (Signed)
Patient signed out pending reassessment after cardioversion.  On reassessment, he is without complaint.  He is able to orally hydrate and ambulate independently.  Recommend follow-up with cardiology.  Continue Eliquis and diltiazem. Physical Exam  BP 127/75   Pulse (!) 107   Temp 98 F (36.7 C) (Oral)   Resp (!) 24   Ht 1.626 m (5\' 4" )   Wt 68.5 kg   SpO2 98%   BMI 25.92 kg/m   Physical Exam Awake, alert, no acute distress Procedures  Procedures  ED Course / MDM   Clinical Course as of 05/09/23 0134  Sat May 08, 2023  1719 Cards visit in July:  "Paroxysmal atrial flutter/fibrillation Predominantly atrial flutter Rate control: Diltiazem. Rhythm control: N/A. Thromboembolic prophylaxis: Eliquis [id number] SCORE is 3 which correlates to 3% risk of stroke per year (age, atherosclerosis).  Diagnosed in 2020. Status post cardioversion x 1 in May 2024 by ED provider. Zio patch notes atrial fibrillation/flutter burden to be around 7%. No recurrence of A-fib with RVR since last office visit. Continue current medical therapy. " [TY]  1900 D-Dimer, Quant(!): 0.68 VTE unlikely with age adjusted D-dimer [TY]  1900 Troponin I (High Sensitivity): 17 [TY]    Clinical Course User Index [TY] Coral Spikes, DO   Problem List / ED Course: Problem List Items Addressed This Visit       Cardiovascular and Mediastinum   A-fib [facility]) - Primary               Wilkie Aye Mayer Masker, MD 05/09/23 [phone]

## 2023-05-09 NOTE — ED Provider Notes (Signed)
[place] EMERGENCY DEPARTMENT AT [facility] Provider Note   CSN: [id number] Arrival date & time: 05/08/23  1702     History  Chief Complaint  Patient presents with   Atrial Fibrillation    [name] [name] is a 82 y.o. male.  Presents the emergency department chief complaint of atrial fibrillation/fast heart rate.  Patient reports that he is not having lightheadedness dizziness.  States that his heart rate has been elevated today.  Reports history of paroxysmal A-fib.  Has diltiazem prescribed to him, but reports that he does not take it if his blood pressure is in the low 100s as it will bottom out his blood pressure.  He is not taking his diltiazem today.  No fevers no chills no rhinorrhea chest pain.   Atrial Fibrillation       Home Medications Prior to Admission medications   Medication Sig Start Date End Date Taking? Authorizing Provider  acetaminophen (TYLENOL) 500 MG tablet Take 1 tablet (500 mg total) by mouth every 6 (six) hours as needed for moderate pain or mild pain. 04/18/22   Mansouraty, Netty Starring., MD  bisacodyl (DULCOLAX) 10 MG suppository Place 1 suppository (10 mg total) rectally daily as needed for moderate constipation. 02/09/23   Marguerita Merles Latif, DO  carbidopa-levodopa (SINEMET CR) 50-200 MG tablet TAKE 1 TABLET AT BEDTIME 03/12/23   Tat, Rebecca S, DO  carbidopa-levodopa (SINEMET IR) 25-100 MG tablet Take 3 tablets at 7 AM, 2 tablets at 11 AM, 3 tablets at 3 PM, 2 tablets at 7 PM 04/28/23   Tat, Rebecca S, DO  clonazePAM (KLONOPIN) 0.5 MG tablet Take 1 tablet (0.5 mg total) by mouth at bedtime. 02/09/23   Marguerita Merles Latif, DO  cyanocobalamin (VITAMIN B12) 1000 MCG tablet Take 1,000 mcg by mouth daily.    [provider]  diltiazem (CARDIZEM SR) 90 MG 12 hr capsule Take 1 capsule (90 mg total) by mouth 2 (two) times daily. Hold if systolic blood pressure (top number) less than 110 mmHg or pulse less than 55 bpm. 02/09/23 04/06/23  Sheikh, Omair  Latif, DO  ELIQUIS 5 MG TABS tablet TAKE 1 TABLET TWICE A DAY 03/12/23   Tolia, Sunit, DO  Glycerin-Hypromellose-PEG 400 (VISINE DRY EYE OP) Place 1 drop into both eyes 2 (two) times daily as needed (eye irritation).     [provider]  ketoconazole (NIZORAL) 2 % shampoo Apply 1 application  topically 3 (three) times a week. 02/15/19   [provider]  leuprolide, 6 Month, (ELIGARD) 45 MG injection Inject 45 mg into the skin every 6 (six) months.    [provider]  omeprazole (PRILOSEC) 20 MG capsule Take 1 capsule (20 mg total) by mouth daily. 04/20/23   Malachy Mood, MD  ondansetron (ZOFRAN) 4 MG tablet Take 1 tablet (4 mg total) by mouth every 6 (six) hours as needed for nausea. 02/09/23   Marguerita Merles Latif, DO  oxyCODONE (OXY IR/ROXICODONE) 5 MG immediate release tablet Take 1 tablet (5 mg total) by mouth every 6 (six) hours as needed for severe pain. 03/15/23   Malachy Mood, MD  PANCREAZE [id number] units CPEP TAKE 2 CAPSULES WITH BREAKFAST, LUNCH, AND EVENING MEAL (WITH EACH MEAL) AND 1 CAPSULE WITH SNACK 03/12/23   Arnaldo Natal, NP  polyethylene glycol powder (MIRALAX) 17 GM/SCOOP powder Take 17 g by mouth in the morning and at bedtime.    [provider]  rosuvastatin (CRESTOR) 10 MG tablet TAKE 1 TABLET DAILY  06/17/22   Tolia, Sunit, DO  tiZANidine (ZANAFLEX) 2 MG tablet Take 1 tablet (2 mg total) by mouth every 8 (eight) hours as needed for muscle spasms. 02/09/23   Marguerita Merles Latif, DO  triamcinolone cream (KENALOG) 0.1 % Apply 1 application topically 2 (two) times daily. Patient taking differently: Apply 1 application  topically 2 (two) times daily as needed (itching). 01/16/21   Duanne Limerick, MD  zoledronic acid (RECLAST) 5 MG/100ML SOLN injection Inject 5 mg into the vein See admin instructions. Once a year    [provider]      Allergies    Influenza vaccines and Influenza virus vaccine    Review of Systems   Review of  Systems  Physical Exam Updated Vital Signs BP 127/75   Pulse (!) 110   Temp 98 F (36.7 C) (Oral)   Resp 20   Ht 5\' 4"  (1.626 m)   Wt 68.5 kg   SpO2 96%   BMI 25.92 kg/m  Physical Exam Vitals and nursing note reviewed.  HENT:     Head: Normocephalic.     Nose: Nose normal.     Mouth/Throat:     Mouth: Mucous membranes are moist.  Eyes:     Conjunctiva/sclera: Conjunctivae normal.  Cardiovascular:     Rate and Rhythm: Tachycardia present. Rhythm irregular.     Pulses: Normal pulses.  Pulmonary:     Effort: Pulmonary effort is normal.  Abdominal:     General: Abdomen is flat. There is no distension.     Palpations: Abdomen is soft.     Tenderness: There is no abdominal tenderness. There is no guarding or rebound.  Musculoskeletal:        General: Normal range of motion.  Skin:    General: Skin is warm.     Capillary Refill: Capillary refill takes less than 2 seconds.  Neurological:     Mental Status: He is alert and oriented to person, place, and time.  Psychiatric:        Mood and Affect: Mood normal.        Behavior: Behavior normal.     ED Results / Procedures / Treatments   Labs (all labs ordered are listed, but only abnormal results are displayed) Labs Reviewed  BASIC METABOLIC PANEL - Abnormal; Notable for the following components:      Result Value   CO2 21 (*)    Glucose, Bld 100 (*)    Calcium 8.7 (*)    All other components within normal limits  CBC - Abnormal; Notable for the following components:   RBC 4.12 (*)    Hemoglobin 11.7 (*)    HCT 38.7 (*)    All other components within normal limits  D-DIMER, QUANTITATIVE - Abnormal; Notable for the following components:   D-Dimer, Quant 0.68 (*)    All other components within normal limits  TROPONIN I (HIGH SENSITIVITY) - Abnormal; Notable for the following components:   Troponin I (High Sensitivity) 26 (*)    All other components within normal limits  MAGNESIUM  TROPONIN I (HIGH SENSITIVITY)     EKG EKG Interpretation Date/Time:  Saturday May 08 2023 17:23:07 EDT Ventricular Rate:  113 PR Interval:    QRS Duration:  124 QT Interval:  368 QTC Calculation: 504 R Axis:   -37  Text Interpretation: Atrial fibrillation with rapid ventricular response Left axis deviation Right bundle branch block Minimal voltage criteria for LVH, may be normal variant ( R in aVL )  Abnormal ECG When compared with ECG of 05-Feb-2023 07:04, PREVIOUS ECG IS PRESENT Confirmed by Estanislado Pandy [phone]) on 05/08/2023 7:01:22 PM  Radiology DG Chest 2 View  Result Date: 05/08/2023 CLINICAL DATA:  Atrial fib with RVR EXAM: CHEST - 2 VIEW COMPARISON:  02/05/2023 FINDINGS: Heart and mediastinal contours are within normal limits. No focal opacities or effusions. No acute bony abnormality. Multiple compression fractures in the mid and lower thoracic spine, stable. IMPRESSION: No active cardiopulmonary disease. Electronically Signed   By: Charlett Nose M.D.   On: 05/08/2023 19:37    Procedures .Sedation  Date/Time: 05/09/2023 5:34 PM  Performed by: Coral Spikes, DO Authorized by: Coral Spikes, DO   Consent:    Consent obtained:  Verbal and written   Consent given by:  Patient   Risks discussed:  Allergic reaction, prolonged hypoxia resulting in organ damage, dysrhythmia, prolonged sedation necessitating reversal, inadequate sedation and respiratory compromise necessitating ventilatory assistance and intubation   Alternatives discussed:  Analgesia without sedation Universal protocol:    Procedure explained and questions answered to patient or proxy's satisfaction: yes     Relevant documents present and verified: yes     Test results available: yes     Imaging studies available: yes     Immediately prior to procedure, a time out was called: yes   Indications:    Procedure performed:  Cardioversion Pre-sedation assessment:    Time since last food or drink:  6   ASA classification: class 2 - patient  with mild systemic disease     Mouth opening:  2 finger widths   Thyromental distance:  3 finger widths   Mallampati score:  I - soft palate, uvula, fauces, pillars visible   Neck mobility: normal     Pre-sedation assessments completed and reviewed: pre-procedure airway patency not reviewed, pre-procedure cardiovascular function not reviewed, pre-procedure mental status not reviewed, pre-procedure nausea and vomiting status not reviewed and pre-procedure respiratory function not reviewed   Immediate pre-procedure details:    Reviewed: vital signs     Verified: bag valve mask available, emergency equipment available, intubation equipment available, IV patency confirmed, oxygen available and suction available   Procedure details (see MAR for exact dosages):    Preoxygenation:  Nasal cannula   Sedation:  Propofol   Intended level of sedation: deep   Analgesia:  None   Intra-procedure monitoring:  Blood pressure monitoring, continuous capnometry, frequent LOC assessments, cardiac monitor, continuous pulse oximetry and frequent vital sign checks   Intra-procedure events: none     Total Provider sedation time (minutes):  10 Post-procedure details:    Attendance: Constant attendance by certified staff until patient recovered     Recovery: Patient returned to pre-procedure baseline     Post-sedation assessments completed and reviewed: post-procedure airway patency not reviewed, post-procedure cardiovascular function not reviewed, post-procedure mental status not reviewed and post-procedure respiratory function not reviewed     Procedure completion:  Tolerated well, no immediate complications .Cardioversion  Date/Time: 05/09/2023 5:37 PM  Performed by: Coral Spikes, DO Authorized by: Coral Spikes, DO   Consent:    Consent obtained:  Written   Consent given by:  Patient   Alternatives discussed:  No treatment, rate-control medication and alternative treatment Pre-procedure details:     Cardioversion basis:  Elective   Rhythm:  Atrial fibrillation   Electrode placement:  Anterior-posterior Patient sedated: Yes. Refer to sedation procedure documentation for details of sedation.  Attempt one:    Cardioversion mode:  Synchronous   Shock (Joules):  150   Shock outcome:  Conversion to normal sinus rhythm Post-procedure details:    Patient status:  Awake     Medications Ordered in ED Medications  sodium chloride 0.9 % bolus 500 mL (0 mLs Intravenous Stopped 05/08/23 1936)  carbidopa-levodopa (SINEMET IR) 25-100 MG per tablet immediate release 3 tablet (3 tablets Oral Given 05/08/23 2006)  propofol (DIPRIVAN) 10 mg/mL bolus/IV push 20 mg (20 mg Intravenous Given 05/08/23 2325)  diltiazem (CARDIZEM) tablet 60 mg (60 mg Oral Given 05/08/23 2242)    ED Course/ Medical Decision Making/ A&P Clinical Course as of 05/09/23 1739  Sat May 08, 2023  1719 Cards visit in July:  "Paroxysmal atrial flutter/fibrillation Predominantly atrial flutter Rate control: Diltiazem. Rhythm control: N/A. Thromboembolic prophylaxis: Eliquis [id number] SCORE is 3 which correlates to 3% risk of stroke per year (age, atherosclerosis).  Diagnosed in 2020. Status post cardioversion x 1 in May 2024 by ED provider. Zio patch notes atrial fibrillation/flutter burden to be around 7%. No recurrence of A-fib with RVR since last office visit. Continue current medical therapy. " [TY]  1900 D-Dimer, Quant(!): 0.68 VTE unlikely with age adjusted D-dimer [TY]  1900 Troponin I (High Sensitivity): 17 [TY]    Clinical Course User Index [TY] Coral Spikes, DO                                 Medical Decision Making Is a 82 year old male present emergency department for atrial fibrillation.  Heart rate in the 120s to 130s.  EKG appears to be A-fib RVR.  Hemodynamically stable.  No significant underlying causes initial troponin negative.  Subsequent slightly elevated likely secondary to rate.  No  significant metabolic derangements.  Did have a slightly elevated D-dimer, but age-adjusted for PE also patient not having any shortness of breath and no signs of DVT.  Reports compliance with his Eliquis.  PE/DVT unlikely.  Case discussed with cardiology who recommend cardioversion.  Patient sedated with propofol and cardioverted with 150 J.  Care signed out to overnight team; likely discharge if patient remains asymptomatic.  Follow-up with cardiology.  Amount and/or Complexity of Data Reviewed Labs: ordered. Decision-making details documented in ED Course. Radiology: ordered.  Risk Prescription drug management.          Final Clinical Impression(s) / ED Diagnoses Final diagnoses:  Atrial fibrillation, unspecified type [facility])    Rx / DC Orders ED Discharge Orders     None         Coral Spikes, DO 05/09/23 1740

## 2023-05-09 NOTE — Discharge Instructions (Signed)
You were seen today for atrial fibrillation.  You were cardioverted.  Continue your Eliquis and diltiazem.  Follow-up closely with your cardiologist.

## 2023-05-11 DIAGNOSIS — M62551 Muscle wasting and atrophy, not elsewhere classified, right thigh: Secondary | ICD-10-CM | POA: Diagnosis not present

## 2023-05-11 DIAGNOSIS — R2689 Other abnormalities of gait and mobility: Secondary | ICD-10-CM | POA: Diagnosis not present

## 2023-05-11 DIAGNOSIS — Z96641 Presence of right artificial hip joint: Secondary | ICD-10-CM | POA: Diagnosis not present

## 2023-05-11 DIAGNOSIS — S72001S Fracture of unspecified part of neck of right femur, sequela: Secondary | ICD-10-CM | POA: Diagnosis not present

## 2023-05-11 DIAGNOSIS — M25551 Pain in right hip: Secondary | ICD-10-CM | POA: Diagnosis not present

## 2023-05-11 DIAGNOSIS — R2681 Unsteadiness on feet: Secondary | ICD-10-CM | POA: Diagnosis not present

## 2023-05-13 DIAGNOSIS — R2681 Unsteadiness on feet: Secondary | ICD-10-CM | POA: Diagnosis not present

## 2023-05-13 DIAGNOSIS — S72001S Fracture of unspecified part of neck of right femur, sequela: Secondary | ICD-10-CM | POA: Diagnosis not present

## 2023-05-13 DIAGNOSIS — M25551 Pain in right hip: Secondary | ICD-10-CM | POA: Diagnosis not present

## 2023-05-13 DIAGNOSIS — M62551 Muscle wasting and atrophy, not elsewhere classified, right thigh: Secondary | ICD-10-CM | POA: Diagnosis not present

## 2023-05-13 DIAGNOSIS — R2689 Other abnormalities of gait and mobility: Secondary | ICD-10-CM | POA: Diagnosis not present

## 2023-05-13 DIAGNOSIS — Z96641 Presence of right artificial hip joint: Secondary | ICD-10-CM | POA: Diagnosis not present

## 2023-05-14 ENCOUNTER — Other Ambulatory Visit: Payer: Self-pay

## 2023-05-14 DIAGNOSIS — C259 Malignant neoplasm of pancreas, unspecified: Secondary | ICD-10-CM

## 2023-05-16 DIAGNOSIS — H00015 Hordeolum externum left lower eyelid: Secondary | ICD-10-CM | POA: Diagnosis not present

## 2023-05-17 ENCOUNTER — Ambulatory Visit: Payer: Medicare Other | Admitting: Cardiology

## 2023-05-17 ENCOUNTER — Inpatient Hospital Stay: Payer: Medicare Other | Attending: Internal Medicine

## 2023-05-17 ENCOUNTER — Encounter (HOSPITAL_COMMUNITY): Payer: Self-pay

## 2023-05-17 ENCOUNTER — Ambulatory Visit (HOSPITAL_COMMUNITY)
Admission: RE | Admit: 2023-05-17 | Discharge: 2023-05-17 | Disposition: A | Discharge status: Home / Self Care | Payer: Medicare Other | Source: Ambulatory Visit | Attending: Hematology | Admitting: Hematology

## 2023-05-17 DIAGNOSIS — R2681 Unsteadiness on feet: Secondary | ICD-10-CM | POA: Diagnosis not present

## 2023-05-17 DIAGNOSIS — R918 Other nonspecific abnormal finding of lung field: Secondary | ICD-10-CM | POA: Insufficient documentation

## 2023-05-17 DIAGNOSIS — Z905 Acquired absence of kidney: Secondary | ICD-10-CM | POA: Insufficient documentation

## 2023-05-17 DIAGNOSIS — G20A1 Parkinson's disease without dyskinesia, without mention of fluctuations: Secondary | ICD-10-CM | POA: Insufficient documentation

## 2023-05-17 DIAGNOSIS — R2689 Other abnormalities of gait and mobility: Secondary | ICD-10-CM | POA: Diagnosis not present

## 2023-05-17 DIAGNOSIS — Z96641 Presence of right artificial hip joint: Secondary | ICD-10-CM | POA: Diagnosis not present

## 2023-05-17 DIAGNOSIS — M25551 Pain in right hip: Secondary | ICD-10-CM | POA: Diagnosis not present

## 2023-05-17 DIAGNOSIS — M62551 Muscle wasting and atrophy, not elsewhere classified, right thigh: Secondary | ICD-10-CM | POA: Diagnosis not present

## 2023-05-17 DIAGNOSIS — I700 Atherosclerosis of aorta: Secondary | ICD-10-CM | POA: Insufficient documentation

## 2023-05-17 DIAGNOSIS — C250 Malignant neoplasm of head of pancreas: Secondary | ICD-10-CM | POA: Insufficient documentation

## 2023-05-17 DIAGNOSIS — Z79899 Other long term (current) drug therapy: Secondary | ICD-10-CM | POA: Insufficient documentation

## 2023-05-17 DIAGNOSIS — C259 Malignant neoplasm of pancreas, unspecified: Secondary | ICD-10-CM | POA: Insufficient documentation

## 2023-05-17 DIAGNOSIS — C7951 Secondary malignant neoplasm of bone: Secondary | ICD-10-CM | POA: Insufficient documentation

## 2023-05-17 DIAGNOSIS — K8020 Calculus of gallbladder without cholecystitis without obstruction: Secondary | ICD-10-CM | POA: Diagnosis not present

## 2023-05-17 DIAGNOSIS — C61 Malignant neoplasm of prostate: Secondary | ICD-10-CM | POA: Insufficient documentation

## 2023-05-17 DIAGNOSIS — S72001S Fracture of unspecified part of neck of right femur, sequela: Secondary | ICD-10-CM | POA: Diagnosis not present

## 2023-05-17 DIAGNOSIS — M545 Low back pain, unspecified: Secondary | ICD-10-CM | POA: Insufficient documentation

## 2023-05-17 DIAGNOSIS — R5383 Other fatigue: Secondary | ICD-10-CM | POA: Insufficient documentation

## 2023-05-17 DIAGNOSIS — Z887 Allergy status to serum and vaccine status: Secondary | ICD-10-CM | POA: Insufficient documentation

## 2023-05-17 DIAGNOSIS — Z7901 Long term (current) use of anticoagulants: Secondary | ICD-10-CM | POA: Insufficient documentation

## 2023-05-17 DIAGNOSIS — Z9049 Acquired absence of other specified parts of digestive tract: Secondary | ICD-10-CM | POA: Insufficient documentation

## 2023-05-17 LAB — CBC WITH DIFFERENTIAL (CANCER CENTER ONLY)
Abs Immature Granulocytes: 0.01 10*3/uL (ref 0.00–0.07)
Basophils Absolute: 0 10*3/uL (ref 0.0–0.1)
Basophils Relative: 1 %
Eosinophils Absolute: 0.2 10*3/uL (ref 0.0–0.5)
Eosinophils Relative: 4 %
HCT: 41.2 % (ref 39.0–52.0)
Hemoglobin: 13.5 g/dL (ref 13.0–17.0)
Immature Granulocytes: 0 %
Lymphocytes Relative: 23 %
Lymphs Abs: 1.1 10*3/uL (ref 0.7–4.0)
MCH: 29.9 pg (ref 26.0–34.0)
MCHC: 32.8 g/dL (ref 30.0–36.0)
MCV: 91.2 fL (ref 80.0–100.0)
Monocytes Absolute: 0.5 10*3/uL (ref 0.1–1.0)
Monocytes Relative: 11 %
Neutro Abs: 2.9 10*3/uL (ref 1.7–7.7)
Neutrophils Relative %: 61 %
Platelet Count: 213 10*3/uL (ref 150–400)
RBC: 4.52 MIL/uL (ref 4.22–5.81)
RDW: 14 % (ref 11.5–15.5)
WBC Count: 4.8 10*3/uL (ref 4.0–10.5)
nRBC: 0 % (ref 0.0–0.2)

## 2023-05-17 LAB — CMP (CANCER CENTER ONLY)
ALT: <5 U/L (ref 0–44)
AST: 24 U/L (ref 15–41)
Albumin: 4.1 g/dL (ref 3.5–5.0)
Alkaline Phosphatase: 67 U/L (ref 38–126)
Anion gap: 6 (ref 5–15)
BUN: 22 mg/dL (ref 8–23)
CO2: 29 mmol/L (ref 22–32)
Calcium: 9.8 mg/dL (ref 8.9–10.3)
Chloride: 108 mmol/L (ref 98–111)
Creatinine: 1.19 mg/dL (ref 0.61–1.24)
GFR, Estimated: >60 mL/min (ref >60)
Glucose, Bld: 86 mg/dL (ref 70–99)
Potassium: 4.3 mmol/L (ref 3.5–5.1)
Sodium: 143 mmol/L (ref 135–145)
Total Bilirubin: 0.7 mg/dL (ref 0.3–1.2)
Total Protein: 7.1 g/dL (ref 6.5–8.1)

## 2023-05-17 MED ORDER — SODIUM CHLORIDE (PF) 0.9 % IJ SOLN
INTRAMUSCULAR | Status: AC
  Filled 2023-05-17: qty 50

## 2023-05-17 MED ORDER — IOHEXOL 300 MG/ML  SOLN
100.0000 mL | Freq: Once | INTRAMUSCULAR | Status: AC | PRN
  Administered 2023-05-17: 100 mL via INTRAVENOUS

## 2023-05-17 NOTE — Progress Notes (Unsigned)
Assessment/Plan:   1.  Parkinsons Disease  -continue carbidopa/levodopa 25/100, 3 tablets at 7 AM, 2 tablets at 11 AM, 3 tablets at 3 PM, 2 tablets at 7 PM  -Continue carbidopa/levodopa 50/200 CR at bedtime  -Patient has Inbrija and can use that as needed.  He tried it for first AM on and didn't find it helpful but plans to try it when he goes to [place] for tremor.  2.  RBD  -on klonopin from pcp  3.  OSAS  -On BiPAP and followed by [facility] neurology.  4.  Atrial flutter  -On Eliquis  -On diltiazem  5.  Low blood pressure  -Was better today, although he states it has been low at home.  Fairly asymptomatic.  Likely due to combination of diltiazem and Parkinson's disease.  Following with cardiology in [place] now.    6.  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    -Now in palliative care.  7.  Compression fractures.  -Has had several, with kyphoplasty at the L1 level.  Following with pain management.  On oxycodone.  Subjective:   [name] [name] was seen today in follow up for Parkinsons disease.  My previous records were reviewed prior to todays visit as well as outside records available to me.  He is with his "adopted daughter" who supplements hx. unfortunately, the patient did have a fall at the end of May.  He was carrying a tray and he slipped on the wooden floor because he was wearing socks.  He fractured the right femoral neck, which ultimately required surgical intervention.  He saw cardiology for his A-fib/flutter at the end of July.  Medications were not changed.  He did end up in the emergency room at the end of August with rapid ventricular response, but had not taken the diltiazem.  Cardioversion was performed in the emergency room.  He did well and was discharged.  He saw oncology on July 11.  He actually continues to do quite well in regards to his pancreatic cancer.  Current prescribed movement disorder  medications: carbidopa/levodopa 25/100, 3 tablets at 7 AM/2 at 11 AM/3 at 3 PM/2 tablet at 7 PM (increased) carbidopa/levodopa 50/200 CR at bed  Clonazepam 0.5 mg at bedtime  Inbrija (started last visit)     PREVIOUS MEDICATIONS: opicapone (took 2 weeks of samples and d/c)    ALLERGIES:   Allergies  Allergen Reactions   Influenza Vaccines Anaphylaxis    Flu shot: 1974 anaphylaxis. 2nd time swollen arm   Influenza Virus Vaccine Other (See Comments)    CURRENT MEDICATIONS:  Outpatient Encounter Medications as of 05/18/2023  Medication Sig   acetaminophen (TYLENOL) 500 MG tablet Take 1 tablet (500 mg total) by mouth every 6 (six) hours as needed for moderate pain or mild pain.   bisacodyl (DULCOLAX) 10 MG suppository Place 1 suppository (10 mg total) rectally daily as needed for moderate constipation.   carbidopa-levodopa (SINEMET CR) 50-200 MG tablet TAKE 1 TABLET AT BEDTIME   carbidopa-levodopa (SINEMET IR) 25-100 MG tablet Take 3 tablets at 7 AM, 2 tablets at 11 AM, 3 tablets at 3 PM, 2 tablets at 7 PM   clonazePAM (KLONOPIN) 0.5 MG tablet Take 1 tablet (0.5 mg total) by mouth at bedtime.   cyanocobalamin (VITAMIN B12) 1000 MCG tablet Take 1,000 mcg by mouth daily.   diltiazem (CARDIZEM SR) 90 MG 12 hr capsule Take 1 capsule (90 mg total) by mouth 2 (two) times  daily. Hold if systolic blood pressure (top number) less than 110 mmHg or pulse less than 55 bpm.   ELIQUIS 5 MG TABS tablet TAKE 1 TABLET TWICE A DAY   Glycerin-Hypromellose-PEG 400 (VISINE DRY EYE OP) Place 1 drop into both eyes 2 (two) times daily as needed (eye irritation).    ketoconazole (NIZORAL) 2 % shampoo Apply 1 application  topically 3 (three) times a week.   leuprolide, 6 Month, (ELIGARD) 45 MG injection Inject 45 mg into the skin every 6 (six) months.   omeprazole (PRILOSEC) 20 MG capsule Take 1 capsule (20 mg total) by mouth daily.   ondansetron (ZOFRAN) 4 MG tablet Take 1 tablet (4 mg total) by mouth every 6  (six) hours as needed for nausea.   oxyCODONE (OXY IR/ROXICODONE) 5 MG immediate release tablet Take 1 tablet (5 mg total) by mouth every 6 (six) hours as needed for severe pain.   PANCREAZE [id number] units CPEP TAKE 2 CAPSULES WITH BREAKFAST, LUNCH, AND EVENING MEAL (WITH EACH MEAL) AND 1 CAPSULE WITH SNACK   polyethylene glycol powder (MIRALAX) 17 GM/SCOOP powder Take 17 g by mouth in the morning and at bedtime.   rosuvastatin (CRESTOR) 10 MG tablet TAKE 1 TABLET DAILY   tiZANidine (ZANAFLEX) 2 MG tablet Take 1 tablet (2 mg total) by mouth every 8 (eight) hours as needed for muscle spasms.   triamcinolone cream (KENALOG) 0.1 % Apply 1 application topically 2 (two) times daily. (Patient taking differently: Apply 1 application  topically 2 (two) times daily as needed (itching).)   zoledronic acid (RECLAST) 5 MG/100ML SOLN injection Inject 5 mg into the vein See admin instructions. Once a year   No facility-administered encounter medications on file as of 05/18/2023.    Objective:   PHYSICAL EXAMINATION:    VITALS:   There were no vitals filed for this visit.  Wt Readings from Last 3 Encounters:  05/08/23 151 lb 0.2 oz (68.5 kg)  04/22/23 151 lb (68.5 kg)  04/06/23 153 lb 3.2 oz (69.5 kg)    GEN:  The patient appears stated age and is in NAD. HEENT:  Normocephalic, atraumatic.  The mucous membranes are moist. The superficial temporal arteries are without ropiness or tenderness.   Neurological examination:  Orientation: The patient is alert and oriented x3. Cranial nerves: There is good facial symmetry with facial hypomimia. The speech is fluent and clear. Soft palate rises symmetrically and there is no tongue deviation. Hearing is intact to conversational tone. Sensation: Sensation is intact to light touch throughout Motor: Strength is at least antigravity x4.  Movement examination: Tone: There is nl tone in the UE/LE Abnormal movements: there is mild L leg dyskinesia.    Coordination:  There is mild decremation with RAM's, with any form of RAMS, including alternating supination and pronation of the forearm, hand opening and closing, finger taps, heel taps and toe taps, Gait and Station: Pushes off to arise.  Ambulates well even without his walker (he has it, but didn't need it)  I have reviewed and interpreted the following labs independently    Chemistry      Component Value Date/Time   NA 139 05/08/2023 1743   NA 139 12/04/2021 1359   K 3.9 05/08/2023 1743   CL 108 05/08/2023 1743   CO2 21 (L) 05/08/2023 1743   BUN 21 05/08/2023 1743   BUN 20 12/04/2021 1359   CREATININE 0.98 05/08/2023 1743   CREATININE 0.99 03/18/2023 1059      Component Value  Date/Time   CALCIUM 8.7 (L) 05/08/2023 1743   ALKPHOS 78 03/18/2023 1059   AST 25 03/18/2023 1059   ALT <5 03/18/2023 1059   BILITOT 0.8 03/18/2023 1059       Lab Results  Component Value Date   WBC 5.6 05/08/2023   HGB 11.7 (L) 05/08/2023   HCT 38.7 (L) 05/08/2023   MCV 93.9 05/08/2023   PLT 180 05/08/2023    Lab Results  Component Value Date   TSH 1.217 08/16/2019   Total time spent on today's visit was *** minutes, including both face-to-face time and nonface-to-face time.  Time included that spent on review of records (prior notes available to me/labs/imaging if pertinent), discussing treatment and goals, answering patient's questions and coordinating care.   Cc:  Lorenda Ishihara, MD

## 2023-05-18 ENCOUNTER — Ambulatory Visit (INDEPENDENT_AMBULATORY_CARE_PROVIDER_SITE_OTHER): Payer: Medicare Other | Admitting: Neurology

## 2023-05-18 ENCOUNTER — Encounter: Payer: Self-pay | Admitting: Neurology

## 2023-05-18 VITALS — BP 116/76 | HR 108 | Ht 65.0 in | Wt 150.6 lb

## 2023-05-18 DIAGNOSIS — Z905 Acquired absence of kidney: Secondary | ICD-10-CM | POA: Diagnosis not present

## 2023-05-18 DIAGNOSIS — R351 Nocturia: Secondary | ICD-10-CM | POA: Diagnosis not present

## 2023-05-18 DIAGNOSIS — C7951 Secondary malignant neoplasm of bone: Secondary | ICD-10-CM | POA: Diagnosis not present

## 2023-05-18 DIAGNOSIS — C61 Malignant neoplasm of prostate: Secondary | ICD-10-CM | POA: Diagnosis not present

## 2023-05-18 DIAGNOSIS — G20B2 Parkinson's disease with dyskinesia, with fluctuations: Secondary | ICD-10-CM

## 2023-05-18 NOTE — Patient Instructions (Signed)
SAVE THE DATE!  We are planning a Parkinsons Disease educational symposium at [facility] in [place] on October 11.  More details to come!  If you would like to be added to our email list to get further information, email [email or web address].  To sign up, you can email [email or web address].  We hope to see you there!

## 2023-05-19 DIAGNOSIS — R2689 Other abnormalities of gait and mobility: Secondary | ICD-10-CM | POA: Diagnosis not present

## 2023-05-19 DIAGNOSIS — R2681 Unsteadiness on feet: Secondary | ICD-10-CM | POA: Diagnosis not present

## 2023-05-19 DIAGNOSIS — S72001S Fracture of unspecified part of neck of right femur, sequela: Secondary | ICD-10-CM | POA: Diagnosis not present

## 2023-05-19 DIAGNOSIS — M62551 Muscle wasting and atrophy, not elsewhere classified, right thigh: Secondary | ICD-10-CM | POA: Diagnosis not present

## 2023-05-19 DIAGNOSIS — Z96641 Presence of right artificial hip joint: Secondary | ICD-10-CM | POA: Diagnosis not present

## 2023-05-19 DIAGNOSIS — M25551 Pain in right hip: Secondary | ICD-10-CM | POA: Diagnosis not present

## 2023-05-20 ENCOUNTER — Inpatient Hospital Stay: Payer: Medicare Other

## 2023-05-20 ENCOUNTER — Inpatient Hospital Stay (HOSPITAL_BASED_OUTPATIENT_CLINIC_OR_DEPARTMENT_OTHER): Payer: Medicare Other | Admitting: Hematology

## 2023-05-20 VITALS — BP 129/84 | HR 64 | Temp 97.7°F | Resp 18 | Wt 151.8 lb

## 2023-05-20 DIAGNOSIS — M545 Low back pain, unspecified: Secondary | ICD-10-CM | POA: Diagnosis not present

## 2023-05-20 DIAGNOSIS — C259 Malignant neoplasm of pancreas, unspecified: Secondary | ICD-10-CM | POA: Diagnosis not present

## 2023-05-20 DIAGNOSIS — Z905 Acquired absence of kidney: Secondary | ICD-10-CM | POA: Diagnosis not present

## 2023-05-20 DIAGNOSIS — C61 Malignant neoplasm of prostate: Secondary | ICD-10-CM | POA: Diagnosis not present

## 2023-05-20 DIAGNOSIS — C7951 Secondary malignant neoplasm of bone: Secondary | ICD-10-CM | POA: Diagnosis not present

## 2023-05-20 DIAGNOSIS — Z7901 Long term (current) use of anticoagulants: Secondary | ICD-10-CM | POA: Diagnosis not present

## 2023-05-20 DIAGNOSIS — Z96641 Presence of right artificial hip joint: Secondary | ICD-10-CM | POA: Diagnosis not present

## 2023-05-20 DIAGNOSIS — Z09 Encounter for follow-up examination after completed treatment for conditions other than malignant neoplasm: Secondary | ICD-10-CM | POA: Diagnosis not present

## 2023-05-20 DIAGNOSIS — R5383 Other fatigue: Secondary | ICD-10-CM | POA: Diagnosis not present

## 2023-05-20 DIAGNOSIS — Z79899 Other long term (current) drug therapy: Secondary | ICD-10-CM | POA: Diagnosis not present

## 2023-05-20 DIAGNOSIS — Z9049 Acquired absence of other specified parts of digestive tract: Secondary | ICD-10-CM | POA: Diagnosis not present

## 2023-05-20 DIAGNOSIS — I700 Atherosclerosis of aorta: Secondary | ICD-10-CM | POA: Diagnosis not present

## 2023-05-20 DIAGNOSIS — M7061 Trochanteric bursitis, right hip: Secondary | ICD-10-CM | POA: Diagnosis not present

## 2023-05-20 DIAGNOSIS — R918 Other nonspecific abnormal finding of lung field: Secondary | ICD-10-CM | POA: Diagnosis not present

## 2023-05-20 DIAGNOSIS — C250 Malignant neoplasm of head of pancreas: Secondary | ICD-10-CM | POA: Diagnosis not present

## 2023-05-20 DIAGNOSIS — G20A1 Parkinson's disease without dyskinesia, without mention of fluctuations: Secondary | ICD-10-CM | POA: Diagnosis not present

## 2023-05-20 DIAGNOSIS — Z887 Allergy status to serum and vaccine status: Secondary | ICD-10-CM | POA: Diagnosis not present

## 2023-05-20 LAB — CBC WITH DIFFERENTIAL (CANCER CENTER ONLY)
Abs Immature Granulocytes: 0.02 10*3/uL (ref 0.00–0.07)
Basophils Absolute: 0 10*3/uL (ref 0.0–0.1)
Basophils Relative: 0 %
Eosinophils Absolute: 0 10*3/uL (ref 0.0–0.5)
Eosinophils Relative: 1 %
HCT: 39.1 % (ref 39.0–52.0)
Hemoglobin: 12.3 g/dL — ABNORMAL LOW (ref 13.0–17.0)
Immature Granulocytes: 0 %
Lymphocytes Relative: 11 %
Lymphs Abs: 0.7 10*3/uL (ref 0.7–4.0)
MCH: 29.1 pg (ref 26.0–34.0)
MCHC: 31.5 g/dL (ref 30.0–36.0)
MCV: 92.7 fL (ref 80.0–100.0)
Monocytes Absolute: 0.1 10*3/uL (ref 0.1–1.0)
Monocytes Relative: 1 %
Neutro Abs: 5.3 10*3/uL (ref 1.7–7.7)
Neutrophils Relative %: 87 %
Platelet Count: 199 10*3/uL (ref 150–400)
RBC: 4.22 MIL/uL (ref 4.22–5.81)
RDW: 14.1 % (ref 11.5–15.5)
WBC Count: 6.1 10*3/uL (ref 4.0–10.5)
nRBC: 0 % (ref 0.0–0.2)

## 2023-05-20 LAB — CMP (CANCER CENTER ONLY)
ALT: 7 U/L (ref 0–44)
AST: 25 U/L (ref 15–41)
Albumin: 4.3 g/dL (ref 3.5–5.0)
Alkaline Phosphatase: 69 U/L (ref 38–126)
Anion gap: 4 — ABNORMAL LOW (ref 5–15)
BUN: 25 mg/dL — ABNORMAL HIGH (ref 8–23)
CO2: 26 mmol/L (ref 22–32)
Calcium: 9.9 mg/dL (ref 8.9–10.3)
Chloride: 108 mmol/L (ref 98–111)
Creatinine: 1.18 mg/dL (ref 0.61–1.24)
GFR, Estimated: >60 mL/min (ref >60)
Glucose, Bld: 246 mg/dL — ABNORMAL HIGH (ref 70–99)
Potassium: 4.9 mmol/L (ref 3.5–5.1)
Sodium: 138 mmol/L (ref 135–145)
Total Bilirubin: 0.8 mg/dL (ref 0.3–1.2)
Total Protein: 7.5 g/dL (ref 6.5–8.1)

## 2023-05-20 MED ORDER — OXYCODONE HCL 5 MG PO TABS: 5.0000 mg | ORAL_TABLET | Freq: Four times a day (QID) | ORAL | 0 refills | Status: DC | PRN

## 2023-05-20 NOTE — Assessment & Plan Note (Addendum)
diagnosed in 09/2021, deemed poor surgical candidate due to his age and medical co-morbidities -S/p single agent gemcitabine 01/13/22 - 04/01/22 and 5 fx SBRT [name]) 05/04/22 - 05/14/22. CA 19-9 decreased after treatment -Surveillance CT 11/09/2022 showed slightly decreased size of pancreatic head mass, no evidence of metastatic disease.  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  He is taking ibuprofen as needed.  No other clinical concern for cancer progression. -Lab reviewed, overall stable, tumor marker still pending. -He was admitted for right femoral neck fracture  on 02/03/23 after a fall and had surgery  -CT scan from May 17, 2023 showed multiple lung nodules, with dominant 1.5 cm nodule in the right middle lobe, highly concerning for metastasis.  I personally reviewed the scan images with patient and his daughter. -I discussed option of bronchoscopy and biopsy, versus PET scan.  Patient agrees with PET scan.  I will also obtain tumor markers today. -Patient is interested in chemotherapy if he has disease progression.

## 2023-05-20 NOTE — Progress Notes (Signed)
[facility]   [phone] [phone]   Clinic Follow up Note   Patient Care Team: Lorenda Ishihara, MD as PCP - General (Internal Medicine) Tat, Octaviano Batty, DO as Consulting Physician (Neurology) Earna Coder, MD as Consulting Physician (Hematology and Oncology) Fritzi Mandes, MD as Consulting Physician (General Surgery) Malachy Mood, MD as Consulting Physician (Hematology and Oncology)  Date of Service:  05/20/2023  CHIEF COMPLAINT: f/u of pancreatic cancer   CURRENT THERAPY:  Supportive care   ASSESSMENT:  ODAY [name] is a 82 y.o. male with   Pancreatic cancer (HCC) diagnosed in 09/2021, deemed poor surgical candidate due to his age and medical co-morbidities -S/p single agent gemcitabine 01/13/22 - 04/01/22 and 5 fx SBRT [name]) 05/04/22 - 05/14/22. CA 19-9 decreased after treatment -Surveillance CT 11/09/2022 showed slightly decreased size of pancreatic head mass, no evidence of metastatic disease.  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  He is taking ibuprofen as needed.  No other clinical concern for cancer progression. -Lab reviewed, overall stable, tumor marker still pending. -He was admitted for right femoral neck fracture  on 02/03/23 after a fall and had surgery  -CT scan from May 17, 2023 showed multiple lung nodules, with dominant 1.5 cm nodule in the right middle lobe, highly concerning for metastasis.  I personally reviewed the scan images with patient and his daughter. -I discussed option of bronchoscopy and biopsy, versus PET scan.  Patient agrees with PET scan.  I will also obtain tumor markers today. -Patient is interested in chemotherapy if he has disease progression.    PLAN: -reviewed CT scan w/ pt -recommend biopsy or PET scan to rule metastatic  -pt agree to treatment if he has disease progression. -I order PET scan -lab today for tumor  marker -f/u w/ phone visit.  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 Sclerotic 1.5 cm left ischial lesion/ Sclerotic medial left iliac bone 1.5 cm lesion (series 2/image 47), increased from 1.0 cm. Gleason score of 4+5= 9; with almost all cores involved greater than 80%.  9/16 Lupron 27-month depot on 9/16. [Urology; Dr.Siniski]  # MID OCT 2020- Zytiga 1000 mg+ prednisone; stopped December 2021 [poor tolerance if with RVR]; DISCONTINUED.   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       Prostate cancer metastatic to bone (HCC)  05/24/2019 Initial Diagnosis   Prostate cancer metastatic to bone [facility])   Pancreatic cancer (HCC)  10/02/2021 Imaging   CT ABDOMEN PELVIS W CONTRAST   IMPRESSION: 1. There is new pancreatic ductal dilation with an indeterminate 19 mm hypodense low-density mass area in the head/uncinate process of the pancreas. Recommend further evaluation with dedicated MRI/MRCP with contrast. 2. No evidence of bowel obstruction.  Moderate rectal stool ball. 3. Scattered peripherally located clustered pulmonary nodules in the RIGHT middle lobe. Findings are likely infectious or inflammatory in etiology. Given history of malignancy, recommend follow-up as per clinical protocol.   10/28/2021 Imaging   MR ABDOMEN MRCP W WO CONTAST   IMPRESSION: 1. Examination is significantly limited by breath motion artifact throughout. 2. The main pancreatic duct is diffusely dilated from the level of the  superior pancreatic head, measuring up to 0.7 cm. 3. In the inferior pancreatic head and uncinate, there is a multilobulated, fluid signal cystic lesion measuring 1.8 x 1.0 cm. Due to breath motion artifact  it is difficult to determine whether this communicates to the adjacent duct. There are multiple additional subcentimeter cystic lesions scattered throughout the pancreas, several of which clearly communicate to the main pancreatic duct. 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 Recommend EUS/FNA for further diagnosis given the presence of pancreatic ductal dilatation. 4. Status post left nephrectomy. 5. No evidence of recurrent or metastatic disease in the abdomen.   12/25/2021 Procedure   UPPER ENDOSCOPIC ULTRASOUND-By Dr. Meridee Score  - A mass-like region was identified in the pancreatic head where the pancreatic duct dilates with multiple cystic regions noted throughout the pancreas as well. 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 However, the endosonographic appearance is suspicious for potential adenocarcinoma. This was staged T2 N0 Mx by endosonographic criteria. T - No malignant-appearing lymph nodes were visualized in the celiac region (level 20), peripancreatic region and porta hepatis region.   12/25/2021 Pathology Results   CYTOLOGY - NON PAP  CASE: [id number]   FINAL MICROSCOPIC DIAGNOSIS:  A. PANCREAS, HEAD, FINE NEEDLE ASPIRATION:  - Malignant cells consistent with adenocarcinoma     01/01/2022 Initial Diagnosis   Pancreatic cancer (HCC)   01/13/2022 - 04/01/2022 Chemotherapy   Patient is on Treatment Plan : PANCREAS Gemcitabine D1,15 q28d x 4 Cycles     01/26/2022 Cancer Staging   Staging form: Exocrine Pancreas, AJCC 8th Edition - Clinical: Stage IB (cT2, cN0, cM0) - Signed by Malachy Mood, MD on 01/26/2022 Total positive nodes: 0    Genetic Testing   [facility] CancerNext-Expanded Panel was Negative. Of note, a variant of uncertain significance was identified in the BLM gene (p.[id number]) and GALNT12 gene ([id number]). Report date is 01/26/2022.  The CancerNext-Expanded gene panel offered by [facility] and includes sequencing, rearrangement, and RNA analysis for the following 77 genes: AIP, ALK, APC, ATM, AXIN2, BAP1, BARD1, BLM, BMPR1A, BRCA1, BRCA2, BRIP1, CDC73, CDH1, CDK4, CDKN1B, CDKN2A, CHEK2, CTNNA1, DICER1, FANCC, FH, FLCN, GALNT12, KIF1B, LZTR1, MAX, MEN1, MET, MLH1, MSH2, MSH3, MSH6, MUTYH, NBN, NF1, NF2, NTHL1, PALB2, PHOX2B, PMS2, POT1, PRKAR1A, PTCH1, PTEN, RAD51C, RAD51D, RB1, RECQL, RET, SDHA, SDHAF2, SDHB, SDHC, SDHD, SMAD4, SMARCA4, SMARCB1, SMARCE1, STK11, SUFU, TMEM127, TP53, TSC1, TSC2, VHL and XRCC2 (sequencing and deletion/duplication); EGFR, EGLN1, HOXB13, KIT, MITF, PDGFRA, POLD1, and POLE (sequencing only); EPCAM and GREM1 (deletion/duplication only).    08/12/2022 Imaging    IMPRESSION: 1. Slight interval increase in size of the pancreatic head-uncinate process mass with increasing direct contact of the SMA and portal vein as discussed. Also with a replaced RIGHT hepatic artery which passes through the tumor. 2. No signs of acute inflammation currently about the pancreas. With similar appearance of ductal obstruction and peripheral atrophy of pancreas due to the tumor. 3. Post LEFT nephrectomy. 4. Sclerotic bony lesions of the LEFT ischium and LEFT iliac bone similar to prior imaging. 5. Mild hyperenhancement of LEFT prostate 14 mm with asymmetry, nonspecific. This is unchanged in this patient with history of prostate neoplasm. 6. Aortic atherosclerosis.      INTERVAL HISTORY:  [name] [name] is here for a follow up of pancreatic cancer. He was last seen by me on 03/18/2023. He presents to the clinic accompanied by daughter. Pt state that he has some discomfort in his abdomen. He rates  the pain at a 4. He denies having constipation. He has some fatigue. He is able to care for himself.      All other systems were reviewed with the patient and are negative.  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)    Prostate cancer metastatic to bone (HCC)    REM sleep behavior disorder    Sleep apnea    BiPap    SURGICAL HISTORY: Past Surgical History:  Procedure Laterality Date   ANTERIOR APPROACH HEMI HIP ARTHROPLASTY Right 02/05/2023   Procedure: ANTERIOR APPROACH HEMI HIP ARTHROPLASTY;  Surgeon: Jodi Geralds, MD;  Location: [facility] OR;  Service: Orthopedics;  Laterality: Right;   APPENDECTOMY  1963   BIOPSY  12/25/2021   Procedure: BIOPSY;  Surgeon: Lemar Lofty., MD;  Location: [facility] ENDOSCOPY;  Service: Gastroenterology;;   COLONOSCOPY  2010, 2015   ESOPHAGOGASTRODUODENOSCOPY N/A 04/16/2022   Procedure: ESOPHAGOGASTRODUODENOSCOPY (EGD);  Surgeon: Lemar Lofty., MD;  Location[facility] ENDOSCOPY;  Service: Gastroenterology;  Laterality: N/A;   ESOPHAGOGASTRODUODENOSCOPY (EGD) WITH PROPOFOL N/A 12/25/2021   Procedure: ESOPHAGOGASTRODUODENOSCOPY (EGD) WITH PROPOFOL;  Surgeon: Meridee Score Netty Starring., MD;  Location: [facility] ENDOSCOPY;  Service: Gastroenterology;  Laterality: N/A;   EUS N/A 12/25/2021   Procedure: UPPER ENDOSCOPIC ULTRASOUND (EUS) RADIAL;  Surgeon: Lemar Lofty., MD;  Location: [facility] ENDOSCOPY;  Service: Gastroenterology;  Laterality: N/A;   EUS N/A 04/16/2022   Procedure: UPPER ENDOSCOPIC ULTRASOUND (EUS) RADIAL;  Surgeon: Lemar Lofty., MD;  Location: [facility] ENDOSCOPY;  Service: Gastroenterology;  Laterality: N/A;   FIDUCIAL MARKER PLACEMENT N/A 04/16/2022   Procedure: FIDUCIAL MARKER PLACEMENT;  Surgeon: Lemar Lofty., MD;  Location: [facility] ENDOSCOPY;  Service: Gastroenterology;  Laterality: N/A;   FINE NEEDLE ASPIRATION  12/25/2021   Procedure: FINE NEEDLE ASPIRATION (FNA) LINEAR;  Surgeon: Lemar Lofty., MD;  Location: [facility] ENDOSCOPY;  Service:  Gastroenterology;;   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   KYPHOPLASTY N/A 08/15/2020   Procedure: L2 compression fracture;  Surgeon: Kennedy Bucker, MD;  Location: [facility] ORS;  Service: Orthopedics;  Laterality: N/A;   KYPHOPLASTY N/A 08/29/2020   Procedure: T8 KYPHOPLASTY;  Surgeon: Kennedy Bucker, MD;  Location: [facility] ORS;  Service: Orthopedics;  Laterality: N/A;   KYPHOPLASTY N/A 11/12/2020   Procedure: L1 KYPHOPLASTY;  Surgeon: Kennedy Bucker, MD;  Location: [facility] ORS;  Service: Orthopedics;  Laterality: N/A;   PORTACATH PLACEMENT N/A 02/05/2022   Procedure: INSERTION PORT-A-CATH;  Surgeon: Fritzi Mandes, MD;  Location: [facility] ORS;  Service: General;  Laterality: N/A;    I have reviewed the social history and family history with the patient and they are unchanged from previous note.  ALLERGIES:  is allergic to influenza vaccines and influenza virus vaccine.  MEDICATIONS:  Current Outpatient Medications  Medication Sig Dispense Refill   acetaminophen (TYLENOL) 500 MG tablet Take 1 tablet (500 mg total) by mouth every 6 (six) hours as needed for moderate pain or mild pain. 30 tablet 0   bisacodyl (DULCOLAX) 10 MG suppository Place 1 suppository (10 mg total) rectally daily as needed for moderate constipation. 12 suppository 0   carbidopa-levodopa (SINEMET CR) 50-200 MG tablet TAKE 1 TABLET AT BEDTIME 90 tablet 0   carbidopa-levodopa (SINEMET IR) 25-100 MG tablet Take  3 tablets at 7 AM, 2 tablets at 11 AM, 3 tablets at 3 PM, 2 tablets at 7 PM 900 tablet 0   clonazePAM (KLONOPIN) 0.5 MG tablet Take 1 tablet (0.5 mg total) by mouth at bedtime. 5 tablet 0   cyanocobalamin (VITAMIN B12) 1000 MCG tablet Take 1,000 mcg by mouth daily.     diltiazem (CARDIZEM SR) 90 MG 12 hr capsule Take 1 capsule (90 mg total) by mouth 2 (two) times daily. Hold if systolic blood pressure (top number) less than 110 mmHg or pulse less than 55 bpm. 60 capsule 0   ELIQUIS 5 MG TABS tablet TAKE 1 TABLET TWICE A DAY 120 tablet 5    Glycerin-Hypromellose-PEG 400 (VISINE DRY EYE OP) Place 1 drop into both eyes 2 (two) times daily as needed (eye irritation).      ketoconazole (NIZORAL) 2 % shampoo Apply 1 application  topically 3 (three) times a week.     leuprolide, 6 Month, (ELIGARD) 45 MG injection Inject 45 mg into the skin every 6 (six) months.     omeprazole (PRILOSEC) 20 MG capsule Take 1 capsule (20 mg total) by mouth daily. 90 capsule 3   ondansetron (ZOFRAN) 4 MG tablet Take 1 tablet (4 mg total) by mouth every 6 (six) hours as needed for nausea. 20 tablet 0   oxyCODONE (OXY IR/ROXICODONE) 5 MG immediate release tablet Take 1 tablet (5 mg total) by mouth every 6 (six) hours as needed for severe pain. 30 tablet 0   PANCREAZE 37000-97300 units CPEP TAKE 2 CAPSULES WITH BREAKFAST, LUNCH, AND EVENING MEAL (WITH EACH MEAL) AND 1 CAPSULE WITH SNACK 600 capsule 5   polyethylene glycol powder (MIRALAX) 17 GM/SCOOP powder Take 17 g by mouth in the morning and at bedtime.     rosuvastatin (CRESTOR) 10 MG tablet TAKE 1 TABLET DAILY 90 tablet 3   tiZANidine (ZANAFLEX) 2 MG tablet Take 1 tablet (2 mg total) by mouth every 8 (eight) hours as needed for muscle spasms. 10 tablet 0   triamcinolone cream (KENALOG) 0.1 % Apply 1 application topically 2 (two) times daily. (Patient taking differently: Apply 1 application  topically 2 (two) times daily as needed (itching).) 453.6 g 1   zoledronic acid (RECLAST) 5 MG/100ML SOLN injection Inject 5 mg into the vein See admin instructions. Once a year     No current facility-administered medications for this visit.    PHYSICAL EXAMINATION: ECOG PERFORMANCE STATUS: 2 - Symptomatic, <50% confined to bed  Vitals:   05/20/23 1429  BP: 129/84  Pulse: 64  Resp: 18  Temp: 97.7 F (36.5 C)  SpO2: 98%   Wt Readings from Last 3 Encounters:  05/20/23 151 lb 12.8 oz (68.9 kg)  05/18/23 150 lb 9.6 oz (68.3 kg)  05/08/23 151 lb 0.2 oz (68.5 kg)     GENERAL:alert, no distress and  comfortable SKIN: skin color normal, no rashes or significant lesions EYES: normal, Conjunctiva are pink and non-injected, sclera clear  NEURO: alert & oriented x 3 with fluent speech  LABORATORY DATA:  I have reviewed the data as listed    Latest Ref Rng & Units 05/17/2023   11:06 AM 05/08/2023    5:43 PM 03/18/2023   10:59 AM  CBC  WBC 4.0 - 10.5 K/uL 4.8  5.6  6.4   Hemoglobin 13.0 - 17.0 g/dL 54.0  98.1  19.1   Hematocrit 39.0 - 52.0 % 41.2  38.7  37.4   Platelets 150 - 400 K/uL  213  180  251         Latest Ref Rng & Units 05/17/2023   11:06 AM 05/08/2023    5:43 PM 03/18/2023   10:59 AM  CMP  Glucose 70 - 99 mg/dL 86  409  811   BUN 8 - 23 mg/dL 22  21  26    Creatinine 0.61 - 1.24 mg/dL 9.14  7.82  9.56   Sodium 135 - 145 mmol/L 143  139  139   Potassium 3.5 - 5.1 mmol/L 4.3  3.9  4.4   Chloride 98 - 111 mmol/L 108  108  106   CO2 22 - 32 mmol/L 29  21  26    Calcium 8.9 - 10.3 mg/dL 9.8  8.7  21.3   Total Protein 6.5 - 8.1 g/dL 7.1   7.5   Total Bilirubin 0.3 - 1.2 mg/dL 0.7   0.8   Alkaline Phos 38 - 126 U/L 67   78   AST 15 - 41 U/L 24   25   ALT 0 - 44 U/L <5   <5       RADIOGRAPHIC STUDIES: I have personally reviewed the radiological images as listed and agreed with the findings in the report. No results found.    No orders of the defined types were placed in this encounter.  All questions were answered. The patient knows to call the clinic with any problems, questions or concerns. No barriers to learning was detected. The total time spent in the appointment was 30 minutes.     Malachy Mood, MD 05/20/2023   Carolin Coy, CMA, am acting as scribe for Malachy Mood, MD.   I have reviewed the above documentation for accuracy and completeness, and I agree with the above.

## 2023-05-21 ENCOUNTER — Encounter: Payer: Self-pay | Admitting: Internal Medicine

## 2023-05-21 ENCOUNTER — Telehealth: Payer: Self-pay | Admitting: Hematology

## 2023-05-21 ENCOUNTER — Encounter: Payer: Self-pay | Admitting: Hematology

## 2023-05-21 LAB — CANCER ANTIGEN 19-9: CA 19-9: 330 U/mL — ABNORMAL HIGH (ref 0–35)

## 2023-05-24 DIAGNOSIS — I482 Chronic atrial fibrillation, unspecified: Secondary | ICD-10-CM | POA: Diagnosis not present

## 2023-05-24 DIAGNOSIS — Z9989 Dependence on other enabling machines and devices: Secondary | ICD-10-CM | POA: Diagnosis not present

## 2023-05-24 DIAGNOSIS — G20B1 Parkinson's disease with dyskinesia, without mention of fluctuations: Secondary | ICD-10-CM | POA: Diagnosis not present

## 2023-05-24 DIAGNOSIS — G4752 REM sleep behavior disorder: Secondary | ICD-10-CM | POA: Diagnosis not present

## 2023-05-24 DIAGNOSIS — G4733 Obstructive sleep apnea (adult) (pediatric): Secondary | ICD-10-CM | POA: Diagnosis not present

## 2023-05-26 DIAGNOSIS — M25551 Pain in right hip: Secondary | ICD-10-CM | POA: Diagnosis not present

## 2023-05-26 DIAGNOSIS — R2689 Other abnormalities of gait and mobility: Secondary | ICD-10-CM | POA: Diagnosis not present

## 2023-05-26 DIAGNOSIS — Z96641 Presence of right artificial hip joint: Secondary | ICD-10-CM | POA: Diagnosis not present

## 2023-05-26 DIAGNOSIS — S72001S Fracture of unspecified part of neck of right femur, sequela: Secondary | ICD-10-CM | POA: Diagnosis not present

## 2023-05-26 DIAGNOSIS — R2681 Unsteadiness on feet: Secondary | ICD-10-CM | POA: Diagnosis not present

## 2023-05-26 DIAGNOSIS — M62551 Muscle wasting and atrophy, not elsewhere classified, right thigh: Secondary | ICD-10-CM | POA: Diagnosis not present

## 2023-05-27 DIAGNOSIS — G20B1 Parkinson's disease with dyskinesia, without mention of fluctuations: Secondary | ICD-10-CM | POA: Diagnosis not present

## 2023-05-27 DIAGNOSIS — B029 Zoster without complications: Secondary | ICD-10-CM | POA: Diagnosis not present

## 2023-05-27 DIAGNOSIS — M792 Neuralgia and neuritis, unspecified: Secondary | ICD-10-CM | POA: Diagnosis not present

## 2023-05-27 DIAGNOSIS — D8481 Immunodeficiency due to conditions classified elsewhere: Secondary | ICD-10-CM | POA: Diagnosis not present

## 2023-05-28 DIAGNOSIS — M62551 Muscle wasting and atrophy, not elsewhere classified, right thigh: Secondary | ICD-10-CM | POA: Diagnosis not present

## 2023-05-28 DIAGNOSIS — S72001S Fracture of unspecified part of neck of right femur, sequela: Secondary | ICD-10-CM | POA: Diagnosis not present

## 2023-05-28 DIAGNOSIS — Z96641 Presence of right artificial hip joint: Secondary | ICD-10-CM | POA: Diagnosis not present

## 2023-05-28 DIAGNOSIS — R2689 Other abnormalities of gait and mobility: Secondary | ICD-10-CM | POA: Diagnosis not present

## 2023-05-28 DIAGNOSIS — M25551 Pain in right hip: Secondary | ICD-10-CM | POA: Diagnosis not present

## 2023-05-28 DIAGNOSIS — R2681 Unsteadiness on feet: Secondary | ICD-10-CM | POA: Diagnosis not present

## 2023-06-01 DIAGNOSIS — R2681 Unsteadiness on feet: Secondary | ICD-10-CM | POA: Diagnosis not present

## 2023-06-01 DIAGNOSIS — S72001S Fracture of unspecified part of neck of right femur, sequela: Secondary | ICD-10-CM | POA: Diagnosis not present

## 2023-06-01 DIAGNOSIS — Z96641 Presence of right artificial hip joint: Secondary | ICD-10-CM | POA: Diagnosis not present

## 2023-06-01 DIAGNOSIS — M62551 Muscle wasting and atrophy, not elsewhere classified, right thigh: Secondary | ICD-10-CM | POA: Diagnosis not present

## 2023-06-01 DIAGNOSIS — M25551 Pain in right hip: Secondary | ICD-10-CM | POA: Diagnosis not present

## 2023-06-01 DIAGNOSIS — R2689 Other abnormalities of gait and mobility: Secondary | ICD-10-CM | POA: Diagnosis not present

## 2023-06-03 ENCOUNTER — Ambulatory Visit (HOSPITAL_COMMUNITY)
Admission: RE | Admit: 2023-06-03 | Discharge: 2023-06-03 | Disposition: A | Discharge status: Home / Self Care | Payer: Medicare Other | Source: Ambulatory Visit | Attending: Hematology | Admitting: Hematology

## 2023-06-03 DIAGNOSIS — K5730 Diverticulosis of large intestine without perforation or abscess without bleeding: Secondary | ICD-10-CM | POA: Insufficient documentation

## 2023-06-03 DIAGNOSIS — R918 Other nonspecific abnormal finding of lung field: Secondary | ICD-10-CM | POA: Insufficient documentation

## 2023-06-03 DIAGNOSIS — R2681 Unsteadiness on feet: Secondary | ICD-10-CM | POA: Diagnosis not present

## 2023-06-03 DIAGNOSIS — Z96641 Presence of right artificial hip joint: Secondary | ICD-10-CM | POA: Diagnosis not present

## 2023-06-03 DIAGNOSIS — S72001S Fracture of unspecified part of neck of right femur, sequela: Secondary | ICD-10-CM | POA: Diagnosis not present

## 2023-06-03 DIAGNOSIS — M62551 Muscle wasting and atrophy, not elsewhere classified, right thigh: Secondary | ICD-10-CM | POA: Diagnosis not present

## 2023-06-03 DIAGNOSIS — R2689 Other abnormalities of gait and mobility: Secondary | ICD-10-CM | POA: Diagnosis not present

## 2023-06-03 DIAGNOSIS — M25551 Pain in right hip: Secondary | ICD-10-CM | POA: Diagnosis not present

## 2023-06-03 DIAGNOSIS — I2510 Atherosclerotic heart disease of native coronary artery without angina pectoris: Secondary | ICD-10-CM | POA: Insufficient documentation

## 2023-06-03 DIAGNOSIS — I700 Atherosclerosis of aorta: Secondary | ICD-10-CM | POA: Insufficient documentation

## 2023-06-03 DIAGNOSIS — C259 Malignant neoplasm of pancreas, unspecified: Secondary | ICD-10-CM | POA: Insufficient documentation

## 2023-06-03 LAB — GLUCOSE, CAPILLARY: Glucose-Capillary: 101 mg/dL — ABNORMAL HIGH (ref 70–99)

## 2023-06-03 MED ORDER — FLUDEOXYGLUCOSE F - 18 (FDG) INJECTION
7.0000 | Freq: Once | INTRAVENOUS | Status: AC | PRN
  Administered 2023-06-03: 7 via INTRAVENOUS

## 2023-06-08 DIAGNOSIS — S72001S Fracture of unspecified part of neck of right femur, sequela: Secondary | ICD-10-CM | POA: Diagnosis not present

## 2023-06-08 DIAGNOSIS — M62551 Muscle wasting and atrophy, not elsewhere classified, right thigh: Secondary | ICD-10-CM | POA: Diagnosis not present

## 2023-06-08 DIAGNOSIS — R2689 Other abnormalities of gait and mobility: Secondary | ICD-10-CM | POA: Diagnosis not present

## 2023-06-08 DIAGNOSIS — Z96641 Presence of right artificial hip joint: Secondary | ICD-10-CM | POA: Diagnosis not present

## 2023-06-08 DIAGNOSIS — M25551 Pain in right hip: Secondary | ICD-10-CM | POA: Diagnosis not present

## 2023-06-08 DIAGNOSIS — R2681 Unsteadiness on feet: Secondary | ICD-10-CM | POA: Diagnosis not present

## 2023-06-10 ENCOUNTER — Telehealth: Payer: Self-pay

## 2023-06-10 ENCOUNTER — Encounter: Payer: Self-pay | Admitting: Hematology

## 2023-06-10 ENCOUNTER — Inpatient Hospital Stay: Payer: Medicare Other | Attending: Internal Medicine | Admitting: Hematology

## 2023-06-10 DIAGNOSIS — C250 Malignant neoplasm of head of pancreas: Secondary | ICD-10-CM

## 2023-06-10 NOTE — Telephone Encounter (Signed)
Pt called to confirm an appointment that he had on today. I was able to let him know that he has tele visit with Dr. Mosetta Putt today @ 2:20 pm. Pt verbalized understanding.   Donn Zanetti,M CMA

## 2023-06-10 NOTE — Assessment & Plan Note (Signed)
diagnosed in 09/2021, deemed poor surgical candidate due to his age and medical co-morbidities -S/p single agent gemcitabine 01/13/22 - 04/01/22 and 5 fx SBRT [name]) 05/04/22 - 05/14/22. CA 19-9 decreased after treatment -Surveillance CT 11/09/2022 showed slightly decreased size of pancreatic head mass, no evidence of metastatic disease.  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  He is taking ibuprofen as needed.  No other clinical concern for cancer progression. -Lab reviewed, overall stable, tumor marker still pending. -He was admitted for right femoral neck fracture  on 02/03/23 after a fall and had surgery  -CT scan from May 17, 2023 showed multiple lung nodules, with dominant 1.5 cm nodule in the right middle lobe, highly concerning for metastasis.  I personally reviewed the scan images with patient and his daughter. -I discussed option of bronchoscopy and biopsy, versus PET scan.  Patient agrees with PET scan.  I will also obtain tumor markers today. -Patient is interested in chemotherapy if he has disease progression.

## 2023-06-10 NOTE — Progress Notes (Signed)
[name]   [phone] [phone]   Clinic Follow up Note   Patient Care Team: [name] Ishihara, MD as PCP - General (Internal Medicine) [name] [name], [name] Batty, DO as Consulting Physician (Neurology) [name] Coder, MD as Consulting Physician (Hematology and Oncology) [name] Mandes, MD as Consulting Physician (General Surgery) [name] Mood, MD as Consulting Physician (Hematology and Oncology) 06/10/2023  I connected with [name] [name] on 06/10/23 at  2:20 PM EDT by telephone and verified that I am speaking with the correct person using two identifiers.   I discussed the limitations, risks, security and privacy concerns of performing an evaluation and management service by telephone and the availability of in person appointments. I also discussed with the patient that there may be a patient responsible charge related to this service. The patient expressed understanding and agreed to proceed.   Patient's location: Home Provider's location:  Office    CHIEF COMPLAINT: Review PET scan results   CURRENT THERAPY: Pending  Oncology history Pancreatic cancer (HCC) diagnosed in 09/2021, deemed poor surgical candidate due to his age and medical co-morbidities -S/p single agent gemcitabine 01/13/22 - 04/01/22 and 5 fx SBRT [name] [name]) 05/04/22 - 05/14/22. CA 19-9 decreased after treatment -Surveillance CT 11/09/2022 showed slightly decreased size of pancreatic head mass, no evidence of metastatic disease.  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  He is taking ibuprofen as needed.  No other clinical concern for cancer progression. -Lab reviewed, overall stable, tumor marker still pending. -He was admitted for right femoral neck fracture  on 02/03/23 after a fall and had surgery  -CT scan from May 17, 2023 showed multiple lung nodules, with dominant 1.5 cm nodule in the right middle lobe,  highly concerning for metastasis.  I personally reviewed the scan images with patient and his daughter. -I discussed option of bronchoscopy and biopsy, versus PET scan.  Patient agrees with PET scan.  I will also obtain tumor markers today. -Patient is interested in chemotherapy if he has disease progression.  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 Mild activity in the region of the pancreatic head where the primary cancer was located. Tumor marker has increased from 200 to 330, indicating cancer growth. -Patient would like to confirm lung metastasis.  I will arrange lung biopsy to confirm metastasis. Discuss with interventional radiology and pulmonology to determine the best approach for biopsy. - Plan to start chemotherapy once biopsy results are confirmed. Plan to restart gemcitabine every two weeks, similar to previous regimen. - Monitor tumor marker monthly during chemotherapy. - Use peripheral vein for chemotherapy administration due to previous port rejection.  Cough Reported as more of an irritation, no phlegm production. - Monitor symptoms. No specific intervention planned at this time.     Plan -Will discuss with IR and pulmonary regarding his lung biopsy -I will see him back 2 to 3 days after his lung biopsy.    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 Sclerotic 1.5 cm left ischial lesion/ Sclerotic medial left iliac bone 1.5 cm lesion (series 2/image 47), increased from 1.0 cm. Gleason [name] of 4+5= 9; with almost all cores involved greater than 80%.  9/16 Lupron 82-month depot on 9/16. [Urology; [name] [name]]  # MID OCT 2020- Zytiga  1000 mg+ prednisone; stopped December 2021 [poor tolerance if with RVR]; DISCONTINUED.   # Parkinsons's syndrome [[name] [name]; [place]; neurologist]; CKD-III  [creat1.3-1.5]  # GC- referred  # DECLINES- Palliative care [316/2021]  DIAGNOSIS: Prostate cancer  STAGE:     4    ;  GOALS: Palliative/control  CURRENT/MOST RECENT THERAPY : Lupron.       Prostate cancer metastatic to bone (HCC)  05/24/2019 Initial Diagnosis   Prostate cancer metastatic to bone [name] [name])   Pancreatic cancer (HCC)  10/02/2021 Imaging   CT ABDOMEN PELVIS W CONTRAST   IMPRESSION: 1. There is new pancreatic ductal dilation with an indeterminate 19 mm hypodense low-density mass area in the head/uncinate process of the pancreas. Recommend further evaluation with dedicated MRI/MRCP with contrast. 2. No evidence of bowel obstruction.  Moderate rectal stool ball. 3. Scattered peripherally located clustered pulmonary nodules in the RIGHT middle lobe. Findings are likely infectious or inflammatory in etiology. Given history of malignancy, recommend follow-up as per clinical protocol.   10/28/2021 Imaging   MR ABDOMEN MRCP W WO CONTAST   IMPRESSION: 1. Examination is significantly limited by breath motion artifact throughout. 2. The main pancreatic duct is diffusely dilated from the level of the superior pancreatic head, measuring up to 0.7 cm. 3. In the inferior pancreatic head and uncinate, there is a multilobulated, fluid signal cystic lesion measuring 1.8 x 1.0 cm. Due to breath motion artifact it is difficult to determine whether this communicates to the adjacent duct. There are multiple additional subcentimeter cystic lesions scattered throughout the pancreas, several of which clearly communicate to the main pancreatic duct. 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 Recommend EUS/FNA for further diagnosis given the presence of pancreatic ductal dilatation. 4. Status post left nephrectomy. 5. No evidence of recurrent or metastatic disease in the abdomen.   12/25/2021 Procedure   UPPER ENDOSCOPIC ULTRASOUND-By Dr.  Meridee [name]  - A mass-like region was identified in the pancreatic head where the pancreatic duct dilates with multiple cystic regions noted throughout the pancreas as well. 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 However, the endosonographic appearance is suspicious for potential adenocarcinoma. This was staged T2 N0 Mx by endosonographic criteria. T - No malignant-appearing lymph nodes were visualized in the celiac region (level 20), peripancreatic region and porta hepatis region.   12/25/2021 Pathology Results   CYTOLOGY - NON PAP  CASE: [id number]   FINAL MICROSCOPIC DIAGNOSIS:  A. PANCREAS, HEAD, FINE NEEDLE ASPIRATION:  - Malignant cells consistent with adenocarcinoma     01/01/2022 Initial Diagnosis   Pancreatic cancer (HCC)   01/13/2022 - 04/01/2022 Chemotherapy   Patient is on Treatment Plan : PANCREAS Gemcitabine D1,15 q28d x 4 Cycles     01/26/2022 Cancer Staging   Staging form: Exocrine Pancreas, AJCC 8th Edition - Clinical: Stage IB (cT2, cN0, cM0) - Signed by [name] Mood, MD on 01/26/2022 Total positive nodes: 0    Genetic Testing   [facility] CancerNext-Expanded Panel was Negative. Of note, a variant of uncertain significance was identified in the BLM gene (p.N936D) and GALNT12 gene ([id number]). Report date is 01/26/2022.  The CancerNext-Expanded gene panel offered by [facility] and includes sequencing, rearrangement, and RNA analysis for the following 77 genes: AIP, ALK, APC, ATM, AXIN2, BAP1, BARD1, BLM, BMPR1A, BRCA1, BRCA2, BRIP1, CDC73, CDH1, CDK4, CDKN1B, CDKN2A, CHEK2, CTNNA1, DICER1, FANCC, FH, FLCN, GALNT12, KIF1B, LZTR1, MAX, MEN1, MET, MLH1, MSH2, MSH3, MSH6, MUTYH, NBN, NF1, NF2, NTHL1, PALB2, PHOX2B, PMS2, POT1,  PRKAR1A, PTCH1, PTEN, RAD51C, RAD51D, RB1, RECQL, RET, SDHA, SDHAF2, SDHB, SDHC, SDHD, SMAD4, SMARCA4, SMARCB1, SMARCE1, STK11, SUFU, TMEM127, TP53, TSC1, TSC2, VHL and XRCC2 (sequencing and deletion/duplication);  EGFR, EGLN1, HOXB13, KIT, MITF, PDGFRA, POLD1, and POLE (sequencing only); EPCAM and GREM1 (deletion/duplication only).    08/12/2022 Imaging    IMPRESSION: 1. Slight interval increase in size of the pancreatic head-uncinate process mass with increasing direct contact of the SMA and portal vein as discussed. Also with a replaced RIGHT hepatic artery which passes through the tumor. 2. No signs of acute inflammation currently about the pancreas. With similar appearance of ductal obstruction and peripheral atrophy of pancreas due to the tumor. 3. Post LEFT nephrectomy. 4. Sclerotic bony lesions of the LEFT ischium and LEFT iliac bone similar to prior imaging. 5. Mild hyperenhancement of LEFT prostate 14 mm with asymmetry, nonspecific. This is unchanged in this patient with history of prostate neoplasm. 6. Aortic atherosclerosis.     Discussed the use of AI scribe software for clinical note transcription with the patient, who gave verbal consent to proceed.  History of Present Illness   The patient, with a history of pancreatic cancer, presents for a follow-up visit. He reports a mild cough, which he describes as more of an irritation than anything else. He denies any phlegm production or shortness of breath.  The patient underwent a PET scan to evaluate his pancreatic cancer, as a previous CT scan showed some spots in the lung.  The patient has a history of chemotherapy and radiation treatment for his pancreatic cancer.  The patient also mentions a previous experience with a port for chemotherapy administration, which his body rejected. He expresses a preference for peripheral vein administration for future chemotherapy treatments.         REVIEW OF SYSTEMS:   Constitutional: Denies fevers, chills or abnormal weight loss Eyes: Denies blurriness of vision Ears, nose, mouth, throat, and face: Denies mucositis or sore throat Respiratory: Denies cough, dyspnea or  wheezes Cardiovascular: Denies palpitation, chest discomfort or lower extremity swelling Gastrointestinal:  Denies nausea, heartburn or change in bowel habits Skin: Denies abnormal skin rashes Lymphatics: Denies new lymphadenopathy or easy bruising Neurological:Denies numbness, tingling or new weaknesses Behavioral/Psych: [name] is stable, no new changes  All other systems were reviewed with the patient and are negative.  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)    Prostate cancer metastatic to bone (HCC)    REM sleep behavior disorder    Sleep apnea    BiPap    SURGICAL HISTORY: Past Surgical History:  Procedure Laterality Date   ANTERIOR APPROACH HEMI HIP ARTHROPLASTY Right 02/05/2023   Procedure: ANTERIOR APPROACH HEMI HIP ARTHROPLASTY;  Surgeon: Jodi Geralds, MD;  Location: [facility] OR;  Service: Orthopedics;  Laterality: Right;   APPENDECTOMY  1963   BIOPSY  12/25/2021   Procedure: BIOPSY;  Surgeon: Lemar Lofty., MD;  Location: [name];  Service: Gastroenterology;;   COLONOSCOPY  2010, 2015   ESOPHAGOGASTRODUODENOSCOPY N/A 04/16/2022   Procedure: ESOPHAGOGASTRODUODENOSCOPY (EGD);  Surgeon: Lemar Lofty., MD;  Location: [name];  Service: Gastroenterology;  Laterality: N/A;   ESOPHAGOGASTRODUODENOSCOPY (EGD) WITH PROPOFOL N/A 12/25/2021   Procedure: ESOPHAGOGASTRODUODENOSCOPY (EGD) WITH PROPOFOL;  Surgeon:  Mansouraty, Netty Starring., MD;  Location: [facility] ENDOSCOPY;  Service: Gastroenterology;  Laterality: N/A;   EUS N/A 12/25/2021   Procedure: UPPER ENDOSCOPIC ULTRASOUND (EUS) RADIAL;  Surgeon: Lemar Lofty., MD;  Location: [name];  Service: Gastroenterology;  Laterality: N/A;   EUS N/A 04/16/2022    Procedure: UPPER ENDOSCOPIC ULTRASOUND (EUS) RADIAL;  Surgeon: Lemar Lofty., MD;  Location[facility] END[facility]COPY;  Service: Gastroenterology;  Laterality: N/A;   FIDUCIAL MARKER PLACEMENT N/A 04/16/2022   Procedure: FIDUCIAL MARKER PLACEMENT;  Surgeon: Lemar Lofty., MD;  Location: [name];  Service: Gastroenterology;  Laterality: N/A;   FINE NEEDLE ASPIRATION  12/25/2021   Procedure: FINE NEEDLE ASPIRATION (FNA) LINEAR;  Surgeon: Lemar Lofty., MD;  Location: [facility] ENDOSCOPY;  Service: Gastroenterology;;   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   KYPHOPLASTY N/A 08/15/2020   Procedure: L2 compression fracture;  Surgeon: Kennedy Bucker, MD;  Location: [facility] ORS;  Service: Orthopedics;  Laterality: N/A;   KYPHOPLASTY N/A 08/29/2020   Procedure: T8 KYPHOPLASTY;  Surgeon: Kennedy Bucker, MD;  Location: [facility] ORS;  Service: Orthopedics;  Laterality: N/A;   KYPHOPLASTY N/A 11/12/2020   Procedure: L1 KYPHOPLASTY;  Surgeon: Kennedy Bucker, MD;  Location: [facility] ORS;  Service: Orthopedics;  Laterality: N/A;   PORTACATH PLACEMENT N/A 02/05/2022   Procedure: INSERTION PORT-A-CATH;  Surgeon: [name] Mandes, MD;  Location: [facility] ORS;  Service: General;  Laterality: N/A;    I have reviewed the social history and family history with the patient and they are unchanged from previous note.  ALLERGIES:  is allergic to influenza vaccines and influenza virus vaccine.  MEDICATIONS:  Current Outpatient Medications  Medication Sig Dispense Refill   acetaminophen (TYLENOL) 500 MG tablet Take 1 tablet (500 mg total) by mouth every 6 (six) hours as needed for moderate pain or mild pain. 30 tablet 0   bisacodyl (DULCOLAX) 10 MG suppository Place 1 suppository (10 mg total) rectally daily as needed for moderate constipation. 12 suppository 0   carbidopa-levodopa (SINEMET CR) 50-200 MG tablet TAKE 1 TABLET AT BEDTIME 90 tablet 0   carbidopa-levodopa (SINEMET IR) 25-100 MG tablet Take 3 tablets at 7 AM, 2 tablets at 11  AM, 3 tablets at 3 PM, 2 tablets at 7 PM 900 tablet 0   clonazePAM (KLONOPIN) 0.5 MG tablet Take 1 tablet (0.5 mg total) by mouth at bedtime. 5 tablet 0   cyanocobalamin (VITAMIN B12) 1000 MCG tablet Take 1,000 mcg by mouth daily.     diltiazem (CARDIZEM SR) 90 MG 12 hr capsule Take 1 capsule (90 mg total) by mouth 2 (two) times daily. Hold if systolic blood pressure (top number) less than 110 mmHg or pulse less than 55 bpm. 60 capsule 0   ELIQUIS 5 MG TABS tablet TAKE 1 TABLET TWICE A DAY 120 tablet 5   Glycerin-Hypromellose-PEG 400 (VISINE DRY EYE OP) Place 1 drop into both eyes 2 (two) times daily as needed (eye irritation).      ketoconazole (NIZORAL) 2 % shampoo Apply 1 application  topically 3 (three) times a week.     leuprolide, 6 Month, (ELIGARD) 45 MG injection Inject 45 mg into the skin every 6 (six) months.     omeprazole (PRILOSEC) 20 MG capsule Take 1 capsule (20 mg total) by mouth daily. 90 capsule 3   ondansetron (ZOFRAN) 4 MG tablet Take 1 tablet (4 mg total) by mouth every 6 (six) hours as needed for nausea. 20 tablet 0   oxyCODONE (OXY IR/ROXICODONE) 5 MG immediate release tablet  Take 1 tablet (5 mg total) by mouth every 6 (six) hours as needed for severe pain. 30 tablet 0   PANCREAZE 37000-97300 units CPEP TAKE 2 CAPSULES WITH BREAKFAST, LUNCH, AND EVENING MEAL (WITH EACH MEAL) AND 1 CAPSULE WITH SNACK 600 capsule 5   polyethylene glycol powder (MIRALAX) 17 GM/SCOOP powder Take 17 g by mouth in the morning and at bedtime.     rosuvastatin (CRESTOR) 10 MG tablet TAKE 1 TABLET DAILY 90 tablet 3   tiZANidine (ZANAFLEX) 2 MG tablet Take 1 tablet (2 mg total) by mouth every 8 (eight) hours as needed for muscle spasms. 10 tablet 0   triamcinolone cream (KENALOG) 0.1 % Apply 1 application topically 2 (two) times daily. (Patient taking differently: Apply 1 application  topically 2 (two) times daily as needed (itching).) 453.6 g 1   zoledronic acid (RECLAST) 5 MG/100ML SOLN injection  Inject 5 mg into the vein See admin instructions. Once a year     No current facility-administered medications for this visit.    PHYSICAL EXAMINATION: Not performed   LABORATORY DATA:  I have reviewed the data as listed    Latest Ref Rng & Units 05/20/2023    3:03 PM 05/17/2023   11:06 AM 05/08/2023    5:43 PM  CBC  WBC 4.0 - 10.5 K/uL 6.1  4.8  5.6   Hemoglobin 13.0 - 17.0 g/dL 16.1  09.6  04.5   Hematocrit 39.0 - 52.0 % 39.1  41.2  38.7   Platelets 150 - 400 K/uL 199  213  180         Latest Ref Rng & Units 05/20/2023    3:03 PM 05/17/2023   11:06 AM 05/08/2023    5:43 PM  CMP  Glucose 70 - 99 mg/dL 409  86  811   BUN 8 - 23 mg/dL 25  22  21    Creatinine 0.61 - 1.24 mg/dL 9.14  7.82  9.56   Sodium 135 - 145 mmol/L 138  143  139   Potassium 3.5 - 5.1 mmol/L 4.9  4.3  3.9   Chloride 98 - 111 mmol/L 108  108  108   CO2 22 - 32 mmol/L 26  29  21    Calcium 8.9 - 10.3 mg/dL 9.9  9.8  8.7   Total Protein 6.5 - 8.1 g/dL 7.5  7.1    Total Bilirubin 0.3 - 1.2 mg/dL 0.8  0.7    Alkaline Phos 38 - 126 U/L 69  67    AST 15 - 41 U/L 25  24    ALT 0 - 44 U/L 7  <5        RADIOGRAPHIC STUDIES: I have personally reviewed the radiological images as listed and agreed with the findings in the report. No results found.     I discussed the assessment and treatment plan with the patient. The patient was provided an opportunity to ask questions and all were answered. The patient agreed with the plan and demonstrated an understanding of the instructions.   The patient was advised to call back or seek an in-person evaluation if the symptoms worsen or if the condition fails to improve as anticipated.  I provided 22 minutes of non face-to-face telephone visit time during this encounter, and > 50% was spent counseling as documented under my assessment & plan.     [name] Mood, MD 06/10/23

## 2023-06-11 DIAGNOSIS — Z96641 Presence of right artificial hip joint: Secondary | ICD-10-CM | POA: Diagnosis not present

## 2023-06-11 DIAGNOSIS — R2689 Other abnormalities of gait and mobility: Secondary | ICD-10-CM | POA: Diagnosis not present

## 2023-06-11 DIAGNOSIS — M25551 Pain in right hip: Secondary | ICD-10-CM | POA: Diagnosis not present

## 2023-06-11 DIAGNOSIS — R2681 Unsteadiness on feet: Secondary | ICD-10-CM | POA: Diagnosis not present

## 2023-06-11 DIAGNOSIS — M62551 Muscle wasting and atrophy, not elsewhere classified, right thigh: Secondary | ICD-10-CM | POA: Diagnosis not present

## 2023-06-11 DIAGNOSIS — S72001S Fracture of unspecified part of neck of right femur, sequela: Secondary | ICD-10-CM | POA: Diagnosis not present

## 2023-06-14 ENCOUNTER — Other Ambulatory Visit: Payer: Self-pay

## 2023-06-14 DIAGNOSIS — R2681 Unsteadiness on feet: Secondary | ICD-10-CM | POA: Diagnosis not present

## 2023-06-14 DIAGNOSIS — C61 Malignant neoplasm of prostate: Secondary | ICD-10-CM

## 2023-06-14 DIAGNOSIS — M25551 Pain in right hip: Secondary | ICD-10-CM | POA: Diagnosis not present

## 2023-06-14 DIAGNOSIS — R2689 Other abnormalities of gait and mobility: Secondary | ICD-10-CM | POA: Diagnosis not present

## 2023-06-14 DIAGNOSIS — M62551 Muscle wasting and atrophy, not elsewhere classified, right thigh: Secondary | ICD-10-CM | POA: Diagnosis not present

## 2023-06-14 DIAGNOSIS — S72001S Fracture of unspecified part of neck of right femur, sequela: Secondary | ICD-10-CM | POA: Diagnosis not present

## 2023-06-14 DIAGNOSIS — C259 Malignant neoplasm of pancreas, unspecified: Secondary | ICD-10-CM

## 2023-06-14 DIAGNOSIS — Z96641 Presence of right artificial hip joint: Secondary | ICD-10-CM | POA: Diagnosis not present

## 2023-06-14 DIAGNOSIS — J984 Other disorders of lung: Secondary | ICD-10-CM

## 2023-06-14 DIAGNOSIS — C250 Malignant neoplasm of head of pancreas: Secondary | ICD-10-CM

## 2023-06-14 DIAGNOSIS — L600 Ingrowing nail: Secondary | ICD-10-CM | POA: Diagnosis not present

## 2023-06-14 DIAGNOSIS — B351 Tinea unguium: Secondary | ICD-10-CM | POA: Diagnosis not present

## 2023-06-14 NOTE — Progress Notes (Signed)
Ordered given by Dr. Mosetta Putt in Secure Chat for the following:  [name] [name], could you refer him to pulmonary Dr. Tonia Brooms or Dr. Delton Coombes for bronchoscopy biopsy?   Bronchoscopy biopsy recommended by Dr. Raelene Bott in Secure Chat dated 06/14/2023 d/t:  [name] Balm, MD Malachy Mood, MD Cc: [name] [name] D These small periph lesions are increase risk for pneumothorax with percutaneous approach. Consider pulm consult for bronch biopsy Thanks DDH     Referral order placed and sent to Dr. Myrlene Broker office.

## 2023-06-14 NOTE — Progress Notes (Unsigned)
Oley Balm, MD  Malachy Mood, MD Cc: Leodis Rains D These small periph lesions are increase risk for pneumothorax with percutaneous approach. Consider pulm consult for bronch biopsy Thanks DDH

## 2023-06-15 ENCOUNTER — Ambulatory Visit: Payer: Medicare Other | Admitting: Cardiology

## 2023-06-16 ENCOUNTER — Ambulatory Visit: Payer: Medicare Other | Attending: Cardiology | Admitting: Cardiology

## 2023-06-16 ENCOUNTER — Encounter: Payer: Self-pay | Admitting: Cardiology

## 2023-06-16 VITALS — BP 108/65 | HR 58 | Resp 16 | Ht 65.0 in | Wt 147.6 lb

## 2023-06-16 DIAGNOSIS — I4892 Unspecified atrial flutter: Secondary | ICD-10-CM | POA: Diagnosis not present

## 2023-06-16 DIAGNOSIS — Z7901 Long term (current) use of anticoagulants: Secondary | ICD-10-CM

## 2023-06-16 DIAGNOSIS — G4733 Obstructive sleep apnea (adult) (pediatric): Secondary | ICD-10-CM | POA: Diagnosis not present

## 2023-06-16 DIAGNOSIS — I4891 Unspecified atrial fibrillation: Secondary | ICD-10-CM

## 2023-06-16 DIAGNOSIS — M62551 Muscle wasting and atrophy, not elsewhere classified, right thigh: Secondary | ICD-10-CM | POA: Diagnosis not present

## 2023-06-16 DIAGNOSIS — E782 Mixed hyperlipidemia: Secondary | ICD-10-CM

## 2023-06-16 DIAGNOSIS — R2689 Other abnormalities of gait and mobility: Secondary | ICD-10-CM | POA: Diagnosis not present

## 2023-06-16 DIAGNOSIS — M25551 Pain in right hip: Secondary | ICD-10-CM | POA: Diagnosis not present

## 2023-06-16 DIAGNOSIS — Z96641 Presence of right artificial hip joint: Secondary | ICD-10-CM | POA: Diagnosis not present

## 2023-06-16 DIAGNOSIS — I700 Atherosclerosis of aorta: Secondary | ICD-10-CM

## 2023-06-16 DIAGNOSIS — R2681 Unsteadiness on feet: Secondary | ICD-10-CM | POA: Diagnosis not present

## 2023-06-16 DIAGNOSIS — S72001S Fracture of unspecified part of neck of right femur, sequela: Secondary | ICD-10-CM | POA: Diagnosis not present

## 2023-06-16 MED ORDER — DILTIAZEM HCL 30 MG PO TABS
30.0000 mg | ORAL_TABLET | Freq: Two times a day (BID) | ORAL | 3 refills | Status: DC
Start: 2023-06-16 — End: 2023-09-03

## 2023-06-16 MED ORDER — DILTIAZEM HCL 30 MG PO TABS: 30.0000 mg | ORAL_TABLET | Freq: Two times a day (BID) | ORAL | 3 refills | Status: DC

## 2023-06-16 NOTE — Progress Notes (Signed)
Cardiology Office Note:  .   Date:  06/16/2023  ID:  [name] [name], DOB Mar 18, 1941, MRN [id number] PCP:  Lorenda Ishihara, MD  Former Cardiology Providers: Marcina Millard, MD  [facility] Providers Cardiologist:  Tessa Lerner, DO , [facility] (established care 04/10/2021) Electrophysiologist:  None  Click to update primary MD,subspecialty MD or APP then REFRESH:1}    History of Present Illness: .   [name] [name] is a 82 y.o. 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   Patient being followed by the practice given his history of atrial flutter/fibrillation.  Since last office visit he had a ED visit due to A-fib with RVR.  Because he was appropriately anticoagulated he underwent direct-current cardioversion and successfully restored normal sinus rhythm.  His diltiazem was reduced to 60 mg p.o. twice daily with holding parameters.  Since his recent ER visit he has been stable from a cardiovascular standpoint.  Patient states that even though the dose of diltiazem has been reduced he still ends up holding many of the doses due to the holding parameters to avoid hypotension.  Review of Systems: .   Review of Systems  Constitutional: Positive for malaise/fatigue.  Cardiovascular:  Negative for chest pain, claudication, dyspnea on exertion, irregular heartbeat, leg swelling, near-syncope, orthopnea, palpitations, paroxysmal nocturnal dyspnea and syncope.  Respiratory:  Negative for shortness of breath.   Hematologic/Lymphatic: Negative for bleeding problem.  Musculoskeletal:  Positive for back pain. Negative for muscle cramps and myalgias.  Neurological:  Negative for dizziness and light-headedness.     Studies Reviewed:   EKG: EKG Interpretation Date/Time:  Wednesday June 16 2023 14:57:17 EDT Ventricular Rate:  52 PR Interval:  212 QRS Duration:  130 QT Interval:  490 QTC Calculation: 455 R Axis:   -25  Text Interpretation: Sinus bradycardia with 1st degree A-V block Right bundle branch block When compared with ECG of 09-May-2023 00:40, Vent. rate has decreased BY  56 BPM ST no longer depressed in Anterior leads T wave inversion no longer evident in Anterior leads QT has shortened Confirmed by Tessa Lerner [id number]) on 06/16/2023 3:05:36 PM  Echocardiogram: 04/30/2021: Normal LV systolic function with EF 63%. Moderate concentric hypertrophy of the left ventricle. Normal global wall motion. Left ventricle cavity is normal in size.  Doppler evidence of grade II (pseudonormal) diastolic dysfunction, elevated LAP.  Left atrial cavity is moderately dilated. Trileaflet aortic valve. Moderate (Grade II) aortic regurgitation. Mild to moderate mitral regurgitation. Mild tricuspid regurgitation.  No evidence of pulmonary hypertension.   Stress Testing: Lexiscan Tetrofosmin stress test 04/30/2021: 1 Day Rest/Stress Protocol. Stress EKG is non-diagnostic for ischemia as its a pharmacologic stress test using Lexiscan. Normal myocardial perfusion without convincing evidence of reversible myocardial ischemia or prior infarct.   Left ventricular ejection fraction is 55% with normal wall motion.   Low risk study. No prior studies for comparison.    Heart Catheterization: None   Cardiac monitor [facility]): December 23, 2022 -Jan 06, 2023 Dominant rhythm sinus, followed by atrial fibrillation/flutter (burden 7%). Overall heart rate 36-179 bpm. Avg HR 62 bpm. Sinus heart rate 36 - 119 bpm. Avg HR 58 bpm. No atrial fibrillation, high grade AV block, pauses (3 seconds or longer). Total ventricular ectopic burden <1%. 1 episode of NSVT (auto triggered), on  12/24/2022 at 6:19 pm, 5 beats, 2.6  seconds duration, max HR 126 bpm.  Total supraventricular ectopic burden 4% (predominantly as isolated beats).  Atrial fibrillation/flutter burden 7% during the monitoring period.  Majority of the episodes were atrial flutter.  Fastest episode occurred on 01/02/2023 at 10:52 am max HR 179 bpm.  Longest episode of atrial flutter 22hr 56 min on 01/02/2023.  Patient triggered events: 3. Underlying rhythm sinus with occasional PACs.   RADIOLOGY: NA   Risk Assessment/Calculations:   Click Here to Calculate/Change CHADS2VASc Score The patient's CHADS2-VASc score is 3, indicating a 3.2% annual risk of stroke.  CHF History: No HTN History: No Diabetes History: No Stroke History: No Vascular Disease History: Yes    Labs:       Latest Ref Rng & Units 05/20/2023    3:03 PM 05/17/2023   11:06 AM 05/08/2023    5:43 PM  CBC  WBC 4.0 - 10.5 K/uL 6.1  4.8  5.6   Hemoglobin 13.0 - 17.0 g/dL 62.1  30.8  65.7   Hematocrit 39.0 - 52.0 % 39.1  41.2  38.7   Platelets 150 - 400 K/uL 199  213  180        Latest Ref Rng & Units 05/20/2023    3:03 PM 05/17/2023   11:06 AM 05/08/2023    5:43 PM  BMP  Glucose 70 - 99 mg/dL 846  86  962   BUN 8 - 23 mg/dL 25  22  21    Creatinine 0.61 - 1.24 mg/dL 9.52  8.41  3.24   Sodium 135 - 145 mmol/L 138  143  139   Potassium 3.5 - 5.1 mmol/L 4.9  4.3  3.9   Chloride 98 - 111 mmol/L 108  108  108   CO2 22 - 32 mmol/L 26  29  21    Calcium 8.9 - 10.3 mg/dL 9.9  9.8  8.7       Latest Ref Rng & Units 05/20/2023    3:03 PM 05/17/2023   11:06 AM 05/08/2023    5:43 PM  CMP  Glucose 70 - 99 mg/dL 401  86  027   BUN 8 - 23 mg/dL 25  22  21    Creatinine 0.61 - 1.24 mg/dL 2.53  6.64  4.03   Sodium 135 - 145 mmol/L 138  143  139   Potassium 3.5 - 5.1 mmol/L 4.9  4.3  3.9   Chloride 98 - 111 mmol/L 108  108  108   CO2 22 - 32 mmol/L 26  29  21    Calcium 8.9 - 10.3 mg/dL 9.9  9.8  8.7   Total Protein 6.5 - 8.1 g/dL 7.5  7.1    Total Bilirubin 0.3 - 1.2 mg/dL 0.8  0.7     Alkaline Phos 38 - 126 U/L 69  67    AST 15 - 41 U/L 25  24    ALT 0 - 44 U/L 7  <5      Lab Results  Component Value Date   CHOL 124 12/04/2021   HDL 66 12/04/2021   LDLCALC 34 12/04/2021   LDLDIRECT 43 12/04/2021   TRIG 146 12/04/2021   CHOLHDL 3.0 08/17/2019   No results for input(s): "LIPOA" in the last 8760 hours. No components found for: "NTPROBNP" No results for input(s): "PROBNP" in the last 8760 hours. No results for input(s): "TSH" in the last 8760 hours.   Physical Exam:    Today's Vitals  06/16/23 1453 06/16/23 1520  BP: 96/60 108/65  Pulse: (!) 52 (!) 58  Resp: 16   SpO2: 95%   Weight: 147 lb 9.6 oz (67 kg)   Height: 5\' 5"  (1.651 m)    Body mass index is 24.56 kg/m. Wt Readings from Last 3 Encounters:  06/16/23 147 lb 9.6 oz (67 kg)  05/20/23 151 lb 12.8 oz (68.9 kg)  05/18/23 150 lb 9.6 oz (68.3 kg)    Physical Exam  Constitutional: No distress. He appears chronically ill.   hemodynamically stable, ambulates w/ walker  Neck: No JVD present.  Cardiovascular: Regular rhythm, S1 normal, S2 normal, intact distal pulses and normal pulses. Bradycardia present. Exam reveals no gallop, no S3 and no S4.  Murmur heard. Holosystolic murmur is present with a grade of 3/6 at the apex. Pulses:      Dorsalis pedis pulses are 2+ on the right side and 2+ on the left side.       Posterior tibial pulses are 2+ on the right side and 2+ on the left side.  Pulmonary/Chest: Effort normal and breath sounds normal. No stridor. He has no wheezes. He has no rales.  Abdominal: Soft. Bowel sounds are normal. He exhibits no distension. There is no abdominal tenderness.  Musculoskeletal:        General: No edema.     Cervical back: Neck supple.  Neurological: He is alert and oriented to person, place, and time. He has intact cranial nerves (2-12).  Skin: Skin is warm and moist.     Impression & Recommendation(s):  Impression:   ICD-10-CM   1. Paroxysmal atrial  fibrillation and flutter (HCC)  I48.91 EKG 12-Lead   I48.92 diltiazem (CARDIZEM) 30 MG tablet    2. Long term (current) use of anticoagulants  Z79.01     3. Atherosclerosis of aorta (HCC)  I70.0     4. OSA treated with BiPAP  G47.33     5. Mixed hyperlipidemia  E78.2        Recommendation(s):  Paroxysmal atrial fibrillation and flutter (HCC) Currently normal sinus rhythm. Had an ED visit on 05/09/2023 due to symptomatic atrial fibrillation and underwent direct-current cardioversion with 150 J x 1 and converted to sinus rhythm. Rate control: Diltiazem. Rhythm control: N/A. Thromboembolic prophylaxis: Eliquis. Diagnosed in 2020. He was originally on diltiazem 90 mg p.o. twice -but ended up holding significant number of doses given his soft blood pressures. During his last ED visit he was given diltiazem 60 mg p.o. twice daily and still he is holding significant number of doses due to soft blood pressures. In order to ensure that he is able to tolerate some degree of AV nodal blocking agents will reduce diltiazem dose to 30 mg p.o. twice daily with holding parameters. Continue conservative management.  Long term (current) use of anticoagulants Does not endorse evidence of bleeding. Risks, benefits, and alternatives to anticoagulation rediscussed. Currently he is on that appropriate dose of Eliquis.  However if his weight or less than 132 pounds the dose of Eliquis will need to be reduced (patient and daughter have both been educated on prior office visits).  OSA treated with BiPAP Patient endorses that he is compliant with BiPAP therapy   Orders Placed:  Orders Placed This Encounter  Procedures   EKG 12-Lead    As part of medical decision making results of the ED documentation, recent direct-current cardioversion report, labs from September 2024, EKG were reviewed independently at today's visit.   Final Medication List:  Meds ordered this encounter  Medications   DISCONTD:  diltiazem (CARDIZEM) 30 MG tablet    Sig: Take 1 tablet (30 mg total) by mouth 2 (two) times daily.    Dispense:  60 tablet    Refill:  3    Decreasing dose to 30 mg BID twice daily   diltiazem (CARDIZEM) 30 MG tablet    Sig: Take 1 tablet (30 mg total) by mouth 2 (two) times daily. Do not take if systolic blood pressure (top number) is less than 100, or if pulse if less than 55.    Dispense:  60 tablet    Refill:  3    Decreasing dose to 30 mg BID twice daily    Medications Discontinued During This Encounter  Medication Reason   diltiazem (CARDIZEM SR) 90 MG 12 hr capsule Dose change   diltiazem (CARDIZEM) 30 MG tablet      Current Outpatient Medications:    acetaminophen (TYLENOL) 500 MG tablet, Take 1 tablet (500 mg total) by mouth every 6 (six) hours as needed for moderate pain or mild pain., Disp: 30 tablet, Rfl: 0   bisacodyl (DULCOLAX) 10 MG suppository, Place 1 suppository (10 mg total) rectally daily as needed for moderate constipation., Disp: 12 suppository, Rfl: 0   carbidopa-levodopa (SINEMET CR) 50-200 MG tablet, TAKE 1 TABLET AT BEDTIME, Disp: 90 tablet, Rfl: 0   carbidopa-levodopa (SINEMET IR) 25-100 MG tablet, Take 3 tablets at 7 AM, 2 tablets at 11 AM, 3 tablets at 3 PM, 2 tablets at 7 PM, Disp: 900 tablet, Rfl: 0   clonazePAM (KLONOPIN) 0.5 MG tablet, Take 1 tablet (0.5 mg total) by mouth at bedtime., Disp: 5 tablet, Rfl: 0   cyanocobalamin (VITAMIN B12) 1000 MCG tablet, Take 1,000 mcg by mouth daily., Disp: , Rfl:    ELIQUIS 5 MG TABS tablet, TAKE 1 TABLET TWICE A DAY, Disp: 120 tablet, Rfl: 5   Glycerin-Hypromellose-PEG 400 (VISINE DRY EYE OP), Place 1 drop into both eyes 2 (two) times daily as needed (eye irritation). , Disp: , Rfl:    ketoconazole (NIZORAL) 2 % shampoo, Apply 1 application  topically 3 (three) times a week., Disp: , Rfl:    leuprolide, 6 Month, (ELIGARD) 45 MG injection, Inject 45 mg into the skin every 6 (six) months., Disp: , Rfl:    omeprazole  (PRILOSEC) 20 MG capsule, Take 1 capsule (20 mg total) by mouth daily., Disp: 90 capsule, Rfl: 3   ondansetron (ZOFRAN) 4 MG tablet, Take 1 tablet (4 mg total) by mouth every 6 (six) hours as needed for nausea., Disp: 20 tablet, Rfl: 0   oxyCODONE (OXY IR/ROXICODONE) 5 MG immediate release tablet, Take 1 tablet (5 mg total) by mouth every 6 (six) hours as needed for severe pain., Disp: 30 tablet, Rfl: 0   PANCREAZE 37000-97300 units CPEP, TAKE 2 CAPSULES WITH BREAKFAST, LUNCH, AND EVENING MEAL (WITH EACH MEAL) AND 1 CAPSULE WITH SNACK, Disp: 600 capsule, Rfl: 5   polyethylene glycol powder (MIRALAX) 17 GM/SCOOP powder, Take 17 g by mouth in the morning and at bedtime., Disp: , Rfl:    rosuvastatin (CRESTOR) 10 MG tablet, TAKE 1 TABLET DAILY, Disp: 90 tablet, Rfl: 3   tiZANidine (ZANAFLEX) 2 MG tablet, Take 1 tablet (2 mg total) by mouth every 8 (eight) hours as needed for muscle spasms., Disp: 10 tablet, Rfl: 0   triamcinolone cream (KENALOG) 0.1 %, Apply 1 application topically 2 (two) times daily. (Patient taking differently: Apply 1 application  topically 2 (two) times daily as needed (itching).), Disp: 453.6 g, Rfl: 1   zoledronic acid (RECLAST) 5 MG/100ML SOLN injection, Inject 5 mg into the vein See admin instructions. Once a year, Disp: , Rfl:    diltiazem (CARDIZEM) 30 MG tablet, Take 1 tablet (30 mg total) by mouth 2 (two) times daily. Do not take if systolic blood pressure (top number) is less than 100, or if pulse if less than 55., Disp: 60 tablet, Rfl: 3  Consent:      N/A  Disposition:   Return in about 6 months (around 12/15/2023) for A. fib, Atrial flutter. or sooner if needed.  His questions and concerns were addressed to his satisfaction. He voices understanding of the recommendations provided during this encounter.    Signed, Tessa Lerner, DO, [facility]  [facility]  [address] [place], [place] [address] [phone] 06/16/2023 6:09 PM

## 2023-06-16 NOTE — Patient Instructions (Signed)
Medication Instructions:  DECREASE Diltiazem (Cardizem) to 30 mg twice per day  *do NOT take if systolic blood pressure (top number) less than 100  *do NOT take if pulse less than 55  Lab Work: None ordered If you have labs (blood work) drawn today and your tests are completely normal, you will receive your results only by: [employer] (if you have MyChart) OR A paper copy in the mail If you have any lab test that is abnormal or we need to change your treatment, we will call you to review the results.  Testing/Procedures: None ordered  Follow-Up: At [facility], you and your health needs are our priority.  As part of our continuing mission to provide you with exceptional heart care, we have created designated Provider Care Teams.  These Care Teams include your primary Cardiologist (physician) and Advanced Practice Providers (APPs -  Physician Assistants and Nurse Practitioners) who all work together to provide you with the care you need, when you need it.  We recommend signing up for the patient portal called "MyChart".  Sign up information is provided on this After Visit Summary.  MyChart is used to connect with patients for Virtual Visits (Telemedicine).  Patients are able to view lab/test results, encounter notes, upcoming appointments, etc.  Non-urgent messages can be sent to your provider as well.   To learn more about what you can do with MyChart, go to [email or web address].    Your next appointment:   6 month(s)  The format for your next appointment:   In Person  Provider:   Tessa Lerner, DO {

## 2023-06-17 ENCOUNTER — Other Ambulatory Visit: Payer: Self-pay

## 2023-06-18 ENCOUNTER — Encounter: Payer: Self-pay | Admitting: Internal Medicine

## 2023-06-18 NOTE — Progress Notes (Signed)
The proposed treatment discussed in conference is for discussion purpose only and is not a binding recommendation.  The patients have not been physically examined, or presented with their treatment options.  Therefore, final treatment plans cannot be decided.  

## 2023-06-22 DIAGNOSIS — R2681 Unsteadiness on feet: Secondary | ICD-10-CM | POA: Diagnosis not present

## 2023-06-22 DIAGNOSIS — S72001S Fracture of unspecified part of neck of right femur, sequela: Secondary | ICD-10-CM | POA: Diagnosis not present

## 2023-06-22 DIAGNOSIS — M25551 Pain in right hip: Secondary | ICD-10-CM | POA: Diagnosis not present

## 2023-06-22 DIAGNOSIS — M62551 Muscle wasting and atrophy, not elsewhere classified, right thigh: Secondary | ICD-10-CM | POA: Diagnosis not present

## 2023-06-22 DIAGNOSIS — Z96641 Presence of right artificial hip joint: Secondary | ICD-10-CM | POA: Diagnosis not present

## 2023-06-22 DIAGNOSIS — R2689 Other abnormalities of gait and mobility: Secondary | ICD-10-CM | POA: Diagnosis not present

## 2023-06-24 ENCOUNTER — Encounter: Payer: Self-pay | Admitting: Pulmonary Disease

## 2023-06-24 ENCOUNTER — Ambulatory Visit (INDEPENDENT_AMBULATORY_CARE_PROVIDER_SITE_OTHER): Payer: Medicare Other | Admitting: Pulmonary Disease

## 2023-06-24 VITALS — BP 110/74 | HR 56 | Temp 97.4°F | Ht 64.0 in | Wt 150.8 lb

## 2023-06-24 DIAGNOSIS — C250 Malignant neoplasm of head of pancreas: Secondary | ICD-10-CM

## 2023-06-24 DIAGNOSIS — R918 Other nonspecific abnormal finding of lung field: Secondary | ICD-10-CM | POA: Insufficient documentation

## 2023-06-24 DIAGNOSIS — Z87891 Personal history of nicotine dependence: Secondary | ICD-10-CM | POA: Diagnosis not present

## 2023-06-24 NOTE — Patient Instructions (Signed)
Thank you for visiting Dr. Tonia Brooms at [facility]. Today we recommend the following: Orders Placed This Encounter  Procedures   Procedural/ Surgical Case Request: ROBOTIC ASSISTED NAVIGATIONAL BRONCHOSCOPY   Ambulatory referral to Pulmonology    Return in about 18 days (around 07/12/2023) for with Kandice Robinsons, NP, after Bronchoscopy.    Please do your part to reduce the spread of COVID-19.

## 2023-06-24 NOTE — H&P (View-Only) (Signed)
Synopsis: Referred in October 2024 for multiple pulmonary nodules by Malachy Mood, MD  Subjective:   PATIENT ID: [name] [name] GENDER: male DOB: [birth year], MRN: [id number]  Chief Complaint  Patient presents with   Consult    Review PET scan.  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  Patient followed by Dr. Mosetta Putt from medical oncology.  Patient also has atrial flutter fibrillation on Eliquis.  Presents today with abnormal CT imaging and abnormal pet imaging.  Patient found to have hypermetabolic lesion within the right lung concerning for metastasis also smaller lesions that are within the bilateral lower lobes of the lung concerning for metastasis.  Patient was referred here for consideration of robotic assisted navigational bronchoscopy and tissue sampling.  Patient has been discussed with radiation oncology as well as interventional radiology.  Decision was made for bronchoscopy and biopsy.  He is accompanied today by his 2 sisters.    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)    Prostate cancer metastatic to bone (HCC)    REM sleep behavior disorder    Sleep apnea    BiPap     Family History  Problem Relation Age of Onset   Heart disease Maternal Grandfather    Diabetes Paternal Grandfather    Breast cancer Daughter      Past Surgical History:  Procedure Laterality Date   ANTERIOR APPROACH HEMI HIP ARTHROPLASTY Right 02/05/2023   Procedure: ANTERIOR APPROACH HEMI HIP ARTHROPLASTY;  Surgeon: Jodi Geralds, MD;  Location: [facility] OR;  Service: Orthopedics;  Laterality: Right;    APPENDECTOMY  1963   BIOPSY  12/25/2021   Procedure: BIOPSY;  Surgeon: Lemar Lofty., MD;  Location: [facility] ENDOSCOPY;  Service: Gastroenterology;;   COLONOSCOPY  2010, 2015   ESOPHAGOGASTRODUODENOSCOPY N/A 04/16/2022   Procedure: ESOPHAGOGASTRODUODENOSCOPY (EGD);  Surgeon: Lemar Lofty., MD;  Location: [facility] ENDOSCOPY;  Service: Gastroenterology;  Laterality: N/A;   ESOPHAGOGASTRODUODENOSCOPY (EGD) WITH PROPOFOL N/A 12/25/2021   Procedure: ESOPHAGOGASTRODUODENOSCOPY (EGD) WITH PROPOFOL;  Surgeon: Meridee Score Netty Starring., MD;  Location: [facility] ENDOSCOPY;  Service: Gastroenterology;  Laterality: N/A;   EUS N/A 12/25/2021   Procedure: UPPER ENDOSCOPIC ULTRASOUND (EUS) RADIAL;  Surgeon: Lemar Lofty., MD;  Location: [facility] ENDOSCOPY;  Service: Gastroenterology;  Laterality: N/A;   EUS N/A 04/16/2022   Procedure: UPPER ENDOSCOPIC ULTRASOUND (EUS) RADIAL;  Surgeon: Lemar Lofty., MD;  Location[facility] ENDOSCOPY;  Service: Gastroenterology;  Laterality: N/A;   FIDUCIAL MARKER PLACEMENT N/A 04/16/2022   Procedure: FIDUCIAL MARKER PLACEMENT;  Surgeon: Lemar Lofty., MD;  Location: [facility] ENDOSCOPY;  Service: Gastroenterology;  Laterality: N/A;   FINE NEEDLE ASPIRATION  12/25/2021   Procedure: FINE NEEDLE ASPIRATION (FNA) LINEAR;  Surgeon: Lemar Lofty., MD;  Location: [facility] ENDOSCOPY;  Service: Gastroenterology;;   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   KYPHOPLASTY N/A 08/15/2020   Procedure: L2 compression fracture;  Surgeon: Kennedy Bucker, MD;  Location: [facility] ORS;  Service: Orthopedics;  Laterality: N/A;   KYPHOPLASTY N/A 08/29/2020   Procedure: T8 KYPHOPLASTY;  Surgeon: Kennedy Bucker, MD;  Location: [facility] ORS;  Service: Orthopedics;  Laterality: N/A;  KYPHOPLASTY N/A 11/12/2020   Procedure: L1 KYPHOPLASTY;  Surgeon: Kennedy Bucker, MD;  Location: [facility] ORS;  Service: Orthopedics;  Laterality: N/A;   PORTACATH PLACEMENT N/A 02/05/2022   Procedure: INSERTION PORT-A-CATH;  Surgeon: Fritzi Mandes, MD;  Location: [facility] ORS;  Service: General;  Laterality: N/A;    Social History   Socioeconomic History   Marital status: Widowed    Spouse name: Genette   Number of children: 5   Years of education: Not on file   Highest education level: Not on file  Occupational History   Occupation: retired    Comment: Art therapist  Tobacco Use   Smoking status: Former    Types: Cigars    Quit date: 09/07/2010    Years since quitting: 12.8   Smokeless tobacco: Never  Vaping Use   Vaping status: Never Used  Substance and Sexual Activity   Alcohol use: Not Currently    Comment: 1 can beer/day. occas.   Drug use: Never   Sexual activity: Not Currently  Other Topics Concern   Not on file  Social History Narrative   [place] in [place]; quit smoking in 2012; ocassional alcohol; Oncologist; wife; 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    Right handed   Two story home   Social Determinants of Health   Financial Resource Strain: Not on file  Food Insecurity: Not on file  Transportation Needs: Not on file  Physical Activity: Not on file  Stress: Not on file  Social Connections: Not on file  Intimate Partner Violence: Not on file     Allergies  Allergen Reactions   Influenza Vaccines Anaphylaxis    Flu shot: 1974 anaphylaxis. 2nd time swollen arm   Influenza Virus Vaccine Other (See Comments)     Outpatient Medications Prior to Visit  Medication Sig Dispense Refill   acetaminophen (TYLENOL) 500 MG tablet Take 1 tablet (500 mg total) by mouth every 6 (six) hours as needed for moderate pain or mild pain. 30 tablet 0   bisacodyl (DULCOLAX) 10 MG suppository Place 1 suppository (10 mg total) rectally daily as needed for moderate constipation. 12 suppository 0   carbidopa-levodopa (SINEMET CR) 50-200 MG tablet TAKE 1 TABLET AT BEDTIME 90 tablet 0   carbidopa-levodopa (SINEMET IR) 25-100 MG tablet Take 3 tablets at 7 AM, 2 tablets at 11 AM, 3 tablets at 3 PM, 2 tablets at 7 PM 900  tablet 0   clonazePAM (KLONOPIN) 0.5 MG tablet Take 1 tablet (0.5 mg total) by mouth at bedtime. 5 tablet 0   cyanocobalamin (VITAMIN B12) 1000 MCG tablet Take 1,000 mcg by mouth daily.     diltiazem (CARDIZEM) 30 MG tablet Take 1 tablet (30 mg total) by mouth 2 (two) times daily. Do not take if systolic blood pressure (top number) is less than 100, or if pulse if less than 55. 60 tablet 3   ELIQUIS 5 MG TABS tablet TAKE 1 TABLET TWICE A DAY 120 tablet 5   Glycerin-Hypromellose-PEG 400 (VISINE DRY EYE OP) Place 1 drop into both eyes 2 (two) times daily as needed (eye irritation).      ketoconazole (NIZORAL) 2 % shampoo Apply 1 application  topically 3 (three) times a week.     leuprolide, 6 Month, (ELIGARD) 45 MG injection Inject 45 mg into the skin every 6 (six) months.     omeprazole (PRILOSEC) 20 MG capsule Take 1 capsule (20 mg total) by mouth daily. 90 capsule 3   ondansetron (ZOFRAN) 4 MG tablet  Take 1 tablet (4 mg total) by mouth every 6 (six) hours as needed for nausea. 20 tablet 0   oxyCODONE (OXY IR/ROXICODONE) 5 MG immediate release tablet Take 1 tablet (5 mg total) by mouth every 6 (six) hours as needed for severe pain. 30 tablet 0   PANCREAZE 37000-97300 units CPEP TAKE 2 CAPSULES WITH BREAKFAST, LUNCH, AND EVENING MEAL (WITH EACH MEAL) AND 1 CAPSULE WITH SNACK 600 capsule 5   polyethylene glycol powder (MIRALAX) 17 GM/SCOOP powder Take 17 g by mouth in the morning and at bedtime.     rosuvastatin (CRESTOR) 10 MG tablet TAKE 1 TABLET DAILY 90 tablet 3   tiZANidine (ZANAFLEX) 2 MG tablet Take 1 tablet (2 mg total) by mouth every 8 (eight) hours as needed for muscle spasms. 10 tablet 0   triamcinolone cream (KENALOG) 0.1 % Apply 1 application topically 2 (two) times daily. (Patient taking differently: Apply 1 application  topically 2 (two) times daily as needed (itching).) 453.6 g 1   zoledronic acid (RECLAST) 5 MG/100ML SOLN injection Inject 5 mg into the vein See admin instructions. Once  a year     No facility-administered medications prior to visit.    Review of Systems  Constitutional:  Negative for chills, fever, malaise/fatigue and weight loss.  HENT:  Negative for hearing loss, sore throat and tinnitus.   Eyes:  Negative for blurred vision and double vision.  Respiratory:  Negative for cough, hemoptysis, sputum production, shortness of breath, wheezing and stridor.   Cardiovascular:  Negative for chest pain, palpitations, orthopnea, leg swelling and PND.  Gastrointestinal:  Negative for abdominal pain, constipation, diarrhea, heartburn, nausea and vomiting.  Genitourinary:  Negative for dysuria, hematuria and urgency.  Musculoskeletal:  Negative for joint pain and myalgias.  Skin:  Negative for itching and rash.  Neurological:  Negative for dizziness, tingling, weakness and headaches.  Endo/Heme/Allergies:  Negative for environmental allergies. Does not bruise/bleed easily.  Psychiatric/Behavioral:  Negative for depression. The patient is not nervous/anxious and does not have insomnia.   All other systems reviewed and are negative.    Objective:  Physical Exam Vitals reviewed.  Constitutional:      General: He is not in acute distress.    Appearance: He is well-developed.     Comments: Thin elderly male  HENT:     Head: Normocephalic and atraumatic.  Eyes:     General: No scleral icterus.    Conjunctiva/sclera: Conjunctivae normal.     Pupils: Pupils are equal, round, and reactive to light.  Neck:     Vascular: No JVD.     Trachea: No tracheal deviation.  Cardiovascular:     Rate and Rhythm: Normal rate and regular rhythm.     Heart sounds: Normal heart sounds. No murmur heard. Pulmonary:     Effort: Pulmonary effort is normal. No tachypnea, accessory muscle usage or respiratory distress.     Breath sounds: No stridor. No wheezing, rhonchi or rales.  Abdominal:     General: There is no distension.     Palpations: Abdomen is soft.     Tenderness:  There is no abdominal tenderness.  Musculoskeletal:        General: No tenderness.     Cervical back: Neck supple.  Lymphadenopathy:     Cervical: No cervical adenopathy.  Skin:    General: Skin is warm and dry.     Capillary Refill: Capillary refill takes less than 2 seconds.     Findings: No rash.  Neurological:  Mental Status: He is alert and oriented to person, place, and time.     Comments: Tremor  Psychiatric:        Behavior: Behavior normal.      Vitals:   06/24/23 1425  BP: 110/74  Pulse: (!) 56  Temp: (!) 97.4 F (36.3 C)  TempSrc: Temporal  SpO2: 98%  Weight: 150 lb 12.8 oz (68.4 kg)  Height: 5\' 4"  (1.626 m)   98% on RA BMI Readings from Last 3 Encounters:  06/24/23 25.88 kg/m  06/16/23 24.56 kg/m  05/20/23 25.26 kg/m   Wt Readings from Last 3 Encounters:  06/24/23 150 lb 12.8 oz (68.4 kg)  06/16/23 147 lb 9.6 oz (67 kg)  05/20/23 151 lb 12.8 oz (68.9 kg)     CBC    Component Value Date/Time   WBC 6.1 05/20/2023 1503   WBC 5.6 05/08/2023 1743   RBC 4.22 05/20/2023 1503   HGB 12.3 (L) 05/20/2023 1503   HGB 13.0 12/04/2021 1359   HCT 39.1 05/20/2023 1503   HCT 37.7 12/04/2021 1359   PLT 199 05/20/2023 1503   PLT 317 01/11/2019 1025   MCV 92.7 05/20/2023 1503   MCV 92 01/11/2019 1025   MCH 29.1 05/20/2023 1503   MCHC 31.5 05/20/2023 1503   RDW 14.1 05/20/2023 1503   RDW 12.7 01/11/2019 1025   LYMPHSABS 0.7 05/20/2023 1503   LYMPHSABS 1.7 01/11/2019 1025   MONOABS 0.1 05/20/2023 1503   EOSABS 0.0 05/20/2023 1503   EOSABS 0.1 01/11/2019 1025   BASOSABS 0.0 05/20/2023 1503   BASOSABS 0.1 01/11/2019 1025     Chest Imaging: Nuclear medicine pet imaging: Hypermetabolic lesion within the right lung other small scattered pulmonary nodules. The patient's images have been independently reviewed by me.    Pulmonary Functions Testing Results:     No data to display          FeNO:   Pathology:   Echocardiogram:   Heart  Catheterization:     Assessment & Plan:     ICD-10-CM   1. Lung nodules  R91.8 Ambulatory referral to Pulmonology    Procedural/ Surgical Case Request: ROBOTIC ASSISTED NAVIGATIONAL BRONCHOSCOPY    CT Super D Chest Wo Contrast    2. Malignant neoplasm of head of pancreas (HCC)  C25.0     3. Former smoker  Z87.891       Discussion:  This is a 82 year old gentleman, history of atrial fibrillation on Eliquis, Parkinson's disease, history of pancreatic cancer status post chemo and radiation now with nodules in the chest hypermetabolic on pet imaging concerning for metastatic disease.  Plan: Today in the office we talked out the risk benefits alternative consideration of bronchoscopy biopsy versus transcutaneous needle aspiration of the pulmonary nodules in the chest. We are agreeable to proceed with robotic assisted navigational bronchoscopy. We talked about the risk of bleeding and pneumothorax. Tentative bronchoscopy date will be on 07/05/2023. Will need 1 week follow-up in clinic after bronc complete.   Current Outpatient Medications:    acetaminophen (TYLENOL) 500 MG tablet, Take 1 tablet (500 mg total) by mouth every 6 (six) hours as needed for moderate pain or mild pain., Disp: 30 tablet, Rfl: 0   bisacodyl (DULCOLAX) 10 MG suppository, Place 1 suppository (10 mg total) rectally daily as needed for moderate constipation., Disp: 12 suppository, Rfl: 0   carbidopa-levodopa (SINEMET CR) 50-200 MG tablet, TAKE 1 TABLET AT BEDTIME, Disp: 90 tablet, Rfl: 0   carbidopa-levodopa (SINEMET IR) 25-100 MG  tablet, Take 3 tablets at 7 AM, 2 tablets at 11 AM, 3 tablets at 3 PM, 2 tablets at 7 PM, Disp: 900 tablet, Rfl: 0   clonazePAM (KLONOPIN) 0.5 MG tablet, Take 1 tablet (0.5 mg total) by mouth at bedtime., Disp: 5 tablet, Rfl: 0   cyanocobalamin (VITAMIN B12) 1000 MCG tablet, Take 1,000 mcg by mouth daily., Disp: , Rfl:    diltiazem (CARDIZEM) 30 MG tablet, Take 1 tablet (30 mg total) by  mouth 2 (two) times daily. Do not take if systolic blood pressure (top number) is less than 100, or if pulse if less than 55., Disp: 60 tablet, Rfl: 3   ELIQUIS 5 MG TABS tablet, TAKE 1 TABLET TWICE A DAY, Disp: 120 tablet, Rfl: 5   Glycerin-Hypromellose-PEG 400 (VISINE DRY EYE OP), Place 1 drop into both eyes 2 (two) times daily as needed (eye irritation). , Disp: , Rfl:    ketoconazole (NIZORAL) 2 % shampoo, Apply 1 application  topically 3 (three) times a week., Disp: , Rfl:    leuprolide, 6 Month, (ELIGARD) 45 MG injection, Inject 45 mg into the skin every 6 (six) months., Disp: , Rfl:    omeprazole (PRILOSEC) 20 MG capsule, Take 1 capsule (20 mg total) by mouth daily., Disp: 90 capsule, Rfl: 3   ondansetron (ZOFRAN) 4 MG tablet, Take 1 tablet (4 mg total) by mouth every 6 (six) hours as needed for nausea., Disp: 20 tablet, Rfl: 0   oxyCODONE (OXY IR/ROXICODONE) 5 MG immediate release tablet, Take 1 tablet (5 mg total) by mouth every 6 (six) hours as needed for severe pain., Disp: 30 tablet, Rfl: 0   PANCREAZE 37000-97300 units CPEP, TAKE 2 CAPSULES WITH BREAKFAST, LUNCH, AND EVENING MEAL (WITH EACH MEAL) AND 1 CAPSULE WITH SNACK, Disp: 600 capsule, Rfl: 5   polyethylene glycol powder (MIRALAX) 17 GM/SCOOP powder, Take 17 g by mouth in the morning and at bedtime., Disp: , Rfl:    rosuvastatin (CRESTOR) 10 MG tablet, TAKE 1 TABLET DAILY, Disp: 90 tablet, Rfl: 3   tiZANidine (ZANAFLEX) 2 MG tablet, Take 1 tablet (2 mg total) by mouth every 8 (eight) hours as needed for muscle spasms., Disp: 10 tablet, Rfl: 0   triamcinolone cream (KENALOG) 0.1 %, Apply 1 application topically 2 (two) times daily. (Patient taking differently: Apply 1 application  topically 2 (two) times daily as needed (itching).), Disp: 453.6 g, Rfl: 1   zoledronic acid (RECLAST) 5 MG/100ML SOLN injection, Inject 5 mg into the vein See admin instructions. Once a year, Disp: , Rfl:    Josephine Igo, DO [facility] 06/24/2023 3:15 PM

## 2023-06-24 NOTE — Progress Notes (Signed)
Synopsis: Referred in October 2024 for multiple pulmonary nodules by Malachy Mood, MD  Subjective:   PATIENT ID: [name] [name] GENDER: male DOB: [birth year], MRN: [id number]  Chief Complaint  Patient presents with   Consult    Review PET scan.  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  Patient followed by Dr. Mosetta Putt from medical oncology.  Patient also has atrial flutter fibrillation on Eliquis.  Presents today with abnormal CT imaging and abnormal pet imaging.  Patient found to have hypermetabolic lesion within the right lung concerning for metastasis also smaller lesions that are within the bilateral lower lobes of the lung concerning for metastasis.  Patient was referred here for consideration of robotic assisted navigational bronchoscopy and tissue sampling.  Patient has been discussed with radiation oncology as well as interventional radiology.  Decision was made for bronchoscopy and biopsy.  He is accompanied today by his 2 sisters.    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)    Prostate cancer metastatic to bone (HCC)    REM sleep behavior disorder    Sleep apnea    BiPap     Family History  Problem Relation Age of Onset   Heart disease Maternal Grandfather    Diabetes Paternal Grandfather    Breast cancer Daughter      Past Surgical History:  Procedure Laterality Date   ANTERIOR APPROACH HEMI HIP ARTHROPLASTY Right 02/05/2023   Procedure: ANTERIOR APPROACH HEMI HIP ARTHROPLASTY;  Surgeon: Jodi Geralds, MD;  Location: [facility] OR;  Service: Orthopedics;  Laterality: Right;    APPENDECTOMY  1963   BIOPSY  12/25/2021   Procedure: BIOPSY;  Surgeon: Lemar Lofty., MD;  Location: [facility] ENDOSCOPY;  Service: Gastroenterology;;   COLONOSCOPY  2010, 2015   ESOPHAGOGASTRODUODENOSCOPY N/A 04/16/2022   Procedure: ESOPHAGOGASTRODUODENOSCOPY (EGD);  Surgeon: Lemar Lofty., MD;  Location: [facility] ENDOSCOPY;  Service: Gastroenterology;  Laterality: N/A;   ESOPHAGOGASTRODUODENOSCOPY (EGD) WITH PROPOFOL N/A 12/25/2021   Procedure: ESOPHAGOGASTRODUODENOSCOPY (EGD) WITH PROPOFOL;  Surgeon: Meridee Score Netty Starring., MD;  Location: [facility] ENDOSCOPY;  Service: Gastroenterology;  Laterality: N/A;   EUS N/A 12/25/2021   Procedure: UPPER ENDOSCOPIC ULTRASOUND (EUS) RADIAL;  Surgeon: Lemar Lofty., MD;  Location: [facility] ENDOSCOPY;  Service: Gastroenterology;  Laterality: N/A;   EUS N/A 04/16/2022   Procedure: UPPER ENDOSCOPIC ULTRASOUND (EUS) RADIAL;  Surgeon: Lemar Lofty., MD;  Location: [facility] ENDOSCOPY;  Service: Gastroenterology;  Laterality: N/A;   FIDUCIAL MARKER PLACEMENT N/A 04/16/2022   Procedure: FIDUCIAL MARKER PLACEMENT;  Surgeon: Lemar Lofty., MD;  Location: [facility] ENDOSCOPY;  Service: Gastroenterology;  Laterality: N/A;   FINE NEEDLE ASPIRATION  12/25/2021   Procedure: FINE NEEDLE ASPIRATION (FNA) LINEAR;  Surgeon: Lemar Lofty., MD;  Location: [facility] ENDOSCOPY;  Service: Gastroenterology;;   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   KYPHOPLASTY N/A 08/15/2020   Procedure: L2 compression fracture;  Surgeon: Kennedy Bucker, MD;  Location: [facility] ORS;  Service: Orthopedics;  Laterality: N/A;   KYPHOPLASTY N/A 08/29/2020   Procedure: T8 KYPHOPLASTY;  Surgeon: Kennedy Bucker, MD;  Location: [facility] ORS;  Service: Orthopedics;  Laterality: N/A;  KYPHOPLASTY N/A 11/12/2020   Procedure: L1 KYPHOPLASTY;  Surgeon: Kennedy Bucker, MD;  Location: [facility] ORS;  Service: Orthopedics;  Laterality: N/A;   PORTACATH PLACEMENT N/A 02/05/2022   Procedure: INSERTION PORT-A-CATH;  Surgeon: Fritzi Mandes, MD;  Location: [facility] ORS;  Service: General;  Laterality: N/A;    Social History   Socioeconomic History   Marital status: Widowed    Spouse name: Genette   Number of children: 5   Years of education: Not on file   Highest education level: Not on file  Occupational History   Occupation: retired    Comment: Art therapist  Tobacco Use   Smoking status: Former    Types: Cigars    Quit date: 09/07/2010    Years since quitting: 12.8   Smokeless tobacco: Never  Vaping Use   Vaping status: Never Used  Substance and Sexual Activity   Alcohol use: Not Currently    Comment: 1 can beer/day. occas.   Drug use: Never   Sexual activity: Not Currently  Other Topics Concern   Not on file  Social History Narrative   [place] in [place]; quit smoking in 2012; ocassional alcohol; Oncologist; wife; 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    Right handed   Two story home   Social Determinants of Health   Financial Resource Strain: Not on file  Food Insecurity: Not on file  Transportation Needs: Not on file  Physical Activity: Not on file  Stress: Not on file  Social Connections: Not on file  Intimate Partner Violence: Not on file     Allergies  Allergen Reactions   Influenza Vaccines Anaphylaxis    Flu shot: 1974 anaphylaxis. 2nd time swollen arm   Influenza Virus Vaccine Other (See Comments)     Outpatient Medications Prior to Visit  Medication Sig Dispense Refill   acetaminophen (TYLENOL) 500 MG tablet Take 1 tablet (500 mg total) by mouth every 6 (six) hours as needed for moderate pain or mild pain. 30 tablet 0   bisacodyl (DULCOLAX) 10 MG suppository Place 1 suppository (10 mg total) rectally daily as needed for moderate constipation. 12 suppository 0   carbidopa-levodopa (SINEMET CR) 50-200 MG tablet TAKE 1 TABLET AT BEDTIME 90 tablet 0   carbidopa-levodopa (SINEMET IR) 25-100 MG tablet Take 3 tablets at 7 AM, 2 tablets at 11 AM, 3 tablets at 3 PM, 2 tablets at 7 PM 900  tablet 0   clonazePAM (KLONOPIN) 0.5 MG tablet Take 1 tablet (0.5 mg total) by mouth at bedtime. 5 tablet 0   cyanocobalamin (VITAMIN B12) 1000 MCG tablet Take 1,000 mcg by mouth daily.     diltiazem (CARDIZEM) 30 MG tablet Take 1 tablet (30 mg total) by mouth 2 (two) times daily. Do not take if systolic blood pressure (top number) is less than 100, or if pulse if less than 55. 60 tablet 3   ELIQUIS 5 MG TABS tablet TAKE 1 TABLET TWICE A DAY 120 tablet 5   Glycerin-Hypromellose-PEG 400 (VISINE DRY EYE OP) Place 1 drop into both eyes 2 (two) times daily as needed (eye irritation).      ketoconazole (NIZORAL) 2 % shampoo Apply 1 application  topically 3 (three) times a week.     leuprolide, 6 Month, (ELIGARD) 45 MG injection Inject 45 mg into the skin every 6 (six) months.     omeprazole (PRILOSEC) 20 MG capsule Take 1 capsule (20 mg total) by mouth daily. 90 capsule 3   ondansetron (ZOFRAN) 4 MG tablet  Take 1 tablet (4 mg total) by mouth every 6 (six) hours as needed for nausea. 20 tablet 0   oxyCODONE (OXY IR/ROXICODONE) 5 MG immediate release tablet Take 1 tablet (5 mg total) by mouth every 6 (six) hours as needed for severe pain. 30 tablet 0   PANCREAZE 37000-97300 units CPEP TAKE 2 CAPSULES WITH BREAKFAST, LUNCH, AND EVENING MEAL (WITH EACH MEAL) AND 1 CAPSULE WITH SNACK 600 capsule 5   polyethylene glycol powder (MIRALAX) 17 GM/SCOOP powder Take 17 g by mouth in the morning and at bedtime.     rosuvastatin (CRESTOR) 10 MG tablet TAKE 1 TABLET DAILY 90 tablet 3   tiZANidine (ZANAFLEX) 2 MG tablet Take 1 tablet (2 mg total) by mouth every 8 (eight) hours as needed for muscle spasms. 10 tablet 0   triamcinolone cream (KENALOG) 0.1 % Apply 1 application topically 2 (two) times daily. (Patient taking differently: Apply 1 application  topically 2 (two) times daily as needed (itching).) 453.6 g 1   zoledronic acid (RECLAST) 5 MG/100ML SOLN injection Inject 5 mg into the vein See admin instructions. Once  a year     No facility-administered medications prior to visit.    Review of Systems  Constitutional:  Negative for chills, fever, malaise/fatigue and weight loss.  HENT:  Negative for hearing loss, sore throat and tinnitus.   Eyes:  Negative for blurred vision and double vision.  Respiratory:  Negative for cough, hemoptysis, sputum production, shortness of breath, wheezing and stridor.   Cardiovascular:  Negative for chest pain, palpitations, orthopnea, leg swelling and PND.  Gastrointestinal:  Negative for abdominal pain, constipation, diarrhea, heartburn, nausea and vomiting.  Genitourinary:  Negative for dysuria, hematuria and urgency.  Musculoskeletal:  Negative for joint pain and myalgias.  Skin:  Negative for itching and rash.  Neurological:  Negative for dizziness, tingling, weakness and headaches.  Endo/Heme/Allergies:  Negative for environmental allergies. Does not bruise/bleed easily.  Psychiatric/Behavioral:  Negative for depression. The patient is not nervous/anxious and does not have insomnia.   All other systems reviewed and are negative.    Objective:  Physical Exam Vitals reviewed.  Constitutional:      General: He is not in acute distress.    Appearance: He is well-developed.     Comments: Thin elderly male  HENT:     Head: Normocephalic and atraumatic.  Eyes:     General: No scleral icterus.    Conjunctiva/sclera: Conjunctivae normal.     Pupils: Pupils are equal, round, and reactive to light.  Neck:     Vascular: No JVD.     Trachea: No tracheal deviation.  Cardiovascular:     Rate and Rhythm: Normal rate and regular rhythm.     Heart sounds: Normal heart sounds. No murmur heard. Pulmonary:     Effort: Pulmonary effort is normal. No tachypnea, accessory muscle usage or respiratory distress.     Breath sounds: No stridor. No wheezing, rhonchi or rales.  Abdominal:     General: There is no distension.     Palpations: Abdomen is soft.     Tenderness:  There is no abdominal tenderness.  Musculoskeletal:        General: No tenderness.     Cervical back: Neck supple.  Lymphadenopathy:     Cervical: No cervical adenopathy.  Skin:    General: Skin is warm and dry.     Capillary Refill: Capillary refill takes less than 2 seconds.     Findings: No rash.  Neurological:  Mental Status: He is alert and oriented to person, place, and time.     Comments: Tremor  Psychiatric:        Behavior: Behavior normal.      Vitals:   06/24/23 1425  BP: 110/74  Pulse: (!) 56  Temp: (!) 97.4 F (36.3 C)  TempSrc: Temporal  SpO2: 98%  Weight: 150 lb 12.8 oz (68.4 kg)  Height: 5\' 4"  (1.626 m)   98% on RA BMI Readings from Last 3 Encounters:  06/24/23 25.88 kg/m  06/16/23 24.56 kg/m  05/20/23 25.26 kg/m   Wt Readings from Last 3 Encounters:  06/24/23 150 lb 12.8 oz (68.4 kg)  06/16/23 147 lb 9.6 oz (67 kg)  05/20/23 151 lb 12.8 oz (68.9 kg)     CBC    Component Value Date/Time   WBC 6.1 05/20/2023 1503   WBC 5.6 05/08/2023 1743   RBC 4.22 05/20/2023 1503   HGB 12.3 (L) 05/20/2023 1503   HGB 13.0 12/04/2021 1359   HCT 39.1 05/20/2023 1503   HCT 37.7 12/04/2021 1359   PLT 199 05/20/2023 1503   PLT 317 01/11/2019 1025   MCV 92.7 05/20/2023 1503   MCV 92 01/11/2019 1025   MCH 29.1 05/20/2023 1503   MCHC 31.5 05/20/2023 1503   RDW 14.1 05/20/2023 1503   RDW 12.7 01/11/2019 1025   LYMPHSABS 0.7 05/20/2023 1503   LYMPHSABS 1.7 01/11/2019 1025   MONOABS 0.1 05/20/2023 1503   EOSABS 0.0 05/20/2023 1503   EOSABS 0.1 01/11/2019 1025   BASOSABS 0.0 05/20/2023 1503   BASOSABS 0.1 01/11/2019 1025     Chest Imaging: Nuclear medicine pet imaging: Hypermetabolic lesion within the right lung other small scattered pulmonary nodules. The patient's images have been independently reviewed by me.    Pulmonary Functions Testing Results:     No data to display          FeNO:   Pathology:   Echocardiogram:   Heart  Catheterization:     Assessment & Plan:     ICD-10-CM   1. Lung nodules  R91.8 Ambulatory referral to Pulmonology    Procedural/ Surgical Case Request: ROBOTIC ASSISTED NAVIGATIONAL BRONCHOSCOPY    CT Super D Chest Wo Contrast    2. Malignant neoplasm of head of pancreas (HCC)  C25.0     3. Former smoker  Z87.891       Discussion:  This is a 82 year old gentleman, history of atrial fibrillation on Eliquis, Parkinson's disease, history of pancreatic cancer status post chemo and radiation now with nodules in the chest hypermetabolic on pet imaging concerning for metastatic disease.  Plan: Today in the office we talked out the risk benefits alternative consideration of bronchoscopy biopsy versus transcutaneous needle aspiration of the pulmonary nodules in the chest. We are agreeable to proceed with robotic assisted navigational bronchoscopy. We talked about the risk of bleeding and pneumothorax. Tentative bronchoscopy date will be on 07/05/2023. Will need 1 week follow-up in clinic after bronc complete.   Current Outpatient Medications:    acetaminophen (TYLENOL) 500 MG tablet, Take 1 tablet (500 mg total) by mouth every 6 (six) hours as needed for moderate pain or mild pain., Disp: 30 tablet, Rfl: 0   bisacodyl (DULCOLAX) 10 MG suppository, Place 1 suppository (10 mg total) rectally daily as needed for moderate constipation., Disp: 12 suppository, Rfl: 0   carbidopa-levodopa (SINEMET CR) 50-200 MG tablet, TAKE 1 TABLET AT BEDTIME, Disp: 90 tablet, Rfl: 0   carbidopa-levodopa (SINEMET IR) 25-100 MG  tablet, Take 3 tablets at 7 AM, 2 tablets at 11 AM, 3 tablets at 3 PM, 2 tablets at 7 PM, Disp: 900 tablet, Rfl: 0   clonazePAM (KLONOPIN) 0.5 MG tablet, Take 1 tablet (0.5 mg total) by mouth at bedtime., Disp: 5 tablet, Rfl: 0   cyanocobalamin (VITAMIN B12) 1000 MCG tablet, Take 1,000 mcg by mouth daily., Disp: , Rfl:    diltiazem (CARDIZEM) 30 MG tablet, Take 1 tablet (30 mg total) by  mouth 2 (two) times daily. Do not take if systolic blood pressure (top number) is less than 100, or if pulse if less than 55., Disp: 60 tablet, Rfl: 3   ELIQUIS 5 MG TABS tablet, TAKE 1 TABLET TWICE A DAY, Disp: 120 tablet, Rfl: 5   Glycerin-Hypromellose-PEG 400 (VISINE DRY EYE OP), Place 1 drop into both eyes 2 (two) times daily as needed (eye irritation). , Disp: , Rfl:    ketoconazole (NIZORAL) 2 % shampoo, Apply 1 application  topically 3 (three) times a week., Disp: , Rfl:    leuprolide, 6 Month, (ELIGARD) 45 MG injection, Inject 45 mg into the skin every 6 (six) months., Disp: , Rfl:    omeprazole (PRILOSEC) 20 MG capsule, Take 1 capsule (20 mg total) by mouth daily., Disp: 90 capsule, Rfl: 3   ondansetron (ZOFRAN) 4 MG tablet, Take 1 tablet (4 mg total) by mouth every 6 (six) hours as needed for nausea., Disp: 20 tablet, Rfl: 0   oxyCODONE (OXY IR/ROXICODONE) 5 MG immediate release tablet, Take 1 tablet (5 mg total) by mouth every 6 (six) hours as needed for severe pain., Disp: 30 tablet, Rfl: 0   PANCREAZE 37000-97300 units CPEP, TAKE 2 CAPSULES WITH BREAKFAST, LUNCH, AND EVENING MEAL (WITH EACH MEAL) AND 1 CAPSULE WITH SNACK, Disp: 600 capsule, Rfl: 5   polyethylene glycol powder (MIRALAX) 17 GM/SCOOP powder, Take 17 g by mouth in the morning and at bedtime., Disp: , Rfl:    rosuvastatin (CRESTOR) 10 MG tablet, TAKE 1 TABLET DAILY, Disp: 90 tablet, Rfl: 3   tiZANidine (ZANAFLEX) 2 MG tablet, Take 1 tablet (2 mg total) by mouth every 8 (eight) hours as needed for muscle spasms., Disp: 10 tablet, Rfl: 0   triamcinolone cream (KENALOG) 0.1 %, Apply 1 application topically 2 (two) times daily. (Patient taking differently: Apply 1 application  topically 2 (two) times daily as needed (itching).), Disp: 453.6 g, Rfl: 1   zoledronic acid (RECLAST) 5 MG/100ML SOLN injection, Inject 5 mg into the vein See admin instructions. Once a year, Disp: , Rfl:    Josephine Igo, DO [facility] 06/24/2023 3:15 PM

## 2023-06-25 DIAGNOSIS — R2689 Other abnormalities of gait and mobility: Secondary | ICD-10-CM | POA: Diagnosis not present

## 2023-06-25 DIAGNOSIS — Z96641 Presence of right artificial hip joint: Secondary | ICD-10-CM | POA: Diagnosis not present

## 2023-06-25 DIAGNOSIS — R2681 Unsteadiness on feet: Secondary | ICD-10-CM | POA: Diagnosis not present

## 2023-06-25 DIAGNOSIS — S72001S Fracture of unspecified part of neck of right femur, sequela: Secondary | ICD-10-CM | POA: Diagnosis not present

## 2023-06-25 DIAGNOSIS — M62551 Muscle wasting and atrophy, not elsewhere classified, right thigh: Secondary | ICD-10-CM | POA: Diagnosis not present

## 2023-06-25 DIAGNOSIS — M25551 Pain in right hip: Secondary | ICD-10-CM | POA: Diagnosis not present

## 2023-06-28 DIAGNOSIS — R2689 Other abnormalities of gait and mobility: Secondary | ICD-10-CM | POA: Diagnosis not present

## 2023-06-28 DIAGNOSIS — Z96641 Presence of right artificial hip joint: Secondary | ICD-10-CM | POA: Diagnosis not present

## 2023-06-28 DIAGNOSIS — R2681 Unsteadiness on feet: Secondary | ICD-10-CM | POA: Diagnosis not present

## 2023-06-28 DIAGNOSIS — S72001S Fracture of unspecified part of neck of right femur, sequela: Secondary | ICD-10-CM | POA: Diagnosis not present

## 2023-06-28 DIAGNOSIS — M62551 Muscle wasting and atrophy, not elsewhere classified, right thigh: Secondary | ICD-10-CM | POA: Diagnosis not present

## 2023-06-28 DIAGNOSIS — M25551 Pain in right hip: Secondary | ICD-10-CM | POA: Diagnosis not present

## 2023-06-29 ENCOUNTER — Encounter: Payer: Self-pay | Admitting: Nurse Practitioner

## 2023-06-29 ENCOUNTER — Ambulatory Visit (INDEPENDENT_AMBULATORY_CARE_PROVIDER_SITE_OTHER): Payer: Medicare Other | Admitting: Nurse Practitioner

## 2023-06-29 VITALS — BP 100/60 | HR 54 | Ht 64.0 in | Wt 146.1 lb

## 2023-06-29 DIAGNOSIS — R935 Abnormal findings on diagnostic imaging of other abdominal regions, including retroperitoneum: Secondary | ICD-10-CM

## 2023-06-29 DIAGNOSIS — C250 Malignant neoplasm of head of pancreas: Secondary | ICD-10-CM | POA: Diagnosis not present

## 2023-06-29 DIAGNOSIS — K5909 Other constipation: Secondary | ICD-10-CM | POA: Diagnosis not present

## 2023-06-29 DIAGNOSIS — K625 Hemorrhage of anus and rectum: Secondary | ICD-10-CM | POA: Diagnosis not present

## 2023-06-29 NOTE — Progress Notes (Signed)
06/29/2023 CALIBER OMANA [id number] 1941/03/23   Chief Complaint: Constipation follow-up  History of Present Illness: Caydan R. Lonn [place] R. Provencher is an 82 year old male with a past medical history of atrial flutter on Eliquis, sleep apnea, Parkinson's disease, prostate cancer with bone metastasis and pancreatic cancer diagnosed 09/2021 s/p chemo with Gemcitabine on 01/13/22 - 04/01/22 and SBRT 8/28 - 05/14/2022 followed by Dr. Malachy Mood who determined patient is not a candidate for pancreatic resection due to age and multiple medical comorbidities. He did not wish to pursue any further chemotherapy.  He is known by Dr. Meridee Score.  He was last seen in office by Doug Sou PA-C 04/22/2023 guarding constipation.  He was prescribed a MiraLAX purge which resulted in cleaning out his bowels and he passed a formed stool daily for the next 2 weeks then had recurrence of constipation.  Since then, he takes MiraLAX 3 times daily and cod liver oil 1 to 2 tablespoons daily.  He infrequently uses a Duca locks suppository.  He typically passes a normal formed brown bowel movement every other day on this regimen.  He sometimes develops generalized abdominal discomfort if he goes 2 days without passing a bowel movement.  Few weeks ago, he passed a really hard stool which required manual removal. He sometimes sees a small amount of bright red blood on the toilet tissue or stool which occurs when his stools are hard.  His appetite is good.  He denies having any nausea or vomiting.  No noticeable weight loss.  He infrequently takes oxycodone for chronic pain but tries not to take it on a regular basis because he knows it will worsen his constipation.  He endorses undergoing 3 colonoscopies in his lifetime he believes a few benign colon polyps removed during each procedure.  His most recent colonoscopy was more than 10 years ago.  No GERD symptoms on Omeprazole 20 mg daily.     Latest Ref Rng & Units 05/20/2023     3:03 PM 05/17/2023   11:06 AM 05/08/2023    5:43 PM  CBC  WBC 4.0 - 10.5 K/uL 6.1  4.8  5.6   Hemoglobin 13.0 - 17.0 g/dL 46.9  62.9  52.8   Hematocrit 39.0 - 52.0 % 39.1  41.2  38.7   Platelets 150 - 400 K/uL 199  213  180        Latest Ref Rng & Units 05/20/2023    3:03 PM 05/17/2023   11:06 AM 05/08/2023    5:43 PM  CMP  Glucose 70 - 99 mg/dL 413  86  244   BUN 8 - 23 mg/dL 25  22  21    Creatinine 0.61 - 1.24 mg/dL 0.10  2.72  5.36   Sodium 135 - 145 mmol/L 138  143  139   Potassium 3.5 - 5.1 mmol/L 4.9  4.3  3.9   Chloride 98 - 111 mmol/L 108  108  108   CO2 22 - 32 mmol/L 26  29  21    Calcium 8.9 - 10.3 mg/dL 9.9  9.8  8.7   Total Protein 6.5 - 8.1 g/dL 7.5  7.1    Total Bilirubin 0.3 - 1.2 mg/dL 0.8  0.7    Alkaline Phos 38 - 126 U/L 69  67    AST 15 - 41 U/L 25  24    ALT 0 - 44 U/L 7  <5       GI PROCEDURES:  EGD/EUS 04/16/2022 by Dr. Meridee Score: EGD Impression: - No gross lesions in esophagus. Z-line irregular, 40 cm from the incisors. - 4 cm hiatal hernia. - No gross lesions in the stomach. - No gross lesions in the duodenal bulb, in the first portion of the duodenum and in the second portion of the duodenum. - Normal ampulla. EUS Impression: - A mass was identified in the pancreatic head. A tissue diagnosis was obtained prior to this exam. This is of adenocarcinoma. Fiducial markers were deployed.   EGD/EUS 12/25/2021:  EGD Impression: - No gross lesions in esophagus. Z-line irregular, 40 cm from the incisors. - 4 cm hiatal hernia. - Gastritis. Biopsied. - No gross lesions in the duodenal bulb, in the first portion of the duodenum and in the second portion of the duodenum. - Normal major papilla. A. STOMACH, BIOPSY:  - Gastric antral mucosa with mild nonspecific reactive gastropathy  - Gastric oxyntic mucosa with no specific histopathologic changes  - Helicobacter pylori-like organisms are not identified on routine HE  stain    EUS Impression - A  mass-like region was identified in the pancreatic head where the pancreatic duct dilates with multiple cystic regions noted throughout the pancreas as well. 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 Cytology results are pending. However, the endosonographic appearance is suspicious for potential adenocarcinoma. This was staged T2 N0 Mx by endosonographic criteria. The staging applies if malignancy is confirmed. Fine needle biopsy performed. - No malignant-appearing lymph nodes were visualized in the celiac region (level 20), peripancreatic region and porta hepatis region. A. PANCREAS, HEAD, FINE NEEDLE ASPIRATION:  - Malignant cells consistent with adenocarcinoma    Current Outpatient Medications on File Prior to Visit  Medication Sig Dispense Refill   acetaminophen (TYLENOL) 500 MG tablet Take 1 tablet (500 mg total) by mouth every 6 (six) hours as needed for moderate pain or mild pain. 30 tablet 0   ammonium lactate (LAC-HYDRIN) 12 % lotion Apply 1 Application topically as needed.     bisacodyl (DULCOLAX) 10 MG suppository Place 1 suppository (10 mg total) rectally daily as needed for moderate constipation. 12 suppository 0   carbidopa-levodopa (SINEMET CR) 50-200 MG tablet TAKE 1 TABLET AT BEDTIME 90 tablet 0   carbidopa-levodopa (SINEMET IR) 25-100 MG tablet Take 3 tablets at 7 AM, 2 tablets at 11 AM, 3 tablets at 3 PM, 2 tablets at 7 PM 900 tablet 0   ciclopirox (PENLAC) 8 % solution Apply 1 Application topically at bedtime.     clonazePAM (KLONOPIN) 0.5 MG tablet Take 1 tablet (0.5 mg total) by mouth at bedtime. 5 tablet 0   cyanocobalamin (VITAMIN B12) 1000 MCG tablet Take 1,000 mcg by mouth daily.     diltiazem (CARDIZEM) 30 MG tablet Take 1 tablet (30 mg total) by mouth 2 (two) times daily. Do not take if systolic blood pressure (top number) is less than 100, or if pulse if less than 55. 60 tablet 3   ELIQUIS 5 MG TABS tablet TAKE 1 TABLET TWICE A DAY  120 tablet 5   Glycerin-Hypromellose-PEG 400 (VISINE DRY EYE OP) Place 1 drop into both eyes 2 (two) times daily as needed (eye irritation).      ketoconazole (NIZORAL) 2 % shampoo Apply 1 application  topically 3 (three) times a week.     leuprolide, 6 Month, (ELIGARD) 45 MG injection Inject 45 mg into the skin every 6 (six) months.     omeprazole (PRILOSEC) 20 MG capsule Take  1 capsule (20 mg total) by mouth daily. 90 capsule 3   ondansetron (ZOFRAN) 4 MG tablet Take 1 tablet (4 mg total) by mouth every 6 (six) hours as needed for nausea. 20 tablet 0   oxyCODONE (OXY IR/ROXICODONE) 5 MG immediate release tablet Take 1 tablet (5 mg total) by mouth every 6 (six) hours as needed for severe pain. 30 tablet 0   PANCREAZE 37000-97300 units CPEP TAKE 2 CAPSULES WITH BREAKFAST, LUNCH, AND EVENING MEAL (WITH EACH MEAL) AND 1 CAPSULE WITH SNACK 600 capsule 5   polyethylene glycol powder (MIRALAX) 17 GM/SCOOP powder Take 17 g by mouth in the morning and at bedtime.     rosuvastatin (CRESTOR) 10 MG tablet TAKE 1 TABLET DAILY 90 tablet 3   tiZANidine (ZANAFLEX) 2 MG tablet Take 1 tablet (2 mg total) by mouth every 8 (eight) hours as needed for muscle spasms. 10 tablet 0   triamcinolone cream (KENALOG) 0.1 % Apply 1 application topically 2 (two) times daily. (Patient taking differently: Apply 1 application  topically 2 (two) times daily as needed (itching).) 453.6 g 1   zoledronic acid (RECLAST) 5 MG/100ML SOLN injection Inject 5 mg into the vein See admin instructions. Once a year     No current facility-administered medications on file prior to visit.   Allergies  Allergen Reactions   Influenza Vaccines Anaphylaxis    Flu shot: 1974 anaphylaxis. 2nd time swollen arm   Influenza Virus Vaccine Other (See Comments)   Current Medications, Allergies, Past Medical History, Past Surgical History, Family History and Social History were reviewed in [employer] record.  Review of Systems:    Constitutional: Negative for fever, sweats, chills or weight loss.  Respiratory: Negative for shortness of breath.   Cardiovascular: Negative for chest pain, palpitations and leg swelling.  Gastrointestinal: See HPI.  Musculoskeletal: Negative for back pain or muscle aches.  Neurological: Negative for dizziness, headaches or paresthesias.   Physical Exam: BP 100/60 (BP Location: Left Arm, Patient Position: Sitting, Cuff Size: Normal)   Pulse (!) 54   Ht 5\' 4"  (1.626 m)   Wt 146 lb 2 oz (66.3 kg)   SpO2 95%   BMI 25.08 kg/m  Wt Readings from Last 3 Encounters:  06/29/23 146 lb 2 oz (66.3 kg)  06/24/23 150 lb 12.8 oz (68.4 kg)  06/16/23 147 lb 9.6 oz (67 kg)    General: 82 year old male chronically ill-appearing in no acute distress. Head: Normocephalic and atraumatic. Eyes: No scleral icterus. Conjunctiva pink . Ears: Normal auditory acuity. Mouth: Dentition intact. No ulcers or lesions.  Lungs: Clear throughout to auscultation. Heart: Regular rate and rhythm, no murmur. Abdomen: Soft, nontender.  Mild gaseous distention. No masses or hepatomegaly. Normal bowel sounds x 4 quadrants.  Rectal: Deferred. Musculoskeletal: Symmetrical with no gross deformities. Extremities: No edema. Neurological: Alert oriented x 4. No focal deficits.  Psychological: Alert and cooperative. 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 Mosetta Putt. CTAP 08/12/2022 showed a light interval increase in the size of his pancreatic head mass without new metastasis. Further chemotherapy was previously discussed, patient declined.  Is not a surgical candidate. -Continue follow up with Dr. Mosetta Putt     Chronic constipation, Parkinson's disease and oxycodone likely contributing factors -Miralax 2-3 times daily as tolerated -Dulcolax suppository 1 PR weekly as  needed -No recommendations  regarding cod liver oil -Consider trial with Linzess if constipation persists/worsens on the above regimen  Rectal bleeding. Bright red blood on the toilet tissue and stool which occurs when patient passes a hard stool or strains -Treat constipation as noted above -No recommendations for diagnostic colonoscopy    History of prostate cancer with bone metastasis   Paroxysmal atrial flutter on Eliquis    Parkinson's disease

## 2023-06-29 NOTE — Patient Instructions (Addendum)
_______________________________________________________  If your blood pressure at your visit was 140/90 or greater, please contact your primary care physician to follow up on this.  _______________________________________________________  If you are age 82 or older, your body mass index should be between 23-30. Your Body mass index is 25.08 kg/m. If this is out of the aforementioned range listed, please consider follow up with your Primary Care Provider.  If you are age 108 or younger, your body mass index should be between 19-25. Your Body mass index is 25.08 kg/m. If this is out of the aformentioned range listed, please consider follow up with your Primary Care Provider.   ________________________________________________________  The [facility] providers would like to encourage you to use [facility] to communicate with providers for non-urgent requests or questions.  Due to long hold times on the telephone, sending your provider a message by [facility] may be a faster and more efficient way to get a response.  Please allow 48 business hours for a response.  Please remember that this is for non-urgent requests.  _______________________________________________________  Please purchase the following medications over the counter and take as directed:  Take Miralax 2 to 3 times daily  Use Dulcolax suppositories per rectum at bedtime once weekly as needed.  You will follow up in our office on an as needed basis.  Thank you for entrusting me with your care and choosing [facility].  Alcide Evener, NP

## 2023-06-30 DIAGNOSIS — Z96641 Presence of right artificial hip joint: Secondary | ICD-10-CM | POA: Diagnosis not present

## 2023-06-30 DIAGNOSIS — S72001S Fracture of unspecified part of neck of right femur, sequela: Secondary | ICD-10-CM | POA: Diagnosis not present

## 2023-06-30 DIAGNOSIS — R2689 Other abnormalities of gait and mobility: Secondary | ICD-10-CM | POA: Diagnosis not present

## 2023-06-30 DIAGNOSIS — M62551 Muscle wasting and atrophy, not elsewhere classified, right thigh: Secondary | ICD-10-CM | POA: Diagnosis not present

## 2023-06-30 DIAGNOSIS — R2681 Unsteadiness on feet: Secondary | ICD-10-CM | POA: Diagnosis not present

## 2023-06-30 DIAGNOSIS — M25551 Pain in right hip: Secondary | ICD-10-CM | POA: Diagnosis not present

## 2023-07-01 ENCOUNTER — Ambulatory Visit (HOSPITAL_COMMUNITY)
Admission: RE | Admit: 2023-07-01 | Discharge: 2023-07-01 | Disposition: A | Discharge status: Home / Self Care | Payer: Medicare Other | Source: Ambulatory Visit | Attending: Pulmonary Disease | Admitting: Pulmonary Disease

## 2023-07-01 DIAGNOSIS — R918 Other nonspecific abnormal finding of lung field: Secondary | ICD-10-CM | POA: Insufficient documentation

## 2023-07-01 DIAGNOSIS — C259 Malignant neoplasm of pancreas, unspecified: Secondary | ICD-10-CM | POA: Diagnosis not present

## 2023-07-01 NOTE — Progress Notes (Signed)
Attending Physician's Attestation   I have reviewed the chart.   I agree with the Advanced Practitioner's note, impression, and recommendations with any updates as below. If issues persist then consider flexible sigmoidoscopy for evaluation of the bleeding, though in setting of his underlying pancreatic cancer, seems that it would unlikely lead to significant changes.  If ID or IDA is found in future, would plan for endoscopic evaluation consideration.   Corliss Parish, MD [facility] Advanced Endoscopy Office # [phone]

## 2023-07-02 ENCOUNTER — Other Ambulatory Visit: Payer: Self-pay

## 2023-07-02 ENCOUNTER — Encounter (HOSPITAL_COMMUNITY): Payer: Self-pay | Admitting: Pulmonary Disease

## 2023-07-02 NOTE — Anesthesia Preprocedure Evaluation (Signed)
Anesthesia Evaluation  Patient identified by MRN, date of birth, ID band Patient awake    Reviewed: Allergy & Precautions, NPO status , Patient's Chart, lab work & pertinent test results, reviewed documented beta blocker date and time   Airway Mallampati: I  TM Distance: >3 FB Neck ROM: Full    Dental no notable dental hx. (+) Teeth Intact, Dental Advisory Given   Pulmonary sleep apnea and Continuous Positive Airway Pressure Ventilation , former smoker Pulmonary nodules   Pulmonary exam normal breath sounds clear to auscultation       Cardiovascular hypertension, Pt. on medications + dysrhythmias Atrial Fibrillation  Rhythm:Regular Rate:Bradycardia  Echo 08/17/19 1. Left ventricular ejection fraction, by visual estimation, is 50 to  55%. The left ventricle has low normal function. Left ventricular septal  wall thickness was moderately increased. Moderately increased left  ventricular posterior wall thickness. There   is moderately increased left ventricular hypertrophy.   2. Left ventricular diastolic parameters are consistent with Grade I  diastolic dysfunction (impaired relaxation).   3. The left ventricle has no regional wall motion abnormalities.   4. Global right ventricle has normal systolic function.The right  ventricular size is normal. No increase in right ventricular wall  thickness.   5. Left atrial size was normal.   6. Right atrial size was mildly dilated.   7. The mitral valve was not well visualized. Trivial mitral valve  regurgitation.   8. The tricuspid valve is not well visualized. Tricuspid valve  regurgitation is trivial.   9. The aortic valve was not well visualized. Aortic valve regurgitation  is trivial.  10. The pulmonic valve was not well visualized. Pulmonic valve  regurgitation is trivial.  11. The aortic root was not well visualized.  12. Normal pulmonary artery systolic pressure.  13. The atrial  septum is grossly normal.   EKG 06/16/23 SB, RBBB pattern, 1st deg AV block   Neuro/Psych Parkinson's disease negative neurological ROS  negative psych ROS   GI/Hepatic Neg liver ROS,GERD  ,,Hx/o pancreatic Ca ChemoRx   Endo/Other  Osteoporosis Hyperlipidemia  Renal/GU Kidney donor   Hx/o prostate Ca with metastasis to bone    Musculoskeletal Hx/o vertebral Fx   Abdominal   Peds  Hematology Eliquis therapy- last dose 10/25   Anesthesia Other Findings   Reproductive/Obstetrics                             Anesthesia Physical Anesthesia Plan  ASA: 3  Anesthesia Plan: General   Post-op Pain Management: Minimal or no pain anticipated   Induction: Intravenous  PONV Risk Score and Plan: 3 and Treatment may vary due to age or medical condition, Ondansetron, Propofol infusion and TIVA  Airway Management Planned: Oral ETT  Additional Equipment: None  Intra-op Plan:   Post-operative Plan: Extubation in OR  Informed Consent: I have reviewed the patients History and Physical, chart, labs and discussed the procedure including the risks, benefits and alternatives for the proposed anesthesia with the patient or authorized representative who has indicated his/her understanding and acceptance.     Dental advisory given  Plan Discussed with: Anesthesiologist and CRNA  Anesthesia Plan Comments: (PAT note by Antionette Poles, PA-C:  82 yo male with pertinent hx including Parkinson's, metastatic prostate cancer, pancreatic cancer, OSA on BiPAP, paroxysmal afib/flutter, kidney donor (left, 2016).  Follows with cardiology for hx of afib/flutter. He was seen in the ED 05/09/23 with symptomatic rapid afib. Because he  was appropriately anticoagulated he underwent direct-current cardioversion and successfully restored normal sinus rhythm. His diltiazem was reduced to 60 mg p.o. twice daily with holding parameters. Last seen by Dr. Odis Hollingshead 06/16/23. Per note, he has  been stable since ED visit. He reportedly was holding many doses of diltiazem due to holding parameters to avoid hypotension. He was in sinus rhythm at that visit. His diltiazem was reduced to 30mg  BID with holding parameters. He was continued on Eliquis.   Follows with oncology for hx of prostate cancer dx'd 2020 metastatic to bone and pancreatic cancer dx'd 2023. Deemed poor surgicalcandidate due to his age and medical co-morbidities. S/p single agent gemcitabine 01/13/22 - 04/01/22 and 5 fx SBRT 05/04/22 - 05/14/22. Last seen by Dr. Mosetta Putt 06/10/23 and clinically stable. CT scan from May 17, 2023 showed multiple lung nodules, with dominant 1.5 cm nodule in the right middle lobe, highly concerning for metastasis. Bronchoscopy recommended.  Pt will need DOS labs and eval.  EKG 06/16/23: Sinus bradycardia with 1st degree A-V block. Rate 52. Right bundle branch block  Echocardiogram 04/30/2021:  Normal LV systolic function with EF 63%. Moderate concentric hypertrophy  of the left ventricle. Normal global wall motion. Left ventricle cavity is  normal in size. Doppler evidence of grade II (pseudonormal) diastolic  dysfunction, elevated LAP.  Left atrial cavity is moderately dilated.  Trileaflet aortic valve. Moderate (Grade II) aortic regurgitation.  Mild to moderate mitral regurgitation.  Mild tricuspid regurgitation.  No evidence of pulmonary hypertension.   Lexiscan Tetrofosmin stress test 04/30/2021: 1 Day Rest/Stress Protocol. Stress EKG is non-diagnostic for ischemia as its a pharmacologic stress test using Lexiscan. Normal myocardial perfusion without convincing evidence of reversible myocardial ischemia or prior infarct.   Left ventricular ejection fraction is 55% with normal wall motion.   Low risk study. No prior studies for comparison.   )        Anesthesia Quick Evaluation

## 2023-07-02 NOTE — Progress Notes (Addendum)
PCP - Dr Lorenda Ishihara Cardiologist - Tessa Lerner, DO Neurology - Revecca Tat, DO Oncology - Dr Malachy Mood  CT Chest x-ray - 07/01/23 EKG - 06/16/23 Stress Test - 04/30/21 PCV ECHO - 04/30/21 Cardiac Cath - n/a  ICD Pacemaker/Loop - n/a  Sleep Study -  Yes BiPAP - uses nightly  Diabetes - n/a  Blood Thinner Instructions:  Last dose of Eliquis was on 07/02/23.   NPO  Anesthesia review: Yes  STOP now taking any Aspirin (unless otherwise instructed by your surgeon), Aleve, Naproxen, Ibuprofen, Motrin, Advil, Goody's, BC's, all herbal medications, fish oil, and all vitamins.   Coronavirus Screening Do you have any of the following symptoms:  Cough yes/no: No Fever (>100.10F)  yes/no: No Runny nose yes/no: No Sore throat yes/no: No Difficulty breathing/shortness of breath  yes/no: No  Have you traveled in the last 14 days and where? yes/no: No  Patient verbalized understanding of instructions that were given via phone.

## 2023-07-02 NOTE — Progress Notes (Signed)
Anesthesia Chart Review: Same day workup  82 yo male with pertinent hx including Parkinson's, metastatic prostate cancer, pancreatic cancer, OSA on BiPAP, paroxysmal afib/flutter, kidney donor (left, 2016).  Follows with cardiology for hx of afib/flutter. He was seen in the ED 05/09/23 with symptomatic rapid afib. Because he was appropriately anticoagulated he underwent direct-current cardioversion and successfully restored normal sinus rhythm. His diltiazem was reduced to 60 mg p.o. twice daily with holding parameters. Last seen by Dr. Odis Hollingshead 06/16/23. Per note, he has been stable since ED visit. He reportedly was holding many doses of diltiazem due to holding parameters to avoid hypotension. He was in sinus rhythm at that visit. His diltiazem was reduced to 30mg  BID with holding parameters. He was continued on Eliquis.   Follows with oncology for hx of prostate cancer dx'd 2020 metastatic to bone and pancreatic cancer dx'd 2023. Deemed poor surgical candidate due to his age and medical co-morbidities. S/p single agent gemcitabine 01/13/22 - 04/01/22 and 5 fx SBRT 05/04/22 - 05/14/22. Last seen by Dr. Mosetta Putt 06/10/23 and clinically stable. CT scan from May 17, 2023 showed multiple lung nodules, with dominant 1.5 cm nodule in the right middle lobe, highly concerning for metastasis. Bronchoscopy recommended.  Pt will need DOS labs and eval.  EKG 06/16/23: Sinus bradycardia with 1st degree A-V block. Rate 52. Right bundle branch block  Echocardiogram 04/30/2021:  Normal LV systolic function with EF 63%. Moderate concentric hypertrophy  of the left ventricle. Normal global wall motion. Left ventricle cavity is  normal in size. Doppler evidence of grade II (pseudonormal) diastolic  dysfunction, elevated LAP.  Left atrial cavity is moderately dilated.  Trileaflet aortic valve.  Moderate (Grade II) aortic regurgitation.  Mild to moderate mitral regurgitation.  Mild tricuspid regurgitation.  No evidence of  pulmonary hypertension.   Lexiscan Tetrofosmin stress test 04/30/2021: 1 Day Rest/Stress Protocol. Stress EKG is non-diagnostic for ischemia as its a pharmacologic stress test using Lexiscan. Normal myocardial perfusion without convincing evidence of reversible myocardial ischemia or prior infarct.   Left ventricular ejection fraction is 55% with normal wall motion.   Low risk study. No prior studies for comparison.    [facility] Short Stay Center/Anesthesiology Phone [phone] 07/02/2023 9:44 AM

## 2023-07-05 ENCOUNTER — Other Ambulatory Visit: Payer: Self-pay

## 2023-07-05 ENCOUNTER — Ambulatory Visit (HOSPITAL_COMMUNITY): Payer: Medicare Other

## 2023-07-05 ENCOUNTER — Ambulatory Visit (HOSPITAL_COMMUNITY)
Admission: RE | Admit: 2023-07-05 | Discharge: 2023-07-05 | Disposition: A | Discharge status: Home / Self Care | Payer: Medicare Other | Attending: Pulmonary Disease | Admitting: Pulmonary Disease

## 2023-07-05 ENCOUNTER — Ambulatory Visit (HOSPITAL_BASED_OUTPATIENT_CLINIC_OR_DEPARTMENT_OTHER): Payer: Medicare Other | Admitting: Physician Assistant

## 2023-07-05 ENCOUNTER — Encounter (HOSPITAL_COMMUNITY)
Admission: RE | Disposition: A | Discharge status: Home / Self Care | Payer: Self-pay | Source: Home / Self Care | Attending: Pulmonary Disease

## 2023-07-05 ENCOUNTER — Encounter (HOSPITAL_COMMUNITY): Payer: Self-pay | Admitting: Pulmonary Disease

## 2023-07-05 ENCOUNTER — Ambulatory Visit (HOSPITAL_COMMUNITY): Payer: Medicare Other | Admitting: Physician Assistant

## 2023-07-05 DIAGNOSIS — I10 Essential (primary) hypertension: Secondary | ICD-10-CM | POA: Diagnosis not present

## 2023-07-05 DIAGNOSIS — R0989 Other specified symptoms and signs involving the circulatory and respiratory systems: Secondary | ICD-10-CM | POA: Diagnosis not present

## 2023-07-05 DIAGNOSIS — Z87891 Personal history of nicotine dependence: Secondary | ICD-10-CM | POA: Insufficient documentation

## 2023-07-05 DIAGNOSIS — R918 Other nonspecific abnormal finding of lung field: Secondary | ICD-10-CM | POA: Diagnosis not present

## 2023-07-05 DIAGNOSIS — Z8507 Personal history of malignant neoplasm of pancreas: Secondary | ICD-10-CM | POA: Insufficient documentation

## 2023-07-05 DIAGNOSIS — I4891 Unspecified atrial fibrillation: Secondary | ICD-10-CM | POA: Insufficient documentation

## 2023-07-05 DIAGNOSIS — G20A1 Parkinson's disease without dyskinesia, without mention of fluctuations: Secondary | ICD-10-CM | POA: Diagnosis not present

## 2023-07-05 DIAGNOSIS — Z7901 Long term (current) use of anticoagulants: Secondary | ICD-10-CM | POA: Insufficient documentation

## 2023-07-05 DIAGNOSIS — Z8546 Personal history of malignant neoplasm of prostate: Secondary | ICD-10-CM | POA: Diagnosis not present

## 2023-07-05 DIAGNOSIS — Z48813 Encounter for surgical aftercare following surgery on the respiratory system: Secondary | ICD-10-CM | POA: Diagnosis not present

## 2023-07-05 DIAGNOSIS — I4430 Unspecified atrioventricular block: Secondary | ICD-10-CM | POA: Diagnosis not present

## 2023-07-05 DIAGNOSIS — Z85830 Personal history of malignant neoplasm of bone: Secondary | ICD-10-CM | POA: Diagnosis not present

## 2023-07-05 DIAGNOSIS — I4892 Unspecified atrial flutter: Secondary | ICD-10-CM | POA: Diagnosis not present

## 2023-07-05 DIAGNOSIS — I4510 Unspecified right bundle-branch block: Secondary | ICD-10-CM | POA: Diagnosis not present

## 2023-07-05 DIAGNOSIS — G4730 Sleep apnea, unspecified: Secondary | ICD-10-CM | POA: Insufficient documentation

## 2023-07-05 DIAGNOSIS — R846 Abnormal cytological findings in specimens from respiratory organs and thorax: Secondary | ICD-10-CM | POA: Diagnosis not present

## 2023-07-05 DIAGNOSIS — R911 Solitary pulmonary nodule: Secondary | ICD-10-CM | POA: Diagnosis not present

## 2023-07-05 HISTORY — PX: BRONCHIAL BIOPSY: SHX5109

## 2023-07-05 LAB — CBC
HCT: 41.2 % (ref 39.0–52.0)
Hemoglobin: 12.9 g/dL — ABNORMAL LOW (ref 13.0–17.0)
MCH: 28.7 pg (ref 26.0–34.0)
MCHC: 31.3 g/dL (ref 30.0–36.0)
MCV: 91.6 fL (ref 80.0–100.0)
Platelets: 172 10*3/uL (ref 150–400)
RBC: 4.5 MIL/uL (ref 4.22–5.81)
RDW: 15.1 % (ref 11.5–15.5)
WBC: 4.5 10*3/uL (ref 4.0–10.5)
nRBC: 0 % (ref 0.0–0.2)

## 2023-07-05 LAB — COMPREHENSIVE METABOLIC PANEL
ALT: <5 U/L (ref 0–44)
AST: 33 U/L (ref 15–41)
Albumin: 3.7 g/dL (ref 3.5–5.0)
Alkaline Phosphatase: 52 U/L (ref 38–126)
Anion gap: 10 (ref 5–15)
BUN: 21 mg/dL (ref 8–23)
CO2: 23 mmol/L (ref 22–32)
Calcium: 9.4 mg/dL (ref 8.9–10.3)
Chloride: 106 mmol/L (ref 98–111)
Creatinine, Ser: 0.92 mg/dL (ref 0.61–1.24)
GFR, Estimated: >60 mL/min (ref >60)
Glucose, Bld: 95 mg/dL (ref 70–99)
Potassium: 3.9 mmol/L (ref 3.5–5.1)
Sodium: 139 mmol/L (ref 135–145)
Total Bilirubin: 1.1 mg/dL (ref 0.3–1.2)
Total Protein: 6.9 g/dL (ref 6.5–8.1)

## 2023-07-05 SURGERY — BRONCHOSCOPY, WITH BIOPSY USING ELECTROMAGNETIC NAVIGATION
Anesthesia: General | Laterality: Bilateral

## 2023-07-05 MED ORDER — OXYCODONE HCL 5 MG PO TABS: 5.0000 mg | ORAL_TABLET | Freq: Once | ORAL | Status: DC | PRN

## 2023-07-05 MED ORDER — LACTATED RINGERS IV SOLN: INTRAVENOUS | Status: DC

## 2023-07-05 MED ORDER — LIDOCAINE 2% (20 MG/ML) 5 ML SYRINGE
INTRAMUSCULAR | Status: DC | PRN
  Administered 2023-07-05: 60 mg via INTRAVENOUS

## 2023-07-05 MED ORDER — EPHEDRINE SULFATE-NACL 50-0.9 MG/10ML-% IV SOSY
PREFILLED_SYRINGE | INTRAVENOUS | Status: DC | PRN
  Administered 2023-07-05: 5 mg via INTRAVENOUS

## 2023-07-05 MED ORDER — ROCURONIUM BROMIDE 10 MG/ML (PF) SYRINGE
PREFILLED_SYRINGE | INTRAVENOUS | Status: DC | PRN
  Administered 2023-07-05: 50 mg via INTRAVENOUS

## 2023-07-05 MED ORDER — PROPOFOL 10 MG/ML IV BOLUS
INTRAVENOUS | Status: DC | PRN
  Administered 2023-07-05: 130 mg via INTRAVENOUS

## 2023-07-05 MED ORDER — FENTANYL CITRATE (PF) 100 MCG/2ML IJ SOLN: 25.0000 ug | INTRAMUSCULAR | Status: DC | PRN

## 2023-07-05 MED ORDER — OXYCODONE HCL 5 MG/5ML PO SOLN: 5.0000 mg | Freq: Once | ORAL | Status: DC | PRN

## 2023-07-05 MED ORDER — FENTANYL CITRATE (PF) 100 MCG/2ML IJ SOLN
INTRAMUSCULAR | Status: DC | PRN
Start: 2023-07-05 — End: 2023-07-05
  Administered 2023-07-05: 50 ug via INTRAVENOUS

## 2023-07-05 MED ORDER — SUGAMMADEX SODIUM 200 MG/2ML IV SOLN
INTRAVENOUS | Status: DC | PRN
  Administered 2023-07-05: 200 mg via INTRAVENOUS

## 2023-07-05 MED ORDER — FENTANYL CITRATE (PF) 100 MCG/2ML IJ SOLN
INTRAMUSCULAR | Status: AC
  Filled 2023-07-05: qty 2

## 2023-07-05 MED ORDER — PROPOFOL 500 MG/50ML IV EMUL
INTRAVENOUS | Status: DC | PRN
  Administered 2023-07-05: 25 mg via INTRAVENOUS
  Administered 2023-07-05: 130 ug/kg/min via INTRAVENOUS

## 2023-07-05 MED ORDER — CHLORHEXIDINE GLUCONATE 0.12 % MT SOLN
15.0000 mL | Freq: Once | OROMUCOSAL | Status: AC
  Administered 2023-07-05: 15 mL via OROMUCOSAL

## 2023-07-05 MED ORDER — ONDANSETRON HCL 4 MG/2ML IJ SOLN: 4.0000 mg | Freq: Once | INTRAMUSCULAR | Status: DC | PRN

## 2023-07-05 NOTE — Anesthesia Procedure Notes (Signed)
Procedure Name: Intubation Date/Time: 07/05/2023 9:02 AM  Performed by: Waynard Edwards, CRNAPre-anesthesia Checklist: Patient identified, Emergency Drugs available, Suction available and Patient being monitored Patient Re-evaluated:Patient Re-evaluated prior to induction Oxygen Delivery Method: Circle system utilized Preoxygenation: Pre-oxygenation with 100% oxygen Induction Type: IV induction Ventilation: Mask ventilation without difficulty Laryngoscope Size: Mac Grade View: Grade II Tube type: Oral Tube size: 8.5 mm Number of attempts: 1 Airway Equipment and Method: Stylet Placement Confirmation: ETT inserted through vocal cords under direct vision, positive ETCO2 and breath sounds checked- equal and bilateral Secured at: 23 cm Tube secured with: Tape Dental Injury: Teeth and Oropharynx as per pre-operative assessment

## 2023-07-05 NOTE — Discharge Instructions (Signed)
Flexible Bronchoscopy, Care After This sheet gives you information about how to care for yourself after your test. Your doctor may also give you more specific instructions. If you have problems or questions, contact your doctor. Follow these instructions at home: Eating and drinking Do not eat or drink anything (not even water) for 2 hours after your test, or until your numbing medicine (local anesthetic) wears off. When your numbness is gone and your cough and gag reflexes have come back, you may: Eat only soft foods. Slowly drink liquids. The day after the test, go back to your normal diet. Driving Do not drive for 24 hours if you were given a medicine to help you relax (sedative). Do not drive or use heavy machinery while taking prescription pain medicine. General instructions  Take over-the-counter and prescription medicines only as told by your doctor. Return to your normal activities as told. Ask what activities are safe for you. Do not use any products that have nicotine or tobacco in them. This includes cigarettes and e-cigarettes. If you need help quitting, ask your doctor. Keep all follow-up visits as told by your doctor. This is important. It is very important if you had a tissue sample (biopsy) taken. Get help right away if: You have shortness of breath that gets worse. You get light-headed. You feel like you are going to pass out (faint). You have chest pain. You cough up: More than a little blood. More blood than before. Summary Do not eat or drink anything (not even water) for 2 hours after your test, or until your numbing medicine wears off. Do not use cigarettes. Do not use e-cigarettes. Get help right away if you have chest pain.  This information is not intended to replace advice given to you by your health care provider. Make sure you discuss any questions you have with your health care provider. Document Released: 06/21/2009 Document Revised: 08/06/2017 Document  Reviewed: 09/11/2016 Elsevier Patient Education  2020 [employer]  

## 2023-07-05 NOTE — Anesthesia Postprocedure Evaluation (Signed)
Anesthesia Post Note  Patient: MEMPHIS SHREINER  Procedure(s) Performed: ROBOTIC ASSISTED NAVIGATIONAL BRONCHOSCOPY (Bilateral) BRONCHIAL BIOPSIES     Patient location during evaluation: PACU Anesthesia Type: General Level of consciousness: awake and alert and oriented Pain management: pain level controlled Vital Signs Assessment: post-procedure vital signs reviewed and stable Respiratory status: spontaneous breathing, nonlabored ventilation and respiratory function stable Cardiovascular status: blood pressure returned to baseline and stable Postop Assessment: no apparent nausea or vomiting Anesthetic complications: no   There were no known notable events for this encounter.  Last Vitals:  Vitals:   07/05/23 0649 07/05/23 0947  BP: 114/89 129/77  Pulse: (!) 52 (!) 49  Resp: 18 15  Temp: 36.7 C 36.7 C  SpO2: 98% 100%    Last Pain:  Vitals:   07/05/23 0947  TempSrc:   PainSc: 0-No pain                 Albert Hersch A.

## 2023-07-05 NOTE — Interval H&P Note (Signed)
History and Physical Interval Note:  07/05/2023 7:45 AM  [name] [name]  has presented today for surgery, with the diagnosis of lung nodules.  The various methods of treatment have been discussed with the patient and family. After consideration of risks, benefits and other options for treatment, the patient has consented to  Procedure(s): ROBOTIC ASSISTED NAVIGATIONAL BRONCHOSCOPY (Bilateral) as a surgical intervention.  The patient's history has been reviewed, patient examined, no change in status, stable for surgery.  I have reviewed the patient's chart and labs.  Questions were answered to the patient's satisfaction.     Rachel Bo Korrie Hofbauer

## 2023-07-05 NOTE — Transfer of Care (Signed)
Immediate Anesthesia Transfer of Care Note  Patient: [name] [name]  Procedure(s) Performed: ROBOTIC ASSISTED NAVIGATIONAL BRONCHOSCOPY (Bilateral) BRONCHIAL BIOPSIES  Patient Location: PACU  Anesthesia Type:General  Level of Consciousness: awake, alert , and oriented  Airway & Oxygen Therapy: Patient Spontanous Breathing and Patient connected to face mask oxygen  Post-op Assessment: Report given to RN and Post -op Vital signs reviewed and stable  Post vital signs: Reviewed and stable  Last Vitals:  Vitals Value Taken Time  BP 129/77 07/05/23 0947  Temp 36.7 C 07/05/23 0947  Pulse 51 07/05/23 0951  Resp 13 07/05/23 0951  SpO2 99 % 07/05/23 0951  Vitals shown include unfiled device data.  Last Pain:  Vitals:   07/05/23 0947  TempSrc:   PainSc: 0-No pain         Complications: No notable events documented.

## 2023-07-05 NOTE — Research (Signed)
Title: A multi-center, prospective, single-arm, observational study to evaluate real-world outcomes for the shape-sensing Ion endoluminal system  Primary Outcome: Evaluate procedure characteristics and short and long-term patient outcomes following shape-sensing robotic-assisted bronchoscopy (ssRAB) utilizing the Ion Endoluminal System for lung lesion localization or biopsy.   Protocol # / Study Name: ISI-ION-003 Clinical Trials #: [id number] Sponsor: [employer] Principal Investigator: Dr. Elige Radon Icard  Key Features of Ion Endoluminal System (referred to as "Ion") Ion is the first FDA cleared bronchoscopy system that uses fiber optic shape sensing technology to inform on location within the airways. Its catheter/tool channel has a smaller outer diameter (3.5 mm) in comparison to conventional bronchoscopes, allowing it to navigate into the smaller airways of the periphery.     Key Inclusion Criteria Subject is 18 years or older at the time of the procedure Subject is a candidate for a planned, elective RAB lung lesion localization or biopsy procedure in which the Ion Endoluminal System is planned to be utilized.  Subject  able to understand and adhere to study requirements and provide informed consent.   Key Exclusion Criteria Subject is under the care of a Museum/gallery exhibitions officer and is unable to provide informed consent on their own accord.  Subject is participating in an interventional research study or research study with investigational agents with an unknown safety profile that would interfere with participation or the results of this study.  Male subjects who are pregnant or nursing at the time of the index Ion procedure, as determined by standard site practices. Subjects that are incarcerated or institutionalized under court order, or other vulnerable populations.    Previous Clinical Trials Since receiving FDA clearance in Feb 2019, Ion has been adopted  commercially by over 226 centers in the [place], and utilized in over 40,000 procedures.  The first in-human study enrolled 38 subjects with a mean lesion size of 14.8 mm and the overall diagnostic yield was 79.3%, with no adverse events. 17 (58.6%) lesions were reported to have a bronchus sign available on CT imaging.  A multi-center study published results in 2022, with 270 lesions biopsied in 241 patients using Ion. The mean largest cardinal lesion size was 18.86.19mm, and the mean airway generation count was 7.01.6. Asymptomatic pneumothorax occurred in 3.3% of subjects, and 0.8% experienced airway bleeding.   Another study provided preliminary results in 2022, with 87% sensitivity for malignancy, a diagnostic yield of 81%, and a mean lesion size of 16 mm. 75% of biopsy cases were bronchus-sign negative. 4% of subjects experienced pneumothoraces (including those requiring intervention), and 0.8% of subjects experienced airway bleeding requiring wedging or balloon tamponade.  A single-center study captured 131 consecutive procedures of pulmonary biopsy using Ion. The navigational success rate was 98.7%, with an overall diagnostic yield of 81.7%, an overall complication rate of 3%, and a pneumothorax rate of 1.5%.    [facility] Clinical Research Coordinator note:   This visit for Subject [name] [name] with DOB: May 15, 1941 on 07/05/2023 for the above protocol is Visit/Encounter # Pre-procedure, Intra-procedure and Post-procedure, and is for purpose of research.   The consent for this encounter is under:  Protocol Version 1.0 Investigator Brochure Version N/A Consent Version Revision A, dated 14Nov2023 and is currently IRB approved.   JHOAN PIEROTTI expressed continued interest and consent in continuing as a study subject. Subject confirmed that there was no change in contact information (e.g. address, telephone, email). Subject thanked for participation in research and  contribution to science. In  this visit 07/05/2023 the subject will be evaluated by Principal Investigator named Dr. Tonia Brooms. This research coordinator has verified that the above investigator is up to date with his/her training logs.   The Subject was informed that the PI continues to have oversight of the subject's visits and course through relevant discussions, reviews, and also specifically of this visit by routing of this note to the PI.  The research study was discussed with the subject in the pre-operative room. The study was explained in detail including all the contents of the informed consent document. The subject was encouraged to ask questions. All questions were answered to their satisfaction. The IRB approved informed consent was signed, and a copy was given to the subject. After obtaining consent, the subject underwent scheduled procedure using the ion endoluminal system. Data collection was completed per protocol. Refer to paper source subject binder for further details.      Signed by  Verdene Lennert Clinical Research Coordinator / Sub-Investigator  [facility]  [place], [place] 9:42 AM 07/05/2023

## 2023-07-05 NOTE — Op Note (Signed)
Video Bronchoscopy with Robotic Assisted Bronchoscopic Navigation   Date of Operation: 07/05/2023   Pre-op Diagnosis: pulmonary nodules   Post-op Diagnosis: pulmonary nodules   Surgeon: Josephine Igo, DO   Assistants: None   Anesthesia: General endotracheal anesthesia  Operation: Flexible video fiberoptic bronchoscopy with robotic assistance and biopsies.  Estimated Blood Loss: Minimal  Complications: None  Indications and History: [name] [name] is a 82 y.o. male with history of nodules. The risks, benefits, complications, treatment options and expected outcomes were discussed with the patient.  The possibilities of pneumothorax, pneumonia, reaction to medication, pulmonary aspiration, perforation of a viscus, bleeding, failure to diagnose a condition and creating a complication requiring transfusion or operation were discussed with the patient who freely signed the consent.    Description of Procedure: The patient was seen in the Preoperative Area, was examined and was deemed appropriate to proceed.  The patient was taken to [facility] endoscopy room 3, identified as [place] and the procedure verified as Flexible Video Fiberoptic Bronchoscopy.  A Time Out was held and the above information confirmed.   Prior to the date of the procedure a high-resolution CT scan of the chest was performed. Utilizing ION software program a virtual tracheobronchial tree was generated to allow the creation of distinct navigation pathways to the patient's parenchymal abnormalities. After being taken to the operating room general anesthesia was initiated and the patient  was orally intubated. The video fiberoptic bronchoscope was introduced via the endotracheal tube and a general inspection was performed which showed normal right and left lung anatomy, aspiration of the bilateral mainstems was completed to remove any remaining secretions. Robotic catheter inserted into patient's endotracheal tube.    Target #1 RLL: The distinct navigation pathways prepared prior to this procedure were then utilized to navigate to patient's lesion identified on CT scan. The robotic catheter was secured into place and the vision probe was withdrawn.  Lesion location was approximated using fluoroscopy and 3D CBCT for CT guided needle placement and for peripheral targeting. Under fluoroscopic guidance transbronchial forceps biopsies were performed to be sent for cytology and pathology.   At the end of the procedure a general airway inspection was performed and there was no evidence of active bleeding. The bronchoscope was removed.  The patient tolerated the procedure well. There was no significant blood loss and there were no obvious complications. A post-procedural chest x-ray is pending.  Samples Target #1: 1. Transbronchial forceps biopsies from RLL  Plans:  The patient will be discharged from the PACU to home when recovered from anesthesia and after chest x-ray is reviewed. We will review the cytology, pathology and microbiology results with the patient when they become available. Outpatient followup will be with Kandice Robinsons, NP  Josephine Igo, DO [facility] 07/05/2023 9:43 AM

## 2023-07-06 ENCOUNTER — Encounter (HOSPITAL_COMMUNITY): Payer: Self-pay | Admitting: Pulmonary Disease

## 2023-07-07 LAB — CYTOLOGY - NON PAP

## 2023-07-08 ENCOUNTER — Other Ambulatory Visit: Payer: Self-pay | Admitting: Cardiology

## 2023-07-08 DIAGNOSIS — S72001S Fracture of unspecified part of neck of right femur, sequela: Secondary | ICD-10-CM | POA: Diagnosis not present

## 2023-07-08 DIAGNOSIS — R2681 Unsteadiness on feet: Secondary | ICD-10-CM | POA: Diagnosis not present

## 2023-07-08 DIAGNOSIS — Z96641 Presence of right artificial hip joint: Secondary | ICD-10-CM | POA: Diagnosis not present

## 2023-07-08 DIAGNOSIS — R2689 Other abnormalities of gait and mobility: Secondary | ICD-10-CM | POA: Diagnosis not present

## 2023-07-08 DIAGNOSIS — M25551 Pain in right hip: Secondary | ICD-10-CM | POA: Diagnosis not present

## 2023-07-08 DIAGNOSIS — M62551 Muscle wasting and atrophy, not elsewhere classified, right thigh: Secondary | ICD-10-CM | POA: Diagnosis not present

## 2023-07-13 ENCOUNTER — Other Ambulatory Visit: Payer: Self-pay

## 2023-07-13 ENCOUNTER — Encounter: Payer: Self-pay | Admitting: Acute Care

## 2023-07-13 ENCOUNTER — Ambulatory Visit (INDEPENDENT_AMBULATORY_CARE_PROVIDER_SITE_OTHER): Payer: Medicare Other | Admitting: Acute Care

## 2023-07-13 ENCOUNTER — Encounter: Payer: Self-pay | Admitting: Neurology

## 2023-07-13 VITALS — BP 92/60 | HR 58 | Temp 97.8°F | Ht 65.0 in | Wt 147.4 lb

## 2023-07-13 DIAGNOSIS — Z9221 Personal history of antineoplastic chemotherapy: Secondary | ICD-10-CM | POA: Diagnosis not present

## 2023-07-13 DIAGNOSIS — R918 Other nonspecific abnormal finding of lung field: Secondary | ICD-10-CM

## 2023-07-13 DIAGNOSIS — Z9889 Other specified postprocedural states: Secondary | ICD-10-CM

## 2023-07-13 DIAGNOSIS — Z8546 Personal history of malignant neoplasm of prostate: Secondary | ICD-10-CM

## 2023-07-13 DIAGNOSIS — Z8507 Personal history of malignant neoplasm of pancreas: Secondary | ICD-10-CM

## 2023-07-13 DIAGNOSIS — G20A1 Parkinson's disease without dyskinesia, without mention of fluctuations: Secondary | ICD-10-CM

## 2023-07-13 MED ORDER — CARBIDOPA-LEVODOPA ER 50-200 MG PO TBCR: 1.0000 | EXTENDED_RELEASE_TABLET | Freq: Every day | ORAL | 0 refills | Status: DC

## 2023-07-13 NOTE — Progress Notes (Signed)
OV with SG today   Thanks,  BLI  Josephine Igo, DO [facility] 07/13/2023 10:17 AM

## 2023-07-13 NOTE — Progress Notes (Signed)
History of Present Illness [name] [name] is a 82 y.o. male with abnormal CT chest imaging. He was referred by Dr. Mosetta Putt 06/2023  Synopsis 82 year old gentleman, past medical history of Parkinson's disease, prostate cancer metastatic to bone, history of pancreatic cancer into 2023. Status post chemo and radiation now .Patient followed by Dr. Mosetta Putt from medical oncology. Patient also has atrial flutter fibrillation on Eliquis. Presents today with abnormal CT imaging and abnormal pet imaging. Patient found to have hypermetabolic lesion within the right lung concerning for metastasis also smaller lesions that are within the bilateral lower lobes of the lung concerning for metastasis. Patient was referred to Dr. Tonia Brooms  for consideration of robotic assisted navigational bronchoscopy and tissue sampling. Patient has been discussed with radiation oncology as well as interventional radiology. Decision was made for bronchoscopy and biopsy. This was done by Dr. Tonia Brooms 07/05/2023. Pt. Is here today with his sisters to discuss the results of the biopsy and ensure he has done well post procedure.   07/13/2023 Discussed the use of AI scribe software for clinical note transcription with the patient, who gave verbal consent to proceed.  History of Present Illness   The patient, a former smoker with a history of pancreatic cancer, presents for follow-up after a surveillance scan revealed multiple pulmonary nodules. The patient underwent a biopsy of one of these nodules, which revealed rare atypical cells, insufficient for a definitive diagnosis of malignancy. The patient reports no recent weight loss, and no symptoms of complications of his procedure such as bleeding, fever, or discolored secretions, or complications of anesthesia post-procedure.  The patient's history of pancreatic cancer is notable, with a recent increase in CA19 antigen levels from 208 to 330 over the past month, after a period of decline. The  patient's smoking history is remote, having quit in January 2012.  The patient's pulmonary nodules were discovered during surveillance scans for his pancreatic cancer. The largest nodule measured 1.5 cm by 1.1 cm, with several others present. The biopsy was performed on a nodule in the right lower lobe, measuring 0.5 cm by 0.4 cm.  The patient's current situation is complex due to the inconclusive biopsy results, leaving uncertainty as to whether the pulmonary nodules represent metastasis from the pancreatic cancer or a new primary lung cancer. The patient awaits further discussion and decision-making regarding his care plan Through the thoracic conference. He restarted chemo through Dr. Mosetta Putt 11.21.2024, and has a restaging Ct Chest 10/01/2023.     Test Results: Cytology FINAL MICROSCOPIC DIAGNOSIS:  A. LUNG, RLL TARGET #1, BIOPSY:  -  Rare atypical cells present   PET scan 06/24/2023 Multiple hypermetabolic pulmonary nodules concerning for potential metastatic lesions. Tissue sampling should be considered if clinically appropriate. 2. Low-level metabolic activity in the region of the pancreatic head likely reflects the treated pancreatic neoplasm. Diffuse dilatation of the main pancreatic duct is again noted. 3. Healing nondisplaced fracture of the anterolateral left fifth rib. 4. Colonic diverticulosis without evidence of acute diverticulitis at this time. 5. Aortic atherosclerosis, in addition to left main and 2 vessel coronary artery disease. 6. Additional incidental findings, as above.  Super D CT Chest 07/01/2023 Reduced solidity of the right middle lobe nodule which now appears to have a branching morphology, with aerated portions in between the branches. This lesion was formerly completely solid. 2. Other pulmonary nodules are stable or nearly stable. 3. Coronary, aortic arch, and branch vessel atherosclerotic vascular disease. 4. Cholelithiasis. 5. Stable appearance of  compression fractures at  T7, T8, T9, L1, and L2 with vertebral augmentations at T8, L1, and L2. 6. Healing fracture of the left anterior fifth rib. 7. Aortic atherosclerosis.    Latest Ref Rng & Units 07/05/2023    7:01 AM 05/20/2023    3:03 PM 05/17/2023   11:06 AM  CBC  WBC 4.0 - 10.5 K/uL 4.5  6.1  4.8   Hemoglobin 13.0 - 17.0 g/dL 54.0  98.1  19.1   Hematocrit 39.0 - 52.0 % 41.2  39.1  41.2   Platelets 150 - 400 K/uL 172  199  213        Latest Ref Rng & Units 07/05/2023    7:01 AM 05/20/2023    3:03 PM 05/17/2023   11:06 AM  BMP  Glucose 70 - 99 mg/dL 95  478  86   BUN 8 - 23 mg/dL 21  25  22    Creatinine 0.61 - 1.24 mg/dL 2.95  6.21  3.08   Sodium 135 - 145 mmol/L 139  138  143   Potassium 3.5 - 5.1 mmol/L 3.9  4.9  4.3   Chloride 98 - 111 mmol/L 106  108  108   CO2 22 - 32 mmol/L 23  26  29    Calcium 8.9 - 10.3 mg/dL 9.4  9.9  9.8     BNP No results found for: "BNP"  ProBNP No results found for: "PROBNP"  PFT No results found for: "FEV1PRE", "FEV1POST", "FVCPRE", "FVCPOST", "TLC", "DLCOUNC", "PREFEV1FVCRT", "PSTFEV1FVCRT"  CT Super D Chest Wo Contrast  Result Date: 07/05/2023 CLINICAL DATA:  Pancreatic cancer, hypermetabolic pulmonary nodules. * Tracking Code: BO * EXAM: CT CHEST WITHOUT CONTRAST TECHNIQUE: Multidetector CT imaging of the chest was performed using thin slice collimation for electromagnetic bronchoscopy planning purposes, without intravenous contrast. RADIATION DOSE REDUCTION: This exam was performed according to the departmental dose-optimization program which includes automated exposure control, adjustment of the mA and/or kV according to patient size and/or use of iterative reconstruction technique. COMPARISON:  05/17/2023 FINDINGS: Cardiovascular: Biapical pleuroparenchymal scarring. Coronary, aortic arch, and branch vessel atherosclerotic vascular disease. Mediastinum/Nodes: Unremarkable Lungs/Pleura: Reduce solidity of the right middle lobe nodule  which now appears to have a branching morphology on images 102-107 of series 5, with aerated portions in between the branches as shown for example on image 106 of series 5. This lesion was formerly completely solid. The clustered nodules measure 1.5 by 1.1 cm on image 105 series 5, the previously solid lesion in this vicinity previously measured 1.5 by 1.6 cm. Left lower lobe pulmonary nodule with somewhat hazy margins, 1.1 by 0.8 cm on image 94 series 5, previously the solid component measured 1.1 by 0.6 cm. Subpleural right lower lobe nodule on image 102 of series 5 measures 0.8 by 0.9 cm, formerly 0.7 by 0.9 cm. Subpleural right lower lobe nodule on image 102 of series 5 measures 0.5 by 0.4 cm, formerly the same. Subpleural nodule in the right costophrenic sulcus medially on image 138 series 5 measures 0.8 by 0.4 cm, stable. Subpleural nodule in the left lower lobe on image 122 series 5 measures 0.7 by 0.4 cm, formerly 0.6 by 0.4 cm by my measurements. A left lower lobe nodule on image 114 of series 5 measures 0.6 by 0.3 cm, formerly 0.5 by 0.3 cm. Upper Abdomen: High density in the gallbladder compatible with gallstones. Stable probable cyst in the liver on image 54 series 2. Hypodensity in the pancreatic tail and body likely from distended dorsal pancreatic duct. Musculoskeletal:  Stable appearance of compression fractures at T7, T8, T9, L1, and L2 with vertebral augmentations at T8, L1, and L2. Healing fracture of the left anterior fifth rib. IMPRESSION: 1. Reduced solidity of the right middle lobe nodule which now appears to have a branching morphology, with aerated portions in between the branches. This lesion was formerly completely solid. 2. Other pulmonary nodules are stable or nearly stable. 3. Coronary, aortic arch, and branch vessel atherosclerotic vascular disease. 4. Cholelithiasis. 5. Stable appearance of compression fractures at T7, T8, T9, L1, and L2 with vertebral augmentations at T8, L1, and L2.  6. Healing fracture of the left anterior fifth rib. 7. Aortic atherosclerosis. Aortic Atherosclerosis (ICD10-I70.0). Electronically Signed   By: Gaylyn Rong M.D.   On: 07/05/2023 13:12   DG Chest Port 1 View  Result Date: 07/05/2023 CLINICAL DATA:  Status post bronchoscopy EXAM: PORTABLE CHEST 1 VIEW COMPARISON:  Chest radiograph dated 05/08/2023 FINDINGS: Low lung volumes with bronchovascular crowding. Bilateral interstitial and bibasilar patchy opacities. No pleural effusion or pneumothorax. Enlarged cardiomediastinal silhouette is likely projectional. No acute osseous abnormality. IMPRESSION: 1. Low lung volumes with bronchovascular crowding. Bilateral interstitial and bibasilar patchy opacities, may reflect atelectasis, pulmonary edema, or small foci of hemorrhage. 2. No pneumothorax. Electronically Signed   By: Agustin Cree M.D.   On: 07/05/2023 11:41   DG C-ARM BRONCHOSCOPY  Result Date: 07/05/2023 C-ARM BRONCHOSCOPY: Fluoroscopy was utilized by the requesting physician.  No radiographic interpretation.     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 wine   Drug use: Never    Mr.Hawbaker reports that he quit smoking about 12 years  ago. His smoking use included cigars. He has never used smokeless tobacco. He reports that he does not currently use alcohol after a past usage of about 4.0 standard drinks of alcohol per week. He reports that he does not use drugs.  Tobacco Cessation: Former smoker with a 20 + pack year smoking history, Quit 2012   Past surgical hx, Family hx, Social hx all reviewed.  Current Outpatient Medications on File Prior to Visit  Medication Sig   acetaminophen (TYLENOL) 500 MG tablet Take 1 tablet (500 mg total) by mouth every 6 (six) hours as needed for moderate pain or mild pain.   ammonium lactate (LAC-HYDRIN) 12 % lotion Apply 1 Application topically as needed.   bisacodyl (DULCOLAX) 10 MG suppository Place 1 suppository (10 mg total) rectally daily as needed for moderate constipation.   carbidopa-levodopa (SINEMET CR) 50-200 MG tablet TAKE 1 TABLET AT BEDTIME   carbidopa-levodopa (  SINEMET IR) 25-100 MG tablet Take 3 tablets at 7 AM, 2 tablets at 11 AM, 3 tablets at 3 PM, 2 tablets at 7 PM   ciclopirox (PENLAC) 8 % solution Apply 1 Application topically at bedtime.   clonazePAM (KLONOPIN) 0.5 MG tablet Take 1 tablet (0.5 mg total) by mouth at bedtime.   cyanocobalamin (VITAMIN B12) 1000 MCG tablet Take 1,000 mcg by mouth daily.   diltiazem (CARDIZEM) 30 MG tablet Take 1 tablet (30 mg total) by mouth 2 (two) times daily. Do not take if systolic blood pressure (top number) is less than 100, or if pulse if less than 55.   ELIQUIS 5 MG TABS tablet TAKE 1 TABLET TWICE A DAY   Glycerin-Hypromellose-PEG 400 (VISINE DRY EYE OP) Place 1 drop into both eyes 2 (two) times daily as needed (eye irritation).    ketoconazole (NIZORAL) 2 % shampoo Apply 1 application  topically 3 (three) times a week.   leuprolide, 6 Month, (ELIGARD) 45 MG injection Inject 45 mg into the skin every 6 (six) months.   omeprazole (PRILOSEC) 20 MG capsule Take 1 capsule (20 mg total) by mouth daily.   ondansetron (ZOFRAN) 4 MG  tablet Take 1 tablet (4 mg total) by mouth every 6 (six) hours as needed for nausea.   oxyCODONE (OXY IR/ROXICODONE) 5 MG immediate release tablet Take 1 tablet (5 mg total) by mouth every 6 (six) hours as needed for severe pain.   PANCREAZE 37000-97300 units CPEP TAKE 2 CAPSULES WITH BREAKFAST, LUNCH, AND EVENING MEAL (WITH EACH MEAL) AND 1 CAPSULE WITH SNACK   polyethylene glycol powder (MIRALAX) 17 GM/SCOOP powder Take 17 g by mouth in the morning, at noon, and at bedtime.   rosuvastatin (CRESTOR) 10 MG tablet TAKE 1 TABLET DAILY   tiZANidine (ZANAFLEX) 2 MG tablet Take 1 tablet (2 mg total) by mouth every 8 (eight) hours as needed for muscle spasms.   triamcinolone cream (KENALOG) 0.1 % Apply 1 application topically 2 (two) times daily. (Patient taking differently: Apply 1 application  topically 2 (two) times daily as needed (itching).)   zoledronic acid (RECLAST) 5 MG/100ML SOLN injection Inject 5 mg into the vein See admin instructions. Once a year   No current facility-administered medications on file prior to visit.     Allergies  Allergen Reactions   Influenza Vaccines Anaphylaxis    Flu shot: 1974 anaphylaxis. 2nd time swollen arm   Influenza Virus Vaccine Other (See Comments)    Review Of Systems:  Constitutional:   No  weight loss, night sweats,  Fevers, chills, fatigue, or  lassitude.  HEENT:   No headaches,  Difficulty swallowing,  Tooth/dental problems, or  Sore throat,                No sneezing, itching, ear ache, nasal congestion, post nasal drip,   CV:  No chest pain,  Orthopnea, PND, swelling in lower extremities, anasarca, dizziness, palpitations, syncope.   GI  No heartburn, indigestion, abdominal pain, nausea, vomiting, diarrhea, change in bowel habits, loss of appetite, bloody stools.   Resp: No shortness of breath with exertion or at rest.  No excess mucus, no productive cough,  No non-productive cough,  No coughing up of blood.  No change in color of mucus.  No  wheezing.  No chest wall deformity  Skin: no rash or lesions.  GU: no dysuria, change in color of urine, no urgency or frequency.  No flank pain, no hematuria   MS:  No  joint pain or swelling.  No decreased range of motion.  No back pain.  Psych:  No change in mood or affect. No depression or anxiety.  No memory loss.   Vital Signs BP 92/60 (BP Location: Left Arm, Cuff Size: Normal)   Pulse (!) 58   Temp 97.8 F (36.6 C) (Temporal)   Ht 5\' 5"  (1.651 m)   Wt 147 lb 6.4 oz (66.9 kg)   SpO2 97%   BMI 24.53 kg/m    Physical Exam:  General- No distress,  A&Ox3, Pleasant ENT: No sinus tenderness, TM clear, pale nasal mucosa, no oral exudate,no post nasal drip, no LAN Cardiac: S1, S2, regular rate and rhythm, no murmur Chest: No wheeze/ rales/ dullness; no accessory muscle use, no nasal flaring, no sternal retractions, diminished per bases Abd.: Soft Non-tender, ND, BS +, Body mass index is 24.53 kg/m.  Ext: No clubbing cyanosis, edema, no obvious lesions Neuro:  normal strength, MAE x 4, A&O x 3 Skin: No rashes, warm and dry, no obvious lesions  Psych: normal mood and behavior  Filed Weights   07/13/23 0944  Weight: 147 lb 6.4 oz (66.9 kg)     Assessment/Plan    Pulmonary Nodules Multiple PET-avid nodules identified on surveillance scan in a patient with a history of pancreatic cancer. Biopsy revealed atypical cells, but was inconclusive for malignancy. Unclear if these nodules represent metastatic pancreatic cancer or a new primary lung cancer. -Plan to discuss the case at the upcoming thoracic conference for multidisciplinary input on the next steps. -Communicate with Dr. Mosetta Putt and Dr. Tonia Brooms regarding the inconclusive biopsy results and the plan to discuss the case at the thoracic conference.  Pancreatic Cancer History of pancreatic cancer with recent increase in CA19-9 antigen levels. -Continue current management under Dr. Mosetta Putt. -Communicate with Dr. Mosetta Putt regarding the  recent biopsy results and the plan to discuss the case at the thoracic conference.      Addendum He restarted chemo through Dr. Mosetta Putt 11.21.2024, and has a restaging Ct Chest 10/01/2023.  I spent 35 minutes dedicated to the care of this patient on the date of this encounter to include pre-visit review of records, face-to-face time with the patient discussing conditions above, post visit ordering of testing, clinical documentation with the electronic health record, making appropriate referrals as documented, and communicating necessary information to the patient's healthcare team.   Bevelyn Ngo, NP 07/13/2023  10:06 AM

## 2023-07-13 NOTE — Patient Instructions (Addendum)
It is good to see you today. I am glad you have done well after your procedure.  The biopsy was read as atypical cells. This is a non diagnostic result. I have asked that we discuss your case in the thoracic conference this week. I am hopeful the group will determine a good plan moving forward.  If there is not a conference, I will reach out to Dr. Mosetta Putt and [facility].  They will help determine next best steps.  Please contact office for sooner follow up if symptoms do not improve or worsen or seek emergency care

## 2023-07-15 DIAGNOSIS — R2681 Unsteadiness on feet: Secondary | ICD-10-CM | POA: Diagnosis not present

## 2023-07-15 DIAGNOSIS — M62551 Muscle wasting and atrophy, not elsewhere classified, right thigh: Secondary | ICD-10-CM | POA: Diagnosis not present

## 2023-07-15 DIAGNOSIS — S72001S Fracture of unspecified part of neck of right femur, sequela: Secondary | ICD-10-CM | POA: Diagnosis not present

## 2023-07-15 DIAGNOSIS — M25551 Pain in right hip: Secondary | ICD-10-CM | POA: Diagnosis not present

## 2023-07-15 DIAGNOSIS — R2689 Other abnormalities of gait and mobility: Secondary | ICD-10-CM | POA: Diagnosis not present

## 2023-07-15 DIAGNOSIS — Z96641 Presence of right artificial hip joint: Secondary | ICD-10-CM | POA: Diagnosis not present

## 2023-07-20 ENCOUNTER — Encounter: Payer: Self-pay | Admitting: Hematology

## 2023-07-21 ENCOUNTER — Other Ambulatory Visit: Payer: Self-pay

## 2023-07-21 DIAGNOSIS — R2681 Unsteadiness on feet: Secondary | ICD-10-CM | POA: Diagnosis not present

## 2023-07-21 DIAGNOSIS — S72001S Fracture of unspecified part of neck of right femur, sequela: Secondary | ICD-10-CM | POA: Diagnosis not present

## 2023-07-21 DIAGNOSIS — Z96641 Presence of right artificial hip joint: Secondary | ICD-10-CM | POA: Diagnosis not present

## 2023-07-21 DIAGNOSIS — R2689 Other abnormalities of gait and mobility: Secondary | ICD-10-CM | POA: Diagnosis not present

## 2023-07-21 DIAGNOSIS — M62551 Muscle wasting and atrophy, not elsewhere classified, right thigh: Secondary | ICD-10-CM | POA: Diagnosis not present

## 2023-07-21 DIAGNOSIS — M25551 Pain in right hip: Secondary | ICD-10-CM | POA: Diagnosis not present

## 2023-07-22 ENCOUNTER — Inpatient Hospital Stay: Payer: Medicare Other | Attending: Internal Medicine | Admitting: Hematology

## 2023-07-22 ENCOUNTER — Inpatient Hospital Stay: Payer: Medicare Other

## 2023-07-22 ENCOUNTER — Encounter: Payer: Self-pay | Admitting: Hematology

## 2023-07-22 ENCOUNTER — Other Ambulatory Visit: Payer: Self-pay

## 2023-07-22 VITALS — BP 126/88 | HR 54 | Temp 97.8°F | Resp 18 | Ht 65.0 in | Wt 149.7 lb

## 2023-07-22 DIAGNOSIS — G20A1 Parkinson's disease without dyskinesia, without mention of fluctuations: Secondary | ICD-10-CM | POA: Insufficient documentation

## 2023-07-22 DIAGNOSIS — Z87891 Personal history of nicotine dependence: Secondary | ICD-10-CM | POA: Diagnosis not present

## 2023-07-22 DIAGNOSIS — Z9049 Acquired absence of other specified parts of digestive tract: Secondary | ICD-10-CM | POA: Insufficient documentation

## 2023-07-22 DIAGNOSIS — G4730 Sleep apnea, unspecified: Secondary | ICD-10-CM | POA: Diagnosis not present

## 2023-07-22 DIAGNOSIS — R21 Rash and other nonspecific skin eruption: Secondary | ICD-10-CM | POA: Insufficient documentation

## 2023-07-22 DIAGNOSIS — C250 Malignant neoplasm of head of pancreas: Secondary | ICD-10-CM | POA: Insufficient documentation

## 2023-07-22 DIAGNOSIS — K5900 Constipation, unspecified: Secondary | ICD-10-CM | POA: Diagnosis not present

## 2023-07-22 DIAGNOSIS — G8929 Other chronic pain: Secondary | ICD-10-CM | POA: Insufficient documentation

## 2023-07-22 DIAGNOSIS — Z887 Allergy status to serum and vaccine status: Secondary | ICD-10-CM | POA: Diagnosis not present

## 2023-07-22 DIAGNOSIS — R5383 Other fatigue: Secondary | ICD-10-CM | POA: Insufficient documentation

## 2023-07-22 DIAGNOSIS — R918 Other nonspecific abnormal finding of lung field: Secondary | ICD-10-CM | POA: Diagnosis not present

## 2023-07-22 DIAGNOSIS — I700 Atherosclerosis of aorta: Secondary | ICD-10-CM | POA: Diagnosis not present

## 2023-07-22 DIAGNOSIS — N183 Chronic kidney disease, stage 3 unspecified: Secondary | ICD-10-CM | POA: Diagnosis not present

## 2023-07-22 DIAGNOSIS — Z7901 Long term (current) use of anticoagulants: Secondary | ICD-10-CM | POA: Insufficient documentation

## 2023-07-22 DIAGNOSIS — Z905 Acquired absence of kidney: Secondary | ICD-10-CM | POA: Diagnosis not present

## 2023-07-22 DIAGNOSIS — C7951 Secondary malignant neoplasm of bone: Secondary | ICD-10-CM | POA: Diagnosis not present

## 2023-07-22 DIAGNOSIS — N400 Enlarged prostate without lower urinary tract symptoms: Secondary | ICD-10-CM | POA: Diagnosis not present

## 2023-07-22 DIAGNOSIS — C61 Malignant neoplasm of prostate: Secondary | ICD-10-CM | POA: Diagnosis not present

## 2023-07-22 DIAGNOSIS — Z79899 Other long term (current) drug therapy: Secondary | ICD-10-CM | POA: Insufficient documentation

## 2023-07-22 DIAGNOSIS — M545 Low back pain, unspecified: Secondary | ICD-10-CM | POA: Diagnosis not present

## 2023-07-22 DIAGNOSIS — C259 Malignant neoplasm of pancreas, unspecified: Secondary | ICD-10-CM

## 2023-07-22 LAB — CBC WITH DIFFERENTIAL (CANCER CENTER ONLY)
Abs Immature Granulocytes: 0.01 10*3/uL (ref 0.00–0.07)
Basophils Absolute: 0 10*3/uL (ref 0.0–0.1)
Basophils Relative: 0 %
Eosinophils Absolute: 0.2 10*3/uL (ref 0.0–0.5)
Eosinophils Relative: 3 %
HCT: 40.2 % (ref 39.0–52.0)
Hemoglobin: 13 g/dL (ref 13.0–17.0)
Immature Granulocytes: 0 %
Lymphocytes Relative: 21 %
Lymphs Abs: 1.1 10*3/uL (ref 0.7–4.0)
MCH: 29.7 pg (ref 26.0–34.0)
MCHC: 32.3 g/dL (ref 30.0–36.0)
MCV: 92 fL (ref 80.0–100.0)
Monocytes Absolute: 0.5 10*3/uL (ref 0.1–1.0)
Monocytes Relative: 10 %
Neutro Abs: 3.5 10*3/uL (ref 1.7–7.7)
Neutrophils Relative %: 66 %
Platelet Count: 178 10*3/uL (ref 150–400)
RBC: 4.37 MIL/uL (ref 4.22–5.81)
RDW: 15 % (ref 11.5–15.5)
WBC Count: 5.3 10*3/uL (ref 4.0–10.5)
nRBC: 0 % (ref 0.0–0.2)

## 2023-07-22 LAB — CMP (CANCER CENTER ONLY)
ALT: <5 U/L (ref 0–44)
AST: 25 U/L (ref 15–41)
Albumin: 4.2 g/dL (ref 3.5–5.0)
Alkaline Phosphatase: 57 U/L (ref 38–126)
Anion gap: 5 (ref 5–15)
BUN: 18 mg/dL (ref 8–23)
CO2: 31 mmol/L (ref 22–32)
Calcium: 9.8 mg/dL (ref 8.9–10.3)
Chloride: 107 mmol/L (ref 98–111)
Creatinine: 0.96 mg/dL (ref 0.61–1.24)
GFR, Estimated: >60 mL/min (ref >60)
Glucose, Bld: 95 mg/dL (ref 70–99)
Potassium: 3.9 mmol/L (ref 3.5–5.1)
Sodium: 143 mmol/L (ref 135–145)
Total Bilirubin: 1 mg/dL (ref <1.2)
Total Protein: 7.1 g/dL (ref 6.5–8.1)

## 2023-07-22 NOTE — Progress Notes (Signed)
[facility]   Telephone:([phone]) [phone] Fax:([phone]   Clinic Follow up Note   Patient Care Team: Lorenda Ishihara, MD as PCP - General (Internal Medicine) Tessa Lerner, DO as PCP - Cardiology (Cardiology) Tat, Octaviano Batty, DO as Consulting Physician (Neurology) Earna Coder, MD as Consulting Physician (Hematology and Oncology) Fritzi Mandes, MD as Consulting Physician (General Surgery) Malachy Mood, MD as Consulting Physician (Hematology and Oncology) Josephine Igo, DO as Consulting Physician (Pulmonary Disease)  Date of Service:  07/22/2023  CHIEF COMPLAINT: f/u of pancreatic cancer  CURRENT THERAPY:  Pending chemotherapy gemcitabine and AbraxaneOncology History   Pancreatic cancer [facility]) diagnosed in 09/2021, deemed poor surgical candidate due to his age and medical co-morbidities -S/p single agent gemcitabine 01/13/22 - 04/01/22 and 5 fx SBRT [name]) 05/04/22 - 05/14/22. CA 19-9 decreased after treatment -Surveillance CT 11/09/2022 showed slightly decreased size of pancreatic head mass, no evidence of metastatic disease.  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  He is taking ibuprofen as needed.  No other clinical concern for cancer progression. -Lab reviewed, overall stable, tumor marker still pending. -He was admitted for right femoral neck fracture  on 02/03/23 after a fall and had surgery  -CT scan from May 17, 2023 showed multiple lung nodules, with dominant 1.5 cm nodule in the right middle lobe, highly concerning for metastasis.  His lung lesions are positive on PET scan. -Bronchoscopy and the biopsy attempted, which showed atypical cells.    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 Recent PET scan showed a suspicious lung  nodule, likely metastasis. Biopsy revealed atypical cells, highly suspicious for cancer. Discussed chemotherapy with gemcitabine and Abraxane, including risks (fatigue, low blood count, infection, hair loss) and benefits (potential tumor shrinkage, pain relief). Chemotherapy is not curative but may prolong life and improve symptoms. Patient prefers combination therapy for better efficacy. 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 Currently using lidocaine patches with limited relief. Plans to consult orthopedic surgeon for potential cortisone injection. - Consult orthopedic surgeon for potential cortisone injection - Continue using lidocaine patches as needed  General Health Maintenance Living in assisted living, able to perform basic self-care. No recent significant weight loss. Blood counts, kidney, and liver function are normal. - Ensure adequate nutrition and hydration  Follow-up -Plan to start chemotherapy Abraxane and gemcitabine session for mid-next week -He will call radiology to schedule CT scan and port placement - Monitor and adjust pain management as needed, he will try oxycodone for his pain now. - Follow up in clinic before the first treatment next week.       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 Sclerotic 1.5 cm left ischial lesion/ Sclerotic medial left iliac bone 1.5 cm lesion (series 2/image 47), increased from 1.0 cm. Gleason score of 4+5= 9;  with almost all cores involved greater than  80%.  9/16 Lupron 73-month depot on 9/16. [Urology; Dr.Siniski]  # MID OCT 2020- Zytiga 1000 mg+ prednisone; stopped December 2021 [poor tolerance if with RVR]; DISCONTINUED.   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       Prostate cancer metastatic to bone (HCC)  05/24/2019 Initial Diagnosis   Prostate cancer metastatic to bone [facility])   Pancreatic cancer (HCC)  10/02/2021 Imaging   CT ABDOMEN PELVIS W CONTRAST   IMPRESSION: 1. There is new pancreatic ductal dilation with an indeterminate 19 mm hypodense low-density mass area in the head/uncinate process of the pancreas. Recommend further evaluation with dedicated MRI/MRCP with contrast. 2. No evidence of bowel obstruction.  Moderate rectal stool ball. 3. Scattered peripherally located clustered pulmonary nodules in the RIGHT middle lobe. Findings are likely infectious or inflammatory in etiology. Given history of malignancy, recommend follow-up as per clinical protocol.   10/28/2021 Imaging   MR ABDOMEN MRCP W WO CONTAST   IMPRESSION: 1. Examination is significantly limited by breath motion artifact throughout. 2. The main pancreatic duct is diffusely dilated from the level of the superior pancreatic head, measuring up to 0.7 cm. 3. In the inferior pancreatic head and uncinate, there is a multilobulated, fluid signal cystic lesion measuring 1.8 x 1.0 cm. Due to breath motion artifact it is difficult to determine whether this communicates to the adjacent duct. There are multiple additional subcentimeter cystic lesions scattered throughout the pancreas, several of which clearly communicate to the main pancreatic duct. 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 Recommend EUS/FNA for further diagnosis given the presence of pancreatic ductal dilatation. 4. Status post left nephrectomy. 5. No evidence of recurrent or metastatic disease in the abdomen.   12/25/2021 Procedure   UPPER ENDOSCOPIC ULTRASOUND-By Dr. Meridee Score  - A mass-like region was identified in the pancreatic head where the pancreatic duct dilates with multiple cystic regions noted throughout the pancreas as well. 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 However, the endosonographic appearance is suspicious for potential adenocarcinoma. This was staged T2 N0 Mx by endosonographic criteria. T - No malignant-appearing lymph nodes were visualized in the celiac region (level 20), peripancreatic region and porta hepatis region.   12/25/2021 Pathology Results   CYTOLOGY - NON PAP  CASE: [id number]   FINAL MICROSCOPIC DIAGNOSIS:  A. PANCREAS, HEAD, FINE NEEDLE ASPIRATION:  - Malignant cells consistent with adenocarcinoma     01/01/2022 Initial Diagnosis   Pancreatic cancer (HCC)   01/13/2022 - 04/01/2022 Chemotherapy   Patient is on Treatment Plan : PANCREAS Gemcitabine D1,15 q28d x 4 Cycles     01/26/2022 Cancer Staging   Staging form: Exocrine Pancreas, AJCC 8th Edition - Clinical: Stage IB (cT2, cN0, cM0) - Signed by Malachy Mood, MD on 01/26/2022 Total positive nodes: 0    Genetic Testing   [facility] CancerNext-Expanded Panel was Negative. Of note, a variant of uncertain significance was identified in the BLM gene (p.[id number]) and GALNT12 gene ([id number]). Report date is 01/26/2022.  The CancerNext-Expanded gene panel offered by [facility] and includes sequencing, rearrangement, and RNA analysis for the following 77 genes: AIP, ALK, APC, ATM, AXIN2, BAP1, BARD1, BLM, BMPR1A, BRCA1, BRCA2, BRIP1, CDC73, CDH1, CDK4, CDKN1B, CDKN2A, CHEK2, CTNNA1, DICER1, FANCC, FH, FLCN, GALNT12, KIF1B, LZTR1, MAX,  MEN1, MET, MLH1, MSH2, MSH3,  MSH6, MUTYH, NBN, NF1, NF2, NTHL1, PALB2, PHOX2B, PMS2, POT1, PRKAR1A, PTCH1, PTEN, RAD51C, RAD51D, RB1, RECQL, RET, SDHA, SDHAF2, SDHB, SDHC, SDHD, SMAD4, SMARCA4, SMARCB1, SMARCE1, STK11, SUFU, TMEM127, TP53, TSC1, TSC2, VHL and XRCC2 (sequencing and deletion/duplication); EGFR, EGLN1, HOXB13, KIT, MITF, PDGFRA, POLD1, and POLE (sequencing only); EPCAM and GREM1 (deletion/duplication only).    08/12/2022 Imaging    IMPRESSION: 1. Slight interval increase in size of the pancreatic head-uncinate process mass with increasing direct contact of the SMA and portal vein as discussed. Also with a replaced RIGHT hepatic artery which passes through the tumor. 2. No signs of acute inflammation currently about the pancreas. With similar appearance of ductal obstruction and peripheral atrophy of pancreas due to the tumor. 3. Post LEFT nephrectomy. 4. Sclerotic bony lesions of the LEFT ischium and LEFT iliac bone similar to prior imaging. 5. Mild hyperenhancement of LEFT prostate 14 mm with asymmetry, nonspecific. This is unchanged in this patient with history of prostate neoplasm. 6. Aortic atherosclerosis.   07/29/2023 -  Chemotherapy   Patient is on Treatment Plan : PANCREATIC Abraxane D1,8,15 + Gemcitabine D1,8,15 q28d        Discussed the use of AI scribe software for clinical note transcription with the patient, who gave verbal consent to proceed.  History of Present Illness   An 82 year old patient with a history of pancreatic cancer presents with abdominal discomfort that has been persistent for the past four days. The discomfort is diffuse and located on the sides of the abdomen. The patient also reports feeling bloated. Despite these symptoms, the patient's appetite remains unaffected. The patient also reports a slight weight loss and decreased energy levels.  The patient has a rash on the abdomen, which he believes is a recurrence of shingles. The rash has been present for about three  days and is located in the same spot where the shingles appeared. The patient has been applying calamine lotion to the rash.  The patient also reports back pain, which he manages with a lidocaine patch. The patient has a history of smoking but quit several years ago. He is currently taking oxycodone for pain management, which he cuts in half and only takes when the pain is severe. The patient also takes Miralax three times a day to manage constipation caused by the oxycodone.         All other systems were reviewed with the patient and are negative.  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)    Prostate cancer metastatic to bone (HCC)    REM sleep behavior disorder    Sleep apnea    BiPap - uses nightly    SURGICAL HISTORY: Past Surgical History:  Procedure Laterality Date   ANTERIOR APPROACH HEMI HIP ARTHROPLASTY Right 02/05/2023   Procedure: ANTERIOR APPROACH HEMI HIP ARTHROPLASTY;  Surgeon: Jodi Geralds, MD;  Location: [facility] OR;  Service: Orthopedics;  Laterality: Right;   APPENDECTOMY  1963   BIOPSY  12/25/2021   Procedure: BIOPSY;  Surgeon: Meridee Score Netty Starring., MD;  Location: [facility] ENDOSCOPY;  Service: Gastroenterology;;   BRONCHIAL BIOPSY  07/05/2023   Procedure: BRONCHIAL BIOPSIES;  Surgeon: Josephine Igo, DO;  Location: [facility] ENDOSCOPY;  Service:  Pulmonary;;   COLONOSCOPY  2010, 2015   ESOPHAGOGASTRODUODENOSCOPY N/A 04/16/2022   Procedure: ESOPHAGOGASTRODUODENOSCOPY (EGD);  Surgeon: Lemar Lofty., MD;  Location: [facility] ENDOSCOPY;  Service: Gastroenterology;  Laterality: N/A;   ESOPHAGOGASTRODUODENOSCOPY (EGD) WITH PROPOFOL N/A 12/25/2021   Procedure: ESOPHAGOGASTRODUODENOSCOPY (EGD) WITH PROPOFOL;  Surgeon: Meridee Score  Netty Starring., MD;  Location: [facility] ENDOSCOPY;  Service: Gastroenterology;  Laterality: N/A;   EUS N/A 12/25/2021   Procedure: UPPER ENDOSCOPIC ULTRASOUND (EUS) RADIAL;  Surgeon: Lemar Lofty., MD;  Location: [facility] ENDOSCOPY;  Service: Gastroenterology;  Laterality: N/A;   EUS N/A 04/16/2022   Procedure: UPPER ENDOSCOPIC ULTRASOUND (EUS) RADIAL;  Surgeon: Lemar Lofty., MD;  Location: [facility] ENDOSCOPY;  Service: Gastroenterology;  Laterality: N/A;   FIDUCIAL MARKER PLACEMENT N/A 04/16/2022   Procedure: FIDUCIAL MARKER PLACEMENT;  Surgeon: Lemar Lofty., MD;  Location: [facility] ENDOSCOPY;  Service: Gastroenterology;  Laterality: N/A;   FINE NEEDLE ASPIRATION  12/25/2021   Procedure: FINE NEEDLE ASPIRATION (FNA) LINEAR;  Surgeon: Lemar Lofty., MD;  Location: [facility] ENDOSCOPY;  Service: Gastroenterology;;   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   KYPHOPLASTY N/A 08/15/2020   Procedure: L2 compression fracture;  Surgeon: Kennedy Bucker, MD;  Location: [facility] ORS;  Service: Orthopedics;  Laterality: N/A;   KYPHOPLASTY N/A 08/29/2020   Procedure: T8 KYPHOPLASTY;  Surgeon: Kennedy Bucker, MD;  Location: [facility] ORS;  Service: Orthopedics;  Laterality: N/A;   KYPHOPLASTY N/A 11/12/2020   Procedure: L1 KYPHOPLASTY;  Surgeon: Kennedy Bucker, MD;  Location: [facility] ORS;  Service: Orthopedics;  Laterality: N/A;   PORTACATH PLACEMENT N/A 02/05/2022   Procedure: INSERTION PORT-A-CATH;  Surgeon: Fritzi Mandes, MD;  Location: [facility] ORS;  Service: General;  Laterality: N/A;    I have reviewed the social history and family history with the patient and they are unchanged from previous note.  ALLERGIES:  is allergic to influenza vaccines and influenza virus vaccine.  MEDICATIONS:  Current Outpatient Medications  Medication Sig Dispense Refill   acetaminophen (TYLENOL) 500 MG tablet Take 1 tablet (500 mg total) by mouth every 6 (six) hours as needed for moderate pain or mild pain. 30 tablet 0   ammonium lactate  (LAC-HYDRIN) 12 % lotion Apply 1 Application topically as needed.     bisacodyl (DULCOLAX) 10 MG suppository Place 1 suppository (10 mg total) rectally daily as needed for moderate constipation. 12 suppository 0   carbidopa-levodopa (SINEMET CR) 50-200 MG tablet Take 1 tablet by mouth at bedtime. 90 tablet 0   carbidopa-levodopa (SINEMET IR) 25-100 MG tablet Take 3 tablets at 7 AM, 2 tablets at 11 AM, 3 tablets at 3 PM, 2 tablets at 7 PM 900 tablet 0   ciclopirox (PENLAC) 8 % solution Apply 1 Application topically at bedtime.     clonazePAM (KLONOPIN) 0.5 MG tablet Take 1 tablet (0.5 mg total) by mouth at bedtime. 5 tablet 0   cyanocobalamin (VITAMIN B12) 1000 MCG tablet Take 1,000 mcg by mouth daily.     diltiazem (CARDIZEM) 30 MG tablet Take 1 tablet (30 mg total) by mouth 2 (two) times daily. Do not take if systolic blood pressure (top number) is less than 100, or if pulse if less than 55. 60 tablet 3   ELIQUIS 5 MG TABS tablet TAKE 1 TABLET TWICE A DAY 120 tablet 5   Glycerin-Hypromellose-PEG 400 (VISINE DRY EYE OP) Place 1 drop into both eyes 2 (two) times daily as needed (eye irritation).      ketoconazole (NIZORAL) 2 % shampoo Apply 1 application  topically  3 (three) times a week.     leuprolide, 6 Month, (ELIGARD) 45 MG injection Inject 45 mg into the skin every 6 (six) months.     omeprazole (PRILOSEC) 20 MG capsule Take 1 capsule (20 mg total) by mouth daily. 90 capsule 3   ondansetron (ZOFRAN) 4 MG tablet Take 1 tablet (4 mg total) by mouth every 6 (six) hours as needed for nausea. 20 tablet 0   oxyCODONE (OXY IR/ROXICODONE) 5 MG immediate release tablet Take 1 tablet (5 mg total) by mouth every 6 (six) hours as needed for severe pain. 30 tablet 0   PANCREAZE 37000-97300 units CPEP TAKE 2 CAPSULES WITH BREAKFAST, LUNCH, AND EVENING MEAL (WITH EACH MEAL) AND 1 CAPSULE WITH SNACK 600 capsule 5   polyethylene glycol powder (MIRALAX) 17 GM/SCOOP powder Take 17 g by mouth in the morning, at  noon, and at bedtime.     rosuvastatin (CRESTOR) 10 MG tablet TAKE 1 TABLET DAILY 90 tablet 1   tiZANidine (ZANAFLEX) 2 MG tablet Take 1 tablet (2 mg total) by mouth every 8 (eight) hours as needed for muscle spasms. 10 tablet 0   triamcinolone cream (KENALOG) 0.1 % Apply 1 application topically 2 (two) times daily. (Patient taking differently: Apply 1 application  topically 2 (two) times daily as needed (itching).) 453.6 g 1   zoledronic acid (RECLAST) 5 MG/100ML SOLN injection Inject 5 mg into the vein See admin instructions. Once a year     No current facility-administered medications for this visit.    PHYSICAL EXAMINATION: ECOG PERFORMANCE STATUS: 2 - Symptomatic, <50% confined to bed  Vitals:   07/22/23 0940  BP: 126/88  Pulse: (!) 54  Resp: 18  Temp: 97.8 F (36.6 C)  SpO2: 97%   Wt Readings from Last 3 Encounters:  07/22/23 149 lb 11.2 oz (67.9 kg)  07/13/23 147 lb 6.4 oz (66.9 kg)  07/05/23 152 lb (68.9 kg)     GENERAL:alert, no distress and comfortable SKIN: skin color, texture, turgor are normal, no rashes or significant lesions EYES: normal, Conjunctiva are pink and non-injected, sclera clear NECK: supple, thyroid normal size, non-tender, without nodularity LYMPH:  no palpable lymphadenopathy in the cervical, axillary  LUNGS: clear to auscultation and percussion with normal breathing effort HEART: regular rate & rhythm and no murmurs and no lower extremity edema Musculoskeletal:no cyanosis of digits and no clubbing  NEURO: alert & oriented x 3 with fluent speech, no focal motor/sensory deficits ABDOMEN: No tenderness upon palpation in the lower part of the abdomen. Slight tenderness in the middle and upper parts of the abdomen upon deep palpation.  LABORATORY DATA:  I have reviewed the data as listed    Latest Ref Rng & Units 07/22/2023    8:40 AM 07/05/2023    7:01 AM 05/20/2023    3:03 PM  CBC  WBC 4.0 - 10.5 K/uL 5.3  4.5  6.1   Hemoglobin 13.0 - 17.0 g/dL  16.1  09.6  04.5   Hematocrit 39.0 - 52.0 % 40.2  41.2  39.1   Platelets 150 - 400 K/uL 178  172  199         Latest Ref Rng & Units 07/22/2023    8:40 AM 07/05/2023    7:01 AM 05/20/2023    3:03 PM  CMP  Glucose 70 - 99 mg/dL 95  95  409   BUN 8 - 23 mg/dL 18  21  25    Creatinine 0.61 - 1.24 mg/dL 8.11  0.92  1.18   Sodium 135 - 145 mmol/L 143  139  138   Potassium 3.5 - 5.1 mmol/L 3.9  3.9  4.9   Chloride 98 - 111 mmol/L 107  106  108   CO2 22 - 32 mmol/L 31  23  26    Calcium 8.9 - 10.3 mg/dL 9.8  9.4  9.9   Total Protein 6.5 - 8.1 g/dL 7.1  6.9  7.5   Total Bilirubin <1.2 mg/dL 1.0  1.1  0.8   Alkaline Phos 38 - 126 U/L 57  52  69   AST 15 - 41 U/L 25  33  25   ALT 0 - 44 U/L <5  <5  7       RADIOGRAPHIC STUDIES: I have personally reviewed the radiological images as listed and agreed with the findings in the report. No results found.    Orders Placed This Encounter  Procedures   IR IMAGING GUIDED PORT INSERTION    Standing Status:   Future    Standing Expiration Date:   07/21/2024    Order Specific Question:   Reason for Exam (SYMPTOM  OR DIAGNOSIS REQUIRED)    Answer:   CHEMO    Order Specific Question:   Preferred Imaging Location?    Answer:   [facility]   CT ABDOMEN PELVIS W CONTRAST    Standing Status:   Future    Standing Expiration Date:   07/21/2024    Order Specific Question:   If indicated for the ordered procedure, I authorize the administration of contrast media per Radiology protocol    Answer:   Yes    Order Specific Question:   Does the patient have a contrast media/X-ray dye allergy?    Answer:   No    Order Specific Question:   Preferred imaging location?    Answer:   [facility]    Order Specific Question:   If indicated for the ordered procedure, I authorize the administration of oral contrast media per Radiology protocol    Answer:   Yes   CBC with Differential (Cancer Center Only)    Standing Status:   Future     Standing Expiration Date:   07/28/2024   CMP (Cancer Center only)    Standing Status:   Future    Standing Expiration Date:   07/28/2024   CBC with Differential (Cancer Center Only)    Standing Status:   Future    Standing Expiration Date:   08/11/2024   CMP (Cancer Center only)    Standing Status:   Future    Standing Expiration Date:   08/11/2024   All questions were answered. The patient knows to call the clinic with any problems, questions or concerns. No barriers to learning was detected. The total time spent in the appointment was 40 minutes.     Malachy Mood, MD 07/22/2023

## 2023-07-22 NOTE — Assessment & Plan Note (Signed)
diagnosed in 09/2021, deemed poor surgical candidate due to his age and medical co-morbidities -S/p single agent gemcitabine 01/13/22 - 04/01/22 and 5 fx SBRT [name]) 05/04/22 - 05/14/22. CA 19-9 decreased after treatment -Surveillance CT 11/09/2022 showed slightly decreased size of pancreatic head mass, no evidence of metastatic disease.  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  He is taking ibuprofen as needed.  No other clinical concern for cancer progression. -Lab reviewed, overall stable, tumor marker still pending. -He was admitted for right femoral neck fracture  on 02/03/23 after a fall and had surgery  -CT scan from May 17, 2023 showed multiple lung nodules, with dominant 1.5 cm nodule in the right middle lobe, highly concerning for metastasis.  His lung lesions are positive on PET scan. -Bronchoscopy and the biopsy attempted, which showed atypical cells.

## 2023-07-23 ENCOUNTER — Other Ambulatory Visit: Payer: Self-pay

## 2023-07-23 LAB — PROSTATE-SPECIFIC AG, SERUM (LABCORP): Prostate Specific Ag, Serum: <0.1 ng/mL (ref 0.0–4.0)

## 2023-07-23 LAB — CANCER ANTIGEN 19-9: CA 19-9: 704 U/mL — ABNORMAL HIGH (ref 0–35)

## 2023-07-26 ENCOUNTER — Telehealth: Payer: Self-pay | Admitting: Hematology

## 2023-07-26 NOTE — Telephone Encounter (Signed)
 spoke with patient confirming upcoming appointment

## 2023-07-27 ENCOUNTER — Other Ambulatory Visit: Payer: Self-pay | Admitting: Urology

## 2023-07-27 ENCOUNTER — Other Ambulatory Visit: Payer: Self-pay | Admitting: Radiology

## 2023-07-27 DIAGNOSIS — M62551 Muscle wasting and atrophy, not elsewhere classified, right thigh: Secondary | ICD-10-CM | POA: Diagnosis not present

## 2023-07-27 DIAGNOSIS — Z96641 Presence of right artificial hip joint: Secondary | ICD-10-CM | POA: Diagnosis not present

## 2023-07-27 DIAGNOSIS — R2681 Unsteadiness on feet: Secondary | ICD-10-CM | POA: Diagnosis not present

## 2023-07-27 DIAGNOSIS — S72001S Fracture of unspecified part of neck of right femur, sequela: Secondary | ICD-10-CM | POA: Diagnosis not present

## 2023-07-27 DIAGNOSIS — R2689 Other abnormalities of gait and mobility: Secondary | ICD-10-CM | POA: Diagnosis not present

## 2023-07-27 DIAGNOSIS — M25551 Pain in right hip: Secondary | ICD-10-CM | POA: Diagnosis not present

## 2023-07-27 NOTE — H&P (Signed)
Chief Complaint: Patient was seen in consultation today for Port-A-Cath placement for chemotherapy administration, at the request of Feng,Yan  Supervising Physician: Malachy Moan  Patient Status: [facility] - Out-pt  History of Present Illness: [name] [name] is a 82 y.o. male   FULL Code status per patient and chart.  82 year old with pancreatic cancer diagnosed in 09/2021.  Recent PET scan showed a suspicious lung nodule, likely metastasis. Biopsy revealed atypical cells, highly suspicious for cancer. Plan for chemotherapy with gemcitabine and Abraxane. Patient prefers combination therapy for better efficacy. Patient opted for port placement for therapies.   IR requested for Port-A-Cath placement. Patient is scheduled for same in IR today.   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)    Prostate cancer metastatic to bone (HCC)    REM sleep behavior disorder    Sleep apnea    BiPap - uses nightly    Past Surgical History:  Procedure Laterality Date   ANTERIOR APPROACH HEMI HIP ARTHROPLASTY Right 02/05/2023   Procedure: ANTERIOR APPROACH HEMI HIP ARTHROPLASTY;  Surgeon: Jodi Geralds, MD;  Location: [facility] OR;  Service: Orthopedics;  Laterality: Right;   APPENDECTOMY  1963   BIOPSY  12/25/2021   Procedure: BIOPSY;  Surgeon: Meridee Score Netty Starring., MD;  Location: [facility] ENDOSCOPY;  Service: Gastroenterology;;   BRONCHIAL BIOPSY  07/05/2023   Procedure: BRONCHIAL BIOPSIES;  Surgeon: Josephine Igo, DO;  Location: [facility] ENDOSCOPY;  Service: Pulmonary;;   COLONOSCOPY  2010, 2015   ESOPHAGOGASTRODUODENOSCOPY N/A 04/16/2022   Procedure: ESOPHAGOGASTRODUODENOSCOPY (EGD);  Surgeon: Lemar Lofty., MD;  Location: [facility] ENDOSCOPY;  Service: Gastroenterology;   Laterality: N/A;   ESOPHAGOGASTRODUODENOSCOPY (EGD) WITH PROPOFOL N/A 12/25/2021   Procedure: ESOPHAGOGASTRODUODENOSCOPY (EGD) WITH PROPOFOL;  Surgeon: Meridee Score Netty Starring., MD;  Location: [facility] ENDOSCOPY;  Service: Gastroenterology;  Laterality: N/A;   EUS N/A 12/25/2021   Procedure: UPPER ENDOSCOPIC ULTRASOUND (EUS) RADIAL;  Surgeon: Lemar Lofty., MD;  Location: [facility] ENDOSCOPY;  Service: Gastroenterology;  Laterality: N/A;   EUS N/A 04/16/2022   Procedure: UPPER ENDOSCOPIC ULTRASOUND (EUS) RADIAL;  Surgeon: Lemar Lofty., MD;  Location: [facility] ENDOSCOPY;  Service: Gastroenterology;  Laterality: N/A;   FIDUCIAL MARKER PLACEMENT N/A 04/16/2022   Procedure: FIDUCIAL MARKER PLACEMENT;  Surgeon: Lemar Lofty., MD;  Location: [facility] ENDOSCOPY;  Service: Gastroenterology;  Laterality: N/A;   FINE NEEDLE ASPIRATION  12/25/2021   Procedure: FINE NEEDLE ASPIRATION (FNA) LINEAR;  Surgeon: Lemar Lofty., MD;  Location: [facility] ENDOSCOPY;  Service: Gastroenterology;;   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   KYPHOPLASTY N/A 08/15/2020   Procedure: L2 compression fracture;  Surgeon: Kennedy Bucker, MD;  Location: [facility] ORS;  Service: Orthopedics;  Laterality: N/A;   KYPHOPLASTY N/A 08/29/2020   Procedure: T8 KYPHOPLASTY;  Surgeon: Kennedy Bucker, MD;  Location: [facility] ORS;  Service: Orthopedics;  Laterality: N/A;   KYPHOPLASTY N/A 11/12/2020   Procedure: L1 KYPHOPLASTY;  Surgeon: Kennedy Bucker, MD;  Location: [facility] ORS;  Service: Orthopedics;  Laterality: N/A;   PORTACATH PLACEMENT N/A 02/05/2022   Procedure: INSERTION PORT-A-CATH;  Surgeon: Fritzi Mandes, MD;  Location: [facility] ORS;  Service: General;  Laterality: N/A;    Allergies: Influenza vaccines and Influenza virus vaccine  Medications: Prior to Admission medications   Medication Sig  Start Date End Date Taking? Authorizing Provider  acetaminophen (TYLENOL) 500 MG tablet Take 1 tablet (500 mg total) by mouth every 6 (six) hours as needed for  moderate pain or mild pain. 04/18/22   Mansouraty, Netty Starring., MD  ammonium lactate (LAC-HYDRIN) 12 % lotion Apply 1 Application topically as needed. 06/22/23   [provider]  bisacodyl (DULCOLAX) 10 MG suppository Place 1 suppository (10 mg total) rectally daily as needed for moderate constipation. 02/09/23   Sheikh, Kateri Mc Latif, DO  carbidopa-levodopa (SINEMET CR) 50-200 MG tablet Take 1 tablet by mouth at bedtime. 07/13/23   Tat, Octaviano Batty, DO  carbidopa-levodopa (SINEMET IR) 25-100 MG tablet Take 3 tablets at 7 AM, 2 tablets at 11 AM, 3 tablets at 3 PM, 2 tablets at 7 PM 04/28/23   Tat, Rebecca S, DO  ciclopirox (PENLAC) 8 % solution Apply 1 Application topically at bedtime.  09/29/24  [provider]  clonazePAM (KLONOPIN) 0.5 MG tablet Take 1 tablet (0.5 mg total) by mouth at bedtime. 02/09/23   Marguerita Merles Latif, DO  cyanocobalamin (VITAMIN B12) 1000 MCG tablet Take 1,000 mcg by mouth daily.    [provider]  diltiazem (CARDIZEM) 30 MG tablet Take 1 tablet (30 mg total) by mouth 2 (two) times daily. Do not take if systolic blood pressure (top number) is less than 100, or if pulse if less than 55. 06/16/23   Tolia, Sunit, DO  ELIQUIS 5 MG TABS tablet TAKE 1 TABLET TWICE A DAY 03/12/23   Tolia, Sunit, DO  Glycerin-Hypromellose-PEG 400 (VISINE DRY EYE OP) Place 1 drop into both eyes 2 (two) times daily as needed (eye irritation).     [provider]  ketoconazole (NIZORAL) 2 % shampoo Apply 1 application  topically 3 (three) times a week. 02/15/19   [provider]  leuprolide, 6 Month, (ELIGARD) 45 MG injection Inject 45 mg into the skin every 6 (six) months.    [provider]  omeprazole (PRILOSEC) 20 MG capsule Take 1 capsule (20 mg total) by mouth daily. 04/20/23   Malachy Mood, MD  ondansetron (ZOFRAN) 4 MG tablet Take 1 tablet (4 mg total) by mouth every 6 (six) hours as needed for nausea. 02/09/23   Marguerita Merles Latif, DO  oxyCODONE (OXY  IR/ROXICODONE) 5 MG immediate release tablet Take 1 tablet (5 mg total) by mouth every 6 (six) hours as needed for severe pain. 05/20/23   Malachy Mood, MD  PANCREAZE [phone] units CPEP TAKE 2 CAPSULES WITH BREAKFAST, LUNCH, AND EVENING MEAL (WITH EACH MEAL) AND 1 CAPSULE WITH SNACK 03/12/23   Arnaldo Natal, NP  polyethylene glycol powder (MIRALAX) 17 GM/SCOOP powder Take 17 g by mouth in the morning, at noon, and at bedtime.    [provider]  rosuvastatin (CRESTOR) 10 MG tablet TAKE 1 TABLET DAILY 07/08/23   Tolia, Sunit, DO  tiZANidine (ZANAFLEX) 2 MG tablet Take 1 tablet (2 mg total) by mouth every 8 (eight) hours as needed for muscle spasms. 02/09/23   Marguerita Merles Latif, DO  triamcinolone cream (KENALOG) 0.1 % Apply 1 application topically 2 (two) times daily. Patient taking differently: Apply 1 application  topically 2 (two) times daily as needed (itching). 01/16/21   Duanne Limerick, MD  zoledronic acid (RECLAST) 5 MG/100ML SOLN injection Inject 5 mg into the vein See admin instructions. Once a year    [provider]     Family History  Problem Relation Age of Onset   Heart  disease Maternal Grandfather    Diabetes Paternal Grandfather    Breast cancer Daughter    Colon cancer Neg Hx    Esophageal cancer Neg Hx    Stomach cancer Neg Hx     Social History   Socioeconomic History   Marital status: Widowed    Spouse name: Genette   Number of children: 5   Years of education: Not on file   Highest education level: Not on file  Occupational History   Occupation: retired    Comment: Art therapist  Tobacco Use   Smoking status: Former    Types: Cigars    Quit date: 09/07/2010    Years since quitting: 12.8   Smokeless tobacco: Never  Vaping Use   Vaping status: Never Used  Substance and Sexual Activity   Alcohol use: Not Currently    Alcohol/week: 4.0 standard drinks of alcohol    Types: 4 Standard drinks or equivalent per week    Comment: 2  cans beer/week and occas. 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    Right handed   Two story home   Social Determinants of Health   Financial Resource Strain: Not on file  Food Insecurity: Not on file  Transportation Needs: Not on file  Physical Activity: Not on file  Stress: Not on file  Social Connections: Not on file   Review of Systems: A 12 point ROS discussed and pertinent positives are indicated in the HPI above.  All other systems are negative.  Review of Systems  Vital Signs: There were no vitals taken for this visit.  Advance Care Plan: {Advance Care ZOXW:[id number]}    Physical Exam  Imaging: CT Super D Chest Wo Contrast  Result Date: 07/05/2023 CLINICAL DATA:  Pancreatic cancer, hypermetabolic pulmonary nodules. * Tracking Code: BO * EXAM: CT CHEST WITHOUT CONTRAST TECHNIQUE: Multidetector CT imaging of the chest was performed using thin slice collimation for electromagnetic bronchoscopy planning purposes, without intravenous contrast. RADIATION DOSE REDUCTION: This exam was performed according to the departmental dose-optimization program which includes automated exposure control, adjustment of the mA and/or kV according to patient size and/or use of iterative reconstruction technique. COMPARISON:  05/17/2023 FINDINGS: Cardiovascular: Biapical pleuroparenchymal scarring. Coronary, aortic arch, and branch vessel atherosclerotic vascular disease. Mediastinum/Nodes: Unremarkable Lungs/Pleura: Reduce solidity of the right middle lobe nodule which now appears to have a branching morphology on images 102-107 of series 5, with aerated portions in between the branches as shown for example on image 106 of series 5. This lesion was formerly completely solid. The clustered nodules measure 1.5 by  1.1 cm on image 105 series 5, the previously solid lesion in this vicinity previously measured 1.5 by 1.6 cm. Left lower lobe pulmonary nodule with somewhat hazy margins, 1.1 by 0.8 cm on image 94 series 5, previously the solid component measured 1.1 by 0.6 cm. Subpleural right lower lobe nodule on image 102 of series 5 measures 0.8 by 0.9 cm, formerly 0.7 by 0.9 cm. Subpleural right lower lobe nodule on image 102 of series 5 measures 0.5 by 0.4 cm, formerly the same. Subpleural nodule in the right costophrenic sulcus medially on image 138 series 5 measures 0.8 by 0.4 cm, stable. Subpleural nodule in the left lower lobe on image 122 series 5 measures 0.7 by 0.4  cm, formerly 0.6 by 0.4 cm by my measurements. A left lower lobe nodule on image 114 of series 5 measures 0.6 by 0.3 cm, formerly 0.5 by 0.3 cm. Upper Abdomen: High density in the gallbladder compatible with gallstones. Stable probable cyst in the liver on image 54 series 2. Hypodensity in the pancreatic tail and body likely from distended dorsal pancreatic duct. Musculoskeletal: Stable appearance of compression fractures at T7, T8, T9, L1, and L2 with vertebral augmentations at T8, L1, and L2. Healing fracture of the left anterior fifth rib. IMPRESSION: 1. Reduced solidity of the right middle lobe nodule which now appears to have a branching morphology, with aerated portions in between the branches. This lesion was formerly completely solid. 2. Other pulmonary nodules are stable or nearly stable. 3. Coronary, aortic arch, and branch vessel atherosclerotic vascular disease. 4. Cholelithiasis. 5. Stable appearance of compression fractures at T7, T8, T9, L1, and L2 with vertebral augmentations at T8, L1, and L2. 6. Healing fracture of the left anterior fifth rib. 7. Aortic atherosclerosis. Aortic Atherosclerosis (ICD10-I70.0). Electronically Signed   By: Gaylyn Rong M.D.   On: 07/05/2023 13:12   DG Chest Port 1 View  Result Date: 07/05/2023 CLINICAL  DATA:  Status post bronchoscopy EXAM: PORTABLE CHEST 1 VIEW COMPARISON:  Chest radiograph dated 05/08/2023 FINDINGS: Low lung volumes with bronchovascular crowding. Bilateral interstitial and bibasilar patchy opacities. No pleural effusion or pneumothorax. Enlarged cardiomediastinal silhouette is likely projectional. No acute osseous abnormality. IMPRESSION: 1. Low lung volumes with bronchovascular crowding. Bilateral interstitial and bibasilar patchy opacities, may reflect atelectasis, pulmonary edema, or small foci of hemorrhage. 2. No pneumothorax. Electronically Signed   By: Agustin Cree M.D.   On: 07/05/2023 11:41   DG C-ARM BRONCHOSCOPY  Result Date: 07/05/2023 C-ARM BRONCHOSCOPY: Fluoroscopy was utilized by the requesting physician.  No radiographic interpretation.    Labs:  CBC: Recent Labs    05/17/23 1106 05/20/23 1503 07/05/23 0701 07/22/23 0840  WBC 4.8 6.1 4.5 5.3  HGB 13.5 12.3* 12.9* 13.0  HCT 41.2 39.1 41.2 40.2  PLT 213 199 172 178    COAGS: Recent Labs    02/03/23 1519  INR 1.2    BMP: Recent Labs    05/17/23 1106 05/20/23 1503 07/05/23 0701 07/22/23 0840  NA 143 138 139 143  K 4.3 4.9 3.9 3.9  CL 108 108 106 107  CO2 29 26 23 31   GLUCOSE 86 246* 95 95  BUN 22 25* 21 18  CALCIUM 9.8 9.9 9.4 9.8  CREATININE 1.19 1.18 0.92 0.96  GFRNONAA >60 >60 >60 >60    LIVER FUNCTION TESTS: Recent Labs    05/17/23 1106 05/20/23 1503 07/05/23 0701 07/22/23 0840  BILITOT 0.7 0.8 1.1 1.0  AST 24 25 33 25  ALT <5 7 <5 <5  ALKPHOS 67 69 52 57  PROT 7.1 7.5 6.9 7.1  ALBUMIN 4.1 4.3 3.7 4.2    TUMOR MARKERS: No results for input(s): "AFPTM", "CEA", "CA199", "CHROMGRNA" in the last 8760 hours.  Assessment and Plan:  82 year old with pancreatic cancer diagnosed in 09/2021.  Recent PET scan showed a suspicious lung nodule, likely metastasis. Biopsy revealed atypical cells, highly suspicious for cancer.   Plan for chemotherapy with gemcitabine and Abraxane.   Patient opted for port placement for therapies.   Patient presents for scheduled Port-A-Cath placement in IR today. Risks and benefits of image guided port-a-catheter placement was discussed with the patient including, but not limited to bleeding, infection, pneumothorax, or fibrin sheath  development and need for additional procedures.  All of the patient's questions were answered, patient is agreeable to proceed. Consent signed and in chart.   Thank you for this interesting consult.  I greatly enjoyed meeting CASIUS MELCHIOR and look forward to participating in their care.  A copy of this report was sent to the requesting provider on this date.  Electronically Signed: Sable Feil, PA-C 07/27/2023, 1:19 PM   I spent a total of  30 Minutes   in face to face in clinical consultation, greater than 50% of which was counseling/coordinating care for Port-A-Cath placement.

## 2023-07-28 ENCOUNTER — Other Ambulatory Visit: Payer: Self-pay

## 2023-07-28 ENCOUNTER — Ambulatory Visit (HOSPITAL_COMMUNITY)
Admission: RE | Admit: 2023-07-28 | Discharge: 2023-07-28 | Disposition: A | Discharge status: Home / Self Care | Payer: Medicare Other | Source: Ambulatory Visit | Attending: Hematology | Admitting: Hematology

## 2023-07-28 ENCOUNTER — Encounter: Payer: Self-pay | Admitting: Hematology

## 2023-07-28 ENCOUNTER — Encounter (HOSPITAL_COMMUNITY): Payer: Self-pay

## 2023-07-28 ENCOUNTER — Other Ambulatory Visit: Payer: Self-pay | Admitting: Hematology

## 2023-07-28 ENCOUNTER — Ambulatory Visit (HOSPITAL_COMMUNITY)
Admission: RE | Admit: 2023-07-28 | Discharge: 2023-07-28 | Disposition: A | Discharge status: Home / Self Care | Payer: Medicare Other | Source: Ambulatory Visit | Attending: Hematology

## 2023-07-28 DIAGNOSIS — C250 Malignant neoplasm of head of pancreas: Secondary | ICD-10-CM

## 2023-07-28 DIAGNOSIS — C259 Malignant neoplasm of pancreas, unspecified: Secondary | ICD-10-CM | POA: Insufficient documentation

## 2023-07-28 HISTORY — PX: IR IMAGING GUIDED PORT INSERTION: IMG5740

## 2023-07-28 MED ORDER — FENTANYL CITRATE (PF) 100 MCG/2ML IJ SOLN
INTRAMUSCULAR | Status: AC | PRN
  Administered 2023-07-28: 25 ug via INTRAVENOUS

## 2023-07-28 MED ORDER — FENTANYL CITRATE (PF) 100 MCG/2ML IJ SOLN
INTRAMUSCULAR | Status: AC | PRN
Start: 2023-07-28 — End: 2023-07-28
  Administered 2023-07-28: 25 ug via INTRAVENOUS

## 2023-07-28 MED ORDER — MIDAZOLAM HCL 2 MG/2ML IJ SOLN
INTRAMUSCULAR | Status: AC | PRN
  Administered 2023-07-28: .5 mg via INTRAVENOUS

## 2023-07-28 MED ORDER — HEPARIN SOD (PORK) LOCK FLUSH 100 UNIT/ML IV SOLN
INTRAVENOUS | Status: AC
  Filled 2023-07-28: qty 5

## 2023-07-28 MED ORDER — HEPARIN SOD (PORK) LOCK FLUSH 100 UNIT/ML IV SOLN
500.0000 [IU] | Freq: Once | INTRAVENOUS | Status: AC
  Administered 2023-07-28: 500 [IU] via INTRAVENOUS

## 2023-07-28 MED ORDER — MIDAZOLAM HCL 2 MG/2ML IJ SOLN
INTRAMUSCULAR | Status: AC | PRN
  Administered 2023-07-28: 1 mg via INTRAVENOUS

## 2023-07-28 MED ORDER — SODIUM CHLORIDE 0.9% FLUSH
10.0000 mL | Freq: Two times a day (BID) | INTRAVENOUS | Status: DC
  Administered 2023-07-28: 10 mL via INTRAVENOUS

## 2023-07-28 MED ORDER — FENTANYL CITRATE (PF) 100 MCG/2ML IJ SOLN
INTRAMUSCULAR | Status: AC
  Filled 2023-07-28: qty 2

## 2023-07-28 MED ORDER — LIDOCAINE-EPINEPHRINE 1 %-1:100000 IJ SOLN
INTRAMUSCULAR | Status: AC
  Filled 2023-07-28: qty 1

## 2023-07-28 MED ORDER — MIDAZOLAM HCL 2 MG/2ML IJ SOLN
INTRAMUSCULAR | Status: AC
  Filled 2023-07-28: qty 2

## 2023-07-28 MED ORDER — SODIUM CHLORIDE 0.9 % IV SOLN: INTRAVENOUS | Status: DC

## 2023-07-28 MED ORDER — LIDOCAINE-EPINEPHRINE 1 %-1:100000 IJ SOLN
20.0000 mL | Freq: Once | INTRAMUSCULAR | Status: AC
  Administered 2023-07-28: 15 mL via INTRADERMAL

## 2023-07-28 MED ORDER — OXYCODONE HCL 5 MG PO TABS: 5.0000 mg | ORAL_TABLET | Freq: Four times a day (QID) | ORAL | 0 refills | Status: DC | PRN

## 2023-07-28 NOTE — Assessment & Plan Note (Signed)
diagnosed in 09/2021, deemed poor surgical candidate due to his age and medical co-morbidities -S/p single agent gemcitabine 01/13/22 - 04/01/22 and 5 fx SBRT [name]) 05/04/22 - 05/14/22. CA 19-9 decreased after treatment -Surveillance CT 11/09/2022 showed slightly decreased size of pancreatic head mass, no evidence of metastatic disease.  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  He is taking ibuprofen as needed.  No other clinical concern for cancer progression. -Lab reviewed, overall stable, tumor marker still pending. -He was admitted for right femoral neck fracture  on 02/03/23 after a fall and had surgery  -CT scan from May 17, 2023 showed multiple lung nodules, with dominant 1.5 cm nodule in the right middle lobe, highly concerning for metastasis.  His lung lesions are positive on PET scan. -Bronchoscopy and biopsy attempted, which showed atypical cells. -He restarted chemo gemcitabine and abraxane on 07/29/2023

## 2023-07-28 NOTE — Procedures (Signed)
Interventional Radiology Procedure Note  Procedure: Placement of a right internal jugular power injectable Ang[employer] SmartPort.  Tip is positioned in the mid SVC and catheter is ready for immediate use.   Complications: No immediate  Recommendations:  - Ok to shower tomorrow - Do not submerge for 7 days - Routine line care    Signed,  Sterling Big, MD

## 2023-07-28 NOTE — Telephone Encounter (Signed)
He has appointment with Dr. Mosetta Putt tomorrow. Does he have enough until then?

## 2023-07-28 NOTE — Discharge Instructions (Addendum)
Discharge Instructions:   Please call Interventional Radiology clinic [phone] with any questions or concerns.  You may remove your dressings and shower tomorrow.  Do not use EMLA / Lidocaine cream for 2 weeks post Port Insertion this will remove the surgical glue.  Implanted Port Insertion, Care After The following information offers guidance on how to care for yourself after your procedure. Your health care provider may also give you more specific instructions. If you have problems or questions, contact your health care provider. What can I expect after the procedure? After the procedure, it is common to have: Discomfort at the port insertion site. Bruising on the skin over the port. This should improve over 3-4 days. Follow these instructions at home: [facility] care After your port is placed, you will get a manufacturer's information card. The card has information about your port. Keep this card with you at all times. Take care of the port as told by your health care provider. Ask your health care provider if you or a family member can get training for taking care of the port at home. A home health care nurse will be be available to help care for the port. Make sure to remember what type of port you have. Incision care     Follow instructions from your health care provider about how to take care of your port insertion site. Make sure you: Wash your hands with soap and water for at least 20 seconds before and after you change your bandage (dressing). If soap and water are not available, use hand sanitizer. Change your dressing as told by your health care provider. Leave stitches (sutures), skin glue, or adhesive strips in place. These skin closures may need to stay in place for 2 weeks or longer. If adhesive strip edges start to loosen and curl up, you may trim the loose edges. Do not remove adhesive strips completely unless your health care provider tells you to do that. Check your port  insertion site every day for signs of infection. Check for: Redness, swelling, or pain. Fluid or blood. Warmth. Pus or a bad smell. Activity Return to your normal activities as told by your health care provider. Ask your health care provider what activities are safe for you. You may have to avoid lifting. Ask your health care provider how much you can safely lift. General instructions Take over-the-counter and prescription medicines only as told by your health care provider. Do not take baths, swim, or use a hot tub until your health care provider approves. Ask your health care provider if you may take showers. You may only be allowed to take sponge baths. If you were given a sedative during the procedure, it can affect you for several hours. Do not drive or operate machinery until your health care provider says that it is safe. Wear a medical alert bracelet in case of an emergency. This will tell any health care providers that you have a port. Keep all follow-up visits. This is important. Contact a health care provider if: You cannot flush your port with saline as directed, or you cannot draw blood from the port. You have a fever or chills. You have redness, swelling, or pain around your port insertion site. You have fluid or blood coming from your port insertion site. Your port insertion site feels warm to the touch. You have pus or a bad smell coming from the port insertion site. Get help right away if: You have chest pain or shortness of breath.  You have bleeding from your port that you cannot control. These symptoms may be an emergency. Get help right away. Call 911. Do not wait to see if the symptoms will go away. Do not drive yourself to the hospital. Summary Take care of the port as told by your health care provider. Keep the manufacturer's information card with you at all times. Change your dressing as told by your health care provider. Contact a health care provider if you  have a fever or chills or if you have redness, swelling, or pain around your port insertion site. Keep all follow-up visits. This information is not intended to replace advice given to you by your health care provider. Make sure you discuss any questions you have with your health care provider. Document Revised: 02/25/2021 Document Reviewed: 02/25/2021 Elsevier Patient Education  2023 Else[employer] Inc.  Moderate Conscious Sedation, Adult, Care After This sheet gives you information about how to care for yourself after your procedure. Your health care provider may also give you more specific instructions. If you have problems or questions, contact your health care provider. What can I expect after the procedure? After the procedure, it is common to have: Sleepiness for several hours. Impaired judgment for several hours. Difficulty with balance. Vomiting if you eat too soon. Follow these instructions at home: For the time period you were told by your health care provider: Rest. Do not participate in activities where you could fall or become injured. Do not drive or use machinery. Do not drink alcohol. Do not take sleeping pills or medicines that cause drowsiness. Do not make important decisions or sign legal documents. Do not take care of children on your own. Eating and drinking  Follow the diet recommended by your health care provider. Drink enough fluid to keep your urine pale yellow. If you vomit: Drink water, juice, or soup when you can drink without vomiting. Make sure you have little or no nausea before eating solid foods. General instructions Take over-the-counter and prescription medicines only as told by your health care provider. Have a responsible adult stay with you for the time you are told. It is important to have someone help care for you until you are awake and alert. Do not smoke. Keep all follow-up visits as told by your health care provider. This is  important. Contact a health care provider if: You are still sleepy or having trouble with balance after 24 hours. You feel light-headed. You keep feeling nauseous or you keep vomiting. You develop a rash. You have a fever. You have redness or swelling around the IV site. Get help right away if: You have trouble breathing. You have new-onset confusion at home. Summary After the procedure, it is common to feel sleepy, have impaired judgment, or feel nauseous if you eat too soon. Rest after you get home. Know the things you should not do after the procedure. Follow the diet recommended by your health care provider and drink enough fluid to keep your urine pale yellow. Get help right away if you have trouble breathing or new-onset confusion at home. This information is not intended to replace advice given to you by your health care provider. Make sure you discuss any questions you have with your health care provider. Document Revised: 12/22/2019 Document Reviewed: 07/20/2019 Elsevier Patient Education  2023 [employer].

## 2023-07-28 NOTE — Progress Notes (Signed)
1530 Ice bag given to use as needed for comfort right upper neck and right upper chest.

## 2023-07-29 ENCOUNTER — Encounter: Payer: Self-pay | Admitting: Internal Medicine

## 2023-07-29 ENCOUNTER — Inpatient Hospital Stay: Payer: Medicare Other

## 2023-07-29 ENCOUNTER — Inpatient Hospital Stay: Payer: Medicare Other | Admitting: Hematology

## 2023-07-29 VITALS — BP 121/84 | HR 81 | Resp 14

## 2023-07-29 VITALS — BP 101/72 | HR 104 | Temp 98.1°F | Resp 15 | Ht 65.0 in | Wt 149.5 lb

## 2023-07-29 DIAGNOSIS — Z0389 Encounter for observation for other suspected diseases and conditions ruled out: Secondary | ICD-10-CM | POA: Diagnosis not present

## 2023-07-29 DIAGNOSIS — Z923 Personal history of irradiation: Secondary | ICD-10-CM | POA: Diagnosis not present

## 2023-07-29 DIAGNOSIS — G8929 Other chronic pain: Secondary | ICD-10-CM | POA: Diagnosis not present

## 2023-07-29 DIAGNOSIS — Z7901 Long term (current) use of anticoagulants: Secondary | ICD-10-CM | POA: Diagnosis not present

## 2023-07-29 DIAGNOSIS — R001 Bradycardia, unspecified: Secondary | ICD-10-CM | POA: Diagnosis not present

## 2023-07-29 DIAGNOSIS — A419 Sepsis, unspecified organism: Secondary | ICD-10-CM | POA: Diagnosis not present

## 2023-07-29 DIAGNOSIS — N400 Enlarged prostate without lower urinary tract symptoms: Secondary | ICD-10-CM | POA: Diagnosis not present

## 2023-07-29 DIAGNOSIS — R21 Rash and other nonspecific skin eruption: Secondary | ICD-10-CM | POA: Diagnosis not present

## 2023-07-29 DIAGNOSIS — Z9221 Personal history of antineoplastic chemotherapy: Secondary | ICD-10-CM | POA: Diagnosis not present

## 2023-07-29 DIAGNOSIS — C259 Malignant neoplasm of pancreas, unspecified: Secondary | ICD-10-CM | POA: Diagnosis not present

## 2023-07-29 DIAGNOSIS — T80219A Unspecified infection due to central venous catheter, initial encounter: Secondary | ICD-10-CM | POA: Diagnosis not present

## 2023-07-29 DIAGNOSIS — C250 Malignant neoplasm of head of pancreas: Secondary | ICD-10-CM

## 2023-07-29 DIAGNOSIS — C61 Malignant neoplasm of prostate: Secondary | ICD-10-CM | POA: Diagnosis not present

## 2023-07-29 DIAGNOSIS — Z887 Allergy status to serum and vaccine status: Secondary | ICD-10-CM | POA: Diagnosis not present

## 2023-07-29 DIAGNOSIS — G4733 Obstructive sleep apnea (adult) (pediatric): Secondary | ICD-10-CM | POA: Diagnosis not present

## 2023-07-29 DIAGNOSIS — Z8546 Personal history of malignant neoplasm of prostate: Secondary | ICD-10-CM | POA: Diagnosis not present

## 2023-07-29 DIAGNOSIS — I959 Hypotension, unspecified: Secondary | ICD-10-CM | POA: Diagnosis not present

## 2023-07-29 DIAGNOSIS — C7951 Secondary malignant neoplasm of bone: Secondary | ICD-10-CM | POA: Diagnosis not present

## 2023-07-29 DIAGNOSIS — Y848 Other medical procedures as the cause of abnormal reaction of the patient, or of later complication, without mention of misadventure at the time of the procedure: Secondary | ICD-10-CM | POA: Diagnosis present

## 2023-07-29 DIAGNOSIS — Z87891 Personal history of nicotine dependence: Secondary | ICD-10-CM | POA: Diagnosis not present

## 2023-07-29 DIAGNOSIS — L0390 Cellulitis, unspecified: Secondary | ICD-10-CM | POA: Diagnosis not present

## 2023-07-29 DIAGNOSIS — I4892 Unspecified atrial flutter: Secondary | ICD-10-CM | POA: Diagnosis not present

## 2023-07-29 DIAGNOSIS — G20A1 Parkinson's disease without dyskinesia, without mention of fluctuations: Secondary | ICD-10-CM | POA: Diagnosis not present

## 2023-07-29 DIAGNOSIS — Z96641 Presence of right artificial hip joint: Secondary | ICD-10-CM | POA: Diagnosis present

## 2023-07-29 DIAGNOSIS — M810 Age-related osteoporosis without current pathological fracture: Secondary | ICD-10-CM | POA: Diagnosis present

## 2023-07-29 DIAGNOSIS — Z833 Family history of diabetes mellitus: Secondary | ICD-10-CM | POA: Diagnosis not present

## 2023-07-29 DIAGNOSIS — L539 Erythematous condition, unspecified: Secondary | ICD-10-CM | POA: Diagnosis not present

## 2023-07-29 DIAGNOSIS — H04123 Dry eye syndrome of bilateral lacrimal glands: Secondary | ICD-10-CM | POA: Diagnosis present

## 2023-07-29 DIAGNOSIS — I480 Paroxysmal atrial fibrillation: Secondary | ICD-10-CM | POA: Diagnosis not present

## 2023-07-29 DIAGNOSIS — Z8249 Family history of ischemic heart disease and other diseases of the circulatory system: Secondary | ICD-10-CM | POA: Diagnosis not present

## 2023-07-29 DIAGNOSIS — Z79899 Other long term (current) drug therapy: Secondary | ICD-10-CM | POA: Diagnosis not present

## 2023-07-29 DIAGNOSIS — Z803 Family history of malignant neoplasm of breast: Secondary | ICD-10-CM | POA: Diagnosis not present

## 2023-07-29 DIAGNOSIS — Z95828 Presence of other vascular implants and grafts: Secondary | ICD-10-CM | POA: Insufficient documentation

## 2023-07-29 LAB — CMP (CANCER CENTER ONLY)
ALT: <5 U/L (ref 0–44)
AST: 23 U/L (ref 15–41)
Albumin: 4 g/dL (ref 3.5–5.0)
Alkaline Phosphatase: 58 U/L (ref 38–126)
Anion gap: 6 (ref 5–15)
BUN: 21 mg/dL (ref 8–23)
CO2: 26 mmol/L (ref 22–32)
Calcium: 9.7 mg/dL (ref 8.9–10.3)
Chloride: 108 mmol/L (ref 98–111)
Creatinine: 1.02 mg/dL (ref 0.61–1.24)
GFR, Estimated: >60 mL/min (ref >60)
Glucose, Bld: 153 mg/dL — ABNORMAL HIGH (ref 70–99)
Potassium: 4 mmol/L (ref 3.5–5.1)
Sodium: 140 mmol/L (ref 135–145)
Total Bilirubin: 1 mg/dL (ref <1.2)
Total Protein: 6.8 g/dL (ref 6.5–8.1)

## 2023-07-29 LAB — CBC WITH DIFFERENTIAL (CANCER CENTER ONLY)
Abs Immature Granulocytes: 0.01 10*3/uL (ref 0.00–0.07)
Basophils Absolute: 0 10*3/uL (ref 0.0–0.1)
Basophils Relative: 1 %
Eosinophils Absolute: 0.1 10*3/uL (ref 0.0–0.5)
Eosinophils Relative: 3 %
HCT: 40.3 % (ref 39.0–52.0)
Hemoglobin: 12.8 g/dL — ABNORMAL LOW (ref 13.0–17.0)
Immature Granulocytes: 0 %
Lymphocytes Relative: 31 %
Lymphs Abs: 1.4 10*3/uL (ref 0.7–4.0)
MCH: 29.6 pg (ref 26.0–34.0)
MCHC: 31.8 g/dL (ref 30.0–36.0)
MCV: 93.3 fL (ref 80.0–100.0)
Monocytes Absolute: 0.5 10*3/uL (ref 0.1–1.0)
Monocytes Relative: 10 %
Neutro Abs: 2.5 10*3/uL (ref 1.7–7.7)
Neutrophils Relative %: 55 %
Platelet Count: 192 10*3/uL (ref 150–400)
RBC: 4.32 MIL/uL (ref 4.22–5.81)
RDW: 15.1 % (ref 11.5–15.5)
WBC Count: 4.6 10*3/uL (ref 4.0–10.5)
nRBC: 0 % (ref 0.0–0.2)

## 2023-07-29 MED ORDER — SODIUM CHLORIDE 0.9 % IV SOLN
INTRAVENOUS | Status: DC
Start: 2023-07-29 — End: 2023-07-29

## 2023-07-29 MED ORDER — PACLITAXEL PROTEIN-BOUND CHEMO INJECTION 100 MG
100.0000 mg/m2 | Freq: Once | INTRAVENOUS | Status: AC
  Administered 2023-07-29: 175 mg via INTRAVENOUS
  Filled 2023-07-29: qty 35

## 2023-07-29 MED ORDER — PROCHLORPERAZINE MALEATE 10 MG PO TABS: 10.0000 mg | ORAL_TABLET | Freq: Four times a day (QID) | ORAL | 2 refills | Status: DC | PRN

## 2023-07-29 MED ORDER — HEPARIN SOD (PORK) LOCK FLUSH 100 UNIT/ML IV SOLN
500.0000 [IU] | Freq: Once | INTRAVENOUS | Status: AC | PRN
  Administered 2023-07-29: 500 [IU]

## 2023-07-29 MED ORDER — SODIUM CHLORIDE 0.9% FLUSH
10.0000 mL | Freq: Once | INTRAVENOUS | Status: AC
  Administered 2023-07-29: 10 mL

## 2023-07-29 MED ORDER — SODIUM CHLORIDE 0.9 % IV SOLN
800.0000 mg/m2 | Freq: Once | INTRAVENOUS | Status: AC
  Administered 2023-07-29: 1406 mg via INTRAVENOUS
  Filled 2023-07-29: qty 36.98

## 2023-07-29 MED ORDER — PROCHLORPERAZINE MALEATE 10 MG PO TABS
10.0000 mg | ORAL_TABLET | Freq: Once | ORAL | Status: AC
  Administered 2023-07-29: 10 mg via ORAL
  Filled 2023-07-29: qty 1

## 2023-07-29 MED ORDER — SODIUM CHLORIDE 0.9% FLUSH
10.0000 mL | INTRAVENOUS | Status: DC | PRN
  Administered 2023-07-29: 10 mL

## 2023-07-29 NOTE — Patient Instructions (Signed)
 [facility] - A DEPT OF [facility]  Discharge Instructions: Thank you for choosing [facility] to provide your oncology and hematology care.   If you have a lab appointment with the Cancer Center, please go directly to the Cancer Center and check in at the registration area.   Wear comfortable clothing and clothing appropriate for easy access to any Portacath or PICC line.   We strive to give you quality time with your provider. You may need to reschedule your appointment if you arrive late (15 or more minutes).  Arriving late affects you and other patients whose appointments are after yours.  Also, if you miss three or more appointments without notifying the office, you may be dismissed from the clinic at the provider's discretion.      For prescription refill requests, have your pharmacy contact our office and allow 72 hours for refills to be completed.    Today you received the following chemotherapy and/or immunotherapy agents Abraxane / Gemzar      To help prevent nausea and vomiting after your treatment, we encourage you to take your nausea medication as directed.  BELOW ARE SYMPTOMS THAT SHOULD BE REPORTED IMMEDIATELY: *FEVER GREATER THAN 100.4 F (38 C) OR HIGHER *CHILLS OR SWEATING *NAUSEA AND VOMITING THAT IS NOT CONTROLLED WITH YOUR NAUSEA MEDICATION *UNUSUAL SHORTNESS OF BREATH *UNUSUAL BRUISING OR BLEEDING *URINARY PROBLEMS (pain or burning when urinating, or frequent urination) *BOWEL PROBLEMS (unusual diarrhea, constipation, pain near the anus) TENDERNESS IN MOUTH AND THROAT WITH OR WITHOUT PRESENCE OF ULCERS (sore throat, sores in mouth, or a toothache) UNUSUAL RASH, SWELLING OR PAIN  UNUSUAL VAGINAL DISCHARGE OR ITCHING   Items with * indicate a potential emergency and should be followed up as soon as possible or go to the Emergency Department if any problems should occur.  Please show the CHEMOTHERAPY ALERT CARD or  IMMUNOTHERAPY ALERT CARD at check-in to the Emergency Department and triage nurse.  Should you have questions after your visit or need to cancel or reschedule your appointment, please contact [facility] - A DEPT OF [facility]  Dept: [phone]  and follow the prompts.  Office hours are 8:00 a.m. to 4:30 p.m. Monday - Friday. Please note that voicemails left after 4:00 p.m. may not be returned until the following business day.  We are closed weekends and major holidays. You have access to a nurse at all times for urgent questions. Please call the main number to the clinic Dept: [phone] and follow the prompts.   For any non-urgent questions, you may also contact your provider using MyChart. We now offer e-Visits for anyone 52 and older to request care online for non-urgent symptoms. For details visit [email or web address].   Also download the MyChart app! Go to the app store, search "MyChart", open the app, select , and log in with your MyChart username and password.

## 2023-07-29 NOTE — Progress Notes (Signed)
[facility]   [phone] [phone]   Clinic Follow up Note   Patient Care Team: Lorenda Ishihara, MD as PCP - General (Internal Medicine) Tessa Lerner, DO as PCP - Cardiology (Cardiology) Tat, Octaviano Batty, DO as Consulting Physician (Neurology) Earna Coder, MD as Consulting Physician (Hematology and Oncology) Fritzi Mandes, MD as Consulting Physician (General Surgery) Malachy Mood, MD as Consulting Physician (Hematology and Oncology) Josephine Igo, DO as Consulting Physician (Pulmonary Disease)  Date of Service:  07/29/2023  CHIEF COMPLAINT: f/u of pancreatic cancer  CURRENT THERAPY:  Chemotherapy gemcitabine and Abraxane every 2 weeks  Oncology History   Pancreatic cancer [facility]) diagnosed in 09/2021, deemed poor surgical candidate due to his age and medical co-morbidities -S/p single agent gemcitabine 01/13/22 - 04/01/22 and 5 fx SBRT [name]) 05/04/22 - 05/14/22. CA 19-9 decreased after treatment -Surveillance CT 11/09/2022 showed slightly decreased size of pancreatic head mass, no evidence of metastatic disease.  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  He is taking ibuprofen as needed.  No other clinical concern for cancer progression. -Lab reviewed, overall stable, tumor marker still pending. -He was admitted for right femoral neck fracture  on 02/03/23 after a fall and had surgery  -CT scan from May 17, 2023 showed multiple lung nodules, with dominant 1.5 cm nodule in the right middle lobe, highly concerning for metastasis.  His lung lesions are positive on PET scan. -Bronchoscopy and biopsy attempted, which showed atypical cells. -He restarted chemo gemcitabine and abraxane on 07/29/2023    Assessment and Plan    Recurrent Prostate Cancer 82 year old with recurrent prostate cancer undergoing bi-weekly chemotherapy. Adequate pain control with oxycodone (1.5  tablets/day). Recently placed port accessed for blood draw; infusion scheduled today. Previous infection noted; advised to monitor port site for infection. Compazine and Zofran prescribed for nausea. Ensure and Boost recommended if appetite decreases. Explained regimen's 82 year old better tolerance in older patients. CA19-9 levels monitored monthly; last test on November 14. - Administer chemotherapy today - Monitor port site for infection - Use Compazine and Zofran for nausea as needed - Call in prescription for Compazine - Recommend Ensure and Boost as dietary supplements - Schedule next chemotherapy cycle for December 6 - Follow up in two weeks  Cancer related pain Adequate pain control with oxycodone (1.5 tablets/day). Refill requested and processed. - Continue oxycodone as needed for pain - Monitor pain levels and adjust medication as necessary.     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 Sclerotic 1.5 cm left ischial lesion/ Sclerotic medial left iliac bone 1.5 cm lesion (series 2/image 47), increased from 1.0 cm. Gleason score of 4+5= 9; with almost all cores involved greater than 80%.  9/16 Lupron 42-month depot on 9/16. [Urology; Dr.Siniski]  # MID OCT 2020- Zytiga 1000 mg+ prednisone; stopped December 2021 [poor tolerance if with RVR]; DISCONTINUED.   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       Prostate  cancer metastatic to bone (HCC)  05/24/2019 Initial Diagnosis   Prostate cancer metastatic  to bone [facility])   Pancreatic cancer (HCC)  10/02/2021 Imaging   CT ABDOMEN PELVIS W CONTRAST   IMPRESSION: 1. There is new pancreatic ductal dilation with an indeterminate 19 mm hypodense low-density mass area in the head/uncinate process of the pancreas. Recommend further evaluation with dedicated MRI/MRCP with contrast. 2. No evidence of bowel obstruction.  Moderate rectal stool ball. 3. Scattered peripherally located clustered pulmonary nodules in the RIGHT middle lobe. Findings are likely infectious or inflammatory in etiology. Given history of malignancy, recommend follow-up as per clinical protocol.   10/28/2021 Imaging   MR ABDOMEN MRCP W WO CONTAST   IMPRESSION: 1. Examination is significantly limited by breath motion artifact throughout. 2. The main pancreatic duct is diffusely dilated from the level of the superior pancreatic head, measuring up to 0.7 cm. 3. In the inferior pancreatic head and uncinate, there is a multilobulated, fluid signal cystic lesion measuring 1.8 x 1.0 cm. Due to breath motion artifact it is difficult to determine whether this communicates to the adjacent duct. There are multiple additional subcentimeter cystic lesions scattered throughout the pancreas, several of which clearly communicate to the main pancreatic duct. 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 Recommend EUS/FNA for further diagnosis given the presence of pancreatic ductal dilatation. 4. Status post left nephrectomy. 5. No evidence of recurrent or metastatic disease in the abdomen.   12/25/2021 Procedure   UPPER ENDOSCOPIC ULTRASOUND-By Dr. Meridee Score  - A mass-like region was identified in the pancreatic head where the pancreatic duct dilates with multiple cystic regions noted throughout the pancreas as well. 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 However, the endosonographic appearance is suspicious for  potential adenocarcinoma. This was staged T2 N0 Mx by endosonographic criteria. T - No malignant-appearing lymph nodes were visualized in the celiac region (level 20), peripancreatic region and porta hepatis region.   12/25/2021 Pathology Results   CYTOLOGY - NON PAP  CASE: [id number]   FINAL MICROSCOPIC DIAGNOSIS:  A. PANCREAS, HEAD, FINE NEEDLE ASPIRATION:  - Malignant cells consistent with adenocarcinoma     01/01/2022 Initial Diagnosis   Pancreatic cancer (HCC)   01/13/2022 - 04/01/2022 Chemotherapy   Patient is on Treatment Plan : PANCREAS Gemcitabine D1,15 q28d x 4 Cycles     01/26/2022 Cancer Staging   Staging form: Exocrine Pancreas, AJCC 8th Edition - Clinical: Stage IB (cT2, cN0, cM0) - Signed by Malachy Mood, MD on 01/26/2022 Total positive nodes: 0    Genetic Testing   [facility] CancerNext-Expanded Panel was Negative. Of note, a variant of uncertain significance was identified in the BLM gene ([id number]) and GALNT12 gene ([id number]). Report date is 01/26/2022.  The CancerNext-Expanded gene panel offered by [facility] and includes sequencing, rearrangement, and RNA analysis for the following 77 genes: AIP, ALK, APC, ATM, AXIN2, BAP1, BARD1, BLM, BMPR1A, BRCA1, BRCA2, BRIP1, CDC73, CDH1, CDK4, CDKN1B, CDKN2A, CHEK2, CTNNA1, DICER1, FANCC, FH, FLCN, GALNT12, KIF1B, LZTR1, MAX, MEN1, MET, MLH1, MSH2, MSH3, MSH6, MUTYH, NBN, NF1, NF2, NTHL1, PALB2, PHOX2B, PMS2, POT1, PRKAR1A, PTCH1, PTEN, RAD51C, RAD51D, RB1, RECQL, RET, SDHA, SDHAF2, SDHB, SDHC, SDHD, SMAD4, SMARCA4, SMARCB1, SMARCE1, STK11, SUFU, TMEM127, TP53, TSC1, TSC2, VHL and XRCC2 (sequencing and deletion/duplication); EGFR, EGLN1, HOXB13, KIT, MITF, PDGFRA, POLD1, and POLE (sequencing only); EPCAM and GREM1 (deletion/duplication only).    08/12/2022 Imaging    IMPRESSION: 1. Slight interval increase in size of the pancreatic  head-uncinate process mass with increasing direct contact of the SMA and portal vein as  discussed. Also with a replaced RIGHT hepatic artery which passes through the tumor. 2. No signs of acute inflammation currently about the pancreas. With similar appearance of ductal obstruction and peripheral atrophy of pancreas due to the tumor. 3. Post LEFT nephrectomy. 4. Sclerotic bony lesions of the LEFT ischium and LEFT iliac bone similar to prior imaging. 5. Mild hyperenhancement of LEFT prostate 14 mm with asymmetry, nonspecific. This is unchanged in this patient with history of prostate neoplasm. 6. Aortic atherosclerosis.   07/29/2023 -  Chemotherapy   Patient is on Treatment Plan : PANCREATIC Abraxane D1,8,15 + Gemcitabine D1,8,15 q28d        Discussed the use of AI scribe software for clinical note transcription with the patient, who gave verbal consent to proceed.  History of Present Illness   The patient, an 82 year old with recurrent prostate cancer, reports taking approximately 1.5 tablets of oxycodone daily for pain management, which he finds effective. He recently had a port placed for chemotherapy administration, which he believes went well. He is aware of potential side effects, including fatigue, appetite changes, and possible nausea. He also has a prescription for nausea medication at home. He recently had a CA19-9 test and is scheduled for another in a month. He also reports some redness around the site of the recently placed port, but no significant discomfort.         All other systems were reviewed with the patient and are negative.  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)    Prostate cancer metastatic to bone (HCC)    REM sleep behavior disorder    Sleep apnea    BiPap - uses nightly    SURGICAL  HISTORY: Past Surgical History:  Procedure Laterality Date   ANTERIOR APPROACH HEMI HIP ARTHROPLASTY Right 02/05/2023   Procedure: ANTERIOR APPROACH HEMI HIP ARTHROPLASTY;  Surgeon: Jodi Geralds, MD;  Location: [facility] OR;  Service: Orthopedics;  Laterality: Right;   APPENDECTOMY  1963   BIOPSY  12/25/2021   Procedure: BIOPSY;  Surgeon: Meridee Score Netty Starring., MD;  Location: [facility] ENDOSCOPY;  Service: Gastroenterology;;   BRONCHIAL BIOPSY  07/05/2023   Procedure: BRONCHIAL BIOPSIES;  Surgeon: Josephine Igo, DO;  Location: [facility] ENDOSCOPY;  Service: Pulmonary;;   COLONOSCOPY  2010, 2015   ESOPHAGOGASTRODUODENOSCOPY N/A 04/16/2022   Procedure: ESOPHAGOGASTRODUODENOSCOPY (EGD);  Surgeon: Lemar Lofty., MD;  Location: [facility] ENDOSCOPY;  Service: Gastroenterology;  Laterality: N/A;   ESOPHAGOGASTRODUODENOSCOPY (EGD) WITH PROPOFOL N/A 12/25/2021   Procedure: ESOPHAGOGASTRODUODENOSCOPY (EGD) WITH PROPOFOL;  Surgeon: Meridee Score Netty Starring., MD;  Location: [facility] ENDOSCOPY;  Service: Gastroenterology;  Laterality: N/A;   EUS N/A 12/25/2021   Procedure: UPPER ENDOSCOPIC ULTRASOUND (EUS) RADIAL;  Surgeon: Lemar Lofty., MD;  Location: [facility] ENDOSCOPY;  Service: Gastroenterology;  Laterality: N/A;   EUS N/A 04/16/2022   Procedure: UPPER ENDOSCOPIC ULTRASOUND (EUS) RADIAL;  Surgeon: Lemar Lofty., MD;  Location: [facility] ENDOSCOPY;  Service: Gastroenterology;  Laterality: N/A;   FIDUCIAL MARKER PLACEMENT N/A 04/16/2022   Procedure: FIDUCIAL MARKER PLACEMENT;  Surgeon: Lemar Lofty., MD;  Location: [facility] ENDOSCOPY;  Service: Gastroenterology;  Laterality: N/A;   FINE NEEDLE ASPIRATION  12/25/2021   Procedure: FINE NEEDLE ASPIRATION (FNA) LINEAR;  Surgeon: Lemar Lofty., MD;  Location: [facility] ENDOSCOPY;  Service: Gastroenterology;;   IR IMAGING GUIDED PORT INSERTION  07/28/2023   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   KYPHOPLASTY N/A 08/15/2020   Procedure: L2 compression fracture;  Surgeon: Kennedy Bucker, MD;  Location: [facility] ORS;  Service: Orthopedics;  Laterality: N/A;   KYPHOPLASTY N/A 08/29/2020   Procedure: T8 KYPHOPLASTY;  Surgeon: Kennedy Bucker, MD;  Location: [facility] ORS;  Service: Orthopedics;  Laterality: N/A;   KYPHOPLASTY N/A 11/12/2020   Procedure: L1 KYPHOPLASTY;  Surgeon: Kennedy Bucker, MD;  Location: [facility] ORS;  Service: Orthopedics;  Laterality: N/A;   PORTACATH PLACEMENT N/A 02/05/2022   Procedure: INSERTION PORT-A-CATH;  Surgeon: Fritzi Mandes, MD;  Location: [facility] ORS;  Service: General;  Laterality: N/A;    I have reviewed the social history and family history with the patient and they are unchanged from previous note.  ALLERGIES:  is allergic to influenza vaccines and influenza virus vaccine.  MEDICATIONS:  Current Outpatient Medications  Medication Sig Dispense Refill   prochlorperazine (COMPAZINE) 10 MG tablet Take 1 tablet (10 mg total) by mouth every 6 (six) hours as needed for nausea or vomiting. 30 tablet 2   acetaminophen (TYLENOL) 500 MG tablet Take 1 tablet (500 mg total) by mouth every 6 (six) hours as needed for moderate pain or mild pain. 30 tablet 0   ammonium lactate (LAC-HYDRIN) 12 % lotion Apply 1 Application topically as needed.     bisacodyl (DULCOLAX) 10 MG suppository Place 1 suppository (10 mg total) rectally daily as needed for moderate constipation. 12 suppository 0   carbidopa-levodopa (SINEMET CR) 50-200 MG tablet Take 1 tablet by mouth at bedtime. 90 tablet 0   carbidopa-levodopa (SINEMET IR) 25-100 MG tablet Take 3 tablets at 7 AM, 2 tablets at 11 AM, 3 tablets at 3 PM, 2 tablets at 7 PM 900 tablet 0   ciclopirox (PENLAC) 8 % solution Apply 1 Application topically at bedtime.     clonazePAM (KLONOPIN) 0.5 MG tablet Take 1 tablet (0.5 mg total) by mouth at bedtime. 5 tablet 0   cyanocobalamin (VITAMIN B12) 1000 MCG tablet Take 1,000 mcg by mouth daily.     diltiazem (CARDIZEM) 30 MG tablet Take 1 tablet (30 mg total) by mouth 2 (two) times daily.  Do not take if systolic blood pressure (top number) is less than 100, or if pulse if less than 55. 60 tablet 3   ELIQUIS 5 MG TABS tablet TAKE 1 TABLET TWICE A DAY 120 tablet 5   Glycerin-Hypromellose-PEG 400 (VISINE DRY EYE OP) Place 1 drop into both eyes 2 (two) times daily as needed (eye irritation).      ketoconazole (NIZORAL) 2 % shampoo Apply 1 application  topically 3 (three) times a week.     leuprolide, 6 Month, (ELIGARD) 45 MG injection Inject 45 mg into the skin every 6 (six) months.     omeprazole (PRILOSEC) 20 MG capsule Take 1 capsule (20 mg total) by mouth daily. 90 capsule 3   ondansetron (ZOFRAN) 4 MG tablet Take 1 tablet (4 mg total) by mouth every 6 (six) hours as needed for nausea. 20 tablet 0   oxyCODONE (OXY IR/ROXICODONE) 5 MG immediate release tablet Take 1 tablet (5 mg total) by mouth every 6 (six) hours as needed for severe pain (pain score 7-10). 60 tablet 0   PANCREAZE 37000-97300 units CPEP TAKE 2 CAPSULES WITH BREAKFAST, LUNCH, AND EVENING  MEAL (WITH EACH MEAL) AND 1 CAPSULE WITH SNACK 600 capsule 5   polyethylene glycol powder (MIRALAX) 17 GM/SCOOP powder Take 17 g by mouth in the morning, at noon, and at bedtime.     rosuvastatin (CRESTOR) 10 MG tablet TAKE 1 TABLET DAILY 90 tablet 1   tiZANidine (ZANAFLEX) 2 MG tablet Take 1 tablet (2 mg total) by mouth every 8 (eight) hours as needed for muscle spasms. 10 tablet 0   triamcinolone cream (KENALOG) 0.1 % Apply 1 application topically 2 (two) times daily. (Patient taking differently: Apply 1 application  topically 2 (two) times daily as needed (itching).) 453.6 g 1   zoledronic acid (RECLAST) 5 MG/100ML SOLN injection Inject 5 mg into the vein See admin instructions. Once a year     No current facility-administered medications for this visit.   Facility-Administered Medications Ordered in Other Visits  Medication Dose Route Frequency Provider Last Rate Last Admin   0.9 %  sodium chloride infusion   Intravenous  Continuous Malachy Mood, MD 10 mL/hr at 07/29/23 0913 New Bag at 07/29/23 0913   gemcitabine (GEMZAR) 1,406 mg in sodium chloride 0.9 % 250 mL chemo infusion  800 mg/m2 (Treatment Plan Recorded) Intravenous Once Malachy Mood, MD       heparin lock flush 100 unit/mL  500 Units Intracatheter Once PRN Malachy Mood, MD       PACLitaxel-protein bound (ABRAXANE) chemo infusion 175 mg  100 mg/m2 (Treatment Plan Recorded) Intravenous Once Malachy Mood, MD 70 mL/hr at 07/29/23 0950 175 mg at 07/29/23 0950   sodium chloride flush (NS) 0.9 % injection 10 mL  10 mL Intracatheter PRN Malachy Mood, MD        PHYSICAL EXAMINATION: ECOG PERFORMANCE STATUS: 1 - Symptomatic but completely ambulatory  Vitals:   07/29/23 0822  BP: 101/72  Pulse: (!) 104  Resp: 15  Temp: 98.1 F (36.7 C)  SpO2: 100%   Wt Readings from Last 3 Encounters:  07/29/23 149 lb 8 oz (67.8 kg)  07/28/23 149 lb 11.1 oz (67.9 kg)  07/22/23 149 lb 11.2 oz (67.9 kg)     GENERAL:alert, no distress and comfortable SKIN: skin color, texture, turgor are normal, no rashes or significant lesions except diffuse skin redness around his new port EYES: normal, Conjunctiva are pink and non-injected, sclera clear NECK: supple, thyroid normal size, non-tender, without nodularity LYMPH:  no palpable lymphadenopathy in the cervical, axillary  LUNGS: clear to auscultation and percussion with normal breathing effort HEART: regular rate & rhythm and no murmurs and no lower extremity edema ABDOMEN:abdomen soft, non-tender and normal bowel sounds Musculoskeletal:no cyanosis of digits and no clubbing  NEURO: alert & oriented x 3 with fluent speech, no focal motor/sensory deficits   LABORATORY DATA:  I have reviewed the data as listed    Latest Ref Rng & Units 07/29/2023    7:55 AM 07/22/2023    8:40 AM 07/05/2023    7:01 AM  CBC  WBC 4.0 - 10.5 K/uL 4.6  5.3  4.5   Hemoglobin 13.0 - 17.0 g/dL 13.0  86.5  78.4   Hematocrit 39.0 - 52.0 % 40.3  40.2  41.2    Platelets 150 - 400 K/uL 192  178  172         Latest Ref Rng & Units 07/29/2023    7:55 AM 07/22/2023    8:40 AM 07/05/2023    7:01 AM  CMP  Glucose 70 - 99 mg/dL 696  95  95  BUN 8 - 23 mg/dL 21  18  21    Creatinine 0.61 - 1.24 mg/dL 2.95  6.21  3.08   Sodium 135 - 145 mmol/L 140  143  139   Potassium 3.5 - 5.1 mmol/L 4.0  3.9  3.9   Chloride 98 - 111 mmol/L 108  107  106   CO2 22 - 32 mmol/L 26  31  23    Calcium 8.9 - 10.3 mg/dL 9.7  9.8  9.4   Total Protein 6.5 - 8.1 g/dL 6.8  7.1  6.9   Total Bilirubin <1.2 mg/dL 1.0  1.0  1.1   Alkaline Phos 38 - 126 U/L 58  57  52   AST 15 - 41 U/L 23  25  33   ALT 0 - 44 U/L <5  <5  <5       RADIOGRAPHIC STUDIES: I have personally reviewed the radiological images as listed and agreed with the findings in the report. IR IMAGING GUIDED PORT INSERTION  Result Date: 07/28/2023 INDICATION: 82 year old male with metastatic pancreatic cancer. He presents for port catheter placement. EXAM: IMPLANTED PORT A CATH PLACEMENT WITH ULTRASOUND AND FLUOROSCOPIC GUIDANCE MEDICATIONS: None. ANESTHESIA/SEDATION: Versed 2 mg IV; Fentanyl 75 mcg IV; administered by the radiology nurse Moderate Sedation Time:  15 minutes The patient's vital signs and level of consciousness were continuously monitored during the procedure by the interventional radiology nurse under my direct supervision. FLUOROSCOPY: Radiation exposure index: 1 mGy reference air kerma COMPLICATIONS: None immediate. PROCEDURE: The right neck and chest was prepped with chlorhexidine, and draped in the usual sterile fashion using maximum barrier technique (cap and mask, sterile gown, sterile gloves, large sterile sheet, hand hygiene and cutaneous antiseptic). Local anesthesia was attained by infiltration with 1% lidocaine with epinephrine. Ultrasound demonstrated patency of the right internal jugular vein, and this was documented with an image. Under real-time ultrasound guidance, this vein was  accessed with a 21 gauge micropuncture needle and image documentation was performed. A small dermatotomy was made at the access site with an 11 scalpel. A 0.018" wire was advanced into the SVC and the access needle exchanged for a 73F micropuncture vascular sheath. The 0.018" wire was then removed and a 0.035" wire advanced into the IVC. An appropriate location for the subcutaneous reservoir was selected below the clavicle and an incision was made through the skin and underlying soft tissues. The subcutaneous tissues were then dissected using a combination of blunt and sharp surgical technique and a pocket was formed. A single lumen power injectable portacatheter [facility]) was then tunneled through the subcutaneous tissues from the pocket to the dermatotomy and the port reservoir placed within the subcutaneous pocket. The venous access site was then serially dilated and a peel away vascular sheath placed over the wire. The wire was removed and the port catheter advanced into position under fluoroscopic guidance. The catheter tip is positioned in the superior cavoatrial junction. This was documented with a spot image. The portacatheter was then tested and found to flush and aspirate well. The port was flushed with saline followed by 100 units/mL heparinized saline. The pocket was then closed in two layers using first subdermal inverted interrupted absorbable sutures followed by a running subcuticular suture. The epidermis was then sealed with Dermabond. The dermatotomy at the venous access site was also closed with Dermabond. IMPRESSION: Successful placement of a right IJ approach Smart Port with ultrasound and fluoroscopic guidance. The catheter is ready for use. Electronically Signed  By: Malachy Moan M.D.   On: 07/28/2023 16:02      Orders Placed This Encounter  Procedures   CBC with Differential (Cancer Center Only)    Standing Status:   Future    Standing Expiration Date:    09/22/2024   CMP (Cancer Center only)    Standing Status:   Future    Standing Expiration Date:   09/22/2024   CBC with Differential (Cancer Center Only)    Standing Status:   Future    Standing Expiration Date:   10/06/2024   CMP (Cancer Center only)    Standing Status:   Future    Standing Expiration Date:   10/06/2024   All questions were answered. The patient knows to call the clinic with any problems, questions or concerns. No barriers to learning was detected. The total time spent in the appointment was 30 minutes.     Malachy Mood, MD 07/29/2023

## 2023-07-30 ENCOUNTER — Encounter: Payer: Self-pay | Admitting: Internal Medicine

## 2023-07-30 ENCOUNTER — Encounter: Payer: Self-pay | Admitting: Hematology

## 2023-07-30 NOTE — Telephone Encounter (Signed)
Called pt to see how he did with his recent treatment.  He reports fatigue & itching at port site as only symptoms.  He woke up early this am.  He knows how to reach [place] if needed & knows reasons to call.  He knows his next appts.

## 2023-07-30 NOTE — Telephone Encounter (Signed)
-----   Message from Nurse Reece Levy sent at 07/29/2023 11:24 AM EST ----- Regarding: Dr. Mosetta Putt 1st time Abraxane f/u tol well Dr. Mosetta Putt 1st time Abraxane f/u tol well Pt call back due.

## 2023-07-31 ENCOUNTER — Other Ambulatory Visit: Payer: Self-pay

## 2023-07-31 ENCOUNTER — Inpatient Hospital Stay (HOSPITAL_BASED_OUTPATIENT_CLINIC_OR_DEPARTMENT_OTHER)
Admission: EM | Admit: 2023-07-31 | Discharge: 2023-08-03 | DRG: 315 | Disposition: A | Discharge status: Home / Self Care | Payer: Medicare Other | Attending: Family Medicine | Admitting: Family Medicine

## 2023-07-31 ENCOUNTER — Emergency Department (HOSPITAL_BASED_OUTPATIENT_CLINIC_OR_DEPARTMENT_OTHER): Payer: Medicare Other

## 2023-07-31 DIAGNOSIS — I4892 Unspecified atrial flutter: Secondary | ICD-10-CM | POA: Diagnosis present

## 2023-07-31 DIAGNOSIS — Z8249 Family history of ischemic heart disease and other diseases of the circulatory system: Secondary | ICD-10-CM | POA: Diagnosis not present

## 2023-07-31 DIAGNOSIS — G20A1 Parkinson's disease without dyskinesia, without mention of fluctuations: Secondary | ICD-10-CM | POA: Diagnosis not present

## 2023-07-31 DIAGNOSIS — Z9221 Personal history of antineoplastic chemotherapy: Secondary | ICD-10-CM

## 2023-07-31 DIAGNOSIS — H04123 Dry eye syndrome of bilateral lacrimal glands: Secondary | ICD-10-CM | POA: Diagnosis present

## 2023-07-31 DIAGNOSIS — C61 Malignant neoplasm of prostate: Secondary | ICD-10-CM | POA: Diagnosis present

## 2023-07-31 DIAGNOSIS — Z803 Family history of malignant neoplasm of breast: Secondary | ICD-10-CM

## 2023-07-31 DIAGNOSIS — Z96641 Presence of right artificial hip joint: Secondary | ICD-10-CM | POA: Diagnosis present

## 2023-07-31 DIAGNOSIS — Y848 Other medical procedures as the cause of abnormal reaction of the patient, or of later complication, without mention of misadventure at the time of the procedure: Secondary | ICD-10-CM | POA: Diagnosis not present

## 2023-07-31 DIAGNOSIS — C259 Malignant neoplasm of pancreas, unspecified: Secondary | ICD-10-CM | POA: Diagnosis present

## 2023-07-31 DIAGNOSIS — G4733 Obstructive sleep apnea (adult) (pediatric): Secondary | ICD-10-CM | POA: Diagnosis not present

## 2023-07-31 DIAGNOSIS — I480 Paroxysmal atrial fibrillation: Secondary | ICD-10-CM | POA: Diagnosis not present

## 2023-07-31 DIAGNOSIS — Z87891 Personal history of nicotine dependence: Secondary | ICD-10-CM

## 2023-07-31 DIAGNOSIS — N400 Enlarged prostate without lower urinary tract symptoms: Secondary | ICD-10-CM | POA: Diagnosis present

## 2023-07-31 DIAGNOSIS — I959 Hypotension, unspecified: Secondary | ICD-10-CM | POA: Diagnosis present

## 2023-07-31 DIAGNOSIS — M810 Age-related osteoporosis without current pathological fracture: Secondary | ICD-10-CM | POA: Diagnosis present

## 2023-07-31 DIAGNOSIS — Z8546 Personal history of malignant neoplasm of prostate: Secondary | ICD-10-CM

## 2023-07-31 DIAGNOSIS — Z79899 Other long term (current) drug therapy: Secondary | ICD-10-CM

## 2023-07-31 DIAGNOSIS — A419 Sepsis, unspecified organism: Secondary | ICD-10-CM | POA: Diagnosis not present

## 2023-07-31 DIAGNOSIS — T80219A Unspecified infection due to central venous catheter, initial encounter: Secondary | ICD-10-CM | POA: Diagnosis not present

## 2023-07-31 DIAGNOSIS — I4891 Unspecified atrial fibrillation: Secondary | ICD-10-CM | POA: Diagnosis present

## 2023-07-31 DIAGNOSIS — L0390 Cellulitis, unspecified: Secondary | ICD-10-CM | POA: Diagnosis not present

## 2023-07-31 DIAGNOSIS — R21 Rash and other nonspecific skin eruption: Secondary | ICD-10-CM | POA: Diagnosis not present

## 2023-07-31 DIAGNOSIS — Z833 Family history of diabetes mellitus: Secondary | ICD-10-CM | POA: Diagnosis not present

## 2023-07-31 DIAGNOSIS — C7951 Secondary malignant neoplasm of bone: Secondary | ICD-10-CM | POA: Diagnosis present

## 2023-07-31 DIAGNOSIS — G8929 Other chronic pain: Secondary | ICD-10-CM | POA: Diagnosis present

## 2023-07-31 DIAGNOSIS — Z923 Personal history of irradiation: Secondary | ICD-10-CM

## 2023-07-31 DIAGNOSIS — Z887 Allergy status to serum and vaccine status: Secondary | ICD-10-CM | POA: Diagnosis not present

## 2023-07-31 DIAGNOSIS — G4730 Sleep apnea, unspecified: Secondary | ICD-10-CM | POA: Diagnosis present

## 2023-07-31 DIAGNOSIS — Z7901 Long term (current) use of anticoagulants: Secondary | ICD-10-CM | POA: Diagnosis not present

## 2023-07-31 DIAGNOSIS — R918 Other nonspecific abnormal finding of lung field: Secondary | ICD-10-CM | POA: Diagnosis present

## 2023-07-31 DIAGNOSIS — R001 Bradycardia, unspecified: Secondary | ICD-10-CM | POA: Diagnosis not present

## 2023-07-31 DIAGNOSIS — Z0389 Encounter for observation for other suspected diseases and conditions ruled out: Secondary | ICD-10-CM | POA: Diagnosis not present

## 2023-07-31 DIAGNOSIS — T50995A Adverse effect of other drugs, medicaments and biological substances, initial encounter: Secondary | ICD-10-CM | POA: Diagnosis present

## 2023-07-31 DIAGNOSIS — R531 Weakness: Secondary | ICD-10-CM | POA: Diagnosis present

## 2023-07-31 LAB — PROTIME-INR
INR: 1.3 — ABNORMAL HIGH (ref 0.8–1.2)
Prothrombin Time: 16.8 s — ABNORMAL HIGH (ref 11.4–15.2)

## 2023-07-31 LAB — COMPREHENSIVE METABOLIC PANEL
ALT: <5 U/L (ref 0–44)
AST: 31 U/L (ref 15–41)
Albumin: 4 g/dL (ref 3.5–5.0)
Alkaline Phosphatase: 56 U/L (ref 38–126)
Anion gap: 7 (ref 5–15)
BUN: 25 mg/dL — ABNORMAL HIGH (ref 8–23)
CO2: 26 mmol/L (ref 22–32)
Calcium: 9.2 mg/dL (ref 8.9–10.3)
Chloride: 104 mmol/L (ref 98–111)
Creatinine, Ser: 1.01 mg/dL (ref 0.61–1.24)
GFR, Estimated: >60 mL/min (ref >60)
Glucose, Bld: 121 mg/dL — ABNORMAL HIGH (ref 70–99)
Potassium: 4.9 mmol/L (ref 3.5–5.1)
Sodium: 137 mmol/L (ref 135–145)
Total Bilirubin: 1.9 mg/dL — ABNORMAL HIGH (ref <1.2)
Total Protein: 6.6 g/dL (ref 6.5–8.1)

## 2023-07-31 LAB — URINALYSIS, W/ REFLEX TO CULTURE (INFECTION SUSPECTED)
Bacteria, UA: NONE SEEN
Bilirubin Urine: NEGATIVE
Glucose, UA: NEGATIVE mg/dL
Hgb urine dipstick: NEGATIVE
Ketones, ur: NEGATIVE mg/dL
Leukocytes,Ua: NEGATIVE
Nitrite: NEGATIVE
Protein, ur: NEGATIVE mg/dL
Specific Gravity, Urine: 1.013 (ref 1.005–1.030)
pH: 6.5 (ref 5.0–8.0)

## 2023-07-31 LAB — CBC WITH DIFFERENTIAL/PLATELET
Abs Immature Granulocytes: 0.02 10*3/uL (ref 0.00–0.07)
Basophils Absolute: 0 10*3/uL (ref 0.0–0.1)
Basophils Relative: 1 %
Eosinophils Absolute: 0.1 10*3/uL (ref 0.0–0.5)
Eosinophils Relative: 2 %
HCT: 33.9 % — ABNORMAL LOW (ref 39.0–52.0)
Hemoglobin: 11 g/dL — ABNORMAL LOW (ref 13.0–17.0)
Immature Granulocytes: 0 %
Lymphocytes Relative: 10 %
Lymphs Abs: 0.6 10*3/uL — ABNORMAL LOW (ref 0.7–4.0)
MCH: 30.1 pg (ref 26.0–34.0)
MCHC: 32.4 g/dL (ref 30.0–36.0)
MCV: 92.6 fL (ref 80.0–100.0)
Monocytes Absolute: 0.3 10*3/uL (ref 0.1–1.0)
Monocytes Relative: 5 %
Neutro Abs: 4.5 10*3/uL (ref 1.7–7.7)
Neutrophils Relative %: 82 %
Platelets: 137 10*3/uL — ABNORMAL LOW (ref 150–400)
RBC: 3.66 MIL/uL — ABNORMAL LOW (ref 4.22–5.81)
RDW: 15 % (ref 11.5–15.5)
WBC: 5.5 10*3/uL (ref 4.0–10.5)
nRBC: 0 % (ref 0.0–0.2)

## 2023-07-31 LAB — LACTIC ACID, PLASMA
Lactic Acid, Venous: 0.7 mmol/L (ref 0.5–1.9)
Lactic Acid, Venous: 1 mmol/L (ref 0.5–1.9)

## 2023-07-31 LAB — LIPASE, BLOOD: Lipase: 69 U/L — ABNORMAL HIGH (ref 11–51)

## 2023-07-31 LAB — APTT: aPTT: 36 s (ref 24–36)

## 2023-07-31 MED ORDER — CARBIDOPA-LEVODOPA 25-100 MG PO TABS
3.0000 | ORAL_TABLET | ORAL | Status: DC
  Administered 2023-08-01 – 2023-08-03 (×5): 3 via ORAL
  Filled 2023-07-31 (×5): qty 3

## 2023-07-31 MED ORDER — VANCOMYCIN HCL IN DEXTROSE 1-5 GM/200ML-% IV SOLN
1000.0000 mg | Freq: Once | INTRAVENOUS | Status: AC
  Administered 2023-07-31: 1000 mg via INTRAVENOUS
  Filled 2023-07-31: qty 200

## 2023-07-31 MED ORDER — DILTIAZEM HCL 30 MG PO TABS
30.0000 mg | ORAL_TABLET | Freq: Two times a day (BID) | ORAL | Status: DC
Start: 2023-07-31 — End: 2023-08-03
  Administered 2023-07-31 – 2023-08-03 (×6): 30 mg via ORAL
  Filled 2023-07-31 (×6): qty 1

## 2023-07-31 MED ORDER — PANCRELIPASE (LIP-PROT-AMYL) 12000-38000 UNITS PO CPEP: 36000.0000 [IU] | ORAL_CAPSULE | Freq: Three times a day (TID) | ORAL | Status: DC | PRN

## 2023-07-31 MED ORDER — LACTATED RINGERS IV BOLUS (SEPSIS)
500.0000 mL | Freq: Once | INTRAVENOUS | Status: AC
  Administered 2023-07-31: 500 mL via INTRAVENOUS

## 2023-07-31 MED ORDER — TIZANIDINE HCL 4 MG PO TABS
2.0000 mg | ORAL_TABLET | Freq: Three times a day (TID) | ORAL | Status: DC | PRN
  Administered 2023-08-02 – 2023-08-03 (×2): 2 mg via ORAL
  Filled 2023-07-31 (×2): qty 1

## 2023-07-31 MED ORDER — CARBIDOPA-LEVODOPA ER 50-200 MG PO TBCR
1.0000 | EXTENDED_RELEASE_TABLET | Freq: Every day | ORAL | Status: DC
  Administered 2023-07-31 – 2023-08-02 (×3): 1 via ORAL
  Filled 2023-07-31 (×3): qty 1

## 2023-07-31 MED ORDER — ACETAMINOPHEN 325 MG PO TABS: 650.0000 mg | ORAL_TABLET | Freq: Four times a day (QID) | ORAL | Status: DC | PRN

## 2023-07-31 MED ORDER — ONDANSETRON HCL 4 MG PO TABS: 4.0000 mg | ORAL_TABLET | Freq: Four times a day (QID) | ORAL | Status: DC | PRN

## 2023-07-31 MED ORDER — CARBIDOPA-LEVODOPA 25-100 MG PO TABS
2.0000 | ORAL_TABLET | ORAL | Status: DC
  Administered 2023-08-01 – 2023-08-03 (×5): 2 via ORAL
  Filled 2023-07-31 (×6): qty 2

## 2023-07-31 MED ORDER — ROSUVASTATIN CALCIUM 10 MG PO TABS
10.0000 mg | ORAL_TABLET | Freq: Every day | ORAL | Status: DC
  Administered 2023-08-01 – 2023-08-02 (×2): 10 mg via ORAL
  Filled 2023-07-31 (×2): qty 1

## 2023-07-31 MED ORDER — SODIUM CHLORIDE 0.9 % IV SOLN
2.0000 g | Freq: Two times a day (BID) | INTRAVENOUS | Status: DC
  Administered 2023-07-31 – 2023-08-03 (×6): 2 g via INTRAVENOUS
  Filled 2023-07-31 (×6): qty 12.5

## 2023-07-31 MED ORDER — LACTATED RINGERS IV BOLUS
500.0000 mL | Freq: Once | INTRAVENOUS | Status: AC
  Administered 2023-07-31: 500 mL via INTRAVENOUS

## 2023-07-31 MED ORDER — ACETAMINOPHEN 500 MG PO TABS
1000.0000 mg | ORAL_TABLET | Freq: Once | ORAL | Status: AC
  Administered 2023-07-31: 1000 mg via ORAL
  Filled 2023-07-31: qty 2

## 2023-07-31 MED ORDER — OXYCODONE HCL 5 MG PO TABS
5.0000 mg | ORAL_TABLET | Freq: Four times a day (QID) | ORAL | Status: DC | PRN
  Administered 2023-07-31 – 2023-08-02 (×2): 5 mg via ORAL
  Filled 2023-07-31 (×2): qty 1

## 2023-07-31 MED ORDER — PANCRELIPASE (LIP-PROT-AMYL) 36000-114000 UNITS PO CPEP
72000.0000 [IU] | ORAL_CAPSULE | Freq: Three times a day (TID) | ORAL | Status: DC
  Administered 2023-08-01 – 2023-08-03 (×8): 72000 [IU] via ORAL
  Filled 2023-07-31 (×9): qty 2

## 2023-07-31 MED ORDER — ONDANSETRON HCL 4 MG/2ML IJ SOLN: 4.0000 mg | Freq: Four times a day (QID) | INTRAMUSCULAR | Status: DC | PRN

## 2023-07-31 MED ORDER — VANCOMYCIN HCL 1250 MG/250ML IV SOLN
1250.0000 mg | INTRAVENOUS | Status: DC
  Administered 2023-08-01 – 2023-08-03 (×3): 1250 mg via INTRAVENOUS
  Filled 2023-07-31 (×3): qty 250

## 2023-07-31 MED ORDER — ACETAMINOPHEN 650 MG RE SUPP: 650.0000 mg | Freq: Four times a day (QID) | RECTAL | Status: DC | PRN

## 2023-07-31 MED ORDER — CLONAZEPAM 0.5 MG PO TABS
0.5000 mg | ORAL_TABLET | Freq: Every day | ORAL | Status: DC
  Administered 2023-07-31 – 2023-08-02 (×3): 0.5 mg via ORAL
  Filled 2023-07-31 (×3): qty 1

## 2023-07-31 MED ORDER — APIXABAN 5 MG PO TABS
5.0000 mg | ORAL_TABLET | Freq: Two times a day (BID) | ORAL | Status: DC
Start: 2023-07-31 — End: 2023-08-03
  Administered 2023-07-31 – 2023-08-03 (×6): 5 mg via ORAL
  Filled 2023-07-31 (×6): qty 1

## 2023-07-31 MED ORDER — DEXAMETHASONE 4 MG PO TABS
4.0000 mg | ORAL_TABLET | Freq: Once | ORAL | Status: AC
  Administered 2023-07-31: 4 mg via ORAL
  Filled 2023-07-31: qty 1

## 2023-07-31 MED ORDER — PANTOPRAZOLE SODIUM 40 MG PO TBEC
40.0000 mg | DELAYED_RELEASE_TABLET | Freq: Every day | ORAL | Status: DC
  Administered 2023-08-01 – 2023-08-03 (×3): 40 mg via ORAL
  Filled 2023-07-31 (×3): qty 1

## 2023-07-31 MED ORDER — SODIUM CHLORIDE 0.9 % IV SOLN
2.0000 g | Freq: Once | INTRAVENOUS | Status: AC
  Administered 2023-07-31: 2 g via INTRAVENOUS
  Filled 2023-07-31: qty 12.5

## 2023-07-31 MED ORDER — POLYETHYLENE GLYCOL 3350 17 G PO PACK
17.0000 g | PACK | Freq: Three times a day (TID) | ORAL | Status: DC
  Administered 2023-08-01 – 2023-08-03 (×4): 17 g via ORAL
  Filled 2023-07-31 (×4): qty 1

## 2023-07-31 NOTE — Assessment & Plan Note (Addendum)
82 year old male with history of pancreatic cancer and new lung nodules suspicious for metastastic disease who recently underwent port placement by IR on 11/20 and subsequent chemo infusion on 11/21 of gemcitabine and abraxane with subsequent erythema around port and over right anterior chest wall  -obs to progressive -initially presented with hypotension with no other SIRS/sepsis criteria, normal lactate. Responded to 1L IVF -concern for possibly cellulitis and vanc/cefepime started -exam shows significant and angry appearing erythema of port site and right chest wall, but also has erythematous macules over entire trunk/arms suspicious for more of an infusion reaction then infectious -discussed with on call oncology who said possible reaction of the gemcitabine with hx of radiation. Will give one dose of oral decadron and assess response in AM. May need more steroids if deemed more infusion related. Will send note to Dr. Mosetta Putt his primary oncologist as well  -does have hx of rejecting port placement a year ago, but does not recall this type of reaction  -trend fever curve and CBC  -does have hx of soft bp with parameters to hold his cardizem if systolic <100, blood pressures have been normal  -continue cefepime/vanc for now until cellulitis ruled out

## 2023-07-31 NOTE — ED Notes (Signed)
Report given to [facility].

## 2023-07-31 NOTE — ED Notes (Signed)
Report given to the Floor RN. 

## 2023-07-31 NOTE — ED Notes (Signed)
This RN assisted patient with a urine collection. Patient repositioned in bed.

## 2023-07-31 NOTE — Assessment & Plan Note (Signed)
recurrent prostate cancer undergoing bi-weekly chemotherapy

## 2023-07-31 NOTE — Progress Notes (Signed)
Plan of Care Note for accepted transfer   Patient: [name] [name] MRN: [id number]   DOA: 07/31/2023  [facility] requesting transfer: [facility] Requesting Provider: Dr. Donnald Garre  Reason for transfer: sepsis  Facility course: 82 year old male with PMHx significant for OSA on bpiap, prostate cancer with mets to bone, pancreatic cancer, PD, atrial flutter on eliquis, BPH, lung nodules who presented to ED with complaints of possible port infection.  He had a port placed Wednesday, 11/20 by IR for recent PET scan that showed a suspicious lung nodule, likely metastatic. Biopsy revealed atypical cells, highly suspicious for cancer.  It started to get red and continued to get worse so came to ED.   He has hx of previous port infection.   Vitals: afebrile, bP: 80/52, HR: 60, RR: 15, oxygen: 96%RA Pertinent labs: hgb: 11, lipase: 69,  UA wnl  CXR: wnl  In ED: BC obtained. Given 1L IVF bolus and cefepime and vanc. [facility] asked to admit.    Plan of care: The patient is accepted for admission to Progressive unit, at [facility].. Continue antibiotics, f/u on cultures. Consult oncology tomorrow if needed. Unsure if reaction from chemo vs. Infectious.    Author: Orland Mustard, MD 07/31/2023  Check [email or web address] for on-call coverage.  Nursing staff, Please call [facility] Admits & Consults System-Wide number on Amion as soon as patient's arrival, so appropriate admitting provider can evaluate the pt.

## 2023-07-31 NOTE — Assessment & Plan Note (Signed)
Bipap at bedtime

## 2023-07-31 NOTE — Assessment & Plan Note (Signed)
Continue sinemet CR  Fall precautions  Continue bowel regimen

## 2023-07-31 NOTE — ED Notes (Signed)
Pt unable to provide a urine sample at this time.

## 2023-07-31 NOTE — Assessment & Plan Note (Signed)
-  diagnosed in 09/2021, deemed poor surgical candidate due to his age and medical co-morbidities -S/p single agent gemcitabine 01/13/22 - 04/01/22 and 5 fx SBRT [name]) 05/04/22 - 05/14/22. CA 19-9 decreased after treatment -CT scan from May 17, 2023 showed multiple lung nodules, with dominant 1.5 cm nodule in the right middle lobe, highly concerning for metastasis.  His lung lesions are positive on PET scan. -Bronchoscopy and biopsy attempted, which showed atypical cells. -He restarted chemo gemcitabine and abraxane on 07/29/2023 -followed by Dr. Mosetta Putt

## 2023-07-31 NOTE — H&P (Addendum)
History and Physical    Patient: [name] [name] [id number]:[id number] DOB: 21-Jul-1941 DOA: 07/31/2023 DOS: the patient was seen and examined on 07/31/2023 PCP: Lorenda Ishihara, MD  Patient coming from:  [facility]  - lives [place]. Uses walker.    Chief Complaint: skin infection   HPI: [name] [name] is a 82 y.o. male with medical history significant of  OSA on bpiap, prostate cancer with mets to bone, pancreatic cancer, PD, atrial flutter on eliquis, BPH, lung nodules who presented to ED with complaints of possible port infection. He had a port placed Wednesday, 11/20 by IR for recent PET scan that showed a suspicious lung nodule, likely metastatic. Biopsy revealed atypical cells, highly suspicious for cancer. He got his chemo infusion on Thursday.  He states he noticed the redness around his port site Thursday night. He doesn't think the redness has spread, but since it persisted he came to ED today. He has had no fever/chills. He has been eating and drinking well. He does feel like he has been in atrial fib for a few days.    They tell me he had a port about one year ago and he had to have it removed due to his body rejecting it.  His son states he thinks it came out the next day, but doesn't remember it being red on his skin.    Denies any fever/chills, vision changes/headaches, chest pain or palpitations, shortness of breath or cough, N/V/D, dysuria or leg swelling.    He does not smoke or drink alcohol daily.   ER Course:  Vitals: afebrile, bP: 80/52, HR: 60, RR: 15, oxygen: 96%RA Pertinent labs: hgb: 11, lipase: 69,  UA wnl  CXR: wnl  In ED: BC obtained. Given 1L IVF bolus and cefepime and vanc. [facility] asked to admit.   Review of Systems: As mentioned in the history of present illness. All other systems reviewed and are negative. 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)    Prostate cancer metastatic to bone (HCC)    REM sleep behavior disorder    Sleep apnea    BiPap - uses nightly   Past Surgical History:  Procedure Laterality Date   ANTERIOR APPROACH HEMI HIP ARTHROPLASTY Right 02/05/2023   Procedure: ANTERIOR APPROACH HEMI HIP ARTHROPLASTY;  Surgeon: Jodi Geralds, MD;  Location: [facility] OR;  Service: Orthopedics;  Laterality: Right;   APPENDECTOMY  1963   BIOPSY  12/25/2021   Procedure: BIOPSY;  Surgeon: Meridee Score Netty Starring., MD;  Location: [facility] ENDOSCOPY;  Service: Gastroenterology;;   BRONCHIAL BIOPSY  07/05/2023   Procedure: BRONCHIAL BIOPSIES;  Surgeon: Josephine Igo, DO;  Location: [facility] ENDOSCOPY;  Service: Pulmonary;;   COLONOSCOPY  2010, 2015   ESOPHAGOGASTRODUODENOSCOPY N/A 04/16/2022   Procedure: ESOPHAGOGASTRODUODENOSCOPY (EGD);  Surgeon: Lemar Lofty., MD;  Location: [facility] ENDOSCOPY;  Service: Gastroenterology;  Laterality: N/A;   ESOPHAGOGASTRODUODENOSCOPY (EGD) WITH PROPOFOL N/A 12/25/2021   Procedure: ESOPHAGOGASTRODUODENOSCOPY (EGD) WITH PROPOFOL;  Surgeon: Meridee Score Netty Starring., MD;  Location: [facility] ENDOSCOPY;  Service: Gastroenterology;  Laterality: N/A;   EUS N/A 12/25/2021   Procedure: UPPER ENDOSCOPIC ULTRASOUND (EUS) RADIAL;  Surgeon: Lemar Lofty., MD;  Location: [facility] ENDOSCOPY;  Service: Gastroenterology;  Laterality: N/A;  EUS N/A 04/16/2022   Procedure: UPPER ENDOSCOPIC ULTRASOUND (EUS) RADIAL;  Surgeon: Lemar Lofty., MD;  Location: [facility] ENDOSCOPY;  Service: Gastroenterology;  Laterality: N/A;   FIDUCIAL MARKER PLACEMENT N/A 04/16/2022   Procedure: FIDUCIAL MARKER PLACEMENT;  Surgeon: Lemar Lofty., MD;  Location: [facility] ENDOSCOPY;  Service: Gastroenterology;  Laterality: N/A;   FINE NEEDLE ASPIRATION  12/25/2021   Procedure: FINE NEEDLE ASPIRATION (FNA) LINEAR;   Surgeon: Lemar Lofty., MD;  Location: [facility] ENDOSCOPY;  Service: Gastroenterology;;   IR IMAGING GUIDED PORT INSERTION  07/28/2023   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   KYPHOPLASTY N/A 08/15/2020   Procedure: L2 compression fracture;  Surgeon: Kennedy Bucker, MD;  Location: [facility] ORS;  Service: Orthopedics;  Laterality: N/A;   KYPHOPLASTY N/A 08/29/2020   Procedure: T8 KYPHOPLASTY;  Surgeon: Kennedy Bucker, MD;  Location: [facility] ORS;  Service: Orthopedics;  Laterality: N/A;   KYPHOPLASTY N/A 11/12/2020   Procedure: L1 KYPHOPLASTY;  Surgeon: Kennedy Bucker, MD;  Location: [facility] ORS;  Service: Orthopedics;  Laterality: N/A;   PORTACATH PLACEMENT N/A 02/05/2022   Procedure: INSERTION PORT-A-CATH;  Surgeon: Fritzi Mandes, MD;  Location: [facility] ORS;  Service: General;  Laterality: N/A;   Social History:  reports that he quit smoking about 12 years ago. His smoking use included cigars. He has never used smokeless tobacco. He reports that he does not currently use alcohol after a past usage of about 4.0 standard drinks of alcohol per week. He reports that he does not use drugs.  Allergies  Allergen Reactions   Influenza Vaccines Anaphylaxis    Flu shot: 1974 anaphylaxis. 2nd time swollen arm   Influenza Virus Vaccine Other (See Comments)    Family History  Problem Relation Age of Onset   Heart disease Maternal Grandfather    Diabetes Paternal Grandfather    Breast cancer Daughter    Colon cancer Neg Hx    Esophageal cancer Neg Hx    Stomach cancer Neg Hx     Prior to Admission medications   Medication Sig Start Date End Date Taking? Authorizing Provider  acetaminophen (TYLENOL) 500 MG tablet Take 1 tablet (500 mg total) by mouth every 6 (six) hours as needed for moderate pain or mild pain. 04/18/22   Mansouraty, Netty Starring., MD  ammonium lactate (LAC-HYDRIN) 12 % lotion Apply 1 Application topically as needed. 06/22/23   [provider]  bisacodyl (DULCOLAX) 10 MG suppository Place 1  suppository (10 mg total) rectally daily as needed for moderate constipation. 02/09/23   Sheikh, Kateri Mc Latif, DO  carbidopa-levodopa (SINEMET CR) 50-200 MG tablet Take 1 tablet by mouth at bedtime. 07/13/23   Tat, Octaviano Batty, DO  carbidopa-levodopa (SINEMET IR) 25-100 MG tablet Take 3 tablets at 7 AM, 2 tablets at 11 AM, 3 tablets at 3 PM, 2 tablets at 7 PM 04/28/23   Tat, Rebecca S, DO  ciclopirox (PENLAC) 8 % solution Apply 1 Application topically at bedtime.  09/29/24  [provider]  clonazePAM (KLONOPIN) 0.5 MG tablet Take 1 tablet (0.5 mg total) by mouth at bedtime. 02/09/23   Marguerita Merles Latif, DO  cyanocobalamin (VITAMIN B12) 1000 MCG tablet Take 1,000 mcg by mouth daily.    [provider]  diltiazem (CARDIZEM) 30 MG tablet Take 1 tablet (30 mg total) by mouth 2 (two) times daily. Do not take if systolic blood pressure (top number) is less than 100, or if pulse if less than 55. 06/16/23   Tolia, Sunit, DO  ELIQUIS 5 MG TABS  tablet TAKE 1 TABLET TWICE A DAY 03/12/23   Tolia, Sunit, DO  Glycerin-Hypromellose-PEG 400 (VISINE DRY EYE OP) Place 1 drop into both eyes 2 (two) times daily as needed (eye irritation).     [provider]  ketoconazole (NIZORAL) 2 % shampoo Apply 1 application  topically 3 (three) times a week. 02/15/19   [provider]  leuprolide, 6 Month, (ELIGARD) 45 MG injection Inject 45 mg into the skin every 6 (six) months.    [provider]  omeprazole (PRILOSEC) 20 MG capsule Take 1 capsule (20 mg total) by mouth daily. 04/20/23   Malachy Mood, MD  ondansetron (ZOFRAN) 4 MG tablet Take 1 tablet (4 mg total) by mouth every 6 (six) hours as needed for nausea. 02/09/23   Marguerita Merles Latif, DO  oxyCODONE (OXY IR/ROXICODONE) 5 MG immediate release tablet Take 1 tablet (5 mg total) by mouth every 6 (six) hours as needed for severe pain (pain score 7-10). 07/28/23   Malachy Mood, MD  PANCREAZE [phone] units CPEP TAKE 2 CAPSULES WITH BREAKFAST, LUNCH,  AND EVENING MEAL (WITH EACH MEAL) AND 1 CAPSULE WITH SNACK 03/12/23   Arnaldo Natal, NP  polyethylene glycol powder (MIRALAX) 17 GM/SCOOP powder Take 17 g by mouth in the morning, at noon, and at bedtime.    [provider]  prochlorperazine (COMPAZINE) 10 MG tablet Take 1 tablet (10 mg total) by mouth every 6 (six) hours as needed for nausea or vomiting. 07/29/23   Malachy Mood, MD  rosuvastatin (CRESTOR) 10 MG tablet TAKE 1 TABLET DAILY 07/08/23   Tolia, Sunit, DO  tiZANidine (ZANAFLEX) 2 MG tablet Take 1 tablet (2 mg total) by mouth every 8 (eight) hours as needed for muscle spasms. 02/09/23   Marguerita Merles Latif, DO  triamcinolone cream (KENALOG) 0.1 % Apply 1 application topically 2 (two) times daily. Patient taking differently: Apply 1 application  topically 2 (two) times daily as needed (itching). 01/16/21   Duanne Limerick, MD  zoledronic acid (RECLAST) 5 MG/100ML SOLN injection Inject 5 mg into the vein See admin instructions. Once a year    [provider]    Physical Exam: Vitals:   07/31/23 1615 07/31/23 1650 07/31/23 1733 07/31/23 1734  BP: 106/89  130/80   Pulse: (!) 111  (!) 113 (!) 110  Resp: (!) 21  20   Temp:  99.7 F (37.6 C) 98.6 F (37 C)   TempSrc:  Oral Oral   SpO2: 94%  96% 95%  Weight:    73.2 kg  Height:    5\' 5"  (1.651 m)   General:  Appears calm and comfortable and is in NAD. Resting tremor.  Eyes:  PERRL, EOMI, normal lids, iris ENT:  grossly normal hearing, lips & tongue, mmm; appropriate dentition Neck:  no LAD, masses or thyromegaly; no carotid bruits Cardiovascular:  irregularly, irregular, no m/r/g. No LE edema.  Respiratory:   quiet crackles in LLL otherwise normal exam. Effort normal.  Abdomen:  soft, NT, ND, NABS Back:   normal alignment, no CVAT Skin:  erythema surrounding port up neck and down chest over right pectoralis/chest wall to above the nipple. He has some erythematous macules all over trunk, upper extremities. Port  site with no drainage   Musculoskeletal:  grossly normal tone BUE/BLE, good ROM, no bony abnormality Lower extremity:  No LE edema.  Limited foot exam with no ulcerations.  2+ distal pulses. Psychiatric:  grossly normal mood and affect, speech flat and appropriate, AOx3  Neurologic:  CN 2-12 grossly intact, moves all extremities in coordinated fashion, sensation intact. Resting tremor    Radiological Exams on Admission: Independently reviewed - see discussion in A/P where applicable  DG Chest Port 1 View  Result Date: 07/31/2023 CLINICAL DATA:  Questionable sepsis - evaluate for abnormality. EXAM: PORTABLE CHEST 1 VIEW COMPARISON:  07/05/2023. FINDINGS: Bilateral lung fields are clear. No acute consolidation or lung collapse. No pulmonary edema. Bilateral costophrenic angles are clear. Stable cardio-mediastinal silhouette. No acute osseous abnormalities. The soft tissues are within normal limits. Right-sided CT Port-A-Cath is seen with its tip overlying the upper portion of superior vena cava. IMPRESSION: *No active disease. Electronically Signed   By: [name] [name] M.D.   On: 07/31/2023 13:39    EKG: Independently reviewed.  NSR with rate 59; nonspecific ST changes with no evidence of acute ischemia. RBBB some new T wave inversion   Labs on Admission: I have personally reviewed the available labs and imaging studies at the time of the admission.  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 Responded to 1L IVF -concern for possibly cellulitis and vanc/cefepime started -exam shows significant and angry appearing erythema of port site and right chest wall, but also has erythematous macules over entire trunk/arms suspicious for more of an infusion reaction then infectious -discussed with on call oncology who said possible reaction of the gemcitabine with hx of radiation. Will give one dose of oral decadron and assess response in AM. May need more steroids if deemed more infusion related. Will send note to Dr. Mosetta Putt his primary oncologist as well  -does have hx of rejecting port placement a year ago, but does not recall this type of reaction  -trend fever curve and CBC  -does have hx of soft bp with parameters to hold his cardizem if systolic <100, blood pressures have been normal  -continue cefepime/vanc for now until cellulitis ruled out   A-fib [facility]) Currently in atrial fibrillation, rate controlled. States he thinks he has been in afib for 4 days  Has hx of soft bp with him holding his cardizem. On 30mg  BID. Blood pressures have been stable with last reading 130/80, now that  he is in atrial fib will give his cardizem. Parameters to hold if systolic <100   Continue eliquis  Had an ED visit on 05/09/2023 due to symptomatic atrial fibrillation and underwent direct-current cardioversion with 150 J x 1 and converted to sinus rhythm.  Pancreatic cancer [facility]) -diagnosed in 09/2021, deemed poor surgical candidate due to his age and medical co-morbidities -S/p single agent gemcitabine 01/13/22 - 04/01/22 and 5 fx SBRT [name] [name]) 05/04/22 - 05/14/22. CA 19-9 decreased after treatment -CT scan from May 17, 2023 showed multiple lung nodules, with dominant 1.5 cm nodule in the right middle lobe,  highly concerning for  metastasis.  His lung lesions are positive on PET scan. -Bronchoscopy and biopsy attempted, which showed atypical cells. -He restarted chemo gemcitabine and abraxane on 07/29/2023 -followed by Dr. Mosetta Putt   Parkinson's disease [facility]) Continue sinemet CR  Fall precautions  Continue bowel regimen   Prostate cancer metastatic to bone [facility]) recurrent prostate cancer undergoing bi-weekly chemotherapy   Sleep apnea treated with nocturnal BiPAP Bipap at bedtime      Advance Care Planning:   Code Status: Full Code full code   Consults: none at admit ([place]   DVT Prophylaxis: eliquis   Family Communication: son at bedside   Severity of Illness: The appropriate patient status for this patient is OBSERVATION. Observation status is judged to be reasonable and necessary in order to provide the required intensity of service to ensure the patient's safety. The patient's presenting symptoms, physical exam findings, and initial radiographic and laboratory data in the context of their medical condition is felt to place them at decreased risk for further clinical deterioration. Furthermore, it is anticipated that the patient will be medically stable for discharge from the hospital within 2 midnights of admission.   Author: Orland Mustard, MD 07/31/2023 7:18 PM  For on call review [email or web address].

## 2023-07-31 NOTE — ED Notes (Signed)
[facility] called, ready for transport, pt bed assignment is ready

## 2023-07-31 NOTE — Assessment & Plan Note (Addendum)
Currently in atrial fibrillation, rate controlled. States he thinks he has been in afib for 4 days  Has hx of soft bp with him holding his cardizem. On 30mg  BID. Blood pressures have been stable with last reading 130/80, now that  he is in atrial fib will give his cardizem. Parameters to hold if systolic <100   Continue eliquis  Had an ED visit on 05/09/2023 due to symptomatic atrial fibrillation and underwent direct-current cardioversion with 150 J x 1 and converted to sinus rhythm.

## 2023-07-31 NOTE — Progress Notes (Addendum)
Pharmacy Antibiotic Note  [name] [name] is a 82 y.o. male admitted on 07/31/2023 with complaints of possible port infection. States he had port placed 11/20; it started to get red and he came to the ED. Pharmacy has been consulted for cefepime and vancomycin dosing for cellulitis in immunocompromised individual.  Plan: -Cefepime 2 g IV q12h -Vancomycin 1 g given ~ 1400; follow with vancomycin 1250 mg IV q24h to start tomorrow at 1000 -Continue to follow renal function, cultures and clinical progress for dose adjustments and de-escalation as indicated  Height: 5\' 5"  (165.1 cm) Weight: 73.2 kg (161 lb 6 oz) IBW/kg (Calculated) : 61.5  Temp (24hrs), Avg:98.4 F (36.9 C), Min:97.5 F (36.4 C), Max:99.7 F (37.6 C)  Recent Labs  Lab 07/29/23 0755 07/31/23 1300 07/31/23 1323 07/31/23 1549  WBC 4.6 5.5  --   --   CREATININE 1.02 1.01  --   --   LATICACIDVEN  --   --  0.7 1.0    Estimated Creatinine Clearance: 49.9 mL/min (by C-G formula based on SCr of 1.01 mg/dL).    Allergies  Allergen Reactions   Influenza Vaccines Anaphylaxis    Flu shot: 1974 anaphylaxis. 2nd time swollen arm   Influenza Virus Vaccine Other (See Comments)    Antimicrobials this admission: Cefepime 11/23 >>  Dose adjustments this admission: NA  Microbiology results: 11/23 BCx: pending   Thank you for allowing pharmacy to be a part of this patient's care.  Pricilla Riffle, PharmD, BCPS Clinical Pharmacist 07/31/2023 6:46 PM

## 2023-07-31 NOTE — ED Notes (Signed)
Pt aware of the need for a urine... Unable to currently provide the sample... 

## 2023-07-31 NOTE — ED Notes (Signed)
Provider aware of the V/S.Marland KitchenMarland Kitchen

## 2023-07-31 NOTE — ED Provider Notes (Signed)
[place] EMERGENCY DEPARTMENT AT [facility] Provider Note   CSN: [id number] Arrival date & time: 07/31/23  1137     History  Chief Complaint  Patient presents with   Rash    [name] [name] is a 82 y.o. male.  HPI Patient reports he had his port replaced 11/20 at the [facility] by Dr. Archer Asa, IR.  Patient had previously had an adverse reaction to his port and this was a second replacement.  Patient reports that yesterday he started to get up a lot of redness over the site and it has expanded over much of his right anterior chest today.  He has not had a fever that he is aware of.  Patient reports that he has felt a little more generally weak.  He has not had other focal symptoms.  He has not had loss of appetite.  He was able to eat a normal breakfast this morning with no nausea no vomiting.  Reports when he took his blood pressure earlier in the morning his systolic blood pressure was about 117.  He reports blood pressures were normal yesterday.  Today he has felt lightheaded a couple times and felt close to passing out when he walked in from the parking lot.  He reports that the area around the port is a little uncomfortable but not severely painful.    Home Medications Prior to Admission medications   Medication Sig Start Date End Date Taking? Authorizing Provider  acetaminophen (TYLENOL) 500 MG tablet Take 1 tablet (500 mg total) by mouth every 6 (six) hours as needed for moderate pain or mild pain. 04/18/22   Mansouraty, Netty Starring., MD  ammonium lactate (LAC-HYDRIN) 12 % lotion Apply 1 Application topically as needed. 06/22/23   [provider]  bisacodyl (DULCOLAX) 10 MG suppository Place 1 suppository (10 mg total) rectally daily as needed for moderate constipation. 02/09/23   Sheikh, Kateri Mc Latif, DO  carbidopa-levodopa (SINEMET CR) 50-200 MG tablet Take 1 tablet by mouth at bedtime. 07/13/23   Tat, Octaviano Batty, DO  carbidopa-levodopa (SINEMET  IR) 25-100 MG tablet Take 3 tablets at 7 AM, 2 tablets at 11 AM, 3 tablets at 3 PM, 2 tablets at 7 PM 04/28/23   Tat, Rebecca S, DO  ciclopirox (PENLAC) 8 % solution Apply 1 Application topically at bedtime.  09/29/24  [provider]  clonazePAM (KLONOPIN) 0.5 MG tablet Take 1 tablet (0.5 mg total) by mouth at bedtime. 02/09/23   Marguerita Merles Latif, DO  cyanocobalamin (VITAMIN B12) 1000 MCG tablet Take 1,000 mcg by mouth daily.    [provider]  diltiazem (CARDIZEM) 30 MG tablet Take 1 tablet (30 mg total) by mouth 2 (two) times daily. Do not take if systolic blood pressure (top number) is less than 100, or if pulse if less than 55. 06/16/23   Tolia, Sunit, DO  ELIQUIS 5 MG TABS tablet TAKE 1 TABLET TWICE A DAY 03/12/23   Tolia, Sunit, DO  Glycerin-Hypromellose-PEG 400 (VISINE DRY EYE OP) Place 1 drop into both eyes 2 (two) times daily as needed (eye irritation).     [provider]  ketoconazole (NIZORAL) 2 % shampoo Apply 1 application  topically 3 (three) times a week. 02/15/19   [provider]  leuprolide, 6 Month, (ELIGARD) 45 MG injection Inject 45 mg into the skin every 6 (six) months.    [provider]  omeprazole (PRILOSEC) 20 MG capsule Take 1 capsule (20 mg total) by mouth  daily. 04/20/23   Malachy Mood, MD  ondansetron (ZOFRAN) 4 MG tablet Take 1 tablet (4 mg total) by mouth every 6 (six) hours as needed for nausea. 02/09/23   Marguerita Merles Latif, DO  oxyCODONE (OXY IR/ROXICODONE) 5 MG immediate release tablet Take 1 tablet (5 mg total) by mouth every 6 (six) hours as needed for severe pain (pain score 7-10). 07/28/23   Malachy Mood, MD  PANCREAZE [phone] units CPEP TAKE 2 CAPSULES WITH BREAKFAST, LUNCH, AND EVENING MEAL (WITH EACH MEAL) AND 1 CAPSULE WITH SNACK 03/12/23   Arnaldo Natal, NP  polyethylene glycol powder (MIRALAX) 17 GM/SCOOP powder Take 17 g by mouth in the morning, at noon, and at bedtime.    [provider]   prochlorperazine (COMPAZINE) 10 MG tablet Take 1 tablet (10 mg total) by mouth every 6 (six) hours as needed for nausea or vomiting. 07/29/23   Malachy Mood, MD  rosuvastatin (CRESTOR) 10 MG tablet TAKE 1 TABLET DAILY 07/08/23   Tolia, Sunit, DO  tiZANidine (ZANAFLEX) 2 MG tablet Take 1 tablet (2 mg total) by mouth every 8 (eight) hours as needed for muscle spasms. 02/09/23   Marguerita Merles Latif, DO  triamcinolone cream (KENALOG) 0.1 % Apply 1 application topically 2 (two) times daily. Patient taking differently: Apply 1 application  topically 2 (two) times daily as needed (itching). 01/16/21   Duanne Limerick, MD  zoledronic acid (RECLAST) 5 MG/100ML SOLN injection Inject 5 mg into the vein See admin instructions. Once a year    [provider]      Allergies    Influenza vaccines and Influenza virus vaccine    Review of Systems   Review of Systems  Physical Exam Updated Vital Signs BP (!) 122/98   Pulse 63   Temp 97.9 F (36.6 C) (Oral)   Resp 20   SpO2 95%  Physical Exam Constitutional:      Comments: Alert with clear mental status.  No respiratory distress.  Patient is pale and thin in appearance.  HENT:     Mouth/Throat:     Pharynx: Oropharynx is clear.  Eyes:     Extraocular Movements: Extraocular movements intact.     Pupils: Pupils are equal, round, and reactive to light.  Cardiovascular:     Rate and Rhythm: Normal rate and regular rhythm.  Pulmonary:     Comments: No respiratory distress.  Patient does have some crackles lower lung fields bilaterally. Abdominal:     Palpations: Abdomen is soft.     Comments: Soft.  Mildly protuberant and distended but nontender.  Musculoskeletal:     Comments: No peripheral edema.  Calves are soft and nontender.  Lower legs and feet are in good condition without any wounds or swelling.  Both warm and dry to the touch.  Skin:    General: Skin is warm and dry.     Coloration: Skin is pale.     Findings: Rash present.      Comments: Patient has extensive erythematous plaque on the right anterior chest in the area of port insertions.  See attached images.  Neurological:     General: No focal deficit present.     Mental Status: He is oriented to person, place, and time.     Coordination: Coordination normal.     Comments: No focal weakness.  Psychiatric:        Mood and Affect: Mood normal.     ED Results / Procedures / Treatments   Labs (  all labs ordered are listed, but only abnormal results are displayed) Labs Reviewed  COMPREHENSIVE METABOLIC PANEL - Abnormal; Notable for the following components:      Result Value   Glucose, Bld 121 (*)    BUN 25 (*)    Total Bilirubin 1.9 (*)    All other components within normal limits  CBC WITH DIFFERENTIAL/PLATELET - Abnormal; Notable for the following components:   RBC 3.66 (*)    Hemoglobin 11.0 (*)    HCT 33.9 (*)    Platelets 137 (*)    Lymphs Abs 0.6 (*)    All other components within normal limits  PROTIME-INR - Abnormal; Notable for the following components:   Prothrombin Time 16.8 (*)    INR 1.3 (*)    All other components within normal limits  LIPASE, BLOOD - Abnormal; Notable for the following components:   Lipase 69 (*)    All other components within normal limits  CULTURE, BLOOD (ROUTINE X 2)  CULTURE, BLOOD (ROUTINE X 2)  LACTIC ACID, PLASMA  APTT  URINALYSIS, W/ REFLEX TO CULTURE (INFECTION SUSPECTED)  LACTIC ACID, PLASMA    EKG EKG Interpretation Date/Time:  Saturday July 31 2023 11:53:35 EST Ventricular Rate:  59 PR Interval:  176 QRS Duration:  126 QT Interval:  458 QTC Calculation: 453 R Axis:   11  Text Interpretation: Sinus bradycardia Right bundle branch block T wave abnormality, consider inferior ischemia Abnormal ECG When compared with ECG of 16-Jun-2023 14:57, PR interval has decreased no sig change from previous Confirmed by Arby Barrette [phone]) on 07/31/2023 1:32:45 PM  Radiology DG Chest Port 1  View  Result Date: 07/31/2023 CLINICAL DATA:  Questionable sepsis - evaluate for abnormality. EXAM: PORTABLE CHEST 1 VIEW COMPARISON:  07/05/2023. FINDINGS: Bilateral lung fields are clear. No acute consolidation or lung collapse. No pulmonary edema. Bilateral costophrenic angles are clear. Stable cardio-mediastinal silhouette. No acute osseous abnormalities. The soft tissues are within normal limits. Right-sided CT Port-A-Cath is seen with its tip overlying the upper portion of superior vena cava. IMPRESSION: *No active disease. Electronically Signed   By: Jules Schick M.D.   On: 07/31/2023 13:39    Procedures Procedures   CRITICAL CARE Performed by: Arby Barrette   Total critical care time: 30 minutes  Critical care time was exclusive of separately billable procedures and treating other patients.  Critical care was necessary to treat or prevent imminent or life-threatening deterioration.  Critical care was time spent personally by me on the following activities: development of treatment plan with patient and/or surrogate as well as nursing, discussions with consultants, evaluation of patient's response to treatment, examination of patient, obtaining history from patient or surrogate, ordering and performing treatments and interventions, ordering and review of laboratory studies, ordering and review of radiographic studies, pulse oximetry and re-evaluation of patient's condition.  Medications Ordered in ED Medications  lactated ringers bolus 500 mL (0 mLs Intravenous Stopped 07/31/23 1356)  ceFEPIme (MAXIPIME) 2 g in sodium chloride 0.9 % 100 mL IVPB (0 g Intravenous Stopped 07/31/23 1402)  vancomycin (VANCOCIN) IVPB 1000 mg/200 mL premix (1,000 mg Intravenous New Bag/Given 07/31/23 1355)  lactated ringers bolus 500 mL (0 mLs Intravenous Stopped 07/31/23 1436)    ED Course/ Medical Decision Making/ A&P                                 Medical Decision Making Amount and/or  Complexity of Data  Reviewed Labs: ordered. Radiology: ordered. ECG/medicine tests: ordered.  Risk Prescription drug management.   Patient is being treated by Dr. Mosetta Putt.  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  Patient is undergoing chemotherapy treatment and recently had his port replaced.  Reportedly he had had an adverse reaction of the prior port but possibly not infectious in nature.  He also has diagnosis of pancreatic cancer which had shown decreased size and no evidence of metastatic disease [phone] however per Dr. Latanya Maudlin note CT scan September 2024 shows multiple lung nodules concerning for metastases.  He restarted chemotherapy with gemcitabine and abraxane 11/21.  Patient is not completely sure if the redness around the port insertions started before the chemotherapies.  This time patient is hypotensive.  He is not tachycardic.  His mental status is clear.  He has no respiratory distress.  With extensive rash there is concern for possible sepsis and infection although hypersensitivity to port materials is also a consideration in light of his report of adverse reaction with the previous port.  Consult: Reviewed with Dr. Sheppard Penton [facility] for admission.  Patient's blood pressures have improved with volume resuscitation.  Mental status remains clear.  He has gotten a dose of cefepime and vancomycin.  This time stable for admission.        Final Clinical Impression(s) / ED Diagnoses Final diagnoses:  Sepsis, due to unspecified organism, unspecified whether acute organ dysfunction present (HCC)  Hypotension, unspecified hypotension type    Rx / DC Orders ED Discharge Orders     None         Arby Barrette, MD 07/31/23 1526

## 2023-07-31 NOTE — ED Triage Notes (Signed)
Pt reports area of redness across diffuse upper right chest since receiving a chemotherapy session yesterday via a new port that was placed on Wednesday.  Pt denies pain at site, but states the area feels "sensitive"

## 2023-08-01 ENCOUNTER — Other Ambulatory Visit: Payer: Self-pay

## 2023-08-01 ENCOUNTER — Encounter (HOSPITAL_COMMUNITY): Payer: Self-pay | Admitting: Family Medicine

## 2023-08-01 DIAGNOSIS — N400 Enlarged prostate without lower urinary tract symptoms: Secondary | ICD-10-CM | POA: Diagnosis not present

## 2023-08-01 DIAGNOSIS — Z923 Personal history of irradiation: Secondary | ICD-10-CM | POA: Diagnosis not present

## 2023-08-01 DIAGNOSIS — G20A1 Parkinson's disease without dyskinesia, without mention of fluctuations: Secondary | ICD-10-CM | POA: Diagnosis not present

## 2023-08-01 DIAGNOSIS — I480 Paroxysmal atrial fibrillation: Secondary | ICD-10-CM | POA: Diagnosis not present

## 2023-08-01 DIAGNOSIS — M810 Age-related osteoporosis without current pathological fracture: Secondary | ICD-10-CM | POA: Diagnosis present

## 2023-08-01 DIAGNOSIS — Z96641 Presence of right artificial hip joint: Secondary | ICD-10-CM | POA: Diagnosis present

## 2023-08-01 DIAGNOSIS — C7951 Secondary malignant neoplasm of bone: Secondary | ICD-10-CM | POA: Diagnosis not present

## 2023-08-01 DIAGNOSIS — R21 Rash and other nonspecific skin eruption: Secondary | ICD-10-CM

## 2023-08-01 DIAGNOSIS — H04123 Dry eye syndrome of bilateral lacrimal glands: Secondary | ICD-10-CM | POA: Diagnosis present

## 2023-08-01 DIAGNOSIS — Z9221 Personal history of antineoplastic chemotherapy: Secondary | ICD-10-CM | POA: Diagnosis not present

## 2023-08-01 DIAGNOSIS — I4892 Unspecified atrial flutter: Secondary | ICD-10-CM | POA: Diagnosis not present

## 2023-08-01 DIAGNOSIS — Z833 Family history of diabetes mellitus: Secondary | ICD-10-CM | POA: Diagnosis not present

## 2023-08-01 DIAGNOSIS — G8929 Other chronic pain: Secondary | ICD-10-CM | POA: Diagnosis not present

## 2023-08-01 DIAGNOSIS — C259 Malignant neoplasm of pancreas, unspecified: Secondary | ICD-10-CM | POA: Diagnosis not present

## 2023-08-01 DIAGNOSIS — Z7901 Long term (current) use of anticoagulants: Secondary | ICD-10-CM | POA: Diagnosis not present

## 2023-08-01 DIAGNOSIS — L0390 Cellulitis, unspecified: Secondary | ICD-10-CM | POA: Diagnosis not present

## 2023-08-01 DIAGNOSIS — Z79899 Other long term (current) drug therapy: Secondary | ICD-10-CM | POA: Diagnosis not present

## 2023-08-01 DIAGNOSIS — T80219A Unspecified infection due to central venous catheter, initial encounter: Secondary | ICD-10-CM | POA: Diagnosis not present

## 2023-08-01 DIAGNOSIS — Z803 Family history of malignant neoplasm of breast: Secondary | ICD-10-CM | POA: Diagnosis not present

## 2023-08-01 DIAGNOSIS — Y848 Other medical procedures as the cause of abnormal reaction of the patient, or of later complication, without mention of misadventure at the time of the procedure: Secondary | ICD-10-CM | POA: Diagnosis present

## 2023-08-01 DIAGNOSIS — Z887 Allergy status to serum and vaccine status: Secondary | ICD-10-CM | POA: Diagnosis not present

## 2023-08-01 DIAGNOSIS — Z87891 Personal history of nicotine dependence: Secondary | ICD-10-CM | POA: Diagnosis not present

## 2023-08-01 DIAGNOSIS — Z8249 Family history of ischemic heart disease and other diseases of the circulatory system: Secondary | ICD-10-CM | POA: Diagnosis not present

## 2023-08-01 DIAGNOSIS — G4733 Obstructive sleep apnea (adult) (pediatric): Secondary | ICD-10-CM | POA: Diagnosis not present

## 2023-08-01 DIAGNOSIS — Z8546 Personal history of malignant neoplasm of prostate: Secondary | ICD-10-CM | POA: Diagnosis not present

## 2023-08-01 DIAGNOSIS — L539 Erythematous condition, unspecified: Secondary | ICD-10-CM | POA: Diagnosis not present

## 2023-08-01 DIAGNOSIS — C61 Malignant neoplasm of prostate: Secondary | ICD-10-CM | POA: Diagnosis not present

## 2023-08-01 LAB — CBC
HCT: 36.3 % — ABNORMAL LOW (ref 39.0–52.0)
Hemoglobin: 11.5 g/dL — ABNORMAL LOW (ref 13.0–17.0)
MCH: 30 pg (ref 26.0–34.0)
MCHC: 31.7 g/dL (ref 30.0–36.0)
MCV: 94.8 fL (ref 80.0–100.0)
Platelets: 125 10*3/uL — ABNORMAL LOW (ref 150–400)
RBC: 3.83 MIL/uL — ABNORMAL LOW (ref 4.22–5.81)
RDW: 15 % (ref 11.5–15.5)
WBC: 8.8 10*3/uL (ref 4.0–10.5)
nRBC: 0 % (ref 0.0–0.2)

## 2023-08-01 LAB — BASIC METABOLIC PANEL
Anion gap: 7 (ref 5–15)
BUN: 18 mg/dL (ref 8–23)
CO2: 21 mmol/L — ABNORMAL LOW (ref 22–32)
Calcium: 8.8 mg/dL — ABNORMAL LOW (ref 8.9–10.3)
Chloride: 104 mmol/L (ref 98–111)
Creatinine, Ser: 0.85 mg/dL (ref 0.61–1.24)
GFR, Estimated: >60 mL/min (ref >60)
Glucose, Bld: 150 mg/dL — ABNORMAL HIGH (ref 70–99)
Potassium: 4.1 mmol/L (ref 3.5–5.1)
Sodium: 132 mmol/L — ABNORMAL LOW (ref 135–145)

## 2023-08-01 LAB — MRSA NEXT GEN BY PCR, NASAL: MRSA by PCR Next Gen: NOT DETECTED

## 2023-08-01 MED ORDER — DEXAMETHASONE 4 MG PO TABS
6.0000 mg | ORAL_TABLET | Freq: Every day | ORAL | Status: DC
  Administered 2023-08-01 – 2023-08-03 (×3): 6 mg via ORAL
  Filled 2023-08-01 (×3): qty 1

## 2023-08-01 MED ORDER — GUAIFENESIN-DM 100-10 MG/5ML PO SYRP
5.0000 mL | ORAL_SOLUTION | ORAL | Status: DC | PRN
  Administered 2023-08-01 (×2): 5 mL via ORAL
  Filled 2023-08-01 (×2): qty 10

## 2023-08-01 NOTE — Progress Notes (Signed)
[name] NOTE    JARRELL SFERRAZZA  MWN:[id number] DOB: 14-Oct-1940 DOA: 07/31/2023 PCP: Lorenda Ishihara, [name]   Brief Narrative: [facility] is a 82 y.o. male with a history of OSA on [name], [name] [name], [name] [name], [name] [name], [name] [name], [name] [name] to skin redness associated with right chest port concerning for possible cellulitis versus skin reaction to chemotherapy.  Antibiotics and steroids empirically started for treatment.   Assessment and Plan:  Erythematous rash Rash is located near/around right chest port. Initial concern for possible cellulitis, although clinically does not completely correlate. Also concern for possible infusion reaction. Patient was started empirically on antibiotics in addition to given steroids. Rash appears unchanged from admission. -Continue Cefepime/vancomycin pending blood culture results -Continue decadron  Paroxysmal [name] [name] -Continue Cardizem and Eliquis  [name] [name] Poor surgical candidate. Currently on chemotherapy, recently restarted on 11/21. Patient follows with Dr. Mosetta Putt.  [name] [name] -Continue Sinemet  [name] [name] Bone [name] Recurrent. Currently on chemotherapy.  OSA -Continue [name]    DVT prophylaxis: Eliquis Code Status:   Code Status: Full Code Family Communication: None at bedside Disposition Plan: Discharge home likely in 2-3 days pending improvement of rash   Consultants:  None  Procedures:  None  Antimicrobials: Vancomycin Cefepime    Subjective: Patient reports the rash does not bother him except when touched. No pain.  Objective: BP 114/80 (BP Location: Left Arm)   Pulse (!) 107   Temp 97.6 F (36.4 C) (Oral)   Resp 20   Ht 5\' 5"  (1.651 m)   Wt 73.2 kg   SpO2 97%   BMI 26.85 kg/m   Examination:  General exam: Appears calm and comfortable Respiratory system: Clear to  auscultation. Respiratory effort normal. Cardiovascular system: S1 & S2 heard. No murmurs, rubs, gallops or clicks. Gastrointestinal system: Abdomen is nondistended, soft and nontender. No organomegaly or masses felt. Normal bowel sounds heard. Central nervous system: Alert and oriented. No focal neurological deficits. Musculoskeletal: No edema. No calf tenderness Skin: Large erythematous rash involving entire upper right chest surrounding right chest port without tenderness or significant erythema Psychiatry: Judgement and insight appear normal. Mood & affect appropriate.    Data Reviewed: I have personally reviewed following labs and imaging studies  CBC Lab Results  Component Value Date   WBC 8.8 08/01/2023   RBC 3.83 (L) 08/01/2023   HGB 11.5 (L) 08/01/2023   HCT 36.3 (L) 08/01/2023   MCV 94.8 08/01/2023   MCH 30.0 08/01/2023   PLT 125 (L) 08/01/2023   MCHC 31.7 08/01/2023   RDW 15.0 08/01/2023   LYMPHSABS 0.6 (L) 07/31/2023   MONOABS 0.3 07/31/2023   EOSABS 0.1 07/31/2023   BASOSABS 0.0 07/31/2023     Last metabolic panel Lab Results  Component Value Date   NA 132 (L) 08/01/2023   K 4.1 08/01/2023   CL 104 08/01/2023   CO2 21 (L) 08/01/2023   BUN 18 08/01/2023   CREATININE 0.85 08/01/2023   GLUCOSE 150 (H) 08/01/2023   GFRNONAA >60 08/01/2023   GFRAA 47 (L) 05/06/2020   CALCIUM 8.8 (L) 08/01/2023   PHOS 2.6 02/09/2023   PROT 6.6 07/31/2023   ALBUMIN 4.0 07/31/2023   LABGLOB 2.2 12/04/2021   AGRATIO 2.1 12/04/2021   BILITOT 1.9 (H) 07/31/2023   ALKPHOS 56 07/31/2023   AST 31 07/31/2023   ALT <5 07/31/2023   ANIONGAP 7 08/01/2023    GFR: Estimated Creatinine Clearance: 59.3 mL/min (by  C-G formula based on SCr of 0.85 mg/dL).  Recent Results (from the past 240 hour(s))  Blood Culture (routine x 2)     Status: None (Preliminary result)   Collection Time: 07/31/23  1:00 PM   Specimen: BLOOD  Result Value Ref Range Status   Specimen Description   Final     BLOOD RIGHT ANTECUBITAL Performed at [facility], [address], [place], [place] [address]    Special Requests   Final    BOTTLES DRAWN AEROBIC AND ANAEROBIC Blood Culture adequate volume Performed at [facility], [address], [place], [place] [address]    Culture   Final    NO GROWTH < 24 HOURS Performed at [facility], [address], [place], [place] [address]    Report Status PENDING  Incomplete  Blood Culture (routine x 2)     Status: None (Preliminary result)   Collection Time: 07/31/23  1:15 PM   Specimen: BLOOD  Result Value Ref Range Status   Specimen Description   Final    BLOOD LEFT ANTECUBITAL Performed at [facility], [address] [place], [place] [address]    Special Requests   Final    BOTTLES DRAWN AEROBIC AND ANAEROBIC Blood Culture adequate volume Performed at [facility], [address] [place], [place] [address]    Culture   Final    NO GROWTH < 24 HOURS Performed at [facility], [address], [place], [place] [address]    Report Status PENDING  Incomplete  MRSA Next Gen by PCR, Nasal     Status: None   Collection Time: 08/01/23  2:00 AM   Specimen: Nasal Mucosa; Nasal Swab  Result Value Ref Range Status   MRSA by PCR Next Gen NOT DETECTED NOT DETECTED Final    Comment: (NOTE) The GeneXpert MRSA Assay (FDA approved for NASAL specimens only), is one component of a comprehensive MRSA colonization surveillance program. It is not intended to diagnose MRSA infection nor to guide or monitor treatment for MRSA infections. Test performance is not FDA approved in patients less than 86 years old. Performed at [facility], [address] [place], [place] [address]       Radiology Studies: [facility] Chest Port 1 View  Result Date: 07/31/2023 CLINICAL DATA:  Questionable sepsis - evaluate for abnormality. EXAM: PORTABLE CHEST 1 VIEW  COMPARISON:  07/05/2023. FINDINGS: Bilateral [name] fields are clear. No acute consolidation or [name] collapse. No pulmonary edema. Bilateral costophrenic angles are clear. Stable cardio-mediastinal silhouette. No acute osseous abnormalities. The soft tissues are within normal limits. Right-sided CT Port-A-Cath is seen with its tip overlying the upper portion of superior vena cava. IMPRESSION: *No active [name]. Electronically Signed   By: Jules Schick M.D.   On: 07/31/2023 13:39      LOS: 0 days    Jacquelin Hawking, [name] [facility] 08/01/2023, 10:33 AM   If 7PM-7AM, please contact night-coverage [email or web address]

## 2023-08-01 NOTE — Progress Notes (Signed)
   08/01/23 2211  BiPAP/CPAP/SIPAP  BiPAP/CPAP/SIPAP Pt Type Adult  BiPAP/CPAP/SIPAP DREAMSTATIOND  Mask Type Full face mask  Mask Size Medium  Respiratory Rate 18 breaths/min  FiO2 (%) 21 %  Patient Home Equipment No  Auto Titrate Yes (5-20 cmH2O)  BiPAP/CPAP /SiPAP Vitals  Resp (!) 25  MEWS Score/Color  MEWS Score 2  MEWS Score Color Yellow

## 2023-08-01 NOTE — Plan of Care (Signed)
Problem: Clinical Measurements: Goal: Diagnostic test results will improve Outcome: Progressing   Problem: Activity: Goal: Risk for activity intolerance will decrease Outcome: Progressing   Problem: Safety: Goal: Ability to remain free from injury will improve Outcome: Progressing

## 2023-08-01 NOTE — Hospital Course (Signed)
[name] [name] is a 82 y.o. male with a history of OSA on BiPAP, prostate cancer with bone metastasis, pancreatic cancer, Parkinson disease, atrial fibrillation, lung nodules.  Patient presented secondary to skin redness associated with right chest port concerning for possible cellulitis versus skin reaction to chemotherapy.  Antibiotics and steroids empirically started for treatment.

## 2023-08-02 DIAGNOSIS — C61 Malignant neoplasm of prostate: Secondary | ICD-10-CM | POA: Diagnosis not present

## 2023-08-02 DIAGNOSIS — L539 Erythematous condition, unspecified: Secondary | ICD-10-CM | POA: Diagnosis not present

## 2023-08-02 DIAGNOSIS — C7951 Secondary malignant neoplasm of bone: Secondary | ICD-10-CM

## 2023-08-02 DIAGNOSIS — T80219A Unspecified infection due to central venous catheter, initial encounter: Secondary | ICD-10-CM | POA: Diagnosis not present

## 2023-08-02 LAB — BASIC METABOLIC PANEL
Anion gap: 6 (ref 5–15)
BUN: 26 mg/dL — ABNORMAL HIGH (ref 8–23)
CO2: 22 mmol/L (ref 22–32)
Calcium: 9.2 mg/dL (ref 8.9–10.3)
Chloride: 109 mmol/L (ref 98–111)
Creatinine, Ser: 0.96 mg/dL (ref 0.61–1.24)
GFR, Estimated: >60 mL/min (ref >60)
Glucose, Bld: 166 mg/dL — ABNORMAL HIGH (ref 70–99)
Potassium: 4.4 mmol/L (ref 3.5–5.1)
Sodium: 137 mmol/L (ref 135–145)

## 2023-08-02 MED ORDER — METOPROLOL TARTRATE 5 MG/5ML IV SOLN
2.5000 mg | Freq: Once | INTRAVENOUS | Status: AC
  Administered 2023-08-02: 2.5 mg via INTRAVENOUS
  Filled 2023-08-02: qty 5

## 2023-08-02 MED ORDER — METOPROLOL TARTRATE 5 MG/5ML IV SOLN: 2.5000 mg | Freq: Once | INTRAVENOUS | Status: DC

## 2023-08-02 NOTE — TOC Initial Note (Addendum)
Transition of Care [facility]) - Initial/Assessment Note    Patient Details  Name: [name] [name] MRN: [id number] Date of Birth: [birth year]  Transition of Care [facility]) CM/SW Contact:    Howell Rucks, RN Phone Number: 08/02/2023, 12:50 PM  Clinical Narrative:  Met with pt and his son at bedside to introduce role of TOC/NCM and review for dc planning. Pt confirmed he resides in [facility] and plans to return at discharge. Pt reports no current home care services,  home DME: [employer]. Pt confirms he has an established PCP and pharmacy. Pt's son reports he will provide transportation at discharge.                 -12:53pm Call to [facility] admissions, sw Darian, confirmed pt is a current residence in ILF and is able to return, confirmed pt is receiving HHA services, Reports no FL2 is required. TOC will continue to follow.   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[facility]) Lives with:: Self Patient language and need for interpreter reviewed:: Yes Do you feel safe going back to the place where you live?: Yes      Need for Family Participation in Patient Care: Yes (Comment) Care giver support system in place?: Yes (comment) Current home services: DME ([employer]) Criminal Activity/Legal Involvement Pertinent to Current Situation/Hospitalization: No - Comment as needed  Activities of Daily Living   ADL Screening (condition at time of admission) Independently  performs ADLs?: Yes (appropriate for developmental age) Does the patient have a NEW difficulty with bathing/dressing/toileting/self-feeding that is expected to last >3 days?: No Does the patient have a NEW difficulty with getting in/out of bed, walking, or climbing stairs that is expected to last >3 days?: No Does the patient have a NEW difficulty with communication that is expected to last >3 days?: No Is the patient deaf or have difficulty hearing?: No Does the patient have difficulty seeing, even when wearing glasses/contacts?: No Does the patient have difficulty concentrating, remembering, or making decisions?: No  Permission Sought/Granted                  Emotional Assessment Appearance:: Appears stated age Attitude/Demeanor/Rapport: Gracious Affect (typically observed): Accepting Orientation: : Oriented to Self, Oriented to Place, Oriented to  Time, Oriented to Situation Alcohol / Substance Use: Not Applicable Psych Involvement: No (comment)  Admission diagnosis:  Infection due to Port-A-Cath [T80.219A] Hypotension, unspecified hypotension type [I95.9] Sepsis, due to unspecified organism, unspecified whether acute organ dysfunction present [facility]) [A41.9] Patient Active Problem List   Diagnosis Date Noted   Infection due to Port-A-Cath 07/31/2023   Port-A-Cath in place 07/29/2023   Lung nodules 06/24/2023   Long term (current) use of anticoagulants 05/08/2023   Atrial fibrillation with rapid ventricular response (HCC) 05/08/2023   Other constipation 04/22/2023   A-fib (HCC)  02/03/2023   Displaced fracture of right femoral neck (HCC) 02/03/2023   Femur fracture, right (HCC) 02/03/2023   Genetic testing 02/10/2022   Local infection due to Port-A-Cath 02/10/2022   Pancreatic cancer (HCC) 01/01/2022   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) 07/08/2020   Atherosclerosis of aorta (HCC) 04/10/2020   Heart palpitations 12/29/2019   Atrial  flutter with rapid ventricular response (HCC) 08/18/2019   New onset atrial flutter (HCC) 08/16/2019   Encounter for antineoplastic chemotherapy 07/17/2019   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/24/2019   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/30/2015   Parkinson's disease (HCC) 11/07/2015   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 [place] [address] Phone: [phone] Fax: [phone]  [facility] - [facility] [address] [place] [address] Phone: [phone] Fax: [phone]  [facility]  - [facility] [address] [place] [address] Phone: [phone] Fax: [phone]     Social Determinants of Health (SDOH) Social History: SDOH Screenings   Food Insecurity: No Food Insecurity (07/31/2023)  Housing: Low Risk  (07/31/2023)  Transportation Needs: No Transportation Needs (07/31/2023)  Utilities: Not At Risk (07/31/2023)  Depression (PHQ2-9): Low Risk  (12/23/2020)  Recent Concern: Depression (PHQ2-9) - Medium Risk (11/26/2020)  Tobacco Use: Medium Risk (08/01/2023)   SDOH Interventions:     Readmission Risk Interventions    08/02/2023   12:37 PM  Readmission Risk Prevention Plan  Transportation Screening Complete  PCP or Specialist Appt within 3-5 Days Complete  HRI or Home Care Consult Complete  Social Work Consult for Recovery Care Planning/Counseling Complete  Palliative Care Screening Not  Applicable  Medication Review Oceanographer) Complete

## 2023-08-02 NOTE — Progress Notes (Signed)
Patient set bed alarm off @ 0345. Confused. Tried to get OOB but doesn't know why.   Patient stated he knew he was at [facility].  BiPAP was off. Patient stated he didn't want it back on. No c/o pain or feeling "off".  VS checked. O2 98%, pulse 119.   Repositioned patient comfortably back to bed. Bed alarm reactivated. Personal items within reach.

## 2023-08-02 NOTE — Progress Notes (Signed)
Mobility Specialist - Progress Note  Pre-mobility: 94 bpm HR,  During mobility: 131 bpm HR,  Post-mobility: 116 bpm HR,    08/02/23 1319  Mobility  Activity Ambulated with assistance in hallway  Level of Assistance Minimal assist, patient does 75% or more  Assistive Device Four wheel walker (rollator)  Distance Ambulated (ft) 200 ft  Range of Motion/Exercises Active  Activity Response Tolerated well  Mobility Referral Yes  $Mobility charge 1 Mobility  Mobility Specialist Start Time (ACUTE ONLY) 1305  Mobility Specialist Stop Time (ACUTE ONLY) 1319  Mobility Specialist Time Calculation (min) (ACUTE ONLY) 14 min   Pt was found in bed and agreeable to ambulate. No complaints with session. At EOS returned to bed with all needs met. Call bell in reach and son in room.  [name] [name] Mobility Specialist

## 2023-08-02 NOTE — Progress Notes (Signed)
[name] [name]   DOB:Jan 22, 1941   ZO#:[id number]   JWJ#:[id number]  Medical oncology follow-up note  Subjective: Patient is well-known to me, under my care for his pancreatic cancer.  Due to the metastatic recurrence, he started chemotherapy last week.  He had a port placement the day before chemo and developed diffuse skin redness around the port when I saw him on November 21 for chemo.  He denies any fever, chills, he was concerned about the worsening skin redness around his port and came into the hospital 2 days ago.  His skin redness has improved over time, he is little concerned about his rapid heart rate from his atrial fibrillation.  He is able to ambulate in the hospital.   Objective:  Vitals:   08/02/23 0620 08/02/23 1200  BP: 118/83 114/82  Pulse: (!) 132 (!) 102  Resp: 20 16  Temp: 97.8 F (36.6 C) 97.6 F (36.4 C)  SpO2: 96% 99%    Body mass index is 26.85 kg/m.  Intake/Output Summary (Last 24 hours) at 08/02/2023 1203 Last data filed at 08/02/2023 0900 Gross per 24 hour  Intake 970 ml  Output 1050 ml  Net -80 ml     Sclerae unicteric  Oropharynx clear  No peripheral adenopathy  Lungs clear -- no rales or rhonchi  Heart regular rate and rhythm  Abdomen benign  MSK no focal spinal tenderness, no peripheral edema  Neuro nonfocal  Diffuse skin erythema in the upper for chest around his port, no skin ulcers or discharge.  No tenderness around his port.    CBG (last 3)  No results for input(s): "GLUCAP" in the last 72 hours.   Labs:   Urine Studies No results for input(s): "UHGB", "CRYS" in the last 72 hours.  Invalid input(s): "UACOL", "UAPR", "USPG", "UPH", "UTP", "UGL", "UKET", "UBIL", "UNIT", "UROB", "ULEU", "UEPI", "UWBC", "URBC", "UBAC", "CAST", "UCOM", "BILUA"  Basic Metabolic Panel: Recent Labs  Lab 07/29/23 0755 07/31/23 1300 08/01/23 0420 08/02/23 0340  NA 140 137 132* 137  K 4.0 4.9 4.1 4.4  CL 108 104 104 109  CO2 26 26 21* 22  GLUCOSE  153* 121* 150* 166*  BUN 21 25* 18 26*  CREATININE 1.02 1.01 0.85 0.96  CALCIUM 9.7 9.2 8.8* 9.2   GFR Estimated Creatinine Clearance: 52.5 mL/min (by C-G formula based on SCr of 0.96 mg/dL). Liver Function Tests: Recent Labs  Lab 07/29/23 0755 07/31/23 1300  AST 23 31  ALT <5 <5  ALKPHOS 58 56  BILITOT 1.0 1.9*  PROT 6.8 6.6  ALBUMIN 4.0 4.0   Recent Labs  Lab 07/31/23 1300  LIPASE 69*   No results for input(s): "AMMONIA" in the last 168 hours. Coagulation profile Recent Labs  Lab 07/31/23 1300  INR 1.3*    CBC: Recent Labs  Lab 07/29/23 0755 07/31/23 1300 08/01/23 0420  WBC 4.6 5.5 8.8  NEUTROABS 2.5 4.5  --   HGB 12.8* 11.0* 11.5*  HCT 40.3 33.9* 36.3*  MCV 93.3 92.6 94.8  PLT 192 137* 125*   Cardiac Enzymes: No results for input(s): "CKTOTAL", "CKMB", "CKMBINDEX", "TROPONINI" in the last 168 hours. BNP: Invalid input(s): "POCBNP" CBG: No results for input(s): "GLUCAP" in the last 168 hours. D-Dimer No results for input(s): "DDIMER" in the last 72 hours. Hgb A1c No results for input(s): "HGBA1C" in the last 72 hours. Lipid Profile No results for input(s): "CHOL", "HDL", "LDLCALC", "TRIG", "CHOLHDL", "LDLDIRECT" in the last 72 hours. Thyroid function studies No results for input(s): "  TSH", "T4TOTAL", "T3FREE", "THYROIDAB" in the last 72 hours.  Invalid input(s): "FREET3" Anemia work up No results for input(s): "VITAMINB12", "FOLATE", "FERRITIN", "TIBC", "IRON", "RETICCTPCT" in the last 72 hours. Microbiology Recent Results (from the past 240 hour(s))  Blood Culture (routine x 2)     Status: None (Preliminary result)   Collection Time: 07/31/23  1:00 PM   Specimen: BLOOD  Result Value Ref Range Status   Specimen Description   Final    BLOOD RIGHT ANTECUBITAL Performed at [facility], [address], [place], [place] [address]    Special Requests   Final    BOTTLES DRAWN AEROBIC AND ANAEROBIC Blood Culture adequate  volume Performed at [facility], [address], [place], [place] [address]    Culture   Final    NO GROWTH 2 DAYS Performed at [facility], [address], [place], [place] [address]    Report Status PENDING  Incomplete  Blood Culture (routine x 2)     Status: None (Preliminary result)   Collection Time: 07/31/23  1:15 PM   Specimen: BLOOD  Result Value Ref Range Status   Specimen Description   Final    BLOOD LEFT ANTECUBITAL Performed at [facility], [address], [place], [place] [address]    Special Requests   Final    BOTTLES DRAWN AEROBIC AND ANAEROBIC Blood Culture adequate volume Performed at [facility], [address] [place], [place] [address]    Culture   Final    NO GROWTH 2 DAYS Performed at [facility], [address], [place], [place] [address]    Report Status PENDING  Incomplete  MRSA Next Gen by PCR, Nasal     Status: None   Collection Time: 08/01/23  2:00 AM   Specimen: Nasal Mucosa; Nasal Swab  Result Value Ref Range Status   MRSA by PCR Next Gen NOT DETECTED NOT DETECTED Final    Comment: (NOTE) The GeneXpert MRSA Assay (FDA approved for NASAL specimens only), is one component of a comprehensive MRSA colonization surveillance program. It is not intended to diagnose MRSA infection nor to guide or monitor treatment for MRSA infections. Test performance is not FDA approved in patients less than 71 years old. Performed at [facility], [address], [place], [place] [address]       Studies:  DG Chest Port 1 View  Result Date: 07/31/2023 CLINICAL DATA:  Questionable sepsis - evaluate for abnormality. EXAM: PORTABLE CHEST 1 VIEW COMPARISON:  07/05/2023. FINDINGS: Bilateral lung fields are clear. No acute consolidation or lung collapse. No pulmonary edema. Bilateral costophrenic angles are clear. Stable cardio-mediastinal silhouette. No acute osseous  abnormalities. The soft tissues are within normal limits. Right-sided CT Port-A-Cath is seen with its tip overlying the upper portion of superior vena cava. IMPRESSION: *No active disease. Electronically Signed   By: Jules Schick M.D.   On: 07/31/2023 13:39    Assessment: 82 y.o. male   Diffuse skin erythema around his port, possible reaction to the port placement, versus cellulitis Paroxysmal atrial fibrillation Metastatic pancreatic cancer, recently started chemotherapy on November 21. Prostate cancer with bone metastasis OSA Generalized weakness and deconditioning  Plan:  -His skin erythema started after his port placement, before his chemotherapy on November 21.  This is likely allergy reaction to some topical agents during his port placement, although cellulitis is not completely ruled out.  He is on empiric antibiotics and his skin erythema is getting  better.  I do not think this is related to chemotherapy.  -Lab reviewed, he is not neutropenic, he tolerated first cycle chemotherapy well. -Discharge per hospitalist, I communicated with Dr. Caleb Popp -Plan to see him back in office next week before cycle 2 chemotherapy.   Malachy Mood, MD 08/02/2023  12:03 PM

## 2023-08-02 NOTE — Progress Notes (Signed)
PROGRESS NOTE    [name] [name]  ZOX:[id number] DOB: 1941-05-27 DOA: 07/31/2023 PCP: Lorenda Ishihara, MD   Brief Narrative: [name] [name] is a 82 y.o. male with a history of OSA on BiPAP, prostate cancer with bone metastasis, pancreatic cancer, Parkinson disease, atrial fibrillation, lung nodules.  Patient presented secondary to skin redness associated with right chest port concerning for possible cellulitis versus skin reaction to chemotherapy.  Antibiotics and steroids empirically started for treatment.   Assessment and Plan:  Erythematous rash Rash is located near/around right chest port. Initial concern for possible cellulitis, although clinically does not completely correlate. Also concern for possible infusion reaction although per oncology, rash was present prior to receiving chemotherapy.. Patient was started empirically on antibiotics in addition to given steroids. Rash appears unchanged from admission. -Continue Cefepime/vancomycin pending blood culture results; will consider transitioning to Doxycycline and Cefadroxil prior to discharge. -Continue decadron  Paroxysmal atrial fibrillation with RVR Rates have been somewhat uncontrolled, especially with mobility. Confirmed outpatient regimen. May need to adjust if remains uncontrolled. -Continue Cardizem and Eliquis  Pancreatic cancer Poor surgical candidate. Currently on chemotherapy, recently restarted on 11/21. Patient follows with Dr. Mosetta Putt.  Parkinson disease -Continue Sinemet  Prostate cancer Bone metastasis Recurrent. Currently on chemotherapy.  OSA -Continue BiPAP    DVT prophylaxis: Eliquis Code Status:   Code Status: Full Code Family Communication: None at bedside Disposition Plan: Discharge home likely in 1-2 days pending improvement of rash and atrial fibrillation with RVR   Consultants:  Oncology  Procedures:  None  Antimicrobials: Vancomycin Cefepime    Subjective: Rash  persists. No pain or itchiness today.  Objective: BP 114/82 (BP Location: Left Arm)   Pulse (!) 102   Temp 97.6 F (36.4 C) (Oral)   Resp 16   Ht 5\' 5"  (1.651 m)   Wt 73.2 kg   SpO2 99%   BMI 26.85 kg/m   Examination:  General exam: Appears calm and comfortable Respiratory system: Clear to auscultation. Respiratory effort normal. Cardiovascular system: S1 & S2 heard, irregular rhythm, normal rate Gastrointestinal system: Abdomen is nondistended, soft and nontender. Normal bowel sounds heard. Central nervous system: Alert and oriented. No focal neurological deficits. Musculoskeletal: No edema. No calf tenderness Skin: Large erythematous rash involving entire upper right chest surrounding right chest port without tenderness or significant erythema Psychiatry: Judgement and insight appear normal. Mood & affect appropriate.    Data Reviewed: I have personally reviewed following labs and imaging studies  CBC Lab Results  Component Value Date   WBC 8.8 08/01/2023   RBC 3.83 (L) 08/01/2023   HGB 11.5 (L) 08/01/2023   HCT 36.3 (L) 08/01/2023   MCV 94.8 08/01/2023   MCH 30.0 08/01/2023   PLT 125 (L) 08/01/2023   MCHC 31.7 08/01/2023   RDW 15.0 08/01/2023   LYMPHSABS 0.6 (L) 07/31/2023   MONOABS 0.3 07/31/2023   EOSABS 0.1 07/31/2023   BASOSABS 0.0 07/31/2023     Last metabolic panel Lab Results  Component Value Date   NA 137 08/02/2023   K 4.4 08/02/2023   CL 109 08/02/2023   CO2 22 08/02/2023   BUN 26 (H) 08/02/2023   CREATININE 0.96 08/02/2023   GLUCOSE 166 (H) 08/02/2023   GFRNONAA >60 08/02/2023   GFRAA 47 (L) 05/06/2020   CALCIUM 9.2 08/02/2023   PHOS 2.6 02/09/2023   PROT 6.6 07/31/2023   ALBUMIN 4.0 07/31/2023   LABGLOB 2.2 12/04/2021   AGRATIO 2.1 12/04/2021   BILITOT 1.9 (H) 07/31/2023  ALKPHOS 56 07/31/2023   AST 31 07/31/2023   ALT <5 07/31/2023   ANIONGAP 6 08/02/2023    GFR: Estimated Creatinine Clearance: 52.5 mL/min (by C-G formula based  on SCr of 0.96 mg/dL).  Recent Results (from the past 240 hour(s))  Blood Culture (routine x 2)     Status: None (Preliminary result)   Collection Time: 07/31/23  1:00 PM   Specimen: BLOOD  Result Value Ref Range Status   Specimen Description   Final    BLOOD RIGHT ANTECUBITAL Performed at [facility], [address], [place], [place] [address]    Special Requests   Final    BOTTLES DRAWN AEROBIC AND ANAEROBIC Blood Culture adequate volume Performed at [facility], [address], [place], [place] [address]    Culture   Final    NO GROWTH 2 DAYS Performed at [facility], [address] [place], [place] [address]    Report Status PENDING  Incomplete  Blood Culture (routine x 2)     Status: None (Preliminary result)   Collection Time: 07/31/23  1:15 PM   Specimen: BLOOD  Result Value Ref Range Status   Specimen Description   Final    BLOOD LEFT ANTECUBITAL Performed at [facility], [address], [place], [place] [address]    Special Requests   Final    BOTTLES DRAWN AEROBIC AND ANAEROBIC Blood Culture adequate volume Performed at [facility], [address], [place], [place] [address]    Culture   Final    NO GROWTH 2 DAYS Performed at [facility], [address], [place], [place] [address]    Report Status PENDING  Incomplete  MRSA Next Gen by PCR, Nasal     Status: None   Collection Time: 08/01/23  2:00 AM   Specimen: Nasal Mucosa; Nasal Swab  Result Value Ref Range Status   MRSA by PCR Next Gen NOT DETECTED NOT DETECTED Final    Comment: (NOTE) The GeneXpert MRSA Assay (FDA approved for NASAL specimens only), is one component of a comprehensive MRSA colonization surveillance program. It is not intended to diagnose MRSA infection nor to guide or monitor treatment for MRSA infections. Test performance is not FDA approved in patients less than 87 years old. Performed at  [facility], [address], [place], [place] [address]       Radiology Studies: No results found.    LOS: 1 day    Jacquelin Hawking, MD [facility] 08/02/2023, 4:16 PM   If 7PM-7AM, please contact night-coverage [email or web address]

## 2023-08-02 NOTE — Progress Notes (Signed)
   08/02/23 2131  BiPAP/CPAP/SIPAP  BiPAP/CPAP/SIPAP Pt Type Adult (pt prefers / comfortable with self-placement)  BiPAP/CPAP/SIPAP DREAMSTATIOND  Mask Type Full face mask  Mask Size Medium  FiO2 (%) 21 %  Patient Home Equipment No  Auto Titrate Yes (autobipap, min5cm, max20cm PS2-4)  CPAP/SIPAP surface wiped down Yes

## 2023-08-03 ENCOUNTER — Other Ambulatory Visit: Payer: Self-pay

## 2023-08-03 DIAGNOSIS — T80219A Unspecified infection due to central venous catheter, initial encounter: Secondary | ICD-10-CM | POA: Diagnosis not present

## 2023-08-03 MED ORDER — DOXYCYCLINE HYCLATE 100 MG PO TABS: 100.0000 mg | ORAL_TABLET | Freq: Two times a day (BID) | ORAL | 0 refills | Status: AC

## 2023-08-03 MED ORDER — CEFADROXIL 500 MG PO CAPS: 500.0000 mg | ORAL_CAPSULE | Freq: Two times a day (BID) | ORAL | 0 refills | Status: AC

## 2023-08-03 MED ORDER — DEXAMETHASONE 6 MG PO TABS: 6.0000 mg | ORAL_TABLET | Freq: Every day | ORAL | 0 refills | Status: AC

## 2023-08-03 NOTE — Discharge Instructions (Signed)
[name] [name],  You were in the hospital because of a rash. This was thought to be a possible infection but it appears to be more likely an allergic reaction. You will go home with steroids and antibiotics. Please follow-up with your PCP and oncologist.

## 2023-08-03 NOTE — Progress Notes (Signed)
PIV removed as noted - central tele notified of pt d/c - pt removed from tele box- AVS reviewed w/ pt patient - waiting on pt's daughter to return for d/c to ILF

## 2023-08-03 NOTE — Discharge Summary (Signed)
Physician Discharge Summary   Patient: [name] [name] MRN: [id number] DOB: 02-18-1941  Admit date:     07/31/2023  Discharge date: 08/03/23  Discharge Physician: Jacquelin Hawking, MD   PCP: Lorenda Ishihara, MD   Recommendations at discharge:  PCP visit for hospital follow-up Oncology visit for hospital follow-up.  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 *  Hospital Course: [name] [name] is a 82 y.o. male with a history of OSA on BiPAP, prostate cancer with bone metastasis, pancreatic cancer, Parkinson disease, atrial fibrillation, lung nodules.  Patient presented secondary to skin redness associated with right chest port concerning for possible cellulitis versus skin reaction to chemotherapy.  Antibiotics and steroids empirically started for treatment. Mild improvement in rash prior to discharge.  Assessment and Plan:  Erythematous rash Rash is located near/around right chest port. Initial concern for possible cellulitis, although clinically does not completely correlate. Also concern for possible infusion reaction although per oncology, rash was present prior to receiving chemotherapy.. Patient was started empirically on antibiotics in addition to given steroids. Rash appears slightly improved. Blood cultures with no growth. Will continue antibiotic regimen in addition to steroids and have patient follow-up with his oncologist. If no improvement may need dermatology follow-up and/or biopsy.   Paroxysmal atrial fibrillation with RVR Rates have been somewhat uncontrolled, especially with mobility. Confirmed outpatient regimen. Continue Cardizem and Eliquis.   Pancreatic cancer Poor surgical candidate. Currently on  chemotherapy, recently restarted on 11/21. Patient follows with Dr. Mosetta Putt.   Parkinson disease Continue Sinemet   Prostate cancer Bone metastasis Recurrent. Currently on chemotherapy.  Chronic pain Continue outpatient oxycodone.   OSA Continue BiPAP   Consultants: None Procedures performed: None  Disposition: Home w/ home health PT/OT Diet recommendation: Regular diet   DISCHARGE MEDICATION: Allergies as of 08/03/2023       Reactions   Influenza Vaccines Anaphylaxis   Flu shot: 1974 anaphylaxis. 2nd time swollen arm   Influenza Virus Vaccine Other (See Comments)        Medication List     TAKE these medications    acetaminophen 500 MG tablet Commonly known as: TYLENOL Take 1 tablet (500 mg total) by mouth every 6 (six) hours as needed for moderate pain or mild pain.   ammonium lactate 12 % lotion Commonly known as: LAC-HYDRIN Apply 1 Application topically as needed.   bisacodyl 10 MG suppository Commonly known as: DULCOLAX Place 1 suppository (10 mg total) rectally daily as needed for moderate constipation.   carbidopa-levodopa 25-100 MG tablet Commonly known as: SINEMET IR Take 3 tablets at 7 AM, 2 tablets at 11 AM, 3 tablets at 3 PM, 2 tablets at 7 PM   carbidopa-levodopa 50-200 MG tablet Commonly known as: SINEMET CR Take 1 tablet by mouth at bedtime.   cefadroxil 500 MG capsule Commonly known as: DURICEF Take 1 capsule (500 mg total) by mouth 2 (two) times daily for 3 days.   ciclopirox 8 % solution Commonly known as: PENLAC Apply 1 Application topically at bedtime.   clonazePAM 0.5 MG tablet Commonly known as: KLONOPIN Take 1 tablet (0.5 mg total) by mouth at bedtime.   cyanocobalamin 1000 MCG tablet Commonly known as: VITAMIN B12 Take 1,000 mcg by mouth daily.  dexamethasone 6 MG tablet Commonly known as: DECADRON Take 1 tablet (6 mg total) by mouth daily for 3 days. Start taking on: August 04, 2023   diltiazem 30 MG  tablet Commonly known as: CARDIZEM Take 1 tablet (30 mg total) by mouth 2 (two) times daily. Do not take if systolic blood pressure (top number) is less than 100, or if pulse if less than 55.   doxycycline 100 MG tablet Commonly known as: VIBRA-TABS Take 1 tablet (100 mg total) by mouth 2 (two) times daily for 3 days.   Eliquis 5 MG Tabs tablet Generic drug: apixaban TAKE 1 TABLET TWICE A DAY   ketoconazole 2 % shampoo Commonly known as: NIZORAL Apply 1 application  topically 3 (three) times a week.   leuprolide (6 Month) 45 MG injection Commonly known as: ELIGARD Inject 45 mg into the skin every 6 (six) months.   MiraLax 17 GM/SCOOP powder Generic drug: polyethylene glycol powder Take 17 g by mouth in the morning, at noon, and at bedtime.   omeprazole 20 MG capsule Commonly known as: PRILOSEC Take 1 capsule (20 mg total) by mouth daily.   ondansetron 4 MG tablet Commonly known as: ZOFRAN Take 1 tablet (4 mg total) by mouth every 6 (six) hours as needed for nausea.   oxyCODONE 5 MG immediate release tablet Commonly known as: Oxy IR/ROXICODONE Take 1 tablet (5 mg total) by mouth every 6 (six) hours as needed for severe pain (pain score 7-10).   Pancreaze 37000-97300 units Cpep Generic drug: Pancrelipase (Lip-Prot-Amyl) TAKE 2 CAPSULES WITH BREAKFAST, LUNCH, AND EVENING MEAL (WITH EACH MEAL) AND 1 CAPSULE WITH SNACK   prochlorperazine 10 MG tablet Commonly known as: COMPAZINE Take 1 tablet (10 mg total) by mouth every 6 (six) hours as needed for nausea or vomiting.   rosuvastatin 10 MG tablet Commonly known as: CRESTOR TAKE 1 TABLET DAILY   tiZANidine 2 MG tablet Commonly known as: ZANAFLEX Take 1 tablet (2 mg total) by mouth every 8 (eight) hours as needed for muscle spasms.   triamcinolone cream 0.1 % Commonly known as: KENALOG Apply 1 application topically 2 (two) times daily. What changed:  when to take this reasons to take this   VISINE DRY EYE OP Place  1 drop into both eyes 2 (two) times daily as needed (eye irritation).   zoledronic acid 5 MG/100ML Soln injection Commonly known as: RECLAST Inject 5 mg into the vein See admin instructions. Once a year        Discharge Exam: BP 101/72 (BP Location: Left Arm)   Pulse 94   Temp (!) 97.5 F (36.4 C) (Oral)   Resp 15   Ht 5\' 5"  (1.651 m)   Wt 73.2 kg   SpO2 99%   BMI 26.85 kg/m   General exam: Appears calm and comfortable Respiratory system: Clear to auscultation. Respiratory effort normal. Cardiovascular system: S1 & S2 heard, irregular rhythm, normal rate Gastrointestinal system: Abdomen is nondistended, soft and nontender. Normal bowel sounds heard. Central nervous system: Alert and oriented. No focal neurological deficits. Musculoskeletal: No calf tenderness Skin: Large right chest erythematous rash without tenderness or warmth  Psychiatry: Judgement and insight appear normal. Mood & affect appropriate.   Condition at discharge: stable  The results of significant diagnostics from this hospitalization (including imaging, microbiology, ancillary and laboratory) are listed below for reference.   Imaging Studies: DG Chest Port 1 View  Result Date: 07/31/2023 CLINICAL DATA:  Questionable sepsis - evaluate for abnormality. EXAM: PORTABLE  CHEST 1 VIEW COMPARISON:  07/05/2023. FINDINGS: Bilateral lung fields are clear. No acute consolidation or lung collapse. No pulmonary edema. Bilateral costophrenic angles are clear. Stable cardio-mediastinal silhouette. No acute osseous abnormalities. The soft tissues are within normal limits. Right-sided CT Port-A-Cath is seen with its tip overlying the upper portion of superior vena cava. IMPRESSION: *No active disease. Electronically Signed   By: Jules Schick M.D.   On: 07/31/2023 13:39   IR IMAGING GUIDED PORT INSERTION  Result Date: 07/28/2023 INDICATION: 82 year old male with metastatic pancreatic cancer. He presents for port catheter  placement. EXAM: IMPLANTED PORT A CATH PLACEMENT WITH ULTRASOUND AND FLUOROSCOPIC GUIDANCE MEDICATIONS: None. ANESTHESIA/SEDATION: Versed 2 mg IV; Fentanyl 75 mcg IV; administered by the radiology nurse Moderate Sedation Time:  15 minutes The patient's vital signs and level of consciousness were continuously monitored during the procedure by the interventional radiology nurse under my direct supervision. FLUOROSCOPY: Radiation exposure index: 1 mGy reference air kerma COMPLICATIONS: None immediate. PROCEDURE: The right neck and chest was prepped with chlorhexidine, and draped in the usual sterile fashion using maximum barrier technique (cap and mask, sterile gown, sterile gloves, large sterile sheet, hand hygiene and cutaneous antiseptic). Local anesthesia was attained by infiltration with 1% lidocaine with epinephrine. Ultrasound demonstrated patency of the right internal jugular vein, and this was documented with an image. Under real-time ultrasound guidance, this vein was accessed with a 21 gauge micropuncture needle and image documentation was performed. A small dermatotomy was made at the access site with an 11 scalpel. A 0.018" wire was advanced into the SVC and the access needle exchanged for a 13F micropuncture vascular sheath. The 0.018" wire was then removed and a 0.035" wire advanced into the IVC. An appropriate location for the subcutaneous reservoir was selected below the clavicle and an incision was made through the skin and underlying soft tissues. The subcutaneous tissues were then dissected using a combination of blunt and sharp surgical technique and a pocket was formed. A single lumen power injectable portacatheter [facility]) was then tunneled through the subcutaneous tissues from the pocket to the dermatotomy and the port reservoir placed within the subcutaneous pocket. The venous access site was then serially dilated and a peel away vascular sheath placed over the wire. The wire  was removed and the port catheter advanced into position under fluoroscopic guidance. The catheter tip is positioned in the superior cavoatrial junction. This was documented with a spot image. The portacatheter was then tested and found to flush and aspirate well. The port was flushed with saline followed by 100 units/mL heparinized saline. The pocket was then closed in two layers using first subdermal inverted interrupted absorbable sutures followed by a running subcuticular suture. The epidermis was then sealed with Dermabond. The dermatotomy at the venous access site was also closed with Dermabond. IMPRESSION: Successful placement of a right IJ approach Smart Port with ultrasound and fluoroscopic guidance. The catheter is ready for use. Electronically Signed   By: Malachy Moan M.D.   On: 07/28/2023 16:02   DG Chest Port 1 View  Result Date: 07/05/2023 CLINICAL DATA:  Status post bronchoscopy EXAM: PORTABLE CHEST 1 VIEW COMPARISON:  Chest radiograph dated 05/08/2023 FINDINGS: Low lung volumes with bronchovascular crowding. Bilateral interstitial and bibasilar patchy opacities. No pleural effusion or pneumothorax. Enlarged cardiomediastinal silhouette is likely projectional. No acute osseous abnormality. IMPRESSION: 1. Low lung volumes with bronchovascular crowding. Bilateral interstitial and bibasilar patchy opacities, may reflect atelectasis, pulmonary edema, or small foci of hemorrhage.  2. No pneumothorax. Electronically Signed   By: Agustin Cree M.D.   On: 07/05/2023 11:41   DG C-ARM BRONCHOSCOPY  Result Date: 07/05/2023 C-ARM BRONCHOSCOPY: Fluoroscopy was utilized by the requesting physician.  No radiographic interpretation.    Microbiology: Results for orders placed or performed during the hospital encounter of 07/31/23  Blood Culture (routine x 2)     Status: None (Preliminary result)   Collection Time: 07/31/23  1:00 PM   Specimen: BLOOD  Result Value Ref Range Status   Specimen  Description   Final    BLOOD RIGHT ANTECUBITAL Performed at [facility], [address], [place], [place] [address]    Special Requests   Final    BOTTLES DRAWN AEROBIC AND ANAEROBIC Blood Culture adequate volume Performed at [facility], [address] [place], [place] [address]    Culture   Final    NO GROWTH 3 DAYS Performed at [facility], [address] [place], [place] [address]    Report Status PENDING  Incomplete  Blood Culture (routine x 2)     Status: None (Preliminary result)   Collection Time: 07/31/23  1:15 PM   Specimen: BLOOD  Result Value Ref Range Status   Specimen Description   Final    BLOOD LEFT ANTECUBITAL Performed at [facility], [address] [place], [place] [address]    Special Requests   Final    BOTTLES DRAWN AEROBIC AND ANAEROBIC Blood Culture adequate volume Performed at [facility], [address], [place], [place] [address]    Culture   Final    NO GROWTH 3 DAYS Performed at [facility], [address], [place], [place] [address]    Report Status PENDING  Incomplete  MRSA Next Gen by PCR, Nasal     Status: None   Collection Time: 08/01/23  2:00 AM   Specimen: Nasal Mucosa; Nasal Swab  Result Value Ref Range Status   MRSA by PCR Next Gen NOT DETECTED NOT DETECTED Final    Comment: (NOTE) The GeneXpert MRSA Assay (FDA approved for NASAL specimens only), is one component of a comprehensive MRSA colonization surveillance program. It is not intended to diagnose MRSA infection nor to guide or monitor treatment for MRSA infections. Test performance is not FDA approved in patients less than 96 years old. Performed at [facility], [address] [place], [place] [address]     Labs: CBC: Recent Labs  Lab 07/29/23 0755 07/31/23 1300 08/01/23 0420  WBC 4.6 5.5 8.8  NEUTROABS 2.5 4.5  --   HGB 12.8* 11.0* 11.5*  HCT 40.3  33.9* 36.3*  MCV 93.3 92.6 94.8  PLT 192 137* 125*   Basic Metabolic Panel: Recent Labs  Lab 07/29/23 0755 07/31/23 1300 08/01/23 0420 08/02/23 0340  NA 140 137 132* 137  K 4.0 4.9 4.1 4.4  CL 108 104 104 109  CO2 26 26 21* 22  GLUCOSE 153* 121* 150* 166*  BUN 21 25* 18 26*  CREATININE 1.02 1.01 0.85 0.96  CALCIUM 9.7 9.2 8.8* 9.2   Liver Function Tests: Recent Labs  Lab 07/29/23 0755 07/31/23 1300  AST 23 31  ALT <5 <5  ALKPHOS 58 56  BILITOT 1.0 1.9*  PROT 6.8 6.6  ALBUMIN 4.0 4.0     Discharge time spent: 35 minutes.  Signed: Jacquelin Hawking, MD [facility] 08/03/2023

## 2023-08-03 NOTE — Progress Notes (Signed)
AVS reviewed w/ pt & daughter  who verbalized an understanding- no other questions at this time - pt dressing for d/c to home. Pt to lobby via w/c

## 2023-08-03 NOTE — Evaluation (Signed)
Physical Therapy Evaluation Patient Details Name: [name] [name] MRN: [id number] DOB: 1940-12-13 Today's Date: 08/03/2023  History of Present Illness  Pt is a 82 y.o. male admitted with erythematous rash around R chest port. PMH significant for paroxysmal AFIB, pancreatic cancer, Parkinson disease, prostate cancer, bone mets, OSA on BiPap and R hip fx s/p hip hemiarthoplasty 02/05/23.  Clinical Impression  Pt ambulated 350' with rollator, no loss of balance, good awareness of and attempt to correct shuffling when it occurs. Pt appears to be near baseline with mobility, he reports he's fatiguing a little more quickly than usual but attributes this to being less active during hospitalization. He is ready to DC back to his ILF from a PT standpoint.          If plan is discharge home, recommend the following: A little help with bathing/dressing/bathroom;Help with stairs or ramp for entrance   Can travel by private vehicle        Equipment Recommendations None recommended by PT  Recommendations for Other Services       Functional Status Assessment Patient has not had a recent decline in their functional status     Precautions / Restrictions Precautions Precautions: Fall Restrictions Weight Bearing Restrictions: No      Mobility  Bed Mobility               General bed mobility comments: increased time (hx of Parkionsons), light minA provided by daughter for LEs    Transfers Overall transfer level: Needs assistance Equipment used: Rollator (4 wheels) Transfers: Sit to/from Stand Sit to Stand: Supervision           General transfer comment: good safety awareness with brakes on rollator    Ambulation/Gait Ambulation/Gait assistance: Supervision Gait Distance (Feet): 350 Feet Assistive device: Rollator (4 wheels) Gait Pattern/deviations: Shuffle, Step-through pattern, Decreased step length - right, Decreased step length - left Gait velocity: WFL     General  Gait Details: steady, no loss of balance, intermittent shuffling but pt with good awareness of when he starts to shuffle and is able to self correct without verbal cues ~50% of the time, some VCs to increase step length when he started shuffling  Stairs            Wheelchair Mobility     Tilt Bed    Modified Rankin (Stroke Patients Only)       Balance Overall balance assessment: History of Falls, Needs assistance Sitting-balance support: Feet supported, No upper extremity supported Sitting balance-Leahy Scale: Good     Standing balance support: Bilateral upper extremity supported, During functional activity, Reliant on assistive device for balance Standing balance-Leahy Scale: Good Standing balance comment: steady in standing with one hand on rollator                             Pertinent Vitals/Pain Pain Assessment Pain Assessment: No/denies pain    Home Living Family/patient expects to be discharged to:: Private residence (Independent Living Facility) Living Arrangements: Alone Available Help at Discharge: Family;Available 24 hours/day Type of Home: Independent living facility Home Access: Level entry;Elevator       Home Layout: One level Home Equipment: Grab bars - tub/shower;Rollator (4 wheels);Adaptive equipment;Shower seat Additional Comments: resides at [facility] ILF    Prior Function Prior Level of Function : Independent/Modified Independent;Driving             Mobility Comments: Walked with rollator in facility, goes to gym  daily,  1 fall this year resulting in R hip fx (May 2024) ADLs Comments: mod indpendent with ADLs, daughter/son assist with IADLs and transportation, pt can make his own breakfast     Extremity/Trunk Assessment   Upper Extremity Assessment Upper Extremity Assessment: Defer to OT evaluation    Lower Extremity Assessment Lower Extremity Assessment: Overall WFL for tasks assessed    Cervical / Trunk  Assessment Cervical / Trunk Assessment: Kyphotic  Communication   Communication Communication: No apparent difficulties  Cognition Arousal: Alert Behavior During Therapy: WFL for tasks assessed/performed Overall Cognitive Status: Within Functional Limits for tasks assessed                                          General Comments      Exercises     Assessment/Plan    PT Assessment Patient does not need any further PT services  PT Problem List         PT Treatment Interventions      PT Goals (Current goals can be found in the Care Plan section)  Acute Rehab PT Goals Patient Stated Goal: plays bingo, works out daily at [employer] PT Goal Formulation: With patient/family    Frequency       Co-evaluation               AM-PAC PT "6 Clicks" Mobility  Outcome Measure Help needed turning from your back to your side while in a flat bed without using bedrails?: None Help needed moving from lying on your back to sitting on the side of a flat bed without using bedrails?: A Little Help needed moving to and from a bed to a chair (including a wheelchair)?: None Help needed standing up from a chair using your arms (e.g., wheelchair or bedside chair)?: None Help needed to walk in hospital room?: None Help needed climbing 3-5 steps with a railing? : A Little 6 Click Score: 22    End of Session Equipment Utilized During Treatment: Gait belt Activity Tolerance: Patient tolerated treatment well Patient left: with call bell/phone within reach;Other (comment) (on toilet) Nurse Communication: Mobility status      Time: 4098-1191 PT Time Calculation (min) (ACUTE ONLY): 19 min   Charges:   PT Evaluation $PT Eval Low Complexity: 1 Low   PT General Charges $$ ACUTE PT VISIT: 1 Visit        Tamala Ser PT 08/03/2023  Acute Rehabilitation Services  Office [phone]

## 2023-08-03 NOTE — Evaluation (Signed)
Occupational Therapy Evaluation Patient Details Name: [name] [name] MRN: [id number] DOB: 15-Nov-1940 Today's Date: 08/03/2023   History of Present Illness Pt is a 82 y.o. male admitted with erythematous rash around R chest port. PMH significant for paroxysmal AFIB, pancreatic cancer, Parkinson disease, prostate cancer, bone mets, OSA on BiPap and R hip fx.   Clinical Impression   Prior to hospital admission, pt was mod independent with ADLs using rollator for mobility. Pt lives at an independent living facility, he goes to dining hall for most meals, daughter/son assist with IADLs with pt preparing his own breakfast. Pt currently requires setup/supervision for UB ADLs, CGA for LB ADLs and functional mobility using rollator. Completed bed mobility with minA to transition, transfers/mobility using rollator ~133ft in hallway with CGA (2 standing rest breaks, HR 114-120). On RA, denies pain or SOB throughout.   Discussed recommendations for discharge with pt and daughter, anticipate pt requiring 24/7 supervision/assist which daughter states she and brother can provide.  Educated on safe performance of ADLs with necessary DME. Pt would benefit from skilled OT services to address noted impairments and functional limitations (see below for any additional details) in order to maximize safety and independence while minimizing falls risk and caregiver burden. Anticipate the need for follow up [facility] OT services upon acute hospital DC.        If plan is discharge home, recommend the following: A little help with walking and/or transfers;A little help with bathing/dressing/bathroom;Assistance with cooking/housework;Direct supervision/assist for medications management;Direct supervision/assist for financial management;Assist for transportation    Functional Status Assessment  Patient has had a recent decline in their functional status and demonstrates the ability to make significant improvements in function in  a reasonable and predictable amount of time.  Equipment Recommendations  None recommended by OT    Recommendations for Other Services       Precautions / Restrictions Precautions Precautions: Fall Restrictions Weight Bearing Restrictions: No      Mobility Bed Mobility Overal bed mobility: Needs Assistance Bed Mobility: Supine to Sit, Sit to Supine     Supine to sit: Min assist Sit to supine: Supervision   General bed mobility comments: increased time (hx of [place]), light minA provided by daughter / OT for trunk support, cues for sequencing    Transfers Overall transfer level: Needs assistance Equipment used: Rollator (4 wheels) Transfers: Sit to/from Stand Sit to Stand: Supervision                  Balance Overall balance assessment: History of Falls, Needs assistance Sitting-balance support: Feet supported, No upper extremity supported Sitting balance-Leahy Scale: Good     Standing balance support: Bilateral upper extremity supported, During functional activity, Reliant on assistive device for balance Standing balance-Leahy Scale: Good Standing balance comment: steady in standing with one hand on rollator                           ADL either performed or assessed with clinical judgement   ADL Overall ADL's : Needs assistance/impaired Eating/Feeding: Set up;Sitting   Grooming: Sitting;Supervision/safety   Upper Body Bathing: Set up;Sitting   Lower Body Bathing: Contact guard assist;Sit to/from stand;Sitting/lateral leans   Upper Body Dressing : Set up   Lower Body Dressing: Contact guard assist;Sit to/from stand;Sitting/lateral leans   Toilet Transfer: Contact guard assist;Ambulation;Rollator (4 wheels);Regular Toilet;Grab bars   Toileting- Clothing Manipulation and Hygiene: Contact guard assist;Sit to/from stand;Sitting/lateral lean  Functional mobility during ADLs: Rollator (4 wheels);Contact guard assist General ADL  Comments: Pt near baseline for ADL peformance. Setup assist for UB ADLs, CGA for LB ADLs. Functional transfers and mobility with rollator and CGA.     Vision Baseline Vision/History: 1 Wears glasses Ability to See in Adequate Light: 0 Adequate Patient Visual Report: Other (comment) (pt noted that he had eye appointment today, and thinks he may need new glasses as it is a "little" more difficult to see. Reads name badge and clock on wall WNL)              Pertinent Vitals/Pain Pain Assessment Pain Assessment: No/denies pain     Extremity/Trunk Assessment Upper Extremity Assessment Upper Extremity Assessment: Right hand dominant;Generalized weakness   Lower Extremity Assessment Lower Extremity Assessment: Generalized weakness       Communication Communication Communication: No apparent difficulties   Cognition Arousal: Alert Behavior During Therapy: WFL for tasks assessed/performed Overall Cognitive Status: Within Functional Limits for tasks assessed                                          Exercises Exercises: Other exercises Other Exercises Other Exercises: Educated pt and daughter on role and purpose of OT in acute care, recommendation for discharge including inital 24/7 support and assistance as pt regains his strength. Pt and daughter verbalize understanding and all questions answered at this time Other Exercises: Discussed energy conservation strategies with pt / daughter including standing rest breaks, seated ADLs and lowering water temp in showers to minimize fatigue, importance of mobility and performance of sit to stands to work on functional movements with CGA/SBA assist   Shoulder Instructions      Home Living Family/patient expects to be discharged to:: Private residence (Independent Living Facility) Living Arrangements: Alone Available Help at Discharge: Family;Available 24 hours/day Type of Home: Independent living facility Home Access:  Level entry;Elevator     Home Layout: One level     Bathroom Shower/Tub: Producer, television/film/video: Standard Bathroom Accessibility: Yes How Accessible: Accessible via walker (uses rollator) Home Equipment: Grab bars - tub/shower;Rollator (4 wheels);Adaptive equipment;Shower seat Adaptive Equipment: Reacher        Prior Functioning/Environment Prior Level of Function : Independent/Modified Independent;Driving             Mobility Comments: Walked with rollator in facility, 1 fall this year resulting in R hip fx (spring 2024) ADLs Comments: mod indpendent with ADLs, daughter/son assist with IADLs and transportation, pt can make his own breakfast        OT Problem List: Decreased strength;Decreased activity tolerance;Impaired balance (sitting and/or standing);Decreased coordination      OT Treatment/Interventions: Self-care/ADL training;Therapeutic exercise;Neuromuscular education;Energy conservation;DME and/or AE instruction;Therapeutic activities;Patient/family education;Balance training    OT Goals(Current goals can be found in the care plan section) Acute Rehab OT Goals Patient Stated Goal: to go home today OT Goal Formulation: With patient/family Time For Goal Achievement: 08/17/23 Potential to Achieve Goals: Good  OT Frequency: Min 1X/week    Co-evaluation              AM-PAC OT "6 Clicks" Daily Activity     Outcome Measure Help from another person eating meals?: None Help from another person taking care of personal grooming?: None Help from another person toileting, which includes using toliet, bedpan, or urinal?: A Little Help from another person bathing (including  washing, rinsing, drying)?: A Little Help from another person to put on and taking off regular upper body clothing?: A Little Help from another person to put on and taking off regular lower body clothing?: A Little 6 Click Score: 20   End of Session Equipment Utilized During  Treatment: Gait belt;Rollator (4 wheels) Nurse Communication: Mobility status  Activity Tolerance: Patient tolerated treatment well Patient left: in bed;with call bell/phone within reach;with bed alarm set;with family/visitor present  OT Visit Diagnosis: Unsteadiness on feet (R26.81);Other abnormalities of gait and mobility (R26.89);Muscle weakness (generalized) (M62.81)                Time: 1610-9604 OT Time Calculation (min): 30 min Charges:  OT General Charges $OT Visit: 1 Visit OT Evaluation $OT Eval Low Complexity: 1 Low OT Treatments $Self Care/Home Management : 8-22 mins  Ekansh Sherk L. Skyy Mcknight, OTR/L  08/03/23, 12:15 PM

## 2023-08-03 NOTE — Progress Notes (Signed)
Pharmacy Antibiotic Note  [name] [name] is a 82 y.o. male admitted on 07/31/2023 with cellulitis.  Pharmacy has been consulted for Vanco, Cefepime dosing.  ID: Empiric abx for skin redness associated with right chest port concerning for possible cellulitis versus skin reaction to chemotherapy.  - Afebrile, WBC 8.8, Scr <1  11/23: BC x 2>>NGTD 11/23: MRSA PCR: neg  11/23: Vanco>> 11/23: Cefepime>>  Plan: Continue same -Vanc 1250 mg q24h (eAUC 442, Scr 0.85). No levels for now -Cefepime 2 g q12h - MD will consider transitioning to Doxycycline and Cefadroxil prior to discharge.     Height: 5\' 5"  (165.1 cm) Weight: 73.2 kg (161 lb 6 oz) IBW/kg (Calculated) : 61.5  Temp (24hrs), Avg:98.2 F (36.8 C), Min:97.5 F (36.4 C), Max:99.5 F (37.5 C)  Recent Labs  Lab 07/29/23 0755 07/31/23 1300 07/31/23 1323 07/31/23 1549 08/01/23 0420 08/02/23 0340  WBC 4.6 5.5  --   --  8.8  --   CREATININE 1.02 1.01  --   --  0.85 0.96  LATICACIDVEN  --   --  0.7 1.0  --   --     Estimated Creatinine Clearance: 52.5 mL/min (by C-G formula based on SCr of 0.96 mg/dL).    Allergies  Allergen Reactions   Influenza Vaccines Anaphylaxis    Flu shot: 1974 anaphylaxis. 2nd time swollen arm   Influenza Virus Vaccine Other (See Comments)    Fiza Nation S. Merilynn Finland, PharmD, BCPS Clinical Staff Pharmacist [email or web address]  Pasty Spillers 08/03/2023 7:23 AM

## 2023-08-03 NOTE — TOC Progression Note (Addendum)
Transition of Care [facility]) - Progression Note    Patient Details  Name: [name] [name] MRN: [id number] Date of Birth: September 27, 1940  [facility] CM/SW Contact  Howell Rucks, RN Phone Number: 08/03/2023, 9:34 AM  Clinical Narrative:   Text received from [facility] rep-[employer], states received message from [facility] asing [facility] agency to see pt for RN/OT/PT after he discharges.   -11:30am PT eval, await recommendation.     Expected Discharge Plan: Home/Self Care Barriers to Discharge: Continued Medical Work up  Expected Discharge Plan and Services       Living arrangements for the past 2 months: Independent Living Facility ([facility])                                       Social Determinants of Health (SDOH) Interventions SDOH Screenings   Food Insecurity: No Food Insecurity (07/31/2023)  Housing: Low Risk  (07/31/2023)  Transportation Needs: No Transportation Needs (07/31/2023)  Utilities: Not At Risk (07/31/2023)  Depression (PHQ2-9): Low Risk  (12/23/2020)  Recent Concern: Depression (PHQ2-9) - Medium Risk (11/26/2020)  Tobacco Use: Medium Risk (08/01/2023)    Readmission Risk Interventions    08/02/2023   12:37 PM  Readmission Risk Prevention Plan  Transportation Screening Complete  PCP or Specialist Appt within 3-5 Days Complete  HRI or Home Care Consult Complete  Social Work Consult for Recovery Care Planning/Counseling Complete  Palliative Care Screening Not Applicable  Medication Review Oceanographer) Complete

## 2023-08-05 LAB — CULTURE, BLOOD (ROUTINE X 2)
Culture: NO GROWTH
Culture: NO GROWTH
Special Requests: ADEQUATE
Special Requests: ADEQUATE

## 2023-08-06 ENCOUNTER — Ambulatory Visit (HOSPITAL_COMMUNITY)
Admission: RE | Admit: 2023-08-06 | Discharge: 2023-08-06 | Disposition: A | Discharge status: Home / Self Care | Payer: Medicare Other | Source: Ambulatory Visit | Attending: Hematology | Admitting: Hematology

## 2023-08-06 DIAGNOSIS — K7689 Other specified diseases of liver: Secondary | ICD-10-CM | POA: Diagnosis not present

## 2023-08-06 DIAGNOSIS — C250 Malignant neoplasm of head of pancreas: Secondary | ICD-10-CM | POA: Diagnosis not present

## 2023-08-06 DIAGNOSIS — K838 Other specified diseases of biliary tract: Secondary | ICD-10-CM | POA: Diagnosis not present

## 2023-08-06 DIAGNOSIS — Z905 Acquired absence of kidney: Secondary | ICD-10-CM | POA: Diagnosis not present

## 2023-08-06 DIAGNOSIS — K8689 Other specified diseases of pancreas: Secondary | ICD-10-CM | POA: Diagnosis not present

## 2023-08-06 MED ORDER — IOHEXOL 300 MG/ML  SOLN
100.0000 mL | Freq: Once | INTRAMUSCULAR | Status: AC | PRN
  Administered 2023-08-06: 100 mL via INTRAVENOUS

## 2023-08-06 MED ORDER — HEPARIN SOD (PORK) LOCK FLUSH 100 UNIT/ML IV SOLN
500.0000 [IU] | Freq: Once | INTRAVENOUS | Status: AC
  Administered 2023-08-06: 500 [IU] via INTRAVENOUS

## 2023-08-10 ENCOUNTER — Other Ambulatory Visit: Payer: Self-pay

## 2023-08-10 ENCOUNTER — Encounter: Payer: Self-pay | Admitting: Neurology

## 2023-08-10 ENCOUNTER — Encounter: Payer: Self-pay | Admitting: Hematology

## 2023-08-10 DIAGNOSIS — L209 Atopic dermatitis, unspecified: Secondary | ICD-10-CM | POA: Diagnosis not present

## 2023-08-10 DIAGNOSIS — I482 Chronic atrial fibrillation, unspecified: Secondary | ICD-10-CM | POA: Diagnosis not present

## 2023-08-10 DIAGNOSIS — Z1331 Encounter for screening for depression: Secondary | ICD-10-CM | POA: Diagnosis not present

## 2023-08-10 DIAGNOSIS — Z9989 Dependence on other enabling machines and devices: Secondary | ICD-10-CM | POA: Diagnosis not present

## 2023-08-10 DIAGNOSIS — C259 Malignant neoplasm of pancreas, unspecified: Secondary | ICD-10-CM | POA: Diagnosis not present

## 2023-08-10 DIAGNOSIS — C61 Malignant neoplasm of prostate: Secondary | ICD-10-CM | POA: Diagnosis not present

## 2023-08-10 DIAGNOSIS — I4892 Unspecified atrial flutter: Secondary | ICD-10-CM | POA: Diagnosis not present

## 2023-08-10 DIAGNOSIS — G20B1 Parkinson's disease with dyskinesia, without mention of fluctuations: Secondary | ICD-10-CM | POA: Diagnosis not present

## 2023-08-10 DIAGNOSIS — E785 Hyperlipidemia, unspecified: Secondary | ICD-10-CM | POA: Diagnosis not present

## 2023-08-10 DIAGNOSIS — M810 Age-related osteoporosis without current pathological fracture: Secondary | ICD-10-CM | POA: Diagnosis not present

## 2023-08-10 DIAGNOSIS — G4733 Obstructive sleep apnea (adult) (pediatric): Secondary | ICD-10-CM | POA: Diagnosis not present

## 2023-08-10 DIAGNOSIS — Z0000 Encounter for general adult medical examination without abnormal findings: Secondary | ICD-10-CM | POA: Diagnosis not present

## 2023-08-10 DIAGNOSIS — D6859 Other primary thrombophilia: Secondary | ICD-10-CM | POA: Diagnosis not present

## 2023-08-10 DIAGNOSIS — G20A1 Parkinson's disease without dyskinesia, without mention of fluctuations: Secondary | ICD-10-CM

## 2023-08-10 MED ORDER — CARBIDOPA-LEVODOPA 25-100 MG PO TABS
ORAL_TABLET | ORAL | 0 refills | Status: DC
Start: 2023-08-10 — End: 2023-10-26

## 2023-08-10 MED ORDER — OMEPRAZOLE 20 MG PO CPDR: 20.0000 mg | DELAYED_RELEASE_CAPSULE | Freq: Every day | ORAL | 3 refills | Status: DC

## 2023-08-12 NOTE — Progress Notes (Unsigned)
Patient Care Team: Lorenda Ishihara, MD as PCP - General (Internal Medicine) Tessa Lerner, DO as PCP - Cardiology (Cardiology) Tat, Octaviano Batty, DO as Consulting Physician (Neurology) Earna Coder, MD as Consulting Physician (Hematology and Oncology) Fritzi Mandes, MD as Consulting Physician (General Surgery) Malachy Mood, MD as Consulting Physician (Hematology and Oncology) Josephine Igo, DO as Consulting Physician (Pulmonary Disease)  Clinic Day:  08/15/2023  Referring physician: Lorenda Ishihara,*  ASSESSMENT & PLAN:   Assessment & Plan: Pancreatic cancer [facility]) -diagnosed in 09/2021, deemed poor surgical candidate due to his age and medical co-morbidities -S/p single agent gemcitabine 01/13/22 - 04/01/22 and 5 fx SBRT [name]) 05/04/22 - 05/14/22. CA 19-9 decreased after treatment -CT scan from May 17, 2023 showed multiple lung nodules, with dominant 1.5 cm nodule in the right middle lobe, highly concerning for metastasis.  His lung lesions are positive on PET scan. -Bronchoscopy and biopsy attempted, which showed atypical cells. -He restarted chemo gemcitabine and abraxane on 07/29/2023 -Restaging CT CAP from 11/29/2024S showing no changes in pancreatic head mass.  Some mild pancreatic duct and bile duct dilation.  Bony lesion of left ischial tuberosity is approximately the same.  There is a decrease in density of the left iliac bony lesion.  Multiple, small lung nodules are unchanged. -Today's cycle 1 day 15 gemcitabine with Abraxane.   Plan: Labs reviewed  -CBC showing WBC 3.8; Hgb 10.6; Hct 33.5; Plt 238; Anc 1.9 -CMP - K 4.2; glucose 172; BUN 17; Creatinine 0.91; eGFR > 60; Ca 9.0; LFTs normal.   -Reviewed CT scan chest abdomen pelvis done 08/06/2023 with patient.  Scan showing no changes in pancreatic head mass.  Some mild pancreatic duct and bile duct dilation.  Bony lesion of left ischial tuberosity is approximately the same.  There is a decrease in density  of the left iliac bony lesion.  Multiple, small lung nodules are unchanged. Labs adequate to proceed with treatment today.  Treated with 500 cc bolus of normal saline.  Blood pressure did rebound some to 102 systolic.  Gave a second 500 cc bolus of normal saline along with chemotherapy treatment.  Blood pressure prior to discharging home was 138 systolic.  Patient reported feeling well. New prescription for Emla cream sent to the pharmacy.  Reviewed instructions for use, applying generous amount to port site 1 to 2 hours prior to appointment time. Labs/flush, follow-up, and treatment in 2 weeks. The patient understands the plans discussed today and is in agreement with them.  He knows to contact our office if he develops concerns prior to his next appointment.  I provided 30 minutes of face-to-face time during this encounter and > 50% was spent counseling as documented under my assessment and plan.    Carlean Jews, NP  [facility] - A DEPT OF [facility] [address] [place] [address] Dept: [phone] Dept Fax: [phone]   No orders of the defined types were placed in this encounter.     CHIEF COMPLAINT:  CC: Follow-up pancreatic cancer  Current Treatment: Gemcitabine and Abraxane every 2 weeks  INTERVAL HISTORY:  Adhiraj is here today for repeat clinical assessment.  He last saw Dr. Mosetta Putt on 07/29/2023.  He had a restaging scan on 08/06/2023.  This CT CAP showed no changes in the pancreatic head mass.  There is increased bile duct and pancreatic duct dilation.  Bone lesion of left ischial tuberosity is unchanged.  Osseous lesion in left iliac area has decreased in density.  Multiple, small lung nodules are unchanged.  He restarted gemcitabine and Abraxane on his last visit.  Dose reduced due to advanced age.  Today is cycle 1 day 15. He denies chest pain, chest pressure, or shortness of breath. He denies  headaches or visual disturbances. He denies abdominal pain, nausea, vomiting, or changes in bowel or bladder habits.    His appetite is good. His weight has been stable since he was last seen in cancer center on 07/29/2023.  Blood pressure is moderately low today.  Patient denies dizziness or shortness of breath.  He denies weakness.  He believes he drinks plenty of fluids.  I have reviewed the past medical history, past surgical history, social history and family history with the patient and they are unchanged from previous note.  ALLERGIES:  is allergic to influenza vaccines and influenza virus vaccine.  MEDICATIONS:  Current Outpatient Medications  Medication Sig Dispense Refill   acetaminophen (TYLENOL) 500 MG tablet Take 1 tablet (500 mg total) by mouth every 6 (six) hours as needed for moderate pain or mild pain. 30 tablet 0   ammonium lactate (LAC-HYDRIN) 12 % lotion Apply 1 Application topically as needed.     bisacodyl (DULCOLAX) 10 MG suppository Place 1 suppository (10 mg total) rectally daily as needed for moderate constipation. 12 suppository 0   carbidopa-levodopa (SINEMET CR) 50-200 MG tablet Take 1 tablet by mouth at bedtime. 90 tablet 0   carbidopa-levodopa (SINEMET IR) 25-100 MG tablet Take 3 tablets at 7 AM, 2 tablets at 11 AM, 3 tablets at 3 PM, 2 tablets at 7 PM 900 tablet 0   ciclopirox (PENLAC) 8 % solution Apply 1 Application topically at bedtime.     clonazePAM (KLONOPIN) 0.5 MG tablet Take 1 tablet (0.5 mg total) by mouth at bedtime. 5 tablet 0   cyanocobalamin (VITAMIN B12) 1000 MCG tablet Take 1,000 mcg by mouth daily.     diltiazem (CARDIZEM) 30 MG tablet Take 1 tablet (30 mg total) by mouth 2 (two) times daily. Do not take if systolic blood pressure (top number) is less than 100, or if pulse if less than 55. 60 tablet 3   ELIQUIS 5 MG TABS tablet TAKE 1 TABLET TWICE A DAY 120 tablet 5   Glycerin-Hypromellose-PEG 400 (VISINE DRY EYE OP) Place 1 drop into both eyes 2  (two) times daily as needed (eye irritation).      ketoconazole (NIZORAL) 2 % shampoo Apply 1 application  topically 3 (three) times a week.     leuprolide, 6 Month, (ELIGARD) 45 MG injection Inject 45 mg into the skin every 6 (six) months.     lidocaine-prilocaine (EMLA) cream Apply to port site 1 to 2 hours prior to appointments/treatment 30 g 3   omeprazole (PRILOSEC) 20 MG capsule Take 1 capsule (20 mg total) by mouth daily. 90 capsule 3   ondansetron (ZOFRAN) 4 MG tablet Take 1 tablet (4 mg total) by mouth every 6 (six) hours as needed for nausea. 20 tablet 0   oxyCODONE (OXY IR/ROXICODONE) 5 MG immediate release tablet Take 1 tablet (5 mg total) by mouth every 6 (six) hours as needed for severe pain (pain score 7-10). 60 tablet 0   PANCREAZE 37000-97300 units CPEP TAKE 2 CAPSULES WITH BREAKFAST, LUNCH, AND EVENING MEAL (WITH EACH MEAL) AND 1 CAPSULE WITH SNACK 600 capsule 5   polyethylene glycol powder (MIRALAX) 17 GM/SCOOP powder Take 17 g by mouth  in the morning, at noon, and at bedtime.     prochlorperazine (COMPAZINE) 10 MG tablet Take 1 tablet (10 mg total) by mouth every 6 (six) hours as needed for nausea or vomiting. 30 tablet 2   rosuvastatin (CRESTOR) 10 MG tablet TAKE 1 TABLET DAILY 90 tablet 1   tiZANidine (ZANAFLEX) 2 MG tablet Take 1 tablet (2 mg total) by mouth every 8 (eight) hours as needed for muscle spasms. 10 tablet 0   triamcinolone cream (KENALOG) 0.1 % Apply 1 application topically 2 (two) times daily. (Patient taking differently: Apply 1 application  topically 2 (two) times daily as needed (itching).) 453.6 g 1   zoledronic acid (RECLAST) 5 MG/100ML SOLN injection Inject 5 mg into the vein See admin instructions. Once a year     No current facility-administered medications for this visit.    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 Sclerotic 1.5 cm left ischial lesion/ Sclerotic medial left iliac  bone 1.5 cm lesion (series 2/image 47), increased from 1.0 cm. Gleason score of 4+5= 9; with almost all cores involved greater than 80%.  9/16 Lupron 31-month depot on 9/16. [Urology; Dr.Siniski]  # MID OCT 2020- Zytiga 1000 mg+ prednisone; stopped December 2021 [poor tolerance if with RVR]; DISCONTINUED.   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       Prostate cancer metastatic to bone (HCC)  05/24/2019 Initial Diagnosis   Prostate cancer metastatic to bone [facility])   Pancreatic cancer (HCC)  10/02/2021 Imaging   CT ABDOMEN PELVIS W CONTRAST   IMPRESSION: 1. There is new pancreatic ductal dilation with an indeterminate 19 mm hypodense low-density mass area in the head/uncinate process of the pancreas. Recommend further evaluation with dedicated MRI/MRCP with contrast. 2. No evidence of bowel obstruction.  Moderate rectal stool ball. 3. Scattered peripherally located clustered pulmonary nodules in the RIGHT middle lobe. Findings are likely infectious or inflammatory in etiology. Given history of malignancy, recommend follow-up as per clinical protocol.   10/28/2021 Imaging   MR ABDOMEN MRCP W WO CONTAST   IMPRESSION: 1. Examination is significantly limited by breath motion artifact throughout. 2. The main pancreatic duct is diffusely dilated from the level of the superior pancreatic head, measuring up to 0.7 cm. 3. In the inferior pancreatic head and uncinate, there is a multilobulated, fluid signal cystic lesion measuring 1.8 x 1.0 cm. Due to breath motion artifact it is difficult to determine whether this communicates to the adjacent duct. There are multiple additional subcentimeter cystic lesions scattered throughout the pancreas, several of which clearly communicate to  the main pancreatic duct. 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 Recommend EUS/FNA for further diagnosis given the presence of pancreatic ductal dilatation. 4. Status post left nephrectomy. 5. No evidence of recurrent or metastatic disease in the abdomen.   12/25/2021 Procedure   UPPER ENDOSCOPIC ULTRASOUND-By Dr. Meridee Score  - A mass-like region was identified in the pancreatic head where the pancreatic duct dilates with multiple cystic regions noted throughout the pancreas as well. 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 However, the endosonographic appearance is suspicious for potential adenocarcinoma. This was staged T2 N0 Mx by endosonographic criteria. T - No  malignant-appearing lymph nodes were visualized in the celiac region (level 20), peripancreatic region and porta hepatis region.   12/25/2021 Pathology Results   CYTOLOGY - NON PAP  CASE: [id number]   FINAL MICROSCOPIC DIAGNOSIS:  A. PANCREAS, HEAD, FINE NEEDLE ASPIRATION:  - Malignant cells consistent with adenocarcinoma     01/01/2022 Initial Diagnosis   Pancreatic cancer (HCC)   01/13/2022 - 04/01/2022 Chemotherapy   Patient is on Treatment Plan : PANCREAS Gemcitabine D1,15 q28d x 4 Cycles     01/26/2022 Cancer Staging   Staging form: Exocrine Pancreas, AJCC 8th Edition - Clinical: Stage IB (cT2, cN0, cM0) - Signed by Malachy Mood, MD on 01/26/2022 Total positive nodes: 0    Genetic Testing   [facility] CancerNext-Expanded Panel was Negative. Of note, a variant of uncertain significance was identified in the BLM gene (p.[id number]) and GALNT12 gene ([id number]). Report date is 01/26/2022.  The CancerNext-Expanded gene panel offered by [facility] and includes sequencing, rearrangement, and RNA analysis for the following 77 genes: AIP, ALK, APC, ATM, AXIN2, BAP1, BARD1, BLM, BMPR1A, BRCA1, BRCA2, BRIP1, CDC73, CDH1, CDK4, CDKN1B, CDKN2A, CHEK2,  CTNNA1, DICER1, FANCC, FH, FLCN, GALNT12, KIF1B, LZTR1, MAX, MEN1, MET, MLH1, MSH2, MSH3, MSH6, MUTYH, NBN, NF1, NF2, NTHL1, PALB2, PHOX2B, PMS2, POT1, PRKAR1A, PTCH1, PTEN, RAD51C, RAD51D, RB1, RECQL, RET, SDHA, SDHAF2, SDHB, SDHC, SDHD, SMAD4, SMARCA4, SMARCB1, SMARCE1, STK11, SUFU, TMEM127, TP53, TSC1, TSC2, VHL and XRCC2 (sequencing and deletion/duplication); EGFR, EGLN1, HOXB13, KIT, MITF, PDGFRA, POLD1, and POLE (sequencing only); EPCAM and GREM1 (deletion/duplication only).    08/12/2022 Imaging    IMPRESSION: 1. Slight interval increase in size of the pancreatic head-uncinate process mass with increasing direct contact of the SMA and portal vein as discussed. Also with a replaced RIGHT hepatic artery which passes through the tumor. 2. No signs of acute inflammation currently about the pancreas. With similar appearance of ductal obstruction and peripheral atrophy of pancreas due to the tumor. 3. Post LEFT nephrectomy. 4. Sclerotic bony lesions of the LEFT ischium and LEFT iliac bone similar to prior imaging. 5. Mild hyperenhancement of LEFT prostate 14 mm with asymmetry, nonspecific. This is unchanged in this patient with history of prostate neoplasm. 6. Aortic atherosclerosis.   07/29/2023 -  Chemotherapy   Patient is on Treatment Plan : PANCREATIC Abraxane D1,8,15 + Gemcitabine D1,8,15 q28d     08/03/2023 Imaging   CT chest abdomen pelvis with contrast IMPRESSION: 1. No substantial change in size of heterogeneously enhancing pancreatic head mass with adjacent fiducials. 2. Slightly increased main pancreatic ductal dilation and mild intrahepatic bile duct dilation. 3. No evidence of new or progressive metastatic disease in the abdomen or pelvis. 4. Similar mildly sclerotic focus in the left ischial tuberosity dating back to 01/07/2019. A left iliac bone faintly sclerotic lesion measuring 1.4 cm has decreased in density since 2020, likely treated osseous metastasis. 5. Left lower  lobe nodules measuring up to 5 mm are unchanged. 6. Aortic Atherosclerosis (ICD10-I70.0).         REVIEW OF SYSTEMS:   Constitutional: Denies fevers, chills or abnormal weight loss Eyes: Denies blurriness of vision Ears, nose, mouth, throat, and face: Denies mucositis or sore throat Respiratory: Denies cough, dyspnea or wheezes Cardiovascular: Denies palpitation, chest discomfort or lower extremity swelling.  Blood pressure low today. Gastrointestinal:  Denies nausea, heartburn or change in bowel habits Skin: Denies abnormal skin rashes Lymphatics: Denies new lymphadenopathy or easy bruising Neurological:Denies numbness, tingling or new weaknesses Behavioral/Psych: Mood is stable, no new changes  All other  systems were reviewed with the patient and are negative.   VITALS:   Today's Vitals   08/13/23 0929 08/13/23 0947  BP: (!) 79/52 (!) 88/60  Pulse: (!) 52   Resp: 17   Temp: 98.1 F (36.7 C)   TempSrc: Temporal   SpO2: 98%   Weight: 148 lb 11.2 oz (67.4 kg)   PainSc:  0-No pain   Body mass index is 24.74 kg/m.   Wt Readings from Last 3 Encounters:  08/13/23 148 lb 11.2 oz (67.4 kg)  07/31/23 161 lb 6 oz (73.2 kg)  07/29/23 149 lb 8 oz (67.8 kg)    Body mass index is 24.74 kg/m.  Performance status (ECOG): 1 - Symptomatic but completely ambulatory  PHYSICAL EXAM:   GENERAL:alert, no distress and comfortable SKIN: skin color, texture, turgor are normal, no rashes or significant lesions EYES: normal, Conjunctiva are pink and non-injected, sclera clear OROPHARYNX:no exudate, no erythema and lips, buccal mucosa, and tongue normal  NECK: supple, thyroid normal size, non-tender, without nodularity LYMPH:  no palpable lymphadenopathy in the cervical, axillary or inguinal LUNGS: clear to auscultation and percussion with normal breathing effort HEART: regular rate & rhythm and no murmurs and no lower extremity edema ABDOMEN:abdomen soft, non-tender and normal bowel  sounds Musculoskeletal:no cyanosis of digits and no clubbing  NEURO: alert & oriented x 3 with fluent speech, no focal motor/sensory deficits  LABORATORY DATA:  I have reviewed the data as listed    Component Value Date/Time   NA 141 08/13/2023 0908   NA 139 12/04/2021 1359   K 4.2 08/13/2023 0908   CL 111 08/13/2023 0908   CO2 27 08/13/2023 0908   GLUCOSE 172 (H) 08/13/2023 0908   BUN 17 08/13/2023 0908   BUN 20 12/04/2021 1359   CREATININE 0.91 08/13/2023 0908   CALCIUM 9.0 08/13/2023 0908   PROT 5.8 (L) 08/13/2023 0908   PROT 6.8 12/04/2021 1359   ALBUMIN 3.5 08/13/2023 0908   ALBUMIN 4.6 12/04/2021 1359   AST 27 08/13/2023 0908   ALT 6 08/13/2023 0908   ALKPHOS 51 08/13/2023 0908   BILITOT 0.7 08/13/2023 0908   GFRNONAA >60 08/13/2023 0908   GFRAA 47 (L) 05/06/2020 0935     Lab Results  Component Value Date   WBC 3.8 (L) 08/13/2023   NEUTROABS 1.9 08/13/2023   HGB 10.6 (L) 08/13/2023   HCT 33.5 (L) 08/13/2023   MCV 95.2 08/13/2023   PLT 238 08/13/2023      Chemistry    RADIOGRAPHIC STUDIES: CT ABDOMEN PELVIS W CONTRAST  Result Date: 08/13/2023 CLINICAL DATA:  History of metastatic prostate cancer and pancreatic cancer status post chemoradiation. * Tracking Code: BO * EXAM: CT ABDOMEN AND PELVIS WITH CONTRAST TECHNIQUE: Multidetector CT imaging of the abdomen and pelvis was performed using the standard protocol following bolus administration of intravenous contrast. RADIATION DOSE REDUCTION: This exam was performed according to the departmental dose-optimization program which includes automated exposure control, adjustment of the mA and/or kV according to patient size and/or use of iterative reconstruction technique. CONTRAST:  OMNIPAQUE IOHEXOL 300 MG/ML  SOLN COMPARISON:  CT abdomen and pelvis dated 05/17/2023, nuclear medicine PET dated 06/03/2023 CT chest dated 07/01/2023 and multiple priors FINDINGS: Lower chest: Left lower lobe nodules measuring up to 5 mm  (4:4, 13) are unchanged. No pleural effusion or pneumothorax demonstrated. Partially imaged heart size is normal. Hepatobiliary: Unchanged hepatic cysts. No new focal hepatic lesions. Mild intrahepatic bile duct dilation, increased from 05/17/2023. Unchanged Hyperattenuating  material within the gallbladder, likely sludge/stones. Pancreas: Slightly increased main pancreatic ductal dilation measuring 17 mm in the neck/body, previously 15 mm. Atrophy of the pancreatic body and tail. No substantial change in size of heterogeneously enhancing pancreatic head mass with adjacent fiducials measuring 2.3 x 1.8 cm (5:38) when remeasured similarly. Spleen: Normal in size without focal abnormality. Adrenals/Urinary Tract: No adrenal nodules. Prior left nephrectomy. No suspicious renal mass, calculi or hydronephrosis. No focal bladder wall thickening. Stomach/Bowel: Normal appearance of the stomach. No evidence of bowel wall thickening, distention, or inflammatory changes. Moderate volume stool throughout the colon. Appendectomy. Vascular/Lymphatic: Aortic atherosclerosis. No enlarged abdominal or pelvic lymph nodes. Reproductive: Normal prostate gland size. Mildly heterogeneous enhancement. Other: No free fluid, fluid collection, or free air. Musculoskeletal: Similar mildly sclerotic focus in the left ischial tuberosity (5:119) dating back to 01/07/2019. A left iliac bone faintly sclerotic lesion measuring 1.4 cm (5:71) has decreased in density since 2020. Multilevel degenerative changes of the partially imaged thoracic and lumbar spine. Multilevel vertebral augmentation. Right hip arthroplasty. IMPRESSION: 1. No substantial change in size of heterogeneously enhancing pancreatic head mass with adjacent fiducials. 2. Slightly increased main pancreatic ductal dilation and mild intrahepatic bile duct dilation. 3. No evidence of new or progressive metastatic disease in the abdomen or pelvis. 4. Similar mildly sclerotic focus in the  left ischial tuberosity dating back to 01/07/2019. A left iliac bone faintly sclerotic lesion measuring 1.4 cm has decreased in density since 2020, likely treated osseous metastasis. 5. Left lower lobe nodules measuring up to 5 mm are unchanged. 6. Aortic Atherosclerosis (ICD10-I70.0). Electronically Signed   By: Agustin Cree M.D.   On: 08/13/2023 08:37   DG Chest Port 1 View  Result Date: 07/31/2023 CLINICAL DATA:  Questionable sepsis - evaluate for abnormality. EXAM: PORTABLE CHEST 1 VIEW COMPARISON:  07/05/2023. FINDINGS: Bilateral lung fields are clear. No acute consolidation or lung collapse. No pulmonary edema. Bilateral costophrenic angles are clear. Stable cardio-mediastinal silhouette. No acute osseous abnormalities. The soft tissues are within normal limits. Right-sided CT Port-A-Cath is seen with its tip overlying the upper portion of superior vena cava. IMPRESSION: *No active disease. Electronically Signed   By: Jules Schick M.D.   On: 07/31/2023 13:39   IR IMAGING GUIDED PORT INSERTION  Result Date: 07/28/2023 INDICATION: 82 year old male with metastatic pancreatic cancer. He presents for port catheter placement. EXAM: IMPLANTED PORT A CATH PLACEMENT WITH ULTRASOUND AND FLUOROSCOPIC GUIDANCE MEDICATIONS: None. ANESTHESIA/SEDATION: Versed 2 mg IV; Fentanyl 75 mcg IV; administered by the radiology nurse Moderate Sedation Time:  15 minutes The patient's vital signs and level of consciousness were continuously monitored during the procedure by the interventional radiology nurse under my direct supervision. FLUOROSCOPY: Radiation exposure index: 1 mGy reference air kerma COMPLICATIONS: None immediate. PROCEDURE: The right neck and chest was prepped with chlorhexidine, and draped in the usual sterile fashion using maximum barrier technique (cap and mask, sterile gown, sterile gloves, large sterile sheet, hand hygiene and cutaneous antiseptic). Local anesthesia was attained by infiltration with 1%  lidocaine with epinephrine. Ultrasound demonstrated patency of the right internal jugular vein, and this was documented with an image. Under real-time ultrasound guidance, this vein was accessed with a 21 gauge micropuncture needle and image documentation was performed. A small dermatotomy was made at the access site with an 11 scalpel. A 0.018" wire was advanced into the SVC and the access needle exchanged for a 88F micropuncture vascular sheath. The 0.018" wire was then removed and a 0.035" wire advanced  into the IVC. An appropriate location for the subcutaneous reservoir was selected below the clavicle and an incision was made through the skin and underlying soft tissues. The subcutaneous tissues were then dissected using a combination of blunt and sharp surgical technique and a pocket was formed. A single lumen power injectable portacatheter [facility]) was then tunneled through the subcutaneous tissues from the pocket to the dermatotomy and the port reservoir placed within the subcutaneous pocket. The venous access site was then serially dilated and a peel away vascular sheath placed over the wire. The wire was removed and the port catheter advanced into position under fluoroscopic guidance. The catheter tip is positioned in the superior cavoatrial junction. This was documented with a spot image. The portacatheter was then tested and found to flush and aspirate well. The port was flushed with saline followed by 100 units/mL heparinized saline. The pocket was then closed in two layers using first subdermal inverted interrupted absorbable sutures followed by a running subcuticular suture. The epidermis was then sealed with Dermabond. The dermatotomy at the venous access site was also closed with Dermabond. IMPRESSION: Successful placement of a right IJ approach Smart Port with ultrasound and fluoroscopic guidance. The catheter is ready for use. Electronically Signed   By: Malachy Moan M.D.    On: 07/28/2023 16:02

## 2023-08-13 ENCOUNTER — Inpatient Hospital Stay: Payer: Medicare Other | Attending: Internal Medicine

## 2023-08-13 ENCOUNTER — Inpatient Hospital Stay: Payer: Medicare Other

## 2023-08-13 ENCOUNTER — Inpatient Hospital Stay (HOSPITAL_BASED_OUTPATIENT_CLINIC_OR_DEPARTMENT_OTHER): Payer: Medicare Other | Attending: Internal Medicine | Admitting: Nurse Practitioner

## 2023-08-13 VITALS — BP 139/82 | HR 50 | Resp 16

## 2023-08-13 VITALS — BP 88/60 | HR 52 | Temp 98.1°F | Resp 17 | Wt 148.7 lb

## 2023-08-13 DIAGNOSIS — Z96641 Presence of right artificial hip joint: Secondary | ICD-10-CM | POA: Insufficient documentation

## 2023-08-13 DIAGNOSIS — Z95828 Presence of other vascular implants and grafts: Secondary | ICD-10-CM

## 2023-08-13 DIAGNOSIS — Z7901 Long term (current) use of anticoagulants: Secondary | ICD-10-CM | POA: Insufficient documentation

## 2023-08-13 DIAGNOSIS — C250 Malignant neoplasm of head of pancreas: Secondary | ICD-10-CM | POA: Insufficient documentation

## 2023-08-13 DIAGNOSIS — K7689 Other specified diseases of liver: Secondary | ICD-10-CM | POA: Insufficient documentation

## 2023-08-13 DIAGNOSIS — C61 Malignant neoplasm of prostate: Secondary | ICD-10-CM | POA: Insufficient documentation

## 2023-08-13 DIAGNOSIS — Z5112 Encounter for antineoplastic immunotherapy: Secondary | ICD-10-CM | POA: Insufficient documentation

## 2023-08-13 DIAGNOSIS — C7951 Secondary malignant neoplasm of bone: Secondary | ICD-10-CM | POA: Diagnosis not present

## 2023-08-13 DIAGNOSIS — Z923 Personal history of irradiation: Secondary | ICD-10-CM | POA: Diagnosis not present

## 2023-08-13 DIAGNOSIS — Z9221 Personal history of antineoplastic chemotherapy: Secondary | ICD-10-CM | POA: Diagnosis not present

## 2023-08-13 DIAGNOSIS — M47816 Spondylosis without myelopathy or radiculopathy, lumbar region: Secondary | ICD-10-CM | POA: Diagnosis not present

## 2023-08-13 DIAGNOSIS — Z887 Allergy status to serum and vaccine status: Secondary | ICD-10-CM | POA: Insufficient documentation

## 2023-08-13 DIAGNOSIS — N183 Chronic kidney disease, stage 3 unspecified: Secondary | ICD-10-CM | POA: Insufficient documentation

## 2023-08-13 DIAGNOSIS — K861 Other chronic pancreatitis: Secondary | ICD-10-CM | POA: Diagnosis not present

## 2023-08-13 DIAGNOSIS — R232 Flushing: Secondary | ICD-10-CM | POA: Diagnosis not present

## 2023-08-13 DIAGNOSIS — G20A1 Parkinson's disease without dyskinesia, without mention of fluctuations: Secondary | ICD-10-CM | POA: Insufficient documentation

## 2023-08-13 DIAGNOSIS — I700 Atherosclerosis of aorta: Secondary | ICD-10-CM | POA: Diagnosis not present

## 2023-08-13 DIAGNOSIS — Z79633 Long term (current) use of mitotic inhibitor: Secondary | ICD-10-CM | POA: Insufficient documentation

## 2023-08-13 DIAGNOSIS — R918 Other nonspecific abnormal finding of lung field: Secondary | ICD-10-CM | POA: Insufficient documentation

## 2023-08-13 DIAGNOSIS — Z905 Acquired absence of kidney: Secondary | ICD-10-CM | POA: Diagnosis not present

## 2023-08-13 DIAGNOSIS — M47814 Spondylosis without myelopathy or radiculopathy, thoracic region: Secondary | ICD-10-CM | POA: Diagnosis not present

## 2023-08-13 DIAGNOSIS — Z79899 Other long term (current) drug therapy: Secondary | ICD-10-CM | POA: Insufficient documentation

## 2023-08-13 LAB — CMP (CANCER CENTER ONLY)
ALT: 6 U/L (ref 0–44)
AST: 27 U/L (ref 15–41)
Albumin: 3.5 g/dL (ref 3.5–5.0)
Alkaline Phosphatase: 51 U/L (ref 38–126)
Anion gap: 3 — ABNORMAL LOW (ref 5–15)
BUN: 17 mg/dL (ref 8–23)
CO2: 27 mmol/L (ref 22–32)
Calcium: 9 mg/dL (ref 8.9–10.3)
Chloride: 111 mmol/L (ref 98–111)
Creatinine: 0.91 mg/dL (ref 0.61–1.24)
GFR, Estimated: >60 mL/min (ref >60)
Glucose, Bld: 172 mg/dL — ABNORMAL HIGH (ref 70–99)
Potassium: 4.2 mmol/L (ref 3.5–5.1)
Sodium: 141 mmol/L (ref 135–145)
Total Bilirubin: 0.7 mg/dL (ref <1.2)
Total Protein: 5.8 g/dL — ABNORMAL LOW (ref 6.5–8.1)

## 2023-08-13 LAB — CBC WITH DIFFERENTIAL (CANCER CENTER ONLY)
Abs Immature Granulocytes: 0.07 10*3/uL (ref 0.00–0.07)
Basophils Absolute: 0 10*3/uL (ref 0.0–0.1)
Basophils Relative: 1 %
Eosinophils Absolute: 0.2 10*3/uL (ref 0.0–0.5)
Eosinophils Relative: 5 %
HCT: 33.5 % — ABNORMAL LOW (ref 39.0–52.0)
Hemoglobin: 10.6 g/dL — ABNORMAL LOW (ref 13.0–17.0)
Immature Granulocytes: 2 %
Lymphocytes Relative: 24 %
Lymphs Abs: 0.9 10*3/uL (ref 0.7–4.0)
MCH: 30.1 pg (ref 26.0–34.0)
MCHC: 31.6 g/dL (ref 30.0–36.0)
MCV: 95.2 fL (ref 80.0–100.0)
Monocytes Absolute: 0.7 10*3/uL (ref 0.1–1.0)
Monocytes Relative: 19 %
Neutro Abs: 1.9 10*3/uL (ref 1.7–7.7)
Neutrophils Relative %: 49 %
Platelet Count: 238 10*3/uL (ref 150–400)
RBC: 3.52 MIL/uL — ABNORMAL LOW (ref 4.22–5.81)
RDW: 15.9 % — ABNORMAL HIGH (ref 11.5–15.5)
WBC Count: 3.8 10*3/uL — ABNORMAL LOW (ref 4.0–10.5)
nRBC: 0 % (ref 0.0–0.2)

## 2023-08-13 MED ORDER — HEPARIN SOD (PORK) LOCK FLUSH 100 UNIT/ML IV SOLN
500.0000 [IU] | Freq: Once | INTRAVENOUS | Status: AC | PRN
  Administered 2023-08-13: 500 [IU]

## 2023-08-13 MED ORDER — SODIUM CHLORIDE 0.9 % IV SOLN
800.0000 mg/m2 | Freq: Once | INTRAVENOUS | Status: AC
  Administered 2023-08-13: 1406 mg via INTRAVENOUS
  Filled 2023-08-13: qty 36.98

## 2023-08-13 MED ORDER — SODIUM CHLORIDE 0.9% FLUSH
10.0000 mL | INTRAVENOUS | Status: DC | PRN
  Administered 2023-08-13: 10 mL

## 2023-08-13 MED ORDER — LIDOCAINE-PRILOCAINE 2.5-2.5 % EX CREA: TOPICAL_CREAM | CUTANEOUS | 3 refills | Status: DC

## 2023-08-13 MED ORDER — SODIUM CHLORIDE 0.9 % IV SOLN
INTRAVENOUS | Status: DC
Start: 2023-08-13 — End: 2023-08-13

## 2023-08-13 MED ORDER — PACLITAXEL PROTEIN-BOUND CHEMO INJECTION 100 MG
100.0000 mg/m2 | Freq: Once | INTRAVENOUS | Status: AC
  Administered 2023-08-13: 175 mg via INTRAVENOUS
  Filled 2023-08-13: qty 35

## 2023-08-13 MED ORDER — SODIUM CHLORIDE 0.9% FLUSH
10.0000 mL | Freq: Once | INTRAVENOUS | Status: AC
Start: 2023-08-13 — End: 2023-08-13
  Administered 2023-08-13: 10 mL

## 2023-08-13 MED ORDER — PROCHLORPERAZINE MALEATE 10 MG PO TABS
10.0000 mg | ORAL_TABLET | Freq: Once | ORAL | Status: AC
Start: 2023-08-13 — End: 2023-08-13
  Administered 2023-08-13: 10 mg via ORAL
  Filled 2023-08-13: qty 1

## 2023-08-13 MED ORDER — SODIUM CHLORIDE 0.9 % IV SOLN: INTRAVENOUS | Status: DC

## 2023-08-13 NOTE — Progress Notes (Signed)
Per Herbert Seta NP Pt to receive 500 mLs IVFs today with tx for low BP. Per Herbert Seta NP OK to infuse IVFs at 999 mL/hr.

## 2023-08-13 NOTE — Assessment & Plan Note (Signed)
-  diagnosed in 09/2021, deemed poor surgical candidate due to his age and medical co-morbidities -S/p single agent gemcitabine 01/13/22 - 04/01/22 and 5 fx SBRT [name]) 05/04/22 - 05/14/22. CA 19-9 decreased after treatment -CT scan from May 17, 2023 showed multiple lung nodules, with dominant 1.5 cm nodule in the right middle lobe, highly concerning for metastasis.  His lung lesions are positive on PET scan. -Bronchoscopy and biopsy attempted, which showed atypical cells. -He restarted chemo gemcitabine and abraxane on 07/29/2023 -followed by Dr. Mosetta Putt

## 2023-08-13 NOTE — Patient Instructions (Signed)
[facility] - A DEPT OF [facility]  Discharge Instructions: Thank you for choosing [facility] to provide your oncology and hematology care.   If you have a lab appointment with the Cancer Center, please go directly to the Cancer Center and check in at the registration area.   Wear comfortable clothing and clothing appropriate for easy access to any Portacath or PICC line.   We strive to give you quality time with your provider. You may need to reschedule your appointment if you arrive late (15 or more minutes).  Arriving late affects you and other patients whose appointments are after yours.  Also, if you miss three or more appointments without notifying the office, you may be dismissed from the clinic at the provider's discretion.      For prescription refill requests, have your pharmacy contact our office and allow 72 hours for refills to be completed.    Today you received the following chemotherapy and/or immunotherapy agents: Abraxane/Gemzar      To help prevent nausea and vomiting after your treatment, we encourage you to take your nausea medication as directed.  BELOW ARE SYMPTOMS THAT SHOULD BE REPORTED IMMEDIATELY: *FEVER GREATER THAN 100.4 F (38 C) OR HIGHER *CHILLS OR SWEATING *NAUSEA AND VOMITING THAT IS NOT CONTROLLED WITH YOUR NAUSEA MEDICATION *UNUSUAL SHORTNESS OF BREATH *UNUSUAL BRUISING OR BLEEDING *URINARY PROBLEMS (pain or burning when urinating, or frequent urination) *BOWEL PROBLEMS (unusual diarrhea, constipation, pain near the anus) TENDERNESS IN MOUTH AND THROAT WITH OR WITHOUT PRESENCE OF ULCERS (sore throat, sores in mouth, or a toothache) UNUSUAL RASH, SWELLING OR PAIN  UNUSUAL VAGINAL DISCHARGE OR ITCHING   Items with * indicate a potential emergency and should be followed up as soon as possible or go to the Emergency Department if any problems should occur.  Please show the CHEMOTHERAPY ALERT CARD or  IMMUNOTHERAPY ALERT CARD at check-in to the Emergency Department and triage nurse.  Should you have questions after your visit or need to cancel or reschedule your appointment, please contact [facility]  Dept: [phone]  and follow the prompts.  Office hours are 8:00 a.m. to 4:30 p.m. Monday - Friday. Please note that voicemails left after 4:00 p.m. may not be returned until the following business day.  We are closed weekends and major holidays. You have access to a nurse at all times for urgent questions. Please call the main number to the clinic Dept: [phone] and follow the prompts.   For any non-urgent questions, you may also contact your provider using MyChart. We now offer e-Visits for anyone 13 and older to request care online for non-urgent symptoms. For details visit [email or web address].   Also download the MyChart app! Go to the app store, search "MyChart", open the app, select [facility], and log in with your MyChart username and password.

## 2023-08-15 ENCOUNTER — Encounter: Payer: Self-pay | Admitting: Nurse Practitioner

## 2023-08-15 ENCOUNTER — Encounter: Payer: Self-pay | Admitting: Hematology

## 2023-08-15 ENCOUNTER — Encounter: Payer: Self-pay | Admitting: Internal Medicine

## 2023-08-17 DIAGNOSIS — M47816 Spondylosis without myelopathy or radiculopathy, lumbar region: Secondary | ICD-10-CM | POA: Diagnosis not present

## 2023-08-26 DIAGNOSIS — H25813 Combined forms of age-related cataract, bilateral: Secondary | ICD-10-CM | POA: Diagnosis not present

## 2023-08-26 DIAGNOSIS — H5213 Myopia, bilateral: Secondary | ICD-10-CM | POA: Diagnosis not present

## 2023-08-26 DIAGNOSIS — H31002 Unspecified chorioretinal scars, left eye: Secondary | ICD-10-CM | POA: Diagnosis not present

## 2023-08-27 ENCOUNTER — Inpatient Hospital Stay (HOSPITAL_BASED_OUTPATIENT_CLINIC_OR_DEPARTMENT_OTHER): Payer: Medicare Other | Admitting: Nurse Practitioner

## 2023-08-27 ENCOUNTER — Inpatient Hospital Stay: Payer: Medicare Other

## 2023-08-27 ENCOUNTER — Other Ambulatory Visit: Payer: Self-pay

## 2023-08-27 ENCOUNTER — Telehealth: Payer: Self-pay | Admitting: Cardiology

## 2023-08-27 VITALS — BP 108/78 | Temp 97.7°F | Resp 15 | Ht 65.0 in | Wt 149.2 lb

## 2023-08-27 DIAGNOSIS — C250 Malignant neoplasm of head of pancreas: Secondary | ICD-10-CM

## 2023-08-27 DIAGNOSIS — C259 Malignant neoplasm of pancreas, unspecified: Secondary | ICD-10-CM

## 2023-08-27 DIAGNOSIS — Z5112 Encounter for antineoplastic immunotherapy: Secondary | ICD-10-CM | POA: Diagnosis not present

## 2023-08-27 DIAGNOSIS — Z95828 Presence of other vascular implants and grafts: Secondary | ICD-10-CM

## 2023-08-27 DIAGNOSIS — I700 Atherosclerosis of aorta: Secondary | ICD-10-CM | POA: Diagnosis not present

## 2023-08-27 DIAGNOSIS — C61 Malignant neoplasm of prostate: Secondary | ICD-10-CM | POA: Diagnosis not present

## 2023-08-27 DIAGNOSIS — R918 Other nonspecific abnormal finding of lung field: Secondary | ICD-10-CM | POA: Diagnosis not present

## 2023-08-27 DIAGNOSIS — C7951 Secondary malignant neoplasm of bone: Secondary | ICD-10-CM | POA: Diagnosis not present

## 2023-08-27 LAB — CBC WITH DIFFERENTIAL (CANCER CENTER ONLY)
Abs Immature Granulocytes: 0.04 10*3/uL (ref 0.00–0.07)
Basophils Absolute: 0 10*3/uL (ref 0.0–0.1)
Basophils Relative: 1 %
Eosinophils Absolute: 0.1 10*3/uL (ref 0.0–0.5)
Eosinophils Relative: 4 %
HCT: 32.5 % — ABNORMAL LOW (ref 39.0–52.0)
Hemoglobin: 10.5 g/dL — ABNORMAL LOW (ref 13.0–17.0)
Immature Granulocytes: 1 %
Lymphocytes Relative: 42 %
Lymphs Abs: 1.2 10*3/uL (ref 0.7–4.0)
MCH: 30.7 pg (ref 26.0–34.0)
MCHC: 32.3 g/dL (ref 30.0–36.0)
MCV: 95 fL (ref 80.0–100.0)
Monocytes Absolute: 0.6 10*3/uL (ref 0.1–1.0)
Monocytes Relative: 20 %
Neutro Abs: 1 10*3/uL — ABNORMAL LOW (ref 1.7–7.7)
Neutrophils Relative %: 32 %
Platelet Count: 243 10*3/uL (ref 150–400)
RBC: 3.42 MIL/uL — ABNORMAL LOW (ref 4.22–5.81)
RDW: 16 % — ABNORMAL HIGH (ref 11.5–15.5)
WBC Count: 2.9 10*3/uL — ABNORMAL LOW (ref 4.0–10.5)
nRBC: 0 % (ref 0.0–0.2)

## 2023-08-27 LAB — CMP (CANCER CENTER ONLY)
ALT: 6 U/L (ref 0–44)
AST: 24 U/L (ref 15–41)
Albumin: 3.8 g/dL (ref 3.5–5.0)
Alkaline Phosphatase: 59 U/L (ref 38–126)
Anion gap: 4 — ABNORMAL LOW (ref 5–15)
BUN: 19 mg/dL (ref 8–23)
CO2: 27 mmol/L (ref 22–32)
Calcium: 9.4 mg/dL (ref 8.9–10.3)
Chloride: 112 mmol/L — ABNORMAL HIGH (ref 98–111)
Creatinine: 1.02 mg/dL (ref 0.61–1.24)
GFR, Estimated: >60 mL/min (ref >60)
Glucose, Bld: 73 mg/dL (ref 70–99)
Potassium: 4.3 mmol/L (ref 3.5–5.1)
Sodium: 143 mmol/L (ref 135–145)
Total Bilirubin: 0.5 mg/dL (ref <1.2)
Total Protein: 6.2 g/dL — ABNORMAL LOW (ref 6.5–8.1)

## 2023-08-27 MED ORDER — SODIUM CHLORIDE 0.9 % IV SOLN
800.0000 mg/m2 | Freq: Once | INTRAVENOUS | Status: AC
  Administered 2023-08-27: 1406 mg via INTRAVENOUS
  Filled 2023-08-27: qty 36.98

## 2023-08-27 MED ORDER — SODIUM CHLORIDE 0.9% FLUSH
10.0000 mL | Freq: Once | INTRAVENOUS | Status: AC
  Administered 2023-08-27: 10 mL

## 2023-08-27 MED ORDER — PROCHLORPERAZINE MALEATE 10 MG PO TABS
10.0000 mg | ORAL_TABLET | Freq: Once | ORAL | Status: AC
  Administered 2023-08-27: 10 mg via ORAL
  Filled 2023-08-27: qty 1

## 2023-08-27 MED ORDER — SODIUM CHLORIDE 0.9 % IV SOLN: INTRAVENOUS | Status: DC

## 2023-08-27 MED ORDER — SODIUM CHLORIDE 0.9% FLUSH
10.0000 mL | INTRAVENOUS | Status: DC | PRN
  Administered 2023-08-27: 10 mL

## 2023-08-27 MED ORDER — HEPARIN SOD (PORK) LOCK FLUSH 100 UNIT/ML IV SOLN
500.0000 [IU] | Freq: Once | INTRAVENOUS | Status: AC | PRN
  Administered 2023-08-27: 500 [IU]

## 2023-08-27 MED ORDER — PACLITAXEL PROTEIN-BOUND CHEMO INJECTION 100 MG
100.0000 mg/m2 | Freq: Once | INTRAVENOUS | Status: AC
  Administered 2023-08-27: 175 mg via INTRAVENOUS
  Filled 2023-08-27: qty 35

## 2023-08-27 NOTE — Telephone Encounter (Signed)
Spoke with Myriam Jacobson at the [facility] who states that the patient is there for an infusion and his heart rate is fluctuating between 29 and 121. She states that they did and EKG, which is in his chart. He is taking cardizem daily but holding for systolic less than 115 or heart rate less than 55. She reports that the patient is feeling fine and they are going to go ahead and give him his infusion today, but wanted cardiology to know in case he needed to be seen.

## 2023-08-27 NOTE — Progress Notes (Signed)
Ok to treat with ANC 1.0 per RN.  Dr Edger House, PharmD

## 2023-08-27 NOTE — Assessment & Plan Note (Addendum)
-  diagnosed in 09/2021, deemed poor surgical candidate due to his age and medical co-morbidities -S/p single agent gemcitabine 01/13/22 - 04/01/22 and 5 fx SBRT [name]) 05/04/22 - 05/14/22. CA 19-9 decreased after treatment -CT scan from May 17, 2023 showed multiple lung nodules, with dominant 1.5 cm nodule in the right middle lobe, highly concerning for metastasis.  His lung lesions are positive on PET scan. -Bronchoscopy and biopsy attempted, which showed atypical cells. -He restarted chemo gemcitabine and abraxane on 07/29/2023 -Restaging CT CAP from 11/29/2024S showing no changes in pancreatic head mass.  Some mild pancreatic duct and bile duct dilation.  Bony lesion of left ischial tuberosity is approximately the same.  There is a decrease in density of the left iliac bony lesion.  Multiple, small lung nodules are unchanged. Noted an irregular heart rhythm during today's visit.  A-fib noted on EKG.  This is not new for patient.  Will see cardiology on 08/25/2024. -08/27/2023 -today is cycle 2 day 1.

## 2023-08-27 NOTE — Telephone Encounter (Signed)
EKG reviewed - Afib controlled ventricular rate.  IF asymptomatic and rate are generally less than 110bpm continue outpatient treatment w/ diltiazem and anticoagulation.  IF symptomatic  or Hypotensive please refer him to ER for further evaluation.   Cletus Mehlhoff [place], DO, [facility]

## 2023-08-27 NOTE — Patient Instructions (Signed)
 [facility] - A DEPT OF [facility]  Discharge Instructions: Thank you for choosing [facility] to provide your oncology and hematology care.   If you have a lab appointment with the Cancer Center, please go directly to the Cancer Center and check in at the registration area.   Wear comfortable clothing and clothing appropriate for easy access to any Portacath or PICC line.   We strive to give you quality time with your provider. You may need to reschedule your appointment if you arrive late (15 or more minutes).  Arriving late affects you and other patients whose appointments are after yours.  Also, if you miss three or more appointments without notifying the office, you may be dismissed from the clinic at the provider's discretion.      For prescription refill requests, have your pharmacy contact our office and allow 72 hours for refills to be completed.    Today you received the following chemotherapy and/or immunotherapy agents: Abraxane/Gemzar      To help prevent nausea and vomiting after your treatment, we encourage you to take your nausea medication as directed.  BELOW ARE SYMPTOMS THAT SHOULD BE REPORTED IMMEDIATELY: *FEVER GREATER THAN 100.4 F (38 C) OR HIGHER *CHILLS OR SWEATING *NAUSEA AND VOMITING THAT IS NOT CONTROLLED WITH YOUR NAUSEA MEDICATION *UNUSUAL SHORTNESS OF BREATH *UNUSUAL BRUISING OR BLEEDING *URINARY PROBLEMS (pain or burning when urinating, or frequent urination) *BOWEL PROBLEMS (unusual diarrhea, constipation, pain near the anus) TENDERNESS IN MOUTH AND THROAT WITH OR WITHOUT PRESENCE OF ULCERS (sore throat, sores in mouth, or a toothache) UNUSUAL RASH, SWELLING OR PAIN  UNUSUAL VAGINAL DISCHARGE OR ITCHING   Items with * indicate a potential emergency and should be followed up as soon as possible or go to the Emergency Department if any problems should occur.  Please show the CHEMOTHERAPY ALERT CARD or  IMMUNOTHERAPY ALERT CARD at check-in to the Emergency Department and triage nurse.  Should you have questions after your visit or need to cancel or reschedule your appointment, please contact [facility]  Dept: [phone]  and follow the prompts.  Office hours are 8:00 a.m. to 4:30 p.m. Monday - Friday. Please note that voicemails left after 4:00 p.m. may not be returned until the following business day.  We are closed weekends and major holidays. You have access to a nurse at all times for urgent questions. Please call the main number to the clinic Dept: [phone] and follow the prompts.   For any non-urgent questions, you may also contact your provider using MyChart. We now offer e-Visits for anyone 13 and older to request care online for non-urgent symptoms. For details visit [email or web address].   Also download the MyChart app! Go to the app store, search "MyChart", open the app, select [facility], and log in with your MyChart username and password.

## 2023-08-27 NOTE — Progress Notes (Signed)
Patient Care Team: Lorenda Ishihara, MD as PCP - General (Internal Medicine) Tessa Lerner, DO as PCP - Cardiology (Cardiology) Tat, Octaviano Batty, DO as Consulting Physician (Neurology) Earna Coder, MD as Consulting Physician (Hematology and Oncology) Fritzi Mandes, MD as Consulting Physician (General Surgery) Malachy Mood, MD as Consulting Physician (Hematology and Oncology) Josephine Igo, DO as Consulting Physician (Pulmonary Disease)  Clinic Day:  09/05/2023  Referring physician: Malachy Mood, MD  ASSESSMENT & PLAN:   Assessment & Plan: Pancreatic cancer [facility]) -diagnosed in 09/2021, deemed poor surgical candidate due to his age and medical co-morbidities -S/p single agent gemcitabine 01/13/22 - 04/01/22 and 5 fx SBRT [name]) 05/04/22 - 05/14/22. CA 19-9 decreased after treatment -CT scan from May 17, 2023 showed multiple lung nodules, with dominant 1.5 cm nodule in the right middle lobe, highly concerning for metastasis.  His lung lesions are positive on PET scan. -Bronchoscopy and biopsy attempted, which showed atypical cells. -He restarted chemo gemcitabine and abraxane on 07/29/2023 -Restaging CT CAP from 11/29/2024S showing no changes in pancreatic head mass.  Some mild pancreatic duct and bile duct dilation.  Bony lesion of left ischial tuberosity is approximately the same.  There is a decrease in density of the left iliac bony lesion.  Multiple, small lung nodules are unchanged. Noted an irregular heart rhythm during today's visit.  A-fib noted on EKG.  This is not new for patient.  Will see cardiology on 08/25/2024. -08/27/2023 -today is cycle 2 day 1.   Plan: Labs reviewed  -CBC showing WBC 2.9; Hgb 10.5; Hct 32.5; Plt 243; Anc 1.0 -CMP - K 4.3; glucose 78; BUN 19; Creatinine 1.02; eGFR >60; Ca 9.4; LFTs normal.   -Reviewed CT CAP 6 showing no changes in pancreatic head mass.  Some mild pancreatic duct and bile duct dilation.  Bony lesion of left ischial tuberosity  is approximately the same.  There is a decrease in density of the left iliac bony lesion.  Multiple, small lung nodules are unchanged. Patient is clinically doing well.  Labs adequate for treatment today.  Proceed with cycle 2 day 1. Labs/flush, Follow-up, and treatment as scheduled on 10/08/2023.   The patient understands the plans discussed today and is in agreement with them.  He knows to contact our office if he develops concerns prior to his next appointment.  I provided 30 minutes of face-to-face time during this encounter and > 50% was spent counseling as documented under my assessment and plan.    Carlean Jews, NP  [facility] - A DEPT OF [facility] [address] [place] [address] Dept: [phone] Dept Fax: [phone]   No orders of the defined types were placed in this encounter.     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  Was last seen by myself on 08/13/2023.  Was treated with a total of 1 L normal saline due to significantly low blood pressure.  Recovered well prior to discharge home.  Today he is here for cycle 2 day 1.  Currently in A-fib and asymptomatic.  Reports feeling well. He denies chest pain, chest pressure, or shortness of breath.  He has neck scheduled to see cardiology in January 2025.  Take cardiac medications as prescribed.  Understands as needed parameters for Cardizem.  Did  not take this morning.  Blood pressure is running well.  He denies headaches or visual disturbances. He denies abdominal pain, nausea, vomiting, or changes in bowel or bladder habits.   He denies fevers or chills. He denies pain. His appetite is good. His weight has increased 1 pounds over last 2 weeks .  I have reviewed the past medical history, past surgical  history, social history and family history with the patient and they are unchanged from previous note.  ALLERGIES:  is allergic to influenza vaccines and influenza virus vaccine.  MEDICATIONS:  Current Outpatient Medications  Medication Sig Dispense Refill   acetaminophen (TYLENOL) 500 MG tablet Take 1 tablet (500 mg total) by mouth every 6 (six) hours as needed for moderate pain or mild pain. 30 tablet 0   ammonium lactate (LAC-HYDRIN) 12 % lotion Apply 1 Application topically as needed.     bisacodyl (DULCOLAX) 10 MG suppository Place 1 suppository (10 mg total) rectally daily as needed for moderate constipation. 12 suppository 0   carbidopa-levodopa (SINEMET CR) 50-200 MG tablet Take 1 tablet by mouth at bedtime. 90 tablet 0   carbidopa-levodopa (SINEMET IR) 25-100 MG tablet Take 3 tablets at 7 AM, 2 tablets at 11 AM, 3 tablets at 3 PM, 2 tablets at 7 PM 900 tablet 0   ciclopirox (PENLAC) 8 % solution Apply 1 Application topically at bedtime.     clonazePAM (KLONOPIN) 0.5 MG tablet Take 1 tablet (0.5 mg total) by mouth at bedtime. 5 tablet 0   cyanocobalamin (VITAMIN B12) 1000 MCG tablet Take 1,000 mcg by mouth daily.     diltiazem (CARDIZEM) 90 MG tablet Take 1 tablet (90 mg total) by mouth 2 (two) times daily. Do not take if systolic blood pressure (top number) is less than 100, or if pulse if less than 55. 90 tablet 3   ELIQUIS 5 MG TABS tablet TAKE 1 TABLET TWICE A DAY 120 tablet 5   Glycerin-Hypromellose-PEG 400 (VISINE DRY EYE OP) Place 1 drop into both eyes 2 (two) times daily as needed (eye irritation).      ketoconazole (NIZORAL) 2 % shampoo Apply 1 application  topically 3 (three) times a week.     leuprolide, 6 Month, (ELIGARD) 45 MG injection Inject 45 mg into the skin every 6 (six) months.     lidocaine-prilocaine (EMLA) cream Apply to port site 1 to 2 hours prior to appointments/treatment 30 g 3   omeprazole (PRILOSEC) 20 MG capsule Take 1 capsule (20 mg total) by mouth daily.  90 capsule 3   ondansetron (ZOFRAN) 4 MG tablet Take 1 tablet (4 mg total) by mouth every 6 (six) hours as needed for nausea. 20 tablet 0   oxyCODONE (OXY IR/ROXICODONE) 5 MG immediate release tablet Take 1 tablet (5 mg total) by mouth every 6 (six) hours as needed for severe pain (pain score 7-10). 60 tablet 0   PANCREAZE 37000-97300 units CPEP TAKE 2 CAPSULES WITH BREAKFAST, LUNCH, AND EVENING MEAL (WITH EACH MEAL) AND 1 CAPSULE WITH SNACK 600 capsule 5   polyethylene glycol powder (MIRALAX) 17 GM/SCOOP powder Take 17 g by mouth in the morning, at noon, and at bedtime.     prochlorperazine (COMPAZINE) 10 MG tablet Take 1 tablet (10 mg total) by mouth every 6 (six) hours as needed for nausea or vomiting. 30 tablet 2   rosuvastatin (CRESTOR) 10 MG tablet TAKE 1 TABLET DAILY 90 tablet 1   tiZANidine (ZANAFLEX) 2 MG tablet Take 1 tablet (2 mg  total) by mouth every 8 (eight) hours as needed for muscle spasms. 10 tablet 0   triamcinolone cream (KENALOG) 0.1 % Apply 1 application topically 2 (two) times daily. (Patient taking differently: Apply 1 application  topically 2 (two) times daily as needed (itching).) 453.6 g 1   zoledronic acid (RECLAST) 5 MG/100ML SOLN injection Inject 5 mg into the vein See admin instructions. Once a year     No current facility-administered medications for this visit.    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 Sclerotic 1.5 cm left ischial lesion/ Sclerotic medial left iliac bone 1.5 cm lesion (series 2/image 47), increased from 1.0 cm. Gleason score of 4+5= 9; with almost all cores involved greater than 80%.  9/16 Lupron 90-month depot on 9/16. [Urology; Dr.Siniski]  # MID OCT 2020- Zytiga 1000 mg+ prednisone; stopped December 2021 [poor tolerance if with RVR]; DISCONTINUED.   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       Prostate cancer metastatic to bone (HCC)  05/24/2019 Initial Diagnosis   Prostate cancer metastatic to bone [facility])   Pancreatic cancer (HCC)  10/02/2021 Imaging   CT ABDOMEN PELVIS W CONTRAST   IMPRESSION: 1. There is new pancreatic ductal dilation with an indeterminate 19 mm hypodense low-density mass area in the head/uncinate process of the pancreas. Recommend further evaluation with dedicated MRI/MRCP with contrast. 2. No evidence of bowel obstruction.  Moderate rectal stool ball. 3. Scattered peripherally located clustered pulmonary nodules in the RIGHT middle lobe. Findings are likely infectious or inflammatory in etiology. Given history of malignancy, recommend follow-up as per clinical protocol.   10/28/2021 Imaging   MR ABDOMEN MRCP W WO CONTAST   IMPRESSION: 1. Examination is significantly limited by breath motion artifact throughout. 2. The main pancreatic duct is diffusely dilated from the level of the superior pancreatic head, measuring up to 0.7 cm. 3. In the inferior pancreatic head and uncinate, there is a multilobulated, fluid signal cystic lesion measuring 1.8 x 1.0 cm. Due to breath motion artifact it is difficult to determine whether this communicates to the adjacent duct. There are multiple additional subcentimeter cystic lesions scattered throughout the pancreas, several of which clearly communicate to the main pancreatic duct. 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 Recommend EUS/FNA for further diagnosis given the presence of pancreatic ductal dilatation. 4. Status post left nephrectomy. 5. No evidence of recurrent or metastatic disease in the abdomen.   12/25/2021 Procedure   UPPER ENDOSCOPIC ULTRASOUND-By Dr. Meridee Score  - A mass-like region was identified in the pancreatic head  where the pancreatic duct dilates with multiple cystic regions noted throughout the pancreas as well. 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 However, the endosonographic appearance is suspicious for potential adenocarcinoma. This was staged T2 N0 Mx by endosonographic criteria. T - No malignant-appearing lymph nodes were visualized in the celiac region (level 20), peripancreatic region and porta hepatis region.   12/25/2021 Pathology Results   CYTOLOGY - NON PAP  CASE: [id number]   FINAL MICROSCOPIC DIAGNOSIS:  A. PANCREAS, HEAD, FINE NEEDLE ASPIRATION:  - Malignant cells consistent with adenocarcinoma     01/01/2022 Initial Diagnosis   Pancreatic cancer (HCC)  01/13/2022 - 04/01/2022 Chemotherapy   Patient is on Treatment Plan : PANCREAS Gemcitabine D1,15 q28d x 4 Cycles     01/26/2022 Cancer Staging   Staging form: Exocrine Pancreas, AJCC 8th Edition - Clinical: Stage IB (cT2, cN0, cM0) - Signed by Malachy Mood, MD on 01/26/2022 Total positive nodes: 0    Genetic Testing   [facility] CancerNext-Expanded Panel was Negative. Of note, a variant of uncertain significance was identified in the BLM gene (p.[id number]) and GALNT12 gene ([id number]). Report date is 01/26/2022.  The CancerNext-Expanded gene panel offered by [facility] and includes sequencing, rearrangement, and RNA analysis for the following 77 genes: AIP, ALK, APC, ATM, AXIN2, BAP1, BARD1, BLM, BMPR1A, BRCA1, BRCA2, BRIP1, CDC73, CDH1, CDK4, CDKN1B, CDKN2A, CHEK2, CTNNA1, DICER1, FANCC, FH, FLCN, GALNT12, KIF1B, LZTR1, MAX, MEN1, MET, MLH1, MSH2, MSH3, MSH6, MUTYH, NBN, NF1, NF2, NTHL1, PALB2, PHOX2B, PMS2, POT1, PRKAR1A, PTCH1, PTEN, RAD51C, RAD51D, RB1, RECQL, RET, SDHA, SDHAF2, SDHB, SDHC, SDHD, SMAD4, SMARCA4, SMARCB1, SMARCE1, STK11, SUFU, TMEM127, TP53, TSC1, TSC2, VHL and XRCC2 (sequencing and deletion/duplication); EGFR, EGLN1, HOXB13, KIT, MITF, PDGFRA, POLD1, and POLE (sequencing only);  EPCAM and GREM1 (deletion/duplication only).    08/12/2022 Imaging    IMPRESSION: 1. Slight interval increase in size of the pancreatic head-uncinate process mass with increasing direct contact of the SMA and portal vein as discussed. Also with a replaced RIGHT hepatic artery which passes through the tumor. 2. No signs of acute inflammation currently about the pancreas. With similar appearance of ductal obstruction and peripheral atrophy of pancreas due to the tumor. 3. Post LEFT nephrectomy. 4. Sclerotic bony lesions of the LEFT ischium and LEFT iliac bone similar to prior imaging. 5. Mild hyperenhancement of LEFT prostate 14 mm with asymmetry, nonspecific. This is unchanged in this patient with history of prostate neoplasm. 6. Aortic atherosclerosis.   07/29/2023 -  Chemotherapy   Patient is on Treatment Plan : PANCREATIC Abraxane D1,8,15 + Gemcitabine D1,8,15 q28d     08/03/2023 Imaging   CT chest abdomen pelvis with contrast IMPRESSION: 1. No substantial change in size of heterogeneously enhancing pancreatic head mass with adjacent fiducials. 2. Slightly increased main pancreatic ductal dilation and mild intrahepatic bile duct dilation. 3. No evidence of new or progressive metastatic disease in the abdomen or pelvis. 4. Similar mildly sclerotic focus in the left ischial tuberosity dating back to 01/07/2019. A left iliac bone faintly sclerotic lesion measuring 1.4 cm has decreased in density since 2020, likely treated osseous metastasis. 5. Left lower lobe nodules measuring up to 5 mm are unchanged. 6. Aortic Atherosclerosis (ICD10-I70.0).         REVIEW OF SYSTEMS:   Constitutional: Denies fevers, chills or abnormal weight loss Eyes: Denies blurriness of vision Ears, nose, mouth, throat, and face: Denies mucositis or sore throat Respiratory: Denies cough, dyspnea or wheezes Cardiovascular: Denies palpitation, chest discomfort or lower extremity  swelling Gastrointestinal:  Denies nausea, heartburn or change in bowel habits Skin: Denies abnormal skin rashes Lymphatics: Denies new lymphadenopathy or easy bruising Neurological:Denies numbness, tingling or new weaknesses Behavioral/Psych: Mood is stable, no new changes  All other systems were reviewed with the patient and are negative.   VITALS:   Today's Vitals   08/27/23 0944  BP: 108/78  Resp: 15  Temp: 97.7 F (36.5 C)  TempSrc: Temporal  SpO2: 100%  Weight: 149 lb 3.2 oz (67.7 kg)  Height: 5\' 5"  (1.651 m)   Body mass index is 24.83 kg/m.   Wt Readings from Last 3 Encounters:  08/27/23 149 lb 3.2 oz (67.7 kg)  08/13/23 148 lb 11.2 oz (67.4 kg)  07/31/23 161 lb 6 oz (73.2 kg)    Body mass index is 24.83 kg/m.  Performance status (ECOG): 1 - Symptomatic but completely ambulatory  PHYSICAL EXAM:   GENERAL:alert, no distress and comfortable SKIN: skin color, texture, turgor are normal, no rashes or significant lesions EYES: normal, Conjunctiva are pink and non-injected, sclera clear OROPHARYNX:no exudate, no erythema and lips, buccal mucosa, and tongue normal  NECK: supple, thyroid normal size, non-tender, without nodularity LYMPH:  no palpable lymphadenopathy in the cervical, axillary or inguinal LUNGS: clear to auscultation and percussion with normal breathing effort HEART: No murmurs and no lower extremity edema.  EKG showing A-fib.  Irregular pulse.  ABDOMEN:abdomen soft, non-tender and normal bowel sounds Musculoskeletal:no cyanosis of digits and no clubbing  NEURO: alert & oriented x 3 with fluent speech, no focal motor/sensory deficits  LABORATORY DATA:  I have reviewed the data as listed    Component Value Date/Time   NA 143 08/27/2023 0917   NA 139 12/04/2021 1359   K 4.3 08/27/2023 0917   CL 112 (H) 08/27/2023 0917   CO2 27 08/27/2023 0917   GLUCOSE 73 08/27/2023 0917   BUN 19 08/27/2023 0917   BUN 20 12/04/2021 1359   CREATININE 1.02  08/27/2023 0917   CALCIUM 9.4 08/27/2023 0917   PROT 6.2 (L) 08/27/2023 0917   PROT 6.8 12/04/2021 1359   ALBUMIN 3.8 08/27/2023 0917   ALBUMIN 4.6 12/04/2021 1359   AST 24 08/27/2023 0917   ALT 6 08/27/2023 0917   ALKPHOS 59 08/27/2023 0917   BILITOT 0.5 08/27/2023 0917   GFRNONAA >60 08/27/2023 0917   GFRAA 47 (L) 05/06/2020 0935    Lab Results  Component Value Date   WBC 2.9 (L) 08/27/2023   NEUTROABS 1.0 (L) 08/27/2023   HGB 10.5 (L) 08/27/2023   HCT 32.5 (L) 08/27/2023   MCV 95.0 08/27/2023   PLT 243 08/27/2023

## 2023-08-27 NOTE — Telephone Encounter (Signed)
STAT if HR is under 50 or over 120 (normal HR is 60-100 beats per minute)  What is your heart rate?  Fluctuating between 29 - 121  Do you have a log of your heart rate readings (document readings)?   Do you have any other symptoms?  No  Caller [name] [name]) stated patient's heart rate reading has been fluctuating and wants advice on next steps.  BP reading today 108/78.

## 2023-08-28 LAB — CANCER ANTIGEN 19-9: CA 19-9: 700 U/mL — ABNORMAL HIGH (ref 0–35)

## 2023-09-02 NOTE — Telephone Encounter (Signed)
Pt c/o BP issue: STAT if pt c/o blurred vision, one-sided weakness or slurred speech  1. What are your last 5 BP readings?   113/89  HR 111 113/84  HR 73 113/96  HR 96 98/76  HR 120 96/67  HR 97  2. Are you having any other symptoms (ex. Dizziness, headache, blurred vision, passed out)?   A little blurred vision  3. What is your BP issue?    Daughter stated patient BP has been trending low and wants a call back to discuss next steps.

## 2023-09-02 NOTE — Telephone Encounter (Signed)
Spoke with patient and he states he has been experiencing elevated HR.  He has not taken his diltiazem because his BP has been running low. He denied any chest pain or SOB.   He took his BP while on the phone and  it was 107/86 hr is 115.   Informed patient that instructions on diltiazem is to not take if SBP is less than 100 and hr less than 55.   Patient will take his diltiazem and continue to monitor HR. ED precautions discussed. Will forward to provider.

## 2023-09-03 ENCOUNTER — Other Ambulatory Visit: Payer: Self-pay

## 2023-09-03 DIAGNOSIS — I4891 Unspecified atrial fibrillation: Secondary | ICD-10-CM

## 2023-09-03 MED ORDER — DILTIAZEM HCL 90 MG PO TABS: 90.0000 mg | ORAL_TABLET | Freq: Two times a day (BID) | ORAL | 3 refills | Status: DC

## 2023-09-03 NOTE — Progress Notes (Signed)
Spoke with pt over the phone to check how his HR has been since yesterday (09/02/23) when he called in. He stated there hasn't been much difference but his BP readings have been stable in the 110s-120s SBP. Went to DOD today Elberta Fortis 09/03/23) and he recommended we increase his Diltiazem to 90 mg BID. New order has been sent in to the pharmacy and pt is aware. Explained to pt that if he starts having higher HR readings than what he has been having and they are sustaining or if he develops new onset SOB, CP, he needs to go to the ED over the weekend to get evaluated. Pt verbalized understanding and had no further questions.

## 2023-09-05 ENCOUNTER — Encounter: Payer: Self-pay | Admitting: Internal Medicine

## 2023-09-05 ENCOUNTER — Encounter: Payer: Self-pay | Admitting: Nurse Practitioner

## 2023-09-05 ENCOUNTER — Encounter: Payer: Self-pay | Admitting: Hematology

## 2023-09-06 DIAGNOSIS — L570 Actinic keratosis: Secondary | ICD-10-CM | POA: Diagnosis not present

## 2023-09-06 DIAGNOSIS — L853 Xerosis cutis: Secondary | ICD-10-CM | POA: Diagnosis not present

## 2023-09-06 DIAGNOSIS — L814 Other melanin hyperpigmentation: Secondary | ICD-10-CM | POA: Diagnosis not present

## 2023-09-06 DIAGNOSIS — L821 Other seborrheic keratosis: Secondary | ICD-10-CM | POA: Diagnosis not present

## 2023-09-06 DIAGNOSIS — L905 Scar conditions and fibrosis of skin: Secondary | ICD-10-CM | POA: Diagnosis not present

## 2023-09-06 NOTE — Telephone Encounter (Signed)
Called pt to check on how he was feeling and pt stated he was "feeling great". He was currently eating dinner and not home where he could tell me his HR readings after making the most recent medication adjustments. I told pt I would call back in the morning and he stated that would be good.

## 2023-09-06 NOTE — Telephone Encounter (Signed)
[name] [name],   Can you talk and find out how he is doing?  Terrez Ander [place], DO, [facility]

## 2023-09-09 ENCOUNTER — Other Ambulatory Visit (HOSPITAL_BASED_OUTPATIENT_CLINIC_OR_DEPARTMENT_OTHER): Payer: Self-pay

## 2023-09-09 ENCOUNTER — Telehealth: Payer: Self-pay

## 2023-09-09 DIAGNOSIS — I4891 Unspecified atrial fibrillation: Secondary | ICD-10-CM

## 2023-09-09 DIAGNOSIS — I480 Paroxysmal atrial fibrillation: Secondary | ICD-10-CM

## 2023-09-09 MED ORDER — DILTIAZEM HCL 30 MG PO TABS
30.0000 mg | ORAL_TABLET | Freq: Two times a day (BID) | ORAL | 3 refills | Status: DC
  Filled 2023-09-09: qty 90, 45d supply, fill #0

## 2023-09-09 NOTE — Telephone Encounter (Signed)
 Pt daughter calling back to get an update on this issue. She is frustrated that this issue is not being resolved and requested a c/b today.

## 2023-09-09 NOTE — Telephone Encounter (Signed)
 Spoke with pt and had him go over recent BP and HR readings. Pt stated on 12/29 at 8 PM BP was 134/84 with HR 107, then BP 118/79 HR 105. On 12/30 at 9:30 AM BP was 123/95 HR 105, then at 10:30 AM BP was 77/65 HR 91. 12/31 at 11 AM, BP was 69/56. Pt stated he felt weird and not good. Today (1/2) pt stated that his BP at 7:30 AM was 124/91 HR 85, then at 9 AM BP was 108/79 HR 82. On 12/27, DOD (Dr. Inocencio) had suggested we switch him to 90 mg BID as long as SBP greater than 100. However, after speaking with the pt this AM, he stated he has not started the 90 mg and has continued to stay on the 30 mg and depending on his BP and HR will take 1.5 tablets (45 mg) but never takes a full 2 tablets in one day (60 mg total). Pt was asked if she takes his first dose in the AM and second dose in the evening and he stated it depends on his readings. From pt explanation, it was unclear on his frequency of taking the diltiazem  but sounded like he takes 45 mg if needed without a set time. This information will be forwarded to Dr. Michele. Pt's daughter Stoney) told we would call her back with recommendations.

## 2023-09-09 NOTE — Telephone Encounter (Signed)
 Spoke with

## 2023-09-09 NOTE — Telephone Encounter (Signed)
 Spoke with pt's daughter and explained that Dr. Michele recommended switching back to Diltiazem  30 mg BID with the option of taking it every 6 hours if needed. Pt told to take the 30 mg BID instead of the 45 mg daily he has been taking. Pt told to hold medication if SBP less than 100 and/ or if HR is less than 55. Pt's daughter advised to take pt to ED if BP drops low again like in previous days to get evaluated. Pt's daughter verbalized understanding. Order for Diltiazem  30 mg BID updated and an order for referral to afib clinic placed.

## 2023-09-10 ENCOUNTER — Inpatient Hospital Stay: Payer: Medicare Other

## 2023-09-10 ENCOUNTER — Inpatient Hospital Stay (HOSPITAL_BASED_OUTPATIENT_CLINIC_OR_DEPARTMENT_OTHER): Payer: Medicare Other | Admitting: Hematology

## 2023-09-10 ENCOUNTER — Inpatient Hospital Stay: Payer: Medicare Other | Attending: Internal Medicine

## 2023-09-10 VITALS — BP 92/69 | HR 86 | Resp 18 | Ht 65.0 in | Wt 146.1 lb

## 2023-09-10 DIAGNOSIS — I700 Atherosclerosis of aorta: Secondary | ICD-10-CM | POA: Insufficient documentation

## 2023-09-10 DIAGNOSIS — G20A1 Parkinson's disease without dyskinesia, without mention of fluctuations: Secondary | ICD-10-CM | POA: Diagnosis not present

## 2023-09-10 DIAGNOSIS — R918 Other nonspecific abnormal finding of lung field: Secondary | ICD-10-CM | POA: Insufficient documentation

## 2023-09-10 DIAGNOSIS — R0609 Other forms of dyspnea: Secondary | ICD-10-CM | POA: Insufficient documentation

## 2023-09-10 DIAGNOSIS — R530 Neoplastic (malignant) related fatigue: Secondary | ICD-10-CM | POA: Insufficient documentation

## 2023-09-10 DIAGNOSIS — C7951 Secondary malignant neoplasm of bone: Secondary | ICD-10-CM | POA: Insufficient documentation

## 2023-09-10 DIAGNOSIS — Z5111 Encounter for antineoplastic chemotherapy: Secondary | ICD-10-CM | POA: Insufficient documentation

## 2023-09-10 DIAGNOSIS — Z887 Allergy status to serum and vaccine status: Secondary | ICD-10-CM | POA: Insufficient documentation

## 2023-09-10 DIAGNOSIS — C61 Malignant neoplasm of prostate: Secondary | ICD-10-CM | POA: Diagnosis not present

## 2023-09-10 DIAGNOSIS — R5383 Other fatigue: Secondary | ICD-10-CM | POA: Diagnosis not present

## 2023-09-10 DIAGNOSIS — M545 Low back pain, unspecified: Secondary | ICD-10-CM | POA: Insufficient documentation

## 2023-09-10 DIAGNOSIS — F17290 Nicotine dependence, other tobacco product, uncomplicated: Secondary | ICD-10-CM | POA: Diagnosis not present

## 2023-09-10 DIAGNOSIS — Z905 Acquired absence of kidney: Secondary | ICD-10-CM | POA: Insufficient documentation

## 2023-09-10 DIAGNOSIS — N183 Chronic kidney disease, stage 3 unspecified: Secondary | ICD-10-CM | POA: Insufficient documentation

## 2023-09-10 DIAGNOSIS — C250 Malignant neoplasm of head of pancreas: Secondary | ICD-10-CM

## 2023-09-10 DIAGNOSIS — K5900 Constipation, unspecified: Secondary | ICD-10-CM | POA: Insufficient documentation

## 2023-09-10 DIAGNOSIS — G893 Neoplasm related pain (acute) (chronic): Secondary | ICD-10-CM | POA: Diagnosis not present

## 2023-09-10 DIAGNOSIS — Z79633 Long term (current) use of mitotic inhibitor: Secondary | ICD-10-CM | POA: Diagnosis not present

## 2023-09-10 DIAGNOSIS — Z7901 Long term (current) use of anticoagulants: Secondary | ICD-10-CM | POA: Insufficient documentation

## 2023-09-10 DIAGNOSIS — Z79631 Long term (current) use of antimetabolite agent: Secondary | ICD-10-CM | POA: Diagnosis not present

## 2023-09-10 DIAGNOSIS — Z79899 Other long term (current) drug therapy: Secondary | ICD-10-CM | POA: Diagnosis not present

## 2023-09-10 DIAGNOSIS — I4891 Unspecified atrial fibrillation: Secondary | ICD-10-CM | POA: Diagnosis not present

## 2023-09-10 DIAGNOSIS — R1310 Dysphagia, unspecified: Secondary | ICD-10-CM | POA: Insufficient documentation

## 2023-09-10 DIAGNOSIS — C7800 Secondary malignant neoplasm of unspecified lung: Secondary | ICD-10-CM | POA: Diagnosis not present

## 2023-09-10 DIAGNOSIS — R0789 Other chest pain: Secondary | ICD-10-CM | POA: Diagnosis not present

## 2023-09-10 DIAGNOSIS — R54 Age-related physical debility: Secondary | ICD-10-CM | POA: Diagnosis not present

## 2023-09-10 DIAGNOSIS — Z95828 Presence of other vascular implants and grafts: Secondary | ICD-10-CM

## 2023-09-10 DIAGNOSIS — Z9049 Acquired absence of other specified parts of digestive tract: Secondary | ICD-10-CM | POA: Insufficient documentation

## 2023-09-10 LAB — CBC WITH DIFFERENTIAL (CANCER CENTER ONLY)
Abs Immature Granulocytes: 0.05 10*3/uL (ref 0.00–0.07)
Basophils Absolute: 0 10*3/uL (ref 0.0–0.1)
Basophils Relative: 1 %
Eosinophils Absolute: 0.2 10*3/uL (ref 0.0–0.5)
Eosinophils Relative: 4 %
HCT: 32.5 % — ABNORMAL LOW (ref 39.0–52.0)
Hemoglobin: 10.5 g/dL — ABNORMAL LOW (ref 13.0–17.0)
Immature Granulocytes: 1 %
Lymphocytes Relative: 32 %
Lymphs Abs: 1.3 10*3/uL (ref 0.7–4.0)
MCH: 31.3 pg (ref 26.0–34.0)
MCHC: 32.3 g/dL (ref 30.0–36.0)
MCV: 97 fL (ref 80.0–100.0)
Monocytes Absolute: 0.8 10*3/uL (ref 0.1–1.0)
Monocytes Relative: 20 %
Neutro Abs: 1.7 10*3/uL (ref 1.7–7.7)
Neutrophils Relative %: 42 %
Platelet Count: 205 10*3/uL (ref 150–400)
RBC: 3.35 MIL/uL — ABNORMAL LOW (ref 4.22–5.81)
RDW: 16.4 % — ABNORMAL HIGH (ref 11.5–15.5)
WBC Count: 4 10*3/uL (ref 4.0–10.5)
nRBC: 0 % (ref 0.0–0.2)

## 2023-09-10 LAB — CMP (CANCER CENTER ONLY)
ALT: 5 U/L (ref 0–44)
AST: 26 U/L (ref 15–41)
Albumin: 3.7 g/dL (ref 3.5–5.0)
Alkaline Phosphatase: 60 U/L (ref 38–126)
Anion gap: 5 (ref 5–15)
BUN: 18 mg/dL (ref 8–23)
CO2: 27 mmol/L (ref 22–32)
Calcium: 9.2 mg/dL (ref 8.9–10.3)
Chloride: 109 mmol/L (ref 98–111)
Creatinine: 1.05 mg/dL (ref 0.61–1.24)
GFR, Estimated: >60 mL/min (ref >60)
Glucose, Bld: 131 mg/dL — ABNORMAL HIGH (ref 70–99)
Potassium: 4 mmol/L (ref 3.5–5.1)
Sodium: 141 mmol/L (ref 135–145)
Total Bilirubin: 0.7 mg/dL (ref 0.0–1.2)
Total Protein: 6.5 g/dL (ref 6.5–8.1)

## 2023-09-10 MED ORDER — SODIUM CHLORIDE 0.9% FLUSH
10.0000 mL | INTRAVENOUS | Status: DC | PRN
  Administered 2023-09-10: 10 mL

## 2023-09-10 MED ORDER — SODIUM CHLORIDE 0.9 % IV SOLN: INTRAVENOUS | Status: DC

## 2023-09-10 MED ORDER — PROCHLORPERAZINE MALEATE 10 MG PO TABS
10.0000 mg | ORAL_TABLET | Freq: Once | ORAL | Status: AC
Start: 2023-09-10 — End: 2023-09-10
  Administered 2023-09-10: 10 mg via ORAL
  Filled 2023-09-10: qty 1

## 2023-09-10 MED ORDER — SODIUM CHLORIDE 0.9% FLUSH
10.0000 mL | Freq: Once | INTRAVENOUS | Status: AC
  Administered 2023-09-10: 10 mL

## 2023-09-10 MED ORDER — HEPARIN SOD (PORK) LOCK FLUSH 100 UNIT/ML IV SOLN
500.0000 [IU] | Freq: Once | INTRAVENOUS | Status: AC | PRN
  Administered 2023-09-10: 500 [IU]

## 2023-09-10 MED ORDER — PACLITAXEL PROTEIN-BOUND CHEMO INJECTION 100 MG
100.0000 mg/m2 | Freq: Once | INTRAVENOUS | Status: AC
  Administered 2023-09-10: 175 mg via INTRAVENOUS
  Filled 2023-09-10: qty 35

## 2023-09-10 MED ORDER — SODIUM CHLORIDE 0.9 % IV SOLN
800.0000 mg/m2 | Freq: Once | INTRAVENOUS | Status: AC
  Administered 2023-09-10: 1406 mg via INTRAVENOUS
  Filled 2023-09-10: qty 36.98

## 2023-09-10 NOTE — Telephone Encounter (Signed)
 Please refer to telephone note from 1/2 regarding this updated plan.

## 2023-09-10 NOTE — Progress Notes (Signed)
 [facility]   Telephone:(336) [phone] Fax:(336) [phone]   Clinic Follow up Note   Patient Care Team: Elliot Charm, MD as PCP - General (Internal Medicine) Michele Richardson, DO as PCP - Cardiology (Cardiology) Tat, Asberry RAMAN, DO as Consulting Physician (Neurology) Rennie Cindy SAUNDERS, MD as Consulting Physician (Hematology and Oncology) Dasie Leonor CROME, MD as Consulting Physician (General Surgery) Lanny Callander, MD as Consulting Physician (Hematology and Oncology) Brenna Adine CROME, DO as Consulting Physician (Pulmonary Disease)  Date of Service:  09/10/2023  CHIEF COMPLAINT: f/u of pancreatic cancer  CURRENT THERAPY:  Gemcitabine  and Abraxane  every 2 weeks  Oncology History   Pancreatic cancer [facility]) diagnosed in 09/2021, deemed poor surgical candidate due to his age and medical co-morbidities -S/p single agent gemcitabine  01/13/22 - 04/01/22 and 5 fx SBRT [facility]) 05/04/22 - 05/14/22. CA 19-9 decreased after treatment -Surveillance CT 11/09/2022 showed slightly decreased size of pancreatic head mass, no evidence of metastatic disease.  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  He is taking ibuprofen as needed.  No other clinical concern for cancer progression. -Lab reviewed, overall stable, tumor marker still pending. -He was admitted for right femoral neck fracture  on 02/03/23 after a fall and had surgery  -CT scan from May 17, 2023 showed multiple lung nodules, with dominant 1.5 cm nodule in the right middle lobe, highly concerning for metastasis.  His lung lesions are positive on PET scan. -Bronchoscopy and biopsy attempted, which showed atypical cells. -He restarted chemo gemcitabine  and abraxane  on 07/29/2023   Assessment and Plan    Pancreatic Cancer Undergoing fourth chemotherapy cycle. Reports fatigue lasting up to two weeks post-treatment, with the most severe fatigue lasting about four  days. No fever, chills, or significant pain. Tumor markers show a slight decrease, indicating a potential positive response. Managing constipation with occasional suppositories. Plan to repeat scan after next treatment or cycle six to assess response. Monitoring tumor marker trends for therapy effectiveness. - Proceed with chemotherapy today - Order scan in four weeks to assess treatment response - Monitor tumor markers for changes  Atrial Fibrillation Irregular heartbeat managed by cardiologist Dr. Michele. No acute symptoms or complications reported. - Continue management with cardiologist  General Health Maintenance No new health maintenance issues. No medication refills requested or additional questions. 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 Sclerotic 1.5 cm left ischial lesion/ Sclerotic medial left iliac bone 1.5 cm lesion (series 2/image 47), increased from 1.0 cm. Gleason score of 4+5= 9; with almost all cores involved greater than 80%.  9/16 Lupron  54-month depot on 9/16. [Urology; Dr.Siniski]  # MID OCT 2020- Zytiga  1000 mg+ prednisone ; stopped December 2021 [poor tolerance if with RVR]; DISCONTINUED.   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       Prostate cancer metastatic to bone [facility])  05/24/2019 Initial Diagnosis   Prostate cancer metastatic to bone [facility])   Pancreatic cancer (HCC)  10/02/2021 Imaging  CT ABDOMEN PELVIS W CONTRAST   IMPRESSION: 1. There is new pancreatic ductal  dilation with an indeterminate 19 mm hypodense low-density mass area in the head/uncinate process of the pancreas. Recommend further evaluation with dedicated MRI/MRCP with contrast. 2. No evidence of bowel obstruction.  Moderate rectal stool ball. 3. Scattered peripherally located clustered pulmonary nodules in the RIGHT middle lobe. Findings are likely infectious or inflammatory in etiology. Given history of malignancy, recommend follow-up as per clinical protocol.   10/28/2021 Imaging   MR ABDOMEN MRCP W WO CONTAST   IMPRESSION: 1. Examination is significantly limited by breath motion artifact throughout. 2. The main pancreatic duct is diffusely dilated from the level of the superior pancreatic head, measuring up to 0.7 cm. 3. In the inferior pancreatic head and uncinate, there is a multilobulated, fluid signal cystic lesion measuring 1.8 x 1.0 cm. Due to breath motion artifact it is difficult to determine whether this communicates to the adjacent duct. There are multiple additional subcentimeter cystic lesions scattered throughout the pancreas, several of which clearly communicate to the main pancreatic duct. 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 Recommend EUS/FNA for further diagnosis given the presence of pancreatic ductal dilatation. 4. Status post left nephrectomy. 5. No evidence of recurrent or metastatic disease in the abdomen.   12/25/2021 Procedure   UPPER ENDOSCOPIC ULTRASOUND-By Dr. Wilhelmenia  - A mass-like region was identified in the pancreatic head where the pancreatic duct dilates with multiple cystic regions noted throughout the pancreas as well. 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 However, the endosonographic appearance is suspicious for potential adenocarcinoma. This was staged T2 N0 Mx by endosonographic criteria. T - No malignant-appearing lymph nodes were visualized in the celiac  region (level 20), peripancreatic region and porta hepatis region.   12/25/2021 Pathology Results   CYTOLOGY - NON PAP  CASE: [id number]   FINAL MICROSCOPIC DIAGNOSIS:  A. PANCREAS, HEAD, FINE NEEDLE ASPIRATION:  - Malignant cells consistent with adenocarcinoma     01/01/2022 Initial Diagnosis   Pancreatic cancer (HCC)   01/13/2022 - 04/01/2022 Chemotherapy   Patient is on Treatment Plan : PANCREAS Gemcitabine  D1,15 q28d x 4 Cycles     01/26/2022 Cancer Staging   Staging form: Exocrine Pancreas, AJCC 8th Edition - Clinical: Stage IB (cT2, cN0, cM0) - Signed by Lanny Callander, MD on 01/26/2022 Total positive nodes: 0    Genetic Testing   [facility] CancerNext-Expanded Panel was Negative. Of note, a variant of uncertain significance was identified in the BLM gene (p.N936D) and GALNT12 gene ([id number]). Report date is 01/26/2022.  The CancerNext-Expanded gene panel offered by [facility] and includes sequencing, rearrangement, and RNA analysis for the following 77 genes: AIP, ALK, APC, ATM, AXIN2, BAP1, BARD1, BLM, BMPR1A, BRCA1, BRCA2, BRIP1, CDC73, CDH1, CDK4, CDKN1B, CDKN2A, CHEK2, CTNNA1, DICER1, FANCC, FH, FLCN, GALNT12, KIF1B, LZTR1, MAX, MEN1, MET, MLH1, MSH2, MSH3, MSH6, MUTYH, NBN, NF1, NF2, NTHL1, PALB2, PHOX2B, PMS2, POT1, PRKAR1A, PTCH1, PTEN, RAD51C, RAD51D, RB1, RECQL, RET, SDHA, SDHAF2, SDHB, SDHC, SDHD, SMAD4, SMARCA4, SMARCB1, SMARCE1, STK11, SUFU, TMEM127, TP53, TSC1, TSC2, VHL and XRCC2 (sequencing and deletion/duplication); EGFR, EGLN1, HOXB13, KIT, MITF, PDGFRA, POLD1, and POLE (sequencing only); EPCAM and GREM1 (deletion/duplication only).    08/12/2022 Imaging    IMPRESSION: 1. Slight interval increase in size of the pancreatic head-uncinate process mass with increasing direct contact of the SMA and portal vein as discussed. Also with a replaced RIGHT hepatic artery which passes through the tumor.  2. No signs of acute inflammation currently about the pancreas.  With similar appearance of ductal obstruction and peripheral atrophy of pancreas due to the tumor. 3. Post LEFT nephrectomy. 4. Sclerotic bony lesions of the LEFT ischium and LEFT iliac bone similar to prior imaging. 5. Mild hyperenhancement of LEFT prostate 14 mm with asymmetry, nonspecific. This is unchanged in this patient with history of prostate neoplasm. 6. Aortic atherosclerosis.   07/29/2023 -  Chemotherapy   Patient is on Treatment Plan : PANCREATIC Abraxane  D1,8,15 + Gemcitabine  D1,8,15 q28d     08/03/2023 Imaging   CT chest abdomen pelvis with contrast IMPRESSION: 1. No substantial change in size of heterogeneously enhancing pancreatic head mass with adjacent fiducials. 2. Slightly increased main pancreatic ductal dilation and mild intrahepatic bile duct dilation. 3. No evidence of new or progressive metastatic disease in the abdomen or pelvis. 4. Similar mildly sclerotic focus in the left ischial tuberosity dating back to 01/07/2019. A left iliac bone faintly sclerotic lesion measuring 1.4 cm has decreased in density since 2020, likely treated osseous metastasis. 5. Left lower lobe nodules measuring up to 5 mm are unchanged. 6. Aortic Atherosclerosis (ICD10-I70.0).        Discussed the use of AI scribe software for clinical note transcription with the patient, who gave verbal consent to proceed.  History of Present Illness   The patient, an 83 year old with pancreatic cancer, presents for his fourth chemotherapy treatment. He reports fatigue lasting up to two weeks following each treatment, with particularly severe fatigue for about four days. Despite this, he is able to function and engage in daily activities. He denies fever and chills. He also reports an irregular heartbeat, which is currently being managed by his cardiologist. He occasionally experiences stomach discomfort and constipation, which he manages with suppositories as needed. He denies any other pain or  discomfort.         All other systems were reviewed with the patient and are negative.  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 ENDOSCOPY;  Service: Gastroenterology;;   BRONCHIAL BIOPSY  07/05/2023   Procedure: BRONCHIAL BIOPSIES;  Surgeon: Brenna Adine CROME, DO;  Location: [facility] ENDOSCOPY;  Service: Pulmonary;;   COLONOSCOPY  2010, 2015   ESOPHAGOGASTRODUODENOSCOPY N/A 04/16/2022   Procedure: ESOPHAGOGASTRODUODENOSCOPY (EGD);  Surgeon: Wilhelmenia Aloha Raddle., MD;  Location: [facility] ENDOSCOPY;  Service: Gastroenterology;  Laterality: N/A;   ESOPHAGOGASTRODUODENOSCOPY (EGD) WITH PROPOFOL  N/A 12/25/2021   Procedure: ESOPHAGOGASTRODUODENOSCOPY (EGD) WITH PROPOFOL ;  Surgeon: Wilhelmenia Aloha Raddle., MD;  Location: [facility] ENDOSCOPY;  Service: Gastroenterology;  Laterality: N/A;   EUS N/A 12/25/2021   Procedure: UPPER ENDOSCOPIC ULTRASOUND (EUS) RADIAL;  Surgeon: Wilhelmenia Aloha Raddle., MD;  Location: [facility] ENDOSCOPY;  Service: Gastroenterology;  Laterality: N/A;   EUS N/A 04/16/2022   Procedure: UPPER ENDOSCOPIC ULTRASOUND (EUS) RADIAL;  Surgeon: Wilhelmenia Aloha Raddle., MD;  Location[facility] ENDOSCOPY;  Service: Gastroenterology;  Laterality: N/A;   FIDUCIAL  MARKER PLACEMENT N/A 04/16/2022   Procedure: FIDUCIAL MARKER PLACEMENT;  Surgeon: Wilhelmenia Aloha Raddle., MD;  Location: [facility] ENDOSCOPY;  Service: Gastroenterology;  Laterality: N/A;   FINE NEEDLE ASPIRATION  12/25/2021   Procedure: FINE NEEDLE ASPIRATION (FNA) LINEAR;  Surgeon: Wilhelmenia Aloha Raddle., MD;  Location: [facility] ENDOSCOPY;  Service: Gastroenterology;;   IR IMAGING GUIDED PORT INSERTION  07/28/2023   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   KYPHOPLASTY N/A 08/15/2020   Procedure: L2 compression fracture;  Surgeon: Kathlynn Sharper, MD;  Location: [facility] ORS;  Service: Orthopedics;  Laterality: N/A;   KYPHOPLASTY N/A 08/29/2020   Procedure: T8 KYPHOPLASTY;  Surgeon: Kathlynn Sharper, MD;  Location: [facility] ORS;  Service: Orthopedics;  Laterality: N/A;   KYPHOPLASTY N/A 11/12/2020   Procedure: L1 KYPHOPLASTY;  Surgeon: Kathlynn Sharper, MD;  Location: [facility] ORS;  Service: Orthopedics;  Laterality: N/A;   PORTACATH PLACEMENT N/A 02/05/2022   Procedure: INSERTION PORT-A-CATH;  Surgeon: Dasie Leonor CROME, MD;  Location: [facility] ORS;  Service: General;  Laterality: N/A;    I have reviewed the social history and family history with the patient and they are unchanged from previous note.  ALLERGIES:  is allergic to influenza vaccines and influenza virus vaccine.  MEDICATIONS:  Current Outpatient Medications  Medication Sig Dispense Refill   acetaminophen  (TYLENOL ) 500 MG tablet Take 1 tablet (500 mg total) by mouth every 6 (six) hours as needed for moderate pain or mild pain. 30 tablet 0   ammonium lactate (LAC-HYDRIN) 12 % lotion Apply 1 Application topically as needed.     bisacodyl  (DULCOLAX) 10 MG suppository Place 1 suppository (10 mg total) rectally daily as needed for moderate constipation. 12 suppository 0   carbidopa -levodopa  (SINEMET  CR) 50-200 MG tablet Take 1 tablet by mouth at bedtime. 90 tablet 0   carbidopa -levodopa  (SINEMET  IR) 25-100 MG tablet Take 3 tablets at 7 AM, 2 tablets at 11 AM, 3 tablets at 3 PM, 2  tablets at 7 PM 900 tablet 0   ciclopirox (PENLAC) 8 % solution Apply 1 Application topically at bedtime.     clonazePAM  (KLONOPIN ) 0.5 MG tablet Take 1 tablet (0.5 mg total) by mouth at bedtime. 5 tablet 0   cyanocobalamin  (VITAMIN B12) 1000 MCG tablet Take 1,000 mcg by mouth daily.     diltiazem  (CARDIZEM ) 30 MG tablet Take 1 tablet (30 mg total) by mouth 2 (two) times daily. Do not take if systolic blood pressure (top number) is less than 100, or if pulse if less than 55. 90 tablet 3   ELIQUIS  5 MG TABS tablet TAKE 1 TABLET TWICE A DAY 120 tablet 5   Glycerin -Hypromellose-PEG 400 (VISINE DRY EYE OP) Place 1 drop into both eyes 2 (two) times daily as needed (eye irritation).      ketoconazole (NIZORAL) 2 % shampoo Apply 1 application  topically 3 (three) times a week.     leuprolide , 6 Month, (ELIGARD ) 45 MG injection Inject 45 mg into the skin every 6 (six) months.     lidocaine -prilocaine  (EMLA ) cream Apply to port site 1 to 2 hours prior to appointments/treatment 30 g 3   omeprazole  (PRILOSEC) 20 MG capsule Take 1 capsule (20 mg total) by mouth daily. 90 capsule 3   ondansetron  (ZOFRAN ) 4 MG tablet Take 1  tablet (4 mg total) by mouth every 6 (six) hours as needed for nausea. 20 tablet 0   oxyCODONE  (OXY IR/ROXICODONE ) 5 MG immediate release tablet Take 1 tablet (5 mg total) by mouth every 6 (six) hours as needed for severe pain (pain score 7-10). 60 tablet 0   PANCREAZE  37000-97300 units CPEP TAKE 2 CAPSULES WITH BREAKFAST, LUNCH, AND EVENING MEAL (WITH EACH MEAL) AND 1 CAPSULE WITH SNACK 600 capsule 5   polyethylene glycol powder (MIRALAX ) 17 GM/SCOOP powder Take 17 g by mouth in the morning, at noon, and at bedtime.     prochlorperazine  (COMPAZINE ) 10 MG tablet Take 1 tablet (10 mg total) by mouth every 6 (six) hours as needed for nausea or vomiting. 30 tablet 2   rosuvastatin  (CRESTOR ) 10 MG tablet TAKE 1 TABLET DAILY 90 tablet 1   tiZANidine  (ZANAFLEX ) 2 MG tablet Take 1 tablet (2 mg total)  by mouth every 8 (eight) hours as needed for muscle spasms. 10 tablet 0   triamcinolone  cream (KENALOG ) 0.1 % Apply 1 application topically 2 (two) times daily. (Patient taking differently: Apply 1 application  topically 2 (two) times daily as needed (itching).) 453.6 g 1   zoledronic  acid (RECLAST ) 5 MG/100ML SOLN injection Inject 5 mg into the vein See admin instructions. Once a year     No current facility-administered medications for this visit.    PHYSICAL EXAMINATION: ECOG PERFORMANCE STATUS: 2 - Symptomatic, <50% confined to bed  There were no vitals filed for this visit. Wt Readings from Last 3 Encounters:  09/10/23 146 lb 1.3 oz (66.3 kg)  08/27/23 149 lb 3.2 oz (67.7 kg)  08/13/23 148 lb 11.2 oz (67.4 kg)     GENERAL:alert, no distress and comfortable SKIN: skin color, texture, turgor are normal, no rashes or significant lesions EYES: normal, Conjunctiva are pink and non-injected, sclera clear NECK: supple, thyroid normal size, non-tender, without nodularity LYMPH:  no palpable lymphadenopathy in the cervical, axillary  LUNGS: clear to auscultation and percussion with normal breathing effort HEART: regular rate & rhythm and no murmurs and no lower extremity edema ABDOMEN:abdomen soft, non-tender and normal bowel sounds Musculoskeletal:no cyanosis of digits and no clubbing  NEURO: alert & oriented x 3 with fluent speech, no focal motor/sensory deficits   LABORATORY DATA:  I have reviewed the data as listed    Latest Ref Rng & Units 09/10/2023    7:49 AM 08/27/2023    9:17 AM 08/13/2023    9:08 AM  CBC  WBC 4.0 - 10.5 K/uL 4.0  2.9  3.8   Hemoglobin 13.0 - 17.0 g/dL 89.4  89.4  89.3   Hematocrit 39.0 - 52.0 % 32.5  32.5  33.5   Platelets 150 - 400 K/uL 205  243  238         Latest Ref Rng & Units 09/10/2023    7:49 AM 08/27/2023    9:17 AM 08/13/2023    9:08 AM  CMP  Glucose 70 - 99 mg/dL 868  73  827   BUN 8 - 23 mg/dL 18  19  17    Creatinine 0.61 - 1.24 mg/dL  8.94  8.97  9.08   Sodium 135 - 145 mmol/L 141  143  141   Potassium 3.5 - 5.1 mmol/L 4.0  4.3  4.2   Chloride 98 - 111 mmol/L 109  112  111   CO2 22 - 32 mmol/L 27  27  27    Calcium  8.9 - 10.3 mg/dL 9.2  9.4  9.0   Total Protein 6.5 - 8.1 g/dL 6.5  6.2  5.8   Total Bilirubin 0.0 - 1.2 mg/dL 0.7  0.5  0.7   Alkaline Phos 38 - 126 U/L 60  59  51   AST 15 - 41 U/L 26  24  27    ALT 0 - 44 U/L 5  6  6        RADIOGRAPHIC STUDIES: I have personally reviewed the radiological images as listed and agreed with the findings in the report. No results found.    Orders Placed This Encounter  Procedures   CT CHEST ABDOMEN PELVIS W CONTRAST    Standing Status:   Future    Expected Date:   10/01/2023    Expiration Date:   09/09/2024    If indicated for the ordered procedure, I authorize the administration of contrast media per Radiology protocol:   Yes    Does the patient have a contrast media/X-ray dye allergy?:   No    Preferred imaging location?:   [facility]    If indicated for the ordered procedure, I authorize the administration of oral contrast media per Radiology protocol:   Yes   All questions were answered. The patient knows to call the clinic with any problems, questions or concerns. No barriers to learning was detected. The total time spent in the appointment was 25 minutes.     Onita Mattock, MD 09/10/2023

## 2023-09-10 NOTE — Assessment & Plan Note (Signed)
 diagnosed in 09/2021, deemed poor surgical candidate due to his age and medical co-morbidities -S/p single agent gemcitabine 01/13/22 - 04/01/22 and 5 fx SBRT [name]) 05/04/22 - 05/14/22. CA 19-9 decreased after treatment -Surveillance CT 11/09/2022 showed slightly decreased size of pancreatic head mass, no evidence of metastatic disease.  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  He is taking ibuprofen as needed.  No other clinical concern for cancer progression. -Lab reviewed, overall stable, tumor marker still pending. -He was admitted for right femoral neck fracture  on 02/03/23 after a fall and had surgery  -CT scan from May 17, 2023 showed multiple lung nodules, with dominant 1.5 cm nodule in the right middle lobe, highly concerning for metastasis.  His lung lesions are positive on PET scan. -Bronchoscopy and biopsy attempted, which showed atypical cells. -He restarted chemo gemcitabine and abraxane on 07/29/2023

## 2023-09-10 NOTE — Telephone Encounter (Signed)
 Please update our plan as per discussion yesterday 09/09/23.   [name] Goings [place], DO, [facility]

## 2023-09-10 NOTE — Patient Instructions (Signed)
 [facility] - A DEPT OF [facility]  Discharge Instructions: Thank you for choosing [facility] to provide your oncology and hematology care.   If you have a lab appointment with the Cancer Center, please go directly to the Cancer Center and check in at the registration area.   Wear comfortable clothing and clothing appropriate for easy access to any Portacath or PICC line.   We strive to give you quality time with your provider. You may need to reschedule your appointment if you arrive late (15 or more minutes).  Arriving late affects you and other patients whose appointments are after yours.  Also, if you miss three or more appointments without notifying the office, you may be dismissed from the clinic at the provider's discretion.      For prescription refill requests, have your pharmacy contact our office and allow 72 hours for refills to be completed.    Today you received the following chemotherapy and/or immunotherapy agents Abraxane /Gemzar       To help prevent nausea and vomiting after your treatment, we encourage you to take your nausea medication as directed.  BELOW ARE SYMPTOMS THAT SHOULD BE REPORTED IMMEDIATELY: *FEVER GREATER THAN 100.4 F (38 C) OR HIGHER *CHILLS OR SWEATING *NAUSEA AND VOMITING THAT IS NOT CONTROLLED WITH YOUR NAUSEA MEDICATION *UNUSUAL SHORTNESS OF BREATH *UNUSUAL BRUISING OR BLEEDING *URINARY PROBLEMS (pain or burning when urinating, or frequent urination) *BOWEL PROBLEMS (unusual diarrhea, constipation, pain near the anus) TENDERNESS IN MOUTH AND THROAT WITH OR WITHOUT PRESENCE OF ULCERS (sore throat, sores in mouth, or a toothache) UNUSUAL RASH, SWELLING OR PAIN  UNUSUAL VAGINAL DISCHARGE OR ITCHING   Items with * indicate a potential emergency and should be followed up as soon as possible or go to the Emergency Department if any problems should occur.  Please show the CHEMOTHERAPY ALERT CARD or  IMMUNOTHERAPY ALERT CARD at check-in to the Emergency Department and triage nurse.  Should you have questions after your visit or need to cancel or reschedule your appointment, please contact [facility] - A DEPT OF [facility]  Dept: [phone]  and follow the prompts.  Office hours are 8:00 a.m. to 4:30 p.m. Monday - Friday. Please note that voicemails left after 4:00 p.m. may not be returned until the following business day.  We are closed weekends and major holidays. You have access to a nurse at all times for urgent questions. Please call the main number to the clinic Dept: [phone] and follow the prompts.   For any non-urgent questions, you may also contact your provider using MyChart. We now offer e-Visits for anyone 41 and older to request care online for non-urgent symptoms. For details visit [email or web address].   Also download the MyChart app! Go to the app store, search MyChart, open the app, select [place], and log in with your MyChart username and password.

## 2023-09-15 DIAGNOSIS — M47816 Spondylosis without myelopathy or radiculopathy, lumbar region: Secondary | ICD-10-CM | POA: Diagnosis not present

## 2023-09-16 ENCOUNTER — Other Ambulatory Visit: Payer: Self-pay | Admitting: Nurse Practitioner

## 2023-09-16 ENCOUNTER — Encounter: Payer: Self-pay | Admitting: Hematology

## 2023-09-16 MED ORDER — OXYCODONE HCL 5 MG PO TABS: 5.0000 mg | ORAL_TABLET | Freq: Four times a day (QID) | ORAL | 0 refills | Status: DC | PRN

## 2023-09-20 ENCOUNTER — Encounter: Payer: Self-pay | Admitting: Hematology

## 2023-09-22 DIAGNOSIS — B351 Tinea unguium: Secondary | ICD-10-CM | POA: Diagnosis not present

## 2023-09-22 DIAGNOSIS — L600 Ingrowing nail: Secondary | ICD-10-CM | POA: Diagnosis not present

## 2023-09-23 ENCOUNTER — Inpatient Hospital Stay: Payer: Medicare Other

## 2023-09-23 ENCOUNTER — Encounter: Payer: Self-pay | Admitting: Nurse Practitioner

## 2023-09-23 ENCOUNTER — Telehealth: Payer: Self-pay

## 2023-09-23 ENCOUNTER — Inpatient Hospital Stay: Payer: Medicare Other | Admitting: Nurse Practitioner

## 2023-09-23 VITALS — BP 97/81 | HR 97 | Temp 97.7°F | Resp 16 | Ht 65.0 in | Wt 145.0 lb

## 2023-09-23 DIAGNOSIS — C250 Malignant neoplasm of head of pancreas: Secondary | ICD-10-CM

## 2023-09-23 DIAGNOSIS — C259 Malignant neoplasm of pancreas, unspecified: Secondary | ICD-10-CM

## 2023-09-23 DIAGNOSIS — Z5111 Encounter for antineoplastic chemotherapy: Secondary | ICD-10-CM | POA: Diagnosis not present

## 2023-09-23 DIAGNOSIS — C7800 Secondary malignant neoplasm of unspecified lung: Secondary | ICD-10-CM | POA: Diagnosis not present

## 2023-09-23 DIAGNOSIS — M545 Low back pain, unspecified: Secondary | ICD-10-CM | POA: Diagnosis not present

## 2023-09-23 DIAGNOSIS — C61 Malignant neoplasm of prostate: Secondary | ICD-10-CM | POA: Diagnosis not present

## 2023-09-23 DIAGNOSIS — C7951 Secondary malignant neoplasm of bone: Secondary | ICD-10-CM | POA: Diagnosis not present

## 2023-09-23 LAB — CMP (CANCER CENTER ONLY)
ALT: <5 U/L (ref 0–44)
AST: 24 U/L (ref 15–41)
Albumin: 3.8 g/dL (ref 3.5–5.0)
Alkaline Phosphatase: 61 U/L (ref 38–126)
Anion gap: 5 (ref 5–15)
BUN: 19 mg/dL (ref 8–23)
CO2: 28 mmol/L (ref 22–32)
Calcium: 9.6 mg/dL (ref 8.9–10.3)
Chloride: 108 mmol/L (ref 98–111)
Creatinine: 0.96 mg/dL (ref 0.61–1.24)
GFR, Estimated: >60 mL/min (ref >60)
Glucose, Bld: 118 mg/dL — ABNORMAL HIGH (ref 70–99)
Potassium: 4.3 mmol/L (ref 3.5–5.1)
Sodium: 141 mmol/L (ref 135–145)
Total Bilirubin: 0.7 mg/dL (ref 0.0–1.2)
Total Protein: 6.5 g/dL (ref 6.5–8.1)

## 2023-09-23 LAB — CBC WITH DIFFERENTIAL (CANCER CENTER ONLY)
Abs Immature Granulocytes: 0.09 10*3/uL — ABNORMAL HIGH (ref 0.00–0.07)
Basophils Absolute: 0 10*3/uL (ref 0.0–0.1)
Basophils Relative: 0 %
Eosinophils Absolute: 0.3 10*3/uL (ref 0.0–0.5)
Eosinophils Relative: 5 %
HCT: 32.3 % — ABNORMAL LOW (ref 39.0–52.0)
Hemoglobin: 10.5 g/dL — ABNORMAL LOW (ref 13.0–17.0)
Immature Granulocytes: 1 %
Lymphocytes Relative: 11 %
Lymphs Abs: 0.9 10*3/uL (ref 0.7–4.0)
MCH: 31.5 pg (ref 26.0–34.0)
MCHC: 32.5 g/dL (ref 30.0–36.0)
MCV: 97 fL (ref 80.0–100.0)
Monocytes Absolute: 1.1 10*3/uL — ABNORMAL HIGH (ref 0.1–1.0)
Monocytes Relative: 15 %
Neutro Abs: 5.2 10*3/uL (ref 1.7–7.7)
Neutrophils Relative %: 68 %
Platelet Count: 180 10*3/uL (ref 150–400)
RBC: 3.33 MIL/uL — ABNORMAL LOW (ref 4.22–5.81)
RDW: 17.2 % — ABNORMAL HIGH (ref 11.5–15.5)
WBC Count: 7.6 10*3/uL (ref 4.0–10.5)
nRBC: 0.5 % — ABNORMAL HIGH (ref 0.0–0.2)

## 2023-09-23 MED ORDER — OMEPRAZOLE 40 MG PO CPDR: 40.0000 mg | DELAYED_RELEASE_CAPSULE | Freq: Every day | ORAL | 3 refills | Status: DC

## 2023-09-23 MED ORDER — SODIUM CHLORIDE 0.9% FLUSH
10.0000 mL | INTRAVENOUS | Status: DC | PRN
Start: 2023-09-23 — End: 2023-09-23
  Administered 2023-09-23: 10 mL

## 2023-09-23 MED ORDER — SODIUM CHLORIDE 0.9 % IV SOLN: Freq: Once | INTRAVENOUS | Status: AC

## 2023-09-23 MED ORDER — PACLITAXEL PROTEIN-BOUND CHEMO INJECTION 100 MG
80.0000 mg/m2 | Freq: Once | INTRAVENOUS | Status: AC
  Administered 2023-09-23: 150 mg via INTRAVENOUS
  Filled 2023-09-23: qty 30

## 2023-09-23 MED ORDER — SODIUM CHLORIDE 0.9 % IV SOLN: INTRAVENOUS | Status: DC

## 2023-09-23 MED ORDER — ONDANSETRON HCL 4 MG PO TABS: 4.0000 mg | ORAL_TABLET | Freq: Four times a day (QID) | ORAL | 3 refills | Status: DC | PRN

## 2023-09-23 MED ORDER — HEPARIN SOD (PORK) LOCK FLUSH 100 UNIT/ML IV SOLN
500.0000 [IU] | Freq: Once | INTRAVENOUS | Status: AC | PRN
Start: 2023-09-23 — End: 2023-09-23
  Administered 2023-09-23: 500 [IU]

## 2023-09-23 MED ORDER — PROCHLORPERAZINE MALEATE 10 MG PO TABS: 10.0000 mg | ORAL_TABLET | Freq: Four times a day (QID) | ORAL | 2 refills | Status: DC | PRN

## 2023-09-23 MED ORDER — PROCHLORPERAZINE MALEATE 10 MG PO TABS
10.0000 mg | ORAL_TABLET | Freq: Once | ORAL | Status: AC
  Administered 2023-09-23: 10 mg via ORAL
  Filled 2023-09-23: qty 1

## 2023-09-23 MED ORDER — SODIUM CHLORIDE 0.9 % IV SOLN
800.0000 mg/m2 | Freq: Once | INTRAVENOUS | Status: AC
  Administered 2023-09-23: 1406 mg via INTRAVENOUS
  Filled 2023-09-23: qty 36.98

## 2023-09-23 NOTE — Telephone Encounter (Signed)
-----   Message from [name] [name] sent at 09/23/2023  1:02 PM EST ----- Pt with pancreatic cancer and parkinson's is having some difficulty maintaining hydration, BPs have been low in clinic. I am giving NS with chemo today but he asked about IVF at home. Is he eligible for that? Could you please contact Jeri Modena.   Thanks [employer]

## 2023-09-23 NOTE — Patient Instructions (Addendum)
[facility] - A DEPT OF [facility]  Discharge Instructions: Thank you for choosing [facility] to provide your oncology and hematology care.   If you have a lab appointment with the Cancer Center, please go directly to the Cancer Center and check in at the registration area.   Wear comfortable clothing and clothing appropriate for easy access to any Portacath or PICC line.   We strive to give you quality time with your provider. You may need to reschedule your appointment if you arrive late (15 or more minutes).  Arriving late affects you and other patients whose appointments are after yours.  Also, if you miss three or more appointments without notifying the office, you may be dismissed from the clinic at the provider's discretion.      For prescription refill requests, have your pharmacy contact our office and allow 72 hours for refills to be completed.    Today you received the following chemotherapy and/or immunotherapy agents: Abraxane, Gemzar      To help prevent nausea and vomiting after your treatment, we encourage you to take your nausea medication as directed.  BELOW ARE SYMPTOMS THAT SHOULD BE REPORTED IMMEDIATELY: *FEVER GREATER THAN 100.4 F (38 C) OR HIGHER *CHILLS OR SWEATING *NAUSEA AND VOMITING THAT IS NOT CONTROLLED WITH YOUR NAUSEA MEDICATION *UNUSUAL SHORTNESS OF BREATH *UNUSUAL BRUISING OR BLEEDING *URINARY PROBLEMS (pain or burning when urinating, or frequent urination) *BOWEL PROBLEMS (unusual diarrhea, constipation, pain near the anus) TENDERNESS IN MOUTH AND THROAT WITH OR WITHOUT PRESENCE OF ULCERS (sore throat, sores in mouth, or a toothache) UNUSUAL RASH, SWELLING OR PAIN  UNUSUAL VAGINAL DISCHARGE OR ITCHING   Items with * indicate a potential emergency and should be followed up as soon as possible or go to the Emergency Department if any problems should occur.  Please show the CHEMOTHERAPY ALERT CARD or  IMMUNOTHERAPY ALERT CARD at check-in to the Emergency Department and triage nurse.  Should you have questions after your visit or need to cancel or reschedule your appointment, please contact [facility] - A DEPT OF [facility]  Dept: [phone]  and follow the prompts.  Office hours are 8:00 a.m. to 4:30 p.m. Monday - Friday. Please note that voicemails left after 4:00 p.m. may not be returned until the following business day.  We are closed weekends and major holidays. You have access to a nurse at all times for urgent questions. Please call the main number to the clinic Dept: [phone] and follow the prompts.   For any non-urgent questions, you may also contact your provider using MyChart. We now offer e-Visits for anyone 36 and older to request care online for non-urgent symptoms. For details visit [email or web address].   Also download the MyChart app! Go to the app store, search "MyChart", open the app, select [facility], and log in with your MyChart username and password.  Rehydration, Older Adult  Rehydration is the replacement of fluids, salts, and minerals in the body (electrolytes) that are lost during dehydration. Dehydration is when there is not enough water or other fluids in the body. This happens when you lose more fluids than you take in. People who are age 65 or older have a higher risk of dehydration than younger adults. This is because in older age, the body: Is less able to maintain the right amount of water. Does not respond to temperature changes as well. Does not  get a sense of thirst as easily or quickly. Other causes include: Not drinking enough fluids. This can occur when you are ill, when you forget to drink, or when you are doing activities that require a lot of energy, especially in hot weather. Conditions that cause loss of water or other fluids. These include diarrhea, vomiting, sweating, or urinating a lot. Other illnesses,  such as fever or infection. Certain medicines, such as those that remove excess fluid from the body (diuretics). Symptoms of mild or moderate dehydration may include thirst, dry lips and mouth, and dizziness. Symptoms of severe dehydration may include increased heart rate, confusion, fainting, and not urinating. In severe cases, you may need to get fluids through an IV at the hospital. For mild or moderate cases, you can usually rehydrate at home by drinking certain fluids as told by your health care provider. What are the risks? Rehydration is usually safe. Taking in too much fluid (overhydration) can be a problem but is rare. Overhydration can cause an imbalance of electrolytes in the body, kidney failure, fluid in the lungs, or a decrease in salt (sodium) levels in the body. Supplies needed: You will need an oral rehydration solution (ORS) if your health care provider tells you to use one. This is a drink to treat dehydration. It can be found in pharmacies and retail stores. How to rehydrate Fluids Follow instructions from your health care provider about what to drink. The kind of fluid and the amount you should drink depend on your condition. In general, you should choose drinks that you prefer. If told by your health care provider, drink an ORS. Make an ORS by following instructions on the package. Start by drinking small amounts, about  cup (120 mL) every 5-10 minutes. Slowly increase how much you drink until you have taken in the amount recommended by your health care provider. Drink enough clear fluids to keep your urine pale yellow. If you were told to drink an ORS, finish it first, then start slowly drinking other clear fluids. Drink fluids such as: Water. This includes sparkling and flavored water. Drinking only water can lead to having too little sodium in your body (hyponatremia). Follow the advice of your health care provider. Water from ice chips you suck on. Fruit juice with water  added to it(diluted). Sports drinks. Hot or cold herbal teas. Broth-based soups. Coffee. Milk or milk products. Food Follow instructions from your health care provider about what to eat while you rehydrate. Your health care provider may recommend that you slowly begin eating regular foods in small amounts. Eat foods that contain a healthy balance of electrolytes, such as bananas, oranges, potatoes, tomatoes, and spinach. Avoid foods that are greasy or contain a lot of sugar. In some cases, you may get nutrition through a feeding tube that is passed through your nose and into your stomach (nasogastric tube, or NG tube). This may be done if you have uncontrolled vomiting or diarrhea. Drinks to avoid  Certain drinks may make dehydration worse. While you rehydrate, avoid drinking alcohol. How to tell if you are recovering from dehydration You may be getting better if: You are urinating more often than before you started rehydrating. Your urine is pale yellow. Your energy level improves. You vomit less often. You have diarrhea less often. Your appetite improves or returns to normal. You feel less dizzy or light-headed. Your skin tone and color start to look more normal. Follow these instructions at home: Take over-the-counter and prescription medicines only  as told by your health care provider. Do not take sodium tablets. Doing this can lead to having too much sodium in your body (hypernatremia). Contact a health care provider if: You continue to have symptoms of mild or moderate dehydration, such as: Thirst. Dry lips. Slightly dry mouth. Dizziness. Dark urine or less urine than usual. Muscle cramps. You continue to vomit or have diarrhea. Get help right away if: You have symptoms of dehydration that get worse. You have a fever. You have a severe headache. You have been vomiting and have problems, such as: Your vomiting gets worse. Your vomit includes blood or green matter  (bile). You cannot eat or drink without vomiting. You have problems with urination or bowel movements, such as: Diarrhea that gets worse. Blood in your stool (feces). This may cause stool to look black and tarry. Not urinating, or urinating only a small amount of very dark urine, within 6-8 hours. You have trouble breathing. You have symptoms that get worse with treatment. These symptoms may be an emergency. Get help right away. Call 911. Do not wait to see if the symptoms will go away. Do not drive yourself to the hospital. This information is not intended to replace advice given to you by your health care provider. Make sure you discuss any questions you have with your health care provider. Document Revised: 01/07/2022 Document Reviewed: 01/05/2022 Elsevier Patient Education  2024 [employer].

## 2023-09-23 NOTE — Telephone Encounter (Signed)
spoke with [facility] and they are not able to do any IV fluids at this time  ok thanks, yea with the shortage, not sure we are doing that. we could set it up here once a week in infusion/[facility] if the patient wants. could you please let him know? thanks   Pt advised of recommendations.  He wants to think about it and will let us know via My Chart message if he is up for weekly IV fluids

## 2023-09-23 NOTE — Progress Notes (Signed)
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 Sclerotic 1.5 cm left ischial lesion/ Sclerotic medial left iliac bone 1.5 cm lesion (series 2/image 47), increased from 1.0 cm. Gleason score of 4+5= 9; with almost all cores involved greater than 80%.  9/16 Lupron 1-month depot on 9/16. [Urology; Dr.Siniski]  # MID OCT 2020- Zytiga 1000 mg+ prednisone; stopped December 2021 [poor tolerance if with RVR]; DISCONTINUED.   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       Prostate cancer metastatic to bone (HCC)  05/24/2019 Initial Diagnosis   Prostate cancer metastatic to bone [facility])   Pancreatic cancer (HCC)  10/02/2021 Imaging   CT ABDOMEN PELVIS W CONTRAST   IMPRESSION: 1. There is new pancreatic ductal dilation with an indeterminate 19 mm hypodense low-density mass area in the head/uncinate process of the pancreas. Recommend further evaluation with dedicated MRI/MRCP with contrast. 2. No evidence of bowel obstruction.  Moderate rectal stool ball. 3. Scattered peripherally located clustered pulmonary nodules in the RIGHT middle lobe. Findings are likely infectious or inflammatory in etiology. Given history of malignancy, recommend follow-up as  per clinical protocol.   10/28/2021 Imaging   MR ABDOMEN MRCP W WO CONTAST   IMPRESSION: 1. Examination is significantly limited by breath motion artifact throughout. 2. The main pancreatic duct is diffusely dilated from the level of the superior pancreatic head, measuring up to 0.7 cm. 3. In the inferior pancreatic head and uncinate, there is a multilobulated, fluid signal cystic lesion measuring 1.8 x 1.0 cm. Due to breath motion artifact it is difficult to determine whether this communicates to the adjacent duct. There are multiple additional subcentimeter cystic lesions scattered throughout the pancreas, several of which clearly communicate to the main pancreatic duct. 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 Recommend EUS/FNA for further diagnosis given the presence of pancreatic ductal dilatation. 4. Status post left nephrectomy. 5. No evidence of recurrent or metastatic disease in the abdomen.   12/25/2021 Procedure   UPPER ENDOSCOPIC ULTRASOUND-By Dr. Meridee Score  - A mass-like region was identified in the pancreatic head where the pancreatic duct dilates with multiple cystic regions noted throughout the pancreas as well. 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 However, the endosonographic appearance is suspicious for potential adenocarcinoma. This was staged T2 N0 Mx by endosonographic criteria. T - No malignant-appearing lymph nodes were visualized in the celiac region (level 20), peripancreatic region and porta hepatis region.   12/25/2021 Pathology Results   CYTOLOGY - NON PAP  CASE: [id number]   FINAL MICROSCOPIC DIAGNOSIS:  A. PANCREAS, HEAD, FINE NEEDLE ASPIRATION:  - Malignant cells consistent with adenocarcinoma     01/01/2022 Initial Diagnosis   Pancreatic cancer (HCC)   01/13/2022 - 04/01/2022 Chemotherapy   Patient is on Treatment Plan : PANCREAS Gemcitabine D1,15  q28d x 4 Cycles      01/26/2022 Cancer Staging   Staging form: Exocrine Pancreas, AJCC 8th Edition - Clinical: Stage IB (cT2, cN0, cM0) - Signed by Malachy Mood, MD on 01/26/2022 Total positive nodes: 0    Genetic Testing   [facility] CancerNext-Expanded Panel was Negative. Of note, a variant of uncertain significance was identified in the BLM gene (p.[id number]) and GALNT12 gene ([id number]). Report date is 01/26/2022.  The CancerNext-Expanded gene panel offered by [facility] and includes sequencing, rearrangement, and RNA analysis for the following 77 genes: AIP, ALK, APC, ATM, AXIN2, BAP1, BARD1, BLM, BMPR1A, BRCA1, BRCA2, BRIP1, CDC73, CDH1, CDK4, CDKN1B, CDKN2A, CHEK2, CTNNA1, DICER1, FANCC, FH, FLCN, GALNT12, KIF1B, LZTR1, MAX, MEN1, MET, MLH1, MSH2, MSH3, MSH6, MUTYH, NBN, NF1, NF2, NTHL1, PALB2, PHOX2B, PMS2, POT1, PRKAR1A, PTCH1, PTEN, RAD51C, RAD51D, RB1, RECQL, RET, SDHA, SDHAF2, SDHB, SDHC, SDHD, SMAD4, SMARCA4, SMARCB1, SMARCE1, STK11, SUFU, TMEM127, TP53, TSC1, TSC2, VHL and XRCC2 (sequencing and deletion/duplication); EGFR, EGLN1, HOXB13, KIT, MITF, PDGFRA, POLD1, and POLE (sequencing only); EPCAM and GREM1 (deletion/duplication only).    08/12/2022 Imaging    IMPRESSION: 1. Slight interval increase in size of the pancreatic head-uncinate process mass with increasing direct contact of the SMA and portal vein as discussed. Also with a replaced RIGHT hepatic artery which passes through the tumor. 2. No signs of acute inflammation currently about the pancreas. With similar appearance of ductal obstruction and peripheral atrophy of pancreas due to the tumor. 3. Post LEFT nephrectomy. 4. Sclerotic bony lesions of the LEFT ischium and LEFT iliac bone similar to prior imaging. 5. Mild hyperenhancement of LEFT prostate 14 mm with asymmetry, nonspecific. This is unchanged in this patient with history of prostate neoplasm. 6. Aortic atherosclerosis.   07/29/2023 -  Chemotherapy   Patient is on Treatment  Plan : PANCREATIC Abraxane D1,8,15 + Gemcitabine D1,8,15 q28d     08/03/2023 Imaging   CT chest abdomen pelvis with contrast IMPRESSION: 1. No substantial change in size of heterogeneously enhancing pancreatic head mass with adjacent fiducials. 2. Slightly increased main pancreatic ductal dilation and mild intrahepatic bile duct dilation. 3. No evidence of new or progressive metastatic disease in the abdomen or pelvis. 4. Similar mildly sclerotic focus in the left ischial tuberosity dating back to 01/07/2019. A left iliac bone faintly sclerotic lesion measuring 1.4 cm has decreased in density since 2020, likely treated osseous metastasis. 5. Left lower lobe nodules measuring up to 5 mm are unchanged. 6. Aortic Atherosclerosis (ICD10-I70.0).        CURRENT THERAPY: Dose reduced gemcitabine and Abraxane every 2 weeks, starting 07/29/2023  INTERVAL HISTORY [name] [name] returns with his daughter for follow-up and treatment as scheduled.  Last seen 1/3 for cycle 2-day 15 dose reduced gem/Abraxane.  He remains very fatigued without much recovery between chemo doses.  He is up out of bed and mobile at home but not able to walk the 1/5th mile loop at his residence anymore, which he would do up to 5 separate times daily before chemo.  Having some difficulty swallowing and is requesting hydration with chemo today.  If he eats too much/too fast he starts coughing and can't continue, makes it difficult to hydrate. Denies nausea/vomiting.  Bowels moving with MiraLAX, stool softener, and the occasional suppository.  In the past week he developed bilateral low rib cage pain which he feels is "inside."  Has some exertional dyspnea but none at rest, denies significant cough, chest pain, leg pain or edema.  He is on  Eliquis for A-fib and is compliant.  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)    Prostate cancer metastatic to bone (HCC)    REM sleep behavior disorder    Sleep apnea    BiPap - uses nightly     Past Surgical History:  Procedure Laterality Date   ANTERIOR APPROACH HEMI HIP ARTHROPLASTY Right 02/05/2023   Procedure: ANTERIOR APPROACH HEMI HIP ARTHROPLASTY;  Surgeon: Jodi Geralds, MD;  Location: [facility] OR;  Service: Orthopedics;  Laterality: Right;   APPENDECTOMY  1963   BIOPSY  12/25/2021   Procedure: BIOPSY;  Surgeon: Meridee Score Netty Starring., MD;  Location: [facility] ENDOSCOPY;  Service: Gastroenterology;;   BRONCHIAL BIOPSY  07/05/2023   Procedure: BRONCHIAL BIOPSIES;  Surgeon: Josephine Igo, DO;  Location: [facility] ENDOSCOPY;  Service: Pulmonary;;   COLONOSCOPY  2010, 2015   ESOPHAGOGASTRODUODENOSCOPY N/A 04/16/2022   Procedure: ESOPHAGOGASTRODUODENOSCOPY (EGD);  Surgeon: Lemar Lofty., MD;  Location: [facility] ENDOSCOPY;  Service: Gastroenterology;  Laterality: N/A;   ESOPHAGOGASTRODUODENOSCOPY (EGD) WITH PROPOFOL N/A 12/25/2021   Procedure: ESOPHAGOGASTRODUODENOSCOPY (EGD) WITH PROPOFOL;  Surgeon: Meridee Score Netty Starring., MD;  Location: [facility] ENDOSCOPY;  Service: Gastroenterology;  Laterality: N/A;   EUS N/A 12/25/2021   Procedure: UPPER ENDOSCOPIC ULTRASOUND (EUS) RADIAL;  Surgeon: Lemar Lofty., MD;  Location: [facility] ENDOSCOPY;  Service: Gastroenterology;  Laterality: N/A;   EUS N/A 04/16/2022   Procedure: UPPER ENDOSCOPIC ULTRASOUND (EUS) RADIAL;  Surgeon: Lemar Lofty., MD;  Location: [facility] ENDOSCOPY;  Service: Gastroenterology;  Laterality: N/A;   FIDUCIAL MARKER PLACEMENT N/A 04/16/2022   Procedure: FIDUCIAL MARKER PLACEMENT;  Surgeon: Lemar Lofty., MD;  Location: [facility] ENDOSCOPY;  Service: Gastroenterology;  Laterality: N/A;   FINE NEEDLE ASPIRATION  12/25/2021   Procedure: FINE NEEDLE  ASPIRATION (FNA) LINEAR;  Surgeon: Lemar Lofty., MD;  Location: [facility] ENDOSCOPY;  Service: Gastroenterology;;   IR IMAGING GUIDED PORT INSERTION  07/28/2023   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   KYPHOPLASTY N/A 08/15/2020   Procedure: L2 compression fracture;  Surgeon: Kennedy Bucker, MD;  Location: [facility] ORS;  Service: Orthopedics;  Laterality: N/A;   KYPHOPLASTY N/A 08/29/2020   Procedure: T8 KYPHOPLASTY;  Surgeon: Kennedy Bucker, MD;  Location: [facility] ORS;  Service: Orthopedics;  Laterality: N/A;   KYPHOPLASTY N/A 11/12/2020   Procedure: L1 KYPHOPLASTY;  Surgeon: Kennedy Bucker, MD;  Location: [facility] ORS;  Service: Orthopedics;  Laterality: N/A;   PORTACATH PLACEMENT N/A 02/05/2022   Procedure: INSERTION PORT-A-CATH;  Surgeon: Fritzi Mandes, MD;  Location: [facility] ORS;  Service: General;  Laterality: N/A;     Outpatient Encounter Medications as of 09/23/2023  Medication Sig Note   acetaminophen (TYLENOL) 500 MG tablet Take 1 tablet (500 mg total) by mouth every 6 (six) hours as needed for moderate pain or mild pain.    ammonium lactate (LAC-HYDRIN) 12 % lotion Apply 1 Application topically as needed.    bisacodyl (DULCOLAX) 10 MG suppository Place 1 suppository (10 mg total) rectally daily as needed for moderate constipation.    carbidopa-levodopa (SINEMET CR) 50-200 MG tablet Take 1 tablet by mouth at bedtime.    carbidopa-levodopa (SINEMET IR) 25-100 MG tablet Take 3 tablets at 7 AM, 2 tablets at  11 AM, 3 tablets at 3 PM, 2 tablets at 7 PM    ciclopirox (PENLAC) 8 % solution Apply 1 Application topically at bedtime.    clonazePAM (KLONOPIN) 0.5 MG tablet Take 1 tablet (0.5 mg total) by mouth at bedtime.    cyanocobalamin (VITAMIN B12) 1000 MCG tablet Take 1,000 mcg by mouth daily.    diltiazem (CARDIZEM) 30 MG tablet Take 1 tablet (30 mg total) by mouth 2 (two) times daily. Do not take if systolic blood pressure (top number) is less than 100, or if pulse if less than 55.    ELIQUIS 5 MG TABS tablet  TAKE 1 TABLET TWICE A DAY    Glycerin-Hypromellose-PEG 400 (VISINE DRY EYE OP) Place 1 drop into both eyes 2 (two) times daily as needed (eye irritation).     ketoconazole (NIZORAL) 2 % shampoo Apply 1 application  topically 3 (three) times a week.    leuprolide, 6 Month, (ELIGARD) 45 MG injection Inject 45 mg into the skin every 6 (six) months. 07/02/2023: Next dose will be in Jan 2025.   lidocaine-prilocaine (EMLA) cream Apply to port site 1 to 2 hours prior to appointments/treatment    oxyCODONE (OXY IR/ROXICODONE) 5 MG immediate release tablet Take 1 tablet (5 mg total) by mouth every 6 (six) hours as needed for severe pain (pain score 7-10).    PANCREAZE 37000-97300 units CPEP TAKE 2 CAPSULES WITH BREAKFAST, LUNCH, AND EVENING MEAL (WITH EACH MEAL) AND 1 CAPSULE WITH SNACK    polyethylene glycol powder (MIRALAX) 17 GM/SCOOP powder Take 17 g by mouth in the morning, at noon, and at bedtime.    rosuvastatin (CRESTOR) 10 MG tablet TAKE 1 TABLET DAILY    tiZANidine (ZANAFLEX) 2 MG tablet Take 1 tablet (2 mg total) by mouth every 8 (eight) hours as needed for muscle spasms.    triamcinolone cream (KENALOG) 0.1 % Apply 1 application topically 2 (two) times daily. (Patient taking differently: Apply 1 application  topically 2 (two) times daily as needed (itching).)    zoledronic acid (RECLAST) 5 MG/100ML SOLN injection Inject 5 mg into the vein See admin instructions. Once a year 02/03/2023: Next infusion is scheduled for 09/08/2023.   [DISCONTINUED] omeprazole (PRILOSEC) 20 MG capsule Take 1 capsule (20 mg total) by mouth daily.    [DISCONTINUED] ondansetron (ZOFRAN) 4 MG tablet Take 1 tablet (4 mg total) by mouth every 6 (six) hours as needed for nausea.    [DISCONTINUED] prochlorperazine (COMPAZINE) 10 MG tablet Take 1 tablet (10 mg total) by mouth every 6 (six) hours as needed for nausea or vomiting.    omeprazole (PRILOSEC) 40 MG capsule Take 1 capsule (40 mg total) by mouth daily.    ondansetron  (ZOFRAN) 4 MG tablet Take 1 tablet (4 mg total) by mouth every 6 (six) hours as needed for nausea.    prochlorperazine (COMPAZINE) 10 MG tablet Take 1 tablet (10 mg total) by mouth every 6 (six) hours as needed for nausea or vomiting.    No facility-administered encounter medications on file as of 09/23/2023.     Today's Vitals   09/23/23 1042 09/23/23 1043  BP: 97/81   Pulse: 97   Resp: 16   Temp: 97.7 F (36.5 C)   TempSrc: Temporal   SpO2: 97%   Weight: 145 lb (65.8 kg)   Height: 5\' 5"  (1.651 m)   PainSc:  5    Pulse ox remained 97% on room air during ambulation, HR up to 122, currently in A-fib  Body mass index is 24.13 kg/m.   PHYSICAL EXAM GENERAL:alert, no distress and comfortable SKIN: forearms dry with erythema but no rash  EYES: sclera clear NECK: without mass LYMPH:  no palpable cervical or supraclavicular lymphadenopathy  LUNGS: clear with normal breathing effort HEART: A-fib, regular rate, no lower extremity edema ABDOMEN: abdomen soft, non-tender and normal bowel sounds NEURO: alert & oriented x 3 with fluent speech, generalized weakness MSK: no rib ttp PAC without erythema    CBC    Component Value Date/Time   WBC 7.6 09/23/2023 0957   WBC 8.8 08/01/2023 0420   RBC 3.33 (L) 09/23/2023 0957   HGB 10.5 (L) 09/23/2023 0957   HGB 13.0 12/04/2021 1359   HCT 32.3 (L) 09/23/2023 0957   HCT 37.7 12/04/2021 1359   PLT 180 09/23/2023 0957   PLT 317 01/11/2019 1025   MCV 97.0 09/23/2023 0957   MCV 92 01/11/2019 1025   MCH 31.5 09/23/2023 0957   MCHC 32.5 09/23/2023 0957   RDW 17.2 (H) 09/23/2023 0957   RDW 12.7 01/11/2019 1025   LYMPHSABS 0.9 09/23/2023 0957   LYMPHSABS 1.7 01/11/2019 1025   MONOABS 1.1 (H) 09/23/2023 0957   EOSABS 0.3 09/23/2023 0957   EOSABS 0.1 01/11/2019 1025   BASOSABS 0.0 09/23/2023 0957   BASOSABS 0.1 01/11/2019 1025     CMP     Component Value Date/Time   NA 141 09/23/2023 0957   NA 139 12/04/2021 1359   K 4.3 09/23/2023  0957   CL 108 09/23/2023 0957   CO2 28 09/23/2023 0957   GLUCOSE 118 (H) 09/23/2023 0957   BUN 19 09/23/2023 0957   BUN 20 12/04/2021 1359   CREATININE 0.96 09/23/2023 0957   CALCIUM 9.6 09/23/2023 0957   PROT 6.5 09/23/2023 0957   PROT 6.8 12/04/2021 1359   ALBUMIN 3.8 09/23/2023 0957   ALBUMIN 4.6 12/04/2021 1359   AST 24 09/23/2023 0957   ALT <5 09/23/2023 0957   ALKPHOS 61 09/23/2023 0957   BILITOT 0.7 09/23/2023 0957   GFRNONAA >60 09/23/2023 0957   GFRAA 47 (L) 05/06/2020 0935     ASSESSMENT & PLAN: 83 year old male  Pancreatic cancer, initially cT2 N0 M0 stage Ib -diagnosed in 09/2021, deemed poor surgical candidate due to his age and medical co-morbidities -S/p single agent gemcitabine 01/13/22 - 04/01/22 and 5 fx SBRT [name]) 05/04/22 - 05/14/22. CA 19-9 decreased after treatment -He was admitted for right femoral neck fracture  on 02/03/23 after a fall and had surgery  -CT scan from 05/17/23 showed multiple lung nodules, with dominant 1.5 cm nodule in the right middle lobe, highly concerning for metastasis.  His lung lesions are positive on PET scan.  CA 19-9 increased from 200 range up to 330 -Bronchoscopy and biopsy attempted, which showed atypical cells. -He restarted chemo with dose reduced gemcitabine and abraxane every 2 weeks on 07/29/2023 (CA 19-9 at 704 on day 1) -[name] [name] appears frail but stable, s/p 2 cycles of palliative gem/Abraxane. Tolerating fairly well overall, with mild nausea and constipation. - He has progressive fatigue, activity intolerance, and low performance status, possibly partially related to Parkinson's disease, but more likely chemotoxicity.  -No hypoxia on ambulation. On eliquis for A-fib, doubt PE -He has rib cage pain, continue oxycodone PRN and I referred him to palliative care -Reviewed symptom management and supportive care, will hydrate with chemo today and see if we can arrange home care IV fluids.  -We discussed goals of care, he would  like  to continue chemo for now -Labs reviewed, adequate for treatment, I recommend to keep gemcitabine at 800 mg/m and further reduce Abraxane to 80 mg/m.  Pt agrees -Will follow-up the pending CA 19-9 from today.  Proceed with restaging 1/24 as scheduled -Follow-up with scan results and next treatment in 2 weeks   PLAN: -Labs reviewed -Proceed with C3 D1 dose reduced gem/abraxane, will further reduce abraxane for low PS. 500 cc NS with chemo -Restage 1/24 as scheduled -Symptom management for pain, constipation, fatigue, etc -Increase omeprazole, refill zofran and compazine -Referral to palliative care -IVF at home, per pt request; will set up -F/up and C3 D15 in 2 weeks   Orders Placed This Encounter  Procedures   CBC with Differential (Cancer Center Only)    Standing Status:   Future    Expected Date:   10/21/2023    Expiration Date:   10/20/2024   CMP (Cancer Center only)    Standing Status:   Future    Expected Date:   10/21/2023    Expiration Date:   10/20/2024   CBC with Differential (Cancer Center Only)    Standing Status:   Future    Expected Date:   11/04/2023    Expiration Date:   11/03/2024   CMP (Cancer Center only)    Standing Status:   Future    Expected Date:   11/04/2023    Expiration Date:   11/03/2024   Amb Referral to Palliative Care    Referral Priority:   Routine    Referral Type:   Consultation    Number of Visits Requested:   1      All questions were answered. The patient knows to call the clinic with any problems, questions or concerns. No barriers to learning were detected. I spent 30 minutes counseling the patient face to face. The total time spent in the appointment was 40 minutes and more than 50% was on counseling, review of test results, and coordination of care.   Santiago Glad, NP-C 09/23/2023

## 2023-09-24 ENCOUNTER — Ambulatory Visit: Payer: Medicare Other | Admitting: Cardiology

## 2023-09-24 ENCOUNTER — Ambulatory Visit (HOSPITAL_COMMUNITY)
Admission: RE | Admit: 2023-09-24 | Discharge: 2023-09-24 | Disposition: A | Discharge status: Home / Self Care | Payer: Medicare Other | Source: Ambulatory Visit | Attending: Internal Medicine | Admitting: Internal Medicine

## 2023-09-24 VITALS — BP 102/80 | HR 113 | Ht 65.0 in | Wt 145.2 lb

## 2023-09-24 DIAGNOSIS — I480 Paroxysmal atrial fibrillation: Secondary | ICD-10-CM | POA: Diagnosis not present

## 2023-09-24 DIAGNOSIS — D6869 Other thrombophilia: Secondary | ICD-10-CM | POA: Diagnosis not present

## 2023-09-24 DIAGNOSIS — Z7901 Long term (current) use of anticoagulants: Secondary | ICD-10-CM | POA: Insufficient documentation

## 2023-09-24 LAB — CANCER ANTIGEN 19-9: CA 19-9: 757 U/mL — ABNORMAL HIGH (ref 0–35)

## 2023-09-24 NOTE — Progress Notes (Signed)
Primary Care Physician: Lorenda Ishihara, MD Primary Cardiologist: Tessa Lerner, DO Electrophysiologist: None     Referring Physician: [name] is a 83 y.o. male with a history of pancreatic cancer undergoing chemotherapy infusion, kidney donor, former cigar smoker, OSA on BiPAP, Parkinson's disease, aortic atherosclerosis, and paroxysmal atrial flutter / fibrillation who presents for consultation in the [facility]. Cancer center on 08/27/23 noted patient to have HR 29-121 while receiving infusion. Hypotension noted intermittently with blurry vision as a symptom. Dr. Odis Hollingshead recommended on 09/09/23 for patient to switch back to diltiazem 30 mg BID with option of taking q 6 hr PRN and to hold medication if SBP less than 100 and/or HR < 55. Patient is on Eliquis 5 mg BID for a CHADS2VASC score of 3.  On evaluation today, he is currently in Afib with RVR. He does not have cardiac awareness. He feels tired but notes this is also related to his chemotherapy. He notes whenever he takes diltiazem 30 mg it lowers his BP a little bit but does not really lower his HR at all. He has previously taken higher doses of diltiazem but then would have to constantly hold doses due to hypotension. He is here with his daughter today who notes he recently has had a higher dose of chemotherapy and that's when they noticed increased Afib / HR variability. He has just transitioned to a lower dose of chemotherapy. They note he has one more chemo session and then after have a discussion as whether to continue chemo at a higher dose or lower dose. It appears from daughter the decision whether to continue chemo may be up to the patient. No misses does of Eliquis.   Today, he denies symptoms of palpitations, chest pain, shortness of breath, orthopnea, PND, lower extremity edema, dizziness, presyncope, syncope, snoring, daytime somnolence, bleeding, or neurologic sequela. The  patient is tolerating medications without difficulties and is otherwise without complaint today.    Atrial Fibrillation Risk Factors:  he does have symptoms or diagnosis of sleep apnea. he is compliant with BiPAP therapy.  he has a BMI of Body mass index is 24.16 kg/m.Marland Kitchen Filed Weights   09/24/23 1012  Weight: 65.9 kg    Current Outpatient Medications  Medication Sig Dispense Refill   acetaminophen (TYLENOL) 500 MG tablet Take 1 tablet (500 mg total) by mouth every 6 (six) hours as needed for moderate pain or mild pain. (Patient taking differently: Take 500 mg by mouth as needed for moderate pain (pain score 4-6) or mild pain (pain score 1-3).) 30 tablet 0   ammonium lactate (LAC-HYDRIN) 12 % lotion Apply 1 Application topically as needed.     bisacodyl (DULCOLAX) 10 MG suppository Place 1 suppository (10 mg total) rectally daily as needed for moderate constipation. 12 suppository 0   carbidopa-levodopa (SINEMET CR) 50-200 MG tablet Take 1 tablet by mouth at bedtime. 90 tablet 0   carbidopa-levodopa (SINEMET IR) 25-100 MG tablet Take 3 tablets at 7 AM, 2 tablets at 11 AM, 3 tablets at 3 PM, 2 tablets at 7 PM 900 tablet 0   ciclopirox (PENLAC) 8 % solution Apply 1 Application topically at bedtime.     clonazePAM (KLONOPIN) 0.5 MG tablet Take 1 tablet (0.5 mg total) by mouth at bedtime. 5 tablet 0   cyanocobalamin (VITAMIN B12) 1000 MCG tablet Take 1,000 mcg by mouth daily.     diltiazem (CARDIZEM) 30 MG tablet Take 1  tablet (30 mg total) by mouth 2 (two) times daily. Do not take if systolic blood pressure (top number) is less than 100, or if pulse if less than 55. 90 tablet 3   ELIQUIS 5 MG TABS tablet TAKE 1 TABLET TWICE A DAY 120 tablet 5   Glycerin-Hypromellose-PEG 400 (VISINE DRY EYE OP) Place 1 drop into both eyes 2 (two) times daily as needed (eye irritation).      ketoconazole (NIZORAL) 2 % shampoo Apply 1 application  topically 3 (three) times a week.     leuprolide, 6 Month,  (ELIGARD) 45 MG injection Inject 45 mg into the skin every 6 (six) months.     lidocaine-prilocaine (EMLA) cream Apply to port site 1 to 2 hours prior to appointments/treatment 30 g 3   omeprazole (PRILOSEC) 40 MG capsule Take 1 capsule (40 mg total) by mouth daily. 30 capsule 3   ondansetron (ZOFRAN) 4 MG tablet Take 1 tablet (4 mg total) by mouth every 6 (six) hours as needed for nausea. 30 tablet 3   oxyCODONE (OXY IR/ROXICODONE) 5 MG immediate release tablet Take 1 tablet (5 mg total) by mouth every 6 (six) hours as needed for severe pain (pain score 7-10). 90 tablet 0   PANCREAZE 37000-97300 units CPEP TAKE 2 CAPSULES WITH BREAKFAST, LUNCH, AND EVENING MEAL (WITH EACH MEAL) AND 1 CAPSULE WITH SNACK 600 capsule 5   polyethylene glycol powder (MIRALAX) 17 GM/SCOOP powder Take 17 g by mouth in the morning, at noon, and at bedtime.     prochlorperazine (COMPAZINE) 10 MG tablet Take 1 tablet (10 mg total) by mouth every 6 (six) hours as needed for nausea or vomiting. 30 tablet 2   rosuvastatin (CRESTOR) 10 MG tablet TAKE 1 TABLET DAILY 90 tablet 1   tiZANidine (ZANAFLEX) 2 MG tablet Take 1 tablet (2 mg total) by mouth every 8 (eight) hours as needed for muscle spasms. 10 tablet 0   triamcinolone cream (KENALOG) 0.1 % Apply 1 application topically 2 (two) times daily. (Patient taking differently: Apply 1 application  topically 2 (two) times daily as needed (itching).) 453.6 g 1   zoledronic acid (RECLAST) 5 MG/100ML SOLN injection Inject 5 mg into the vein See admin instructions. Once a year     No current facility-administered medications for this encounter.    Atrial Fibrillation Management history:  Previous antiarrhythmic drugs: none Previous cardioversions: 01/2023, 05/09/23 (ED) Previous ablations: none Anticoagulation history: Eliquis   ROS- All systems are reviewed and negative except as per the HPI above.  Physical Exam: BP 102/80   Pulse (!) 113   Ht 5\' 5"  (1.651 m)   Wt 65.9 kg    BMI 24.16 kg/m   GEN: Well nourished, well developed in no acute distress NECK: No JVD; No carotid bruits CARDIAC: Irregularly irregular tachycardic rate and rhythm, no murmurs, rubs, gallops RESPIRATORY:  Clear to auscultation without rales, wheezing or rhonchi  ABDOMEN: Soft, non-tender, non-distended EXTREMITIES:  No edema; No deformity   EKG today demonstrates  Vent. rate 113 BPM PR interval * ms QRS duration 126 ms QT/QTcB 362/496 ms P-R-T axes * -12 -60 Atrial fibrillation with rapid ventricular response Right bundle branch block Abnormal ECG When compared with ECG of 27-Aug-2023 10:56, PREVIOUS ECG IS PRESENT  Echocardiogram 04/30/2021:  Normal LV systolic function with EF 63%. Moderate concentric hypertrophy  of the left ventricle. Normal global wall motion. Left ventricle cavity is  normal in size. Doppler evidence of grade II (pseudonormal) diastolic  dysfunction,  elevated LAP.  Left atrial cavity is moderately dilated.  Trileaflet aortic valve.  Moderate (Grade II) aortic regurgitation.  Mild to moderate mitral regurgitation.  Mild tricuspid regurgitation.  No evidence of pulmonary hypertension.   ASSESSMENT & PLAN CHA2DS2-VASc Score = 3  The patient's score is based upon: CHF History: 0 HTN History: 0 Diabetes History: 0 Stroke History: 0 Vascular Disease History: 1 Age Score: 2 Gender Score: 0       ASSESSMENT AND PLAN: Paroxysmal Atrial Fibrillation (ICD10:  I48.0) The patient's CHA2DS2-VASc score is 3, indicating a 3.2% annual risk of stroke.    He is currently in Afib with RVR. Patient appears to have a difficult course for adequate rate control given his naturally low blood pressure. We discussed Dr. Emelda Brothers recommendation of diltiazem 30 mg BID and PRN q 6 hr. I advised this is reasonable given his previous intolerance of higher doses due to symptomatic hypotension. He has ongoing chemotherapy so this is prohibitive of AAD therapy due to  interaction between medications. Technically, an ablation could be an option for rhythm control. However, given patient's current diagnosis and ongoing chemotherapy there would likely require further discussion between physicians regarding invasive treatment vs long term goals of care. For now, it seems reasonable to continue with rate control with diltiazem. If in the future, ablation is deemed as a reasonable option, it would be reasonable to reach out to EP to consider if patient is even a candidate for ablation (which may depend on prognosis of current condition).   Secondary Hypercoagulable State (ICD10:  D68.69) The patient is at significant risk for stroke/thromboembolism based upon his CHA2DS2-VASc Score of 3.  Continue Apixaban (Eliquis).  No missed doses.    Follow up Afib clinic prn.    Lake Bells, PA-C  Afib Clinic [facility] [address] [place], [place] [address] [phone]

## 2023-09-28 ENCOUNTER — Encounter: Payer: Self-pay | Admitting: Hematology

## 2023-09-29 ENCOUNTER — Other Ambulatory Visit: Payer: Self-pay

## 2023-09-30 NOTE — Progress Notes (Signed)
Palliative Medicine [facility]  [phone] [phone]   Name: [name] [name] Date: 09/30/2023 MRN: [id number]  DOB: [birth year]  Patient Care Team: Lorenda Ishihara, MD as PCP - General (Internal Medicine) Tessa Lerner, DO as PCP - Cardiology (Cardiology) Tat, Octaviano Batty, DO as Consulting Physician (Neurology) Earna Coder, MD as Consulting Physician (Hematology and Oncology) Fritzi Mandes, MD as Consulting Physician (General Surgery) Malachy Mood, MD as Consulting Physician (Hematology and Oncology) Josephine Igo, DO as Consulting Physician (Pulmonary Disease)    REASON FOR CONSULTATION: [name] [name] is a 83 y.o. 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 [name] [name]' history also includes  pancreatic cancer (09/2021) as well as parkinson's disease. Palliative ask to see for symptom management and goals of care.    SOCIAL HISTORY:     reports that he quit smoking about 13 years ago. His smoking use included cigars. He has never used smokeless tobacco. He reports that he does not currently use alcohol after a past usage of about 4.0 standard drinks of alcohol per week. He reports that he does not use drugs.  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)    Prostate cancer metastatic to bone (HCC)    REM sleep behavior disorder    Sleep apnea    BiPap - uses nightly    PAST SURGICAL HISTORY:  Past Surgical History:  Procedure Laterality Date   ANTERIOR APPROACH HEMI HIP ARTHROPLASTY Right 02/05/2023   Procedure: ANTERIOR  APPROACH HEMI HIP ARTHROPLASTY;  Surgeon: Jodi Geralds, MD;  Location: [facility] OR;  Service: Orthopedics;  Laterality: Right;   APPENDECTOMY  1963   BIOPSY  12/25/2021   Procedure: BIOPSY;  Surgeon: Meridee Score Netty Starring., MD;  Location: [facility] ENDOSCOPY;  Service: Gastroenterology;;   BRONCHIAL BIOPSY  07/05/2023   Procedure: BRONCHIAL BIOPSIES;  Surgeon: Josephine Igo, DO;  Location: [facility] ENDOSCOPY;  Service: Pulmonary;;   COLONOSCOPY  2010, 2015   ESOPHAGOGASTRODUODENOSCOPY N/A 04/16/2022   Procedure: ESOPHAGOGASTRODUODENOSCOPY (EGD);  Surgeon: Lemar Lofty., MD;  Location: [facility] ENDOSCOPY;  Service: Gastroenterology;  Laterality: N/A;   ESOPHAGOGASTRODUODENOSCOPY (EGD) WITH PROPOFOL N/A 12/25/2021   Procedure: ESOPHAGOGASTRODUODENOSCOPY (EGD) WITH PROPOFOL;  Surgeon: Meridee Score Netty Starring., MD;  Location: [facility] ENDOSCOPY;  Service: Gastroenterology;  Laterality: N/A;   EUS N/A 12/25/2021   Procedure: UPPER ENDOSCOPIC ULTRASOUND (EUS) RADIAL;  Surgeon: Lemar Lofty., MD;  Location: [facility] ENDOSCOPY;  Service: Gastroenterology;  Laterality: N/A;   EUS N/A 04/16/2022   Procedure: UPPER ENDOSCOPIC ULTRASOUND (EUS) RADIAL;  Surgeon: Lemar Lofty., MD;  Location: [facility] ENDOSCOPY;  Service: Gastroenterology;  Laterality: N/A;   FIDUCIAL MARKER PLACEMENT N/A 04/16/2022   Procedure: FIDUCIAL MARKER PLACEMENT;  Surgeon: Lemar Lofty., MD;  Location: [facility] ENDOSCOPY;  Service: Gastroenterology;  Laterality: N/A;   FINE NEEDLE ASPIRATION  12/25/2021   Procedure: FINE NEEDLE ASPIRATION (FNA) LINEAR;  Surgeon: Lemar Lofty., MD;  Location: [facility] ENDOSCOPY;  Service: Gastroenterology;;   IR IMAGING GUIDED PORT INSERTION  07/28/2023   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   KYPHOPLASTY N/A 08/15/2020  Procedure: L2 compression fracture;  Surgeon: Kennedy Bucker, MD;  Location: [facility] ORS;  Service: Orthopedics;  Laterality: N/A;   KYPHOPLASTY N/A 08/29/2020   Procedure: T8 KYPHOPLASTY;  Surgeon:  Kennedy Bucker, MD;  Location: [facility] ORS;  Service: Orthopedics;  Laterality: N/A;   KYPHOPLASTY N/A 11/12/2020   Procedure: L1 KYPHOPLASTY;  Surgeon: Kennedy Bucker, MD;  Location: [facility] ORS;  Service: Orthopedics;  Laterality: N/A;   PORTACATH PLACEMENT N/A 02/05/2022   Procedure: INSERTION PORT-A-CATH;  Surgeon: Fritzi Mandes, MD;  Location: [facility] ORS;  Service: General;  Laterality: N/A;    HEMATOLOGY/ONCOLOGY HISTORY:  Oncology History Overview Note  # PROSTATE CANCER- METATSTATIC to BONE; PSA- 26.5. Sclerotic 1.5 cm left ischial lesion/ Sclerotic medial left iliac bone 1.5 cm lesion (series 2/image 47), increased from 1.0 cm. Gleason score of 4+5= 9; with almost all cores involved greater than 80%.  9/16 Lupron 47-month depot on 9/16. [Urology; Dr.Siniski]  # MID OCT 2020- Zytiga 1000 mg+ prednisone; stopped December 2021 [poor tolerance if with RVR]; DISCONTINUED.   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       Prostate cancer metastatic to bone (HCC)  05/24/2019 Initial Diagnosis   Prostate cancer metastatic to bone [facility])   Pancreatic cancer (HCC)  10/02/2021 Imaging   CT ABDOMEN PELVIS W CONTRAST   IMPRESSION: 1. There is new pancreatic ductal dilation with an indeterminate 19 mm hypodense low-density mass area in the head/uncinate process of the pancreas. Recommend further evaluation with dedicated MRI/MRCP with contrast. 2. No evidence of bowel obstruction.  Moderate rectal stool ball. 3. Scattered peripherally located clustered pulmonary nodules in the RIGHT middle lobe. Findings are likely infectious or inflammatory in etiology. Given history of malignancy, recommend follow-up as per clinical protocol.   10/28/2021 Imaging   MR ABDOMEN MRCP W WO CONTAST    IMPRESSION: 1. Examination is significantly limited by breath motion artifact throughout. 2. The main pancreatic duct is diffusely dilated from the level of the superior pancreatic head, measuring up to 0.7 cm. 3. In the inferior pancreatic head and uncinate, there is a multilobulated, fluid signal cystic lesion measuring 1.8 x 1.0 cm. Due to breath motion artifact it is difficult to determine whether this communicates to the adjacent duct. There are multiple additional subcentimeter cystic lesions scattered throughout the pancreas, several of which clearly communicate to the main pancreatic duct. 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 Recommend EUS/FNA for further diagnosis given the presence of pancreatic ductal dilatation. 4. Status post left nephrectomy. 5. No evidence of recurrent or metastatic disease in the abdomen.   12/25/2021 Procedure   UPPER ENDOSCOPIC ULTRASOUND-By Dr. Meridee Score  - A mass-like region was identified in the pancreatic head where the pancreatic duct dilates with multiple cystic regions noted throughout the pancreas as well. 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 However, the endosonographic appearance is suspicious for potential adenocarcinoma. This was staged T2 N0 Mx by endosonographic criteria. T - No malignant-appearing lymph nodes were visualized in the celiac region (level 20), peripancreatic region and porta hepatis region.   12/25/2021 Pathology Results   CYTOLOGY - NON PAP  CASE: [id number]   FINAL MICROSCOPIC DIAGNOSIS:  A. PANCREAS, HEAD, FINE NEEDLE ASPIRATION:  - Malignant cells consistent with adenocarcinoma  01/01/2022 Initial Diagnosis   Pancreatic cancer (HCC)   01/13/2022 - 04/01/2022 Chemotherapy   Patient is on Treatment Plan : PANCREAS Gemcitabine D1,15 q28d x 4 Cycles     01/26/2022 Cancer Staging   Staging form: Exocrine Pancreas, AJCC 8th  Edition - Clinical: Stage IB (cT2, cN0, cM0) - Signed by Malachy Mood, MD on 01/26/2022 Total positive nodes: 0    Genetic Testing   [facility] CancerNext-Expanded Panel was Negative. Of note, a variant of uncertain significance was identified in the BLM gene (p.[id number]) and GALNT12 gene ([id number]). Report date is 01/26/2022.  The CancerNext-Expanded gene panel offered by [facility] and includes sequencing, rearrangement, and RNA analysis for the following 77 genes: AIP, ALK, APC, ATM, AXIN2, BAP1, BARD1, BLM, BMPR1A, BRCA1, BRCA2, BRIP1, CDC73, CDH1, CDK4, CDKN1B, CDKN2A, CHEK2, CTNNA1, DICER1, FANCC, FH, FLCN, GALNT12, KIF1B, LZTR1, MAX, MEN1, MET, MLH1, MSH2, MSH3, MSH6, MUTYH, NBN, NF1, NF2, NTHL1, PALB2, PHOX2B, PMS2, POT1, PRKAR1A, PTCH1, PTEN, RAD51C, RAD51D, RB1, RECQL, RET, SDHA, SDHAF2, SDHB, SDHC, SDHD, SMAD4, SMARCA4, SMARCB1, SMARCE1, STK11, SUFU, TMEM127, TP53, TSC1, TSC2, VHL and XRCC2 (sequencing and deletion/duplication); EGFR, EGLN1, HOXB13, KIT, MITF, PDGFRA, POLD1, and POLE (sequencing only); EPCAM and GREM1 (deletion/duplication only).    08/12/2022 Imaging    IMPRESSION: 1. Slight interval increase in size of the pancreatic head-uncinate process mass with increasing direct contact of the SMA and portal vein as discussed. Also with a replaced RIGHT hepatic artery which passes through the tumor. 2. No signs of acute inflammation currently about the pancreas. With similar appearance of ductal obstruction and peripheral atrophy of pancreas due to the tumor. 3. Post LEFT nephrectomy. 4. Sclerotic bony lesions of the LEFT ischium and LEFT iliac bone similar to prior imaging. 5. Mild hyperenhancement of LEFT prostate 14 mm with asymmetry, nonspecific. This is unchanged in this patient with history of prostate neoplasm. 6. Aortic atherosclerosis.   07/29/2023 -  Chemotherapy   Patient is on Treatment Plan : PANCREATIC Abraxane D1,8,15 + Gemcitabine D1,8,15 q28d      08/03/2023 Imaging   CT chest abdomen pelvis with contrast IMPRESSION: 1. No substantial change in size of heterogeneously enhancing pancreatic head mass with adjacent fiducials. 2. Slightly increased main pancreatic ductal dilation and mild intrahepatic bile duct dilation. 3. No evidence of new or progressive metastatic disease in the abdomen or pelvis. 4. Similar mildly sclerotic focus in the left ischial tuberosity dating back to 01/07/2019. A left iliac bone faintly sclerotic lesion measuring 1.4 cm has decreased in density since 2020, likely treated osseous metastasis. 5. Left lower lobe nodules measuring up to 5 mm are unchanged. 6. Aortic Atherosclerosis (ICD10-I70.0).       ALLERGIES:  is allergic to influenza vaccines and influenza virus vaccine.  MEDICATIONS:  Current Outpatient Medications  Medication Sig Dispense Refill   acetaminophen (TYLENOL) 500 MG tablet Take 1 tablet (500 mg total) by mouth every 6 (six) hours as needed for moderate pain or mild pain. (Patient taking differently: Take 500 mg by mouth as needed for moderate pain (pain score 4-6) or mild pain (pain score 1-3).) 30 tablet 0   ammonium lactate (LAC-HYDRIN) 12 % lotion Apply 1 Application topically as needed.     bisacodyl (DULCOLAX) 10 MG suppository Place 1 suppository (10 mg total) rectally daily as needed for moderate constipation. 12 suppository 0   carbidopa-levodopa (SINEMET CR) 50-200 MG tablet Take 1 tablet by mouth at bedtime. 90 tablet 0   carbidopa-levodopa (SINEMET IR) 25-100 MG tablet Take 3 tablets at  7 AM, 2 tablets at 11 AM, 3 tablets at 3 PM, 2 tablets at 7 PM 900 tablet 0   ciclopirox (PENLAC) 8 % solution Apply 1 Application topically at bedtime.     clonazePAM (KLONOPIN) 0.5 MG tablet Take 1 tablet (0.5 mg total) by mouth at bedtime. 5 tablet 0   cyanocobalamin (VITAMIN B12) 1000 MCG tablet Take 1,000 mcg by mouth daily.     diltiazem (CARDIZEM) 30 MG tablet Take 1 tablet (30 mg total) by  mouth 2 (two) times daily. Do not take if systolic blood pressure (top number) is less than 100, or if pulse if less than 55. 90 tablet 3   ELIQUIS 5 MG TABS tablet TAKE 1 TABLET TWICE A DAY 120 tablet 5   Glycerin-Hypromellose-PEG 400 (VISINE DRY EYE OP) Place 1 drop into both eyes 2 (two) times daily as needed (eye irritation).      ketoconazole (NIZORAL) 2 % shampoo Apply 1 application  topically 3 (three) times a week.     leuprolide, 6 Month, (ELIGARD) 45 MG injection Inject 45 mg into the skin every 6 (six) months.     lidocaine-prilocaine (EMLA) cream Apply to port site 1 to 2 hours prior to appointments/treatment 30 g 3   omeprazole (PRILOSEC) 40 MG capsule Take 1 capsule (40 mg total) by mouth daily. 30 capsule 3   ondansetron (ZOFRAN) 4 MG tablet Take 1 tablet (4 mg total) by mouth every 6 (six) hours as needed for nausea. 30 tablet 3   oxyCODONE (OXY IR/ROXICODONE) 5 MG immediate release tablet Take 1 tablet (5 mg total) by mouth every 6 (six) hours as needed for severe pain (pain score 7-10). 90 tablet 0   PANCREAZE 37000-97300 units CPEP TAKE 2 CAPSULES WITH BREAKFAST, LUNCH, AND EVENING MEAL (WITH EACH MEAL) AND 1 CAPSULE WITH SNACK 600 capsule 5   polyethylene glycol powder (MIRALAX) 17 GM/SCOOP powder Take 17 g by mouth in the morning, at noon, and at bedtime.     prochlorperazine (COMPAZINE) 10 MG tablet Take 1 tablet (10 mg total) by mouth every 6 (six) hours as needed for nausea or vomiting. 30 tablet 2   rosuvastatin (CRESTOR) 10 MG tablet TAKE 1 TABLET DAILY 90 tablet 1   tiZANidine (ZANAFLEX) 2 MG tablet Take 1 tablet (2 mg total) by mouth every 8 (eight) hours as needed for muscle spasms. 10 tablet 0   triamcinolone cream (KENALOG) 0.1 % Apply 1 application topically 2 (two) times daily. (Patient taking differently: Apply 1 application  topically 2 (two) times daily as needed (itching).) 453.6 g 1   zoledronic acid (RECLAST) 5 MG/100ML SOLN injection Inject 5 mg into the vein  See admin instructions. Once a year     No current facility-administered medications for this visit.    VITAL SIGNS: There were no vitals taken for this visit. There were no vitals filed for this visit.  Estimated body mass index is 24.16 kg/m as calculated from the following:   Height as of 09/24/23: 5\' 5"  (1.651 m).   Weight as of 09/24/23: 145 lb 3.2 oz (65.9 kg).  LABS: CBC:    Component Value Date/Time   WBC 7.6 09/23/2023 0957   WBC 8.8 08/01/2023 0420   HGB 10.5 (L) 09/23/2023 0957   HGB 13.0 12/04/2021 1359   HCT 32.3 (L) 09/23/2023 0957   HCT 37.7 12/04/2021 1359   PLT 180 09/23/2023 0957   PLT 317 01/11/2019 1025   MCV 97.0 09/23/2023 0957  MCV 92 01/11/2019 1025   NEUTROABS 5.2 09/23/2023 0957   NEUTROABS 3.9 01/11/2019 1025   LYMPHSABS 0.9 09/23/2023 0957   LYMPHSABS 1.7 01/11/2019 1025   MONOABS 1.1 (H) 09/23/2023 0957   EOSABS 0.3 09/23/2023 0957   EOSABS 0.1 01/11/2019 1025   BASOSABS 0.0 09/23/2023 0957   BASOSABS 0.1 01/11/2019 1025   Comprehensive Metabolic Panel:    Component Value Date/Time   NA 141 09/23/2023 0957   NA 139 12/04/2021 1359   K 4.3 09/23/2023 0957   CL 108 09/23/2023 0957   CO2 28 09/23/2023 0957   BUN 19 09/23/2023 0957   BUN 20 12/04/2021 1359   CREATININE 0.96 09/23/2023 0957   GLUCOSE 118 (H) 09/23/2023 0957   CALCIUM 9.6 09/23/2023 0957   AST 24 09/23/2023 0957   ALT <5 09/23/2023 0957   ALKPHOS 61 09/23/2023 0957   BILITOT 0.7 09/23/2023 0957   PROT 6.5 09/23/2023 0957   PROT 6.8 12/04/2021 1359   ALBUMIN 3.8 09/23/2023 0957   ALBUMIN 4.6 12/04/2021 1359    RADIOGRAPHIC STUDIES: No results found.  PERFORMANCE STATUS (ECOG) : 1 - Symptomatic but completely ambulatory  Review of Systems  Constitutional:  Positive for activity change, appetite change and fatigue.  Musculoskeletal:  Positive for arthralgias and back pain.  Unless otherwise noted, a complete review of systems is negative.  Physical  Exam General: NAD Cardiovascular: regular rate and rhythm Pulmonary: clear ant fields Abdomen: soft, nontender, + bowel sounds Extremities: no edema, no joint deformities Skin: no rashes Neurological: Alert and oriented x3  IMPRESSION:  This is my initial visit with [name] [name]. He is accompanied by his son and adopted daughter [name], with his daughter Durenda Age on the phone. He is alert and oriented able to appropriately engage in discussions.   I introduced myself, Maygan RN, and Palliative's role in collaboration with the oncology team. Concept of Palliative Care was introduced as specialized medical care for people and their families living with serious illness.  It focuses on providing relief from the symptoms and stress of a serious illness.  The goal is to improve quality of life for both the patient and the family. Values and goals of care important to patient and family were attempted to be elicited.   [name] [name] lives alone in independent living facility. He has the 3 children. His daughter, Durenda Age lives in [place]. Is able to perform most ADLs independently however with limitations due to fatigue and pain.  He has Parkinson's disease, which affects his ability to taste, compounded by chemotherapy, leading to decreased appetite and weight loss from 145 pounds (1/17) to 139 pounds current weight. He feels appetite is slowly improving. We discussed overall nutrition in detail with ways to improve. He understands we will continue to closely monitor with consideration of appetite stimulant (Marinol) in the future if needed.   [name] [name] reports significant fatigue and decreased endurance, which he attributes to a 'double dose' of therapy. Despite this, he remains active and independent, using a rollator for mobility without constant assistance.  The patient reports experiencing significant pain, primarily in the lower back and chest wall area, described as discomfort and stabbing preventing  him from standing up straight. Pain levels can reach 8 or 9 out of 10 at his worst, but with the use of oxycodone tablets daily, the pain reduces to a 2 or 3. If he does not take his oxycodone states his pain is "off the chart". He also experiences high pain levels  in the rib and stomach area, somewhat controlled with oxycodone. Occasionally, he uses tizanidine for muscle relaxation. Miralax is used to manage bowel movements due to medication side effects.   We discussed his pain regimen at length. I have recommended increasing his oxycodone to every 4-6 hours as needed for pain. Continue with tizanidine as needed. We will continue to closely monitor and adjust as needed. Refills to be sent to pharmacy. Patient knows to contact office for refills.   All questions answered and support provided.  I discussed the importance of continued conversation with family and their medical providers regarding overall plan of care and treatment options, ensuring decisions are within the context of the patients values and GOCs.  PLAN: Established therapeutic relationship. Education provided on palliative's role in collaboration with their Oncology/Radiation team.  Cancer-related Pain Lower back discomfort, not severe pain. Currently managed with Oxycodone 5mg  three times daily. Pain at worst is 8-9/10, but with Oxycodone, it reduces to 2-3/10. -Increase Oxycodone 5mg  to every 4-6 hours as needed for pain. -Send refill prescription to [facility]. Patient aware if he requires home delivery can consider using [facility] outpatient pharmacy.   Constipation Likely secondary to Oxycodone use. Currently managed with Miralax. -Continue Miralax as needed for constipation.  Muscle Spasms Patient has Tizanidine available but does not take it regularly. -Continue Tizanidine as needed for muscle spasms. -Patient to notify provider when refill is needed.  Weight Loss Patient has lost weight recently, possibly due to decreased  appetite secondary to chemotherapy and Parkinson's disease. -Monitor weight closely. -Consider appetite stimulant if weight continues to decrease.  Parkinson's Disease Patient reports loss of taste, possibly contributing to decreased appetite and weight loss. -Continue current management.  Cancer-related Fatigue Patient reports fatigue and decreased endurance, possibly secondary to chemotherapy. -Administer IV fluids today in the infusion room. Chemo has been cancelled for today per Dr. Mosetta Putt.   Follow-up -Next appointment on 10/21/2023. -Patient to contact provider if any changes or concerns arise before next appointment.  Patient expressed understanding and was in agreement with this plan. He also understands that He can call the clinic at any time with any questions, concerns, or complaints.   Thank you for your referral and allowing Palliative to assist in [name] [name] [facility] care.   Number and complexity of problems addressed: HIGH - 1 or more chronic illnesses with SEVERE exacerbation, progression, or side effects of treatment - advanced cancer, pain. Any controlled substances utilized were prescribed in the context of palliative care.   Visit consisted of counseling and education dealing with the complex and emotionally intense issues of symptom management and palliative care in the setting of serious and potentially life-threatening illness.  Signed by: Willette Alma, AGPCNP-BC Palliative Medicine Team/[facility]

## 2023-10-01 ENCOUNTER — Ambulatory Visit (HOSPITAL_COMMUNITY)
Admission: RE | Admit: 2023-10-01 | Discharge: 2023-10-01 | Disposition: A | Discharge status: Home / Self Care | Payer: Medicare Other | Source: Ambulatory Visit | Attending: Hematology

## 2023-10-01 DIAGNOSIS — C7800 Secondary malignant neoplasm of unspecified lung: Secondary | ICD-10-CM | POA: Diagnosis not present

## 2023-10-01 DIAGNOSIS — I700 Atherosclerosis of aorta: Secondary | ICD-10-CM | POA: Diagnosis not present

## 2023-10-01 DIAGNOSIS — K8689 Other specified diseases of pancreas: Secondary | ICD-10-CM | POA: Diagnosis not present

## 2023-10-01 DIAGNOSIS — C250 Malignant neoplasm of head of pancreas: Secondary | ICD-10-CM | POA: Diagnosis not present

## 2023-10-01 DIAGNOSIS — C259 Malignant neoplasm of pancreas, unspecified: Secondary | ICD-10-CM | POA: Diagnosis not present

## 2023-10-01 MED ORDER — IOHEXOL 300 MG/ML  SOLN
100.0000 mL | Freq: Once | INTRAMUSCULAR | Status: AC | PRN
Start: 2023-10-01 — End: 2023-10-01
  Administered 2023-10-01: 100 mL via INTRAVENOUS

## 2023-10-06 NOTE — Assessment & Plan Note (Signed)
diagnosed in 09/2021, deemed poor surgical candidate due to his age and medical co-morbidities -S/p single agent gemcitabine 01/13/22 - 04/01/22 and 5 fx SBRT [name] [name]) 05/04/22 - 05/14/22. CA 19-9 decreased after treatment -Surveillance CT 11/09/2022 showed slightly decreased size of pancreatic head mass, no evidence of metastatic disease.  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  He is taking ibuprofen as needed.  No other clinical concern for cancer progression. -Lab reviewed, overall stable, tumor marker still pending. -He was admitted for right femoral neck fracture  on 02/03/23 after a fall and had surgery  -CT scan from May 17, 2023 showed multiple lung nodules, with dominant 1.5 cm nodule in the right middle lobe, highly concerning for metastasis.  His lung lesions are positive on PET scan. -Bronchoscopy and biopsy attempted, which showed atypical cells. -He restarted chemo gemcitabine and abraxane on 07/29/2023. -restaging CT 10/01/2023 showed stable disease

## 2023-10-07 ENCOUNTER — Encounter: Payer: Self-pay | Admitting: Nurse Practitioner

## 2023-10-07 ENCOUNTER — Inpatient Hospital Stay: Payer: Medicare Other

## 2023-10-07 ENCOUNTER — Encounter: Payer: Self-pay | Admitting: Hematology

## 2023-10-07 ENCOUNTER — Inpatient Hospital Stay (HOSPITAL_BASED_OUTPATIENT_CLINIC_OR_DEPARTMENT_OTHER): Payer: Medicare Other | Admitting: Hematology

## 2023-10-07 ENCOUNTER — Ambulatory Visit: Payer: Medicare Other | Admitting: Cardiology

## 2023-10-07 ENCOUNTER — Inpatient Hospital Stay (HOSPITAL_BASED_OUTPATIENT_CLINIC_OR_DEPARTMENT_OTHER): Payer: Medicare Other | Admitting: Nurse Practitioner

## 2023-10-07 VITALS — BP 102/80 | HR 65 | Resp 18

## 2023-10-07 VITALS — BP 86/76 | HR 78 | Temp 98.1°F | Resp 16 | Wt 139.9 lb

## 2023-10-07 DIAGNOSIS — Z95828 Presence of other vascular implants and grafts: Secondary | ICD-10-CM

## 2023-10-07 DIAGNOSIS — G893 Neoplasm related pain (acute) (chronic): Secondary | ICD-10-CM | POA: Diagnosis not present

## 2023-10-07 DIAGNOSIS — Z515 Encounter for palliative care: Secondary | ICD-10-CM

## 2023-10-07 DIAGNOSIS — C61 Malignant neoplasm of prostate: Secondary | ICD-10-CM | POA: Diagnosis not present

## 2023-10-07 DIAGNOSIS — R630 Anorexia: Secondary | ICD-10-CM | POA: Diagnosis not present

## 2023-10-07 DIAGNOSIS — C250 Malignant neoplasm of head of pancreas: Secondary | ICD-10-CM | POA: Diagnosis not present

## 2023-10-07 DIAGNOSIS — R530 Neoplastic (malignant) related fatigue: Secondary | ICD-10-CM | POA: Diagnosis not present

## 2023-10-07 DIAGNOSIS — R634 Abnormal weight loss: Secondary | ICD-10-CM

## 2023-10-07 DIAGNOSIS — C7800 Secondary malignant neoplasm of unspecified lung: Secondary | ICD-10-CM | POA: Diagnosis not present

## 2023-10-07 DIAGNOSIS — C7951 Secondary malignant neoplasm of bone: Secondary | ICD-10-CM | POA: Diagnosis not present

## 2023-10-07 DIAGNOSIS — M8088XA Other osteoporosis with current pathological fracture, vertebra(e), initial encounter for fracture: Secondary | ICD-10-CM

## 2023-10-07 DIAGNOSIS — M545 Low back pain, unspecified: Secondary | ICD-10-CM | POA: Diagnosis not present

## 2023-10-07 DIAGNOSIS — Z5111 Encounter for antineoplastic chemotherapy: Secondary | ICD-10-CM | POA: Diagnosis not present

## 2023-10-07 DIAGNOSIS — C259 Malignant neoplasm of pancreas, unspecified: Secondary | ICD-10-CM | POA: Diagnosis not present

## 2023-10-07 LAB — CMP (CANCER CENTER ONLY)
ALT: 5 U/L (ref 0–44)
AST: 32 U/L (ref 15–41)
Albumin: 3.6 g/dL (ref 3.5–5.0)
Alkaline Phosphatase: 73 U/L (ref 38–126)
Anion gap: 5 (ref 5–15)
BUN: 19 mg/dL (ref 8–23)
CO2: 27 mmol/L (ref 22–32)
Calcium: 9.3 mg/dL (ref 8.9–10.3)
Chloride: 106 mmol/L (ref 98–111)
Creatinine: 1.19 mg/dL (ref 0.61–1.24)
GFR, Estimated: >60 mL/min (ref >60)
Glucose, Bld: 159 mg/dL — ABNORMAL HIGH (ref 70–99)
Potassium: 4.3 mmol/L (ref 3.5–5.1)
Sodium: 138 mmol/L (ref 135–145)
Total Bilirubin: 0.8 mg/dL (ref 0.0–1.2)
Total Protein: 6.5 g/dL (ref 6.5–8.1)

## 2023-10-07 LAB — CBC WITH DIFFERENTIAL (CANCER CENTER ONLY)
Abs Immature Granulocytes: 0.03 10*3/uL (ref 0.00–0.07)
Basophils Absolute: 0.1 10*3/uL (ref 0.0–0.1)
Basophils Relative: 1 %
Eosinophils Absolute: 0.3 10*3/uL (ref 0.0–0.5)
Eosinophils Relative: 8 %
HCT: 31.6 % — ABNORMAL LOW (ref 39.0–52.0)
Hemoglobin: 10 g/dL — ABNORMAL LOW (ref 13.0–17.0)
Immature Granulocytes: 1 %
Lymphocytes Relative: 10 %
Lymphs Abs: 0.5 10*3/uL — ABNORMAL LOW (ref 0.7–4.0)
MCH: 31.3 pg (ref 26.0–34.0)
MCHC: 31.6 g/dL (ref 30.0–36.0)
MCV: 99.1 fL (ref 80.0–100.0)
Monocytes Absolute: 0.8 10*3/uL (ref 0.1–1.0)
Monocytes Relative: 18 %
Neutro Abs: 2.8 10*3/uL (ref 1.7–7.7)
Neutrophils Relative %: 62 %
Platelet Count: 317 10*3/uL (ref 150–400)
RBC: 3.19 MIL/uL — ABNORMAL LOW (ref 4.22–5.81)
RDW: 16.8 % — ABNORMAL HIGH (ref 11.5–15.5)
WBC Count: 4.5 10*3/uL (ref 4.0–10.5)
nRBC: 0 % (ref 0.0–0.2)

## 2023-10-07 MED ORDER — SODIUM CHLORIDE 0.9% FLUSH
10.0000 mL | Freq: Once | INTRAVENOUS | Status: AC
  Administered 2023-10-07: 10 mL

## 2023-10-07 MED ORDER — SODIUM CHLORIDE 0.9 % IV SOLN: 800.0000 mg/m2 | Freq: Once | INTRAVENOUS | Status: DC

## 2023-10-07 MED ORDER — ZOLEDRONIC ACID 5 MG/100ML IV SOLN: 5.0000 mg | Freq: Once | INTRAVENOUS | Status: DC

## 2023-10-07 MED ORDER — LEUPROLIDE ACETATE (6 MONTH) 45 MG ~~LOC~~ KIT: 45.0000 mg | PACK | Freq: Once | SUBCUTANEOUS | Status: DC

## 2023-10-07 MED ORDER — SODIUM CHLORIDE 0.9 % IV SOLN: INTRAVENOUS | Status: DC

## 2023-10-07 MED ORDER — SODIUM CHLORIDE 0.9 % IV SOLN: Freq: Once | INTRAVENOUS | Status: DC

## 2023-10-07 MED ORDER — OXYCODONE HCL 5 MG PO TABS: 5.0000 mg | ORAL_TABLET | ORAL | 0 refills | Status: DC | PRN

## 2023-10-07 MED ORDER — HEPARIN SOD (PORK) LOCK FLUSH 100 UNIT/ML IV SOLN
500.0000 [IU] | Freq: Once | INTRAVENOUS | Status: AC | PRN
  Administered 2023-10-07: 500 [IU]

## 2023-10-07 MED ORDER — PROCHLORPERAZINE MALEATE 10 MG PO TABS: 10.0000 mg | ORAL_TABLET | Freq: Once | ORAL | Status: DC

## 2023-10-07 MED ORDER — SODIUM CHLORIDE 0.9% FLUSH
10.0000 mL | INTRAVENOUS | Status: DC | PRN
  Administered 2023-10-07: 10 mL

## 2023-10-07 MED ORDER — SODIUM CHLORIDE 0.9 % IV SOLN: Freq: Once | INTRAVENOUS | Status: AC

## 2023-10-07 NOTE — Progress Notes (Signed)
Feng MD, came to bedside to assess pt post IVF due to elevated HR. Per MD ok to discharge pt. Pt had not taken Cardizem dose this morning due to low BP.

## 2023-10-07 NOTE — Progress Notes (Signed)
[facility]   [phone] Fax:([phone]) [phone]   Clinic Follow up Note   Patient Care Team: Lorenda Ishihara, MD as PCP - General (Internal Medicine) Tessa Lerner, DO as PCP - Cardiology (Cardiology) Tat, Octaviano Batty, DO as Consulting Physician (Neurology) Earna Coder, MD as Consulting Physician (Hematology and Oncology) Fritzi Mandes, MD as Consulting Physician (General Surgery) Malachy Mood, MD as Consulting Physician (Hematology and Oncology) Josephine Igo, DO as Consulting Physician (Pulmonary Disease)  Date of Service:  10/07/2023  CHIEF COMPLAINT: f/u of pancreatic cancer  CURRENT THERAPY:  Chemotherapy Abraxane and gemcitabine, will change to gemcitabine alone every 2 weeks  Oncology History   Pancreatic cancer [facility]) diagnosed in 09/2021, deemed poor surgical candidate due to his age and medical co-morbidities -S/p single agent gemcitabine 01/13/22 - [name]/26/23 and 5 fx SBRT [name]) 05/04/22 - 9/[name]/23. CA 19-9 decreased after treatment -Surveillance CT 11/09/2022 showed slightly decreased size of pancreatic head mass, no evidence of metastatic disease.  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  He is taking ibuprofen as needed.  No other clinical concern for cancer progression. -Lab reviewed, overall stable, tumor marker still pending. -He was admitted for right femoral neck fracture  on 02/03/23 after a fall and had surgery  -CT scan from May 17, 2023 showed multiple lung nodules, with dominant 1.5 cm nodule in the right middle lobe, highly concerning for metastasis.  His lung lesions are positive on PET scan. -Bronchoscopy and biopsy attempted, which showed atypical cells. -He restarted chemo gemcitabine and abraxane on 07/29/2023. -restaging CT 10/01/2023 showed stable disease      Assessment and Plan    Pancreatic Cancer with Pulmonary Metastasis 83 year [name] with  well-managed pancreatic cancer and pulmonary metastases. Recent CT shows no new lesions and some improvement. Completed five cycles of gemcitabine and Abraxane, experiencing significant fatigue and orthostatic hypotension. Discussed reducing chemotherapy to gemcitabine only for better tolerance and potential fatigue reduction.  Regular monitoring of CA-19-9 levels and blood pressure discussed. - Reduce chemotherapy to gemcitabine only - Administer chemotherapy biweekly - Administer IV fluids today and consider for next two treatments - Monitor blood pressure at home, especially if systolic BP <100 mmHg he will call me for IVF  - Order CA-19-9 blood test every visit - Refer to physical therapy at living facility  Fatigue Significant fatigue likely due to chemotherapy, affecting endurance but not daily activities. Reducing chemotherapy to gemcitabine only discussed to alleviate fatigue. - Reduce chemotherapy to gemcitabine only - Administer chemotherapy biweekly - Refer to physical therapy at living facility  Orthostatic Hypotension Orthostatic hypotension likely due to dehydration. Systolic BP 91 mmHg when sitting. Discussed importance of hydration and home BP monitoring. - Administer IV fluids today - Encourage fluid intake of 40-60 ounces daily - Monitor blood pressure at home, especially if systolic BP <100 mmHg - Instruct to come in for IV fluids if systolic BP <90 mmHg  General Health Maintenance Maintaining weight with some recent loss. Drinking Ensure Boost daily. Discussed importance of weight monitoring and adequate nutrition. - Encourage continued use of Ensure Boost - Monitor weight regularly  Plan -CT scan reviewed with patient and his children, overall stable disease. 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 Sclerotic 1.5 cm left ischial lesion/ Sclerotic medial left iliac bone 1.5 cm lesion (series 2/image 47), increased from 1.0 cm. Gleason score of 4+5= 9; with almost all cores involved greater than 80%.  9/16 Lupron 56-month depot on 9/16. [Urology; Dr.Siniski]  # MID OCT 2020- Zytiga 1000 mg+ prednisone; stopped December 2021 [poor tolerance if with RVR]; DISCONTINUED.   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       Prostate cancer metastatic to bone (HCC)  05/24/2019 Initial Diagnosis   Prostate cancer metastatic to bone [facility])   Pancreatic cancer (HCC)  10/02/2021 Imaging   CT ABDOMEN PELVIS W CONTRAST   IMPRESSION: 1. There is new pancreatic ductal dilation with an indeterminate 19 mm hypodense low-density mass area in the head/uncinate process of the pancreas. Recommend further evaluation with dedicated MRI/MRCP with contrast. 2. No evidence of bowel obstruction.  Moderate rectal stool ball. 3. Scattered peripherally located clustered pulmonary nodules in the RIGHT middle lobe. Findings are likely infectious or inflammatory in etiology. Given history of malignancy, recommend follow-up as per clinical protocol.   10/28/2021 Imaging   MR ABDOMEN MRCP W WO CONTAST   IMPRESSION: 1. Examination is significantly limited by breath motion artifact throughout. 2. The main pancreatic duct is diffusely  dilated from the level of the superior pancreatic head, measuring up to 0.[name] cm. 3. In the inferior pancreatic head and uncinate, there is a multilobulated, fluid signal cystic lesion measuring 1.8 x 1.0 cm. Due to breath motion artifact it is difficult to determine whether this communicates to the adjacent duct. There are multiple additional subcentimeter cystic lesions scattered throughout the pancreas, several of which clearly communicate to the main pancreatic duct. 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 Recommend EUS/FNA for further diagnosis given the presence of pancreatic ductal dilatation. 4. Status post left nephrectomy. 5. No evidence of recurrent or metastatic disease in the abdomen.   12/25/2021 Procedure   UPPER ENDOSCOPIC ULTRASOUND-By Dr. Meridee Score  - A mass-like region was identified in the pancreatic head where the pancreatic duct dilates with multiple cystic regions noted throughout the pancreas as well. 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 However, the endosonographic appearance is suspicious for potential adenocarcinoma. This was staged T2 N0 Mx by endosonographic criteria. T - No malignant-appearing lymph nodes were visualized in the celiac region (level 20), peripancreatic region and porta hepatis region.   12/25/2021 Pathology Results   CYTOLOGY - NON PAP  CASE: [id number]   FINAL MICROSCOPIC DIAGNOSIS:  A. PANCREAS, HEAD, FINE NEEDLE ASPIRATION:  - Malignant cells consistent with adenocarcinoma     01/01/2022 Initial Diagnosis   Pancreatic cancer (HCC)   01/13/2022 - [name]/26/2023 Chemotherapy   Patient is on Treatment Plan : PANCREAS Gemcitabine D1,15 q28d x 4 Cycles     01/26/2022 Cancer Staging   Staging form: Exocrine Pancreas, AJCC 8th Edition - Clinical: Stage IB (cT2, cN0, cM0) -  Signed by Malachy Mood, MD on 01/26/2022 Total positive nodes: 0    Genetic Testing   [facility]  CancerNext-Expanded Panel was Negative. Of note, a variant of uncertain significance was identified in the BLM gene (p.[id number]) and GALNT12 gene ([id number]). Report date is 01/26/2022.  The CancerNext-Expanded gene panel offered by [facility] and includes sequencing, rearrangement, and RNA analysis for the following 77 genes: AIP, ALK, APC, ATM, AXIN2, BAP1, BARD1, BLM, BMPR1A, BRCA1, BRCA2, BRIP1, CDC73, CDH1, CDK4, CDKN1B, CDKN2A, CHEK2, CTNNA1, DICER1, FANCC, FH, FLCN, GALNT12, KIF1B, LZTR1, MAX, MEN1, MET, MLH1, MSH2, MSH3, MSH6, MUTYH, NBN, NF1, NF2, NTHL1, PALB2, PHOX2B, PMS2, POT1, PRKAR1A, PTCH1, PTEN, RAD51C, RAD51D, RB1, RECQL, RET, SDHA, SDHAF2, SDHB, SDHC, SDHD, SMAD4, SMARCA4, SMARCB1, SMARCE1, STK11, SUFU, TMEM127, TP53, TSC1, TSC2, VHL and XRCC2 (sequencing and deletion/duplication); EGFR, EGLN1, HOXB13, KIT, MITF, PDGFRA, POLD1, and POLE (sequencing only); EPCAM and GREM1 (deletion/duplication only).    08/12/2022 Imaging    IMPRESSION: 1. Slight interval increase in size of the pancreatic head-uncinate process mass with increasing direct contact of the SMA and portal vein as discussed. Also with a replaced RIGHT hepatic artery which passes through the tumor. 2. No signs of acute inflammation currently about the pancreas. With similar appearance of ductal obstruction and peripheral atrophy of pancreas due to the tumor. 3. Post LEFT nephrectomy. 4. Sclerotic bony lesions of the LEFT ischium and LEFT iliac bone similar to prior imaging. 5. Mild hyperenhancement of LEFT prostate 14 mm with asymmetry, nonspecific. This is unchanged in this patient with history of prostate neoplasm. 6. Aortic atherosclerosis.   07/29/2023 -  Chemotherapy   Patient is on Treatment Plan : PANCREATIC Abraxane D1,8,15 + Gemcitabine D1,8,15 q28d     08/03/2023 Imaging   CT chest abdomen pelvis with contrast IMPRESSION: 1. No substantial change in size of heterogeneously enhancing pancreatic  head mass with adjacent fiducials. 2. Slightly increased main pancreatic ductal dilation and mild intrahepatic bile duct dilation. 3. No evidence of new or progressive metastatic disease in the abdomen or pelvis. 4. Similar mildly sclerotic focus in the left ischial tuberosity dating back to 01/07/2019. A left iliac bone faintly sclerotic lesion measuring 1.4 cm has decreased in density since 2020, likely treated osseous metastasis. 5. Left lower lobe nodules measuring up to 5 mm are unchanged. 6. Aortic Atherosclerosis (ICD10-I70.0).        Discussed the use of AI scribe software for clinical note transcription with the patient, who gave verbal consent to proceed.  History of Present Illness   The patient, an [name] [name] with a known diagnosis of pancreatic cancer, presents for a follow-up visit. He reports experiencing significant fatigue and weakness, which he attributes to his ongoing chemotherapy treatment. He notes a decrease in his endurance level and shortness of breath. Despite these symptoms, the patient expresses a desire to continue with the current treatment plan. He also reports some weight loss since the last visit. The patient lives in an independent living facility and uses a walker for mobility. He is able to take care of himself for the most part, but has been receiving some help. He participates in activities at his living facility and expresses interest in physical therapy. The patient also reports taking oxycodone for pain management and has been consuming more fluids to manage bowel movements.         All other systems were reviewed with the patient and are negative.  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)     Prostate cancer metastatic to bone (HCC)    REM sleep behavior disorder    Sleep apnea    BiPap - uses nightly    SURGICAL HISTORY: Past Surgical History:  Procedure Laterality Date   ANTERIOR APPROACH HEMI HIP ARTHROPLASTY Right 02/05/2023   Procedure: ANTERIOR APPROACH HEMI HIP ARTHROPLASTY;  Surgeon: Jodi Geralds, MD;  Location: [facility] OR;  Service: Orthopedics;  Laterality: Right;   APPENDECTOMY  1963   BIOPSY  12/25/2021   Procedure: BIOPSY;  Surgeon: Meridee Score Netty Starring., MD;  Location: [facility] ENDOSCOPY;  Service: Gastroenterology;;   BRONCHIAL BIOPSY  07/05/2023   Procedure: BRONCHIAL BIOPSIES;  Surgeon: Josephine Igo, DO;  Location: [facility] ENDOSCOPY;  Service: Pulmonary;;   COLONOSCOPY  2010, 2015   ESOPHAGOGASTRODUODENOSCOPY N/A 04/16/2022   Procedure: ESOPHAGOGASTRODUODENOSCOPY (EGD);  Surgeon: Lemar Lofty., MD;  Location: [facility] ENDOSCOPY;  Service: Gastroenterology;  Laterality: N/A;   ESOPHAGOGASTRODUODENOSCOPY (EGD) WITH PROPOFOL N/A 12/25/2021   Procedure: ESOPHAGOGASTRODUODENOSCOPY (EGD) WITH PROPOFOL;  Surgeon: Meridee Score Netty Starring., MD;  Location: [facility] ENDOSCOPY;  Service: Gastroenterology;  Laterality: N/A;   EUS N/A 12/25/2021   Procedure: UPPER ENDOSCOPIC ULTRASOUND (EUS) RADIAL;  Surgeon: Lemar Lofty., MD;  Location[facility] END[facility]COPY;  Service: Gastroenterology;  Laterality: N/A;   EUS N/A 04/16/2022   Procedure: UPPER ENDOSCOPIC ULTRASOUND (EUS) RADIAL;  Surgeon: Lemar Lofty., MD;  Location: [facility] ENDOSCOPY;  Service: Gastroenterology;  Laterality: N/A;   FIDUCIAL MARKER PLACEMENT N/A 04/16/2022   Procedure: FIDUCIAL MARKER PLACEMENT;  Surgeon: Lemar Lofty., MD;  Location: [facility] ENDOSCOPY;  Service: Gastroenterology;  Laterality: N/A;   FINE NEEDLE ASPIRATION  12/25/2021   Procedure: FINE NEEDLE ASPIRATION (FNA) LINEAR;  Surgeon: Lemar Lofty., MD;  Location: [facility] ENDOSCOPY;  Service: Gastroenterology;;   IR IMAGING GUIDED PORT INSERTION   07/28/2023   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   KYPHOPLASTY N/A 08/15/2020   Procedure: L2 compression fracture;  Surgeon: Kennedy Bucker, MD;  Location: [facility] ORS;  Service: Orthopedics;  Laterality: N/A;   KYPHOPLASTY N/A 08/29/2020   Procedure: T8 KYPHOPLASTY;  Surgeon: Kennedy Bucker, MD;  Location: [facility] ORS;  Service: Orthopedics;  Laterality: N/A;   KYPHOPLASTY N/A 11/12/2020   Procedure: L1 KYPHOPLASTY;  Surgeon: Kennedy Bucker, MD;  Location: [facility] ORS;  Service: Orthopedics;  Laterality: N/A;   PORTACATH PLACEMENT N/A 02/05/2022   Procedure: INSERTION PORT-A-CATH;  Surgeon: Fritzi Mandes, MD;  Location: [facility] ORS;  Service: General;  Laterality: N/A;    I have reviewed the social history and family history with the patient and they are unchanged from previous note.  ALLERGIES:  is allergic to influenza vaccines and influenza virus vaccine.  MEDICATIONS:  Current Outpatient Medications  Medication Sig Dispense Refill   acetaminophen (TYLENOL) 500 MG tablet Take 1 tablet (500 mg total) by mouth every 6 (six) hours as needed for moderate pain or mild pain. (Patient taking differently: Take 500 mg by mouth as needed for moderate pain (pain score 4-6) or mild pain (pain score 1-3).) 30 tablet 0   ammonium lactate (LAC-HYDRIN) 12 % lotion Apply 1 Application topically as needed.     bisacodyl (DULCOLAX) 10 MG suppository Place 1 suppository (10 mg total) rectally daily as needed for moderate constipation. 12 suppository 0   carbidopa-levodopa (SINEMET  CR) 50-200 MG tablet Take 1 tablet by mouth at bedtime. 90 tablet 0   carbidopa-levodopa (SINEMET IR) 25-100 MG tablet Take 3 tablets at [name] AM, 2 tablets at 11 AM, 3 tablets at 3 PM, 2 tablets at [name] PM 900 tablet 0   ciclopirox (PENLAC) 8 % solution Apply 1 Application topically at bedtime.     clonazePAM (KLONOPIN) 0.5 MG tablet Take 1 tablet (0.5 mg total) by mouth at bedtime. 5 tablet 0   cyanocobalamin (VITAMIN B12) 1000 MCG tablet Take 1,000 mcg by  mouth daily.     diltiazem (CARDIZEM) 30 MG tablet Take 1 tablet (30 mg total) by mouth 2 (two) times daily. Do not take if systolic blood pressure (top number) is less than 100, or if pulse if less than 55. 90 tablet 3   ELIQUIS 5 MG TABS tablet TAKE 1 TABLET TWICE A DAY 120 tablet 5   Glycerin-Hypromellose-PEG 400 (VISINE DRY EYE OP) Place 1 drop into both eyes 2 (two) times daily as needed (eye irritation).      ketoconazole (NIZORAL) 2 % shampoo Apply 1 application  topically 3 (three) times a week.     leuprolide, 6 Month, (ELIGARD) 45 MG injection Inject 45 mg into the skin every 6 (six) months.     lidocaine-prilocaine (EMLA) cream Apply to port site 1 to 2 hours prior to appointments/treatment 30 g 3   omeprazole (PRILOSEC) 40 MG capsule Take 1 capsule (40 mg total) by mouth daily. 30 capsule 3   ondansetron (ZOFRAN) 4 MG tablet Take 1 tablet (4 mg total) by mouth every 6 (six) hours as needed for nausea. 30 tablet 3   oxyCODONE (OXY IR/ROXICODONE) 5 MG immediate release tablet Take 1 tablet (5 mg total) by mouth every 6 (six) hours as needed for severe pain (pain score [name]-10). 90 tablet 0   PANCREAZE 37000-97300 units CPEP TAKE 2 CAPSULES WITH BREAKFAST, LUNCH, AND EVENING MEAL (WITH EACH MEAL) AND 1 CAPSULE WITH SNACK 600 capsule 5   polyethylene glycol powder (MIRALAX) 17 GM/SCOOP powder Take 17 g by mouth in the morning, at noon, and at bedtime.     prochlorperazine (COMPAZINE) 10 MG tablet Take 1 tablet (10 mg total) by mouth every 6 (six) hours as needed for nausea or vomiting. 30 tablet 2   rosuvastatin (CRESTOR) 10 MG tablet TAKE 1 TABLET DAILY 90 tablet 1   tiZANidine (ZANAFLEX) 2 MG tablet Take 1 tablet (2 mg total) by mouth every 8 (eight) hours as needed for muscle spasms. 10 tablet 0   triamcinolone cream (KENALOG) 0.1 % Apply 1 application topically 2 (two) times daily. (Patient taking differently: Apply 1 application  topically 2 (two) times daily as needed (itching).) 453.6 g 1    zoledronic acid (RECLAST) 5 MG/100ML SOLN injection Inject 5 mg into the vein See admin instructions. Once a year     No current facility-administered medications for this visit.    PHYSICAL EXAMINATION: ECOG PERFORMANCE STATUS: 2 - Symptomatic, <50% confined to bed  Vitals:   10/07/23 0958 10/07/23 1000  BP: 102/83 (!) 86/76  Pulse:    Resp:    Temp:    SpO2:     Wt Readings from Last 3 Encounters:  10/07/23 139 lb 14.4 oz (63.5 kg)  09/24/23 145 lb 3.2 oz (65.9 kg)  09/23/23 145 lb (65.8 kg)     GENERAL:alert, no distress and comfortable SKIN: skin color, texture, turgor are normal, no rashes or significant lesions EYES: normal,  Conjunctiva are pink and non-injected, sclera clear NECK: supple, thyroid normal size, non-tender, without nodularity LYMPH:  no palpable lymphadenopathy in the cervical, axillary  LUNGS: clear to auscultation and percussion with normal breathing effort HEART: regular rate & rhythm and no murmurs and no lower extremity edema ABDOMEN:abdomen soft, non-tender and normal bowel sounds Musculoskeletal:no cyanosis of digits and no clubbing  NEURO: alert & oriented x 3 with fluent speech, no focal motor/sensory deficits   LABORATORY DATA:  I have reviewed the data as listed    Latest Ref Rng & Units 10/07/2023    9:26 AM 09/23/2023    9:57 AM 09/10/2023    [name]:49 AM  CBC  WBC 4.0 - 10.5 K/uL 4.5  [name].6  4.0   Hemoglobin 13.0 - 17.0 g/dL 16.1  09.6  04.5   Hematocrit 39.0 - 52.0 % 31.6  32.3  32.5   Platelets 150 - 400 K/uL 317  180  205         Latest Ref Rng & Units 10/07/2023    9:26 AM 09/23/2023    9:57 AM 09/10/2023    [name]:49 AM  CMP  Glucose 70 - 99 mg/dL 409  811  914   BUN 8 - 23 mg/dL 19  19  18    Creatinine 0.61 - 1.24 mg/dL [name].82  9.56  2.13   Sodium 135 - 145 mmol/L 138  141  141   Potassium 3.5 - 5.1 mmol/L 4.3  4.3  4.0   Chloride 98 - 111 mmol/L 106  108  109   CO2 22 - 32 mmol/L 27  28  27    Calcium 8.9 - 10.3 mg/dL 9.3  9.6  9.2    Total Protein 6.5 - 8.1 g/dL 6.5  6.5  6.5   Total Bilirubin 0.0 - 1.2 mg/dL 0.8  0.[name]  0.[name]   Alkaline Phos 38 - 126 U/L 73  61  60   AST 15 - 41 U/L 32  24  26   ALT 0 - 44 U/L 5  <5  5       RADIOGRAPHIC STUDIES: I have personally reviewed the radiological images as listed and agreed with the findings in the report. No results found.    Orders Placed This Encounter  Procedures   Cancer antigen 19-9    Standing Status:   Standing    Number of Occurrences:   50    Expiration Date:   10/06/2024   CBC with Differential (Cancer Center Only)    Standing Status:   Future    Expected Date:   10/21/2023    Expiration Date:   10/20/2024   CMP (Cancer Center only)    Standing Status:   Future    Expected Date:   10/21/2023    Expiration Date:   10/20/2024   CBC with Differential (Cancer Center Only)    Standing Status:   Future    Expected Date:   12/02/2023    Expiration Date:   12/01/2024   CMP (Cancer Center only)    Standing Status:   Future    Expected Date:   12/02/2023    Expiration Date:   12/01/2024   CBC with Differential (Cancer Center Only)    Standing Status:   Future    Expected Date:   12/16/2023    Expiration Date:   12/15/2024   CMP (Cancer Center only)    Standing Status:   Future    Expected Date:   12/16/2023  Expiration Date:   12/15/2024   CBC with Differential (Cancer Center Only)    Standing Status:   Future    Expected Date:   12/30/2023    Expiration Date:   12/29/2024   CMP (Cancer Center only)    Standing Status:   Future    Expected Date:   12/30/2023    Expiration Date:   12/29/2024   CBC with Differential (Cancer Center Only)    Standing Status:   Future    Expected Date:   01/13/2024    Expiration Date:   01/12/2025   CMP (Cancer Center only)    Standing Status:   Future    Expected Date:   01/13/2024    Expiration Date:   01/12/2025   All questions were answered. The patient knows to call the clinic with any problems, questions or concerns. No barriers  to learning was detected. The total time spent in the appointment was 30 minutes.     Malachy Mood, MD 10/07/2023

## 2023-10-07 NOTE — Progress Notes (Signed)
Per Dr. Mosetta Putt, holding chemotherapy today d/t clinic assessment and pt's orthostatic VS abnormal.  Pt will get 1L NS over 2 hrs today.  Notified Infusion Charge Nurse Idelle Jo and infusion assigned nurse Jule Ser via Secure Chat.  Secure Chat seen by both parties.

## 2023-10-07 NOTE — Patient Instructions (Signed)

## 2023-10-08 ENCOUNTER — Other Ambulatory Visit: Payer: Self-pay

## 2023-10-13 DIAGNOSIS — G4733 Obstructive sleep apnea (adult) (pediatric): Secondary | ICD-10-CM | POA: Diagnosis not present

## 2023-10-13 DIAGNOSIS — Z9989 Dependence on other enabling machines and devices: Secondary | ICD-10-CM | POA: Diagnosis not present

## 2023-10-13 DIAGNOSIS — G4752 REM sleep behavior disorder: Secondary | ICD-10-CM | POA: Diagnosis not present

## 2023-10-13 DIAGNOSIS — G20B1 Parkinson's disease with dyskinesia, without mention of fluctuations: Secondary | ICD-10-CM | POA: Diagnosis not present

## 2023-10-13 DIAGNOSIS — I482 Chronic atrial fibrillation, unspecified: Secondary | ICD-10-CM | POA: Diagnosis not present

## 2023-10-18 ENCOUNTER — Other Ambulatory Visit: Payer: Self-pay

## 2023-10-18 ENCOUNTER — Other Ambulatory Visit: Payer: Self-pay | Admitting: Neurology

## 2023-10-18 DIAGNOSIS — I4891 Unspecified atrial fibrillation: Secondary | ICD-10-CM

## 2023-10-18 DIAGNOSIS — G20A1 Parkinson's disease without dyskinesia, without mention of fluctuations: Secondary | ICD-10-CM

## 2023-10-18 MED ORDER — DILTIAZEM HCL 30 MG PO TABS
30.0000 mg | ORAL_TABLET | Freq: Two times a day (BID) | ORAL | 3 refills | Status: DC
Start: 2023-10-18 — End: 2023-11-25

## 2023-10-18 NOTE — Progress Notes (Deleted)
Palliative Medicine [facility]  [phone] Fax:([phone]) [phone]   Name: [name] [name] Date: 10/18/2023 MRN: [id number]  DOB: [birth year]  Patient Care Team: Lorenda Ishihara, MD as PCP - General (Internal Medicine) Tessa Lerner, DO as PCP - Cardiology (Cardiology) Tat, Octaviano Batty, DO as Consulting Physician (Neurology) Earna Coder, MD as Consulting Physician (Hematology and Oncology) Fritzi Mandes, MD as Consulting Physician (General Surgery) Malachy Mood, MD as Consulting Physician (Hematology and Oncology) Josephine Igo, DO as Consulting Physician (Pulmonary Disease) Pickenpack-Cousar, Arty Baumgartner, NP as Nurse Practitioner (Hospice and Palliative Medicine)    REASON FOR CONSULTATION: [name] [name] is a 83 y.o. 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 JohnnyHarl' history also includes  pancreatic cancer (09/2021) as well as parkinson's disease. Palliative ask to see for symptom management and goals of care.    SOCIAL HISTORY:     reports that he quit smoking about 13 years ago. His smoking use included cigars. He has never used smokeless tobacco. He reports that he does not currently use alcohol after a past usage of about 4.0 standard drinks of alcohol per week. He reports that he does not use drugs.  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)    Prostate cancer metastatic to bone (HCC)    REM sleep behavior disorder    Sleep apnea    BiPap - uses nightly    PAST SURGICAL HISTORY:  Past Surgical History:  Procedure Laterality  Date   ANTERIOR APPROACH HEMI HIP ARTHROPLASTY Right 02/05/2023   Procedure: ANTERIOR APPROACH HEMI HIP ARTHROPLASTY;  Surgeon: Jodi Geralds, MD;  Location: [facility] OR;  Service: Orthopedics;  Laterality: Right;   APPENDECTOMY  1963   BIOPSY  12/25/2021   Procedure: BIOPSY;  Surgeon: Meridee Score Netty Starring., MD;  Location: [facility] ENDOSCOPY;  Service: Gastroenterology;;   BRONCHIAL BIOPSY  07/05/2023   Procedure: BRONCHIAL BIOPSIES;  Surgeon: Josephine Igo, DO;  Location: [facility] ENDOSCOPY;  Service: Pulmonary;;   COLONOSCOPY  2010, 2015   ESOPHAGOGASTRODUODENOSCOPY N/A 04/16/2022   Procedure: ESOPHAGOGASTRODUODENOSCOPY (EGD);  Surgeon: Lemar Lofty., MD;  Location: [facility] ENDOSCOPY;  Service: Gastroenterology;  Laterality: N/A;   ESOPHAGOGASTRODUODENOSCOPY (EGD) WITH PROPOFOL N/A 12/25/2021   Procedure: ESOPHAGOGASTRODUODENOSCOPY (EGD) WITH PROPOFOL;  Surgeon: Meridee Score Netty Starring., MD;  Location: [facility] ENDOSCOPY;  Service: Gastroenterology;  Laterality: N/A;   EUS N/A 12/25/2021   Procedure: UPPER ENDOSCOPIC ULTRASOUND (EUS) RADIAL;  Surgeon: Lemar Lofty., MD;  Location: [facility] ENDOSCOPY;  Service: Gastroenterology;  Laterality: N/A;   EUS N/A 04/16/2022   Procedure: UPPER ENDOSCOPIC ULTRASOUND (EUS) RADIAL;  Surgeon: Lemar Lofty., MD;  Location: [facility] ENDOSCOPY;  Service: Gastroenterology;  Laterality: N/A;   FIDUCIAL MARKER PLACEMENT N/A 04/16/2022   Procedure: FIDUCIAL MARKER PLACEMENT;  Surgeon: Lemar Lofty., MD;  Location: [facility] ENDOSCOPY;  Service: Gastroenterology;  Laterality: N/A;   FINE NEEDLE ASPIRATION  12/25/2021   Procedure: FINE NEEDLE ASPIRATION (FNA) LINEAR;  Surgeon: Lemar Lofty., MD;  Location: [facility] ENDOSCOPY;  Service: Gastroenterology;;   IR IMAGING GUIDED PORT INSERTION  07/28/2023  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   KYPHOPLASTY N/A 08/15/2020   Procedure: L2 compression fracture;  Surgeon: Kennedy Bucker, MD;  Location: [facility] ORS;  Service: Orthopedics;   Laterality: N/A;   KYPHOPLASTY N/A 08/29/2020   Procedure: T8 KYPHOPLASTY;  Surgeon: Kennedy Bucker, MD;  Location: [facility] ORS;  Service: Orthopedics;  Laterality: N/A;   KYPHOPLASTY N/A 11/12/2020   Procedure: L1 KYPHOPLASTY;  Surgeon: Kennedy Bucker, MD;  Location: [facility] ORS;  Service: Orthopedics;  Laterality: N/A;   PORTACATH PLACEMENT N/A 02/05/2022   Procedure: INSERTION PORT-A-CATH;  Surgeon: Fritzi Mandes, MD;  Location: [facility] ORS;  Service: General;  Laterality: N/A;    HEMATOLOGY/ONCOLOGY HISTORY:  Oncology History Overview Note  # PROSTATE CANCER- METATSTATIC to BONE; PSA- 26.5. Sclerotic 1.5 cm left ischial lesion/ Sclerotic medial left iliac bone 1.5 cm lesion (series 2/image 47), increased from 1.0 cm. Gleason score of 4+5= 9; with almost all cores involved greater than 80%.  9/16 Lupron 2-month depot on 9/16. [Urology; Dr.Siniski]  # MID OCT 2020- Zytiga 1000 mg+ prednisone; stopped December 2021 [poor tolerance if with RVR]; DISCONTINUED.   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       Prostate cancer metastatic to bone (HCC)  05/24/2019 Initial Diagnosis   Prostate cancer metastatic to bone [facility])   Pancreatic cancer (HCC)  10/02/2021 Imaging   CT ABDOMEN PELVIS W CONTRAST   IMPRESSION: 1. There is new pancreatic ductal dilation with an indeterminate 19 mm hypodense low-density mass area in the head/uncinate process of the pancreas. Recommend further evaluation with dedicated MRI/MRCP with contrast. 2. No evidence of bowel obstruction.  Moderate rectal stool ball. 3. Scattered peripherally located clustered pulmonary nodules in the RIGHT middle lobe. Findings are likely infectious or inflammatory in etiology. Given history of malignancy, recommend follow-up as  per clinical protocol.   10/28/2021 Imaging   MR ABDOMEN MRCP W WO CONTAST   IMPRESSION: 1. Examination is significantly limited by breath motion artifact throughout. 2. The main pancreatic duct is diffusely dilated from the level of the superior pancreatic head, measuring up to 0.7 cm. 3. In the inferior pancreatic head and uncinate, there is a multilobulated, fluid signal cystic lesion measuring 1.8 x 1.0 cm. Due to breath motion artifact it is difficult to determine whether this communicates to the adjacent duct. There are multiple additional subcentimeter cystic lesions scattered throughout the pancreas, several of which clearly communicate to the main pancreatic duct. 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 Recommend EUS/FNA for further diagnosis given the presence of pancreatic ductal dilatation. 4. Status post left nephrectomy. 5. No evidence of recurrent or metastatic disease in the abdomen.   12/25/2021 Procedure   UPPER ENDOSCOPIC ULTRASOUND-By Dr. Meridee Score  - A mass-like region was identified in the pancreatic head where the pancreatic duct dilates with multiple cystic regions noted throughout the pancreas as well. 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 However, the endosonographic appearance is suspicious for potential adenocarcinoma. This was staged T2 N0 Mx by endosonographic criteria. T - No malignant-appearing lymph nodes were visualized in the celiac region (level 20), peripancreatic region and porta hepatis region.   12/25/2021 Pathology Results   CYTOLOGY - NON PAP  CASE: [id number]   FINAL MICROSCOPIC DIAGNOSIS:  A. PANCREAS, HEAD,  FINE NEEDLE ASPIRATION:  - Malignant cells consistent with adenocarcinoma     01/01/2022 Initial Diagnosis   Pancreatic cancer (HCC)   01/13/2022 - 04/01/2022 Chemotherapy   Patient is on Treatment Plan : PANCREAS Gemcitabine D1,15 q28d x 4 Cycles      01/26/2022 Cancer Staging   Staging form: Exocrine Pancreas, AJCC 8th Edition - Clinical: Stage IB (cT2, cN0, cM0) - Signed by Malachy Mood, MD on 01/26/2022 Total positive nodes: 0    Genetic Testing   [facility] CancerNext-Expanded Panel was Negative. Of note, a variant of uncertain significance was identified in the BLM gene (p.[id number]) and GALNT12 gene ([id number]). Report date is 01/26/2022.  The CancerNext-Expanded gene panel offered by [facility] and includes sequencing, rearrangement, and RNA analysis for the following 77 genes: AIP, ALK, APC, ATM, AXIN2, BAP1, BARD1, BLM, BMPR1A, BRCA1, BRCA2, BRIP1, CDC73, CDH1, CDK4, CDKN1B, CDKN2A, CHEK2, CTNNA1, DICER1, FANCC, FH, FLCN, GALNT12, KIF1B, LZTR1, MAX, MEN1, MET, MLH1, MSH2, MSH3, MSH6, MUTYH, NBN, NF1, NF2, NTHL1, PALB2, PHOX2B, PMS2, POT1, PRKAR1A, PTCH1, PTEN, RAD51C, RAD51D, RB1, RECQL, RET, SDHA, SDHAF2, SDHB, SDHC, SDHD, SMAD4, SMARCA4, SMARCB1, SMARCE1, STK11, SUFU, TMEM127, TP53, TSC1, TSC2, VHL and XRCC2 (sequencing and deletion/duplication); EGFR, EGLN1, HOXB13, KIT, MITF, PDGFRA, POLD1, and POLE (sequencing only); EPCAM and GREM1 (deletion/duplication only).    08/12/2022 Imaging    IMPRESSION: 1. Slight interval increase in size of the pancreatic head-uncinate process mass with increasing direct contact of the SMA and portal vein as discussed. Also with a replaced RIGHT hepatic artery which passes through the tumor. 2. No signs of acute inflammation currently about the pancreas. With similar appearance of ductal obstruction and peripheral atrophy of pancreas due to the tumor. 3. Post LEFT nephrectomy. 4. Sclerotic bony lesions of the LEFT ischium and LEFT iliac bone similar to prior imaging. 5. Mild hyperenhancement of LEFT prostate 14 mm with asymmetry, nonspecific. This is unchanged in this patient with history of prostate neoplasm. 6. Aortic atherosclerosis.   07/29/2023 -  Chemotherapy   Patient is on Treatment  Plan : PANCREATIC Abraxane D1,8,15 + Gemcitabine D1,8,15 q28d     08/03/2023 Imaging   CT chest abdomen pelvis with contrast IMPRESSION: 1. No substantial change in size of heterogeneously enhancing pancreatic head mass with adjacent fiducials. 2. Slightly increased main pancreatic ductal dilation and mild intrahepatic bile duct dilation. 3. No evidence of new or progressive metastatic disease in the abdomen or pelvis. 4. Similar mildly sclerotic focus in the left ischial tuberosity dating back to 01/07/2019. A left iliac bone faintly sclerotic lesion measuring 1.4 cm has decreased in density since 2020, likely treated osseous metastasis. 5. Left lower lobe nodules measuring up to 5 mm are unchanged. 6. Aortic Atherosclerosis (ICD10-I70.0).       ALLERGIES:  is allergic to influenza vaccines and influenza virus vaccine.  MEDICATIONS:  Current Outpatient Medications  Medication Sig Dispense Refill   acetaminophen (TYLENOL) 500 MG tablet Take 1 tablet (500 mg total) by mouth every 6 (six) hours as needed for moderate pain or mild pain. (Patient taking differently: Take 500 mg by mouth as needed for moderate pain (pain score 4-6) or mild pain (pain score 1-3).) 30 tablet 0   ammonium lactate (LAC-HYDRIN) 12 % lotion Apply 1 Application topically as needed.     bisacodyl (DULCOLAX) 10 MG suppository Place 1 suppository (10 mg total) rectally daily as needed for moderate constipation. 12 suppository 0   carbidopa-levodopa (SINEMET CR) 50-200 MG tablet Take 1 tablet by mouth at bedtime. 90  tablet 0   carbidopa-levodopa (SINEMET IR) 25-100 MG tablet Take 3 tablets at 7 AM, 2 tablets at 11 AM, 3 tablets at 3 PM, 2 tablets at 7 PM 900 tablet 0   ciclopirox (PENLAC) 8 % solution Apply 1 Application topically at bedtime.     clonazePAM (KLONOPIN) 0.5 MG tablet Take 1 tablet (0.5 mg total) by mouth at bedtime. 5 tablet 0   cyanocobalamin (VITAMIN B12) 1000 MCG tablet Take 1,000 mcg by mouth daily.      diltiazem (CARDIZEM) 30 MG tablet Take 1 tablet (30 mg total) by mouth 2 (two) times daily. Do not take if systolic blood pressure (top number) is less than 100, or if pulse if less than 55. 90 tablet 3   ELIQUIS 5 MG TABS tablet TAKE 1 TABLET TWICE A DAY 120 tablet 5   Glycerin-Hypromellose-PEG 400 (VISINE DRY EYE OP) Place 1 drop into both eyes 2 (two) times daily as needed (eye irritation).      ketoconazole (NIZORAL) 2 % shampoo Apply 1 application  topically 3 (three) times a week.     leuprolide, 6 Month, (ELIGARD) 45 MG injection Inject 45 mg into the skin every 6 (six) months.     lidocaine-prilocaine (EMLA) cream Apply to port site 1 to 2 hours prior to appointments/treatment 30 g 3   omeprazole (PRILOSEC) 40 MG capsule Take 1 capsule (40 mg total) by mouth daily. 30 capsule 3   ondansetron (ZOFRAN) 4 MG tablet Take 1 tablet (4 mg total) by mouth every 6 (six) hours as needed for nausea. 30 tablet 3   oxyCODONE (OXY IR/ROXICODONE) 5 MG immediate release tablet Take 1 tablet (5 mg total) by mouth every 4 (four) hours as needed for severe pain (pain score 7-10). 90 tablet 0   PANCREAZE 37000-97300 units CPEP TAKE 2 CAPSULES WITH BREAKFAST, LUNCH, AND EVENING MEAL (WITH EACH MEAL) AND 1 CAPSULE WITH SNACK 600 capsule 5   polyethylene glycol powder (MIRALAX) 17 GM/SCOOP powder Take 17 g by mouth in the morning, at noon, and at bedtime.     prochlorperazine (COMPAZINE) 10 MG tablet Take 1 tablet (10 mg total) by mouth every 6 (six) hours as needed for nausea or vomiting. 30 tablet 2   rosuvastatin (CRESTOR) 10 MG tablet TAKE 1 TABLET DAILY 90 tablet 1   tiZANidine (ZANAFLEX) 2 MG tablet Take 1 tablet (2 mg total) by mouth every 8 (eight) hours as needed for muscle spasms. 10 tablet 0   triamcinolone cream (KENALOG) 0.1 % Apply 1 application topically 2 (two) times daily. (Patient taking differently: Apply 1 application  topically 2 (two) times daily as needed (itching).) 453.6 g 1   zoledronic  acid (RECLAST) 5 MG/100ML SOLN injection Inject 5 mg into the vein See admin instructions. Once a year     No current facility-administered medications for this visit.    VITAL SIGNS: There were no vitals taken for this visit. There were no vitals filed for this visit.  Estimated body mass index is 23.28 kg/m as calculated from the following:   Height as of 09/24/23: 5\' 5"  (1.651 m).   Weight as of 10/07/23: 139 lb 14.4 oz (63.5 kg).  LABS: CBC:    Component Value Date/Time   WBC 4.5 10/07/2023 0926   WBC 8.8 08/01/2023 0420   HGB 10.0 (L) 10/07/2023 0926   HGB 13.0 12/04/2021 1359   HCT 31.6 (L) 10/07/2023 0926   HCT 37.7 12/04/2021 1359   PLT 317 10/07/2023 0926  PLT 317 01/11/2019 1025   MCV 99.1 10/07/2023 0926   MCV 92 01/11/2019 1025   NEUTROABS 2.8 10/07/2023 0926   NEUTROABS 3.9 01/11/2019 1025   LYMPHSABS 0.5 (L) 10/07/2023 0926   LYMPHSABS 1.7 01/11/2019 1025   MONOABS 0.8 10/07/2023 0926   EOSABS 0.3 10/07/2023 0926   EOSABS 0.1 01/11/2019 1025   BASOSABS 0.1 10/07/2023 0926   BASOSABS 0.1 01/11/2019 1025   Comprehensive Metabolic Panel:    Component Value Date/Time   NA 138 10/07/2023 0926   NA 139 12/04/2021 1359   K 4.3 10/07/2023 0926   CL 106 10/07/2023 0926   CO2 27 10/07/2023 0926   BUN 19 10/07/2023 0926   BUN 20 12/04/2021 1359   CREATININE 1.19 10/07/2023 0926   GLUCOSE 159 (H) 10/07/2023 0926   CALCIUM 9.3 10/07/2023 0926   AST 32 10/07/2023 0926   ALT 5 10/07/2023 0926   ALKPHOS 73 10/07/2023 0926   BILITOT 0.8 10/07/2023 0926   PROT 6.5 10/07/2023 0926   PROT 6.8 12/04/2021 1359   ALBUMIN 3.6 10/07/2023 0926   ALBUMIN 4.6 12/04/2021 1359    RADIOGRAPHIC STUDIES: CT CHEST ABDOMEN PELVIS W CONTRAST Result Date: 10/04/2023 CLINICAL DATA:  Pancreatic cancer, assess treatment response EXAM: CT CHEST, ABDOMEN, AND PELVIS WITH CONTRAST TECHNIQUE: Multidetector CT imaging of the chest, abdomen and pelvis was performed following the standard  protocol during bolus administration of intravenous contrast. RADIATION DOSE REDUCTION: This exam was performed according to the departmental dose-optimization program which includes automated exposure control, adjustment of the mA and/or kV according to patient size and/or use of iterative reconstruction technique. CONTRAST:  OMNIPAQUE IOHEXOL 300 MG/ML  SOLN COMPARISON:  CT abdomen and pelvis 08/06/2023. CT chest 07/05/2023. FINDINGS: CT CHEST FINDINGS Cardiovascular: Right chest wall infusion port with the distal tip terminating in the cavoatrial junction. Mild thoracic aorta and coronary artery atherosclerotic calcifications. Normal heart size. No pericardial effusion. Mediastinum/Nodes: No enlarged mediastinal, hilar, or axillary lymph nodes. Thyroid gland, trachea, and esophagus demonstrate no significant findings. Lungs/Pleura: Bibasilar pulmonary nodules, similar from October 2024. Reference made to a 16 mm right middle lobe nodule (5:103), a 12 mm right lower lobe nodule (5:99) and multiple subcentimeter left lower lobe nodules (5:105-114), which appear stable. No new nodules, focal consolidation, or pleural effusion. No pneumothorax. Musculoskeletal: No chest wall mass or suspicious bone lesions identified. Chronic T7, T8, T9 compression deformities with vertebral augmentations at T8. CT ABDOMEN PELVIS FINDINGS Hepatobiliary: Unchanged hepatic cysts. No enhancing lesion. Cholelithiasis. Mild intrahepatic biliary ductal dilation, similar from prior Pancreas: Similar, diffuse main duct dilation up to 16 mm with heterogeneous parenchymal enhancement in the pancreatic head at the site of known neoplasm which measures approximately 2.4 x 1.8 cm (2:68). Spleen: Normal in size without focal abnormality. Adrenals/Urinary Tract: No adrenal nodules. Prior left nephrectomy. Normal right kidney without hydronephrosis, calculus, or enhancing mass. Stomach/Bowel: Stomach is within normal limits. Appendix appears  normal. No evidence of bowel wall thickening, distention, or inflammatory changes. Vascular/Lymphatic: Aortic atherosclerosis. No enlarged abdominal or pelvic lymph nodes. Reproductive: Prostate is unremarkable. Other: No abdominal wall hernia or abnormality. No abdominopelvic ascites. Musculoskeletal: No acute or significant osseous findings. Right hip total arthroplasty. Chronic L1 and L2 compression deformities with prior vertebroplasty. Stable sclerosis of the left iliac tuberosity, possibly related to chronic enthesopathy. IMPRESSION: 1. Overall similar findings from prior including a heterogeneous pancreatic head mass with associated duct dilation and multiple stable pulmonary metastases. 2. No evidence of new or progressive metastatic disease  in the chest, abdomen or pelvis. 3. Left iliac bone sclerotic lesion, possibly related to chronic enthesopathy and unchanged from prior. Electronically Signed   By: Levi Aland M.D.   On: 10/04/2023 12:58    PERFORMANCE STATUS (ECOG) : 1 - Symptomatic but completely ambulatory  Review of Systems  Constitutional:  Positive for activity change, appetite change and fatigue.  Musculoskeletal:  Positive for arthralgias and back pain.  Unless otherwise noted, a complete review of systems is negative.  Physical Exam General: NAD Cardiovascular: regular rate and rhythm Pulmonary: clear ant fields Abdomen: soft, nontender, + bowel sounds Extremities: no edema, no joint deformities Skin: no rashes Neurological: Alert and oriented x3  IMPRESSION:  This is my initial visit with [name] [name]. He is accompanied by his son and adopted daughter [name], with his daughter Durenda Age on the phone. He is alert and oriented able to appropriately engage in discussions.   I introduced myself, Maygan RN, and Palliative's role in collaboration with the oncology team. Concept of Palliative Care was introduced as specialized medical care for people and their families  living with serious illness.  It focuses on providing relief from the symptoms and stress of a serious illness.  The goal is to improve quality of life for both the patient and the family. Values and goals of care important to patient and family were attempted to be elicited.   Mr. Adami lives alone in independent living facility. He has the 3 children. His daughter, Durenda Age lives in [place]. Is able to perform most ADLs independently however with limitations due to fatigue and pain.  He has Parkinson's disease, which affects his ability to taste, compounded by chemotherapy, leading to decreased appetite and weight loss from 145 pounds (1/17) to 139 pounds current weight. He feels appetite is slowly improving. We discussed overall nutrition in detail with ways to improve. He understands we will continue to closely monitor with consideration of appetite stimulant (Marinol) in the future if needed.   Mr. Muckey reports significant fatigue and decreased endurance, which he attributes to a 'double dose' of therapy. Despite this, he remains active and independent, using a rollator for mobility without constant assistance.  The patient reports experiencing significant pain, primarily in the lower back and chest wall area, described as discomfort and stabbing preventing him from standing up straight. Pain levels can reach 8 or 9 out of 10 at his worst, but with the use of oxycodone tablets daily, the pain reduces to a 2 or 3. If he does not take his oxycodone states his pain is "off the chart". He also experiences high pain levels in the rib and stomach area, somewhat controlled with oxycodone. Occasionally, he uses tizanidine for muscle relaxation. Miralax is used to manage bowel movements due to medication side effects.   We discussed his pain regimen at length. I have recommended increasing his oxycodone to every 4-6 hours as needed for pain. Continue with tizanidine as needed. We will continue to closely  monitor and adjust as needed. Refills to be sent to pharmacy. Patient knows to contact office for refills.   All questions answered and support provided.  I discussed the importance of continued conversation with family and their medical providers regarding overall plan of care and treatment options, ensuring decisions are within the context of the patients values and GOCs.  PLAN: Established therapeutic relationship. Education provided on palliative's role in collaboration with their Oncology/Radiation team.  Cancer-related Pain Lower back discomfort, not severe pain. Currently managed with  Oxycodone 5mg  three times daily. Pain at worst is 8-9/10, but with Oxycodone, it reduces to 2-3/10. -Increase Oxycodone 5mg  to every 4-6 hours as needed for pain. -Send refill prescription to [facility]. Patient aware if he requires home delivery can consider using [facility] outpatient pharmacy.   Constipation Likely secondary to Oxycodone use. Currently managed with Miralax. -Continue Miralax as needed for constipation.  Muscle Spasms Patient has Tizanidine available but does not take it regularly. -Continue Tizanidine as needed for muscle spasms. -Patient to notify provider when refill is needed.  Weight Loss Patient has lost weight recently, possibly due to decreased appetite secondary to chemotherapy and Parkinson's disease. -Monitor weight closely. -Consider appetite stimulant if weight continues to decrease.  Parkinson's Disease Patient reports loss of taste, possibly contributing to decreased appetite and weight loss. -Continue current management.  Cancer-related Fatigue Patient reports fatigue and decreased endurance, possibly secondary to chemotherapy. -Administer IV fluids today in the infusion room. Chemo has been cancelled for today per Dr. Mosetta Putt.   Follow-up -Next appointment on 10/21/2023. -Patient to contact provider if any changes or concerns arise before next appointment.  Patient  expressed understanding and was in agreement with this plan. He also understands that He can call the clinic at any time with any questions, concerns, or complaints.   Thank you for your referral and allowing Palliative to assist in Mr. Boby Eyer [facility] care.   Number and complexity of problems addressed: HIGH - 1 or more chronic illnesses with SEVERE exacerbation, progression, or side effects of treatment - advanced cancer, pain. Any controlled substances utilized were prescribed in the context of palliative care.   Visit consisted of counseling and education dealing with the complex and emotionally intense issues of symptom management and palliative care in the setting of serious and potentially life-threatening illness.  Signed by: Willette Alma, AGPCNP-BC Palliative Medicine Team/[facility]

## 2023-10-19 ENCOUNTER — Encounter: Payer: Self-pay | Admitting: Neurology

## 2023-10-19 DIAGNOSIS — M6281 Muscle weakness (generalized): Secondary | ICD-10-CM | POA: Diagnosis not present

## 2023-10-19 DIAGNOSIS — R2689 Other abnormalities of gait and mobility: Secondary | ICD-10-CM | POA: Diagnosis not present

## 2023-10-20 ENCOUNTER — Other Ambulatory Visit: Payer: Self-pay

## 2023-10-20 NOTE — Assessment & Plan Note (Signed)
diagnosed in 09/2021, deemed poor surgical candidate due to his age and medical co-morbidities -S/p single agent gemcitabine 01/13/22 - 04/01/22 and 5 fx SBRT [name]) 05/04/22 - 05/14/22. CA 19-9 decreased after treatment -Surveillance CT 11/09/2022 showed slightly decreased size of pancreatic head mass, no evidence of metastatic disease.  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  He is taking ibuprofen as needed.  No other clinical concern for cancer progression. -Lab reviewed, overall stable, tumor marker still pending. -He was admitted for right femoral neck fracture  on 02/03/23 after a fall and had surgery  -CT scan from May 17, 2023 showed multiple lung nodules, with dominant 1.5 cm nodule in the right middle lobe, highly concerning for metastasis.  His lung lesions are positive on PET scan. -Bronchoscopy and biopsy attempted, which showed atypical cells. -He restarted chemo gemcitabine and abraxane on 07/29/2023. -restaging CT 10/01/2023 showed stable disease  -Due to increasing fatigue and weakness, chemotherapy changed to single agent gemcitabine on 10/07/2023.  Today, 10/21/2023 will be for treatment with single agent gemcitabine.

## 2023-10-20 NOTE — Progress Notes (Unsigned)
Patient Care Team: Lorenda Ishihara, MD as PCP - General (Internal Medicine) Tessa Lerner, DO as PCP - Cardiology (Cardiology) Tat, Octaviano Batty, DO as Consulting Physician (Neurology) Earna Coder, MD as Consulting Physician (Hematology and Oncology) Fritzi Mandes, MD as Consulting Physician (General Surgery) Malachy Mood, MD as Consulting Physician (Hematology and Oncology) Josephine Igo, DO as Consulting Physician (Pulmonary Disease) Pickenpack-Cousar, Arty Baumgartner, NP as Nurse Practitioner [facility])  Clinic Day:  10/22/2023  Referring physician: Malachy Mood, MD  ASSESSMENT & PLAN:   Assessment & Plan: Pancreatic cancer [facility]) diagnosed in 09/2021, deemed poor surgical candidate due to his age and medical co-morbidities -S/p single agent gemcitabine 01/13/22 - 04/01/22 and 5 fx SBRT [name]) 05/04/22 - 05/14/22. CA 19-9 decreased after treatment -Surveillance CT 11/09/2022 showed slightly decreased size of pancreatic head mass, no evidence of metastatic disease.  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  He is taking ibuprofen as needed.  No other clinical concern for cancer progression. -Lab reviewed, overall stable, tumor marker still pending. -He was admitted for right femoral neck fracture  on 02/03/23 after a fall and had surgery  -CT scan from May 17, 2023 showed multiple lung nodules, with dominant 1.5 cm nodule in the right middle lobe, highly concerning for metastasis.  His lung lesions are positive on PET scan. -Bronchoscopy and biopsy attempted, which showed atypical cells. -He restarted chemo gemcitabine and abraxane on 07/29/2023. -restaging CT 10/01/2023 showed stable disease  -Due to increasing fatigue and weakness, chemotherapy changed to single agent gemcitabine on 10/07/2023.  Today, 10/21/2023 will be for treatment with single agent gemcitabine.   Plan: Labs reviewed with  patient.  They are adequate for treatment.  Patient states he feels strong enough and would like to proceed with treatment. -CA 19-9 is down approximately 200 points to 535. Will give additional 500 cc normal saline during treatment today due to orthostasis. Patient to meet with Lowella Bandy, NP for long-term pain management. Discussed eating even smaller bites of food and making sure 1 bite of food is swallowed before adding any more food.  May help with swelling.  Will reduce risk of choking or esophageal obstruction. Proceed with single agent gemcitabine today. Labs with flush, follow-up with Dr. Mosetta Putt, and next treatment as scheduled on 11/04/2023.  The patient understands the plans discussed today and is in agreement with them.  He knows to contact our office if he develops concerns prior to his next appointment.  I provided 25 minutes of face-to-face time during this encounter and > 50% was spent counseling as documented under my assessment and plan.    Carlean Jews, NP  [facility] - A DEPT OF [facility] [address] [place] [address] Dept: [phone] Dept Fax: [phone]   No orders of the defined types were placed in this encounter.     CHIEF COMPLAINT:  CC: Follow-up pancreatic cancer  Current Treatment: Chemotherapy gemcitabine every 2 weeks  INTERVAL HISTORY:  [name] [name] is here today for repeat clinical assessment.  He was last seen by Dr. Mosetta Putt on 10/07/2023.  Most recent CT done 10/01/2023 showed stable disease.  He was changed to single agent gemcitabine at last visit due to increased fatigue.  He reports that over just the past few days he has started to feel slightly stronger.  Does have PT at home.  Family members also making sure he moves around and walks, and helping him to maintain his strength.  He states his appetite is fair.  He gets full and feels bloated very quickly.  Drinking plenty of fluid,  including protein drinks to maintain caloric intake.  Does keep up with bowel regimen with MiraLAX so he does not get constipated.  Today will be for treatment of single agent gemcitabine. He denies chest pain, chest pressure, or shortness of breath. He denies headaches or visual disturbances. He denies fevers or chills. His weight has been stable.  I have reviewed the past medical history, past surgical history, social history and family history with the patient and they are unchanged from previous note.  ALLERGIES:  is allergic to influenza vaccines and influenza virus vaccine.  MEDICATIONS:  Current Outpatient Medications  Medication Sig Dispense Refill   acetaminophen (TYLENOL) 500 MG tablet Take 1 tablet (500 mg total) by mouth every 6 (six) hours as needed for moderate pain or mild pain. (Patient taking differently: Take 500 mg by mouth as needed for moderate pain (pain score 4-6) or mild pain (pain score 1-3).) 30 tablet 0   ammonium lactate (LAC-HYDRIN) 12 % lotion Apply 1 Application topically as needed.     bisacodyl (DULCOLAX) 10 MG suppository Place 1 suppository (10 mg total) rectally daily as needed for moderate constipation. 12 suppository 0   carbidopa-levodopa (SINEMET CR) 50-200 MG tablet TAKE 1 TABLET AT BEDTIME 90 tablet 3   carbidopa-levodopa (SINEMET IR) 25-100 MG tablet Take 3 tablets at 7 AM, 2 tablets at 11 AM, 3 tablets at 3 PM, 2 tablets at 7 PM 900 tablet 0   ciclopirox (PENLAC) 8 % solution Apply 1 Application topically at bedtime.     clonazePAM (KLONOPIN) 0.5 MG tablet Take 1 tablet (0.5 mg total) by mouth at bedtime. 5 tablet 0   cyanocobalamin (VITAMIN B12) 1000 MCG tablet Take 1,000 mcg by mouth daily.     diltiazem (CARDIZEM) 30 MG tablet Take 1 tablet (30 mg total) by mouth 2 (two) times daily. Do not take if systolic blood pressure (top number) is less than 100, or if pulse if less than 55. 180 tablet 3   ELIQUIS 5 MG TABS tablet TAKE 1 TABLET TWICE A DAY 120  tablet 5   Glycerin-Hypromellose-PEG 400 (VISINE DRY EYE OP) Place 1 drop into both eyes 2 (two) times daily as needed (eye irritation).      ketoconazole (NIZORAL) 2 % shampoo Apply 1 application  topically 3 (three) times a week.     leuprolide, 6 Month, (ELIGARD) 45 MG injection Inject 45 mg into the skin every 6 (six) months.     lidocaine-prilocaine (EMLA) cream Apply to port site 1 to 2 hours prior to appointments/treatment 30 g 3   omeprazole (PRILOSEC) 40 MG capsule Take 1 capsule (40 mg total) by mouth daily. 30 capsule 3   ondansetron (ZOFRAN) 4 MG tablet Take 1 tablet (4 mg total) by mouth every 6 (six) hours as needed for nausea. 30 tablet 3   oxyCODONE (OXY IR/ROXICODONE) 5 MG immediate release tablet Take 1 tablet (5 mg total) by mouth every 4 (four) hours as needed for severe pain (pain score 7-10). 90 tablet 0   PANCREAZE 37000-97300 units CPEP TAKE 2 CAPSULES WITH BREAKFAST, LUNCH, AND EVENING MEAL (WITH EACH MEAL) AND 1 CAPSULE WITH SNACK 600 capsule 5   polyethylene glycol powder (MIRALAX) 17 GM/SCOOP powder Take 17 g by mouth in the morning,  at noon, and at bedtime.     prochlorperazine (COMPAZINE) 10 MG tablet Take 1 tablet (10 mg total) by mouth every 6 (six) hours as needed for nausea or vomiting. 30 tablet 2   rosuvastatin (CRESTOR) 10 MG tablet TAKE 1 TABLET DAILY 90 tablet 1   tiZANidine (ZANAFLEX) 2 MG tablet Take 1 tablet (2 mg total) by mouth every 8 (eight) hours as needed for muscle spasms. 10 tablet 0   triamcinolone cream (KENALOG) 0.1 % Apply 1 application topically 2 (two) times daily. (Patient taking differently: Apply 1 application  topically 2 (two) times daily as needed (itching).) 453.6 g 1   zoledronic acid (RECLAST) 5 MG/100ML SOLN injection Inject 5 mg into the vein See admin instructions. Once a year     No current facility-administered medications for this visit.    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 Sclerotic 1.5 cm left ischial lesion/ Sclerotic medial left iliac bone 1.5 cm lesion (series 2/image 47), increased from 1.0 cm. Gleason score of 4+5= 9; with almost all cores involved greater than 80%.  9/16 Lupron 68-month depot on 9/16. [Urology; Dr.Siniski]  # MID OCT 2020- Zytiga 1000 mg+ prednisone; stopped December 2021 [poor tolerance if with RVR]; DISCONTINUED.   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       Prostate cancer metastatic to bone (HCC)  05/24/2019 Initial Diagnosis   Prostate cancer metastatic to bone [facility])   Pancreatic cancer (HCC)  10/02/2021 Imaging   CT ABDOMEN PELVIS W CONTRAST   IMPRESSION: 1. There is new pancreatic ductal dilation with an indeterminate 19 mm hypodense low-density mass area in the head/uncinate process of the pancreas. Recommend further evaluation with dedicated MRI/MRCP with contrast. 2. No evidence of bowel obstruction.  Moderate rectal stool ball. 3. Scattered peripherally located clustered pulmonary nodules in the RIGHT middle lobe. Findings are likely infectious or inflammatory in etiology. Given history of malignancy, recommend follow-up as per clinical protocol.   10/28/2021 Imaging   MR ABDOMEN MRCP W WO CONTAST   IMPRESSION: 1. Examination is significantly limited by breath motion artifact throughout. 2. The main pancreatic duct is diffusely dilated from the level of the superior pancreatic head, measuring up to 0.7 cm. 3. In the inferior pancreatic head and uncinate, there is a multilobulated, fluid signal cystic lesion measuring 1.8 x 1.0 cm. Due to breath motion artifact it is difficult to determine whether this communicates to the adjacent duct. There are multiple additional  subcentimeter cystic lesions scattered throughout the pancreas, several of which clearly communicate to the main pancreatic duct. 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 Recommend EUS/FNA for further diagnosis given the presence of pancreatic ductal dilatation. 4. Status post left nephrectomy. 5. No evidence of recurrent or metastatic disease in the abdomen.   12/25/2021 Procedure   UPPER ENDOSCOPIC ULTRASOUND-By Dr. Meridee Score  - A mass-like region was identified in the pancreatic head where the pancreatic duct dilates with multiple cystic regions noted throughout the pancreas as well. 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 However, the endosonographic appearance is suspicious for potential adenocarcinoma. This was staged T2 N0 Mx by endosonographic criteria. T - No malignant-appearing lymph nodes  were visualized in the celiac region (level 20), peripancreatic region and porta hepatis region.   12/25/2021 Pathology Results   CYTOLOGY - NON PAP  CASE: [id number]   FINAL MICROSCOPIC DIAGNOSIS:  A. PANCREAS, HEAD, FINE NEEDLE ASPIRATION:  - Malignant cells consistent with adenocarcinoma     01/01/2022 Initial Diagnosis   Pancreatic cancer (HCC)   01/13/2022 - 04/01/2022 Chemotherapy   Patient is on Treatment Plan : PANCREAS Gemcitabine D1,15 q28d x 4 Cycles     01/26/2022 Cancer Staging   Staging form: Exocrine Pancreas, AJCC 8th Edition - Clinical: Stage IB (cT2, cN0, cM0) - Signed by Malachy Mood, MD on 01/26/2022 Total positive nodes: 0    Genetic Testing   [facility] CancerNext-Expanded Panel was Negative. Of note, a variant of uncertain significance was identified in the BLM gene (p.[id number]) and GALNT12 gene ([id number]). Report date is 01/26/2022.  The CancerNext-Expanded gene panel offered by [facility] and includes sequencing, rearrangement, and RNA analysis for the following 77 genes: AIP, ALK, APC,  ATM, AXIN2, BAP1, BARD1, BLM, BMPR1A, BRCA1, BRCA2, BRIP1, CDC73, CDH1, CDK4, CDKN1B, CDKN2A, CHEK2, CTNNA1, DICER1, FANCC, FH, FLCN, GALNT12, KIF1B, LZTR1, MAX, MEN1, MET, MLH1, MSH2, MSH3, MSH6, MUTYH, NBN, NF1, NF2, NTHL1, PALB2, PHOX2B, PMS2, POT1, PRKAR1A, PTCH1, PTEN, RAD51C, RAD51D, RB1, RECQL, RET, SDHA, SDHAF2, SDHB, SDHC, SDHD, SMAD4, SMARCA4, SMARCB1, SMARCE1, STK11, SUFU, TMEM127, TP53, TSC1, TSC2, VHL and XRCC2 (sequencing and deletion/duplication); EGFR, EGLN1, HOXB13, KIT, MITF, PDGFRA, POLD1, and POLE (sequencing only); EPCAM and GREM1 (deletion/duplication only).    08/12/2022 Imaging    IMPRESSION: 1. Slight interval increase in size of the pancreatic head-uncinate process mass with increasing direct contact of the SMA and portal vein as discussed. Also with a replaced RIGHT hepatic artery which passes through the tumor. 2. No signs of acute inflammation currently about the pancreas. With similar appearance of ductal obstruction and peripheral atrophy of pancreas due to the tumor. 3. Post LEFT nephrectomy. 4. Sclerotic bony lesions of the LEFT ischium and LEFT iliac bone similar to prior imaging. 5. Mild hyperenhancement of LEFT prostate 14 mm with asymmetry, nonspecific. This is unchanged in this patient with history of prostate neoplasm. 6. Aortic atherosclerosis.   07/29/2023 -  Chemotherapy   Patient is on Treatment Plan : PANCREATIC Abraxane D1,8,15 + Gemcitabine D1,8,15 q28d     08/03/2023 Imaging   CT chest abdomen pelvis with contrast IMPRESSION: 1. No substantial change in size of heterogeneously enhancing pancreatic head mass with adjacent fiducials. 2. Slightly increased main pancreatic ductal dilation and mild intrahepatic bile duct dilation. 3. No evidence of new or progressive metastatic disease in the abdomen or pelvis. 4. Similar mildly sclerotic focus in the left ischial tuberosity dating back to 01/07/2019. A left iliac bone faintly sclerotic lesion  measuring 1.4 cm has decreased in density since 2020, likely treated osseous metastasis. 5. Left lower lobe nodules measuring up to 5 mm are unchanged. 6. Aortic Atherosclerosis (ICD10-I70.0).         REVIEW OF SYSTEMS:   Constitutional: Denies fevers, chills or abnormal weight loss Eyes: Denies blurriness of vision Ears, nose, mouth, throat, and face: Denies mucositis or sore throat Respiratory: Denies cough, dyspnea or wheezes Cardiovascular: Denies palpitation, chest discomfort or lower extremity swelling Gastrointestinal:  Denies nausea, heartburn or change in bowel habits.  Does stay on regular bowel regimen with MiraLAX.  Reports new difficulty with swallowing which has worsened a great deal over past 3 months. Skin: Denies abnormal skin rashes Lymphatics: Denies new lymphadenopathy or easy  bruising Neurological:Denies numbness, does have generalized weakness.  Easily fatigued. Behavioral/Psych: Mood is stable, no new changes  All other systems were reviewed with the patient and are negative.   VITALS:   Today's Vitals   10/21/23 1249 10/21/23 1258  BP: 108/89   Pulse: (!) 118   Resp: 18   Temp: 97.9 F (36.6 C)   TempSrc: Temporal   SpO2: 99%   Weight: 139 lb 11.2 oz (63.4 kg)   Height: 5\' 5"  (1.651 m)   PainSc:  4    Body mass index is 23.25 kg/m.   Wt Readings from Last 3 Encounters:  10/21/23 139 lb 11.2 oz (63.4 kg)  10/07/23 139 lb 14.4 oz (63.5 kg)  09/24/23 145 lb 3.2 oz (65.9 kg)    Body mass index is 23.25 kg/m.  Performance status (ECOG): 2 - Symptomatic, <50% confined to bed  PHYSICAL EXAM:   GENERAL:alert, no distress and comfortable SKIN: skin color, texture, turgor are normal, no rashes or significant lesions EYES: normal, Conjunctiva are pink and non-injected, sclera clear OROPHARYNX:no exudate, no erythema and lips, buccal mucosa, and tongue normal  NECK: supple, thyroid normal size, non-tender, without nodularity LYMPH:  no palpable  lymphadenopathy in the cervical, axillary or inguinal LUNGS: clear to auscultation and percussion with normal breathing effort HEART: regular rate & rhythm and no murmurs and no lower extremity edema ABDOMEN:abdomen soft, non-tender and normal bowel sounds Musculoskeletal:no cyanosis of digits and no clubbing.  He does require the use of a rollator walker for assistance with balance and mobility. NEURO: alert & oriented x 3 with fluent speech, no focal motor/sensory deficits  LABORATORY DATA:  I have reviewed the data as listed    Component Value Date/Time   NA 139 10/21/2023 1233   NA 139 12/04/2021 1359   K 4.0 10/21/2023 1233   CL 108 10/21/2023 1233   CO2 27 10/21/2023 1233   GLUCOSE 129 (H) 10/21/2023 1233   BUN 19 10/21/2023 1233   BUN 20 12/04/2021 1359   CREATININE 0.94 10/21/2023 1233   CALCIUM 9.3 10/21/2023 1233   PROT 6.6 10/21/2023 1233   PROT 6.8 12/04/2021 1359   ALBUMIN 3.7 10/21/2023 1233   ALBUMIN 4.6 12/04/2021 1359   AST 31 10/21/2023 1233   ALT <5 10/21/2023 1233   ALKPHOS 76 10/21/2023 1233   BILITOT 1.2 10/21/2023 1233   GFRNONAA >60 10/21/2023 1233   GFRAA 47 (L) 05/06/2020 0935    Lab Results  Component Value Date   WBC 9.0 10/21/2023   NEUTROABS 6.8 10/21/2023   HGB 10.9 (L) 10/21/2023   HCT 34.7 (L) 10/21/2023   MCV 98.3 10/21/2023   PLT 243 10/21/2023     RADIOGRAPHIC STUDIES: CT CHEST ABDOMEN PELVIS W CONTRAST Result Date: 10/04/2023 CLINICAL DATA:  Pancreatic cancer, assess treatment response EXAM: CT CHEST, ABDOMEN, AND PELVIS WITH CONTRAST TECHNIQUE: Multidetector CT imaging of the chest, abdomen and pelvis was performed following the standard protocol during bolus administration of intravenous contrast. RADIATION DOSE REDUCTION: This exam was performed according to the departmental dose-optimization program which includes automated exposure control, adjustment of the mA and/or kV according to patient size and/or use of iterative  reconstruction technique. CONTRAST:  OMNIPAQUE IOHEXOL 300 MG/ML  SOLN COMPARISON:  CT abdomen and pelvis 08/06/2023. CT chest 07/05/2023. FINDINGS: CT CHEST FINDINGS Cardiovascular: Right chest wall infusion port with the distal tip terminating in the cavoatrial junction. Mild thoracic aorta and coronary artery atherosclerotic calcifications. Normal heart size. No pericardial effusion.  Mediastinum/Nodes: No enlarged mediastinal, hilar, or axillary lymph nodes. Thyroid gland, trachea, and esophagus demonstrate no significant findings. Lungs/Pleura: Bibasilar pulmonary nodules, similar from October 2024. Reference made to a 16 mm right middle lobe nodule (5:103), a 12 mm right lower lobe nodule (5:99) and multiple subcentimeter left lower lobe nodules (5:105-114), which appear stable. No new nodules, focal consolidation, or pleural effusion. No pneumothorax. Musculoskeletal: No chest wall mass or suspicious bone lesions identified. Chronic T7, T8, T9 compression deformities with vertebral augmentations at T8. CT ABDOMEN PELVIS FINDINGS Hepatobiliary: Unchanged hepatic cysts. No enhancing lesion. Cholelithiasis. Mild intrahepatic biliary ductal dilation, similar from prior Pancreas: Similar, diffuse main duct dilation up to 16 mm with heterogeneous parenchymal enhancement in the pancreatic head at the site of known neoplasm which measures approximately 2.4 x 1.8 cm (2:68). Spleen: Normal in size without focal abnormality. Adrenals/Urinary Tract: No adrenal nodules. Prior left nephrectomy. Normal right kidney without hydronephrosis, calculus, or enhancing mass. Stomach/Bowel: Stomach is within normal limits. Appendix appears normal. No evidence of bowel wall thickening, distention, or inflammatory changes. Vascular/Lymphatic: Aortic atherosclerosis. No enlarged abdominal or pelvic lymph nodes. Reproductive: Prostate is unremarkable. Other: No abdominal wall hernia or abnormality. No abdominopelvic ascites.  Musculoskeletal: No acute or significant osseous findings. Right hip total arthroplasty. Chronic L1 and L2 compression deformities with prior vertebroplasty. Stable sclerosis of the left iliac tuberosity, possibly related to chronic enthesopathy. IMPRESSION: 1. Overall similar findings from prior including a heterogeneous pancreatic head mass with associated duct dilation and multiple stable pulmonary metastases. 2. No evidence of new or progressive metastatic disease in the chest, abdomen or pelvis. 3. Left iliac bone sclerotic lesion, possibly related to chronic enthesopathy and unchanged from prior. Electronically Signed   By: Levi Aland M.D.   On: 10/04/2023 12:58

## 2023-10-21 ENCOUNTER — Inpatient Hospital Stay (HOSPITAL_BASED_OUTPATIENT_CLINIC_OR_DEPARTMENT_OTHER): Payer: Medicare Other | Admitting: Nurse Practitioner

## 2023-10-21 ENCOUNTER — Encounter: Payer: Self-pay | Admitting: Hematology

## 2023-10-21 ENCOUNTER — Ambulatory Visit: Payer: Medicare Other

## 2023-10-21 ENCOUNTER — Ambulatory Visit: Payer: Medicare Other | Admitting: Nurse Practitioner

## 2023-10-21 ENCOUNTER — Other Ambulatory Visit: Payer: Medicare Other

## 2023-10-21 ENCOUNTER — Encounter: Payer: Self-pay | Admitting: Nurse Practitioner

## 2023-10-21 ENCOUNTER — Inpatient Hospital Stay: Payer: Medicare Other | Attending: Internal Medicine

## 2023-10-21 ENCOUNTER — Inpatient Hospital Stay: Payer: Medicare Other

## 2023-10-21 ENCOUNTER — Encounter: Payer: Self-pay | Admitting: Acute Care

## 2023-10-21 VITALS — BP 108/89 | HR 118 | Temp 97.9°F | Resp 18 | Ht 65.0 in | Wt 139.7 lb

## 2023-10-21 DIAGNOSIS — M62838 Other muscle spasm: Secondary | ICD-10-CM | POA: Insufficient documentation

## 2023-10-21 DIAGNOSIS — Z95828 Presence of other vascular implants and grafts: Secondary | ICD-10-CM

## 2023-10-21 DIAGNOSIS — Z5111 Encounter for antineoplastic chemotherapy: Secondary | ICD-10-CM | POA: Diagnosis present

## 2023-10-21 DIAGNOSIS — R530 Neoplastic (malignant) related fatigue: Secondary | ICD-10-CM | POA: Diagnosis not present

## 2023-10-21 DIAGNOSIS — G893 Neoplasm related pain (acute) (chronic): Secondary | ICD-10-CM | POA: Insufficient documentation

## 2023-10-21 DIAGNOSIS — C7951 Secondary malignant neoplasm of bone: Secondary | ICD-10-CM

## 2023-10-21 DIAGNOSIS — F1190 Opioid use, unspecified, uncomplicated: Secondary | ICD-10-CM | POA: Diagnosis not present

## 2023-10-21 DIAGNOSIS — G4730 Sleep apnea, unspecified: Secondary | ICD-10-CM | POA: Diagnosis not present

## 2023-10-21 DIAGNOSIS — C250 Malignant neoplasm of head of pancreas: Secondary | ICD-10-CM

## 2023-10-21 DIAGNOSIS — K5903 Drug induced constipation: Secondary | ICD-10-CM

## 2023-10-21 DIAGNOSIS — Z887 Allergy status to serum and vaccine status: Secondary | ICD-10-CM | POA: Insufficient documentation

## 2023-10-21 DIAGNOSIS — K5900 Constipation, unspecified: Secondary | ICD-10-CM | POA: Diagnosis not present

## 2023-10-21 DIAGNOSIS — Z79899 Other long term (current) drug therapy: Secondary | ICD-10-CM | POA: Insufficient documentation

## 2023-10-21 DIAGNOSIS — Z515 Encounter for palliative care: Secondary | ICD-10-CM | POA: Diagnosis not present

## 2023-10-21 DIAGNOSIS — C61 Malignant neoplasm of prostate: Secondary | ICD-10-CM | POA: Diagnosis not present

## 2023-10-21 DIAGNOSIS — N400 Enlarged prostate without lower urinary tract symptoms: Secondary | ICD-10-CM | POA: Diagnosis not present

## 2023-10-21 DIAGNOSIS — F17290 Nicotine dependence, other tobacco product, uncomplicated: Secondary | ICD-10-CM | POA: Insufficient documentation

## 2023-10-21 DIAGNOSIS — G20A1 Parkinson's disease without dyskinesia, without mention of fluctuations: Secondary | ICD-10-CM | POA: Insufficient documentation

## 2023-10-21 DIAGNOSIS — Z7901 Long term (current) use of anticoagulants: Secondary | ICD-10-CM | POA: Insufficient documentation

## 2023-10-21 DIAGNOSIS — R5383 Other fatigue: Secondary | ICD-10-CM | POA: Diagnosis not present

## 2023-10-21 DIAGNOSIS — C259 Malignant neoplasm of pancreas, unspecified: Secondary | ICD-10-CM

## 2023-10-21 LAB — CMP (CANCER CENTER ONLY)
ALT: <5 U/L (ref 0–44)
AST: 31 U/L (ref 15–41)
Albumin: 3.7 g/dL (ref 3.5–5.0)
Alkaline Phosphatase: 76 U/L (ref 38–126)
Anion gap: 4 — ABNORMAL LOW (ref 5–15)
BUN: 19 mg/dL (ref 8–23)
CO2: 27 mmol/L (ref 22–32)
Calcium: 9.3 mg/dL (ref 8.9–10.3)
Chloride: 108 mmol/L (ref 98–111)
Creatinine: 0.94 mg/dL (ref 0.61–1.24)
GFR, Estimated: >60 mL/min (ref >60)
Glucose, Bld: 129 mg/dL — ABNORMAL HIGH (ref 70–99)
Potassium: 4 mmol/L (ref 3.5–5.1)
Sodium: 139 mmol/L (ref 135–145)
Total Bilirubin: 1.2 mg/dL (ref 0.0–1.2)
Total Protein: 6.6 g/dL (ref 6.5–8.1)

## 2023-10-21 LAB — CBC WITH DIFFERENTIAL (CANCER CENTER ONLY)
Abs Immature Granulocytes: 0.04 10*3/uL (ref 0.00–0.07)
Basophils Absolute: 0.1 10*3/uL (ref 0.0–0.1)
Basophils Relative: 1 %
Eosinophils Absolute: 0.1 10*3/uL (ref 0.0–0.5)
Eosinophils Relative: 1 %
HCT: 34.7 % — ABNORMAL LOW (ref 39.0–52.0)
Hemoglobin: 10.9 g/dL — ABNORMAL LOW (ref 13.0–17.0)
Immature Granulocytes: 0 %
Lymphocytes Relative: 13 %
Lymphs Abs: 1.2 10*3/uL (ref 0.7–4.0)
MCH: 30.9 pg (ref 26.0–34.0)
MCHC: 31.4 g/dL (ref 30.0–36.0)
MCV: 98.3 fL (ref 80.0–100.0)
Monocytes Absolute: 0.8 10*3/uL (ref 0.1–1.0)
Monocytes Relative: 8 %
Neutro Abs: 6.8 10*3/uL (ref 1.7–7.7)
Neutrophils Relative %: 77 %
Platelet Count: 243 10*3/uL (ref 150–400)
RBC: 3.53 MIL/uL — ABNORMAL LOW (ref 4.22–5.81)
RDW: 17.2 % — ABNORMAL HIGH (ref 11.5–15.5)
WBC Count: 9 10*3/uL (ref 4.0–10.5)
nRBC: 0 % (ref 0.0–0.2)

## 2023-10-21 MED ORDER — SODIUM CHLORIDE 0.9% FLUSH
10.0000 mL | INTRAVENOUS | Status: DC | PRN
  Administered 2023-10-21: 10 mL

## 2023-10-21 MED ORDER — PROCHLORPERAZINE MALEATE 10 MG PO TABS
10.0000 mg | ORAL_TABLET | Freq: Once | ORAL | Status: AC
  Administered 2023-10-21: 10 mg via ORAL
  Filled 2023-10-21: qty 1

## 2023-10-21 MED ORDER — HEPARIN SOD (PORK) LOCK FLUSH 100 UNIT/ML IV SOLN
500.0000 [IU] | Freq: Once | INTRAVENOUS | Status: AC | PRN
  Administered 2023-10-21: 500 [IU]

## 2023-10-21 MED ORDER — SODIUM CHLORIDE 0.9 % IV SOLN
800.0000 mg/m2 | Freq: Once | INTRAVENOUS | Status: AC
  Administered 2023-10-21: 1406 mg via INTRAVENOUS
  Filled 2023-10-21: qty 36.98

## 2023-10-21 MED ORDER — SODIUM CHLORIDE 0.9 % IV SOLN: INTRAVENOUS | Status: DC

## 2023-10-21 MED ORDER — SODIUM CHLORIDE 0.9% FLUSH
10.0000 mL | Freq: Once | INTRAVENOUS | Status: AC
Start: 2023-10-21 — End: 2023-10-21
  Administered 2023-10-21: 10 mL

## 2023-10-21 NOTE — Patient Instructions (Signed)
[facility] - A DEPT OF [facility]  Discharge Instructions: Thank you for choosing [facility] to provide your oncology and hematology care.   If you have a lab appointment with the Cancer Center, please go directly to the Cancer Center and check in at the registration area.   Wear comfortable clothing and clothing appropriate for easy access to any Portacath or PICC line.   We strive to give you quality time with your provider. You may need to reschedule your appointment if you arrive late (15 or more minutes).  Arriving late affects you and other patients whose appointments are after yours.  Also, if you miss three or more appointments without notifying the office, you may be dismissed from the clinic at the provider's discretion.      For prescription refill requests, have your pharmacy contact our office and allow 72 hours for refills to be completed.    Today you received the following chemotherapy and/or immunotherapy agents: gemcitabine (GEMZAR       To help prevent nausea and vomiting after your treatment, we encourage you to take your nausea medication as directed.  BELOW ARE SYMPTOMS THAT SHOULD BE REPORTED IMMEDIATELY: *FEVER GREATER THAN 100.4 F (38 C) OR HIGHER *CHILLS OR SWEATING *NAUSEA AND VOMITING THAT IS NOT CONTROLLED WITH YOUR NAUSEA MEDICATION *UNUSUAL SHORTNESS OF BREATH *UNUSUAL BRUISING OR BLEEDING *URINARY PROBLEMS (pain or burning when urinating, or frequent urination) *BOWEL PROBLEMS (unusual diarrhea, constipation, pain near the anus) TENDERNESS IN MOUTH AND THROAT WITH OR WITHOUT PRESENCE OF ULCERS (sore throat, sores in mouth, or a toothache) UNUSUAL RASH, SWELLING OR PAIN  UNUSUAL VAGINAL DISCHARGE OR ITCHING   Items with * indicate a potential emergency and should be followed up as soon as possible or go to the Emergency Department if any problems should occur.  Please show the CHEMOTHERAPY ALERT CARD or  IMMUNOTHERAPY ALERT CARD at check-in to the Emergency Department and triage nurse.  Should you have questions after your visit or need to cancel or reschedule your appointment, please contact [facility] - A DEPT OF [facility]  Dept: [phone]  and follow the prompts.  Office hours are 8:00 a.m. to 4:30 p.m. Monday - Friday. Please note that voicemails left after 4:00 p.m. may not be returned until the following business day.  We are closed weekends and major holidays. You have access to a nurse at all times for urgent questions. Please call the main number to the clinic Dept: [phone] and follow the prompts.   For any non-urgent questions, you may also contact your provider using MyChart. We now offer e-Visits for anyone 77 and older to request care online for non-urgent symptoms. For details visit [email or web address].   Also download the MyChart app! Go to the app store, search "MyChart", open the app, select [place], and log in with your MyChart username and password.

## 2023-10-21 NOTE — Progress Notes (Signed)
Palliative Medicine [facility]  Telephone:(336) [phone] [phone]   Name: [name] [name] Date: 10/21/2023 MRN: [id number]  DOB: 02/14/1941  Patient Care Team: Lorenda Ishihara, MD as PCP - General (Internal Medicine) Tessa Lerner, DO as PCP - Cardiology (Cardiology) Tat, Octaviano Batty, DO as Consulting Physician (Neurology) Earna Coder, MD as Consulting Physician (Hematology and Oncology) Fritzi Mandes, MD as Consulting Physician (General Surgery) Malachy Mood, MD as Consulting Physician (Hematology and Oncology) Josephine Igo, DO as Consulting Physician (Pulmonary Disease) Pickenpack-Cousar, Arty Baumgartner, NP as Nurse Practitioner (Hospice and Palliative Medicine)    INTERVAL HISTORY: [name] [name] is a 83 y.o. 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 [name] [name]' history also includes  pancreatic cancer (09/2021) as well as parkinson's disease. Palliative ask to see for symptom management and goals of care.   SOCIAL HISTORY:     reports that he quit smoking about 13 years ago. His smoking use included cigars. He has never used smokeless tobacco. He reports that he does not currently use alcohol after a past usage of about 4.0 standard drinks of alcohol per week. He reports that he does not use drugs.  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  MEDICATIONS:   Current Outpatient Medications  Medication Sig Dispense Refill   acetaminophen (TYLENOL) 500 MG tablet Take 1 tablet (500 mg total) by mouth every 6 (six) hours as needed for moderate pain or mild pain. (Patient taking differently: Take 500 mg by mouth as needed for moderate pain (pain score 4-6) or mild pain (pain score 1-3).) 30 tablet 0   ammonium lactate (LAC-HYDRIN) 12 % lotion Apply 1 Application topically as needed.     bisacodyl (DULCOLAX) 10 MG suppository Place 1 suppository (10 mg total) rectally daily as needed for moderate constipation. 12 suppository 0   carbidopa-levodopa (SINEMET CR) 50-200 MG tablet TAKE 1 TABLET AT BEDTIME 90 tablet 3   carbidopa-levodopa (SINEMET IR) 25-100 MG tablet Take 3 tablets at 7 AM, 2 tablets at 11 AM, 3 tablets at 3 PM, 2 tablets at 7 PM 900 tablet 0   ciclopirox (PENLAC) 8 % solution Apply 1 Application topically at bedtime.     clonazePAM (KLONOPIN) 0.5 MG tablet Take 1 tablet (0.5 mg total) by mouth at bedtime. 5 tablet 0   cyanocobalamin (VITAMIN B12) 1000 MCG tablet Take 1,000 mcg by mouth daily.     diltiazem (CARDIZEM) 30 MG tablet Take 1 tablet (30 mg total) by mouth 2 (two) times daily. Do not take if systolic blood pressure (top number) is less than 100, or if pulse if less than 55. 180 tablet 3   ELIQUIS 5 MG TABS tablet TAKE 1 TABLET TWICE A DAY 120 tablet 5   Glycerin-Hypromellose-PEG 400 (VISINE DRY EYE OP) Place 1 drop into both eyes  2 (two) times daily as needed (eye irritation).      ketoconazole (NIZORAL) 2 % shampoo Apply 1 application  topically 3 (three) times a week.     leuprolide, 6 Month, (ELIGARD) 45 MG injection Inject 45 mg into the skin every 6 (six) months.     lidocaine-prilocaine (EMLA) cream Apply to port site 1 to 2 hours prior to appointments/treatment 30 g 3   omeprazole (PRILOSEC) 40 MG capsule Take 1 capsule (40 mg total) by mouth daily. 30 capsule 3   ondansetron (ZOFRAN) 4 MG tablet Take 1 tablet (4 mg total) by  mouth every 6 (six) hours as needed for nausea. 30 tablet 3   oxyCODONE (OXY IR/ROXICODONE) 5 MG immediate release tablet Take 1 tablet (5 mg total) by mouth every 4 (four) hours as needed for severe pain (pain score 7-10). 90 tablet 0   PANCREAZE 37000-97300 units CPEP TAKE 2 CAPSULES WITH BREAKFAST, LUNCH, AND EVENING MEAL (WITH EACH MEAL) AND 1 CAPSULE WITH SNACK 600 capsule 5   polyethylene glycol powder (MIRALAX) 17 GM/SCOOP powder Take 17 g by mouth in the morning, at noon, and at bedtime.     prochlorperazine (COMPAZINE) 10 MG tablet Take 1 tablet (10 mg total) by mouth every 6 (six) hours as needed for nausea or vomiting. 30 tablet 2   rosuvastatin (CRESTOR) 10 MG tablet TAKE 1 TABLET DAILY 90 tablet 1   tiZANidine (ZANAFLEX) 2 MG tablet Take 1 tablet (2 mg total) by mouth every 8 (eight) hours as needed for muscle spasms. 10 tablet 0   triamcinolone cream (KENALOG) 0.1 % Apply 1 application topically 2 (two) times daily. (Patient taking differently: Apply 1 application  topically 2 (two) times daily as needed (itching).) 453.6 g 1   zoledronic acid (RECLAST) 5 MG/100ML SOLN injection Inject 5 mg into the vein See admin instructions. Once a year     No current facility-administered medications for this visit.   Facility-Administered Medications Ordered in Other Visits  Medication Dose Route Frequency Provider Last Rate Last Admin   0.9 %  sodium chloride infusion   Intravenous Continuous Malachy Mood, MD 10 mL/hr at 10/21/23 1513 New Bag at 10/21/23 1513   0.9 %  sodium chloride infusion   Intravenous Continuous Boscia, Heather E, NP 40 mL/hr at 10/21/23 1410 New Bag at 10/21/23 1410   gemcitabine (GEMZAR) 1,406 mg in sodium chloride 0.9 % 250 mL chemo infusion  800 mg/m2 (Treatment Plan Recorded) Intravenous Once Malachy Mood, MD 574 mL/hr at 10/21/23 1525 1,406 mg at 10/21/23 1525   heparin lock flush 100 unit/mL  500 Units Intracatheter Once PRN Malachy Mood, MD       sodium chloride flush (NS)  0.9 % injection 10 mL  10 mL Intracatheter PRN Malachy Mood, MD        VITAL SIGNS: There were no vitals taken for this visit. There were no vitals filed for this visit.  Estimated body mass index is 23.25 kg/m as calculated from the following:   Height as of an earlier encounter on 10/21/23: 5\' 5"  (1.651 m).   Weight as of an earlier encounter on 10/21/23: 139 lb 11.2 oz (63.4 kg).   PERFORMANCE STATUS (ECOG) : 1 - Symptomatic but completely ambulatory   Physical Exam General: NAD Cardiovascular: regular rate and rhythm Pulmonary: clear ant fields Abdomen: soft, nontender, + bowel sounds Extremities: no edema, no joint deformities Skin: no rashes Neurological: AAO x3  IMPRESSION:  I saw [name] [name] "  Ed" during his infusion. No acute distress. Tolerating treatment without difficulty. His appetite is good, and he sleeps well. No uncontrolled nausea or vomiting. Weight is stable at 139lbs. Appreciative of this. Continues to take things one day at a time. Occasional fatigue. Constipation is managed with Miralax.   He is currently managing his pain with oxycodone, taking it approximately three times a day, which he finds effective. No changes to regimen at this time. He also has Tizanidine on hand for muscle spasms.   We will continue to closely monitor and support.  I discussed the importance of continued conversation with family and their medical providers regarding overall plan of care and treatment options, ensuring decisions are within the context of the patients values and GOCs.  Assessment and Plan  Chronic Cancer Related Pain Controlled with Oxycodone as needed. Patient reports he is taking two to three times daily. No adjustments at this time.  -Continue current regimen. -Oxycodone 5mg  every 4 hours as needed -Tizanidine as needed  Constipation Mild, secondary to opioid use. Controlled with daily bowel regimen. -Continue current management of daily Miralax.   -Senna 2 tabs at bedtime if further concerns with constipation.   General Health Maintenance Good appetite and sleep. No nausea or vomiting. -Continue current management and report any changes. -I will plan to see patient back in 3-4 weeks.  Patient expressed understanding and was in agreement with this plan. He also understands that He can call the clinic at any time with any questions, concerns, or complaints.   Any controlled substances utilized were prescribed in the context of palliative care. PDMP has been reviewed.   Visit consisted of counseling and education dealing with the complex and emotionally intense issues of symptom management and palliative care in the setting of serious and potentially life-threatening illness.  Willette Alma, AGPCNP-BC  Palliative Medicine Team/[facility]

## 2023-10-22 ENCOUNTER — Encounter: Payer: Self-pay | Admitting: Hematology

## 2023-10-22 ENCOUNTER — Encounter: Payer: Self-pay | Admitting: Nurse Practitioner

## 2023-10-22 ENCOUNTER — Encounter: Payer: Self-pay | Admitting: Internal Medicine

## 2023-10-22 LAB — PROSTATE-SPECIFIC AG, SERUM (LABCORP): Prostate Specific Ag, Serum: <0.1 ng/mL (ref 0.0–4.0)

## 2023-10-22 LAB — CANCER ANTIGEN 19-9: CA 19-9: 535 U/mL — ABNORMAL HIGH (ref 0–35)

## 2023-10-25 DIAGNOSIS — M6281 Muscle weakness (generalized): Secondary | ICD-10-CM | POA: Diagnosis not present

## 2023-10-25 DIAGNOSIS — R2689 Other abnormalities of gait and mobility: Secondary | ICD-10-CM | POA: Diagnosis not present

## 2023-10-26 ENCOUNTER — Other Ambulatory Visit: Payer: Self-pay

## 2023-10-26 ENCOUNTER — Encounter (HOSPITAL_BASED_OUTPATIENT_CLINIC_OR_DEPARTMENT_OTHER): Payer: Self-pay | Admitting: Emergency Medicine

## 2023-10-26 ENCOUNTER — Emergency Department (HOSPITAL_BASED_OUTPATIENT_CLINIC_OR_DEPARTMENT_OTHER)
Admission: EM | Admit: 2023-10-26 | Discharge: 2023-10-26 | Disposition: A | Discharge status: Home / Self Care | Payer: Medicare Other | Attending: Emergency Medicine | Admitting: Emergency Medicine

## 2023-10-26 DIAGNOSIS — I4891 Unspecified atrial fibrillation: Secondary | ICD-10-CM | POA: Diagnosis not present

## 2023-10-26 DIAGNOSIS — E860 Dehydration: Secondary | ICD-10-CM | POA: Insufficient documentation

## 2023-10-26 DIAGNOSIS — Z8507 Personal history of malignant neoplasm of pancreas: Secondary | ICD-10-CM | POA: Insufficient documentation

## 2023-10-26 DIAGNOSIS — E861 Hypovolemia: Secondary | ICD-10-CM | POA: Diagnosis not present

## 2023-10-26 DIAGNOSIS — G20B2 Parkinson's disease with dyskinesia, with fluctuations: Secondary | ICD-10-CM

## 2023-10-26 DIAGNOSIS — I959 Hypotension, unspecified: Secondary | ICD-10-CM | POA: Diagnosis not present

## 2023-10-26 DIAGNOSIS — Z7901 Long term (current) use of anticoagulants: Secondary | ICD-10-CM | POA: Insufficient documentation

## 2023-10-26 DIAGNOSIS — K121 Other forms of stomatitis: Secondary | ICD-10-CM | POA: Diagnosis not present

## 2023-10-26 DIAGNOSIS — G20A1 Parkinson's disease without dyskinesia, without mention of fluctuations: Secondary | ICD-10-CM | POA: Diagnosis not present

## 2023-10-26 DIAGNOSIS — Z8546 Personal history of malignant neoplasm of prostate: Secondary | ICD-10-CM | POA: Insufficient documentation

## 2023-10-26 DIAGNOSIS — D696 Thrombocytopenia, unspecified: Secondary | ICD-10-CM | POA: Diagnosis not present

## 2023-10-26 LAB — CBC WITH DIFFERENTIAL/PLATELET
Abs Immature Granulocytes: 0.01 10*3/uL (ref 0.00–0.07)
Basophils Absolute: 0.1 10*3/uL (ref 0.0–0.1)
Basophils Relative: 1 %
Eosinophils Absolute: 0.2 10*3/uL (ref 0.0–0.5)
Eosinophils Relative: 4 %
HCT: 31 % — ABNORMAL LOW (ref 39.0–52.0)
Hemoglobin: 9.7 g/dL — ABNORMAL LOW (ref 13.0–17.0)
Immature Granulocytes: 0 %
Lymphocytes Relative: 16 %
Lymphs Abs: 0.9 10*3/uL (ref 0.7–4.0)
MCH: 30.8 pg (ref 26.0–34.0)
MCHC: 31.3 g/dL (ref 30.0–36.0)
MCV: 98.4 fL (ref 80.0–100.0)
Monocytes Absolute: 0.1 10*3/uL (ref 0.1–1.0)
Monocytes Relative: 2 %
Neutro Abs: 4.3 10*3/uL (ref 1.7–7.7)
Neutrophils Relative %: 77 %
Platelets: 132 10*3/uL — ABNORMAL LOW (ref 150–400)
RBC: 3.15 MIL/uL — ABNORMAL LOW (ref 4.22–5.81)
RDW: 16.3 % — ABNORMAL HIGH (ref 11.5–15.5)
WBC: 5.6 10*3/uL (ref 4.0–10.5)
nRBC: 0 % (ref 0.0–0.2)

## 2023-10-26 LAB — BASIC METABOLIC PANEL
Anion gap: 8 (ref 5–15)
BUN: 22 mg/dL (ref 8–23)
CO2: 25 mmol/L (ref 22–32)
Calcium: 9 mg/dL (ref 8.9–10.3)
Chloride: 105 mmol/L (ref 98–111)
Creatinine, Ser: 0.82 mg/dL (ref 0.61–1.24)
GFR, Estimated: >60 mL/min (ref >60)
Glucose, Bld: 93 mg/dL (ref 70–99)
Potassium: 4.3 mmol/L (ref 3.5–5.1)
Sodium: 138 mmol/L (ref 135–145)

## 2023-10-26 LAB — HEPATIC FUNCTION PANEL
ALT: 5 U/L (ref 0–44)
AST: 29 U/L (ref 15–41)
Albumin: 3.6 g/dL (ref 3.5–5.0)
Alkaline Phosphatase: 59 U/L (ref 38–126)
Bilirubin, Direct: 0.4 mg/dL — ABNORMAL HIGH (ref 0.0–0.2)
Indirect Bilirubin: 1.2 mg/dL — ABNORMAL HIGH (ref 0.3–0.9)
Total Bilirubin: 1.6 mg/dL — ABNORMAL HIGH (ref 0.0–1.2)
Total Protein: 6.5 g/dL (ref 6.5–8.1)

## 2023-10-26 LAB — LACTIC ACID, PLASMA: Lactic Acid, Venous: 0.7 mmol/L (ref 0.5–1.9)

## 2023-10-26 MED ORDER — LIDOCAINE VISCOUS HCL 2 % MT SOLN
15.0000 mL | Freq: Four times a day (QID) | OROMUCOSAL | 2 refills | Status: AC | PRN
Start: 2023-10-26 — End: 2023-11-25

## 2023-10-26 MED ORDER — LACTATED RINGERS IV BOLUS
1000.0000 mL | Freq: Once | INTRAVENOUS | Status: AC
  Administered 2023-10-26: 1000 mL via INTRAVENOUS

## 2023-10-26 MED ORDER — HEPARIN SOD (PORK) LOCK FLUSH 100 UNIT/ML IV SOLN
500.0000 [IU] | Freq: Once | INTRAVENOUS | Status: AC
Start: 2023-10-26 — End: 2023-10-26
  Administered 2023-10-26: 500 [IU]
  Filled 2023-10-26: qty 5

## 2023-10-26 MED ORDER — CARBIDOPA-LEVODOPA 25-100 MG PO TABS: ORAL_TABLET | ORAL | 0 refills | Status: DC

## 2023-10-26 NOTE — ED Triage Notes (Signed)
States BP was 60s/40s at home. Currently taking chemo for pancreatic cancer. Also endorses "afib for a month"

## 2023-10-26 NOTE — ED Provider Notes (Signed)
[place] EMERGENCY DEPARTMENT AT [facility] Provider Note   CSN: [id number] Arrival date & time: 10/26/23  8295     History  Chief Complaint  Patient presents with   Hypotension    [name] [name] is a 83 y.o. male.  83 year old male with history of atrial fibrillation on Eliquis, metastatic prostate cancer, pancreatic cancer, and Parkinson's disease who presents to the emergency department for hypotension.  Patient brought in by his caretaker says that today was hypotensive to 67/53.  Rechecked his blood pressure several times and it stayed low.  Denies any dizziness, chest pain, shortness of breath, fever, cough, nausea, vomiting, or diarrhea.  Says his appetite has been normal recently.  No new medication changes.  Does not have a history of hypotension.       Home Medications Prior to Admission medications   Medication Sig Start Date End Date Taking? Authorizing Provider  lidocaine (XYLOCAINE) 2 % solution Use as directed 15 mLs in the mouth or throat every 6 (six) hours as needed for mouth pain. 10/26/23 11/25/23 Yes Rondel Baton, MD  acetaminophen (TYLENOL) 500 MG tablet Take 1 tablet (500 mg total) by mouth every 6 (six) hours as needed for moderate pain or mild pain. Patient taking differently: Take 500 mg by mouth as needed for moderate pain (pain score 4-6) or mild pain (pain score 1-3). 04/18/22   Mansouraty, Netty Starring., MD  ammonium lactate (LAC-HYDRIN) 12 % lotion Apply 1 Application topically as needed. 06/22/23   [provider]  bisacodyl (DULCOLAX) 10 MG suppository Place 1 suppository (10 mg total) rectally daily as needed for moderate constipation. 02/09/23   Marguerita Merles Latif, DO  carbidopa-levodopa (SINEMET CR) 50-200 MG tablet TAKE 1 TABLET AT BEDTIME 10/18/23   Tat, Rebecca S, DO  carbidopa-levodopa (SINEMET IR) 25-100 MG tablet Take 3 tablets at 7 AM, 2 tablets at 11 AM, 3 tablets at 3 PM, 2 tablets at 7 PM 10/26/23   Tat, Rebecca S, DO   ciclopirox (PENLAC) 8 % solution Apply 1 Application topically at bedtime.  09/29/24  [provider]  clonazePAM (KLONOPIN) 0.5 MG tablet Take 1 tablet (0.5 mg total) by mouth at bedtime. 02/09/23   Marguerita Merles Latif, DO  cyanocobalamin (VITAMIN B12) 1000 MCG tablet Take 1,000 mcg by mouth daily.    [provider]  diltiazem (CARDIZEM) 30 MG tablet Take 1 tablet (30 mg total) by mouth 2 (two) times daily. Do not take if systolic blood pressure (top number) is less than 100, or if pulse if less than 55. 10/18/23   Tolia, Sunit, DO  ELIQUIS 5 MG TABS tablet TAKE 1 TABLET TWICE A DAY 03/12/23   Tolia, Sunit, DO  Glycerin-Hypromellose-PEG 400 (VISINE DRY EYE OP) Place 1 drop into both eyes 2 (two) times daily as needed (eye irritation).     [provider]  ketoconazole (NIZORAL) 2 % shampoo Apply 1 application  topically 3 (three) times a week. 02/15/19   [provider]  leuprolide, 6 Month, (ELIGARD) 45 MG injection Inject 45 mg into the skin every 6 (six) months.    [provider]  lidocaine-prilocaine (EMLA) cream Apply to port site 1 to 2 hours prior to appointments/treatment 08/13/23   Carlean Jews, NP  omeprazole (PRILOSEC) 40 MG capsule Take 1 capsule (40 mg total) by mouth daily. 09/23/23   Pollyann Samples, NP  ondansetron (ZOFRAN) 4 MG tablet Take 1 tablet (4 mg total) by mouth every 6 (six)  hours as needed for nausea. 09/23/23   Pollyann Samples, NP  oxyCODONE (OXY IR/ROXICODONE) 5 MG immediate release tablet Take 1 tablet (5 mg total) by mouth every 4 (four) hours as needed for severe pain (pain score 7-10). 10/07/23   Pickenpack-Cousar, Arty Baumgartner, NP  PANCREAZE [phone] units CPEP TAKE 2 CAPSULES WITH BREAKFAST, LUNCH, AND EVENING MEAL (WITH EACH MEAL) AND 1 CAPSULE WITH SNACK 03/12/23   Arnaldo Natal, NP  polyethylene glycol powder (MIRALAX) 17 GM/SCOOP powder Take 17 g by mouth in the morning, at noon, and at bedtime.    [provider]  prochlorperazine (COMPAZINE) 10 MG tablet Take 1 tablet (10 mg total) by mouth every 6 (six) hours as needed for nausea or vomiting. 09/23/23   Pollyann Samples, NP  rosuvastatin (CRESTOR) 10 MG tablet TAKE 1 TABLET DAILY 07/08/23   Tolia, Sunit, DO  tiZANidine (ZANAFLEX) 2 MG tablet Take 1 tablet (2 mg total) by mouth every 8 (eight) hours as needed for muscle spasms. 02/09/23   Marguerita Merles Latif, DO  triamcinolone cream (KENALOG) 0.1 % Apply 1 application topically 2 (two) times daily. Patient taking differently: Apply 1 application  topically 2 (two) times daily as needed (itching). 01/16/21   Duanne Limerick, MD  zoledronic acid (RECLAST) 5 MG/100ML SOLN injection Inject 5 mg into the vein See admin instructions. Once a year    [provider]      Allergies    Influenza vaccines and Influenza virus vaccine    Review of Systems   Review of Systems  Physical Exam Updated Vital Signs BP 94/79   Pulse (!) 106   Temp 97.9 F (36.6 C) (Oral)   Resp 18   Ht 5\' 5"  (1.651 m)   Wt 63 kg   SpO2 95%   BMI 23.11 kg/m  Physical Exam Vitals and nursing note reviewed.  Constitutional:      General: He is not in acute distress.    Appearance: He is well-developed.  HENT:     Head: Normocephalic and atraumatic.     Right Ear: External ear normal.     Left Ear: External ear normal.     Nose: Nose normal.  Eyes:     Extraocular Movements: Extraocular movements intact.     Conjunctiva/sclera: Conjunctivae normal.     Pupils: Pupils are equal, round, and reactive to light.  Cardiovascular:     Rate and Rhythm: Normal rate and regular rhythm.     Heart sounds: Normal heart sounds.  Pulmonary:     Effort: Pulmonary effort is normal. No respiratory distress.     Breath sounds: Normal breath sounds.  Abdominal:     General: There is no distension.     Palpations: Abdomen is soft. There is no mass.     Tenderness: There is no abdominal tenderness. There is no  guarding.  Musculoskeletal:     Cervical back: Normal range of motion and neck supple.     Right lower leg: No edema.     Left lower leg: No edema.  Skin:    General: Skin is warm and dry.     Capillary Refill: Capillary refill takes more than 3 seconds.  Neurological:     Mental Status: He is alert. Mental status is at baseline.  Psychiatric:        Mood and Affect: Mood normal.        Behavior: Behavior normal.     ED Results / Procedures /  Treatments   Labs (all labs ordered are listed, but only abnormal results are displayed) Labs Reviewed  CBC WITH DIFFERENTIAL/PLATELET - Abnormal; Notable for the following components:      Result Value   RBC 3.15 (*)    Hemoglobin 9.7 (*)    HCT 31.0 (*)    RDW 16.3 (*)    Platelets 132 (*)    All other components within normal limits  HEPATIC FUNCTION PANEL - Abnormal; Notable for the following components:   Total Bilirubin 1.6 (*)    Bilirubin, Direct 0.4 (*)    Indirect Bilirubin 1.2 (*)    All other components within normal limits  BASIC METABOLIC PANEL  LACTIC ACID, PLASMA    EKG EKG Interpretation Date/Time:  Tuesday October 26 2023 10:14:41 EST Ventricular Rate:  105 PR Interval:    QRS Duration:  114 QT Interval:  392 QTC Calculation: 518 R Axis:   -34  Text Interpretation: Atrial flutter with rapid ventricular response Left axis deviation Incomplete right bundle branch block Septal infarct , age undetermined ST & T wave abnormality, consider inferior ischemia Abnormal ECG When compared with ECG of 24-Sep-2023 10:37, Septal infarct is now Present Confirmed by Vonita Moss [phone]) on 10/26/2023 2:03:27 PM  Radiology No results found.  Procedures Procedures    Medications Ordered in ED Medications  lactated ringers bolus 1,000 mL (0 mLs Intravenous Stopped 10/26/23 1247)  heparin lock flush 100 unit/mL (500 Units Intracatheter Given 10/26/23 1320)    ED Course/ Medical Decision Making/ A&P Clinical Course  as of 10/26/23 2016  Tue Oct 26, 2023  1149 Hemoglobin(!): 9.7 Baseline of 10 [RP]  1149 Platelets(!): 132 Baseline WNL [RP]    Clinical Course User Index [RP] Rondel Baton, MD                                 Medical Decision Making Amount and/or Complexity of Data Reviewed Labs: ordered. Decision-making details documented in ED Course.  Risk Prescription drug management.   [name] [name] is a 83 y.o. male with comorbidities that complicate the patient evaluation including atrial fibrillation on Eliquis, metastatic prostate cancer, pancreatic cancer, and Parkinson's disease who presents to the emergency department for hypotension.   Initial Ddx:  Shock, hypotension, hypovolemia, dysautonomia, medication side effect  MDM/Course:  Patient presents emergency department with low blood pressure.  Looks like he has soft blood pressures at baseline in the 100 systolic.  He is overall well-appearing.  Was asymptomatic when this was happening.  Does have a history of Parkinson's which predisposes him to dysautonomia.  Was given some IV fluids and his blood pressure normalized he was well-appearing on repeat evaluation.  Did later report that he had perhaps been eating less than usual which means he could have been dehydrated causing his hypotension.  Lab work without evidence of AKI or electrolyte abnormality.  Lactic acid WNL.  Given his overall clinical appearance feel that shock is highly unlikely.  May have been related to his decreased p.o. intake and Parkinson's.  Was also notified that he was thrombocytopenic with the related chemotherapy.  Will have him follow-up with his primary doctor in several days.  Return precautions discussed prior to discharge.  They are also asking about some lesions in his mouth which appears to be consistent with stomatitis given lidocaine swish to take at home for the pain.  This patient presents to the ED for concern  of complaints listed in HPI, this  involves an extensive number of treatment options, and is a complaint that carries with it a high risk of complications and morbidity. Disposition including potential need for admission considered.   Dispo: DC Home. Return precautions discussed including, but not limited to, those listed in the AVS. Allowed pt time to ask questions which were answered fully prior to dc.  Additional history obtained from  caretaker Records reviewed Outpatient Clinic Notes The following labs were independently interpreted: Chemistry and show no acute abnormality I personally reviewed and interpreted cardiac monitoring: normal sinus rhythm  I personally reviewed and interpreted the pt's EKG: see above for interpretation  I have reviewed the patients home medications and made adjustments as needed Social Determinants of health:  Elderly  Portions of this note were generated with Scientist, clinical (histocompatibility and immunogenetics). Dictation errors may occur despite best attempts at proofreading.     Final Clinical Impression(s) / ED Diagnoses Final diagnoses:  Hypotension, unspecified hypotension type  Dehydration  Stomatitis  Thrombocytopenia (HCC)    Rx / DC Orders ED Discharge Orders          Ordered    lidocaine (XYLOCAINE) 2 % solution  Every 6 hours PRN        10/26/23 1301              Rondel Baton, MD 10/26/23 2016

## 2023-10-26 NOTE — ED Notes (Signed)
 Discharge paperwork given and verbally understood.

## 2023-10-26 NOTE — Discharge Instructions (Signed)
You were seen for your low blood pressure in the emergency department. It was likely from your parkinson's and dehydration  At home, please stay well hydrated. Use the oral rinse lidocaine for your mouth sores.    Check your MyChart online for the results of any tests that had not resulted by the time you left the emergency department.   Follow-up with your primary doctor in 2-3 days regarding your visit.    Return immediately to the emergency department if you experience any of the following: recurrent low blood pressures, lightheadedness, fainting, or any other concerning symptoms.    Thank you for visiting our Emergency Department. It was a pleasure taking care of you today.

## 2023-10-27 DIAGNOSIS — I959 Hypotension, unspecified: Secondary | ICD-10-CM | POA: Diagnosis not present

## 2023-10-28 NOTE — Progress Notes (Signed)
 Assessment/Plan:   1.  Parkinsons Disease  -Discussed how to take his medication properly and on time.  Discussed alarm mechanisms.  Told him to take carbidopa/levodopa 25/100, 3 tablets at 7 AM, 2 tablets at 11 AM, 3 tablets at 3 PM, 2 tablets at 7 PM   -Continue carbidopa/levodopa 50/200 CR at bedtime  -he is in PT at [facility]  -Patient has Inbrija and can use that as needed.  He tried it for first AM on and didn't find it helpful but plans to try it when he goes to [place] for tremor.  2.  RBD  -on klonopin from pcp  3.  OSAS  -On BiPAP and followed by [facility] neurology.  4.  Atrial flutter  -On Eliquis  -On diltiazem and uses prn right now  5.  Low blood pressure  -  Likely due to combination of diltiazem and Parkinson's disease.  Following with cardiology in [place] now.    -increases water intake  6.  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  Chemotherapy regimen recently changed due to fatigue   -Now in palliative care.  7.  Compression fractures.  -Has had several, with kyphoplasty at the L1 level.  Following with pain management.  On oxycodone.  8.  Dysphagia  -order MBE.   -told them to make sure oncology aware and they have  -if mbe neg, he will f/u with [facility], where he is already seen  Subjective:   [name] [name] was seen today in follow up for Parkinsons disease.  My previous records were reviewed prior to todays visit as well as outside records available to me.  He is with his family (son/daughter) who supplements hx. patient is being treated for pancreatic cancer.  His regimen was changed due to fatigue.  They did email me recently stating patient was having some dysphagia and wanted to discuss that at today's visit.  Thus far, it has not been evaluated.  Its solid, liquids, and pills.  Separately, patient was in emergency room a few days ago with low blood pressure.  He has had a history of this.  His hemoglobin was 9.2.   They felt it was attributable to his Parkinson's and low p.o. intake.  Patient is on Cardizem for rate control for a flutter.  He reports he takes it as needed.  Current prescribed movement disorder medications: carbidopa/levodopa 25/100, 3 tablets at 7 AM/2 at 11 AM/3 at 3 PM/2 tablet at 7 PM (increased) - admits that he isn't taking as directed.   carbidopa/levodopa 50/200 CR at bed  Clonazepam 0.5 mg at bedtime  Inbrija (started last visit)     PREVIOUS MEDICATIONS: opicapone (took 2 weeks of samples and d/c)    ALLERGIES:   Allergies  Allergen Reactions   Influenza Vaccines Anaphylaxis    Flu shot: 1974 anaphylaxis. 2nd time swollen arm   Influenza Virus Vaccine Other (See Comments)    CURRENT MEDICATIONS:  Outpatient Encounter Medications as of 11/01/2023  Medication Sig   acetaminophen (TYLENOL) 500 MG tablet Take 1 tablet (500 mg total) by mouth every 6 (six) hours as needed for moderate pain or mild pain. (Patient taking differently: Take 500 mg by mouth as needed for moderate pain (pain score 4-6) or mild pain (pain score 1-3).)   ammonium lactate (LAC-HYDRIN) 12 % lotion Apply 1 Application topically as needed.   bisacodyl (DULCOLAX) 10 MG suppository Place 1 suppository (10 mg total) rectally daily as  needed for moderate constipation.   carbidopa-levodopa (SINEMET CR) 50-200 MG tablet TAKE 1 TABLET AT BEDTIME   carbidopa-levodopa (SINEMET IR) 25-100 MG tablet Take 3 tablets at 7 AM, 2 tablets at 11 AM, 3 tablets at 3 PM, 2 tablets at 7 PM   ciclopirox (PENLAC) 8 % solution Apply 1 Application topically at bedtime.   clonazePAM (KLONOPIN) 0.5 MG tablet Take 1 tablet (0.5 mg total) by mouth at bedtime.   cyanocobalamin (VITAMIN B12) 1000 MCG tablet Take 1,000 mcg by mouth daily.   diltiazem (CARDIZEM) 30 MG tablet Take 1 tablet (30 mg total) by mouth 2 (two) times daily. Do not take if systolic blood pressure (top number) is less than 100, or if pulse if less than 55. (Patient  taking differently: Take 30 mg by mouth as needed. Do not take if systolic blood pressure (top number) is less than 100, or if pulse if less than 55.)   ELIQUIS 5 MG TABS tablet TAKE 1 TABLET TWICE A DAY   Glycerin-Hypromellose-PEG 400 (VISINE DRY EYE OP) Place 1 drop into both eyes 2 (two) times daily as needed (eye irritation).    ketoconazole (NIZORAL) 2 % shampoo Apply 1 application  topically 3 (three) times a week.   leuprolide, 6 Month, (ELIGARD) 45 MG injection Inject 45 mg into the skin every 6 (six) months.   lidocaine (XYLOCAINE) 2 % solution Use as directed 15 mLs in the mouth or throat every 6 (six) hours as needed for mouth pain.   lidocaine-prilocaine (EMLA) cream Apply to port site 1 to 2 hours prior to appointments/treatment   omeprazole (PRILOSEC) 40 MG capsule Take 1 capsule (40 mg total) by mouth daily.   ondansetron (ZOFRAN) 4 MG tablet Take 1 tablet (4 mg total) by mouth every 6 (six) hours as needed for nausea.   oxyCODONE (OXY IR/ROXICODONE) 5 MG immediate release tablet Take 1 tablet (5 mg total) by mouth every 4 (four) hours as needed for severe pain (pain score 7-10).   PANCREAZE 37000-97300 units CPEP TAKE 2 CAPSULES WITH BREAKFAST, LUNCH, AND EVENING MEAL (WITH EACH MEAL) AND 1 CAPSULE WITH SNACK   polyethylene glycol powder (MIRALAX) 17 GM/SCOOP powder Take 17 g by mouth in the morning, at noon, and at bedtime.   prochlorperazine (COMPAZINE) 10 MG tablet Take 1 tablet (10 mg total) by mouth every 6 (six) hours as needed for nausea or vomiting.   rosuvastatin (CRESTOR) 10 MG tablet TAKE 1 TABLET DAILY   tiZANidine (ZANAFLEX) 2 MG tablet Take 1 tablet (2 mg total) by mouth every 8 (eight) hours as needed for muscle spasms.   triamcinolone cream (KENALOG) 0.1 % Apply 1 application topically 2 (two) times daily. (Patient taking differently: Apply 1 application  topically 2 (two) times daily as needed (itching).)   zoledronic acid (RECLAST) 5 MG/100ML SOLN injection Inject 5  mg into the vein See admin instructions. Once a year   No facility-administered encounter medications on file as of 11/01/2023.    Objective:   PHYSICAL EXAMINATION:    VITALS:   Vitals:   11/01/23 0814  BP: 118/78  Pulse: 90  SpO2: 98%  Weight: 138 lb (62.6 kg)  Height: 5\' 6"  (1.676 m)     Wt Readings from Last 3 Encounters:  11/01/23 138 lb (62.6 kg)  10/26/23 138 lb 14.2 oz (63 kg)  10/21/23 139 lb 11.2 oz (63.4 kg)    GEN:  The patient appears stated age and is in NAD.  Pt is  slightly jaundiced HEENT:  Normocephalic, atraumatic.  The mucous membranes are moist. The superficial temporal arteries are without ropiness or tenderness. CV: regular.  Irreg. Lungs:  CTAB Neck: no bruits   Neurological examination:  Orientation: The patient is alert and oriented x3. Cranial nerves: There is good facial symmetry with facial hypomimia. The speech is fluent and clear. Soft palate rises symmetrically and there is no tongue deviation. Hearing is intact to conversational tone. Sensation: Sensation is intact to light touch throughout Motor: Strength is at least antigravity x4.  Movement examination: Tone: There is nl tone in the UE/LE Abnormal movements: there is L leg dyskinesia Coordination:  There is mild decremation with RAM's, with any form of RAMS, including alternating supination and pronation of the forearm, hand opening and closing, finger taps, heel taps and toe taps, Gait and Station: Pushes off to arise.  He is given his rollator.  He is forward flexed and festinates  I have reviewed and interpreted the following labs independently    Chemistry      Component Value Date/Time   NA 138 10/26/2023 1032   NA 139 12/04/2021 1359   K 4.3 10/26/2023 1032   CL 105 10/26/2023 1032   CO2 25 10/26/2023 1032   BUN 22 10/26/2023 1032   BUN 20 12/04/2021 1359   CREATININE 0.82 10/26/2023 1032   CREATININE 0.94 10/21/2023 1233      Component Value Date/Time   CALCIUM 9.0  10/26/2023 1032   ALKPHOS 59 10/26/2023 1032   AST 29 10/26/2023 1032   AST 31 10/21/2023 1233   ALT 5 10/26/2023 1032   ALT <5 10/21/2023 1233   BILITOT 1.6 (H) 10/26/2023 1032   BILITOT 1.2 10/21/2023 1233       Lab Results  Component Value Date   WBC 5.6 10/26/2023   HGB 9.7 (L) 10/26/2023   HCT 31.0 (L) 10/26/2023   MCV 98.4 10/26/2023   PLT 132 (L) 10/26/2023    Lab Results  Component Value Date   TSH 1.217 08/16/2019   Total time spent on today's visit was 40 minutes, including both face-to-face time and nonface-to-face time.  Time included that spent on review of records (prior notes available to me/labs/imaging if pertinent), discussing treatment and goals, answering patient's questions and coordinating care.   Cc:  Lorenda Ishihara, MD

## 2023-10-29 DIAGNOSIS — M6281 Muscle weakness (generalized): Secondary | ICD-10-CM | POA: Diagnosis not present

## 2023-10-29 DIAGNOSIS — R2689 Other abnormalities of gait and mobility: Secondary | ICD-10-CM | POA: Diagnosis not present

## 2023-11-01 ENCOUNTER — Other Ambulatory Visit (HOSPITAL_COMMUNITY): Payer: Self-pay | Admitting: Neurology

## 2023-11-01 ENCOUNTER — Ambulatory Visit (INDEPENDENT_AMBULATORY_CARE_PROVIDER_SITE_OTHER): Payer: Medicare Other | Admitting: Neurology

## 2023-11-01 ENCOUNTER — Encounter: Payer: Self-pay | Admitting: Neurology

## 2023-11-01 ENCOUNTER — Telehealth: Payer: Self-pay

## 2023-11-01 VITALS — BP 118/78 | HR 90 | Ht 66.0 in | Wt 138.0 lb

## 2023-11-01 DIAGNOSIS — G20B2 Parkinson's disease with dyskinesia, with fluctuations: Secondary | ICD-10-CM | POA: Diagnosis not present

## 2023-11-01 DIAGNOSIS — R2689 Other abnormalities of gait and mobility: Secondary | ICD-10-CM | POA: Diagnosis not present

## 2023-11-01 DIAGNOSIS — R1310 Dysphagia, unspecified: Secondary | ICD-10-CM

## 2023-11-01 DIAGNOSIS — R1319 Other dysphagia: Secondary | ICD-10-CM

## 2023-11-01 DIAGNOSIS — R059 Cough, unspecified: Secondary | ICD-10-CM

## 2023-11-01 DIAGNOSIS — M6281 Muscle weakness (generalized): Secondary | ICD-10-CM | POA: Diagnosis not present

## 2023-11-03 ENCOUNTER — Other Ambulatory Visit: Payer: Self-pay | Admitting: Nurse Practitioner

## 2023-11-03 DIAGNOSIS — K120 Recurrent oral aphthae: Secondary | ICD-10-CM | POA: Diagnosis not present

## 2023-11-03 DIAGNOSIS — E46 Unspecified protein-calorie malnutrition: Secondary | ICD-10-CM | POA: Diagnosis not present

## 2023-11-03 DIAGNOSIS — I959 Hypotension, unspecified: Secondary | ICD-10-CM | POA: Diagnosis not present

## 2023-11-03 DIAGNOSIS — M6281 Muscle weakness (generalized): Secondary | ICD-10-CM | POA: Diagnosis not present

## 2023-11-03 DIAGNOSIS — R2689 Other abnormalities of gait and mobility: Secondary | ICD-10-CM | POA: Diagnosis not present

## 2023-11-03 DIAGNOSIS — R54 Age-related physical debility: Secondary | ICD-10-CM | POA: Diagnosis not present

## 2023-11-03 DIAGNOSIS — C259 Malignant neoplasm of pancreas, unspecified: Secondary | ICD-10-CM | POA: Diagnosis not present

## 2023-11-03 DIAGNOSIS — M810 Age-related osteoporosis without current pathological fracture: Secondary | ICD-10-CM | POA: Diagnosis not present

## 2023-11-03 MED ORDER — OXYCODONE HCL 5 MG PO TABS: 5.0000 mg | ORAL_TABLET | ORAL | 0 refills | Status: DC | PRN

## 2023-11-04 ENCOUNTER — Inpatient Hospital Stay (HOSPITAL_BASED_OUTPATIENT_CLINIC_OR_DEPARTMENT_OTHER): Payer: Medicare Other | Admitting: Hematology

## 2023-11-04 ENCOUNTER — Telehealth: Payer: Self-pay | Admitting: Internal Medicine

## 2023-11-04 ENCOUNTER — Inpatient Hospital Stay: Payer: Medicare Other

## 2023-11-04 ENCOUNTER — Encounter: Payer: Self-pay | Admitting: Hematology

## 2023-11-04 VITALS — BP 97/78 | HR 116 | Temp 97.6°F | Resp 17 | Wt 141.2 lb

## 2023-11-04 VITALS — HR 96

## 2023-11-04 DIAGNOSIS — C250 Malignant neoplasm of head of pancreas: Secondary | ICD-10-CM

## 2023-11-04 DIAGNOSIS — C259 Malignant neoplasm of pancreas, unspecified: Secondary | ICD-10-CM

## 2023-11-04 DIAGNOSIS — Z95828 Presence of other vascular implants and grafts: Secondary | ICD-10-CM

## 2023-11-04 DIAGNOSIS — Z5111 Encounter for antineoplastic chemotherapy: Secondary | ICD-10-CM | POA: Diagnosis not present

## 2023-11-04 LAB — CMP (CANCER CENTER ONLY)
ALT: 5 U/L (ref 0–44)
AST: 32 U/L (ref 15–41)
Albumin: 3.5 g/dL (ref 3.5–5.0)
Alkaline Phosphatase: 67 U/L (ref 38–126)
Anion gap: 3 — ABNORMAL LOW (ref 5–15)
BUN: 15 mg/dL (ref 8–23)
CO2: 28 mmol/L (ref 22–32)
Calcium: 9.4 mg/dL (ref 8.9–10.3)
Chloride: 107 mmol/L (ref 98–111)
Creatinine: 1.04 mg/dL (ref 0.61–1.24)
GFR, Estimated: >60 mL/min (ref >60)
Glucose, Bld: 99 mg/dL (ref 70–99)
Potassium: 4.5 mmol/L (ref 3.5–5.1)
Sodium: 138 mmol/L (ref 135–145)
Total Bilirubin: 0.9 mg/dL (ref 0.0–1.2)
Total Protein: 6.3 g/dL — ABNORMAL LOW (ref 6.5–8.1)

## 2023-11-04 LAB — CBC WITH DIFFERENTIAL (CANCER CENTER ONLY)
Abs Immature Granulocytes: 0.02 10*3/uL (ref 0.00–0.07)
Basophils Absolute: 0 10*3/uL (ref 0.0–0.1)
Basophils Relative: 1 %
Eosinophils Absolute: 0.3 10*3/uL (ref 0.0–0.5)
Eosinophils Relative: 4 %
HCT: 31.8 % — ABNORMAL LOW (ref 39.0–52.0)
Hemoglobin: 10 g/dL — ABNORMAL LOW (ref 13.0–17.0)
Immature Granulocytes: 0 %
Lymphocytes Relative: 19 %
Lymphs Abs: 1.1 10*3/uL (ref 0.7–4.0)
MCH: 31 pg (ref 26.0–34.0)
MCHC: 31.4 g/dL (ref 30.0–36.0)
MCV: 98.5 fL (ref 80.0–100.0)
Monocytes Absolute: 0.6 10*3/uL (ref 0.1–1.0)
Monocytes Relative: 10 %
Neutro Abs: 3.9 10*3/uL (ref 1.7–7.7)
Neutrophils Relative %: 66 %
Platelet Count: 154 10*3/uL (ref 150–400)
RBC: 3.23 MIL/uL — ABNORMAL LOW (ref 4.22–5.81)
RDW: 17 % — ABNORMAL HIGH (ref 11.5–15.5)
WBC Count: 6 10*3/uL (ref 4.0–10.5)
nRBC: 0 % (ref 0.0–0.2)

## 2023-11-04 MED ORDER — SODIUM CHLORIDE 0.9 % IV SOLN: INTRAVENOUS | Status: DC

## 2023-11-04 MED ORDER — ONDANSETRON HCL 8 MG PO TABS
8.0000 mg | ORAL_TABLET | Freq: Three times a day (TID) | ORAL | 1 refills | Status: DC | PRN
Start: 2023-11-04 — End: 2024-03-01

## 2023-11-04 MED ORDER — SODIUM CHLORIDE 0.9% FLUSH
10.0000 mL | Freq: Once | INTRAVENOUS | Status: AC
  Administered 2023-11-04: 10 mL

## 2023-11-04 MED ORDER — SODIUM CHLORIDE 0.9% FLUSH
10.0000 mL | INTRAVENOUS | Status: DC | PRN
  Administered 2023-11-04: 10 mL

## 2023-11-04 MED ORDER — PROCHLORPERAZINE MALEATE 10 MG PO TABS
10.0000 mg | ORAL_TABLET | Freq: Once | ORAL | Status: AC
Start: 2023-11-04 — End: 2023-11-04
  Administered 2023-11-04: 10 mg via ORAL
  Filled 2023-11-04: qty 1

## 2023-11-04 MED ORDER — OMEPRAZOLE 20 MG PO CPDR: 20.0000 mg | DELAYED_RELEASE_CAPSULE | Freq: Every day | ORAL | 2 refills | Status: DC

## 2023-11-04 MED ORDER — SODIUM CHLORIDE 0.9 % IV SOLN
800.0000 mg/m2 | Freq: Once | INTRAVENOUS | Status: AC
  Administered 2023-11-04: 1406 mg via INTRAVENOUS
  Filled 2023-11-04: qty 36.98

## 2023-11-04 MED ORDER — HEPARIN SOD (PORK) LOCK FLUSH 100 UNIT/ML IV SOLN
500.0000 [IU] | Freq: Once | INTRAVENOUS | Status: AC | PRN
  Administered 2023-11-04: 500 [IU]

## 2023-11-04 NOTE — Progress Notes (Signed)
 [facility]   [phone] [phone]   Clinic Follow up Note   Patient Care Team: Lorenda Ishihara, MD as PCP - General (Internal Medicine) Tessa Lerner, DO as PCP - Cardiology (Cardiology) Tat, Octaviano Batty, DO as Consulting Physician (Neurology) Earna Coder, MD as Consulting Physician (Hematology and Oncology) Fritzi Mandes, MD as Consulting Physician (General Surgery) Malachy Mood, MD as Consulting Physician (Hematology and Oncology) Josephine Igo, DO as Consulting Physician (Pulmonary Disease) Pickenpack-Cousar, Arty Baumgartner, NP as Nurse Practitioner [facility] and Palliative Medicine)  Date of Service:  11/04/2023  CHIEF COMPLAINT: f/u of pancreatic cancer  CURRENT THERAPY:  Gemcitabine every 2 weeks  Oncology History   Pancreatic cancer [facility]) diagnosed in 09/2021, deemed poor surgical candidate due to his age and medical co-morbidities -S/p single agent gemcitabine 01/13/22 - 04/01/22 and 5 fx SBRT [name]) 05/04/22 - 05/14/22. CA 19-9 decreased after treatment -Surveillance CT 11/09/2022 showed slightly decreased size of pancreatic head mass, no evidence of metastatic disease.  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  He is taking ibuprofen as needed.  No other clinical concern for cancer progression. -Lab reviewed, overall stable, tumor marker still pending. -He was admitted for right femoral neck fracture  on 02/03/23 after a fall and had surgery  -CT scan from May 17, 2023 showed multiple lung nodules, with dominant 1.5 cm nodule in the right middle lobe, highly concerning for metastasis.  His lung lesions are positive on PET scan. -Bronchoscopy and biopsy attempted, which showed atypical cells. -He restarted chemo gemcitabine and abraxane on 07/29/2023. -restaging CT 10/01/2023 showed stable disease  -Due to increased fatigue, chemo changed to single agent  gemcitabine on October 21, 2023   Assessment and Plan    Pancreatic Cancer Elderly male with pancreatic cancer on chemotherapy. Reports feeling well overall with no pain, only some stomach discomfort. Blood counts are stable, hemoglobin at 10, no need for transfusion. Kidney and liver functions are normal. Tumor marker test pending. Discussed that reduced chemotherapy may result in less disease control but aims to stabilize it. - Administer gemcitabine chemotherapy - Check tumor marker - Repeat scan at the end of April  Oral Sore Reported sore on the tongue, possibly related to chemotherapy. No visible sore upon examination, may have healed. Discussed that mouth sores are common with chemotherapy. 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 Managed with oxycodone, taken 2-3 times daily. - Continue oxycodone as prescribed - Refill oxycodone as needed  Dysphagia Difficulty swallowing, taking a long time to eat and requiring concentration to swallow. Currently on omeprazole 40 mg for suspected acid reflux, but no improvement in swallowing. Follow up with Parkinson's doctor for further evaluation. - Reduce omeprazole to 20 mg - Provide prescription for 20 mg omeprazole  Borderline hypotension Low blood pressure noted today. Does not take regular antihypertensive medication and only takes it when blood pressure is high. No frequent dizziness reported. - Increase fluid intake with electrolytes (Pedialyte, Gatorade) - Add salt to diet to help maintain blood pressure  General Health Maintenance Lives in independent living and can perform most activities but gets tired easily. Home care assessment recently completed. Encouraged to use long-term care insurance for home care assistance. - Use walker to prevent falls - Utilize long-term care insurance for home care  assistance  Plan -Lab reviewed, adequate for treatment, will proceed gemcitabine today and continue every 2 weeks -f/u in 2 weeks  - Follow up with his neurologist for dysphagia - Repeat scan at the end of April based on tumor marker results.         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 Sclerotic 1.5 cm left ischial lesion/ Sclerotic medial left iliac bone 1.5 cm lesion (series 2/image 47), increased from 1.0 cm. Gleason score of 4+5= 9; with almost all cores involved greater than 80%.  9/16 Lupron 32-month depot on 9/16. [Urology; Dr.Siniski]  # MID OCT 2020- Zytiga 1000 mg+ prednisone; stopped December 2021 [poor tolerance if with RVR]; DISCONTINUED.   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       Prostate cancer metastatic to bone (HCC)  05/24/2019 Initial Diagnosis   Prostate cancer metastatic to bone [facility])   Pancreatic cancer (HCC)  10/02/2021 Imaging   CT ABDOMEN PELVIS W CONTRAST   IMPRESSION: 1. There is new pancreatic ductal dilation with an indeterminate 19 mm hypodense low-density mass area in the head/uncinate process of the pancreas. Recommend further evaluation with dedicated MRI/MRCP with contrast. 2. No evidence of bowel obstruction.  Moderate rectal stool ball. 3. Scattered peripherally located clustered pulmonary nodules in the RIGHT middle lobe. Findings are likely infectious or inflammatory in etiology. Given history of malignancy, recommend follow-up as per clinical protocol.   10/28/2021 Imaging   MR ABDOMEN MRCP W WO CONTAST   IMPRESSION: 1. Examination is significantly limited by breath motion artifact throughout. 2. The main pancreatic duct is diffusely dilated  from the level of the superior pancreatic head, measuring up to 0.7 cm. 3. In the inferior pancreatic head and uncinate, there is a multilobulated, fluid signal cystic lesion measuring 1.8 x 1.0 cm. Due to breath motion artifact it is difficult to determine whether this communicates to the adjacent duct. There are multiple additional subcentimeter cystic lesions scattered throughout the pancreas, several of which clearly communicate to the main pancreatic duct. 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 Recommend EUS/FNA for further diagnosis given the presence of pancreatic ductal dilatation. 4. Status post left nephrectomy. 5. No evidence of recurrent or metastatic disease in the abdomen.   12/25/2021 Procedure   UPPER ENDOSCOPIC ULTRASOUND-By Dr. Meridee Score  - A mass-like region was identified in the pancreatic head where the pancreatic duct dilates with multiple cystic regions noted throughout the pancreas as well. 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 However, the endosonographic appearance is suspicious for potential adenocarcinoma. This was staged T2 N0 Mx by endosonographic criteria. T - No malignant-appearing lymph nodes were visualized in the celiac region (level 20), peripancreatic region and porta hepatis region.   12/25/2021 Pathology Results   CYTOLOGY - NON PAP  CASE: [id number]   FINAL MICROSCOPIC DIAGNOSIS:  A. PANCREAS, HEAD, FINE NEEDLE ASPIRATION:  - Malignant cells consistent with adenocarcinoma     01/01/2022 Initial Diagnosis   Pancreatic cancer (HCC)   01/13/2022 - 04/01/2022 Chemotherapy   Patient is on Treatment Plan : PANCREAS Gemcitabine D1,15 q28d x 4 Cycles     01/26/2022 Cancer Staging   Staging form: Exocrine Pancreas, AJCC 8th Edition -  Clinical: Stage IB (cT2, cN0, cM0) - Signed by Malachy Mood, MD on 01/26/2022 Total positive nodes: 0    Genetic Testing   [facility]  CancerNext-Expanded Panel was Negative. Of note, a variant of uncertain significance was identified in the BLM gene (p.[id number]) and GALNT12 gene ([id number]). Report date is 01/26/2022.  The CancerNext-Expanded gene panel offered by [facility] and includes sequencing, rearrangement, and RNA analysis for the following 77 genes: AIP, ALK, APC, ATM, AXIN2, BAP1, BARD1, BLM, BMPR1A, BRCA1, BRCA2, BRIP1, CDC73, CDH1, CDK4, CDKN1B, CDKN2A, CHEK2, CTNNA1, DICER1, FANCC, FH, FLCN, GALNT12, KIF1B, LZTR1, MAX, MEN1, MET, MLH1, MSH2, MSH3, MSH6, MUTYH, NBN, NF1, NF2, NTHL1, PALB2, PHOX2B, PMS2, POT1, PRKAR1A, PTCH1, PTEN, RAD51C, RAD51D, RB1, RECQL, RET, SDHA, SDHAF2, SDHB, SDHC, SDHD, SMAD4, SMARCA4, SMARCB1, SMARCE1, STK11, SUFU, TMEM127, TP53, TSC1, TSC2, VHL and XRCC2 (sequencing and deletion/duplication); EGFR, EGLN1, HOXB13, KIT, MITF, PDGFRA, POLD1, and POLE (sequencing only); EPCAM and GREM1 (deletion/duplication only).    08/12/2022 Imaging    IMPRESSION: 1. Slight interval increase in size of the pancreatic head-uncinate process mass with increasing direct contact of the SMA and portal vein as discussed. Also with a replaced RIGHT hepatic artery which passes through the tumor. 2. No signs of acute inflammation currently about the pancreas. With similar appearance of ductal obstruction and peripheral atrophy of pancreas due to the tumor. 3. Post LEFT nephrectomy. 4. Sclerotic bony lesions of the LEFT ischium and LEFT iliac bone similar to prior imaging. 5. Mild hyperenhancement of LEFT prostate 14 mm with asymmetry, nonspecific. This is unchanged in this patient with history of prostate neoplasm. 6. Aortic atherosclerosis.   07/29/2023 -  Chemotherapy   Patient is on Treatment Plan : PANCREATIC Abraxane D1,8,15 + Gemcitabine D1,8,15 q28d     08/03/2023 Imaging   CT chest abdomen pelvis with contrast IMPRESSION: 1. No substantial change in size of heterogeneously enhancing pancreatic  head mass with adjacent fiducials. 2. Slightly increased main pancreatic ductal dilation and mild intrahepatic bile duct dilation. 3. No evidence of new or progressive metastatic disease in the abdomen or pelvis. 4. Similar mildly sclerotic focus in the left ischial tuberosity dating back to 01/07/2019. A left iliac bone faintly sclerotic lesion measuring 1.4 cm has decreased in density since 2020, likely treated osseous metastasis. 5. Left lower lobe nodules measuring up to 5 mm are unchanged. 6. Aortic Atherosclerosis (ICD10-I70.0).        Discussed the use of AI scribe software for clinical note transcription with the patient, who gave verbal consent to proceed.  History of Present Illness   The patient, with a known history of pancreatic cancer, presents for a follow-up visit after starting chemotherapy two weeks ago. The patient reports feeling overall better compared to the last visit when he was too weak to continue chemotherapy and required IV fluids. However, he continues to experience stomach discomfort, which he does not take any specific medication for. He also reports back pain, which he describes as moving around but centered in his lower back and pelvic area. For this, he takes oxycodone two to three times a day.  In addition to his cancer-related symptoms, the patient has been experiencing difficulty swallowing. He describes needing to chew his food extensively before swallowing and even then, it requires a lot of concentration. He has been taking omeprazole 40mg  for potential acid reflux, but there has been no improvement in his swallowing difficulty. He is scheduled for a test with a Parkinson's doctor to further evaluate this issue.  The patient  also reports a recent sore on his tongue, which has since healed. He was advised to use mouthwash for any future mouth sores. The patient lives independently and is able to take care of himself, although he reports getting tired. He uses  a walker to prevent falls and has recently started in-home care through his long-term care insurance.         All other systems were reviewed with the patient and are negative.  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)    Prostate cancer metastatic to bone (HCC)    REM sleep behavior disorder    Sleep apnea    BiPap - uses nightly    SURGICAL HISTORY: Past Surgical History:  Procedure Laterality Date   ANTERIOR APPROACH HEMI HIP ARTHROPLASTY Right 02/05/2023   Procedure: ANTERIOR APPROACH HEMI HIP ARTHROPLASTY;  Surgeon: Jodi Geralds, MD;  Location: [facility] OR;  Service: Orthopedics;  Laterality: Right;   APPENDECTOMY  1963   BIOPSY  12/25/2021   Procedure: BIOPSY;  Surgeon: Meridee Score Netty Starring., MD;  Location: [facility] ENDOSCOPY;  Service: Gastroenterology;;   BRONCHIAL BIOPSY  07/05/2023   Procedure: BRONCHIAL BIOPSIES;  Surgeon: Josephine Igo, DO;  Location: [facility] ENDOSCOPY;  Service: Pulmonary;;   COLONOSCOPY  2010, 2015   ESOPHAGOGASTRODUODENOSCOPY N/A 04/16/2022   Procedure: ESOPHAGOGASTRODUODENOSCOPY (EGD);  Surgeon: Lemar Lofty., MD;  Location: [facility] ENDOSCOPY;  Service: Gastroenterology;  Laterality: N/A;   ESOPHAGOGASTRODUODENOSCOPY (EGD) WITH PROPOFOL N/A 12/25/2021   Procedure: ESOPHAGOGASTRODUODENOSCOPY (EGD) WITH PROPOFOL;  Surgeon: Meridee Score Netty Starring., MD;  Location: [facility] ENDOSCOPY;  Service: Gastroenterology;  Laterality: N/A;   EUS N/A 12/25/2021   Procedure: UPPER ENDOSCOPIC ULTRASOUND (EUS) RADIAL;  Surgeon: Lemar Lofty., MD;  Location: [facility] ENDOSCOPY;  Service: Gastroenterology;  Laterality: N/A;   EUS N/A 04/16/2022   Procedure: UPPER ENDOSCOPIC ULTRASOUND (EUS) RADIAL;  Surgeon: Lemar Lofty.,  MD;  Location: [facility] ENDOSCOPY;  Service: Gastroenterology;  Laterality: N/A;   FIDUCIAL MARKER PLACEMENT N/A 04/16/2022   Procedure: FIDUCIAL MARKER PLACEMENT;  Surgeon: Lemar Lofty., MD;  Location: 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   KYPHOPLASTY N/A 08/15/2020   Procedure: L2 compression fracture;  Surgeon: Kennedy Bucker, MD;  Location: [facility] ORS;  Service: Orthopedics;  Laterality: N/A;   KYPHOPLASTY N/A 08/29/2020   Procedure: T8 KYPHOPLASTY;  Surgeon: Kennedy Bucker, MD;  Location: [facility] ORS;  Service: Orthopedics;  Laterality: N/A;   KYPHOPLASTY N/A 11/12/2020   Procedure: L1 KYPHOPLASTY;  Surgeon: Kennedy Bucker, MD;  Location: [facility] ORS;  Service: Orthopedics;  Laterality: N/A;   PORTACATH PLACEMENT N/A 02/05/2022   Procedure: INSERTION PORT-A-CATH;  Surgeon: Fritzi Mandes, MD;  Location: [facility] ORS;  Service: General;  Laterality: N/A;    I have reviewed the social history and family history with the patient and they are unchanged from previous note.  ALLERGIES:  is allergic to influenza vaccines and influenza virus vaccine.  MEDICATIONS:  Current Outpatient Medications  Medication Sig Dispense Refill  omeprazole (PRILOSEC) 20 MG capsule Take 1 capsule (20 mg total) by mouth daily. 30 capsule 2   ondansetron (ZOFRAN) 8 MG tablet Take 1 tablet (8 mg total) by mouth every 8 (eight) hours as needed for nausea or vomiting. 30 tablet 1   acetaminophen (TYLENOL) 500 MG tablet Take 1 tablet (500 mg total) by mouth every 6 (six) hours as needed for moderate pain or mild pain. (Patient taking differently: Take 500 mg by mouth as needed for moderate pain (pain score 4-6) or mild pain (pain score 1-3).) 30 tablet 0    ammonium lactate (LAC-HYDRIN) 12 % lotion Apply 1 Application topically as needed.     bisacodyl (DULCOLAX) 10 MG suppository Place 1 suppository (10 mg total) rectally daily as needed for moderate constipation. 12 suppository 0   carbidopa-levodopa (SINEMET CR) 50-200 MG tablet TAKE 1 TABLET AT BEDTIME 90 tablet 3   carbidopa-levodopa (SINEMET IR) 25-100 MG tablet Take 3 tablets at 7 AM, 2 tablets at 11 AM, 3 tablets at 3 PM, 2 tablets at 7 PM 900 tablet 0   ciclopirox (PENLAC) 8 % solution Apply 1 Application topically at bedtime.     clonazePAM (KLONOPIN) 0.5 MG tablet Take 1 tablet (0.5 mg total) by mouth at bedtime. 5 tablet 0   cyanocobalamin (VITAMIN B12) 1000 MCG tablet Take 1,000 mcg by mouth daily.     diltiazem (CARDIZEM) 30 MG tablet Take 1 tablet (30 mg total) by mouth 2 (two) times daily. Do not take if systolic blood pressure (top number) is less than 100, or if pulse if less than 55. (Patient taking differently: Take 30 mg by mouth as needed. Do not take if systolic blood pressure (top number) is less than 100, or if pulse if less than 55.) 180 tablet 3   ELIQUIS 5 MG TABS tablet TAKE 1 TABLET TWICE A DAY 120 tablet 5   Glycerin-Hypromellose-PEG 400 (VISINE DRY EYE OP) Place 1 drop into both eyes 2 (two) times daily as needed (eye irritation).      ketoconazole (NIZORAL) 2 % shampoo Apply 1 application  topically 3 (three) times a week.     leuprolide, 6 Month, (ELIGARD) 45 MG injection Inject 45 mg into the skin every 6 (six) months.     lidocaine (XYLOCAINE) 2 % solution Use as directed 15 mLs in the mouth or throat every 6 (six) hours as needed for mouth pain. 100 mL 2   lidocaine-prilocaine (EMLA) cream Apply to port site 1 to 2 hours prior to appointments/treatment 30 g 3   oxyCODONE (OXY IR/ROXICODONE) 5 MG immediate release tablet Take 1 tablet (5 mg total) by mouth every 4 (four) hours as needed for severe pain (pain score 7-10). 90 tablet 0   PANCREAZE 37000-97300 units CPEP  TAKE 2 CAPSULES WITH BREAKFAST, LUNCH, AND EVENING MEAL (WITH EACH MEAL) AND 1 CAPSULE WITH SNACK 600 capsule 5   polyethylene glycol powder (MIRALAX) 17 GM/SCOOP powder Take 17 g by mouth in the morning, at noon, and at bedtime.     prochlorperazine (COMPAZINE) 10 MG tablet Take 1 tablet (10 mg total) by mouth every 6 (six) hours as needed for nausea or vomiting. 30 tablet 2   rosuvastatin (CRESTOR) 10 MG tablet TAKE 1 TABLET DAILY 90 tablet 1   tiZANidine (ZANAFLEX) 2 MG tablet Take 1 tablet (2 mg total) by mouth every 8 (eight) hours as needed for muscle spasms. 10 tablet 0   triamcinolone cream (KENALOG) 0.1 % Apply 1 application  topically 2 (two) times daily. (Patient taking differently: Apply 1 application  topically 2 (two) times daily as needed (itching).) 453.6 g 1   zoledronic acid (RECLAST) 5 MG/100ML SOLN injection Inject 5 mg into the vein See admin instructions. Once a year     No current facility-administered medications for this visit.   Facility-Administered Medications Ordered in Other Visits  Medication Dose Route Frequency Provider Last Rate Last Admin   0.9 %  sodium chloride infusion   Intravenous Continuous Malachy Mood, MD 10 mL/hr at 11/04/23 1338 New Bag at 11/04/23 1338   sodium chloride flush (NS) 0.9 % injection 10 mL  10 mL Intracatheter PRN Malachy Mood, MD   10 mL at 11/04/23 1446    PHYSICAL EXAMINATION: ECOG PERFORMANCE STATUS: 2 - Symptomatic, <50% confined to bed  Vitals:   11/04/23 1251  BP: 97/78  Pulse: (!) 116  Resp: 17  Temp: 97.6 F (36.4 C)  SpO2: 99%   Wt Readings from Last 3 Encounters:  11/04/23 141 lb 3.2 oz (64 kg)  11/01/23 138 lb (62.6 kg)  10/26/23 138 lb 14.2 oz (63 kg)     GENERAL:alert, no distress and comfortable SKIN: skin color, texture, turgor are normal, no rashes or significant lesions EYES: normal, Conjunctiva are pink and non-injected, sclera clear NECK: supple, thyroid normal size, non-tender, without nodularity LYMPH:  no  palpable lymphadenopathy in the cervical, axillary  LUNGS: clear to auscultation and percussion with normal breathing effort HEART: regular rate & rhythm and no murmurs and no lower extremity edema ABDOMEN:abdomen soft, non-tender and normal bowel sounds Musculoskeletal:no cyanosis of digits and no clubbing  NEURO: alert & oriented x 3 with fluent speech, no focal motor/sensory deficits HEENT: Oral cavity with slight redness, no mouth sores.      LABORATORY DATA:  I have reviewed the data as listed    Latest Ref Rng & Units 11/04/2023   12:32 PM 10/26/2023   10:32 AM 10/21/2023   12:33 PM  CBC  WBC 4.0 - 10.5 K/uL 6.0  5.6  9.0   Hemoglobin 13.0 - 17.0 g/dL 29.5  9.7  28.4   Hematocrit 39.0 - 52.0 % 31.8  31.0  34.7   Platelets 150 - 400 K/uL 154  132  243         Latest Ref Rng & Units 11/04/2023   12:32 PM 10/26/2023   10:32 AM 10/21/2023   12:33 PM  CMP  Glucose 70 - 99 mg/dL 99  93  132   BUN 8 - 23 mg/dL 15  22  19    Creatinine 0.61 - 1.24 mg/dL 4.40  1.02  7.25   Sodium 135 - 145 mmol/L 138  138  139   Potassium 3.5 - 5.1 mmol/L 4.5  4.3  4.0   Chloride 98 - 111 mmol/L 107  105  108   CO2 22 - 32 mmol/L 28  25  27    Calcium 8.9 - 10.3 mg/dL 9.4  9.0  9.3   Total Protein 6.5 - 8.1 g/dL 6.3  6.5  6.6   Total Bilirubin 0.0 - 1.2 mg/dL 0.9  1.6  1.2   Alkaline Phos 38 - 126 U/L 67  59  76   AST 15 - 41 U/L 32  29  31   ALT 0 - 44 U/L 5  5  <5       RADIOGRAPHIC STUDIES: I have personally reviewed the radiological images as listed and agreed with the findings  in the report. No results found.    Orders Placed This Encounter  Procedures   CBC with Differential (Cancer Center Only)    Standing Status:   Future    Expected Date:   01/13/2024    Expiration Date:   01/12/2025   CMP (Cancer Center only)    Standing Status:   Future    Expected Date:   01/13/2024    Expiration Date:   01/12/2025   CBC with Differential (Cancer Center Only)    Standing Status:   Future     Expected Date:   01/27/2024    Expiration Date:   01/26/2025   CMP (Cancer Center only)    Standing Status:   Future    Expected Date:   01/27/2024    Expiration Date:   01/26/2025   All questions were answered. The patient knows to call the clinic with any problems, questions or concerns. No barriers to learning was detected. The total time spent in the appointment was 30 minutes.     Malachy Mood, MD 11/04/2023

## 2023-11-04 NOTE — Patient Instructions (Signed)
 [facility] - A DEPT OF [facility]  Discharge Instructions: Thank you for choosing [facility] to provide your oncology and hematology care.   If you have a lab appointment with the Cancer Center, please go directly to the Cancer Center and check in at the registration area.   Wear comfortable clothing and clothing appropriate for easy access to any Portacath or PICC line.   We strive to give you quality time with your provider. You may need to reschedule your appointment if you arrive late (15 or more minutes).  Arriving late affects you and other patients whose appointments are after yours.  Also, if you miss three or more appointments without notifying the office, you may be dismissed from the clinic at the provider's discretion.      For prescription refill requests, have your pharmacy contact our office and allow 72 hours for refills to be completed.    Today you received the following chemotherapy and/or immunotherapy agents gemcitabine      To help prevent nausea and vomiting after your treatment, we encourage you to take your nausea medication as directed.  BELOW ARE SYMPTOMS THAT SHOULD BE REPORTED IMMEDIATELY: *FEVER GREATER THAN 100.4 F (38 C) OR HIGHER *CHILLS OR SWEATING *NAUSEA AND VOMITING THAT IS NOT CONTROLLED WITH YOUR NAUSEA MEDICATION *UNUSUAL SHORTNESS OF BREATH *UNUSUAL BRUISING OR BLEEDING *URINARY PROBLEMS (pain or burning when urinating, or frequent urination) *BOWEL PROBLEMS (unusual diarrhea, constipation, pain near the anus) TENDERNESS IN MOUTH AND THROAT WITH OR WITHOUT PRESENCE OF ULCERS (sore throat, sores in mouth, or a toothache) UNUSUAL RASH, SWELLING OR PAIN  UNUSUAL VAGINAL DISCHARGE OR ITCHING   Items with * indicate a potential emergency and should be followed up as soon as possible or go to the Emergency Department if any problems should occur.  Please show the CHEMOTHERAPY ALERT CARD or IMMUNOTHERAPY  ALERT CARD at check-in to the Emergency Department and triage nurse.  Should you have questions after your visit or need to cancel or reschedule your appointment, please contact [facility] - A DEPT OF [facility]  Dept: [phone]  and follow the prompts.  Office hours are 8:00 a.m. to 4:30 p.m. Monday - Friday. Please note that voicemails left after 4:00 p.m. may not be returned until the following business day.  We are closed weekends and major holidays. You have access to a nurse at all times for urgent questions. Please call the main number to the clinic Dept: [phone] and follow the prompts.   For any non-urgent questions, you may also contact your provider using MyChart. We now offer e-Visits for anyone 49 and older to request care online for non-urgent symptoms. For details visit [email or web address].   Also download the MyChart app! Go to the app store, search "MyChart", open the app, select [place], and log in with your MyChart username and password.

## 2023-11-04 NOTE — Telephone Encounter (Signed)
 [facility] [place] Clinical Research Coordinator note:   This visit for Subject [name] [name] with DOB: 1940/09/30 on 11/04/2023. Subject contacted regarding [id number] to inform that Principal Investigator has changed to Dr. Delton Coombes. Patient expressed understanding.

## 2023-11-04 NOTE — Assessment & Plan Note (Addendum)
 diagnosed in 09/2021, deemed poor surgical candidate due to his age and medical co-morbidities -S/p single agent gemcitabine 01/13/22 - 04/01/22 and 5 fx SBRT [name]) 05/04/22 - 05/14/22. CA 19-9 decreased after treatment -Surveillance CT 11/09/2022 showed slightly decreased size of pancreatic head mass, no evidence of metastatic disease.  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  He is taking ibuprofen as needed.  No other clinical concern for cancer progression. -Lab reviewed, overall stable, tumor marker still pending. -He was admitted for right femoral neck fracture  on 02/03/23 after a fall and had surgery  -CT scan from May 17, 2023 showed multiple lung nodules, with dominant 1.5 cm nodule in the right middle lobe, highly concerning for metastasis.  His lung lesions are positive on PET scan. -Bronchoscopy and biopsy attempted, which showed atypical cells. -He restarted chemo gemcitabine and abraxane on 07/29/2023. -restaging CT 10/01/2023 showed stable disease  -Due to increased fatigue, chemo changed to single agent gemcitabine on October 21, 2023

## 2023-11-05 LAB — CANCER ANTIGEN 19-9: CA 19-9: 395 U/mL — ABNORMAL HIGH (ref 0–35)

## 2023-11-05 NOTE — Telephone Encounter (Signed)
 Marland Kitchen

## 2023-11-08 ENCOUNTER — Encounter: Payer: Self-pay | Admitting: Hematology

## 2023-11-09 DIAGNOSIS — R2689 Other abnormalities of gait and mobility: Secondary | ICD-10-CM | POA: Diagnosis not present

## 2023-11-09 DIAGNOSIS — M6281 Muscle weakness (generalized): Secondary | ICD-10-CM | POA: Diagnosis not present

## 2023-11-10 ENCOUNTER — Ambulatory Visit (HOSPITAL_COMMUNITY)
Admission: RE | Admit: 2023-11-10 | Discharge: 2023-11-10 | Disposition: A | Discharge status: Home / Self Care | Payer: Medicare Other | Source: Ambulatory Visit | Attending: Internal Medicine | Admitting: Internal Medicine

## 2023-11-10 ENCOUNTER — Other Ambulatory Visit: Payer: Self-pay

## 2023-11-10 DIAGNOSIS — G20A1 Parkinson's disease without dyskinesia, without mention of fluctuations: Secondary | ICD-10-CM | POA: Diagnosis not present

## 2023-11-10 DIAGNOSIS — R140 Abdominal distension (gaseous): Secondary | ICD-10-CM

## 2023-11-10 DIAGNOSIS — R1310 Dysphagia, unspecified: Secondary | ICD-10-CM | POA: Diagnosis present

## 2023-11-10 DIAGNOSIS — G4730 Sleep apnea, unspecified: Secondary | ICD-10-CM | POA: Diagnosis not present

## 2023-11-10 DIAGNOSIS — R1313 Dysphagia, pharyngeal phase: Secondary | ICD-10-CM | POA: Insufficient documentation

## 2023-11-10 DIAGNOSIS — R638 Other symptoms and signs concerning food and fluid intake: Secondary | ICD-10-CM | POA: Diagnosis not present

## 2023-11-10 DIAGNOSIS — C259 Malignant neoplasm of pancreas, unspecified: Secondary | ICD-10-CM | POA: Insufficient documentation

## 2023-11-10 DIAGNOSIS — R059 Cough, unspecified: Secondary | ICD-10-CM | POA: Diagnosis not present

## 2023-11-10 DIAGNOSIS — K5900 Constipation, unspecified: Secondary | ICD-10-CM

## 2023-11-10 NOTE — Telephone Encounter (Signed)
Error

## 2023-11-11 DIAGNOSIS — R2689 Other abnormalities of gait and mobility: Secondary | ICD-10-CM | POA: Diagnosis not present

## 2023-11-11 DIAGNOSIS — M6281 Muscle weakness (generalized): Secondary | ICD-10-CM | POA: Diagnosis not present

## 2023-11-13 ENCOUNTER — Other Ambulatory Visit: Payer: Self-pay | Admitting: Nurse Practitioner

## 2023-11-13 DIAGNOSIS — K5900 Constipation, unspecified: Secondary | ICD-10-CM

## 2023-11-13 DIAGNOSIS — R140 Abdominal distension (gaseous): Secondary | ICD-10-CM

## 2023-11-13 MED ORDER — PANCREAZE 37000-97300 UNITS PO CPEP: ORAL_CAPSULE | ORAL | 5 refills | Status: DC

## 2023-11-15 DIAGNOSIS — R2689 Other abnormalities of gait and mobility: Secondary | ICD-10-CM | POA: Diagnosis not present

## 2023-11-15 DIAGNOSIS — M6281 Muscle weakness (generalized): Secondary | ICD-10-CM | POA: Diagnosis not present

## 2023-11-16 ENCOUNTER — Other Ambulatory Visit: Payer: Self-pay

## 2023-11-17 ENCOUNTER — Other Ambulatory Visit: Payer: Self-pay | Admitting: Nurse Practitioner

## 2023-11-17 ENCOUNTER — Encounter: Payer: Self-pay | Admitting: Hematology

## 2023-11-17 ENCOUNTER — Encounter: Payer: Self-pay | Admitting: Internal Medicine

## 2023-11-17 DIAGNOSIS — M6281 Muscle weakness (generalized): Secondary | ICD-10-CM | POA: Diagnosis not present

## 2023-11-17 DIAGNOSIS — C250 Malignant neoplasm of head of pancreas: Secondary | ICD-10-CM

## 2023-11-17 DIAGNOSIS — R2689 Other abnormalities of gait and mobility: Secondary | ICD-10-CM | POA: Diagnosis not present

## 2023-11-17 MED ORDER — OXYCODONE HCL 5 MG PO TABS: 5.0000 mg | ORAL_TABLET | ORAL | 0 refills | Status: DC | PRN

## 2023-11-17 NOTE — Progress Notes (Unsigned)
 Patient Care Team: Lorenda Ishihara, MD as PCP - General (Internal Medicine) Tessa Lerner, DO as PCP - Cardiology (Cardiology) Tat, Octaviano Batty, DO as Consulting Physician (Neurology) Earna Coder, MD as Consulting Physician (Hematology and Oncology) Fritzi Mandes, MD as Consulting Physician (General Surgery) Malachy Mood, MD as Consulting Physician (Hematology and Oncology) Josephine Igo, DO as Consulting Physician (Pulmonary Disease) Pickenpack-Cousar, Arty Baumgartner, NP as Nurse Practitioner [facility])  Clinic Day:  11/19/2023  Referring physician: Malachy Mood, MD  ASSESSMENT & PLAN:   Assessment & Plan: Pancreatic cancer [facility]) diagnosed in 09/2021, deemed poor surgical candidate due to his age and medical co-morbidities -S/p single agent gemcitabine 01/13/22 - 04/01/22 and 5 fx SBRT [name]) 05/04/22 - 05/14/22. CA 19-9 decreased after treatment -Surveillance CT 11/09/2022 showed slightly decreased size of pancreatic head mass, no evidence of metastatic disease.  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  He is taking ibuprofen as needed.  No other clinical concern for cancer progression. -Lab reviewed, overall stable, tumor marker still pending. -He was admitted for right femoral neck fracture  on 02/03/23 after a fall and had surgery  -CT scan from May 17, 2023 showed multiple lung nodules, with dominant 1.5 cm nodule in the right middle lobe, highly concerning for metastasis.  His lung lesions are positive on PET scan. -Bronchoscopy and biopsy attempted, which showed atypical cells. -He restarted chemo gemcitabine and abraxane on 07/29/2023. -restaging CT 10/01/2023 showed stable disease  -Due to increased fatigue, chemo changed to single agent gemcitabine on October 21, 2023 -- Increased dysphagia.  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   PT has been recommended to help with swallowing and improve robustness of cough reflex.  Patient waiting to hear about initial appointments. Continue with single agent gemcitabine.  Restaging CT CAP due for end of April 2025.  This was ordered today.   Dysphagia: Reviewed results of swallow study with patient and his family members.  Does show moderate to severe dysfunction.  Agree with recommendation for physical therapy to help with swallowing and increasing robustness of cough reflex.  Chronic A-fib Rapid heart rate this morning.  Patient to take his as needed Cardizem. Asymptomatic.  Okay to treat with gemcitabine.  Monitor via pulse ox.  Notify provider if heart rate < 50 or >120  Plan: Labs reviewed with patient. -Mild, stable anemia. -Awaiting results of CA 19.9. -Labs otherwise unremarkable. Okay to treat with single agent gemcitabine. Restaging CT CAP should be scheduled for late April 2025.  This was ordered today. Labs/flush, follow-up, and treatment as scheduled.   The patient understands the plans discussed today and is in agreement with them.  He knows to contact our office if he develops concerns prior to his next appointment.  I provided 25 minutes of face-to-face time during this encounter and > 50% was spent counseling as documented under my assessment and plan.    Carlean Jews, NP  [facility] - A DEPT [facility] [address] [place] [address] Dept: [phone] Dept Fax: [phone]   Orders Placed This Encounter  Procedures   CT CHEST ABDOMEN PELVIS W CONTRAST    Standing Status:   Future    Expected Date:   12/30/2023    Expiration Date:   11/17/2024    If indicated  for the ordered procedure, I authorize the administration of contrast media per Radiology protocol:   Yes    Does the patient have a contrast media/X-ray dye allergy?:   No    Preferred imaging location?:   [facility]    If indicated for the ordered procedure, I authorize the administration of oral contrast media per Radiology protocol:   Yes      CHIEF COMPLAINT:  CC: Pancreatic cancer  Current Treatment: Single agent gemcitabine days 1, 8, and 15 of 28-day cycle  INTERVAL HISTORY:  [name] [name] is here today for repeat clinical assessment.  Today, he reports for cycle 5 day 1 of single agent gemcitabine.  Most recent check of CA 19-9 has shown persistent decrease, with level at 395.  The patient has reported ongoing dysphagia.  Swallow test per neurology on 11/10/2023.  It is recommended that he have SLP to prescribe dysphagia exercises to strengthen motility and cough response for maximal swallow efficiency and airway protection.  He reports some abdominal discomfort for the past few nights.  Abdomen feels distended.  Making it difficult for him to sleep.  Denies nausea or vomiting.  Denies diarrhea or constipation. He denies chest pain, chest pressure, or shortness of breath.  He is actively in A-fib.  He is asymptomatic.  He denies fevers or chills. He denies pain. His appetite is good. His weight has decreased 3 pounds over last 3 weeks .  I have reviewed the past medical history, past surgical history, social history and family history with the patient and they are unchanged from previous note.  ALLERGIES:  is allergic to influenza vaccines and influenza virus vaccine.  MEDICATIONS:  Current Outpatient Medications  Medication Sig Dispense Refill   acetaminophen (TYLENOL) 500 MG tablet Take 1 tablet (500 mg total) by mouth every 6 (six) hours as needed for moderate pain or mild pain. (Patient taking differently: Take 500 mg by mouth as needed for moderate pain (pain score 4-6) or mild pain (pain score 1-3).) 30 tablet 0   ammonium lactate (LAC-HYDRIN) 12 % lotion Apply 1 Application topically as needed.     bisacodyl (DULCOLAX) 10 MG suppository Place 1 suppository (10 mg total) rectally daily as  needed for moderate constipation. 12 suppository 0   carbidopa-levodopa (SINEMET CR) 50-200 MG tablet TAKE 1 TABLET AT BEDTIME 90 tablet 3   carbidopa-levodopa (SINEMET IR) 25-100 MG tablet Take 3 tablets at 7 AM, 2 tablets at 11 AM, 3 tablets at 3 PM, 2 tablets at 7 PM 900 tablet 0   ciclopirox (PENLAC) 8 % solution Apply 1 Application topically at bedtime.     clonazePAM (KLONOPIN) 0.5 MG tablet Take 1 tablet (0.5 mg total) by mouth at bedtime. 5 tablet 0   cyanocobalamin (VITAMIN B12) 1000 MCG tablet Take 1,000 mcg by mouth daily.     diltiazem (CARDIZEM) 30 MG tablet Take 1 tablet (30 mg total) by mouth 2 (two) times daily. Do not take if systolic blood pressure (top number) is less than 100, or if pulse if less than 55. (Patient taking differently: Take 30 mg by mouth as needed. Do not take if systolic blood pressure (top number) is less than 100, or if pulse if less than 55.) 180 tablet 3   ELIQUIS 5 MG TABS tablet TAKE 1 TABLET TWICE A DAY 120 tablet 5   Glycerin-Hypromellose-PEG 400 (VISINE DRY EYE OP) Place 1 drop into both eyes 2 (two) times daily as needed (eye irritation).  ketoconazole (NIZORAL) 2 % shampoo Apply 1 application  topically 3 (three) times a week.     leuprolide, 6 Month, (ELIGARD) 45 MG injection Inject 45 mg into the skin every 6 (six) months.     lidocaine (XYLOCAINE) 2 % solution Use as directed 15 mLs in the mouth or throat every 6 (six) hours as needed for mouth pain. 100 mL 2   lidocaine-prilocaine (EMLA) cream Apply to port site 1 to 2 hours prior to appointments/treatment 30 g 3   omeprazole (PRILOSEC) 20 MG capsule Take 1 capsule (20 mg total) by mouth daily. 30 capsule 2   ondansetron (ZOFRAN) 8 MG tablet Take 1 tablet (8 mg total) by mouth every 8 (eight) hours as needed for nausea or vomiting. 30 tablet 1   oxyCODONE (OXY IR/ROXICODONE) 5 MG immediate release tablet Take 1 tablet (5 mg total) by mouth every 4 (four) hours as needed for severe pain (pain  score 7-10). 90 tablet 0   Pancrelipase, Lip-Prot-Amyl, (PANCREAZE) 37000-97300 units CPEP TAKE 2 CAPSULES WITH BREAKFAST, LUNCH, AND EVENING MEAL (WITH EACH MEAL) AND 1 CAPSULE WITH SNACK 600 capsule 5   polyethylene glycol powder (MIRALAX) 17 GM/SCOOP powder Take 17 g by mouth in the morning, at noon, and at bedtime.     prochlorperazine (COMPAZINE) 10 MG tablet Take 1 tablet (10 mg total) by mouth every 6 (six) hours as needed for nausea or vomiting. 30 tablet 2   rosuvastatin (CRESTOR) 10 MG tablet TAKE 1 TABLET DAILY 90 tablet 1   tiZANidine (ZANAFLEX) 2 MG tablet Take 1 tablet (2 mg total) by mouth every 8 (eight) hours as needed for muscle spasms. 10 tablet 0   triamcinolone cream (KENALOG) 0.1 % Apply 1 application topically 2 (two) times daily. (Patient taking differently: Apply 1 application  topically 2 (two) times daily as needed (itching).) 453.6 g 1   zoledronic acid (RECLAST) 5 MG/100ML SOLN injection Inject 5 mg into the vein See admin instructions. Once a year     No current facility-administered medications for this visit.    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 Sclerotic 1.5 cm left ischial lesion/ Sclerotic medial left iliac bone 1.5 cm lesion (series 2/image 47), increased from 1.0 cm. Gleason score of 4+5= 9; with almost all cores involved greater than 80%.  9/16 Lupron 13-month depot on 9/16. [Urology; Dr.Siniski]  # MID OCT 2020- Zytiga 1000 mg+ prednisone; stopped December 2021 [poor tolerance if with RVR]; DISCONTINUED.   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       Prostate cancer metastatic to bone (HCC)  05/24/2019 Initial Diagnosis   Prostate cancer metastatic to bone [facility])    Pancreatic cancer (HCC)  10/02/2021 Imaging   CT ABDOMEN PELVIS W CONTRAST   IMPRESSION: 1. There is new pancreatic ductal dilation with an indeterminate 19 mm hypodense low-density mass area in the head/uncinate process of the pancreas. Recommend further evaluation with dedicated MRI/MRCP with contrast. 2. No evidence of bowel obstruction.  Moderate rectal stool ball. 3. Scattered peripherally located clustered pulmonary nodules in the RIGHT middle lobe. Findings are likely infectious or inflammatory in etiology. Given history of malignancy, recommend follow-up as per clinical protocol.   10/28/2021 Imaging  MR ABDOMEN MRCP W WO CONTAST   IMPRESSION: 1. Examination is significantly limited by breath motion artifact throughout. 2. The main pancreatic duct is diffusely dilated from the level of the superior pancreatic head, measuring up to 0.7 cm. 3. In the inferior pancreatic head and uncinate, there is a multilobulated, fluid signal cystic lesion measuring 1.8 x 1.0 cm. Due to breath motion artifact it is difficult to determine whether this communicates to the adjacent duct. There are multiple additional subcentimeter cystic lesions scattered throughout the pancreas, several of which clearly communicate to the main pancreatic duct. 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 Recommend EUS/FNA for further diagnosis given the presence of pancreatic ductal dilatation. 4. Status post left nephrectomy. 5. No evidence of recurrent or metastatic disease in the abdomen.   12/25/2021 Procedure   UPPER ENDOSCOPIC ULTRASOUND-By Dr. Meridee Score  - A mass-like region was identified in the pancreatic head where the pancreatic duct dilates with multiple cystic regions noted throughout the pancreas as well. 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 However, the endosonographic appearance is suspicious for potential  adenocarcinoma. This was staged T2 N0 Mx by endosonographic criteria. T - No malignant-appearing lymph nodes were visualized in the celiac region (level 20), peripancreatic region and porta hepatis region.   12/25/2021 Pathology Results   CYTOLOGY - NON PAP  CASE: [id number]   FINAL MICROSCOPIC DIAGNOSIS:  A. PANCREAS, HEAD, FINE NEEDLE ASPIRATION:  - Malignant cells consistent with adenocarcinoma     01/01/2022 Initial Diagnosis   Pancreatic cancer (HCC)   01/13/2022 - 04/01/2022 Chemotherapy   Patient is on Treatment Plan : PANCREAS Gemcitabine D1,15 q28d x 4 Cycles     01/26/2022 Cancer Staging   Staging form: Exocrine Pancreas, AJCC 8th Edition - Clinical: Stage IB (cT2, cN0, cM0) - Signed by Malachy Mood, MD on 01/26/2022 Total positive nodes: 0    Genetic Testing   [facility] CancerNext-Expanded Panel was Negative. Of note, a variant of uncertain significance was identified in the BLM gene (p.[id number]) and GALNT12 gene ([id number]). Report date is 01/26/2022.  The CancerNext-Expanded gene panel offered by [facility] and includes sequencing, rearrangement, and RNA analysis for the following 77 genes: AIP, ALK, APC, ATM, AXIN2, BAP1, BARD1, BLM, BMPR1A, BRCA1, BRCA2, BRIP1, CDC73, CDH1, CDK4, CDKN1B, CDKN2A, CHEK2, CTNNA1, DICER1, FANCC, FH, FLCN, GALNT12, KIF1B, LZTR1, MAX, MEN1, MET, MLH1, MSH2, MSH3, MSH6, MUTYH, NBN, NF1, NF2, NTHL1, PALB2, PHOX2B, PMS2, POT1, PRKAR1A, PTCH1, PTEN, RAD51C, RAD51D, RB1, RECQL, RET, SDHA, SDHAF2, SDHB, SDHC, SDHD, SMAD4, SMARCA4, SMARCB1, SMARCE1, STK11, SUFU, TMEM127, TP53, TSC1, TSC2, VHL and XRCC2 (sequencing and deletion/duplication); EGFR, EGLN1, HOXB13, KIT, MITF, PDGFRA, POLD1, and POLE (sequencing only); EPCAM and GREM1 (deletion/duplication only).    08/12/2022 Imaging    IMPRESSION: 1. Slight interval increase in size of the pancreatic head-uncinate process mass with increasing direct contact of the SMA and portal vein as discussed. Also  with a replaced RIGHT hepatic artery which passes through the tumor. 2. No signs of acute inflammation currently about the pancreas. With similar appearance of ductal obstruction and peripheral atrophy of pancreas due to the tumor. 3. Post LEFT nephrectomy. 4. Sclerotic bony lesions of the LEFT ischium and LEFT iliac bone similar to prior imaging. 5. Mild hyperenhancement of LEFT prostate 14 mm with asymmetry, nonspecific. This is unchanged in this patient with history of prostate neoplasm. 6. Aortic atherosclerosis.   07/29/2023 -  Chemotherapy   Patient is on Treatment Plan : PANCREATIC Abraxane  D1,8,15 + Gemcitabine D1,8,15 q28d     08/03/2023 Imaging   CT chest abdomen pelvis with contrast IMPRESSION: 1. No substantial change in size of heterogeneously enhancing pancreatic head mass with adjacent fiducials. 2. Slightly increased main pancreatic ductal dilation and mild intrahepatic bile duct dilation. 3. No evidence of new or progressive metastatic disease in the abdomen or pelvis. 4. Similar mildly sclerotic focus in the left ischial tuberosity dating back to 01/07/2019. A left iliac bone faintly sclerotic lesion measuring 1.4 cm has decreased in density since 2020, likely treated osseous metastasis. 5. Left lower lobe nodules measuring up to 5 mm are unchanged. 6. Aortic Atherosclerosis (ICD10-I70.0).         REVIEW OF SYSTEMS:   Constitutional: Denies fevers or chills.  Does have moderate fatigue.  Has lost 3 pounds since most recent visit. Eyes: Denies blurriness of vision Ears, nose, mouth, throat, and face: Denies mucositis or sore throat Respiratory: Denies cough, dyspnea or wheezes Cardiovascular: Denies palpitation, chest discomfort or lower extremity swelling Gastrointestinal:  Denies nausea, heartburn or change in bowel habits.  Having abdominal discomfort and distention. Skin: Denies abnormal skin rashes Lymphatics: Denies new lymphadenopathy or easy  bruising Neurological:Denies numbness, tingling or new weaknesses Behavioral/Psych: Mood is stable, no new changes  All other systems were reviewed with the patient and are negative.   VITALS:   Today's Vitals   11/18/23 0937  BP: 130/80  Pulse: (!) 140  Resp: 17  Temp: (!) 97.3 F (36.3 C)  TempSrc: Temporal  SpO2: 94%  Weight: 138 lb 14.4 oz (63 kg)  PainSc: 0-No pain   Body mass index is 22.42 kg/m.    Wt Readings from Last 3 Encounters:  11/18/23 138 lb 14.4 oz (63 kg)  11/04/23 141 lb 3.2 oz (64 kg)  11/01/23 138 lb (62.6 kg)    Body mass index is 22.42 kg/m.  Performance status (ECOG): 2 - Symptomatic, <50% confined to bed  PHYSICAL EXAM:   GENERAL:alert, no distress and comfortable SKIN: skin color, texture, turgor are normal, no rashes or significant lesions EYES: normal, Conjunctiva are pink and non-injected, sclera clear OROPHARYNX:no exudate, no erythema and lips, buccal mucosa, and tongue normal  NECK: supple, thyroid normal size, non-tender, without nodularity LYMPH:  no palpable lymphadenopathy in the cervical, axillary or inguinal LUNGS: clear to auscultation and percussion with normal breathing effort HEART: regular rate & rhythm and no murmurs and no lower extremity edema ABDOMEN:abdomen soft, non-tender and normal bowel sounds Musculoskeletal:no cyanosis of digits and no clubbing  NEURO: alert & oriented x 3 with fluent speech, no focal motor/sensory deficits  LABORATORY DATA:  I have reviewed the data as listed    Component Value Date/Time   NA 137 11/18/2023 0844   NA 139 12/04/2021 1359   K 4.3 11/18/2023 0844   CL 107 11/18/2023 0844   CO2 23 11/18/2023 0844   GLUCOSE 140 (H) 11/18/2023 0844   BUN 22 11/18/2023 0844   BUN 20 12/04/2021 1359   CREATININE 0.93 11/18/2023 0844   CALCIUM 9.1 11/18/2023 0844   PROT 6.5 11/18/2023 0844   PROT 6.8 12/04/2021 1359   ALBUMIN 3.4 (L) 11/18/2023 0844   ALBUMIN 4.6 12/04/2021 1359   AST 45  (H) 11/18/2023 0844   ALT 9 11/18/2023 0844   ALKPHOS 64 11/18/2023 0844   BILITOT 1.2 11/18/2023 0844   GFRNONAA >60 11/18/2023 0844   GFRAA 47 (L) 05/06/2020 0935    Lab Results  Component Value Date  WBC 6.1 11/18/2023   NEUTROABS 4.4 11/18/2023   HGB 10.3 (L) 11/18/2023   HCT 32.6 (L) 11/18/2023   MCV 98.8 11/18/2023   PLT 140 (L) 11/18/2023       RADIOGRAPHIC STUDIES: DG SWALLOW FUNC OP MEDICARE SPEECH PATH Result Date: 11/10/2023 Table formatting from the original result was not included. Modified Barium Swallow Study Patient Details Name: [name] [name] MRN: [id number] Date of Birth: Nov 29, 1940 Today's Date: 11/10/2023 HPI/PMH: HPI: 83 yo male with h/o Parkinson's disease, prostate cancer with bone mets, femur fx, RBD, long term use of anticoagulants, lung nodules, infection due to port-a-cath, pancreatic cancer, dysrhythima, lumber fracture, sleep apnea, bronchial biopsy, BPH, diverticulosis, Atrial flutter.  PSH includes kidney donation, hip surgery, portacath placement. He has  dysphagia to solids more than liquids but reports occasional coughing with liquids.  Pt states he has lost weight due to his pancreatic cancer.  He denies requiring heimlich manuever nor having recurrent pneumonias.  He takes his medications with smooth food to get them down.  Admits to worsening dysphagia over the last 4-6 months and reports voice is weaker. He suspects his dysphagia is due to Parkinson's and deconditioning with direct question cue. Clinical Impression: Clinical Impression: Patient presents with moderate pharyngeal phase dysphagia with sensorimotor deficits.  He demonstrates impaired tongue base retraction, pharyngeal motility and laryngeal elevation/closure resulting in retention *more vallecular than pyriform sinus* and laryngeal penetration *trace aspiratio of thin liquids with sequential swallows.  Chin tuck posture nor cued effortful swallow was helpful to prevent laryngeal penetration  nor aid pharyngeal clearance.  Following solids with liquids and dry swallows helpful however.  Trace silent aspiration noted with sequential swallows.  Barium tablet taken with pudding lodged in vallecular space without pt sensation. Pudding bolus with chin tuck and effortful swallow finally transited tablet into esophagus.  In  A-P view, pharyngeal retention was bilateral.  Recommend continue diet with precautions including starting intake with liquids, following solids with liquids, and multiple swallows with boluses. Also advise occassional strong cough and expectoration, especially in light of lack of sensation to retention and mild aspiration.  Also highly advise follow up with SLP to prescribe dysphagia exercises to strengthen pt's motlity and cough response for maximal swallow efficiency and airway protection.  DIGEST scoring of pt's swallow included efficiency grade 3 (50-90% retained) which is within severe range and safety grade 2 (minimal intermittent aspiration= moderate.  Using teach back, including review of MBS flouroscopy loops, pt educated to recommendations.  Thanks for this consult. Factors that may increase risk of adverse event in presence of aspiration Rubye Oaks & Clearance Coots 2021): Factors that may increase risk of adverse event in presence of aspiration Rubye Oaks & Clearance Coots 2021): Limited mobility; Frail or deconditioned; Reduced saliva; Poor general health and/or compromised immunity; Weak cough Recommendations/Plan: Swallowing Evaluation Recommendations Swallowing Evaluation Recommendations Recommendations: PO diet PO Diet Recommendation: Regular; Dysphagia 3 (Mechanical soft); Thin liquids (Level 0) Liquid Administration via: Cup; Straw Medication Administration: Whole meds with puree Supervision: Patient able to self-feed Swallowing strategies  : Small bites/sips; Slow rate; Multiple dry swallows after each bite/sip; Follow solids with liquids Postural changes: Position pt fully upright for  meals; Stay upright 30-60 min after meals Oral care recommendations: Oral care QID (4x/day) Treatment Plan Treatment Plan Follow-up recommendations: Outpatient SLP Functional status assessment: Patient has had a recent decline in their functional status and demonstrates the ability to make significant improvements in function in a reasonable and predictable amount of time. Recommendations Recommendations for follow up  therapy are one component of a multi-disciplinary discharge planning process, led by the attending physician.  Recommendations may be updated based on patient status, additional functional criteria and insurance authorization. Assessment: Orofacial Exam: Orofacial Exam Oral Cavity: Oral Hygiene: WFL Oral Cavity - Dentition: Adequate natural dentition Orofacial Anatomy: WFL Oral Motor/Sensory Function: Generalized oral weakness Anatomy: Anatomy: WFL Boluses Administered: Boluses Administered Boluses Administered: Thin liquids (Level 0); Mildly thick liquids (Level 2, nectar thick); Moderately thick liquids (Level 3, honey thick); Puree; Solid  Oral Impairment Domain: Oral Impairment Domain Lip Closure: No labial escape Tongue control during bolus hold: Cohesive bolus between tongue to palatal seal Bolus preparation/mastication: Timely and efficient chewing and mashing Bolus transport/lingual motion: Brisk tongue motion Oral residue: Trace residue lining oral structures Location of oral residue : Tongue Initiation of pharyngeal swallow : Valleculae; Pyriform sinuses  Pharyngeal Impairment Domain: Pharyngeal Impairment Domain Soft palate elevation: No bolus between soft palate (SP)/pharyngeal wall (PW) Laryngeal elevation: Partial superior movement of thyroid cartilage/partial approximation of arytenoids to epiglottic petiole Anterior hyoid excursion: Partial anterior movement Epiglottic movement: Partial inversion Laryngeal vestibule closure: Incomplete, narrow column air/contrast in laryngeal vestibule  Pharyngeal stripping wave : Present - diminished Pharyngeal contraction (A/P view only): -- (bilateral clearance) Pharyngoesophageal segment opening: Partial distention/partial duration, partial obstruction of flow Tongue base retraction: Wide column of contrast or air between tongue base and PPW Pharyngeal residue: Collection of residue within or on pharyngeal structures Location of pharyngeal residue: Valleculae; Pyriform sinuses  Esophageal Impairment Domain: Esophageal Impairment Domain Esophageal clearance upright position: Esophageal retention Pill: Pill Consistency administered: Puree Puree: Impaired (see clinical impressions) Penetration/Aspiration Scale Score: Penetration/Aspiration Scale Score 1.  Material does not enter airway: Mildly thick liquids (Level 2, nectar thick); Moderately thick liquids (Level 3, honey thick); Puree; Solid; Pill 8.  Material enters airway, passes BELOW cords without attempt by patient to eject out (silent aspiration) : Thin liquids (Level 0) Compensatory Strategies: Compensatory Strategies Compensatory strategies: Yes Effortful swallow: Ineffective Ineffective Effortful Swallow: Puree; Solid Multiple swallows: Effective Effective Multiple Swallows: Mildly thick liquid (Level 2, nectar thick); Thin liquid (Level 0) Chin tuck: Ineffective Ineffective Chin Tuck: Thin liquid (Level 0)   General Information: Caregiver present: No  Diet Prior to this Study: Regular; Thin liquids (Level 0); Dysphagia 3 (mechanical soft)   Temperature : Normal   Respiratory Status: WFL   Supplemental O2: None (Room air)   History of Recent Intubation: No  Behavior/Cognition: Alert; Cooperative; Pleasant mood Self-Feeding Abilities: Able to self-feed Baseline vocal quality/speech: Hypophonia/low volume Volitional Cough: Able to elicit Volitional Swallow: Able to elicit Exam Limitations: No limitations Goal Planning: No data recorded No data recorded No data recorded No data recorded No data recorded  Pain: Pain Assessment Pain Assessment: No/denies pain End of Session: Start Time:SLP Start Time (ACUTE ONLY): 1309 Stop Time: SLP Stop Time (ACUTE ONLY): 1350 Time Calculation:SLP Time Calculation (min) (ACUTE ONLY): 41 min Charges: SLP Evaluations $ SLP Speech Visit: 1 Visit SLP Evaluations $Outpatient MBS Swallow: 1 Procedure $Swallowing Treatment: 1 Procedure SLP visit diagnosis: SLP Visit Diagnosis: Dysphagia, pharyngeal phase (R13.13); Dysphagia, pharyngoesophageal phase (R13.14) Past Medical History: Past Medical History: Diagnosis Date  Atrial flutter (HCC)   BPH (benign prostatic hyperplasia)   Cancer (HCC)   PROSTATE  Diverticulosis 2015  Dysrhythmia   Parkinson's disease (HCC)   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 ENDOSCOPY;  Service: Gastroenterology;;  BRONCHIAL BIOPSY  07/05/2023  Procedure: BRONCHIAL BIOPSIES;  Surgeon: Josephine Igo, DO;  Location: [facility] ENDOSCOPY;  Service: Pulmonary;;  COLONOSCOPY  2010, 2015  ESOPHAGOGASTRODUODENOSCOPY N/A 04/16/2022  Procedure: ESOPHAGOGASTRODUODENOSCOPY (EGD);  Surgeon: Lemar Lofty., MD;  Location: [facility] ENDOSCOPY;  Service: Gastroenterology;  Laterality: N/A;  ESOPHAGOGASTRODUODENOSCOPY (EGD) WITH PROPOFOL N/A 12/25/2021  Procedure: ESOPHAGOGASTRODUODENOSCOPY (EGD) WITH PROPOFOL;  Surgeon: Meridee Score Netty Starring., MD;  Location: [facility] ENDOSCOPY;  Service: Gastroenterology;  Laterality: N/A;  EUS N/A 12/25/2021  Procedure: UPPER  ENDOSCOPIC ULTRASOUND (EUS) RADIAL;  Surgeon: Lemar Lofty., MD;  Location: [facility] ENDOSCOPY;  Service: Gastroenterology;  Laterality: N/A;  EUS N/A 04/16/2022  Procedure: UPPER ENDOSCOPIC ULTRASOUND (EUS) RADIAL;  Surgeon: Lemar Lofty., MD;  Location: [facility] ENDOSCOPY;  Service: Gastroenterology;  Laterality: N/A;  FIDUCIAL MARKER PLACEMENT N/A 04/16/2022  Procedure: FIDUCIAL MARKER PLACEMENT;  Surgeon: Lemar Lofty., MD;  Location: [facility] ENDOSCOPY;  Service: Gastroenterology;  Laterality: N/A;  FINE NEEDLE ASPIRATION  12/25/2021  Procedure: FINE NEEDLE ASPIRATION (FNA) LINEAR;  Surgeon: Lemar Lofty., MD;  Location: [facility] ENDOSCOPY;  Service: Gastroenterology;;  IR IMAGING GUIDED PORT INSERTION  07/28/2023  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  KYPHOPLASTY N/A 08/15/2020  Procedure: L2 compression fracture;  Surgeon: Kennedy Bucker, MD;  Location: [facility] ORS;  Service: Orthopedics;  Laterality: N/A;  KYPHOPLASTY N/A 08/29/2020  Procedure: T8 KYPHOPLASTY;  Surgeon: Kennedy Bucker, MD;  Location: [facility] ORS;  Service: Orthopedics;  Laterality: N/A;  KYPHOPLASTY N/A 11/12/2020  Procedure: L1 KYPHOPLASTY;  Surgeon: Kennedy Bucker, MD;  Location: [facility] ORS;  Service: Orthopedics;  Laterality: N/A;  PORTACATH PLACEMENT N/A 02/05/2022  Procedure: INSERTION PORT-A-CATH;  Surgeon: Fritzi Mandes, MD;  Location: [facility] ORS;  Service: General;  Laterality: N/A; Rolena Infante, MS [facility] SLP Acute Rehab Services Office [phone] Chales Abrahams 11/10/2023, 3:17 PM CLINICAL DATA:  Dysphagia. Cough/GE reflux disease/other secondary diagnosis EXAM: MODIFIED BARIUM SWALLOW TECHNIQUE: Different consistencies of barium were administered orally to the patient by the Speech Pathologist. Imaging of the pharynx was performed in the lateral projection. Radiologist in attendance for the exam. Different consistencies of barium were administered orally to the patient by the Speech Pathologist. Imaging of the pharynx was performed in the lateral  projection. The radiologist was present in the fluoroscopy room for this study, providing personal supervision. FLUOROSCOPY TIME:  Radiation Exposure Index (as provided by the fluoroscopic device): 2 minutes 24 seconds 176.88 micro gray meter squared COMPARISON:  None Available. FINDINGS: Modified barium swallow was performed by the speech pathologist. Please refer to the Speech Pathology report for results and recommendations. Penetration and occasional small volume aspiration observed with thin liquid consistency. Pooling in the vallecula with most consistencies. Barium tablet arrest at the vallecula until multiple swallows move it into the esophagus. IMPRESSION: Please refer to the Speech Pathologists report for complete details and recommendations. Electronically Signed   By: Paulina Fusi M.D.   On: 11/10/2023 13:57

## 2023-11-17 NOTE — Assessment & Plan Note (Signed)
 diagnosed in 09/2021, deemed poor surgical candidate due to his age and medical co-morbidities -S/p single agent gemcitabine 01/13/22 - 04/01/22 and 5 fx SBRT [name]) 05/04/22 - 05/14/22. CA 19-9 decreased after treatment -Surveillance CT 11/09/2022 showed slightly decreased size of pancreatic head mass, no evidence of metastatic disease.  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  He is taking ibuprofen as needed.  No other clinical concern for cancer progression. -Lab reviewed, overall stable, tumor marker still pending. -He was admitted for right femoral neck fracture  on 02/03/23 after a fall and had surgery  -CT scan from May 17, 2023 showed multiple lung nodules, with dominant 1.5 cm nodule in the right middle lobe, highly concerning for metastasis.  His lung lesions are positive on PET scan. -Bronchoscopy and biopsy attempted, which showed atypical cells. -He restarted chemo gemcitabine and abraxane on 07/29/2023. -restaging CT 10/01/2023 showed stable disease  -Due to increased fatigue, chemo changed to single agent gemcitabine on October 21, 2023 -- Increased dysphagia.  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  PT has been recommended to help with swallowing and improve robustness of cough reflex.  Patient waiting to hear about initial appointments. Continue with single agent gemcitabine.  Restaging CT CAP due for end of April 2025.  This was ordered today.

## 2023-11-18 ENCOUNTER — Inpatient Hospital Stay (HOSPITAL_BASED_OUTPATIENT_CLINIC_OR_DEPARTMENT_OTHER): Payer: Medicare Other | Admitting: Nurse Practitioner

## 2023-11-18 ENCOUNTER — Inpatient Hospital Stay: Payer: Medicare Other

## 2023-11-18 ENCOUNTER — Encounter: Payer: Self-pay | Admitting: Hematology

## 2023-11-18 ENCOUNTER — Ambulatory Visit: Payer: Medicare Other | Admitting: Neurology

## 2023-11-18 ENCOUNTER — Inpatient Hospital Stay: Payer: Medicare Other | Attending: Internal Medicine

## 2023-11-18 VITALS — BP 120/93 | HR 112 | Temp 98.1°F | Resp 20

## 2023-11-18 VITALS — BP 130/80 | HR 140 | Temp 97.3°F | Resp 17 | Wt 138.9 lb

## 2023-11-18 DIAGNOSIS — R5383 Other fatigue: Secondary | ICD-10-CM | POA: Insufficient documentation

## 2023-11-18 DIAGNOSIS — I700 Atherosclerosis of aorta: Secondary | ICD-10-CM | POA: Insufficient documentation

## 2023-11-18 DIAGNOSIS — R140 Abdominal distension (gaseous): Secondary | ICD-10-CM | POA: Diagnosis not present

## 2023-11-18 DIAGNOSIS — Z905 Acquired absence of kidney: Secondary | ICD-10-CM | POA: Diagnosis not present

## 2023-11-18 DIAGNOSIS — Z79631 Long term (current) use of antimetabolite agent: Secondary | ICD-10-CM | POA: Insufficient documentation

## 2023-11-18 DIAGNOSIS — Z7901 Long term (current) use of anticoagulants: Secondary | ICD-10-CM | POA: Diagnosis not present

## 2023-11-18 DIAGNOSIS — Z9049 Acquired absence of other specified parts of digestive tract: Secondary | ICD-10-CM | POA: Insufficient documentation

## 2023-11-18 DIAGNOSIS — C250 Malignant neoplasm of head of pancreas: Secondary | ICD-10-CM

## 2023-11-18 DIAGNOSIS — C7951 Secondary malignant neoplasm of bone: Secondary | ICD-10-CM | POA: Insufficient documentation

## 2023-11-18 DIAGNOSIS — C61 Malignant neoplasm of prostate: Secondary | ICD-10-CM | POA: Diagnosis not present

## 2023-11-18 DIAGNOSIS — N183 Chronic kidney disease, stage 3 unspecified: Secondary | ICD-10-CM | POA: Insufficient documentation

## 2023-11-18 DIAGNOSIS — Z515 Encounter for palliative care: Secondary | ICD-10-CM | POA: Diagnosis not present

## 2023-11-18 DIAGNOSIS — K861 Other chronic pancreatitis: Secondary | ICD-10-CM | POA: Insufficient documentation

## 2023-11-18 DIAGNOSIS — D649 Anemia, unspecified: Secondary | ICD-10-CM | POA: Insufficient documentation

## 2023-11-18 DIAGNOSIS — Z887 Allergy status to serum and vaccine status: Secondary | ICD-10-CM | POA: Insufficient documentation

## 2023-11-18 DIAGNOSIS — R1310 Dysphagia, unspecified: Secondary | ICD-10-CM | POA: Insufficient documentation

## 2023-11-18 DIAGNOSIS — K5900 Constipation, unspecified: Secondary | ICD-10-CM | POA: Diagnosis not present

## 2023-11-18 DIAGNOSIS — R918 Other nonspecific abnormal finding of lung field: Secondary | ICD-10-CM | POA: Insufficient documentation

## 2023-11-18 DIAGNOSIS — Z5111 Encounter for antineoplastic chemotherapy: Secondary | ICD-10-CM | POA: Diagnosis not present

## 2023-11-18 DIAGNOSIS — G20A1 Parkinson's disease without dyskinesia, without mention of fluctuations: Secondary | ICD-10-CM | POA: Diagnosis not present

## 2023-11-18 DIAGNOSIS — Z95828 Presence of other vascular implants and grafts: Secondary | ICD-10-CM

## 2023-11-18 DIAGNOSIS — Z79899 Other long term (current) drug therapy: Secondary | ICD-10-CM | POA: Insufficient documentation

## 2023-11-18 DIAGNOSIS — Z87891 Personal history of nicotine dependence: Secondary | ICD-10-CM | POA: Insufficient documentation

## 2023-11-18 DIAGNOSIS — I482 Chronic atrial fibrillation, unspecified: Secondary | ICD-10-CM | POA: Diagnosis not present

## 2023-11-18 DIAGNOSIS — M545 Low back pain, unspecified: Secondary | ICD-10-CM | POA: Diagnosis not present

## 2023-11-18 LAB — CMP (CANCER CENTER ONLY)
ALT: 9 U/L (ref 0–44)
AST: 45 U/L — ABNORMAL HIGH (ref 15–41)
Albumin: 3.4 g/dL — ABNORMAL LOW (ref 3.5–5.0)
Alkaline Phosphatase: 64 U/L (ref 38–126)
Anion gap: 7 (ref 5–15)
BUN: 22 mg/dL (ref 8–23)
CO2: 23 mmol/L (ref 22–32)
Calcium: 9.1 mg/dL (ref 8.9–10.3)
Chloride: 107 mmol/L (ref 98–111)
Creatinine: 0.93 mg/dL (ref 0.61–1.24)
GFR, Estimated: >60 mL/min (ref >60)
Glucose, Bld: 140 mg/dL — ABNORMAL HIGH (ref 70–99)
Potassium: 4.3 mmol/L (ref 3.5–5.1)
Sodium: 137 mmol/L (ref 135–145)
Total Bilirubin: 1.2 mg/dL (ref 0.0–1.2)
Total Protein: 6.5 g/dL (ref 6.5–8.1)

## 2023-11-18 LAB — CBC WITH DIFFERENTIAL (CANCER CENTER ONLY)
Abs Immature Granulocytes: 0.02 10*3/uL (ref 0.00–0.07)
Basophils Absolute: 0 10*3/uL (ref 0.0–0.1)
Basophils Relative: 0 %
Eosinophils Absolute: 0.1 10*3/uL (ref 0.0–0.5)
Eosinophils Relative: 2 %
HCT: 32.6 % — ABNORMAL LOW (ref 39.0–52.0)
Hemoglobin: 10.3 g/dL — ABNORMAL LOW (ref 13.0–17.0)
Immature Granulocytes: 0 %
Lymphocytes Relative: 17 %
Lymphs Abs: 1.1 10*3/uL (ref 0.7–4.0)
MCH: 31.2 pg (ref 26.0–34.0)
MCHC: 31.6 g/dL (ref 30.0–36.0)
MCV: 98.8 fL (ref 80.0–100.0)
Monocytes Absolute: 0.5 10*3/uL (ref 0.1–1.0)
Monocytes Relative: 9 %
Neutro Abs: 4.4 10*3/uL (ref 1.7–7.7)
Neutrophils Relative %: 72 %
Platelet Count: 140 10*3/uL — ABNORMAL LOW (ref 150–400)
RBC: 3.3 MIL/uL — ABNORMAL LOW (ref 4.22–5.81)
RDW: 18.1 % — ABNORMAL HIGH (ref 11.5–15.5)
WBC Count: 6.1 10*3/uL (ref 4.0–10.5)
nRBC: 0 % (ref 0.0–0.2)

## 2023-11-18 MED ORDER — SODIUM CHLORIDE 0.9 % IV SOLN: INTRAVENOUS | Status: DC

## 2023-11-18 MED ORDER — HEPARIN SOD (PORK) LOCK FLUSH 100 UNIT/ML IV SOLN
500.0000 [IU] | Freq: Once | INTRAVENOUS | Status: AC | PRN
  Administered 2023-11-18: 500 [IU]

## 2023-11-18 MED ORDER — SODIUM CHLORIDE 0.9% FLUSH
10.0000 mL | INTRAVENOUS | Status: DC | PRN
  Administered 2023-11-18: 10 mL

## 2023-11-18 MED ORDER — SODIUM CHLORIDE 0.9 % IV SOLN
800.0000 mg/m2 | Freq: Once | INTRAVENOUS | Status: AC
  Administered 2023-11-18: 1406 mg via INTRAVENOUS
  Filled 2023-11-18: qty 36.98

## 2023-11-18 MED ORDER — SODIUM CHLORIDE 0.9% FLUSH
10.0000 mL | Freq: Once | INTRAVENOUS | Status: AC
  Administered 2023-11-18: 10 mL

## 2023-11-18 MED ORDER — PROCHLORPERAZINE MALEATE 10 MG PO TABS
10.0000 mg | ORAL_TABLET | Freq: Once | ORAL | Status: AC
Start: 2023-11-18 — End: 2023-11-18
  Administered 2023-11-18: 10 mg via ORAL
  Filled 2023-11-18: qty 1

## 2023-11-18 NOTE — Progress Notes (Signed)
 Patient tolerated treatment well, heart rate now between 60 and 117 still asymptomatic, patient denies any pain or distress.

## 2023-11-18 NOTE — Patient Instructions (Signed)
 [facility]  Discharge Instructions: Thank you for choosing [facility] to provide your oncology and hematology care.   If you have a lab appointment with the Cancer Center, please go directly to the Cancer Center and check in at the registration area.   Wear comfortable clothing and clothing appropriate for easy access to any Portacath or PICC line.   We strive to give you quality time with your provider. You may need to reschedule your appointment if you arrive late (15 or more minutes).  Arriving late affects you and other patients whose appointments are after yours.  Also, if you miss three or more appointments without notifying the office, you may be dismissed from the clinic at the provider's discretion.      For prescription refill requests, have your pharmacy contact our office and allow 72 hours for refills to be completed.    Today you received the following chemotherapy and/or immunotherapy agents: gemcitabine (GEMZAR)    To help prevent nausea and vomiting after your treatment, we encourage you to take your nausea medication as directed.  BELOW ARE SYMPTOMS THAT SHOULD BE REPORTED IMMEDIATELY: *FEVER GREATER THAN 100.4 F (38 C) OR HIGHER *CHILLS OR SWEATING *NAUSEA AND VOMITING THAT IS NOT CONTROLLED WITH YOUR NAUSEA MEDICATION *UNUSUAL SHORTNESS OF BREATH *UNUSUAL BRUISING OR BLEEDING *URINARY PROBLEMS (pain or burning when urinating, or frequent urination) *BOWEL PROBLEMS (unusual diarrhea, constipation, pain near the anus) TENDERNESS IN MOUTH AND THROAT WITH OR WITHOUT PRESENCE OF ULCERS (sore throat, sores in mouth, or a toothache) UNUSUAL RASH, SWELLING OR PAIN  UNUSUAL VAGINAL DISCHARGE OR ITCHING   Items with * indicate a potential emergency and should be followed up as soon as possible or go to the Emergency Department if any problems should occur.  Please show the CHEMOTHERAPY ALERT CARD or  IMMUNOTHERAPY ALERT CARD at check-in to the Emergency Department and triage nurse.  Should you have questions after your visit or need to cancel or reschedule your appointment, please contact [facility] - [facility]  Dept: [phone]  and follow the prompts.  Office hours are 8:00 a.m. to 4:30 p.m. Monday - Friday. Please note that voicemails left after 4:00 p.m. may not be returned until the following business day.  We are closed weekends and major holidays. You have access to a nurse at all times for urgent questions. Please call the main number to the clinic Dept: [phone] and follow the prompts.   For any non-urgent questions, you may also contact your provider using MyChart. We now offer e-Visits for anyone 83 and older to request care online for non-urgent symptoms. For details visit [email or web address].   Also download the MyChart app! Go to the app store, search "MyChart", open the app, select [place], and log in with your MyChart username and password.   Gemcitabine Injection What is this medication? GEMCITABINE (jem SYE ta been) treats some types of cancer. It works by slowing down the growth of cancer cells. This medicine may be used for other purposes; ask your health care provider or pharmacist if you have questions. COMMON BRAND NAME(S): Gemzar, Infugem What should I tell my care team before I take this medication? They need to know if you have any of these conditions: Blood disorders Infection Kidney disease Liver disease Lung or breathing disease, such as asthma or COPD Recent or ongoing  radiation therapy An unusual or allergic reaction to gemcitabine, other medications, foods, dyes, or preservatives If you or your partner are pregnant or trying to get pregnant Breast-feeding How should I use this medication? This medication is injected into a vein. It is given by your care team in a hospital or clinic setting. Talk  to your care team about the use of this medication in children. Special care may be needed. Overdosage: If you think you have taken too much of this medicine contact a poison control center or emergency room at once. NOTE: This medicine is only for you. Do not share this medicine with others. What if I miss a dose? Keep appointments for follow-up doses. It is important not to miss your dose. Call your care team if you are unable to keep an appointment. What may interact with this medication? Interactions have not been studied. This list may not describe all possible interactions. Give your health care provider a list of all the medicines, herbs, non-prescription drugs, or dietary supplements you use. Also tell them if you smoke, drink alcohol, or use illegal drugs. Some items may interact with your medicine. What should I watch for while using this medication? Your condition will be monitored carefully while you are receiving this medication. This medication may make you feel generally unwell. This is not uncommon, as chemotherapy can affect healthy cells as well as cancer cells. Report any side effects. Continue your course of treatment even though you feel ill unless your care team tells you to stop. In some cases, you may be given additional medications to help with side effects. Follow all directions for their use. This medication may increase your risk of getting an infection. Call your care team for advice if you get a fever, chills, sore throat, or other symptoms of a cold or flu. Do not treat yourself. Try to avoid being around people who are sick. This medication may increase your risk to bruise or bleed. Call your care team if you notice any unusual bleeding. Be careful brushing or flossing your teeth or using a toothpick because you may get an infection or bleed more easily. If you have any dental work done, tell your dentist you are receiving this medication. Avoid taking medications that  contain aspirin, acetaminophen, ibuprofen, naproxen, or ketoprofen unless instructed by your care team. These medications may hide a fever. Talk to your care team if you or your partner wish to become pregnant or think you might be pregnant. This medication can cause serious birth defects if taken during pregnancy and for 6 months after the last dose. A negative pregnancy test is required before starting this medication. A reliable form of contraception is recommended while taking this medication and for 6 months after the last dose. Talk to your care team about effective forms of contraception. Do not father a child while taking this medication and for 3 months after the last dose. Use a condom while having sex during this time period. Do not breastfeed while taking this medication and for at least 1 week after the last dose. This medication may cause infertility. Talk to your care team if you are concerned about your fertility. What side effects may I notice from receiving this medication? Side effects that you should report to your care team as soon as possible: Allergic reactions--skin rash, itching, hives, swelling of the face, lips, tongue, or throat Capillary leak syndrome--stomach or muscle pain, unusual weakness or fatigue, feeling faint or lightheaded, decrease in  the amount of urine, swelling of the ankles, hands, or feet, trouble breathing Infection--fever, chills, cough, sore throat, wounds that don't heal, pain or trouble when passing urine, general feeling of discomfort or being unwell Liver injury--right upper belly pain, loss of appetite, nausea, light-colored stool, dark yellow or brown urine, yellowing skin or eyes, unusual weakness or fatigue Low red blood cell level--unusual weakness or fatigue, dizziness, headache, trouble breathing Lung injury--shortness of breath or trouble breathing, cough, spitting up blood, chest pain, fever Stomach pain, bloody diarrhea, pale skin, unusual  weakness or fatigue, decrease in the amount of urine, which may be signs of hemolytic uremic syndrome Sudden and severe headache, confusion, change in vision, seizures, which may be signs of posterior reversible encephalopathy syndrome (PRES) Unusual bruising or bleeding Side effects that usually do not require medical attention (report to your care team if they continue or are bothersome): Diarrhea Drowsiness Hair loss Nausea Pain, redness, or swelling with sores inside the mouth or throat Vomiting This list may not describe all possible side effects. Call your doctor for medical advice about side effects. You may report side effects to FDA at 1-800-FDA-1088. Where should I keep my medication? This medication is given in a hospital or clinic. It will not be stored at home. NOTE: This sheet is a summary. It may not cover all possible information. If you have questions about this medicine, talk to your doctor, pharmacist, or health care provider.  2024 Elsevier/Gold Standard (2021-12-30 00:00:00)

## 2023-11-19 ENCOUNTER — Encounter: Payer: Self-pay | Admitting: Internal Medicine

## 2023-11-19 ENCOUNTER — Encounter: Payer: Self-pay | Admitting: Nurse Practitioner

## 2023-11-19 ENCOUNTER — Encounter: Payer: Self-pay | Admitting: Hematology

## 2023-11-19 LAB — CANCER ANTIGEN 19-9: CA 19-9: 429 U/mL — ABNORMAL HIGH (ref 0–35)

## 2023-11-23 ENCOUNTER — Other Ambulatory Visit: Payer: Self-pay | Admitting: Cardiology

## 2023-11-23 DIAGNOSIS — I4891 Unspecified atrial fibrillation: Secondary | ICD-10-CM

## 2023-11-23 NOTE — Progress Notes (Signed)
 LTC Claim forms completed and e-mailed to [employer] as requested. No other needs or concerns noted at this time.

## 2023-11-24 DIAGNOSIS — R2689 Other abnormalities of gait and mobility: Secondary | ICD-10-CM | POA: Diagnosis not present

## 2023-11-24 DIAGNOSIS — M6281 Muscle weakness (generalized): Secondary | ICD-10-CM | POA: Diagnosis not present

## 2023-11-26 DIAGNOSIS — R2689 Other abnormalities of gait and mobility: Secondary | ICD-10-CM | POA: Diagnosis not present

## 2023-11-26 DIAGNOSIS — M6281 Muscle weakness (generalized): Secondary | ICD-10-CM | POA: Diagnosis not present

## 2023-11-29 DIAGNOSIS — M6281 Muscle weakness (generalized): Secondary | ICD-10-CM | POA: Diagnosis not present

## 2023-11-29 DIAGNOSIS — R2689 Other abnormalities of gait and mobility: Secondary | ICD-10-CM | POA: Diagnosis not present

## 2023-12-02 ENCOUNTER — Inpatient Hospital Stay (HOSPITAL_BASED_OUTPATIENT_CLINIC_OR_DEPARTMENT_OTHER): Admitting: Nurse Practitioner

## 2023-12-02 ENCOUNTER — Encounter: Payer: Self-pay | Admitting: Hematology

## 2023-12-02 ENCOUNTER — Inpatient Hospital Stay: Payer: Medicare Other

## 2023-12-02 ENCOUNTER — Encounter: Payer: Self-pay | Admitting: Nurse Practitioner

## 2023-12-02 ENCOUNTER — Other Ambulatory Visit: Payer: Self-pay

## 2023-12-02 ENCOUNTER — Inpatient Hospital Stay (HOSPITAL_BASED_OUTPATIENT_CLINIC_OR_DEPARTMENT_OTHER): Payer: Medicare Other | Admitting: Hematology

## 2023-12-02 VITALS — BP 111/91 | HR 129 | Temp 98.3°F | Resp 17 | Wt 139.4 lb

## 2023-12-02 VITALS — BP 117/87 | HR 98 | Resp 20

## 2023-12-02 DIAGNOSIS — R110 Nausea: Secondary | ICD-10-CM | POA: Diagnosis not present

## 2023-12-02 DIAGNOSIS — C250 Malignant neoplasm of head of pancreas: Secondary | ICD-10-CM

## 2023-12-02 DIAGNOSIS — C61 Malignant neoplasm of prostate: Secondary | ICD-10-CM

## 2023-12-02 DIAGNOSIS — R1310 Dysphagia, unspecified: Secondary | ICD-10-CM | POA: Diagnosis not present

## 2023-12-02 DIAGNOSIS — C7951 Secondary malignant neoplasm of bone: Secondary | ICD-10-CM

## 2023-12-02 DIAGNOSIS — G893 Neoplasm related pain (acute) (chronic): Secondary | ICD-10-CM | POA: Diagnosis not present

## 2023-12-02 DIAGNOSIS — K5903 Drug induced constipation: Secondary | ICD-10-CM | POA: Diagnosis not present

## 2023-12-02 DIAGNOSIS — Z515 Encounter for palliative care: Secondary | ICD-10-CM

## 2023-12-02 DIAGNOSIS — Z95828 Presence of other vascular implants and grafts: Secondary | ICD-10-CM

## 2023-12-02 DIAGNOSIS — C259 Malignant neoplasm of pancreas, unspecified: Secondary | ICD-10-CM

## 2023-12-02 DIAGNOSIS — Z5111 Encounter for antineoplastic chemotherapy: Secondary | ICD-10-CM | POA: Diagnosis not present

## 2023-12-02 LAB — CBC WITH DIFFERENTIAL (CANCER CENTER ONLY)
Abs Immature Granulocytes: 0.03 10*3/uL (ref 0.00–0.07)
Basophils Absolute: 0 10*3/uL (ref 0.0–0.1)
Basophils Relative: 1 %
Eosinophils Absolute: 0.1 10*3/uL (ref 0.0–0.5)
Eosinophils Relative: 1 %
HCT: 30.7 % — ABNORMAL LOW (ref 39.0–52.0)
Hemoglobin: 9.7 g/dL — ABNORMAL LOW (ref 13.0–17.0)
Immature Granulocytes: 1 %
Lymphocytes Relative: 20 %
Lymphs Abs: 1.1 10*3/uL (ref 0.7–4.0)
MCH: 30.5 pg (ref 26.0–34.0)
MCHC: 31.6 g/dL (ref 30.0–36.0)
MCV: 96.5 fL (ref 80.0–100.0)
Monocytes Absolute: 0.8 10*3/uL (ref 0.1–1.0)
Monocytes Relative: 14 %
Neutro Abs: 3.6 10*3/uL (ref 1.7–7.7)
Neutrophils Relative %: 63 %
Platelet Count: 162 10*3/uL (ref 150–400)
RBC: 3.18 MIL/uL — ABNORMAL LOW (ref 4.22–5.81)
RDW: 18.5 % — ABNORMAL HIGH (ref 11.5–15.5)
WBC Count: 5.6 10*3/uL (ref 4.0–10.5)
nRBC: 0 % (ref 0.0–0.2)

## 2023-12-02 LAB — CMP (CANCER CENTER ONLY)
ALT: 8 U/L (ref 0–44)
AST: 34 U/L (ref 15–41)
Albumin: 3.7 g/dL (ref 3.5–5.0)
Alkaline Phosphatase: 73 U/L (ref 38–126)
Anion gap: 3 — ABNORMAL LOW (ref 5–15)
BUN: 22 mg/dL (ref 8–23)
CO2: 28 mmol/L (ref 22–32)
Calcium: 9.4 mg/dL (ref 8.9–10.3)
Chloride: 109 mmol/L (ref 98–111)
Creatinine: 0.95 mg/dL (ref 0.61–1.24)
GFR, Estimated: >60 mL/min (ref >60)
Glucose, Bld: 154 mg/dL — ABNORMAL HIGH (ref 70–99)
Potassium: 4.3 mmol/L (ref 3.5–5.1)
Sodium: 140 mmol/L (ref 135–145)
Total Bilirubin: 1 mg/dL (ref 0.0–1.2)
Total Protein: 6.5 g/dL (ref 6.5–8.1)

## 2023-12-02 MED ORDER — PROCHLORPERAZINE MALEATE 10 MG PO TABS
10.0000 mg | ORAL_TABLET | Freq: Once | ORAL | Status: AC
  Administered 2023-12-02: 10 mg via ORAL
  Filled 2023-12-02: qty 1

## 2023-12-02 MED ORDER — SODIUM CHLORIDE 0.9 % IV SOLN
800.0000 mg/m2 | Freq: Once | INTRAVENOUS | Status: AC
  Administered 2023-12-02: 1406 mg via INTRAVENOUS
  Filled 2023-12-02: qty 36.98

## 2023-12-02 MED ORDER — SODIUM CHLORIDE 0.9% FLUSH
10.0000 mL | INTRAVENOUS | Status: DC | PRN
  Administered 2023-12-02: 10 mL via INTRAVENOUS

## 2023-12-02 MED ORDER — OMEPRAZOLE 20 MG PO CPDR: 20.0000 mg | DELAYED_RELEASE_CAPSULE | Freq: Every day | ORAL | 2 refills | Status: DC

## 2023-12-02 MED ORDER — SODIUM CHLORIDE 0.9 % IV SOLN: INTRAVENOUS | Status: DC

## 2023-12-02 NOTE — Patient Instructions (Signed)
 [facility]  Discharge Instructions: Thank you for choosing [facility] to provide your oncology and hematology care.   If you have a lab appointment with the Cancer Center, please go directly to the Cancer Center and check in at the registration area.   Wear comfortable clothing and clothing appropriate for easy access to any Portacath or PICC line.   We strive to give you quality time with your provider. You may need to reschedule your appointment if you arrive late (15 or more minutes).  Arriving late affects you and other patients whose appointments are after yours.  Also, if you miss three or more appointments without notifying the office, you may be dismissed from the clinic at the provider's discretion.      For prescription refill requests, have your pharmacy contact our office and allow 72 hours for refills to be completed.    Today you received the following chemotherapy and/or immunotherapy agents: gemcitabine (GEMZAR       To help prevent nausea and vomiting after your treatment, we encourage you to take your nausea medication as directed.  BELOW ARE SYMPTOMS THAT SHOULD BE REPORTED IMMEDIATELY: *FEVER GREATER THAN 100.4 F (38 C) OR HIGHER *CHILLS OR SWEATING *NAUSEA AND VOMITING THAT IS NOT CONTROLLED WITH YOUR NAUSEA MEDICATION *UNUSUAL SHORTNESS OF BREATH *UNUSUAL BRUISING OR BLEEDING *URINARY PROBLEMS (pain or burning when urinating, or frequent urination) *BOWEL PROBLEMS (unusual diarrhea, constipation, pain near the anus) TENDERNESS IN MOUTH AND THROAT WITH OR WITHOUT PRESENCE OF ULCERS (sore throat, sores in mouth, or a toothache) UNUSUAL RASH, SWELLING OR PAIN  UNUSUAL VAGINAL DISCHARGE OR ITCHING   Items with * indicate a potential emergency and should be followed up as soon as possible or go to the Emergency Department if any problems should occur.  Please show the CHEMOTHERAPY ALERT CARD or  IMMUNOTHERAPY ALERT CARD at check-in to the Emergency Department and triage nurse.  Should you have questions after your visit or need to cancel or reschedule your appointment, please contact [facility]  Dept: [phone]  and follow the prompts.  Office hours are 8:00 a.m. to 4:30 p.m. Monday - Friday. Please note that voicemails left after 4:00 p.m. may not be returned until the following business day.  We are closed weekends and major holidays. You have access to a nurse at all times for urgent questions. Please call the main number to the clinic Dept: [phone] and follow the prompts.   For any non-urgent questions, you may also contact your provider using MyChart. We now offer e-Visits for anyone 77 and older to request care online for non-urgent symptoms. For details visit [email or web address].   Also download the MyChart app! Go to the app store, search "MyChart", open the app, select [place], and log in with your MyChart username and password.

## 2023-12-02 NOTE — Assessment & Plan Note (Signed)
 diagnosed in 09/2021, deemed poor surgical candidate due to his age and medical co-morbidities -S/p single agent gemcitabine 01/13/22 - 04/01/22 and 5 fx SBRT [name]) 05/04/22 - 05/14/22. CA 19-9 decreased after treatment -Surveillance CT 11/09/2022 showed slightly decreased size of pancreatic head mass, no evidence of metastatic disease.  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  He is taking ibuprofen as needed.  No other clinical concern for cancer progression. -Lab reviewed, overall stable, tumor marker still pending. -He was admitted for right femoral neck fracture  on 02/03/23 after a fall and had surgery  -CT scan from May 17, 2023 showed multiple lung nodules, with dominant 1.5 cm nodule in the right middle lobe, highly concerning for metastasis.  His lung lesions are positive on PET scan. -Bronchoscopy and biopsy attempted, which showed atypical cells. -He restarted chemo gemcitabine and abraxane on 07/29/2023. -restaging CT 10/01/2023 showed stable disease  -Due to increased fatigue, chemo changed to single agent gemcitabine on October 21, 2023

## 2023-12-02 NOTE — Progress Notes (Signed)
 Palliative Medicine [facility]  [phone] [phone]   Name: [name] [name] Date: 12/02/2023 MRN: [id number]  DOB: [birth year]  Patient Care Team: Lorenda Ishihara, MD as PCP - General (Internal Medicine) Tessa Lerner, DO as PCP - Cardiology (Cardiology) Tat, Octaviano Batty, DO as Consulting Physician (Neurology) Earna Coder, MD as Consulting Physician (Hematology and Oncology) Fritzi Mandes, MD as Consulting Physician (General Surgery) Malachy Mood, MD as Consulting Physician (Hematology and Oncology) Josephine Igo, DO as Consulting Physician (Pulmonary Disease) Pickenpack-Cousar, Arty Baumgartner, NP as Nurse Practitioner (Hospice and Palliative Medicine)    INTERVAL HISTORY: [name] [name] is a 83 y.o. 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 Mr.Chubbuck' history also includes  pancreatic cancer (09/2021) as well as parkinson's disease. Palliative ask to see for symptom management and goals of care.   SOCIAL HISTORY:     reports that he quit smoking about 13 years ago. His smoking use included cigars. He has never used smokeless tobacco. He reports that he does not currently use alcohol after a past usage of about 4.0 standard drinks of alcohol per week. He reports that he does not use drugs.  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  MEDICATIONS:   Current Outpatient Medications  Medication Sig Dispense Refill   acetaminophen (TYLENOL) 500 MG tablet Take 1 tablet (500 mg total) by mouth every 6 (six) hours as needed for moderate pain or mild pain. (Patient taking differently: Take 500 mg by mouth as needed for moderate pain (pain score 4-6) or mild pain (pain score 1-3).) 30 tablet 0   ammonium lactate (LAC-HYDRIN) 12 % lotion Apply 1 Application topically as needed.     bisacodyl (DULCOLAX) 10 MG suppository Place 1 suppository (10 mg total) rectally daily as needed for moderate constipation. 12 suppository 0   carbidopa-levodopa (SINEMET CR) 50-200 MG tablet TAKE 1 TABLET AT BEDTIME 90 tablet 3   carbidopa-levodopa (SINEMET IR) 25-100 MG tablet Take 3 tablets at 7 AM, 2 tablets at 11 AM, 3 tablets at 3 PM, 2 tablets at 7 PM 900 tablet 0   ciclopirox (PENLAC) 8 % solution Apply 1 Application topically at bedtime.     clonazePAM (KLONOPIN) 0.5 MG tablet Take 1 tablet (0.5 mg total) by mouth at bedtime. 5 tablet 0   cyanocobalamin (VITAMIN B12) 1000 MCG tablet Take 1,000 mcg by mouth daily.     diltiazem (CARDIZEM) 30 MG tablet Take 1 tablet (30 mg total) by mouth 2 (two) times daily. Do not take if systolic blood pressure (top number) is less than 100, or if pulse if less than 55. 180 tablet 2   ELIQUIS 5 MG TABS tablet TAKE 1 TABLET TWICE A DAY 120 tablet 5   Glycerin-Hypromellose-PEG 400 (VISINE DRY EYE OP) Place 1 drop into both eyes  2 (two) times daily as needed (eye irritation).      ketoconazole (NIZORAL) 2 % shampoo Apply 1 application  topically 3 (three) times a week.     leuprolide, 6 Month, (ELIGARD) 45 MG injection Inject 45 mg into the skin every 6 (six) months.     lidocaine-prilocaine (EMLA) cream Apply to port site 1 to 2 hours prior to appointments/treatment 30 g 3   omeprazole (PRILOSEC) 20 MG capsule Take 1 capsule (20 mg total) by mouth daily. 30 capsule 2   ondansetron (ZOFRAN) 8 MG tablet Take 1 tablet (8 mg total) by  mouth every 8 (eight) hours as needed for nausea or vomiting. 30 tablet 1   oxyCODONE (OXY IR/ROXICODONE) 5 MG immediate release tablet Take 1 tablet (5 mg total) by mouth every 4 (four) hours as needed for severe pain (pain score 7-10). 90 tablet 0   Pancrelipase, Lip-Prot-Amyl, (PANCREAZE) 37000-97300 units CPEP TAKE 2 CAPSULES WITH BREAKFAST, LUNCH, AND EVENING MEAL (WITH EACH MEAL) AND 1 CAPSULE WITH SNACK 600 capsule 5   polyethylene glycol powder (MIRALAX) 17 GM/SCOOP powder Take 17 g by mouth in the morning, at noon, and at bedtime.     prochlorperazine (COMPAZINE) 10 MG tablet Take 1 tablet (10 mg total) by mouth every 6 (six) hours as needed for nausea or vomiting. 30 tablet 2   rosuvastatin (CRESTOR) 10 MG tablet TAKE 1 TABLET DAILY 90 tablet 1   tiZANidine (ZANAFLEX) 2 MG tablet Take 1 tablet (2 mg total) by mouth every 8 (eight) hours as needed for muscle spasms. 10 tablet 0   triamcinolone cream (KENALOG) 0.1 % Apply 1 application topically 2 (two) times daily. (Patient taking differently: Apply 1 application  topically 2 (two) times daily as needed (itching).) 453.6 g 1   zoledronic acid (RECLAST) 5 MG/100ML SOLN injection Inject 5 mg into the vein See admin instructions. Once a year     No current facility-administered medications for this visit.   Facility-Administered Medications Ordered in Other Visits  Medication Dose Route Frequency Provider Last Rate Last Admin   0.9 %  sodium chloride infusion   Intravenous Continuous Malachy Mood, MD   Stopped at 12/02/23 1557   sodium chloride flush (NS) 0.9 % injection 10 mL  10 mL Intravenous PRN Malachy Mood, MD   10 mL at 12/02/23 1339    VITAL SIGNS: There were no vitals taken for this visit. There were no vitals filed for this visit.  Estimated body mass index is 22.5 kg/m as calculated from the following:   Height as of 11/01/23: 5\' 6"  (1.676 m).   Weight as of an earlier encounter on 12/02/23: 139 lb 6.4 oz (63.2 kg).   PERFORMANCE  STATUS (ECOG) : 1 - Symptomatic but completely ambulatory   Physical Exam General: NAD Cardiovascular: regular rate and rhythm Pulmonary: normal breathing pattern Extremities: no edema, no joint deformities Skin: no rashes Neurological: AAO x3  Discussed the use of AI scribe software for clinical note transcription with the patient, who gave verbal consent to proceed.  History of Present Illness [name] [name] "Ed" is an 83 year old male who presents with consistent lower back pain. His daughter who is in [facility] on speakerphone. Patient reports he is doing well overall. No acute distress. He experiences constipation as a side effect of oxycodone, which he manages by taking three doses of Miralax daily. This regimen effectively maintains regular bowel movements. No issues with diarrhea. His appetite has been better, and  his weight is currently 139 pounds, a slight decrease from 141 pounds on February 27. He consumes one Premier Protein drink daily to support his nutrition. He takes clonazepam at night for sleep.  Reports occasional nausea and dry heaves, which have become more consistent. He is unsure of the cause but notes that it is not related to any specific medication. He takes Compazine 10 mg every six hours as needed and Zofran 8 mg every eight hours as needed for nausea, which provides relief. He also takes Prilosec daily.  Mr. Chrystal experiences consistent lower back pain described as being 'all over' the lower back. Movement sometimes alleviates the pain, but excessive activity can increase discomfort, necessitating a stop in activity. Reclining back in a chair is more comfortable than sitting up. He rates the worst pain as an eight out of ten, which is reduced to a two with oxycodone. He takes oxycodone approximately every six hours as needed, although it is prescribed for every four hours. He also takes Tylenol to enhance pain relief when oxycodone alone is insufficient.  Pain is well managed on current regimen. No adjustments at this time.   All questions answered and support provided.    I discussed the importance of continued conversation with family and their medical providers regarding overall plan of care and treatment options, ensuring decisions are within the context of the patients values and GOCs.  Assessment and Plan Assessment & Plan Cancer Related  pain Pain reduced with oxycodone and acetaminophen. Constipation managed with polyethylene glycol. - Continue oxycodone every six hours as needed. - Add acetaminophen as needed, one tablet every six hours or two tablets every eight hours. - Manage constipation with polyethylene glycol, three doses daily.  Nausea and vomiting Nausea managed with prochlorperazine, ondansetron, and omeprazole. Avoid breaking pills to prevent nausea. - Continue prochlorperazine 10 mg every six hours as needed. - Continue ondansetron 8 mg every eight hours as needed. - Continue omeprazole daily. - Avoid breaking pills without coating.  Weight management Slight weight decrease with improved appetite. Consumes protein drink daily. - Continue daily protein drink.  I will follow-up with patient in 4-6 weeks.  Sooner if needed.   Patient expressed understanding and was in agreement with this plan. He also understands that He can call the clinic at any time with any questions, concerns, or complaints.   Any controlled substances utilized were prescribed in the context of palliative care. PDMP has been reviewed.   Visit consisted of counseling and education dealing with the complex and emotionally intense issues of symptom management and palliative care in the setting of serious and potentially life-threatening illness.  Willette Alma, AGPCNP-BC  Palliative Medicine Team/[facility]

## 2023-12-02 NOTE — Progress Notes (Signed)
 [facility]   Telephone:([phone]) [phone] Fax:([phone]   Clinic Follow up Note   Patient Care Team: Lorenda Ishihara, MD as PCP - General (Internal Medicine) Tessa Lerner, DO as PCP - Cardiology (Cardiology) Tat, Octaviano Batty, DO as Consulting Physician (Neurology) Earna Coder, MD as Consulting Physician (Hematology and Oncology) Fritzi Mandes, MD as Consulting Physician (General Surgery) Malachy Mood, MD as Consulting Physician (Hematology and Oncology) Josephine Igo, DO as Consulting Physician (Pulmonary Disease) Pickenpack-Cousar, Arty Baumgartner, NP as Nurse Practitioner [facility] and Palliative Medicine)  Date of Service:  12/02/2023  CHIEF COMPLAINT: f/u of pancreatic cancer  CURRENT THERAPY:  Gemcitabine every 2 weeks hey  Oncology History   Pancreatic cancer [facility]) diagnosed in 09/2021, deemed poor surgical candidate due to his age and medical co-morbidities -S/p single agent gemcitabine 01/13/22 - 04/01/22 and 5 fx SBRT [name]) 05/04/22 - 05/14/22. CA 19-9 decreased after treatment -Surveillance CT 11/09/2022 showed slightly decreased size of pancreatic head mass, no evidence of metastatic disease.  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  He is taking ibuprofen as needed.  No other clinical concern for cancer progression. -Lab reviewed, overall stable, tumor marker still pending. -He was admitted for right femoral neck fracture  on 02/03/23 after a fall and had surgery  -CT scan from May 17, 2023 showed multiple lung nodules, with dominant 1.5 cm nodule in the right middle lobe, highly concerning for metastasis.  His lung lesions are positive on PET scan. -Bronchoscopy and biopsy attempted, which showed atypical cells. -He restarted chemo gemcitabine and abraxane on 07/29/2023. -restaging CT 10/01/2023 showed stable disease  -Due to increased fatigue, chemo changed to single agent  gemcitabine on October 21, 2023   Assessment and Plan    Pancreatic cancer Undergoing gemcitabine monotherapy for pancreatic cancer. Tumor marker decreased from 700 to 429, indicating stability. Experiences post-treatment fatigue, recovering by Monday or Tuesday. Reports abdominal discomfort and bloating, improving with Miralax. Weight stable at 139 pounds. Gemcitabine chosen over Abraxane due to better tolerance and reduced fatigue, considering overall health and age. CT scan scheduled for April 18 post-next chemotherapy cycle. Treatment breaks allowed if needed. - Continue gemcitabine monotherapy - Schedule CT scan for April 18 after the next chemotherapy cycle - Monitor tumor markers and adjust treatment if necessary - Allow for treatment breaks if needed  Abdominal discomfort and bloating Reports abdominal discomfort and bloating, particularly in mornings, affecting appetite. Symptoms improve with Miralax, suggesting link to constipation. - Continue Miralax as needed to manage symptoms - Ensure regular bowel movements, at least once or twice a day  Anemia Hemoglobin level at 9.7, slightly below baseline of 10. White blood cell and platelet counts normal, indicating stable blood counts. - Monitor hemoglobin levels and blood counts  Fatigue Experiences fatigue post-chemotherapy, recovering within days. Home assistance aids energy management. Reduction to single chemotherapy drug improves energy levels. 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 Sclerotic 1.5 cm left ischial lesion/ Sclerotic medial left iliac bone 1.5 cm  lesion (series 2/image 47), increased from 1.0 cm. Gleason score of 4+5=  9; with almost all cores involved greater than 80%.  9/16 Lupron 100-month depot on 9/16. [Urology; Dr.Siniski]  # MID OCT 2020- Zytiga 1000 mg+ prednisone; stopped December 2021 [poor tolerance if with RVR]; DISCONTINUED.   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       Prostate cancer metastatic to bone (HCC)  05/24/2019 Initial Diagnosis   Prostate cancer metastatic to bone [facility])   Pancreatic cancer (HCC)  10/02/2021 Imaging   CT ABDOMEN PELVIS W CONTRAST   IMPRESSION: 1. There is new pancreatic ductal dilation with an indeterminate 19 mm hypodense low-density mass area in the head/uncinate process of the pancreas. Recommend further evaluation with dedicated MRI/MRCP with contrast. 2. No evidence of bowel obstruction.  Moderate rectal stool ball. 3. Scattered peripherally located clustered pulmonary nodules in the RIGHT middle lobe. Findings are likely infectious or inflammatory in etiology. Given history of malignancy, recommend follow-up as per clinical protocol.   10/28/2021 Imaging   MR ABDOMEN MRCP W WO CONTAST   IMPRESSION: 1. Examination is significantly limited by breath motion artifact throughout. 2. The main pancreatic duct is diffusely dilated from the level of the superior pancreatic head, measuring up to 0.7 cm. 3. In the inferior pancreatic head and uncinate, there is a multilobulated, fluid signal cystic lesion measuring 1.8 x 1.0 cm. Due to breath motion artifact it is difficult to determine whether this communicates to the adjacent duct. There are multiple additional subcentimeter cystic lesions scattered throughout the pancreas, several of which clearly communicate to the  main pancreatic duct. 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 Recommend EUS/FNA for further diagnosis given the presence of pancreatic ductal dilatation. 4. Status post left nephrectomy. 5. No evidence of recurrent or metastatic disease in the abdomen.   12/25/2021 Procedure   UPPER ENDOSCOPIC ULTRASOUND-By Dr. Meridee Score  - A mass-like region was identified in the pancreatic head where the pancreatic duct dilates with multiple cystic regions noted throughout the pancreas as well. 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 However, the endosonographic appearance is suspicious for potential adenocarcinoma. This was staged T2 N0 Mx by endosonographic criteria. T - No malignant-appearing lymph nodes were visualized in the celiac region (level 20), peripancreatic region and porta hepatis region.   12/25/2021 Pathology Results   CYTOLOGY - NON PAP  CASE: [id number]   FINAL MICROSCOPIC DIAGNOSIS:  A. PANCREAS, HEAD, FINE NEEDLE ASPIRATION:  - Malignant cells consistent with adenocarcinoma     01/01/2022 Initial Diagnosis   Pancreatic cancer (HCC)   01/13/2022 - 04/01/2022 Chemotherapy   Patient is on Treatment Plan : PANCREAS Gemcitabine D1,15 q28d x 4 Cycles     01/26/2022 Cancer Staging   Staging form: Exocrine Pancreas, AJCC 8th Edition - Clinical: Stage IB (cT2, cN0, cM0) - Signed by Malachy Mood, MD on 01/26/2022 Total positive nodes: 0    Genetic Testing   [facility] CancerNext-Expanded Panel was Negative. Of note, a variant of uncertain significance was identified in the BLM gene (p.[id number]) and GALNT12 gene ([id number]). Report date is 01/26/2022.  The CancerNext-Expanded gene panel offered by [facility] and includes sequencing, rearrangement, and RNA analysis for the following 77 genes: AIP, ALK, APC, ATM, AXIN2, BAP1, BARD1, BLM, BMPR1A, BRCA1, BRCA2, BRIP1, CDC73, CDH1, CDK4, CDKN1B, CDKN2A, CHEK2, CTNNA1,  DICER1, FANCC, FH, FLCN, GALNT12, KIF1B, LZTR1, MAX, MEN1, MET, MLH1, MSH2, MSH3, MSH6, MUTYH, NBN, NF1, NF2, NTHL1, PALB2, PHOX2B, PMS2, POT1, PRKAR1A, PTCH1, PTEN, RAD51C, RAD51D, RB1, RECQL, RET, SDHA, SDHAF2, SDHB, SDHC, SDHD, SMAD4, SMARCA4, SMARCB1, SMARCE1, STK11, SUFU, TMEM127, TP53, TSC1, TSC2, VHL and XRCC2 (sequencing and deletion/duplication); EGFR, EGLN1, HOXB13, KIT, MITF, PDGFRA, POLD1, and POLE (sequencing only); EPCAM and GREM1 (deletion/duplication only).    08/12/2022 Imaging    IMPRESSION: 1. Slight interval increase in size of the pancreatic head-uncinate process mass with increasing direct contact of the SMA and portal vein as discussed. Also with a replaced RIGHT hepatic artery which passes through the tumor. 2. No signs of acute inflammation currently about the pancreas. With similar appearance of ductal obstruction and peripheral atrophy of pancreas due to the tumor. 3. Post LEFT nephrectomy. 4. Sclerotic bony lesions of the LEFT ischium and LEFT iliac bone similar to prior imaging. 5. Mild hyperenhancement of LEFT prostate 14 mm with asymmetry, nonspecific. This is unchanged in this patient with history of prostate neoplasm. 6. Aortic atherosclerosis.   07/29/2023 -  Chemotherapy   Patient is on Treatment Plan : PANCREATIC Abraxane D1,8,15 + Gemcitabine D1,8,15 q28d     08/03/2023 Imaging   CT chest abdomen pelvis with contrast IMPRESSION: 1. No substantial change in size of heterogeneously enhancing pancreatic head mass with adjacent fiducials. 2. Slightly increased main pancreatic ductal dilation and mild intrahepatic bile duct dilation. 3. No evidence of new or progressive metastatic disease in the abdomen or pelvis. 4. Similar mildly sclerotic focus in the left ischial tuberosity dating back to 01/07/2019. A left iliac bone faintly sclerotic lesion measuring 1.4 cm has decreased in density since 2020, likely treated osseous metastasis. 5. Left lower lobe  nodules measuring up to 5 mm are unchanged. 6. Aortic Atherosclerosis (ICD10-I70.0).        Discussed the use of AI scribe software for clinical note transcription with the patient, who gave verbal consent to proceed.  History of Present Illness   The patient, an 83 year old male with a history of pancreatic cancer, presents for a follow-up visit after his last chemotherapy treatment. He reports low energy levels but is still able to take care of himself at home with some assistance. He engages in daily activities such as meals, bingo, and entertainment. He also participates in physical therapy twice a week, which includes walking and balance exercises.  Post-treatment, the patient experiences significant fatigue, particularly two days after treatment. However, he feels that he recovers well by the following Monday and Tuesday. His appetite is satisfactory, and his weight has remained stable over the past two weeks.  The patient experiences some discomfort in his stomach, particularly in the mornings, which often makes him feel full and bloated. This discomfort usually subsides during the day, especially after taking Miralax. He denies any shortness of breath or recent falls.         All other systems were reviewed with the patient and are negative.  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 ENDOSCOPY;  Service: Gastroenterology;;   BRONCHIAL BIOPSY  07/05/2023   Procedure: BRONCHIAL BIOPSIES;  Surgeon: Josephine Igo, DO;  Location: [facility] ENDOSCOPY;  Service: Pulmonary;;   COLONOSCOPY  2010, 2015   ESOPHAGOGASTRODUODENOSCOPY N/A 04/16/2022   Procedure: ESOPHAGOGASTRODUODENOSCOPY (EGD);  Surgeon: Lemar Lofty., MD;  Location: [facility] ENDOSCOPY;  Service: Gastroenterology;  Laterality: N/A;   ESOPHAGOGASTRODUODENOSCOPY (EGD) WITH PROPOFOL N/A 12/25/2021   Procedure: ESOPHAGOGASTRODUODENOSCOPY (EGD) WITH PROPOFOL;  Surgeon: Meridee Score Netty Starring., MD;  Location: [facility] ENDOSCOPY;  Service: Gastroenterology;  Laterality: N/A;   EUS N/A 12/25/2021   Procedure: UPPER ENDOSCOPIC ULTRASOUND (EUS) RADIAL;  Surgeon: Lemar Lofty., MD;  Location: [facility] ENDOSCOPY;  Service: Gastroenterology;  Laterality: N/A;   EUS N/A 04/16/2022   Procedure: UPPER ENDOSCOPIC ULTRASOUND (EUS) RADIAL;  Surgeon: Lemar Lofty., MD;  Location: [facility] ENDOSCOPY;  Service: Gastroenterology;  Laterality: N/A;   FIDUCIAL MARKER PLACEMENT N/A 04/16/2022   Procedure: FIDUCIAL MARKER PLACEMENT;  Surgeon: Lemar Lofty., MD;  Location: [facility] ENDOSCOPY;  Service: Gastroenterology;  Laterality: N/A;   FINE NEEDLE ASPIRATION  12/25/2021   Procedure: FINE NEEDLE ASPIRATION (FNA) LINEAR;  Surgeon: Lemar Lofty., MD;  Location: [facility] ENDOSCOPY;  Service: Gastroenterology;;   IR IMAGING GUIDED PORT INSERTION  07/28/2023   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   KYPHOPLASTY N/A 08/15/2020   Procedure: L2 compression fracture;  Surgeon: Kennedy Bucker, MD;  Location: [facility] ORS;  Service: Orthopedics;  Laterality: N/A;   KYPHOPLASTY N/A 08/29/2020   Procedure: T8 KYPHOPLASTY;  Surgeon: Kennedy Bucker, MD;  Location: [facility] ORS;  Service: Orthopedics;  Laterality: N/A;    KYPHOPLASTY N/A 11/12/2020   Procedure: L1 KYPHOPLASTY;  Surgeon: Kennedy Bucker, MD;  Location: [facility] ORS;  Service: Orthopedics;  Laterality: N/A;   PORTACATH PLACEMENT N/A 02/05/2022   Procedure: INSERTION PORT-A-CATH;  Surgeon: Fritzi Mandes, MD;  Location: [facility] ORS;  Service: General;  Laterality: N/A;    I have reviewed the social history and family history with the patient and they are unchanged from previous note.  ALLERGIES:  is allergic to influenza vaccines and influenza virus vaccine.  MEDICATIONS:  Current Outpatient Medications  Medication Sig Dispense Refill   acetaminophen (TYLENOL) 500 MG tablet Take 1 tablet (500 mg total) by mouth every 6 (six) hours as needed for moderate pain or mild pain. (Patient taking differently: Take 500 mg by mouth as needed for moderate pain (pain score 4-6) or mild pain (pain score 1-3).) 30 tablet 0   ammonium lactate (LAC-HYDRIN) 12 % lotion Apply 1 Application topically as needed.     bisacodyl (DULCOLAX) 10 MG suppository Place 1 suppository (10 mg total) rectally daily as needed for moderate constipation. 12 suppository 0   carbidopa-levodopa (SINEMET CR) 50-200 MG tablet TAKE 1 TABLET AT BEDTIME 90 tablet 3   carbidopa-levodopa (SINEMET IR) 25-100 MG tablet Take 3 tablets at 7 AM, 2 tablets at 11 AM, 3 tablets at 3 PM, 2 tablets at 7 PM 900 tablet 0   ciclopirox (PENLAC) 8 % solution Apply 1 Application topically at bedtime.     clonazePAM (KLONOPIN) 0.5 MG tablet Take 1 tablet (0.5  mg total) by mouth at bedtime. 5 tablet 0   cyanocobalamin (VITAMIN B12) 1000 MCG tablet Take 1,000 mcg by mouth daily.     diltiazem (CARDIZEM) 30 MG tablet Take 1 tablet (30 mg total) by mouth 2 (two) times daily. Do not take if systolic blood pressure (top number) is less than 100, or if pulse if less than 55. 180 tablet 2   ELIQUIS 5 MG TABS tablet TAKE 1 TABLET TWICE A DAY 120 tablet 5   Glycerin-Hypromellose-PEG 400 (VISINE DRY EYE OP) Place 1 drop into both  eyes 2 (two) times daily as needed (eye irritation).      ketoconazole (NIZORAL) 2 % shampoo Apply 1 application  topically 3 (three) times a week.     leuprolide, 6 Month, (ELIGARD) 45 MG injection Inject 45 mg into the skin every 6 (six) months.     lidocaine-prilocaine (EMLA) cream Apply to port site 1 to 2 hours prior to appointments/treatment 30 g 3   omeprazole (PRILOSEC) 20 MG capsule Take 1 capsule (20 mg total) by mouth daily. 30 capsule 2   ondansetron (ZOFRAN) 8 MG tablet Take 1 tablet (8 mg total) by mouth every 8 (eight) hours as needed for nausea or vomiting. 30 tablet 1   oxyCODONE (OXY IR/ROXICODONE) 5 MG immediate release tablet Take 1 tablet (5 mg total) by mouth every 4 (four) hours as needed for severe pain (pain score 7-10). 90 tablet 0   Pancrelipase, Lip-Prot-Amyl, (PANCREAZE) 37000-97300 units CPEP TAKE 2 CAPSULES WITH BREAKFAST, LUNCH, AND EVENING MEAL (WITH EACH MEAL) AND 1 CAPSULE WITH SNACK 600 capsule 5   polyethylene glycol powder (MIRALAX) 17 GM/SCOOP powder Take 17 g by mouth in the morning, at noon, and at bedtime.     prochlorperazine (COMPAZINE) 10 MG tablet Take 1 tablet (10 mg total) by mouth every 6 (six) hours as needed for nausea or vomiting. 30 tablet 2   rosuvastatin (CRESTOR) 10 MG tablet TAKE 1 TABLET DAILY 90 tablet 1   tiZANidine (ZANAFLEX) 2 MG tablet Take 1 tablet (2 mg total) by mouth every 8 (eight) hours as needed for muscle spasms. 10 tablet 0   triamcinolone cream (KENALOG) 0.1 % Apply 1 application topically 2 (two) times daily. (Patient taking differently: Apply 1 application  topically 2 (two) times daily as needed (itching).) 453.6 g 1   zoledronic acid (RECLAST) 5 MG/100ML SOLN injection Inject 5 mg into the vein See admin instructions. Once a year     No current facility-administered medications for this visit.   Facility-Administered Medications Ordered in Other Visits  Medication Dose Route Frequency Provider Last Rate Last Admin   0.9 %   sodium chloride infusion   Intravenous Continuous Malachy Mood, MD   Stopped at 12/02/23 1557   sodium chloride flush (NS) 0.9 % injection 10 mL  10 mL Intravenous PRN Malachy Mood, MD   10 mL at 12/02/23 1339    PHYSICAL EXAMINATION: ECOG PERFORMANCE STATUS: 2 - Symptomatic, <50% confined to bed  Vitals:   12/02/23 1421  BP: (!) 111/91  Pulse: (!) 129  Resp: 17  Temp: 98.3 F (36.8 C)  SpO2: 99%   Wt Readings from Last 3 Encounters:  12/02/23 139 lb 6.4 oz (63.2 kg)  11/18/23 138 lb 14.4 oz (63 kg)  11/04/23 141 lb 3.2 oz (64 kg)     GENERAL:alert, no distress and comfortable SKIN: skin color, texture, turgor are normal, no rashes or significant lesions EYES: normal, Conjunctiva are pink and  non-injected, sclera clear NECK: supple, thyroid normal size, non-tender, without nodularity LYMPH:  no palpable lymphadenopathy in the cervical, axillary  LUNGS: clear to auscultation and percussion with normal breathing effort HEART: regular rate & rhythm and no murmurs and no lower extremity edema ABDOMEN:abdomen soft, non-tender and normal bowel sounds Musculoskeletal:no cyanosis of digits and no clubbing  NEURO: alert & oriented x 3 with fluent speech, no focal motor/sensory deficits  Physical Exam   MEASUREMENTS: Weight- 139.      LABORATORY DATA:  I have reviewed the data as listed    Latest Ref Rng & Units 12/02/2023    1:35 PM 11/18/2023    8:44 AM 11/04/2023   12:32 PM  CBC  WBC 4.0 - 10.5 K/uL 5.6  6.1  6.0   Hemoglobin 13.0 - 17.0 g/dL 9.7  78.2  95.6   Hematocrit 39.0 - 52.0 % 30.7  32.6  31.8   Platelets 150 - 400 K/uL 162  140  154         Latest Ref Rng & Units 12/02/2023    1:35 PM 11/18/2023    8:44 AM 11/04/2023   12:32 PM  CMP  Glucose 70 - 99 mg/dL 213  086  99   BUN 8 - 23 mg/dL 22  22  15    Creatinine 0.61 - 1.24 mg/dL 5.78  4.69  6.29   Sodium 135 - 145 mmol/L 140  137  138   Potassium 3.5 - 5.1 mmol/L 4.3  4.3  4.5   Chloride 98 - 111 mmol/L 109  107   107   CO2 22 - 32 mmol/L 28  23  28    Calcium 8.9 - 10.3 mg/dL 9.4  9.1  9.4   Total Protein 6.5 - 8.1 g/dL 6.5  6.5  6.3   Total Bilirubin 0.0 - 1.2 mg/dL 1.0  1.2  0.9   Alkaline Phos 38 - 126 U/L 73  64  67   AST 15 - 41 U/L 34  45  32   ALT 0 - 44 U/L 8  9  5        RADIOGRAPHIC STUDIES: I have personally reviewed the radiological images as listed and agreed with the findings in the report. No results found.    No orders of the defined types were placed in this encounter.  All questions were answered. The patient knows to call the clinic with any problems, questions or concerns. No barriers to learning was detected. The total time spent in the appointment was 25 minutes.     Malachy Mood, MD 12/02/2023

## 2023-12-03 ENCOUNTER — Encounter: Payer: Self-pay | Admitting: Internal Medicine

## 2023-12-03 ENCOUNTER — Encounter: Payer: Self-pay | Admitting: Hematology

## 2023-12-03 LAB — CANCER ANTIGEN 19-9: CA 19-9: 381 U/mL — ABNORMAL HIGH (ref 0–35)

## 2023-12-06 DIAGNOSIS — R2689 Other abnormalities of gait and mobility: Secondary | ICD-10-CM | POA: Diagnosis not present

## 2023-12-06 DIAGNOSIS — M6281 Muscle weakness (generalized): Secondary | ICD-10-CM | POA: Diagnosis not present

## 2023-12-07 ENCOUNTER — Other Ambulatory Visit: Payer: Self-pay

## 2023-12-07 ENCOUNTER — Emergency Department (HOSPITAL_BASED_OUTPATIENT_CLINIC_OR_DEPARTMENT_OTHER)

## 2023-12-07 ENCOUNTER — Inpatient Hospital Stay (HOSPITAL_BASED_OUTPATIENT_CLINIC_OR_DEPARTMENT_OTHER)
Admission: EM | Admit: 2023-12-07 | Discharge: 2023-12-09 | DRG: 314 | Disposition: A | Discharge status: Home Health | Attending: Internal Medicine | Admitting: Internal Medicine

## 2023-12-07 ENCOUNTER — Encounter (HOSPITAL_BASED_OUTPATIENT_CLINIC_OR_DEPARTMENT_OTHER): Payer: Self-pay

## 2023-12-07 DIAGNOSIS — I5043 Acute on chronic combined systolic (congestive) and diastolic (congestive) heart failure: Principal | ICD-10-CM | POA: Diagnosis present

## 2023-12-07 DIAGNOSIS — G901 Familial dysautonomia [Riley-Day]: Secondary | ICD-10-CM | POA: Diagnosis present

## 2023-12-07 DIAGNOSIS — K7689 Other specified diseases of liver: Secondary | ICD-10-CM | POA: Diagnosis not present

## 2023-12-07 DIAGNOSIS — R918 Other nonspecific abnormal finding of lung field: Secondary | ICD-10-CM | POA: Diagnosis not present

## 2023-12-07 DIAGNOSIS — Z8249 Family history of ischemic heart disease and other diseases of the circulatory system: Secondary | ICD-10-CM | POA: Diagnosis not present

## 2023-12-07 DIAGNOSIS — Z8546 Personal history of malignant neoplasm of prostate: Secondary | ICD-10-CM

## 2023-12-07 DIAGNOSIS — F419 Anxiety disorder, unspecified: Secondary | ICD-10-CM | POA: Diagnosis present

## 2023-12-07 DIAGNOSIS — I4430 Unspecified atrioventricular block: Secondary | ICD-10-CM | POA: Diagnosis present

## 2023-12-07 DIAGNOSIS — G20A1 Parkinson's disease without dyskinesia, without mention of fluctuations: Secondary | ICD-10-CM | POA: Diagnosis not present

## 2023-12-07 DIAGNOSIS — Z803 Family history of malignant neoplasm of breast: Secondary | ICD-10-CM

## 2023-12-07 DIAGNOSIS — Z887 Allergy status to serum and vaccine status: Secondary | ICD-10-CM | POA: Diagnosis not present

## 2023-12-07 DIAGNOSIS — C259 Malignant neoplasm of pancreas, unspecified: Secondary | ICD-10-CM | POA: Diagnosis not present

## 2023-12-07 DIAGNOSIS — I4819 Other persistent atrial fibrillation: Secondary | ICD-10-CM | POA: Diagnosis present

## 2023-12-07 DIAGNOSIS — I428 Other cardiomyopathies: Secondary | ICD-10-CM | POA: Diagnosis not present

## 2023-12-07 DIAGNOSIS — Z79899 Other long term (current) drug therapy: Secondary | ICD-10-CM | POA: Diagnosis not present

## 2023-12-07 DIAGNOSIS — I4892 Unspecified atrial flutter: Secondary | ICD-10-CM | POA: Diagnosis not present

## 2023-12-07 DIAGNOSIS — Z833 Family history of diabetes mellitus: Secondary | ICD-10-CM

## 2023-12-07 DIAGNOSIS — J90 Pleural effusion, not elsewhere classified: Secondary | ICD-10-CM | POA: Diagnosis not present

## 2023-12-07 DIAGNOSIS — R55 Syncope and collapse: Secondary | ICD-10-CM

## 2023-12-07 DIAGNOSIS — C7951 Secondary malignant neoplasm of bone: Secondary | ICD-10-CM | POA: Diagnosis not present

## 2023-12-07 DIAGNOSIS — D638 Anemia in other chronic diseases classified elsewhere: Secondary | ICD-10-CM | POA: Diagnosis not present

## 2023-12-07 DIAGNOSIS — I959 Hypotension, unspecified: Principal | ICD-10-CM | POA: Diagnosis present

## 2023-12-07 DIAGNOSIS — G893 Neoplasm related pain (acute) (chronic): Secondary | ICD-10-CM | POA: Diagnosis present

## 2023-12-07 DIAGNOSIS — I5021 Acute systolic (congestive) heart failure: Secondary | ICD-10-CM | POA: Diagnosis not present

## 2023-12-07 DIAGNOSIS — I501 Left ventricular failure: Secondary | ICD-10-CM | POA: Diagnosis present

## 2023-12-07 DIAGNOSIS — G4733 Obstructive sleep apnea (adult) (pediatric): Secondary | ICD-10-CM | POA: Diagnosis not present

## 2023-12-07 DIAGNOSIS — I5033 Acute on chronic diastolic (congestive) heart failure: Secondary | ICD-10-CM | POA: Diagnosis not present

## 2023-12-07 DIAGNOSIS — E785 Hyperlipidemia, unspecified: Secondary | ICD-10-CM | POA: Diagnosis present

## 2023-12-07 DIAGNOSIS — Z7901 Long term (current) use of anticoagulants: Secondary | ICD-10-CM | POA: Diagnosis not present

## 2023-12-07 DIAGNOSIS — N400 Enlarged prostate without lower urinary tract symptoms: Secondary | ICD-10-CM | POA: Diagnosis not present

## 2023-12-07 DIAGNOSIS — I4510 Unspecified right bundle-branch block: Secondary | ICD-10-CM | POA: Diagnosis not present

## 2023-12-07 DIAGNOSIS — Z96641 Presence of right artificial hip joint: Secondary | ICD-10-CM | POA: Diagnosis present

## 2023-12-07 DIAGNOSIS — G4730 Sleep apnea, unspecified: Secondary | ICD-10-CM | POA: Diagnosis present

## 2023-12-07 DIAGNOSIS — C61 Malignant neoplasm of prostate: Secondary | ICD-10-CM | POA: Diagnosis present

## 2023-12-07 DIAGNOSIS — Z87891 Personal history of nicotine dependence: Secondary | ICD-10-CM

## 2023-12-07 LAB — TROPONIN I (HIGH SENSITIVITY)
Troponin I (High Sensitivity): 18 ng/L — ABNORMAL HIGH (ref <18)
Troponin I (High Sensitivity): 17 ng/L (ref <18)

## 2023-12-07 LAB — CBC WITH DIFFERENTIAL/PLATELET
Abs Immature Granulocytes: 0.01 10*3/uL (ref 0.00–0.07)
Basophils Absolute: 0 10*3/uL (ref 0.0–0.1)
Basophils Relative: 1 %
Eosinophils Absolute: 0.1 10*3/uL (ref 0.0–0.5)
Eosinophils Relative: 2 %
HCT: 26.4 % — ABNORMAL LOW (ref 39.0–52.0)
Hemoglobin: 8.3 g/dL — ABNORMAL LOW (ref 13.0–17.0)
Immature Granulocytes: 0 %
Lymphocytes Relative: 14 %
Lymphs Abs: 0.6 10*3/uL — ABNORMAL LOW (ref 0.7–4.0)
MCH: 31 pg (ref 26.0–34.0)
MCHC: 31.4 g/dL (ref 30.0–36.0)
MCV: 98.5 fL (ref 80.0–100.0)
Monocytes Absolute: 0.1 10*3/uL (ref 0.1–1.0)
Monocytes Relative: 3 %
Neutro Abs: 3.6 10*3/uL (ref 1.7–7.7)
Neutrophils Relative %: 80 %
Platelets: 246 10*3/uL (ref 150–400)
RBC: 2.68 MIL/uL — ABNORMAL LOW (ref 4.22–5.81)
RDW: 18.5 % — ABNORMAL HIGH (ref 11.5–15.5)
WBC: 4.5 10*3/uL (ref 4.0–10.5)
nRBC: 0 % (ref 0.0–0.2)

## 2023-12-07 LAB — COMPREHENSIVE METABOLIC PANEL WITH GFR
ALT: 6 U/L (ref 0–44)
AST: 38 U/L (ref 15–41)
Albumin: 3.3 g/dL — ABNORMAL LOW (ref 3.5–5.0)
Alkaline Phosphatase: 72 U/L (ref 38–126)
Anion gap: 6 (ref 5–15)
BUN: 25 mg/dL — ABNORMAL HIGH (ref 8–23)
CO2: 24 mmol/L (ref 22–32)
Calcium: 8.7 mg/dL — ABNORMAL LOW (ref 8.9–10.3)
Chloride: 106 mmol/L (ref 98–111)
Creatinine, Ser: 0.84 mg/dL (ref 0.61–1.24)
GFR, Estimated: >60 mL/min (ref >60)
Glucose, Bld: 167 mg/dL — ABNORMAL HIGH (ref 70–99)
Potassium: 4.1 mmol/L (ref 3.5–5.1)
Sodium: 136 mmol/L (ref 135–145)
Total Bilirubin: 0.9 mg/dL (ref 0.0–1.2)
Total Protein: 5.9 g/dL — ABNORMAL LOW (ref 6.5–8.1)

## 2023-12-07 LAB — BRAIN NATRIURETIC PEPTIDE: B Natriuretic Peptide: 778.8 pg/mL — ABNORMAL HIGH (ref 0.0–100.0)

## 2023-12-07 LAB — OCCULT BLOOD X 1 CARD TO LAB, STOOL: Fecal Occult Bld: NEGATIVE

## 2023-12-07 LAB — LACTIC ACID, PLASMA: Lactic Acid, Venous: 0.8 mmol/L (ref 0.5–1.9)

## 2023-12-07 LAB — CBG MONITORING, ED: Glucose-Capillary: 147 mg/dL — ABNORMAL HIGH (ref 70–99)

## 2023-12-07 MED ORDER — DILTIAZEM HCL 30 MG PO TABS
30.0000 mg | ORAL_TABLET | Freq: Two times a day (BID) | ORAL | Status: DC
  Administered 2023-12-08: 30 mg via ORAL
  Filled 2023-12-07: qty 1

## 2023-12-07 MED ORDER — ACETAMINOPHEN 650 MG RE SUPP: 650.0000 mg | Freq: Four times a day (QID) | RECTAL | Status: DC | PRN

## 2023-12-07 MED ORDER — SODIUM CHLORIDE 0.9 % IV BOLUS
1000.0000 mL | Freq: Once | INTRAVENOUS | Status: AC
  Administered 2023-12-07: 1000 mL via INTRAVENOUS

## 2023-12-07 MED ORDER — IOHEXOL 350 MG/ML SOLN
100.0000 mL | Freq: Once | INTRAVENOUS | Status: AC | PRN
  Administered 2023-12-07: 85 mL via INTRAVENOUS

## 2023-12-07 MED ORDER — PANTOPRAZOLE SODIUM 40 MG PO TBEC
40.0000 mg | DELAYED_RELEASE_TABLET | Freq: Every day | ORAL | Status: DC
  Administered 2023-12-08 – 2023-12-09 (×2): 40 mg via ORAL
  Filled 2023-12-07 (×2): qty 1

## 2023-12-07 MED ORDER — SENNOSIDES-DOCUSATE SODIUM 8.6-50 MG PO TABS: 1.0000 | ORAL_TABLET | Freq: Every evening | ORAL | Status: DC | PRN

## 2023-12-07 MED ORDER — ONDANSETRON HCL 4 MG PO TABS: 4.0000 mg | ORAL_TABLET | Freq: Four times a day (QID) | ORAL | Status: DC | PRN

## 2023-12-07 MED ORDER — ENSURE ENLIVE PO LIQD
237.0000 mL | Freq: Two times a day (BID) | ORAL | Status: DC
  Administered 2023-12-08 – 2023-12-09 (×2): 237 mL via ORAL

## 2023-12-07 MED ORDER — SODIUM CHLORIDE 0.9% FLUSH
10.0000 mL | Freq: Two times a day (BID) | INTRAVENOUS | Status: DC
Start: 2023-12-07 — End: 2023-12-09
  Administered 2023-12-07 – 2023-12-08 (×2): 10 mL

## 2023-12-07 MED ORDER — SODIUM CHLORIDE 0.9% FLUSH: 10.0000 mL | INTRAVENOUS | Status: DC | PRN

## 2023-12-07 MED ORDER — CLONAZEPAM 0.5 MG PO TABS
0.5000 mg | ORAL_TABLET | Freq: Every day | ORAL | Status: DC
  Administered 2023-12-07 – 2023-12-08 (×2): 0.5 mg via ORAL
  Filled 2023-12-07 (×2): qty 1

## 2023-12-07 MED ORDER — SODIUM CHLORIDE 0.9% FLUSH
3.0000 mL | Freq: Two times a day (BID) | INTRAVENOUS | Status: DC
  Administered 2023-12-07 – 2023-12-08 (×2): 3 mL via INTRAVENOUS

## 2023-12-07 MED ORDER — ROSUVASTATIN CALCIUM 10 MG PO TABS
10.0000 mg | ORAL_TABLET | Freq: Every day | ORAL | Status: DC
  Administered 2023-12-08 – 2023-12-09 (×2): 10 mg via ORAL
  Filled 2023-12-07 (×2): qty 1

## 2023-12-07 MED ORDER — CHLORHEXIDINE GLUCONATE CLOTH 2 % EX PADS
6.0000 | MEDICATED_PAD | Freq: Every day | CUTANEOUS | Status: DC
  Administered 2023-12-07 – 2023-12-09 (×3): 6 via TOPICAL

## 2023-12-07 MED ORDER — ACETAMINOPHEN 325 MG PO TABS
650.0000 mg | ORAL_TABLET | Freq: Four times a day (QID) | ORAL | Status: DC | PRN
  Administered 2023-12-07 – 2023-12-09 (×3): 650 mg via ORAL
  Filled 2023-12-07 (×3): qty 2

## 2023-12-07 MED ORDER — FUROSEMIDE 10 MG/ML IJ SOLN
20.0000 mg | Freq: Once | INTRAMUSCULAR | Status: AC
  Administered 2023-12-07: 20 mg via INTRAVENOUS
  Filled 2023-12-07: qty 2

## 2023-12-07 MED ORDER — APIXABAN 5 MG PO TABS
5.0000 mg | ORAL_TABLET | Freq: Two times a day (BID) | ORAL | Status: DC
Start: 2023-12-07 — End: 2023-12-09
  Administered 2023-12-07 – 2023-12-09 (×4): 5 mg via ORAL
  Filled 2023-12-07 (×4): qty 1

## 2023-12-07 MED ORDER — FUROSEMIDE 10 MG/ML IJ SOLN
20.0000 mg | Freq: Two times a day (BID) | INTRAMUSCULAR | Status: DC
Start: 2023-12-08 — End: 2023-12-09
  Administered 2023-12-08 – 2023-12-09 (×3): 20 mg via INTRAVENOUS
  Filled 2023-12-07 (×3): qty 2

## 2023-12-07 MED ORDER — CARBIDOPA-LEVODOPA 25-100 MG PO TABS
3.0000 | ORAL_TABLET | Freq: Two times a day (BID) | ORAL | Status: DC
  Administered 2023-12-08 – 2023-12-09 (×4): 3 via ORAL
  Filled 2023-12-07 (×4): qty 3

## 2023-12-07 MED ORDER — CARBIDOPA-LEVODOPA 25-100 MG PO TABS
2.0000 | ORAL_TABLET | Freq: Two times a day (BID) | ORAL | Status: DC
Start: 2023-12-07 — End: 2023-12-09
  Administered 2023-12-07 – 2023-12-09 (×4): 2 via ORAL
  Filled 2023-12-07 (×4): qty 2

## 2023-12-07 MED ORDER — OXYCODONE HCL 5 MG PO TABS: 5.0000 mg | ORAL_TABLET | ORAL | Status: DC | PRN

## 2023-12-07 MED ORDER — ONDANSETRON HCL 4 MG/2ML IJ SOLN: 4.0000 mg | Freq: Four times a day (QID) | INTRAMUSCULAR | Status: DC | PRN

## 2023-12-07 MED ORDER — MIDODRINE HCL 5 MG PO TABS
5.0000 mg | ORAL_TABLET | Freq: Three times a day (TID) | ORAL | Status: DC
  Administered 2023-12-07 – 2023-12-09 (×6): 5 mg via ORAL
  Filled 2023-12-07 (×6): qty 1

## 2023-12-07 MED ORDER — CARBIDOPA-LEVODOPA ER 50-200 MG PO TBCR
1.0000 | EXTENDED_RELEASE_TABLET | Freq: Every day | ORAL | Status: DC
  Administered 2023-12-07 – 2023-12-08 (×2): 1 via ORAL
  Filled 2023-12-07 (×3): qty 1

## 2023-12-07 NOTE — ED Triage Notes (Addendum)
 In for eval of hypotension at assisted living, reports systolic in the 60s. Fatigue, dizziness x1 with standing up. Denies nausea, vomiting, diarrhea, chest pain, or SOB.

## 2023-12-07 NOTE — ED Provider Notes (Signed)
  Physical Exam  BP 105/87   Pulse 92   Temp 97.7 F (36.5 C) (Oral)   Resp 15   Ht 5\' 4"  (1.626 m)   Wt 63.5 kg   SpO2 95%   BMI 24.03 kg/m   Physical Exam  Procedures  Procedures  ED Course / MDM   Clinical Course as of 12/07/23 1645  Tue Dec 07, 2023  1459 Assumed care from Dr. Karene Fry.  83 year old male with a history of AF, Parkinson's disease, metastatic prostate cancer to bone, and pancreatic cancer on chemo and followed by palliative care who presented with lightheadedness and fatigue. SBP in 60s at his facility. Hgb has dropped 2 points. Hemoccult negative. Trop and lactic elevated. In AF. Appears dry. Awaiting CT scans at this time.  Was seen in February for similar symptoms. [RP]  1555 EKG with right bundle branch block and atrial flutter with controlled rate.  No ischemic changes.  Initial troponin marginally elevated at 18.  Awaiting repeat at this time.  CTA with bilateral pleural effusions and interstitial edema.  Also has pulmonary nodules that are stable. [RP]  1556 B Natriuretic Peptide(!): 778.8 [RP]  1615 Initial troponin 18 and is now 17 [RP]  1642 With the patient's bilateral pleural effusions and pulmonary edema feel that he would benefit from diuresis.  With his soft blood pressures I am concerned about discharging him at this point of time.  Last echo was in 2022 and feel that he may also benefit from a repeat echo since his BNP is elevated.  Discussed with Dr. Idelle Leech from [facility] hospitalist who will admit him to telemetry [RP]    Clinical Course User Index [RP] Rondel Baton, MD   Medical Decision Making Amount and/or Complexity of Data Reviewed Labs: ordered. Decision-making details documented in ED Course. Radiology: ordered.  Risk Prescription drug management. Decision regarding hospitalization.      Rondel Baton, MD 12/07/23 [phone]

## 2023-12-07 NOTE — Hospital Course (Signed)
 [name] [name] is a 83 y.o. male with medical history significant for chronic atrial fibrillation/flutter on Eliquis, pancreatic cancer on gemcitabine monotherapy, prostate cancer metastatic to bone, Parkinson's disease, anemia of chronic disease, HLD, OSA on BiPAP who is admitted with acute on chronic HFpEF with pulmonary edema and hypotension PTA.

## 2023-12-07 NOTE — Care Plan (Signed)
 This 83 years old Male with PMH significant for Parkinson disease, atrial flutter on anticoagulation, prostate cancer metastasized to the bone in remission, pancreatic cancer on chemotherapy presented in the ED from local nursing home with fatigue, lightheadedness and hypotension.  He was found to have systolic blood pressure 60 at nursing home.  Patient was brought in the ED at [facility].  He was given gentle fluids his blood pressure has improved.  Patient found to have flash pulmonary edema on chest x-ray.  Patient is accepted to [facility] telemetry bed for CHF exacerbation.  Patient needs IV diuresis, 2D echocardiogram and likely cardiology consult.

## 2023-12-07 NOTE — ED Provider Notes (Signed)
 [facility] EMERGENCY DEPARTMENT AT [facility] Provider Note   CSN: [id number] Arrival date & time: 12/07/23  1053     History  Chief Complaint  Patient presents with   Hypotension    [name] [name] is a 83 y.o. male.  HPI   82 year old male with medical history significant for Parkinson's disease, atrial flutter on anticoagulation, prostate cancer metastatic to bone in remission, pancreatic cancer on chemotherapy presenting to the emergency department with fatigue, lightheadedness on standing and hypotension at his assisted living facility.  Systolics were reportedly in the 60s when the patient was symptomatic.  He denies any chest pain, shortness of breath, fever or chills.  No nausea, vomiting or diarrhea.  He endorses abdominal bloating and he does not regularly pass gas. No dark stools.  Home Medications Prior to Admission medications   Medication Sig Start Date End Date Taking? Authorizing Provider  carbidopa-levodopa (SINEMET CR) 50-200 MG tablet TAKE 1 TABLET AT BEDTIME 10/18/23  Yes Tat, Rebecca S, DO  carbidopa-levodopa (SINEMET IR) 25-100 MG tablet Take 3 tablets at 7 AM, 2 tablets at 11 AM, 3 tablets at 3 PM, 2 tablets at 7 PM 10/26/23  Yes Tat, Octaviano Batty, DO  clonazePAM (KLONOPIN) 0.5 MG tablet Take 1 tablet (0.5 mg total) by mouth at bedtime. 02/09/23  Yes Sheikh, Omair Latif, DO  cyanocobalamin (VITAMIN B12) 1000 MCG tablet Take 1,000 mcg by mouth daily.   Yes [provider]  diltiazem (CARDIZEM) 30 MG tablet Take 1 tablet (30 mg total) by mouth 2 (two) times daily. Do not take if systolic blood pressure (top number) is less than 100, or if pulse if less than 55. 11/25/23  Yes Tolia, Sunit, DO  ELIQUIS 5 MG TABS tablet TAKE 1 TABLET TWICE A DAY 03/12/23  Yes Tolia, Sunit, DO  omeprazole (PRILOSEC) 20 MG capsule Take 1 capsule (20 mg total) by mouth daily. 12/02/23  Yes Malachy Mood, MD  ondansetron (ZOFRAN) 8 MG tablet Take 1 tablet (8 mg total) by mouth every 8  (eight) hours as needed for nausea or vomiting. 11/04/23  Yes Malachy Mood, MD  oxyCODONE (OXY IR/ROXICODONE) 5 MG immediate release tablet Take 1 tablet (5 mg total) by mouth every 4 (four) hours as needed for severe pain (pain score 7-10). 11/17/23  Yes Pickenpack-Cousar, Arty Baumgartner, NP  Pancrelipase, Lip-Prot-Amyl, (PANCREAZE) 37000-97300 units CPEP TAKE 2 CAPSULES WITH BREAKFAST, LUNCH, AND EVENING MEAL (WITH EACH MEAL) AND 1 CAPSULE WITH SNACK 11/13/23  Yes Arnaldo Natal, NP  polyethylene glycol powder (MIRALAX) 17 GM/SCOOP powder Take 17 g by mouth in the morning, at noon, and at bedtime.   Yes [provider]  prochlorperazine (COMPAZINE) 10 MG tablet Take 1 tablet (10 mg total) by mouth every 6 (six) hours as needed for nausea or vomiting. 09/23/23  Yes Pollyann Samples, NP  rosuvastatin (CRESTOR) 10 MG tablet TAKE 1 TABLET DAILY 07/08/23  Yes Tolia, Sunit, DO  tiZANidine (ZANAFLEX) 2 MG tablet Take 1 tablet (2 mg total) by mouth every 8 (eight) hours as needed for muscle spasms. 02/09/23  Yes Sheikh, Omair Latif, DO  acetaminophen (TYLENOL) 500 MG tablet Take 1 tablet (500 mg total) by mouth every 6 (six) hours as needed for moderate pain or mild pain. Patient taking differently: Take 500 mg by mouth as needed for moderate pain (pain score 4-6) or mild pain (pain score 1-3). 04/18/22   Mansouraty, Netty Starring., MD  ammonium lactate (LAC-HYDRIN) 12 % lotion Apply 1 Application  topically as needed. 06/22/23   [provider]  bisacodyl (DULCOLAX) 10 MG suppository Place 1 suppository (10 mg total) rectally daily as needed for moderate constipation. 02/09/23   Marguerita Merles Latif, DO  ciclopirox (PENLAC) 8 % solution Apply 1 Application topically at bedtime.  09/29/24  [provider]  Glycerin-Hypromellose-PEG 400 (VISINE DRY EYE OP) Place 1 drop into both eyes 2 (two) times daily as needed (eye irritation).     [provider]  ketoconazole (NIZORAL) 2 % shampoo  Apply 1 application  topically 3 (three) times a week. 02/15/19   [provider]  leuprolide, 6 Month, (ELIGARD) 45 MG injection Inject 45 mg into the skin every 6 (six) months.    [provider]  lidocaine-prilocaine (EMLA) cream Apply to port site 1 to 2 hours prior to appointments/treatment 08/13/23   Carlean Jews, NP  triamcinolone cream (KENALOG) 0.1 % Apply 1 application topically 2 (two) times daily. Patient taking differently: Apply 1 application  topically 2 (two) times daily as needed (itching). 01/16/21   Duanne Limerick, MD  zoledronic acid (RECLAST) 5 MG/100ML SOLN injection Inject 5 mg into the vein See admin instructions. Once a year    [provider]      Allergies    Influenza vaccines and Influenza virus vaccine    Review of Systems   Review of Systems  Constitutional:  Positive for fatigue.  Neurological:  Positive for light-headedness.  All other systems reviewed and are negative.   Physical Exam Updated Vital Signs BP 105/87   Pulse 92   Temp 97.7 F (36.5 C) (Oral)   Resp 15   Ht 5\' 4"  (1.626 m)   Wt 63.5 kg   SpO2 95%   BMI 24.03 kg/m  Physical Exam Vitals and nursing note reviewed.  Constitutional:      General: He is not in acute distress.    Appearance: He is well-developed.  HENT:     Head: Normocephalic and atraumatic.     Mouth/Throat:     Mouth: Mucous membranes are dry.  Eyes:     Conjunctiva/sclera: Conjunctivae normal.  Cardiovascular:     Rate and Rhythm: Tachycardia present. Rhythm irregular.     Heart sounds: No murmur heard. Pulmonary:     Effort: Pulmonary effort is normal. No respiratory distress.     Breath sounds: Normal breath sounds.  Abdominal:     General: There is distension.     Palpations: Abdomen is soft.     Tenderness: There is no abdominal tenderness. There is no guarding.  Musculoskeletal:        General: No swelling.     Cervical back: Neck supple.  Skin:    General: Skin is  warm and dry.     Capillary Refill: Capillary refill takes less than 2 seconds.     Coloration: Skin is pale.  Neurological:     General: No focal deficit present.     Mental Status: He is alert. Mental status is at baseline.     Cranial Nerves: No cranial nerve deficit.     Sensory: No sensory deficit.     Motor: No weakness.  Psychiatric:        Mood and Affect: Mood normal.     ED Results / Procedures / Treatments   Labs (all labs ordered are listed, but only abnormal results are displayed) Labs Reviewed  CBC WITH DIFFERENTIAL/PLATELET - Abnormal; Notable for the following components:  Result Value   RBC 2.68 (*)    Hemoglobin 8.3 (*)    HCT 26.4 (*)    RDW 18.5 (*)    Lymphs Abs 0.6 (*)    All other components within normal limits  COMPREHENSIVE METABOLIC PANEL WITH GFR - Abnormal; Notable for the following components:   Glucose, Bld 167 (*)    BUN 25 (*)    Calcium 8.7 (*)    Total Protein 5.9 (*)    Albumin 3.3 (*)    All other components within normal limits  BRAIN NATRIURETIC PEPTIDE - Abnormal; Notable for the following components:   B Natriuretic Peptide 778.8 (*)    All other components within normal limits  CBG MONITORING, ED - Abnormal; Notable for the following components:   Glucose-Capillary 147 (*)    All other components within normal limits  TROPONIN I (HIGH SENSITIVITY) - Abnormal; Notable for the following components:   Troponin I (High Sensitivity) 18 (*)    All other components within normal limits  LACTIC ACID, PLASMA  OCCULT BLOOD X 1 CARD TO LAB, STOOL  TROPONIN I (HIGH SENSITIVITY)    EKG EKG Interpretation Date/Time:  Tuesday December 07 2023 11:14:31 EDT Ventricular Rate:  106 PR Interval:    QRS Duration:  131 QT Interval:  383 QTC Calculation: 509 R Axis:   -38  Text Interpretation: Atrial flutter Right bundle branch block Reconfirmed by Ernie Avena ([id number]) on 12/07/2023 2:18:43 PM  Radiology No results  found.  Procedures Procedures    Medications Ordered in ED Medications  sodium chloride 0.9 % bolus 1,000 mL (0 mLs Intravenous Stopped 12/07/23 1349)  iohexol (OMNIPAQUE) 350 MG/ML injection 100 mL (85 mLs Intravenous Contrast Given 12/07/23 1326)    ED Course/ Medical Decision Making/ A&P Clinical Course as of 12/07/23 1524  Tue Dec 07, 2023  1459 Metastatic prostate cancer to bone and pancreatic cancer on chemo. Pw lightheadedness and fatigue. SBP in 60s at his facility. Hgb has dropped 2 points. Hemoccult negative. Trop and lactic elevated. In AF. Low threshold to admit. Appears dry. Awaiting CT scans at this time.  [RP]    Clinical Course User Index [RP] Rondel Baton, MD                                 Medical Decision Making Amount and/or Complexity of Data Reviewed Labs: ordered. Radiology: ordered.  Risk Prescription drug management.     83 year old male with medical history significant for Parkinson's disease, atrial flutter on anticoagulation, prostate cancer metastatic to bone in remission, pancreatic cancer on chemotherapy presenting to the emergency department with fatigue, lightheadedness on standing and hypotension at his assisted living facility.  Systolics were reportedly in the 60s when the patient was symptomatic.  He denies any chest pain, shortness of breath, fever or chills.  No nausea, vomiting or diarrhea.  He endorses abdominal bloating and he does not regularly pass gas. No dark stools.  On arrival, the patient was afebrile, tachycardic heart rate 112, respiratory rate 18, BP 100/76, saturating 97% on room air.  Atrial flutter was noted on EKG.  Intermittently atrial fibrillation was also noted on cardiac telemetry.  Patient not in RVR.  Appears dehydrated, dry mucous membranes were noted.  Abdominal distention and the patient was notably pale.  Differential diagnosis includes hypovolemia from poor p.o. intake, PE, pneumonia, electrolyte abnormality,  intra-abdominal emergency such as small bowel obstruction, infectious etiology.  Low concern for aortic dissection, ACS.  Patient not in RVR.  IV access was obtained, patient was administered IV fluid bolus for volume resuscitation.  Considered symptomatic anemia.  Laboratory evaluation revealed initial CBG 147, initial cardiac troponin 18, BNP nonspecifically moderately elevated at 779, CMP without significant electrolyte abnormality, CBG 167, BUN 25, creatinine 0.84, total protein and albumin mildly low at 5.9 and 3.3, calcium corrects to normal after correcting for albumin, LFTs normal, lactic acid normal, CBC without a leukocytosis, hemoglobin 8.3, decreased from baseline of around 10.  Fecal occult testing was performed and resulted negative.  CTA PE and CT Abdomen Pelvis: pending at time of signout. Plan to reassess the patient following CT imaging, ultimate disposition pending results of diagnostic testing and reassessment however low threshold for admission.  Signout given to Dr. Jarold Motto at 1500.   Final Clinical Impression(s) / ED Diagnoses Final diagnoses:  Hypotension, unspecified hypotension type  Near syncope    Rx / DC Orders ED Discharge Orders     None         Ernie Avena, MD 12/07/23 1524

## 2023-12-07 NOTE — ED Notes (Signed)
 Kim with [facility] called for transport

## 2023-12-07 NOTE — Progress Notes (Signed)
   12/07/23 2341  BiPAP/CPAP/SIPAP  $ Non-Invasive Home Ventilator  Initial  $ Face Mask Large  Yes  BiPAP/CPAP/SIPAP Pt Type Adult  BiPAP/CPAP/SIPAP Resmed  Mask Type Full face mask (per home regimen)  Dentures removed? Not applicable  Mask Size Large  Respiratory Rate 20 breaths/min  FiO2 (%) 21 %  Patient Home Machine No  Patient Home Mask No  Patient Home Tubing No  Auto Titrate Yes (automode, Emin5cm, Imax:20cm, PS5)  Minimum cmH2O 5 cmH2O  Maximum cmH2O 20 cmH2O  CPAP/SIPAP surface wiped down Yes  BiPAP/CPAP /SiPAP Vitals  Pulse Rate 76  Resp 20  SpO2 96 %

## 2023-12-07 NOTE — H&P (Signed)
 History and Physical    TEX CONROY ZOX:[id number] DOB: 10-28-1940 DOA: 12/07/2023  PCP: Lorenda Ishihara, MD  Patient coming from: Assisted living  I have personally briefly reviewed patient's old medical records in [facility]  Chief Complaint: Fatigue, low blood pressure  HPI: [name] [name] is a 83 y.o. male with medical history significant for chronic atrial fibrillation/flutter on Eliquis, pancreatic cancer on gemcitabine monotherapy, prostate cancer metastatic to bone, Parkinson's disease, anemia of chronic disease, HLD, OSA on BiPAP who presented to the ED for evaluation of hypotension and fatigue.  Patient lives at assisted living.  He ambulates with use of a walker at baseline.  He says he checked his blood pressure dips morning and saw that his systolic blood pressure was in the 60s.  He says he has noted feeling lightheaded and dizzy lately especially when standing up from sitting position.  He has not lost consciousness for following.  He does note recent shortness of breath and nonproductive cough.  He has not seen any swelling in his lower extremities.  He has a history of chronic atrial flutter and states that his pulse usually varies between 80-120s.  He has a history of symptomatic hypotension and takes diltiazem only if his SBP is >130.  Last dose was yesterday.  He reports adherence to Eliquis.  Denies any obvious bleeding.  [facility] ED Course  Labs/Imaging on admission: I have personally reviewed following labs and imaging studies.  Initial vitals showed BP 100/76, pulse 104, RR 18, temp 97.7 F, SpO2 96% on room air.  Labs showed WBC 4.5, hemoglobin 8.3, platelets 246,000, sodium 136, potassium 4.1, bicarb 24, BUN 25, creatinine 0.84, serum glucose 167, LFTs within normal limits, lactic acid 0.8, troponin 18 > 17, BNP 778.8.  FOBT negative.  CTA chest negative for evidence of PE.  Increased bilateral pleural effusions and interstitial  edema noted.  Stable pulmonary nodules in the right middle lobe and right lower lobe also noted.  CT abdomen/pelvis without change and severe duct dilatation of the pancreas.  No biliary duct dilatation.  Pancreatic mass is difficult to define on CT imaging.  No evidence of metastatic disease in the abdomen/pelvis.  Solitary right kidney appears normal.  Patient was given 1 L normal saline and then IV Lasix 20 mg.  The hospitalist service was consulted to admit.  Review of Systems: All systems reviewed and are negative except as documented in history of present illness above.   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)    Prostate cancer metastatic to bone (HCC)    REM sleep behavior disorder    Sleep apnea    BiPap - uses nightly    Past Surgical History:  Procedure Laterality Date   ANTERIOR APPROACH HEMI HIP ARTHROPLASTY Right 02/05/2023   Procedure: ANTERIOR APPROACH HEMI HIP ARTHROPLASTY;  Surgeon: Jodi Geralds, MD;  Location: [facility] OR;  Service: Orthopedics;  Laterality: Right;   APPENDECTOMY  1963   BIOPSY  12/25/2021   Procedure: BIOPSY;  Surgeon: Meridee Score Netty Starring., MD;  Location: [facility] ENDOSCOPY;  Service: Gastroenterology;;   BRONCHIAL BIOPSY  07/05/2023   Procedure: BRONCHIAL BIOPSIES;  Surgeon: Josephine Igo, DO;  Location:  [facility] ENDOSCOPY;  Service: Pulmonary;;   COLONOSCOPY  2010, 2015   ESOPHAGOGASTRODUODENOSCOPY N/A 04/16/2022   Procedure: ESOPHAGOGASTRODUODENOSCOPY (EGD);  Surgeon: Lemar Lofty., MD;  Location: [facility] ENDOSCOPY;  Service: Gastroenterology;  Laterality: N/A;   ESOPHAGOGASTRODUODENOSCOPY (EGD) WITH PROPOFOL N/A 12/25/2021   Procedure: ESOPHAGOGASTRODUODENOSCOPY (EGD) WITH PROPOFOL;   Surgeon: Meridee Score Netty Starring., MD;  Location: [facility] ENDOSCOPY;  Service: Gastroenterology;  Laterality: N/A;   EUS N/A 12/25/2021   Procedure: UPPER ENDOSCOPIC ULTRASOUND (EUS) RADIAL;  Surgeon: Lemar Lofty., MD;  Location: [facility] ENDOSCOPY;  Service: Gastroenterology;  Laterality: N/A;   EUS N/A 04/16/2022   Procedure: UPPER ENDOSCOPIC ULTRASOUND (EUS) RADIAL;  Surgeon: Lemar Lofty., MD;  Location: [facility] ENDOSCOPY;  Service: Gastroenterology;  Laterality: N/A;   FIDUCIAL MARKER PLACEMENT N/A 04/16/2022   Procedure: FIDUCIAL MARKER PLACEMENT;  Surgeon: Lemar Lofty., MD;  Location: [facility] ENDOSCOPY;  Service: Gastroenterology;  Laterality: N/A;   FINE NEEDLE ASPIRATION  12/25/2021   Procedure: FINE NEEDLE ASPIRATION (FNA) LINEAR;  Surgeon: Lemar Lofty., MD;  Location: [facility] ENDOSCOPY;  Service: Gastroenterology;;   IR IMAGING GUIDED PORT INSERTION  07/28/2023   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   KYPHOPLASTY N/A 08/15/2020   Procedure: L2 compression fracture;  Surgeon: Kennedy Bucker, MD;  Location: [facility] ORS;  Service: Orthopedics;  Laterality: N/A;   KYPHOPLASTY N/A 08/29/2020   Procedure: T8 KYPHOPLASTY;  Surgeon: Kennedy Bucker, MD;  Location: [facility] ORS;  Service: Orthopedics;  Laterality: N/A;   KYPHOPLASTY N/A 11/12/2020   Procedure: L1 KYPHOPLASTY;  Surgeon: Kennedy Bucker, MD;  Location: [facility] ORS;  Service: Orthopedics;  Laterality: N/A;   PORTACATH PLACEMENT N/A 02/05/2022   Procedure: INSERTION PORT-A-CATH;  Surgeon: Fritzi Mandes, MD;  Location: [facility] ORS;  Service: General;  Laterality: N/A;    Social History: Social History   Tobacco Use   Smoking status: Former    Types: Cigars    Quit date: 09/07/2010    Years since quitting: 13.2   Smokeless tobacco: Never  Vaping Use   Vaping status: Never Used  Substance Use Topics   Alcohol use: Not Currently    Alcohol/week: 4.0 standard drinks of alcohol    Types: 4 Standard drinks or equivalent per week    Comment: 2  cans beer/week and occas. wine   Drug use: Never   Allergies  Allergen Reactions   Influenza Vaccines Anaphylaxis    Flu shot: 1974 anaphylaxis. 2nd time swollen arm   Influenza Virus Vaccine Other (See Comments)    Family History  Problem Relation Age of Onset   Heart disease Maternal Grandfather    Diabetes Paternal Grandfather    Breast cancer Daughter    Colon cancer Neg Hx    Esophageal cancer Neg Hx    Stomach cancer Neg Hx      Prior to Admission medications   Medication Sig Start Date End Date Taking? Authorizing Provider  carbidopa-levodopa (SINEMET CR) 50-200 MG tablet TAKE 1 TABLET AT BEDTIME 10/18/23  Yes Tat, Rebecca S, DO  carbidopa-levodopa (SINEMET IR) 25-100 MG tablet Take 3 tablets at 7 AM, 2 tablets at 11 AM, 3 tablets at 3 PM, 2 tablets at 7 PM 10/26/23  Yes Tat, Octaviano Batty, DO  clonazePAM (KLONOPIN) 0.5 MG tablet Take 1 tablet (0.5 mg total) by mouth at bedtime. 02/09/23  Yes Sheikh, Omair Latif, DO  cyanocobalamin (VITAMIN B12) 1000 MCG tablet Take 1,000 mcg by mouth daily.   Yes [provider]  diltiazem (CARDIZEM) 30 MG tablet Take 1  tablet (30 mg total) by mouth 2 (two) times daily. Do not take if systolic blood pressure (top number) is less than 100, or if pulse if less than 55. 11/25/23  Yes Tolia, Sunit, DO  ELIQUIS 5 MG TABS tablet TAKE 1 TABLET TWICE A DAY 03/12/23  Yes Tolia, Sunit, DO  omeprazole (PRILOSEC) 20 MG capsule Take 1 capsule (20 mg total) by mouth daily. 12/02/23  Yes Malachy Mood, MD  ondansetron (ZOFRAN) 8 MG tablet Take 1 tablet (8 mg total) by mouth every 8 (eight) hours as needed for nausea or vomiting. 11/04/23  Yes Malachy Mood, MD  oxyCODONE (OXY IR/ROXICODONE) 5 MG immediate release tablet Take 1 tablet (5 mg total) by mouth every 4 (four) hours as needed for severe pain (pain score 7-10). 11/17/23  Yes Pickenpack-Cousar, Arty Baumgartner, NP  Pancrelipase, Lip-Prot-Amyl, (PANCREAZE) 37000-97300 units CPEP TAKE 2 CAPSULES WITH BREAKFAST, LUNCH, AND  EVENING MEAL (WITH EACH MEAL) AND 1 CAPSULE WITH SNACK 11/13/23  Yes Arnaldo Natal, NP  polyethylene glycol powder (MIRALAX) 17 GM/SCOOP powder Take 17 g by mouth in the morning, at noon, and at bedtime.   Yes [provider]  prochlorperazine (COMPAZINE) 10 MG tablet Take 1 tablet (10 mg total) by mouth every 6 (six) hours as needed for nausea or vomiting. 09/23/23  Yes Pollyann Samples, NP  rosuvastatin (CRESTOR) 10 MG tablet TAKE 1 TABLET DAILY 07/08/23  Yes Tolia, Sunit, DO  tiZANidine (ZANAFLEX) 2 MG tablet Take 1 tablet (2 mg total) by mouth every 8 (eight) hours as needed for muscle spasms. 02/09/23  Yes Sheikh, Omair Latif, DO  acetaminophen (TYLENOL) 500 MG tablet Take 1 tablet (500 mg total) by mouth every 6 (six) hours as needed for moderate pain or mild pain. Patient taking differently: Take 500 mg by mouth as needed for moderate pain (pain score 4-6) or mild pain (pain score 1-3). 04/18/22   Mansouraty, Netty Starring., MD  ammonium lactate (LAC-HYDRIN) 12 % lotion Apply 1 Application topically as needed. 06/22/23   [provider]  bisacodyl (DULCOLAX) 10 MG suppository Place 1 suppository (10 mg total) rectally daily as needed for moderate constipation. 02/09/23   Marguerita Merles Latif, DO  ciclopirox (PENLAC) 8 % solution Apply 1 Application topically at bedtime.  09/29/24  [provider]  Glycerin-Hypromellose-PEG 400 (VISINE DRY EYE OP) Place 1 drop into both eyes 2 (two) times daily as needed (eye irritation).     [provider]  ketoconazole (NIZORAL) 2 % shampoo Apply 1 application  topically 3 (three) times a week. 02/15/19   [provider]  leuprolide, 6 Month, (ELIGARD) 45 MG injection Inject 45 mg into the skin every 6 (six) months.    [provider]  lidocaine-prilocaine (EMLA) cream Apply to port site 1 to 2 hours prior to appointments/treatment 08/13/23   Carlean Jews, NP  triamcinolone cream (KENALOG) 0.1 % Apply 1  application topically 2 (two) times daily. Patient taking differently: Apply 1 application  topically 2 (two) times daily as needed (itching). 01/16/21   Duanne Limerick, MD  zoledronic acid (RECLAST) 5 MG/100ML SOLN injection Inject 5 mg into the vein See admin instructions. Once a year    [provider]    Physical Exam: Vitals:   12/07/23 1230 12/07/23 1300 12/07/23 1500 12/07/23 1814  BP: 100/77 105/87 102/81 94/77  Pulse: (!) 120 92 93   Resp: 18 15 15    Temp:    (!) 95.1 F (35.1 C)  TempSrc:    Axillary  SpO2: 97% 95% 96% 100%  Weight:      Height:       Constitutional: Resting in bed with head elevated, NAD, calm, comfortable Eyes: EOMI, lids and conjunctivae normal ENMT: Mucous membranes are moist. Posterior pharynx clear of any exudate or lesions.Normal dentition.  Neck: normal, supple, no masses. Respiratory: Bibasilar inspiratory crackles. Normal respiratory effort. No accessory muscle use.  Cardiovascular: Irregularly irregular, no murmurs / rubs / gallops. No extremity edema. 2+ pedal pulses.  Port-A-Cath in place right chest wall Abdomen: no tenderness, no masses palpated. Musculoskeletal: no clubbing / cyanosis. No joint deformity upper and lower extremities. Good ROM, no contractures. Normal muscle tone.  Skin: no rashes, lesions, ulcers. No induration Neurologic: Sensation intact. Strength 5/5 in all 4.  Psychiatric: Normal judgment and insight. Alert and oriented x 3. Normal mood.   EKG: Personally reviewed. Atrial flutter, rate 106, RBBB.  Assessment/Plan Principal Problem:   Acute on chronic heart failure with preserved ejection fraction (HFpEF, >= 50%) (HCC) Active Problems:   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)   Chronic atrial flutter (HCC)   Hypotension   Parkinson's disease (HCC)   Prostate cancer metastatic to bone (HCC)   Pancreatic cancer (HCC)   Sleep apnea treated with nocturnal BiPAP   Anemia of chronic disease   [name] [name] is a 83 y.o. male with medical history significant for chronic atrial fibrillation/flutter on Eliquis, pancreatic cancer on gemcitabine monotherapy, prostate cancer metastatic to bone, Parkinson's disease, anemia of chronic disease, HLD, OSA on BiPAP who is admitted with acute on chronic HFpEF with pulmonary edema and hypotension PTA.  Assessment and Plan: Acute on chronic HFpEF Pulmonary edema with bilateral pleural effusions: Last TTE 04/30/2021 showed EF 63%, grade 2 diastolic dysfunction, moderately dilated LA.  Patient presenting with dyspnea, pulmonary edema and pleural effusion seen on imaging, and elevated BNP 778.  Currently saturating well on room air.  Hypotensive PTA with borderline hypotension while in hospital.  Not on diuretic as an outpatient. -Start IV Lasix 20 mg twice daily -Start midodrine 5 mg 3 times daily for BP support while diuresing -Obtain echocardiogram -Strict I/O's and daily weights  Symptomatic hypotension: Lightheaded without syncope and SBP 60s prior to arrival.  SBP has been 90-100 at time of admission. -Starting midodrine as above  Chronic atrial fibrillation/flutter: Remains in atrial flutter on admission.  Variable rate.  Continue home diltiazem 30 mg twice daily with hold parameters for SBP and HR.  Anemia of chronic disease: Hemoglobin 8.3 on admission compared to baseline 9-10.  Denies any obvious bleeding.  FOBT is negative.  Continue to monitor.  Pancreatic cancer: Follows with oncology Dr. Mosetta Putt and on active treatment with gemcitabine.  Last infusion 3/27.  Prostate cancer metastatic to bone: On leuprolide every 6 months.  Parkinson's disease: Continue Sinemet.  Hyperlipidemia: Continue rosuvastatin.  OSA: Continue BiPAP nightly.  Anxiety/sleep: Continue home Klonopin 0.5 mg nightly.  Chronic cancer associated pain: Continue Oxy IR 5 mg every 4 hours as needed for severe pain.   DVT prophylaxis:  apixaban (ELIQUIS) tablet 5  mg   Code Status: Full code, confirmed with patient on admission Family Communication: Discussed with patient, he has discussed with family Disposition Plan: From home and likely return to home pending clinical progress Consults called: None Severity of Illness: The appropriate patient status for this patient is INPATIENT. Inpatient status is judged to be reasonable and necessary in order to provide the required  intensity of service to ensure the patient's safety. The patient's presenting symptoms, physical exam findings, and initial radiographic and laboratory data in the context of their chronic comorbidities is felt to place them at high risk for further clinical deterioration. Furthermore, it is not anticipated that the patient will be medically stable for discharge from the hospital within 2 midnights of admission.   * I certify that at the point of admission it is my clinical judgment that the patient will require inpatient hospital care spanning beyond 2 midnights from the point of admission due to high intensity of service, high risk for further deterioration and high frequency of surveillance required.Darreld Mclean MD [facility]  If 7PM-7AM, please contact night-coverage [email or web address]  12/07/2023, 8:03 PM

## 2023-12-08 ENCOUNTER — Inpatient Hospital Stay (HOSPITAL_COMMUNITY)

## 2023-12-08 DIAGNOSIS — I5021 Acute systolic (congestive) heart failure: Secondary | ICD-10-CM

## 2023-12-08 DIAGNOSIS — G4730 Sleep apnea, unspecified: Secondary | ICD-10-CM

## 2023-12-08 DIAGNOSIS — I5033 Acute on chronic diastolic (congestive) heart failure: Secondary | ICD-10-CM | POA: Diagnosis not present

## 2023-12-08 DIAGNOSIS — I4892 Unspecified atrial flutter: Secondary | ICD-10-CM

## 2023-12-08 LAB — ECHOCARDIOGRAM COMPLETE
AR max vel: 3.57 cm2
AV Area VTI: 3.36 cm2
AV Area mean vel: 3.02 cm2
AV Mean grad: 2 mmHg
AV Peak grad: 3.1 mmHg
Ao pk vel: 0.88 m/s
Area-P 1/2: 3.67 cm2
Height: 64 in
MV VTI: 5.42 cm2
P 1/2 time: 751 ms
S' Lateral: 4.3 cm
Single Plane A4C EF: 37.7 %
Weight: 2218.71 [oz_av]

## 2023-12-08 LAB — CBC
HCT: 28.9 % — ABNORMAL LOW (ref 39.0–52.0)
Hemoglobin: 8.9 g/dL — ABNORMAL LOW (ref 13.0–17.0)
MCH: 31.4 pg (ref 26.0–34.0)
MCHC: 30.8 g/dL (ref 30.0–36.0)
MCV: 102.1 fL — ABNORMAL HIGH (ref 80.0–100.0)
Platelets: 248 10*3/uL (ref 150–400)
RBC: 2.83 MIL/uL — ABNORMAL LOW (ref 4.22–5.81)
RDW: 18.4 % — ABNORMAL HIGH (ref 11.5–15.5)
WBC: 4.4 10*3/uL (ref 4.0–10.5)
nRBC: 0 % (ref 0.0–0.2)

## 2023-12-08 LAB — BASIC METABOLIC PANEL WITH GFR
Anion gap: 6 (ref 5–15)
BUN: 19 mg/dL (ref 8–23)
CO2: 22 mmol/L (ref 22–32)
Calcium: 8.6 mg/dL — ABNORMAL LOW (ref 8.9–10.3)
Chloride: 109 mmol/L (ref 98–111)
Creatinine, Ser: 0.71 mg/dL (ref 0.61–1.24)
GFR, Estimated: >60 mL/min (ref >60)
Glucose, Bld: 92 mg/dL (ref 70–99)
Potassium: 3.9 mmol/L (ref 3.5–5.1)
Sodium: 137 mmol/L (ref 135–145)

## 2023-12-08 LAB — IRON AND TIBC
Iron: 58 ug/dL (ref 45–182)
Saturation Ratios: 19 % (ref 17.9–39.5)
TIBC: 309 ug/dL (ref 250–450)
UIBC: 251 ug/dL

## 2023-12-08 LAB — VITAMIN B12: Vitamin B-12: 1160 pg/mL — ABNORMAL HIGH (ref 180–914)

## 2023-12-08 LAB — MAGNESIUM: Magnesium: 2.3 mg/dL (ref 1.7–2.4)

## 2023-12-08 LAB — FOLATE: Folate: 18 ng/mL (ref >5.9)

## 2023-12-08 LAB — FERRITIN: Ferritin: 234 ng/mL (ref 24–336)

## 2023-12-08 MED ORDER — AMIODARONE HCL 200 MG PO TABS
400.0000 mg | ORAL_TABLET | Freq: Two times a day (BID) | ORAL | Status: DC
Start: 2023-12-08 — End: 2023-12-15
  Administered 2023-12-08 – 2023-12-09 (×2): 400 mg via ORAL
  Filled 2023-12-08 (×2): qty 2

## 2023-12-08 MED ORDER — METOPROLOL SUCCINATE ER 25 MG PO TB24
12.5000 mg | ORAL_TABLET | Freq: Every day | ORAL | Status: DC
  Administered 2023-12-09: 12.5 mg via ORAL
  Filled 2023-12-08: qty 1

## 2023-12-08 MED ORDER — AMIODARONE HCL 200 MG PO TABS: 200.0000 mg | ORAL_TABLET | Freq: Every day | ORAL | Status: DC

## 2023-12-08 NOTE — Consult Note (Addendum)
 Cardiology Consultation   Patient ID: [name] [name] MRN: [id number]; DOB: Sep 12, 1940  Admit date: 12/07/2023 Date of Consult: 12/08/2023  PCP:  Lorenda Ishihara, MD   [facility] Providers Cardiologist:  Tessa Lerner, DO   {  Patient Profile:   [name] [name] is a 83 y.o. 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 Jonathon Bellows.  History of Present Illness:   [name] [name] is a 83 year old male with the above history who is followed by Dr. Odis Hollingshead. He is primarily followed by Cardiology for paroxysmal atrial fibrillation/ flutter. Prior Myoview in 04/2021 was low risk with no evidence of ischemia. Echo from that time showed LVEF of 63% with moderate LVH and grade 2 diastolic dysfunction as well as mild to moderate MR and mild TR. Monitor in 12/2022 showed predominantly sinus rhythm with 7% atrial fibrillation/ flutter burden, one short run of NSVT, and 4% PACs burden. He was seen in the ED in 04/2023 for atrial fibrillation and was cardioverted. He was subsequently referred to and seen in the [facility] in 09/2023 at which time he was in atrial fibrillation with rate of 113 bpm. He reported fatigue with this but was otherwise asymptomatic. He was not felt to a candidate for antiarrhthmic therapy given drug interaction with his chemotherapy and he was continued of home Diltiazem 30mg  twice daily with additional PRN dosing as needed. Of note, he has not been able to tolerate higher doses of Diltiazem due to hypotension.  He was seen in the ED on 10/26/2023 for hypotension with BP of 67/53. EKG showed atrial flutter with variable AV block and rates in the low  100s. He was asymptomatic with this. Lab work was unremarkable. BP improved with IV fluids and he was felt to be stable for discharge.   Patient presented to the ED on 12/07/2023 for further evaluation of hypotension with systolic BP in the 60s. Upon arrival to the ED, BP had improved to 100/76. EKG showed atrial flutter, rate 106 bpm, with RBBB and non-specific T wave changes. High-sensitivity troponin 18 >> 17 not consistent with ACS. BNP elevated at 778. Chest CTA was negative for PE but did show increased bilateral pleural effusion and interstitial edema. WBC 4.6, Hgb 8.3, Plts 246. Na 136, K 4.1, Glcuose 167, BUN 25, Cr 0.84. Albumin 3.3 but otherwise LFTs normal. He was given 1L of NaCl and then one dose of IV Lasix 20mg . He was also started on Midodrine. He was admitted to the Internal Medicine service. Echo today showed LVEF of 35-40% with global hypokinesis and moderate LVH, mildly enlarged RV with mildly reduced RV function, severe biatrial enlargement, and mild MR. Cardiology consulted for further evaluation.   Patient states reports weakness with episode of hypotension yesterday but was otherwise essentially asymptomatic. No lightheadedness, dizziness, or near syncope with this. He states BP is variable at home but systolic BP typically around 110. He denies any chest pain. He does report several months of shortness of breath both at rest and exertion which he attributes to abdominal distension and difficult taking a deep breath. He uses BiPAP at night and denies any orthopnea or PND. No edema. He is usually not aware  of his atrial fibrillation/ flutter and rarely has palpitations. No recent fevers or illnesses. No nausea, vomiting, or diarrhea. No abnormal bleeding in urine or stools. He is feeling better today after treatment with IV Lasix and Midodrine.   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)    Prostate cancer metastatic to bone (HCC)    REM sleep behavior disorder    Sleep apnea    BiPap - uses nightly    Past Surgical History:  Procedure Laterality Date   ANTERIOR APPROACH HEMI HIP ARTHROPLASTY Right 02/05/2023   Procedure: ANTERIOR APPROACH HEMI HIP ARTHROPLASTY;  Surgeon: Jodi Geralds, MD;  Location: [facility] OR;  Service: Orthopedics;  Laterality: Right;   APPENDECTOMY  1963   BIOPSY  12/25/2021   Procedure: BIOPSY;  Surgeon: Meridee Score Netty Starring., MD;  Location: [facility] ENDOSCOPY;  Service: Gastroenterology;;   BRONCHIAL BIOPSY  07/05/2023   Procedure: BRONCHIAL BIOPSIES;  Surgeon: Josephine Igo, DO;  Location: [facility] ENDOSCOPY;  Service: Pulmonary;;   COLONOSCOPY  2010, 2015   ESOPHAGOGASTRODUODENOSCOPY N/A 04/16/2022   Procedure: ESOPHAGOGASTRODUODENOSCOPY (EGD);  Surgeon: Lemar Lofty., MD;  Location: [facility] ENDOSCOPY;  Service: Gastroenterology;  Laterality: N/A;   ESOPHAGOGASTRODUODENOSCOPY (EGD) WITH PROPOFOL N/A 12/25/2021   Procedure: ESOPHAGOGASTRODUODENOSCOPY (EGD) WITH PROPOFOL;  Surgeon: Meridee Score Netty Starring., MD;  Location: [facility] ENDOSCOPY;  Service: Gastroenterology;  Laterality: N/A;   EUS N/A 12/25/2021   Procedure: UPPER ENDOSCOPIC ULTRASOUND (EUS) RADIAL;  Surgeon: Lemar Lofty., MD;  Location: [facility] ENDOSCOPY;  Service: Gastroenterology;  Laterality: N/A;   EUS N/A 04/16/2022   Procedure: UPPER ENDOSCOPIC ULTRASOUND (EUS) RADIAL;  Surgeon: Lemar Lofty., MD;  Location: [facility] ENDOSCOPY;  Service: Gastroenterology;  Laterality: N/A;   FIDUCIAL MARKER PLACEMENT N/A 04/16/2022   Procedure: FIDUCIAL MARKER PLACEMENT;  Surgeon: Lemar Lofty., MD;  Location: [facility] ENDOSCOPY;  Service: Gastroenterology;  Laterality: N/A;   FINE NEEDLE ASPIRATION  12/25/2021   Procedure:  FINE NEEDLE ASPIRATION (FNA) LINEAR;  Surgeon: Lemar Lofty., MD;  Location: [facility] ENDOSCOPY;  Service: Gastroenterology;;   IR IMAGING GUIDED PORT INSERTION  07/28/2023   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   KYPHOPLASTY N/A 08/15/2020   Procedure: L2 compression fracture;  Surgeon: Kennedy Bucker, MD;  Location: [facility] ORS;  Service: Orthopedics;  Laterality: N/A;   KYPHOPLASTY N/A 08/29/2020   Procedure: T8 KYPHOPLASTY;  Surgeon: Kennedy Bucker, MD;  Location: [facility] ORS;  Service: Orthopedics;  Laterality: N/A;   KYPHOPLASTY N/A 11/12/2020   Procedure: L1 KYPHOPLASTY;  Surgeon: Kennedy Bucker, MD;  Location: [facility] ORS;  Service: Orthopedics;  Laterality: N/A;   PORTACATH PLACEMENT N/A 02/05/2022   Procedure: INSERTION PORT-A-CATH;  Surgeon: Fritzi Mandes, MD;  Location: [facility] ORS;  Service: General;  Laterality: N/A;     Home Medications:  Prior to Admission medications   Medication Sig Start Date End Date Taking? Authorizing Provider  acetaminophen (TYLENOL) 500 MG tablet Take 1 tablet (500 mg total) by mouth every 6 (six) hours as needed for moderate pain or mild pain. Patient taking differently: Take 500 mg by mouth as needed for moderate pain (pain score 4-6) or mild pain (pain score 1-3). 04/18/22  Yes Mansouraty, Netty Starring., MD  ammonium lactate (LAC-HYDRIN) 12 %  lotion Apply 1 Application topically as needed. 06/22/23  Yes [provider]  bisacodyl (DULCOLAX) 10 MG suppository Place 1 suppository (10 mg total) rectally daily as needed for moderate constipation. 02/09/23  Yes Sheikh, Omair Latif, DO  carbidopa-levodopa (SINEMET CR) 50-200 MG tablet TAKE 1 TABLET AT BEDTIME 10/18/23  Yes Tat, Rebecca S, DO  carbidopa-levodopa (SINEMET IR) 25-100 MG tablet Take 3 tablets at 7 AM, 2 tablets at 11 AM, 3 tablets at 3 PM, 2 tablets at 7 PM 10/26/23  Yes Tat, Octaviano Batty, DO  ciclopirox (PENLAC) 8 % solution Apply 1 Application topically once a week.  09/29/24 Yes [provider]  clonazePAM  (KLONOPIN) 0.5 MG tablet Take 1 tablet (0.5 mg total) by mouth at bedtime. 02/09/23  Yes Sheikh, Omair Latif, DO  cyanocobalamin (VITAMIN B12) 1000 MCG tablet Take 1,000 mcg by mouth daily.   Yes [provider]  diltiazem (CARDIZEM) 30 MG tablet Take 1 tablet (30 mg total) by mouth 2 (two) times daily. Do not take if systolic blood pressure (top number) is less than 100, or if pulse if less than 55. Patient taking differently: Take 30 mg by mouth 2 (two) times daily as needed. Do not take if systolic blood pressure (top number) is less than 100, or if pulse if less than 55. 11/25/23  Yes Tolia, Sunit, DO  ELIQUIS 5 MG TABS tablet TAKE 1 TABLET TWICE A DAY 03/12/23  Yes Tolia, Sunit, DO  Glycerin-Hypromellose-PEG 400 (VISINE DRY EYE OP) Place 1 drop into both eyes 2 (two) times daily as needed (eye irritation).    Yes [provider]  ketoconazole (NIZORAL) 2 % shampoo Apply 1 application  topically 3 (three) times a week. 02/15/19  Yes [provider]  leuprolide, 6 Month, (ELIGARD) 45 MG injection Inject 45 mg into the skin every 6 (six) months.   Yes [provider]  lidocaine-prilocaine (EMLA) cream Apply to port site 1 to 2 hours prior to appointments/treatment 08/13/23  Yes Boscia, Kathlynn Grate, NP  omeprazole (PRILOSEC) 20 MG capsule Take 1 capsule (20 mg total) by mouth daily. 12/02/23  Yes Malachy Mood, MD  ondansetron (ZOFRAN) 8 MG tablet Take 1 tablet (8 mg total) by mouth every 8 (eight) hours as needed for nausea or vomiting. 11/04/23  Yes Malachy Mood, MD  oxyCODONE (OXY IR/ROXICODONE) 5 MG immediate release tablet Take 1 tablet (5 mg total) by mouth every 4 (four) hours as needed for severe pain (pain score 7-10). 11/17/23  Yes Pickenpack-Cousar, Arty Baumgartner, NP  Pancrelipase, Lip-Prot-Amyl, (PANCREAZE) 37000-97300 units CPEP TAKE 2 CAPSULES WITH BREAKFAST, LUNCH, AND EVENING MEAL (WITH EACH MEAL) AND 1 CAPSULE WITH SNACK 11/13/23  Yes Arnaldo Natal, NP   polyethylene glycol powder (MIRALAX) 17 GM/SCOOP powder Take 17 g by mouth in the morning, at noon, and at bedtime.   Yes [provider]  prochlorperazine (COMPAZINE) 10 MG tablet Take 1 tablet (10 mg total) by mouth every 6 (six) hours as needed for nausea or vomiting. 09/23/23  Yes Pollyann Samples, NP  rosuvastatin (CRESTOR) 10 MG tablet TAKE 1 TABLET DAILY 07/08/23  Yes Tolia, Sunit, DO  tiZANidine (ZANAFLEX) 2 MG tablet Take 1 tablet (2 mg total) by mouth every 8 (eight) hours as needed for muscle spasms. 02/09/23  Yes Sheikh, Omair Latif, DO  triamcinolone cream (KENALOG) 0.1 % Apply 1 application topically 2 (two) times daily. Patient taking differently: Apply 1 application  topically 2 (two) times daily as needed (itching). 01/16/21  Yes Duanne Limerick, MD  zoledronic acid (RECLAST) 5 MG/100ML SOLN injection Inject 5 mg into the vein See admin instructions. Once a year   Yes [provider]    Inpatient Medications: Scheduled Meds:  apixaban  5 mg Oral BID   carbidopa-levodopa  1 tablet Oral QHS   carbidopa-levodopa  2 tablet Oral BID   carbidopa-levodopa  3 tablet Oral BID   Chlorhexidine Gluconate Cloth  6 each Topical Daily   clonazePAM  0.5 mg Oral QHS   diltiazem  30 mg Oral BID   feeding supplement  237 mL Oral BID BM   furosemide  20 mg Intravenous Q12H   midodrine  5 mg Oral TID WC   pantoprazole  40 mg Oral Daily   rosuvastatin  10 mg Oral Daily   sodium chloride flush  10-40 mL Intracatheter Q12H   sodium chloride flush  3 mL Intravenous Q12H   Continuous Infusions:  PRN Meds: acetaminophen **OR** acetaminophen, ondansetron **OR** ondansetron (ZOFRAN) IV, oxyCODONE, senna-docusate, sodium chloride flush  Allergies:    Allergies  Allergen Reactions   Influenza Vaccines Anaphylaxis    Flu shot: 1974 anaphylaxis. 2nd time swollen arm   Influenza Virus Vaccine Other (See Comments)    Social History:   Social History   Socioeconomic History    Marital status: Widowed    Spouse name: Genette   Number of children: 5   Years of education: Not on file   Highest education level: Not on file  Occupational History   Occupation: retired    Comment: Art therapist  Tobacco Use   Smoking status: Former    Types: Cigars    Quit date: 09/07/2010    Years since quitting: 13.2   Smokeless tobacco: Never  Vaping Use   Vaping status: Never Used  Substance and Sexual Activity   Alcohol use: Not Currently    Alcohol/week: 4.0 standard drinks of alcohol    Types: 4 Standard drinks or equivalent per week    Comment: 2 cans beer/week and occas. 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    Right handed   Two story home   Social Drivers of Health   Financial Resource Strain: Not on file  Food Insecurity: No Food Insecurity (12/07/2023)   Hunger Vital Sign    Worried About Running Out of Food in the Last Year: Never true    Ran Out of Food in the Last Year: Never true  Transportation Needs: No Transportation Needs (12/07/2023)   PRAPARE - Administrator, Civil Service (Medical): No    Lack of Transportation (Non-Medical): No  Physical Activity: Not on file  Stress: Not on file  Social Connections: Moderately Isolated (12/07/2023)   Social Connection and Isolation Panel [NHANES]    Frequency of Communication with Friends and Family: More than three times a week    Frequency of Social Gatherings with Friends and Family: More than three times a week    Attends Religious Services: Never    Database administrator or Organizations: Yes    Attends Banker Meetings: 1 to 4 times per year    Marital Status: Widowed  Intimate Partner Violence: Not At Risk (12/07/2023)   Humiliation, Afraid, Rape, and Kick questionnaire  Fear of  Current or Ex-Partner: No    Emotionally Abused: No    Physically Abused: No    Sexually Abused: No    Family History:   Family History  Problem Relation Age of Onset   Heart disease Maternal Grandfather    Diabetes Paternal Grandfather    Breast cancer Daughter    Colon cancer Neg Hx    Esophageal cancer Neg Hx    Stomach cancer Neg Hx      ROS:  Please see the history of present illness.   Physical Exam/Data:   Vitals:   12/07/23 2341 12/08/23 0205 12/08/23 0433 12/08/23 1447  BP:  91/74  (!) 128/102  Pulse: 76   (!) 112  Resp: 20 16  16   Temp:  98.1 F (36.7 C)  98.1 F (36.7 C)  TempSrc:    Oral  SpO2: 96% 99%  100%  Weight:   62.9 kg   Height:        Intake/Output Summary (Last 24 hours) at 12/08/2023 1559 Last data filed at 12/08/2023 1428 Gross per 24 hour  Intake 840 ml  Output 3301 ml  Net -2461 ml      12/08/2023    4:33 AM 12/07/2023   11:07 AM 12/02/2023    2:21 PM  Last 3 Weights  Weight (lbs) 138 lb 10.7 oz 140 lb 139 lb 6.4 oz  Weight (kg) 62.9 kg 63.504 kg 63.231 kg     Body mass index is 23.8 kg/m.  General: 83 y.o. chronically ill appearing Caucasian male resting comfortably in no acute distress. HEENT: Normocephalic and atraumatic. Sclera clear. EOMs intact. Neck: Supple.  No JVD. Heart: Tachycardic with irregularly irregular rhythm. No murmurs, gallops, or rubs.  Lungs: No increased work of breathing. Clear to ausculation bilaterally. No wheezes, rhonchi, or rales.  Extremities: No lower extremity edema.    Skin: Warm and dry. Neuro: Alert and oriented x3. No focal deficits. Psych: Normal affect. Responds appropriately.,   EKG:  The EKG was personally reviewed and demonstrates:  Atrial flutter, rate 106 bpm, with RBBB and non-specific T wave changes.  Telemetry:  Telemetry was personally reviewed and demonstrates:  Atrial flutter vs coarse atrial fibrillation with rates mostly in the 120s to 140s.   Relevant CV Studies:  Echocardiogram  12/08/2023: Impressions: 1. Left ventricular ejection fraction, by estimation, is 35 to 40%. The  left ventricle has moderately decreased function. The left ventricle  demonstrates global hypokinesis. There is moderate left ventricular  hypertrophy. Left ventricular diastolic  parameters are indeterminate.   2. Right ventricular systolic function is mildly reduced. The right  ventricular size is mildly enlarged.   3. Left atrial size was severely dilated.   4. Right atrial size was severely dilated.   5. The mitral valve is normal in structure. Mild mitral valve  regurgitation.   6. The aortic valve is tricuspid. Aortic valve regurgitation is mild. No  aortic stenosis is present.    Laboratory Data:  High Sensitivity Troponin:   Recent Labs  Lab 12/07/23 1219 12/07/23 1426  TROPONINIHS 18* 17     Chemistry Recent Labs  Lab 12/02/23 1335 12/07/23 1219 12/08/23 0458  NA 140 136 137  K 4.3 4.1 3.9  CL 109 106 109  CO2 28 24 22   GLUCOSE 154* 167* 92  BUN 22 25* 19  CREATININE 0.95 0.84 0.71  CALCIUM 9.4 8.7* 8.6*  MG  --   --  2.3  GFRNONAA >60 >60 >60  ANIONGAP 3* 6 6    Recent Labs  Lab 12/02/23 1335 12/07/23 1219  PROT 6.5 5.9*  ALBUMIN 3.7 3.3*  AST 34 38  ALT 8 6  ALKPHOS 73 72  BILITOT 1.0 0.9   Lipids No results for input(s): "CHOL", "TRIG", "HDL", "LABVLDL", "LDLCALC", "CHOLHDL" in the last 168 hours.  Hematology Recent Labs  Lab 12/02/23 1335 12/07/23 1219 12/08/23 0458  WBC 5.6 4.5 4.4  RBC 3.18* 2.68* 2.83*  HGB 9.7* 8.3* 8.9*  HCT 30.7* 26.4* 28.9*  MCV 96.5 98.5 102.1*  MCH 30.5 31.0 31.4  MCHC 31.6 31.4 30.8  RDW 18.5* 18.5* 18.4*  PLT 162 246 248   Thyroid No results for input(s): "TSH", "FREET4" in the last 168 hours.  BNP Recent Labs  Lab 12/07/23 1219  BNP 778.8*    DDimer No results for input(s): "DDIMER" in the last 168 hours.   Radiology/Studies:  ECHOCARDIOGRAM COMPLETE Result Date: 12/08/2023    ECHOCARDIOGRAM REPORT    Patient Name:   [name] [name] Date of Exam: 12/08/2023 Medical Rec #:  [id number]        Height:       64.0 in Accession #:    [id number]       Weight:       138.7 lb Date of Birth:  10/15/1940       BSA:          1.674 m Patient Age:    82 years         BP:           91/74 mmHg Patient Gender: M                HR:           125 bpm. Exam Location:  Inpatient Procedure: 2D Echo, Cardiac Doppler and Color Doppler (Both Spectral and Color            Flow Doppler were utilized during procedure). Indications:    CHF  History:        Patient has prior history of Echocardiogram examinations, most                 recent 04/30/2021. Arrythmias:Atrial Fibrillation.  Sonographer:    Amy Chionchio Referring Phys: [id number] VISHAL R PATEL IMPRESSIONS  1. Left ventricular ejection fraction, by estimation, is 35 to 40%. The left ventricle has moderately decreased function. The left ventricle demonstrates global hypokinesis. There is moderate left ventricular hypertrophy. Left ventricular diastolic parameters are indeterminate.  2. Right ventricular systolic function is mildly reduced. The right ventricular size is mildly enlarged.  3. Left atrial size was severely dilated.  4. Right atrial size was severely dilated.  5. The mitral valve is normal in structure. Mild mitral valve regurgitation.  6. The aortic valve is tricuspid. Aortic valve regurgitation is mild. No aortic stenosis is present. FINDINGS  Left Ventricle: Left ventricular ejection fraction, by estimation, is 35 to 40%. The left ventricle has moderately decreased function. The left ventricle demonstrates global hypokinesis. The left ventricular internal cavity size was normal in size. There is moderate left ventricular hypertrophy. Left ventricular diastolic parameters are indeterminate. Right Ventricle: The right ventricular size is mildly enlarged. No increase in right ventricular wall thickness. Right ventricular systolic function is mildly reduced. Left Atrium:  Left atrial size was severely dilated. Right Atrium: Right atrial size was severely dilated. Pericardium: Trivial pericardial effusion is present. Mitral Valve: The mitral valve is normal in structure. Mild mitral valve  regurgitation. MV peak gradient, 2.1 mmHg. The mean mitral valve gradient is 1.0 mmHg. Tricuspid Valve: The tricuspid valve is normal in structure. Tricuspid valve regurgitation is mild. Aortic Valve: The aortic valve is tricuspid. Aortic valve regurgitation is mild. Aortic regurgitation PHT measures 751 msec. No aortic stenosis is present. Aortic valve mean gradient measures 2.0 mmHg. Aortic valve peak gradient measures 3.1 mmHg. Aortic  valve area, by VTI measures 3.36 cm. Pulmonic Valve: The pulmonic valve was not well visualized. Pulmonic valve regurgitation is not visualized. Aorta: The aortic root is normal in size and structure. IAS/Shunts: The interatrial septum was not well visualized.  LEFT VENTRICLE PLAX 2D LVIDd:         4.50 cm LVIDs:         4.30 cm LV PW:         1.30 cm LV IVS:        1.30 cm LVOT diam:     2.30 cm LV SV:         42 LV SV Index:   25 LVOT Area:     4.15 cm  LV Volumes (MOD) LV vol d, MOD A4C: 96.5 ml LV vol s, MOD A4C: 60.1 ml LV SV MOD A4C:     96.5 ml RIGHT VENTRICLE RV Basal diam:  4.00 cm RV Mid diam:    3.80 cm TAPSE (M-mode): 2.0 cm LEFT ATRIUM              Index        RIGHT ATRIUM           Index LA Vol (A2C):   70.5 ml  42.11 ml/m  RA Area:     25.40 cm LA Vol (A4C):   126.0 ml 75.25 ml/m  RA Volume:   82.10 ml  49.03 ml/m LA Biplane Vol: 97.6 ml  58.29 ml/m  AORTIC VALVE                    PULMONIC VALVE AV Area (Vmax):    3.57 cm     PV Vmax:          1.20 m/s AV Area (Vmean):   3.02 cm     PV Peak grad:     5.8 mmHg AV Area (VTI):     3.36 cm     PR End Diast Vel: 8.64 msec AV Vmax:           88.30 cm/s AV Vmean:          70.300 cm/s AV VTI:            0.125 m AV Peak Grad:      3.1 mmHg AV Mean Grad:      2.0 mmHg LVOT Vmax:         75.80 cm/s  LVOT Vmean:        51.100 cm/s LVOT VTI:          0.101 m LVOT/AV VTI ratio: 0.81 AI PHT:            751 msec  AORTA Ao Root diam: 3.90 cm Ao Asc diam:  3.80 cm MITRAL VALVE              TRICUSPID VALVE MV Area (PHT): 3.67 cm   TR Peak grad:   21.0 mmHg MV Area VTI:   5.42 cm   TR Vmax:        229.00 cm/s MV Peak grad:  2.1 mmHg MV Mean grad:  1.0 mmHg   SHUNTS MV Vmax:       0.72 m/s   Systemic VTI:  0.10 m MV Vmean:      37.7 cm/s  Systemic Diam: 2.30 cm Epifanio Lesches MD Electronically signed by Epifanio Lesches MD Signature Date/Time: 12/08/2023/1:22:28 PM    Final    CT Angio Chest PE W and/or Wo Contrast Result Date: 12/07/2023 CLINICAL DATA:  Concern for pulmonary embolism. Abdominal pain and diarrhea. Pancreatic cancer undergoing therapy. * Tracking Code: BO * EXAM: CT ANGIOGRAPHY CHEST CT ABDOMEN AND PELVIS WITH CONTRAST TECHNIQUE: Multidetector CT imaging of the chest was performed using the standard protocol during bolus administration of intravenous contrast. Multiplanar CT image reconstructions and MIPs were obtained to evaluate the vascular anatomy. Multidetector CT imaging of the abdomen and pelvis was performed using the standard protocol during bolus administration of intravenous contrast. RADIATION DOSE REDUCTION: This exam was performed according to the departmental dose-optimization program which includes automated exposure control, adjustment of the mA and/or kV according to patient size and/or use of iterative reconstruction technique. CONTRAST:  85mL OMNIPAQUE IOHEXOL 350 MG/ML SOLN COMPARISON:  10/01/2023 FINDINGS: CTA CHEST FINDINGS Cardiovascular: No filling defects within the pulmonary arteries to suggest acute pulmonary embolism. Mediastinum/Nodes: No axillary or supraclavicular adenopathy. No mediastinal or hilar adenopathy. No pericardial fluid. Esophagus normal. Port in the anterior chest wall with tip in distal SVC. Lungs/Pleura: Bilateral pleural effusions increased from  prior. Interstitial thickening suggesting interstitial edema is new from prior. Nodular density in the RIGHT lower lobe measuring 13 mm (image 71/4) compared to 12 mm on prior. Nodule in the lateral aspect RIGHT middle lobe measuring 14 mm is unchanged. Musculoskeletal: Review of the MIP images confirms the above findings. CT ABDOMEN and PELVIS FINDINGS Hepatobiliary: Cyst in the superior central LEFT hepatic lobe is unchanged. No duct dilatation evident. Periportal edema is present. Gallbladder is nondistended. Common bile duct nondistended. Pancreas: Severe dilatation of the pancreatic duct in the body and tail. No change from comparison exam. Calcification in the head of the pancreas. Dilatation extends of 2 18 mm in the mid pancreas not changed from prior. Spleen: Normal spleen Adrenals/urinary tract: Solitary RIGHT kidney. Ureter and bladder normal. Stomach/Bowel: Stomach, small bowel, appendix, and cecum are normal. Moderate volume stool throughout the colon. Multiple diverticula of the descending colon and sigmoid colon without acute inflammation. Vascular/Lymphatic: Abdominal aorta is normal caliber with atherosclerotic calcification. There is no retroperitoneal or periportal lymphadenopathy. No pelvic lymphadenopathy. Reproductive: Unremarkable Other: No omental peritoneal metastasis Musculoskeletal: No aggressive osseous lesion. Stable sclerotic lesion in the inferior LEFT ischium. Post RIGHT hip arthroplasty Review of the MIP images confirms the above findings. IMPRESSION: CHEST: 1. No evidence acute pulmonary embolism. 2. Increased bilateral pleural effusions and interstitial edema. 3. Stable pulmonary nodules in the RIGHT middle lobe and RIGHT lower lobe. PELVIS: 1. No change in severe duct dilatation of the pancreas. No biliary duct dilatation. Pancreatic mass is difficult to define CT imaging 2. No evidence of metastatic disease in the abdomen pelvis. 3. Solitary RIGHT kidney appears normal  Electronically Signed   By: Genevive Bi M.D.   On: 12/07/2023 15:49   CT ABDOMEN PELVIS W CONTRAST Result Date: 12/07/2023 CLINICAL DATA:  Concern for pulmonary embolism. Abdominal pain and diarrhea. Pancreatic cancer undergoing therapy. * Tracking Code: BO * EXAM: CT ANGIOGRAPHY CHEST CT ABDOMEN AND PELVIS WITH CONTRAST TECHNIQUE: Multidetector CT imaging of the chest was performed using the standard protocol during bolus administration of intravenous contrast. Multiplanar CT  image reconstructions and MIPs were obtained to evaluate the vascular anatomy. Multidetector CT imaging of the abdomen and pelvis was performed using the standard protocol during bolus administration of intravenous contrast. RADIATION DOSE REDUCTION: This exam was performed according to the departmental dose-optimization program which includes automated exposure control, adjustment of the mA and/or kV according to patient size and/or use of iterative reconstruction technique. CONTRAST:  85mL OMNIPAQUE IOHEXOL 350 MG/ML SOLN COMPARISON:  10/01/2023 FINDINGS: CTA CHEST FINDINGS Cardiovascular: No filling defects within the pulmonary arteries to suggest acute pulmonary embolism. Mediastinum/Nodes: No axillary or supraclavicular adenopathy. No mediastinal or hilar adenopathy. No pericardial fluid. Esophagus normal. Port in the anterior chest wall with tip in distal SVC. Lungs/Pleura: Bilateral pleural effusions increased from prior. Interstitial thickening suggesting interstitial edema is new from prior. Nodular density in the RIGHT lower lobe measuring 13 mm (image 71/4) compared to 12 mm on prior. Nodule in the lateral aspect RIGHT middle lobe measuring 14 mm is unchanged. Musculoskeletal: Review of the MIP images confirms the above findings. CT ABDOMEN and PELVIS FINDINGS Hepatobiliary: Cyst in the superior central LEFT hepatic lobe is unchanged. No duct dilatation evident. Periportal edema is present. Gallbladder is nondistended. Common  bile duct nondistended. Pancreas: Severe dilatation of the pancreatic duct in the body and tail. No change from comparison exam. Calcification in the head of the pancreas. Dilatation extends of 2 18 mm in the mid pancreas not changed from prior. Spleen: Normal spleen Adrenals/urinary tract: Solitary RIGHT kidney. Ureter and bladder normal. Stomach/Bowel: Stomach, small bowel, appendix, and cecum are normal. Moderate volume stool throughout the colon. Multiple diverticula of the descending colon and sigmoid colon without acute inflammation. Vascular/Lymphatic: Abdominal aorta is normal caliber with atherosclerotic calcification. There is no retroperitoneal or periportal lymphadenopathy. No pelvic lymphadenopathy. Reproductive: Unremarkable Other: No omental peritoneal metastasis Musculoskeletal: No aggressive osseous lesion. Stable sclerotic lesion in the inferior LEFT ischium. Post RIGHT hip arthroplasty Review of the MIP images confirms the above findings. IMPRESSION: CHEST: 1. No evidence acute pulmonary embolism. 2. Increased bilateral pleural effusions and interstitial edema. 3. Stable pulmonary nodules in the RIGHT middle lobe and RIGHT lower lobe. PELVIS: 1. No change in severe duct dilatation of the pancreas. No biliary duct dilatation. Pancreatic mass is difficult to define CT imaging 2. No evidence of metastatic disease in the abdomen pelvis. 3. Solitary RIGHT kidney appears normal Electronically Signed   By: Genevive Bi M.D.   On: 12/07/2023 15:49     Assessment and Plan:   Persistent Atrial Fibrillation/ Flutter Patient has a history of persistent atrial fibrillation/ flutter. Monitor in 12/2022 showed 7% atrial fibrillation/ flutter burden. He underwent DCCV in 04/2023 after presenting to the ED in atrial fibrillation. Looks like he has been back in atrial flutter since 07/2023 based off EKGs in the system. He now presents with hypotension. EKG and telemetry show atypical atrial flutter vs  atrial fibrillation with RVR. Echo shows LVEF of 35-40%.  - Currently in atypical atrial flutter vs atrial fibrillation with rates in the 120s to 140s. - Will stop home Diltiazem due to reduced EF and hypotension. - Will start low dose Toprol-XL 12.5mg  daily. - Will load with PO Amiodarone 400mg  twice daily x1 week and then decrease to 200mg  daily. Per [employer], Amiodarone does not cause any major drug interactions with his chemo. - Continue chronic anticoagulation with Eliquis 5mg  twice daily. - Can plan for outpatient DCCV after Amiodarone load.  Acute HFrEF BNP elevated at 778. Chest CTA was negative for  PE but did show increased bilateral pleural effusion and interstitial edema. Echo showed  LVEF of 35-40% with global hypokinesis and moderate LVH, mildly enlarged RV with mildly reduced RV function, severe biatrial enlargement, and mild MR. He was started on IV Lasix with good response. Net negative 1.39 L so far. Renal function stable.  - Does not appear significantly volume overload on exam.  - Will discuss transitioning to PO Lasix with MD. - Will start low dose Toprol-XL 12.5mg  daily. - Will hold off on any other GDMT at this time given hypotension requiring Midodrine. 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 The chemotherapy agents patient is on (Abraxane and Gemcitabine) usually do not cause cardiomyopathy. Recommend repeat Echo after restoration of sinus rhythm to reassess LV function.  Hypotension Patient has a history of soft BP. He presented with systolic BP in the 60s. No obvious signs of infection. Lactic and WBC normal. 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  He received one bolus of IV fluids and was started on Midodrine. - BP improved.  - Will start low dose Toprol-XL as above for rate control. - Continue Midodrine 5mg  twice daily.  Otherwise, per primary team: - Parkinson's disease - Pancreatic cancer - Obstructive  sleep apnea   Risk Assessment/Risk Scores:    New York Heart Association (NYHA) Functional Class NYHA Class II-III  CHA2DS2-VASc Score = 4  This indicates a 4.8% annual risk of stroke. The patient's score is based upon: CHF History: 1 HTN History: 0 Diabetes History: 0 Stroke History: 0 Vascular Disease History: 1 Age Score: 2 Gender Score: 0   For questions or updates, please contact [facility] Please consult [email or web address] for contact info under    Signed, Corrin Parker, PA-C  12/08/2023 3:59 PM  Patient seen and examined with Marjie Skiff, PA-C.  Agree as above, with the following exceptions and changes as noted below.  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 Gen: NAD, thin, CV: iRRR, no murmurs, port over right chest wall, Lungs: clear, Abd: soft, Extrem: Warm, well perfused, no edema, Neuro/Psych: alert and oriented x 3, normal mood and affect. All available labs, radiology testing, previous records reviewed.  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  He was taking diltiazem at home as needed on a rare occasion for rates greater than 120, but now with evidence of systolic heart failure we will transition this to metoprolol.  We will also initiate oral amiodarone.  Patient has had 2 prior cardioversions however has had recurrence of atrial fibrillation.  After amiodarone loading, we may be able to successfully schedule cardioversion, patient is amenable to scheduling this as soon as amiodarone load is complete as an outpatient.  Will use metoprolol for rate control and if adequate rate control is achieved, can consider hospital discharge at that point from a cardiovascular standpoint.  His blood  pressure is often low requiring midodrine, he may not tolerate much in the way of GDMT, we will focus on a rate and rhythm control strategy for the atrial fibrillation as our first attempt to rectify his reduced ejection fraction.  Reasonable to transition IV Lasix to oral Lasix today as he has diuresed and appears relatively well compensated.  Patient has been up and about today  without excessive shortness of breath.  Parke Poisson, MD 12/08/23 5:25 PM

## 2023-12-08 NOTE — Progress Notes (Signed)
 PROGRESS NOTE [name] [name]  WUJ:[id number] DOB: 05-Jun-1941 DOA: 12/07/2023 PCP: Lorenda Ishihara, MD  Brief Narrative/Hospital Course: 83 y.o. male with medical history significant for chronic atrial fibrillation/flutter on Eliquis, pancreatic cancer on gemcitabine monotherapy, prostate cancer metastatic to bone, Parkinson's disease, anemia of chronic disease, HLD, OSA on BiPAP from ALF, uses walker at baseline- presented for evaluation of hypotension in 60s and fatigue from assisted living who is admitted with acute on chronic HFpEF with pulmonary edema and hypotension PTA.  Of note patient was given a 1 Lin the ED did IV Lasix.  Subsequently admitted  Procedures/testing: CT angio chest/CT Abd pelvis> no PE, increased bilateral pleural effusion and interstitial edema, stable pulmonary nodules in the right, Solitary right kidney normal, no evidence of metastatic disease in the abdomen pelvis no change in the severity of dilatation of the pancreas. FOBT negative.  Subjective: Seen and examined this morning He has been ambulating on room air, had good output with IV Lasix Overnight afebrile BP stable, on CPAP. Labs stable anemia panel hemoglobin up to 8.9 normal BMP  Assessment and plan:  Acute on chronic HFpEF Pulmonary edema with bilateral pleural effusions: Tte on  04/30/2021-LVEF 63%, G2DD.  On admission BNP elevated with symptoms of CHF, PTA not on diuretics. Follow-up echocardiogram, continue diuresis with IV Lasix having good response so far.   Continued support for soft blood pressure Monitor intake output, check TSH  Symptomatic hypotension: BP in the 60s PTA, currently stable started on midodrine   Chronic A-fib/flutter: In a flutter, rate variable on Cardizem 30 twice daily continue with holding parameters  ACD: Hb stable 9-10, FOBT negative.  Monitor   Pancreatic cancer: Currently followed bY Dr. Mosetta Putt and on active treatment with gemcitabine.Last infusion 3/27.   CT abdomen reviewed no metastatic disease noticed   Prostate cancer metastatic to bone: On leuprolide every 6 months.   Parkinson's disease Deconditioning debility: Currently living in ALF continue PT OT, continue home Sinemet.   HlD: Continue rosuvastatin.   OSA: Continue BiPAP nightly.   Anxiety/sleep: Continue home Klonopin 0.5 mg nightly.   Chronic cancer associated pain: Continue Oxy IR 5 mg every 4 hours as needed for severe pain.   DVT prophylaxis:  Code Status:   Code Status: Full Code Family Communication: plan of care discussed with patient at bedside. Patient status is: Remains hospitalized because of severity of illness Level of care: Telemetry   Dispo: The patient is from: home            Anticipated disposition: TBD in 24 hrs  Objective: Vitals last 24 hrs: Vitals:   12/07/23 2216 12/07/23 2341 12/08/23 0205 12/08/23 0433  BP: 110/75  91/74   Pulse: (!) 105 76    Resp: 15 20 16    Temp: 98.1 F (36.7 C)  98.1 F (36.7 C)   TempSrc: Oral     SpO2: 97% 96% 99%   Weight:    62.9 kg  Height:       Weight change:   Physical Examination: General exam: alert awake, older than stated age HEENT:Oral mucosa moist, Ear/Nose WNL grossly Respiratory system: Bilaterally diminished BS, no use of accessory muscle Cardiovascular system: S1 & S2 +. Gastrointestinal system: Abdomen soft, NT,ND,BS+ Nervous System: Alert, awake,following commands. Extremities: LE edema neg, moving arms  warm legs Skin: No rashes,warm. MSK: Normal muscle bulk/tone.   Medications reviewed:  Scheduled Meds:  apixaban  5 mg Oral BID   carbidopa-levodopa  1 tablet Oral QHS   carbidopa-levodopa  2 tablet Oral BID   carbidopa-levodopa  3 tablet Oral BID   Chlorhexidine Gluconate Cloth  6 each Topical Daily   clonazePAM  0.5 mg Oral QHS   diltiazem  30 mg Oral BID   feeding supplement  237 mL Oral BID BM   furosemide  20 mg Intravenous Q12H   midodrine  5 mg Oral TID WC    pantoprazole  40 mg Oral Daily   rosuvastatin  10 mg Oral Daily   sodium chloride flush  10-40 mL Intracatheter Q12H   sodium chloride flush  3 mL Intravenous Q12H   Continuous Infusions:    Diet Order             DIET SOFT Room service appropriate? Yes; Fluid consistency: Thin  Diet effective now                  Intake/Output Summary (Last 24 hours) at 12/08/2023 1142 Last data filed at 12/08/2023 1115 Gross per 24 hour  Intake 1901.75 ml  Output 2501 ml  Net -599.25 ml   Net IO Since Admission: -599.25 mL [12/08/23 1142]  Wt Readings from Last 3 Encounters:  12/08/23 62.9 kg  12/02/23 63.2 kg  11/18/23 63 kg     Unresulted Labs (From admission, onward)     Start     Ordered   12/09/23 0500  TSH  Tomorrow morning,   R       Question:  Specimen collection method  Answer:  IV Team=IV Team collect   12/08/23 0854   12/09/23 0500  CBC  Daily,   R     Question:  Specimen collection method  Answer:  IV Team=IV Team collect   12/08/23 0854   12/09/23 0500  Basic metabolic panel with GFR  Daily,   R     Question:  Specimen collection method  Answer:  IV Team=IV Team collect   12/08/23 0854          Data Reviewed: I have personally reviewed following labs and imaging studies ( see epic result tab) CBC: Recent Labs  Lab 12/02/23 1335 12/07/23 1219 12/08/23 0458  WBC 5.6 4.5 4.4  NEUTROABS 3.6 3.6  --   HGB 9.7* 8.3* 8.9*  HCT 30.7* 26.4* 28.9*  MCV 96.5 98.5 102.1*  PLT 162 246 248   CMP: Recent Labs  Lab 12/02/23 1335 12/07/23 1219 12/08/23 0458  NA 140 136 137  K 4.3 4.1 3.9  CL 109 106 109  CO2 28 24 22   GLUCOSE 154* 167* 92  BUN 22 25* 19  CREATININE 0.95 0.84 0.71  CALCIUM 9.4 8.7* 8.6*  MG  --   --  2.3   GFR: Estimated Creatinine Clearance: 59.6 mL/min (by C-G formula based on SCr of 0.71 mg/dL). Recent Labs  Lab 12/02/23 1335 12/07/23 1219  AST 34 38  ALT 8 6  ALKPHOS 73 72  BILITOT 1.0 0.9  PROT 6.5 5.9*  ALBUMIN 3.7 3.3*    Recent Labs  Lab 12/07/23 1219  LATICACIDVEN 0.8   No results found for this or any previous visit (from the past 240 hours).   Antimicrobials/Microbiology: Anti-infectives (From admission, onward)    None         Component Value Date/Time   SDES  07/31/2023 1315    BLOOD LEFT ANTECUBITAL Performed at [facility], [address], [place], [place] [address]    [facility]  07/31/2023 1315    BOTTLES DRAWN AEROBIC AND ANAEROBIC Blood  Culture adequate volume Performed at [facility], [address], [place], [place] [address]    CULT  07/31/2023 1315    NO GROWTH 5 DAYS Performed at [facility], [address] [place], [place] [address]    REPTSTATUS 08/05/2023 FINAL 07/31/2023 1315     Radiology Studies: CT Angio Chest PE W and/or Wo Contrast Result Date: 12/07/2023 CLINICAL DATA:  Concern for pulmonary embolism. Abdominal pain and diarrhea. Pancreatic cancer undergoing therapy. * Tracking Code: BO * EXAM: CT ANGIOGRAPHY CHEST CT ABDOMEN AND PELVIS WITH CONTRAST TECHNIQUE: Multidetector CT imaging of the chest was performed using the standard protocol during bolus administration of intravenous contrast. Multiplanar CT image reconstructions and MIPs were obtained to evaluate the vascular anatomy. Multidetector CT imaging of the abdomen and pelvis was performed using the standard protocol during bolus administration of intravenous contrast. RADIATION DOSE REDUCTION: This exam was performed according to the departmental dose-optimization program which includes automated exposure control, adjustment of the mA and/or kV according to patient size and/or use of iterative reconstruction technique. CONTRAST:  85mL OMNIPAQUE IOHEXOL 350 MG/ML SOLN COMPARISON:  10/01/2023 FINDINGS: CTA CHEST FINDINGS Cardiovascular: No filling defects within the pulmonary arteries to suggest acute pulmonary embolism. Mediastinum/Nodes: No axillary or  supraclavicular adenopathy. No mediastinal or hilar adenopathy. No pericardial fluid. Esophagus normal. Port in the anterior chest wall with tip in distal SVC. Lungs/Pleura: Bilateral pleural effusions increased from prior. Interstitial thickening suggesting interstitial edema is new from prior. Nodular density in the RIGHT lower lobe measuring 13 mm (image 71/4) compared to 12 mm on prior. Nodule in the lateral aspect RIGHT middle lobe measuring 14 mm is unchanged. Musculoskeletal: Review of the MIP images confirms the above findings. CT ABDOMEN and PELVIS FINDINGS Hepatobiliary: Cyst in the superior central LEFT hepatic lobe is unchanged. No duct dilatation evident. Periportal edema is present. Gallbladder is nondistended. Common bile duct nondistended. Pancreas: Severe dilatation of the pancreatic duct in the body and tail. No change from comparison exam. Calcification in the head of the pancreas. Dilatation extends of 2 18 mm in the mid pancreas not changed from prior. Spleen: Normal spleen Adrenals/urinary tract: Solitary RIGHT kidney. Ureter and bladder normal. Stomach/Bowel: Stomach, small bowel, appendix, and cecum are normal. Moderate volume stool throughout the colon. Multiple diverticula of the descending colon and sigmoid colon without acute inflammation. Vascular/Lymphatic: Abdominal aorta is normal caliber with atherosclerotic calcification. There is no retroperitoneal or periportal lymphadenopathy. No pelvic lymphadenopathy. Reproductive: Unremarkable Other: No omental peritoneal metastasis Musculoskeletal: No aggressive osseous lesion. Stable sclerotic lesion in the inferior LEFT ischium. Post RIGHT hip arthroplasty Review of the MIP images confirms the above findings. IMPRESSION: CHEST: 1. No evidence acute pulmonary embolism. 2. Increased bilateral pleural effusions and interstitial edema. 3. Stable pulmonary nodules in the RIGHT middle lobe and RIGHT lower lobe. PELVIS: 1. No change in severe duct  dilatation of the pancreas. No biliary duct dilatation. Pancreatic mass is difficult to define CT imaging 2. No evidence of metastatic disease in the abdomen pelvis. 3. Solitary RIGHT kidney appears normal Electronically Signed   By: Genevive Bi M.D.   On: 12/07/2023 15:49   CT ABDOMEN PELVIS W CONTRAST Result Date: 12/07/2023 CLINICAL DATA:  Concern for pulmonary embolism. Abdominal pain and diarrhea. Pancreatic cancer undergoing therapy. * Tracking Code: BO * EXAM: CT ANGIOGRAPHY CHEST CT ABDOMEN AND PELVIS WITH CONTRAST TECHNIQUE: Multidetector CT imaging of the chest was performed using the standard protocol during bolus administration of intravenous contrast. Multiplanar CT  image reconstructions and MIPs were obtained to evaluate the vascular anatomy. Multidetector CT imaging of the abdomen and pelvis was performed using the standard protocol during bolus administration of intravenous contrast. RADIATION DOSE REDUCTION: This exam was performed according to the departmental dose-optimization program which includes automated exposure control, adjustment of the mA and/or kV according to patient size and/or use of iterative reconstruction technique. CONTRAST:  85mL OMNIPAQUE IOHEXOL 350 MG/ML SOLN COMPARISON:  10/01/2023 FINDINGS: CTA CHEST FINDINGS Cardiovascular: No filling defects within the pulmonary arteries to suggest acute pulmonary embolism. Mediastinum/Nodes: No axillary or supraclavicular adenopathy. No mediastinal or hilar adenopathy. No pericardial fluid. Esophagus normal. Port in the anterior chest wall with tip in distal SVC. Lungs/Pleura: Bilateral pleural effusions increased from prior. Interstitial thickening suggesting interstitial edema is new from prior. Nodular density in the RIGHT lower lobe measuring 13 mm (image 71/4) compared to 12 mm on prior. Nodule in the lateral aspect RIGHT middle lobe measuring 14 mm is unchanged. Musculoskeletal: Review of the MIP images confirms the above  findings. CT ABDOMEN and PELVIS FINDINGS Hepatobiliary: Cyst in the superior central LEFT hepatic lobe is unchanged. No duct dilatation evident. Periportal edema is present. Gallbladder is nondistended. Common bile duct nondistended. Pancreas: Severe dilatation of the pancreatic duct in the body and tail. No change from comparison exam. Calcification in the head of the pancreas. Dilatation extends of 2 18 mm in the mid pancreas not changed from prior. Spleen: Normal spleen Adrenals/urinary tract: Solitary RIGHT kidney. Ureter and bladder normal. Stomach/Bowel: Stomach, small bowel, appendix, and cecum are normal. Moderate volume stool throughout the colon. Multiple diverticula of the descending colon and sigmoid colon without acute inflammation. Vascular/Lymphatic: Abdominal aorta is normal caliber with atherosclerotic calcification. There is no retroperitoneal or periportal lymphadenopathy. No pelvic lymphadenopathy. Reproductive: Unremarkable Other: No omental peritoneal metastasis Musculoskeletal: No aggressive osseous lesion. Stable sclerotic lesion in the inferior LEFT ischium. Post RIGHT hip arthroplasty Review of the MIP images confirms the above findings. IMPRESSION: CHEST: 1. No evidence acute pulmonary embolism. 2. Increased bilateral pleural effusions and interstitial edema. 3. Stable pulmonary nodules in the RIGHT middle lobe and RIGHT lower lobe. PELVIS: 1. No change in severe duct dilatation of the pancreas. No biliary duct dilatation. Pancreatic mass is difficult to define CT imaging 2. No evidence of metastatic disease in the abdomen pelvis. 3. Solitary RIGHT kidney appears normal Electronically Signed   By: Genevive Bi M.D.   On: 12/07/2023 15:49    LOS: 1 day   Total time spent in review of labs and imaging, patient evaluation, formulation of plan, documentation and communication with patient/family: 50 minutes  Lanae Boast, MD [facility] 12/08/2023, 11:42 AM

## 2023-12-08 NOTE — Progress Notes (Signed)
 Heart Failure Navigator Progress Note  Assessed for Heart & Vascular TOC clinic readiness.  Patient does not meet criteria due to EF 50-55%, per MD note patient with Metastatic cancer. No HF TOC. .   Navigator will sign off at this time.   Rhae Hammock, BSN, Scientist, clinical (histocompatibility and immunogenetics) Only

## 2023-12-08 NOTE — TOC Initial Note (Signed)
 Transition of Care [facility]) - Initial/Assessment Note   Patient Details  Name: [name] [name] MRN: [id number] Date of Birth: 03/06/1941  Transition of Care [facility]) CM/SW Contact:    Ewing Schlein, LCSW Phone Number: 12/08/2023, 12:44 PM  Clinical Narrative: Patient is from independent living at [facility], which was confirmed by the facility. Patient is expected to return to ILF at discharge. TOC following for possible discharge needs.         Expected Discharge Plan: Home/Self Care Barriers to Discharge: Continued Medical Work up  Expected Discharge Plan and Services In-house Referral: Clinical Social Work Living arrangements for the past 2 months: Independent Living Facility           DME Arranged: N/A DME Agency: NA  Prior Living Arrangements/Services Living arrangements for the past 2 months: Independent Living Facility Lives with:: Self Patient language and need for interpreter reviewed:: Yes Do you feel safe going back to the place where you live?: Yes      Need for Family Participation in Patient Care: Yes (Comment) Care giver support system in place?: Yes (comment) Current home services: DME (Rollator) Criminal Activity/Legal Involvement Pertinent to Current Situation/Hospitalization: No - Comment as needed  Activities of Daily Living ADL Screening (condition at time of admission) Independently performs ADLs?: Yes (appropriate for developmental age) Is the patient deaf or have difficulty hearing?: No Does the patient have difficulty seeing, even when wearing glasses/contacts?: No Does the patient have difficulty concentrating, remembering, or making decisions?: No  Emotional Assessment Orientation: : Oriented to Self, Oriented to Place, Oriented to  Time, Oriented to Situation Alcohol / Substance Use: Not Applicable Psych Involvement: No (comment)  Admission diagnosis:  Near syncope [R55] 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) [I50.1] Hypotension, unspecified  hypotension type [I95.9] Patient Active Problem List   Diagnosis Date Noted   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) 12/07/2023   Chronic atrial flutter (HCC) 12/07/2023   Acute on chronic heart failure with preserved ejection fraction (HFpEF, >= 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) 12/07/2023   Hypotension 12/07/2023   Anemia of chronic disease 12/07/2023   Hypercoagulable state due to paroxysmal atrial fibrillation (HCC) 09/24/2023   Infection due to Port-A-Cath 07/31/2023   Port-A-Cath in place 07/29/2023   Lung nodules 06/24/2023   Long term (current) use of anticoagulants 05/08/2023   Other constipation 04/22/2023   A-fib (HCC) 02/03/2023   Displaced fracture of right femoral neck (HCC) 02/03/2023   Femur fracture, right (HCC) 02/03/2023   Genetic testing 02/10/2022   Local infection due to Port-A-Cath 02/10/2022   Pancreatic cancer (HCC) 01/01/2022   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) 07/08/2020   Atherosclerosis of aorta (HCC) 04/10/2020   Heart palpitations 12/29/2019   Encounter for antineoplastic chemotherapy 07/17/2019   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/24/2019   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/30/2015   Parkinson's disease (HCC) 11/07/2015   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 [place] [address] Phone:  [phone] Fax: [phone]  [facility] [address] [place] [address] Phone:  [phone] Fax: [phone]  [facility] [address] [place] [address] Phone: [phone] Fax: [phone]  Social Drivers of Health (SDOH) Social History: SDOH Screenings   Food Insecurity: No Food Insecurity (12/07/2023)  Housing: Low Risk  (12/07/2023)  Transportation Needs: No Transportation Needs (12/07/2023)  Utilities: Not At Risk (12/07/2023)  Depression (PHQ2-9): Low Risk  (12/23/2020)  Recent Concern: Depression (PHQ2-9) - Medium Risk (11/26/2020)  Social Connections: Moderately Isolated (12/07/2023)  Tobacco Use: Medium Risk (12/07/2023)   SDOH Interventions:    Readmission Risk Interventions    08/02/2023   12:37 PM  Readmission Risk Prevention Plan  Transportation Screening Complete  PCP or Specialist Appt within 3-5 Days Complete  HRI or Home Care Consult Complete  Social Work Consult for Recovery Care Planning/Counseling Complete  Palliative Care Screening Not Applicable  Medication Review Oceanographer) Complete

## 2023-12-08 NOTE — Progress Notes (Signed)
 Pt becoming increasingly tired.  MD gave permission to place male purwick for the night after administration of IV lasix.  Night shift informed.

## 2023-12-09 ENCOUNTER — Other Ambulatory Visit (HOSPITAL_COMMUNITY): Payer: Self-pay

## 2023-12-09 DIAGNOSIS — I5033 Acute on chronic diastolic (congestive) heart failure: Secondary | ICD-10-CM | POA: Diagnosis not present

## 2023-12-09 LAB — BASIC METABOLIC PANEL WITH GFR
Anion gap: 10 (ref 5–15)
BUN: 18 mg/dL (ref 8–23)
CO2: 23 mmol/L (ref 22–32)
Calcium: 8.9 mg/dL (ref 8.9–10.3)
Chloride: 105 mmol/L (ref 98–111)
Creatinine, Ser: 0.88 mg/dL (ref 0.61–1.24)
GFR, Estimated: >60 mL/min (ref >60)
Glucose, Bld: 110 mg/dL — ABNORMAL HIGH (ref 70–99)
Potassium: 3.5 mmol/L (ref 3.5–5.1)
Sodium: 138 mmol/L (ref 135–145)

## 2023-12-09 LAB — CBC
HCT: 32.1 % — ABNORMAL LOW (ref 39.0–52.0)
Hemoglobin: 10.1 g/dL — ABNORMAL LOW (ref 13.0–17.0)
MCH: 31 pg (ref 26.0–34.0)
MCHC: 31.5 g/dL (ref 30.0–36.0)
MCV: 98.5 fL (ref 80.0–100.0)
Platelets: 291 10*3/uL (ref 150–400)
RBC: 3.26 MIL/uL — ABNORMAL LOW (ref 4.22–5.81)
RDW: 18.2 % — ABNORMAL HIGH (ref 11.5–15.5)
WBC: 3.4 10*3/uL — ABNORMAL LOW (ref 4.0–10.5)
nRBC: 0 % (ref 0.0–0.2)

## 2023-12-09 LAB — TSH: TSH: 2.649 u[IU]/mL (ref 0.350–4.500)

## 2023-12-09 MED ORDER — POLYVINYL ALCOHOL 1.4 % OP SOLN
1.0000 [drp] | OPHTHALMIC | Status: DC | PRN
  Administered 2023-12-09: 1 [drp] via OPHTHALMIC
  Filled 2023-12-09: qty 15

## 2023-12-09 MED ORDER — METOPROLOL SUCCINATE ER 25 MG PO TB24
25.0000 mg | ORAL_TABLET | Freq: Every day | ORAL | 0 refills | Status: AC
  Filled 2023-12-09: qty 30, 30d supply, fill #0

## 2023-12-09 MED ORDER — METOPROLOL SUCCINATE ER 25 MG PO TB24: 25.0000 mg | ORAL_TABLET | Freq: Every day | ORAL | Status: DC

## 2023-12-09 MED ORDER — HEPARIN SOD (PORK) LOCK FLUSH 100 UNIT/ML IV SOLN
500.0000 [IU] | INTRAVENOUS | Status: AC | PRN
  Administered 2023-12-09: 500 [IU]
  Filled 2023-12-09: qty 5

## 2023-12-09 MED ORDER — FUROSEMIDE 20 MG PO TABS
20.0000 mg | ORAL_TABLET | Freq: Every day | ORAL | 0 refills | Status: DC | PRN
  Filled 2023-12-09: qty 30, 30d supply, fill #0

## 2023-12-09 MED ORDER — FUROSEMIDE 20 MG PO TABS
20.0000 mg | ORAL_TABLET | Freq: Every day | ORAL | Status: DC
  Administered 2023-12-09: 20 mg via ORAL
  Filled 2023-12-09: qty 1

## 2023-12-09 MED ORDER — AMIODARONE HCL 200 MG PO TABS
ORAL_TABLET | ORAL | 0 refills | Status: DC
  Filled 2023-12-09: qty 30, 30d supply, fill #0

## 2023-12-09 MED ORDER — METOPROLOL SUCCINATE ER 25 MG PO TB24
12.5000 mg | ORAL_TABLET | Freq: Once | ORAL | Status: AC
  Administered 2023-12-09: 12.5 mg via ORAL
  Filled 2023-12-09: qty 1

## 2023-12-09 MED ORDER — MIDODRINE HCL 5 MG PO TABS
5.0000 mg | ORAL_TABLET | Freq: Three times a day (TID) | ORAL | 0 refills | Status: AC
  Filled 2023-12-09: qty 90, 30d supply, fill #0

## 2023-12-09 NOTE — TOC Transition Note (Signed)
 Transition of Care [facility]) - Discharge Note  Patient Details  Name: [name] [name] MRN: [id number] Date of Birth: 11/29/1940  Transition [facility] Care [facility]) CM/SW Contact:  Ewing Schlein, LCSW Phone Number: 12/09/2023, 3:21 PM  Clinical Narrative: PT evaluation recommended HH and orders have been placed for HHPT/OT. [facility] provide HHPT/OT in-house at [facility] and patient is agreeable to being referred to [facility]. CSW made referral to [facility] with [facility], which was accepted. HH orders faxed to [facility] [phone]).  Final next level of care: Home w Home Health Services Barriers to Discharge: Barriers Resolved  Patient Goals and CMS Choice Patient states their goals for this hospitalization and ongoing recovery are:: Get [facility] through [facility] Medicare.gov Compare Post Acute Care list provided to:: Patient Choice offered to / list presented to : Patient  Discharge Plan and Services Additional resources added to the After Visit Summary for   In-house Referral: Clinical Social Work   DME Arranged: N/A DME Agency: NA HH Arranged: PT, OT HH Agency: Other - See comment International aid/development worker) Date HH Agency Contacted: 12/09/23 Time HH Agency Contacted: 1510 Representative spoke with at [facility] Agency: Rene Kocher  Social Drivers of Health (SDOH) Interventions SDOH Screenings   Food Insecurity: No Food Insecurity (12/07/2023)  Housing: Low Risk  (12/07/2023)  Transportation Needs: No Transportation Needs (12/07/2023)  Utilities: Not At Risk (12/07/2023)  Depression (PHQ2-9): Low Risk  (12/23/2020)  Recent Concern: Depression (PHQ2-9) - Medium Risk (11/26/2020)  Social Connections: Moderately Isolated (12/07/2023)  Tobacco Use: Medium Risk (12/07/2023)   Readmission Risk Interventions    08/02/2023   12:37 PM  Readmission Risk Prevention Plan  Transportation Screening Complete  PCP or Specialist Appt within 3-5 Days Complete  HRI or Home Care Consult Complete  Social Work Consult for Recovery Care Planning/Counseling  Complete  Palliative Care Screening Not Applicable  Medication Review Oceanographer) Complete

## 2023-12-09 NOTE — Progress Notes (Signed)
 Discharge medications delivered to bedside [facility]

## 2023-12-09 NOTE — Evaluation (Signed)
 Physical Therapy Evaluation Patient Details Name: [name] [name] MRN: [id number] DOB: 1940-12-26 Today's Date: 12/09/2023  History of Present Illness  [name] [name] is a 83 y.o. male with medical history significant for chronic atrial fibrillation/flutter on Eliquis, pancreatic cancer on gemcitabine monotherapy, prostate cancer metastatic to bone, Parkinson's disease, anemia of chronic disease, HLD, OSA on BiPAP who presented to the ED for evaluation of hypotension and fatigue.  Clinical Impression  Pt admitted with above diagnosis. Pt I recliner with nursing staff present to begin session. Pt motivated to move around. Pt is able to complete 2 bouts of amb from recliner to bed, bout of 61ft into the hallway with 4WW at CGA/SBA. Pt HR inc to 134 with walking, recovers to 124 with recovery about 5 mins.  Pt currently with functional limitations due to the deficits listed below (see PT Problem List). Pt will benefit from acute skilled PT to increase their independence and safety with mobility to allow discharge.           If plan is discharge home, recommend the following: A little help with walking and/or transfers;A little help with bathing/dressing/bathroom;Assistance with cooking/housework;Assist for transportation   Can travel by private vehicle        Equipment Recommendations None recommended by PT  Recommendations for Other Services       Functional Status Assessment Patient has had a recent decline in their functional status and demonstrates the ability to make significant improvements in function in a reasonable and predictable amount of time.     Precautions / Restrictions Precautions Precautions: Fall Recall of Precautions/Restrictions: Intact Restrictions Weight Bearing Restrictions Per Provider Order: No      Mobility  Bed Mobility Overal bed mobility: Needs Assistance Bed Mobility: Sit to Supine       Sit to supine: Contact guard assist   General bed  mobility comments: in recliner when PT arrives, returns to bed with minimal assistance, requests HHA for repositioning once supine    Transfers Overall transfer level: Needs assistance Equipment used: Rollator (4 wheels) Transfers: Sit to/from Stand Sit to Stand: Supervision                Ambulation/Gait Ambulation/Gait assistance: Supervision Gait Distance (Feet): 60 Feet Assistive device: Rollator (4 wheels) Gait Pattern/deviations: Step-through pattern, Decreased stride length, Trunk flexed, Wide base of support, Shuffle Gait velocity: dec     General Gait Details: P5t uses 4WW, shortened stride with slight shuffle pattern, good stability, maintains gait line, trunk flexed but keeps head up  Stairs            Wheelchair Mobility     Tilt Bed    Modified Rankin (Stroke Patients Only)       Balance Overall balance assessment: Needs assistance Sitting-balance support: No upper extremity supported Sitting balance-Leahy Scale: Good     Standing balance support: Single extremity supported, Reliant on assistive device for balance, During functional activity Standing balance-Leahy Scale: Fair                               Pertinent Vitals/Pain Pain Assessment Pain Assessment: No/denies pain    Home Living Family/patient expects to be discharged to:: Assisted living Living Arrangements: Alone               Home Equipment: Grab bars - tub/shower;Rollator (4 wheels);Adaptive equipment;Shower seat Additional Comments: resides at [facility]    Prior Function Prior Level of  Function : Needs assist       Physical Assist : ADLs (physical)       ADLs Comments: SBA with bathing and grooming     Extremity/Trunk Assessment        Lower Extremity Assessment Lower Extremity Assessment: Generalized weakness    Cervical / Trunk Assessment Cervical / Trunk Assessment: Kyphotic  Communication   Communication Communication: No  apparent difficulties    Cognition Arousal: Alert Behavior During Therapy: WFL for tasks assessed/performed   PT - Cognitive impairments: No apparent impairments                         Following commands: Intact       Cueing Cueing Techniques: Verbal cues, Gestural cues     General Comments General comments (skin integrity, edema, etc.): HR remains 120 plus throughout session with HR elevation after amb to 134bpm    Exercises General Exercises - Lower Extremity Long Arc Quad: AROM, Both, 10 reps, Seated Other Exercises Other Exercises: seated PNF D2 pattern alternating between reps x10 BLE Other Exercises: slouch overcorrect x10 sitting EOB or in chair   Assessment/Plan    PT Assessment Patient needs continued PT services  PT Problem List Decreased strength;Decreased activity tolerance;Decreased mobility;Cardiopulmonary status limiting activity;Decreased balance;Decreased coordination       PT Treatment Interventions DME instruction;Functional mobility training;Balance training;Patient/family education;Gait training;Therapeutic activities;Therapeutic exercise    PT Goals (Current goals can be found in the Care Plan section)  Acute Rehab PT Goals Patient Stated Goal: improve mobility and spend less time in bed PT Goal Formulation: With patient Time For Goal Achievement: 12/23/23 Potential to Achieve Goals: Good    Frequency Min 2X/week     Co-evaluation               AM-PAC PT "6 Clicks" Mobility  Outcome Measure Help needed turning from your back to your side while in a flat bed without using bedrails?: None Help needed moving from lying on your back to sitting on the side of a flat bed without using bedrails?: A Little Help needed moving to and from a bed to a chair (including a wheelchair)?: A Little Help needed standing up from a chair using your arms (e.g., wheelchair or bedside chair)?: A Little Help needed to walk in hospital room?: A  Little Help needed climbing 3-5 steps with a railing? : A Lot 6 Click Score: 18    End of Session Equipment Utilized During Treatment: Gait belt Activity Tolerance: Patient tolerated treatment well Patient left: in bed;with call bell/phone within reach;with bed alarm set Nurse Communication: Mobility status PT Visit Diagnosis: Unsteadiness on feet (R26.81);Muscle weakness (generalized) (M62.81);Difficulty in walking, not elsewhere classified (R26.2)    Time: 2841-3244 PT Time Calculation (min) (ACUTE ONLY): 28 min   Charges:   PT Evaluation $PT Eval Low Complexity: 1 Low PT Treatments $Gait Training: 8-22 mins PT General Charges $$ ACUTE PT VISIT: 1 Visit         Madaline Guthrie, PT Acute Rehabilitation Services Office: [phone] 12/09/2023   Evelena Peat 12/09/2023, 11:30 AM

## 2023-12-09 NOTE — Discharge Summary (Signed)
 Physician Discharge Summary  [name] [name] ZHY:[id number] DOB: May 01, 1941 DOA: 12/07/2023  PCP: Lorenda Ishihara, MD  Admit date: 12/07/2023 Discharge date: 12/09/2023 Recommendations for Outpatient Follow-up:  Follow up with PCP in 1 weeks-call for appointment Please obtain BMP/CBC in one week  Discharge Dispo: ALF Discharge Condition: Stable Code Status:   Code Status: Full Code Diet recommendation:  Diet Order             DIET SOFT Room service appropriate? Yes; Fluid consistency: Thin  Diet effective now                  Brief/Interim Summary: 83 y.o. male with medical history significant for chronic atrial fibrillation/flutter on Eliquis, pancreatic cancer on gemcitabine monotherapy, prostate cancer metastatic to bone, Parkinson's disease, anemia of chronic disease, HLD, OSA on BiPAP from ALF, uses walker at baseline- presented for evaluation of hypotension in 60s and fatigue from assisted living who is admitted with acute on chronic HFpEF with pulmonary edema and hypotension PTA.  Of note patient was given a 1 Lin the ED did IV Lasix.  Subsequently admitted Underwent echo that showed EF decreased to 35-40% seen by cardiology GDMT adjusted diuresed.  At this time vital stable BP doing well on midodrine, being managed with Eliquis, amiodarone.  Procedures/testing: CT angio chest/CT Abd pelvis> no PE, increased bilateral pleural effusion and interstitial edema, stable pulmonary nodules in the right, Solitary right kidney normal, no evidence of metastatic disease in the abdomen pelvis no change in the severity of dilatation of the pancreas. FOBT >>negative. TTE>> EF 35-40% global hypokinesia LV RV mildly enlarged and mildly reduced function  Subjective: Seen and examined this morning Feels weak on room air otherwise no complaints Overnight afebrile BP stable Labs reviewed stable electrolytes CBC with hemoglobin 10.1 g up Good UOP 3050 with -2.7 L, weight down 140>138>132  lb.  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 On admission BNP elevated with symptoms of CHF, PTA not on diuretics.  Symptoms improved with IV Lasix transition to p.o. Cardizem discontinued changed to Toprol due to low EF.  GDMT limited due to hypotension. Volume status improved. Going home on gdmt, prn lasix and fu as OP w/ cardio clinic.  Symptomatic hypotension: BP in the 60s PTA.  BP currently stable needing midodrine.   Persistent A-fib/flutter: 2 previous cardioversion.  Cardiology planning for amiodarone and then outpatient cardioversion. Cardizem discontinued due to low EF, now on metoprolol.  ACD: Hb stable 9-10, FOBT negative. Monitor   Pancreatic cancer: Currently followed bY Dr. Mosetta Putt and on active treatment with gemcitabine.Last infusion 3/27.  CT abdomen reviewed no metastatic disease noticed   Prostate cancer metastatic to bone: On leuprolide every 6 months.   Parkinson's disease Deconditioning debility: Currently living in ALF continue PT OT, continue home Sinemet.   HLD: Continue rosuvastatin.   OSA: Continue cpap nightly.   Anxiety/sleep: Mood stable, continue home Klonopin 0.5 mg nightly.   Chronic cancer associated pain: Continue Oxy IR 5 mg every 4 hours as needed for severe pain.   Discharge Exam: Vitals:   12/09/23 1511 12/09/23 1625  BP: 102/89 (!) 119/95  Pulse: (!) 116   Resp:    Temp:    SpO2:     General: Pt is alert, awake, not in acute distress. Cardiovascular: RRR, S1/S2 +, no rubs, no gallops. Respiratory: CTA bilaterally, no wheezing, no rhonchi. Abdominal:  Soft, NT, ND, bowel sounds +. Extremities: no edema, no cyanosis.  Discharge Instructions: Discharge Instructions     Discharge instructions   Complete by: As directed    Please call call MD or return to ER for similar  or worsening recurring problem that brought you to hospital or if any fever,nausea/vomiting,abdominal pain, uncontrolled pain, chest pain,  shortness of breath or any other alarming symptoms.  Please follow-up your doctor as instructed in a week time and call the office for appointment.  Please avoid alcohol, smoking, or any other illicit substance and maintain healthy habits including taking your regular medications as prescribed.  You were cared for by a hospitalist during your hospital stay. If you have any questions about your discharge medications or the care you received while you were in the hospital after you are discharged, you can call the unit and ask to speak with the hospitalist on call if the hospitalist that took care of you is not available.  Once you are discharged, your primary care physician will handle any further medical issues. Please note that NO REFILLS for any discharge medications will be authorized once you are discharged, as it is imperative that you return to your primary care physician (or establish a relationship with a primary care physician if you do not have one) for your aftercare needs so that they can reassess your need for medications and monitor your lab values   Increase activity slowly   Complete by: As directed       Allergies as of 12/09/2023       Reactions   Influenza Vaccines Anaphylaxis   Flu shot: 1974 anaphylaxis. 2nd time swollen arm   Influenza Virus Vaccine Other (See Comments)        Medication List     STOP taking these medications    diltiazem 30 MG tablet Commonly known as: CARDIZEM       TAKE these medications    acetaminophen 500 MG tablet Commonly known as: TYLENOL Take 1 tablet (500 mg total) by mouth every 6 (six) hours as needed for moderate pain or mild pain. What changed: when to take this   amiodarone 200 MG tablet Commonly known as: PACERONE Take 2 tabs twice daily x 7 days until 12/15/23 and from 12/16/23 take 1  tab daily   ammonium lactate 12 % lotion Commonly known as: LAC-HYDRIN Apply 1 Application topically as needed.   bisacodyl 10 MG suppository Commonly known as: DULCOLAX Place 1 suppository (10 mg total) rectally daily as needed for moderate constipation.   carbidopa-levodopa 50-200 MG tablet Commonly known as: SINEMET CR TAKE 1 TABLET AT BEDTIME   carbidopa-levodopa 25-100 MG tablet Commonly known as: SINEMET IR Take 3 tablets at 7 AM, 2 tablets at 11 AM, 3 tablets at 3 PM, 2 tablets at 7 PM   ciclopirox 8 % solution Commonly known as: PENLAC Apply 1 Application topically once a week.   clonazePAM 0.5 MG tablet Commonly known as: KLONOPIN Take 1 tablet (0.5 mg total) by mouth at bedtime.   cyanocobalamin 1000 MCG tablet Commonly known as: VITAMIN B12 Take 1,000 mcg by mouth daily.   Eliquis 5 MG Tabs tablet Generic drug: apixaban TAKE 1 TABLET TWICE A DAY   furosemide 20 MG tablet Commonly known as: LASIX Take 1 tablet (20 mg total) by mouth daily as needed for fluid or edema.   ketoconazole 2 % shampoo Commonly known as: NIZORAL Apply 1 application  topically 3 (three) times a week.  leuprolide (6 Month) 45 MG injection Commonly known as: ELIGARD Inject 45 mg into the skin every 6 (six) months.   lidocaine-prilocaine cream Commonly known as: EMLA Apply to port site 1 to 2 hours prior to appointments/treatment   metoprolol succinate 25 MG 24 hr tablet Commonly known as: TOPROL-XL Take 1 tablet (25 mg total) by mouth daily. Start taking on: December 10, 2023   midodrine 5 MG tablet Commonly known as: PROAMATINE Take 1 tablet (5 mg total) by mouth 3 (three) times daily with meals.   MiraLax 17 GM/SCOOP powder Generic drug: polyethylene glycol powder Take 17 g by mouth in the morning, at noon, and at bedtime.   omeprazole 20 MG capsule Commonly known as: PRILOSEC Take 1 capsule (20 mg total) by mouth daily.   ondansetron 8 MG tablet Commonly known as:  ZOFRAN Take 1 tablet (8 mg total) by mouth every 8 (eight) hours as needed for nausea or vomiting.   oxyCODONE 5 MG immediate release tablet Commonly known as: Oxy IR/ROXICODONE Take 1 tablet (5 mg total) by mouth every 4 (four) hours as needed for severe pain (pain score 7-10).   Pancreaze 37000-97300 units Cpep Generic drug: Pancrelipase (Lip-Prot-Amyl) TAKE 2 CAPSULES WITH BREAKFAST, LUNCH, AND EVENING MEAL (WITH EACH MEAL) AND 1 CAPSULE WITH SNACK   prochlorperazine 10 MG tablet Commonly known as: COMPAZINE Take 1 tablet (10 mg total) by mouth every 6 (six) hours as needed for nausea or vomiting.   rosuvastatin 10 MG tablet Commonly known as: CRESTOR TAKE 1 TABLET DAILY   tiZANidine 2 MG tablet Commonly known as: ZANAFLEX Take 1 tablet (2 mg total) by mouth every 8 (eight) hours as needed for muscle spasms.   triamcinolone cream 0.1 % Commonly known as: KENALOG Apply 1 application topically 2 (two) times daily. What changed:  when to take this reasons to take this   VISINE DRY EYE OP Place 1 drop into both eyes 2 (two) times daily as needed (eye irritation).   zoledronic acid 5 MG/100ML Soln injection Commonly known as: RECLAST Inject 5 mg into the vein See admin instructions. Once a year        Follow-up Information     [facility] Follow [facility].   Why: [facility] will provide PT and OT at [facility] after discharge.               Allergies  Allergen Reactions   Influenza Vaccines Anaphylaxis    Flu shot: 1974 anaphylaxis. 2nd time swollen arm   Influenza Virus Vaccine Other (See Comments)   The results of significant diagnostics from this hospitalization (including imaging, microbiology, ancillary and laboratory) are listed below for reference.    Microbiology: No results found for this or any previous visit (from the past 240 hours).  Procedures/Studies: ECHOCARDIOGRAM COMPLETE Result Date: 12/08/2023    ECHOCARDIOGRAM REPORT   Patient Name:   PAYAM GRIBBLE Date of Exam: 12/08/2023 Medical Rec #:  [id number]        Height:       64.0 in Accession #:    [id number]       Weight:       138.7 lb Date of Birth:  26-Jun-1941       BSA:          1.674 m Patient Age:    82 years         BP:           91/74 mmHg Patient Gender: M  HR:           125 bpm. Exam Location:  Inpatient Procedure: 2D Echo, Cardiac Doppler and Color Doppler (Both Spectral and Color            Flow Doppler were utilized during procedure). Indications:    CHF  History:        Patient has prior history of Echocardiogram examinations, most                 recent 04/30/2021. Arrythmias:Atrial Fibrillation.  Sonographer:    Amy Chionchio Referring Phys: [id number] VISHAL R PATEL IMPRESSIONS  1. Left ventricular ejection fraction, by estimation, is 35 to 40%. The left ventricle has moderately decreased function. The left ventricle demonstrates global hypokinesis. There is moderate left ventricular hypertrophy. Left ventricular diastolic parameters are indeterminate.  2. Right ventricular systolic function is mildly reduced. The right ventricular size is mildly enlarged.  3. Left atrial size was severely dilated.  4. Right atrial size was severely dilated.  5. The mitral valve is normal in structure. Mild mitral valve regurgitation.  6. The aortic valve is tricuspid. Aortic valve regurgitation is mild. No aortic stenosis is present. FINDINGS  Left Ventricle: Left ventricular ejection fraction, by estimation, is 35 to 40%. The left ventricle has moderately decreased function. The left ventricle demonstrates global hypokinesis. The left ventricular internal cavity size was normal in size. There is moderate left ventricular hypertrophy. Left ventricular diastolic parameters are indeterminate. Right Ventricle: The right ventricular size is mildly enlarged. No increase in right ventricular wall thickness. Right ventricular systolic function is mildly reduced. Left Atrium: Left atrial size was  severely dilated. Right Atrium: Right atrial size was severely dilated. Pericardium: Trivial pericardial effusion is present. Mitral Valve: The mitral valve is normal in structure. Mild mitral valve regurgitation. MV peak gradient, 2.1 mmHg. The mean mitral valve gradient is 1.0 mmHg. Tricuspid Valve: The tricuspid valve is normal in structure. Tricuspid valve regurgitation is mild. Aortic Valve: The aortic valve is tricuspid. Aortic valve regurgitation is mild. Aortic regurgitation PHT measures 751 msec. No aortic stenosis is present. Aortic valve mean gradient measures 2.0 mmHg. Aortic valve peak gradient measures 3.1 mmHg. Aortic  valve area, by VTI measures 3.36 cm. Pulmonic Valve: The pulmonic valve was not well visualized. Pulmonic valve regurgitation is not visualized. Aorta: The aortic root is normal in size and structure. IAS/Shunts: The interatrial septum was not well visualized.  LEFT VENTRICLE PLAX 2D LVIDd:         4.50 cm LVIDs:         4.30 cm LV PW:         1.30 cm LV IVS:        1.30 cm LVOT diam:     2.30 cm LV SV:         42 LV SV Index:   25 LVOT Area:     4.15 cm  LV Volumes (MOD) LV vol d, MOD A4C: 96.5 ml LV vol s, MOD A4C: 60.1 ml LV SV MOD A4C:     96.5 ml RIGHT VENTRICLE RV Basal diam:  4.00 cm RV Mid diam:    3.80 cm TAPSE (M-mode): 2.0 cm LEFT ATRIUM              Index        RIGHT ATRIUM           Index LA Vol (A2C):   70.5 ml  42.11 ml/m  RA Area:     25.40  cm LA Vol (A4C):   126.0 ml 75.25 ml/m  RA Volume:   82.10 ml  49.03 ml/m LA Biplane Vol: 97.6 ml  58.29 ml/m  AORTIC VALVE                    PULMONIC VALVE AV Area (Vmax):    3.57 cm     PV Vmax:          1.20 m/s AV Area (Vmean):   3.02 cm     PV Peak grad:     5.8 mmHg AV Area (VTI):     3.36 cm     PR End Diast Vel: 8.64 msec AV Vmax:           88.30 cm/s AV Vmean:          70.300 cm/s AV VTI:            0.125 m AV Peak Grad:      3.1 mmHg AV Mean Grad:      2.0 mmHg LVOT Vmax:         75.80 cm/s LVOT Vmean:         51.100 cm/s LVOT VTI:          0.101 m LVOT/AV VTI ratio: 0.81 AI PHT:            751 msec  AORTA Ao Root diam: 3.90 cm Ao Asc diam:  3.80 cm MITRAL VALVE              TRICUSPID VALVE MV Area (PHT): 3.67 cm   TR Peak grad:   21.0 mmHg MV Area VTI:   5.42 cm   TR Vmax:        229.00 cm/s MV Peak grad:  2.1 mmHg MV Mean grad:  1.0 mmHg   SHUNTS MV Vmax:       0.72 m/s   Systemic VTI:  0.10 m MV Vmean:      37.7 cm/s  Systemic Diam: 2.30 cm Epifanio Lesches MD Electronically signed by Epifanio Lesches MD Signature Date/Time: 12/08/2023/1:22:28 PM    Final    CT Angio Chest PE W and/or Wo Contrast Result Date: 12/07/2023 CLINICAL DATA:  Concern for pulmonary embolism. Abdominal pain and diarrhea. Pancreatic cancer undergoing therapy. * Tracking Code: BO * EXAM: CT ANGIOGRAPHY CHEST CT ABDOMEN AND PELVIS WITH CONTRAST TECHNIQUE: Multidetector CT imaging of the chest was performed using the standard protocol during bolus administration of intravenous contrast. Multiplanar CT image reconstructions and MIPs were obtained to evaluate the vascular anatomy. Multidetector CT imaging of the abdomen and pelvis was performed using the standard protocol during bolus administration of intravenous contrast. RADIATION DOSE REDUCTION: This exam was performed according to the departmental dose-optimization program which includes automated exposure control, adjustment of the mA and/or kV according to patient size and/or use of iterative reconstruction technique. CONTRAST:  85mL OMNIPAQUE IOHEXOL 350 MG/ML SOLN COMPARISON:  10/01/2023 FINDINGS: CTA CHEST FINDINGS Cardiovascular: No filling defects within the pulmonary arteries to suggest acute pulmonary embolism. Mediastinum/Nodes: No axillary or supraclavicular adenopathy. No mediastinal or hilar adenopathy. No pericardial fluid. Esophagus normal. Port in the anterior chest wall with tip in distal SVC. Lungs/Pleura: Bilateral pleural effusions increased from prior. Interstitial  thickening suggesting interstitial edema is new from prior. Nodular density in the RIGHT lower lobe measuring 13 mm (image 71/4) compared to 12 mm on prior. Nodule in the lateral aspect RIGHT middle lobe measuring 14 mm is unchanged. Musculoskeletal: Review of the MIP images  confirms the above findings. CT ABDOMEN and PELVIS FINDINGS Hepatobiliary: Cyst in the superior central LEFT hepatic lobe is unchanged. No duct dilatation evident. Periportal edema is present. Gallbladder is nondistended. Common bile duct nondistended. Pancreas: Severe dilatation of the pancreatic duct in the body and tail. No change from comparison exam. Calcification in the head of the pancreas. Dilatation extends of 2 18 mm in the mid pancreas not changed from prior. Spleen: Normal spleen Adrenals/urinary tract: Solitary RIGHT kidney. Ureter and bladder normal. Stomach/Bowel: Stomach, small bowel, appendix, and cecum are normal. Moderate volume stool throughout the colon. Multiple diverticula of the descending colon and sigmoid colon without acute inflammation. Vascular/Lymphatic: Abdominal aorta is normal caliber with atherosclerotic calcification. There is no retroperitoneal or periportal lymphadenopathy. No pelvic lymphadenopathy. Reproductive: Unremarkable Other: No omental peritoneal metastasis Musculoskeletal: No aggressive osseous lesion. Stable sclerotic lesion in the inferior LEFT ischium. Post RIGHT hip arthroplasty Review of the MIP images confirms the above findings. IMPRESSION: CHEST: 1. No evidence acute pulmonary embolism. 2. Increased bilateral pleural effusions and interstitial edema. 3. Stable pulmonary nodules in the RIGHT middle lobe and RIGHT lower lobe. PELVIS: 1. No change in severe duct dilatation of the pancreas. No biliary duct dilatation. Pancreatic mass is difficult to define CT imaging 2. No evidence of metastatic disease in the abdomen pelvis. 3. Solitary RIGHT kidney appears normal Electronically Signed   By:  Genevive Bi M.D.   On: 12/07/2023 15:49   CT ABDOMEN PELVIS W CONTRAST Result Date: 12/07/2023 CLINICAL DATA:  Concern for pulmonary embolism. Abdominal pain and diarrhea. Pancreatic cancer undergoing therapy. * Tracking Code: BO * EXAM: CT ANGIOGRAPHY CHEST CT ABDOMEN AND PELVIS WITH CONTRAST TECHNIQUE: Multidetector CT imaging of the chest was performed using the standard protocol during bolus administration of intravenous contrast. Multiplanar CT image reconstructions and MIPs were obtained to evaluate the vascular anatomy. Multidetector CT imaging of the abdomen and pelvis was performed using the standard protocol during bolus administration of intravenous contrast. RADIATION DOSE REDUCTION: This exam was performed according to the departmental dose-optimization program which includes automated exposure control, adjustment of the mA and/or kV according to patient size and/or use of iterative reconstruction technique. CONTRAST:  85mL OMNIPAQUE IOHEXOL 350 MG/ML SOLN COMPARISON:  10/01/2023 FINDINGS: CTA CHEST FINDINGS Cardiovascular: No filling defects within the pulmonary arteries to suggest acute pulmonary embolism. Mediastinum/Nodes: No axillary or supraclavicular adenopathy. No mediastinal or hilar adenopathy. No pericardial fluid. Esophagus normal. Port in the anterior chest wall with tip in distal SVC. Lungs/Pleura: Bilateral pleural effusions increased from prior. Interstitial thickening suggesting interstitial edema is new from prior. Nodular density in the RIGHT lower lobe measuring 13 mm (image 71/4) compared to 12 mm on prior. Nodule in the lateral aspect RIGHT middle lobe measuring 14 mm is unchanged. Musculoskeletal: Review of the MIP images confirms the above findings. CT ABDOMEN and PELVIS FINDINGS Hepatobiliary: Cyst in the superior central LEFT hepatic lobe is unchanged. No duct dilatation evident. Periportal edema is present. Gallbladder is nondistended. Common bile duct nondistended.  Pancreas: Severe dilatation of the pancreatic duct in the body and tail. No change from comparison exam. Calcification in the head of the pancreas. Dilatation extends of 2 18 mm in the mid pancreas not changed from prior. Spleen: Normal spleen Adrenals/urinary tract: Solitary RIGHT kidney. Ureter and bladder normal. Stomach/Bowel: Stomach, small bowel, appendix, and cecum are normal. Moderate volume stool throughout the colon. Multiple diverticula of the descending colon and sigmoid colon without acute inflammation. Vascular/Lymphatic: Abdominal aorta is normal caliber  with atherosclerotic calcification. There is no retroperitoneal or periportal lymphadenopathy. No pelvic lymphadenopathy. Reproductive: Unremarkable Other: No omental peritoneal metastasis Musculoskeletal: No aggressive osseous lesion. Stable sclerotic lesion in the inferior LEFT ischium. Post RIGHT hip arthroplasty Review of the MIP images confirms the above findings. IMPRESSION: CHEST: 1. No evidence acute pulmonary embolism. 2. Increased bilateral pleural effusions and interstitial edema. 3. Stable pulmonary nodules in the RIGHT middle lobe and RIGHT lower lobe. PELVIS: 1. No change in severe duct dilatation of the pancreas. No biliary duct dilatation. Pancreatic mass is difficult to define CT imaging 2. No evidence of metastatic disease in the abdomen pelvis. 3. Solitary RIGHT kidney appears normal Electronically Signed   By: Genevive Bi M.D.   On: 12/07/2023 15:49   DG SWALLOW FUNC OP MEDICARE SPEECH PATH Result Date: 11/10/2023 Table formatting from the original result was not included. Modified Barium Swallow Study Patient Details Name: GURKARAN RAHM MRN: [id number] Date of Birth: March 27, 1941 Today's Date: 11/10/2023 HPI/PMH: HPI: 83 yo male with h/o Parkinson's disease, prostate cancer with bone mets, femur fx, RBD, long term use of anticoagulants, lung nodules, infection due to port-a-cath, pancreatic cancer, dysrhythima, lumber  fracture, sleep apnea, bronchial biopsy, BPH, diverticulosis, Atrial flutter.  PSH includes kidney donation, hip surgery, portacath placement. He has  dysphagia to solids more than liquids but reports occasional coughing with liquids.  Pt states he has lost weight due to his pancreatic cancer.  He denies requiring heimlich manuever nor having recurrent pneumonias.  He takes his medications with smooth food to get them down.  Admits to worsening dysphagia over the last 4-6 months and reports voice is weaker. He suspects his dysphagia is due to Parkinson's and deconditioning with direct question cue. Clinical Impression: Clinical Impression: Patient presents with moderate pharyngeal phase dysphagia with sensorimotor deficits.  He demonstrates impaired tongue base retraction, pharyngeal motility and laryngeal elevation/closure resulting in retention *more vallecular than pyriform sinus* and laryngeal penetration *trace aspiratio of thin liquids with sequential swallows.  Chin tuck posture nor cued effortful swallow was helpful to prevent laryngeal penetration nor aid pharyngeal clearance.  Following solids with liquids and dry swallows helpful however.  Trace silent aspiration noted with sequential swallows.  Barium tablet taken with pudding lodged in vallecular space without pt sensation. Pudding bolus with chin tuck and effortful swallow finally transited tablet into esophagus.  In  A-P view, pharyngeal retention was bilateral.  Recommend continue diet with precautions including starting intake with liquids, following solids with liquids, and multiple swallows with boluses. Also advise occassional strong cough and expectoration, especially in light of lack of sensation to retention and mild aspiration.  Also highly advise follow up with SLP to prescribe dysphagia exercises to strengthen pt's motlity and cough response for maximal swallow efficiency and airway protection.  DIGEST scoring of pt's swallow included  efficiency grade 3 (50-90% retained) which is within severe range and safety grade 2 (minimal intermittent aspiration= moderate.  Using teach back, including review of MBS flouroscopy loops, pt educated to recommendations.  Thanks for this consult. Factors that may increase risk of adverse event in presence of aspiration Rubye Oaks & Clearance Coots 2021): Factors that may increase risk of adverse event in presence of aspiration Rubye Oaks & Clearance Coots 2021): Limited mobility; Frail or deconditioned; Reduced saliva; Poor general health and/or compromised immunity; Weak cough Recommendations/Plan: Swallowing Evaluation Recommendations Swallowing Evaluation Recommendations Recommendations: PO diet PO Diet Recommendation: Regular; Dysphagia 3 (Mechanical soft); Thin liquids (Level 0) Liquid Administration via: Cup; Straw Medication Administration: Whole  meds with puree Supervision: Patient able to self-feed Swallowing strategies  : Small bites/sips; Slow rate; Multiple dry swallows after each bite/sip; Follow solids with liquids Postural changes: Position pt fully upright for meals; Stay upright 30-60 min after meals Oral care recommendations: Oral care QID (4x/day) Treatment Plan Treatment Plan Follow-up recommendations: Outpatient SLP Functional status assessment: Patient has had a recent decline in their functional status and demonstrates the ability to make significant improvements in function in a reasonable and predictable amount of time. Recommendations Recommendations for follow up therapy are one component of a multi-disciplinary discharge planning process, led by the attending physician.  Recommendations may be updated based on patient status, additional functional criteria and insurance authorization. Assessment: Orofacial Exam: Orofacial Exam Oral Cavity: Oral Hygiene: WFL Oral Cavity - Dentition: Adequate natural dentition Orofacial Anatomy: WFL Oral Motor/Sensory Function: Generalized oral weakness Anatomy: Anatomy: WFL  Boluses Administered: Boluses Administered Boluses Administered: Thin liquids (Level 0); Mildly thick liquids (Level 2, nectar thick); Moderately thick liquids (Level 3, honey thick); Puree; Solid  Oral Impairment Domain: Oral Impairment Domain Lip Closure: No labial escape Tongue control during bolus hold: Cohesive bolus between tongue to palatal seal Bolus preparation/mastication: Timely and efficient chewing and mashing Bolus transport/lingual motion: Brisk tongue motion Oral residue: Trace residue lining oral structures Location of oral residue : Tongue Initiation of pharyngeal swallow : Valleculae; Pyriform sinuses  Pharyngeal Impairment Domain: Pharyngeal Impairment Domain Soft palate elevation: No bolus between soft palate (SP)/pharyngeal wall (PW) Laryngeal elevation: Partial superior movement of thyroid cartilage/partial approximation of arytenoids to epiglottic petiole Anterior hyoid excursion: Partial anterior movement Epiglottic movement: Partial inversion Laryngeal vestibule closure: Incomplete, narrow column air/contrast in laryngeal vestibule Pharyngeal stripping wave : Present - diminished Pharyngeal contraction (A/P view only): -- (bilateral clearance) Pharyngoesophageal segment opening: Partial distention/partial duration, partial obstruction of flow Tongue base retraction: Wide column of contrast or air between tongue base and PPW Pharyngeal residue: Collection of residue within or on pharyngeal structures Location of pharyngeal residue: Valleculae; Pyriform sinuses  Esophageal Impairment Domain: Esophageal Impairment Domain Esophageal clearance upright position: Esophageal retention Pill: Pill Consistency administered: Puree Puree: Impaired (see clinical impressions) Penetration/Aspiration Scale Score: Penetration/Aspiration Scale Score 1.  Material does not enter airway: Mildly thick liquids (Level 2, nectar thick); Moderately thick liquids (Level 3, honey thick); Puree; Solid; Pill 8.  Material  enters airway, passes BELOW cords without attempt by patient to eject out (silent aspiration) : Thin liquids (Level 0) Compensatory Strategies: Compensatory Strategies Compensatory strategies: Yes Effortful swallow: Ineffective Ineffective Effortful Swallow: Puree; Solid Multiple swallows: Effective Effective Multiple Swallows: Mildly thick liquid (Level 2, nectar thick); Thin liquid (Level 0) Chin tuck: Ineffective Ineffective Chin Tuck: Thin liquid (Level 0)   General Information: Caregiver present: No  Diet Prior to this Study: Regular; Thin liquids (Level 0); Dysphagia 3 (mechanical soft)   Temperature : Normal   Respiratory Status: WFL   Supplemental O2: None (Room air)   History of Recent Intubation: No  Behavior/Cognition: Alert; Cooperative; Pleasant mood Self-Feeding Abilities: Able to self-feed Baseline vocal quality/speech: Hypophonia/low volume Volitional Cough: Able to elicit Volitional Swallow: Able to elicit Exam Limitations: No limitations Goal Planning: No data recorded No data recorded No data recorded No data recorded No data recorded Pain: Pain Assessment Pain Assessment: No/denies pain End of Session: Start Time:SLP Start Time (ACUTE ONLY): 1309 Stop Time: SLP Stop Time (ACUTE ONLY): 1350 Time Calculation:SLP Time Calculation (min) (ACUTE ONLY): 41 min Charges: SLP Evaluations $ SLP Speech Visit: 1 Visit SLP Evaluations $  Outpatient MBS Swallow: 1 Procedure $Swallowing Treatment: 1 Procedure SLP visit diagnosis: SLP Visit Diagnosis: Dysphagia, pharyngeal phase (R13.13); Dysphagia, pharyngoesophageal phase (R13.14) Past Medical History: Past Medical History: Diagnosis Date  Atrial flutter (HCC)   BPH (benign prostatic hyperplasia)   Cancer (HCC)   PROSTATE  Diverticulosis 2015  Dysrhythmia   Parkinson's disease (HCC)   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)   Prostate cancer metastatic to bone (HCC)   REM sleep behavior disorder   Sleep apnea   BiPap - uses  nightly Past Surgical History: Past Surgical History: Procedure Laterality Date  ANTERIOR APPROACH HEMI HIP ARTHROPLASTY Right 02/05/2023  Procedure: ANTERIOR APPROACH HEMI HIP ARTHROPLASTY;  Surgeon: Jodi Geralds, MD;  Location: [facility] OR;  Service: Orthopedics;  Laterality: Right;  APPENDECTOMY  1963  BIOPSY  12/25/2021  Procedure: BIOPSY;  Surgeon: Meridee Score Netty Starring., MD;  Location: [facility] ENDOSCOPY;  Service: Gastroenterology;;  BRONCHIAL BIOPSY  07/05/2023  Procedure: BRONCHIAL BIOPSIES;  Surgeon: Josephine Igo, DO;  Location: [facility] ENDOSCOPY;  Service: Pulmonary;;  COLONOSCOPY  2010, 2015  ESOPHAGOGASTRODUODENOSCOPY N/A 04/16/2022  Procedure: ESOPHAGOGASTRODUODENOSCOPY (EGD);  Surgeon: Lemar Lofty., MD;  Location: [facility] ENDOSCOPY;  Service: Gastroenterology;  Laterality: N/A;  ESOPHAGOGASTRODUODENOSCOPY (EGD) WITH PROPOFOL N/A 12/25/2021  Procedure: ESOPHAGOGASTRODUODENOSCOPY (EGD) WITH PROPOFOL;  Surgeon: Meridee Score Netty Starring., MD;  Location: [facility] ENDOSCOPY;  Service: Gastroenterology;  Laterality: N/A;  EUS N/A 12/25/2021  Procedure: UPPER ENDOSCOPIC ULTRASOUND (EUS) RADIAL;  Surgeon: Lemar Lofty., MD;  Location: [facility] ENDOSCOPY;  Service: Gastroenterology;  Laterality: N/A;  EUS N/A 04/16/2022  Procedure: UPPER ENDOSCOPIC ULTRASOUND (EUS) RADIAL;  Surgeon: Lemar Lofty., MD;  Location: [facility] ENDOSCOPY;  Service: Gastroenterology;  Laterality: N/A;  FIDUCIAL MARKER PLACEMENT N/A 04/16/2022  Procedure: FIDUCIAL MARKER PLACEMENT;  Surgeon: Lemar Lofty., MD;  Location: [facility] ENDOSCOPY;  Service: Gastroenterology;  Laterality: N/A;  FINE NEEDLE ASPIRATION  12/25/2021  Procedure: FINE NEEDLE ASPIRATION (FNA) LINEAR;  Surgeon: Lemar Lofty., MD;  Location: [facility] ENDOSCOPY;  Service: Gastroenterology;;  IR IMAGING GUIDED PORT INSERTION  07/28/2023  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  KYPHOPLASTY N/A 08/15/2020  Procedure: L2 compression fracture;  Surgeon: Kennedy Bucker, MD;  Location: [facility] ORS;   Service: Orthopedics;  Laterality: N/A;  KYPHOPLASTY N/A 08/29/2020  Procedure: T8 KYPHOPLASTY;  Surgeon: Kennedy Bucker, MD;  Location: [facility] ORS;  Service: Orthopedics;  Laterality: N/A;  KYPHOPLASTY N/A 11/12/2020  Procedure: L1 KYPHOPLASTY;  Surgeon: Kennedy Bucker, MD;  Location: [facility] ORS;  Service: Orthopedics;  Laterality: N/A;  PORTACATH PLACEMENT N/A 02/05/2022  Procedure: INSERTION PORT-A-CATH;  Surgeon: Fritzi Mandes, MD;  Location: [facility] ORS;  Service: General;  Laterality: N/A; Rolena Infante, MS [facility] SLP Acute Rehab Services Office [phone] Chales Abrahams 11/10/2023, 3:17 PM CLINICAL DATA:  Dysphagia. Cough/GE reflux disease/other secondary diagnosis EXAM: MODIFIED BARIUM SWALLOW TECHNIQUE: Different consistencies of barium were administered orally to the patient by the Speech Pathologist. Imaging of the pharynx was performed in the lateral projection. Radiologist in attendance for the exam. Different consistencies of barium were administered orally to the patient by the Speech Pathologist. Imaging of the pharynx was performed in the lateral projection. The radiologist was present in the fluoroscopy room for this study, providing personal supervision. FLUOROSCOPY TIME:  Radiation Exposure Index (as provided by the fluoroscopic device): 2 minutes 24 seconds 176.88 micro gray meter squared COMPARISON:  None Available. FINDINGS: Modified barium swallow was performed by the speech pathologist. Please refer to the Speech Pathology report for results and recommendations. Penetration and  occasional small volume aspiration observed with thin liquid consistency. Pooling in the vallecula with most consistencies. Barium tablet arrest at the vallecula until multiple swallows move it into the esophagus. IMPRESSION: Please refer to the Speech Pathologists report for complete details and recommendations. Electronically Signed   By: Paulina Fusi M.D.   On: 11/10/2023 13:57  Labs: BNP (last 3 results) Recent Labs     12/07/23 1219  BNP 778.8*   Basic Metabolic Panel: Recent Labs  Lab 12/07/23 1219 12/08/23 0458 12/09/23 0328  NA 136 137 138  K 4.1 3.9 3.5  CL 106 109 105  CO2 24 22 23   GLUCOSE 167* 92 110*  BUN 25* 19 18  CREATININE 0.84 0.71 0.88  CALCIUM 8.7* 8.6* 8.9  MG  --  2.3  --    Liver Function Tests: Recent Labs  Lab 12/07/23 1219  AST 38  ALT 6  ALKPHOS 72  BILITOT 0.9  PROT 5.9*  ALBUMIN 3.3*   No results for input(s): "LIPASE", "AMYLASE" in the last 168 hours. No results for input(s): "AMMONIA" in the last 168 hours. CBC: Recent Labs  Lab 12/07/23 1219 12/08/23 0458 12/09/23 0328  WBC 4.5 4.4 3.4*  NEUTROABS 3.6  --   --   HGB 8.3* 8.9* 10.1*  HCT 26.4* 28.9* 32.1*  MCV 98.5 102.1* 98.5  PLT 246 248 291   Thyroid function studies Recent Labs    12/09/23 0328  TSH 2.649  Anemia work up Recent Labs    12/08/23 0458  VITAMINB12 1,160*  FOLATE 18.0  FERRITIN 234  TIBC 309  IRON 58  Urinalysis    Component Value Date/Time   COLORURINE YELLOW 07/31/2023 1422   APPEARANCEUR CLEAR 07/31/2023 1422   APPEARANCEUR Clear 03/20/2019 1412   LABSPEC 1.013 07/31/2023 1422   PHURINE 6.5 07/31/2023 1422   GLUCOSEU NEGATIVE 07/31/2023 1422   HGBUR NEGATIVE 07/31/2023 1422   BILIRUBINUR NEGATIVE 07/31/2023 1422   BILIRUBINUR Negative 03/20/2019 1412   KETONESUR NEGATIVE 07/31/2023 1422   PROTEINUR NEGATIVE 07/31/2023 1422   NITRITE NEGATIVE 07/31/2023 1422   LEUKOCYTESUR NEGATIVE 07/31/2023 1422   Sepsis Labs Recent Labs  Lab 12/07/23 1219 12/08/23 0458 12/09/23 0328  WBC 4.5 4.4 3.4*   Microbiology No results found for this or any previous visit (from the past 240 hours).  Time coordinating discharge: 35 minutes SIGNED: Lanae Boast, MD  [facility] 12/09/2023, 5:28 PM  If 7PM-7AM, please contact night-coverage [email or web address]

## 2023-12-09 NOTE — Progress Notes (Signed)
 Pt IV deaccessed and belongings returned: 4 wheel walker, clothes, cell phone, and  meds delivered from [facility] pharmacy. Taken to front entrance in wheelchair.  Transportation provided by son.

## 2023-12-09 NOTE — Evaluation (Signed)
 Occupational Therapy Evaluation Patient Details Name: LAURIE LOVEJOY MRN: [id number] DOB: 02/01/1941 Today's Date: 12/09/2023   History of Present Illness   DELMON ANDRADA is a 83 yr old male admitted to the dizziness, hypotension, and weakness. He was found to have pulmonary edema with bilateral pleural effusion and acute on chronic heart failure. PMH: chronic atrial fibrillation/flutter, pancreatic cancer on gemcitabine monotherapy, prostate cancer metastatic to bone, Parkinson's disease, anemia of chronic disease, HLD, OSA    Clinical Impressions The pt is currently presenting slightly below his baseline level of functioning for self-care management, as he is limited by the below listed deficits (see OT problem list). At current, he requires SBA to CGA for tasks, including lower body dressing, sit to stand, and simulated toileting. He was noted to be with mild deconditioning and generalized strength deficits. He will benefit from further OT services to maximize his independence with self-care tasks and to decrease the risk for restricted participation in meaningful activities. OT recommends the pt return to the ILF with resumption of therapy services there.      If plan is discharge home, recommend the following:   Assist for transportation;Assistance with cooking/housework     Functional Status Assessment   Patient has had a recent decline in their functional status and demonstrates the ability to make significant improvements in function in a reasonable and predictable amount of time.     Equipment Recommendations   Other (comment) (none recommended)     Recommendations for Other Services         Precautions/Restrictions   Precautions Precautions: Fall Restrictions Weight Bearing Restrictions Per Provider Order: No     Mobility Bed Mobility Overal bed mobility: Needs Assistance Bed Mobility: Supine to Sit, Sit to Supine     Supine to sit: Supervision Sit  to supine: Supervision        Transfers Overall transfer level: Needs assistance Equipment used: Rollator (4 wheels) Transfers: Sit to/from Stand Sit to Stand: Supervision                  Balance     Sitting balance-Leahy Scale: Good       Standing balance-Leahy Scale: Fair                             ADL either performed or assessed with clinical judgement   ADL Overall ADL's : Needs assistance/impaired Eating/Feeding: Independent;Sitting   Grooming: Set up;Sitting           Upper Body Dressing : Set up;Sitting   Lower Body Dressing: Contact guard assist;Sit to/from stand       Toileting- Clothing Manipulation and Hygiene: Contact guard assist;Sit to/from stand               Vision Baseline Vision/History: 1 Wears glasses Additional Comments: He correctly read the time depicted on the wall clock.     Perception         Praxis         Pertinent Vitals/Pain Pain Assessment Pain Assessment: No/denies pain     Extremity/Trunk Assessment Upper Extremity Assessment Upper Extremity Assessment: Overall WFL for tasks assessed;Right hand dominant (B UE AROM WFL. B UE grip strength 4/5)   Lower Extremity Assessment Lower Extremity Assessment: Generalized weakness (B LE AROM WFL)       Communication Communication Communication: No apparent difficulties   Cognition Arousal: Alert Behavior During Therapy: WFL for tasks assessed/performed  OT - Cognition Comments: Oriented x4, able to follow 1-2 step commands without difficulty                 Following commands: Intact                  Home Living Family/patient expects to be discharged to:: Other (Comment) ([facility], [facility]) Living Arrangements: Alone          Home Equipment: Rollator (4 wheels);Shower Counsellor (2 wheels)          Prior Functioning/Environment Prior Level of Function : Independent/Modified Independent              Mobility Comments:  (He used a rollator for ambulation and has been receiving OP therapy at the facility.) ADLs Comments:  (He was modified independent to independent with ADLs, except for requiring supervision for bathing/showering. He performed very light food prep. He had hired assist a couple days for week for cleaning and for running errands. He does not drive.)    OT Problem List: Decreased strength;Impaired balance (sitting and/or standing)   OT Treatment/Interventions: Self-care/ADL training;Therapeutic exercise;Energy conservation;DME and/or AE instruction;Patient/family education;Balance training      OT Goals(Current goals can be found in the care plan section)   Acute Rehab OT Goals Patient Stated Goal: to get stronger OT Goal Formulation: With patient Time For Goal Achievement: 12/23/23 Potential to Achieve Goals: Good ADL Goals Pt Will Perform Grooming: with modified independence;standing Pt Will Perform Lower Body Dressing: with modified independence;sitting/lateral leans;sit to/from stand Pt Will Transfer to Toilet: with modified independence;with max assist Pt Will Perform Toileting - Clothing Manipulation and hygiene: with modified independence;sit to/from stand   OT Frequency:  Min 1X/week       AM-PAC OT "6 Clicks" Daily Activity     Outcome Measure Help from another person eating meals?: None Help from another person taking care of personal grooming?: None Help from another person toileting, which includes using toliet, bedpan, or urinal?: A Little Help from another person bathing (including washing, rinsing, drying)?: A Little Help from another person to put on and taking off regular upper body clothing?: A Little Help from another person to put on and taking off regular lower body clothing?: A Little 6 Click Score: 20   End of Session Equipment Utilized During Treatment: Rollator (4 wheels) Nurse Communication: Mobility status  Activity  Tolerance: Patient tolerated treatment well Patient left: in bed;with call bell/phone within reach;with bed alarm set;with family/visitor present  OT Visit Diagnosis: Muscle weakness (generalized) (M62.81);Unsteadiness on feet (R26.81)                Time: 1610-9604 OT Time Calculation (min): 15 min Charges:  OT General Charges $OT Visit: 1 Visit OT Evaluation $OT Eval Moderate Complexity: 1 Mod    Abbygale Lapid L Kiasia Chou, OTR/L 12/09/2023, 4:37 PM

## 2023-12-09 NOTE — Progress Notes (Signed)
 Patient Name: [name] [name] Date of Encounter: 12/09/2023 [facility] Cardiologist: Tessa Lerner, DO   Interval Summary  .    Feels better, feels low energy only because he is getting less therapy than usual.   Vital Signs .    Vitals:   12/09/23 0343 12/09/23 0344 12/09/23 0811 12/09/23 1232  BP: (!) 132/104  109/73 (!) 108/94  Pulse: 95  84 69  Resp: 20   16  Temp: 98.1 F (36.7 C)   (!) 97.5 F (36.4 C)  TempSrc: Oral   Oral  SpO2: 100%   99%  Weight:  60.3 kg    Height:        Intake/Output Summary (Last 24 hours) at 12/09/2023 1349 Last data filed at 12/09/2023 0900 Gross per 24 hour  Intake --  Output 2250 ml  Net -2250 ml      12/09/2023    3:44 AM 12/08/2023    4:33 AM 12/07/2023   11:07 AM  Last 3 Weights  Weight (lbs) 132 lb 15 oz 138 lb 10.7 oz 140 lb  Weight (kg) 60.3 kg 62.9 kg 63.504 kg      Telemetry/ECG    Afib rates 110-120 - Personally Reviewed  Physical Exam .   GEN: No acute distress, thin Neck: No JVD Cardiac: iRRR, no murmurs, rubs, or gallops.  Respiratory: Clear to auscultation bilaterally. GI: Soft, nontender, non-distended  MS: No edema  Assessment & Plan .     Persistent Atrial Fibrillation/ Flutter Patient has a history of persistent atrial fibrillation/ flutter. Monitor in 12/2022 showed 7% atrial fibrillation/ flutter burden. He underwent DCCV in 04/2023 after presenting to the ED in atrial fibrillation. Looks like he has been back in atrial flutter since 07/2023 based off EKGs in the system. He now presents with hypotension. EKG and telemetry show atypical atrial flutter vs atrial fibrillation with RVR. Echo shows LVEF of 35-40%.  - Currently in atypical atrial flutter vs atrial fibrillation  - Will stop home Diltiazem due to reduced EF and hypotension. - Will start low dose Toprol-XL 12.5mg  daily >increase to 25 mg daily for better rate control. Does not appear to be in low output state. - Will load with PO Amiodarone  400mg  twice daily x1 week and then decrease to 200mg  daily. Per Epocrates, Amiodarone does not cause any major drug interactions with his chemo. - Continue chronic anticoagulation with Eliquis 5mg  twice daily. - Can plan for outpatient DCCV after Amiodarone load.   Acute HFrEF BNP elevated at 778. Chest CTA was negative for PE but did show increased bilateral pleural effusion and interstitial edema. Echo showed  LVEF of 35-40% with global hypokinesis and moderate LVH, mildly enlarged RV with mildly reduced RV function, severe biatrial enlargement, and mild MR. He was started on IV Lasix with good response, now tx to oral lasix 20 mg daily can receive dose today in hospital and then tx to lasix 20 mg po PRN at home. - Does not appear significantly volume overload on exam.  - Will discuss transitioning to PO Lasix with MD. - Will start low dose Toprol-XL 12.5mg  daily. > increase as above - Will hold off on any other GDMT at this time given hypotension requiring Midodrine. 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 The chemotherapy agents patient is on (Abraxane and Gemcitabine) usually do not cause cardiomyopathy. Recommend repeat Echo after restoration of sinus rhythm to reassess LV function.   Hypotension Patient has a history of hypotension. He  presented with systolic BP in the 60s. No obvious signs of infection. Lactic and WBC normal. 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  He received one bolus of IV fluids and was started on Midodrine. - BP improved.  -  Toprol-XL as above for rate control. - Continue Midodrine 5mg  twice daily.   Otherwise, per primary team: - Parkinson's disease - Pancreatic cancer - Obstructive sleep apnea For questions or updates, please contact [facility] Please consult [email or web address] for contact info under        Signed, Parke Poisson, MD

## 2023-12-13 DIAGNOSIS — R351 Nocturia: Secondary | ICD-10-CM | POA: Diagnosis not present

## 2023-12-13 DIAGNOSIS — Z905 Acquired absence of kidney: Secondary | ICD-10-CM | POA: Diagnosis not present

## 2023-12-13 DIAGNOSIS — C61 Malignant neoplasm of prostate: Secondary | ICD-10-CM | POA: Diagnosis not present

## 2023-12-13 DIAGNOSIS — C7951 Secondary malignant neoplasm of bone: Secondary | ICD-10-CM | POA: Diagnosis not present

## 2023-12-14 ENCOUNTER — Telehealth: Payer: Self-pay

## 2023-12-14 ENCOUNTER — Other Ambulatory Visit: Payer: Self-pay

## 2023-12-14 NOTE — Telephone Encounter (Signed)
 Spoke with pt via telephone regarding VM lft on this RN phone.  Pt was recently seen in the ED (12/07/2023 thru 12/09/2023) and wasn't sure if Dr. Mosetta Putt and her Team were aware of his recent hospitalization.  Pt stated he is scheduled on 12/16/2023 for chemo.  Pt wanted to know if he was going to be seen by a provider and if he would get chemo on 12/16/2023.  Informed pt that he is scheduled to see Vincent Gros, NP who is Dr. Latanya Maudlin NP.  Stated Herbert Seta will assess the pt and consult w/Dr. Mosetta Putt to determine if the pt is stable enough to proceed with tx on 12/16/2023.  Pt verbalized understanding and stated he is also scheduled to meet w/his PCP on 12/15/2023.  Pt had no further questions or concerns at this time.  Notified Dr. Mosetta Putt and her Team of the pt's call.

## 2023-12-15 ENCOUNTER — Other Ambulatory Visit: Payer: Self-pay | Admitting: Nurse Practitioner

## 2023-12-15 DIAGNOSIS — M6281 Muscle weakness (generalized): Secondary | ICD-10-CM | POA: Diagnosis not present

## 2023-12-15 DIAGNOSIS — D638 Anemia in other chronic diseases classified elsewhere: Secondary | ICD-10-CM

## 2023-12-15 DIAGNOSIS — I482 Chronic atrial fibrillation, unspecified: Secondary | ICD-10-CM | POA: Diagnosis not present

## 2023-12-15 DIAGNOSIS — R2689 Other abnormalities of gait and mobility: Secondary | ICD-10-CM | POA: Diagnosis not present

## 2023-12-15 DIAGNOSIS — C61 Malignant neoplasm of prostate: Secondary | ICD-10-CM | POA: Diagnosis not present

## 2023-12-15 DIAGNOSIS — I959 Hypotension, unspecified: Secondary | ICD-10-CM | POA: Diagnosis not present

## 2023-12-15 DIAGNOSIS — I509 Heart failure, unspecified: Secondary | ICD-10-CM | POA: Diagnosis not present

## 2023-12-15 DIAGNOSIS — Z9989 Dependence on other enabling machines and devices: Secondary | ICD-10-CM | POA: Diagnosis not present

## 2023-12-15 DIAGNOSIS — C259 Malignant neoplasm of pancreas, unspecified: Secondary | ICD-10-CM | POA: Diagnosis not present

## 2023-12-15 DIAGNOSIS — C250 Malignant neoplasm of head of pancreas: Secondary | ICD-10-CM

## 2023-12-15 DIAGNOSIS — G20B1 Parkinson's disease with dyskinesia, without mention of fluctuations: Secondary | ICD-10-CM | POA: Diagnosis not present

## 2023-12-15 NOTE — Assessment & Plan Note (Addendum)
 diagnosed in 09/2021, deemed poor surgical candidate due to his age and medical co-morbidities -S/p single agent gemcitabine 01/13/22 - 04/01/22 and 5 fx SBRT Jeryl Moris) 05/04/22 - 05/14/22. CA 19-9 decreased after treatment -Surveillance CT 11/09/2022 showed slightly decreased size of pancreatic head mass, no evidence of metastatic disease.  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  He is taking ibuprofen as needed.  No other clinical concern for cancer progression. -Lab reviewed, overall stable, tumor marker still pending. -He was admitted for right femoral neck fracture  on 02/03/23 after a fall and had surgery  -CT scan from May 17, 2023 showed multiple lung nodules, with dominant 1.5 cm nodule in the right middle lobe, highly concerning for metastasis.  His lung lesions are positive on PET scan. -Bronchoscopy and biopsy attempted, which showed atypical cells. -He restarted chemo gemcitabine and abraxane on 07/29/2023. -restaging CT 10/01/2023 showed stable disease  -Due to increased fatigue, chemo changed to single agent gemcitabine on October 21, 2023 - Recent hospitalization from 4/1 through 12/09/2023 due to acute on chronic CHF. --CT angio chest and CT abdomen pelvis done on 12/07/2023.  Negative for PE.  Stable pulmonary nodules.  Stable, severe dilation of biliary duct.  No evidence of metastatic disease in the chest, abdomen, pelvis. - 12/16/2023 -patient presents for cycle 6 day 1 gemcitabine.  Will continue with chemotherapy as scheduled.  Cycle 7 day 1 scheduled for 12/30/2023.

## 2023-12-15 NOTE — Progress Notes (Signed)
 Patient Care Team: Arva Lathe, MD as PCP - General (Internal Medicine) Olinda Bertrand, DO as PCP - Cardiology (Cardiology) Tat, Von Grumbling, DO as Consulting Physician (Neurology) Gwyn Leos, MD as Consulting Physician (Hematology and Oncology) Lujean Sake, MD as Consulting Physician (General Surgery) [name] Church [place], MD as Consulting Physician (Hematology and Oncology) Prudy Brownie, DO as Consulting Physician (Pulmonary Disease) Pickenpack-Cousar, Giles Labrum, NP as Nurse Practitioner [facility] and Palliative Medicine)  Clinic Day:  12/21/2023  Referring physician: Sonja [place], MD  ASSESSMENT & PLAN:   Assessment & Plan: Pancreatic cancer [facility]) diagnosed in 09/2021, deemed poor surgical candidate due to his age and medical co-morbidities -S/p single agent gemcitabine 01/13/22 - 04/01/22 and 5 fx SBRT [name] [name]) 05/04/22 - 05/14/22. CA 19-9 decreased after treatment -Surveillance CT 11/09/2022 showed slightly decreased size of pancreatic head mass, no evidence of metastatic disease.  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  He is taking ibuprofen as needed.  No other clinical concern for cancer progression. -Lab reviewed, overall stable, tumor marker still pending. -He was admitted for right femoral neck fracture  on 02/03/23 after a fall and had surgery  -CT scan from May 17, 2023 showed multiple lung nodules, with dominant 1.5 cm nodule in the right middle lobe, highly concerning for metastasis.  His lung lesions are positive on PET scan. -Bronchoscopy and biopsy attempted, which showed atypical cells. -He restarted chemo gemcitabine and abraxane on 07/29/2023. -restaging CT 10/01/2023 showed stable disease  -Due to increased fatigue, chemo changed to single agent gemcitabine on October 21, 2023 - Recent hospitalization from 4/1 through 12/09/2023 due to acute on chronic CHF. --CT angio chest and CT abdomen  pelvis done on 12/07/2023.  Negative for PE.  Stable pulmonary nodules.  Stable, severe dilation of biliary duct.  No evidence of metastatic disease in the chest, abdomen, pelvis. - 12/16/2023 -patient presents for cycle 6 day 1 gemcitabine.  Will continue with chemotherapy as scheduled.  Cycle 7 day 1 scheduled for 12/30/2023.   Acute on chronic heart failure Patient hospitalized 12/07/2023 through 12/09/2023 due to acute on chronic heart failure. Known history of A-fib/flutter. Shortness of breath, fatigue, and hypotension reasons for emergency department visit.  CTA of the chest negative for PE.  Positive for slightly increased bilateral pleural effusions.  Furosemide dose increased.  Patient stable upon hospital discharge Follow-up with his cardiologist as scheduled. . Constipation Patient feeling bloated with abdominal distention.  Last bowel movement was Monday.  Now taking MiraLAX every day.  Suggested he add Senokot or gentle laxative such as Dulcolax as needed.  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 Recent abnormal swallow study.  PT was recommended to strengthen swallow reflex. Patient will need to contact neurology for further evaluation and recommendations.  History of prostate cancer Patient continues to get Zytiga injections every 6 months per urology.  Plan: Labs reviewed.  Mild and slightly improved anemia.  Hgb 9.9 and HCT 30.8.  Other labs are stable and unremarkable. Proceed with cycle 6 day 1 of single agent chemotherapy gemcitabine. Labs/port flush, follow-up, and cycle 7 day 1 gemcitabine in 2 weeks.  He understands to contact the office sooner if new or worrisome symptoms develop.   The patient understands the plans discussed today and is in agreement with them.  He knows to contact our office if he develops concerns prior to his next appointment.  I provided 30  minutes of face-to-face time during this encounter and  > 50% was spent counseling as documented under my assessment and plan.    Sharyon Deis, NP  [facility] - A DEPT OF [facility] [address] [place] [address] Dept: [phone] Dept Fax: [phone]   No orders of the defined types were placed in this encounter.     CHIEF COMPLAINT:  CC: Pancreatic cancer  Current Treatment: Monotherapy chemotherapy, gemcitabine  INTERVAL HISTORY:  [name] [name] is here today for repeat clinical assessment.  Was last seen by Dr. Maryalice [name] on 12/02/2023.  He was hospitalized on 12/07/2023 through 12/09/2023 due to acute on chronic heart failure.  Has known history of atrial fibrillation.  On Eliquis.  Went to the emergency department due to hypotension and fatigue.  He had CT angio chest and CT abdomen pelvis.  He did not have PE.  He did have increased bilateral pleural effusion and interstitial edema.  Pulmonary nodules were stable.  There was no evidence of metastatic disease in the abdomen or pelvis.  There is no change in the severity of dilation of the pancreas.  While hospitalized, he did have echocardiogram and TEE which showed decreased EF at 35 to 40%.  Hypokinesis was noted.  Continues to have problems with dysphagia.  Abnormal swallow study prior to recent hospitalization.  Recommendation for PT to help with swallowing and cough reflex.  Will need to contact neurologist for further recommendations.  Today, he presents for Cycle 6 day 1 gemcitabine.  He continues to have moderate fatigue though, this is improving.  Having some constipation which started during his hospitalization.  Taking MiraLAX at home.  Not helping a whole lot. He denies chest pain or chest pressure.  He continues to have shortness of breath, though this is improving since hospital discharge. He denies headaches or visual disturbances. He denies abdominal pain, nausea, vomiting, or changes in bowel or bladder habits.   Constipation causing him to feel abdominal distention and bloating.  He denies fevers or chills.  His appetite is fair. His weight has been stable.  I have reviewed the past medical history, past surgical history, social history and family history with the patient and they are unchanged from previous note.  ALLERGIES:  is allergic to influenza vaccines and influenza virus vaccine.  MEDICATIONS:  Current Outpatient Medications  Medication Sig Dispense Refill   acetaminophen (TYLENOL) 500 MG tablet Take 1 tablet (500 mg total) by mouth every 6 (six) hours as needed for moderate pain or mild pain. (Patient taking differently: Take 500 mg by mouth as needed for moderate pain (pain score 4-6) or mild pain (pain score 1-3).) 30 tablet 0   amiodarone (PACERONE) 200 MG tablet Take 2 tabs twice daily x 7 days until 12/15/23 and from 12/16/23 take 1 tab daily 30 tablet 0   ammonium lactate (LAC-HYDRIN) 12 % lotion Apply 1 Application topically as needed.     bisacodyl (DULCOLAX) 10 MG suppository Place 1 suppository (10 mg total) rectally daily as needed for moderate constipation. 12 suppository 0   carbidopa-levodopa (SINEMET CR) 50-200 MG tablet TAKE 1 TABLET AT BEDTIME 90 tablet 3   carbidopa-levodopa (SINEMET IR) 25-100 MG tablet Take 3 tablets at 7 AM, 2 tablets at 11 AM, 3 tablets at 3 PM, 2 tablets at 7 PM 900 tablet 0   ciclopirox (PENLAC) 8 % solution Apply 1 Application topically once a week.  clonazePAM (KLONOPIN) 0.5 MG tablet Take 1 tablet (0.5 mg total) by mouth at bedtime. 5 tablet 0   cyanocobalamin (VITAMIN B12) 1000 MCG tablet Take 1,000 mcg by mouth daily.     ELIQUIS 5 MG TABS tablet TAKE 1 TABLET TWICE A DAY 120 tablet 5   furosemide (LASIX) 20 MG tablet Take 1 tablet (20 mg total) by mouth daily as needed for fluid or edema. 30 tablet 0   Glycerin-Hypromellose-PEG 400 (VISINE DRY EYE OP) Place 1 drop into both eyes 2 (two) times daily as needed (eye irritation).      ketoconazole  (NIZORAL) 2 % shampoo Apply 1 application  topically 3 (three) times a week.     leuprolide, 6 Month, (ELIGARD) 45 MG injection Inject 45 mg into the skin every 6 (six) months.     lidocaine-prilocaine (EMLA) cream Apply to port site 1 to 2 hours prior to appointments/treatment 30 g 3   metoprolol succinate (TOPROL-XL) 25 MG 24 hr tablet Take 1 tablet (25 mg total) by mouth daily. 30 tablet 0   midodrine (PROAMATINE) 5 MG tablet Take 1 tablet (5 mg total) by mouth 3 (three) times daily with meals. 90 tablet 0   omeprazole (PRILOSEC) 20 MG capsule Take 1 capsule (20 mg total) by mouth daily. 30 capsule 2   ondansetron (ZOFRAN) 8 MG tablet Take 1 tablet (8 mg total) by mouth every 8 (eight) hours as needed for nausea or vomiting. 30 tablet 1   oxyCODONE (OXY IR/ROXICODONE) 5 MG immediate release tablet Take 1 tablet (5 mg total) by mouth every 4 (four) hours as needed for severe pain (pain score 7-10). 90 tablet 0   Pancrelipase, Lip-Prot-Amyl, (PANCREAZE) 37000-97300 units CPEP TAKE 2 CAPSULES WITH BREAKFAST, LUNCH, AND EVENING MEAL (WITH EACH MEAL) AND 1 CAPSULE WITH SNACK 600 capsule 5   polyethylene glycol powder (MIRALAX) 17 GM/SCOOP powder Take 17 g by mouth in the morning, at noon, and at bedtime.     prochlorperazine (COMPAZINE) 10 MG tablet Take 1 tablet (10 mg total) by mouth every 6 (six) hours as needed for nausea or vomiting. 30 tablet 2   rosuvastatin (CRESTOR) 10 MG tablet TAKE 1 TABLET DAILY 90 tablet 1   tiZANidine (ZANAFLEX) 2 MG tablet Take 1 tablet (2 mg total) by mouth every 8 (eight) hours as needed for muscle spasms. 10 tablet 0   triamcinolone cream (KENALOG) 0.1 % Apply 1 application topically 2 (two) times daily. (Patient taking differently: Apply 1 application  topically 2 (two) times daily as needed (itching).) 453.6 g 1   zoledronic acid (RECLAST) 5 MG/100ML SOLN injection Inject 5 mg into the vein See admin instructions. Once a year     No current facility-administered  medications for this visit.    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 Sclerotic 1.5 cm left ischial lesion/ Sclerotic medial left iliac bone 1.5 cm lesion (series 2/image 47), increased from 1.0 cm. Gleason score of 4+5= 9; with almost all cores involved greater than 80%.  9/16 Lupron 28-month depot on 9/16. [Urology; Dr.Siniski]  # MID OCT 2020- Zytiga 1000 mg+ prednisone; stopped December 2021 [poor tolerance if with RVR]; DISCONTINUED.   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  Prostate cancer metastatic to bone (HCC)  05/24/2019 Initial Diagnosis   Prostate cancer metastatic to bone [facility])   Pancreatic cancer (HCC)  10/02/2021 Imaging   CT ABDOMEN PELVIS W CONTRAST   IMPRESSION: 1. There is new pancreatic ductal dilation with an indeterminate 19 mm hypodense low-density mass area in the head/uncinate process of the pancreas. Recommend further evaluation with dedicated MRI/MRCP with contrast. 2. No evidence of bowel obstruction.  Moderate rectal stool ball. 3. Scattered peripherally located clustered pulmonary nodules in the RIGHT middle lobe. Findings are likely infectious or inflammatory in etiology. Given history of malignancy, recommend follow-up as per clinical protocol.   10/28/2021 Imaging   MR ABDOMEN MRCP W WO CONTAST   IMPRESSION: 1. Examination is significantly limited by breath motion artifact throughout. 2. The main pancreatic duct is diffusely dilated from the level of the superior pancreatic head, measuring up to 0.7 cm. 3. In the inferior pancreatic head and uncinate, there is a multilobulated, fluid signal cystic lesion measuring 1.8 x 1.0 cm. Due to breath motion  artifact it is difficult to determine whether this communicates to the adjacent duct. There are multiple additional subcentimeter cystic lesions scattered throughout the pancreas, several of which clearly communicate to the main pancreatic duct. 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 Recommend EUS/FNA for further diagnosis given the presence of pancreatic ductal dilatation. 4. Status post left nephrectomy. 5. No evidence of recurrent or metastatic disease in the abdomen.   12/25/2021 Procedure   UPPER ENDOSCOPIC ULTRASOUND-By Dr. Brice Campi  - A mass-like region was identified in the pancreatic head where the pancreatic duct dilates with multiple cystic regions noted throughout the pancreas as well. 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 However, the endosonographic appearance is suspicious for potential adenocarcinoma. This was staged T2 N0 Mx by endosonographic criteria. T - No malignant-appearing lymph nodes were visualized in the celiac region (level 20), peripancreatic region and porta hepatis region.   12/25/2021 Pathology Results   CYTOLOGY - NON PAP  CASE: [id number]   FINAL MICROSCOPIC DIAGNOSIS:  A. PANCREAS, HEAD, FINE NEEDLE ASPIRATION:  - Malignant cells consistent with adenocarcinoma     01/01/2022 Initial Diagnosis   Pancreatic cancer (HCC)   01/13/2022 - 04/01/2022 Chemotherapy   Patient is on Treatment Plan : PANCREAS Gemcitabine D1,15 q28d x 4 Cycles     01/26/2022 Cancer Staging   Staging form: Exocrine Pancreas, AJCC 8th Edition - Clinical: Stage IB (cT2, cN0, cM0) - Signed by [name] Dixon, MD on 01/26/2022 Total positive nodes: 0    Genetic Testing   [facility] CancerNext-Expanded Panel was Negative. Of note, a variant of uncertain significance was identified in the BLM gene (p.N936D) and GALNT12 gene ([id number]). Report date is 01/26/2022.  The CancerNext-Expanded gene panel offered  by [facility] and includes sequencing, rearrangement, and RNA analysis for the following 77 genes: AIP, ALK, APC, ATM, AXIN2, BAP1, BARD1, BLM, BMPR1A, BRCA1, BRCA2, BRIP1, CDC73, CDH1, CDK4, CDKN1B, CDKN2A, CHEK2, CTNNA1, DICER1, FANCC, FH, FLCN, GALNT12, KIF1B, LZTR1, MAX, MEN1, MET, MLH1, MSH2, MSH3, MSH6, MUTYH, NBN, NF1, NF2, NTHL1, PALB2, PHOX2B, PMS2, POT1, PRKAR1A, PTCH1, PTEN, RAD51C, RAD51D, RB1, RECQL, RET, SDHA, SDHAF2, SDHB, SDHC, SDHD, SMAD4, SMARCA4, SMARCB1, SMARCE1, STK11, SUFU, TMEM127, TP53, TSC1, TSC2, VHL and XRCC2 (sequencing and deletion/duplication); EGFR, EGLN1, HOXB13, KIT, MITF, PDGFRA, POLD1, and POLE (sequencing only); EPCAM and GREM1 (deletion/duplication only).    08/12/2022 Imaging    IMPRESSION: 1. Slight interval increase in size of the  pancreatic head-uncinate process mass with increasing direct contact of the SMA and portal vein as discussed. Also with a replaced RIGHT hepatic artery which passes through the tumor. 2. No signs of acute inflammation currently about the pancreas. With similar appearance of ductal obstruction and peripheral atrophy of pancreas due to the tumor. 3. Post LEFT nephrectomy. 4. Sclerotic bony lesions of the LEFT ischium and LEFT iliac bone similar to prior imaging. 5. Mild hyperenhancement of LEFT prostate 14 mm with asymmetry, nonspecific. This is unchanged in this patient with history of prostate neoplasm. 6. Aortic atherosclerosis.   07/29/2023 -  Chemotherapy   Patient is on Treatment Plan : PANCREATIC Abraxane D1,8,15 + Gemcitabine D1,8,15 q28d     08/03/2023 Imaging   CT chest abdomen pelvis with contrast IMPRESSION: 1. No substantial change in size of heterogeneously enhancing pancreatic head mass with adjacent fiducials. 2. Slightly increased main pancreatic ductal dilation and mild intrahepatic bile duct dilation. 3. No evidence of new or progressive metastatic disease in the abdomen or pelvis. 4. Similar mildly  sclerotic focus in the left ischial tuberosity dating back to 01/07/2019. A left iliac bone faintly sclerotic lesion measuring 1.4 cm has decreased in density since 2020, likely treated osseous metastasis. 5. Left lower lobe nodules measuring up to 5 mm are unchanged. 6. Aortic Atherosclerosis (ICD10-I70.0).         REVIEW OF SYSTEMS:   Constitutional: Denies fevers, chills or abnormal weight loss Eyes: Denies blurriness of vision Ears, nose, mouth, throat, and face: Denies mucositis or sore throat Respiratory: Denies cough, dyspnea or wheezes Cardiovascular: Denies palpitation, chest discomfort or lower extremity swelling Gastrointestinal:  Denies nausea, heartburn or change in bowel habits Skin: Denies abnormal skin rashes Lymphatics: Denies new lymphadenopathy or easy bruising Neurological:Denies numbness, tingling or new weaknesses Behavioral/Psych: Mood is stable, no new changes  All other systems were reviewed with the patient and are negative.   VITALS:   Today's Vitals   12/16/23 1337  BP: 116/78  Pulse: (!) 58  Resp: 20  Temp: 97.6 F (36.4 C)  TempSrc: Temporal  SpO2: 98%  Weight: 132 lb 3.2 oz (60 kg)  PainSc: 5    Body mass index is 22.69 kg/m.   Wt Readings from Last 3 Encounters:  12/16/23 132 lb 3.2 oz (60 kg)  12/09/23 132 lb 15 oz (60.3 kg)  12/02/23 139 lb 6.4 oz (63.2 kg)    Body mass index is 22.69 kg/m.  Performance status (ECOG): 2 - Symptomatic, <50% confined to bed  PHYSICAL EXAM:   GENERAL:alert, no distress and comfortable SKIN: skin color, texture, turgor are normal, no rashes or significant lesions EYES: normal, Conjunctiva are pink and non-injected, sclera clear OROPHARYNX:no exudate, no erythema and lips, buccal mucosa, and tongue normal  NECK: supple, thyroid normal size, non-tender, without nodularity LYMPH:  no palpable lymphadenopathy in the cervical, axillary or inguinal LUNGS: clear to auscultation and percussion with  normal breathing effort HEART: regular rate & rhythm and no murmurs and no lower extremity edema ABDOMEN:abdomen soft, non-tender and normal bowel sounds Musculoskeletal:no cyanosis of digits and no clubbing  NEURO: alert & oriented x 3 with fluent speech, no focal motor/sensory deficits  LABORATORY DATA:  I have reviewed the data as listed    Component Value Date/Time   NA 138 12/16/2023 1243   NA 139 12/04/2021 1359   K 4.3 12/16/2023 1243   CL 107 12/16/2023 1243   CO2 27 12/16/2023 1243   GLUCOSE 140 (H) 12/16/2023 1243  BUN 27 (H) 12/16/2023 1243   BUN 20 12/04/2021 1359   CREATININE 1.03 12/16/2023 1243   CALCIUM 9.2 12/16/2023 1243   PROT 7.0 12/16/2023 1243   PROT 6.8 12/04/2021 1359   ALBUMIN 3.9 12/16/2023 1243   ALBUMIN 4.6 12/04/2021 1359   AST 39 12/16/2023 1243   ALT 7 12/16/2023 1243   ALKPHOS 96 12/16/2023 1243   BILITOT 0.9 12/16/2023 1243   GFRNONAA >60 12/16/2023 1243   GFRAA 47 (L) 05/06/2020 0935     Lab Results  Component Value Date   WBC 6.3 12/16/2023   NEUTROABS 4.0 12/16/2023   HGB 9.9 (L) 12/16/2023   HCT 30.8 (L) 12/16/2023   MCV 95.7 12/16/2023   PLT 189 12/16/2023     RADIOGRAPHIC STUDIES: ECHOCARDIOGRAM COMPLETE Result Date: 12/08/2023    ECHOCARDIOGRAM REPORT   Patient Name:   [name] [name] Date of Exam: 12/08/2023 Medical Rec #:  [id number]        Height:       64.0 in Accession #:    [id number]       Weight:       138.7 lb Date of Birth:  25-Aug-1941       BSA:          1.674 m Patient Age:    82 years         BP:           91/74 mmHg Patient Gender: M                HR:           125 bpm. Exam Location:  Inpatient Procedure: 2D Echo, Cardiac Doppler and Color Doppler (Both Spectral and Color            Flow Doppler were utilized during procedure). Indications:    CHF  History:        Patient has prior history of Echocardiogram examinations, most                 recent 04/30/2021. Arrythmias:Atrial Fibrillation.  Sonographer:    Amy  Chionchio Referring Phys: [id number] VISHAL R PATEL IMPRESSIONS  1. Left ventricular ejection fraction, by estimation, is 35 to 40%. The left ventricle has moderately decreased function. The left ventricle demonstrates global hypokinesis. There is moderate left ventricular hypertrophy. Left ventricular diastolic parameters are indeterminate.  2. Right ventricular systolic function is mildly reduced. The right ventricular size is mildly enlarged.  3. Left atrial size was severely dilated.  4. Right atrial size was severely dilated.  5. The mitral valve is normal in structure. Mild mitral valve regurgitation.  6. The aortic valve is tricuspid. Aortic valve regurgitation is mild. No aortic stenosis is present. FINDINGS  Left Ventricle: Left ventricular ejection fraction, by estimation, is 35 to 40%. The left ventricle has moderately decreased function. The left ventricle demonstrates global hypokinesis. The left ventricular internal cavity size was normal in size. There is moderate left ventricular hypertrophy. Left ventricular diastolic parameters are indeterminate. Right Ventricle: The right ventricular size is mildly enlarged. No increase in right ventricular wall thickness. Right ventricular systolic function is mildly reduced. Left Atrium: Left atrial size was severely dilated. Right Atrium: Right atrial size was severely dilated. Pericardium: Trivial pericardial effusion is present. Mitral Valve: The mitral valve is normal in structure. Mild mitral valve regurgitation. MV peak gradient, 2.1 mmHg. The mean mitral valve gradient is 1.0 mmHg. Tricuspid Valve: The tricuspid valve is normal in structure. Tricuspid valve regurgitation  is mild. Aortic Valve: The aortic valve is tricuspid. Aortic valve regurgitation is mild. Aortic regurgitation PHT measures 751 msec. No aortic stenosis is present. Aortic valve mean gradient measures 2.0 mmHg. Aortic valve peak gradient measures 3.1 mmHg. Aortic  valve area, by VTI measures  3.36 cm. Pulmonic Valve: The pulmonic valve was not well visualized. Pulmonic valve regurgitation is not visualized. Aorta: The aortic root is normal in size and structure. IAS/Shunts: The interatrial septum was not well visualized.  LEFT VENTRICLE PLAX 2D LVIDd:         4.50 cm LVIDs:         4.30 cm LV PW:         1.30 cm LV IVS:        1.30 cm LVOT diam:     2.30 cm LV SV:         42 LV SV Index:   25 LVOT Area:     4.15 cm  LV Volumes (MOD) LV vol d, MOD A4C: 96.5 ml LV vol s, MOD A4C: 60.1 ml LV SV MOD A4C:     96.5 ml RIGHT VENTRICLE RV Basal diam:  4.00 cm RV Mid diam:    3.80 cm TAPSE (M-mode): 2.0 cm LEFT ATRIUM              Index        RIGHT ATRIUM           Index LA Vol (A2C):   70.5 ml  42.11 ml/m  RA Area:     25.40 cm LA Vol (A4C):   126.0 ml 75.25 ml/m  RA Volume:   82.10 ml  49.03 ml/m LA Biplane Vol: 97.6 ml  58.29 ml/m  AORTIC VALVE                    PULMONIC VALVE AV Area (Vmax):    3.57 cm     PV Vmax:          1.20 m/s AV Area (Vmean):   3.02 cm     PV Peak grad:     5.8 mmHg AV Area (VTI):     3.36 cm     PR End Diast Vel: 8.64 msec AV Vmax:           88.30 cm/s AV Vmean:          70.300 cm/s AV VTI:            0.125 m AV Peak Grad:      3.1 mmHg AV Mean Grad:      2.0 mmHg LVOT Vmax:         75.80 cm/s LVOT Vmean:        51.100 cm/s LVOT VTI:          0.101 m LVOT/AV VTI ratio: 0.81 AI PHT:            751 msec  AORTA Ao Root diam: 3.90 cm Ao Asc diam:  3.80 cm MITRAL VALVE              TRICUSPID VALVE MV Area (PHT): 3.67 cm   TR Peak grad:   21.0 mmHg MV Area VTI:   5.42 cm   TR Vmax:        229.00 cm/s MV Peak grad:  2.1 mmHg MV Mean grad:  1.0 mmHg   SHUNTS MV Vmax:       0.72 m/s   Systemic VTI:  0.10 m MV Vmean:  37.7 cm/s  Systemic Diam: 2.30 cm Carson Clara MD Electronically signed by Carson Clara MD Signature Date/Time: 12/08/2023/1:22:28 PM    Final    CT Angio Chest PE W and/or Wo Contrast Result Date: 12/07/2023 CLINICAL DATA:  Concern for pulmonary  embolism. Abdominal pain and diarrhea. Pancreatic cancer undergoing therapy. * Tracking Code: BO * EXAM: CT ANGIOGRAPHY CHEST CT ABDOMEN AND PELVIS WITH CONTRAST TECHNIQUE: Multidetector CT imaging of the chest was performed using the standard protocol during bolus administration of intravenous contrast. Multiplanar CT image reconstructions and MIPs were obtained to evaluate the vascular anatomy. Multidetector CT imaging of the abdomen and pelvis was performed using the standard protocol during bolus administration of intravenous contrast. RADIATION DOSE REDUCTION: This exam was performed according to the departmental dose-optimization program which includes automated exposure control, adjustment of the mA and/or kV according to patient size and/or use of iterative reconstruction technique. CONTRAST:  85mL OMNIPAQUE IOHEXOL 350 MG/ML SOLN COMPARISON:  10/01/2023 FINDINGS: CTA CHEST FINDINGS Cardiovascular: No filling defects within the pulmonary arteries to suggest acute pulmonary embolism. Mediastinum/Nodes: No axillary or supraclavicular adenopathy. No mediastinal or hilar adenopathy. No pericardial fluid. Esophagus normal. Port in the anterior chest wall with tip in distal SVC. Lungs/Pleura: Bilateral pleural effusions increased from prior. Interstitial thickening suggesting interstitial edema is new from prior. Nodular density in the RIGHT lower lobe measuring 13 mm (image 71/4) compared to 12 mm on prior. Nodule in the lateral aspect RIGHT middle lobe measuring 14 mm is unchanged. Musculoskeletal: Review of the MIP images confirms the above findings. CT ABDOMEN and PELVIS FINDINGS Hepatobiliary: Cyst in the superior central LEFT hepatic lobe is unchanged. No duct dilatation evident. Periportal edema is present. Gallbladder is nondistended. Common bile duct nondistended. Pancreas: Severe dilatation of the pancreatic duct in the body and tail. No change from comparison exam. Calcification in the head of the  pancreas. Dilatation extends of 2 18 mm in the mid pancreas not changed from prior. Spleen: Normal spleen Adrenals/urinary tract: Solitary RIGHT kidney. Ureter and bladder normal. Stomach/Bowel: Stomach, small bowel, appendix, and cecum are normal. Moderate volume stool throughout the colon. Multiple diverticula of the descending colon and sigmoid colon without acute inflammation. Vascular/Lymphatic: Abdominal aorta is normal caliber with atherosclerotic calcification. There is no retroperitoneal or periportal lymphadenopathy. No pelvic lymphadenopathy. Reproductive: Unremarkable Other: No omental peritoneal metastasis Musculoskeletal: No aggressive osseous lesion. Stable sclerotic lesion in the inferior LEFT ischium. Post RIGHT hip arthroplasty Review of the MIP images confirms the above findings. IMPRESSION: CHEST: 1. No evidence acute pulmonary embolism. 2. Increased bilateral pleural effusions and interstitial edema. 3. Stable pulmonary nodules in the RIGHT middle lobe and RIGHT lower lobe. PELVIS: 1. No change in severe duct dilatation of the pancreas. No biliary duct dilatation. Pancreatic mass is difficult to define CT imaging 2. No evidence of metastatic disease in the abdomen pelvis. 3. Solitary RIGHT kidney appears normal Electronically Signed   By: Deboraha Fallow M.D.   On: 12/07/2023 15:49   CT ABDOMEN PELVIS W CONTRAST Result Date: 12/07/2023 CLINICAL DATA:  Concern for pulmonary embolism. Abdominal pain and diarrhea. Pancreatic cancer undergoing therapy. * Tracking Code: BO * EXAM: CT ANGIOGRAPHY CHEST CT ABDOMEN AND PELVIS WITH CONTRAST TECHNIQUE: Multidetector CT imaging of the chest was performed using the standard protocol during bolus administration of intravenous contrast. Multiplanar CT image reconstructions and MIPs were obtained to evaluate the vascular anatomy. Multidetector CT imaging of the abdomen and pelvis was performed using the standard protocol during bolus administration of  intravenous contrast. RADIATION DOSE REDUCTION: This exam was performed according to the departmental dose-optimization program which includes automated exposure control, adjustment of the mA and/or kV according to patient size and/or use of iterative reconstruction technique. CONTRAST:  85mL OMNIPAQUE IOHEXOL 350 MG/ML SOLN COMPARISON:  10/01/2023 FINDINGS: CTA CHEST FINDINGS Cardiovascular: No filling defects within the pulmonary arteries to suggest acute pulmonary embolism. Mediastinum/Nodes: No axillary or supraclavicular adenopathy. No mediastinal or hilar adenopathy. No pericardial fluid. Esophagus normal. Port in the anterior chest wall with tip in distal SVC. Lungs/Pleura: Bilateral pleural effusions increased from prior. Interstitial thickening suggesting interstitial edema is new from prior. Nodular density in the RIGHT lower lobe measuring 13 mm (image 71/4) compared to 12 mm on prior. Nodule in the lateral aspect RIGHT middle lobe measuring 14 mm is unchanged. Musculoskeletal: Review of the MIP images confirms the above findings. CT ABDOMEN and PELVIS FINDINGS Hepatobiliary: Cyst in the superior central LEFT hepatic lobe is unchanged. No duct dilatation evident. Periportal edema is present. Gallbladder is nondistended. Common bile duct nondistended. Pancreas: Severe dilatation of the pancreatic duct in the body and tail. No change from comparison exam. Calcification in the head of the pancreas. Dilatation extends of 2 18 mm in the mid pancreas not changed from prior. Spleen: Normal spleen Adrenals/urinary tract: Solitary RIGHT kidney. Ureter and bladder normal. Stomach/Bowel: Stomach, small bowel, appendix, and cecum are normal. Moderate volume stool throughout the colon. Multiple diverticula of the descending colon and sigmoid colon without acute inflammation. Vascular/Lymphatic: Abdominal aorta is normal caliber with atherosclerotic calcification. There is no retroperitoneal or periportal  lymphadenopathy. No pelvic lymphadenopathy. Reproductive: Unremarkable Other: No omental peritoneal metastasis Musculoskeletal: No aggressive osseous lesion. Stable sclerotic lesion in the inferior LEFT ischium. Post RIGHT hip arthroplasty Review of the MIP images confirms the above findings. IMPRESSION: CHEST: 1. No evidence acute pulmonary embolism. 2. Increased bilateral pleural effusions and interstitial edema. 3. Stable pulmonary nodules in the RIGHT middle lobe and RIGHT lower lobe. PELVIS: 1. No change in severe duct dilatation of the pancreas. No biliary duct dilatation. Pancreatic mass is difficult to define CT imaging 2. No evidence of metastatic disease in the abdomen pelvis. 3. Solitary RIGHT kidney appears normal Electronically Signed   By: Deboraha Fallow M.D.   On: 12/07/2023 15:49

## 2023-12-16 ENCOUNTER — Inpatient Hospital Stay: Payer: Medicare Other

## 2023-12-16 ENCOUNTER — Encounter: Payer: Self-pay | Admitting: Hematology

## 2023-12-16 ENCOUNTER — Inpatient Hospital Stay (HOSPITAL_BASED_OUTPATIENT_CLINIC_OR_DEPARTMENT_OTHER): Payer: Medicare Other | Admitting: Nurse Practitioner

## 2023-12-16 ENCOUNTER — Inpatient Hospital Stay: Payer: Medicare Other | Attending: Internal Medicine

## 2023-12-16 ENCOUNTER — Other Ambulatory Visit: Payer: Self-pay | Admitting: Nurse Practitioner

## 2023-12-16 VITALS — BP 116/78 | HR 58 | Temp 97.6°F | Resp 20 | Wt 132.2 lb

## 2023-12-16 DIAGNOSIS — Z7901 Long term (current) use of anticoagulants: Secondary | ICD-10-CM | POA: Diagnosis not present

## 2023-12-16 DIAGNOSIS — R059 Cough, unspecified: Secondary | ICD-10-CM | POA: Insufficient documentation

## 2023-12-16 DIAGNOSIS — N183 Chronic kidney disease, stage 3 unspecified: Secondary | ICD-10-CM | POA: Diagnosis not present

## 2023-12-16 DIAGNOSIS — I700 Atherosclerosis of aorta: Secondary | ICD-10-CM | POA: Diagnosis not present

## 2023-12-16 DIAGNOSIS — C61 Malignant neoplasm of prostate: Secondary | ICD-10-CM

## 2023-12-16 DIAGNOSIS — K5900 Constipation, unspecified: Secondary | ICD-10-CM | POA: Diagnosis not present

## 2023-12-16 DIAGNOSIS — Z887 Allergy status to serum and vaccine status: Secondary | ICD-10-CM | POA: Insufficient documentation

## 2023-12-16 DIAGNOSIS — Z5111 Encounter for antineoplastic chemotherapy: Secondary | ICD-10-CM | POA: Insufficient documentation

## 2023-12-16 DIAGNOSIS — C250 Malignant neoplasm of head of pancreas: Secondary | ICD-10-CM

## 2023-12-16 DIAGNOSIS — G20A1 Parkinson's disease without dyskinesia, without mention of fluctuations: Secondary | ICD-10-CM | POA: Diagnosis not present

## 2023-12-16 DIAGNOSIS — M545 Low back pain, unspecified: Secondary | ICD-10-CM | POA: Diagnosis not present

## 2023-12-16 DIAGNOSIS — I509 Heart failure, unspecified: Secondary | ICD-10-CM | POA: Diagnosis not present

## 2023-12-16 DIAGNOSIS — D649 Anemia, unspecified: Secondary | ICD-10-CM | POA: Insufficient documentation

## 2023-12-16 DIAGNOSIS — Z95828 Presence of other vascular implants and grafts: Secondary | ICD-10-CM

## 2023-12-16 DIAGNOSIS — C7951 Secondary malignant neoplasm of bone: Secondary | ICD-10-CM | POA: Diagnosis not present

## 2023-12-16 DIAGNOSIS — R1310 Dysphagia, unspecified: Secondary | ICD-10-CM | POA: Diagnosis not present

## 2023-12-16 DIAGNOSIS — I4891 Unspecified atrial fibrillation: Secondary | ICD-10-CM | POA: Insufficient documentation

## 2023-12-16 DIAGNOSIS — Z905 Acquired absence of kidney: Secondary | ICD-10-CM | POA: Diagnosis not present

## 2023-12-16 DIAGNOSIS — Z79899 Other long term (current) drug therapy: Secondary | ICD-10-CM | POA: Diagnosis not present

## 2023-12-16 DIAGNOSIS — M8088XA Other osteoporosis with current pathological fracture, vertebra(e), initial encounter for fracture: Secondary | ICD-10-CM

## 2023-12-16 DIAGNOSIS — K861 Other chronic pancreatitis: Secondary | ICD-10-CM | POA: Diagnosis not present

## 2023-12-16 DIAGNOSIS — C259 Malignant neoplasm of pancreas, unspecified: Secondary | ICD-10-CM

## 2023-12-16 DIAGNOSIS — D638 Anemia in other chronic diseases classified elsewhere: Secondary | ICD-10-CM

## 2023-12-16 LAB — FERRITIN: Ferritin: 137 ng/mL (ref 24–336)

## 2023-12-16 LAB — CMP (CANCER CENTER ONLY)
ALT: 7 U/L (ref 0–44)
AST: 39 U/L (ref 15–41)
Albumin: 3.9 g/dL (ref 3.5–5.0)
Alkaline Phosphatase: 96 U/L (ref 38–126)
Anion gap: 4 — ABNORMAL LOW (ref 5–15)
BUN: 27 mg/dL — ABNORMAL HIGH (ref 8–23)
CO2: 27 mmol/L (ref 22–32)
Calcium: 9.2 mg/dL (ref 8.9–10.3)
Chloride: 107 mmol/L (ref 98–111)
Creatinine: 1.03 mg/dL (ref 0.61–1.24)
GFR, Estimated: >60 mL/min (ref >60)
Glucose, Bld: 140 mg/dL — ABNORMAL HIGH (ref 70–99)
Potassium: 4.3 mmol/L (ref 3.5–5.1)
Sodium: 138 mmol/L (ref 135–145)
Total Bilirubin: 0.9 mg/dL (ref 0.0–1.2)
Total Protein: 7 g/dL (ref 6.5–8.1)

## 2023-12-16 LAB — CBC WITH DIFFERENTIAL (CANCER CENTER ONLY)
Abs Immature Granulocytes: 0.02 10*3/uL (ref 0.00–0.07)
Basophils Absolute: 0 10*3/uL (ref 0.0–0.1)
Basophils Relative: 1 %
Eosinophils Absolute: 0.1 10*3/uL (ref 0.0–0.5)
Eosinophils Relative: 1 %
HCT: 30.8 % — ABNORMAL LOW (ref 39.0–52.0)
Hemoglobin: 9.9 g/dL — ABNORMAL LOW (ref 13.0–17.0)
Immature Granulocytes: 0 %
Lymphocytes Relative: 21 %
Lymphs Abs: 1.3 10*3/uL (ref 0.7–4.0)
MCH: 30.7 pg (ref 26.0–34.0)
MCHC: 32.1 g/dL (ref 30.0–36.0)
MCV: 95.7 fL (ref 80.0–100.0)
Monocytes Absolute: 0.8 10*3/uL (ref 0.1–1.0)
Monocytes Relative: 13 %
Neutro Abs: 4 10*3/uL (ref 1.7–7.7)
Neutrophils Relative %: 64 %
Platelet Count: 189 10*3/uL (ref 150–400)
RBC: 3.22 MIL/uL — ABNORMAL LOW (ref 4.22–5.81)
RDW: 18.7 % — ABNORMAL HIGH (ref 11.5–15.5)
WBC Count: 6.3 10*3/uL (ref 4.0–10.5)
nRBC: 0 % (ref 0.0–0.2)

## 2023-12-16 LAB — IRON AND IRON BINDING CAPACITY (CC-WL,HP ONLY)
Iron: 57 ug/dL (ref 45–182)
Saturation Ratios: 13 % — ABNORMAL LOW (ref 17.9–39.5)
TIBC: 424 ug/dL (ref 250–450)
UIBC: 367 ug/dL (ref 117–376)

## 2023-12-16 MED ORDER — SODIUM CHLORIDE 0.9% FLUSH
10.0000 mL | INTRAVENOUS | Status: DC | PRN
  Administered 2023-12-16: 10 mL

## 2023-12-16 MED ORDER — PROCHLORPERAZINE MALEATE 10 MG PO TABS
10.0000 mg | ORAL_TABLET | Freq: Once | ORAL | Status: AC
  Administered 2023-12-16: 10 mg via ORAL
  Filled 2023-12-16: qty 1

## 2023-12-16 MED ORDER — SODIUM CHLORIDE 0.9% FLUSH
10.0000 mL | Freq: Once | INTRAVENOUS | Status: AC
  Administered 2023-12-16: 10 mL

## 2023-12-16 MED ORDER — GEMCITABINE HCL CHEMO INJECTION 1 GM/26.3ML
800.0000 mg/m2 | Freq: Once | INTRAVENOUS | Status: AC
  Administered 2023-12-16: 1406 mg via INTRAVENOUS
  Filled 2023-12-16: qty 36.98

## 2023-12-16 MED ORDER — OXYCODONE HCL 5 MG PO TABS
5.0000 mg | ORAL_TABLET | ORAL | 0 refills | Status: DC | PRN
Start: 2023-12-16 — End: 2024-03-01

## 2023-12-16 MED ORDER — LEUPROLIDE ACETATE (6 MONTH) 45 MG ~~LOC~~ KIT
45.0000 mg | PACK | Freq: Once | SUBCUTANEOUS | Status: DC
Start: 2023-12-16 — End: 2023-12-16
  Filled 2023-12-16: qty 45

## 2023-12-16 MED ORDER — SODIUM CHLORIDE 0.9 % IV SOLN: INTRAVENOUS | Status: DC

## 2023-12-16 MED ORDER — HEPARIN SOD (PORK) LOCK FLUSH 100 UNIT/ML IV SOLN
500.0000 [IU] | Freq: Once | INTRAVENOUS | Status: AC | PRN
  Administered 2023-12-16: 500 [IU]

## 2023-12-16 NOTE — Patient Instructions (Signed)
 [facility]  Discharge Instructions: Thank you for choosing [facility] to provide your oncology and hematology care.   If you have a lab appointment with the Cancer Center, please go directly to the Cancer Center and check in at the registration area.   Wear comfortable clothing and clothing appropriate for easy access to any Portacath or PICC line.   We strive to give you quality time with your provider. You may need to reschedule your appointment if you arrive late (15 or more minutes).  Arriving late affects you and other patients whose appointments are after yours.  Also, if you miss three or more appointments without notifying the office, you may be dismissed from the clinic at the provider's discretion.      For prescription refill requests, have your pharmacy contact our office and allow 72 hours for refills to be completed.    Today you received the following chemotherapy and/or immunotherapy agents: gemcitabine (GEMZAR       To help prevent nausea and vomiting after your treatment, we encourage you to take your nausea medication as directed.  BELOW ARE SYMPTOMS THAT SHOULD BE REPORTED IMMEDIATELY: *FEVER GREATER THAN 100.4 F (38 C) OR HIGHER *CHILLS OR SWEATING *NAUSEA AND VOMITING THAT IS NOT CONTROLLED WITH YOUR NAUSEA MEDICATION *UNUSUAL SHORTNESS OF BREATH *UNUSUAL BRUISING OR BLEEDING *URINARY PROBLEMS (pain or burning when urinating, or frequent urination) *BOWEL PROBLEMS (unusual diarrhea, constipation, pain near the anus) TENDERNESS IN MOUTH AND THROAT WITH OR WITHOUT PRESENCE OF ULCERS (sore throat, sores in mouth, or a toothache) UNUSUAL RASH, SWELLING OR PAIN  UNUSUAL VAGINAL DISCHARGE OR ITCHING   Items with * indicate a potential emergency and should be followed up as soon as possible or go to the Emergency Department if any problems should occur.  Please show the CHEMOTHERAPY ALERT CARD or  IMMUNOTHERAPY ALERT CARD at check-in to the Emergency Department and triage nurse.  Should you have questions after your visit or need to cancel or reschedule your appointment, please contact [facility]  Dept: [phone]  and follow the prompts.  Office hours are 8:00 a.m. to 4:30 p.m. Monday - Friday. Please note that voicemails left after 4:00 p.m. may not be returned until the following business day.  We are closed weekends and major holidays. You have access to a nurse at all times for urgent questions. Please call the main number to the clinic Dept: [phone] and follow the prompts.   For any non-urgent questions, you may also contact your provider using MyChart. We now offer e-Visits for anyone 77 and older to request care online for non-urgent symptoms. For details visit [email or web address].   Also download the MyChart app! Go to the app store, search "MyChart", open the app, select [place], and log in with your MyChart username and password.

## 2023-12-17 LAB — CANCER ANTIGEN 19-9: CA 19-9: 498 U/mL — ABNORMAL HIGH (ref 0–35)

## 2023-12-20 DIAGNOSIS — B351 Tinea unguium: Secondary | ICD-10-CM | POA: Diagnosis not present

## 2023-12-20 DIAGNOSIS — L600 Ingrowing nail: Secondary | ICD-10-CM | POA: Diagnosis not present

## 2023-12-21 ENCOUNTER — Encounter: Payer: Self-pay | Admitting: Nurse Practitioner

## 2023-12-21 ENCOUNTER — Encounter: Payer: Self-pay | Admitting: Hematology

## 2023-12-21 ENCOUNTER — Encounter: Payer: Self-pay | Admitting: Internal Medicine

## 2023-12-21 DIAGNOSIS — R2689 Other abnormalities of gait and mobility: Secondary | ICD-10-CM | POA: Diagnosis not present

## 2023-12-21 DIAGNOSIS — M6281 Muscle weakness (generalized): Secondary | ICD-10-CM | POA: Diagnosis not present

## 2023-12-23 ENCOUNTER — Encounter (HOSPITAL_COMMUNITY): Payer: Self-pay | Admitting: Physician Assistant

## 2023-12-23 ENCOUNTER — Ambulatory Visit (HOSPITAL_COMMUNITY)
Admit: 2023-12-23 | Discharge: 2023-12-23 | Disposition: A | Discharge status: Home / Self Care | Source: Ambulatory Visit | Attending: Physician Assistant | Admitting: Physician Assistant

## 2023-12-23 VITALS — BP 94/80 | HR 89 | Ht 64.0 in | Wt 131.2 lb

## 2023-12-23 DIAGNOSIS — Z7969 Long term (current) use of other immunomodulators and immunosuppressants: Secondary | ICD-10-CM | POA: Diagnosis not present

## 2023-12-23 DIAGNOSIS — G20A1 Parkinson's disease without dyskinesia, without mention of fluctuations: Secondary | ICD-10-CM | POA: Insufficient documentation

## 2023-12-23 DIAGNOSIS — I484 Atypical atrial flutter: Secondary | ICD-10-CM | POA: Diagnosis not present

## 2023-12-23 DIAGNOSIS — I700 Atherosclerosis of aorta: Secondary | ICD-10-CM | POA: Insufficient documentation

## 2023-12-23 DIAGNOSIS — I4510 Unspecified right bundle-branch block: Secondary | ICD-10-CM | POA: Diagnosis not present

## 2023-12-23 DIAGNOSIS — D6869 Other thrombophilia: Secondary | ICD-10-CM | POA: Insufficient documentation

## 2023-12-23 DIAGNOSIS — Z87891 Personal history of nicotine dependence: Secondary | ICD-10-CM | POA: Diagnosis not present

## 2023-12-23 DIAGNOSIS — C259 Malignant neoplasm of pancreas, unspecified: Secondary | ICD-10-CM | POA: Insufficient documentation

## 2023-12-23 DIAGNOSIS — I4819 Other persistent atrial fibrillation: Secondary | ICD-10-CM | POA: Diagnosis not present

## 2023-12-23 DIAGNOSIS — Z79899 Other long term (current) drug therapy: Secondary | ICD-10-CM | POA: Insufficient documentation

## 2023-12-23 DIAGNOSIS — Z7901 Long term (current) use of anticoagulants: Secondary | ICD-10-CM | POA: Insufficient documentation

## 2023-12-23 DIAGNOSIS — Z5181 Encounter for therapeutic drug level monitoring: Secondary | ICD-10-CM

## 2023-12-23 DIAGNOSIS — I5022 Chronic systolic (congestive) heart failure: Secondary | ICD-10-CM | POA: Diagnosis not present

## 2023-12-23 DIAGNOSIS — I480 Paroxysmal atrial fibrillation: Secondary | ICD-10-CM | POA: Diagnosis present

## 2023-12-23 DIAGNOSIS — G4733 Obstructive sleep apnea (adult) (pediatric): Secondary | ICD-10-CM | POA: Insufficient documentation

## 2023-12-23 NOTE — Patient Instructions (Signed)
 Cardioversion scheduled for: Monday, May 12th   - Arrive at the [employer] [facility] ([address]  and check in with ADMITTING at 9:00am   - Do not eat or drink anything after midnight the night prior to your procedure.   - Take all your morning medication (except diabetic medications) with a sip of water prior to arrival.  - You will not be able to drive home after your procedure. Please ensure you have a responsible adult to drive you home. You will need someone with you for 24 hours post procedure.    - Do NOT miss any doses of your blood thinner - if you should miss a dose or take a dose more than 4 hours late -- please notify our office immediately.    - Expect to be in the procedural area approximately 2 hours.   - If you feel as if you go back into normal rhythm prior to scheduled cardioversion, please notify our office immediately.   If your procedure is canceled in the cardioversion suite you will be charged a cancellation fee.

## 2023-12-23 NOTE — Progress Notes (Signed)
 Primary Care Physician: [name] Ishihara, MD Primary Cardiologist: [name] Lerner, DO Electrophysiologist: None  Referring Physician: Dr. Ma [name] is a 83 y.o. male with a history of pancreatic cancer undergoing chemotherapy, kidney donor, former cigar smoker, OSA on BiPAP, Parkinson's disease, aortic atherosclerosis, and paroxysmal atrial flutter / fibrillation who presents for follow up in the [facility]. Cancer center on 08/27/23 noted patient to have HR 29-121 while receiving infusion. Hypotension noted intermittently with blurry vision as a symptom. Dr. Odis [name] recommended on 09/09/23 for patient to switch back to diltiazem 30 mg BID with option of taking q 6 hr PRN and to hold medication if SBP less than 100 and/or HR < 55. Patient is on Eliquis for stroke prevention.  Patient was admitted 12/07/23 with hypotension and fatigue. 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 He was loaded on amiodarone and his diltiazem was transitioned to metoprolol.   Patient returns for follow up for atrial fibrillation and amiodarone monitoring. He reports that he feels fatigued most of the time but is unsure if this is from the arrhythmia or cancer treatment. He has been checking his HR and BP at home. His rates have been < 100 bpm. No bleeding issues on anticoagulation.   Today, he  denies symptoms of palpitations, chest pain, shortness of breath, orthopnea, PND, lower extremity edema, dizziness, presyncope, syncope, bleeding, or neurologic sequela. The patient is tolerating medications without difficulties and is otherwise without complaint today.    Atrial Fibrillation Risk Factors:  he does have symptoms or diagnosis of sleep apnea.   he has a BMI of Body mass index is 22.52 kg/m.Marland Kitchen Filed Weights   12/23/23 1502  Weight: 59.5 kg     Current Outpatient Medications  Medication Sig Dispense Refill   acetaminophen (TYLENOL)  500 MG tablet Take 1 tablet (500 mg total) by mouth every 6 (six) hours as needed for moderate pain or mild pain. (Patient taking differently: Take 500 mg by mouth as needed for moderate pain (pain score 4-6) or mild pain (pain score 1-3).) 30 tablet 0   amiodarone (PACERONE) 200 MG tablet Take 2 tabs twice daily x 7 days until 12/15/23 and from 12/16/23 take 1 tab daily 30 tablet 0   ammonium lactate (LAC-HYDRIN) 12 % lotion Apply 1 Application topically as needed.     bisacodyl (DULCOLAX) 10 MG suppository Place 1 suppository (10 mg total) rectally daily as needed for moderate constipation. 12 suppository 0   carbidopa-levodopa (SINEMET CR) 50-200 MG tablet TAKE 1 TABLET AT BEDTIME 90 tablet 3   carbidopa-levodopa (SINEMET IR) 25-100 MG tablet Take 3 tablets at 7 AM, 2 tablets at 11 AM, 3 tablets at 3 PM, 2 tablets at 7 PM 900 tablet 0   ciclopirox (PENLAC) 8 % solution Apply 1 Application topically once a week.     clonazePAM (KLONOPIN) 0.5 MG tablet Take 1 tablet (0.5 mg total) by mouth at bedtime. 5 tablet 0   cyanocobalamin (VITAMIN B12) 1000 MCG tablet Take 1,000 mcg by mouth daily.     ELIQUIS 5 MG TABS tablet TAKE 1 TABLET TWICE A DAY 120 tablet 5   furosemide (LASIX) 20 MG tablet Take 1 tablet (20 mg total) by mouth daily as needed for fluid or edema. 30 tablet 0   Glycerin-Hypromellose-PEG 400 (VISINE DRY EYE OP) Place 1 drop into both eyes 2 (two) times daily as needed (eye irritation).      ketoconazole (  NIZORAL) 2 % shampoo Apply 1 application  topically 3 (three) times a week.     leuprolide, 6 Month, (ELIGARD) 45 MG injection Inject 45 mg into the skin every 6 (six) months.     lidocaine-prilocaine (EMLA) cream Apply to port site 1 to 2 hours prior to appointments/treatment 30 g 3   metoprolol succinate (TOPROL-XL) 25 MG 24 hr tablet Take 1 tablet (25 mg total) by mouth daily. 30 tablet 0   midodrine (PROAMATINE) 5 MG tablet Take 1 tablet (5 mg total) by mouth 3 (three) times daily with  meals. 90 tablet 0   omeprazole (PRILOSEC) 40 MG capsule Take 40 mg by mouth daily.     ondansetron (ZOFRAN) 8 MG tablet Take 1 tablet (8 mg total) by mouth every 8 (eight) hours as needed for nausea or vomiting. 30 tablet 1   oxyCODONE (OXY IR/ROXICODONE) 5 MG immediate release tablet Take 1 tablet (5 mg total) by mouth every 4 (four) hours as needed for severe pain (pain score 7-10). 90 tablet 0   Pancrelipase, Lip-Prot-Amyl, (PANCREAZE) 37000-97300 units CPEP TAKE 2 CAPSULES WITH BREAKFAST, LUNCH, AND EVENING MEAL (WITH EACH MEAL) AND 1 CAPSULE WITH SNACK 600 capsule 5   polyethylene glycol powder (MIRALAX) 17 GM/SCOOP powder Take 17 g by mouth in the morning, at noon, and at bedtime.     prochlorperazine (COMPAZINE) 10 MG tablet Take 1 tablet (10 mg total) by mouth every 6 (six) hours as needed for nausea or vomiting. 30 tablet 2   rosuvastatin (CRESTOR) 10 MG tablet TAKE 1 TABLET DAILY 90 tablet 1   tiZANidine (ZANAFLEX) 2 MG tablet Take 1 tablet (2 mg total) by mouth every 8 (eight) hours as needed for muscle spasms. 10 tablet 0   triamcinolone cream (KENALOG) 0.1 % Apply 1 application topically 2 (two) times daily. (Patient taking differently: Apply 1 application  topically 2 (two) times daily as needed (itching).) 453.6 g 1   zoledronic acid (RECLAST) 5 MG/100ML SOLN injection Inject 5 mg into the vein See admin instructions. Once a year     No current facility-administered medications for this encounter.    Atrial Fibrillation Management history:  Previous antiarrhythmic drugs: amiodarone  Previous cardioversions: 01/2023, 05/09/23 (ED) Previous ablations: none Anticoagulation history: Eliquis   ROS- All systems are reviewed and negative except as per the HPI above.  Physical Exam: BP 94/80   Pulse 89   Ht 5\' 4"  (1.626 m)   Wt 59.5 kg   BMI 22.52 kg/m   GEN: Well nourished, well developed in no acute distress CARDIAC: Irregularly irregular rate and rhythm, no murmurs, rubs,  gallops RESPIRATORY:  Clear to auscultation without rales, wheezing or rhonchi  ABDOMEN: Soft, non-tender, non-distended EXTREMITIES:  No edema; No deformity    EKG today demonstrates  Atypical atrial flutter with variable block, RBBB Vent. rate 89 BPM PR interval * ms QRS duration 138 ms QT/QTcB 450/547 ms   Echocardiogram 12/08/23  1. Left ventricular ejection fraction, by estimation, is 35 to 40%. The  left ventricle has moderately decreased function. The left ventricle  demonstrates global hypokinesis. There is moderate left ventricular  hypertrophy. Left ventricular diastolic parameters are indeterminate.   2. Right ventricular systolic function is mildly reduced. The right  ventricular size is mildly enlarged.   3. Left atrial size was severely dilated.   4. Right atrial size was severely dilated.   5. The mitral valve is normal in structure. Mild mitral valve  regurgitation.   6.  The aortic valve is tricuspid. Aortic valve regurgitation is mild. No  aortic stenosis is present.    CHA2DS2-VASc Score = 4  The patient's score is based upon: CHF History: 1 HTN History: 0 Diabetes History: 0 Stroke History: 0 Vascular Disease History: 1 Age Score: 2 Gender Score: 0       ASSESSMENT AND PLAN: Persistent Atrial Fibrillation/atrial flutter The patient's CHA2DS2-VASc score is 4, indicating a 4.8% annual risk of stroke.   Patient remains in atrial flutter, rates better controlled.  He is currently loading on amiodarone. After discussing the risks and benefits, will plan for DCCV after complete amiodarone loading. Recent labs reviewed.  Continue Eliquis 5 mg BID Continue Toprol 25 mg daily Continue amiodarone 200 mg daily  Secondary Hypercoagulable State (ICD10:  D68.69) The patient is at significant risk for stroke/thromboembolism based upon his CHA2DS2-VASc Score of 4.  Continue Apixaban (Eliquis). No bleeding issues.   High Risk Medication Monitoring (ICD 10:  Z79.899) Intervals on ECG acceptable for amiodarone monitoring in setting of RBBB.   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  GDMT per primary cardiology team Fluid status appears stable today Will need repeat echo once he is back in SR to reevaluate LVEF.   OSA  Encouraged nightly BiPap   Follow up in the AF clinic post DCCV.    Informed Consent   Shared Decision Making/Informed Consent The risks (stroke, cardiac arrhythmias rarely resulting in the need for a temporary or permanent pacemaker, skin irritation or burns and complications associated with conscious sedation including aspiration, arrhythmia, respiratory failure and death), benefits (restoration of normal sinus rhythm) and alternatives of a direct current cardioversion were explained in detail to Mr. Utsey and he agrees to proceed.       Myrtha Ates PA-C [facility] [address] [place], [place] [address] [phone]

## 2023-12-24 ENCOUNTER — Ambulatory Visit (HOSPITAL_COMMUNITY)

## 2023-12-24 DIAGNOSIS — R2689 Other abnormalities of gait and mobility: Secondary | ICD-10-CM | POA: Diagnosis not present

## 2023-12-24 DIAGNOSIS — M6281 Muscle weakness (generalized): Secondary | ICD-10-CM | POA: Diagnosis not present

## 2023-12-27 DIAGNOSIS — M6281 Muscle weakness (generalized): Secondary | ICD-10-CM | POA: Diagnosis not present

## 2023-12-27 DIAGNOSIS — R2689 Other abnormalities of gait and mobility: Secondary | ICD-10-CM | POA: Diagnosis not present

## 2023-12-28 ENCOUNTER — Other Ambulatory Visit: Payer: Self-pay

## 2023-12-28 ENCOUNTER — Telehealth: Payer: Self-pay

## 2023-12-28 ENCOUNTER — Emergency Department (HOSPITAL_BASED_OUTPATIENT_CLINIC_OR_DEPARTMENT_OTHER)
Admission: EM | Admit: 2023-12-28 | Discharge: 2023-12-28 | Disposition: A | Discharge status: Home / Self Care | Source: Home / Self Care | Attending: Emergency Medicine | Admitting: Emergency Medicine

## 2023-12-28 ENCOUNTER — Encounter (HOSPITAL_BASED_OUTPATIENT_CLINIC_OR_DEPARTMENT_OTHER): Payer: Self-pay | Admitting: Emergency Medicine

## 2023-12-28 ENCOUNTER — Ambulatory Visit: Admitting: Nurse Practitioner

## 2023-12-28 ENCOUNTER — Emergency Department (HOSPITAL_BASED_OUTPATIENT_CLINIC_OR_DEPARTMENT_OTHER): Admitting: Radiology

## 2023-12-28 DIAGNOSIS — D630 Anemia in neoplastic disease: Secondary | ICD-10-CM | POA: Diagnosis present

## 2023-12-28 DIAGNOSIS — I4891 Unspecified atrial fibrillation: Secondary | ICD-10-CM

## 2023-12-28 DIAGNOSIS — Z87891 Personal history of nicotine dependence: Secondary | ICD-10-CM | POA: Diagnosis not present

## 2023-12-28 DIAGNOSIS — Z7189 Other specified counseling: Secondary | ICD-10-CM | POA: Diagnosis not present

## 2023-12-28 DIAGNOSIS — E860 Dehydration: Secondary | ICD-10-CM | POA: Diagnosis present

## 2023-12-28 DIAGNOSIS — C259 Malignant neoplasm of pancreas, unspecified: Secondary | ICD-10-CM | POA: Diagnosis not present

## 2023-12-28 DIAGNOSIS — R4702 Dysphasia: Secondary | ICD-10-CM | POA: Diagnosis not present

## 2023-12-28 DIAGNOSIS — Z7901 Long term (current) use of anticoagulants: Secondary | ICD-10-CM | POA: Insufficient documentation

## 2023-12-28 DIAGNOSIS — R069 Unspecified abnormalities of breathing: Secondary | ICD-10-CM | POA: Diagnosis not present

## 2023-12-28 DIAGNOSIS — N179 Acute kidney failure, unspecified: Secondary | ICD-10-CM | POA: Diagnosis present

## 2023-12-28 DIAGNOSIS — R0602 Shortness of breath: Secondary | ICD-10-CM

## 2023-12-28 DIAGNOSIS — R531 Weakness: Secondary | ICD-10-CM | POA: Diagnosis not present

## 2023-12-28 DIAGNOSIS — R630 Anorexia: Secondary | ICD-10-CM | POA: Diagnosis not present

## 2023-12-28 DIAGNOSIS — R000 Tachycardia, unspecified: Secondary | ICD-10-CM | POA: Diagnosis not present

## 2023-12-28 DIAGNOSIS — E46 Unspecified protein-calorie malnutrition: Secondary | ICD-10-CM | POA: Diagnosis present

## 2023-12-28 DIAGNOSIS — G20B1 Parkinson's disease with dyskinesia, without mention of fluctuations: Secondary | ICD-10-CM | POA: Diagnosis not present

## 2023-12-28 DIAGNOSIS — M8000XA Age-related osteoporosis with current pathological fracture, unspecified site, initial encounter for fracture: Secondary | ICD-10-CM | POA: Diagnosis not present

## 2023-12-28 DIAGNOSIS — R530 Neoplastic (malignant) related fatigue: Secondary | ICD-10-CM | POA: Diagnosis not present

## 2023-12-28 DIAGNOSIS — C7801 Secondary malignant neoplasm of right lung: Secondary | ICD-10-CM | POA: Diagnosis not present

## 2023-12-28 DIAGNOSIS — G20C Parkinsonism, unspecified: Secondary | ICD-10-CM | POA: Diagnosis not present

## 2023-12-28 DIAGNOSIS — Z681 Body mass index (BMI) 19 or less, adult: Secondary | ICD-10-CM | POA: Diagnosis not present

## 2023-12-28 DIAGNOSIS — L905 Scar conditions and fibrosis of skin: Secondary | ICD-10-CM | POA: Diagnosis not present

## 2023-12-28 DIAGNOSIS — I10 Essential (primary) hypertension: Secondary | ICD-10-CM | POA: Diagnosis not present

## 2023-12-28 DIAGNOSIS — I499 Cardiac arrhythmia, unspecified: Secondary | ICD-10-CM | POA: Diagnosis not present

## 2023-12-28 DIAGNOSIS — Z515 Encounter for palliative care: Secondary | ICD-10-CM | POA: Diagnosis not present

## 2023-12-28 DIAGNOSIS — Z7401 Bed confinement status: Secondary | ICD-10-CM | POA: Diagnosis not present

## 2023-12-28 DIAGNOSIS — R0989 Other specified symptoms and signs involving the circulatory and respiratory systems: Secondary | ICD-10-CM | POA: Diagnosis not present

## 2023-12-28 DIAGNOSIS — R1310 Dysphagia, unspecified: Secondary | ICD-10-CM | POA: Diagnosis present

## 2023-12-28 DIAGNOSIS — I4892 Unspecified atrial flutter: Secondary | ICD-10-CM | POA: Diagnosis not present

## 2023-12-28 DIAGNOSIS — N401 Enlarged prostate with lower urinary tract symptoms: Secondary | ICD-10-CM | POA: Diagnosis not present

## 2023-12-28 DIAGNOSIS — J811 Chronic pulmonary edema: Secondary | ICD-10-CM | POA: Diagnosis not present

## 2023-12-28 DIAGNOSIS — F0280 Dementia in other diseases classified elsewhere without behavioral disturbance: Secondary | ICD-10-CM | POA: Diagnosis present

## 2023-12-28 DIAGNOSIS — L853 Xerosis cutis: Secondary | ICD-10-CM | POA: Diagnosis not present

## 2023-12-28 DIAGNOSIS — I5023 Acute on chronic systolic (congestive) heart failure: Secondary | ICD-10-CM | POA: Diagnosis not present

## 2023-12-28 DIAGNOSIS — D229 Melanocytic nevi, unspecified: Secondary | ICD-10-CM | POA: Diagnosis not present

## 2023-12-28 DIAGNOSIS — G4733 Obstructive sleep apnea (adult) (pediatric): Secondary | ICD-10-CM | POA: Diagnosis present

## 2023-12-28 DIAGNOSIS — Z9221 Personal history of antineoplastic chemotherapy: Secondary | ICD-10-CM | POA: Diagnosis not present

## 2023-12-28 DIAGNOSIS — G893 Neoplasm related pain (acute) (chronic): Secondary | ICD-10-CM | POA: Diagnosis not present

## 2023-12-28 DIAGNOSIS — R627 Adult failure to thrive: Secondary | ICD-10-CM | POA: Diagnosis not present

## 2023-12-28 DIAGNOSIS — R918 Other nonspecific abnormal finding of lung field: Secondary | ICD-10-CM | POA: Diagnosis not present

## 2023-12-28 DIAGNOSIS — L814 Other melanin hyperpigmentation: Secondary | ICD-10-CM | POA: Diagnosis not present

## 2023-12-28 DIAGNOSIS — C250 Malignant neoplasm of head of pancreas: Secondary | ICD-10-CM | POA: Diagnosis not present

## 2023-12-28 DIAGNOSIS — C61 Malignant neoplasm of prostate: Secondary | ICD-10-CM | POA: Diagnosis not present

## 2023-12-28 DIAGNOSIS — C7951 Secondary malignant neoplasm of bone: Secondary | ICD-10-CM | POA: Diagnosis not present

## 2023-12-28 DIAGNOSIS — C7800 Secondary malignant neoplasm of unspecified lung: Secondary | ICD-10-CM | POA: Diagnosis not present

## 2023-12-28 DIAGNOSIS — G20A1 Parkinson's disease without dyskinesia, without mention of fluctuations: Secondary | ICD-10-CM | POA: Diagnosis not present

## 2023-12-28 DIAGNOSIS — J90 Pleural effusion, not elsewhere classified: Secondary | ICD-10-CM | POA: Diagnosis not present

## 2023-12-28 DIAGNOSIS — R059 Cough, unspecified: Secondary | ICD-10-CM | POA: Insufficient documentation

## 2023-12-28 DIAGNOSIS — Z95828 Presence of other vascular implants and grafts: Secondary | ICD-10-CM | POA: Diagnosis not present

## 2023-12-28 DIAGNOSIS — Z923 Personal history of irradiation: Secondary | ICD-10-CM | POA: Diagnosis not present

## 2023-12-28 DIAGNOSIS — Z66 Do not resuscitate: Secondary | ICD-10-CM | POA: Diagnosis not present

## 2023-12-28 DIAGNOSIS — G4730 Sleep apnea, unspecified: Secondary | ICD-10-CM | POA: Diagnosis not present

## 2023-12-28 DIAGNOSIS — I509 Heart failure, unspecified: Secondary | ICD-10-CM | POA: Diagnosis not present

## 2023-12-28 DIAGNOSIS — I110 Hypertensive heart disease with heart failure: Secondary | ICD-10-CM | POA: Diagnosis not present

## 2023-12-28 DIAGNOSIS — R64 Cachexia: Secondary | ICD-10-CM | POA: Diagnosis present

## 2023-12-28 DIAGNOSIS — I480 Paroxysmal atrial fibrillation: Secondary | ICD-10-CM | POA: Diagnosis present

## 2023-12-28 DIAGNOSIS — I959 Hypotension, unspecified: Secondary | ICD-10-CM | POA: Diagnosis present

## 2023-12-28 DIAGNOSIS — N400 Enlarged prostate without lower urinary tract symptoms: Secondary | ICD-10-CM | POA: Diagnosis present

## 2023-12-28 DIAGNOSIS — K5900 Constipation, unspecified: Secondary | ICD-10-CM | POA: Diagnosis not present

## 2023-12-28 DIAGNOSIS — I482 Chronic atrial fibrillation, unspecified: Secondary | ICD-10-CM | POA: Diagnosis not present

## 2023-12-28 DIAGNOSIS — D649 Anemia, unspecified: Secondary | ICD-10-CM | POA: Diagnosis not present

## 2023-12-28 LAB — COMPREHENSIVE METABOLIC PANEL WITH GFR
ALT: 16 U/L (ref 0–44)
AST: 120 U/L — ABNORMAL HIGH (ref 15–41)
Albumin: 3.6 g/dL (ref 3.5–5.0)
Alkaline Phosphatase: 100 U/L (ref 38–126)
Anion gap: 11 (ref 5–15)
BUN: 27 mg/dL — ABNORMAL HIGH (ref 8–23)
CO2: 22 mmol/L (ref 22–32)
Calcium: 9.1 mg/dL (ref 8.9–10.3)
Chloride: 104 mmol/L (ref 98–111)
Creatinine, Ser: 0.94 mg/dL (ref 0.61–1.24)
GFR, Estimated: >60 mL/min (ref >60)
Glucose, Bld: 100 mg/dL — ABNORMAL HIGH (ref 70–99)
Potassium: 3.9 mmol/L (ref 3.5–5.1)
Sodium: 137 mmol/L (ref 135–145)
Total Bilirubin: 0.8 mg/dL (ref 0.0–1.2)
Total Protein: 5.9 g/dL — ABNORMAL LOW (ref 6.5–8.1)

## 2023-12-28 LAB — CBC
HCT: 27.7 % — ABNORMAL LOW (ref 39.0–52.0)
Hemoglobin: 8.8 g/dL — ABNORMAL LOW (ref 13.0–17.0)
MCH: 30.7 pg (ref 26.0–34.0)
MCHC: 31.8 g/dL (ref 30.0–36.0)
MCV: 96.5 fL (ref 80.0–100.0)
Platelets: 139 10*3/uL — ABNORMAL LOW (ref 150–400)
RBC: 2.87 MIL/uL — ABNORMAL LOW (ref 4.22–5.81)
RDW: 19.8 % — ABNORMAL HIGH (ref 11.5–15.5)
WBC: 5.7 10*3/uL (ref 4.0–10.5)
nRBC: 0 % (ref 0.0–0.2)

## 2023-12-28 LAB — TROPONIN T, HIGH SENSITIVITY: Troponin T High Sensitivity: 21 ng/L — ABNORMAL HIGH (ref <19)

## 2023-12-28 LAB — PRO BRAIN NATRIURETIC PEPTIDE: Pro Brain Natriuretic Peptide: 10756 pg/mL — ABNORMAL HIGH (ref <300.0)

## 2023-12-28 MED ORDER — HEPARIN SOD (PORK) LOCK FLUSH 100 UNIT/ML IV SOLN
500.0000 [IU] | Freq: Once | INTRAVENOUS | Status: AC
  Administered 2023-12-28: 500 [IU]
  Filled 2023-12-28: qty 5

## 2023-12-28 MED ORDER — SODIUM CHLORIDE 0.9 % IV BOLUS
500.0000 mL | Freq: Once | INTRAVENOUS | Status: AC
  Administered 2023-12-28: 500 mL via INTRAVENOUS

## 2023-12-28 NOTE — ED Notes (Signed)
 Pt o2 remained between 90-94% and Hr 101-107 while ambulating in room.

## 2023-12-28 NOTE — Discharge Instructions (Signed)
 As was discussed with Dr. Florie Husband in the ED, your preference is for discharge home. Please contact your cardiologist about today's visit so they can review and direct follow up care.   If your symptoms change or worsen, please return to the emergency department for further management.

## 2023-12-28 NOTE — Telephone Encounter (Signed)
 Received telephone call from Robie Cho, PA at [facility]. Bridgette Campus seen the patient in clinic today with his son, Marjo Sievert and daughter [name] on the phone. Bridgette Campus calling to make Dr. Maryalice Smaller aware that patient was sent to ED for poor PO intake x 3 days, dehydration, difficulty swallowing, poor skin color, weakness.  Bridgette Campus states patient is wanting to discuss hospice. Bridgette Campus may be reached at [phone]. Let Bridgette Campus know that I would forward her message to Dr. Maryalice Smaller.

## 2023-12-28 NOTE — ED Triage Notes (Signed)
 Weakness and sob,  "Fluid in the lung" Staretd 3 days ago  Worse this AM Abdo discomfort

## 2023-12-28 NOTE — ED Provider Notes (Signed)
 [place] EMERGENCY DEPARTMENT AT [facility] Provider Note   CSN: [id number] Arrival date & time: 12/28/23  1337     History  Chief Complaint  Patient presents with   Shortness of Breath    [name] [name] is a 83 y.o. male.  Patient to ED complains of SOB, generalized weakness. He states he believes the SOB is caused by abdominal swelling, which has been going on for months and is worse in the mornings. History of pancreatic cancer that he is receiving chemo via port in right chest. No fever. He reports cough without chest pain, sore throat or congestion. He does not feel his legs have been swelling.   The history is provided by the patient and a relative. No language interpreter was used.  Shortness of Breath Severity:  Moderate Onset quality:  Gradual Duration:  4 days Timing:  Sporadic      Home Medications Prior to Admission medications   Medication Sig Start Date End Date Taking? Authorizing Provider  acetaminophen  (TYLENOL ) 500 MG tablet Take 1 tablet (500 mg total) by mouth every 6 (six) hours as needed for moderate pain or mild pain. Patient taking differently: Take 500 mg by mouth as needed for moderate pain (pain score 4-6) or mild pain (pain score 1-3). 04/18/22   Mansouraty, Albino Alu., MD  amiodarone  (PACERONE ) 200 MG tablet Take 2 tabs twice daily x 7 days until 12/15/23 and from 12/16/23 take 1 tab daily 12/09/23   Lesa Rape, MD  ammonium lactate (LAC-HYDRIN) 12 % lotion Apply 1 Application topically as needed. 06/22/23   [provider]  bisacodyl  (DULCOLAX) 10 MG suppository Place 1 suppository (10 mg total) rectally daily as needed for moderate constipation. 02/09/23   Aura Leeds Latif, DO  carbidopa -levodopa  (SINEMET  CR) 50-200 MG tablet TAKE 1 TABLET AT BEDTIME 10/18/23   Tat, Rebecca S, DO  carbidopa -levodopa  (SINEMET  IR) 25-100 MG tablet Take 3 tablets at 7 AM, 2 tablets at 11 AM, 3 tablets at 3 PM, 2 tablets at 7 PM 10/26/23   Tat, Rebecca  S, DO  ciclopirox (PENLAC) 8 % solution Apply 1 Application topically once a week.  09/29/24  [provider]  clonazePAM  (KLONOPIN ) 0.5 MG tablet Take 1 tablet (0.5 mg total) by mouth at bedtime. 02/09/23   Aura Leeds Latif, DO  cyanocobalamin  (VITAMIN B12) 1000 MCG tablet Take 1,000 mcg by mouth daily.    [provider]  ELIQUIS  5 MG TABS tablet TAKE 1 TABLET TWICE A DAY 03/12/23   Tolia, Sunit, DO  furosemide  (LASIX ) 20 MG tablet Take 1 tablet (20 mg total) by mouth daily as needed for fluid or edema. 12/09/23 01/08/24  Lesa Rape, MD  Glycerin -Hypromellose-PEG 400 (VISINE DRY EYE OP) Place 1 drop into both eyes 2 (two) times daily as needed (eye irritation).     [provider]  ketoconazole (NIZORAL) 2 % shampoo Apply 1 application  topically 3 (three) times a week. 02/15/19   [provider]  leuprolide , 6 Month, (ELIGARD ) 45 MG injection Inject 45 mg into the skin every 6 (six) months.    [provider]  lidocaine -prilocaine  (EMLA ) cream Apply to port site 1 to 2 hours prior to appointments/treatment 08/13/23   Sharyon Deis, NP  metoprolol  succinate (TOPROL -XL) 25 MG 24 hr tablet Take 1 tablet (25 mg total) by mouth daily. 12/10/23 01/09/24  Lesa Rape, MD  midodrine  (PROAMATINE ) 5 MG tablet Take 1 tablet (5 mg total) by mouth 3 (three) times  daily with meals. 12/09/23 01/08/24  Lesa Rape, MD  omeprazole  (PRILOSEC) 40 MG capsule Take 40 mg by mouth daily. 12/01/23   [provider]  ondansetron  (ZOFRAN ) 8 MG tablet Take 1 tablet (8 mg total) by mouth every 8 (eight) hours as needed for nausea or vomiting. 11/04/23   Sonja Palmer, MD  oxyCODONE  (OXY IR/ROXICODONE ) 5 MG immediate release tablet Take 1 tablet (5 mg total) by mouth every 4 (four) hours as needed for severe pain (pain score 7-10). 12/16/23   Pickenpack-Cousar, Giles Labrum, NP  Pancrelipase , Lip-Prot-Amyl, (PANCREAZE ) [phone] units CPEP TAKE 2 CAPSULES WITH BREAKFAST, LUNCH, AND EVENING MEAL  (WITH EACH MEAL) AND 1 CAPSULE WITH SNACK 11/13/23   Kennedy-Smith, Colleen M, NP  polyethylene glycol powder (MIRALAX ) 17 GM/SCOOP powder Take 17 g by mouth in the morning, at noon, and at bedtime.    [provider]  prochlorperazine  (COMPAZINE ) 10 MG tablet Take 1 tablet (10 mg total) by mouth every 6 (six) hours as needed for nausea or vomiting. 09/23/23   Burton, Lacie K, NP  rosuvastatin  (CRESTOR ) 10 MG tablet TAKE 1 TABLET DAILY 07/08/23   Tolia, Sunit, DO  tiZANidine  (ZANAFLEX ) 2 MG tablet Take 1 tablet (2 mg total) by mouth every 8 (eight) hours as needed for muscle spasms. 02/09/23   Sheikh, Omair Latif, DO  triamcinolone  cream (KENALOG ) 0.1 % Apply 1 application topically 2 (two) times daily. Patient taking differently: Apply 1 application  topically 2 (two) times daily as needed (itching). 01/16/21   Clarise Crooks, MD  zoledronic  acid (RECLAST ) 5 MG/100ML SOLN injection Inject 5 mg into the vein See admin instructions. Once a year    [provider]      Allergies    Influenza vaccines and Influenza virus vaccine    Review of Systems   Review of Systems  Respiratory:  Positive for shortness of breath.     Physical Exam Updated Vital Signs BP (!) 121/100 (BP Location: Right Arm)   Pulse 70   Temp 97.8 F (36.6 C) (Oral)   Resp (!) 26   SpO2 95%  Physical Exam Vitals and nursing note reviewed.  Constitutional:      Appearance: He is well-developed. He is ill-appearing. He is not toxic-appearing.     Comments: Frail 82-yo patient  HENT:     Head: Normocephalic.  Eyes:     Comments: Conjunctival pallor.   Cardiovascular:     Rate and Rhythm: Tachycardia present. Rhythm irregular.     Heart sounds: No murmur heard. Pulmonary:     Effort: Pulmonary effort is normal.     Breath sounds: Examination of the right-lower field reveals rales. Examination of the left-lower field reveals rales. Rales present. No wheezing or rhonchi.  Abdominal:     General: Bowel  sounds are normal.     Palpations: Abdomen is soft.     Tenderness: There is no abdominal tenderness. There is no guarding or rebound.  Musculoskeletal:        General: Normal range of motion.     Cervical back: Normal range of motion and neck supple.     Right lower leg: No edema.     Left lower leg: No edema.     Comments: Compression hose in place bilaterally  Skin:    General: Skin is warm and dry.     Coloration: Skin is pale.  Neurological:     General: No focal deficit present.     Mental Status: He  is alert and oriented to person, place, and time.     ED Results / Procedures / Treatments   Labs (all labs ordered are listed, but only abnormal results are displayed) Labs Reviewed  CBC - Abnormal; Notable for the following components:      Result Value   RBC 2.87 (*)    Hemoglobin 8.8 (*)    HCT 27.7 (*)    RDW 19.8 (*)    Platelets 139 (*)    All other components within normal limits  COMPREHENSIVE METABOLIC PANEL WITH GFR - Abnormal; Notable for the following components:   Glucose, Bld 100 (*)    BUN 27 (*)    Total Protein 5.9 (*)    AST 120 (*)    All other components within normal limits  PRO BRAIN NATRIURETIC PEPTIDE - Abnormal; Notable for the following components:   Pro Brain Natriuretic Peptide 10,756.0 (*)    All other components within normal limits  TROPONIN T, HIGH SENSITIVITY - Abnormal; Notable for the following components:   Troponin T High Sensitivity 21 (*)    All other components within normal limits   Results for orders placed or performed during the hospital encounter of 12/28/23  CBC   Collection Time: 12/28/23  3:32 PM  Result Value Ref Range   WBC 5.7 4.0 - 10.5 K/uL   RBC 2.87 (L) 4.22 - 5.81 MIL/uL   Hemoglobin 8.8 (L) 13.0 - 17.0 g/dL   HCT 16.1 (L) 09.6 - 04.5 %   MCV 96.5 80.0 - 100.0 fL   MCH 30.7 26.0 - 34.0 pg   MCHC 31.8 30.0 - 36.0 g/dL   RDW 40.9 (H) 81.1 - 91.4 %   Platelets 139 (L) 150 - 400 K/uL   nRBC 0.0 0.0 - 0.2 %   Comprehensive metabolic panel   Collection Time: 12/28/23  3:32 PM  Result Value Ref Range   Sodium 137 135 - 145 mmol/L   Potassium 3.9 3.5 - 5.1 mmol/L   Chloride 104 98 - 111 mmol/L   CO2 22 22 - 32 mmol/L   Glucose, Bld 100 (H) 70 - 99 mg/dL   BUN 27 (H) 8 - 23 mg/dL   Creatinine, Ser 7.82 0.61 - 1.24 mg/dL   Calcium  9.1 8.9 - 10.3 mg/dL   Total Protein 5.9 (L) 6.5 - 8.1 g/dL   Albumin  3.6 3.5 - 5.0 g/dL   AST 956 (H) 15 - 41 U/L   ALT 16 0 - 44 U/L   Alkaline Phosphatase 100 38 - 126 U/L   Total Bilirubin 0.8 0.0 - 1.2 mg/dL   GFR, Estimated >21 >30 mL/min   Anion gap 11 5 - 15  Pro Brain natriuretic peptide   Collection Time: 12/28/23  3:32 PM  Result Value Ref Range   Pro Brain Natriuretic Peptide 10,756.0 (H) <300.0 pg/mL  Troponin T, High Sensitivity   Collection Time: 12/28/23  3:32 PM  Result Value Ref Range   Troponin T High Sensitivity 21 (H) <19 ng/L     EKG EKG Interpretation Date/Time:  Tuesday December 28 2023 13:57:19 EDT Ventricular Rate:  98 PR Interval:    QRS Duration:  132 QT Interval:  412 QTC Calculation: 525 R Axis:   -51  Text Interpretation: Atrial flutter with variable A-V block 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 Heart upper limits normal in size. Mediastinal contours within normal limits. No confluent airspace opacities or effusions. No acute bony abnormality. IMPRESSION: No active cardiopulmonary disease. Electronically Signed   By: Janeece Mechanic M.D.   On: 12/28/2023 15:00    Procedures Procedures    Medications Ordered in ED Medications  sodium  chloride 0.9 % bolus 500 mL ( Intravenous Stopped 12/28/23 1635)  heparin  lock flush 100 unit/mL (500 Units Intracatheter Given 12/28/23 1844)    ED Course/ Medical Decision Making/ A&P                                 Medical Decision Making This patient presents to the ED for concern of SOB, this involves an extensive number of treatment options, and is a complaint that carries with it a high risk of complications and morbidity.  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  The pertinent results include:  BNP 10,450 (last value >7000); Hgb 8.8 (h/o anemia); no leukocytosis; No significant electrolyte abnormalities    Imaging Studies ordered:  I ordered imaging studies including CXR  I independently visualized and interpreted imaging which showed no edema I agree with the radiologist interpretation   Cardiac Monitoring:  The patient was maintained on a cardiac monitor.  I personally viewed and interpreted the cardiac monitored which showed an underlying rhythm of: atrial flutter, rate 98   Test Considered:  N/a   Critical Interventions:  N/a   Consultations Obtained:  I requested consultation with the n/a,  and discussed lab and imaging findings as well as pertinent plan - they recommend: n/q   Problem List / ED Course:  Here with progressive SOB, h/o pancreatic cancer with abdominal swelling, h/o CHF, h/o atrial fib/flutter No fever, cough Labs reassuring.  Seen by Dr. Florie Husband and offered admission but patient declined.  Symptoms of SOB  felt to be secondary to atrial fibrillation, which is flutter on EKG now. Scheduled for cardioversion 5/12 with cardiology He is discharged home per his request. Encouraged to follow up with cardiology.    Reevaluation:  After the interventions noted above, I reevaluated the patient and found that they have :stayed the same   Social Determinants of Health:  Lives with family   Disposition:  After consideration of the diagnostic results and the patients response to treatment, I feel that the patient would benefit from discharge home.   Amount and/or Complexity of Data Reviewed Labs: ordered. Radiology: ordered.  Risk Prescription drug management.           Final Clinical Impression(s) / ED Diagnoses Final diagnoses:  Shortness of breath  Atrial fibrillation/flutter [facility])    Rx / DC Orders ED Discharge Orders     None         Mandy Second, PA-C 12/29/23 0055    Afton Horse T, DO 01/01/24 1547

## 2023-12-30 ENCOUNTER — Inpatient Hospital Stay: Payer: Medicare Other

## 2023-12-30 ENCOUNTER — Encounter: Payer: Self-pay | Admitting: Hematology

## 2023-12-30 ENCOUNTER — Encounter: Payer: Self-pay | Admitting: Nurse Practitioner

## 2023-12-30 ENCOUNTER — Inpatient Hospital Stay (HOSPITAL_BASED_OUTPATIENT_CLINIC_OR_DEPARTMENT_OTHER): Admitting: Nurse Practitioner

## 2023-12-30 ENCOUNTER — Inpatient Hospital Stay (HOSPITAL_BASED_OUTPATIENT_CLINIC_OR_DEPARTMENT_OTHER): Payer: Medicare Other | Admitting: Hematology

## 2023-12-30 VITALS — BP 100/68 | HR 69 | Temp 98.1°F | Resp 21 | Ht 64.0 in | Wt 133.2 lb

## 2023-12-30 DIAGNOSIS — G893 Neoplasm related pain (acute) (chronic): Secondary | ICD-10-CM | POA: Diagnosis not present

## 2023-12-30 DIAGNOSIS — C259 Malignant neoplasm of pancreas, unspecified: Secondary | ICD-10-CM

## 2023-12-30 DIAGNOSIS — R630 Anorexia: Secondary | ICD-10-CM

## 2023-12-30 DIAGNOSIS — Z515 Encounter for palliative care: Secondary | ICD-10-CM

## 2023-12-30 DIAGNOSIS — Z95828 Presence of other vascular implants and grafts: Secondary | ICD-10-CM | POA: Diagnosis not present

## 2023-12-30 DIAGNOSIS — R530 Neoplastic (malignant) related fatigue: Secondary | ICD-10-CM | POA: Diagnosis not present

## 2023-12-30 DIAGNOSIS — K5900 Constipation, unspecified: Secondary | ICD-10-CM

## 2023-12-30 DIAGNOSIS — C250 Malignant neoplasm of head of pancreas: Secondary | ICD-10-CM

## 2023-12-30 LAB — CMP (CANCER CENTER ONLY)
ALT: 12 U/L (ref 0–44)
AST: 78 U/L — ABNORMAL HIGH (ref 15–41)
Albumin: 3.6 g/dL (ref 3.5–5.0)
Alkaline Phosphatase: 102 U/L (ref 38–126)
Anion gap: 4 — ABNORMAL LOW (ref 5–15)
BUN: 30 mg/dL — ABNORMAL HIGH (ref 8–23)
CO2: 26 mmol/L (ref 22–32)
Calcium: 9 mg/dL (ref 8.9–10.3)
Chloride: 109 mmol/L (ref 98–111)
Creatinine: 0.91 mg/dL (ref 0.61–1.24)
GFR, Estimated: >60 mL/min (ref >60)
Glucose, Bld: 140 mg/dL — ABNORMAL HIGH (ref 70–99)
Potassium: 4 mmol/L (ref 3.5–5.1)
Sodium: 139 mmol/L (ref 135–145)
Total Bilirubin: 1 mg/dL (ref 0.0–1.2)
Total Protein: 6.3 g/dL — ABNORMAL LOW (ref 6.5–8.1)

## 2023-12-30 LAB — CBC WITH DIFFERENTIAL (CANCER CENTER ONLY)
Abs Immature Granulocytes: 0.04 10*3/uL (ref 0.00–0.07)
Basophils Absolute: 0 10*3/uL (ref 0.0–0.1)
Basophils Relative: 1 %
Eosinophils Absolute: 0.1 10*3/uL (ref 0.0–0.5)
Eosinophils Relative: 2 %
HCT: 28 % — ABNORMAL LOW (ref 39.0–52.0)
Hemoglobin: 9.1 g/dL — ABNORMAL LOW (ref 13.0–17.0)
Immature Granulocytes: 1 %
Lymphocytes Relative: 12 %
Lymphs Abs: 0.9 10*3/uL (ref 0.7–4.0)
MCH: 31.1 pg (ref 26.0–34.0)
MCHC: 32.5 g/dL (ref 30.0–36.0)
MCV: 95.6 fL (ref 80.0–100.0)
Monocytes Absolute: 0.8 10*3/uL (ref 0.1–1.0)
Monocytes Relative: 11 %
Neutro Abs: 5.8 10*3/uL (ref 1.7–7.7)
Neutrophils Relative %: 73 %
Platelet Count: 172 10*3/uL (ref 150–400)
RBC: 2.93 MIL/uL — ABNORMAL LOW (ref 4.22–5.81)
RDW: 20.7 % — ABNORMAL HIGH (ref 11.5–15.5)
WBC Count: 7.8 10*3/uL (ref 4.0–10.5)
nRBC: 0 % (ref 0.0–0.2)

## 2023-12-30 MED ORDER — SODIUM CHLORIDE 0.9% FLUSH
10.0000 mL | Freq: Once | INTRAVENOUS | Status: AC
  Administered 2023-12-30: 10 mL via INTRAVENOUS

## 2023-12-30 MED ORDER — HEPARIN SOD (PORK) LOCK FLUSH 100 UNIT/ML IV SOLN
500.0000 [IU] | Freq: Once | INTRAVENOUS | Status: AC
  Administered 2023-12-30: 500 [IU] via INTRAVENOUS

## 2023-12-30 MED ORDER — SODIUM CHLORIDE 0.9% FLUSH
10.0000 mL | Freq: Once | INTRAVENOUS | Status: AC
  Administered 2023-12-30: 10 mL

## 2023-12-30 NOTE — Progress Notes (Signed)
 [facility]   [phone] [phone]   Clinic Follow up Note   Patient Care Team: Arva Lathe, MD as PCP - General (Internal Medicine) Olinda Bertrand, DO as PCP - Cardiology (Cardiology) Tat, Von [place], DO as Consulting Physician (Neurology) Gwyn Leos, MD as Consulting Physician (Hematology and Oncology) Lujean Sake, MD as Consulting Physician (General Surgery) Sonja Addington, MD as Consulting Physician (Hematology and Oncology) Prudy Brownie, DO as Consulting Physician (Pulmonary Disease) Pickenpack-Cousar, Giles Labrum, NP as Nurse Practitioner [facility] and Palliative Medicine)  Date of Service:  12/30/2023  CHIEF COMPLAINT: f/u of pancreatic cancer  CURRENT THERAPY:  Supportive care  Oncology History   Pancreatic cancer [facility]) diagnosed in 09/2021, deemed poor surgical candidate due to his age and medical co-morbidities -S/p single agent gemcitabine  01/13/22 - 04/01/22 and 5 fx SBRT [facility]) 05/04/22 - 05/14/22. CA 19-9 decreased after treatment -Surveillance CT 11/09/2022 showed slightly decreased size of pancreatic head mass, no evidence of metastatic disease.  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  He is taking ibuprofen as needed.  No other clinical concern for cancer progression. -Lab reviewed, overall stable, tumor marker still pending. -He was admitted for right femoral neck fracture  on 02/03/23 after a fall and had surgery  -CT scan from May 17, 2023 showed multiple lung nodules, with dominant 1.5 cm nodule in the right middle lobe, highly concerning for metastasis.  His lung lesions are positive on PET scan. -Bronchoscopy and biopsy attempted, which showed atypical cells. -He restarted chemo gemcitabine  and abraxane  on 07/29/2023. -restaging CT 10/01/2023 showed stable disease  -Due to increased fatigue, chemo changed to single agent gemcitabine  on  October 21, 2023  Assessment & Plan Pancreatic cancer Pancreatic cancer is aggressive. Last scan showed no metastatic disease. Chemotherapy is causing significant fatigue and appetite issues. Decision made to stop chemotherapy temporarily to assess improvement in symptoms and quality of life. Chemotherapy may prolong life but adds fatigue and appetite issues, especially at his age. Consideration of hospice care discussed if not on cancer treatment, but currently not pursuing due to upcoming cardioversion and adequate home support. - Stop chemotherapy - Reassess in one month to evaluate symptoms and potential resumption of chemotherapy vs hospice  - Consider hospice care if symptoms worsen and not pursuing active cancer treatment  Dysphagia Experiencing difficulty swallowing, leading to food collecting in the mouth. Symptoms have improved slightly with dietary modifications. Encouraged to consume liquid and soft foods to ease swallowing. - Continue consuming smoothies and soft foods - Increase intake of liquid nutrition supplements like Premier Protein or Boost to 4-5 bottles per day  Malnutrition Malnutrition due to decreased oral intake and difficulty swallowing. Weight stable at 133 lbs, but previously 139 lbs four weeks ago. Emphasis on increasing nutritional intake to prevent further weight loss. - Increase intake of liquid nutrition supplements to 4-5 bottles per day to prevent further weight loss  Dehydration Recent episode of dehydration requiring IV fluids. Improved hydration status since receiving IV fluids two days ago. Encouraged to maintain adequate oral fluid intake to prevent recurrence. - Encourage adequate oral fluid intake to prevent dehydration - Contact clinic for IV hydration if symptoms of dehydration recur  Atrial fibrillation Atrial fibrillation managed with medication. Heart rate is stable at 69 bpm. Scheduled for cardioversion on May 12, which may improve fatigue.  Cardioversion anticipated to potentially improve overall energy levels. - Proceed with scheduled cardioversion  on May 12  Back pain Intermittent back pain managed with oxycodone , which provides relief. - Continue oxycodone  for back pain management  Plan - Due to his worsening fatigue, will stop chemotherapy for now - He will see symptom medicine clinic at the [facility] [place] today - We discussed home hospice.  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 Sclerotic 1.5 cm left ischial lesion/ Sclerotic medial left iliac bone 1.5 cm lesion (series 2/image 47), increased from 1.0 cm. Gleason score of 4+5= 9; with almost all cores involved greater than 80%.  9/16 Lupron  25-month depot on 9/16. [Urology; Dr.Siniski]  # MID OCT 2020- Zytiga  1000 mg+ prednisone ; stopped December 2021 [poor tolerance if with RVR]; DISCONTINUED.   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       Prostate cancer metastatic to bone (HCC)  05/24/2019 Initial Diagnosis   Prostate cancer metastatic to bone [facility])   Pancreatic cancer (HCC)  10/02/2021 Imaging   CT ABDOMEN PELVIS W CONTRAST   IMPRESSION: 1. There is new pancreatic ductal dilation with an indeterminate 19 mm hypodense low-density mass area in the head/uncinate process of the pancreas. Recommend further evaluation with dedicated MRI/MRCP with contrast. 2. No evidence of bowel obstruction.  Moderate rectal stool ball. 3. Scattered peripherally located clustered pulmonary nodules in the RIGHT middle lobe. Findings are likely infectious or inflammatory  in etiology. Given history of malignancy, recommend follow-up as per clinical protocol.   10/28/2021 Imaging   MR ABDOMEN MRCP W WO CONTAST   IMPRESSION: 1. Examination is significantly limited by breath motion artifact throughout. 2. The main pancreatic duct is diffusely dilated from the level of the superior pancreatic head, measuring up to 0.7 cm. 3. In the inferior pancreatic head and uncinate, there is a multilobulated, fluid signal cystic lesion measuring 1.8 x 1.0 cm. Due to breath motion artifact it is difficult to determine whether this communicates to the adjacent duct. There are multiple additional subcentimeter cystic lesions scattered throughout the pancreas, several of which clearly communicate to the main pancreatic duct. 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 Recommend EUS/FNA for further diagnosis given the presence of pancreatic ductal dilatation. 4. Status post left nephrectomy. 5. No evidence of recurrent or metastatic disease in the abdomen.   12/25/2021 Procedure   UPPER ENDOSCOPIC ULTRASOUND-By Dr. Brice Campi  - A mass-like region was identified in the pancreatic head where the pancreatic duct dilates with multiple cystic regions noted throughout the pancreas as well. 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 However, the endosonographic appearance is suspicious for potential adenocarcinoma. This was staged T2 N0 Mx by endosonographic criteria. T - No malignant-appearing lymph nodes were visualized in the celiac region (level 20), peripancreatic region and porta hepatis region.   12/25/2021 Pathology Results   CYTOLOGY - NON PAP  CASE: [id number]   FINAL MICROSCOPIC DIAGNOSIS:  A. PANCREAS, HEAD, FINE NEEDLE ASPIRATION:  - Malignant cells consistent with adenocarcinoma     01/01/2022 Initial Diagnosis   Pancreatic cancer (HCC)   01/13/2022 - 04/01/2022 Chemotherapy   Patient is  on  Treatment Plan : PANCREAS Gemcitabine  D1,15 q28d x 4 Cycles     01/26/2022 Cancer Staging   Staging form: Exocrine Pancreas, AJCC 8th Edition - Clinical: Stage IB (cT2, cN0, cM0) - Signed by Sonja [place], MD on 01/26/2022 Total positive nodes: 0    Genetic Testing   [facility] CancerNext-Expanded Panel was Negative. Of note, a variant of uncertain significance was identified in the BLM gene (p.N936D) and GALNT12 gene ([id number]). Report date is 01/26/2022.  The CancerNext-Expanded gene panel offered by [facility] and includes sequencing, rearrangement, and RNA analysis for the following 77 genes: AIP, ALK, APC, ATM, AXIN2, BAP1, BARD1, BLM, BMPR1A, BRCA1, BRCA2, BRIP1, CDC73, CDH1, CDK4, CDKN1B, CDKN2A, CHEK2, CTNNA1, DICER1, FANCC, FH, FLCN, GALNT12, KIF1B, LZTR1, MAX, MEN1, MET, MLH1, MSH2, MSH3, MSH6, MUTYH, NBN, NF1, NF2, NTHL1, PALB2, PHOX2B, PMS2, POT1, PRKAR1A, PTCH1, PTEN, RAD51C, RAD51D, RB1, RECQL, RET, SDHA, SDHAF2, SDHB, SDHC, SDHD, SMAD4, SMARCA4, SMARCB1, SMARCE1, STK11, SUFU, TMEM127, TP53, TSC1, TSC2, VHL and XRCC2 (sequencing and deletion/duplication); EGFR, EGLN1, HOXB13, KIT, MITF, PDGFRA, POLD1, and POLE (sequencing only); EPCAM and GREM1 (deletion/duplication only).    08/12/2022 Imaging    IMPRESSION: 1. Slight interval increase in size of the pancreatic head-uncinate process mass with increasing direct contact of the SMA and portal vein as discussed. Also with a replaced RIGHT hepatic artery which passes through the tumor. 2. No signs of acute inflammation currently about the pancreas. With similar appearance of ductal obstruction and peripheral atrophy of pancreas due to the tumor. 3. Post LEFT nephrectomy. 4. Sclerotic bony lesions of the LEFT ischium and LEFT iliac bone similar to prior imaging. 5. Mild hyperenhancement of LEFT prostate 14 mm with asymmetry, nonspecific. This is unchanged in this patient with history of prostate neoplasm. 6. Aortic  atherosclerosis.   07/29/2023 -  Chemotherapy   Patient is on Treatment Plan : PANCREATIC Abraxane  D1,8,15 + Gemcitabine  D1,8,15 q28d     08/03/2023 Imaging   CT chest abdomen pelvis with contrast IMPRESSION: 1. No substantial change in size of heterogeneously enhancing pancreatic head mass with adjacent fiducials. 2. Slightly increased main pancreatic ductal dilation and mild intrahepatic bile duct dilation. 3. No evidence of new or progressive metastatic disease in the abdomen or pelvis. 4. Similar mildly sclerotic focus in the left ischial tuberosity dating back to 01/07/2019. A left iliac bone faintly sclerotic lesion measuring 1.4 cm has decreased in density since 2020, likely treated osseous metastasis. 5. Left lower lobe nodules measuring up to 5 mm are unchanged. 6. Aortic Atherosclerosis (ICD10-I70.0).        Discussed the use of AI scribe software for clinical note transcription with the patient, who gave verbal consent to proceed.  History of Present Illness The patient, an 83 year old with a history of pancreatic cancer, presents with ongoing issues of fatigue, difficulty swallowing, and stomach discomfort. The patient recently visited the emergency room due to severe dehydration, which was managed with IV fluids. The patient reports that he has been struggling with maintaining adequate nutrition due to difficulty swallowing. He describes a lack of desire to swallow food, leading to food collecting in his mouth. Despite these challenges, the patient reports some improvement in his ability to swallow and an increasing appetite. He has been consuming more smoothies, soft foods, and nutritional supplements like Boost and Premier Protein to manage his nutritional needs.  The patient also reports occasional back pain, which is managed with oxycodone . He has been experiencing irregular bowel movements, which he attributes to inadequate fluid intake  and a lapse in his regular regimen of  Miralax . The patient lives independently but receives daily assistance from an aide and has meals provided in a dining hall. He is scheduled for a cardioversion due to atrial fibrillation and is currently on medication for this condition.     All other systems were reviewed with the patient and are negative.  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)    Prostate cancer metastatic to bone (HCC)    REM sleep behavior disorder    Sleep apnea    BiPap - uses nightly    SURGICAL HISTORY: Past Surgical History:  Procedure Laterality Date   ANTERIOR APPROACH HEMI HIP ARTHROPLASTY Right 02/05/2023   Procedure: ANTERIOR APPROACH HEMI HIP ARTHROPLASTY;  Surgeon: Neil Balls, MD;  Location: [facility] OR;  Service: Orthopedics;  Laterality: Right;   APPENDECTOMY  1963   BIOPSY  12/25/2021   Procedure: BIOPSY;  Surgeon: Brice Campi Albino Alu., MD;  Location: [facility] ENDOSCOPY;  Service: Gastroenterology;;   BRONCHIAL BIOPSY  07/05/2023   Procedure: BRONCHIAL BIOPSIES;  Surgeon: Prudy Brownie, DO;  Location: [facility] ENDOSCOPY;  Service: Pulmonary;;   COLONOSCOPY  2010, 2015   ESOPHAGOGASTRODUODENOSCOPY N/A 04/16/2022   Procedure: ESOPHAGOGASTRODUODENOSCOPY (EGD);  Surgeon: Normie Becton., MD;  Location: [facility] ENDOSCOPY;  Service: Gastroenterology;  Laterality: N/A;   ESOPHAGOGASTRODUODENOSCOPY (EGD) WITH PROPOFOL  N/A 12/25/2021   Procedure: ESOPHAGOGASTRODUODENOSCOPY (EGD) WITH PROPOFOL ;  Surgeon: Brice Campi Albino Alu., MD;  Location: [facility] ENDOSCOPY;  Service: Gastroenterology;  Laterality: N/A;   EUS N/A 12/25/2021   Procedure: UPPER ENDOSCOPIC ULTRASOUND (EUS) RADIAL;  Surgeon: Normie Becton., MD;  Location: [facility]  ENDOSCOPY;  Service: Gastroenterology;  Laterality: N/A;   EUS N/A 04/16/2022   Procedure: UPPER ENDOSCOPIC ULTRASOUND (EUS) RADIAL;  Surgeon: Normie Becton., MD;  Location: [facility] ENDOSCOPY;  Service: Gastroenterology;  Laterality: N/A;   FIDUCIAL MARKER PLACEMENT N/A 04/16/2022   Procedure: FIDUCIAL MARKER PLACEMENT;  Surgeon: Normie Becton., MD;  Location: [facility] ENDOSCOPY;  Service: Gastroenterology;  Laterality: N/A;   FINE NEEDLE ASPIRATION  12/25/2021   Procedure: FINE NEEDLE ASPIRATION (FNA) LINEAR;  Surgeon: Normie Becton., MD;  Location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   KYPHOPLASTY N/A 08/15/2020   Procedure: L2 compression fracture;  Surgeon: Molli Angelucci, MD;  Location: [facility] ORS;  Service: Orthopedics;  Laterality: N/A;   KYPHOPLASTY N/A 08/29/2020   Procedure: T8 KYPHOPLASTY;  Surgeon: Molli Angelucci, MD;  Location: [facility] ORS;  Service: Orthopedics;  Laterality: N/A;   KYPHOPLASTY N/A 11/12/2020   Procedure: L1 KYPHOPLASTY;  Surgeon: Molli Angelucci, MD;  Location: [facility] ORS;  Service: Orthopedics;  Laterality: N/A;   PORTACATH PLACEMENT N/A 02/05/2022   Procedure: INSERTION PORT-A-CATH;  Surgeon: Lujean Sake, MD;  Location: [facility] ORS;  Service: General;  Laterality: N/A;    I have reviewed the social history and family history with the patient and they are unchanged from previous note.  ALLERGIES:  is allergic to influenza vaccines and influenza virus vaccine.  MEDICATIONS:  Current Outpatient Medications  Medication Sig Dispense Refill   acetaminophen  (TYLENOL ) 500 MG tablet Take 1 tablet (  500 mg total) by mouth every 6 (six) hours as needed for moderate pain or mild pain. (Patient taking differently: Take 500 mg by mouth as needed for moderate pain (pain score 4-6) or mild pain (pain score 1-3).) 30 tablet 0   amiodarone  (PACERONE ) 200 MG tablet Take 2 tabs twice daily x 7 days  until 12/15/23 and from 12/16/23 take 1 tab daily 30 tablet 0   ammonium lactate (LAC-HYDRIN) 12 % lotion Apply 1 Application topically as needed.     bisacodyl  (DULCOLAX) 10 MG suppository Place 1 suppository (10 mg total) rectally daily as needed for moderate constipation. 12 suppository 0   carbidopa -levodopa  (SINEMET  CR) 50-200 MG tablet TAKE 1 TABLET AT BEDTIME 90 tablet 3   carbidopa -levodopa  (SINEMET  IR) 25-100 MG tablet Take 3 tablets at 7 AM, 2 tablets at 11 AM, 3 tablets at 3 PM, 2 tablets at 7 PM 900 tablet 0   ciclopirox (PENLAC) 8 % solution Apply 1 Application topically once a week.     clonazePAM  (KLONOPIN ) 0.5 MG tablet Take 1 tablet (0.5 mg total) by mouth at bedtime. 5 tablet 0   cyanocobalamin  (VITAMIN B12) 1000 MCG tablet Take 1,000 mcg by mouth daily.     ELIQUIS  5 MG TABS tablet TAKE 1 TABLET TWICE A DAY 120 tablet 5   furosemide  (LASIX ) 20 MG tablet Take 1 tablet (20 mg total) by mouth daily as needed for fluid or edema. 30 tablet 0   Glycerin -Hypromellose-PEG 400 (VISINE DRY EYE OP) Place 1 drop into both eyes 2 (two) times daily as needed (eye irritation).      ketoconazole (NIZORAL) 2 % shampoo Apply 1 application  topically 3 (three) times a week.     leuprolide , 6 Month, (ELIGARD ) 45 MG injection Inject 45 mg into the skin every 6 (six) months.     lidocaine -prilocaine  (EMLA ) cream Apply to port site 1 to 2 hours prior to appointments/treatment 30 g 3   metoprolol  succinate (TOPROL -XL) 25 MG 24 hr tablet Take 1 tablet (25 mg total) by mouth daily. 30 tablet 0   midodrine  (PROAMATINE ) 5 MG tablet Take 1 tablet (5 mg total) by mouth 3 (three) times daily with meals. 90 tablet 0   omeprazole  (PRILOSEC) 40 MG capsule Take 40 mg by mouth daily.     ondansetron  (ZOFRAN ) 8 MG tablet Take 1 tablet (8 mg total) by mouth every 8 (eight) hours as needed for nausea or vomiting. 30 tablet 1   oxyCODONE  (OXY IR/ROXICODONE ) 5 MG immediate release tablet Take 1 tablet (5 mg total) by mouth  every 4 (four) hours as needed for severe pain (pain score 7-10). 90 tablet 0   Pancrelipase , Lip-Prot-Amyl, (PANCREAZE ) 37000-97300 units CPEP TAKE 2 CAPSULES WITH BREAKFAST, LUNCH, AND EVENING MEAL (WITH EACH MEAL) AND 1 CAPSULE WITH SNACK 600 capsule 5   polyethylene glycol powder (MIRALAX ) 17 GM/SCOOP powder Take 17 g by mouth in the morning, at noon, and at bedtime.     prochlorperazine  (COMPAZINE ) 10 MG tablet Take 1 tablet (10 mg total) by mouth every 6 (six) hours as needed for nausea or vomiting. 30 tablet 2   rosuvastatin  (CRESTOR ) 10 MG tablet TAKE 1 TABLET DAILY 90 tablet 1   tiZANidine  (ZANAFLEX ) 2 MG tablet Take 1 tablet (2 mg total) by mouth every 8 (eight) hours as needed for muscle spasms. 10 tablet 0   triamcinolone  cream (KENALOG ) 0.1 % Apply 1 application topically 2 (two) times daily. (Patient taking differently: Apply 1 application  topically 2 (two) times daily as needed (itching).) 453.6 g 1   zoledronic  acid (RECLAST ) 5 MG/100ML SOLN injection Inject 5 mg into the vein See admin instructions. Once a year     No current facility-administered medications for this visit.    PHYSICAL EXAMINATION: ECOG PERFORMANCE STATUS: 2 - Symptomatic, <50% confined to bed  Vitals:   12/30/23 1326  BP: 100/68  Pulse: 69  Resp: (!) 21  Temp: 98.1 F (36.7 C)  SpO2: 96%   Wt Readings from Last 3 Encounters:  12/30/23 133 lb 3.2 oz (60.4 kg)  12/23/23 131 lb 3.2 oz (59.5 kg)  12/16/23 132 lb 3.2 oz (60 kg)     GENERAL:alert, no distress and comfortable SKIN: skin color, texture, turgor are normal, no rashes or significant lesions EYES: normal, Conjunctiva are pink and non-injected, sclera clear NECK: supple, thyroid normal size, non-tender, without nodularity LYMPH:  no palpable lymphadenopathy in the cervical, axillary  LUNGS: clear to auscultation and percussion with normal breathing effort HEART: regular rate & rhythm and no murmurs and no lower extremity  edema ABDOMEN:abdomen soft, non-tender and normal bowel sounds Musculoskeletal:no cyanosis of digits and no clubbing  NEURO: alert & oriented x 3 with fluent speech, no focal motor/sensory deficits  Physical Exam VITALS: P- 69   LABORATORY DATA:  I have reviewed the data as listed    Latest Ref Rng & Units 12/30/2023    1:04 PM 12/28/2023    3:32 PM 12/16/2023   12:43 PM  CBC  WBC 4.0 - 10.5 K/uL 7.8  5.7  6.3   Hemoglobin 13.0 - 17.0 g/dL 9.1  8.8  9.9   Hematocrit 39.0 - 52.0 % 28.0  27.7  30.8   Platelets 150 - 400 K/uL 172  139  189         Latest Ref Rng & Units 12/30/2023    1:04 PM 12/28/2023    3:32 PM 12/16/2023   12:43 PM  CMP  Glucose 70 - 99 mg/dL 161  096  045   BUN 8 - 23 mg/dL 30  27  27    Creatinine 0.61 - 1.24 mg/dL 4.09  8.11  9.14   Sodium 135 - 145 mmol/L 139  137  138   Potassium 3.5 - 5.1 mmol/L 4.0  3.9  4.3   Chloride 98 - 111 mmol/L 109  104  107   CO2 22 - 32 mmol/L 26  22  27    Calcium  8.9 - 10.3 mg/dL 9.0  9.1  9.2   Total Protein 6.5 - 8.1 g/dL 6.3  5.9  7.0   Total Bilirubin 0.0 - 1.2 mg/dL 1.0  0.8  0.9   Alkaline Phos 38 - 126 U/L 102  100  96   AST 15 - 41 U/L 78  120  39   ALT 0 - 44 U/L 12  16  7        RADIOGRAPHIC STUDIES: I have personally reviewed the radiological images as listed and agreed with the findings in the report. No results found.    No orders of the defined types were placed in this encounter.  All questions were answered. The patient knows to call the clinic with any problems, questions or concerns. No barriers to learning was detected. The total time spent in the appointment was 30 minutes.     Sonja , MD 12/30/2023

## 2023-12-30 NOTE — Progress Notes (Signed)
 Palliative Medicine [facility]  [phone] [phone]   Name: [name] [name] Date: 12/30/2023 MRN: [id number]  DOB: 1941-05-15  Patient Care Team: Arva Lathe, MD as PCP - General (Internal Medicine) Olinda Bertrand, DO as PCP - Cardiology (Cardiology) Tat, Von Grumbling, DO as Consulting Physician (Neurology) Gwyn Leos, MD as Consulting Physician (Hematology and Oncology) Lujean Sake, MD as Consulting Physician (General Surgery) Sonja Swede Heaven, MD as Consulting Physician (Hematology and Oncology) Prudy Brownie, DO as Consulting Physician (Pulmonary Disease) Pickenpack-Cousar, Giles Labrum, NP as Nurse Practitioner (Hospice and Palliative Medicine)    INTERVAL HISTORY: [name] [name] is a 83 y.o. 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 Mr.Lehner' history also includes  pancreatic cancer (09/2021) as well as parkinson's disease. Palliative ask to see for symptom management and goals of care.   SOCIAL HISTORY:     reports that he quit smoking about 13 years ago. His smoking use included cigars. He has never used smokeless tobacco. He reports that he does not currently use alcohol  after a past usage of about 4.0 standard drinks of alcohol  per week. He reports that he does not use drugs.  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  MEDICATIONS:  Current Outpatient Medications  Medication Sig Dispense Refill   acetaminophen  (TYLENOL ) 500 MG tablet Take 1 tablet (500 mg total) by mouth every 6 (six) hours as needed for moderate pain or mild pain. (Patient taking differently: Take 500 mg by mouth as needed for moderate pain (pain score 4-6) or mild pain (pain score 1-3).) 30 tablet 0   amiodarone  (PACERONE ) 200 MG tablet Take 2 tabs twice daily x 7 days until 12/15/23 and from 12/16/23 take 1 tab daily 30 tablet 0   ammonium lactate (LAC-HYDRIN) 12 % lotion Apply 1 Application topically as needed.     bisacodyl  (DULCOLAX) 10 MG suppository Place 1 suppository (10 mg total) rectally daily as needed for moderate constipation. 12 suppository 0   carbidopa -levodopa  (SINEMET  CR) 50-200 MG tablet TAKE 1 TABLET AT BEDTIME 90 tablet 3   carbidopa -levodopa  (SINEMET  IR) 25-100 MG tablet Take 3 tablets at 7 AM, 2 tablets at 11 AM, 3 tablets at 3 PM, 2 tablets at 7 PM 900 tablet 0   ciclopirox (PENLAC) 8 % solution Apply 1 Application topically once a week.     clonazePAM  (KLONOPIN ) 0.5 MG tablet Take 1 tablet (0.5 mg total) by mouth at bedtime. 5 tablet 0   cyanocobalamin  (VITAMIN B12) 1000 MCG tablet Take 1,000 mcg by mouth daily.     ELIQUIS  5 MG TABS tablet TAKE 1 TABLET TWICE A DAY 120 tablet 5   furosemide  (LASIX ) 20 MG tablet Take 1 tablet (20 mg total) by mouth daily as needed for fluid or edema.  30 tablet 0   Glycerin -Hypromellose-PEG 400 (VISINE DRY EYE OP) Place 1 drop into both eyes 2 (two) times daily as needed (eye irritation).      ketoconazole (NIZORAL) 2 % shampoo Apply 1 application  topically 3 (three) times a week.     leuprolide , 6 Month, (ELIGARD ) 45 MG injection Inject 45 mg into the skin every 6 (six) months.     lidocaine -prilocaine  (EMLA ) cream Apply to port site 1 to 2 hours prior to appointments/treatment 30 g 3   metoprolol  succinate (TOPROL -XL) 25 MG 24 hr tablet Take 1 tablet (25 mg  total) by mouth daily. 30 tablet 0   midodrine  (PROAMATINE ) 5 MG tablet Take 1 tablet (5 mg total) by mouth 3 (three) times daily with meals. 90 tablet 0   omeprazole  (PRILOSEC) 40 MG capsule Take 40 mg by mouth daily.     ondansetron  (ZOFRAN ) 8 MG tablet Take 1 tablet (8 mg total) by mouth every 8 (eight) hours as needed for nausea or vomiting. 30 tablet 1   oxyCODONE  (OXY IR/ROXICODONE ) 5 MG immediate release tablet Take 1 tablet (5 mg total) by mouth every 4 (four) hours as needed for severe pain (pain score 7-10). 90 tablet 0   Pancrelipase , Lip-Prot-Amyl, (PANCREAZE ) 37000-97300 units CPEP TAKE 2 CAPSULES WITH BREAKFAST, LUNCH, AND EVENING MEAL (WITH EACH MEAL) AND 1 CAPSULE WITH SNACK 600 capsule 5   polyethylene glycol powder (MIRALAX ) 17 GM/SCOOP powder Take 17 g by mouth in the morning, at noon, and at bedtime.     prochlorperazine  (COMPAZINE ) 10 MG tablet Take 1 tablet (10 mg total) by mouth every 6 (six) hours as needed for nausea or vomiting. 30 tablet 2   rosuvastatin  (CRESTOR ) 10 MG tablet TAKE 1 TABLET DAILY 90 tablet 1   tiZANidine  (ZANAFLEX ) 2 MG tablet Take 1 tablet (2 mg total) by mouth every 8 (eight) hours as needed for muscle spasms. 10 tablet 0   triamcinolone  cream (KENALOG ) 0.1 % Apply 1 application topically 2 (two) times daily. (Patient taking differently: Apply 1 application  topically 2 (two) times daily as needed (itching).) 453.6 g 1   zoledronic  acid (RECLAST ) 5 MG/100ML SOLN injection Inject 5 mg into the vein See admin instructions. Once a year     No current facility-administered medications for this visit.    VITAL SIGNS: There were no vitals taken for this visit. There were no vitals filed for this visit.  Estimated body mass index is 22.86 kg/m as calculated from the following:   Height as of an earlier encounter on 12/30/23: 5\' 4"  (1.626 m).   Weight as of an earlier encounter on 12/30/23: 133 lb 3.2 oz (60.4 kg).   PERFORMANCE STATUS (ECOG) : 1 -  Symptomatic but completely ambulatory   Physical Exam General: NAD, ambulatory with Rolator  Cardiovascular: regular rate and rhythm Pulmonary: normal breathing pattern Extremities: no edema, no joint deformities Skin: no rashes Neurological: AAO x3  Discussed the use of AI scribe software for clinical note transcription with the patient, who gave verbal consent to proceed.  History of Present Illness [name] [name] "Ed" is an 83 year old male who presents with consistent lower back pain. His daughter who is in [facility] on speakerphone. Patient reports he is doing well overall. No acute distress. He experiences constipation as a side effect of oxycodone , which he manages by taking three doses of Miralax  daily. This regimen effectively maintains regular bowel movements. No issues with diarrhea. His appetite has been  better, and his weight is currently 139 pounds, a slight decrease from 141 pounds on February 27. He consumes one Premier Protein drink daily to support his nutrition. He takes clonazepam  at night for sleep.  Reports occasional nausea and dry heaves, which have become more consistent. He is unsure of the cause but notes that it is not related to any specific medication. He takes Compazine  10 mg every six hours as needed and Zofran  8 mg every eight hours as needed for nausea, which provides relief. He also takes Prilosec daily.  Mr. Ruhe experiences consistent lower back pain described as being 'all over' the lower back. Movement sometimes alleviates the pain, but excessive activity can increase discomfort, necessitating a stop in activity. Reclining back in a chair is more comfortable than sitting up. He rates the worst pain as an eight out of ten, which is reduced to a two with oxycodone . He takes oxycodone  approximately every six hours as needed, although it is prescribed for every four hours. He also takes Tylenol  to enhance pain relief when oxycodone  alone is  insufficient. Pain is well managed on current regimen. No adjustments at this time.   All questions answered and support provided.    JANCE SIEK "Ed" is an 83 year old male who presents for follow-up after an emergency room visit. He is accompanied by his son. Patient reports he feels somewhat better since recent ED visit. Denies concerns with nausea, vomiting, or diarrhea. He reports improved sleep quality, attributing some of this to the use of CBD edibles, which he finds helpful for relaxation and sleep without intoxicating effects.  He experiences some irregularity in bowel movements, though not severe constipation. He uses Colace once daily and Miralax  two to three times daily to manage this. He acknowledges that he is not as regular as he would like but is not 'stopped up for days on end.' Discussed increasing colace to twice daily for better pattern.   His appetite is improving, though it requires constant effort. He is adjusting his diet to include more salt and liquid and is increasing his intake of protein drinks from one to one and a half daily, aiming for two to three. His current weight is stable at 133lbs.   He feels better than he did in the past 24 to 48 hours following a recent emergency room visit. He currently does not experience any pain, although he takes pain medication on average twice a day, occasionally three times if he anticipates increased activity. His pain management regimen includes oxycodone  and Tylenol , which he finds effective. No adjustments at this time.   Goals of care  Mr. Persing infusion was held today due to recent ED visit, dehydration, and overall decline in performance status. Patient is realistic in his understanding of cancer state and prognosis. He is clear to continue to treat the treatable allowing him every opportunity to thrive however with his quality of life centered.    I discussed the importance of continued conversation with family and  their medical providers regarding overall plan of care and treatment options, ensuring decisions are within the context of the patients values and GOCs.  Assessment & Plan Cancer Related  pain Pain controlled with oxycodone  and acetaminophen , taken 2-3 times daily. Constipation managed with polyethylene glycol. - Continue oxycodone  every six hours as needed. - Add acetaminophen  as needed, one tablet every six hours or two tablets every eight hours. - Manage constipation with polyethylene glycol, three doses daily and colace. May  increase daily colace to twice daily.   Appetite issues Appetite improving with dietary changes. Consuming 1.5 protein drinks daily, goal 2-3. - Encourage protein drink intake to 2-3 per day. - Continue dietary modifications with increased salt and liquid intake. - Weight is stable.   Sleep disturbance Sleep improved with CBD edibles, aiding relaxation and appetite. No intoxicating effects. - Continue CBD edibles for sleep and relaxation.  I will follow-up with patient in 2-3 weeks.  Sooner if needed.   Patient expressed understanding and was in agreement with this plan. He also understands that He can call the clinic at any time with any questions, concerns, or complaints.   Any controlled substances utilized were prescribed in the context of palliative care. PDMP has been reviewed.   Visit consisted of counseling and education dealing with the complex and emotionally intense issues of symptom management and palliative care in the setting of serious and potentially life-threatening illness.  Dellia Ferguson, AGPCNP-BC  Palliative Medicine Team/ Cancer Center

## 2023-12-30 NOTE — Assessment & Plan Note (Signed)
 diagnosed in 09/2021, deemed poor surgical candidate due to his age and medical co-morbidities -S/p single agent gemcitabine 01/13/22 - 04/01/22 and 5 fx SBRT [name] [name]) 05/04/22 - 05/14/22. CA 19-9 decreased after treatment -Surveillance CT 11/09/2022 showed slightly decreased size of pancreatic head mass, no evidence of metastatic disease.  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  He is taking ibuprofen as needed.  No other clinical concern for cancer progression. -Lab reviewed, overall stable, tumor marker still pending. -He was admitted for right femoral neck fracture  on 02/03/23 after a fall and had surgery  -CT scan from May 17, 2023 showed multiple lung nodules, with dominant 1.5 cm nodule in the right middle lobe, highly concerning for metastasis.  His lung lesions are positive on PET scan. -Bronchoscopy and biopsy attempted, which showed atypical cells. -He restarted chemo gemcitabine and abraxane on 07/29/2023. -restaging CT 10/01/2023 showed stable disease  -Due to increased fatigue, chemo changed to single agent gemcitabine on October 21, 2023

## 2023-12-31 ENCOUNTER — Encounter (HOSPITAL_COMMUNITY): Payer: Self-pay

## 2023-12-31 ENCOUNTER — Emergency Department (HOSPITAL_COMMUNITY)

## 2023-12-31 ENCOUNTER — Other Ambulatory Visit: Payer: Self-pay

## 2023-12-31 ENCOUNTER — Inpatient Hospital Stay (HOSPITAL_COMMUNITY)
Admission: EM | Admit: 2023-12-31 | Discharge: 2024-01-05 | DRG: 292 | Disposition: A | Discharge status: Hospice | Attending: Internal Medicine | Admitting: Internal Medicine

## 2023-12-31 DIAGNOSIS — Z7401 Bed confinement status: Secondary | ICD-10-CM | POA: Diagnosis not present

## 2023-12-31 DIAGNOSIS — I499 Cardiac arrhythmia, unspecified: Secondary | ICD-10-CM | POA: Diagnosis not present

## 2023-12-31 DIAGNOSIS — N401 Enlarged prostate with lower urinary tract symptoms: Secondary | ICD-10-CM | POA: Diagnosis not present

## 2023-12-31 DIAGNOSIS — R1310 Dysphagia, unspecified: Secondary | ICD-10-CM | POA: Diagnosis present

## 2023-12-31 DIAGNOSIS — G20A1 Parkinson's disease without dyskinesia, without mention of fluctuations: Secondary | ICD-10-CM | POA: Diagnosis present

## 2023-12-31 DIAGNOSIS — C61 Malignant neoplasm of prostate: Secondary | ICD-10-CM | POA: Diagnosis present

## 2023-12-31 DIAGNOSIS — Z66 Do not resuscitate: Secondary | ICD-10-CM | POA: Diagnosis present

## 2023-12-31 DIAGNOSIS — D630 Anemia in neoplastic disease: Secondary | ICD-10-CM | POA: Diagnosis present

## 2023-12-31 DIAGNOSIS — F0280 Dementia in other diseases classified elsewhere without behavioral disturbance: Secondary | ICD-10-CM | POA: Diagnosis present

## 2023-12-31 DIAGNOSIS — D72829 Elevated white blood cell count, unspecified: Secondary | ICD-10-CM | POA: Diagnosis present

## 2023-12-31 DIAGNOSIS — G4733 Obstructive sleep apnea (adult) (pediatric): Secondary | ICD-10-CM | POA: Diagnosis present

## 2023-12-31 DIAGNOSIS — E46 Unspecified protein-calorie malnutrition: Secondary | ICD-10-CM | POA: Diagnosis present

## 2023-12-31 DIAGNOSIS — N400 Enlarged prostate without lower urinary tract symptoms: Secondary | ICD-10-CM | POA: Diagnosis present

## 2023-12-31 DIAGNOSIS — R000 Tachycardia, unspecified: Secondary | ICD-10-CM | POA: Diagnosis not present

## 2023-12-31 DIAGNOSIS — Z923 Personal history of irradiation: Secondary | ICD-10-CM | POA: Diagnosis not present

## 2023-12-31 DIAGNOSIS — M8000XA Age-related osteoporosis with current pathological fracture, unspecified site, initial encounter for fracture: Secondary | ICD-10-CM | POA: Diagnosis not present

## 2023-12-31 DIAGNOSIS — I509 Heart failure, unspecified: Secondary | ICD-10-CM

## 2023-12-31 DIAGNOSIS — C7800 Secondary malignant neoplasm of unspecified lung: Secondary | ICD-10-CM | POA: Diagnosis present

## 2023-12-31 DIAGNOSIS — N179 Acute kidney failure, unspecified: Secondary | ICD-10-CM | POA: Diagnosis present

## 2023-12-31 DIAGNOSIS — I4892 Unspecified atrial flutter: Secondary | ICD-10-CM | POA: Diagnosis present

## 2023-12-31 DIAGNOSIS — C259 Malignant neoplasm of pancreas, unspecified: Secondary | ICD-10-CM | POA: Diagnosis not present

## 2023-12-31 DIAGNOSIS — C250 Malignant neoplasm of head of pancreas: Secondary | ICD-10-CM | POA: Diagnosis not present

## 2023-12-31 DIAGNOSIS — Z515 Encounter for palliative care: Secondary | ICD-10-CM | POA: Diagnosis not present

## 2023-12-31 DIAGNOSIS — I5023 Acute on chronic systolic (congestive) heart failure: Principal | ICD-10-CM | POA: Diagnosis present

## 2023-12-31 DIAGNOSIS — R531 Weakness: Secondary | ICD-10-CM | POA: Diagnosis not present

## 2023-12-31 DIAGNOSIS — Z9221 Personal history of antineoplastic chemotherapy: Secondary | ICD-10-CM

## 2023-12-31 DIAGNOSIS — I480 Paroxysmal atrial fibrillation: Secondary | ICD-10-CM | POA: Diagnosis present

## 2023-12-31 DIAGNOSIS — R64 Cachexia: Secondary | ICD-10-CM | POA: Diagnosis present

## 2023-12-31 DIAGNOSIS — G4730 Sleep apnea, unspecified: Secondary | ICD-10-CM | POA: Diagnosis present

## 2023-12-31 DIAGNOSIS — J811 Chronic pulmonary edema: Secondary | ICD-10-CM | POA: Diagnosis not present

## 2023-12-31 DIAGNOSIS — I4891 Unspecified atrial fibrillation: Principal | ICD-10-CM | POA: Diagnosis present

## 2023-12-31 DIAGNOSIS — Z87891 Personal history of nicotine dependence: Secondary | ICD-10-CM | POA: Diagnosis not present

## 2023-12-31 DIAGNOSIS — R0602 Shortness of breath: Secondary | ICD-10-CM | POA: Diagnosis not present

## 2023-12-31 DIAGNOSIS — R4702 Dysphasia: Secondary | ICD-10-CM | POA: Diagnosis not present

## 2023-12-31 DIAGNOSIS — R918 Other nonspecific abnormal finding of lung field: Secondary | ICD-10-CM | POA: Diagnosis not present

## 2023-12-31 DIAGNOSIS — Z8546 Personal history of malignant neoplasm of prostate: Secondary | ICD-10-CM

## 2023-12-31 DIAGNOSIS — Z803 Family history of malignant neoplasm of breast: Secondary | ICD-10-CM

## 2023-12-31 DIAGNOSIS — Z96641 Presence of right artificial hip joint: Secondary | ICD-10-CM | POA: Diagnosis present

## 2023-12-31 DIAGNOSIS — Z7189 Other specified counseling: Secondary | ICD-10-CM | POA: Diagnosis not present

## 2023-12-31 DIAGNOSIS — E860 Dehydration: Secondary | ICD-10-CM | POA: Diagnosis present

## 2023-12-31 DIAGNOSIS — Z887 Allergy status to serum and vaccine status: Secondary | ICD-10-CM

## 2023-12-31 DIAGNOSIS — Z681 Body mass index (BMI) 19 or less, adult: Secondary | ICD-10-CM

## 2023-12-31 DIAGNOSIS — Z79899 Other long term (current) drug therapy: Secondary | ICD-10-CM

## 2023-12-31 DIAGNOSIS — Z7901 Long term (current) use of anticoagulants: Secondary | ICD-10-CM

## 2023-12-31 DIAGNOSIS — I959 Hypotension, unspecified: Secondary | ICD-10-CM | POA: Diagnosis present

## 2023-12-31 DIAGNOSIS — J90 Pleural effusion, not elsewhere classified: Secondary | ICD-10-CM | POA: Diagnosis not present

## 2023-12-31 DIAGNOSIS — I110 Hypertensive heart disease with heart failure: Secondary | ICD-10-CM | POA: Diagnosis not present

## 2023-12-31 DIAGNOSIS — C7801 Secondary malignant neoplasm of right lung: Secondary | ICD-10-CM | POA: Diagnosis not present

## 2023-12-31 DIAGNOSIS — C7951 Secondary malignant neoplasm of bone: Secondary | ICD-10-CM | POA: Diagnosis present

## 2023-12-31 DIAGNOSIS — R627 Adult failure to thrive: Secondary | ICD-10-CM | POA: Diagnosis present

## 2023-12-31 DIAGNOSIS — I10 Essential (primary) hypertension: Secondary | ICD-10-CM | POA: Diagnosis not present

## 2023-12-31 DIAGNOSIS — R0989 Other specified symptoms and signs involving the circulatory and respiratory systems: Secondary | ICD-10-CM | POA: Diagnosis not present

## 2023-12-31 DIAGNOSIS — D649 Anemia, unspecified: Secondary | ICD-10-CM | POA: Diagnosis not present

## 2023-12-31 DIAGNOSIS — Z8249 Family history of ischemic heart disease and other diseases of the circulatory system: Secondary | ICD-10-CM

## 2023-12-31 DIAGNOSIS — Z833 Family history of diabetes mellitus: Secondary | ICD-10-CM

## 2023-12-31 DIAGNOSIS — R069 Unspecified abnormalities of breathing: Secondary | ICD-10-CM | POA: Diagnosis not present

## 2023-12-31 DIAGNOSIS — G20C Parkinsonism, unspecified: Secondary | ICD-10-CM | POA: Diagnosis not present

## 2023-12-31 DIAGNOSIS — Z8507 Personal history of malignant neoplasm of pancreas: Secondary | ICD-10-CM

## 2023-12-31 LAB — CBC WITH DIFFERENTIAL/PLATELET
Abs Immature Granulocytes: 0 10*3/uL (ref 0.00–0.07)
Basophils Absolute: 0 10*3/uL (ref 0.0–0.1)
Basophils Relative: 0 %
Eosinophils Absolute: 0 10*3/uL (ref 0.0–0.5)
Eosinophils Relative: 0 %
HCT: 34.1 % — ABNORMAL LOW (ref 39.0–52.0)
Hemoglobin: 10.8 g/dL — ABNORMAL LOW (ref 13.0–17.0)
Lymphocytes Relative: 4 %
Lymphs Abs: 0.5 10*3/uL — ABNORMAL LOW (ref 0.7–4.0)
MCH: 31.3 pg (ref 26.0–34.0)
MCHC: 31.7 g/dL (ref 30.0–36.0)
MCV: 98.8 fL (ref 80.0–100.0)
Monocytes Absolute: 1.2 10*3/uL — ABNORMAL HIGH (ref 0.1–1.0)
Monocytes Relative: 9 %
Neutro Abs: 11.7 10*3/uL — ABNORMAL HIGH (ref 1.7–7.7)
Neutrophils Relative %: 87 %
Platelets: 256 10*3/uL (ref 150–400)
RBC: 3.45 MIL/uL — ABNORMAL LOW (ref 4.22–5.81)
RDW: 21.1 % — ABNORMAL HIGH (ref 11.5–15.5)
WBC: 13.4 10*3/uL — ABNORMAL HIGH (ref 4.0–10.5)
nRBC: 0 % (ref 0.0–0.2)
nRBC: 0 /100{WBCs}

## 2023-12-31 LAB — I-STAT CHEM 8, ED
BUN: 31 mg/dL — ABNORMAL HIGH (ref 8–23)
Calcium, Ion: 1.12 mmol/L — ABNORMAL LOW (ref 1.15–1.40)
Chloride: 108 mmol/L (ref 98–111)
Creatinine, Ser: 1 mg/dL (ref 0.61–1.24)
Glucose, Bld: 113 mg/dL — ABNORMAL HIGH (ref 70–99)
HCT: 36 % — ABNORMAL LOW (ref 39.0–52.0)
Hemoglobin: 12.2 g/dL — ABNORMAL LOW (ref 13.0–17.0)
Potassium: 4.7 mmol/L (ref 3.5–5.1)
Sodium: 138 mmol/L (ref 135–145)
TCO2: 22 mmol/L (ref 22–32)

## 2023-12-31 LAB — COMPREHENSIVE METABOLIC PANEL WITH GFR
ALT: 12 U/L (ref 0–44)
AST: 105 U/L — ABNORMAL HIGH (ref 15–41)
Albumin: 3.5 g/dL (ref 3.5–5.0)
Alkaline Phosphatase: 131 U/L — ABNORMAL HIGH (ref 38–126)
Anion gap: 11 (ref 5–15)
BUN: 24 mg/dL — ABNORMAL HIGH (ref 8–23)
CO2: 19 mmol/L — ABNORMAL LOW (ref 22–32)
Calcium: 9.2 mg/dL (ref 8.9–10.3)
Chloride: 107 mmol/L (ref 98–111)
Creatinine, Ser: 1.08 mg/dL (ref 0.61–1.24)
GFR, Estimated: >60 mL/min (ref >60)
Glucose, Bld: 118 mg/dL — ABNORMAL HIGH (ref 70–99)
Potassium: 4.6 mmol/L (ref 3.5–5.1)
Sodium: 137 mmol/L (ref 135–145)
Total Bilirubin: 1.8 mg/dL — ABNORMAL HIGH (ref 0.0–1.2)
Total Protein: 7.3 g/dL (ref 6.5–8.1)

## 2023-12-31 LAB — TROPONIN I (HIGH SENSITIVITY)
Troponin I (High Sensitivity): 17 ng/L (ref <18)
Troponin I (High Sensitivity): 23 ng/L — ABNORMAL HIGH (ref <18)

## 2023-12-31 LAB — CANCER ANTIGEN 19-9: CA 19-9: 586 U/mL — ABNORMAL HIGH (ref 0–35)

## 2023-12-31 LAB — CBG MONITORING, ED: Glucose-Capillary: 116 mg/dL — ABNORMAL HIGH (ref 70–99)

## 2023-12-31 LAB — BRAIN NATRIURETIC PEPTIDE: B Natriuretic Peptide: 1757 pg/mL — ABNORMAL HIGH (ref 0.0–100.0)

## 2023-12-31 MED ORDER — CARBIDOPA-LEVODOPA 25-100 MG PO TABS
3.0000 | ORAL_TABLET | Freq: Two times a day (BID) | ORAL | Status: DC
  Administered 2024-01-01 – 2024-01-05 (×9): 3 via ORAL
  Filled 2023-12-31 (×10): qty 3

## 2023-12-31 MED ORDER — AMIODARONE HCL 200 MG PO TABS
200.0000 mg | ORAL_TABLET | Freq: Every day | ORAL | Status: DC
  Administered 2024-01-01 – 2024-01-05 (×5): 200 mg via ORAL
  Filled 2023-12-31 (×5): qty 1

## 2023-12-31 MED ORDER — FUROSEMIDE 10 MG/ML IJ SOLN
80.0000 mg | Freq: Once | INTRAMUSCULAR | Status: AC
  Administered 2023-12-31: 80 mg via INTRAVENOUS
  Filled 2023-12-31: qty 8

## 2023-12-31 MED ORDER — APIXABAN 5 MG PO TABS
5.0000 mg | ORAL_TABLET | Freq: Two times a day (BID) | ORAL | Status: DC
  Administered 2023-12-31 – 2024-01-04 (×8): 5 mg via ORAL
  Filled 2023-12-31 (×8): qty 1

## 2023-12-31 MED ORDER — FUROSEMIDE 10 MG/ML IJ SOLN
40.0000 mg | Freq: Two times a day (BID) | INTRAMUSCULAR | Status: DC
  Administered 2024-01-01 – 2024-01-02 (×4): 40 mg via INTRAVENOUS
  Filled 2023-12-31 (×4): qty 4

## 2023-12-31 MED ORDER — ACETAMINOPHEN 650 MG RE SUPP: 650.0000 mg | Freq: Four times a day (QID) | RECTAL | Status: DC | PRN

## 2023-12-31 MED ORDER — PANTOPRAZOLE SODIUM 40 MG PO TBEC
40.0000 mg | DELAYED_RELEASE_TABLET | Freq: Every day | ORAL | Status: DC
  Administered 2024-01-01 – 2024-01-05 (×5): 40 mg via ORAL
  Filled 2023-12-31 (×5): qty 1

## 2023-12-31 MED ORDER — CARBIDOPA-LEVODOPA ER 50-200 MG PO TBCR
1.0000 | EXTENDED_RELEASE_TABLET | Freq: Every day | ORAL | Status: DC
Start: 2023-12-31 — End: 2024-01-05
  Administered 2023-12-31 – 2024-01-04 (×5): 1 via ORAL
  Filled 2023-12-31 (×7): qty 1

## 2023-12-31 MED ORDER — OXYCODONE HCL 5 MG PO TABS
5.0000 mg | ORAL_TABLET | Freq: Four times a day (QID) | ORAL | Status: DC | PRN
  Administered 2024-01-01 – 2024-01-04 (×4): 5 mg via ORAL
  Filled 2023-12-31 (×5): qty 1

## 2023-12-31 MED ORDER — ACETAMINOPHEN 325 MG PO TABS: 650.0000 mg | ORAL_TABLET | Freq: Four times a day (QID) | ORAL | Status: DC | PRN

## 2023-12-31 MED ORDER — MIDODRINE HCL 5 MG PO TABS
5.0000 mg | ORAL_TABLET | Freq: Three times a day (TID) | ORAL | Status: DC
  Administered 2024-01-01: 5 mg via ORAL
  Filled 2023-12-31: qty 1

## 2023-12-31 MED ORDER — CARBIDOPA-LEVODOPA 25-100 MG PO TABS
2.0000 | ORAL_TABLET | Freq: Two times a day (BID) | ORAL | Status: DC
  Administered 2023-12-31 – 2024-01-05 (×10): 2 via ORAL
  Filled 2023-12-31 (×11): qty 2

## 2023-12-31 MED ORDER — DILTIAZEM HCL-DEXTROSE 125-5 MG/125ML-% IV SOLN (PREMIX)
5.0000 mg/h | INTRAVENOUS | Status: DC
  Administered 2023-12-31: 5 mg/h via INTRAVENOUS
  Filled 2023-12-31: qty 125

## 2023-12-31 MED ORDER — PROCHLORPERAZINE EDISYLATE 10 MG/2ML IJ SOLN: 5.0000 mg | Freq: Four times a day (QID) | INTRAMUSCULAR | Status: DC | PRN

## 2023-12-31 MED ORDER — METOPROLOL SUCCINATE ER 25 MG PO TB24
25.0000 mg | ORAL_TABLET | Freq: Every day | ORAL | Status: DC
  Administered 2024-01-01 – 2024-01-05 (×5): 25 mg via ORAL
  Filled 2023-12-31 (×5): qty 1

## 2023-12-31 MED ORDER — CLONAZEPAM 0.5 MG PO TABS
0.5000 mg | ORAL_TABLET | Freq: Every day | ORAL | Status: DC
  Administered 2023-12-31 – 2024-01-04 (×5): 0.5 mg via ORAL
  Filled 2023-12-31 (×5): qty 1

## 2023-12-31 MED ORDER — SENNOSIDES-DOCUSATE SODIUM 8.6-50 MG PO TABS
1.0000 | ORAL_TABLET | Freq: Every evening | ORAL | Status: DC | PRN
  Administered 2024-01-01: 1 via ORAL
  Filled 2023-12-31 (×2): qty 1

## 2023-12-31 MED ORDER — ROSUVASTATIN CALCIUM 5 MG PO TABS
10.0000 mg | ORAL_TABLET | Freq: Every day | ORAL | Status: DC
  Administered 2024-01-01 – 2024-01-05 (×5): 10 mg via ORAL
  Filled 2023-12-31 (×5): qty 2

## 2023-12-31 MED ORDER — SODIUM CHLORIDE 0.9% FLUSH
3.0000 mL | Freq: Two times a day (BID) | INTRAVENOUS | Status: DC
  Administered 2023-12-31 – 2024-01-05 (×9): 3 mL via INTRAVENOUS

## 2023-12-31 MED ORDER — METOPROLOL TARTRATE 5 MG/5ML IV SOLN
5.0000 mg | INTRAVENOUS | Status: DC | PRN
  Administered 2024-01-01: 5 mg via INTRAVENOUS
  Filled 2023-12-31: qty 5

## 2023-12-31 MED ORDER — CARBIDOPA-LEVODOPA 25-100 MG PO TABS: 1.0000 | ORAL_TABLET | ORAL | Status: DC

## 2023-12-31 MED ORDER — MORPHINE SULFATE (PF) 4 MG/ML IV SOLN
4.0000 mg | Freq: Once | INTRAVENOUS | Status: AC
  Administered 2023-12-31: 4 mg via INTRAVENOUS
  Filled 2023-12-31: qty 1

## 2023-12-31 NOTE — ED Notes (Signed)
 This nurse called CCMD to have patient monitored.

## 2023-12-31 NOTE — ED Provider Notes (Signed)
 [place] EMERGENCY DEPARTMENT AT [facility] Provider Note   CSN: [id number] Arrival date & time: 12/31/23  1929     History  Chief Complaint  Patient presents with   Shortness of Breath   Atrial Fibrillation    LISTON THUM is a 83 y.o. male history of A-fib on Eliquis , heart failure, dementia, pancreatic cancer, here presenting with shortness of breath.  Patient was seen in the ER 2 days ago for shortness of breath.  He was found to have a BNP of greater than 10,000.  He also has acute renal failure.  He was offered admission but declined.  He states that since then he has worsening shortness of breath.  He states that it is hard for him to take a deep breath.  He also noticed his legs are swollen.  He states that now he is agreeable to get admitted.  The history is provided by the patient.       Home Medications Prior to Admission medications   Medication Sig Start Date End Date Taking? Authorizing Provider  acetaminophen  (TYLENOL ) 500 MG tablet Take 1 tablet (500 mg total) by mouth every 6 (six) hours as needed for moderate pain or mild pain. Patient taking differently: Take 500 mg by mouth as needed for moderate pain (pain score 4-6) or mild pain (pain score 1-3). 04/18/22   Mansouraty, Albino Alu., MD  amiodarone  (PACERONE ) 200 MG tablet Take 2 tabs twice daily x 7 days until 12/15/23 and from 12/16/23 take 1 tab daily 12/09/23   Lesa Rape, MD  ammonium lactate (LAC-HYDRIN) 12 % lotion Apply 1 Application topically as needed. 06/22/23   [provider]  bisacodyl  (DULCOLAX) 10 MG suppository Place 1 suppository (10 mg total) rectally daily as needed for moderate constipation. 02/09/23   Sheikh, Omair Latif, DO  carbidopa -levodopa  (SINEMET  CR) 50-200 MG tablet TAKE 1 TABLET AT BEDTIME 10/18/23   Tat, Rebecca S, DO  carbidopa -levodopa  (SINEMET  IR) 25-100 MG tablet Take 3 tablets at 7 AM, 2 tablets at 11 AM, 3 tablets at 3 PM, 2 tablets at 7 PM 10/26/23   Tat,  Rebecca S, DO  ciclopirox (PENLAC) 8 % solution Apply 1 Application topically once a week.  09/29/24  [provider]  clonazePAM  (KLONOPIN ) 0.5 MG tablet Take 1 tablet (0.5 mg total) by mouth at bedtime. 02/09/23   Aura Leeds Latif, DO  cyanocobalamin  (VITAMIN B12) 1000 MCG tablet Take 1,000 mcg by mouth daily.    [provider]  ELIQUIS  5 MG TABS tablet TAKE 1 TABLET TWICE A DAY 03/12/23   Tolia, Sunit, DO  furosemide  (LASIX ) 20 MG tablet Take 1 tablet (20 mg total) by mouth daily as needed for fluid or edema. 12/09/23 01/08/24  Lesa Rape, MD  Glycerin -Hypromellose-PEG 400 (VISINE DRY EYE OP) Place 1 drop into both eyes 2 (two) times daily as needed (eye irritation).     [provider]  ketoconazole (NIZORAL) 2 % shampoo Apply 1 application  topically 3 (three) times a week. 02/15/19   [provider]  leuprolide , 6 Month, (ELIGARD ) 45 MG injection Inject 45 mg into the skin every 6 (six) months.    [provider]  lidocaine -prilocaine  (EMLA ) cream Apply to port site 1 to 2 hours prior to appointments/treatment 08/13/23   Sharyon Deis, NP  metoprolol  succinate (TOPROL -XL) 25 MG 24 hr tablet Take 1 tablet (25 mg total) by mouth daily. 12/10/23 01/09/24  Lesa Rape, MD  midodrine  (PROAMATINE ) 5 MG  tablet Take 1 tablet (5 mg total) by mouth 3 (three) times daily with meals. 12/09/23 01/08/24  Lesa Rape, MD  omeprazole  (PRILOSEC) 40 MG capsule Take 40 mg by mouth daily. 12/01/23   [provider]  ondansetron  (ZOFRAN ) 8 MG tablet Take 1 tablet (8 mg total) by mouth every 8 (eight) hours as needed for nausea or vomiting. 11/04/23   Sonja [place], MD  oxyCODONE  (OXY IR/ROXICODONE ) 5 MG immediate release tablet Take 1 tablet (5 mg total) by mouth every 4 (four) hours as needed for severe pain (pain score 7-10). 12/16/23   Pickenpack-Cousar, Giles Labrum, NP  Pancrelipase , Lip-Prot-Amyl, (PANCREAZE ) [phone] units CPEP TAKE 2 CAPSULES WITH BREAKFAST, LUNCH, AND  EVENING MEAL (WITH EACH MEAL) AND 1 CAPSULE WITH SNACK 11/13/23   Kennedy-Smith, Colleen M, NP  polyethylene glycol powder (MIRALAX ) 17 GM/SCOOP powder Take 17 g by mouth in the morning, at noon, and at bedtime.    [provider]  prochlorperazine  (COMPAZINE ) 10 MG tablet Take 1 tablet (10 mg total) by mouth every 6 (six) hours as needed for nausea or vomiting. 09/23/23   Burton, Lacie K, NP  rosuvastatin  (CRESTOR ) 10 MG tablet TAKE 1 TABLET DAILY 07/08/23   Tolia, Sunit, DO  tiZANidine  (ZANAFLEX ) 2 MG tablet Take 1 tablet (2 mg total) by mouth every 8 (eight) hours as needed for muscle spasms. 02/09/23   Sheikh, Omair Latif, DO  triamcinolone  cream (KENALOG ) 0.1 % Apply 1 application topically 2 (two) times daily. Patient taking differently: Apply 1 application  topically 2 (two) times daily as needed (itching). 01/16/21   Clarise Crooks, MD  zoledronic  acid (RECLAST ) 5 MG/100ML SOLN injection Inject 5 mg into the vein See admin instructions. Once a year    [provider]      Allergies    Influenza vaccines and Influenza virus vaccine    Review of Systems   Review of Systems  Respiratory:  Positive for shortness of breath.   All other systems reviewed and are negative.   Physical Exam Updated Vital Signs BP (!) 130/98   Pulse (!) 124   Resp (!) 25   Ht 5\' 4"  (1.626 m)   Wt 60.3 kg   SpO2 97%   BMI 22.83 kg/m  Physical Exam Vitals and nursing note reviewed.  Constitutional:      Comments: Chronically ill and tachypneic  HENT:     Head: Normocephalic.     Mouth/Throat:     Mouth: Mucous membranes are moist.  Eyes:     Extraocular Movements: Extraocular movements intact.     Pupils: Pupils are equal, round, and reactive to light.  Cardiovascular:     Rate and Rhythm: Tachycardia present.  Pulmonary:     Comments: Tachypneic and crackles bilateral bases Abdominal:     Palpations: Abdomen is soft.  Musculoskeletal:     Cervical back: Normal range of motion  and neck supple.     Comments: 2+ edema bilaterally   Skin:    General: Skin is warm.     Capillary Refill: Capillary refill takes less than 2 seconds.  Neurological:     General: No focal deficit present.  Psychiatric:        Mood and Affect: Mood normal.     ED Results / Procedures / Treatments   Labs (all labs ordered are listed, but only abnormal results are displayed) Labs Reviewed  CBC WITH DIFFERENTIAL/PLATELET - Abnormal; Notable for the following components:      Result  Value   WBC 13.4 (*)    RBC 3.45 (*)    Hemoglobin 10.8 (*)    HCT 34.1 (*)    RDW 21.1 (*)    Neutro Abs 11.7 (*)    Lymphs Abs 0.5 (*)    Monocytes Absolute 1.2 (*)    All other components within normal limits  CBG MONITORING, ED - Abnormal; Notable for the following components:   Glucose-Capillary 116 (*)    All other components within normal limits  I-STAT CHEM 8, ED - Abnormal; Notable for the following components:   BUN 31 (*)    Glucose, Bld 113 (*)    Calcium , Ion 1.12 (*)    Hemoglobin 12.2 (*)    HCT 36.0 (*)    All other components within normal limits  COMPREHENSIVE METABOLIC PANEL WITH GFR  BRAIN NATRIURETIC PEPTIDE  TROPONIN I (HIGH SENSITIVITY)  TROPONIN I (HIGH SENSITIVITY)    EKG None  Radiology DG Chest Port 1 View Result Date: 12/31/2023 CLINICAL DATA:  Chest pain. Shortness of breath. Tachycardia. Pancreatic cancer. EXAM: PORTABLE CHEST 1 VIEW COMPARISON:  Two-view chest x-ray 12/28/2023. FINDINGS: A right IJ Port-A-Cath is stable. The heart is enlarged. New bilateral pleural effusions are present, right greater than left. Mild edema is now present, also worse on the right. Bibasilar airspace opacities are worse on the right. IMPRESSION: 1. New bilateral pleural effusions, right greater than left. 2. Mild edema, also worse on the right. 3. Bibasilar airspace disease is worse on the right. This likely reflects atelectasis. Infection or aspiration is not excluded.  Electronically Signed   By: Audree Leas M.D.   On: 12/31/2023 20:04    Procedures Procedures    CRITICAL CARE Performed by: Florette Hurry   Total critical care time: 38 minutes  Critical care time was exclusive of separately billable procedures and treating other patients.  Critical care was necessary to treat or prevent imminent or life-threatening deterioration.  Critical care was time spent personally by me on the following activities: development of treatment plan with patient and/or surrogate as well as nursing, discussions with consultants, evaluation of patient's response to treatment, examination of patient, obtaining history from patient or surrogate, ordering and performing treatments and interventions, ordering and review of laboratory studies, ordering and review of radiographic studies, pulse oximetry and re-evaluation of patient's condition.    Medications Ordered in ED Medications  diltiazem  (CARDIZEM ) 125 mg in dextrose  5% 125 mL (1 mg/mL) infusion (has no administration in time range)  furosemide  (LASIX ) injection 80 mg (80 mg Intravenous Given 12/31/23 2029)  morphine  (PF) 4 MG/ML injection 4 mg (4 mg Intravenous Given 12/31/23 2156)    ED Course/ Medical Decision Making/ A&P                                 Medical Decision Making NAVIN DOGAN is a 83 y.o. male here presenting with shortness of breath.  Patient was just seen here 2 days ago and had a BNP of 10,000.  Patient was also in A-fib at that time.  However patient refused admission.  Patient now is short of breath and back in A-fib.  Plan to diurese patient and start IV Cardizem  and patient is now agreeable to admission  10:40 PM I reviewed patient's labs and white blood cell count is 13.  Creatinine is unchanged at 1.  Patient's chest x-ray showed worsening edema.  Given IV  Lasix .  Patient is on IV Cardizem  for rapid A-fib.  Hospitalist to admit.   Problems Addressed: Acute on chronic  congestive heart failure, unspecified heart failure type [facility]): acute illness or injury Atrial fibrillation with rapid ventricular response (HCC): acute illness or injury  Amount and/or Complexity of Data Reviewed Labs: ordered. Decision-making details documented in ED Course. Radiology: ordered and independent interpretation performed. Decision-making details documented in ED Course. ECG/medicine tests: ordered and independent interpretation performed. Decision-making details documented in ED Course.  Risk Prescription drug management.    Final Clinical Impression(s) / ED Diagnoses Final diagnoses:  Atrial fibrillation with rapid ventricular response (HCC)  Acute on chronic congestive heart failure, unspecified heart failure type [facility])    Rx / DC Orders ED Discharge Orders     None         Dalene Duck, MD 12/31/23 2242

## 2023-12-31 NOTE — ED Notes (Addendum)
 This nurse called lab regarding the labs that were sent at 2009. Lab stated that they never received any labs for the patient. This nurse notified EDP Dalene Duck, MD.

## 2023-12-31 NOTE — ED Triage Notes (Signed)
 Patient was at home then started experiencing shortness of breath. No complaints of a cough. Patient's HR was 140s to 160s on EMS arrival. Patient given given 500 ml bolus and 15mg  of cardizem . Patient says it is hard to take a deep breath. EMS VS 108/70 BP 80-100 HR

## 2023-12-31 NOTE — H&P (Addendum)
 History and Physical    [name] [name]:[id number] DOB: 1941/01/15 DOA: 12/31/2023  PCP: Arva Lathe, MD   Patient coming from: Home   Chief Complaint: SOB, swelling, fatigue  HPI: [name] [name] is a 83 y.o. male with medical history significant for Parkinson disease, atrial fibrillation on Eliquis , chronic HFrEF, prostate cancer metastatic to bone, pancreatic cancer on active treatment, OSA managed with BiPAP, and admission earlier this month for acute on chronic HFrEF who now presents with worsening shortness of breath, leg swelling, and fatigue.  Patient was seen in the emergency department 3 days ago for dyspnea.  He returned home after that visit but has continued to experience worsening dyspnea and fatigue.  He denies chest pain, fever, or chills.  He has a mild cough that he attributes to a nasal sinus condition.  Heart rate was 1 40-1 60s with EMS and he was given 500 mL of IV fluids and 15 mg diltiazem  prior to arrival in the ED.  ED Course: Upon arrival to the ED, patient is found to be saturating well on room air with tachypnea, tachycardia, and stable BP.  Labs are most notable for BUN 37, creatinine 1.00, WBC 13,400, and BNP 1757.  Chest x-ray demonstrates new bilateral pleural effusions and pulmonary edema.  Patient was treated in the ED with IV Lasix  and morphine .  Review of Systems:  All other systems reviewed and apart from HPI, are negative.  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)    Prostate cancer metastatic to bone (HCC)    REM sleep behavior disorder    Sleep apnea    BiPap - uses nightly    Past Surgical History:  Procedure Laterality Date   ANTERIOR APPROACH HEMI HIP  ARTHROPLASTY Right 02/05/2023   Procedure: ANTERIOR APPROACH HEMI HIP ARTHROPLASTY;  Surgeon: Neil Balls, MD;  Location: [facility] OR;  Service: Orthopedics;  Laterality: Right;   APPENDECTOMY  1963   BIOPSY  12/25/2021   Procedure: BIOPSY;  Surgeon: Brice Campi Albino Alu., MD;  Location: [facility] ENDOSCOPY;  Service: Gastroenterology;;   BRONCHIAL BIOPSY  07/05/2023   Procedure: BRONCHIAL BIOPSIES;  Surgeon: Prudy Brownie, DO;  Location: [facility] ENDOSCOPY;  Service: Pulmonary;;   COLONOSCOPY  2010, 2015   ESOPHAGOGASTRODUODENOSCOPY N/A 04/16/2022   Procedure: ESOPHAGOGASTRODUODENOSCOPY (EGD);  Surgeon: Normie Becton., MD;  Location: [facility] ENDOSCOPY;  Service: Gastroenterology;  Laterality: N/A;   ESOPHAGOGASTRODUODENOSCOPY (EGD) WITH PROPOFOL  N/A 12/25/2021   Procedure: ESOPHAGOGASTRODUODENOSCOPY (EGD) WITH PROPOFOL ;  Surgeon: Brice Campi Albino Alu., MD;  Location: [facility] ENDOSCOPY;  Service: Gastroenterology;  Laterality: N/A;   EUS N/A 12/25/2021   Procedure: UPPER ENDOSCOPIC ULTRASOUND (EUS) RADIAL;  Surgeon: Normie Becton., MD;  Location: [facility] ENDOSCOPY;  Service: Gastroenterology;  Laterality: N/A;   EUS N/A 04/16/2022   Procedure: UPPER ENDOSCOPIC ULTRASOUND (EUS) RADIAL;  Surgeon: Normie Becton., MD;  Location: [facility] ENDOSCOPY;  Service: Gastroenterology;  Laterality: N/A;   FIDUCIAL MARKER PLACEMENT N/A 04/16/2022   Procedure: FIDUCIAL MARKER PLACEMENT;  Surgeon: Normie Becton., MD;  Location: [facility] ENDOSCOPY;  Service: Gastroenterology;  Laterality: N/A;   FINE NEEDLE ASPIRATION  12/25/2021   Procedure: FINE NEEDLE ASPIRATION (FNA) LINEAR;  Surgeon: Normie Becton., MD;  Location: [facility] ENDOSCOPY;  Service: Gastroenterology;;   IR IMAGING GUIDED PORT INSERTION  07/28/2023   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   KYPHOPLASTY N/A 08/15/2020   Procedure: L2 compression fracture;  Surgeon: Molli Angelucci, MD;  Location: [facility] ORS;  Service: Orthopedics;  Laterality: N/A;   KYPHOPLASTY N/A  08/29/2020   Procedure: T8 KYPHOPLASTY;  Surgeon: Molli Angelucci, MD;  Location: [facility] ORS;  Service: Orthopedics;  Laterality: N/A;   KYPHOPLASTY N/A 11/12/2020   Procedure: L1 KYPHOPLASTY;  Surgeon: Molli Angelucci, MD;  Location: [facility] ORS;  Service: Orthopedics;  Laterality: N/A;   PORTACATH PLACEMENT N/A 02/05/2022   Procedure: INSERTION PORT-A-CATH;  Surgeon: Lujean Sake, MD;  Location: [facility] ORS;  Service: General;  Laterality: N/A;    Social History:   reports that he quit smoking about 13 years ago. His smoking use included cigars. He has never used smokeless tobacco. He reports that he does not currently use alcohol  after a past usage of about 4.0 standard drinks of alcohol  per week. He reports that he does not use drugs.  Allergies  Allergen Reactions   Influenza Vaccines Anaphylaxis    Flu shot: 1974 anaphylaxis. 2nd time swollen arm   Influenza Virus Vaccine Other (See Comments)    Family History  Problem Relation Age of Onset   Heart disease Maternal Grandfather    Diabetes Paternal Grandfather    Breast cancer Daughter    Colon cancer Neg Hx    Esophageal cancer Neg Hx    Stomach cancer Neg Hx      Prior to Admission medications   Medication Sig Start Date End Date Taking? Authorizing Provider  acetaminophen  (TYLENOL ) 500 MG tablet Take 1 tablet (500 mg total) by mouth every 6 (six) hours as needed for moderate pain or mild pain. Patient taking differently: Take 500 mg by mouth as needed for moderate pain (pain score 4-6) or mild pain (pain score 1-3). 04/18/22   Mansouraty, Albino Alu., MD  amiodarone  (PACERONE ) 200 MG tablet Take 2 tabs twice daily x 7 days until 12/15/23 and from 12/16/23 take 1 tab daily 12/09/23   Lesa Rape, MD  ammonium lactate (LAC-HYDRIN) 12 % lotion Apply 1 Application topically as needed. 06/22/23   [provider]  bisacodyl  (DULCOLAX) 10 MG suppository Place 1 suppository (10 mg total) rectally daily as needed for moderate constipation.  02/09/23   Aura Leeds Latif, DO  carbidopa -levodopa  (SINEMET  CR) 50-200 MG tablet TAKE 1 TABLET AT BEDTIME 10/18/23   Tat, Rebecca S, DO  carbidopa -levodopa  (SINEMET  IR) 25-100 MG tablet Take 3 tablets at 7 AM, 2 tablets at 11 AM, 3 tablets at 3 PM, 2 tablets at 7 PM 10/26/23   Tat, Rebecca S, DO  ciclopirox (PENLAC) 8 % solution Apply 1 Application topically once a week.  09/29/24  [provider]  clonazePAM  (KLONOPIN ) 0.5 MG tablet Take 1 tablet (0.5 mg total) by mouth at bedtime. 02/09/23   Aura Leeds Latif, DO  cyanocobalamin  (VITAMIN B12) 1000 MCG tablet Take 1,000 mcg by mouth daily.    [provider]  ELIQUIS  5 MG TABS tablet TAKE 1 TABLET TWICE A DAY 03/12/23   Tolia, Sunit, DO  furosemide  (LASIX ) 20 MG tablet Take 1 tablet (20 mg total) by mouth daily as needed for fluid or edema. 12/09/23 01/08/24  Lesa Rape, MD  Glycerin -Hypromellose-PEG 400 (VISINE DRY EYE OP) Place 1 drop into both eyes 2 (two) times daily as needed (eye irritation).     [provider]  ketoconazole (NIZORAL) 2 % shampoo Apply 1 application  topically 3 (three) times a week. 02/15/19   [provider]  leuprolide , 6 Month, (ELIGARD ) 45 MG injection Inject 45 mg into the skin every 6 (six) months.    [provider]  lidocaine -prilocaine  (EMLA ) cream Apply to port site 1 to 2 hours prior to appointments/treatment 08/13/23   Sharyon Deis, NP  metoprolol  succinate (TOPROL -XL) 25 MG 24 hr tablet Take 1 tablet (25 mg total) by mouth daily. 12/10/23 01/09/24  Lesa Rape, MD  midodrine  (PROAMATINE ) 5 MG tablet Take 1 tablet (5 mg total) by mouth 3 (three) times daily with meals. 12/09/23 01/08/24  Lesa Rape, MD  omeprazole  (PRILOSEC) 40 MG capsule Take 40 mg by mouth daily. 12/01/23   [provider]  ondansetron  (ZOFRAN ) 8 MG tablet Take 1 tablet (8 mg total) by mouth every 8 (eight) hours as needed for nausea or vomiting. 11/04/23   Sonja , MD  oxyCODONE  (OXY IR/ROXICODONE ) 5 MG  immediate release tablet Take 1 tablet (5 mg total) by mouth every 4 (four) hours as needed for severe pain (pain score 7-10). 12/16/23   Pickenpack-Cousar, Giles Labrum, NP  Pancrelipase , Lip-Prot-Amyl, (PANCREAZE ) 37000-97300 units CPEP TAKE 2 CAPSULES WITH BREAKFAST, LUNCH, AND EVENING MEAL (WITH EACH MEAL) AND 1 CAPSULE WITH SNACK 11/13/23   Kennedy-Smith, Colleen M, NP  polyethylene glycol powder (MIRALAX ) 17 GM/SCOOP powder Take 17 g by mouth in the morning, at noon, and at bedtime.    [provider]  prochlorperazine  (COMPAZINE ) 10 MG tablet Take 1 tablet (10 mg total) by mouth every 6 (six) hours as needed for nausea or vomiting. 09/23/23   Burton, Lacie K, NP  rosuvastatin  (CRESTOR ) 10 MG tablet TAKE 1 TABLET DAILY 07/08/23   Tolia, Sunit, DO  tiZANidine  (ZANAFLEX ) 2 MG tablet Take 1 tablet (2 mg total) by mouth every 8 (eight) hours as needed for muscle spasms. 02/09/23   Sheikh, Omair Latif, DO  triamcinolone  cream (KENALOG ) 0.1 % Apply 1 application topically 2 (two) times daily. Patient taking differently: Apply 1 application  topically 2 (two) times daily as needed (itching). 01/16/21   Clarise Crooks, MD  zoledronic  acid (RECLAST ) 5 MG/100ML SOLN injection Inject 5 mg into the vein See admin instructions. Once a year    [provider]    Physical Exam: Vitals:   12/31/23 2115 12/31/23 2130 12/31/23 2145 12/31/23 2200  BP: (!) 138/102 (!) 125/92 (!) 134/96 (!) 130/98  Pulse: (!) 123 (!) 147 (!) 117 (!) 124  Resp: (!) 28 19 (!) 22 (!) 25  SpO2: 97% 98% 99% 97%  Weight:      Height:         Constitutional: NAD, cachectic   Eyes: PERTLA, lids and conjunctivae normal ENMT: Mucous membranes are moist. Posterior pharynx clear of any exudate or lesions.   Neck: supple, no masses  Respiratory: Diminished bilaterally. Fine rales bilaterally. Dyspneic with speech.  Cardiovascular: S1 & S2 heard, regular rate and rhythm. Pretibial pitting edema. JVD noted. Abdomen: No  tenderness, soft. Bowel sounds active.  Musculoskeletal: no clubbing / cyanosis. No joint deformity upper and lower extremities.   Skin: no significant rashes, lesions, ulcers. Warm, dry, well-perfused. Neurologic: CN 2-12 grossly intact. Moving all extremities. Alert and oriented.  Psychiatric: Calm. Cooperative.    Labs and Imaging on Admission: I have personally reviewed following labs and imaging studies  CBC: Recent Labs  Lab 12/28/23 1532 12/30/23 1304 12/31/23 2200 12/31/23 2229  WBC 5.7 7.8 13.4*  --   NEUTROABS  --  5.8 11.7*  --   HGB 8.8* 9.1* 10.8* 12.2*  HCT 27.7* 28.0* 34.1* 36.0*  MCV 96.5 95.6 98.8  --   PLT 139* 172 256  --    Basic Metabolic Panel: Recent Labs  Lab 12/28/23 1532 12/30/23 1304 12/31/23 2200 12/31/23 2229  NA 137 139 137 138  K 3.9 4.0 4.6 4.7  CL 104 109 107 108  CO2 22 26 19*  --   GLUCOSE 100* 140* 118* 113*  BUN 27* 30* 24* 31*  CREATININE 0.94 0.91 1.08 1.00  CALCIUM  9.1 9.0 9.2  --    GFR: Estimated Creatinine Clearance: 47.7 mL/min (by C-G formula based on SCr of 1 mg/dL). Liver Function Tests: Recent Labs  Lab 12/28/23 1532 12/30/23 1304 12/31/23 2200  AST 120* 78* 105*  ALT 16 12 12   ALKPHOS 100 102 131*  BILITOT 0.8 1.0 1.8*  PROT 5.9* 6.3* 7.3  ALBUMIN  3.6 3.6 3.5   No results for input(s): "LIPASE", "AMYLASE" in the last 168 hours. No results for input(s): "AMMONIA" in the last 168 hours. Coagulation Profile: No results for input(s): "INR", "PROTIME" in the last 168 hours. Cardiac Enzymes: No results for input(s): "CKTOTAL", "CKMB", "CKMBINDEX", "TROPONINI" in the last 168 hours. BNP (last 3 results) Recent Labs    12/28/23 1532  PROBNP 10,756.0*   HbA1C: No results for input(s): "HGBA1C" in the last 72 hours. CBG: Recent Labs  Lab 12/31/23 2003  GLUCAP 116*   Lipid Profile: No results for input(s): "CHOL", "HDL", "LDLCALC", "TRIG", "CHOLHDL", "LDLDIRECT" in the last 72 hours. Thyroid Function  Tests: No results for input(s): "TSH", "T4TOTAL", "FREET4", "T3FREE", "THYROIDAB" in the last 72 hours. Anemia Panel: No results for input(s): "VITAMINB12", "FOLATE", "FERRITIN", "TIBC", "IRON", "RETICCTPCT" in the last 72 hours. Urine analysis:    Component Value Date/Time   COLORURINE YELLOW 07/31/2023 1422   APPEARANCEUR CLEAR 07/31/2023 1422   APPEARANCEUR Clear 03/20/2019 1412   LABSPEC 1.013 07/31/2023 1422   PHURINE 6.5 07/31/2023 1422   GLUCOSEU NEGATIVE 07/31/2023 1422   HGBUR NEGATIVE 07/31/2023 1422   BILIRUBINUR NEGATIVE 07/31/2023 1422   BILIRUBINUR Negative 03/20/2019 1412   KETONESUR NEGATIVE 07/31/2023 1422   PROTEINUR NEGATIVE 07/31/2023 1422   NITRITE NEGATIVE 07/31/2023 1422   LEUKOCYTESUR NEGATIVE 07/31/2023 1422   Sepsis Labs: @LABRCNTIP (procalcitonin:4,lacticidven:4) )No results found for this or any previous visit (from the past 240 hours).   Radiological Exams on Admission: DG Chest Port 1 View Result Date: 12/31/2023 CLINICAL DATA:  Chest pain. Shortness of breath. Tachycardia. Pancreatic cancer. EXAM: PORTABLE CHEST 1 VIEW COMPARISON:  Two-view chest x-ray 12/28/2023. FINDINGS: A right IJ Port-A-Cath is stable. The heart is enlarged. New bilateral pleural effusions are present, right greater than left. Mild edema is now present, also worse on the right. Bibasilar airspace opacities are worse on the right. IMPRESSION: 1. New bilateral pleural effusions, right greater than left. 2. Mild edema, also worse on the right. 3. Bibasilar airspace disease is worse on the right. This likely reflects atelectasis. Infection or aspiration is not excluded. Electronically Signed   By: Audree Leas M.D.   On: 12/31/2023 20:04    EKG: Independently reviewed. Atrial fibrillation, rate 101, RBBB, LAFB.   Assessment/Plan   1. Acute on chronic HFrEF  - Continue diuresis with IV Lasix , monitor weight and I/Os, continue beta-blocker as tolerated    2. Rapid atrial  fibrillation  - Continue Toprol  and amiodarone , use IV  Lopressor  as-needed, continue Eliquis     3. Parkinson disease  - Sinemet     4. OSA  - BiPAP while sleeping    5. Pancreatic cancer  - Receiving treatment with gemcitabine  under the care of Dr. Maryalice Smaller    6. Prostate cancer  - Metastatic to bone, managed with leuprolide     7. Pleural effusions  - Consider thoracentesis if fail to respond to diuresis    DVT prophylaxis: Eliquis   Code Status: Full  Level of Care: Level of care: Progressive Family Communication: Son at bedside  Disposition Plan:  Patient is from: Home  Anticipated d/c is to: TBD Anticipated d/c date is: 01/03/24  Patient currently: Pending improved volume status  Consults called: None  Admission status: Inpatient     Walton Guppy, MD [facility]  12/31/2023, 11:01 PM

## 2024-01-01 DIAGNOSIS — I5023 Acute on chronic systolic (congestive) heart failure: Secondary | ICD-10-CM | POA: Diagnosis not present

## 2024-01-01 LAB — HEPATIC FUNCTION PANEL
ALT: 8 U/L (ref 0–44)
AST: 70 U/L — ABNORMAL HIGH (ref 15–41)
Albumin: 3.2 g/dL — ABNORMAL LOW (ref 3.5–5.0)
Alkaline Phosphatase: 106 U/L (ref 38–126)
Bilirubin, Direct: 0.5 mg/dL — ABNORMAL HIGH (ref 0.0–0.2)
Indirect Bilirubin: 1.3 mg/dL — ABNORMAL HIGH (ref 0.3–0.9)
Total Bilirubin: 1.8 mg/dL — ABNORMAL HIGH (ref 0.0–1.2)
Total Protein: 6.5 g/dL (ref 6.5–8.1)

## 2024-01-01 LAB — CBC
HCT: 31.4 % — ABNORMAL LOW (ref 39.0–52.0)
Hemoglobin: 10.3 g/dL — ABNORMAL LOW (ref 13.0–17.0)
MCH: 32.3 pg (ref 26.0–34.0)
MCHC: 32.8 g/dL (ref 30.0–36.0)
MCV: 98.4 fL (ref 80.0–100.0)
Platelets: 222 10*3/uL (ref 150–400)
RBC: 3.19 MIL/uL — ABNORMAL LOW (ref 4.22–5.81)
RDW: 20.9 % — ABNORMAL HIGH (ref 11.5–15.5)
WBC: 15.5 10*3/uL — ABNORMAL HIGH (ref 4.0–10.5)
nRBC: 0.1 % (ref 0.0–0.2)

## 2024-01-01 LAB — BASIC METABOLIC PANEL WITH GFR
Anion gap: 13 (ref 5–15)
BUN: 22 mg/dL (ref 8–23)
CO2: 23 mmol/L (ref 22–32)
Calcium: 8.9 mg/dL (ref 8.9–10.3)
Chloride: 102 mmol/L (ref 98–111)
Creatinine, Ser: 1.13 mg/dL (ref 0.61–1.24)
GFR, Estimated: >60 mL/min (ref >60)
Glucose, Bld: 123 mg/dL — ABNORMAL HIGH (ref 70–99)
Potassium: 3.5 mmol/L (ref 3.5–5.1)
Sodium: 138 mmol/L (ref 135–145)

## 2024-01-01 LAB — MAGNESIUM: Magnesium: 1.9 mg/dL (ref 1.7–2.4)

## 2024-01-01 MED ORDER — FLEET ENEMA RE ENEM
1.0000 | ENEMA | RECTAL | Status: DC | PRN
Start: 2024-01-01 — End: 2024-01-05
  Filled 2024-01-01: qty 1

## 2024-01-01 MED ORDER — MIDODRINE HCL 5 MG PO TABS
10.0000 mg | ORAL_TABLET | Freq: Three times a day (TID) | ORAL | Status: DC
  Administered 2024-01-01 – 2024-01-05 (×13): 10 mg via ORAL
  Filled 2024-01-01 (×13): qty 2

## 2024-01-01 MED ORDER — CHLORHEXIDINE GLUCONATE CLOTH 2 % EX PADS
6.0000 | MEDICATED_PAD | Freq: Every day | CUTANEOUS | Status: DC
  Administered 2024-01-01: 6 via TOPICAL

## 2024-01-01 MED ORDER — BISACODYL 5 MG PO TBEC
5.0000 mg | DELAYED_RELEASE_TABLET | ORAL | Status: DC | PRN
  Administered 2024-01-01: 5 mg via ORAL
  Filled 2024-01-01: qty 1

## 2024-01-01 MED ORDER — POLYETHYLENE GLYCOL 3350 17 G PO PACK
17.0000 g | PACK | Freq: Every day | ORAL | Status: DC
  Administered 2024-01-01 – 2024-01-05 (×5): 17 g via ORAL
  Filled 2024-01-01 (×4): qty 1

## 2024-01-01 NOTE — Plan of Care (Signed)
  Problem: Education: Goal: Ability to demonstrate management of disease process will improve Outcome: Progressing   Problem: Education: Goal: Ability to verbalize understanding of medication therapies will improve Outcome: Progressing   Problem: Education: Goal: Individualized Educational Video(s) Outcome: Progressing   Problem: Health Behavior/Discharge Planning: Goal: Ability to safely manage health-related needs after discharge will improve Outcome: Progressing   Problem: Cardiac: Goal: Ability to achieve and maintain adequate cardiopulmonary perfusion will improve Outcome: Progressing

## 2024-01-01 NOTE — ED Notes (Signed)
 Provided and set up patients meal tray.

## 2024-01-01 NOTE — ED Notes (Signed)
Pt placed on bedpan per request.

## 2024-01-01 NOTE — Progress Notes (Signed)
 PROGRESS NOTE    [name] [name]  MVH:[id number] DOB: Mar 07, 1941 DOA: 12/31/2023 PCP: Arva Lathe, MD  82/M w metastatic prostate cancer, pancreatic cancer, lung metastasis, Parkinson's disease, chronic systolic CHF, paroxysmal A-fib, OSA on BiPAP, protein calorie malnutrition, recent hospitalization earlier this month with CHF back in the ED with worsening shortness of breath, fatigue.  Also reports 20+ pounds weight loss over the last few months -In the ER A-fib with RVR, given IV diltiazem  - Labs with BNP 1757, troponin 17, 23, creatinine 1.08, hemoglobin 10.8, WBC 13.4, Chest x-ray demonstrates new bilateral pleural effusions and pulmonary edema. -He is followed by outpatient palliative care   Subjective: -Complains of weakness, fatigue, some dyspnea  Assessment and Plan:  Acute on chronic systolic CHF Pleural effusions - Last echo 4/25 with EF 35%, moderate LVH, mildly reduced RV - Continue IV Lasix  and Toprol  - GDMT limited by hypotension - Continue midodrine  - Overall prognosis is very poor in the setting of CHF, multiple advanced cancers, failure to thrive, malnutrition, Parkinson's disease etc. will request palliative care eval as inpatient - Discussed this with patient and son, recommended DNR, I think he is most appropriate for hospice but seems to be in denial  A-fib with RVR -Improved, continue Toprol  and amiodarone  - Continue Eliquis   Parkinson's disease Dysphagia - Continue Sinemet , check SLP eval  Pancreatic cancer Known Pulmonary mets, unclear if this is related to pancreatic CA or prostate -Followed by oncology, on gemcitabine  per Dr. Maryalice Smaller - Followed by outpatient palliative care  Metastatic prostate cancer - Bone metastasis, on leuprolide   OSA - Continue nightly BiPAP  Protein calorie malnutrition, cachexia Adult failure to thrive   DVT prophylaxis: Apixaban  Code Status: Full code, strongly recommended consideration of DNR Family  Communication: Son at bedside Disposition Plan: To be determined    Objective: Vitals:   01/01/24 1000 01/01/24 1038 01/01/24 1039 01/01/24 1105  BP: 94/67 99/76  (!) 93/51  Pulse: 98 96 95 94  Resp: 16 (!) 25 19 20   Temp:    97.7 F (36.5 C)  TempSrc:    Oral  SpO2: 98% (!) 89% 100% 97%  Weight:      Height:        Intake/Output Summary (Last 24 hours) at 01/01/2024 1139 Last data filed at 01/01/2024 1055 Gross per 24 hour  Intake 3 ml  Output 3500 ml  Net -3497 ml   Filed Weights   12/31/23 1941  Weight: 60.3 kg    Examination:  Frail cachectic elderly male laying in bed, AAO x 3 HEENT: Positive JVD CVS: S1-S2, systolic murmur Lungs: Decreased breath sounds at the bases Abdomen: Soft, nontender, bowel sounds present Extremities: Trace edema  Psychiatry:  Mood & affect appropriate.     Data Reviewed:   CBC: Recent Labs  Lab 12/28/23 1532 12/30/23 1304 12/31/23 2200 12/31/23 2229 01/01/24 0345  WBC 5.7 7.8 13.4*  --  15.5*  NEUTROABS  --  5.8 11.7*  --   --   HGB 8.8* 9.1* 10.8* 12.2* 10.3*  HCT 27.7* 28.0* 34.1* 36.0* 31.4*  MCV 96.5 95.6 98.8  --  98.4  PLT 139* 172 256  --  222   Basic Metabolic Panel: Recent Labs  Lab 12/28/23 1532 12/30/23 1304 12/31/23 2200 12/31/23 2229 01/01/24 0345  NA 137 139 137 138 138  K 3.9 4.0 4.6 4.7 3.5  CL 104 109 107 108 102  CO2 22 26 19*  --  23  GLUCOSE 100* 140* 118* 113*  123*  BUN 27* 30* 24* 31* 22  CREATININE 0.94 0.91 1.08 1.00 1.13  CALCIUM  9.1 9.0 9.2  --  8.9  MG  --   --   --   --  1.9   GFR: Estimated Creatinine Clearance: 42.2 mL/min (by C-G formula based on SCr of 1.13 mg/dL). Liver Function Tests: Recent Labs  Lab 12/28/23 1532 12/30/23 1304 12/31/23 2200 01/01/24 0345  AST 120* 78* 105* 70*  ALT 16 12 12 8   ALKPHOS 100 102 131* 106  BILITOT 0.8 1.0 1.8* 1.8*  PROT 5.9* 6.3* 7.3 6.5  ALBUMIN  3.6 3.6 3.5 3.2*   No results for input(s): "LIPASE", "AMYLASE" in the last 168  hours. No results for input(s): "AMMONIA" in the last 168 hours. Coagulation Profile: No results for input(s): "INR", "PROTIME" in the last 168 hours. Cardiac Enzymes: No results for input(s): "CKTOTAL", "CKMB", "CKMBINDEX", "TROPONINI" in the last 168 hours. BNP (last 3 results) Recent Labs    12/28/23 1532  PROBNP 10,756.0*   HbA1C: No results for input(s): "HGBA1C" in the last 72 hours. CBG: Recent Labs  Lab 12/31/23 2003  GLUCAP 116*   Lipid Profile: No results for input(s): "CHOL", "HDL", "LDLCALC", "TRIG", "CHOLHDL", "LDLDIRECT" in the last 72 hours. Thyroid Function Tests: No results for input(s): "TSH", "T4TOTAL", "FREET4", "T3FREE", "THYROIDAB" in the last 72 hours. Anemia Panel: No results for input(s): "VITAMINB12", "FOLATE", "FERRITIN", "TIBC", "IRON", "RETICCTPCT" in the last 72 hours. Urine analysis:    Component Value Date/Time   COLORURINE YELLOW 07/31/2023 1422   APPEARANCEUR CLEAR 07/31/2023 1422   APPEARANCEUR Clear 03/20/2019 1412   LABSPEC 1.013 07/31/2023 1422   PHURINE 6.5 07/31/2023 1422   GLUCOSEU NEGATIVE 07/31/2023 1422   HGBUR NEGATIVE 07/31/2023 1422   BILIRUBINUR NEGATIVE 07/31/2023 1422   BILIRUBINUR Negative 03/20/2019 1412   KETONESUR NEGATIVE 07/31/2023 1422   PROTEINUR NEGATIVE 07/31/2023 1422   NITRITE NEGATIVE 07/31/2023 1422   LEUKOCYTESUR NEGATIVE 07/31/2023 1422   Sepsis Labs: @LABRCNTIP (procalcitonin:4,lacticidven:4)  )No results found for this or any previous visit (from the past 240 hours).   Radiology Studies: DG Chest Port 1 View Result Date: 12/31/2023 CLINICAL DATA:  Chest pain. Shortness of breath. Tachycardia. Pancreatic cancer. EXAM: PORTABLE CHEST 1 VIEW COMPARISON:  Two-view chest x-ray 12/28/2023. FINDINGS: A right IJ Port-A-Cath is stable. The heart is enlarged. New bilateral pleural effusions are present, right greater than left. Mild edema is now present, also worse on the right. Bibasilar airspace opacities are  worse on the right. IMPRESSION: 1. New bilateral pleural effusions, right greater than left. 2. Mild edema, also worse on the right. 3. Bibasilar airspace disease is worse on the right. This likely reflects atelectasis. Infection or aspiration is not excluded. Electronically Signed   By: Audree Leas M.D.   On: 12/31/2023 20:04     Scheduled Meds:  amiodarone   200 mg Oral Daily   apixaban   5 mg Oral BID   carbidopa -levodopa   1 tablet Oral QHS   carbidopa -levodopa   3 tablet Oral BID   And   carbidopa -levodopa   2 tablet Oral BID   clonazePAM   0.5 mg Oral QHS   furosemide   40 mg Intravenous Q12H   metoprolol  succinate  25 mg Oral Daily   midodrine   10 mg Oral TID WC   pantoprazole   40 mg Oral Daily   rosuvastatin   10 mg Oral Daily   sodium chloride  flush  3 mL Intravenous Q12H   Continuous Infusions:   LOS: 1 day  Time spent:    Deforest Fast, MD [facility]   01/01/2024, 11:39 AM

## 2024-01-01 NOTE — ED Notes (Signed)
 Placed patient on hospital bed. Applied 2L [place] for comfort.

## 2024-01-02 ENCOUNTER — Inpatient Hospital Stay (HOSPITAL_COMMUNITY)

## 2024-01-02 DIAGNOSIS — Z515 Encounter for palliative care: Secondary | ICD-10-CM

## 2024-01-02 DIAGNOSIS — R627 Adult failure to thrive: Secondary | ICD-10-CM

## 2024-01-02 DIAGNOSIS — I5023 Acute on chronic systolic (congestive) heart failure: Secondary | ICD-10-CM | POA: Diagnosis not present

## 2024-01-02 DIAGNOSIS — Z7189 Other specified counseling: Secondary | ICD-10-CM

## 2024-01-02 LAB — COMPREHENSIVE METABOLIC PANEL WITH GFR
ALT: 10 U/L (ref 0–44)
AST: 40 U/L (ref 15–41)
Albumin: 2.8 g/dL — ABNORMAL LOW (ref 3.5–5.0)
Alkaline Phosphatase: 90 U/L (ref 38–126)
Anion gap: 12 (ref 5–15)
BUN: 26 mg/dL — ABNORMAL HIGH (ref 8–23)
CO2: 25 mmol/L (ref 22–32)
Calcium: 8.8 mg/dL — ABNORMAL LOW (ref 8.9–10.3)
Chloride: 100 mmol/L (ref 98–111)
Creatinine, Ser: 0.92 mg/dL (ref 0.61–1.24)
GFR, Estimated: >60 mL/min (ref >60)
Glucose, Bld: 117 mg/dL — ABNORMAL HIGH (ref 70–99)
Potassium: 3.5 mmol/L (ref 3.5–5.1)
Sodium: 137 mmol/L (ref 135–145)
Total Bilirubin: 1.6 mg/dL — ABNORMAL HIGH (ref 0.0–1.2)
Total Protein: 5.9 g/dL — ABNORMAL LOW (ref 6.5–8.1)

## 2024-01-02 LAB — URINALYSIS, ROUTINE W REFLEX MICROSCOPIC
Bilirubin Urine: NEGATIVE
Glucose, UA: NEGATIVE mg/dL
Hgb urine dipstick: NEGATIVE
Ketones, ur: 5 mg/dL — AB
Leukocytes,Ua: NEGATIVE
Nitrite: NEGATIVE
Protein, ur: NEGATIVE mg/dL
Specific Gravity, Urine: 1.012 (ref 1.005–1.030)
pH: 5 (ref 5.0–8.0)

## 2024-01-02 LAB — CBC
HCT: 32 % — ABNORMAL LOW (ref 39.0–52.0)
Hemoglobin: 10.4 g/dL — ABNORMAL LOW (ref 13.0–17.0)
MCH: 30.8 pg (ref 26.0–34.0)
MCHC: 32.5 g/dL (ref 30.0–36.0)
MCV: 94.7 fL (ref 80.0–100.0)
Platelets: 302 10*3/uL (ref 150–400)
RBC: 3.38 MIL/uL — ABNORMAL LOW (ref 4.22–5.81)
RDW: 20.6 % — ABNORMAL HIGH (ref 11.5–15.5)
WBC: 18.9 10*3/uL — ABNORMAL HIGH (ref 4.0–10.5)
nRBC: 0 % (ref 0.0–0.2)

## 2024-01-02 MED ORDER — BISACODYL 10 MG RE SUPP: 10.0000 mg | Freq: Once | RECTAL | Status: DC

## 2024-01-02 MED ORDER — LACTULOSE 10 GM/15ML PO SOLN
20.0000 g | Freq: Two times a day (BID) | ORAL | Status: DC
  Administered 2024-01-02: 20 g via ORAL
  Filled 2024-01-02: qty 30

## 2024-01-02 MED ORDER — POTASSIUM CHLORIDE CRYS ER 20 MEQ PO TBCR: 20.0000 meq | EXTENDED_RELEASE_TABLET | Freq: Once | ORAL | Status: DC

## 2024-01-02 NOTE — Evaluation (Signed)
 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)    Prostate cancer metastatic to bone (HCC)    REM sleep behavior disorder    Sleep apnea    BiPap - uses nightly   Past Surgical History:  Past Surgical History:  Procedure Laterality Date   ANTERIOR APPROACH HEMI HIP ARTHROPLASTY Right 02/05/2023   Procedure: ANTERIOR APPROACH HEMI HIP ARTHROPLASTY;  Surgeon: Neil Balls, MD;  Location: [facility] OR;  Service: Orthopedics;  Laterality: Right;   APPENDECTOMY  1963   BIOPSY  12/25/2021   Procedure: BIOPSY;  Surgeon: Brice Campi Albino Alu., MD;  Location: [facility] ENDOSCOPY;  Service: Gastroenterology;;   BRONCHIAL BIOPSY  07/05/2023   Procedure: BRONCHIAL BIOPSIES;  Surgeon: Prudy Brownie, DO;  Location: [facility] ENDOSCOPY;  Service: Pulmonary;;   COLONOSCOPY  2010, 2015   ESOPHAGOGASTRODUODENOSCOPY N/A 04/16/2022   Procedure: ESOPHAGOGASTRODUODENOSCOPY (EGD);  Surgeon: Normie Becton., MD;  Location: [facility] ENDOSCOPY;  Service: Gastroenterology;  Laterality: N/A;   ESOPHAGOGASTRODUODENOSCOPY (EGD) WITH PROPOFOL  N/A 12/25/2021   Procedure: ESOPHAGOGASTRODUODENOSCOPY (EGD) WITH PROPOFOL ;  Surgeon: Brice Campi Albino Alu., MD;  Location: [facility] ENDOSCOPY;  Service: Gastroenterology;  Laterality: N/A;   EUS N/A 12/25/2021   Procedure: UPPER ENDOSCOPIC ULTRASOUND (EUS) RADIAL;  Surgeon: Normie Becton., MD;  Location: [facility] ENDOSCOPY;  Service:  Gastroenterology;  Laterality: N/A;   EUS N/A 04/16/2022   Procedure: UPPER ENDOSCOPIC ULTRASOUND (EUS) RADIAL;  Surgeon: Normie Becton., MD;  Location: [facility] ENDOSCOPY;  Service: Gastroenterology;  Laterality: N/A;   FIDUCIAL MARKER PLACEMENT N/A 04/16/2022   Procedure: FIDUCIAL MARKER PLACEMENT;  Surgeon: Normie Becton., MD;  Location: [facility] ENDOSCOPY;  Service: Gastroenterology;  Laterality: N/A;   FINE NEEDLE ASPIRATION  12/25/2021   Procedure: FINE NEEDLE ASPIRATION (FNA) LINEAR;  Surgeon: Normie Becton., MD;  Location: [facility] ENDOSCOPY;  Service: Gastroenterology;;   IR IMAGING GUIDED PORT INSERTION  07/28/2023   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   KYPHOPLASTY N/A 08/15/2020   Procedure: L2 compression fracture;  Surgeon: Molli Angelucci, MD;  Location: [facility] ORS;  Service: Orthopedics;  Laterality: N/A;   KYPHOPLASTY N/A 08/29/2020   Procedure: T8 KYPHOPLASTY;  Surgeon: Molli Angelucci, MD;  Location: [facility] ORS;  Service: Orthopedics;  Laterality: N/A;   KYPHOPLASTY N/A 11/12/2020   Procedure: L1 KYPHOPLASTY;  Surgeon: Molli Angelucci, MD;  Location: [facility] ORS;  Service: Orthopedics;  Laterality: N/A;   PORTACATH PLACEMENT N/A 02/05/2022   Procedure: INSERTION PORT-A-CATH;  Surgeon: Lujean Sake, MD;  Location: [facility] ORS;  Service: General;  Laterality: N/A;   HPI:  Pt is an 83 y.o. male who presented 4/25 with worsening shortness of breath, leg swelling, and fatigue. CXR 4/27: mild CHF. PMH: Parkinson disease, atrial fibrillation on Eliquis , chronic HFrEF, prostate cancer metastatic to bone, pancreatic cancer on active treatment, OSA managed with BiPAP, and admission earlier in April for acute on chronic HFrEF. 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 MBS  11/10/23: moderate pharyngeal dysphagia with sensorimotor deficits characterized by impaired tongue base retraction, pharyngeal motility and laryngeal elevation/closure resulting in retention,  laryngeal penetration, and trace silent aspiration of thin liquids with sequential swallows. A regular/dysphagia 3 diet with thin liquids was recommended with observance of the following strategies: Small bites/sips; Slow rate; Multiple dry swallows after each bite/sip; Follow solids with liquids.    Assessment / Plan / Recommendation  Clinical Impression  Pt was seen for bedside swallow evaluation. He reported that he tends to cough with liquids if he takes too large of a sip and that his preference is to take pills whole with a smoothie or applesauce. Pt stated that he consumes softer solids and attributed the coughing with pills this morning to his taking too large of a sip and not using a thicker liquid/applesauce to take the pills. Dentition was natural and adequate. Consistent with his most recent MBS in March, he presented with symptoms of pharyngeal dysphagia characterized by signs of aspiration with consecutive swallows/large boluses of thin liquids, multiple swallows with solids and report of sensing pharyngeal residue which was improved with use of liquid washes. Pt advised that he would prefer to be on a softer diet. A dysphagia 3 diet with thin liquids is therefore recommended with observance of swallowing precautions as noted below. SLP will follow pt. SLP Visit Diagnosis: Dysphagia, oropharyngeal phase (R13.12)    Aspiration Risk  Mild aspiration risk    Diet Recommendation Regular;Thin liquid    Liquid Administration via: Cup;Straw (if pt prefers to use straw, recommend small bolus sizes and avoidance of consecutive swallows) Medication Administration: Whole meds with puree Supervision: Patient able to self feed;Intermittent supervision to cue for compensatory strategies Compensations: Slow rate;Small sips/bites;Follow solids with liquid;Multiple dry swallows after each bite/sip Postural Changes: Seated upright at 90 degrees    Other  Recommendations Oral Care Recommendations: Oral  care BID    Recommendations for follow up therapy are one component of a multi-disciplinary discharge planning process, led by the attending physician.  Recommendations may be updated based on patient status, additional functional criteria and insurance authorization.  Follow up Recommendations Outpatient SLP      Assistance Recommended at Discharge    Functional Status Assessment Patient has had a recent decline in their functional status and demonstrates the ability to make significant improvements in function in a reasonable and predictable amount of time.  Frequency and Duration min 2x/week  2 weeks       Prognosis Prognosis for improved oropharyngeal function: Good      Swallow Study   General Date of Onset: 11/10/23 HPI: Pt is an 83 y.o. male who presented 4/25 with worsening shortness of breath, leg swelling, and fatigue. CXR 4/27: mild CHF. PMH: Parkinson disease, atrial fibrillation on Eliquis , chronic HFrEF, prostate cancer metastatic to bone, pancreatic cancer on active treatment, OSA managed with BiPAP, and admission earlier in April for acute on chronic HFrEF. 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 MBS 11/10/23: moderate pharyngeal dysphagia with sensorimotor deficits characterized by impaired tongue base retraction, pharyngeal motility and laryngeal elevation/closure resulting in retention, laryngeal penetration, and trace silent aspiration of thin liquids with sequential swallows. A regular/dysphagia 3 diet with thin liquids was recommended with observance of the following strategies: Small bites/sips; Slow rate; Multiple dry swallows after each bite/sip; Follow solids with liquids. Type of Study: Bedside Swallow Evaluation Previous Swallow Assessment: See HPI Diet Prior to this Study: Regular;Thin liquids (Level 0)  Temperature Spikes Noted: No Respiratory Status: Nasal cannula History of Recent Intubation:  No Behavior/Cognition: Alert;Cooperative;Pleasant mood Oral Cavity Assessment: Within Functional Limits Oral Care Completed by SLP: No Oral Cavity - Dentition: Adequate natural dentition Vision: Functional for self-feeding Self-Feeding Abilities: Able to feed self Patient Positioning: Upright in bed Baseline Vocal Quality: Low vocal intensity Volitional Cough: Weak Volitional Swallow: Able to elicit    Oral/Motor/Sensory Function Overall Oral Motor/Sensory Function: Generalized oral weakness   Ice Chips Ice chips: Within functional limits Presentation: Spoon   Thin Liquid Thin Liquid: Impaired Presentation: Cup;Straw Pharyngeal  Phase Impairments: Cough - Immediate;Multiple swallows    Nectar Thick Nectar Thick Liquid: Not tested   Honey Thick Honey Thick Liquid: Not tested   Puree Puree: Impaired Presentation: Spoon Pharyngeal Phase Impairments: Multiple swallows   Solid     Solid: Impaired Presentation: Self Fed Pharyngeal Phase Impairments: Multiple swallows     Brantlee Penn I. Valda Garnet, MS, CCC-SLP Acute Rehabilitation Services Office number [phone]  Toya Friar 01/02/2024,3:31 PM

## 2024-01-02 NOTE — Plan of Care (Signed)
  Problem: Education: Goal: Ability to demonstrate management of disease process will improve Outcome: Progressing Goal: Ability to verbalize understanding of medication therapies will improve Outcome: Progressing Goal: Individualized Educational Video(s) Outcome: Progressing   Problem: Activity: Goal: Capacity to carry out activities will improve Outcome: Progressing   Problem: Cardiac: Goal: Ability to achieve and maintain adequate cardiopulmonary perfusion will improve Outcome: Progressing   Problem: Education: Goal: Knowledge of disease or condition will improve Outcome: Progressing Goal: Understanding of medication regimen will improve Outcome: Progressing Goal: Individualized Educational Video(s) Outcome: Progressing   Problem: Activity: Goal: Ability to tolerate increased activity will improve Outcome: Progressing   Problem: Cardiac: Goal: Ability to achieve and maintain adequate cardiopulmonary perfusion will improve Outcome: Progressing   Problem: Health Behavior/Discharge Planning: Goal: Ability to safely manage health-related needs after discharge will improve Outcome: Progressing   Problem: Education: Goal: Knowledge of General Education information will improve Description: Including pain rating scale, medication(s)/side effects and non-pharmacologic comfort measures Outcome: Progressing   Problem: Health Behavior/Discharge Planning: Goal: Ability to manage health-related needs will improve Outcome: Progressing   Problem: Clinical Measurements: Goal: Ability to maintain clinical measurements within normal limits will improve Outcome: Progressing Goal: Will remain free from infection Outcome: Progressing Goal: Diagnostic test results will improve Outcome: Progressing Goal: Respiratory complications will improve Outcome: Progressing Goal: Cardiovascular complication will be avoided Outcome: Progressing   Problem: Activity: Goal: Risk for activity  intolerance will decrease Outcome: Progressing   Problem: Nutrition: Goal: Adequate nutrition will be maintained Outcome: Progressing   Problem: Coping: Goal: Level of anxiety will decrease Outcome: Progressing   Problem: Elimination: Goal: Will not experience complications related to bowel motility Outcome: Progressing Goal: Will not experience complications related to urinary retention Outcome: Progressing   Problem: Pain Managment: Goal: General experience of comfort will improve and/or be controlled Outcome: Progressing   Problem: Safety: Goal: Ability to remain free from injury will improve Outcome: Progressing   Problem: Skin Integrity: Goal: Risk for impaired skin integrity will decrease Outcome: Progressing

## 2024-01-02 NOTE — Progress Notes (Signed)
 PROGRESS NOTE    [name] [name]  NWG:[id number] DOB: [birth year] DOA: 12/31/2023 PCP: Arva Lathe, MD  82/M w metastatic prostate cancer, pancreatic cancer, lung metastasis, Parkinson's disease, chronic systolic CHF, paroxysmal A-fib, OSA on BiPAP, protein calorie malnutrition, recent hospitalization earlier this month with CHF back in the ED with worsening shortness of breath, fatigue.  Also reports 20+ pounds weight loss over the last few months -In the ER A-fib with RVR, given IV diltiazem  - Labs with BNP 1757, troponin 17, 23, creatinine 1.08, hemoglobin 10.8, WBC 13.4, Chest x-ray demonstrates new bilateral pleural effusions and pulmonary edema. -He is followed by outpatient palliative care   Subjective: - Breathing a little better, still fairly weak overall  Assessment and Plan:  Acute on chronic systolic CHF Pleural effusions - Last echo 4/25 with EF 35%, moderate LVH, mildly reduced RV - Continue IV Lasix  and Toprol , diuresed 4 L yesterday - GDMT limited by hypotension - Continue midodrine  - Overall prognosis is very poor in the setting of CHF, multiple advanced cancers, failure to thrive, malnutrition, Parkinson's disease etc. requested palliative care eval as inpatient - Discussed this with patient and son at length yesterday, recommended DNR, I think he is most appropriate for hospice but seems to be in denial  Worsening leukocytosis - At risk of aspiration, check chest x-ray and UA - Procalcitonin and CBC in a.m.  A-fib with RVR -Improved, continue Toprol  and amiodarone  - Continue Eliquis   Parkinson's disease Dysphagia - Continue Sinemet , check SLP eval  Pancreatic cancer Known Pulmonary mets, unclear if this is related to pancreatic CA or prostate -Followed by oncology, on gemcitabine  per Dr. Maryalice Smaller - Followed by outpatient palliative care  Metastatic prostate cancer - Bone metastasis, on leuprolide   OSA - Continue nightly BiPAP  Protein calorie  malnutrition, cachexia Adult failure to thrive   DVT prophylaxis: Apixaban  Code Status: Full code, strongly recommended consideration of DNR Family Communication: Son at bedside Disposition Plan: To be determined    Objective: Vitals:   01/02/24 0332 01/02/24 0520 01/02/24 0600 01/02/24 0743  BP:   110/79 108/87  Pulse: 97 89 97 98  Resp: 17 16 17 18   Temp: 98.4 F (36.9 C)   (!) 97.1 F (36.2 C)  TempSrc:    Axillary  SpO2: 98% 99% 98%   Weight:  27.9 kg    Height:        Intake/Output Summary (Last 24 hours) at 01/02/2024 1059 Last data filed at 01/02/2024 0500 Gross per 24 hour  Intake 240.9 ml  Output 900 ml  Net -659.1 ml   Filed Weights   12/31/23 1941 01/01/24 1532 01/02/24 0520  Weight: 60.3 kg 22 kg 27.9 kg    Examination:  Frail cachectic elderly male laying in bed, AAO x 3 HEENT: Positive JVD CVS: S1-S2, systolic murmur Lungs: Decreased breath sounds at the bases Abdomen: Soft, nontender, bowel sounds present Extremities: Trace edema  Psychiatry:  Mood & affect appropriate.     Data Reviewed:   CBC: Recent Labs  Lab 12/28/23 1532 12/30/23 1304 12/31/23 2200 12/31/23 2229 01/01/24 0345 01/02/24 0216  WBC 5.7 7.8 13.4*  --  15.5* 18.9*  NEUTROABS  --  5.8 11.7*  --   --   --   HGB 8.8* 9.1* 10.8* 12.2* 10.3* 10.4*  HCT 27.7* 28.0* 34.1* 36.0* 31.4* 32.0*  MCV 96.5 95.6 98.8  --  98.4 94.7  PLT 139* 172 256  --  222 302   Basic Metabolic Panel: Recent Labs  Lab 12/28/23 1532 12/30/23 1304 12/31/23 2200 12/31/23 2229 01/01/24 0345 01/02/24 0216  NA 137 139 137 138 138 137  K 3.9 4.0 4.6 4.7 3.5 3.5  CL 104 109 107 108 102 100  CO2 22 26 19*  --  23 25  GLUCOSE 100* 140* 118* 113* 123* 117*  BUN 27* 30* 24* 31* 22 26*  CREATININE 0.94 0.91 1.08 1.00 1.13 0.92  CALCIUM  9.1 9.0 9.2  --  8.9 8.8*  MG  --   --   --   --  1.9  --    GFR: Estimated Creatinine Clearance: 24.4 mL/min (by C-G formula based on SCr of 0.92  mg/dL). Liver Function Tests: Recent Labs  Lab 12/28/23 1532 12/30/23 1304 12/31/23 2200 01/01/24 0345 01/02/24 0216  AST 120* 78* 105* 70* 40  ALT 16 12 12 8 10   ALKPHOS 100 102 131* 106 90  BILITOT 0.8 1.0 1.8* 1.8* 1.6*  PROT 5.9* 6.3* 7.3 6.5 5.9*  ALBUMIN  3.6 3.6 3.5 3.2* 2.8*   No results for input(s): "LIPASE", "AMYLASE" in the last 168 hours. No results for input(s): "AMMONIA" in the last 168 hours. Coagulation Profile: No results for input(s): "INR", "PROTIME" in the last 168 hours. Cardiac Enzymes: No results for input(s): "CKTOTAL", "CKMB", "CKMBINDEX", "TROPONINI" in the last 168 hours. BNP (last 3 results) Recent Labs    12/28/23 1532  PROBNP 10,756.0*   HbA1C: No results for input(s): "HGBA1C" in the last 72 hours. CBG: Recent Labs  Lab 12/31/23 2003  GLUCAP 116*   Lipid Profile: No results for input(s): "CHOL", "HDL", "LDLCALC", "TRIG", "CHOLHDL", "LDLDIRECT" in the last 72 hours. Thyroid Function Tests: No results for input(s): "TSH", "T4TOTAL", "FREET4", "T3FREE", "THYROIDAB" in the last 72 hours. Anemia Panel: No results for input(s): "VITAMINB12", "FOLATE", "FERRITIN", "TIBC", "IRON", "RETICCTPCT" in the last 72 hours. Urine analysis:    Component Value Date/Time   COLORURINE YELLOW 07/31/2023 1422   APPEARANCEUR CLEAR 07/31/2023 1422   APPEARANCEUR Clear 03/20/2019 1412   LABSPEC 1.013 07/31/2023 1422   PHURINE 6.5 07/31/2023 1422   GLUCOSEU NEGATIVE 07/31/2023 1422   HGBUR NEGATIVE 07/31/2023 1422   BILIRUBINUR NEGATIVE 07/31/2023 1422   BILIRUBINUR Negative 03/20/2019 1412   KETONESUR NEGATIVE 07/31/2023 1422   PROTEINUR NEGATIVE 07/31/2023 1422   NITRITE NEGATIVE 07/31/2023 1422   LEUKOCYTESUR NEGATIVE 07/31/2023 1422   Sepsis Labs: @LABRCNTIP (procalcitonin:4,lacticidven:4)  )No results found for this or any previous visit (from the past 240 hours).   Radiology Studies: DG CHEST PORT 1 VIEW Result Date: 01/02/2024 CLINICAL DATA:   Shortness of breath.  CHF. EXAM: PORTABLE CHEST 1 VIEW COMPARISON:  12/31/2023 FINDINGS: Right chest wall port a catheter noted with tip at the superior cavoatrial junction. Stable mild cardiac enlargement. Small bilateral pleural effusions are identified with pulmonary vascular congestion. No airspace consolidation identified. Signs of multilevel thoracolumbar vertebroplasty. IMPRESSION: Cardiomegaly, pulmonary vascular congestion and small bilateral pleural effusions. Imaging findings compatible with mild CHF. Electronically Signed   By: Kimberley Penman M.D.   On: 01/02/2024 10:13   DG Chest Port 1 View Result Date: 12/31/2023 CLINICAL DATA:  Chest pain. Shortness of breath. Tachycardia. Pancreatic cancer. EXAM: PORTABLE CHEST 1 VIEW COMPARISON:  Two-view chest x-ray 12/28/2023. FINDINGS: A right IJ Port-A-Cath is stable. The heart is enlarged. New bilateral pleural effusions are present, right greater than left. Mild edema is now present, also worse on the right. Bibasilar airspace opacities are worse on the right. IMPRESSION: 1. New bilateral pleural effusions, right greater than  left. 2. Mild edema, also worse on the right. 3. Bibasilar airspace disease is worse on the right. This likely reflects atelectasis. Infection or aspiration is not excluded. Electronically Signed   By: Audree Leas M.D.   On: 12/31/2023 20:04     Scheduled Meds:  amiodarone   200 mg Oral Daily   apixaban   5 mg Oral BID   carbidopa -levodopa   1 tablet Oral QHS   carbidopa -levodopa   3 tablet Oral BID   And   carbidopa -levodopa   2 tablet Oral BID   Chlorhexidine  Gluconate Cloth  6 each Topical Daily   clonazePAM   0.5 mg Oral QHS   furosemide   40 mg Intravenous Q12H   metoprolol  succinate  25 mg Oral Daily   midodrine   10 mg Oral TID WC   pantoprazole   40 mg Oral Daily   polyethylene glycol  17 g Oral Daily   rosuvastatin   10 mg Oral Daily   sodium chloride  flush  3 mL Intravenous Q12H   Continuous  Infusions:   LOS: 2 days    Time spent:    Deforest Fast, MD [facility]   01/02/2024, 10:59 AM

## 2024-01-02 NOTE — Plan of Care (Signed)
  Problem: Education: Goal: Ability to demonstrate management of disease process will improve Outcome: Progressing   Problem: Activity: Goal: Capacity to carry out activities will improve Outcome: Progressing   Problem: Cardiac: Goal: Ability to achieve and maintain adequate cardiopulmonary perfusion will improve Outcome: Progressing   Problem: Education: Goal: Knowledge of disease or condition will improve Outcome: Progressing

## 2024-01-02 NOTE — Consult Note (Addendum)
 Consultation Note Date: 01/02/2024   Patient Name: [name] [name]  DOB: February 27, 1941  MRN: [id number]  Age / Sex: 83 y.o., male  PCP: Arva Lathe, MD Referring Physician: Deforest Fast, MD  Reason for Consultation: Establishing goals of care  HPI/Patient Profile: 83 y.o. 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 Overall failure to thrive.   Clinical Assessment and Goals of Care: Consult received and chart review completed. Reviewed palliative notes from provider at [facility]. I discussed with RN. I met with [name] [name] along with son, Marjo Sievert, at bedside. I introduced myself from palliative care. [name] [name] is welcoming of presence and conversation. He shares his health struggles and decline in the setting of prostate and pancreatic cancer, Afib, Parkinson's. He shares about weakness and fatigue and that he is no longer independent. Acknowledged the frustration and sadness that comes with loss of independence. [name] [name] continues to share about his ongoing health decline and trying to come to terms with what the future holds. He shares about his wife who died ~3 years ago and how she had hospice care. He reflects on her end of life process and how this will be for him. He verifies his goal and priority is to optimize his quality of life.   [name] [name] is trying to sort through the details and complications of multiple health issues to know what he wishes to pursue that may provide him with quality. He does agree that pursuing further chemotherapy would not be helpful but also knows that his cancer will continue to progress. He is considering  cardioversion although thinking this could be helpful for his energy levels if it were to be successful. He talks of wanting to discharge from the hospital but acknowledging that he continues to be very ill and a lot we are still providing at the hospital. He is open to discussion and guidance to help him decide best path forward. They share that his daughter/HCPOA, [name], is en-route from [facility] today. They would like to meet tomorrow to discuss further. We agree to family meeting tomorrow noon.   We further reviewed that [name] [name] lives at [employer]. He has 5 children and son Marjo Sievert at bedside lives closest to him. He was in [employer] ~20 years.   All questions/concerns addressed. Emotional support provided.   Primary Decision Maker PATIENT    SUMMARY OF RECOMMENDATIONS   - Family meeting to further discuss goals of care and decisions tomorrow 4/28 1200 noon - He is considering his options and wishes  Code Status/Advance Care Planning: Full code - considering - will discuss further tomorrow   Symptom Management:  Per attending  Prognosis:  Prognosis poor.   Discharge Planning: To Be Determined      Primary Diagnoses: Present on Admission:  Acute on chronic  HFrEF (heart failure with reduced ejection fraction) (HCC)  Sleep apnea treated with nocturnal BiPAP  Prostate cancer metastatic to bone (HCC)  Parkinson's disease (HCC)  Pancreatic cancer (HCC)  Chronic atrial flutter (HCC)  Atrial fibrillation with rapid ventricular response (HCC)   I have reviewed the medical record, interviewed the patient and family, and examined the patient. The following aspects are pertinent.  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  (HCC)    Prostate cancer metastatic to bone (HCC)    REM sleep behavior disorder    Sleep apnea    BiPap - uses nightly   Social History   Socioeconomic History   Marital status: Widowed    Spouse name: Genette   Number of children: 5   Years of education: Not on file   Highest education level: Not on file  Occupational History   Occupation: retired    Comment: Art therapist  Tobacco Use   Smoking status: Former    Types: Cigars    Quit date: 09/07/2010    Years since quitting: 13.3   Smokeless tobacco: Never   Tobacco comments:    Former smoker 12/23/23  Vaping Use   Vaping status: Never Used  Substance and Sexual Activity   Alcohol  use: Not Currently    Alcohol /week: 4.0 standard drinks of alcohol     Types: 4 Standard drinks or equivalent per week    Comment: 2 cans beer/week and occas. wine   Drug use: Never   Sexual activity: Not Currently  Other Topics Concern   Not on file  Social History Narrative   [place]; quit smoking in 2012; ocassional alcohol ; Oncologist; wife; 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    Right handed   Two story home   Social Drivers of Health   Financial Resource Strain: Not on file  Food Insecurity: No Food Insecurity (01/01/2024)   Hunger Vital Sign    Worried About Running Out of Food in the Last Year: Never true    Ran Out of Food in the Last Year: Never true  Transportation Needs: No Transportation Needs (01/01/2024)   PRAPARE - Administrator, Civil Service (Medical): No    Lack of Transportation (Non-Medical): No  Physical Activity: Not on file  Stress: Not on file  Social Connections: Moderately Isolated (01/01/2024)   Social Connection and Isolation Panel [NHANES]    Frequency of Communication with Friends and Family: More than three times a week    Frequency of Social Gatherings with Friends and Family: More than three times a week    Attends Religious Services: Never    Database administrator or  Organizations: Yes    Attends Banker Meetings: 1 to 4 times per year    Marital Status: Widowed   Family History  Problem Relation Age of Onset   Heart disease Maternal Grandfather    Diabetes Paternal Grandfather    Breast cancer Daughter    Colon cancer Neg Hx    Esophageal cancer Neg Hx    Stomach cancer Neg Hx    Scheduled Meds:  amiodarone   200 mg Oral Daily   apixaban   5 mg Oral BID  carbidopa -levodopa   1 tablet Oral QHS   carbidopa -levodopa   3 tablet Oral BID   And   carbidopa -levodopa   2 tablet Oral BID   Chlorhexidine  Gluconate Cloth  6 each Topical Daily   clonazePAM   0.5 mg Oral QHS   furosemide   40 mg Intravenous Q12H   metoprolol  succinate  25 mg Oral Daily   midodrine   10 mg Oral TID WC   pantoprazole   40 mg Oral Daily   polyethylene glycol  17 g Oral Daily   rosuvastatin   10 mg Oral Daily   sodium chloride  flush  3 mL Intravenous Q12H   Continuous Infusions: PRN Meds:.acetaminophen  **OR** acetaminophen , bisacodyl , oxyCODONE , prochlorperazine , senna-docusate, sodium phosphate Allergies  Allergen Reactions   Influenza Vaccines Anaphylaxis    Flu shot: 1974 anaphylaxis. 2nd time swollen arm   Review of Systems  Constitutional:  Positive for activity change, appetite change and fatigue.  Neurological:  Positive for weakness.  Psychiatric/Behavioral:  Positive for sleep disturbance.     Physical Exam Vitals and nursing note reviewed.  Constitutional:      General: He is not in acute distress.    Appearance: He is ill-appearing.  Cardiovascular:     Rate and Rhythm: Tachycardia present. Rhythm irregularly irregular.  Pulmonary:     Effort: No tachypnea, accessory muscle usage or respiratory distress.  Abdominal:     Palpations: Abdomen is soft.  Neurological:     Mental Status: He is alert and oriented to person, place, and time.     Vital Signs: BP 108/87 (BP Location: Left Arm)   Pulse 98   Temp (!) 97.1 F (36.2 C) (Axillary)    Resp 18   Ht 5\' 4"  (1.626 m)   Wt 27.9 kg   SpO2 98%   BMI 10.56 kg/m  Pain Scale: 0-10   Pain Score: 0-No pain   SpO2: SpO2: 98 % O2 Device:SpO2: 98 % O2 Flow Rate: .O2 Flow Rate (L/min): 2 L/min  IO: Intake/output summary:  Intake/Output Summary (Last 24 hours) at 01/02/2024 3086 Last data filed at 01/02/2024 0500 Gross per 24 hour  Intake 240.9 ml  Output 1500 ml  Net -1259.1 ml    LBM: Last BM Date : 12/30/23 Baseline Weight: Weight: 60.3 kg Most recent weight: Weight: 27.9 kg     Palliative Assessment/Data:     Time Total: 80 min  Greater than 50%  of this time was spent counseling and coordinating care related to the above assessment and plan.  Signed by: Vila Grayer, NP Palliative Medicine Team Pager # [phone] (M-F 8a-5p) Team Phone # [phone] (Nights/Weekends)

## 2024-01-03 DIAGNOSIS — I5023 Acute on chronic systolic (congestive) heart failure: Secondary | ICD-10-CM | POA: Diagnosis not present

## 2024-01-03 DIAGNOSIS — Z66 Do not resuscitate: Secondary | ICD-10-CM

## 2024-01-03 LAB — BASIC METABOLIC PANEL WITH GFR
Anion gap: 11 (ref 5–15)
BUN: 27 mg/dL — ABNORMAL HIGH (ref 8–23)
CO2: 29 mmol/L (ref 22–32)
Calcium: 8.9 mg/dL (ref 8.9–10.3)
Chloride: 97 mmol/L — ABNORMAL LOW (ref 98–111)
Creatinine, Ser: 1.11 mg/dL (ref 0.61–1.24)
GFR, Estimated: >60 mL/min (ref >60)
Glucose, Bld: 106 mg/dL — ABNORMAL HIGH (ref 70–99)
Potassium: 3.4 mmol/L — ABNORMAL LOW (ref 3.5–5.1)
Sodium: 137 mmol/L (ref 135–145)

## 2024-01-03 LAB — CBC
HCT: 33.3 % — ABNORMAL LOW (ref 39.0–52.0)
Hemoglobin: 10.6 g/dL — ABNORMAL LOW (ref 13.0–17.0)
MCH: 30.6 pg (ref 26.0–34.0)
MCHC: 31.8 g/dL (ref 30.0–36.0)
MCV: 96.2 fL (ref 80.0–100.0)
Platelets: 383 10*3/uL (ref 150–400)
RBC: 3.46 MIL/uL — ABNORMAL LOW (ref 4.22–5.81)
RDW: 20.3 % — ABNORMAL HIGH (ref 11.5–15.5)
WBC: 12 10*3/uL — ABNORMAL HIGH (ref 4.0–10.5)
nRBC: 0 % (ref 0.0–0.2)

## 2024-01-03 MED ORDER — TORSEMIDE 20 MG PO TABS: 20.0000 mg | ORAL_TABLET | Freq: Every day | ORAL | Status: DC

## 2024-01-03 NOTE — Care Management Important Message (Signed)
 Important Message  Patient Details  Name: [name] [name] MRN: [id number] Date of Birth: 02-Oct-1940   Important Message Given:  Yes - Medicare IM     Wynonia Hedges 01/03/2024, 3:13 PM

## 2024-01-03 NOTE — Plan of Care (Signed)
  Problem: Education: Goal: Ability to demonstrate management of disease process will improve Outcome: Progressing Goal: Ability to verbalize understanding of medication therapies will improve Outcome: Progressing Goal: Individualized Educational Video(s) Outcome: Progressing   Problem: Activity: Goal: Capacity to carry out activities will improve Outcome: Progressing   Problem: Cardiac: Goal: Ability to achieve and maintain adequate cardiopulmonary perfusion will improve Outcome: Progressing   Problem: Activity: Goal: Ability to tolerate increased activity will improve Outcome: Progressing   Problem: Cardiac: Goal: Ability to achieve and maintain adequate cardiopulmonary perfusion will improve Outcome: Progressing   Problem: Health Behavior/Discharge Planning: Goal: Ability to safely manage health-related needs after discharge will improve Outcome: Progressing   Problem: Health Behavior/Discharge Planning: Goal: Ability to manage health-related needs will improve Outcome: Progressing   Problem: Clinical Measurements: Goal: Ability to maintain clinical measurements within normal limits will improve Outcome: Progressing Goal: Will remain free from infection Outcome: Progressing Goal: Diagnostic test results will improve Outcome: Progressing Goal: Respiratory complications will improve Outcome: Progressing Goal: Cardiovascular complication will be avoided Outcome: Progressing   Problem: Activity: Goal: Risk for activity intolerance will decrease Outcome: Progressing   Problem: Nutrition: Goal: Adequate nutrition will be maintained Outcome: Progressing   Problem: Coping: Goal: Level of anxiety will decrease Outcome: Progressing   Problem: Elimination: Goal: Will not experience complications related to bowel motility Outcome: Progressing Goal: Will not experience complications related to urinary retention Outcome: Progressing   Problem: Pain Managment: Goal:  General experience of comfort will improve and/or be controlled Outcome: Progressing   Problem: Safety: Goal: Ability to remain free from injury will improve Outcome: Progressing   Problem: Skin Integrity: Goal: Risk for impaired skin integrity will decrease Outcome: Progressing

## 2024-01-03 NOTE — Progress Notes (Signed)
 PROGRESS NOTE    [name] [name]  NFA:[id number] DOB: May 08, 1941 DOA: 12/31/2023 PCP: Arva Lathe, MD  82/M w metastatic prostate cancer, pancreatic cancer, lung metastasis, Parkinson's disease, chronic systolic CHF, paroxysmal A-fib, OSA on BiPAP, protein calorie malnutrition, recent hospitalization earlier this month with CHF back in the ED with worsening shortness of breath, fatigue.  Also reports 20+ pounds weight loss over the last few months -In the ER A-fib with RVR, given IV diltiazem  - Labs with BNP 1757, troponin 17, 23, creatinine 1.08, hemoglobin 10.8, WBC 13.4, Chest x-ray demonstrates new bilateral pleural effusions and pulmonary edema. -He is followed by outpatient palliative care   Subjective: - Breathing overall better, still fairly weak  Assessment and Plan:  Acute on chronic systolic CHF Pleural effusions - Last echo 4/25 with EF 35%, moderate LVH, mildly reduced RV - Diuresed with IV Lasix , 5.5 L negative, continue Toprol , appears close to euvolemic will transition to oral torsemide - GDMT limited by hypotension, Continue midodrine  - Overall prognosis is very poor in the setting of CHF, multiple advanced cancers, failure to thrive, malnutrition, Parkinson's disease etc. requested palliative care eval as inpatient - Discussed this with patient and son, recommended DNR, I think he is most appropriate for hospice but maybe in denial  Leukocytosis - Likely reactive, improving  A-fib with RVR -Improved, continue Toprol  and amiodarone  - Continue Eliquis   Parkinson's disease Dysphagia - Continue Sinemet , check SLP eval  Pancreatic cancer Known Pulmonary mets, unclear if this is related to pancreatic CA or prostate -Followed by oncology, on gemcitabine  per Dr. Maryalice Smaller - Followed by outpatient palliative care  Metastatic prostate cancer - Bone metastasis, on leuprolide   OSA - Continue nightly BiPAP  Protein calorie malnutrition, cachexia Adult  failure to thrive   DVT prophylaxis: Apixaban  Code Status: Full code Family Communication: No family at bedside today Disposition Plan: To be determined    Objective: Vitals:   01/02/24 2313 01/03/24 0430 01/03/24 0723 01/03/24 0932  BP: 101/73 90/64 104/85 (!) 89/76  Pulse: 87 84 98 92  Resp: 17 18 18    Temp: 97.8 F (36.6 C) 97.8 F (36.6 C) 97.8 F (36.6 C)   TempSrc: Axillary Axillary Oral   SpO2: 98% 98% 98%   Weight:      Height:        Intake/Output Summary (Last 24 hours) at 01/03/2024 1220 Last data filed at 01/03/2024 0900 Gross per 24 hour  Intake 120 ml  Output 750 ml  Net -630 ml   Filed Weights   12/31/23 1941 01/01/24 1532 01/02/24 0520  Weight: 60.3 kg 22 kg 27.9 kg    Examination:  Frail cachectic elderly male laying in bed, AAO x 3 HEENT: Positive JVD CVS: S1-S2, systolic murmur Lungs: Decreased breath sounds at the bases Abdomen: Soft, nontender, bowel sounds present Extremities: Trace edema  Psychiatry:  Mood & affect appropriate.     Data Reviewed:   CBC: Recent Labs  Lab 12/30/23 1304 12/31/23 2200 12/31/23 2229 01/01/24 0345 01/02/24 0216 01/03/24 0243  WBC 7.8 13.4*  --  15.5* 18.9* 12.0*  NEUTROABS 5.8 11.7*  --   --   --   --   HGB 9.1* 10.8* 12.2* 10.3* 10.4* 10.6*  HCT 28.0* 34.1* 36.0* 31.4* 32.0* 33.3*  MCV 95.6 98.8  --  98.4 94.7 96.2  PLT 172 256  --  222 302 383   Basic Metabolic Panel: Recent Labs  Lab 12/30/23 1304 12/31/23 2200 12/31/23 2229 01/01/24 0345 01/02/24 0216 01/03/24  0243  NA 139 137 138 138 137 137  K 4.0 4.6 4.7 3.5 3.5 3.4*  CL 109 107 108 102 100 97*  CO2 26 19*  --  23 25 29   GLUCOSE 140* 118* 113* 123* 117* 106*  BUN 30* 24* 31* 22 26* 27*  CREATININE 0.91 1.08 1.00 1.13 0.92 1.11  CALCIUM  9.0 9.2  --  8.9 8.8* 8.9  MG  --   --   --  1.9  --   --    GFR: Estimated Creatinine Clearance: 20.2 mL/min (by C-G formula based on SCr of 1.11 mg/dL). Liver Function Tests: Recent Labs   Lab 12/28/23 1532 12/30/23 1304 12/31/23 2200 01/01/24 0345 01/02/24 0216  AST 120* 78* 105* 70* 40  ALT 16 12 12 8 10   ALKPHOS 100 102 131* 106 90  BILITOT 0.8 1.0 1.8* 1.8* 1.6*  PROT 5.9* 6.3* 7.3 6.5 5.9*  ALBUMIN  3.6 3.6 3.5 3.2* 2.8*   No results for input(s): "LIPASE", "AMYLASE" in the last 168 hours. No results for input(s): "AMMONIA" in the last 168 hours. Coagulation Profile: No results for input(s): "INR", "PROTIME" in the last 168 hours. Cardiac Enzymes: No results for input(s): "CKTOTAL", "CKMB", "CKMBINDEX", "TROPONINI" in the last 168 hours. BNP (last 3 results) Recent Labs    12/28/23 1532  PROBNP 10,756.0*   HbA1C: No results for input(s): "HGBA1C" in the last 72 hours. CBG: Recent Labs  Lab 12/31/23 2003  GLUCAP 116*   Lipid Profile: No results for input(s): "CHOL", "HDL", "LDLCALC", "TRIG", "CHOLHDL", "LDLDIRECT" in the last 72 hours. Thyroid Function Tests: No results for input(s): "TSH", "T4TOTAL", "FREET4", "T3FREE", "THYROIDAB" in the last 72 hours. Anemia Panel: No results for input(s): "VITAMINB12", "FOLATE", "FERRITIN", "TIBC", "IRON", "RETICCTPCT" in the last 72 hours. Urine analysis:    Component Value Date/Time   COLORURINE YELLOW 01/02/2024 1704   APPEARANCEUR CLEAR 01/02/2024 1704   APPEARANCEUR Clear 03/20/2019 1412   LABSPEC 1.012 01/02/2024 1704   PHURINE 5.0 01/02/2024 1704   GLUCOSEU NEGATIVE 01/02/2024 1704   HGBUR NEGATIVE 01/02/2024 1704   BILIRUBINUR NEGATIVE 01/02/2024 1704   BILIRUBINUR Negative 03/20/2019 1412   KETONESUR 5 (A) 01/02/2024 1704   PROTEINUR NEGATIVE 01/02/2024 1704   NITRITE NEGATIVE 01/02/2024 1704   LEUKOCYTESUR NEGATIVE 01/02/2024 1704   Sepsis Labs: @LABRCNTIP (procalcitonin:4,lacticidven:4)  )No results found for this or any previous visit (from the past 240 hours).   Radiology Studies: DG CHEST PORT 1 VIEW Result Date: 01/02/2024 CLINICAL DATA:  Shortness of breath.  CHF. EXAM: PORTABLE  CHEST 1 VIEW COMPARISON:  12/31/2023 FINDINGS: Right chest wall port a catheter noted with tip at the superior cavoatrial junction. Stable mild cardiac enlargement. Small bilateral pleural effusions are identified with pulmonary vascular congestion. No airspace consolidation identified. Signs of multilevel thoracolumbar vertebroplasty. IMPRESSION: Cardiomegaly, pulmonary vascular congestion and small bilateral pleural effusions. Imaging findings compatible with mild CHF. Electronically Signed   By: Kimberley Penman M.D.   On: 01/02/2024 10:13     Scheduled Meds:  amiodarone   200 mg Oral Daily   apixaban   5 mg Oral BID   bisacodyl   10 mg Rectal Once   carbidopa -levodopa   1 tablet Oral QHS   carbidopa -levodopa   3 tablet Oral BID   And   carbidopa -levodopa   2 tablet Oral BID   clonazePAM   0.5 mg Oral QHS   metoprolol  succinate  25 mg Oral Daily   midodrine   10 mg Oral TID WC   pantoprazole   40 mg Oral Daily  polyethylene glycol  17 g Oral Daily   rosuvastatin   10 mg Oral Daily   sodium chloride  flush  3 mL Intravenous Q12H   [START ON 01/04/2024] torsemide  20 mg Oral Daily   Continuous Infusions:   LOS: 3 days    Time spent:    Deforest Fast, MD [facility]   01/03/2024, 12:20 PM

## 2024-01-03 NOTE — TOC Progression Note (Signed)
 Transition of Care [facility]) - Progression Note    Patient Details  Name: [name] [name] MRN: [id number] Date of Birth: Sep 27, 1940  Transition of Care [facility]) CM/SW Contact  Jannine Meo, RN Phone Number: 01/03/2024, 1:47 PM  Clinical Narrative:   Secure message from CSW that patient will use [facility] hospice at [facility] where he currently lives.         Expected Discharge Plan and Services                                               Social Determinants of Health (SDOH) Interventions SDOH Screenings   Food Insecurity: No Food Insecurity (01/02/2024)  Housing: Low Risk  (01/01/2024)  Transportation Needs: No Transportation Needs (01/01/2024)  Utilities: Not At Risk (01/01/2024)  Depression (PHQ2-9): Low Risk  (12/23/2020)  Recent Concern: Depression (PHQ2-9) - Medium Risk (11/26/2020)  Social Connections: Moderately Isolated (01/02/2024)  Tobacco Use: Medium Risk (12/31/2023)    Readmission Risk Interventions    08/02/2023   12:37 PM  Readmission Risk Prevention Plan  Transportation Screening Complete  PCP or Specialist Appt within 3-5 Days Complete  HRI or Home Care Consult Complete  Social Work Consult for Recovery Care Planning/Counseling Complete  Palliative Care Screening Not Applicable  Medication Review [employer]) Complete

## 2024-01-03 NOTE — Care Management Important Message (Signed)
 Important Message  Patient Details  Name: [name] [name] MRN: [id number] Date of Birth: June 10, 1941   Important Message Given:  Yes - Medicare and Tricare IM     Wynonia Hedges 01/03/2024, 3:13 PM

## 2024-01-03 NOTE — Evaluation (Signed)
 Occupational Therapy Evaluation Patient Details Name: JAKEOB ATHERTON MRN: [id number] DOB: 1940/11/07 Today's Date: 01/03/2024   History of Present Illness   Zamon November is an 82/M admitted with acute on chronic systolic CHF, pleural effusions. Pt with metastatic prostate cancer, pancreatic cancer, lung metastasis. PMH: Parkinson's disease, chronic systolic CHF, paroxysmal A-fib, OSA on BiPAP, protein calorie malnutrition, recent hospitalization earlier this month with CHF, L2 kyphoplasty, T8 compression fracture 12/24.     Clinical Impressions PTA pt living at [facility] in ILF, ambulates with a rollator independently and has assistance with weekly showers and help PRN during daytime hours from . Pt with 2 recent falls and overall decline in functional status due to generalized weakness. Plan is to DC to ALF at [facility] with Hospice services however pt wants to continue to do as much for himself as possible and very much enjoys walking. Able to ambulate @ 80 ft with rollator with VSS on RA. Acute OT will follow to help maximize functional level of independence to improve quality of life. Pt very appreciative.      If plan is discharge home, recommend the following:   A little help with walking and/or transfers;A little help with bathing/dressing/bathroom;Assistance with cooking/housework;Direct supervision/assist for medications management;Assist for transportation;Help with stairs or ramp for entrance     Functional Status Assessment   Patient has had a recent decline in their functional status and/or demonstrates limited ability to make significant improvements in function in a reasonable and predictable amount of time     Equipment Recommendations   None recommended by OT     Recommendations for Other Services         Precautions/Restrictions   Precautions Precautions: Fall Recall of Precautions/Restrictions: Intact     Mobility Bed Mobility Overal bed  mobility: Needs Assistance Bed Mobility: Supine to Sit     Supine to sit: Min assist          Transfers Overall transfer level: Needs assistance   Transfers: Sit to/from Stand Sit to Stand: Min assist                  Balance Overall balance assessment: Needs assistance, History of Falls (2 recent falls)   Sitting balance-Leahy Scale: Fair       Standing balance-Leahy Scale: Poor                             ADL either performed or assessed with clinical judgement   ADL Overall ADL's : Needs assistance/impaired Eating/Feeding: Modified independent   Grooming: Set up;Sitting   Upper Body Bathing: Set up;Sitting   Lower Body Bathing: Moderate assistance;Sit to/from stand   Upper Body Dressing : Minimal assistance;Sitting   Lower Body Dressing: Moderate assistance;Sit to/from stand   Toilet Transfer: Minimal assistance;Ambulation   Toileting- Clothing Manipulation and Hygiene: Minimal assistance       Functional mobility during ADLs: Minimal assistance;Rollator (4 wheels)       Vision Baseline Vision/History: 1 Wears glasses       Perception         Praxis         Pertinent Vitals/Pain Pain Assessment Pain Assessment: Faces Faces Pain Scale: Hurts little more Pain Location: back; R calf Pain Descriptors / Indicators: Aching, Discomfort, Grimacing, Guarding Pain Intervention(s): Limited activity within patient's tolerance     Extremity/Trunk Assessment Upper Extremity Assessment Upper Extremity Assessment: Generalized weakness   Lower Extremity Assessment Lower Extremity Assessment: Defer  to PT evaluation   Cervical / Trunk Assessment Cervical / Trunk Assessment: Normal;Other exceptions (hx of back fxs)   Communication     Cognition Arousal: Alert Behavior During Therapy: WFL for tasks assessed/performed Cognition: No apparent impairments                               Following commands: Intact        Cueing  General Comments          Exercises     Shoulder Instructions      Home Living Family/patient expects to be discharged to:: Assisted living Living Arrangements: Other (Comment) (DC to ALF with Hospice)                           Home Equipment: Rollator (4 wheels);Shower seat;Grab bars - toilet;Grab bars - tub/shower;Hand held shower head          Prior Functioning/Environment Prior Level of Function : Needs assist       Physical Assist : ADLs (physical)   ADLs (physical): IADLs;Bathing Mobility Comments: using a rollator at modified independent level ADLs Comments: Staff assisted with shower 1x/wk adn checked on him mutliple times daily    OT Problem List: Decreased strength;Decreased activity tolerance;Impaired balance (sitting and/or standing);Decreased safety awareness;Decreased knowledge of use of DME or AE;Cardiopulmonary status limiting activity;Pain   OT Treatment/Interventions: Self-care/ADL training;Therapeutic exercise;DME and/or AE instruction;Energy conservation;Therapeutic activities;Patient/family education;Balance training      OT Goals(Current goals can be found in the care plan section)   Acute Rehab OT Goals Patient Stated Goal: to do as much as he can OT Goal Formulation: With patient/family Time For Goal Achievement: 01/17/24 Potential to Achieve Goals: Good   OT Frequency:  Min 2X/week    Co-evaluation              AM-PAC OT "6 Clicks" Daily Activity     Outcome Measure Help from another person eating meals?: None Help from another person taking care of personal grooming?: A Little Help from another person toileting, which includes using toliet, bedpan, or urinal?: A Little Help from another person bathing (including washing, rinsing, drying)?: A Lot Help from another person to put on and taking off regular upper body clothing?: A Little Help from another person to put on and taking off regular lower body  clothing?: A Lot 6 Click Score: 17   End of Session Equipment Utilized During Treatment: Gait belt;Rollator (4 wheels) Nurse Communication: Mobility status;Other (comment) (R calf pain)  Activity Tolerance: Patient tolerated treatment well Patient left: in chair;with call bell/phone within reach;with family/visitor present  OT Visit Diagnosis: Unsteadiness on feet (R26.81);Other abnormalities of gait and mobility (R26.89);Muscle weakness (generalized) (M62.81);Pain Pain - Right/Left: Right Pain - part of body: Leg                Time: 1610-9604 OT Time Calculation (min): 33 min Charges:  OT General Charges $OT Visit: 1 Visit OT Evaluation $OT Eval Moderate Complexity: 1 Mod OT Treatments $Self Care/Home Management : 8-22 mins  Milburn Aliment, OT/L   Acute OT Clinical Specialist Acute Rehabilitation Services Pager [phone] Office [phone]   [facility] 01/03/2024, 3:12 PM

## 2024-01-03 NOTE — Progress Notes (Addendum)
 Palliative:  HPI: 83 y.o. 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 Overall failure to thrive.   I met today with [name] [name] along with his children [name] [name] and [name] [name] as well as Industrial/product designer via speaker phone. We had a long discussion regarding goals of care. We spoke at length about resuscitation and expectations and outcomes. We discussed that regardless of the specifics of the triggering event to indicate need for resuscitation - the prognosis would still be very poor. We also discussed the risk of prolonging suffering and causing more suffering at end of life. Ultimately, [name] [name] makes firm decision for DNR status. He would not want resuscitation efforts.   We spent time discussing other plans and goals of care. [name] [name] wishes to return to [employer] - he has been happy there. He is in independent living but is open to transition to ALF. He has private caregivers and family are looking at increasing his support at [facility] - they will speak with facility about options and recommendations. [name] [name] shares that he has no desire to pursue further chemotherapy. We opened conversation regarding hospice support and they all agree this would be beneficial. [name] [name] had an experience with another family member at end of life that was not very good. We spent time discussing the benefits of hospice for support, guidance, anticipation of needs, as well as managing symptoms to minimize suffering. [name] [name] speaks of having hospice support for his wife and this was a good experience although he talks of the difficult seeing her in her last moments and days. They all desire hospice support and symptom management to minimize suffering. [name] [name] mentions  [facility]. We did discuss [facility] as an option in the future if needed.   We spent time discussing [name] [name]'s desire for future hospitalization. We discussed the risks vs benefits and the use of hospice to manage to decrease need for hospitalization. [name] [name] is tired and making peace with poor prognosis. He does not want to suffer. He seems more inclined to focusing on maximizing his quality of life at [facility] and focusing on comfort with further decline. I encouraged him that he does not have to make final decision today but should think about his wishes and clearly communicate these with his family. He does desire to continue to optimize his functional status as much as possible to have the best quality of life as possible.   All questions/concerns addressed. Emotional support provided. Provided Hard Choices booklet to family.   Exam: Alert, oriented. Thin, frail. No distress. Breathing regular, unlabored. Abd flat. Moves all extremities.   Plan: - DNR/DNI - Hospice to follow after discharge ([facility]) - Continue to optimize medically  75 min  [name] Grayer, NP Palliative Medicine Team Pager [phone] (Please see [email or web address] for schedule) Team Phone [phone]

## 2024-01-03 NOTE — Plan of Care (Signed)
  Problem: Clinical Measurements: Goal: Respiratory complications will improve Outcome: Progressing Goal: Cardiovascular complication will be avoided Outcome: Progressing   Problem: Nutrition: Goal: Adequate nutrition will be maintained Outcome: Progressing   Problem: Pain Managment: Goal: General experience of comfort will improve and/or be controlled Outcome: Progressing

## 2024-01-03 NOTE — Progress Notes (Signed)
 Heart Failure Navigator Progress Note  Assessed for Heart & Vascular TOC clinic readiness.  Patient does not meet criteria due to - metastatic prostate cancer , No HF TOC.   Navigator will sign off at this time.   Randie Bustle, BSN, Scientist, clinical (histocompatibility and immunogenetics) Only

## 2024-01-04 ENCOUNTER — Other Ambulatory Visit: Payer: Self-pay | Admitting: Cardiology

## 2024-01-04 DIAGNOSIS — C7801 Secondary malignant neoplasm of right lung: Secondary | ICD-10-CM | POA: Diagnosis not present

## 2024-01-04 DIAGNOSIS — C250 Malignant neoplasm of head of pancreas: Secondary | ICD-10-CM | POA: Diagnosis not present

## 2024-01-04 DIAGNOSIS — I5023 Acute on chronic systolic (congestive) heart failure: Secondary | ICD-10-CM | POA: Diagnosis not present

## 2024-01-04 LAB — CBC
HCT: 33.2 % — ABNORMAL LOW (ref 39.0–52.0)
Hemoglobin: 10.6 g/dL — ABNORMAL LOW (ref 13.0–17.0)
MCH: 30.5 pg (ref 26.0–34.0)
MCHC: 31.9 g/dL (ref 30.0–36.0)
MCV: 95.4 fL (ref 80.0–100.0)
Platelets: 430 10*3/uL — ABNORMAL HIGH (ref 150–400)
RBC: 3.48 MIL/uL — ABNORMAL LOW (ref 4.22–5.81)
RDW: 20.1 % — ABNORMAL HIGH (ref 11.5–15.5)
WBC: 8.8 10*3/uL (ref 4.0–10.5)
nRBC: 0 % (ref 0.0–0.2)

## 2024-01-04 LAB — BASIC METABOLIC PANEL WITH GFR
Anion gap: 10 (ref 5–15)
BUN: 25 mg/dL — ABNORMAL HIGH (ref 8–23)
CO2: 29 mmol/L (ref 22–32)
Calcium: 8.9 mg/dL (ref 8.9–10.3)
Chloride: 98 mmol/L (ref 98–111)
Creatinine, Ser: 0.87 mg/dL (ref 0.61–1.24)
GFR, Estimated: >60 mL/min (ref >60)
Glucose, Bld: 147 mg/dL — ABNORMAL HIGH (ref 70–99)
Potassium: 3.6 mmol/L (ref 3.5–5.1)
Sodium: 137 mmol/L (ref 135–145)

## 2024-01-04 MED ORDER — APIXABAN 2.5 MG PO TABS
2.5000 mg | ORAL_TABLET | Freq: Two times a day (BID) | ORAL | Status: DC
  Administered 2024-01-04 – 2024-01-05 (×2): 2.5 mg via ORAL
  Filled 2024-01-04 (×2): qty 1

## 2024-01-04 MED ORDER — FUROSEMIDE 10 MG/ML IJ SOLN
40.0000 mg | Freq: Two times a day (BID) | INTRAMUSCULAR | Status: AC
  Administered 2024-01-04 (×2): 40 mg via INTRAVENOUS
  Filled 2024-01-04 (×2): qty 4

## 2024-01-04 NOTE — Care Management (Addendum)
 Received notification from Ardine Beckwith w [facility] that they have been contacted by email from patient's daughter Pasqual Bone that she woud like services through [facility] at [facility] after DC. Will continue to follow for level of care, Palliative vs Home Hospice and work with [facility] liaison to ensure patient needs are met.    13:30 Spoke w team, Anticipate that patient will DC tomorrow, pending delivery of home DME through [facility]. Plan is to return home to ILF with [facility] hosp[facility]   14:30 Per [facility] DME to be delivered tomorrow before noon    15:55 Spoke w daughter [name]. Confirmed plans for DC tomorrow. They will need PTAR, address confirmed.  They have care set up for him at [facility] ILF to support hospice.

## 2024-01-04 NOTE — Progress Notes (Signed)
 Palliative:  HPI: 83 y.o. 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 Overall failure to thrive.    [name] [name] is sleeping but I met today with daughter, [name] [name], outside room. [name] [name] shares that they have had further conversations and have spoken with [facility] liaison, Melissa. [name] [name] shares that her father wanted a little time to rest and think before "signing on the dotted line" for hospice although we both believe this is his desire. They will reach out to [facility] and let her know when they have final decision. [name] [name] is ready to return to his home. [name] [name] was able to speak with [facility] and they will increase care at ILF to accommodate him there. Family have had ongoing discussions. [name] [name] and I reviewed his goals and expectations. [name] [name] is very supportive of her father and his wishes and quality of life. Provided listening and support to [place].   All questions/concerns addressed. Emotional support provided.   Exam: Sleeping - I do not awaken today.   Plan: - DNR/DNI - Hospice following to assist with services after d/c  25 min  Vila Grayer, NP Palliative Medicine Team Pager [phone] (Please see [email or web address] for schedule) Team Phone [phone]

## 2024-01-04 NOTE — Progress Notes (Signed)
 [name] [name]   DOB:1940-12-24   NU#:[id number]      ASSESSMENT & PLAN:  Pancreatic cancer with mets - Diagnosed 09/26/2021. - Patient was poor surgical candidate due to age and medical comorbidities.  He is status post chemotherapy and radiation therapy. - CT scan done September 2024 showed multiple lung nodules.  PET scan was also positive. - Patient was switched to single agent gemcitabine  10/21/2023 due to increasing fatigue. - Due to poor prognosis patient and her family have decided on comfort care/hospice at this time. [facility] will follow after discharge - Palliative team evaluation and management appreciated. - Medical oncology/Dr. Maryalice Smaller has been following patient and is available to discuss with patient and family any other oncologic questions or issues.  Failure to thrive Dysphagia/malnutrition Dehydration Generalized weakness - Secondary to malignancy - Continue nutritional supplements and hydration - Continue supportive care  Anemia - Likely multifactorial due to malignancy, nutritional deficits - Hemoglobin 10.6.  No transfusion or intervention required.   Code Status DNR-limited  Subjective:  Patient seen awake very weak appearing laying in bed.  He denies pain at this time and breathing is regular.  He stated in very low voice "no more chemo".  States his daughter is arranging for hospice care.  No other acute complaints offered.  Objective:  Vitals:   01/04/24 0940 01/04/24 1106  BP: 93/74 100/84  Pulse: 85 90  Resp:  19  Temp:  98.3 F (36.8 C)  SpO2:  98%     Intake/Output Summary (Last 24 hours) at 01/04/2024 1258 Last data filed at 01/04/2024 1219 Gross per 24 hour  Intake 480 ml  Output 1600 ml  Net -1120 ml     PHYSICAL EXAMINATION: ECOG PERFORMANCE STATUS: 4 - Bedbound  Vitals:   01/04/24 0940 01/04/24 1106  BP: 93/74 100/84  Pulse: 85 90  Resp:  19  Temp:  98.3 F (36.8 C)  SpO2:  98%   Filed Weights   01/02/24 0520 01/04/24  0800  Weight: 61 lb 8.1 oz (27.9 kg) 121 lb 11.2 oz (55.2 kg)    GENERAL: alert, + ill-appearing + frail SKIN: + Pale skin color, texture, turgor are normal, no rashes or significant lesions EYES: normal, conjunctiva are pink and non-injected, sclera clear OROPHARYNX: no exudate, no erythema and lips, buccal mucosa, and tongue normal  NECK: supple, thyroid normal size, non-tender, without nodularity LYMPH: no palpable lymphadenopathy in the cervical, axillary or inguinal LUNGS: clear to auscultation and percussion with normal breathing effort HEART: regular rate & rhythm and no murmurs and no lower extremity edema ABDOMEN: abdomen soft, non-tender and normal bowel sounds MUSCULOSKELETAL: no cyanosis of digits and no clubbing  PSYCH: alert & oriented x 3 with low fluent speech NEURO: no focal motor/sensory deficits   All questions were answered. The patient knows to call the clinic with any problems, questions or concerns.   The total time spent in the appointment was 40 minutes encounter with patient including review of chart and various tests results, discussions about plan of care and coordination of care plan  Jacqualin Mate, NP 01/04/2024 12:58 PM    Labs Reviewed:  Lab Results  Component Value Date   WBC 8.8 01/04/2024   HGB 10.6 (L) 01/04/2024   HCT 33.2 (L) 01/04/2024   MCV 95.4 01/04/2024   PLT 430 (H) 01/04/2024   Recent Labs    10/26/23 1032 11/04/23 1232 12/31/23 2200 12/31/23 2229 01/01/24 0345 01/02/24 0216 01/03/24 0243 01/04/24 0228  NA 138   < >  137   < > 138 137 137 137  K 4.3   < > 4.6   < > 3.5 3.5 3.4* 3.6  CL 105   < > 107   < > 102 100 97* 98  CO2 25   < > 19*  --  23 25 29 29   GLUCOSE 93   < > 118*   < > 123* 117* 106* 147*  BUN 22   < > 24*   < > 22 26* 27* 25*  CREATININE 0.82   < > 1.08   < > 1.13 0.92 1.11 0.87  CALCIUM  9.0   < > 9.2  --  8.9 8.8* 8.9 8.9  GFRNONAA >60   < > >60  --  >60 >60 >60 >60  PROT 6.5   < > 7.3  --  6.5 5.9*  --    --   ALBUMIN  3.6   < > 3.5  --  3.2* 2.8*  --   --   AST 29   < > 105*  --  70* 40  --   --   ALT 5   < > 12  --  8 10  --   --   ALKPHOS 59   < > 131*  --  106 90  --   --   BILITOT 1.6*   < > 1.8*  --  1.8* 1.6*  --   --   BILIDIR 0.4*  --   --   --  0.5*  --   --   --   IBILI 1.2*  --   --   --  1.3*  --   --   --    < > = values in this interval not displayed.    Studies Reviewed:  DG CHEST PORT 1 VIEW Result Date: 01/02/2024 CLINICAL DATA:  Shortness of breath.  CHF. EXAM: PORTABLE CHEST 1 VIEW COMPARISON:  12/31/2023 FINDINGS: Right chest wall port a catheter noted with tip at the superior cavoatrial junction. Stable mild cardiac enlargement. Small bilateral pleural effusions are identified with pulmonary vascular congestion. No airspace consolidation identified. Signs of multilevel thoracolumbar vertebroplasty. IMPRESSION: Cardiomegaly, pulmonary vascular congestion and small bilateral pleural effusions. Imaging findings compatible with mild CHF. Electronically Signed   By: Kimberley Penman M.D.   On: 01/02/2024 10:13   DG Chest Port 1 View Result Date: 12/31/2023 CLINICAL DATA:  Chest pain. Shortness of breath. Tachycardia. Pancreatic cancer. EXAM: PORTABLE CHEST 1 VIEW COMPARISON:  Two-view chest x-ray 12/28/2023. FINDINGS: A right IJ Port-A-Cath is stable. The heart is enlarged. New bilateral pleural effusions are present, right greater than left. Mild edema is now present, also worse on the right. Bibasilar airspace opacities are worse on the right. IMPRESSION: 1. New bilateral pleural effusions, right greater than left. 2. Mild edema, also worse on the right. 3. Bibasilar airspace disease is worse on the right. This likely reflects atelectasis. Infection or aspiration is not excluded. Electronically Signed   By: Audree Leas M.D.   On: 12/31/2023 20:04   DG Chest 2 View Result Date: 12/28/2023 CLINICAL DATA:  Shortness of breath EXAM: CHEST - 2 VIEW COMPARISON:  07/31/2023  FINDINGS: Right Port-A-Cath remains in place, unchanged. Heart upper limits normal in size. Mediastinal contours within normal limits. No confluent airspace opacities or effusions. No acute bony abnormality. IMPRESSION: No active cardiopulmonary disease. Electronically Signed   By: Janeece Mechanic M.D.   On: 12/28/2023 15:00   ECHOCARDIOGRAM COMPLETE Result  Date: 12/08/2023    ECHOCARDIOGRAM REPORT   Patient Name:   BRYSYN HOWREN Date of Exam: 12/08/2023 Medical Rec #:  [id number]        Height:       64.0 in Accession #:    [id number]       Weight:       138.7 lb Date of Birth:  08-06-1941       BSA:          1.674 m Patient Age:    82 years         BP:           91/74 mmHg Patient Gender: M                HR:           125 bpm. Exam Location:  Inpatient Procedure: 2D Echo, Cardiac Doppler and Color Doppler (Both Spectral and Color            Flow Doppler were utilized during procedure). Indications:    CHF  History:        Patient has prior history of Echocardiogram examinations, most                 recent 04/30/2021. Arrythmias:Atrial Fibrillation.  Sonographer:    Amy Chionchio Referring Phys: [id number] VISHAL R PATEL IMPRESSIONS  1. Left ventricular ejection fraction, by estimation, is 35 to 40%. The left ventricle has moderately decreased function. The left ventricle demonstrates global hypokinesis. There is moderate left ventricular hypertrophy. Left ventricular diastolic parameters are indeterminate.  2. Right ventricular systolic function is mildly reduced. The right ventricular size is mildly enlarged.  3. Left atrial size was severely dilated.  4. Right atrial size was severely dilated.  5. The mitral valve is normal in structure. Mild mitral valve regurgitation.  6. The aortic valve is tricuspid. Aortic valve regurgitation is mild. No aortic stenosis is present. FINDINGS  Left Ventricle: Left ventricular ejection fraction, by estimation, is 35 to 40%. The left ventricle has moderately decreased function.  The left ventricle demonstrates global hypokinesis. The left ventricular internal cavity size was normal in size. There is moderate left ventricular hypertrophy. Left ventricular diastolic parameters are indeterminate. Right Ventricle: The right ventricular size is mildly enlarged. No increase in right ventricular wall thickness. Right ventricular systolic function is mildly reduced. Left Atrium: Left atrial size was severely dilated. Right Atrium: Right atrial size was severely dilated. Pericardium: Trivial pericardial effusion is present. Mitral Valve: The mitral valve is normal in structure. Mild mitral valve regurgitation. MV peak gradient, 2.1 mmHg. The mean mitral valve gradient is 1.0 mmHg. Tricuspid Valve: The tricuspid valve is normal in structure. Tricuspid valve regurgitation is mild. Aortic Valve: The aortic valve is tricuspid. Aortic valve regurgitation is mild. Aortic regurgitation PHT measures 751 msec. No aortic stenosis is present. Aortic valve mean gradient measures 2.0 mmHg. Aortic valve peak gradient measures 3.1 mmHg. Aortic  valve area, by VTI measures 3.36 cm. Pulmonic Valve: The pulmonic valve was not well visualized. Pulmonic valve regurgitation is not visualized. Aorta: The aortic root is normal in size and structure. IAS/Shunts: The interatrial septum was not well visualized.  LEFT VENTRICLE PLAX 2D LVIDd:         4.50 cm LVIDs:         4.30 cm LV PW:         1.30 cm LV IVS:        1.30 cm LVOT diam:  2.30 cm LV SV:         42 LV SV Index:   25 LVOT Area:     4.15 cm  LV Volumes (MOD) LV vol d, MOD A4C: 96.5 ml LV vol s, MOD A4C: 60.1 ml LV SV MOD A4C:     96.5 ml RIGHT VENTRICLE RV Basal diam:  4.00 cm RV Mid diam:    3.80 cm TAPSE (M-mode): 2.0 cm LEFT ATRIUM              Index        RIGHT ATRIUM           Index LA Vol (A2C):   70.5 ml  42.11 ml/m  RA Area:     25.40 cm LA Vol (A4C):   126.0 ml 75.25 ml/m  RA Volume:   82.10 ml  49.03 ml/m LA Biplane Vol: 97.6 ml  58.29 ml/m   AORTIC VALVE                    PULMONIC VALVE AV Area (Vmax):    3.57 cm     PV Vmax:          1.20 m/s AV Area (Vmean):   3.02 cm     PV Peak grad:     5.8 mmHg AV Area (VTI):     3.36 cm     PR End Diast Vel: 8.64 msec AV Vmax:           88.30 cm/s AV Vmean:          70.300 cm/s AV VTI:            0.125 m AV Peak Grad:      3.1 mmHg AV Mean Grad:      2.0 mmHg LVOT Vmax:         75.80 cm/s LVOT Vmean:        51.100 cm/s LVOT VTI:          0.101 m LVOT/AV VTI ratio: 0.81 AI PHT:            751 msec  AORTA Ao Root diam: 3.90 cm Ao Asc diam:  3.80 cm MITRAL VALVE              TRICUSPID VALVE MV Area (PHT): 3.67 cm   TR Peak grad:   21.0 mmHg MV Area VTI:   5.42 cm   TR Vmax:        229.00 cm/s MV Peak grad:  2.1 mmHg MV Mean grad:  1.0 mmHg   SHUNTS MV Vmax:       0.72 m/s   Systemic VTI:  0.10 m MV Vmean:      37.7 cm/s  Systemic Diam: 2.30 cm Carson Clara MD Electronically signed by Carson Clara MD Signature Date/Time: 12/08/2023/1:22:28 PM    Final    CT Angio Chest PE W and/or Wo Contrast Result Date: 12/07/2023 CLINICAL DATA:  Concern for pulmonary embolism. Abdominal pain and diarrhea. Pancreatic cancer undergoing therapy. * Tracking Code: BO * EXAM: CT ANGIOGRAPHY CHEST CT ABDOMEN AND PELVIS WITH CONTRAST TECHNIQUE: Multidetector CT imaging of the chest was performed using the standard protocol during bolus administration of intravenous contrast. Multiplanar CT image reconstructions and MIPs were obtained to evaluate the vascular anatomy. Multidetector CT imaging of the abdomen and pelvis was performed using the standard protocol during bolus administration of intravenous contrast. RADIATION DOSE REDUCTION: This exam was performed according to the departmental dose-optimization program which  includes automated exposure control, adjustment of the mA and/or kV according to patient size and/or use of iterative reconstruction technique. CONTRAST:  85mL OMNIPAQUE  IOHEXOL  350 MG/ML SOLN  COMPARISON:  10/01/2023 FINDINGS: CTA CHEST FINDINGS Cardiovascular: No filling defects within the pulmonary arteries to suggest acute pulmonary embolism. Mediastinum/Nodes: No axillary or supraclavicular adenopathy. No mediastinal or hilar adenopathy. No pericardial fluid. Esophagus normal. Port in the anterior chest wall with tip in distal SVC. Lungs/Pleura: Bilateral pleural effusions increased from prior. Interstitial thickening suggesting interstitial edema is new from prior. Nodular density in the RIGHT lower lobe measuring 13 mm (image 71/4) compared to 12 mm on prior. Nodule in the lateral aspect RIGHT middle lobe measuring 14 mm is unchanged. Musculoskeletal: Review of the MIP images confirms the above findings. CT ABDOMEN and PELVIS FINDINGS Hepatobiliary: Cyst in the superior central LEFT hepatic lobe is unchanged. No duct dilatation evident. Periportal edema is present. Gallbladder is nondistended. Common bile duct nondistended. Pancreas: Severe dilatation of the pancreatic duct in the body and tail. No change from comparison exam. Calcification in the head of the pancreas. Dilatation extends of 2 18 mm in the mid pancreas not changed from prior. Spleen: Normal spleen Adrenals/urinary tract: Solitary RIGHT kidney. Ureter and bladder normal. Stomach/Bowel: Stomach, small bowel, appendix, and cecum are normal. Moderate volume stool throughout the colon. Multiple diverticula of the descending colon and sigmoid colon without acute inflammation. Vascular/Lymphatic: Abdominal aorta is normal caliber with atherosclerotic calcification. There is no retroperitoneal or periportal lymphadenopathy. No pelvic lymphadenopathy. Reproductive: Unremarkable Other: No omental peritoneal metastasis Musculoskeletal: No aggressive osseous lesion. Stable sclerotic lesion in the inferior LEFT ischium. Post RIGHT hip arthroplasty Review of the MIP images confirms the above findings. IMPRESSION: CHEST: 1. No evidence acute  pulmonary embolism. 2. Increased bilateral pleural effusions and interstitial edema. 3. Stable pulmonary nodules in the RIGHT middle lobe and RIGHT lower lobe. PELVIS: 1. No change in severe duct dilatation of the pancreas. No biliary duct dilatation. Pancreatic mass is difficult to define CT imaging 2. No evidence of metastatic disease in the abdomen pelvis. 3. Solitary RIGHT kidney appears normal Electronically Signed   By: Deboraha Fallow M.D.   On: 12/07/2023 15:49   CT ABDOMEN PELVIS W CONTRAST Result Date: 12/07/2023 CLINICAL DATA:  Concern for pulmonary embolism. Abdominal pain and diarrhea. Pancreatic cancer undergoing therapy. * Tracking Code: BO * EXAM: CT ANGIOGRAPHY CHEST CT ABDOMEN AND PELVIS WITH CONTRAST TECHNIQUE: Multidetector CT imaging of the chest was performed using the standard protocol during bolus administration of intravenous contrast. Multiplanar CT image reconstructions and MIPs were obtained to evaluate the vascular anatomy. Multidetector CT imaging of the abdomen and pelvis was performed using the standard protocol during bolus administration of intravenous contrast. RADIATION DOSE REDUCTION: This exam was performed according to the departmental dose-optimization program which includes automated exposure control, adjustment of the mA and/or kV according to patient size and/or use of iterative reconstruction technique. CONTRAST:  85mL OMNIPAQUE  IOHEXOL  350 MG/ML SOLN COMPARISON:  10/01/2023 FINDINGS: CTA CHEST FINDINGS Cardiovascular: No filling defects within the pulmonary arteries to suggest acute pulmonary embolism. Mediastinum/Nodes: No axillary or supraclavicular adenopathy. No mediastinal or hilar adenopathy. No pericardial fluid. Esophagus normal. Port in the anterior chest wall with tip in distal SVC. Lungs/Pleura: Bilateral pleural effusions increased from prior. Interstitial thickening suggesting interstitial edema is new from prior. Nodular density in the RIGHT lower lobe  measuring 13 mm (image 71/4) compared to 12 mm on prior. Nodule in the lateral aspect RIGHT middle lobe  measuring 14 mm is unchanged. Musculoskeletal: Review of the MIP images confirms the above findings. CT ABDOMEN and PELVIS FINDINGS Hepatobiliary: Cyst in the superior central LEFT hepatic lobe is unchanged. No duct dilatation evident. Periportal edema is present. Gallbladder is nondistended. Common bile duct nondistended. Pancreas: Severe dilatation of the pancreatic duct in the body and tail. No change from comparison exam. Calcification in the head of the pancreas. Dilatation extends of 2 18 mm in the mid pancreas not changed from prior. Spleen: Normal spleen Adrenals/urinary tract: Solitary RIGHT kidney. Ureter and bladder normal. Stomach/Bowel: Stomach, small bowel, appendix, and cecum are normal. Moderate volume stool throughout the colon. Multiple diverticula of the descending colon and sigmoid colon without acute inflammation. Vascular/Lymphatic: Abdominal aorta is normal caliber with atherosclerotic calcification. There is no retroperitoneal or periportal lymphadenopathy. No pelvic lymphadenopathy. Reproductive: Unremarkable Other: No omental peritoneal metastasis Musculoskeletal: No aggressive osseous lesion. Stable sclerotic lesion in the inferior LEFT ischium. Post RIGHT hip arthroplasty Review of the MIP images confirms the above findings. IMPRESSION: CHEST: 1. No evidence acute pulmonary embolism. 2. Increased bilateral pleural effusions and interstitial edema. 3. Stable pulmonary nodules in the RIGHT middle lobe and RIGHT lower lobe. PELVIS: 1. No change in severe duct dilatation of the pancreas. No biliary duct dilatation. Pancreatic mass is difficult to define CT imaging 2. No evidence of metastatic disease in the abdomen pelvis. 3. Solitary RIGHT kidney appears normal Electronically Signed   By: Deboraha Fallow M.D.   On: 12/07/2023 15:49

## 2024-01-04 NOTE — Progress Notes (Signed)
 PROGRESS NOTE    [name] [name]  ZHY:[id number] DOB: 07-09-1941 DOA: 12/31/2023 PCP: Arva Lathe, MD  82/M w metastatic prostate cancer, pancreatic cancer, lung metastasis, Parkinson's disease, chronic systolic CHF, paroxysmal A-fib, OSA on BiPAP, protein calorie malnutrition, recent hospitalization earlier this month with CHF back in the ED with worsening shortness of breath, fatigue.  Also reports 20+ pounds weight loss over the last few months -In the ER A-fib with RVR, given IV diltiazem  - Labs with BNP 1757, troponin 17, 23, creatinine 1.08, hemoglobin 10.8, WBC 13.4, Chest x-ray demonstrates new bilateral pleural effusions and pulmonary edema. -He is followed by outpatient palliative care   Subjective: - Breathing improving, was able to sit up in the chair yesterday - Discussed with palliative care yesterday, considering home with hospice at discharge  Assessment and Plan:  Acute on chronic systolic CHF Pleural effusions - Last echo 4/25 with EF 35%, moderate LVH, mildly reduced RV - Diuresed with IV Lasix , 5.8 L negative, appears more wet today, continue Toprol  will restart IV Lasix  for 1 more day - GDMT limited by hypotension, Continue midodrine  - Overall prognosis is very poor in the setting of CHF, multiple advanced cancers, failure to thrive, malnutrition, Parkinson's disease etc. requested palliative care eval, family meeting yesterday, plan for medical optimization followed by home with hospice services  Leukocytosis - Likely reactive, improving  A-fib with RVR -Improved, continue Toprol  and amiodarone  - Continue Eliquis   Parkinson's disease Dysphagia - Continue Sinemet , SLP eval completed, mild aspiration risk noted, continue current diet  Pancreatic cancer Known Pulmonary mets, unclear if this is related to pancreatic CA or prostate -Followed by oncology, on gemcitabine  per Dr. Maryalice Smaller  Metastatic prostate cancer - Bone metastasis, on  leuprolide   OSA - Continue nightly BiPAP  Protein calorie malnutrition, cachexia Adult failure to thrive   DVT prophylaxis: Apixaban  Code Status: Full code Family Communication: No family at bedside today Disposition Plan: Back to [place] with hospice services    Objective: Vitals:   01/04/24 0710 01/04/24 0800 01/04/24 0940 01/04/24 1106  BP: 102/77 105/71 93/74 100/84  Pulse: 81 80 85 90  Resp: 19 19  12   Temp: 97.6 F (36.4 C)     TempSrc: Axillary   Oral  SpO2: 96% 98%  98%  Weight:  55.2 kg    Height:        Intake/Output Summary (Last 24 hours) at 01/04/2024 1115 Last data filed at 01/04/2024 1106 Gross per 24 hour  Intake 360 ml  Output 1200 ml  Net -840 ml   Filed Weights   01/02/24 0520 01/04/24 0800  Weight: 27.9 kg 55.2 kg    Examination:  Frail cachectic elderly male laying in bed, AAO x 3 HEENT: Positive JVD CVS: S1-S2, systolic murmur Lungs: Decreased breath sounds at the bases Abdomen: Soft, nontender, bowel sounds present Extremities: Trace edema  Psychiatry:  Mood & affect appropriate.     Data Reviewed:   CBC: Recent Labs  Lab 12/30/23 1304 12/31/23 2200 12/31/23 2229 01/01/24 0345 01/02/24 0216 01/03/24 0243 01/04/24 0228  WBC 7.8 13.4*  --  15.5* 18.9* 12.0* 8.8  NEUTROABS 5.8 11.7*  --   --   --   --   --   HGB 9.1* 10.8* 12.2* 10.3* 10.4* 10.6* 10.6*  HCT 28.0* 34.1* 36.0* 31.4* 32.0* 33.3* 33.2*  MCV 95.6 98.8  --  98.4 94.7 96.2 95.4  PLT 172 256  --  222 302 383 430*   Basic Metabolic Panel:  Recent Labs  Lab 12/31/23 2200 12/31/23 2229 01/01/24 0345 01/02/24 0216 01/03/24 0243 01/04/24 0228  NA 137 138 138 137 137 137  K 4.6 4.7 3.5 3.5 3.4* 3.6  CL 107 108 102 100 97* 98  CO2 19*  --  23 25 29 29   GLUCOSE 118* 113* 123* 117* 106* 147*  BUN 24* 31* 22 26* 27* 25*  CREATININE 1.08 1.00 1.13 0.92 1.11 0.87  CALCIUM  9.2  --  8.9 8.8* 8.9 8.9  MG  --   --  1.9  --   --   --    GFR: Estimated  Creatinine Clearance: 51.1 mL/min (by C-G formula based on SCr of 0.87 mg/dL). Liver Function Tests: Recent Labs  Lab 12/28/23 1532 12/30/23 1304 12/31/23 2200 01/01/24 0345 01/02/24 0216  AST 120* 78* 105* 70* 40  ALT 16 12 12 8 10   ALKPHOS 100 102 131* 106 90  BILITOT 0.8 1.0 1.8* 1.8* 1.6*  PROT 5.9* 6.3* 7.3 6.5 5.9*  ALBUMIN  3.6 3.6 3.5 3.2* 2.8*   No results for input(s): "LIPASE", "AMYLASE" in the last 168 hours. No results for input(s): "AMMONIA" in the last 168 hours. Coagulation Profile: No results for input(s): "INR", "PROTIME" in the last 168 hours. Cardiac Enzymes: No results for input(s): "CKTOTAL", "CKMB", "CKMBINDEX", "TROPONINI" in the last 168 hours. BNP (last 3 results) Recent Labs    12/28/23 1532  PROBNP 10,756.0*   HbA1C: No results for input(s): "HGBA1C" in the last 72 hours. CBG: Recent Labs  Lab 12/31/23 2003  GLUCAP 116*   Lipid Profile: No results for input(s): "CHOL", "HDL", "LDLCALC", "TRIG", "CHOLHDL", "LDLDIRECT" in the last 72 hours. Thyroid Function Tests: No results for input(s): "TSH", "T4TOTAL", "FREET4", "T3FREE", "THYROIDAB" in the last 72 hours. Anemia Panel: No results for input(s): "VITAMINB12", "FOLATE", "FERRITIN", "TIBC", "IRON", "RETICCTPCT" in the last 72 hours. Urine analysis:    Component Value Date/Time   COLORURINE YELLOW 01/02/2024 1704   APPEARANCEUR CLEAR 01/02/2024 1704   APPEARANCEUR Clear 03/20/2019 1412   LABSPEC 1.012 01/02/2024 1704   PHURINE 5.0 01/02/2024 1704   GLUCOSEU NEGATIVE 01/02/2024 1704   HGBUR NEGATIVE 01/02/2024 1704   BILIRUBINUR NEGATIVE 01/02/2024 1704   BILIRUBINUR Negative 03/20/2019 1412   KETONESUR 5 (A) 01/02/2024 1704   PROTEINUR NEGATIVE 01/02/2024 1704   NITRITE NEGATIVE 01/02/2024 1704   LEUKOCYTESUR NEGATIVE 01/02/2024 1704   Sepsis Labs: @LABRCNTIP (procalcitonin:4,lacticidven:4)  )No results found for this or any previous visit (from the past 240 hours).   Radiology  Studies: No results found.    Scheduled Meds:  amiodarone   200 mg Oral Daily   apixaban   5 mg Oral BID   bisacodyl   10 mg Rectal Once   carbidopa -levodopa   1 tablet Oral QHS   carbidopa -levodopa   3 tablet Oral BID   And   carbidopa -levodopa   2 tablet Oral BID   clonazePAM   0.5 mg Oral QHS   furosemide   40 mg Intravenous BID   metoprolol  succinate  25 mg Oral Daily   midodrine   10 mg Oral TID WC   pantoprazole   40 mg Oral Daily   polyethylene glycol  17 g Oral Daily   rosuvastatin   10 mg Oral Daily   sodium chloride  flush  3 mL Intravenous Q12H   Continuous Infusions:   LOS: 4 days    Time spent:    Deforest Fast, MD [facility]   01/04/2024, 11:15 AM

## 2024-01-04 NOTE — Progress Notes (Signed)
 Speech Language Pathology Treatment: Dysphagia  Patient Details Name: [name] [name] MRN: [id number] DOB: May 18, 1941 Today's Date: 01/04/2024 Time: 4098-1191 SLP Time Calculation (min) (ACUTE ONLY): 13 min  Assessment / Plan / Recommendation Clinical Impression  Pt was eating his lunch meal upon SLP arrival and believes that his swallowing is improved compared to initial eval on Sunday. He reports that his breathing is better, and he thinks this is why his swallowing is better. Education was offered about the relationship between the two. Strategies were reviewed and he acknowledges that he uses them well during meals. He did not need any additional cueing to implement them with his lunch with only one delayed throat clear observed. Note that his current diet is for regular textures but that pt had preferred mechanical soft. Discussed options again with him and he voices some concern about being able to cut his food if his daughter is not present. He is aware that there will be some restrictions to what he can order if we switch to mechanical soft, but wants to give it a try. Order update this afternoon by SLP per pr preference. Will try to f/u x1 to ensure this is a better fit.   HPI HPI: Pt is an 83 y.o. male who presented 4/25 with worsening shortness of breath, leg swelling, and fatigue. CXR 4/27: mild CHF. PMH: Parkinson disease, atrial fibrillation on Eliquis , chronic HFrEF, prostate cancer metastatic to bone, pancreatic cancer on active treatment, OSA managed with BiPAP, and admission earlier in April for acute on chronic HFrEF. 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 MBS 11/10/23: moderate pharyngeal dysphagia with sensorimotor deficits characterized by impaired tongue base retraction, pharyngeal motility and laryngeal elevation/closure resulting in retention, laryngeal penetration, and trace silent aspiration of thin liquids with sequential  swallows. A regular/dysphagia 3 diet with thin liquids was recommended with observance of the following strategies: Small bites/sips; Slow rate; Multiple dry swallows after each bite/sip; Follow solids with liquids.      SLP Plan  Continue with current plan of care      Recommendations for follow up therapy are one component of a multi-disciplinary discharge planning process, led by the attending physician.  Recommendations may be updated based on patient status, additional functional criteria and insurance authorization.    Recommendations  Diet recommendations: Dysphagia 3 (mechanical soft);Thin liquid Liquids provided via: Cup;Straw Medication Administration: Whole meds with puree Supervision: Patient able to self feed;Intermittent supervision to cue for compensatory strategies Compensations: Slow rate;Small sips/bites;Follow solids with liquid;Multiple dry swallows after each bite/sip Postural Changes and/or Swallow Maneuvers: Seated upright 90 degrees                  Oral care BID     Dysphagia, oropharyngeal phase (R13.12)     Continue with current plan of care     Beth Brooke., M.A. CCC-SLP Acute Rehabilitation Services Office: [phone]  Secure chat preferred   01/04/2024, 3:13 PM

## 2024-01-04 NOTE — Evaluation (Signed)
 Physical Therapy Evaluation Patient Details Name: DRILON USSERY MRN: [id number] DOB: 1940/10/28 Today's Date: 01/04/2024  History of Present Illness  Mackay Teufel is an 82/M admitted with acute on chronic systolic CHF, pleural effusions. Pt with metastatic prostate cancer, pancreatic cancer, lung metastasis. PMH: Parkinson's disease, chronic systolic CHF, paroxysmal A-fib, OSA on BiPAP, protein calorie malnutrition, recent hospitalization earlier this month with CHF, L2 kyphoplasty, T8 compression fracture 12/24.  Clinical Impression   Pt admitted secondary to problem above with deficits below. PTA patient was living in ILF at [facility]. He was modified independent with rollator and assist with BADLs. He reports he will either return to ILF with additional aid services vs return to ALF level of care. Patient very motivated to maintain his ability to walk (which he enjoys) and can benefit from acute PT while hospitalized. Pt currently requires min assist to ambulate with RW (assist to maneuver RW) and will likely do better with rollator (easier to maneuver). Anticipate patient will benefit from PT to address problems listed below.Will continue to follow acutely to maximize functional mobility independence and safety. Appears will discharge with hospice services and no follow-up PT.          If plan is discharge home, recommend the following: A little help with walking and/or transfers;A little help with bathing/dressing/bathroom   Can travel by private vehicle        Equipment Recommendations None recommended by PT  Recommendations for Other Services       Functional Status Assessment Patient has had a recent decline in their functional status and demonstrates the ability to make significant improvements in function in a reasonable and predictable amount of time.     Precautions / Restrictions Precautions Precautions: Fall Recall of Precautions/Restrictions:  Intact Restrictions Weight Bearing Restrictions Per Provider Order: No      Mobility  Bed Mobility               General bed mobility comments: up in chair    Transfers Overall transfer level: Needs assistance Equipment used: Rolling walker (2 wheels) Transfers: Sit to/from Stand Sit to Stand: Contact guard assist           General transfer comment: no cues needed    Ambulation/Gait Ambulation/Gait assistance: Min assist Gait Distance (Feet): 80 Feet Assistive device: Rolling walker (2 wheels) Gait Pattern/deviations: Decreased stride length, Shuffle   Gait velocity interpretation: <1.31 ft/sec, indicative of household ambulator   General Gait Details: assist with turning RW as this was heavy/difficult for pt to do with RW; no imbalance throughout  Stairs            Wheelchair Mobility     Tilt Bed    Modified Rankin (Stroke Patients Only)       Balance Overall balance assessment: Needs assistance, History of Falls (2 recent falls)   Sitting balance-Leahy Scale: Fair       Standing balance-Leahy Scale: Poor                               Pertinent Vitals/Pain Pain Assessment Pain Assessment: Faces Faces Pain Scale: Hurts a little bit Pain Location: back; R calf Pain Descriptors / Indicators: Aching, Discomfort, Cramping Pain Intervention(s): Limited activity within patient's tolerance    Home Living Family/patient expects to be discharged to:: Assisted living (vs ILF with "extra help from aide") Living Arrangements: Other (Comment) (DC to ALF with Hospice)  Home Equipment: Rollator (4 wheels);Shower seat;Grab bars - toilet;Grab bars - tub/shower;Hand held shower head Additional Comments: resides at [facility] ILF    Prior Function Prior Level of Function : Needs assist       Physical Assist : ADLs (physical)   ADLs (physical): IADLs;Bathing Mobility Comments: using a rollator at modified  independent level ADLs Comments: Staff assisted with shower 1x/wk adn checked on him mutliple times daily     Extremity/Trunk Assessment   Upper Extremity Assessment Upper Extremity Assessment: Defer to OT evaluation    Lower Extremity Assessment Lower Extremity Assessment: Generalized weakness    Cervical / Trunk Assessment Cervical / Trunk Assessment: Normal;Other exceptions (hx of back fxs)  Communication   Communication Communication: No apparent difficulties    Cognition Arousal: Alert Behavior During Therapy: WFL for tasks assessed/performed                             Following commands: Intact       Cueing Cueing Techniques: Verbal cues     General Comments General comments (skin integrity, edema, etc.): on RA with sats 92%    Exercises     Assessment/Plan    PT Assessment Patient needs continued PT services  PT Problem List Decreased strength;Decreased activity tolerance;Decreased balance;Decreased mobility;Decreased knowledge of use of DME;Cardiopulmonary status limiting activity;Pain       PT Treatment Interventions DME instruction;Gait training;Functional mobility training;Therapeutic activities;Therapeutic exercise;Balance training;Patient/family education    PT Goals (Current goals can be found in the Care Plan section)  Acute Rehab PT Goals Patient Stated Goal: conitnue to walk as much and as often as possible PT Goal Formulation: With patient Time For Goal Achievement: 01/18/24 Potential to Achieve Goals: Fair    Frequency Min 2X/week     Co-evaluation               AM-PAC PT "6 Clicks" Mobility  Outcome Measure Help needed turning from your back to your side while in a flat bed without using bedrails?: A Little Help needed moving from lying on your back to sitting on the side of a flat bed without using bedrails?: A Little Help needed moving to and from a bed to a chair (including a wheelchair)?: A Little Help needed  standing up from a chair using your arms (e.g., wheelchair or bedside chair)?: A Little Help needed to walk in hospital room?: A Little Help needed climbing 3-5 steps with a railing? : A Lot 6 Click Score: 17    End of Session Equipment Utilized During Treatment: Gait belt Activity Tolerance: Patient limited by fatigue Patient left: in chair;with call bell/phone within reach;with nursing/sitter in room;with family/visitor present Nurse Communication: Mobility status PT Visit Diagnosis: Other abnormalities of gait and mobility (R26.89)    Time: 4782-9562 PT Time Calculation (min) (ACUTE ONLY): 20 min   Charges:   PT Evaluation $PT Eval Low Complexity: 1 Low   PT General Charges $$ ACUTE PT VISIT: 1 Visit          Gayle Kava, PT Acute Rehabilitation Services  Office [phone]   Guilford Leep 01/04/2024, 9:52 AM

## 2024-01-05 DIAGNOSIS — I5023 Acute on chronic systolic (congestive) heart failure: Secondary | ICD-10-CM | POA: Diagnosis not present

## 2024-01-05 LAB — BASIC METABOLIC PANEL WITH GFR
Anion gap: 13 (ref 5–15)
BUN: 30 mg/dL — ABNORMAL HIGH (ref 8–23)
CO2: 28 mmol/L (ref 22–32)
Calcium: 9.1 mg/dL (ref 8.9–10.3)
Chloride: 96 mmol/L — ABNORMAL LOW (ref 98–111)
Creatinine, Ser: 1.1 mg/dL (ref 0.61–1.24)
GFR, Estimated: >60 mL/min (ref >60)
Glucose, Bld: 98 mg/dL (ref 70–99)
Potassium: 3.9 mmol/L (ref 3.5–5.1)
Sodium: 137 mmol/L (ref 135–145)

## 2024-01-05 MED ORDER — AMIODARONE HCL 200 MG PO TABS: ORAL_TABLET | ORAL | 0 refills | Status: DC

## 2024-01-05 MED ORDER — POTASSIUM CHLORIDE CRYS ER 20 MEQ PO TBCR: 20.0000 meq | EXTENDED_RELEASE_TABLET | Freq: Every day | ORAL | 0 refills | Status: DC

## 2024-01-05 MED ORDER — TORSEMIDE 20 MG PO TABS
20.0000 mg | ORAL_TABLET | Freq: Every day | ORAL | Status: DC
  Administered 2024-01-05: 20 mg via ORAL
  Filled 2024-01-05: qty 1

## 2024-01-05 MED ORDER — APIXABAN 2.5 MG PO TABS: 2.5000 mg | ORAL_TABLET | Freq: Two times a day (BID) | ORAL | 0 refills | Status: DC

## 2024-01-05 MED ORDER — TORSEMIDE 20 MG PO TABS: 20.0000 mg | ORAL_TABLET | Freq: Every day | ORAL | 1 refills | Status: DC

## 2024-01-05 NOTE — Progress Notes (Signed)
 Spoke to daughter, [name] [name], she stated that she would meet PTAR at [facility] but that his hospital bed had not been delivered and would be delivered between 10am-12pm. Daughter will call when equipment has been delivered.

## 2024-01-05 NOTE — Progress Notes (Signed)
 [facility] 2C12 [facility] Liaison Note  Received request from [facility] for hospice services at [facility] after discharge. Spoke with patient's daughter, [name] to initiate education related to hospice philosophy, services and team approach to care. [name] verbalized understanding of information given. Per discussion, the plan is for discharge on 4.30.   DME needs discussed. Patient has the following equipment in the home: walker, shower chair, 3n1. Family requests the following equipment for delivery:  hospital bed, BSC, wheelchair, overbed table.  Please send signed and completed DNR home with patient/family. Please provide prescriptions at discharge as needed to ensure ongoing symptom management.  [facility] information and contact numbers given to [place]. Please call with any concerns.  Thank you for the opportunity to participate in this patient's care.   Ardine Beckwith, LPN [facility] Liaison [phone]

## 2024-01-05 NOTE — Discharge Summary (Signed)
 Physician Discharge Summary  [name] [name] ZOX:[id number] DOB: 1941/05/04 DOA: 12/31/2023  PCP: Arva Lathe, MD  Admit date: 12/31/2023 Discharge date: 01/05/2024  Time spent: 45 minutes  Recommendations for Outpatient Follow-up:  Home with hospice services   Discharge Diagnoses:  Principal Problem:   Acute on chronic HFrEF  Pleural effusions Parkinson's disease Dysphagia   Atrial fibrillation with rapid ventricular response (HCC)   Chronic atrial flutter (HCC)   Parkinson's disease (HCC)   Prostate cancer metastatic to bone [facility])   Pancreatic cancer (HCC)   Sleep apnea treated with nocturnal BiPAP   Discharge Condition: Fair, guarded prognosis  Diet recommendation: Low-sodium  Filed Weights   01/04/24 0800 01/05/24 0333  Weight: 55.2 kg 54.2 kg    History of present illness:  83/M w metastatic prostate cancer, pancreatic cancer, lung metastasis, Parkinson's disease, chronic systolic CHF, paroxysmal A-fib, OSA on BiPAP, protein calorie malnutrition, recent hospitalization earlier this month with CHF back in the ED with worsening shortness of breath, fatigue.  Also reports 20+ pounds weight loss over the last few months -In the ER A-fib with RVR, given IV diltiazem  - Labs with BNP 1757, troponin 17, 23, creatinine 1.08, hemoglobin 10.8, WBC 13.4, Chest x-ray demonstrates new bilateral pleural effusions and pulmonary edema.  Hospital Course:   Acute on chronic systolic CHF Pleural effusions - Last echo 4/25 with EF 35%, moderate LVH, mildly reduced RV - Diuresed with IV Lasix , 8.1 L negative, volume status improved, continue Toprol , transitioned to oral torsemide - GDMT limited by hypotension, Continue midodrine  - Overall prognosis is very poor in the setting of CHF, multiple advanced cancers, failure to thrive, malnutrition, Parkinson's disease etc. requested palliative care eval, family meeting completed, plan for medical optimization followed by home with  hospice services   Leukocytosis - Likely reactive, improving   A-fib with RVR -Improved, continue Toprol  and amiodarone  - Continue Eliquis    Parkinson's disease Dysphagia - Continue Sinemet , SLP eval completed, mild aspiration risk noted, continue current diet   Pancreatic cancer Known Pulmonary mets, unclear if this is related to pancreatic CA or prostate -Followed by oncology, on gemcitabine  per Dr. Maryalice Smaller -Patient has decided not to pursue further chemotherapy, plan for home with hospice   Metastatic prostate cancer - Bone metastasis, on leuprolide    OSA - Continue nightly BiPAP   Protein calorie malnutrition, cachexia Adult failure to thrive  Discharge Exam: Vitals:   01/05/24 0709 01/05/24 0830  BP: 102/80 (!) 128/90  Pulse: 90 79  Resp: 14   Temp: 97.7 F (36.5 C)   SpO2: 98%    Frail cachectic elderly male laying in bed, AAO x 3 HEENT: no JVD CVS: S1-S2, systolic murmur Lungs: Decreased breath sounds at the bases Abdomen: Soft, nontender, bowel sounds present Extremities: Trace edema  Psychiatry:  Mood & affect appropriate.     Discharge Instructions    Allergies as of 01/05/2024       Reactions   Influenza Vaccines Anaphylaxis   Flu shot: 1974 anaphylaxis. 2nd time swollen arm        Medication List     STOP taking these medications    cyanocobalamin  1000 MCG tablet Commonly known as: VITAMIN B12   furosemide  20 MG tablet Commonly known as: LASIX    leuprolide  (6 Month) 45 MG injection Commonly known as: ELIGARD    zoledronic  acid 5 MG/100ML Soln injection Commonly known as: RECLAST        TAKE these medications    acetaminophen  500 MG tablet Commonly known as:  TYLENOL  Take 1 tablet (500 mg total) by mouth every 6 (six) hours as needed for moderate pain or mild pain. What changed: when to take this   amiodarone  200 MG tablet Commonly known as: PACERONE  200 mg daily What changed: additional instructions   ammonium lactate 12  % lotion Commonly known as: LAC-HYDRIN Apply 1 Application topically as needed.   apixaban  2.5 MG Tabs tablet Commonly known as: Eliquis  Take 1 tablet (2.5 mg total) by mouth 2 (two) times daily. What changed:  medication strength how much to take   bisacodyl  10 MG suppository Commonly known as: DULCOLAX Place 1 suppository (10 mg total) rectally daily as needed for moderate constipation.   carbidopa -levodopa  50-200 MG tablet Commonly known as: SINEMET  CR TAKE 1 TABLET AT BEDTIME What changed: additional instructions   carbidopa -levodopa  25-100 MG tablet Commonly known as: SINEMET  IR Take 3 tablets at 7 AM, 2 tablets at 11 AM, 3 tablets at 3 PM, 2 tablets at 7 PM What changed: Another medication with the same name was changed. Make sure you understand how and when to take each.   ciclopirox 8 % solution Commonly known as: PENLAC Apply 1 Application topically once a week.   clonazePAM  0.5 MG tablet Commonly known as: KLONOPIN  Take 1 tablet (0.5 mg total) by mouth at bedtime.   lidocaine -prilocaine  cream Commonly known as: EMLA  Apply to port site 1 to 2 hours prior to appointments/treatment   metoprolol  succinate 25 MG 24 hr tablet Commonly known as: TOPROL -XL Take 1 tablet (25 mg total) by mouth daily.   midodrine  5 MG tablet Commonly known as: PROAMATINE  Take 1 tablet (5 mg total) by mouth 3 (three) times daily with meals.   MiraLax  17 GM/SCOOP powder Generic drug: polyethylene glycol powder Take 17 g by mouth in the morning, at noon, and at bedtime.   omeprazole  40 MG capsule Commonly known as: PRILOSEC Take 40 mg by mouth daily.   ondansetron  8 MG tablet Commonly known as: ZOFRAN  Take 1 tablet (8 mg total) by mouth every 8 (eight) hours as needed for nausea or vomiting.   oxyCODONE  5 MG immediate release tablet Commonly known as: Oxy IR/ROXICODONE  Take 1 tablet (5 mg total) by mouth every 4 (four) hours as needed for severe pain (pain score 7-10).    Pancreaze  37000-97300 units Cpep Generic drug: Pancrelipase  (Lip-Prot-Amyl) TAKE 2 CAPSULES WITH BREAKFAST, LUNCH, AND EVENING MEAL (WITH EACH MEAL) AND 1 CAPSULE WITH SNACK   potassium chloride SA 20 MEQ tablet Commonly known as: KLOR-CON M Take 1 tablet (20 mEq total) by mouth daily.   prochlorperazine  10 MG tablet Commonly known as: COMPAZINE  Take 1 tablet (10 mg total) by mouth every 6 (six) hours as needed for nausea or vomiting.   rosuvastatin  10 MG tablet Commonly known as: CRESTOR  TAKE 1 TABLET DAILY   tiZANidine  2 MG tablet Commonly known as: ZANAFLEX  Take 1 tablet (2 mg total) by mouth every 8 (eight) hours as needed for muscle spasms.   torsemide 20 MG tablet Commonly known as: DEMADEX Take 1 tablet (20 mg total) by mouth daily.   triamcinolone  cream 0.1 % Commonly known as: KENALOG  Apply 1 application topically 2 (two) times daily. What changed:  when to take this reasons to take this   VISINE DRY EYE OP Place 1 drop into both eyes 2 (two) times daily as needed (eye irritation).       Allergies  Allergen Reactions   Influenza Vaccines Anaphylaxis    Flu shot: 1974  anaphylaxis. 2nd time swollen arm      The results of significant diagnostics from this hospitalization (including imaging, microbiology, ancillary and laboratory) are listed below for reference.    Significant Diagnostic Studies: DG CHEST PORT 1 VIEW Result Date: 01/02/2024 CLINICAL DATA:  Shortness of breath.  CHF. EXAM: PORTABLE CHEST 1 VIEW COMPARISON:  12/31/2023 FINDINGS: Right chest wall port a catheter noted with tip at the superior cavoatrial junction. Stable mild cardiac enlargement. Small bilateral pleural effusions are identified with pulmonary vascular congestion. No airspace consolidation identified. Signs of multilevel thoracolumbar vertebroplasty. IMPRESSION: Cardiomegaly, pulmonary vascular congestion and small bilateral pleural effusions. Imaging findings compatible with mild  CHF. Electronically Signed   By: Kimberley Penman M.D.   On: 01/02/2024 10:13   DG Chest Port 1 View Result Date: 12/31/2023 CLINICAL DATA:  Chest pain. Shortness of breath. Tachycardia. Pancreatic cancer. EXAM: PORTABLE CHEST 1 VIEW COMPARISON:  Two-view chest x-ray 12/28/2023. FINDINGS: A right IJ Port-A-Cath is stable. The heart is enlarged. New bilateral pleural effusions are present, right greater than left. Mild edema is now present, also worse on the right. Bibasilar airspace opacities are worse on the right. IMPRESSION: 1. New bilateral pleural effusions, right greater than left. 2. Mild edema, also worse on the right. 3. Bibasilar airspace disease is worse on the right. This likely reflects atelectasis. Infection or aspiration is not excluded. Electronically Signed   By: Audree Leas M.D.   On: 12/31/2023 20:04   DG Chest 2 View Result Date: 12/28/2023 CLINICAL DATA:  Shortness of breath EXAM: CHEST - 2 VIEW COMPARISON:  07/31/2023 FINDINGS: Right Port-A-Cath remains in place, unchanged. Heart upper limits normal in size. Mediastinal contours within normal limits. No confluent airspace opacities or effusions. No acute bony abnormality. IMPRESSION: No active cardiopulmonary disease. Electronically Signed   By: Janeece Mechanic M.D.   On: 12/28/2023 15:00   ECHOCARDIOGRAM COMPLETE Result Date: 12/08/2023    ECHOCARDIOGRAM REPORT   Patient Name:   MATHAN DESTIN Date of Exam: 12/08/2023 Medical Rec #:  [id number]        Height:       64.0 in Accession #:    [id number]       Weight:       138.7 lb Date of Birth:  1940-12-17       BSA:          1.674 m Patient Age:    82 years         BP:           91/74 mmHg Patient Gender: M                HR:           125 bpm. Exam Location:  Inpatient Procedure: 2D Echo, Cardiac Doppler and Color Doppler (Both Spectral and Color            Flow Doppler were utilized during procedure). Indications:    CHF  History:        Patient has prior history of Echocardiogram  examinations, most                 recent 04/30/2021. Arrythmias:Atrial Fibrillation.  Sonographer:    Amy Chionchio Referring Phys: [id number] VISHAL R PATEL IMPRESSIONS  1. Left ventricular ejection fraction, by estimation, is 35 to 40%. The left ventricle has moderately decreased function. The left ventricle demonstrates global hypokinesis. There is moderate left ventricular hypertrophy. Left ventricular diastolic parameters are indeterminate.  2. Right ventricular  systolic function is mildly reduced. The right ventricular size is mildly enlarged.  3. Left atrial size was severely dilated.  4. Right atrial size was severely dilated.  5. The mitral valve is normal in structure. Mild mitral valve regurgitation.  6. The aortic valve is tricuspid. Aortic valve regurgitation is mild. No aortic stenosis is present. FINDINGS  Left Ventricle: Left ventricular ejection fraction, by estimation, is 35 to 40%. The left ventricle has moderately decreased function. The left ventricle demonstrates global hypokinesis. The left ventricular internal cavity size was normal in size. There is moderate left ventricular hypertrophy. Left ventricular diastolic parameters are indeterminate. Right Ventricle: The right ventricular size is mildly enlarged. No increase in right ventricular wall thickness. Right ventricular systolic function is mildly reduced. Left Atrium: Left atrial size was severely dilated. Right Atrium: Right atrial size was severely dilated. Pericardium: Trivial pericardial effusion is present. Mitral Valve: The mitral valve is normal in structure. Mild mitral valve regurgitation. MV peak gradient, 2.1 mmHg. The mean mitral valve gradient is 1.0 mmHg. Tricuspid Valve: The tricuspid valve is normal in structure. Tricuspid valve regurgitation is mild. Aortic Valve: The aortic valve is tricuspid. Aortic valve regurgitation is mild. Aortic regurgitation PHT measures 751 msec. No aortic stenosis is present. Aortic valve mean  gradient measures 2.0 mmHg. Aortic valve peak gradient measures 3.1 mmHg. Aortic  valve area, by VTI measures 3.36 cm. Pulmonic Valve: The pulmonic valve was not well visualized. Pulmonic valve regurgitation is not visualized. Aorta: The aortic root is normal in size and structure. IAS/Shunts: The interatrial septum was not well visualized.  LEFT VENTRICLE PLAX 2D LVIDd:         4.50 cm LVIDs:         4.30 cm LV PW:         1.30 cm LV IVS:        1.30 cm LVOT diam:     2.30 cm LV SV:         42 LV SV Index:   25 LVOT Area:     4.15 cm  LV Volumes (MOD) LV vol d, MOD A4C: 96.5 ml LV vol s, MOD A4C: 60.1 ml LV SV MOD A4C:     96.5 ml RIGHT VENTRICLE RV Basal diam:  4.00 cm RV Mid diam:    3.80 cm TAPSE (M-mode): 2.0 cm LEFT ATRIUM              Index        RIGHT ATRIUM           Index LA Vol (A2C):   70.5 ml  42.11 ml/m  RA Area:     25.40 cm LA Vol (A4C):   126.0 ml 75.25 ml/m  RA Volume:   82.10 ml  49.03 ml/m LA Biplane Vol: 97.6 ml  58.29 ml/m  AORTIC VALVE                    PULMONIC VALVE AV Area (Vmax):    3.57 cm     PV Vmax:          1.20 m/s AV Area (Vmean):   3.02 cm     PV Peak grad:     5.8 mmHg AV Area (VTI):     3.36 cm     PR End Diast Vel: 8.64 msec AV Vmax:           88.30 cm/s AV Vmean:          70.300 cm/s  AV VTI:            0.125 m AV Peak Grad:      3.1 mmHg AV Mean Grad:      2.0 mmHg LVOT Vmax:         75.80 cm/s LVOT Vmean:        51.100 cm/s LVOT VTI:          0.101 m LVOT/AV VTI ratio: 0.81 AI PHT:            751 msec  AORTA Ao Root diam: 3.90 cm Ao Asc diam:  3.80 cm MITRAL VALVE              TRICUSPID VALVE MV Area (PHT): 3.67 cm   TR Peak grad:   21.0 mmHg MV Area VTI:   5.42 cm   TR Vmax:        229.00 cm/s MV Peak grad:  2.1 mmHg MV Mean grad:  1.0 mmHg   SHUNTS MV Vmax:       0.72 m/s   Systemic VTI:  0.10 m MV Vmean:      37.7 cm/s  Systemic Diam: 2.30 cm Carson Clara MD Electronically signed by Carson Clara MD Signature Date/Time: 12/08/2023/1:22:28 PM     Final    CT Angio Chest PE W and/or Wo Contrast Result Date: 12/07/2023 CLINICAL DATA:  Concern for pulmonary embolism. Abdominal pain and diarrhea. Pancreatic cancer undergoing therapy. * Tracking Code: BO * EXAM: CT ANGIOGRAPHY CHEST CT ABDOMEN AND PELVIS WITH CONTRAST TECHNIQUE: Multidetector CT imaging of the chest was performed using the standard protocol during bolus administration of intravenous contrast. Multiplanar CT image reconstructions and MIPs were obtained to evaluate the vascular anatomy. Multidetector CT imaging of the abdomen and pelvis was performed using the standard protocol during bolus administration of intravenous contrast. RADIATION DOSE REDUCTION: This exam was performed according to the departmental dose-optimization program which includes automated exposure control, adjustment of the mA and/or kV according to patient size and/or use of iterative reconstruction technique. CONTRAST:  85mL OMNIPAQUE  IOHEXOL  350 MG/ML SOLN COMPARISON:  10/01/2023 FINDINGS: CTA CHEST FINDINGS Cardiovascular: No filling defects within the pulmonary arteries to suggest acute pulmonary embolism. Mediastinum/Nodes: No axillary or supraclavicular adenopathy. No mediastinal or hilar adenopathy. No pericardial fluid. Esophagus normal. Port in the anterior chest wall with tip in distal SVC. Lungs/Pleura: Bilateral pleural effusions increased from prior. Interstitial thickening suggesting interstitial edema is new from prior. Nodular density in the RIGHT lower lobe measuring 13 mm (image 71/4) compared to 12 mm on prior. Nodule in the lateral aspect RIGHT middle lobe measuring 14 mm is unchanged. Musculoskeletal: Review of the MIP images confirms the above findings. CT ABDOMEN and PELVIS FINDINGS Hepatobiliary: Cyst in the superior central LEFT hepatic lobe is unchanged. No duct dilatation evident. Periportal edema is present. Gallbladder is nondistended. Common bile duct nondistended. Pancreas: Severe dilatation of  the pancreatic duct in the body and tail. No change from comparison exam. Calcification in the head of the pancreas. Dilatation extends of 2 18 mm in the mid pancreas not changed from prior. Spleen: Normal spleen Adrenals/urinary tract: Solitary RIGHT kidney. Ureter and bladder normal. Stomach/Bowel: Stomach, small bowel, appendix, and cecum are normal. Moderate volume stool throughout the colon. Multiple diverticula of the descending colon and sigmoid colon without acute inflammation. Vascular/Lymphatic: Abdominal aorta is normal caliber with atherosclerotic calcification. There is no retroperitoneal or periportal lymphadenopathy. No pelvic lymphadenopathy. Reproductive: Unremarkable Other: No omental peritoneal metastasis Musculoskeletal: No aggressive osseous lesion.  Stable sclerotic lesion in the inferior LEFT ischium. Post RIGHT hip arthroplasty Review of the MIP images confirms the above findings. IMPRESSION: CHEST: 1. No evidence acute pulmonary embolism. 2. Increased bilateral pleural effusions and interstitial edema. 3. Stable pulmonary nodules in the RIGHT middle lobe and RIGHT lower lobe. PELVIS: 1. No change in severe duct dilatation of the pancreas. No biliary duct dilatation. Pancreatic mass is difficult to define CT imaging 2. No evidence of metastatic disease in the abdomen pelvis. 3. Solitary RIGHT kidney appears normal Electronically Signed   By: Deboraha Fallow M.D.   On: 12/07/2023 15:49   CT ABDOMEN PELVIS W CONTRAST Result Date: 12/07/2023 CLINICAL DATA:  Concern for pulmonary embolism. Abdominal pain and diarrhea. Pancreatic cancer undergoing therapy. * Tracking Code: BO * EXAM: CT ANGIOGRAPHY CHEST CT ABDOMEN AND PELVIS WITH CONTRAST TECHNIQUE: Multidetector CT imaging of the chest was performed using the standard protocol during bolus administration of intravenous contrast. Multiplanar CT image reconstructions and MIPs were obtained to evaluate the vascular anatomy. Multidetector CT  imaging of the abdomen and pelvis was performed using the standard protocol during bolus administration of intravenous contrast. RADIATION DOSE REDUCTION: This exam was performed according to the departmental dose-optimization program which includes automated exposure control, adjustment of the mA and/or kV according to patient size and/or use of iterative reconstruction technique. CONTRAST:  85mL OMNIPAQUE  IOHEXOL  350 MG/ML SOLN COMPARISON:  10/01/2023 FINDINGS: CTA CHEST FINDINGS Cardiovascular: No filling defects within the pulmonary arteries to suggest acute pulmonary embolism. Mediastinum/Nodes: No axillary or supraclavicular adenopathy. No mediastinal or hilar adenopathy. No pericardial fluid. Esophagus normal. Port in the anterior chest wall with tip in distal SVC. Lungs/Pleura: Bilateral pleural effusions increased from prior. Interstitial thickening suggesting interstitial edema is new from prior. Nodular density in the RIGHT lower lobe measuring 13 mm (image 71/4) compared to 12 mm on prior. Nodule in the lateral aspect RIGHT middle lobe measuring 14 mm is unchanged. Musculoskeletal: Review of the MIP images confirms the above findings. CT ABDOMEN and PELVIS FINDINGS Hepatobiliary: Cyst in the superior central LEFT hepatic lobe is unchanged. No duct dilatation evident. Periportal edema is present. Gallbladder is nondistended. Common bile duct nondistended. Pancreas: Severe dilatation of the pancreatic duct in the body and tail. No change from comparison exam. Calcification in the head of the pancreas. Dilatation extends of 2 18 mm in the mid pancreas not changed from prior. Spleen: Normal spleen Adrenals/urinary tract: Solitary RIGHT kidney. Ureter and bladder normal. Stomach/Bowel: Stomach, small bowel, appendix, and cecum are normal. Moderate volume stool throughout the colon. Multiple diverticula of the descending colon and sigmoid colon without acute inflammation. Vascular/Lymphatic: Abdominal aorta is  normal caliber with atherosclerotic calcification. There is no retroperitoneal or periportal lymphadenopathy. No pelvic lymphadenopathy. Reproductive: Unremarkable Other: No omental peritoneal metastasis Musculoskeletal: No aggressive osseous lesion. Stable sclerotic lesion in the inferior LEFT ischium. Post RIGHT hip arthroplasty Review of the MIP images confirms the above findings. IMPRESSION: CHEST: 1. No evidence acute pulmonary embolism. 2. Increased bilateral pleural effusions and interstitial edema. 3. Stable pulmonary nodules in the RIGHT middle lobe and RIGHT lower lobe. PELVIS: 1. No change in severe duct dilatation of the pancreas. No biliary duct dilatation. Pancreatic mass is difficult to define CT imaging 2. No evidence of metastatic disease in the abdomen pelvis. 3. Solitary RIGHT kidney appears normal Electronically Signed   By: Deboraha Fallow M.D.   On: 12/07/2023 15:49    Microbiology: No results found for this or any previous visit (from the  past 240 hours).   Labs: Basic Metabolic Panel: Recent Labs  Lab 01/01/24 0345 01/02/24 0216 01/03/24 0243 01/04/24 0228 01/05/24 0225  NA 138 137 137 137 137  K 3.5 3.5 3.4* 3.6 3.9  CL 102 100 97* 98 96*  CO2 23 25 29 29 28   GLUCOSE 123* 117* 106* 147* 98  BUN 22 26* 27* 25* 30*  CREATININE 1.13 0.92 1.11 0.87 1.10  CALCIUM  8.9 8.8* 8.9 8.9 9.1  MG 1.9  --   --   --   --    Liver Function Tests: Recent Labs  Lab 12/30/23 1304 12/31/23 2200 01/01/24 0345 01/02/24 0216  AST 78* 105* 70* 40  ALT 12 12 8 10   ALKPHOS 102 131* 106 90  BILITOT 1.0 1.8* 1.8* 1.6*  PROT 6.3* 7.3 6.5 5.9*  ALBUMIN  3.6 3.5 3.2* 2.8*   No results for input(s): "LIPASE", "AMYLASE" in the last 168 hours. No results for input(s): "AMMONIA" in the last 168 hours. CBC: Recent Labs  Lab 12/30/23 1304 12/30/23 1304 12/31/23 2200 12/31/23 2229 01/01/24 0345 01/02/24 0216 01/03/24 0243 01/04/24 0228  WBC 7.8  --  13.4*  --  15.5* 18.9* 12.0*  8.8  NEUTROABS 5.8  --  11.7*  --   --   --   --   --   HGB 9.1*   < > 10.8* 12.2* 10.3* 10.4* 10.6* 10.6*  HCT 28.0*  --  34.1* 36.0* 31.4* 32.0* 33.3* 33.2*  MCV 95.6  --  98.8  --  98.4 94.7 96.2 95.4  PLT 172  --  256  --  222 302 383 430*   < > = values in this interval not displayed.   Cardiac Enzymes: No results for input(s): "CKTOTAL", "CKMB", "CKMBINDEX", "TROPONINI" in the last 168 hours. BNP: BNP (last 3 results) Recent Labs    12/07/23 1219 12/31/23 2200  BNP 778.8* 1,757.0*    ProBNP (last 3 results) Recent Labs    12/28/23 1532  PROBNP 10,756.0*    CBG: Recent Labs  Lab 12/31/23 2003  GLUCAP 116*       Signed:  Deforest Fast MD.  [facility] 01/05/2024, 9:23 AM

## 2024-01-05 NOTE — Progress Notes (Signed)
 Occupational Therapy Treatment Patient Details Name: [name] [name] MRN: [id number] DOB: 16-Nov-1940 Today's Date: 01/05/2024   History of present illness [name] [name] is an 82/M admitted with acute on chronic systolic CHF, pleural effusions. Pt with metastatic prostate cancer, pancreatic cancer, lung metastasis. PMH: Parkinson's disease, chronic systolic CHF, paroxysmal A-fib, OSA on BiPAP, protein calorie malnutrition, recent hospitalization earlier this month with CHF, L2 kyphoplasty, T8 compression fracture 12/24.   OT comments  Pt received up in recliner and agreeable to therapy session with encouragement. Pt completing sit to stand with CGA-minA for safety and assist to power up from chair. Pt ambulating to sink however once there required seated rest break, self care completed while seated. Following, pt ambulating short distance in hallway but had to return to room shortly after following c/o LE fatigue. Pt returned to recliner then requesting to use bathroom. Pt ambulating to bathroom and required increased assist to transfer on/off toilet. Void successful but no BM occurring. Pt returned to recliner once more and left with all needs met. Acute OT to continue to follow to address established goals to facilitate DC to next venue of care.        If plan is discharge home, recommend the following:  A little help with walking and/or transfers;A little help with bathing/dressing/bathroom;Assistance with cooking/housework;Direct supervision/assist for medications management;Assist for transportation;Help with stairs or ramp for entrance   Equipment Recommendations  None recommended by OT    Recommendations for Other Services      Precautions / Restrictions Precautions Precautions: Fall Recall of Precautions/Restrictions: Intact Restrictions Weight Bearing Restrictions Per Provider Order: No       Mobility Bed Mobility               General bed mobility comments: up in  chair    Transfers Overall transfer level: Needs assistance Equipment used: Rolling walker (2 wheels) Transfers: Sit to/from Stand Sit to Stand: Contact guard assist, Min assist           General transfer comment: pt slow to rise and needed assistance for power up and stability once up     Balance Overall balance assessment: Needs assistance Sitting-balance support: Feet supported Sitting balance-Leahy Scale: Fair Sitting balance - Comments: tolerates sitting in chair and on toilet without assist   Standing balance support: Reliant on assistive device for balance, During functional activity Standing balance-Leahy Scale: Poor Standing balance comment: poor stability when standing                           ADL either performed or assessed with clinical judgement   ADL Overall ADL's : Needs assistance/impaired     Grooming: Oral care;Wash/dry hands;Wash/dry face;Sitting;Supervision/safety Grooming Details (indicate cue type and reason): supervision for safety while seated         Upper Body Dressing : Minimal assistance;Sitting Upper Body Dressing Details (indicate cue type and reason): to don gown     Toilet Transfer: Contact guard assist;Minimal assistance;Rolling walker (2 wheels);Ambulation;Regular Teacher, adult education Details (indicate cue type and reason): CGA to lower self down and cues for body placement, rising from toilet pt needing min A to stand from low surface Toileting- Clothing Manipulation and Hygiene: Contact guard assist Toileting - Clothing Manipulation Details (indicate cue type and reason): CGA for safety     Functional mobility during ADLs: Contact guard assist;Rolling walker (2 wheels);Cueing for safety General ADL Comments: Self care completed while seated at sink. Pt declined  further self care/grooming. Able to don gown but needed minA for managing gown around lines and leads. Pt requesting bathroom and needed extra assist to  transfer on and off toilet seat, appeared very limited by fatigue and decreased strength    Extremity/Trunk Assessment              Vision       Perception     Praxis     Communication Communication Communication: Impaired Factors Affecting Communication: Reduced clarity of speech   Cognition Arousal: Alert Behavior During Therapy: WFL for tasks assessed/performed Cognition: No apparent impairments             OT - Cognition Comments: increased time to respond                 Following commands: Intact        Cueing   Cueing Techniques: Verbal cues  Exercises      Shoulder Instructions       General Comments VSS on RA    Pertinent Vitals/ Pain       Pain Assessment Pain Assessment: No/denies pain Faces Pain Scale: No hurt Pain Intervention(s): Monitored during session  Home Living                                          Prior Functioning/Environment              Frequency  Min 2X/week        Progress Toward Goals  OT Goals(current goals can now be found in the care plan section)  Progress towards OT goals: Progressing toward goals  Acute Rehab OT Goals Patient Stated Goal: none stated this date OT Goal Formulation: With patient/family Time For Goal Achievement: 01/17/24 Potential to Achieve Goals: Good ADL Goals Pt Will Transfer to Toilet: with supervision;ambulating Pt/caregiver will Perform Home Exercise Program: Increased strength;With Supervision;With theraband;With written HEP provided Additional ADL Goal #1: Pt/family will independently verbalize 3 strategies to reduce risk of falls Additional ADL Goal #2: Pt will verbalize 3 energy conservation strategies  Plan      Co-evaluation                 AM-PAC OT "6 Clicks" Daily Activity     Outcome Measure   Help from another person eating meals?: None Help from another person taking care of personal grooming?: A Little Help from another  person toileting, which includes using toliet, bedpan, or urinal?: A Little Help from another person bathing (including washing, rinsing, drying)?: A Lot Help from another person to put on and taking off regular upper body clothing?: A Little Help from another person to put on and taking off regular lower body clothing?: A Lot 6 Click Score: 17    End of Session Equipment Utilized During Treatment: Gait belt;Rolling walker (2 wheels)  OT Visit Diagnosis: Unsteadiness on feet (R26.81);Other abnormalities of gait and mobility (R26.89);Muscle weakness (generalized) (M62.81);Pain Pain - Right/Left: Right Pain - part of body: Leg   Activity Tolerance Patient tolerated treatment well   Patient Left in chair;with call bell/phone within reach   Nurse Communication Mobility status        Time: 1610-9604 OT Time Calculation (min): 36 min  Charges: OT General Charges $OT Visit: 1 Visit OT Treatments $Self Care/Home Management : 23-37 mins  Lakiesha Ralphs, BS, OTA/S   Duvall Comes 01/05/2024, 2:14 PM

## 2024-01-05 NOTE — TOC Transition Note (Signed)
 Transition of Care [facility]) - Discharge Note   Patient Details  Name: [name] [name] MRN: [id number] Date of Birth: 06/07/1941  Transition of Care [facility]) CM/SW Contact:  Jeani Mill, RN Phone Number: 01/05/2024, 12:30 PM   Clinical Narrative:    Patient stable for discharge.  All DME have been delivered.  Daughter is at [facility] waiting for patient.  PTAR called.  [facility] home hospice will admitted patient today.     Final next level of care: Home w Hospice Care Barriers to Discharge: Barriers Resolved   Patient Goals and CMS Choice Patient states their goals for this hospitalization and ongoing recovery are:: to go back to [facility] with Home hospice CMS Medicare.gov Compare Post Acute Care list provided to:: Other (Comment Required) Choice offered to / list presented to : Adult Children      Discharge Placement                 [facility]      Discharge Plan and Services Additional resources added to the After Visit Summary for                                       Social Drivers of Health (SDOH) Interventions SDOH Screenings   Food Insecurity: No Food Insecurity (01/02/2024)  Housing: Low Risk  (01/01/2024)  Transportation Needs: No Transportation Needs (01/01/2024)  Utilities: Not At Risk (01/01/2024)  Depression (PHQ2-9): Low Risk  (12/23/2020)  Recent Concern: Depression (PHQ2-9) - Medium Risk (11/26/2020)  Social Connections: Moderately Isolated (01/02/2024)  Tobacco Use: Medium Risk (12/31/2023)     Readmission Risk Interventions    01/05/2024   12:30 PM 08/02/2023   12:37 PM  Readmission Risk Prevention Plan  Transportation Screening Complete Complete  PCP or Specialist Appt within 3-5 Days  Complete  HRI or Home Care Consult  Complete  Social Work Consult for Recovery Care Planning/Counseling  Complete  Palliative Care Screening  Not Applicable  Medication Review [employer]) Complete Complete  PCP or Specialist  appointment within 3-5 days of discharge Not Complete   PCP/Specialist Appt Not Complete comments home with hospice   HRI or Home Care Consult Complete   SW Recovery Care/Counseling Consult Complete   Palliative Care Screening Complete   Skilled Nursing Facility Not Applicable

## 2024-01-05 NOTE — Plan of Care (Signed)
  Problem: Education: Goal: Ability to demonstrate management of disease process will improve Outcome: Progressing Goal: Ability to verbalize understanding of medication therapies will improve Outcome: Progressing Goal: Individualized Educational Video(s) Outcome: Progressing   Problem: Activity: Goal: Capacity to carry out activities will improve Outcome: Progressing   Problem: Cardiac: Goal: Ability to achieve and maintain adequate cardiopulmonary perfusion will improve Outcome: Progressing   Problem: Education: Goal: Knowledge of disease or condition will improve Outcome: Progressing Goal: Understanding of medication regimen will improve Outcome: Progressing Goal: Individualized Educational Video(s) Outcome: Progressing   Problem: Activity: Goal: Ability to tolerate increased activity will improve Outcome: Progressing   Problem: Cardiac: Goal: Ability to achieve and maintain adequate cardiopulmonary perfusion will improve Outcome: Progressing   Problem: Health Behavior/Discharge Planning: Goal: Ability to safely manage health-related needs after discharge will improve Outcome: Progressing   Problem: Education: Goal: Knowledge of General Education information will improve Description: Including pain rating scale, medication(s)/side effects and non-pharmacologic comfort measures Outcome: Progressing   Problem: Health Behavior/Discharge Planning: Goal: Ability to manage health-related needs will improve Outcome: Progressing   Problem: Clinical Measurements: Goal: Ability to maintain clinical measurements within normal limits will improve Outcome: Progressing Goal: Will remain free from infection Outcome: Progressing Goal: Diagnostic test results will improve Outcome: Progressing Goal: Respiratory complications will improve Outcome: Progressing Goal: Cardiovascular complication will be avoided Outcome: Progressing   Problem: Activity: Goal: Risk for activity  intolerance will decrease Outcome: Progressing   Problem: Nutrition: Goal: Adequate nutrition will be maintained Outcome: Progressing   Problem: Coping: Goal: Level of anxiety will decrease Outcome: Progressing   Problem: Elimination: Goal: Will not experience complications related to bowel motility Outcome: Progressing Goal: Will not experience complications related to urinary retention Outcome: Progressing   Problem: Pain Managment: Goal: General experience of comfort will improve and/or be controlled Outcome: Progressing   Problem: Safety: Goal: Ability to remain free from injury will improve Outcome: Progressing   Problem: Skin Integrity: Goal: Risk for impaired skin integrity will decrease Outcome: Progressing

## 2024-01-06 DIAGNOSIS — G20C Parkinsonism, unspecified: Secondary | ICD-10-CM | POA: Diagnosis not present

## 2024-01-06 DIAGNOSIS — C7800 Secondary malignant neoplasm of unspecified lung: Secondary | ICD-10-CM | POA: Diagnosis not present

## 2024-01-06 DIAGNOSIS — C259 Malignant neoplasm of pancreas, unspecified: Secondary | ICD-10-CM | POA: Diagnosis not present

## 2024-01-06 DIAGNOSIS — C61 Malignant neoplasm of prostate: Secondary | ICD-10-CM | POA: Diagnosis not present

## 2024-01-06 DIAGNOSIS — R4702 Dysphasia: Secondary | ICD-10-CM | POA: Diagnosis not present

## 2024-01-06 DIAGNOSIS — D649 Anemia, unspecified: Secondary | ICD-10-CM | POA: Diagnosis not present

## 2024-01-06 DIAGNOSIS — J90 Pleural effusion, not elsewhere classified: Secondary | ICD-10-CM | POA: Diagnosis not present

## 2024-01-06 DIAGNOSIS — G4730 Sleep apnea, unspecified: Secondary | ICD-10-CM | POA: Diagnosis not present

## 2024-01-06 DIAGNOSIS — I4891 Unspecified atrial fibrillation: Secondary | ICD-10-CM | POA: Diagnosis not present

## 2024-01-06 DIAGNOSIS — C7951 Secondary malignant neoplasm of bone: Secondary | ICD-10-CM | POA: Diagnosis not present

## 2024-01-06 DIAGNOSIS — M8000XA Age-related osteoporosis with current pathological fracture, unspecified site, initial encounter for fracture: Secondary | ICD-10-CM | POA: Diagnosis not present

## 2024-01-06 DIAGNOSIS — N401 Enlarged prostate with lower urinary tract symptoms: Secondary | ICD-10-CM | POA: Diagnosis not present

## 2024-01-07 DIAGNOSIS — C259 Malignant neoplasm of pancreas, unspecified: Secondary | ICD-10-CM | POA: Diagnosis not present

## 2024-01-07 DIAGNOSIS — C61 Malignant neoplasm of prostate: Secondary | ICD-10-CM | POA: Diagnosis not present

## 2024-01-07 DIAGNOSIS — C7800 Secondary malignant neoplasm of unspecified lung: Secondary | ICD-10-CM | POA: Diagnosis not present

## 2024-01-07 DIAGNOSIS — I4891 Unspecified atrial fibrillation: Secondary | ICD-10-CM | POA: Diagnosis not present

## 2024-01-07 DIAGNOSIS — J90 Pleural effusion, not elsewhere classified: Secondary | ICD-10-CM | POA: Diagnosis not present

## 2024-01-07 DIAGNOSIS — C7951 Secondary malignant neoplasm of bone: Secondary | ICD-10-CM | POA: Diagnosis not present

## 2024-01-10 DIAGNOSIS — C259 Malignant neoplasm of pancreas, unspecified: Secondary | ICD-10-CM | POA: Diagnosis not present

## 2024-01-10 DIAGNOSIS — C61 Malignant neoplasm of prostate: Secondary | ICD-10-CM | POA: Diagnosis not present

## 2024-01-10 DIAGNOSIS — C7800 Secondary malignant neoplasm of unspecified lung: Secondary | ICD-10-CM | POA: Diagnosis not present

## 2024-01-10 DIAGNOSIS — C7951 Secondary malignant neoplasm of bone: Secondary | ICD-10-CM | POA: Diagnosis not present

## 2024-01-10 DIAGNOSIS — J90 Pleural effusion, not elsewhere classified: Secondary | ICD-10-CM | POA: Diagnosis not present

## 2024-01-10 DIAGNOSIS — I4891 Unspecified atrial fibrillation: Secondary | ICD-10-CM | POA: Diagnosis not present

## 2024-01-10 NOTE — Pre-Procedure Instructions (Signed)
 Patient called cath lab, states he was recently discharged under hospice care.  He is asking if he still needed the cardioversion scheduled for 01/17/24.  Messaged Clint Fenton, he reviewed the chart and agrees that cardioversion should be canceled.  Called patient back to inform him that cardioversion is cancelled.

## 2024-01-12 DIAGNOSIS — C259 Malignant neoplasm of pancreas, unspecified: Secondary | ICD-10-CM | POA: Diagnosis not present

## 2024-01-12 DIAGNOSIS — J90 Pleural effusion, not elsewhere classified: Secondary | ICD-10-CM | POA: Diagnosis not present

## 2024-01-12 DIAGNOSIS — C7800 Secondary malignant neoplasm of unspecified lung: Secondary | ICD-10-CM | POA: Diagnosis not present

## 2024-01-12 DIAGNOSIS — C7951 Secondary malignant neoplasm of bone: Secondary | ICD-10-CM | POA: Diagnosis not present

## 2024-01-12 DIAGNOSIS — C61 Malignant neoplasm of prostate: Secondary | ICD-10-CM | POA: Diagnosis not present

## 2024-01-12 DIAGNOSIS — I4891 Unspecified atrial fibrillation: Secondary | ICD-10-CM | POA: Diagnosis not present

## 2024-01-13 ENCOUNTER — Encounter

## 2024-01-13 ENCOUNTER — Other Ambulatory Visit: Payer: Medicare Other

## 2024-01-13 ENCOUNTER — Ambulatory Visit: Payer: Medicare Other

## 2024-01-13 ENCOUNTER — Ambulatory Visit: Payer: Medicare Other | Admitting: Nurse Practitioner

## 2024-01-13 DIAGNOSIS — J90 Pleural effusion, not elsewhere classified: Secondary | ICD-10-CM | POA: Diagnosis not present

## 2024-01-13 DIAGNOSIS — C61 Malignant neoplasm of prostate: Secondary | ICD-10-CM | POA: Diagnosis not present

## 2024-01-13 DIAGNOSIS — I4891 Unspecified atrial fibrillation: Secondary | ICD-10-CM | POA: Diagnosis not present

## 2024-01-13 DIAGNOSIS — C7951 Secondary malignant neoplasm of bone: Secondary | ICD-10-CM | POA: Diagnosis not present

## 2024-01-13 DIAGNOSIS — C7800 Secondary malignant neoplasm of unspecified lung: Secondary | ICD-10-CM | POA: Diagnosis not present

## 2024-01-13 DIAGNOSIS — C259 Malignant neoplasm of pancreas, unspecified: Secondary | ICD-10-CM | POA: Diagnosis not present

## 2024-01-14 DIAGNOSIS — C259 Malignant neoplasm of pancreas, unspecified: Secondary | ICD-10-CM | POA: Diagnosis not present

## 2024-01-14 DIAGNOSIS — C61 Malignant neoplasm of prostate: Secondary | ICD-10-CM | POA: Diagnosis not present

## 2024-01-14 DIAGNOSIS — I4891 Unspecified atrial fibrillation: Secondary | ICD-10-CM | POA: Diagnosis not present

## 2024-01-14 DIAGNOSIS — J90 Pleural effusion, not elsewhere classified: Secondary | ICD-10-CM | POA: Diagnosis not present

## 2024-01-14 DIAGNOSIS — C7951 Secondary malignant neoplasm of bone: Secondary | ICD-10-CM | POA: Diagnosis not present

## 2024-01-14 DIAGNOSIS — C7800 Secondary malignant neoplasm of unspecified lung: Secondary | ICD-10-CM | POA: Diagnosis not present

## 2024-01-17 ENCOUNTER — Ambulatory Visit (HOSPITAL_COMMUNITY): Admission: RE | Admit: 2024-01-17 | Source: Home / Self Care | Admitting: Internal Medicine

## 2024-01-17 ENCOUNTER — Encounter (HOSPITAL_COMMUNITY): Admission: RE | Payer: Self-pay | Source: Home / Self Care

## 2024-01-17 DIAGNOSIS — I4819 Other persistent atrial fibrillation: Secondary | ICD-10-CM

## 2024-01-17 DIAGNOSIS — C7951 Secondary malignant neoplasm of bone: Secondary | ICD-10-CM | POA: Diagnosis not present

## 2024-01-17 DIAGNOSIS — C7800 Secondary malignant neoplasm of unspecified lung: Secondary | ICD-10-CM | POA: Diagnosis not present

## 2024-01-17 DIAGNOSIS — C259 Malignant neoplasm of pancreas, unspecified: Secondary | ICD-10-CM | POA: Diagnosis not present

## 2024-01-17 DIAGNOSIS — C61 Malignant neoplasm of prostate: Secondary | ICD-10-CM | POA: Diagnosis not present

## 2024-01-17 DIAGNOSIS — J90 Pleural effusion, not elsewhere classified: Secondary | ICD-10-CM | POA: Diagnosis not present

## 2024-01-17 DIAGNOSIS — I4891 Unspecified atrial fibrillation: Secondary | ICD-10-CM | POA: Diagnosis not present

## 2024-01-17 SURGERY — CARDIOVERSION (CATH LAB)
Anesthesia: General

## 2024-01-18 ENCOUNTER — Other Ambulatory Visit: Payer: Self-pay

## 2024-01-18 DIAGNOSIS — G20B1 Parkinson's disease with dyskinesia, without mention of fluctuations: Secondary | ICD-10-CM | POA: Diagnosis not present

## 2024-01-18 DIAGNOSIS — I482 Chronic atrial fibrillation, unspecified: Secondary | ICD-10-CM | POA: Diagnosis not present

## 2024-01-18 DIAGNOSIS — G4733 Obstructive sleep apnea (adult) (pediatric): Secondary | ICD-10-CM | POA: Diagnosis not present

## 2024-01-18 DIAGNOSIS — G4752 REM sleep behavior disorder: Secondary | ICD-10-CM | POA: Diagnosis not present

## 2024-01-18 DIAGNOSIS — C61 Malignant neoplasm of prostate: Secondary | ICD-10-CM | POA: Diagnosis not present

## 2024-01-19 DIAGNOSIS — C7951 Secondary malignant neoplasm of bone: Secondary | ICD-10-CM | POA: Diagnosis not present

## 2024-01-19 DIAGNOSIS — C7800 Secondary malignant neoplasm of unspecified lung: Secondary | ICD-10-CM | POA: Diagnosis not present

## 2024-01-19 DIAGNOSIS — C259 Malignant neoplasm of pancreas, unspecified: Secondary | ICD-10-CM | POA: Diagnosis not present

## 2024-01-19 DIAGNOSIS — J90 Pleural effusion, not elsewhere classified: Secondary | ICD-10-CM | POA: Diagnosis not present

## 2024-01-19 DIAGNOSIS — I4891 Unspecified atrial fibrillation: Secondary | ICD-10-CM | POA: Diagnosis not present

## 2024-01-19 DIAGNOSIS — C61 Malignant neoplasm of prostate: Secondary | ICD-10-CM | POA: Diagnosis not present

## 2024-01-21 DIAGNOSIS — I4891 Unspecified atrial fibrillation: Secondary | ICD-10-CM | POA: Diagnosis not present

## 2024-01-21 DIAGNOSIS — C7951 Secondary malignant neoplasm of bone: Secondary | ICD-10-CM | POA: Diagnosis not present

## 2024-01-21 DIAGNOSIS — C7800 Secondary malignant neoplasm of unspecified lung: Secondary | ICD-10-CM | POA: Diagnosis not present

## 2024-01-21 DIAGNOSIS — C61 Malignant neoplasm of prostate: Secondary | ICD-10-CM | POA: Diagnosis not present

## 2024-01-21 DIAGNOSIS — C259 Malignant neoplasm of pancreas, unspecified: Secondary | ICD-10-CM | POA: Diagnosis not present

## 2024-01-21 DIAGNOSIS — J90 Pleural effusion, not elsewhere classified: Secondary | ICD-10-CM | POA: Diagnosis not present

## 2024-01-24 ENCOUNTER — Telehealth: Payer: Self-pay | Admitting: Nurse Practitioner

## 2024-01-25 DIAGNOSIS — I4891 Unspecified atrial fibrillation: Secondary | ICD-10-CM | POA: Diagnosis not present

## 2024-01-25 DIAGNOSIS — C259 Malignant neoplasm of pancreas, unspecified: Secondary | ICD-10-CM | POA: Diagnosis not present

## 2024-01-25 DIAGNOSIS — C7951 Secondary malignant neoplasm of bone: Secondary | ICD-10-CM | POA: Diagnosis not present

## 2024-01-25 DIAGNOSIS — C61 Malignant neoplasm of prostate: Secondary | ICD-10-CM | POA: Diagnosis not present

## 2024-01-25 DIAGNOSIS — J90 Pleural effusion, not elsewhere classified: Secondary | ICD-10-CM | POA: Diagnosis not present

## 2024-01-25 DIAGNOSIS — C7800 Secondary malignant neoplasm of unspecified lung: Secondary | ICD-10-CM | POA: Diagnosis not present

## 2024-01-26 DIAGNOSIS — I4891 Unspecified atrial fibrillation: Secondary | ICD-10-CM | POA: Diagnosis not present

## 2024-01-26 DIAGNOSIS — C7951 Secondary malignant neoplasm of bone: Secondary | ICD-10-CM | POA: Diagnosis not present

## 2024-01-26 DIAGNOSIS — C61 Malignant neoplasm of prostate: Secondary | ICD-10-CM | POA: Diagnosis not present

## 2024-01-26 DIAGNOSIS — J90 Pleural effusion, not elsewhere classified: Secondary | ICD-10-CM | POA: Diagnosis not present

## 2024-01-26 DIAGNOSIS — C7800 Secondary malignant neoplasm of unspecified lung: Secondary | ICD-10-CM | POA: Diagnosis not present

## 2024-01-26 DIAGNOSIS — C259 Malignant neoplasm of pancreas, unspecified: Secondary | ICD-10-CM | POA: Diagnosis not present

## 2024-01-27 ENCOUNTER — Encounter: Admitting: Dietician

## 2024-01-27 ENCOUNTER — Inpatient Hospital Stay

## 2024-01-27 ENCOUNTER — Ambulatory Visit: Admitting: Hematology

## 2024-01-27 ENCOUNTER — Other Ambulatory Visit

## 2024-01-27 ENCOUNTER — Ambulatory Visit

## 2024-01-27 DIAGNOSIS — C7951 Secondary malignant neoplasm of bone: Secondary | ICD-10-CM | POA: Diagnosis not present

## 2024-01-27 DIAGNOSIS — J90 Pleural effusion, not elsewhere classified: Secondary | ICD-10-CM | POA: Diagnosis not present

## 2024-01-27 DIAGNOSIS — I4891 Unspecified atrial fibrillation: Secondary | ICD-10-CM | POA: Diagnosis not present

## 2024-01-27 DIAGNOSIS — C61 Malignant neoplasm of prostate: Secondary | ICD-10-CM | POA: Diagnosis not present

## 2024-01-27 DIAGNOSIS — C7800 Secondary malignant neoplasm of unspecified lung: Secondary | ICD-10-CM | POA: Diagnosis not present

## 2024-01-27 DIAGNOSIS — C259 Malignant neoplasm of pancreas, unspecified: Secondary | ICD-10-CM | POA: Diagnosis not present

## 2024-01-28 DIAGNOSIS — I4891 Unspecified atrial fibrillation: Secondary | ICD-10-CM | POA: Diagnosis not present

## 2024-01-28 DIAGNOSIS — C61 Malignant neoplasm of prostate: Secondary | ICD-10-CM | POA: Diagnosis not present

## 2024-01-28 DIAGNOSIS — J90 Pleural effusion, not elsewhere classified: Secondary | ICD-10-CM | POA: Diagnosis not present

## 2024-01-28 DIAGNOSIS — C7800 Secondary malignant neoplasm of unspecified lung: Secondary | ICD-10-CM | POA: Diagnosis not present

## 2024-01-28 DIAGNOSIS — C7951 Secondary malignant neoplasm of bone: Secondary | ICD-10-CM | POA: Diagnosis not present

## 2024-01-28 DIAGNOSIS — C259 Malignant neoplasm of pancreas, unspecified: Secondary | ICD-10-CM | POA: Diagnosis not present

## 2024-01-31 DIAGNOSIS — I4891 Unspecified atrial fibrillation: Secondary | ICD-10-CM | POA: Diagnosis not present

## 2024-01-31 DIAGNOSIS — C61 Malignant neoplasm of prostate: Secondary | ICD-10-CM | POA: Diagnosis not present

## 2024-01-31 DIAGNOSIS — C7800 Secondary malignant neoplasm of unspecified lung: Secondary | ICD-10-CM | POA: Diagnosis not present

## 2024-01-31 DIAGNOSIS — J90 Pleural effusion, not elsewhere classified: Secondary | ICD-10-CM | POA: Diagnosis not present

## 2024-01-31 DIAGNOSIS — C259 Malignant neoplasm of pancreas, unspecified: Secondary | ICD-10-CM | POA: Diagnosis not present

## 2024-01-31 DIAGNOSIS — C7951 Secondary malignant neoplasm of bone: Secondary | ICD-10-CM | POA: Diagnosis not present

## 2024-02-02 ENCOUNTER — Ambulatory Visit (HOSPITAL_COMMUNITY): Admitting: Internal Medicine

## 2024-02-02 DIAGNOSIS — C7800 Secondary malignant neoplasm of unspecified lung: Secondary | ICD-10-CM | POA: Diagnosis not present

## 2024-02-02 DIAGNOSIS — I4891 Unspecified atrial fibrillation: Secondary | ICD-10-CM | POA: Diagnosis not present

## 2024-02-02 DIAGNOSIS — C7951 Secondary malignant neoplasm of bone: Secondary | ICD-10-CM | POA: Diagnosis not present

## 2024-02-02 DIAGNOSIS — J90 Pleural effusion, not elsewhere classified: Secondary | ICD-10-CM | POA: Diagnosis not present

## 2024-02-02 DIAGNOSIS — C259 Malignant neoplasm of pancreas, unspecified: Secondary | ICD-10-CM | POA: Diagnosis not present

## 2024-02-02 DIAGNOSIS — C61 Malignant neoplasm of prostate: Secondary | ICD-10-CM | POA: Diagnosis not present

## 2024-02-03 DIAGNOSIS — I4891 Unspecified atrial fibrillation: Secondary | ICD-10-CM | POA: Diagnosis not present

## 2024-02-03 DIAGNOSIS — C7800 Secondary malignant neoplasm of unspecified lung: Secondary | ICD-10-CM | POA: Diagnosis not present

## 2024-02-03 DIAGNOSIS — C61 Malignant neoplasm of prostate: Secondary | ICD-10-CM | POA: Diagnosis not present

## 2024-02-03 DIAGNOSIS — J90 Pleural effusion, not elsewhere classified: Secondary | ICD-10-CM | POA: Diagnosis not present

## 2024-02-03 DIAGNOSIS — C7951 Secondary malignant neoplasm of bone: Secondary | ICD-10-CM | POA: Diagnosis not present

## 2024-02-03 DIAGNOSIS — C259 Malignant neoplasm of pancreas, unspecified: Secondary | ICD-10-CM | POA: Diagnosis not present

## 2024-02-04 DIAGNOSIS — C7800 Secondary malignant neoplasm of unspecified lung: Secondary | ICD-10-CM | POA: Diagnosis not present

## 2024-02-04 DIAGNOSIS — J90 Pleural effusion, not elsewhere classified: Secondary | ICD-10-CM | POA: Diagnosis not present

## 2024-02-04 DIAGNOSIS — C7951 Secondary malignant neoplasm of bone: Secondary | ICD-10-CM | POA: Diagnosis not present

## 2024-02-04 DIAGNOSIS — C61 Malignant neoplasm of prostate: Secondary | ICD-10-CM | POA: Diagnosis not present

## 2024-02-04 DIAGNOSIS — I4891 Unspecified atrial fibrillation: Secondary | ICD-10-CM | POA: Diagnosis not present

## 2024-02-04 DIAGNOSIS — C259 Malignant neoplasm of pancreas, unspecified: Secondary | ICD-10-CM | POA: Diagnosis not present

## 2024-02-06 DIAGNOSIS — C259 Malignant neoplasm of pancreas, unspecified: Secondary | ICD-10-CM | POA: Diagnosis not present

## 2024-02-06 DIAGNOSIS — N401 Enlarged prostate with lower urinary tract symptoms: Secondary | ICD-10-CM | POA: Diagnosis not present

## 2024-02-06 DIAGNOSIS — C7800 Secondary malignant neoplasm of unspecified lung: Secondary | ICD-10-CM | POA: Diagnosis not present

## 2024-02-06 DIAGNOSIS — J90 Pleural effusion, not elsewhere classified: Secondary | ICD-10-CM | POA: Diagnosis not present

## 2024-02-06 DIAGNOSIS — C61 Malignant neoplasm of prostate: Secondary | ICD-10-CM | POA: Diagnosis not present

## 2024-02-06 DIAGNOSIS — G20C Parkinsonism, unspecified: Secondary | ICD-10-CM | POA: Diagnosis not present

## 2024-02-06 DIAGNOSIS — G4730 Sleep apnea, unspecified: Secondary | ICD-10-CM | POA: Diagnosis not present

## 2024-02-06 DIAGNOSIS — R4702 Dysphasia: Secondary | ICD-10-CM | POA: Diagnosis not present

## 2024-02-06 DIAGNOSIS — D649 Anemia, unspecified: Secondary | ICD-10-CM | POA: Diagnosis not present

## 2024-02-06 DIAGNOSIS — M8000XA Age-related osteoporosis with current pathological fracture, unspecified site, initial encounter for fracture: Secondary | ICD-10-CM | POA: Diagnosis not present

## 2024-02-06 DIAGNOSIS — C7951 Secondary malignant neoplasm of bone: Secondary | ICD-10-CM | POA: Diagnosis not present

## 2024-02-06 DIAGNOSIS — I4891 Unspecified atrial fibrillation: Secondary | ICD-10-CM | POA: Diagnosis not present

## 2024-02-08 DIAGNOSIS — J90 Pleural effusion, not elsewhere classified: Secondary | ICD-10-CM | POA: Diagnosis not present

## 2024-02-08 DIAGNOSIS — C61 Malignant neoplasm of prostate: Secondary | ICD-10-CM | POA: Diagnosis not present

## 2024-02-08 DIAGNOSIS — C259 Malignant neoplasm of pancreas, unspecified: Secondary | ICD-10-CM | POA: Diagnosis not present

## 2024-02-08 DIAGNOSIS — C7951 Secondary malignant neoplasm of bone: Secondary | ICD-10-CM | POA: Diagnosis not present

## 2024-02-08 DIAGNOSIS — I4891 Unspecified atrial fibrillation: Secondary | ICD-10-CM | POA: Diagnosis not present

## 2024-02-08 DIAGNOSIS — C7800 Secondary malignant neoplasm of unspecified lung: Secondary | ICD-10-CM | POA: Diagnosis not present

## 2024-02-09 DIAGNOSIS — C61 Malignant neoplasm of prostate: Secondary | ICD-10-CM | POA: Diagnosis not present

## 2024-02-09 DIAGNOSIS — C7800 Secondary malignant neoplasm of unspecified lung: Secondary | ICD-10-CM | POA: Diagnosis not present

## 2024-02-09 DIAGNOSIS — J90 Pleural effusion, not elsewhere classified: Secondary | ICD-10-CM | POA: Diagnosis not present

## 2024-02-09 DIAGNOSIS — C7951 Secondary malignant neoplasm of bone: Secondary | ICD-10-CM | POA: Diagnosis not present

## 2024-02-09 DIAGNOSIS — C259 Malignant neoplasm of pancreas, unspecified: Secondary | ICD-10-CM | POA: Diagnosis not present

## 2024-02-09 DIAGNOSIS — I4891 Unspecified atrial fibrillation: Secondary | ICD-10-CM | POA: Diagnosis not present

## 2024-02-10 DIAGNOSIS — J90 Pleural effusion, not elsewhere classified: Secondary | ICD-10-CM | POA: Diagnosis not present

## 2024-02-10 DIAGNOSIS — C7951 Secondary malignant neoplasm of bone: Secondary | ICD-10-CM | POA: Diagnosis not present

## 2024-02-10 DIAGNOSIS — C61 Malignant neoplasm of prostate: Secondary | ICD-10-CM | POA: Diagnosis not present

## 2024-02-10 DIAGNOSIS — C259 Malignant neoplasm of pancreas, unspecified: Secondary | ICD-10-CM | POA: Diagnosis not present

## 2024-02-10 DIAGNOSIS — I4891 Unspecified atrial fibrillation: Secondary | ICD-10-CM | POA: Diagnosis not present

## 2024-02-10 DIAGNOSIS — C7800 Secondary malignant neoplasm of unspecified lung: Secondary | ICD-10-CM | POA: Diagnosis not present

## 2024-02-11 DIAGNOSIS — C7800 Secondary malignant neoplasm of unspecified lung: Secondary | ICD-10-CM | POA: Diagnosis not present

## 2024-02-11 DIAGNOSIS — J90 Pleural effusion, not elsewhere classified: Secondary | ICD-10-CM | POA: Diagnosis not present

## 2024-02-11 DIAGNOSIS — C61 Malignant neoplasm of prostate: Secondary | ICD-10-CM | POA: Diagnosis not present

## 2024-02-11 DIAGNOSIS — C7951 Secondary malignant neoplasm of bone: Secondary | ICD-10-CM | POA: Diagnosis not present

## 2024-02-11 DIAGNOSIS — I4891 Unspecified atrial fibrillation: Secondary | ICD-10-CM | POA: Diagnosis not present

## 2024-02-11 DIAGNOSIS — C259 Malignant neoplasm of pancreas, unspecified: Secondary | ICD-10-CM | POA: Diagnosis not present

## 2024-02-12 DIAGNOSIS — J90 Pleural effusion, not elsewhere classified: Secondary | ICD-10-CM | POA: Diagnosis not present

## 2024-02-12 DIAGNOSIS — C61 Malignant neoplasm of prostate: Secondary | ICD-10-CM | POA: Diagnosis not present

## 2024-02-12 DIAGNOSIS — I4891 Unspecified atrial fibrillation: Secondary | ICD-10-CM | POA: Diagnosis not present

## 2024-02-12 DIAGNOSIS — C259 Malignant neoplasm of pancreas, unspecified: Secondary | ICD-10-CM | POA: Diagnosis not present

## 2024-02-12 DIAGNOSIS — C7951 Secondary malignant neoplasm of bone: Secondary | ICD-10-CM | POA: Diagnosis not present

## 2024-02-12 DIAGNOSIS — C7800 Secondary malignant neoplasm of unspecified lung: Secondary | ICD-10-CM | POA: Diagnosis not present

## 2024-02-13 DIAGNOSIS — C7951 Secondary malignant neoplasm of bone: Secondary | ICD-10-CM | POA: Diagnosis not present

## 2024-02-13 DIAGNOSIS — C259 Malignant neoplasm of pancreas, unspecified: Secondary | ICD-10-CM | POA: Diagnosis not present

## 2024-02-13 DIAGNOSIS — I4891 Unspecified atrial fibrillation: Secondary | ICD-10-CM | POA: Diagnosis not present

## 2024-02-13 DIAGNOSIS — C7800 Secondary malignant neoplasm of unspecified lung: Secondary | ICD-10-CM | POA: Diagnosis not present

## 2024-02-13 DIAGNOSIS — J90 Pleural effusion, not elsewhere classified: Secondary | ICD-10-CM | POA: Diagnosis not present

## 2024-02-13 DIAGNOSIS — C61 Malignant neoplasm of prostate: Secondary | ICD-10-CM | POA: Diagnosis not present

## 2024-02-14 DIAGNOSIS — C259 Malignant neoplasm of pancreas, unspecified: Secondary | ICD-10-CM | POA: Diagnosis not present

## 2024-02-14 DIAGNOSIS — C7800 Secondary malignant neoplasm of unspecified lung: Secondary | ICD-10-CM | POA: Diagnosis not present

## 2024-02-14 DIAGNOSIS — J90 Pleural effusion, not elsewhere classified: Secondary | ICD-10-CM | POA: Diagnosis not present

## 2024-02-14 DIAGNOSIS — C61 Malignant neoplasm of prostate: Secondary | ICD-10-CM | POA: Diagnosis not present

## 2024-02-14 DIAGNOSIS — I4891 Unspecified atrial fibrillation: Secondary | ICD-10-CM | POA: Diagnosis not present

## 2024-02-14 DIAGNOSIS — C7951 Secondary malignant neoplasm of bone: Secondary | ICD-10-CM | POA: Diagnosis not present

## 2024-02-16 DIAGNOSIS — I4891 Unspecified atrial fibrillation: Secondary | ICD-10-CM | POA: Diagnosis not present

## 2024-02-16 DIAGNOSIS — C7800 Secondary malignant neoplasm of unspecified lung: Secondary | ICD-10-CM | POA: Diagnosis not present

## 2024-02-16 DIAGNOSIS — J90 Pleural effusion, not elsewhere classified: Secondary | ICD-10-CM | POA: Diagnosis not present

## 2024-02-16 DIAGNOSIS — C61 Malignant neoplasm of prostate: Secondary | ICD-10-CM | POA: Diagnosis not present

## 2024-02-16 DIAGNOSIS — C259 Malignant neoplasm of pancreas, unspecified: Secondary | ICD-10-CM | POA: Diagnosis not present

## 2024-02-16 DIAGNOSIS — C7951 Secondary malignant neoplasm of bone: Secondary | ICD-10-CM | POA: Diagnosis not present

## 2024-02-18 DIAGNOSIS — C259 Malignant neoplasm of pancreas, unspecified: Secondary | ICD-10-CM | POA: Diagnosis not present

## 2024-02-18 DIAGNOSIS — J90 Pleural effusion, not elsewhere classified: Secondary | ICD-10-CM | POA: Diagnosis not present

## 2024-02-18 DIAGNOSIS — C7951 Secondary malignant neoplasm of bone: Secondary | ICD-10-CM | POA: Diagnosis not present

## 2024-02-18 DIAGNOSIS — C7800 Secondary malignant neoplasm of unspecified lung: Secondary | ICD-10-CM | POA: Diagnosis not present

## 2024-02-18 DIAGNOSIS — I4891 Unspecified atrial fibrillation: Secondary | ICD-10-CM | POA: Diagnosis not present

## 2024-02-18 DIAGNOSIS — C61 Malignant neoplasm of prostate: Secondary | ICD-10-CM | POA: Diagnosis not present

## 2024-02-20 DIAGNOSIS — C7951 Secondary malignant neoplasm of bone: Secondary | ICD-10-CM | POA: Diagnosis not present

## 2024-02-20 DIAGNOSIS — C61 Malignant neoplasm of prostate: Secondary | ICD-10-CM | POA: Diagnosis not present

## 2024-02-20 DIAGNOSIS — C7800 Secondary malignant neoplasm of unspecified lung: Secondary | ICD-10-CM | POA: Diagnosis not present

## 2024-02-20 DIAGNOSIS — I4891 Unspecified atrial fibrillation: Secondary | ICD-10-CM | POA: Diagnosis not present

## 2024-02-20 DIAGNOSIS — J90 Pleural effusion, not elsewhere classified: Secondary | ICD-10-CM | POA: Diagnosis not present

## 2024-02-20 DIAGNOSIS — C259 Malignant neoplasm of pancreas, unspecified: Secondary | ICD-10-CM | POA: Diagnosis not present

## 2024-02-21 DIAGNOSIS — C7800 Secondary malignant neoplasm of unspecified lung: Secondary | ICD-10-CM | POA: Diagnosis not present

## 2024-02-21 DIAGNOSIS — J90 Pleural effusion, not elsewhere classified: Secondary | ICD-10-CM | POA: Diagnosis not present

## 2024-02-21 DIAGNOSIS — C7951 Secondary malignant neoplasm of bone: Secondary | ICD-10-CM | POA: Diagnosis not present

## 2024-02-21 DIAGNOSIS — I4891 Unspecified atrial fibrillation: Secondary | ICD-10-CM | POA: Diagnosis not present

## 2024-02-21 DIAGNOSIS — C61 Malignant neoplasm of prostate: Secondary | ICD-10-CM | POA: Diagnosis not present

## 2024-02-21 DIAGNOSIS — C259 Malignant neoplasm of pancreas, unspecified: Secondary | ICD-10-CM | POA: Diagnosis not present

## 2024-02-23 DIAGNOSIS — C259 Malignant neoplasm of pancreas, unspecified: Secondary | ICD-10-CM | POA: Diagnosis not present

## 2024-02-23 DIAGNOSIS — I4891 Unspecified atrial fibrillation: Secondary | ICD-10-CM | POA: Diagnosis not present

## 2024-02-23 DIAGNOSIS — J90 Pleural effusion, not elsewhere classified: Secondary | ICD-10-CM | POA: Diagnosis not present

## 2024-02-23 DIAGNOSIS — C7951 Secondary malignant neoplasm of bone: Secondary | ICD-10-CM | POA: Diagnosis not present

## 2024-02-23 DIAGNOSIS — C7800 Secondary malignant neoplasm of unspecified lung: Secondary | ICD-10-CM | POA: Diagnosis not present

## 2024-02-23 DIAGNOSIS — C61 Malignant neoplasm of prostate: Secondary | ICD-10-CM | POA: Diagnosis not present

## 2024-02-24 DIAGNOSIS — C61 Malignant neoplasm of prostate: Secondary | ICD-10-CM | POA: Diagnosis not present

## 2024-02-24 DIAGNOSIS — C259 Malignant neoplasm of pancreas, unspecified: Secondary | ICD-10-CM | POA: Diagnosis not present

## 2024-02-24 DIAGNOSIS — C7951 Secondary malignant neoplasm of bone: Secondary | ICD-10-CM | POA: Diagnosis not present

## 2024-02-24 DIAGNOSIS — C7800 Secondary malignant neoplasm of unspecified lung: Secondary | ICD-10-CM | POA: Diagnosis not present

## 2024-02-24 DIAGNOSIS — I4891 Unspecified atrial fibrillation: Secondary | ICD-10-CM | POA: Diagnosis not present

## 2024-02-24 DIAGNOSIS — J90 Pleural effusion, not elsewhere classified: Secondary | ICD-10-CM | POA: Diagnosis not present

## 2024-02-25 DIAGNOSIS — C61 Malignant neoplasm of prostate: Secondary | ICD-10-CM | POA: Diagnosis not present

## 2024-02-25 DIAGNOSIS — J90 Pleural effusion, not elsewhere classified: Secondary | ICD-10-CM | POA: Diagnosis not present

## 2024-02-25 DIAGNOSIS — C259 Malignant neoplasm of pancreas, unspecified: Secondary | ICD-10-CM | POA: Diagnosis not present

## 2024-02-25 DIAGNOSIS — C7951 Secondary malignant neoplasm of bone: Secondary | ICD-10-CM | POA: Diagnosis not present

## 2024-02-25 DIAGNOSIS — I959 Hypotension, unspecified: Secondary | ICD-10-CM | POA: Diagnosis not present

## 2024-02-25 DIAGNOSIS — I4891 Unspecified atrial fibrillation: Secondary | ICD-10-CM | POA: Diagnosis not present

## 2024-02-25 DIAGNOSIS — C7800 Secondary malignant neoplasm of unspecified lung: Secondary | ICD-10-CM | POA: Diagnosis not present

## 2024-02-26 DIAGNOSIS — C259 Malignant neoplasm of pancreas, unspecified: Secondary | ICD-10-CM | POA: Diagnosis not present

## 2024-02-26 DIAGNOSIS — C7800 Secondary malignant neoplasm of unspecified lung: Secondary | ICD-10-CM | POA: Diagnosis not present

## 2024-02-26 DIAGNOSIS — I4891 Unspecified atrial fibrillation: Secondary | ICD-10-CM | POA: Diagnosis not present

## 2024-02-26 DIAGNOSIS — J90 Pleural effusion, not elsewhere classified: Secondary | ICD-10-CM | POA: Diagnosis not present

## 2024-02-26 DIAGNOSIS — C7951 Secondary malignant neoplasm of bone: Secondary | ICD-10-CM | POA: Diagnosis not present

## 2024-02-26 DIAGNOSIS — C61 Malignant neoplasm of prostate: Secondary | ICD-10-CM | POA: Diagnosis not present

## 2024-02-27 DIAGNOSIS — I4891 Unspecified atrial fibrillation: Secondary | ICD-10-CM | POA: Diagnosis not present

## 2024-02-27 DIAGNOSIS — C259 Malignant neoplasm of pancreas, unspecified: Secondary | ICD-10-CM | POA: Diagnosis not present

## 2024-02-27 DIAGNOSIS — J90 Pleural effusion, not elsewhere classified: Secondary | ICD-10-CM | POA: Diagnosis not present

## 2024-02-27 DIAGNOSIS — C7800 Secondary malignant neoplasm of unspecified lung: Secondary | ICD-10-CM | POA: Diagnosis not present

## 2024-02-27 DIAGNOSIS — C7951 Secondary malignant neoplasm of bone: Secondary | ICD-10-CM | POA: Diagnosis not present

## 2024-02-27 DIAGNOSIS — C61 Malignant neoplasm of prostate: Secondary | ICD-10-CM | POA: Diagnosis not present

## 2024-02-28 DIAGNOSIS — C7800 Secondary malignant neoplasm of unspecified lung: Secondary | ICD-10-CM | POA: Diagnosis not present

## 2024-02-28 DIAGNOSIS — C61 Malignant neoplasm of prostate: Secondary | ICD-10-CM | POA: Diagnosis not present

## 2024-02-28 DIAGNOSIS — I4891 Unspecified atrial fibrillation: Secondary | ICD-10-CM | POA: Diagnosis not present

## 2024-02-28 DIAGNOSIS — C7951 Secondary malignant neoplasm of bone: Secondary | ICD-10-CM | POA: Diagnosis not present

## 2024-02-28 DIAGNOSIS — C259 Malignant neoplasm of pancreas, unspecified: Secondary | ICD-10-CM | POA: Diagnosis not present

## 2024-02-28 DIAGNOSIS — J90 Pleural effusion, not elsewhere classified: Secondary | ICD-10-CM | POA: Diagnosis not present

## 2024-02-29 DIAGNOSIS — C7800 Secondary malignant neoplasm of unspecified lung: Secondary | ICD-10-CM | POA: Diagnosis not present

## 2024-02-29 DIAGNOSIS — C7951 Secondary malignant neoplasm of bone: Secondary | ICD-10-CM | POA: Diagnosis not present

## 2024-02-29 DIAGNOSIS — I4891 Unspecified atrial fibrillation: Secondary | ICD-10-CM | POA: Diagnosis not present

## 2024-02-29 DIAGNOSIS — C259 Malignant neoplasm of pancreas, unspecified: Secondary | ICD-10-CM | POA: Diagnosis not present

## 2024-02-29 DIAGNOSIS — C61 Malignant neoplasm of prostate: Secondary | ICD-10-CM | POA: Diagnosis not present

## 2024-02-29 DIAGNOSIS — J90 Pleural effusion, not elsewhere classified: Secondary | ICD-10-CM | POA: Diagnosis not present

## 2024-03-07 DEATH — deceased

## 2024-03-08 ENCOUNTER — Ambulatory Visit: Payer: Medicare Other | Admitting: Neurology
